# Patient Record
Sex: Female | Born: 1960 | Race: Black or African American | Hispanic: No | Marital: Single | Smoking: Former smoker
Health system: Southern US, Community
[De-identification: ages and names within clinical notes are randomized; demographics above are authoritative.]

## PROBLEM LIST (undated history)

## (undated) ENCOUNTER — Emergency Department: Discharge status: Absent without leave | Payer: Medicare Other

## (undated) DIAGNOSIS — F32A Depression, unspecified: Secondary | ICD-10-CM

## (undated) DIAGNOSIS — J40 Bronchitis, not specified as acute or chronic: Secondary | ICD-10-CM

## (undated) DIAGNOSIS — J449 Chronic obstructive pulmonary disease, unspecified: Secondary | ICD-10-CM

## (undated) DIAGNOSIS — F329 Major depressive disorder, single episode, unspecified: Secondary | ICD-10-CM

## (undated) DIAGNOSIS — H01009 Unspecified blepharitis unspecified eye, unspecified eyelid: Secondary | ICD-10-CM

## (undated) DIAGNOSIS — F17210 Nicotine dependence, cigarettes, uncomplicated: Secondary | ICD-10-CM

## (undated) DIAGNOSIS — IMO0001 Reserved for inherently not codable concepts without codable children: Secondary | ICD-10-CM

## (undated) DIAGNOSIS — M1990 Unspecified osteoarthritis, unspecified site: Secondary | ICD-10-CM

## (undated) DIAGNOSIS — J45909 Unspecified asthma, uncomplicated: Secondary | ICD-10-CM

## (undated) DIAGNOSIS — K219 Gastro-esophageal reflux disease without esophagitis: Secondary | ICD-10-CM

## (undated) DIAGNOSIS — B20 Human immunodeficiency virus [HIV] disease: Secondary | ICD-10-CM

## (undated) DIAGNOSIS — F40240 Claustrophobia: Secondary | ICD-10-CM

## (undated) DIAGNOSIS — O009 Unspecified ectopic pregnancy without intrauterine pregnancy: Secondary | ICD-10-CM

## (undated) DIAGNOSIS — R059 Cough, unspecified: Secondary | ICD-10-CM

## (undated) DIAGNOSIS — A6000 Herpesviral infection of urogenital system, unspecified: Secondary | ICD-10-CM

## (undated) DIAGNOSIS — J309 Allergic rhinitis, unspecified: Secondary | ICD-10-CM

## (undated) DIAGNOSIS — K922 Gastrointestinal hemorrhage, unspecified: Secondary | ICD-10-CM

## (undated) DIAGNOSIS — R202 Paresthesia of skin: Secondary | ICD-10-CM

## (undated) DIAGNOSIS — R519 Headache, unspecified: Secondary | ICD-10-CM

## (undated) DIAGNOSIS — Z21 Asymptomatic human immunodeficiency virus [HIV] infection status: Secondary | ICD-10-CM

## (undated) DIAGNOSIS — H5789 Other specified disorders of eye and adnexa: Secondary | ICD-10-CM

## (undated) DIAGNOSIS — M5136 Other intervertebral disc degeneration, lumbar region: Secondary | ICD-10-CM

## (undated) DIAGNOSIS — K5790 Diverticulosis of intestine, part unspecified, without perforation or abscess without bleeding: Secondary | ICD-10-CM

## (undated) DIAGNOSIS — I10 Essential (primary) hypertension: Secondary | ICD-10-CM

## (undated) DIAGNOSIS — R51 Headache: Secondary | ICD-10-CM

## (undated) DIAGNOSIS — R05 Cough: Secondary | ICD-10-CM

## (undated) HISTORY — DX: Unspecified asthma, uncomplicated: J45.909

## (undated) HISTORY — PX: JOINT REPLACEMENT: SHX530

## (undated) HISTORY — PX: LAPAROSCOPY FOR ECTOPIC PREGNANCY: SUR765

## (undated) HISTORY — DX: Other specified disorders of eye and adnexa: H57.89

## (undated) HISTORY — PX: HAND SURGERY: SHX662

## (undated) HISTORY — DX: Gastro-esophageal reflux disease without esophagitis: K21.9

## (undated) HISTORY — PX: BUNIONECTOMY: SHX129

## (undated) HISTORY — DX: Allergic rhinitis, unspecified: J30.9

## (undated) HISTORY — PX: DISTAL FEMUR OSTEOTOMY: SHX1467

## (undated) HISTORY — PX: OTHER SURGICAL HISTORY: SHX169

## (undated) HISTORY — DX: Human immunodeficiency virus (HIV) disease: B20

## (undated) HISTORY — DX: Asymptomatic human immunodeficiency virus (hiv) infection status: Z21

## (undated) HISTORY — DX: Essential (primary) hypertension: I10

## (undated) HISTORY — DX: Nicotine dependence, cigarettes, uncomplicated: F17.210

## (undated) HISTORY — PX: KNEE SURGERY: SHX244

## (undated) HISTORY — DX: Unspecified osteoarthritis, unspecified site: M19.90

## (undated) HISTORY — DX: Bronchitis, not specified as acute or chronic: J40

## (undated) HISTORY — PX: TUBAL LIGATION: SHX77

## (undated) HISTORY — DX: Cough, unspecified: R05.9

## (undated) HISTORY — DX: Herpesviral infection of urogenital system, unspecified: A60.00

## (undated) HISTORY — PX: DIAGNOSTIC LAPAROSCOPY: SUR761

## (undated) HISTORY — DX: Unspecified blepharitis unspecified eye, unspecified eyelid: H01.009

## (undated) HISTORY — DX: Cough: R05

## (undated) HISTORY — DX: Paresthesia of skin: R20.2

## (undated) NOTE — *Deleted (*Deleted)
Transition of Care [facility]) - CM/SW Discharge Note   Patient Details  Name: Melanie Bowen MRN: [id number] Date of Birth: 06-05-1961  Transition of Care [facility]) CM/SW Contact:  Victorino Dike, RN Phone Number: 06/12/2020, 10:44 AM   Clinical Narrative:      Patient to DC today to [facility].  Please call report to [phone].  Room #: [id number].  Transport by [facility] No further TOC needs.     Final next level of care: Skilled Nursing Facility Barriers to Discharge: Barriers Resolved   Patient Goals and CMS Choice Patient states their goals for this hospitalization and ongoing recovery are:: I need to go to SNF for rehab. CMS Medicare.gov Compare Post Acute Care list provided to:: Patient Choice offered to / list presented to : Patient  Discharge Placement              Patient chooses bed at: Other - please specify in the comment section below: ([facility]) Patient to be transferred to facility by: [facility] Name of family member notified: Patient and sister Patient and family notified of of transfer: 06/12/20  Discharge Plan and Services In-house Referral: Clinical Social Work Discharge Planning Services: CM Consult                                 Social Determinants of Health (SDOH) Interventions     Readmission Risk Interventions Readmission Risk Prevention Plan 05/23/2020 11/08/2019  Transportation Screening Complete Complete  Medication Review Press photographer) Complete Complete  PCP or Specialist appointment within 3-5 days of discharge (No Data) Not Complete  PCP/Specialist Appt Not Complete comments - Her PCP is out for 2-3 weeks so other two providers are booked out. The receptionist will call her if there is a cancellation.  [place] or Home Care Consult (No Data) Complete  SW Recovery Care/Counseling Consult Complete Complete  Palliative Care Screening Complete Not Applicable  Skilled Nursing Facility Complete Not Applicable  Some recent  data might be hidden

## (undated) NOTE — *Deleted (*Deleted)
Patient received in bed resting. Arousalible to verbal stimuli. MEWS is now

---

## 2002-12-31 HISTORY — PX: REVISION TOTAL KNEE ARTHROPLASTY: SUR1280

## 2003-10-29 ENCOUNTER — Other Ambulatory Visit: Payer: Self-pay

## 2004-12-31 ENCOUNTER — Emergency Department: Payer: Self-pay | Admitting: General Practice

## 2005-05-27 ENCOUNTER — Other Ambulatory Visit: Payer: Self-pay

## 2005-06-07 ENCOUNTER — Inpatient Hospital Stay: Payer: Self-pay | Admitting: General Practice

## 2005-08-26 ENCOUNTER — Encounter: Payer: Self-pay | Admitting: General Practice

## 2005-11-26 ENCOUNTER — Ambulatory Visit: Payer: Self-pay | Admitting: Infectious Diseases

## 2006-04-07 ENCOUNTER — Other Ambulatory Visit: Payer: Self-pay

## 2006-04-07 ENCOUNTER — Ambulatory Visit: Payer: Self-pay | Admitting: Podiatry

## 2006-04-13 ENCOUNTER — Ambulatory Visit: Payer: Self-pay | Admitting: Podiatry

## 2006-08-10 ENCOUNTER — Ambulatory Visit: Payer: Self-pay | Admitting: Infectious Diseases

## 2006-11-01 ENCOUNTER — Inpatient Hospital Stay: Payer: Self-pay | Admitting: Infectious Diseases

## 2006-11-01 ENCOUNTER — Other Ambulatory Visit: Payer: Self-pay

## 2007-01-10 ENCOUNTER — Ambulatory Visit: Payer: Self-pay | Admitting: Infectious Diseases

## 2007-03-04 ENCOUNTER — Emergency Department: Payer: Self-pay

## 2007-03-12 ENCOUNTER — Emergency Department: Payer: Self-pay | Admitting: Emergency Medicine

## 2007-05-24 ENCOUNTER — Ambulatory Visit: Payer: Self-pay | Admitting: Family Medicine

## 2007-06-12 ENCOUNTER — Ambulatory Visit: Payer: Self-pay | Admitting: Family Medicine

## 2007-11-14 ENCOUNTER — Ambulatory Visit: Payer: Self-pay | Admitting: Specialist

## 2008-02-07 ENCOUNTER — Ambulatory Visit: Payer: Self-pay | Admitting: Family Medicine

## 2008-02-08 ENCOUNTER — Ambulatory Visit: Payer: Self-pay | Admitting: Podiatry

## 2008-02-16 ENCOUNTER — Ambulatory Visit: Payer: Self-pay | Admitting: Podiatry

## 2008-06-29 ENCOUNTER — Emergency Department: Payer: Self-pay | Admitting: Emergency Medicine

## 2009-02-10 ENCOUNTER — Ambulatory Visit: Payer: Self-pay | Admitting: Specialist

## 2009-03-05 ENCOUNTER — Ambulatory Visit: Payer: Self-pay | Admitting: General Practice

## 2009-03-19 ENCOUNTER — Ambulatory Visit: Payer: Self-pay | Admitting: Specialist

## 2009-10-21 ENCOUNTER — Emergency Department: Payer: Self-pay | Admitting: Emergency Medicine

## 2010-01-03 ENCOUNTER — Emergency Department: Payer: Self-pay | Admitting: Internal Medicine

## 2010-03-03 ENCOUNTER — Emergency Department: Payer: Self-pay | Admitting: Emergency Medicine

## 2010-03-04 ENCOUNTER — Ambulatory Visit: Payer: Self-pay | Admitting: Family Medicine

## 2010-05-13 ENCOUNTER — Ambulatory Visit: Payer: Self-pay | Admitting: Internal Medicine

## 2010-06-14 ENCOUNTER — Emergency Department: Payer: Self-pay | Admitting: Emergency Medicine

## 2010-12-31 ENCOUNTER — Emergency Department: Payer: Self-pay | Admitting: Emergency Medicine

## 2011-04-08 ENCOUNTER — Ambulatory Visit: Payer: Self-pay | Admitting: Specialist

## 2011-07-07 ENCOUNTER — Ambulatory Visit: Payer: Self-pay | Admitting: Anesthesiology

## 2011-07-09 ENCOUNTER — Ambulatory Visit: Payer: Self-pay | Admitting: Unknown Physician Specialty

## 2011-09-24 ENCOUNTER — Ambulatory Visit: Payer: Self-pay | Admitting: Unknown Physician Specialty

## 2011-09-24 LAB — CREATININE, SERUM
Creatinine: 0.9 mg/dL (ref 0.60–1.30)
EGFR (African American): >60
EGFR (Non-African Amer.): >60

## 2011-09-30 ENCOUNTER — Ambulatory Visit: Payer: Self-pay | Admitting: Unknown Physician Specialty

## 2011-12-22 ENCOUNTER — Ambulatory Visit: Payer: Self-pay | Admitting: Unknown Physician Specialty

## 2011-12-22 LAB — POTASSIUM: Potassium: 3.9 mmol/L (ref 3.5–5.1)

## 2011-12-23 ENCOUNTER — Ambulatory Visit: Payer: Self-pay | Admitting: Unknown Physician Specialty

## 2011-12-23 LAB — PREGNANCY, URINE: Pregnancy Test, Urine: NEGATIVE m[IU]/mL

## 2012-04-25 ENCOUNTER — Ambulatory Visit: Payer: Self-pay | Admitting: Specialist

## 2012-06-02 ENCOUNTER — Ambulatory Visit: Payer: Self-pay | Admitting: General Practice

## 2012-08-08 ENCOUNTER — Ambulatory Visit: Payer: Self-pay | Admitting: Gastroenterology

## 2012-12-25 HISTORY — PX: BACK SURGERY: SHX140

## 2013-04-15 ENCOUNTER — Emergency Department: Payer: Self-pay | Admitting: Emergency Medicine

## 2013-04-15 LAB — COMPREHENSIVE METABOLIC PANEL
Albumin: 3.4 g/dL (ref 3.4–5.0)
Alkaline Phosphatase: 126 U/L (ref 50–136)
Anion Gap: 8 (ref 7–16)
BUN: 19 mg/dL — ABNORMAL HIGH (ref 7–18)
Bilirubin,Total: 0.2 mg/dL (ref 0.2–1.0)
Calcium, Total: 8.7 mg/dL (ref 8.5–10.1)
Chloride: 110 mmol/L — ABNORMAL HIGH (ref 98–107)
Co2: 22 mmol/L (ref 21–32)
Creatinine: 1.12 mg/dL (ref 0.60–1.30)
EGFR (African American): >60
EGFR (Non-African Amer.): 57 — ABNORMAL LOW
Glucose: 103 mg/dL — ABNORMAL HIGH (ref 65–99)
Osmolality: 282 (ref 275–301)
Potassium: 3.9 mmol/L (ref 3.5–5.1)
SGOT(AST): 23 U/L (ref 15–37)
SGPT (ALT): 23 U/L (ref 12–78)
Sodium: 140 mmol/L (ref 136–145)
Total Protein: 7.1 g/dL (ref 6.4–8.2)

## 2013-04-15 LAB — CK TOTAL AND CKMB (NOT AT ARMC)
CK, Total: 241 U/L — ABNORMAL HIGH (ref 21–215)
CK-MB: 1.3 ng/mL (ref 0.5–3.6)

## 2013-04-15 LAB — CBC
HCT: 40 % (ref 35.0–47.0)
HGB: 13.6 g/dL (ref 12.0–16.0)
MCH: 32.5 pg (ref 26.0–34.0)
MCHC: 34 g/dL (ref 32.0–36.0)
MCV: 96 fL (ref 80–100)
Platelet: 296 10*3/uL (ref 150–440)
RBC: 4.19 10*6/uL (ref 3.80–5.20)
RDW: 13.6 % (ref 11.5–14.5)
WBC: 7.8 10*3/uL (ref 3.6–11.0)

## 2013-04-15 LAB — TROPONIN I: Troponin-I: <0.02 ng/mL

## 2013-04-16 LAB — TROPONIN I: Troponin-I: <0.02 ng/mL

## 2013-04-26 ENCOUNTER — Ambulatory Visit: Payer: Self-pay | Admitting: Family Medicine

## 2013-08-25 ENCOUNTER — Emergency Department: Payer: Self-pay | Admitting: Emergency Medicine

## 2014-01-25 ENCOUNTER — Ambulatory Visit: Payer: Self-pay | Admitting: General Practice

## 2014-01-25 HISTORY — PX: FRACTURE SURGERY: SHX138

## 2014-03-01 ENCOUNTER — Emergency Department: Payer: Self-pay | Admitting: Emergency Medicine

## 2014-03-14 ENCOUNTER — Encounter: Payer: Self-pay | Admitting: Nurse Practitioner

## 2014-03-30 ENCOUNTER — Encounter: Payer: Self-pay | Admitting: Nurse Practitioner

## 2014-04-10 DIAGNOSIS — M51369 Other intervertebral disc degeneration, lumbar region without mention of lumbar back pain or lower extremity pain: Secondary | ICD-10-CM

## 2014-04-10 DIAGNOSIS — M5136 Other intervertebral disc degeneration, lumbar region: Secondary | ICD-10-CM

## 2014-04-10 HISTORY — DX: Other intervertebral disc degeneration, lumbar region: M51.36

## 2014-04-10 HISTORY — DX: Other intervertebral disc degeneration, lumbar region without mention of lumbar back pain or lower extremity pain: M51.369

## 2014-04-29 ENCOUNTER — Ambulatory Visit: Payer: Self-pay | Admitting: Family Medicine

## 2014-04-30 ENCOUNTER — Encounter: Payer: Self-pay | Admitting: Nurse Practitioner

## 2014-05-13 ENCOUNTER — Emergency Department: Payer: Self-pay | Admitting: Emergency Medicine

## 2014-05-19 ENCOUNTER — Emergency Department: Payer: Self-pay | Admitting: Internal Medicine

## 2014-05-19 LAB — URINALYSIS, COMPLETE
Bacteria: NONE SEEN
Bilirubin,UR: NEGATIVE
Glucose,UR: NEGATIVE mg/dL (ref 0–75)
Ketone: NEGATIVE
Leukocyte Esterase: NEGATIVE
Nitrite: NEGATIVE
Ph: 5 (ref 4.5–8.0)
Protein: NEGATIVE
RBC,UR: <1 /HPF (ref 0–5)
Specific Gravity: 1.02 (ref 1.003–1.030)
Squamous Epithelial: <1
WBC UR: <1 /HPF (ref 0–5)

## 2014-05-30 ENCOUNTER — Encounter: Payer: Self-pay | Admitting: Nurse Practitioner

## 2014-06-16 ENCOUNTER — Emergency Department: Payer: Self-pay | Admitting: Emergency Medicine

## 2014-07-14 ENCOUNTER — Emergency Department: Payer: Self-pay | Admitting: Emergency Medicine

## 2014-08-20 ENCOUNTER — Ambulatory Visit: Payer: Self-pay | Admitting: General Practice

## 2014-10-20 ENCOUNTER — Emergency Department: Payer: Self-pay | Admitting: Emergency Medicine

## 2014-10-30 ENCOUNTER — Institutional Professional Consult (permissible substitution): Payer: Self-pay | Admitting: Internal Medicine

## 2014-11-01 ENCOUNTER — Ambulatory Visit: Payer: Self-pay | Admitting: Gastroenterology

## 2014-11-12 ENCOUNTER — Encounter: Payer: Self-pay | Admitting: Internal Medicine

## 2014-11-12 ENCOUNTER — Ambulatory Visit (INDEPENDENT_AMBULATORY_CARE_PROVIDER_SITE_OTHER): Payer: Medicare Other | Admitting: Internal Medicine

## 2014-11-12 VITALS — BP 128/84 | HR 94 | Temp 97.9°F | Ht 66.0 in | Wt <= 1120 oz

## 2014-11-12 DIAGNOSIS — J4540 Moderate persistent asthma, uncomplicated: Secondary | ICD-10-CM

## 2014-11-12 DIAGNOSIS — J45909 Unspecified asthma, uncomplicated: Secondary | ICD-10-CM | POA: Insufficient documentation

## 2014-11-12 NOTE — Progress Notes (Signed)
Date: 11/12/2014  MRN# [id number] Melanie Bowen June 19, 1961  Referring Physician: FNP Dow Adolph is a 54 y.o. old female seen in consultation for cough\sob.  CC:  Chief Complaint  Patient presents with  . Advice Only    Asthma: wheezing; SOB at times, CP, chest tightness     HPI:  She is a pleasant 54 year old female past medical history of asthma, HIV, in consultation today for recurrent bronchitis/cough/orders of breath. Patient states that she's had recurrent upper respiratory tract infections over the past 2 months, this accompanied with cough (brown to clear productive sputum). Recently she went to the ED for visit due to cough and shortness of breath, she also had a chest x-ray at that time which was very 21st 2016, patient is getting treatment at that time due to being in the waiting room to long and left before treatment was given. Over the past months she's been using albuterol 2 times a day and nebulizer 2-3 times a day. Patient states that she is currently on Advair but she only uses it 3-4 days per week, she denies any intubation for respiratory issues in the past. States that she has about 4 episodes a year of "bronchitis" requiring steroids. She currently smokes half pack to a quarter pack per day for the past 40 years, she quits intermittently for months at a time. In the past she has used cocaine but currently denies any current use. No pets at home, previously worked in a [employer].   Review of records (personally reviewed by Dr. Edwin Dada time spent reviewing 30 minutes) Office visit 10/21/2014-recently completed antibiotics and prednisone 5 day course still feeling chest pressure, shortness of breath, wheezing, using inhalers daily, using nebulizers twice a day, no fever, still smoking. Plan prednisone taper, nicotine patch, referral to pulmonary. Office visit 10/15/2014-presented with complaints of cough, rhinorrhea, sinus pressure, right ear pain,  wheezing, body aches and malaise, purulent sputum, cough, shortness of breath, still smoking. Plan Levaquin, steroid burst, DuoNeb's U4 to 6 hours while awake for the next few days, continue Advair. Office visit 09/11/2014-complaints of cough, recently completed antibiotics and prednisone, still with some shortness of breath and wheezing, however this is improved, sputum is decrease. Plan quit smoking, humidify prescription, DuoNeb solution, start Advair.  PMHX:   Past Medical History  Diagnosis Date  . Asthma   . Cough   . Genital herpes   . HIV (human immunodeficiency virus infection)   . Bronchitis   . Arthritis   . GERD (gastroesophageal reflux disease)   . Eye irritation   . Paresthesia of hand, bilateral   . Blepharitis   . Allergic rhinitis   . Hypertension   . Cigarette smoker    Surgical Hx:  Past Surgical History  Procedure Laterality Date  . Knee surgery    . Hand surgery    . Tubal ligation    . Laparoscopy for ectopic pregnancy     Family Hx:  History reviewed. No pertinent family history. Social Hx:   History  Substance Use Topics  . Smoking status: Current Every Day Smoker    Types: Cigarettes  . Smokeless tobacco: Never Used  . Alcohol Use: 0.0 oz/week    0 Standard drinks or equivalent per week   Medication:   Current Outpatient Rx  Name  Route  Sig  Dispense  Refill  . albuterol (PROVENTIL HFA;VENTOLIN HFA) 108 (90 BASE) MCG/ACT inhaler   Inhalation   Inhale 90 mcg into the lungs  every 6 (six) hours.         Marland Kitchen amLODipine (NORVASC) 10 MG tablet   Oral   Take 10 mg by mouth daily.         Marland Kitchen efavirenz-emtricitabine-tenofovir (ATRIPLA) 600-200-300 MG per tablet   Oral   Take 1 tablet by mouth at bedtime.         Marland Kitchen etodolac (LODINE) 500 MG tablet   Oral   Take 500 mg by mouth 2 (two) times daily.         . fluticasone (FLONASE) 50 MCG/ACT nasal spray   Nasal   Place 50 g into the nose daily.         . Fluticasone-Salmeterol (ADVAIR)  500-50 MCG/DOSE AEPB   Inhalation   Inhale 500 mcg into the lungs every 4 (four) hours.         . hydrochlorothiazide (HYDRODIURIL) 25 MG tablet   Oral   Take 25 mg by mouth daily.         Marland Kitchen HYDROcodone-acetaminophen (NORCO) 10-325 MG per tablet   Oral   Take 1 tablet by mouth every 12 (twelve) hours as needed.      0   . ibuprofen (ADVIL,MOTRIN) 600 MG tablet   Oral   Take 600 mg by mouth as needed.         Marland Kitchen ipratropium-albuterol (DUONEB) 0.5-2.5 (3) MG/3ML SOLN   Inhalation   Inhale 3 mLs into the lungs 3 (three) times daily as needed.         Marland Kitchen losartan (COZAAR) 50 MG tablet   Oral   Take 50 mg by mouth daily.         Marland Kitchen omeprazole (PRILOSEC) 40 MG capsule   Oral   Take 40 mg by mouth daily.         . promethazine (PHENERGAN) 25 MG tablet   Oral   Take 25 mg by mouth as needed.         . valACYclovir (VALTREX) 1000 MG tablet   Oral   Take 1,000 mg by mouth daily as needed.             Allergies:  Gabapentin and Zanaflex   Review of Systems: Gen:  Denies  fever, sweats, chills HEENT: Denies blurred vision, double vision, ear pain, eye pain, hearing loss, nose bleeds, sore throat Cvc:  No dizziness, chest pain or heaviness Resp:   Denies cough or sputum porduction, shortness of breath Gi: Denies swallowing difficulty, stomach pain, nausea or vomiting, diarrhea, constipation, bowel incontinence Gu:  Denies bladder incontinence, burning urine Ext:   No Joint pain, stiffness or swelling Skin: No skin rash, easy bruising or bleeding or hives Endoc:  No polyuria, polydipsia , polyphagia or weight change Psych: No depression, insomnia or hallucinations  Other:  All other systems negative  Physical Examination:   VS: BP 128/84 mmHg  Pulse 94  Temp(Src) 97.9 F (36.6 C) (Oral)  Ht 5\' 6"  (1.676 m)  Wt 27 lb (12.247 kg)  BMI 4.36 kg/m2  SpO2 96%  General Appearance: No distress  Neuro:without focal findings, mental status, speech normal,  alert and oriented, cranial nerves 2-12 intact, reflexes normal and symmetric, sensation grossly normal  HEENT: PERRLA, EOM intact, no ptosis, no other lesions noticed; Mallampati 2 Pulmonary: Coarse upper airway sounds, decreased breath sounds at the bilateral bases, diaphragmatic excursion normal.No wheezing, No rales;   Sputum Production:  None  CardiovascularNormal S1,S2.  No m/r/g.  Abdominal aorta pulsation normal.  Abdomen: Benign, Soft, non-tender, No masses, hepatosplenomegaly, No lymphadenopathy Renal:  No costovertebral tenderness  GU:  No performed at this time. Endoc: No evident thyromegaly, no signs of acromegaly or Cushing features Skin:   warm, no rashes, no ecchymosis  Extremities: normal, no cyanosis, clubbing, no edema, warm with normal capillary refill. Other findings: None   Rad results: (The following images and results were reviewed by Dr. Stevenson Clinch). CXR 10/20/14 FINDINGS: The cardiac silhouette, mediastinal and hilar contours are normal and stable. Mild bronchitic changes with slight peribronchial thickening and slight increased interstitial markings may suggest bronchitis or reactive airways disease. No infiltrates or effusions. The bony thorax is intact.  IMPRESSION: Mild bronchitic changes could suggest bronchitis or interstitial pneumonitis. No focal infiltrates.  CXR 06/16/14 COMPARISON: 05/13/2014  FINDINGS: The heart size and mediastinal contours are within normal limits. Both lungs are clear. The visualized skeletal structures are unremarkable.  IMPRESSION: No active cardiopulmonary disease.  Assessment and Plan:  54 year old female past medical history of asthma, HIV, tobacco abuse, seen in consultation for recurrent upper respiratory tract infections and cough. Asthma, chronic Currently her asthma is not controlled well, she is requiring multiple rounds of antibiotics and steroids.  I believe that her continued smoking is triggering bronchospasms  along with recurrent breast 3 tract infections. Today I counseled the patient heavily on quit tobacco and current effects it can have on her current respiratory status.  Also today, consultation heavily on need and use of a rescue nebulizer and rescue inhaler along with his current indications and administrations. She does have a history of HIV, currently following with infectious disease, has not seen an infectious disease in over 1 year.  Plan: - pulmonary function testing and 6 minute walk test prior to follow up - albuterol inhaler - 2puff every 3-4 hours as needed for shortness of breath\wheezing\recurrent cough - continue Advair Diskus - 1 puff twice a day (rinse and gargle after each use). - If patient continues to have a upper respiratory tract infections, bronchitis, will need to consider bronchoscopy with BAL, this can be discussed at her follow-up visit     Updated Medication List Outpatient Encounter Prescriptions as of 11/12/2014  Medication Sig  . albuterol (PROVENTIL HFA;VENTOLIN HFA) 108 (90 BASE) MCG/ACT inhaler Inhale 90 mcg into the lungs every 6 (six) hours.  Marland Kitchen amLODipine (NORVASC) 10 MG tablet Take 10 mg by mouth daily.  Marland Kitchen efavirenz-emtricitabine-tenofovir (ATRIPLA) 600-200-300 MG per tablet Take 1 tablet by mouth at bedtime.  Marland Kitchen etodolac (LODINE) 500 MG tablet Take 500 mg by mouth 2 (two) times daily.  . fluticasone (FLONASE) 50 MCG/ACT nasal spray Place 50 g into the nose daily.  . Fluticasone-Salmeterol (ADVAIR) 500-50 MCG/DOSE AEPB Inhale 500 mcg into the lungs every 4 (four) hours.  . hydrochlorothiazide (HYDRODIURIL) 25 MG tablet Take 25 mg by mouth daily.  Marland Kitchen HYDROcodone-acetaminophen (NORCO) 10-325 MG per tablet Take 1 tablet by mouth every 12 (twelve) hours as needed.  Marland Kitchen ibuprofen (ADVIL,MOTRIN) 600 MG tablet Take 600 mg by mouth as needed.  Marland Kitchen ipratropium-albuterol (DUONEB) 0.5-2.5 (3) MG/3ML SOLN Inhale 3 mLs into the lungs 3 (three) times daily as needed.  Marland Kitchen  losartan (COZAAR) 50 MG tablet Take 50 mg by mouth daily.  Marland Kitchen omeprazole (PRILOSEC) 40 MG capsule Take 40 mg by mouth daily.  . promethazine (PHENERGAN) 25 MG tablet Take 25 mg by mouth as needed.  . valACYclovir (VALTREX) 1000 MG tablet Take 1,000 mg by mouth daily as needed.    Orders for  this visit: No orders of the defined types were placed in this encounter.     Thank  you for the consultation and for allowing [facility] to assist in the care of your patient. Our recommendations are noted above.  Please contact us if we can be of further service.   Vilinda Boehringer, MD [facility] Office Number: [phone]

## 2014-11-12 NOTE — Patient Instructions (Signed)
Follow up with Dr. Stevenson Clinch in 1 month - pulmonary function testing and 6 minute walk test prior to follow up - albuterol inhaler - 2puff every 3-4 hours as needed for shortness of breath\wheezing\recurrent cough - continue Advair Diskus - 1 puff twice a day (rinse and gargle after each use).

## 2014-11-19 ENCOUNTER — Other Ambulatory Visit: Payer: Self-pay | Admitting: Neurosurgery

## 2014-11-26 NOTE — Assessment & Plan Note (Signed)
Currently her asthma is not controlled well, she is requiring multiple rounds of antibiotics and steroids.  I believe that her continued smoking is triggering bronchospasms along with recurrent breast 3 tract infections. Today I counseled the patient heavily on quit tobacco and current effects it can have on her current respiratory status.  Also today, consultation heavily on need and use of a rescue nebulizer and rescue inhaler along with his current indications and administrations. She does have a history of HIV, currently following with infectious disease, has not seen an infectious disease in over 1 year.  Plan: - pulmonary function testing and 6 minute walk test prior to follow up - albuterol inhaler - 2puff every 3-4 hours as needed for shortness of breath\wheezing\recurrent cough - continue Advair Diskus - 1 puff twice a day (rinse and gargle after each use). - If patient continues to have a upper respiratory tract infections, bronchitis, will need to consider bronchoscopy with BAL, this can be discussed at her follow-up visit

## 2014-12-06 ENCOUNTER — Ambulatory Visit
Admit: 2014-12-06 | Disposition: A | Discharge status: Home / Self Care | Payer: Self-pay | Attending: Gastroenterology | Admitting: Gastroenterology

## 2014-12-06 HISTORY — PX: COLONOSCOPY: SHX174

## 2014-12-14 ENCOUNTER — Emergency Department
Admit: 2014-12-14 | Disposition: A | Discharge status: Home / Self Care | Payer: Self-pay | Admitting: Emergency Medicine

## 2014-12-14 LAB — CBC WITH DIFFERENTIAL/PLATELET
Basophil #: 0.1 10*3/uL (ref 0.0–0.1)
Basophil %: 1.1 %
Eosinophil #: 0.7 10*3/uL (ref 0.0–0.7)
Eosinophil %: 9.5 %
HCT: 41.7 % (ref 35.0–47.0)
HGB: 13.9 g/dL (ref 12.0–16.0)
Lymphocyte #: 2.3 10*3/uL (ref 1.0–3.6)
Lymphocyte %: 29.9 %
MCH: 33.3 pg (ref 26.0–34.0)
MCHC: 33.4 g/dL (ref 32.0–36.0)
MCV: 100 fL (ref 80–100)
Monocyte #: 0.8 x10 3/mm (ref 0.2–0.9)
Monocyte %: 9.6 %
Neutrophil #: 3.9 10*3/uL (ref 1.4–6.5)
Neutrophil %: 49.9 %
Platelet: 350 10*3/uL (ref 150–440)
RBC: 4.18 10*6/uL (ref 3.80–5.20)
RDW: 13.6 % (ref 11.5–14.5)
WBC: 7.8 10*3/uL (ref 3.6–11.0)

## 2014-12-14 LAB — COMPREHENSIVE METABOLIC PANEL
Albumin: 3.7 g/dL
Alkaline Phosphatase: 116 U/L
Anion Gap: 8 (ref 7–16)
BUN: 19 mg/dL
Bilirubin,Total: 0.3 mg/dL
Calcium, Total: 8.8 mg/dL — ABNORMAL LOW
Chloride: 109 mmol/L
Co2: 24 mmol/L
Creatinine: 1.35 mg/dL — ABNORMAL HIGH
EGFR (African American): 52 — ABNORMAL LOW
EGFR (Non-African Amer.): 45 — ABNORMAL LOW
Glucose: 112 mg/dL — ABNORMAL HIGH
Potassium: 4 mmol/L
SGOT(AST): 31 U/L
SGPT (ALT): 22 U/L
Sodium: 141 mmol/L
Total Protein: 7 g/dL

## 2014-12-14 LAB — TROPONIN I: Troponin-I: <0.03 ng/mL

## 2014-12-20 ENCOUNTER — Encounter (HOSPITAL_COMMUNITY)
Admission: RE | Admit: 2014-12-20 | Discharge: 2014-12-20 | Disposition: A | Discharge status: Home / Self Care | Payer: Medicare Other | Source: Ambulatory Visit | Attending: Neurosurgery | Admitting: Neurosurgery

## 2014-12-20 ENCOUNTER — Encounter (HOSPITAL_COMMUNITY): Payer: Self-pay

## 2014-12-20 DIAGNOSIS — Z01818 Encounter for other preprocedural examination: Secondary | ICD-10-CM | POA: Diagnosis present

## 2014-12-20 HISTORY — DX: Depression, unspecified: F32.A

## 2014-12-20 HISTORY — DX: Reserved for inherently not codable concepts without codable children: IMO0001

## 2014-12-20 HISTORY — DX: Headache, unspecified: R51.9

## 2014-12-20 HISTORY — DX: Headache: R51

## 2014-12-20 HISTORY — DX: Major depressive disorder, single episode, unspecified: F32.9

## 2014-12-20 LAB — CBC WITH DIFFERENTIAL/PLATELET
Basophils Absolute: 0.1 10*3/uL (ref 0.0–0.1)
Basophils Relative: 0 % (ref 0–1)
Eosinophils Absolute: 0.4 10*3/uL (ref 0.0–0.7)
Eosinophils Relative: 3 % (ref 0–5)
HCT: 42.9 % (ref 36.0–46.0)
Hemoglobin: 14.3 g/dL (ref 12.0–15.0)
Lymphocytes Relative: 30 % (ref 12–46)
Lymphs Abs: 4 10*3/uL (ref 0.7–4.0)
MCH: 32.9 pg (ref 26.0–34.0)
MCHC: 33.3 g/dL (ref 30.0–36.0)
MCV: 98.8 fL (ref 78.0–100.0)
Monocytes Absolute: 1.1 10*3/uL — ABNORMAL HIGH (ref 0.1–1.0)
Monocytes Relative: 8 % (ref 3–12)
Neutro Abs: 7.9 10*3/uL — ABNORMAL HIGH (ref 1.7–7.7)
Neutrophils Relative %: 59 % (ref 43–77)
Platelets: 350 10*3/uL (ref 150–400)
RBC: 4.34 MIL/uL (ref 3.87–5.11)
RDW: 13.8 % (ref 11.5–15.5)
WBC: 13.5 10*3/uL — ABNORMAL HIGH (ref 4.0–10.5)

## 2014-12-20 LAB — BASIC METABOLIC PANEL
Anion gap: 10 (ref 5–15)
BUN: 15 mg/dL (ref 6–23)
CO2: 24 mmol/L (ref 19–32)
Calcium: 8.9 mg/dL (ref 8.4–10.5)
Chloride: 102 mmol/L (ref 96–112)
Creatinine, Ser: 1.19 mg/dL — ABNORMAL HIGH (ref 0.50–1.10)
GFR calc Af Amer: 60 mL/min — ABNORMAL LOW (ref >90)
GFR calc non Af Amer: 52 mL/min — ABNORMAL LOW (ref >90)
Glucose, Bld: 96 mg/dL (ref 70–99)
Potassium: 3.7 mmol/L (ref 3.5–5.1)
Sodium: 136 mmol/L (ref 135–145)

## 2014-12-20 LAB — SURGICAL PCR SCREEN
MRSA, PCR: NEGATIVE
Staphylococcus aureus: NEGATIVE

## 2014-12-20 LAB — ABO/RH: ABO/RH(D): O POS

## 2014-12-20 LAB — TYPE AND SCREEN
ABO/RH(D): O POS
Antibody Screen: NEGATIVE

## 2014-12-20 NOTE — Pre-Procedure Instructions (Signed)
TALISSA LAHTI  12/20/2014   Your procedure is scheduled on:  Tuesday, May 3.  Report to [facility] Admitting at 6:00 AM.   Call this number if you have problems the morning of surgery: [phone]              For any other questions, please call [phone], Monday - Friday 8 AM - 4 PM.   Remember:   Do not eat food or drink liquids after midnight Monday, May 2.   Take these medicines the morning of surgery with A SIP OF WATER: amLODipine (NORVASC), omeprazole (PRILOSEC).  Use Fluticasone-Salmeterol (ADVAIR).                Take if needed: valACYclovir (VALTREX), promethazine (PHENERGAN),  HYDROcodone-acetaminophen (NORCO). Use nasal and rescue inhalers as needed and please bring rescue inhalers to the hospital with you.   Do not wear jewelry, make-up or nail polish.  Do not wear lotions, powders, or perfumes.   Do not shave 48 hours prior to surgery.   Do not bring valuables to the hospital.             [facility] is not responsible for any belongings or valuables.               Contacts, dentures or bridgework may not be worn into surgery.  Leave suitcase in the car. After surgery it may be brought to your room.  For patients admitted to the hospital, discharge time is determined by your treatment team.                Special Instructions: Review  [place] - Preparing For Surgery.   Please read over the following fact sheets that you were given: Pain Booklet, Coughing and Deep Breathing, Blood Transfusion Information and Surgical Site Infection Prevention

## 2014-12-20 NOTE — Progress Notes (Signed)
Melanie Bowen was seen at [facility] on Sat 12/14/14, with complaint of right sided chest pain and cough.  Pain is 8 radiates from mid right back to right breast.  States it is a sharp pain.  Patient states that she was diagnosed with Acute Bronchitis.  Patient was treated with Prednisone dose pack and 3 days of Antibiotics.   Patient is scheduled to have a "lung study"  at [facility] 12/26/14.Patient did report that coughing has decreased, but pain is the same.  I have requested information from [facility], ED visit and [facility] for EKGs, any cardiac studies, chest xrays and last office visit.

## 2014-12-21 ENCOUNTER — Emergency Department
Admit: 2014-12-21 | Disposition: A | Discharge status: Home / Self Care | Payer: Self-pay | Admitting: Emergency Medicine

## 2014-12-21 LAB — BASIC METABOLIC PANEL
Anion Gap: 7 (ref 7–16)
BUN: 16 mg/dL
Calcium, Total: 8.4 mg/dL — ABNORMAL LOW
Chloride: 108 mmol/L
Co2: 24 mmol/L
Creatinine: 0.96 mg/dL
EGFR (African American): >60
EGFR (Non-African Amer.): >60
Glucose: 85 mg/dL
Potassium: 3.6 mmol/L
Sodium: 139 mmol/L

## 2014-12-21 LAB — CBC
HCT: 42.7 % (ref 35.0–47.0)
HGB: 14.2 g/dL (ref 12.0–16.0)
MCH: 33.1 pg (ref 26.0–34.0)
MCHC: 33.2 g/dL (ref 32.0–36.0)
MCV: 100 fL (ref 80–100)
Platelet: 330 10*3/uL (ref 150–440)
RBC: 4.29 10*6/uL (ref 3.80–5.20)
RDW: 14 % (ref 11.5–14.5)
WBC: 7.7 10*3/uL (ref 3.6–11.0)

## 2014-12-21 LAB — TROPONIN I: Troponin-I: <0.03 ng/mL

## 2014-12-21 NOTE — Op Note (Signed)
PATIENT NAME:  Melanie Bowen, Melanie Bowen MR#:  [id number] DATE OF BIRTH:  27-Sep-1960  DATE OF PROCEDURE:  01/25/2014  PREOPERATIVE DIAGNOSIS:  Fracture of the base of the right little metacarpal.   POSTOPERATIVE DIAGNOSIS:  Fracture of the base of the right little metacarpal.   PROCEDURE PERFORMED:  Closed reduction and percutaneous pinning of the fracture of the base of the right little metacarpal.   SURGEON:  Skip Estimable, M.D.   ANESTHESIA:  General.   ESTIMATED BLOOD LOSS:  Minimal.   TOURNIQUET TIME:  Not used.   DRAINS:  None.   IMPLANTS UTILIZED:  1.2 mm K wires x 2.   INDICATIONS FOR SURGERY:  The patient is a 54 year old female who has been seen for complaints of right hand pain.  X-ray demonstrated fracture of the base of the right fifth metacarpal with displacement.  After discussion of the risks and benefits of surgical intervention, the patient expressed understanding of the risks, benefits, and agreed with plans for surgical intervention.   PROCEDURE IN DETAIL:  The patient was brought into the Operating Room and, after adequate general anesthesia was achieved, a tourniquet was placed on the patient's upper right arm.  The patient's right hand and arm were cleaned and prepped with alcohol and DuraPrep draped in the usual sterile fashion.  A "timeout" was performed as per usual protocol.  The fracture was attempted to be reduced manually, but there was still slight offset noted.  A Synthes bone reduction forceps with points was positioned through percutaneous incisions so as to reduce the fracture and provisionally stabilize the fracture site.  Good position was noted in AP and lateral planes.  A K wire was inserted through the shaft of the little metacarpal transversely and then engaged into the ring metacarpal for provisional distal stability.  A second K wire was inserted from the base of the little metacarpal and advanced distally across the fracture site so as to engage the proximal  and distal portions of the fracture site.  The bone reduction forceps was removed and good maintenance of the reduction was appreciated in multiple views using the FluoroScan.  The K wires were cut using wire cutters and Jurgens balls were placed so as to protect the tips of the K wires.  A well-padded dressing was applied followed by application of an ulnar gutter splint.   The patient tolerated the procedure well.  She was transported to the recovery room in stable condition.     ____________________________ Laurice Record. Holley Bouche., MD jph:ea D: 01/26/2014 14:31:06 ET T: 01/27/2014 00:40:38 ET JOB#: [id number]  cc: Laurice Record. Holley Bouche., MD, <Dictator> JAMES P Holley Bouche MD ELECTRONICALLY SIGNED 01/27/2014 8:52

## 2014-12-23 LAB — SURGICAL PATHOLOGY

## 2014-12-23 NOTE — Progress Notes (Addendum)
Anesthesia Chart Review:  Pt is 54 year old female scheduled for L3-4, L4-5 PLIF on 12/31/2014 with Dr. Annette Stable.   PMH includes: HTN, HIV, asthma. Current smoker. BMI 44.5.   Seen in ER at [facility] on 12/14/2014 for R sided chest pain and cough. Dx with bronchitis, tx with prednisone and z-pak.   Pt seen again in ER at [facility] on 12/21/14 for R sided chest pain. Diagnosed with chest wall pain. Given ibuprofen.   Chest x-ray reviewed. No acute cardiopulmonary disease.   EKG 12/14/2014 from ER visit: Sinus tachycardia (101 bpm). Septal infarct, age undetermined. Appears unchanged when compared to tracing dated 01/04/2014.   Had another EKG 12/21/14 at 2nd ER visit: NSR, septal infarct, age undetermined. Copy quality is poor, but other than decrease in rate, appears unchanged from 12/14/14.   If no changes, I anticipate pt can proceed with surgery as scheduled.   Mykenzi Strebe, FNP-BC [facility] Short Stay Surgical Center/Anesthesiology Phone: [phone] 12/24/2014 1:33 PM

## 2014-12-26 ENCOUNTER — Telehealth: Payer: Self-pay | Admitting: *Deleted

## 2014-12-26 ENCOUNTER — Ambulatory Visit: Payer: Medicare Other

## 2014-12-26 ENCOUNTER — Ambulatory Visit: Payer: Medicare Other | Admitting: Internal Medicine

## 2014-12-26 ENCOUNTER — Encounter: Payer: Self-pay | Admitting: *Deleted

## 2014-12-26 DIAGNOSIS — J4540 Moderate persistent asthma, uncomplicated: Secondary | ICD-10-CM

## 2014-12-26 NOTE — Telephone Encounter (Signed)
PFT order placed

## 2014-12-29 HISTORY — PX: BACK SURGERY: SHX140

## 2014-12-30 MED ORDER — DEXAMETHASONE SODIUM PHOSPHATE 10 MG/ML IJ SOLN
10.0000 mg | INTRAMUSCULAR | Status: AC
  Administered 2014-12-31: 10 mg via INTRAVENOUS
  Filled 2014-12-30: qty 1

## 2014-12-30 MED ORDER — CEFAZOLIN SODIUM-DEXTROSE 2-3 GM-% IV SOLR
2.0000 g | INTRAVENOUS | Status: AC
  Administered 2014-12-31: 2 g via INTRAVENOUS
  Filled 2014-12-30: qty 50

## 2014-12-31 ENCOUNTER — Inpatient Hospital Stay (HOSPITAL_COMMUNITY): Payer: Medicare Other

## 2014-12-31 ENCOUNTER — Encounter (HOSPITAL_COMMUNITY): Payer: Self-pay | Admitting: Anesthesiology

## 2014-12-31 ENCOUNTER — Inpatient Hospital Stay (HOSPITAL_COMMUNITY)
Admission: RE | Admit: 2014-12-31 | Discharge: 2015-01-02 | DRG: 460 | Disposition: A | Discharge status: Home Health | Payer: Medicare Other | Source: Ambulatory Visit | Attending: Neurosurgery | Admitting: Neurosurgery

## 2014-12-31 ENCOUNTER — Inpatient Hospital Stay (HOSPITAL_COMMUNITY): Payer: Medicare Other | Admitting: Vascular Surgery

## 2014-12-31 ENCOUNTER — Inpatient Hospital Stay (HOSPITAL_COMMUNITY): Payer: Medicare Other | Admitting: Anesthesiology

## 2014-12-31 ENCOUNTER — Encounter (HOSPITAL_COMMUNITY)
Admission: RE | Disposition: A | Discharge status: Home Health | Payer: Self-pay | Source: Ambulatory Visit | Attending: Neurosurgery

## 2014-12-31 DIAGNOSIS — F17210 Nicotine dependence, cigarettes, uncomplicated: Secondary | ICD-10-CM | POA: Diagnosis present

## 2014-12-31 DIAGNOSIS — K219 Gastro-esophageal reflux disease without esophagitis: Secondary | ICD-10-CM | POA: Diagnosis present

## 2014-12-31 DIAGNOSIS — Z6841 Body Mass Index (BMI) 40.0 and over, adult: Secondary | ICD-10-CM | POA: Diagnosis not present

## 2014-12-31 DIAGNOSIS — Z7951 Long term (current) use of inhaled steroids: Secondary | ICD-10-CM | POA: Diagnosis not present

## 2014-12-31 DIAGNOSIS — Z96653 Presence of artificial knee joint, bilateral: Secondary | ICD-10-CM | POA: Diagnosis present

## 2014-12-31 DIAGNOSIS — I10 Essential (primary) hypertension: Secondary | ICD-10-CM | POA: Diagnosis present

## 2014-12-31 DIAGNOSIS — Z21 Asymptomatic human immunodeficiency virus [HIV] infection status: Secondary | ICD-10-CM | POA: Diagnosis present

## 2014-12-31 DIAGNOSIS — M4310 Spondylolisthesis, site unspecified: Secondary | ICD-10-CM | POA: Diagnosis present

## 2014-12-31 DIAGNOSIS — A6000 Herpesviral infection of urogenital system, unspecified: Secondary | ICD-10-CM | POA: Diagnosis present

## 2014-12-31 DIAGNOSIS — M51369 Other intervertebral disc degeneration, lumbar region without mention of lumbar back pain or lower extremity pain: Secondary | ICD-10-CM | POA: Diagnosis present

## 2014-12-31 DIAGNOSIS — M5116 Intervertebral disc disorders with radiculopathy, lumbar region: Principal | ICD-10-CM | POA: Diagnosis present

## 2014-12-31 DIAGNOSIS — J45909 Unspecified asthma, uncomplicated: Secondary | ICD-10-CM | POA: Diagnosis present

## 2014-12-31 DIAGNOSIS — J309 Allergic rhinitis, unspecified: Secondary | ICD-10-CM | POA: Diagnosis present

## 2014-12-31 DIAGNOSIS — Z888 Allergy status to other drugs, medicaments and biological substances status: Secondary | ICD-10-CM

## 2014-12-31 DIAGNOSIS — M5136 Other intervertebral disc degeneration, lumbar region: Secondary | ICD-10-CM | POA: Diagnosis present

## 2014-12-31 DIAGNOSIS — M4806 Spinal stenosis, lumbar region: Secondary | ICD-10-CM | POA: Diagnosis present

## 2014-12-31 SURGERY — POSTERIOR LUMBAR FUSION 2 LEVEL
Anesthesia: General | Site: Spine Lumbar

## 2014-12-31 MED ORDER — ARTIFICIAL TEARS OP OINT
TOPICAL_OINTMENT | OPHTHALMIC | Status: AC
  Filled 2014-12-31: qty 3.5

## 2014-12-31 MED ORDER — 0.9 % SODIUM CHLORIDE (POUR BTL) OPTIME
TOPICAL | Status: DC | PRN
  Administered 2014-12-31: 1000 mL

## 2014-12-31 MED ORDER — LIDOCAINE HCL (CARDIAC) 20 MG/ML IV SOLN
INTRAVENOUS | Status: AC
  Filled 2014-12-31: qty 5

## 2014-12-31 MED ORDER — DOCUSATE SODIUM 100 MG PO CAPS
100.0000 mg | ORAL_CAPSULE | Freq: Two times a day (BID) | ORAL | Status: DC
  Administered 2014-12-31 – 2015-01-01 (×4): 100 mg via ORAL
  Filled 2014-12-31 (×4): qty 1

## 2014-12-31 MED ORDER — SODIUM CHLORIDE 0.9 % IJ SOLN
INTRAMUSCULAR | Status: AC
  Filled 2014-12-31: qty 10

## 2014-12-31 MED ORDER — HYDROMORPHONE HCL 1 MG/ML IJ SOLN
INTRAMUSCULAR | Status: AC
  Administered 2014-12-31: 0.5 mg via INTRAVENOUS
  Filled 2014-12-31: qty 1

## 2014-12-31 MED ORDER — PROMETHAZINE HCL 25 MG/ML IJ SOLN: 6.2500 mg | INTRAMUSCULAR | Status: DC | PRN

## 2014-12-31 MED ORDER — DIPHENHYDRAMINE HCL 50 MG/ML IJ SOLN
INTRAMUSCULAR | Status: DC | PRN
  Administered 2014-12-31: 12.5 mg via INTRAVENOUS

## 2014-12-31 MED ORDER — ALBUMIN HUMAN 5 % IV SOLN
INTRAVENOUS | Status: DC | PRN
  Administered 2014-12-31 (×2): via INTRAVENOUS

## 2014-12-31 MED ORDER — MIDAZOLAM HCL 5 MG/5ML IJ SOLN
INTRAMUSCULAR | Status: DC | PRN
  Administered 2014-12-31: 2 mg via INTRAVENOUS

## 2014-12-31 MED ORDER — EPHEDRINE SULFATE 50 MG/ML IJ SOLN
INTRAMUSCULAR | Status: AC
  Filled 2014-12-31: qty 1

## 2014-12-31 MED ORDER — EFAVIRENZ-EMTRICITAB-TENOFOVIR 600-200-300 MG PO TABS
1.0000 | ORAL_TABLET | Freq: Every day | ORAL | Status: DC
  Administered 2014-12-31: 1 via ORAL
  Filled 2014-12-31 (×3): qty 1

## 2014-12-31 MED ORDER — THROMBIN 20000 UNITS EX SOLR
CUTANEOUS | Status: DC | PRN
  Administered 2014-12-31 (×2): via TOPICAL

## 2014-12-31 MED ORDER — IPRATROPIUM-ALBUTEROL 0.5-2.5 (3) MG/3ML IN SOLN: 3.0000 mL | Freq: Three times a day (TID) | RESPIRATORY_TRACT | Status: DC | PRN

## 2014-12-31 MED ORDER — LACTATED RINGERS IV SOLN
INTRAVENOUS | Status: DC | PRN
  Administered 2014-12-31 (×3): via INTRAVENOUS

## 2014-12-31 MED ORDER — PHENYLEPHRINE HCL 10 MG/ML IJ SOLN
INTRAMUSCULAR | Status: DC | PRN
  Administered 2014-12-31: 80 ug via INTRAVENOUS
  Administered 2014-12-31 (×3): 40 ug via INTRAVENOUS

## 2014-12-31 MED ORDER — ROCURONIUM BROMIDE 50 MG/5ML IV SOLN
INTRAVENOUS | Status: AC
  Filled 2014-12-31: qty 1

## 2014-12-31 MED ORDER — AMLODIPINE BESYLATE 10 MG PO TABS
10.0000 mg | ORAL_TABLET | Freq: Every day | ORAL | Status: DC
Start: 2015-01-01 — End: 2015-01-02
  Filled 2014-12-31 (×2): qty 1

## 2014-12-31 MED ORDER — VANCOMYCIN HCL 1000 MG IV SOLR
INTRAVENOUS | Status: DC | PRN
  Administered 2014-12-31: 1000 mg

## 2014-12-31 MED ORDER — OXYCODONE-ACETAMINOPHEN 5-325 MG PO TABS
1.0000 | ORAL_TABLET | ORAL | Status: DC | PRN
  Administered 2014-12-31 – 2015-01-02 (×9): 2 via ORAL
  Filled 2014-12-31 (×9): qty 2

## 2014-12-31 MED ORDER — ONDANSETRON HCL 4 MG/2ML IJ SOLN: 4.0000 mg | INTRAMUSCULAR | Status: DC | PRN

## 2014-12-31 MED ORDER — DIAZEPAM 5 MG PO TABS
5.0000 mg | ORAL_TABLET | Freq: Four times a day (QID) | ORAL | Status: DC | PRN
  Administered 2014-12-31: 5 mg via ORAL
  Administered 2014-12-31 – 2015-01-01 (×3): 10 mg via ORAL
  Administered 2015-01-01: 5 mg via ORAL
  Administered 2015-01-01 – 2015-01-02 (×2): 10 mg via ORAL
  Filled 2014-12-31: qty 1
  Filled 2014-12-31 (×4): qty 2
  Filled 2014-12-31: qty 1
  Filled 2014-12-31: qty 2

## 2014-12-31 MED ORDER — ACETAMINOPHEN 325 MG PO TABS: 650.0000 mg | ORAL_TABLET | ORAL | Status: DC | PRN

## 2014-12-31 MED ORDER — SODIUM CHLORIDE 0.9 % IV SOLN: 250.0000 mL | INTRAVENOUS | Status: DC

## 2014-12-31 MED ORDER — PROMETHAZINE HCL 25 MG PO TABS
25.0000 mg | ORAL_TABLET | Freq: Four times a day (QID) | ORAL | Status: DC | PRN
  Administered 2015-01-01: 25 mg via ORAL
  Filled 2014-12-31: qty 1

## 2014-12-31 MED ORDER — VANCOMYCIN HCL 1000 MG IV SOLR
INTRAVENOUS | Status: AC
Start: 2014-12-31 — End: 2014-12-31
  Filled 2014-12-31: qty 1000

## 2014-12-31 MED ORDER — SUCCINYLCHOLINE CHLORIDE 20 MG/ML IJ SOLN
INTRAMUSCULAR | Status: AC
  Filled 2014-12-31: qty 1

## 2014-12-31 MED ORDER — POLYETHYLENE GLYCOL 3350 17 G PO PACK
17.0000 g | PACK | Freq: Every day | ORAL | Status: DC | PRN
  Filled 2014-12-31: qty 1

## 2014-12-31 MED ORDER — FLUTICASONE PROPIONATE 50 MCG/ACT NA SUSP: 1000000.0000 | Freq: Every day | NASAL | Status: DC | PRN

## 2014-12-31 MED ORDER — MENTHOL 3 MG MT LOZG: 1.0000 | LOZENGE | OROMUCOSAL | Status: DC | PRN

## 2014-12-31 MED ORDER — LIDOCAINE HCL (CARDIAC) 20 MG/ML IV SOLN
INTRAVENOUS | Status: DC | PRN
  Administered 2014-12-31: 100 mg via INTRAVENOUS

## 2014-12-31 MED ORDER — BISACODYL 10 MG RE SUPP: 10.0000 mg | Freq: Every day | RECTAL | Status: DC | PRN

## 2014-12-31 MED ORDER — VALACYCLOVIR HCL 500 MG PO TABS: 1000.0000 mg | ORAL_TABLET | Freq: Every day | ORAL | Status: DC | PRN

## 2014-12-31 MED ORDER — PROPOFOL 10 MG/ML IV BOLUS
INTRAVENOUS | Status: AC
  Filled 2014-12-31: qty 20

## 2014-12-31 MED ORDER — HYDROCHLOROTHIAZIDE 25 MG PO TABS
25.0000 mg | ORAL_TABLET | Freq: Every day | ORAL | Status: DC
  Filled 2014-12-31 (×3): qty 1

## 2014-12-31 MED ORDER — HYDROMORPHONE HCL 1 MG/ML IJ SOLN
0.2500 mg | INTRAMUSCULAR | Status: DC | PRN
  Administered 2014-12-31 (×3): 0.5 mg via INTRAVENOUS

## 2014-12-31 MED ORDER — PROPOFOL 10 MG/ML IV BOLUS
INTRAVENOUS | Status: DC | PRN
  Administered 2014-12-31: 50 mg via INTRAVENOUS
  Administered 2014-12-31: 40 mg via INTRAVENOUS
  Administered 2014-12-31: 200 mg via INTRAVENOUS

## 2014-12-31 MED ORDER — MOMETASONE FURO-FORMOTEROL FUM 200-5 MCG/ACT IN AERO
2.0000 | INHALATION_SPRAY | Freq: Two times a day (BID) | RESPIRATORY_TRACT | Status: DC
  Administered 2014-12-31 – 2015-01-01 (×3): 2 via RESPIRATORY_TRACT
  Filled 2014-12-31: qty 8.8

## 2014-12-31 MED ORDER — FENTANYL CITRATE (PF) 250 MCG/5ML IJ SOLN
INTRAMUSCULAR | Status: AC
  Filled 2014-12-31: qty 5

## 2014-12-31 MED ORDER — SODIUM CHLORIDE 0.9 % IJ SOLN: 3.0000 mL | INTRAMUSCULAR | Status: DC | PRN

## 2014-12-31 MED ORDER — ALBUTEROL SULFATE (2.5 MG/3ML) 0.083% IN NEBU: 2.5000 mg | INHALATION_SOLUTION | Freq: Four times a day (QID) | RESPIRATORY_TRACT | Status: DC | PRN

## 2014-12-31 MED ORDER — BUPIVACAINE HCL (PF) 0.25 % IJ SOLN
INTRAMUSCULAR | Status: DC | PRN
  Administered 2014-12-31: 20 mL

## 2014-12-31 MED ORDER — DIPHENHYDRAMINE HCL 50 MG/ML IJ SOLN
INTRAMUSCULAR | Status: AC
  Filled 2014-12-31: qty 1

## 2014-12-31 MED ORDER — FLEET ENEMA 7-19 GM/118ML RE ENEM
1.0000 | ENEMA | Freq: Once | RECTAL | Status: AC | PRN
  Filled 2014-12-31: qty 1

## 2014-12-31 MED ORDER — LOSARTAN POTASSIUM 50 MG PO TABS
50.0000 mg | ORAL_TABLET | Freq: Every day | ORAL | Status: DC
  Administered 2014-12-31: 50 mg via ORAL
  Filled 2014-12-31 (×3): qty 1

## 2014-12-31 MED ORDER — SODIUM CHLORIDE 0.9 % IJ SOLN
3.0000 mL | Freq: Two times a day (BID) | INTRAMUSCULAR | Status: DC
  Administered 2014-12-31 – 2015-01-01 (×2): 3 mL via INTRAVENOUS

## 2014-12-31 MED ORDER — ALBUTEROL SULFATE HFA 108 (90 BASE) MCG/ACT IN AERS: 1.0000 | INHALATION_SPRAY | Freq: Four times a day (QID) | RESPIRATORY_TRACT | Status: DC | PRN

## 2014-12-31 MED ORDER — ALBUTEROL SULFATE HFA 108 (90 BASE) MCG/ACT IN AERS
INHALATION_SPRAY | RESPIRATORY_TRACT | Status: DC | PRN
  Administered 2014-12-31: 3 via RESPIRATORY_TRACT

## 2014-12-31 MED ORDER — MIDAZOLAM HCL 2 MG/2ML IJ SOLN
INTRAMUSCULAR | Status: AC
  Filled 2014-12-31: qty 2

## 2014-12-31 MED ORDER — PHENOL 1.4 % MT LIQD: 1.0000 | OROMUCOSAL | Status: DC | PRN

## 2014-12-31 MED ORDER — PANTOPRAZOLE SODIUM 40 MG PO TBEC
40.0000 mg | DELAYED_RELEASE_TABLET | Freq: Every day | ORAL | Status: DC
  Administered 2014-12-31: 40 mg via ORAL
  Filled 2014-12-31: qty 1

## 2014-12-31 MED ORDER — PHENYLEPHRINE HCL 10 MG/ML IJ SOLN
INTRAMUSCULAR | Status: AC
  Filled 2014-12-31: qty 1

## 2014-12-31 MED ORDER — HYDROMORPHONE HCL 1 MG/ML IJ SOLN
0.5000 mg | INTRAMUSCULAR | Status: DC | PRN
  Administered 2014-12-31 – 2015-01-01 (×6): 1 mg via INTRAVENOUS
  Filled 2014-12-31 (×6): qty 1

## 2014-12-31 MED ORDER — FENTANYL CITRATE (PF) 250 MCG/5ML IJ SOLN
INTRAMUSCULAR | Status: DC | PRN
Start: 2014-12-31 — End: 2014-12-31
  Administered 2014-12-31 (×2): 50 ug via INTRAVENOUS
  Administered 2014-12-31 (×2): 100 ug via INTRAVENOUS
  Administered 2014-12-31: 50 ug via INTRAVENOUS
  Administered 2014-12-31 (×2): 100 ug via INTRAVENOUS
  Administered 2014-12-31 (×4): 50 ug via INTRAVENOUS

## 2014-12-31 MED ORDER — SODIUM CHLORIDE 0.9 % IR SOLN
Status: DC | PRN
  Administered 2014-12-31: 09:00:00

## 2014-12-31 MED ORDER — PHENYLEPHRINE 40 MCG/ML (10ML) SYRINGE FOR IV PUSH (FOR BLOOD PRESSURE SUPPORT)
PREFILLED_SYRINGE | INTRAVENOUS | Status: AC
  Filled 2014-12-31: qty 20

## 2014-12-31 MED ORDER — SUCCINYLCHOLINE CHLORIDE 20 MG/ML IJ SOLN
INTRAMUSCULAR | Status: DC | PRN
  Administered 2014-12-31: 120 mg via INTRAVENOUS

## 2014-12-31 MED ORDER — ACETAMINOPHEN 650 MG RE SUPP: 650.0000 mg | RECTAL | Status: DC | PRN

## 2014-12-31 MED ORDER — CEFAZOLIN SODIUM 1-5 GM-% IV SOLN
1.0000 g | Freq: Three times a day (TID) | INTRAVENOUS | Status: AC
  Administered 2014-12-31 (×2): 1 g via INTRAVENOUS
  Filled 2014-12-31 (×2): qty 50

## 2014-12-31 MED ORDER — HYDROCODONE-ACETAMINOPHEN 5-325 MG PO TABS
1.0000 | ORAL_TABLET | ORAL | Status: DC | PRN
  Administered 2015-01-01 – 2015-01-02 (×2): 2 via ORAL
  Filled 2014-12-31 (×2): qty 2

## 2014-12-31 MED ORDER — STERILE WATER FOR INJECTION IJ SOLN
INTRAMUSCULAR | Status: AC
  Filled 2014-12-31: qty 10

## 2014-12-31 SURGICAL SUPPLY — 68 items
APL SKNCLS STERI-STRIP NONHPOA (GAUZE/BANDAGES/DRESSINGS) ×1
BAG DECANTER FOR FLEXI CONT (MISCELLANEOUS) ×3 IMPLANT
BENZOIN TINCTURE PRP APPL 2/3 (GAUZE/BANDAGES/DRESSINGS) ×3 IMPLANT
BLADE CLIPPER SURG (BLADE) IMPLANT
BRUSH SCRUB EZ PLAIN DRY (MISCELLANEOUS) ×3 IMPLANT
BUR CUTTER 7.0 ROUND (BURR) ×2 IMPLANT
BUR MATCHSTICK NEURO 3.0 LAGG (BURR) ×3 IMPLANT
CAGE COROENT PLIF 10X28-8 LUMB (Cage) ×8 IMPLANT
CANISTER SUCT 3000ML PPV (MISCELLANEOUS) ×3 IMPLANT
CLIP NEUROVISION LG (CLIP) ×2 IMPLANT
CLOSURE WOUND 1/2 X4 (GAUZE/BANDAGES/DRESSINGS) ×2
CONT SPEC 4OZ CLIKSEAL STRL BL (MISCELLANEOUS) ×6 IMPLANT
COVER BACK TABLE 60X90IN (DRAPES) ×3 IMPLANT
DECANTER SPIKE VIAL GLASS SM (MISCELLANEOUS) ×1 IMPLANT
DRAPE C-ARM 42X72 X-RAY (DRAPES) ×6 IMPLANT
DRAPE C-ARMOR (DRAPES) ×2 IMPLANT
DRAPE LAPAROTOMY 100X72X124 (DRAPES) ×3 IMPLANT
DRAPE POUCH INSTRU U-SHP 10X18 (DRAPES) ×3 IMPLANT
DRAPE PROXIMA HALF (DRAPES) IMPLANT
DRAPE SURG 17X23 STRL (DRAPES) ×12 IMPLANT
DRSG OPSITE 4X5.5 SM (GAUZE/BANDAGES/DRESSINGS) ×2 IMPLANT
DRSG OPSITE POSTOP 4X6 (GAUZE/BANDAGES/DRESSINGS) ×2 IMPLANT
DURAPREP 26ML APPLICATOR (WOUND CARE) ×3 IMPLANT
ELECT REM PT RETURN 9FT ADLT (ELECTROSURGICAL) ×3
ELECTRODE REM PT RTRN 9FT ADLT (ELECTROSURGICAL) ×1 IMPLANT
EVACUATOR 1/8 PVC DRAIN (DRAIN) ×3 IMPLANT
GAUZE SPONGE 4X4 12PLY STRL (GAUZE/BANDAGES/DRESSINGS) ×1 IMPLANT
GAUZE SPONGE 4X4 16PLY XRAY LF (GAUZE/BANDAGES/DRESSINGS) ×4 IMPLANT
GLOVE BIO SURGEON STRL SZ7 (GLOVE) ×4 IMPLANT
GLOVE BIOGEL PI IND STRL 7.0 (GLOVE) IMPLANT
GLOVE BIOGEL PI INDICATOR 7.0 (GLOVE) ×6
GLOVE ECLIPSE 7.5 STRL STRAW (GLOVE) ×6 IMPLANT
GLOVE ECLIPSE 9.0 STRL (GLOVE) ×6 IMPLANT
GOWN STRL REUS W/ TWL LRG LVL3 (GOWN DISPOSABLE) IMPLANT
GOWN STRL REUS W/ TWL XL LVL3 (GOWN DISPOSABLE) ×2 IMPLANT
GOWN STRL REUS W/TWL 2XL LVL3 (GOWN DISPOSABLE) ×2 IMPLANT
GOWN STRL REUS W/TWL LRG LVL3 (GOWN DISPOSABLE) ×3
GOWN STRL REUS W/TWL XL LVL3 (GOWN DISPOSABLE) ×6
KIT BASIN OR (CUSTOM PROCEDURE TRAY) ×3 IMPLANT
KIT NDL NVM5 EMG ELECT (KITS) IMPLANT
KIT NEEDLE NVM5 EMG ELECT (KITS) ×1 IMPLANT
KIT NEEDLE NVM5 EMG ELECTRODE (KITS) ×2
KIT ROOM TURNOVER OR (KITS) ×3 IMPLANT
LIQUID BAND (GAUZE/BANDAGES/DRESSINGS) ×3 IMPLANT
NEEDLE HYPO 22GX1.5 SAFETY (NEEDLE) ×3 IMPLANT
NS IRRIG 1000ML POUR BTL (IV SOLUTION) ×3 IMPLANT
PACK LAMINECTOMY NEURO (CUSTOM PROCEDURE TRAY) ×3 IMPLANT
PATTIES SURGICAL 1X1 (DISPOSABLE) ×2 IMPLANT
ROD PREBENT 80MM LUMBAR (Rod) ×4 IMPLANT
SCREW LOCK (Screw) ×18 IMPLANT
SCREW LOCK FXNS SPNE MAS PL (Screw) IMPLANT
SCREW MAS PLIF 5.5X30 (Screw) ×4 IMPLANT
SCREW PAS PLIF 5X30 (Screw) IMPLANT
SCREW PLIF MAS 5.0X35 (Screw) ×4 IMPLANT
SCREW SHANK 5.0X35 (Screw) ×4 IMPLANT
SCREW TULIP 5.5 (Screw) ×4 IMPLANT
SPONGE SURGIFOAM ABS GEL 100 (HEMOSTASIS) ×5 IMPLANT
STRIP CLOSURE SKIN 1/2X4 (GAUZE/BANDAGES/DRESSINGS) ×4 IMPLANT
SUT VIC AB 0 CT1 18XCR BRD8 (SUTURE) ×2 IMPLANT
SUT VIC AB 0 CT1 8-18 (SUTURE) ×6
SUT VIC AB 2-0 CT1 18 (SUTURE) ×5 IMPLANT
SUT VIC AB 3-0 SH 8-18 (SUTURE) ×6 IMPLANT
SYR 20ML ECCENTRIC (SYRINGE) ×3 IMPLANT
TOWEL OR 17X24 6PK STRL BLUE (TOWEL DISPOSABLE) ×3 IMPLANT
TOWEL OR 17X26 10 PK STRL BLUE (TOWEL DISPOSABLE) ×3 IMPLANT
TRAP SPECIMEN MUCOUS 40CC (MISCELLANEOUS) ×2 IMPLANT
TRAY FOLEY CATH 14FRSI W/METER (CATHETERS) ×3 IMPLANT
WATER STERILE IRR 1000ML POUR (IV SOLUTION) ×3 IMPLANT

## 2014-12-31 NOTE — Transfer of Care (Signed)
Immediate Anesthesia Transfer of Care Note  Patient: Melanie Bowen  Procedure(s) Performed: Procedure(s): LUMBAR THREE-FOUR, LUMBAR FOUR-FIVE MAXIMUM ACCESS POSTERIOR LUMBAR FUSION 2 LEVEL (N/A)  Patient Location: PACU  Anesthesia Type:General  Level of Consciousness: awake  Airway & Oxygen Therapy: Patient Spontanous Breathing and Patient connected to face mask oxygen  Post-op Assessment: Report given to RN and Post -op Vital signs reviewed and stable  Post vital signs: Reviewed and stable  Last Vitals:  Filed Vitals:   12/31/14 0624  BP: 124/79  Pulse: 80  Temp: 36.3 C  Resp: 18    Complications: No apparent anesthesia complications

## 2014-12-31 NOTE — H&P (Signed)
Melanie Bowen is an 54 y.o. female.   Chief Complaint: Back pain HPI: 54 year old female with intractable back pain with radiation of both lower extremities. Workup demonstrates evidence of marked disc degeneration with disc space collapse and mild anterolisthesis at L3-4 and L4-5 with resultant neural foraminal stenosis. Patient presents now for two-level lumbar decompression and fusion.  Past Medical History  Diagnosis Date  . Asthma   . Cough   . Genital herpes   . HIV (human immunodeficiency virus infection)   . Bronchitis   . Arthritis   . GERD (gastroesophageal reflux disease)   . Eye irritation   . Paresthesia of hand, bilateral   . Blepharitis   . Allergic rhinitis   . Hypertension   . Cigarette smoker   . Headache   . Shortness of breath dyspnea     at times due to asthma  . Depression     Past Surgical History  Procedure Laterality Date  . Knee surgery Bilateral 2003,2007    total Joint  . Hand surgery Bilateral     carpal tunnel  . Tubal ligation    . Laparoscopy for ectopic pregnancy    . Bunionectomy Bilateral     feet  . Finger fracture Right     5th finger    History reviewed. No pertinent family history. Social History:  reports that she has been smoking Cigarettes.  She has a 7.6 pack-year smoking history. She has never used smokeless tobacco. She reports that she drinks about 3.0 oz of alcohol per week. She reports that she uses illicit drugs.  Allergies:  Allergies  Allergen Reactions  . Gabapentin Rash  . Zanaflex  [Tizanidine] Rash    Medications Prior to Admission  Medication Sig Dispense Refill  . albuterol (PROVENTIL HFA;VENTOLIN HFA) 108 (90 BASE) MCG/ACT inhaler Inhale 90 mcg into the lungs every 6 (six) hours as needed for wheezing or shortness of breath.     Marland Kitchen amLODipine (NORVASC) 10 MG tablet Take 10 mg by mouth daily.    Marland Kitchen efavirenz-emtricitabine-tenofovir (ATRIPLA) 600-200-300 MG per tablet Take 1 tablet by mouth at bedtime.    .  fluticasone (FLONASE) 50 MCG/ACT nasal spray Place 50 g into the nose daily as needed for allergies or rhinitis.     . Fluticasone-Salmeterol (ADVAIR) 500-50 MCG/DOSE AEPB Inhale 500 mcg into the lungs 2 (two) times daily.     . hydrochlorothiazide (HYDRODIURIL) 25 MG tablet Take 25 mg by mouth daily.    Marland Kitchen HYDROcodone-acetaminophen (NORCO) 10-325 MG per tablet Take 1 tablet by mouth every 12 (twelve) hours as needed.  0  . ipratropium-albuterol (DUONEB) 0.5-2.5 (3) MG/3ML SOLN Inhale 3 mLs into the lungs 3 (three) times daily as needed (shortness of breath, wheezing).     Marland Kitchen losartan (COZAAR) 50 MG tablet Take 50 mg by mouth daily.    Marland Kitchen omeprazole (PRILOSEC) 40 MG capsule Take 40 mg by mouth daily.    . promethazine (PHENERGAN) 25 MG tablet Take 25 mg by mouth as needed for nausea or vomiting.     . valACYclovir (VALTREX) 1000 MG tablet Take 1,000 mg by mouth daily as needed (outbreak).       No results found for this or any previous visit (from the past 48 hour(s)). No results found.  Review of Systems  Constitutional: Negative.   HENT: Negative.   Eyes: Negative.   Respiratory: Negative.   Cardiovascular: Negative.   Gastrointestinal: Negative.   Genitourinary: Negative.   Musculoskeletal: Negative.  Skin: Negative.   Neurological: Negative.   Endo/Heme/Allergies: Negative.   Psychiatric/Behavioral: Negative.     Blood pressure 124/79, pulse 80, temperature 97.3 F (36.3 C), temperature source Oral, resp. rate 18, height 5\' 6"  (1.676 m), weight 125.136 kg (275 lb 14 oz), SpO2 99 %. Physical Exam  Constitutional: She is oriented to person, place, and time. She appears well-developed and well-nourished. No distress.  HENT:  Head: Normocephalic and atraumatic.  Right Ear: External ear normal.  Left Ear: External ear normal.  Nose: Nose normal.  Mouth/Throat: Oropharynx is clear and moist.  Eyes: Conjunctivae and EOM are normal. Pupils are equal, round, and reactive to light.  Right eye exhibits no discharge. Left eye exhibits no discharge.  Neck: Normal range of motion. Neck supple. No tracheal deviation present. No thyromegaly present.  Cardiovascular: Normal rate, regular rhythm, normal heart sounds and intact distal pulses.  Exam reveals no friction rub.   No murmur heard. Respiratory: Effort normal and breath sounds normal. No respiratory distress. She has no wheezes.  GI: Soft. Bowel sounds are normal. She exhibits no distension. There is no tenderness.  Musculoskeletal: Normal range of motion. She exhibits no edema or tenderness.  Neurological: She is alert and oriented to person, place, and time. She has normal reflexes. She displays normal reflexes. No cranial nerve deficit. She exhibits normal muscle tone. Coordination normal.  Skin: Skin is warm and dry. No rash noted. She is not diaphoretic. No erythema. No pallor.  Psychiatric: She has a normal mood and affect. Her behavior is normal. Judgment and thought content normal.     Assessment/Plan L3-4, L4-5 degenerative disc disease with stenosis. Plan L3-4 and L4-5 decompressive laminectomies with foraminotomies followed by posterior lumbar interbody fusion utilizing interbody peek cages, locally harvested autograft, and sup medically posterior lateral arthrodesis utilizing cortical pedicle screw instrumentation. Risks and benefits of been explained. Patient wishes to proceed.  Kit Mollett A 12/31/2014, 7:33 AM

## 2014-12-31 NOTE — Progress Notes (Signed)
Utilization review completed.  

## 2014-12-31 NOTE — Anesthesia Preprocedure Evaluation (Addendum)
Anesthesia Evaluation  Patient identified by MRN, date of birth, ID band Patient awake    Reviewed: Allergy & Precautions, NPO status , Patient's Chart, lab work & pertinent test results  Airway Mallampati: II  TM Distance: >3 FB Neck ROM: Full    Dental no notable dental hx. (+) Teeth Intact, Dental Advisory Given   Pulmonary asthma , Current Smoker,  breath sounds clear to auscultation  + wheezing      Cardiovascular hypertension, Pt. on medications Rhythm:Regular Rate:Normal     Neuro/Psych negative neurological ROS  negative psych ROS   GI/Hepatic Neg liver ROS, GERD-  Medicated,  Endo/Other  Morbid obesity  Renal/GU negative Renal ROS  negative genitourinary   Musculoskeletal negative musculoskeletal ROS (+)   Abdominal   Peds negative pediatric ROS (+)  Hematology  (+) HIV,   Anesthesia Other Findings   Reproductive/Obstetrics negative OB ROS                           Anesthesia Physical Anesthesia Plan  ASA: III  Anesthesia Plan: General   Post-op Pain Management:    Induction: Intravenous  Airway Management Planned: Oral ETT  Additional Equipment:   Intra-op Plan:   Post-operative Plan: Extubation in OR  Informed Consent: I have reviewed the patients History and Physical, chart, labs and discussed the procedure including the risks, benefits and alternatives for the proposed anesthesia with the patient or authorized representative who has indicated his/her understanding and acceptance.   Dental advisory given  Plan Discussed with: CRNA, Surgeon and Anesthesiologist  Anesthesia Plan Comments:        Anesthesia Quick Evaluation

## 2014-12-31 NOTE — Anesthesia Postprocedure Evaluation (Signed)
  Anesthesia Post-op Note  Patient: Melanie Bowen  Procedure(s) Performed: Procedure(s) (LRB): LUMBAR THREE-FOUR, LUMBAR FOUR-FIVE MAXIMUM ACCESS POSTERIOR LUMBAR FUSION 2 LEVEL (N/A)  Patient Location: PACU  Anesthesia Type: General  Level of Consciousness: awake and alert   Airway and Oxygen Therapy: Patient Spontanous Breathing  Post-op Pain: mild  Post-op Assessment: Post-op Vital signs reviewed, Patient's Cardiovascular Status Stable, Respiratory Function Stable, Patent Airway and No signs of Nausea or vomiting  Last Vitals:  Filed Vitals:   12/31/14 1300  BP: 137/88  Pulse: 89  Temp:   Resp: 18    Post-op Vital Signs: stable   Complications: No apparent anesthesia complications

## 2014-12-31 NOTE — Anesthesia Procedure Notes (Signed)
Procedure Name: Intubation Date/Time: 12/31/2014 8:00 AM Performed by: Maude Leriche D Pre-anesthesia Checklist: Patient identified, Emergency Drugs available, Suction available, Patient being monitored and Timeout performed Patient Re-evaluated:Patient Re-evaluated prior to inductionOxygen Delivery Method: Circle system utilized Preoxygenation: Pre-oxygenation with 100% oxygen Intubation Type: IV induction Ventilation: Mask ventilation without difficulty and Oral airway inserted - appropriate to patient size Laryngoscope Size: Miller and 2 Grade View: Grade I Tube type: Oral Tube size: 7.5 mm Number of attempts: 1 Airway Equipment and Method: Stylet Placement Confirmation: ETT inserted through vocal cords under direct vision,  positive ETCO2 and breath sounds checked- equal and bilateral Secured at: 22 cm Tube secured with: Tape Dental Injury: Teeth and Oropharynx as per pre-operative assessment

## 2014-12-31 NOTE — Brief Op Note (Signed)
12/31/2014  11:57 AM  PATIENT:  Melanie Bowen  54 y.o. female  PRE-OPERATIVE DIAGNOSIS:  DDD  POST-OPERATIVE DIAGNOSIS:  DDD  PROCEDURE:  Procedure(s): LUMBAR THREE-FOUR, LUMBAR FOUR-FIVE MAXIMUM ACCESS POSTERIOR LUMBAR FUSION 2 LEVEL (N/A)  SURGEON:  Surgeon(s) and Role:    * Earnie Larsson, MD - Primary    * Consuella Lose, MD - Assisting  PHYSICIAN ASSISTANT:   ASSISTANTS:    ANESTHESIA:   general  EBL:  Total I/O In: [id number] [I.V.:2500; Blood:390; IV Piggyback:500] Out: [id number] [Urine:85; Blood:700]  BLOOD ADMINISTERED:none  DRAINS: (Medium) Hemovact drain(s) in the Epidural space with  Suction Open   LOCAL MEDICATIONS USED:  MARCAINE     SPECIMEN:  No Specimen  DISPOSITION OF SPECIMEN:  N/A  COUNTS:  YES  TOURNIQUET:  * No tourniquets in log *  DICTATION: .Dragon Dictation  PLAN OF CARE: Admit to inpatient   PATIENT DISPOSITION:  PACU - hemodynamically stable.   Delay start of Pharmacological VTE agent (>24hrs) due to surgical blood loss or risk of bleeding: yes

## 2014-12-31 NOTE — Op Note (Signed)
Date of procedure: 12/31/2014  Date of dictation: Same  Service: Neurosurgery  Preoperative diagnosis: L3-4, L4-5 degenerative disc disease with stenosis  Postoperative diagnosis: Same  Procedure Name: L3-4, L4-5 decompressive laminectomy with bilateral L3 and L4 and L5 decompressive foraminotomies, more than would be required for simple interbody fusion alone.  L3-4, L4-5 posterior lumbar interbody fusion utilizing interbody peek cages, locally harvested autograft  L3 L4 L5 posterior lateral arthrodesis utilizing segmental pedicle screw fixation and local autografting.  Surgeon:Niah Heinle A.Dayanne Yiu, M.D.  Asst. Surgeon: Kathyrn Sheriff  Anesthesia: General  Indication: 54 year old female with severe back and bilateral lower extremity pain failing conservative management. Workup demonstrates evidence of progressive disc degeneration with disc space collapse and stenosis at L3-4 and L4-5. Patient presents now for two-level lumbar decompression and fusion in hopes of improving her symptoms.  Operative note: After induction of anesthesia, patient was positioned prone onto Wilson frame and appropriately padded. Lumbar region prepped and draped sterilely. Incision made overlying L3-4 and 5. Dissection performed bilaterally. Retractor placed. Fluoroscopy used. Levels confirmed. Using intraoperative fluoroscopy injury sites for cortical pedicle screws into L3 were made first by drilling a small pilot hole with a bur into the pars interarticularis bilaterally. A pilot drill was then used to drill screw tracts into the pedicle in a cephalad and lateral orientation. This was performed under direct fluoroscopy and intraoperative neural monitoring. Pilot holes were probed and found to be solidly within the bone. Each pilot hole was then tapped again using intraoperative neural monitoring. Each pilot hole was probed and found to be solidly within the bone. 5.0 x 35 mm new nuvasive cortical pedicle screws were placed  bilaterally at L3. Decompressive laminotomies then performed bilaterally using high-speed drill and Kerrison rongeurs to remove the inferior two thirds of the lamina of L3 and L4 bilaterally. Complete inferior facetectomies of L3 and L4 were performed bilaterally. Superior facetectomies of L4 and L5 were performed bilaterally. Superior aspect of the lamina of L4 and L5 were also partially resected. Wide decompressive foraminotomies were performed on course exiting L3, L4, L5 nerve roots. All bone is cleaned in use and later autografting. Epidural venous plexus was coagulated and cut. Bilateral discectomies were then performed at L3-4 and L4-5. Disc space at L3-4 was distracted 10 mm and a 10 mm distractor was left in place. Thecal sac and nerve roots were on the right side were protected. Disc spaces and reamed and then cut with 10 mm instruments. Soft tissue was removed the disc space. A 10 mm x 22 mm x 8 cage was packed with morselized locally harvested autograft impacted into place and rotated into final position. Distractor was removed patient's left side. Thecal sac and nerve respect and left side. Disc space once again reamed and then scraped and soft tissues removed and interspace. Morselize autograft was packed in the interspace for later fusion. A second cage was impacted into place and rotated into final position. Fluoroscopy revealed good position of the cages in both AP and lateral planes. Procedures then repeated at L4-5 again using 10 mm x 22 mm x 8 implants and local autograft. Cortical pedicle screws were placed at L4 and L5 bilaterally in a similar fashion to what was used at L3. Lateral residual facets and transverse processes were decorticated. Morselize autograft was packed posterior laterally. Short segment titanium rods and placed over the screw heads from L3-L5. Locking caps and placed over the screws. Locking caps and engaged with the construct under mild compression. Final images revealed  good  position the bone graft and hardware at proper upper level with normal lamina spine. Wounds and irrigated one final time. Vancomycin powder was placed in the deep wound space. Wounds and close in layers of Vicryl sutures. A medium Hemovac drain was also left in the deep wound space. Steri-Strips and sterile dressing were applied. No apparent Complications. Patient tolerated the procedure well and she returns to the recovery room postop.

## 2014-12-31 NOTE — Progress Notes (Signed)
Report given Dispensing optician

## 2014-12-31 NOTE — Progress Notes (Signed)
Orthopedic Tech Progress Note Patient Details:  [name] December 27, 1960 [id number] Contacted [employer] to confirm brace has been pre-fitted. Patient ID: Melanie Bowen, female   DOB: 1960-11-10, 54 y.o.   MRN: [id number]   Darrol Poke 12/31/2014, 2:51 PM

## 2014-12-31 NOTE — Evaluation (Signed)
Occupational Therapy Evaluation Patient Details Name: Melanie Bowen MRN: [id number] DOB: [birth year] Today's Date: 12/31/2014    History of Present Illness 54 y.o. s/p LUMBAR THREE-FOUR, LUMBAR FOUR-FIVE MAXIMUM ACCESS POSTERIOR LUMBAR FUSION 2 LEVEL. Pt with HIV.   Clinical Impression   Pt s/p above. Pt receiving help with ADLs from aide, PTA. Feel pt will benefit from acute OT to increase independence with BADLs, prior to d/c. Plan to practice with AE next session.    Follow Up Recommendations  Home health OT;Supervision - Intermittent    Equipment Recommendations  3 in 1 bedside comode;Tub/shower bench;Other (comment) (AE)    Recommendations for Other Services       Precautions / Restrictions Precautions Precautions: Fall;Back Precaution Booklet Issued: No Precaution Comments: educated on back precautions Required Braces or Orthoses: Spinal Brace Spinal Brace: Lumbar corset;Applied in sitting position Restrictions Weight Bearing Restrictions: No      Mobility Bed Mobility Overal bed mobility: Needs Assistance Bed Mobility: Rolling;Sidelying to Sit;Sit to Sidelying Rolling: Min guard Sidelying to sit: Min assist     Sit to sidelying: Min guard General bed mobility comments: cues for technique.  Transfers Overall transfer level: Needs assistance   Transfers: Sit to/from Stand Sit to Stand: Mod assist         General transfer comment: increased effort to stand    Balance  Min-mod assist for ambulation.                                          ADL Overall ADL's : Needs assistance/impaired                 Upper Body Dressing : Sitting;Maximal assistance   Lower Body Dressing: Maximal assistance;Sit to/from stand   Toilet Transfer: Ambulation;Minimal assistance;Moderate assistance; (bed; Mod assist for sit to stand from bed; +2 assist for safety)           Functional mobility during ADLs: Minimal-Moderate assistance for  ambulation (person on each side of pt; +2 for safety) General ADL Comments: Educated on AE. Educated on use of cup for oral care and placement of grooming items to avoid breaking precautions. Educated on back brace. Discussed tub transfer with tub bench briefly.  Educated on positioning of pillows.      Vision     Perception     Praxis      Pertinent Vitals/Pain Pain Assessment: 0-10 Pain Score:  (9.5) Pain Location: back Pain Descriptors / Indicators: Sore Pain Intervention(s): Monitored during session;Repositioned     Hand Dominance Right   Extremity/Trunk Assessment Upper Extremity Assessment Upper Extremity Assessment: Overall WFL for tasks assessed   Lower Extremity Assessment Lower Extremity Assessment: Defer to PT evaluation       Communication Communication Communication: No difficulties   Cognition Arousal/Alertness: Awake/alert Behavior During Therapy: WFL for tasks assessed/performed Overall Cognitive Status: Within Functional Limits for tasks assessed                     General Comments       Exercises       Shoulder Instructions      Home Living Family/patient expects to be discharged to:: Private residence Living Arrangements: Spouse/significant other;Children Available Help at Discharge: Family;Available PRN/intermittently;Personal care attendant Type of Home: Apartment Home Access: Level entry           Bathroom Shower/Tub: Tub/shower unit  Bathroom Toilet: Standard                Prior Functioning/Environment Level of Independence: Needs assistance    ADL's / Homemaking Assistance Needed: assist with bathing, dressing, cooking, cleaning; aide comes 2 hours per day/7 days a week        OT Diagnosis: Acute pain;Generalized weakness   OT Problem List: Decreased strength;Decreased knowledge of use of DME or AE;Decreased knowledge of precautions;Pain;Decreased range of motion;Decreased activity tolerance;Impaired  balance (sitting and/or standing);Obesity   OT Treatment/Interventions: Self-care/ADL training;DME and/or AE instruction;Therapeutic activities;Patient/family education;Balance training    OT Goals(Current goals can be found in the care plan section) Acute Rehab OT Goals Patient Stated Goal: not stated OT Goal Formulation: With patient Time For Goal Achievement: 01/07/15 Potential to Achieve Goals: Good ADL Goals Pt Will Perform Grooming: with set-up;with supervision;standing (3 tasks) Pt Will Perform Lower Body Dressing: with set-up;with supervision;with adaptive equipment;sit to/from stand Pt Will Transfer to Toilet: with supervision;ambulating (3 in 1 over commode) Pt Will Perform Toileting - Clothing Manipulation and hygiene: sit to/from stand;with supervision Additional ADL Goal #1: Pt will independently verbalize and maintain 3/3 back precautions during ADLs/functional activities.  OT Frequency: Min 2X/week   Barriers to D/C:            Co-evaluation              End of Session Equipment Utilized During Treatment: Gait belt;Back brace Nurse Communication: Mobility status and d/c recommendations  Activity Tolerance: Other (comment);Patient limited by pain (felt/looked as she was going to pass out) Patient left: in bed;with call bell/phone within reach;with family/visitor present   Time: 1700-1721 OT Time Calculation (min): 21 min Charges:  OT General Charges $OT Visit: 1 Procedure OT Evaluation $Initial OT Evaluation Tier I: 1 Procedure G-CodesBenito Mccreedy OTR/L [id number] 12/31/2014, 5:41 PM

## 2015-01-01 NOTE — Care Management Note (Signed)
Case Management Note  Patient Details  Name: Melanie Bowen MRN: [id number] Date of Birth: 10/23/1960  Subjective/Objective:                    Action/Plan:   Expected Discharge Date:  01/02/15               Expected Discharge Plan:  [place]  In-House Referral:  NA  Discharge planning Services  CM Consult  Post Acute Care Choice:  Durable Medical Equipment, Home Health Choice offered to:  Patient  DME Arranged:  3-N-1, Walker rolling DME Agency:  [place]:  PT, OT [facility] Agency:  [place]  Status of Service:  Completed, signed off  Medicare Important Message Given:  N/A - LOS <3 / Initial given by admissions Date Medicare IM Given:    Medicare IM give by:    Date Additional Medicare IM Given:    Additional Medicare Important Message give by:     If discussed at [place] of Stay Meetings, dates discussed:    Additional Comments:  Ninfa Meeker, RN 01/01/2015, 2:54 PM

## 2015-01-01 NOTE — Evaluation (Signed)
Physical Therapy Evaluation Patient Details Name: Melanie Bowen MRN: [id number] DOB: 11-10-1960 Today's Date: 01/01/2015   History of Present Illness  54 y.o. s/p LUMBAR THREE-FOUR, LUMBAR FOUR-FIVE MAXIMUM ACCESS POSTERIOR LUMBAR FUSION 2 LEVEL. Pt with HIV, carpal tunnel with bil hand paresthesias, TKR  Clinical Impression  Patient is s/p above surgery resulting in the deficits listed below (see PT Problem List).  Patient will benefit from skilled PT to increase their independence and safety with mobility (while adhering to their precautions) to allow discharge to the venue listed below.     Follow Up Recommendations Home health PT;Supervision for mobility/OOB    Equipment Recommendations  Rolling walker with 5" wheels    Recommendations for Other Services       Precautions / Restrictions Precautions Precautions: Fall;Back Precaution Booklet Issued: Yes (comment) Precaution Comments: pt unable to recall any back precautions at abeginning of session; by end of session, could state 2/3 Required Braces or Orthoses: Spinal Brace Spinal Brace: Lumbar corset;Applied in standing position (abd very distended and could not don in sitting) Restrictions Weight Bearing Restrictions: No      Mobility  Bed Mobility Overal bed mobility: Needs Assistance Bed Mobility: Rolling;Sit to Sidelying Rolling: Min guard       Sit to sidelying: Min assist General bed mobility comments: cues for technique to maintain back precautions--especially to avoid twisting  Transfers Overall transfer level: Needs assistance Equipment used: Rolling walker (2 wheeled) Transfers: Sit to/from Stand Sit to Stand: Mod assist         General transfer comment: pt in folding chair without armrests on arrival; RW stabilized by PT and pt pushed down with bil UEs to assist with come to stand; educated to only sit in chairs with armrests or MUST have someone hold RW as she comes to  stand  Ambulation/Gait Ambulation/Gait assistance: Min guard Ambulation Distance (Feet): 60 Feet Assistive device: Rolling walker (2 wheeled) Gait Pattern/deviations: Step-through pattern;Decreased stride length Gait velocity: slow   General Gait Details: vc for safe use of RW (keep closer to body)  Stairs            Wheelchair Mobility    Modified Rankin (Stroke Patients Only)       Balance Overall balance assessment: Needs assistance         Standing balance support: Bilateral upper extremity supported Standing balance-Leahy Scale: Poor                               Pertinent Vitals/Pain Pain Assessment: 0-10 Pain Score: 10-Worst pain ever Pain Location: back Pain Intervention(s): Limited activity within patient's tolerance;Monitored during session;Repositioned;Patient requesting pain meds-RN notified;Relaxation    Home Living Family/patient expects to be discharged to:: Private residence Living Arrangements: Spouse/significant other;Children Available Help at Discharge: Family;Available PRN/intermittently;Personal care attendant Type of Home: Apartment Home Access: Level entry       Home Equipment: None      Prior Function Level of Independence: Needs assistance      ADL's / Homemaking Assistance Needed: assist with bathing, dressing, cooking, cleaning; aide comes 2 hours per day/7 days a week        Hand Dominance   Dominant Hand: Right    Extremity/Trunk Assessment   Upper Extremity Assessment: Overall WFL for tasks assessed;Defer to OT evaluation           Lower Extremity Assessment: Overall [facility] for tasks assessed      Cervical /  Trunk Assessment: Normal  Communication   Communication: No difficulties  Cognition Arousal/Alertness: Awake/alert Behavior During Therapy: WFL for tasks assessed/performed Overall Cognitive Status: Within Functional Limits for tasks assessed                      General  Comments      Exercises        Assessment/Plan    PT Assessment Patient needs continued PT services  PT Diagnosis Difficulty walking;Acute pain   PT Problem List Decreased activity tolerance;Decreased balance;Decreased mobility;Decreased knowledge of use of DME;Decreased knowledge of precautions;Pain  PT Treatment Interventions DME instruction;Gait training;Functional mobility training;Therapeutic activities;Patient/family education   PT Goals (Current goals can be found in the Care Plan section) Acute Rehab PT Goals Patient Stated Goal: decr pain PT Goal Formulation: With patient Time For Goal Achievement: 01/04/15 Potential to Achieve Goals: Good    Frequency Min 5X/week   Barriers to discharge        Co-evaluation               End of Session Equipment Utilized During Treatment: Back brace Activity Tolerance: Patient limited by pain Patient left: in bed;with call bell/phone within reach Nurse Communication: Mobility status;Patient requests pain meds         Time: [id number] PT Time Calculation (min) (ACUTE ONLY): 18 min   Charges:   PT Evaluation $Initial PT Evaluation Tier I: 1 Procedure     PT G Codes:        [id number] 01-08-2015, 9:23 AM Pager [phone]

## 2015-01-01 NOTE — Progress Notes (Signed)
Postop day 1. Patient complaining of back pain and spasms. No radicular pain. No complaints of numbness or weakness. Standing and walking better.  Afebrile. Vitals are stable. Awake and alert. Oriented and appropriate. Motor and sensory function intact. Dressing clean and dry. Chest and abdomen benign.  Progressing slowly following 2 level lumbar decompression and fusion. Mobilize further today. Probable discharge in morning.

## 2015-01-01 NOTE — Progress Notes (Addendum)
Occupational Therapy Treatment Patient Details Name: Melanie Bowen MRN: [id number] DOB: 1960/10/18 Today's Date: 01/01/2015    History of present illness 54 y.o. s/p LUMBAR THREE-FOUR, LUMBAR FOUR-FIVE MAXIMUM ACCESS POSTERIOR LUMBAR FUSION 2 LEVEL. Pt with HIV, carpal tunnel with bil hand paresthesias, TKR   OT comments  Pt progressing and able to ambulate in hallway today with RW. Education provided in session and pt practiced with AE.   Follow Up Recommendations  Home health OT;Supervision - Intermittent    Equipment Recommendations  3 in 1 bedside comode;Tub/shower bench;Other (comment) (AE)    Recommendations for Other Services      Precautions / Restrictions Precautions Precautions: Fall;Back Precaution Booklet Issued: No Precaution Comments: reviewed back precautions more than once in session Required Braces or Orthoses: Spinal Brace Spinal Brace: Lumbar corset;Applied in sitting position Restrictions Weight Bearing Restrictions: No       Mobility Bed Mobility Overal bed mobility: Needs Assistance Bed Mobility: Rolling;Sidelying to Sit;Sit to Sidelying Rolling: Supervision; Moderate assist Sidelying to sit: Supervision     Sit to sidelying: Min assist General bed mobility comments: Cues for technique. When OT arrived, pt with trunk sitting up, so returned to supine and log rolled to get up.  Transfers Overall transfer level: Needs assistance Equipment used: Rolling walker (2 wheeled) Transfers: Sit to/from [facility] Sit to Stand: Min assist Stand pivot transfers: Min guard       General transfer comment: assist to boost.    Balance  Min guard for ambulation with RW.                                 ADL Overall ADL's : Needs assistance/impaired     Grooming: Wash/dry face;Oral care;Set up;Supervision/safety;Standing           Upper Body Dressing : Minimal assistance;Sitting;Standing   Lower Body Dressing: Set  up;Supervision/safety;Sitting/lateral leans;With adaptive equipment (practiced donning/doffing sock)   Toilet Transfer: Min guard;Ambulation;RW;Minimal assistance (chair/bed; Min assist for sit to stand;min guard for walking)           Functional mobility during ADLs: Min guard;Rolling walker General ADL Comments: Discussed incorporating precautions into functional activities. Educated on back brace. Educated on safety such as use of bag on walker. Educated on what pt could use for toilet aide and she states she has hot dog tongs (pt also stated that she could do this in bed and have people help her with this). Educated on AE and pt practiced with sockaid/reacher. Instructed pt not to sit longer than one hour, and got her positioned in chair, and she could not tolerate sitting there, so returned to bed.  Discussed positioning of pillows.      Vision                     Perception     Praxis      Cognition  Awake/Alert Behavior During Therapy: WFL for tasks assessed/performed Overall Cognitive Status: Within Functional Limits for tasks assessed                       Extremity/Trunk Assessment               Exercises     Shoulder Instructions       General Comments      Pertinent Vitals/ Pain       Pain Assessment: 0-10 Pain Score: 9  Pain  Location: back  Pain Descriptors / Indicators: Aching Pain Intervention(s): Limited activity within patient's tolerance;Monitored during session;Repositioned  Home Living                                          Prior Functioning/Environment              Frequency Min 2X/week     Progress Toward Goals  OT Goals(current goals can now be found in the care plan section)  Progress towards OT goals: Progressing toward goals  Acute Rehab OT Goals Patient Stated Goal: not stated OT Goal Formulation: With patient Time For Goal Achievement: 01/07/15 Potential to Achieve Goals: Good ADL  Goals Pt Will Perform Grooming: with set-up;with supervision;standing (3 tasks) Pt Will Perform Lower Body Dressing: with set-up;with supervision;with adaptive equipment;sit to/from stand Pt Will Transfer to Toilet: with supervision;ambulating (3 in 1 over commode) Pt Will Perform Toileting - Clothing Manipulation and hygiene: sit to/from stand;with supervision Additional ADL Goal #1: Pt will independently verbalize and maintain 3/3 back precautions during ADLs/functional activities.  Plan Discharge plan remains appropriate    Co-evaluation                 End of Session Equipment Utilized During Treatment: Gait belt;Rolling walker;Back brace   Activity Tolerance Patient limited by pain   Patient Left in bed;with call bell/phone within reach   Nurse Communication Other (comment) (pt back in bed)        Time: [id number] OT Time Calculation (min): 31 min  Charges: OT General Charges $OT Visit: 1 Procedure OT Treatments $Self Care/Home Management : 8-22 mins $Therapeutic Activity: 8-22 mins   Benito Mccreedy OTR/L [id number] 01/01/2015, 3:43 PM

## 2015-01-02 MED ORDER — OXYCODONE-ACETAMINOPHEN 5-325 MG PO TABS: 1.0000 | ORAL_TABLET | ORAL | Status: DC | PRN

## 2015-01-02 MED ORDER — DIAZEPAM 5 MG PO TABS: 5.0000 mg | ORAL_TABLET | Freq: Four times a day (QID) | ORAL | Status: DC | PRN

## 2015-01-02 NOTE — Discharge Instructions (Signed)

## 2015-01-02 NOTE — Progress Notes (Signed)
Patient alert and oriented, mae's well, voiding adequate amount of urine, swallowing without difficulty, c/o moderate pain and meds given prior to discharged. Patient discharged home with family. Script and discharged instructions given to patient. Patient and family stated understanding of instructions given.  

## 2015-01-02 NOTE — Discharge Summary (Signed)
Physician Discharge Summary  Patient ID: Melanie Bowen MRN: [id number] DOB/AGE: 53-May-1962 53 y.o.  Admit date: 12/31/2014 Discharge date: 01/02/2015  Admission Diagnoses:  Discharge Diagnoses:  Principal Problem:   DDD (degenerative disc disease), lumbar Active Problems:   Degenerative disc disease, lumbar   Discharged Condition: good  Hospital Course: The patient was admitted the hospital where she underwent uncomplicated two-level lumbar decompression and fusion. Postoperative she is doing well. She had the expected amount of incisional pain but her radicular pain has resolved. She is mobilizing with therapy. She standing and ambulating without difficulty. She is ready for discharge home.  Consults:   Significant Diagnostic Studies:   Treatments:   Discharge Exam: Blood pressure 122/71, pulse 107, temperature 99.9 F (37.7 C), temperature source Oral, resp. rate 20, height 5\' 6"  (1.676 m), weight 125.136 kg (275 lb 14 oz), SpO2 97 %. Awake and alert. Oriented and appropriate. Motor and sensory function intact. Wound clean and dry. Chest and abdomen benign.  Disposition:      Medication List    TAKE these medications        albuterol 108 (90 BASE) MCG/ACT inhaler  Commonly known as:  PROVENTIL HFA;VENTOLIN HFA  Inhale 90 mcg into the lungs every 6 (six) hours as needed for wheezing or shortness of breath.     amLODipine 10 MG tablet  Commonly known as:  NORVASC  Take 10 mg by mouth daily.     diazepam 5 MG tablet  Commonly known as:  VALIUM  Take 1-2 tablets (5-10 mg total) by mouth every 6 (six) hours as needed for muscle spasms.     efavirenz-emtricitabine-tenofovir 600-200-300 MG per tablet  Commonly known as:  ATRIPLA  Take 1 tablet by mouth at bedtime.     fluticasone 50 MCG/ACT nasal spray  Commonly known as:  FLONASE  Place 50 g into the nose daily as needed for allergies or rhinitis.     Fluticasone-Salmeterol 500-50 MCG/DOSE Aepb  Commonly known  as:  ADVAIR  Inhale 500 mcg into the lungs 2 (two) times daily.     hydrochlorothiazide 25 MG tablet  Commonly known as:  HYDRODIURIL  Take 25 mg by mouth daily.     HYDROcodone-acetaminophen 10-325 MG per tablet  Commonly known as:  NORCO  Take 1 tablet by mouth every 12 (twelve) hours as needed.     ipratropium-albuterol 0.5-2.5 (3) MG/3ML Soln  Commonly known as:  DUONEB  Inhale 3 mLs into the lungs 3 (three) times daily as needed (shortness of breath, wheezing).     losartan 50 MG tablet  Commonly known as:  COZAAR  Take 50 mg by mouth daily.     omeprazole 40 MG capsule  Commonly known as:  PRILOSEC  Take 40 mg by mouth daily.     oxyCODONE-acetaminophen 5-325 MG per tablet  Commonly known as:  PERCOCET/ROXICET  Take 1-2 tablets by mouth every 4 (four) hours as needed for moderate pain.     promethazine 25 MG tablet  Commonly known as:  PHENERGAN  Take 25 mg by mouth as needed for nausea or vomiting.     valACYclovir 1000 MG tablet  Commonly known as:  VALTREX  Take 1,000 mg by mouth daily as needed (outbreak).           Follow-up Information    Follow up with [facility].   Why:  Someone from [facility] will contact you concerning start date and time for therapy.  Contact information:   [address] [place] [address] [phone]       Follow up with Charlie Pitter, MD.   Specialty:  Neurosurgery   Contact information:   [address] [place] [address] [phone]       Signed: Charlie Pitter 01/02/2015, 7:49 AM

## 2015-01-02 NOTE — Progress Notes (Signed)
Physical Therapy Treatment Patient Details Name: Melanie Bowen MRN: [id number] DOB: [birth year] Today's Date: 01/02/2015    History of Present Illness 54 y.o. s/p LUMBAR THREE-FOUR, LUMBAR FOUR-FIVE MAXIMUM ACCESS POSTERIOR LUMBAR FUSION 2 LEVEL. Pt with HIV, carpal tunnel with bil hand paresthesias, TKR    PT Comments    Patient seen for education in preparation for discharge home. Patient able to recall 2/3 precautions at this time. Patient educated on safety with mobility and compliance with precautions. Patient performed transfer and simulated car transfer. At this time, continue to recommend prn assist upon discharge.   Follow Up Recommendations  Home health PT;Supervision for mobility/OOB     Equipment Recommendations  Rolling walker with 5" wheels    Recommendations for Other Services       Precautions / Restrictions Precautions Precautions: Fall;Back Precaution Booklet Issued: Yes (comment) Precaution Comments: pt unable to recall any back precautions at abeginning of session; by end of session, could state 2/3 Required Braces or Orthoses: Spinal Brace Spinal Brace: Lumbar corset;Applied in standing position (abd very distended and could not don in sitting) Restrictions Weight Bearing Restrictions: No    Mobility  Bed Mobility               General bed mobility comments: received sitting EOB, cues for safety with mobility  Transfers Overall transfer level: Needs assistance Equipment used: Rolling walker (2 wheeled) Transfers: Sit to/from Stand Sit to Stand: Min assist         General transfer comment: VCs for hand placement assist from low surface during simulated car transfer. Min guard from bed  Ambulation/Gait Ambulation/Gait assistance: Supervision Ambulation Distance (Feet): 20 Feet Assistive device: Rolling walker (2 wheeled) Gait Pattern/deviations: Step-through pattern;Decreased stride length Gait velocity: slow   General Gait Details: vc  for safe use of RW (keep closer to body)   Stairs            Wheelchair Mobility    Modified Rankin (Stroke Patients Only)       Balance           Standing balance support: Bilateral upper extremity supported Standing balance-Leahy Scale: Poor                      Cognition Arousal/Alertness: Awake/alert Behavior During Therapy: WFL for tasks assessed/performed Overall Cognitive Status: Within Functional Limits for tasks assessed                      Exercises      General Comments General comments (skin integrity, edema, etc.): performed simulated car transfer, with cues for education and technique      Pertinent Vitals/Pain Pain Assessment: 0-10 Pain Score: 8  Pain Location: back Pain Descriptors / Indicators: Aching Pain Intervention(s): Repositioned;Monitored during session;RN gave pain meds during session    Home Living                      Prior Function            PT Goals (current goals can now be found in the care plan section) Acute Rehab PT Goals Patient Stated Goal: to go home PT Goal Formulation: With patient Time For Goal Achievement: 01/04/15 Potential to Achieve Goals: Good Progress towards PT goals: Progressing toward goals    Frequency  Min 5X/week    PT Plan Current plan remains appropriate    Co-evaluation  End of Session Equipment Utilized During Treatment: Back brace Activity Tolerance: Patient limited by pain Patient left: in bed;with call bell/phone within reach (sitting EOB)     Time: [id number] PT Time Calculation (min) (ACUTE ONLY): 14 min  Charges:  $Self Care/Home Management: 05-10-23                    G CodesDuncan Dull 01-21-15, 8:35 AM Alben Deeds, PT DPT  [phone]

## 2015-01-17 ENCOUNTER — Other Ambulatory Visit: Payer: Self-pay | Admitting: Family Medicine

## 2015-01-17 DIAGNOSIS — Z1231 Encounter for screening mammogram for malignant neoplasm of breast: Secondary | ICD-10-CM

## 2015-02-01 ENCOUNTER — Emergency Department
Admission: EM | Admit: 2015-02-01 | Discharge: 2015-02-01 | Disposition: A | Discharge status: Home / Self Care | Payer: Medicare Other | Attending: Emergency Medicine | Admitting: Emergency Medicine

## 2015-02-01 ENCOUNTER — Encounter: Payer: Self-pay | Admitting: Emergency Medicine

## 2015-02-01 ENCOUNTER — Emergency Department: Payer: Medicare Other

## 2015-02-01 DIAGNOSIS — I10 Essential (primary) hypertension: Secondary | ICD-10-CM | POA: Insufficient documentation

## 2015-02-01 DIAGNOSIS — Z720 Tobacco use: Secondary | ICD-10-CM | POA: Insufficient documentation

## 2015-02-01 DIAGNOSIS — Y9301 Activity, walking, marching and hiking: Secondary | ICD-10-CM | POA: Diagnosis not present

## 2015-02-01 DIAGNOSIS — S299XXA Unspecified injury of thorax, initial encounter: Secondary | ICD-10-CM | POA: Insufficient documentation

## 2015-02-01 DIAGNOSIS — Y998 Other external cause status: Secondary | ICD-10-CM | POA: Insufficient documentation

## 2015-02-01 DIAGNOSIS — M5136 Other intervertebral disc degeneration, lumbar region: Secondary | ICD-10-CM | POA: Diagnosis not present

## 2015-02-01 DIAGNOSIS — S3992XA Unspecified injury of lower back, initial encounter: Secondary | ICD-10-CM | POA: Diagnosis present

## 2015-02-01 DIAGNOSIS — Z7951 Long term (current) use of inhaled steroids: Secondary | ICD-10-CM | POA: Diagnosis not present

## 2015-02-01 DIAGNOSIS — Z79899 Other long term (current) drug therapy: Secondary | ICD-10-CM | POA: Diagnosis not present

## 2015-02-01 DIAGNOSIS — M545 Low back pain, unspecified: Secondary | ICD-10-CM

## 2015-02-01 DIAGNOSIS — Y9289 Other specified places as the place of occurrence of the external cause: Secondary | ICD-10-CM | POA: Diagnosis not present

## 2015-02-01 DIAGNOSIS — W1839XA Other fall on same level, initial encounter: Secondary | ICD-10-CM | POA: Insufficient documentation

## 2015-02-01 DIAGNOSIS — S3991XA Unspecified injury of abdomen, initial encounter: Secondary | ICD-10-CM | POA: Diagnosis not present

## 2015-02-01 MED ORDER — HYDROCODONE-ACETAMINOPHEN 5-325 MG PO TABS
2.0000 | ORAL_TABLET | Freq: Once | ORAL | Status: AC
  Administered 2015-02-01: 2 via ORAL

## 2015-02-01 MED ORDER — KETOROLAC TROMETHAMINE 30 MG/ML IJ SOLN: 60.0000 mg | Freq: Once | INTRAMUSCULAR | Status: DC

## 2015-02-01 MED ORDER — IBUPROFEN 800 MG PO TABS: 800.0000 mg | ORAL_TABLET | Freq: Three times a day (TID) | ORAL | Status: DC | PRN

## 2015-02-01 MED ORDER — KETOROLAC TROMETHAMINE 60 MG/2ML IM SOLN
INTRAMUSCULAR | Status: AC
  Administered 2015-02-01: 60 mg
  Filled 2015-02-01: qty 2

## 2015-02-01 MED ORDER — OXYCODONE-ACETAMINOPHEN 5-325 MG PO TABS: 1.0000 | ORAL_TABLET | ORAL | Status: DC | PRN

## 2015-02-01 MED ORDER — DIAZEPAM 5 MG PO TABS: 5.0000 mg | ORAL_TABLET | Freq: Three times a day (TID) | ORAL | Status: DC | PRN

## 2015-02-01 MED ORDER — HYDROCODONE-ACETAMINOPHEN 5-325 MG PO TABS
ORAL_TABLET | ORAL | Status: AC
  Administered 2015-02-01: 2 via ORAL
  Filled 2015-02-01: qty 2

## 2015-02-01 NOTE — ED Notes (Signed)
Pt to ed with c/o low back pain, pt states she had surgery on back on may 3rd.  Reports she fell this am on right side and back this am while walking on rocks in driveway with walker.  Pt reports "I just want to make sure nothing is out of place"

## 2015-02-01 NOTE — Discharge Instructions (Signed)
Back Pain, Adult Low back pain is very common. About 1 in 5 people have back pain.The cause of low back pain is rarely dangerous. The pain often gets better over time.About half of people with a sudden onset of back pain feel better in just 2 weeks. About 8 in 10 people feel better by 6 weeks.  CAUSES Some common causes of back pain include:  Strain of the muscles or ligaments supporting the spine.  Wear and tear (degeneration) of the spinal discs.  Arthritis.  Direct injury to the back. DIAGNOSIS Most of the time, the direct cause of low back pain is not known.However, back pain can be treated effectively even when the exact cause of the pain is unknown.Answering your caregiver's questions about your overall health and symptoms is one of the most accurate ways to make sure the cause of your pain is not dangerous. If your caregiver needs more information, he or she may order lab work or imaging tests (X-rays or MRIs).However, even if imaging tests show changes in your back, this usually does not require surgery. HOME CARE INSTRUCTIONS For many people, back pain returns.Since low back pain is rarely dangerous, it is often a condition that people can learn to manageon their own.   Remain active. It is stressful on the back to sit or stand in one place. Do not sit, drive, or stand in one place for more than 30 minutes at a time. Take short walks on level surfaces as soon as pain allows.Try to increase the length of time you walk each day.  Do not stay in bed.Resting more than 1 or 2 days can delay your recovery.  Do not avoid exercise or work.Your body is made to move.It is not dangerous to be active, even though your back may hurt.Your back will likely heal faster if you return to being active before your pain is gone.  Pay attention to your body when you bend and lift. Many people have less discomfortwhen lifting if they bend their knees, keep the load close to their bodies,and  avoid twisting. Often, the most comfortable positions are those that put less stress on your recovering back.  Find a comfortable position to sleep. Use a firm mattress and lie on your side with your knees slightly bent. If you lie on your back, put a pillow under your knees.  Only take over-the-counter or prescription medicines as directed by your caregiver. Over-the-counter medicines to reduce pain and inflammation are often the most helpful.Your caregiver may prescribe muscle relaxant drugs.These medicines help dull your pain so you can more quickly return to your normal activities and healthy exercise.  Put ice on the injured area.  Put ice in a plastic bag.  Place a towel between your skin and the bag.  Leave the ice on for 15-20 minutes, 03-04 times a day for the first 2 to 3 days. After that, ice and heat may be alternated to reduce pain and spasms.  Ask your caregiver about trying back exercises and gentle massage. This may be of some benefit.  Avoid feeling anxious or stressed.Stress increases muscle tension and can worsen back pain.It is important to recognize when you are anxious or stressed and learn ways to manage it.Exercise is a great option. SEEK MEDICAL CARE IF:  You have pain that is not relieved with rest or medicine.  You have pain that does not improve in 1 week.  You have new symptoms.  You are generally not feeling well. SEEK   IMMEDIATE MEDICAL CARE IF:   You have pain that radiates from your back into your legs.  You develop new bowel or bladder control problems.  You have unusual weakness or numbness in your arms or legs.  You develop nausea or vomiting.  You develop abdominal pain.  You feel faint. Document Released: 08/16/2005 Document Revised: 02/15/2012 Document Reviewed: 12/18/2013 [employer] Patient Information 2015 [employer]. This information is not intended to replace advice given to you by your health care provider. Make sure you  discuss any questions you have with your health care provider.  

## 2015-02-01 NOTE — ED Provider Notes (Signed)
[facility] Emergency Department Provider Note  ____________________________________________  Time seen: Approximately 9:14 AM  I have reviewed the triage vital signs and the nursing notes.   HISTORY  Chief Complaint Back Pain    HPI Melanie Bowen is a 54 y.o. female patient complains of low back pain status post fall last night. Patient states she was using her walker and fell on some rocks landing on her abdomen and back. She desires to have her back x-ray to make sure everything is "intact." In addition she complains of some mild abdominal and lower chest wall pain from the fall.   Past Medical History  Diagnosis Date  . Asthma   . Cough   . Genital herpes   . HIV (human immunodeficiency virus infection)   . Bronchitis   . Arthritis   . GERD (gastroesophageal reflux disease)   . Eye irritation   . Paresthesia of hand, bilateral   . Blepharitis   . Allergic rhinitis   . Hypertension   . Cigarette smoker   . Headache   . Shortness of breath dyspnea     at times due to asthma  . Depression     Patient Active Problem List   Diagnosis Date Noted  . DDD (degenerative disc disease), lumbar 12/31/2014  . Degenerative disc disease, lumbar 12/31/2014  . Asthma, chronic 11/12/2014    Past Surgical History  Procedure Laterality Date  . Knee surgery Bilateral 2003,2007    total Joint  . Hand surgery Bilateral     carpal tunnel  . Tubal ligation    . Laparoscopy for ectopic pregnancy    . Bunionectomy Bilateral     feet  . Finger fracture Right     5th finger  . Back surgery may 2016      Current Outpatient Rx  Name  Route  Sig  Dispense  Refill  . albuterol (PROVENTIL HFA;VENTOLIN HFA) 108 (90 BASE) MCG/ACT inhaler   Inhalation   Inhale 90 mcg into the lungs every 6 (six) hours as needed for wheezing or shortness of breath.          Marland Kitchen amLODipine (NORVASC) 10 MG tablet   Oral   Take 10 mg by mouth daily.         . diazepam  (VALIUM) 5 MG tablet   Oral   Take 1 tablet (5 mg total) by mouth every 8 (eight) hours as needed for anxiety.   30 tablet   0   . efavirenz-emtricitabine-tenofovir (ATRIPLA) 600-200-300 MG per tablet   Oral   Take 1 tablet by mouth at bedtime.         . fluticasone (FLONASE) 50 MCG/ACT nasal spray   Nasal   Place 50 g into the nose daily as needed for allergies or rhinitis.          . Fluticasone-Salmeterol (ADVAIR) 500-50 MCG/DOSE AEPB   Inhalation   Inhale 500 mcg into the lungs 2 (two) times daily.          . hydrochlorothiazide (HYDRODIURIL) 25 MG tablet   Oral   Take 25 mg by mouth daily.         Marland Kitchen HYDROcodone-acetaminophen (NORCO) 10-325 MG per tablet   Oral   Take 1 tablet by mouth every 12 (twelve) hours as needed.      0   . ibuprofen (ADVIL,MOTRIN) 800 MG tablet   Oral   Take 1 tablet (800 mg total) by mouth every 8 (eight) hours  as needed.   30 tablet   0   . ipratropium-albuterol (DUONEB) 0.5-2.5 (3) MG/3ML SOLN   Inhalation   Inhale 3 mLs into the lungs 3 (three) times daily as needed (shortness of breath, wheezing).          Marland Kitchen losartan (COZAAR) 50 MG tablet   Oral   Take 50 mg by mouth daily.         Marland Kitchen omeprazole (PRILOSEC) 40 MG capsule   Oral   Take 40 mg by mouth daily.         Marland Kitchen oxyCODONE-acetaminophen (ROXICET) 5-325 MG per tablet   Oral   Take 1-2 tablets by mouth every 4 (four) hours as needed for severe pain.   15 tablet   0   . promethazine (PHENERGAN) 25 MG tablet   Oral   Take 25 mg by mouth as needed for nausea or vomiting.          . valACYclovir (VALTREX) 1000 MG tablet   Oral   Take 1,000 mg by mouth daily as needed (outbreak).            Allergies Gabapentin and Zanaflex   History reviewed. No pertinent family history.  Social History History  Substance Use Topics  . Smoking status: Current Some Day Smoker -- 0.20 packs/day for 38 years    Types: Cigarettes  . Smokeless tobacco: Never Used  .  Alcohol Use: No    Review of Systems Constitutional: No fever/chills Eyes: No visual changes. ENT: No sore throat. Cardiovascular: As a for chest wall pain. Respiratory: Denies shortness of breath. Gastrointestinal: Positive right upper quadrant and right rib pain..  No nausea, no vomiting.  No diarrhea.  No constipation. Genitourinary: Negative for dysuria. Musculoskeletal: Positive for back pain Skin: Negative for rash. Neurological: Negative for headaches, focal weakness or numbness.  10-point ROS otherwise negative.  ____________________________________________   PHYSICAL EXAM:  VITAL SIGNS: ED Triage Vitals  Enc Vitals Group     BP 02/01/15 0858 124/76 mmHg     Pulse Rate 02/01/15 0858 96     Resp 02/01/15 0858 18     Temp 02/01/15 0858 98.3 F (36.8 C)     Temp Source 02/01/15 0858 Oral     SpO2 02/01/15 0858 95 %     Weight 02/01/15 0858 272 lb (123.378 kg)     Height 02/01/15 0858 5\' 6"  (1.676 m)     Head Cir --      Peak Flow --      Pain Score 02/01/15 0859 10     Pain Loc --      Pain Edu? --      Excl. in [place]? --     Constitutional: Alert and oriented. Well appearing and in no acute distress. Eyes: Conjunctivae are normal. PERRL. EOMI. Head: Atraumatic. Nose: No congestion/rhinnorhea. Mouth/Throat: Mucous membranes are moist.  Oropharynx non-erythematous. Neck: No stridor.   Cardiovascular: Normal rate, regular rhythm. Grossly normal heart sounds.  Good peripheral circulation. Respiratory: Normal respiratory effort.  No retractions. Lungs CTAB. Gastrointestinal: Soft and nontender. No distention. No abdominal bruits. No CVA tenderness. Musculoskeletal: No lower extremity tenderness nor edema.  No joint effusions. Neurologic:  Normal speech and language. No gross focal neurologic deficits are appreciated. Speech is normal. No gait instability. Skin:  Skin is warm, dry and intact. No rash noted. Psychiatric: Mood and affect are normal. Speech and  behavior are normal.  ____________________________________________   LABS (all labs ordered are listed, but  only abnormal results are displayed)  Labs Reviewed - No data to display ____________________________________________  EKG  Deferred ____________________________________________  RADIOLOGY  Interpreted by radiologist, reviewed by myself. Negative for fractures. ____________________________________________   PROCEDURES  Procedure(s) performed: None  Critical Care performed: No  ____________________________________________   INITIAL IMPRESSION / ASSESSMENT AND PLAN / ED COURSE  Pertinent labs & imaging results that were available during my care of the patient were reviewed by me and considered in my medical decision making (see chart for details).  All findings were discussed with patient and patient understands to follow up with her PCP as directed. Patient is under the care of Dr. Annette Stable, neurosurgery. Patient voices no other emergency medical complaints at this time. Will follow-up to the ER if worsening symptomology occurs. ____________________________________________   FINAL CLINICAL IMPRESSION(S) / ED DIAGNOSES  Final diagnoses:  Back pain at L4-L5 level  DDD (degenerative disc disease), lumbar      Arlyss Repress, PA-C 02/01/15 1042  Lisa Roca, MD 02/01/15 1540

## 2015-02-04 ENCOUNTER — Ambulatory Visit: Payer: Medicare Other | Admitting: Internal Medicine

## 2015-02-04 ENCOUNTER — Ambulatory Visit: Payer: Medicare Other

## 2015-02-20 ENCOUNTER — Other Ambulatory Visit: Payer: Self-pay | Admitting: Neurosurgery

## 2015-02-20 DIAGNOSIS — M5137 Other intervertebral disc degeneration, lumbosacral region: Secondary | ICD-10-CM

## 2015-02-21 ENCOUNTER — Ambulatory Visit
Admission: RE | Admit: 2015-02-21 | Discharge: 2015-02-21 | Disposition: A | Discharge status: Home / Self Care | Payer: Medicare Other | Source: Ambulatory Visit | Attending: Neurosurgery | Admitting: Neurosurgery

## 2015-02-21 DIAGNOSIS — Z981 Arthrodesis status: Secondary | ICD-10-CM | POA: Insufficient documentation

## 2015-02-21 DIAGNOSIS — M5137 Other intervertebral disc degeneration, lumbosacral region: Secondary | ICD-10-CM | POA: Diagnosis present

## 2015-03-06 ENCOUNTER — Other Ambulatory Visit: Payer: Self-pay | Admitting: Neurosurgery

## 2015-03-06 DIAGNOSIS — M5137 Other intervertebral disc degeneration, lumbosacral region: Secondary | ICD-10-CM

## 2015-03-17 ENCOUNTER — Emergency Department: Payer: Medicare Other

## 2015-03-17 ENCOUNTER — Emergency Department
Admission: EM | Admit: 2015-03-17 | Discharge: 2015-03-17 | Disposition: A | Discharge status: Home / Self Care | Payer: Medicare Other | Attending: Emergency Medicine | Admitting: Emergency Medicine

## 2015-03-17 ENCOUNTER — Encounter: Payer: Self-pay | Admitting: Emergency Medicine

## 2015-03-17 DIAGNOSIS — W1839XA Other fall on same level, initial encounter: Secondary | ICD-10-CM | POA: Diagnosis not present

## 2015-03-17 DIAGNOSIS — F419 Anxiety disorder, unspecified: Secondary | ICD-10-CM | POA: Insufficient documentation

## 2015-03-17 DIAGNOSIS — Z7951 Long term (current) use of inhaled steroids: Secondary | ICD-10-CM | POA: Insufficient documentation

## 2015-03-17 DIAGNOSIS — Z720 Tobacco use: Secondary | ICD-10-CM | POA: Diagnosis not present

## 2015-03-17 DIAGNOSIS — Y998 Other external cause status: Secondary | ICD-10-CM | POA: Insufficient documentation

## 2015-03-17 DIAGNOSIS — Y9339 Activity, other involving climbing, rappelling and jumping off: Secondary | ICD-10-CM | POA: Diagnosis not present

## 2015-03-17 DIAGNOSIS — Z79899 Other long term (current) drug therapy: Secondary | ICD-10-CM | POA: Diagnosis not present

## 2015-03-17 DIAGNOSIS — I10 Essential (primary) hypertension: Secondary | ICD-10-CM | POA: Diagnosis not present

## 2015-03-17 DIAGNOSIS — S3992XA Unspecified injury of lower back, initial encounter: Secondary | ICD-10-CM | POA: Insufficient documentation

## 2015-03-17 DIAGNOSIS — Z9889 Other specified postprocedural states: Secondary | ICD-10-CM | POA: Diagnosis not present

## 2015-03-17 DIAGNOSIS — Y9289 Other specified places as the place of occurrence of the external cause: Secondary | ICD-10-CM | POA: Insufficient documentation

## 2015-03-17 MED ORDER — CYCLOBENZAPRINE HCL 10 MG PO TABS: 10.0000 mg | ORAL_TABLET | Freq: Three times a day (TID) | ORAL | Status: DC | PRN

## 2015-03-17 MED ORDER — KETOROLAC TROMETHAMINE 30 MG/ML IJ SOLN
30.0000 mg | Freq: Once | INTRAMUSCULAR | Status: AC
  Administered 2015-03-17: 30 mg via INTRAMUSCULAR
  Filled 2015-03-17: qty 1

## 2015-03-17 NOTE — ED Provider Notes (Signed)
[facility] Emergency Department Provider Note  ____________________________________________  Time seen: On arrival  I have reviewed the triage vital signs and the nursing notes.   HISTORY  Chief Complaint Back Pain    HPI Melanie Bowen is a 54 y.o. female who presents with complaints of back pain. This started last night after a fall. She reports her kids were jumping around near her and she lost her balance and fell backwards. She is concerned because she had back surgery in May at [facility]. She reports she needs stronger pain medication. She is ambulate without difficulty. No focal deficits. No sensory deficits. No nausea no vomiting no other complaints.     Past Medical History  Diagnosis Date  . Asthma   . Cough   . Genital herpes   . HIV (human immunodeficiency virus infection)   . Bronchitis   . Arthritis   . GERD (gastroesophageal reflux disease)   . Eye irritation   . Paresthesia of hand, bilateral   . Blepharitis   . Allergic rhinitis   . Hypertension   . Cigarette smoker   . Headache   . Shortness of breath dyspnea     at times due to asthma  . Depression     Patient Active Problem List   Diagnosis Date Noted  . DDD (degenerative disc disease), lumbar 12/31/2014  . Degenerative disc disease, lumbar 12/31/2014  . Asthma, chronic 11/12/2014    Past Surgical History  Procedure Laterality Date  . Knee surgery Bilateral 2003,2007    total Joint  . Hand surgery Bilateral     carpal tunnel  . Tubal ligation    . Laparoscopy for ectopic pregnancy    . Bunionectomy Bilateral     feet  . Finger fracture Right     5th finger  . Back surgery may 2016      Current Outpatient Rx  Name  Route  Sig  Dispense  Refill  . albuterol (PROVENTIL HFA;VENTOLIN HFA) 108 (90 BASE) MCG/ACT inhaler   Inhalation   Inhale 90 mcg into the lungs every 6 (six) hours as needed for wheezing or shortness of breath.          Marland Kitchen amLODipine  (NORVASC) 10 MG tablet   Oral   Take 10 mg by mouth daily.         . diazepam (VALIUM) 5 MG tablet   Oral   Take 1 tablet (5 mg total) by mouth every 8 (eight) hours as needed for anxiety.   30 tablet   0   . efavirenz-emtricitabine-tenofovir (ATRIPLA) 600-200-300 MG per tablet   Oral   Take 1 tablet by mouth at bedtime.         . fluticasone (FLONASE) 50 MCG/ACT nasal spray   Nasal   Place 50 g into the nose daily as needed for allergies or rhinitis.          . Fluticasone-Salmeterol (ADVAIR) 500-50 MCG/DOSE AEPB   Inhalation   Inhale 500 mcg into the lungs 2 (two) times daily.          . hydrochlorothiazide (HYDRODIURIL) 25 MG tablet   Oral   Take 25 mg by mouth daily.         Marland Kitchen HYDROcodone-acetaminophen (NORCO) 10-325 MG per tablet   Oral   Take 1 tablet by mouth every 12 (twelve) hours as needed.      0   . ibuprofen (ADVIL,MOTRIN) 800 MG tablet   Oral  Take 1 tablet (800 mg total) by mouth every 8 (eight) hours as needed.   30 tablet   0   . ipratropium-albuterol (DUONEB) 0.5-2.5 (3) MG/3ML SOLN   Inhalation   Inhale 3 mLs into the lungs 3 (three) times daily as needed (shortness of breath, wheezing).          Marland Kitchen losartan (COZAAR) 50 MG tablet   Oral   Take 50 mg by mouth daily.         Marland Kitchen omeprazole (PRILOSEC) 40 MG capsule   Oral   Take 40 mg by mouth daily.         Marland Kitchen oxyCODONE-acetaminophen (ROXICET) 5-325 MG per tablet   Oral   Take 1-2 tablets by mouth every 4 (four) hours as needed for severe pain.   15 tablet   0   . promethazine (PHENERGAN) 25 MG tablet   Oral   Take 25 mg by mouth as needed for nausea or vomiting.          . valACYclovir (VALTREX) 1000 MG tablet   Oral   Take 1,000 mg by mouth daily as needed (outbreak).            Allergies Gabapentin and Zanaflex   No family history on file.  Social History History  Substance Use Topics  . Smoking status: Current Some Day Smoker -- 0.20 packs/day for 38 years     Types: Cigarettes  . Smokeless tobacco: Never Used  . Alcohol Use: No    Review of Systems  Constitutional: Negative for fever. Eyes: Negative for visual changes. ENT: Negative for sore throat Cardiovascular: Negative for chest pain. Respiratory: Negative for shortness of breath. Gastrointestinal: Negative for abdominal pain, vomiting and diarrhea. Genitourinary: Negative for dysuria. Musculoskeletal: Positive for back pain. Skin: Negative for abrasion Neurological: Negative for headaches or focal weakness Psychiatric: Positive for anxiety  10-point ROS otherwise negative.  ____________________________________________   PHYSICAL EXAM:  VITAL SIGNS: ED Triage Vitals  Enc Vitals Group     BP 03/17/15 1227 140/97 mmHg     Pulse Rate 03/17/15 1227 110     Resp 03/17/15 1227 20     Temp 03/17/15 1227 98.2 F (36.8 C)     Temp Source 03/17/15 1227 Oral     SpO2 03/17/15 1227 97 %     Weight 03/17/15 1227 270 lb (122.471 kg)     Height 03/17/15 1227 5\' 6"  (1.676 m)     Head Cir --      Peak Flow --      Pain Score 03/17/15 1228 9     Pain Loc --      Pain Edu? --      Excl. in [place]? --      Constitutional: Alert and oriented. Well appearing and in no distress. Eyes: Conjunctivae are normal.  ENT   Head: Normocephalic and atraumatic.   Mouth/Throat: Mucous membranes are moist. .  Gastrointestinal: Soft and non-tender in all quadrants. No distention. There is no CVA tenderness. Genitourinary: deferred Musculoskeletal: Nontender with normal range of motion in all extremities. No lower extremity tenderness nor edema. No vertebral tenderness to palpation. Normal flexion and extension. No saddle anesthesia Neurologic:  Normal speech and language. No gross focal neurologic deficits are appreciated. Skin:  Skin is warm, dry and intact. No rash noted. Psychiatric: Mood and affect are normal. Patient exhibits appropriate insight and  judgment.  ____________________________________________    LABS (pertinent positives/negatives)  Labs Reviewed - No data to  display  ____________________________________________   EKG  None  ____________________________________________    RADIOLOGY I have personally reviewed any xrays that were ordered on this patient: Lumbar x-ray is unremarkable  ____________________________________________   PROCEDURES  Procedure(s) performed: none  Critical Care performed: Nonenone  ____________________________________________   INITIAL IMPRESSION / ASSESSMENT AND PLAN / ED COURSE  Pertinent labs & imaging results that were available during my care of the patient were reviewed by me and considered in my medical decision making (see chart for details).    ----------------------------------------- 1:54 PM on 03/17/2015 ----------------------------------------- X-ray unremarkable, recommended muscle relaxers and NSAIDs for back pain. Patient states she needs oxycodone which I feel is inappropriate and told her I was not comfortable prescribing for that, at which point she became very angry and left the emergency department and said she was going to [facility]. ____________________________________________   FINAL CLINICAL IMPRESSION(S) / ED DIAGNOSES  Final diagnoses:  Back injury, initial encounter     Lavonia Drafts, MD 03/17/15 1503

## 2015-03-17 NOTE — ED Notes (Signed)
Pt with recent back surgery and fell last night. Pt presents with back pain this am and wants to be checked out and also needs a refill on her oxycontin 15mg .

## 2015-03-17 NOTE — Discharge Instructions (Signed)
Contusion °A contusion is a deep bruise. Contusions are the result of an injury that caused bleeding under the skin. The contusion may turn blue, purple, or yellow. Minor injuries will give you a painless contusion, but more severe contusions may stay painful and swollen for a few weeks.  °CAUSES  °A contusion is usually caused by a blow, trauma, or direct force to an area of the body. °SYMPTOMS  °· Swelling and redness of the injured area. °· Bruising of the injured area. °· Tenderness and soreness of the injured area. °· Pain. °DIAGNOSIS  °The diagnosis can be made by taking a history and physical exam. An X-ray, CT scan, or MRI may be needed to determine if there were any associated injuries, such as fractures. °TREATMENT  °Specific treatment will depend on what area of the body was injured. In general, the best treatment for a contusion is resting, icing, elevating, and applying cold compresses to the injured area. Over-the-counter medicines may also be recommended for pain control. Ask your caregiver what the best treatment is for your contusion. °HOME CARE INSTRUCTIONS  °· Put ice on the injured area. °¨ Put ice in a plastic bag. °¨ Place a towel between your skin and the bag. °¨ Leave the ice on for 15-20 minutes, 3-4 times a day, or as directed by your health care provider. °· Only take over-the-counter or prescription medicines for pain, discomfort, or fever as directed by your caregiver. Your caregiver may recommend avoiding anti-inflammatory medicines (aspirin, ibuprofen, and naproxen) for 48 hours because these medicines may increase bruising. °· Rest the injured area. °· If possible, elevate the injured area to reduce swelling. °SEEK IMMEDIATE MEDICAL CARE IF:  °· You have increased bruising or swelling. °· You have pain that is getting worse. °· Your swelling or pain is not relieved with medicines. °MAKE SURE YOU:  °· Understand these instructions. °· Will watch your condition. °· Will get help right  away if you are not doing well or get worse. °Document Released: 05/26/2005 Document Revised: 08/21/2013 Document Reviewed: 06/21/2011 °[employer]® Patient Information ©2015 [employer]. This information is not intended to replace advice given to you by your health care provider. Make sure you discuss any questions you have with your health care provider. ° °
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
## 2015-03-17 NOTE — ED Notes (Signed)
Patient transported to X-ray 

## 2015-03-27 ENCOUNTER — Ambulatory Visit
Admission: RE | Admit: 2015-03-27 | Discharge: 2015-03-27 | Disposition: A | Discharge status: Home / Self Care | Payer: Medicare Other | Source: Ambulatory Visit | Attending: Neurosurgery | Admitting: Neurosurgery

## 2015-03-27 DIAGNOSIS — M5137 Other intervertebral disc degeneration, lumbosacral region: Secondary | ICD-10-CM

## 2015-04-15 ENCOUNTER — Ambulatory Visit: Payer: Medicare Other | Admitting: Internal Medicine

## 2015-04-15 ENCOUNTER — Ambulatory Visit: Payer: Medicare Other

## 2015-05-01 ENCOUNTER — Other Ambulatory Visit: Payer: Self-pay | Admitting: Family Medicine

## 2015-05-01 ENCOUNTER — Ambulatory Visit
Admission: RE | Admit: 2015-05-01 | Discharge: 2015-05-01 | Disposition: A | Discharge status: Home / Self Care | Payer: Medicare Other | Source: Ambulatory Visit | Attending: Family Medicine | Admitting: Family Medicine

## 2015-05-01 DIAGNOSIS — Z1231 Encounter for screening mammogram for malignant neoplasm of breast: Secondary | ICD-10-CM

## 2015-05-19 ENCOUNTER — Ambulatory Visit: Payer: Medicare Other

## 2015-05-19 ENCOUNTER — Ambulatory Visit: Payer: Medicare Other | Admitting: Internal Medicine

## 2015-06-09 ENCOUNTER — Ambulatory Visit (INDEPENDENT_AMBULATORY_CARE_PROVIDER_SITE_OTHER): Payer: Medicare Other | Admitting: Internal Medicine

## 2015-06-09 ENCOUNTER — Other Ambulatory Visit: Payer: Self-pay | Admitting: Internal Medicine

## 2015-06-09 DIAGNOSIS — R0600 Dyspnea, unspecified: Secondary | ICD-10-CM

## 2015-06-09 DIAGNOSIS — J4540 Moderate persistent asthma, uncomplicated: Secondary | ICD-10-CM

## 2015-06-18 ENCOUNTER — Telehealth: Payer: Self-pay | Admitting: *Deleted

## 2015-06-18 NOTE — Telephone Encounter (Signed)
Patient is questioning her appt. For tomorrow? She is having a hard time walking and is using her walker. Please call her. Thanks

## 2015-06-18 NOTE — Telephone Encounter (Signed)
Pt asking what she would be doing at her appt tomorrow. States she is having to use her walker again. Nothing further needed.

## 2015-06-19 ENCOUNTER — Encounter: Payer: Self-pay | Admitting: Internal Medicine

## 2015-06-19 ENCOUNTER — Ambulatory Visit (INDEPENDENT_AMBULATORY_CARE_PROVIDER_SITE_OTHER): Payer: Medicare Other | Admitting: Internal Medicine

## 2015-06-19 VITALS — BP 124/80 | HR 94 | Ht 66.0 in | Wt 280.0 lb

## 2015-06-19 DIAGNOSIS — J4540 Moderate persistent asthma, uncomplicated: Secondary | ICD-10-CM

## 2015-06-19 DIAGNOSIS — R059 Cough, unspecified: Secondary | ICD-10-CM | POA: Insufficient documentation

## 2015-06-19 DIAGNOSIS — R05 Cough: Secondary | ICD-10-CM | POA: Insufficient documentation

## 2015-06-19 DIAGNOSIS — Z23 Encounter for immunization: Secondary | ICD-10-CM | POA: Diagnosis not present

## 2015-06-19 MED ORDER — NICOTINE 10 MG IN INHA: 1.0000 | RESPIRATORY_TRACT | Status: DC | PRN

## 2015-06-19 NOTE — Progress Notes (Signed)
[facility] Consultation      MRN# [id number] Melanie Bowen [birth year]   CC: Chief Complaint  Patient presents with  . Follow-up    cough prod at times w/clear to dark mucus; wheezing      Brief History: 10/2013 -  She is a pleasant 54 year old female past medical history of asthma, HIV, in consultation today for recurrent bronchitis/cough/orders of breath. Patient states that she's had recurrent upper respiratory tract infections over the past 2 months, this accompanied with cough (brown to clear productive sputum). Recently she went to the ED for visit due to cough and shortness of breath, she also had a chest x-ray at that time which was very 21st 2016, patient is getting treatment at that time due to being in the waiting room to long and left before treatment was given. Over the past months she's been using albuterol 2 times a day and nebulizer 2-3 times a day. Patient states that she is currently on Advair but she only uses it 3-4 days per week, she denies any intubation for respiratory issues in the past. States that she has about 4 episodes a year of "bronchitis" requiring steroids. She currently smokes half pack to a quarter pack per day for the past 40 years, she quits intermittently for months at a time. In the past she has used cocaine but currently denies any current use. No pets at home, previously worked in a [employer].   Review of records (personally reviewed by Dr. Edwin Dada time spent reviewing 30 minutes) Office visit 10/21/2014-recently completed antibiotics and prednisone 5 day course still feeling chest pressure, shortness of breath, wheezing, using inhalers daily, using nebulizers twice a day, no fever, still smoking. Plan prednisone taper, nicotine patch, referral to pulmonary. Office visit 10/15/2014-presented with complaints of cough, rhinorrhea, sinus pressure, right ear pain, wheezing, body aches and malaise, purulent sputum, cough,  shortness of breath, still smoking. Plan Levaquin, steroid burst, DuoNeb's U4 to 6 hours while awake for the next few days, continue Advair. Office visit 09/11/2014-complaints of cough, recently completed antibiotics and prednisone, still with some shortness of breath and wheezing, however this is improved, sputum is decrease. Plan quit smoking, humidify prescription, DuoNeb solution, start Advair.  Plan: - pulmonary function testing and 6 minute walk test prior to follow up - albuterol inhaler - 2puff every 3-4 hours as needed for shortness of breath\wheezing\recurrent cough - continue Advair Diskus - 1 puff twice a day (rinse and gargle after each use). - If patient continues to have a upper respiratory tract infections, bronchitis, will need to consider bronchoscopy with BAL, this can be discussed at her follow-up visit  Events since last clinic visit: Patient presents today for a follow up visit of cough and asthma. Accompanied by nurse aid.  Still with cough (with mild sputum production - clear to thick white).  Smoking about 2 cigs per day. Using albuterol rescue inhaler 2 times per day, in addition to using the albuterol neb 2 times per day. States that other members in her house smoke also. Would like to quit smoking and asked for assistance.      Medication:   Current Outpatient Rx  Name  Route  Sig  Dispense  Refill  . albuterol (PROVENTIL HFA;VENTOLIN HFA) 108 (90 BASE) MCG/ACT inhaler   Inhalation   Inhale 90 mcg into the lungs every 6 (six) hours as needed for wheezing or shortness of breath.          Marland Kitchen amLODipine (  NORVASC) 10 MG tablet   Oral   Take 10 mg by mouth daily.         . cyclobenzaprine (FLEXERIL) 10 MG tablet   Oral   Take 1 tablet (10 mg total) by mouth every 8 (eight) hours as needed for muscle spasms.   20 tablet   1   . diazepam (VALIUM) 5 MG tablet   Oral   Take 1 tablet (5 mg total) by mouth every 8 (eight) hours as needed for anxiety.   30  tablet   0   . efavirenz-emtricitabine-tenofovir (ATRIPLA) 600-200-300 MG per tablet   Oral   Take 1 tablet by mouth at bedtime.         . fluticasone (FLONASE) 50 MCG/ACT nasal spray   Nasal   Place 50 g into the nose daily as needed for allergies or rhinitis.          . Fluticasone-Salmeterol (ADVAIR) 500-50 MCG/DOSE AEPB   Inhalation   Inhale 500 mcg into the lungs 2 (two) times daily.          . hydrochlorothiazide (HYDRODIURIL) 25 MG tablet   Oral   Take 25 mg by mouth daily.         Marland Kitchen HYDROcodone-acetaminophen (NORCO) 10-325 MG per tablet   Oral   Take 1 tablet by mouth every 12 (twelve) hours as needed.      0   . ibuprofen (ADVIL,MOTRIN) 800 MG tablet   Oral   Take 1 tablet (800 mg total) by mouth every 8 (eight) hours as needed.   30 tablet   0   . ipratropium-albuterol (DUONEB) 0.5-2.5 (3) MG/3ML SOLN   Inhalation   Inhale 3 mLs into the lungs 3 (three) times daily as needed (shortness of breath, wheezing).          Marland Kitchen losartan (COZAAR) 50 MG tablet   Oral   Take 50 mg by mouth daily.         Marland Kitchen omeprazole (PRILOSEC) 40 MG capsule   Oral   Take 40 mg by mouth daily.         Marland Kitchen oxyCODONE-acetaminophen (ROXICET) 5-325 MG per tablet   Oral   Take 1-2 tablets by mouth every 4 (four) hours as needed for severe pain.   15 tablet   0   . promethazine (PHENERGAN) 25 MG tablet   Oral   Take 25 mg by mouth as needed for nausea or vomiting.          . valACYclovir (VALTREX) 1000 MG tablet   Oral   Take 1,000 mg by mouth daily as needed (outbreak).             Review of Systems  Constitutional: Negative for fever and chills.  Eyes: Negative for blurred vision and double vision.  Respiratory: Positive for cough, sputum production, shortness of breath and wheezing.   Cardiovascular: Negative for chest pain and palpitations.  Gastrointestinal: Negative for heartburn.  Genitourinary: Negative for dysuria.  Skin: Negative for rash.    Neurological: Negative for headaches.      Allergies:  Gabapentin and Zanaflex   Physical Examination:  VS: BP 124/80 mmHg  Pulse 94  Ht 5\' 6"  (1.676 m)  Wt 280 lb (127.007 kg)  BMI 45.21 kg/m2  SpO2 96%  General Appearance: No distress  HEENT: PERRLA, no ptosis, no other lesions noticed Pulmonary:normal breath sounds., diaphragmatic excursion normal.No wheezing, No rales   Cardiovascular:  Normal S1,S2.  No m/r/g.  Abdomen:Exam: Benign, Soft, non-tender, No masses  Skin:   warm, no rashes, no ecchymosis  Extremities: normal, no cyanosis, clubbing, warm with normal capillary refill.        Assessment and Plan: Cough The standardized cough guidelines published in Chest by Lissa Morales in 2006 are still the best available and consist of a multiple step process (up to 12!) , not a single office visit,  and are intended  to address this problem logically,  with an alogrithm dependent on response to empiric treatment at  each progressive step  to determine a specific diagnosis with  minimal addtional testing needed. Therefore if adherence is an issue or can't be accurately verified,  it's very unlikely the standard evaluation and treatment will be successful here.    Furthermore, response to therapy (other than acute cough suppression, which should only be used short term with avoidance of narcotic containing cough syrups if possible), can be a gradual process for which the patient may perceive immediate benefit.  At this time I believe that her recurrent cough is due to continued tobacco use. However, given her hx of HIV, other infectious etiologies will need to rule out.   Plan - CT chest with contrast - Bronch with BAL - cont with Advair. - tobacco cessation - Nicotrol inhaler rx given today.    Asthma, chronic Currently her asthma is not controlled well, she has required multiple rounds of antibiotics and steroids.  I believe that her continued smoking is triggering  bronchospasms along with recurrent respiratory infections. Today I counseled the patient heavily on quit tobacco and current effects it can have on her current respiratory status.  Also today, educated patient heavily on need and use of a rescue nebulizer and rescue inhaler along with his current indications and administrations. She does have a history of HIV, currently following with infectious disease, follows up with Dr. [name]  Given her recurrent cough, which is mostly due to tobacco use, I believe that other infectious etiologies should be ruled out with a bronch with BAL, especially with a hx of HIV (+).  Plan: - CT chest with contrast and Bronch with BAL - albuterol inhaler - 2puff every 3-4 hours as needed for shortness of breath\wheezing\recurrent cough - continue Advair Diskus - 1 puff twice a day (rinse and gargle after each use). - If patient continues to have a upper respiratory tract infections, bronchitis, will need to consider bronchoscopy with BAL, this can be discussed at her follow-up visit      Updated Medication List Outpatient Encounter Prescriptions as of 06/19/2015  Medication Sig  . albuterol (PROVENTIL HFA;VENTOLIN HFA) 108 (90 BASE) MCG/ACT inhaler Inhale 90 mcg into the lungs every 6 (six) hours as needed for wheezing or shortness of breath.   Marland Kitchen amLODipine (NORVASC) 10 MG tablet Take 10 mg by mouth daily.  . cyclobenzaprine (FLEXERIL) 10 MG tablet Take 1 tablet (10 mg total) by mouth every 8 (eight) hours as needed for muscle spasms.  . diazepam (VALIUM) 5 MG tablet Take 1 tablet (5 mg total) by mouth every 8 (eight) hours as needed for anxiety.  Marland Kitchen efavirenz-emtricitabine-tenofovir (ATRIPLA) 600-200-300 MG per tablet Take 1 tablet by mouth at bedtime.  . fluticasone (FLONASE) 50 MCG/ACT nasal spray Place 50 g into the nose daily as needed for allergies or rhinitis.   . Fluticasone-Salmeterol (ADVAIR) 500-50 MCG/DOSE AEPB Inhale 500 mcg into the lungs 2  (two) times daily.   . hydrochlorothiazide (HYDRODIURIL) 25 MG tablet Take 25  mg by mouth daily.  Marland Kitchen HYDROcodone-acetaminophen (NORCO) 10-325 MG per tablet Take 1 tablet by mouth every 12 (twelve) hours as needed.  Marland Kitchen ibuprofen (ADVIL,MOTRIN) 800 MG tablet Take 1 tablet (800 mg total) by mouth every 8 (eight) hours as needed.  Marland Kitchen ipratropium-albuterol (DUONEB) 0.5-2.5 (3) MG/3ML SOLN Inhale 3 mLs into the lungs 3 (three) times daily as needed (shortness of breath, wheezing).   Marland Kitchen losartan (COZAAR) 50 MG tablet Take 50 mg by mouth daily.  Marland Kitchen omeprazole (PRILOSEC) 40 MG capsule Take 40 mg by mouth daily.  Marland Kitchen oxyCODONE-acetaminophen (ROXICET) 5-325 MG per tablet Take 1-2 tablets by mouth every 4 (four) hours as needed for severe pain.  . promethazine (PHENERGAN) 25 MG tablet Take 25 mg by mouth as needed for nausea or vomiting.   . valACYclovir (VALTREX) 1000 MG tablet Take 1,000 mg by mouth daily as needed (outbreak).    No facility-administered encounter medications on file as of 06/19/2015.    Orders for this visit: Orders Placed This Encounter  Procedures  . CT Chest W Contrast    Standing Status: Future     Number of Occurrences:      Standing Expiration Date: 08/18/2016    Order Specific Question:  Reason for Exam (SYMPTOM  OR DIAGNOSIS REQUIRED)    Answer:  recurrent cough    Order Specific Question:  Is the patient pregnant?    Answer:  No    Order Specific Question:  Preferred imaging location?    Answer:  [facility]  . Flu Vaccine QUAD 36+ mos IM (Fluarix & Fluzone Quad PF    Thank  you for the visitation and for allowing  [facility] to assist in the care of your patient. Our recommendations are noted above.  Please contact us if we can be of further service.  Vilinda Boehringer, MD [facility] Office Number: [phone]

## 2015-06-19 NOTE — Patient Instructions (Signed)
Follow up with Dr. Stevenson Clinch in: 8-12 weeks - We will plan to setup a bronchoscopy with BAL (lavage). - CT chest with contrast for recurrent cough.  - please quit smoking - we will give you prescription for Nicotrol inhaler - please avoid all forms of smoking - tobacco, vapors, pipes, second hand smoking, etc

## 2015-06-19 NOTE — Assessment & Plan Note (Signed)
The standardized cough guidelines published in Chest by Lissa Morales in 2006 are still the best available and consist of a multiple step process (up to 12!) , not a single office visit,  and are intended  to address this problem logically,  with an alogrithm dependent on response to empiric treatment at  each progressive step  to determine a specific diagnosis with  minimal addtional testing needed. Therefore if adherence is an issue or can't be accurately verified,  it's very unlikely the standard evaluation and treatment will be successful here.    Furthermore, response to therapy (other than acute cough suppression, which should only be used short term with avoidance of narcotic containing cough syrups if possible), can be a gradual process for which the patient may perceive immediate benefit.  At this time I believe that her recurrent cough is due to continued tobacco use. However, given her hx of HIV, other infectious etiologies will need to rule out.   Plan - CT chest with contrast - Bronch with BAL - cont with Advair. - tobacco cessation - Nicotrol inhaler rx given today.

## 2015-06-19 NOTE — Assessment & Plan Note (Signed)
Currently her asthma is not controlled well, she has required multiple rounds of antibiotics and steroids.  I believe that her continued smoking is triggering bronchospasms along with recurrent respiratory infections. Today I counseled the patient heavily on quit tobacco and current effects it can have on her current respiratory status.  Also today, educated patient heavily on need and use of a rescue nebulizer and rescue inhaler along with his current indications and administrations. She does have a history of HIV, currently following with infectious disease, follows up with Dr. [name]  Given her recurrent cough, which is mostly due to tobacco use, I believe that other infectious etiologies should be ruled out with a bronch with BAL, especially with a hx of HIV (+).  Plan: - CT chest with contrast and Bronch with BAL - albuterol inhaler - 2puff every 3-4 hours as needed for shortness of breath\wheezing\recurrent cough - continue Advair Diskus - 1 puff twice a day (rinse and gargle after each use). - If patient continues to have a upper respiratory tract infections, bronchitis, will need to consider bronchoscopy with BAL, this can be discussed at her follow-up visit

## 2015-06-19 NOTE — Progress Notes (Signed)
PFT could not be performed due to the patient coughing during the spirometry portion. She did give good effort while trying to perform the test.

## 2015-06-23 ENCOUNTER — Other Ambulatory Visit: Payer: Medicare Other

## 2015-06-25 ENCOUNTER — Encounter
Admission: RE | Admit: 2015-06-25 | Discharge: 2015-06-25 | Disposition: A | Discharge status: Home / Self Care | Payer: Medicare Other | Source: Ambulatory Visit | Attending: Internal Medicine | Admitting: Internal Medicine

## 2015-06-25 DIAGNOSIS — I10 Essential (primary) hypertension: Secondary | ICD-10-CM | POA: Diagnosis not present

## 2015-06-25 DIAGNOSIS — F17210 Nicotine dependence, cigarettes, uncomplicated: Secondary | ICD-10-CM | POA: Diagnosis not present

## 2015-06-25 DIAGNOSIS — R05 Cough: Secondary | ICD-10-CM | POA: Diagnosis present

## 2015-06-25 DIAGNOSIS — A6000 Herpesviral infection of urogenital system, unspecified: Secondary | ICD-10-CM | POA: Diagnosis not present

## 2015-06-25 DIAGNOSIS — Z803 Family history of malignant neoplasm of breast: Secondary | ICD-10-CM | POA: Diagnosis not present

## 2015-06-25 DIAGNOSIS — Z01812 Encounter for preprocedural laboratory examination: Secondary | ICD-10-CM | POA: Diagnosis present

## 2015-06-25 DIAGNOSIS — J40 Bronchitis, not specified as acute or chronic: Secondary | ICD-10-CM | POA: Diagnosis not present

## 2015-06-25 DIAGNOSIS — B20 Human immunodeficiency virus [HIV] disease: Secondary | ICD-10-CM | POA: Diagnosis not present

## 2015-06-25 DIAGNOSIS — J984 Other disorders of lung: Secondary | ICD-10-CM | POA: Diagnosis not present

## 2015-06-25 LAB — BASIC METABOLIC PANEL
Anion gap: 7 (ref 5–15)
BUN: 17 mg/dL (ref 6–20)
CO2: 21 mmol/L — ABNORMAL LOW (ref 22–32)
Calcium: 8.6 mg/dL — ABNORMAL LOW (ref 8.9–10.3)
Chloride: 112 mmol/L — ABNORMAL HIGH (ref 101–111)
Creatinine, Ser: 1.07 mg/dL — ABNORMAL HIGH (ref 0.44–1.00)
GFR calc Af Amer: >60 mL/min (ref >60)
GFR calc non Af Amer: 58 mL/min — ABNORMAL LOW (ref >60)
Glucose, Bld: 110 mg/dL — ABNORMAL HIGH (ref 65–99)
Potassium: 3.7 mmol/L (ref 3.5–5.1)
Sodium: 140 mmol/L (ref 135–145)

## 2015-06-25 LAB — CBC
HCT: 39.7 % (ref 35.0–47.0)
Hemoglobin: 13.2 g/dL (ref 12.0–16.0)
MCH: 29.6 pg (ref 26.0–34.0)
MCHC: 33.3 g/dL (ref 32.0–36.0)
MCV: 88.7 fL (ref 80.0–100.0)
Platelets: 375 10*3/uL (ref 150–440)
RBC: 4.47 MIL/uL (ref 3.80–5.20)
RDW: 18 % — ABNORMAL HIGH (ref 11.5–14.5)
WBC: 6.2 10*3/uL (ref 3.6–11.0)

## 2015-06-25 NOTE — Patient Instructions (Addendum)
  Your procedure is scheduled on: 06/30/15 Mon  Report to Day Surgery.2nd floor [facility] To find out your arrival time please call [phone] between 1PM - 3PM on 06/27/15 Fri  Remember: Instructions that are not followed completely may result in serious medical risk, up to and including death, or upon the discretion of your surgeon and anesthesiologist your surgery may need to be rescheduled.    _x__ 1. Do not eat food or drink liquids after midnight. No gum chewing or hard candies.     _x___ 2. No Alcohol for 24 hours before or after surgery.   ____ 3. Bring all medications with you on the day of surgery if instructed.    _x___ 4. Notify your doctor if there is any change in your medical condition     (cold, fever, infections).     Do not wear jewelry, make-up, hairpins, clips or nail polish.  Do not wear lotions, powders, or perfumes. You may wear deodorant.  Do not shave 48 hours prior to surgery. Men may shave face and neck.  Do not bring valuables to the hospital.    [facility] is not responsible for any belongings or valuables.               Contacts, dentures or bridgework may not be worn into surgery.  Leave your suitcase in the car. After surgery it may be brought to your room.  For patients admitted to the hospital, discharge time is determined by your                treatment team.   Patients discharged the day of surgery will not be allowed to drive home.   Please read over the following fact sheets that you were given:      _x___ Take these medicines the morning of surgery with A SIP OF WATER:    1. albuterol (PROVENTIL HFA;VENTOLIN HFA) 108 (90 BASE) MCG/ACT inhaler  2. amLODipine (NORVASC) 10 MG tablet  3. ipratropium-albuterol (DUONEB) 0.5-2.5 (3) MG/3ML SOLN  4.omeprazole (PRILOSEC) 40 MG capsule  5.  6.  ____ Fleet Enema (as directed)   _x___ Use CHG Soap as directed  _x___ Use inhalers on the day of surgery  ____ Stop metformin 2 days prior to  surgery    ____ Take 1/2 of usual insulin dose the night before surgery and none on the morning of surgery.   ____ Stop Coumadin/Plavix/aspirin on   _x___ Stop Anti-inflammatories on Stop ibuprofen take Tylenol as needed   ____ Stop supplements until after surgery.    ____ Bring C-Pap to the hospital.

## 2015-06-26 MED ORDER — LIDOCAINE HCL (PF) 1 % IJ SOLN
INTRAMUSCULAR | Status: AC
  Filled 2015-06-26: qty 30

## 2015-06-26 MED ORDER — BUPIVACAINE HCL (PF) 0.5 % IJ SOLN
INTRAMUSCULAR | Status: AC
  Filled 2015-06-26: qty 30

## 2015-06-27 ENCOUNTER — Ambulatory Visit
Admission: RE | Admit: 2015-06-27 | Discharge: 2015-06-27 | Disposition: A | Discharge status: Home / Self Care | Payer: Medicare Other | Source: Ambulatory Visit | Attending: Internal Medicine | Admitting: Internal Medicine

## 2015-06-27 DIAGNOSIS — Z803 Family history of malignant neoplasm of breast: Secondary | ICD-10-CM | POA: Insufficient documentation

## 2015-06-27 DIAGNOSIS — A6000 Herpesviral infection of urogenital system, unspecified: Secondary | ICD-10-CM | POA: Insufficient documentation

## 2015-06-27 DIAGNOSIS — R05 Cough: Secondary | ICD-10-CM

## 2015-06-27 DIAGNOSIS — J4540 Moderate persistent asthma, uncomplicated: Secondary | ICD-10-CM

## 2015-06-27 DIAGNOSIS — Z01812 Encounter for preprocedural laboratory examination: Secondary | ICD-10-CM | POA: Insufficient documentation

## 2015-06-27 DIAGNOSIS — F17210 Nicotine dependence, cigarettes, uncomplicated: Secondary | ICD-10-CM | POA: Insufficient documentation

## 2015-06-27 DIAGNOSIS — R059 Cough, unspecified: Secondary | ICD-10-CM

## 2015-06-27 DIAGNOSIS — B20 Human immunodeficiency virus [HIV] disease: Secondary | ICD-10-CM | POA: Diagnosis not present

## 2015-06-27 DIAGNOSIS — J40 Bronchitis, not specified as acute or chronic: Secondary | ICD-10-CM | POA: Insufficient documentation

## 2015-06-27 DIAGNOSIS — I10 Essential (primary) hypertension: Secondary | ICD-10-CM | POA: Insufficient documentation

## 2015-06-27 DIAGNOSIS — J984 Other disorders of lung: Secondary | ICD-10-CM | POA: Insufficient documentation

## 2015-06-27 MED ORDER — IOHEXOL 300 MG/ML  SOLN
75.0000 mL | Freq: Once | INTRAMUSCULAR | Status: AC | PRN
  Administered 2015-06-27: 75 mL via INTRAVENOUS

## 2015-06-30 ENCOUNTER — Encounter
Admission: RE | Disposition: A | Discharge status: Home / Self Care | Payer: Self-pay | Source: Ambulatory Visit | Attending: Internal Medicine

## 2015-06-30 ENCOUNTER — Ambulatory Visit
Admission: RE | Admit: 2015-06-30 | Discharge: 2015-06-30 | Disposition: A | Discharge status: Home / Self Care | Payer: Medicare Other | Source: Ambulatory Visit | Attending: Internal Medicine | Admitting: Internal Medicine

## 2015-06-30 ENCOUNTER — Encounter: Payer: Self-pay | Admitting: *Deleted

## 2015-06-30 ENCOUNTER — Ambulatory Visit: Payer: Medicare Other | Admitting: Anesthesiology

## 2015-06-30 DIAGNOSIS — Z7951 Long term (current) use of inhaled steroids: Secondary | ICD-10-CM | POA: Diagnosis not present

## 2015-06-30 DIAGNOSIS — Z21 Asymptomatic human immunodeficiency virus [HIV] infection status: Secondary | ICD-10-CM | POA: Diagnosis not present

## 2015-06-30 DIAGNOSIS — J45909 Unspecified asthma, uncomplicated: Secondary | ICD-10-CM | POA: Insufficient documentation

## 2015-06-30 DIAGNOSIS — R51 Headache: Secondary | ICD-10-CM | POA: Insufficient documentation

## 2015-06-30 DIAGNOSIS — K219 Gastro-esophageal reflux disease without esophagitis: Secondary | ICD-10-CM | POA: Insufficient documentation

## 2015-06-30 DIAGNOSIS — Z888 Allergy status to other drugs, medicaments and biological substances status: Secondary | ICD-10-CM | POA: Diagnosis not present

## 2015-06-30 DIAGNOSIS — F17200 Nicotine dependence, unspecified, uncomplicated: Secondary | ICD-10-CM | POA: Insufficient documentation

## 2015-06-30 DIAGNOSIS — F329 Major depressive disorder, single episode, unspecified: Secondary | ICD-10-CM | POA: Insufficient documentation

## 2015-06-30 DIAGNOSIS — R0602 Shortness of breath: Secondary | ICD-10-CM | POA: Insufficient documentation

## 2015-06-30 DIAGNOSIS — Z79899 Other long term (current) drug therapy: Secondary | ICD-10-CM | POA: Insufficient documentation

## 2015-06-30 DIAGNOSIS — R05 Cough: Secondary | ICD-10-CM | POA: Insufficient documentation

## 2015-06-30 DIAGNOSIS — Z1619 Resistance to other specified beta lactam antibiotics: Secondary | ICD-10-CM | POA: Diagnosis not present

## 2015-06-30 DIAGNOSIS — B9689 Other specified bacterial agents as the cause of diseases classified elsewhere: Secondary | ICD-10-CM | POA: Insufficient documentation

## 2015-06-30 DIAGNOSIS — M1990 Unspecified osteoarthritis, unspecified site: Secondary | ICD-10-CM | POA: Insufficient documentation

## 2015-06-30 DIAGNOSIS — R053 Chronic cough: Secondary | ICD-10-CM | POA: Insufficient documentation

## 2015-06-30 HISTORY — PX: VIDEO BRONCHOSCOPY: SHX5072

## 2015-06-30 SURGERY — VIDEO BRONCHOSCOPY WITHOUT FLUORO
Anesthesia: General

## 2015-06-30 MED ORDER — OXYCODONE HCL 5 MG/5ML PO SOLN: 5.0000 mg | Freq: Once | ORAL | Status: DC | PRN

## 2015-06-30 MED ORDER — SUCCINYLCHOLINE CHLORIDE 20 MG/ML IJ SOLN
INTRAMUSCULAR | Status: DC | PRN
  Administered 2015-06-30: 120 mg via INTRAVENOUS

## 2015-06-30 MED ORDER — LACTATED RINGERS IV SOLN
INTRAVENOUS | Status: DC
  Administered 2015-06-30 (×3): via INTRAVENOUS

## 2015-06-30 MED ORDER — FENTANYL CITRATE (PF) 100 MCG/2ML IJ SOLN: 25.0000 ug | INTRAMUSCULAR | Status: DC | PRN

## 2015-06-30 MED ORDER — PROPOFOL 500 MG/50ML IV EMUL
INTRAVENOUS | Status: DC | PRN
  Administered 2015-06-30: 150 ug/kg/min via INTRAVENOUS

## 2015-06-30 MED ORDER — SODIUM CHLORIDE 0.9 % IV SOLN: INTRAVENOUS | Status: DC | PRN

## 2015-06-30 MED ORDER — IPRATROPIUM-ALBUTEROL 0.5-2.5 (3) MG/3ML IN SOLN
3.0000 mL | Freq: Once | RESPIRATORY_TRACT | Status: AC
  Administered 2015-06-30: 3 mL via RESPIRATORY_TRACT

## 2015-06-30 MED ORDER — PROPOFOL 10 MG/ML IV BOLUS
INTRAVENOUS | Status: DC | PRN
  Administered 2015-06-30: 160 mg via INTRAVENOUS

## 2015-06-30 MED ORDER — IPRATROPIUM-ALBUTEROL 0.5-2.5 (3) MG/3ML IN SOLN: 3.0000 mL | Freq: Once | RESPIRATORY_TRACT | Status: DC | PRN

## 2015-06-30 MED ORDER — OXYCODONE HCL 5 MG PO TABS: 5.0000 mg | ORAL_TABLET | Freq: Once | ORAL | Status: DC | PRN

## 2015-06-30 MED ORDER — LIDOCAINE HCL (CARDIAC) 20 MG/ML IV SOLN
INTRAVENOUS | Status: DC | PRN
  Administered 2015-06-30: 100 mg via INTRAVENOUS

## 2015-06-30 MED ORDER — MIDAZOLAM HCL 2 MG/2ML IJ SOLN
INTRAMUSCULAR | Status: DC | PRN
  Administered 2015-06-30: 2 mg via INTRAVENOUS

## 2015-06-30 MED ORDER — IPRATROPIUM-ALBUTEROL 0.5-2.5 (3) MG/3ML IN SOLN
RESPIRATORY_TRACT | Status: AC
  Filled 2015-06-30: qty 3

## 2015-06-30 NOTE — H&P (Signed)
See clinic note from 06/19/15.  Patient seen and evaluated, no change in H&P since 06/19/15 Recurrent cough in an immunocomprised patient, bronch with BAL.   Vilinda Boehringer, MD [facility] Pager [phone] (please enter 7-digits) On Call Pager - [phone] (please enter 7-digits).

## 2015-06-30 NOTE — Anesthesia Procedure Notes (Signed)
Procedure Name: Intubation Date/Time: 06/30/2015 1:23 PM Performed by: Allean Found Pre-anesthesia Checklist: Patient identified, Emergency Drugs available, Suction available, Patient being monitored and Timeout performed Patient Re-evaluated:Patient Re-evaluated prior to inductionOxygen Delivery Method: Circle system utilized Preoxygenation: Pre-oxygenation with 100% oxygen Intubation Type: IV induction LMA: LMA inserted Number of attempts: 1 Airway Equipment and Method: Stylet and LTA kit utilized Placement Confirmation: ETT inserted through vocal cords under direct vision,  positive ETCO2 and breath sounds checked- equal and bilateral Secured at: 22 cm Tube secured with: Tape Dental Injury: Teeth and Oropharynx as per pre-operative assessment

## 2015-06-30 NOTE — Anesthesia Preprocedure Evaluation (Addendum)
Anesthesia Evaluation  Patient identified by MRN, date of birth, ID band Patient awake    Reviewed: Allergy & Precautions, H&P , NPO status , Patient's Chart, lab work & pertinent test results  History of Anesthesia Complications Negative for: history of anesthetic complications  Airway Mallampati: II  TM Distance: >3 FB Neck ROM: full    Dental  (+) Teeth Intact, Poor Dentition, Chipped, Caps   Pulmonary shortness of breath, asthma , Current Smoker,    Pulmonary exam normal breath sounds clear to auscultation       Cardiovascular Exercise Tolerance: Poor hypertension, (-) angina+ DOE  (-) Past MI Normal cardiovascular exam Rhythm:regular Rate:Normal     Neuro/Psych  Headaches, PSYCHIATRIC DISORDERS Depression    GI/Hepatic negative GI ROS, Neg liver ROS, GERD  Controlled,  Endo/Other  Morbid obesity  Renal/GU negative Renal ROS  negative genitourinary   Musculoskeletal  (+) Arthritis ,   Abdominal   Peds  Hematology negative hematology ROS (+)   Anesthesia Other Findings Past Medical History:   Asthma                                                       Cough                                                        Genital herpes                                               HIV (human immunodeficiency virus infection) (*              Bronchitis                                                   Arthritis                                                    GERD (gastroesophageal reflux disease)                       Eye irritation                                               Paresthesia of hand, bilateral                               Blepharitis  Allergic rhinitis                                            Hypertension                                                 Cigarette smoker                                             Headache                                                      Shortness of breath dyspnea                                    Comment:at times due to asthma   Depression                                                  Past Surgical History:   KNEE SURGERY                                    Bilateral 2003,2007      Comment:total Joint   HAND SURGERY                                    Bilateral                Comment:carpal tunnel   TUBAL LIGATION                                                LAPAROSCOPY FOR ECTOPIC PREGNANCY                             BUNIONECTOMY                                    Bilateral                Comment:feet   Finger fracture                                 Right                Comment:5th finger   back surgery may 2016  Comment:lumbar   BMI    Body Mass Index   43.92 kg/m 2      Reproductive/Obstetrics negative OB ROS                            Anesthesia Physical Anesthesia Plan  ASA: III  Anesthesia Plan: General ETT   Post-op Pain Management:    Induction:   Airway Management Planned:   Additional Equipment:   Intra-op Plan:   Post-operative Plan:   Informed Consent: I have reviewed the patients History and Physical, chart, labs and discussed the procedure including the risks, benefits and alternatives for the proposed anesthesia with the patient or authorized representative who has indicated his/her understanding and acceptance.   Dental Advisory Given  Plan Discussed with: Anesthesiologist, CRNA and Surgeon  Anesthesia Plan Comments:         Anesthesia Quick Evaluation

## 2015-06-30 NOTE — Anesthesia Postprocedure Evaluation (Signed)
  Anesthesia Post-op Note  Patient: Melanie Bowen  Procedure(s) Performed: Procedure(s): VIDEO BRONCHOSCOPY WITHOUT FLUORO (N/A)  Anesthesia type:General ETT  Patient location: PACU  Post pain: Pain level controlled  Post assessment: Post-op Vital signs reviewed, Patient's Cardiovascular Status Stable, Respiratory Function Stable, Patent Airway and No signs of Nausea or vomiting  Post vital signs: Reviewed and stable  Last Vitals:  Filed Vitals:   06/30/15 1509  BP: 131/86  Pulse: 89  Temp:   Resp: 20    Level of consciousness: awake, alert  and patient cooperative  Complications: No apparent anesthesia complications

## 2015-06-30 NOTE — Transfer of Care (Signed)
Immediate Anesthesia Transfer of Care Note  Patient: Melanie Bowen  Procedure(s) Performed: Procedure(s): VIDEO BRONCHOSCOPY WITHOUT FLUORO (N/A)  Patient Location: PACU  Anesthesia Type:General  Level of Consciousness: awake and oriented  Airway & Oxygen Therapy: Patient Spontanous Breathing and Patient connected to face mask oxygen  Post-op Assessment: Report given to RN and Post -op Vital signs reviewed and stable  Post vital signs: Reviewed and stable  Last Vitals:  Filed Vitals:   06/30/15 1350  BP: 108/94  Pulse: 99  Temp: 36.4 C  Resp: 16    Complications: No apparent anesthesia complications

## 2015-06-30 NOTE — Op Note (Signed)
[facility] Patient Name: Melanie Bowen Procedure Date: 06/30/2015 1:17 PM MRN: [id number] Account #: [id number] Date of Birth: 06-Dec-1960 Admit Type: Outpatient Age: 54 Room: [facility] PROCEDURE RM 01 Gender: Female Note Status: Finalized Attending MD: Vilinda Boehringer,  Procedure:         Bronchoscopy Indications:       Chronic cough with normal chest X-ray Providers:         Vilinda Boehringer, Andrey Cota (Technician), Annia Belt,                     Admin. Environmental education officer) Referring MD:       Medicines:         General Anesthesia Complications:     No immediate complications Procedure:         Pre-Anesthesia Assessment:                    - A History and Physical has been performed. Patient meds                     and allergies have been reviewed. [Patient's Condition].                     The risks and benefits of the procedure and the sedation                     options and risks were discussed with [Consent Obtained                     From]. All questions were answered and informed consent                     was obtained. Patient identification and proposed                     procedure were verified prior to the procedure [Verifying                     Personnel] [Verification]. [Mental Status Exam]. [Airway                     Exam]. [Respiratory Exam]. [CV Exam]. [ASA Grade]. After                     reviewing the risks and benefits, the patient was deemed                     in satisfactory condition to undergo the procedure. The                     anesthesia plan was to use [Anesthesia Plan]. Immediately                     prior to administration of medications, the patient was                     re-assessed for adequacy to receive sedatives. The heart                     rate, respiratory rate, oxygen saturations, blood                     pressure, adequacy of pulmonary ventilation, and response  to care were monitored throughout the  procedure. The                     physical status of the patient was re-assessed after the                     procedure.                    After obtaining informed consent, the bronchoscope was                     passed under direct vision. Throughout the procedure, the                     patient's blood pressure, pulse, and oxygen saturations                     were monitored continuously. the Bronchoscope Olympus                     BF-Q180 S# [id number] was introduced through the and advanced                     to the trachea. The procedure was accomplished without                     difficulty. The patient tolerated the procedure well. The                     total duration of the procedure was 7 minutes. Findings:      The endotracheal tube is in good position. The visualized portion of the       trachea is of normal caliber. The carina is sharp. The tracheobronchial       tree was examined to at least the first subsegmental level. Bronchial       mucosa and anatomy are normal; there are no endobronchial lesions. Thin       frothy secretions noted throughtout the exam.      Bronchoalveolar lavage was performed in the right middle lobe of the       lung and sent for cell count, bacterial culture, viral smears & culture,       and fungal & AFB analysis and cytology. 40 mL of fluid were instilled.       21 mL were returned. The return was clear. There were no mucoid plugs in       the return fluid Impression:        - Chronic cough with normal chest X-ray                    - The examination was normal.                    - thin clear secretions noted throughout the examination                     of the airways, no endobronchial lesion.                    - Bronchoalveolar lavage was performed.                    - The examination was normal.                    -  Chronic cough with normal chest X-ray Recommendation:    - Await BAL results. Melanie Bowen,  06/30/2015 1:37:58  PM Number of Addenda: 0 Note Initiated On: 06/30/2015 1:17 PM      [facility]

## 2015-07-01 LAB — PNEUMOCYSTIS JIROVECI SMEAR BY DFA

## 2015-07-03 LAB — CULTURE, BAL-QUANTITATIVE

## 2015-07-03 LAB — CULTURE, BAL-QUANTITATIVE W GRAM STAIN

## 2015-07-03 LAB — MISC LABCORP TEST (SEND OUT): Labcorp test code: 180232

## 2015-07-08 ENCOUNTER — Ambulatory Visit: Payer: Medicare Other | Attending: Neurosurgery

## 2015-07-08 DIAGNOSIS — R531 Weakness: Secondary | ICD-10-CM | POA: Diagnosis present

## 2015-07-08 DIAGNOSIS — M5137 Other intervertebral disc degeneration, lumbosacral region: Secondary | ICD-10-CM | POA: Insufficient documentation

## 2015-07-08 DIAGNOSIS — M545 Low back pain: Secondary | ICD-10-CM | POA: Diagnosis present

## 2015-07-08 NOTE — Therapy (Signed)
[place] [facility] [address] [place], [address] Phone: [phone]   Fax:  [phone]  Physical Therapy Evaluation  Patient Details  Name: Melanie Bowen MRN: [id number] Date of Birth: 1961-06-06 Referring Provider: Earnie Larsson, MD  Encounter Date: 07/08/2015      PT End of Session - 07/08/15 0814    Visit Number 1   Number of Visits 9   Date for PT Re-Evaluation 08/07/15   Authorization Type 1   Authorization Time Period of 10   PT Start Time 0814   PT Stop Time 0915   PT Time Calculation (min) 61 min   Activity Tolerance Patient tolerated treatment well   Behavior During Therapy [facility] for tasks assessed/performed      Past Medical History  Diagnosis Date  . Asthma   . Cough   . Genital herpes   . HIV (human immunodeficiency virus infection) ([place])   . Bronchitis   . Arthritis   . GERD (gastroesophageal reflux disease)   . Eye irritation   . Paresthesia of hand, bilateral   . Blepharitis   . Allergic rhinitis   . Hypertension   . Cigarette smoker   . Headache   . Shortness of breath dyspnea     at times due to asthma  . Depression     Past Surgical History  Procedure Laterality Date  . Knee surgery Bilateral 2003,2007    total Joint  . Hand surgery Bilateral     carpal tunnel  . Tubal ligation    . Laparoscopy for ectopic pregnancy    . Bunionectomy Bilateral     feet  . Finger fracture Right     5th finger  . Back surgery may 2016      lumbar   . Video bronchoscopy N/A 06/30/2015    Procedure: VIDEO BRONCHOSCOPY WITHOUT FLUORO;  Surgeon: Vilinda Boehringer, MD;  Location: [facility] ORS;  Service: Cardiopulmonary;  Laterality: N/A;    There were no vitals filed for this visit.  Visit Diagnosis:  Other intervertebral disc degeneration, lumbosacral region - Plan: PT plan of care cert/re-cert  Bilateral low back pain, with sciatica presence unspecified - Plan: PT plan of care  cert/re-cert  Weakness - Plan: PT plan of care cert/re-cert      Subjective Assessment - 07/08/15 0821    Subjective Back pain 9-10/10 (when walking and trying to get into the car, getting up in the morning, stepping over the tub to get into it), 9.5/10 back pain currently (patient sitting currently), 8/10 at best (when patient takes her oxycontin, helps her move around a little bit more, using her rw)   Pertinent History Patient states that the morphine makes her sleepy but still waking her up with pain. Feels like oxycontin (but with lower dose) helps better with her pain. Pt states having back pain for while and had a difficult time sweeping the floor prior to her back surgey May 2016.  Pt states still having difficulty sweeping the floor after surgery. Has to sit at the side of her bed, relax herself, then takes her hand to push herself up to stand in the morning.  Pt feels like her surgeon did a good job with the  surgery. Feels like her muscles relax a lot when she sleeps at night and has to wake her muscles up in the morning in order for her to move.  Had a lumbar fusion in her lower back May 2016.  Pt states feeling low back pain which goes down her L posterior hip and proximal half of her posterior lateral thigh   Patient Stated Goals Trying to get off of her rw, walk long distances, get up from a chair without hurting as much. Learn exercises to help loosen up muscles in her back.    Currently in Pain? Yes   Pain Score 9   9.5/10   Pain Location Back   Pain Orientation Lower   Pain Type Chronic pain   Pain Onset More than a month ago   Aggravating Factors  walking 5 minutes, has to use a scooter when grocery shopping, standing greater than 15 minutes, sitting greater than 2-3 minutes (after sitting 2-3 minutes, pt has difficulty standing up)   Pain Relieving Factors oxycontin   Multiple Pain Sites Yes  low back, L thigh     Objectives:  Manual therapy  Prone P to A to mid  thoracic spine grade 3. Improved thoracic mobility afterwards.         [facility] PT Assessment - 07/08/15 0811    Assessment   Medical Diagnosis Other intervertebral disc degeneration, lumbosacral    Referring Provider Earnie Larsson, MD   Onset Date/Surgical Date 06/24/15   Next MD Visit September 12, 2015   Prior Therapy Had home health physical therapy following her surgery for 2 weeks   Precautions   Precaution Comments Back precautions   Restrictions   Other Position/Activity Restrictions No known restrictions   Balance Screen   Has the patient fallen in the past 6 months No   Has the patient had a decrease in activity level because of a fear of falling?  No   Is the patient reluctant to leave their home because of a fear of falling?  No   Prior Function   Vocation On disability   Vocation Requirements PLOF: difficulty sweeping, standing up from a chair, getting out of bed, walking   Observation/Other Assessments   Modified Oswertry 92%   Posture/Postural Control   Posture Comments bilaterally protracted shoulders and neck, increased lordosis at L2/3 joint (morvement preference to that area), decreased bilateral hip extension, bilateral foot pronation   AROM   Lumbar Flexion WFL but needed assist getting back to neutral   reproduced symtoms low back and L LE   Lumbar Extension limited with central low back pain   Lumbar - Right Side Bend limited with low back pain   Lumbar - Left Side Bend limited with low back pain   Lumbar - Right Rotation WFL   Lumbar - Left Rotation limited with low back pain   Strength   Right Hip Flexion 4/5   Right Hip Extension 3/5   Right Hip ABduction 4-/5  with lateral thigh pain   Left Hip Flexion 4-/5   Left Hip Extension 3/5   Left Hip ABduction 3+/5   Palpation   Palpation comment TTP low back at L2/L3 joint                           PT Education - 07/08/15 2008    Education provided Yes   Education Details plan of care    Person(s) Educated Patient;Spouse  fiancee present   Methods Explanation   Comprehension Verbalized understanding;Returned demonstration             PT Long Term Goals - 07/08/15 2011    PT LONG TERM GOAL #1   Title Patient  will have a decrease in back pain to 7/10 or less at worst to promote ability to get out of bed, get into a car, step into a tub   Time 4   Period Weeks   Status New   PT LONG TERM GOAL #2   Title Patient will improve her Modified Oswestry Low Back Pain Disability Questionnaire by at least 6 points as a demonstration of improved function.    Baseline 92%   Time 4   Period Weeks   Status New   PT LONG TERM GOAL #3   Title Patient will improve bilateral glute med and max strength by 1/2 MMT to promote ability to walk, perform transfers with less back pain.    Time 4   Period Weeks   Status New               Plan - 12-Jul-2015 1258    Clinical Impression Statement Patient is a 54 year old female who came to physical therapy secondary to low back pain with radiating symptoms. She aslo presents with reproduction of symptoms with lumbar flexion, bilateral hip weakness, movement preference to L2/L3 area with tenderness to palpation, decreased posterior to anterior mobility to thoracic spine, decreased bilateral hip extension, and difficulty with movements such as standing up from a chair, and getting out of bed. Patient will benefit from skilled physical therap services to address the aforementioned deficits.    Pt will benefit from skilled therapeutic intervention in order to improve on the following deficits Decreased strength;Hypomobility;Pain;Difficulty walking   Rehab Potential Fair   Clinical Impairments Affecting Rehab Potential pain, weakness   PT Frequency 2x / week   PT Duration 4 weeks   PT Treatment/Interventions Therapeutic exercise;Manual techniques;Therapeutic activities;Electrical Stimulation;Neuromuscular re-education;Ultrasound;Patient/family  education   PT Next Visit Plan posture, thoracic extension, hip strengthening, hip extension, gentle lumbar extension   Consulted and Agree with Plan of Care Patient          G-Codes - 12-Jul-2015 24-Nov-2016    Functional Assessment Tool Used Modified Oswestry Low Back Pain Disability Questionnaire, patient interview, clinical presentation   Functional Limitation Mobility: Walking and moving around   Mobility: Walking and Moving Around Current Status [phone]) At least 60 percent but less than 80 percent impaired, limited or restricted   Mobility: Walking and Moving Around Goal Status [phone]) At least 40 percent but less than 60 percent impaired, limited or restricted       Problem List Patient Active Problem List   Diagnosis Date Noted  . Chronic cough   . Cough 06/19/2015  . DDD (degenerative disc disease), lumbar 12/31/2014  . Degenerative disc disease, lumbar 12/31/2014  . Asthma, chronic 11/12/2014   Thank you for your referral.   Joneen Boers PT, DPT   2015-07-12, 8:27 PM  [place] [facility] [address], [place], [address] Phone: [phone]   Fax:  [phone]  Name: Melanie Bowen MRN: [id number] Date of Birth: 06/14/1961

## 2015-07-10 ENCOUNTER — Ambulatory Visit: Payer: Medicare Other

## 2015-07-10 DIAGNOSIS — M5137 Other intervertebral disc degeneration, lumbosacral region: Secondary | ICD-10-CM

## 2015-07-10 DIAGNOSIS — M545 Low back pain: Secondary | ICD-10-CM

## 2015-07-10 DIAGNOSIS — R531 Weakness: Secondary | ICD-10-CM

## 2015-07-10 NOTE — Patient Instructions (Addendum)
ROM: Flexion - Wand (Supine)    You do not have to use wand or bar. Raise arms over head. Hold for 5 seconds. Repeat ___10_ times per set. Do _3___ sets per session. Do __1__ sessions per day.  http://orth.exer.us/929   Copyright  VHI. All rights reserved.    On your back with left hip and knee straight, and right knee bent, squeeze your rear-end muscles together and press the back of your left knee down towards your bed (to feel a stretch in the front of your left hip). Hold for 5 seconds. Repeat 10 times. Do 3 sets. You can also do this exercise for your right side.

## 2015-07-10 NOTE — Therapy (Signed)
[place] [facility] [address] [place], [address] Phone: [phone]   Fax:  [phone]  Physical Therapy Treatment  Patient Details  Name: Melanie Bowen MRN: [id number] Date of Birth: 1960-09-23 Referring Provider: Earnie Larsson, MD  Encounter Date: 07/10/2015      PT End of Session - 07/10/15 0921    Visit Number 2   Number of Visits 9   Date for PT Re-Evaluation 08/07/15   Authorization Type 2   Authorization Time Period of 10   PT Start Time 0921   PT Stop Time 1006   PT Time Calculation (min) 45 min   Activity Tolerance Patient tolerated treatment well   Behavior During Therapy [facility] for tasks assessed/performed      Past Medical History  Diagnosis Date  . Asthma   . Cough   . Genital herpes   . HIV (human immunodeficiency virus infection) ([place])   . Bronchitis   . Arthritis   . GERD (gastroesophageal reflux disease)   . Eye irritation   . Paresthesia of hand, bilateral   . Blepharitis   . Allergic rhinitis   . Hypertension   . Cigarette smoker   . Headache   . Shortness of breath dyspnea     at times due to asthma  . Depression     Past Surgical History  Procedure Laterality Date  . Knee surgery Bilateral 2003,2007    total Joint  . Hand surgery Bilateral     carpal tunnel  . Tubal ligation    . Laparoscopy for ectopic pregnancy    . Bunionectomy Bilateral     feet  . Finger fracture Right     5th finger  . Back surgery may 2016      lumbar   . Video bronchoscopy N/A 06/30/2015    Procedure: VIDEO BRONCHOSCOPY WITHOUT FLUORO;  Surgeon: Vilinda Boehringer, MD;  Location: [facility] ORS;  Service: Cardiopulmonary;  Laterality: N/A;    There were no vitals filed for this visit.  Visit Diagnosis:  Other intervertebral disc degeneration, lumbosacral region  Bilateral low back pain, with sciatica presence unspecified  Weakness      Subjective Assessment - 07/10/15 0925    Subjective  Pt states that the pain level has not changed yet since we only just started.    Pertinent History Patient states that the morphine makes her sleepy but still waking her up with pain. Feels like oxycontin (but with lower dose) helps better with her pain. Pt states having back pain for while and had a difficult time sweeping the floor prior to her back surgey May 2016.  Pt states still having difficulty sweeping the floor after surgery. Has to sit at the side of her bed, relax herself, then takes her hand to push herself up to stand in the morning.  Pt feels like her surgeon did a good job with the  surgery. Feels like her muscles relax a lot when she sleeps at night and has to wake her muscles up in the morning in order for her to move.  Had a lumbar fusion in her lower back May 2016.  Pt states feeling low back pain which goes down her L posterior hip and proximal half of her posterior lateral thigh   Patient Stated Goals Trying to get off of her rw, walk long distances, get up from a chair without hurting as much. Learn exercises to help loosen up muscles in her back.  Currently in Pain? Yes   Pain Score --  No pain level provided   Pain Onset More than a month ago   Multiple Pain Sites Yes  low back, L thigh       Objectives:  There-ex  Directed patient with seated bilateral scapular retraction 10x3 with 5 second holds Supine bilateral shoulder flexion 10x3 with 5 second holds Supine hip extension isometrics with leg straight (to promote glute max strengthening and hip flexor stretch) 10x3 with 5 seconds each LE, Supine L hip extension isometrics in hooklying position 10x3 with 5 second holds,  Seated L hip extension isometrics 10x3 with 5 second holds Standing bilateral shoulder extension with scapular retraction resisting red band 10x 5 second holds, then with yellow band 10x5 second holds Sitting on dyna disc: anterior/posterior pelvic tilts 10x3 each direction Standing on rocker  board: ankle DF/PF 2 min (to promote LE tissue mobility)   Improved exercise technique, movement at target joints, use of target muscles after mod verbal, visual, tactile cues.     Tolerated session well without aggravation of symptoms. Improved thoracic extension posture. Demonstrates limited hip extension ROM in standing.        [name] PT Assessment - 07/10/15 0929    Observation/Other Assessments   Observations (+) Long sit test suggesing anterior nutation of L innominate.                              PT Education - 07/10/15 0933    Education provided Yes   Education Details ther-ex   Person(s) Educated Patient   Methods Explanation;Demonstration;Tactile cues;Verbal cues   Comprehension Verbalized understanding;Returned demonstration             PT Long Term Goals - 07/08/15 2011    PT LONG TERM GOAL #1   Title Patient will have a decrease in back pain to 7/10 or less at worst to promote ability to get out of bed, get into a car, step into a tub   Time 4   Period Weeks   Status New   PT LONG TERM GOAL #2   Title Patient will improve her Modified Oswestry Low Back Pain Disability Questionnaire by at least 6 points as a demonstration of improved function.    Baseline 92%   Time 4   Period Weeks   Status New   PT LONG TERM GOAL #3   Title Patient will improve bilateral glute med and max strength by 1/2 MMT to promote ability to walk, perform transfers with less back pain.    Time 4   Period Weeks   Status New               Plan - 07/10/15 0933    Clinical Impression Statement Tolerated session well without aggravation of symptoms. Improved thoracic extension posture. Demonstrates limited hip extension ROM in standing.    Pt will benefit from skilled therapeutic intervention in order to improve on the following deficits Decreased strength;Hypomobility;Pain;Difficulty walking   Rehab Potential Fair   Clinical Impairments Affecting Rehab  Potential pain, weakness   PT Frequency 2x / week   PT Duration 4 weeks   PT Treatment/Interventions Therapeutic exercise;Manual techniques;Therapeutic activities;Electrical Stimulation;Neuromuscular re-education;Ultrasound;Patient/family education   PT Next Visit Plan posture, thoracic extension, hip strengthening, hip extension, gentle lumbar extension   Consulted and Agree with Plan of Care Patient        Problem List Patient Active Problem List  Diagnosis Date Noted  . Chronic cough   . Cough 06/19/2015  . DDD (degenerative disc disease), lumbar 12/31/2014  . Degenerative disc disease, lumbar 12/31/2014  . Asthma, chronic 11/12/2014    Joneen Boers PT, DPT   07/10/2015, 10:27 AM  [place] [facility] [address], [place], [address] Phone: [phone]   Fax:  [phone]  Name: Melanie Bowen MRN: [id number] Date of Birth: 09/07/1960

## 2015-07-16 ENCOUNTER — Ambulatory Visit: Payer: Medicare Other

## 2015-07-16 DIAGNOSIS — M5137 Other intervertebral disc degeneration, lumbosacral region: Secondary | ICD-10-CM

## 2015-07-16 DIAGNOSIS — R531 Weakness: Secondary | ICD-10-CM

## 2015-07-16 DIAGNOSIS — M545 Low back pain: Secondary | ICD-10-CM

## 2015-07-16 NOTE — Therapy (Signed)
[facility] [address] [place], [address] Phone: [phone]   Fax:  [phone]  Physical Therapy Treatment  Patient Details  Name: MALIEA HORGER MRN: [id number] Date of Birth: [birth year] Referring Provider: Earnie Larsson, MD  Encounter Date: 07/16/2015      PT End of Session - 07/16/15 1019    Visit Number 3   Number of Visits 9   Date for PT Re-Evaluation 08/07/15   Authorization Type 3   Authorization Time Period of 10   PT Start Time 1019   PT Stop Time 1049   PT Time Calculation (min) 30 min   Activity Tolerance Patient tolerated treatment well   Behavior During Therapy [facility] for tasks assessed/performed      Past Medical History  Diagnosis Date  . Asthma   . Cough   . Genital herpes   . HIV (human immunodeficiency virus infection) ([place])   . Bronchitis   . Arthritis   . GERD (gastroesophageal reflux disease)   . Eye irritation   . Paresthesia of hand, bilateral   . Blepharitis   . Allergic rhinitis   . Hypertension   . Cigarette smoker   . Headache   . Shortness of breath dyspnea     at times due to asthma  . Depression     Past Surgical History  Procedure Laterality Date  . Knee surgery Bilateral 2003,2007    total Joint  . Hand surgery Bilateral     carpal tunnel  . Tubal ligation    . Laparoscopy for ectopic pregnancy    . Bunionectomy Bilateral     feet  . Finger fracture Right     5th finger  . Back surgery may 2016      lumbar   . Video bronchoscopy N/A 06/30/2015    Procedure: VIDEO BRONCHOSCOPY WITHOUT FLUORO;  Surgeon: Vilinda Boehringer, MD;  Location: [facility] ORS;  Service: Cardiopulmonary;  Laterality: N/A;    There were no vitals filed for this visit.  Visit Diagnosis:  Other intervertebral disc degeneration, lumbosacral region  Bilateral low back pain, with sciatica presence unspecified  Weakness      Subjective Assessment - 07/16/15 1022    Subjective  Pt states back feels the same. Body is sore for 2 days after therapy. Pt states that she has an 11 am appointment and needs to be out by 10:45 am    Pertinent History Patient states that the morphine makes her sleepy but still waking her up with pain. Feels like oxycontin (but with lower dose) helps better with her pain. Pt states having back pain for while and had a difficult time sweeping the floor prior to her back surgey May 2016.  Pt states still having difficulty sweeping the floor after surgery. Has to sit at the side of her bed, relax herself, then takes her hand to push herself up to stand in the morning.  Pt feels like her surgeon did a good job with the  surgery. Feels like her muscles relax a lot when she sleeps at night and has to wake her muscles up in the morning in order for her to move.  Had a lumbar fusion in her lower back May 2016.  Pt states feeling low back pain which goes down her L posterior hip and proximal half of her posterior lateral thigh   Patient Stated Goals Trying to get off of her rw, walk long distances, get up from  a chair without hurting as much. Learn exercises to help loosen up muscles in her back.    Currently in Pain? Yes   Pain Score --  No pain level provided   Pain Onset More than a month ago   Multiple Pain Sites Yes     Objectives:  There-ex  Directed patient with seated bilateral scapular retraction 10x3 with 5 second holds Supine hip flexor stretch with leg straight 15 seconds x 5 each LE  Supine with abdominal contraction and bilateral shoulder extension against table: alternate heel slides working on lumbopelvic control 3x5 each LE Log rolling from supine to sit Reviewed HEP with pt (please see patient instructions). Pt demonstrated and verbalized understanding.  Improved exercise technique, movement at target joints, use of target muscles after mod verbal, visual, tactile cues.                            PT Education -  07/16/15 1057    Education provided Yes   Education Details ther-ex, HEP   Person(s) Educated Patient   Methods Explanation;Demonstration;Tactile cues;Verbal cues   Comprehension Verbalized understanding;Returned demonstration             PT Long Term Goals - 07/08/15 2011    PT LONG TERM GOAL #1   Title Patient will have a decrease in back pain to 7/10 or less at worst to promote ability to get out of bed, get into a car, step into a tub   Time 4   Period Weeks   Status New   PT LONG TERM GOAL #2   Title Patient will improve her Modified Oswestry Low Back Pain Disability Questionnaire by at least 6 points as a demonstration of improved function.    Baseline 92%   Time 4   Period Weeks   Status New   PT LONG TERM GOAL #3   Title Patient will improve bilateral glute med and max strength by 1/2 MMT to promote ability to walk, perform transfers with less back pain.    Time 4   Period Weeks   Status New               Plan - 07/16/15 1057    Clinical Impression Statement Good hip flexor stretch felt with supine stretch. No low back discomfort when pt slides her heel back towards her in supine with activation of abdominal muscles. Good thoracic posture observed. Continue working on hip extension ROM, glute max utilization, and abdominal strengthening to decrease lumbar extension compensation.    Pt will benefit from skilled therapeutic intervention in order to improve on the following deficits Decreased strength;Hypomobility;Pain;Difficulty walking   Rehab Potential Fair   Clinical Impairments Affecting Rehab Potential pain, weakness   PT Frequency 2x / week   PT Duration 4 weeks   PT Treatment/Interventions Therapeutic exercise;Manual techniques;Therapeutic activities;Electrical Stimulation;Neuromuscular re-education;Ultrasound;Patient/family education   PT Next Visit Plan posture, thoracic extension, hip strengthening, hip extension, gentle lumbar extension   Consulted  and Agree with Plan of Care Patient        Problem List Patient Active Problem List   Diagnosis Date Noted  . Chronic cough   . Cough 06/19/2015  . DDD (degenerative disc disease), lumbar 12/31/2014  . Degenerative disc disease, lumbar 12/31/2014  . Asthma, chronic 11/12/2014   Joneen Boers PT, DPT    07/16/2015, 11:26 AM  [facility] [address]  [place], [place], [address] Phone: [phone]   Fax:  [phone]  Name: ALEEYAH NIEHAUS MRN: [id number] Date of Birth: [birth year]

## 2015-07-16 NOTE — Patient Instructions (Signed)
Gave supine hip flexor stretch with leg straight on bed and other hip and knee in hooklying position 15 seconds x 5 each LE daily (handout provided) and supine heel slides with abdominal contraction and bilateral shoulder extension isometrics on bed 5x3 each LE daily to work on lumbopelvic control as part of her HEP. Pt demonstrated and verbalized understanding.

## 2015-07-22 ENCOUNTER — Telehealth: Payer: Self-pay | Admitting: Internal Medicine

## 2015-07-22 NOTE — Telephone Encounter (Signed)
Pt informed message will be sent to VM. Will await response.

## 2015-07-23 LAB — FUNGUS CULTURE W SMEAR

## 2015-07-23 NOTE — Telephone Encounter (Signed)
Currently all her cultures from the bronchoscopy are negative to date.   -VM

## 2015-07-25 NOTE — Telephone Encounter (Signed)
I spoke with patient about results and she verbalized understanding and had no questions 

## 2015-07-28 ENCOUNTER — Encounter: Payer: Self-pay | Admitting: Medical Oncology

## 2015-07-28 ENCOUNTER — Emergency Department: Payer: Medicare Other

## 2015-07-28 ENCOUNTER — Emergency Department
Admission: EM | Admit: 2015-07-28 | Discharge: 2015-07-28 | Disposition: A | Discharge status: Home / Self Care | Payer: Medicare Other | Attending: Emergency Medicine | Admitting: Emergency Medicine

## 2015-07-28 DIAGNOSIS — G8929 Other chronic pain: Secondary | ICD-10-CM | POA: Diagnosis not present

## 2015-07-28 DIAGNOSIS — F17210 Nicotine dependence, cigarettes, uncomplicated: Secondary | ICD-10-CM | POA: Diagnosis not present

## 2015-07-28 DIAGNOSIS — M549 Dorsalgia, unspecified: Secondary | ICD-10-CM | POA: Insufficient documentation

## 2015-07-28 DIAGNOSIS — J45909 Unspecified asthma, uncomplicated: Secondary | ICD-10-CM | POA: Diagnosis not present

## 2015-07-28 DIAGNOSIS — I10 Essential (primary) hypertension: Secondary | ICD-10-CM | POA: Diagnosis not present

## 2015-07-28 DIAGNOSIS — J3489 Other specified disorders of nose and nasal sinuses: Secondary | ICD-10-CM

## 2015-07-28 DIAGNOSIS — Z7951 Long term (current) use of inhaled steroids: Secondary | ICD-10-CM | POA: Insufficient documentation

## 2015-07-28 DIAGNOSIS — Z79899 Other long term (current) drug therapy: Secondary | ICD-10-CM | POA: Insufficient documentation

## 2015-07-28 DIAGNOSIS — R0981 Nasal congestion: Secondary | ICD-10-CM | POA: Diagnosis present

## 2015-07-28 DIAGNOSIS — J069 Acute upper respiratory infection, unspecified: Secondary | ICD-10-CM | POA: Diagnosis not present

## 2015-07-28 MED ORDER — AZITHROMYCIN 250 MG PO TABS: ORAL_TABLET | ORAL | Status: DC

## 2015-07-28 MED ORDER — OXYCODONE-ACETAMINOPHEN 5-325 MG PO TABS
ORAL_TABLET | ORAL | Status: AC
  Administered 2015-07-28: 1 via ORAL
  Filled 2015-07-28: qty 1

## 2015-07-28 MED ORDER — OXYCODONE-ACETAMINOPHEN 5-325 MG PO TABS: 1.0000 | ORAL_TABLET | Freq: Once | ORAL | Status: DC

## 2015-07-28 MED ORDER — BENZONATATE 100 MG PO CAPS: ORAL_CAPSULE | ORAL | Status: DC

## 2015-07-28 MED ORDER — IPRATROPIUM-ALBUTEROL 0.5-2.5 (3) MG/3ML IN SOLN
3.0000 mL | Freq: Once | RESPIRATORY_TRACT | Status: AC
  Administered 2015-07-28: 3 mL via RESPIRATORY_TRACT
  Filled 2015-07-28: qty 3

## 2015-07-28 MED ORDER — OXYCODONE-ACETAMINOPHEN 5-325 MG PO TABS: 1.0000 | ORAL_TABLET | ORAL | Status: DC | PRN

## 2015-07-28 MED ORDER — OXYCODONE-ACETAMINOPHEN 5-325 MG PO TABS
1.0000 | ORAL_TABLET | Freq: Once | ORAL | Status: AC
  Administered 2015-07-28: 1 via ORAL

## 2015-07-28 NOTE — ED Provider Notes (Signed)
[facility] Emergency Department Provider Note  ____________________________________________  Time seen: Approximately 7:28 AM  I have reviewed the triage vital signs and the nursing notes.   HISTORY  Chief Complaint Nasal Congestion and Cough  HPI Melanie Bowen is a 54 y.o. female chief complaint of sinus pressure and congestion 3 days. Patient states that she has an occasional cough that is nonproductive. She denies any fever or chills. She denies any sore throat or ear pain. She has not been taking any over-the-counter medication for this.  She is also here today with complaint of back pain. Patient states she had surgery in May and is out of her Percocet. She states that her doctor at [facility] is out of town and she is unable to get her prescription. She states that she was given a prescription for morphine which caused itching and nausea. She states that the doctor told her take Benadryl which his not helped and she has discontinued the use of morphine completely. There is no changes in her back pain. There is no bowel or bladder incontinence.Really she states her pain is 9 out of 10.   Past Medical History  Diagnosis Date  . Asthma   . Cough   . Genital herpes   . HIV (human immunodeficiency virus infection) ([place])   . Bronchitis   . Arthritis   . GERD (gastroesophageal reflux disease)   . Eye irritation   . Paresthesia of hand, bilateral   . Blepharitis   . Allergic rhinitis   . Hypertension   . Cigarette smoker   . Headache   . Shortness of breath dyspnea     at times due to asthma  . Depression     Patient Active Problem List   Diagnosis Date Noted  . Chronic cough   . Cough 06/19/2015  . DDD (degenerative disc disease), lumbar 12/31/2014  . Degenerative disc disease, lumbar 12/31/2014  . Asthma, chronic 11/12/2014    Past Surgical History  Procedure Laterality Date  . Knee surgery Bilateral 2003,2007    total Joint  . Hand  surgery Bilateral     carpal tunnel  . Tubal ligation    . Laparoscopy for ectopic pregnancy    . Bunionectomy Bilateral     feet  . Finger fracture Right     5th finger  . Back surgery may 2016      lumbar   . Video bronchoscopy N/A 06/30/2015    Procedure: VIDEO BRONCHOSCOPY WITHOUT FLUORO;  Surgeon: Vilinda Boehringer, MD;  Location: [facility] ORS;  Service: Cardiopulmonary;  Laterality: N/A;    Current Outpatient Rx  Name  Route  Sig  Dispense  Refill  . albuterol (PROVENTIL HFA;VENTOLIN HFA) 108 (90 BASE) MCG/ACT inhaler   Inhalation   Inhale 90 mcg into the lungs every 6 (six) hours as needed for wheezing or shortness of breath.          Marland Kitchen amLODipine (NORVASC) 10 MG tablet   Oral   Take 10 mg by mouth daily.         Marland Kitchen azithromycin (ZITHROMAX Z-PAK) 250 MG tablet      Take 2 tablets (500 mg) on  Day 1,  followed by 1 tablet (250 mg) once daily on Days 2 through 5.   6 each   0   . benzonatate (TESSALON PERLES) 100 MG capsule      Take 1-2 capsules every 8 hours for cough   30 capsule   0   .  cyclobenzaprine (FLEXERIL) 10 MG tablet   Oral   Take 1 tablet (10 mg total) by mouth every 8 (eight) hours as needed for muscle spasms.   20 tablet   1   . diazepam (VALIUM) 5 MG tablet   Oral   Take 1 tablet (5 mg total) by mouth every 8 (eight) hours as needed for anxiety.   30 tablet   0   . efavirenz-emtricitabine-tenofovir (ATRIPLA) 600-200-300 MG per tablet   Oral   Take 1 tablet by mouth at bedtime.         . fluticasone (FLONASE) 50 MCG/ACT nasal spray   Nasal   Place 50 g into the nose daily as needed for allergies or rhinitis.          . Fluticasone-Salmeterol (ADVAIR) 500-50 MCG/DOSE AEPB   Inhalation   Inhale 500 mcg into the lungs 2 (two) times daily.          . hydrochlorothiazide (HYDRODIURIL) 25 MG tablet   Oral   Take 25 mg by mouth daily.         Marland Kitchen HYDROcodone-acetaminophen (NORCO) 10-325 MG per tablet   Oral   Take 1 tablet by mouth  every 12 (twelve) hours as needed.      0   . ibuprofen (ADVIL,MOTRIN) 800 MG tablet   Oral   Take 1 tablet (800 mg total) by mouth every 8 (eight) hours as needed.   30 tablet   0   . ipratropium-albuterol (DUONEB) 0.5-2.5 (3) MG/3ML SOLN   Inhalation   Inhale 3 mLs into the lungs 3 (three) times daily as needed (shortness of breath, wheezing).          Marland Kitchen losartan (COZAAR) 50 MG tablet   Oral   Take 50 mg by mouth daily.         . nicotine (NICOTROL) 10 MG inhaler   Inhalation   Inhale 1 cartridge (1 continuous puffing total) into the lungs as needed for smoking cessation.   42 each   0   . omeprazole (PRILOSEC) 40 MG capsule   Oral   Take 40 mg by mouth daily.         Marland Kitchen oxyCODONE-acetaminophen (PERCOCET) 5-325 MG tablet   Oral   Take 1 tablet by mouth every 4 (four) hours as needed for severe pain.   12 tablet   0   . promethazine (PHENERGAN) 25 MG tablet   Oral   Take 25 mg by mouth as needed for nausea or vomiting.          . valACYclovir (VALTREX) 1000 MG tablet   Oral   Take 1,000 mg by mouth daily as needed (outbreak).            Allergies Morphine and related; Gabapentin; and Zanaflex   Family History  Problem Relation Age of Onset  . Breast cancer Sister     Social History Social History  Substance Use Topics  . Smoking status: Light Tobacco Smoker -- 0.20 packs/day for 38 years    Types: Cigarettes  . Smokeless tobacco: Never Used  . Alcohol Use: 3.0 oz/week    5 Glasses of wine, 0 Standard drinks or equivalent per week    Review of Systems Constitutional: No fever/positive chills Eyes: No visual changes. ENT: Positive sore throat. Cardiovascular: Denies chest pain. Respiratory: Denies shortness of breath. Positive coughing Gastrointestinal: No abdominal pain.  No nausea, no vomiting.  No diarrhea. Genitourinary: Negative for dysuria. Musculoskeletal: Positive for chronic back  pain Skin: Negative for rash. Neurological:  Negative for headaches, focal weakness or numbness.  10-point ROS otherwise negative.  ____________________________________________   PHYSICAL EXAM:  VITAL SIGNS: ED Triage Vitals  Enc Vitals Group     BP 07/28/15 0723 122/68 mmHg     Pulse Rate 07/28/15 0721 98     Resp 07/28/15 0721 19     Temp 07/28/15 0721 97.3 F (36.3 C)     Temp Source 07/28/15 0721 Oral     SpO2 07/28/15 0721 98 %     Weight 07/28/15 0721 278 lb (126.1 kg)     Height 07/28/15 0721 5\' 6"  (1.676 m)     Head Cir --      Peak Flow --      Pain Score 07/28/15 0722 9     Pain Loc --      Pain Edu? --      Excl. in [place]? --     Constitutional: Alert and oriented. Well appearing and in no acute distress. Eyes: Conjunctivae are normal. PERRL. EOMI. Head: Atraumatic. Nose: Moderate congestion/rhinnorhea. There is some minimal facial tenderness on palpation of the frontal and maxillary sinuses. TMs are dull bilaterally Mouth/Throat: Mucous membranes are moist.  Oropharynx non-erythematous. Posterior drainage. Neck: No stridor.  Supple Hematological/Lymphatic/Immunilogical: No cervical lymphadenopathy. Cardiovascular: Normal rate, regular rhythm. Grossly normal heart sounds.  Good peripheral circulation. Respiratory: Normal respiratory effort.  No retractions. Lungs CTAB. Gastrointestinal: Soft and nontender. No distention. Bowel sounds are present 4 quadrants. Musculoskeletal: Moves all extremities without any difficulty. There was tenderness on palpation of the lower back. Range of motion with minimal difficulty. Patient is ambulatory in the emergency room. Generalized tenderness of the spine and soft tissue. Neurologic:  Normal speech and language. No gross focal neurologic deficits are appreciated. No gait instability. Skin:  Skin is warm, dry and intact. No rash noted. Psychiatric: Mood and affect are normal. Speech and behavior are normal.  ____________________________________________   LABS (all labs  ordered are listed, but only abnormal results are displayed)  Labs Reviewed - No data to display  PROCEDURES  Procedure(s) performed: None  Critical Care performed: No  ____________________________________________   INITIAL IMPRESSION / ASSESSMENT AND PLAN / ED COURSE  Pertinent labs & imaging results that were available during my care of the patient were reviewed by me and considered in my medical decision making (see chart for details).  Patient is to call her doctor in [place] for any continued pain medication. Multiple attempts were made however office does not open up until 9 AM and answering service was unable to answer the question of her doctor being out of town. Patient was given a prescription for [facility] as needed for cough. Zithromax Z-Pak as directed and a prescription for Percocet 5/325 #12 tablets no refill. ____________________________________________   FINAL CLINICAL IMPRESSION(S) / ED DIAGNOSES  Final diagnoses:  Acute upper respiratory infection  Sinus pain  Chronic back pain      Johnn Hai, PA-C 07/28/15 1442  Earleen Newport, MD 07/28/15 [phone]

## 2015-07-28 NOTE — ED Notes (Signed)
Cough, congestion, chills, back aches that began Saturday.

## 2015-07-28 NOTE — ED Notes (Signed)
Also is having back pain  States she had back surgery in May and is out of percocet

## 2015-07-28 NOTE — ED Notes (Signed)
States she developed sinus pressure and congestion on sat. States she is having some diff breathing and occasional cough

## 2015-07-29 ENCOUNTER — Ambulatory Visit: Payer: Medicare Other

## 2015-07-31 ENCOUNTER — Ambulatory Visit: Payer: Medicare Other

## 2015-08-05 ENCOUNTER — Telehealth: Payer: Self-pay

## 2015-08-05 ENCOUNTER — Ambulatory Visit: Payer: Medicare Other | Attending: Neurosurgery

## 2015-08-05 NOTE — Telephone Encounter (Signed)
No show. Called patient who said that she's recovering from being sick but thinks she will be able to go to her next appointment Thursday 08/07/15.

## 2015-08-06 ENCOUNTER — Other Ambulatory Visit: Payer: Self-pay | Admitting: Neurosurgery

## 2015-08-06 DIAGNOSIS — M5137 Other intervertebral disc degeneration, lumbosacral region: Secondary | ICD-10-CM

## 2015-08-07 ENCOUNTER — Ambulatory Visit: Payer: Medicare Other

## 2015-08-12 ENCOUNTER — Ambulatory Visit: Payer: Medicare Other

## 2015-08-13 LAB — ACID FAST SMEAR+CULTURE W/RFLX (ARMC ONLY)
Acid Fast Culture: NEGATIVE
Acid Fast Smear: NEGATIVE

## 2015-08-14 ENCOUNTER — Ambulatory Visit: Payer: Medicare Other

## 2015-08-18 ENCOUNTER — Ambulatory Visit
Admission: RE | Admit: 2015-08-18 | Discharge: 2015-08-18 | Disposition: A | Discharge status: Home / Self Care | Payer: Medicare Other | Source: Ambulatory Visit | Attending: Neurosurgery | Admitting: Neurosurgery

## 2015-08-18 DIAGNOSIS — M545 Low back pain: Secondary | ICD-10-CM | POA: Diagnosis not present

## 2015-08-18 DIAGNOSIS — M5137 Other intervertebral disc degeneration, lumbosacral region: Secondary | ICD-10-CM | POA: Diagnosis present

## 2015-08-18 DIAGNOSIS — Z9889 Other specified postprocedural states: Secondary | ICD-10-CM | POA: Insufficient documentation

## 2015-08-19 ENCOUNTER — Ambulatory Visit: Payer: Medicare Other

## 2015-08-19 ENCOUNTER — Telehealth: Payer: Self-pay

## 2015-08-19 NOTE — Telephone Encounter (Signed)
No show. Called and left a message pertaining to today's session. Also informed pt of her previous no show and cancellations and that due to the cancellation and no show policy, and that some patients might need the time slots not being used, a return phone call is requested to update [place] on her current status. If we do not hear from her, we might have to cancel her future sessions.

## 2015-08-21 ENCOUNTER — Ambulatory Visit: Payer: Medicare Other

## 2015-08-26 ENCOUNTER — Ambulatory Visit: Payer: Medicare Other

## 2015-09-15 ENCOUNTER — Other Ambulatory Visit (HOSPITAL_COMMUNITY): Payer: Self-pay | Admitting: Neurosurgery

## 2015-09-18 ENCOUNTER — Encounter: Payer: Self-pay | Admitting: Internal Medicine

## 2015-09-18 ENCOUNTER — Ambulatory Visit (INDEPENDENT_AMBULATORY_CARE_PROVIDER_SITE_OTHER): Payer: Medicare Other | Admitting: Internal Medicine

## 2015-09-18 VITALS — BP 128/70 | HR 90 | Ht 66.0 in | Wt 274.0 lb

## 2015-09-18 DIAGNOSIS — R05 Cough: Secondary | ICD-10-CM

## 2015-09-18 DIAGNOSIS — J4540 Moderate persistent asthma, uncomplicated: Secondary | ICD-10-CM

## 2015-09-18 DIAGNOSIS — R059 Cough, unspecified: Secondary | ICD-10-CM

## 2015-09-18 NOTE — Patient Instructions (Signed)
Follow up with Dr. Stevenson Clinch in: 3 months - continue with current antibiotics - quit smoking - We will setup a follow up appointment for you with Dr. [name] at [facility].

## 2015-09-18 NOTE — Progress Notes (Signed)
[facility]      MRN# [id number] Melanie Bowen [birth year]   CC: Chief Complaint  Patient presents with  . Follow-up    pt. states breathing is up & down. occ. SOB. dry cough, denies wheezing or chest pain/tightness.      Brief History: 10/2013 -  She is a pleasant 55 year old female past medical history of asthma, HIV, in consultation today for recurrent bronchitis/cough/orders of breath. Patient states that she's had recurrent upper respiratory tract infections over the past 2 months, this accompanied with cough (brown to clear productive sputum). Recently she went to the ED for visit due to cough and shortness of breath, she also had a chest x-ray at that time which was very 21st 2016, patient is getting treatment at that time due to being in the waiting room to long and left before treatment was given. Over the past months she's been using albuterol 2 times a day and nebulizer 2-3 times a day. Patient states that she is currently on Advair but she only uses it 3-4 days per week, she denies any intubation for respiratory issues in the past. States that she has about 4 episodes a year of "bronchitis" requiring steroids. She currently smokes half pack to a quarter pack per day for the past 40 years, she quits intermittently for months at a time. In the past she has used cocaine but currently denies any current use. No pets at home, previously worked in a [employer].   Review of records (personally reviewed by Dr. Edwin Dada time spent reviewing 30 minutes) Office visit 10/21/2014-recently completed antibiotics and prednisone 5 day course still feeling chest pressure, shortness of breath, wheezing, using inhalers daily, using nebulizers twice a day, no fever, still smoking. Plan prednisone taper, nicotine patch, referral to pulmonary. Office visit 10/15/2014-presented with complaints of cough, rhinorrhea, sinus pressure, right ear pain, wheezing, body  aches and malaise, purulent sputum, cough, shortness of breath, still smoking. Plan Levaquin, steroid burst, DuoNeb's U4 to 6 hours while awake for the next few days, continue Advair. Office visit 09/11/2014-complaints of cough, recently completed antibiotics and prednisone, still with some shortness of breath and wheezing, however this is improved, sputum is decrease. Plan quit smoking, humidify prescription, DuoNeb solution, start Advair.  Plan: - pulmonary function testing and 6 minute walk test prior to follow up - albuterol inhaler - 2puff every 3-4 hours as needed for shortness of breath\wheezing\recurrent cough - continue Advair Diskus - 1 puff twice a day (rinse and gargle after each use). - If patient continues to have a upper respiratory tract infections, bronchitis, will need to consider bronchoscopy with BAL, this can be discussed at her follow-up visit   [place] 06/25/15: Patient presents today for a follow up visit of cough and asthma. Accompanied by nurse aid.  Still with cough (with mild sputum production - clear to thick white).  Smoking about 2 cigs per day. Using albuterol rescue inhaler 2 times per day, in addition to using the albuterol neb 2 times per day. States that other members in her house smoke also. Would like to quit smoking and asked for assistance.   Plan Plan: - CT chest with contrast and Bronch with BAL - albuterol inhaler - 2puff every 3-4 hours as needed for shortness of breath\wheezing\recurrent cough - continue Advair Diskus - 1 puff twice a day (rinse and gargle after each use). Events since last clinic visit: She presents today for follow-up visit of cough and asthma. Since last  visit she's had a bronchoscopy with BAL. Review of records showed that she was in the ED in November 2016 for cough and sinus congestion and back pain. In November, when she is evaluated the ED for sinus congestion and upper story tract infection, she was given a Z-Pak. She states  that she is still using albuterol every other day, and smoking about one cigarette daily. This current tobacco use is a reduction for her. She still endorses a nonproductive cough today but no worsening shortness of breath or dyspnea on exertion.   Medication:   Current Outpatient Rx  Name  Route  Sig  Dispense  Refill  . albuterol (PROVENTIL HFA;VENTOLIN HFA) 108 (90 BASE) MCG/ACT inhaler   Inhalation   Inhale 90 mcg into the lungs every 6 (six) hours as needed for wheezing or shortness of breath.          Marland Kitchen amLODipine (NORVASC) 10 MG tablet   Oral   Take 10 mg by mouth daily.         . cyclobenzaprine (FLEXERIL) 10 MG tablet   Oral   Take 1 tablet (10 mg total) by mouth every 8 (eight) hours as needed for muscle spasms.   20 tablet   1   . efavirenz-emtricitabine-tenofovir (ATRIPLA) 600-200-300 MG per tablet   Oral   Take 1 tablet by mouth at bedtime.         . fluticasone (FLONASE) 50 MCG/ACT nasal spray   Nasal   Place 50 g into the nose daily as needed for allergies or rhinitis.          . Fluticasone-Salmeterol (ADVAIR) 500-50 MCG/DOSE AEPB   Inhalation   Inhale 500 mcg into the lungs 2 (two) times daily.          . hydrochlorothiazide (HYDRODIURIL) 25 MG tablet   Oral   Take 25 mg by mouth daily.         Marland Kitchen HYDROcodone-acetaminophen (NORCO) 10-325 MG per tablet   Oral   Take 1 tablet by mouth every 12 (twelve) hours as needed.      0   . ibuprofen (ADVIL,MOTRIN) 800 MG tablet   Oral   Take 1 tablet (800 mg total) by mouth every 8 (eight) hours as needed.   30 tablet   0   . ipratropium-albuterol (DUONEB) 0.5-2.5 (3) MG/3ML SOLN   Inhalation   Inhale 3 mLs into the lungs 3 (three) times daily as needed (shortness of breath, wheezing).          Marland Kitchen losartan (COZAAR) 50 MG tablet   Oral   Take 50 mg by mouth daily.         . nicotine (NICOTROL) 10 MG inhaler   Inhalation   Inhale 1 cartridge (1 continuous puffing total) into the lungs as  needed for smoking cessation.   42 each   0   . omeprazole (PRILOSEC) 40 MG capsule   Oral   Take 40 mg by mouth daily.         Marland Kitchen oxyCODONE-acetaminophen (PERCOCET) 5-325 MG tablet   Oral   Take 1 tablet by mouth every 4 (four) hours as needed for severe pain.   12 tablet   0   . promethazine (PHENERGAN) 25 MG tablet   Oral   Take 25 mg by mouth as needed for nausea or vomiting.          . valACYclovir (VALTREX) 1000 MG tablet   Oral   Take 1,000 mg by  mouth daily as needed (outbreak).             Review of Systems  Constitutional: Negative for fever and chills.  Eyes: Negative for blurred vision and double vision.  Respiratory: Positive for cough.   Cardiovascular: Negative for chest pain and palpitations.  Gastrointestinal: Negative for heartburn.  Genitourinary: Negative for dysuria.  Skin: Negative for rash.  Neurological: Negative for headaches.      Allergies:  Morphine and related; Gabapentin; and Zanaflex   Physical Examination:  VS: BP 128/70 mmHg  Pulse 90  Ht 5\' 6"  (1.676 m)  Wt 274 lb (124.286 kg)  BMI 44.25 kg/m2  SpO2 100%  General Appearance: No distress  HEENT: PERRLA, no ptosis, no other lesions noticed Pulmonary:normal breath sounds., diaphragmatic excursion normal.No wheezing, No rales   Cardiovascular:  Normal S1,S2.  No m/r/g.     Abdomen:Exam: Benign, Soft, non-tender, No masses  Skin:   warm, no rashes, no ecchymosis  Extremities: normal, no cyanosis, clubbing, warm with normal capillary refill.    Radiology: (The following images and results were reviewed by Dr. Stevenson Clinch on 09/18/2015). CXR 07/28/15 CLINICAL DATA: Productive cough for 2 days. Smoking history  EXAM: CHEST 2 VIEW  COMPARISON: 02/01/2015  FINDINGS: Normal heart size and mediastinal contours. No acute infiltrate or edema. Ground-glass opacity seen on chest CT 06/27/2015 would not be visible radiographically. No effusion or pneumothorax.  No acute  osseous findings.  IMPRESSION: No evidence acute cardiopulmonary disease.   CT Chest 05/2015 CLINICAL DATA: Acute recurrent cough. HIV.  EXAM: CT CHEST WITH CONTRAST  TECHNIQUE: Multidetector CT imaging of the chest was performed during intravenous contrast administration.  CONTRAST: 63mL OMNIPAQUE IOHEXOL 300 MG/ML SOLN  COMPARISON: None.  FINDINGS: THORACIC INLET/BODY WALL:  No acute abnormality.  MEDIASTINUM:  Normal heart size. No pericardial effusion. No acute vascular abnormality. No adenopathy.  LUNG WINDOWS:  Patchy and fairly mild apical predominant ground-glass opacities. There is also subpleural reticulation in the apical upper lobes, chronic from 09/30/2011 spine CT. Lung volumes are normal. No cystic changes  UPPER ABDOMEN:  Descending diverticulosis of the colon. Notable aortic atherosclerosis for age.  OSSEOUS:  No acute fracture. No suspicious lytic or blastic lesions. 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  IMPRESSION: Mild biapical ground-glass opacity, favor atypical pneumonia, hypersensitivity pneumonitis, or respiratory bronchiolitis. PCP pneumonia can have this pattern, correlate with CD 4 status.  Labs: Bronch BAL Specimen Description BRONCHIAL WASHINGS   Special Requests Immunocompromised   Gram Stain FEW WBC SEEN  NO ORGANISMS SEEN       Culture RARE GROWTH SERRATIA LIQUEFACIENS   Report Status 07/03/2015 FINAL   Organism ID, Bacteria SERRATIA LIQUEFACIENS          Assessment and Plan: Asthma, chronic Currently her asthma is still not controlled, she is using albuterol every other day.  She has been educated on the proper use of albuterol.I believe that her continued smoking is triggering bronchospasms along with recurrent respiratory infections. Today I counseled the patient heavily on quit tobacco and current effects it can have on  her current respiratory status.  Also today, educated patient heavily on need and use of a rescue nebulizer and rescue inhaler along with his current indications and administrations. She does have a history of HIV, currently following with infectious disease, follows up with Dr. [name].  Her Bronch with BAL showed Serratia Liquieformas, rare bacteria, she will be referred back to Dr. [name] for  treatment and evaluation of this organism. I did order PJP studies with her BAL, but some for unknown reason the lab cancelled it.   Plan: - albuterol inhaler - 2puff every 3-4 hours as needed for shortness of breath\wheezing\recurrent cough - continue Advair Diskus - 1 puff twice a day (rinse and gargle after each use). - follow up with ID for rare organism on BAL given HIV (+) history.      Cough The standardized cough guidelines published in Chest by Lissa Morales in 2006 are still the best available and consist of a multiple step process (up to 12!) , not a single office visit,  and are intended  to address this problem logically,  with an alogrithm dependent on response to empiric treatment at  each progressive step  to determine a specific diagnosis with  minimal addtional testing needed. Therefore if adherence is an issue or can't be accurately verified,  it's very unlikely the standard evaluation and treatment will be successful here.    Furthermore, response to therapy (other than acute cough suppression, which should only be used short term with avoidance of narcotic containing cough syrups if possible), can be a gradual process for which the patient may perceive immediate benefit.  At this time I believe that her recurrent cough is due to continued tobacco use.   Plan - cont with Advair. - tobacco cessation        Updated Medication List Outpatient Encounter Prescriptions as of 09/18/2015  Medication Sig  . albuterol (PROVENTIL HFA;VENTOLIN HFA) 108 (90 BASE) MCG/ACT inhaler  Inhale 90 mcg into the lungs every 6 (six) hours as needed for wheezing or shortness of breath.   Marland Kitchen amLODipine (NORVASC) 10 MG tablet Take 10 mg by mouth daily.  . cyclobenzaprine (FLEXERIL) 10 MG tablet Take 1 tablet (10 mg total) by mouth every 8 (eight) hours as needed for muscle spasms.  Marland Kitchen efavirenz-emtricitabine-tenofovir (ATRIPLA) 600-200-300 MG per tablet Take 1 tablet by mouth at bedtime.  . fluticasone (FLONASE) 50 MCG/ACT nasal spray Place 50 g into the nose daily as needed for allergies or rhinitis.   . Fluticasone-Salmeterol (ADVAIR) 500-50 MCG/DOSE AEPB Inhale 500 mcg into the lungs 2 (two) times daily.   . hydrochlorothiazide (HYDRODIURIL) 25 MG tablet Take 25 mg by mouth daily.  Marland Kitchen HYDROcodone-acetaminophen (NORCO) 10-325 MG per tablet Take 1 tablet by mouth every 12 (twelve) hours as needed.  Marland Kitchen ibuprofen (ADVIL,MOTRIN) 800 MG tablet Take 1 tablet (800 mg total) by mouth every 8 (eight) hours as needed.  Marland Kitchen ipratropium-albuterol (DUONEB) 0.5-2.5 (3) MG/3ML SOLN Inhale 3 mLs into the lungs 3 (three) times daily as needed (shortness of breath, wheezing).   Marland Kitchen losartan (COZAAR) 50 MG tablet Take 50 mg by mouth daily.  . nicotine (NICOTROL) 10 MG inhaler Inhale 1 cartridge (1 continuous puffing total) into the lungs as needed for smoking cessation.  Marland Kitchen omeprazole (PRILOSEC) 40 MG capsule Take 40 mg by mouth daily.  Marland Kitchen oxyCODONE-acetaminophen (PERCOCET) 5-325 MG tablet Take 1 tablet by mouth every 4 (four) hours as needed for severe pain.  . promethazine (PHENERGAN) 25 MG tablet Take 25 mg by mouth as needed for nausea or vomiting.   . valACYclovir (VALTREX) 1000 MG tablet Take 1,000 mg by mouth daily as needed (outbreak).   . [DISCONTINUED] azithromycin (ZITHROMAX Z-PAK) 250 MG tablet Take 2 tablets (500 mg) on  Day 1,  followed by 1 tablet (250 mg) once daily on Days 2 through 5. (Patient not taking: Reported on  09/18/2015)  . [DISCONTINUED] benzonatate (TESSALON PERLES) 100 MG capsule Take  1-2 capsules every 8 hours for cough (Patient not taking: Reported on 09/18/2015)  . [DISCONTINUED] diazepam (VALIUM) 5 MG tablet Take 1 tablet (5 mg total) by mouth every 8 (eight) hours as needed for anxiety. (Patient not taking: Reported on 09/18/2015)   No facility-administered encounter medications on file as of 09/18/2015.    Orders for this visit: No orders of the defined types were placed in this encounter.    Thank  you for the visitation and for allowing  [facility] to assist in the care of your patient. Our recommendations are noted above.  Please contact us if we can be of further service.  Vilinda Boehringer, MD [facility] Office Number: [phone]

## 2015-09-18 NOTE — Assessment & Plan Note (Signed)
The standardized cough guidelines published in Chest by Lissa Morales in 2006 are still the best available and consist of a multiple step process (up to 12!) , not a single office visit,  and are intended  to address this problem logically,  with an alogrithm dependent on response to empiric treatment at  each progressive step  to determine a specific diagnosis with  minimal addtional testing needed. Therefore if adherence is an issue or can't be accurately verified,  it's very unlikely the standard evaluation and treatment will be successful here.    Furthermore, response to therapy (other than acute cough suppression, which should only be used short term with avoidance of narcotic containing cough syrups if possible), can be a gradual process for which the patient may perceive immediate benefit.  At this time I believe that her recurrent cough is due to continued tobacco use.   Plan - cont with Advair. - tobacco cessation

## 2015-09-18 NOTE — Assessment & Plan Note (Signed)
Currently her asthma is still not controlled, she is using albuterol every other day.  She has been educated on the proper use of albuterol.I believe that her continued smoking is triggering bronchospasms along with recurrent respiratory infections. Today I counseled the patient heavily on quit tobacco and current effects it can have on her current respiratory status.  Also today, educated patient heavily on need and use of a rescue nebulizer and rescue inhaler along with his current indications and administrations. She does have a history of HIV, currently following with infectious disease, follows up with Dr. [name].  Her Bronch with BAL showed Serratia Liquieformas, rare bacteria, she will be referred back to Dr. [name] for treatment and evaluation of this organism. I did order PJP studies with her BAL, but some for unknown reason the lab cancelled it.   Plan: - albuterol inhaler - 2puff every 3-4 hours as needed for shortness of breath\wheezing\recurrent cough - continue Advair Diskus - 1 puff twice a day (rinse and gargle after each use). - follow up with ID for rare organism on BAL given HIV (+) history.

## 2015-10-23 NOTE — Pre-Procedure Instructions (Signed)
Melanie Bowen  10/23/2015      [facility] [place], [place] - [place] [address] Phone: [phone] Fax: [phone]    Your procedure is scheduled on Friday, October 31, 2015  Report to [facility] Admitting at 6:00 A.M.  Call this number if you have problems the morning of surgery:  [phone]   Remember:  Do not eat food or drink liquids after midnight Thursday, October 30, 2015  Take these medicines the morning of surgery with A SIP OF WATER :amLODipine (NORVASC), omeprazole (PRILOSEC), Fluticasone-Salmeterol (ADVAIR), if needed:valACYclovir (VALTREX) for outbreak,  promethazine (PHENERGAN) for nausea or vomiting, oxyCODONE (ROXICODONE) for pain,  fluticasone (FLONASE)  nasal spray for allergies,  albuterol (PROVENTIL HFA;VENTOLIN HFA)  Inhaler for wheezing or shortness of breath ( Bring inhaler in with you on day of surgery)  Stop taking Aspirin, fish oil, vitamins and herbal medications. Do not take any NSAIDs ie: Ibuprofen, Advil, Naproxen, BC and Goody Powder or any medication containing Aspirin; stop Saturday, October 25, 2015  Do not wear jewelry, make-up or nail polish.  Do not wear lotions, powders, or perfumes.  You may not wear deodorant.  Do not shave 48 hours prior to surgery.   Do not bring valuables to the hospital.  [facility] is not responsible for any belongings or valuables.  Contacts, dentures or bridgework may not be worn into surgery.  Leave your suitcase in the car.  After surgery it may be brought to your room.  For patients admitted to the hospital, discharge time will be determined by your treatment team.  Patients discharged the day of surgery will not be allowed to drive home.   Name and phone number of your driver: Fiance  Special instructions:  [name] - Preparing for Surgery  Before surgery, you can play an important role.  Because skin is not sterile, your skin needs to be as  free of germs as possible.  You can reduce the number of germs on you skin by washing with CHG (chlorahexidine gluconate) soap before surgery.  CHG is an antiseptic cleaner which kills germs and bonds with the skin to continue killing germs even after washing.  Please DO NOT use if you have an allergy to CHG or antibacterial soaps.  If your skin becomes reddened/irritated stop using the CHG and inform your nurse when you arrive at Short Stay.  Do not shave (including legs and underarms) for at least 48 hours prior to the first CHG shower.  You may shave your face.  Please follow these instructions carefully:   1.  Shower with CHG Soap the night before surgery and the morning of Surgery.  2.  If you choose to wash your hair, wash your hair first as usual with your normal shampoo.  3.  After you shampoo, rinse your hair and body thoroughly to remove the Shampoo.  4.  Use CHG as you would any other liquid soap.  You can apply chg directly  to the skin and wash gently with scrungie or a clean washcloth.  5.  Apply the CHG Soap to your body ONLY FROM THE NECK DOWN.  Do not use on open wounds or open sores.  Avoid contact with your eyes, ears, mouth and genitals (private parts).  Wash genitals (private parts) with your normal soap.  6.  Wash thoroughly, paying special attention to the area where your surgery will be performed.  7.  Thoroughly  rinse your body with warm water from the neck down.  8.  DO NOT shower/wash with your normal soap after using and rinsing off the CHG Soap.  9.  Pat yourself dry with a clean towel.            10.  Wear clean pajamas.            11.  Place clean sheets on your bed the night of your first shower and do not sleep with pets.  Day of Surgery  Do not apply any lotions/deodorants the morning of surgery.  Please wear clean clothes to the hospital/surgery center.  Please read over the following fact sheets that you were given. Pain Booklet, Coughing and Deep  Breathing, Blood Transfusion Information, MRSA Information and Surgical Site Infection Prevention

## 2015-10-24 ENCOUNTER — Encounter (HOSPITAL_COMMUNITY)
Admission: RE | Admit: 2015-10-24 | Discharge: 2015-10-24 | Disposition: A | Discharge status: Home / Self Care | Payer: Medicare Other | Source: Ambulatory Visit | Attending: Neurosurgery | Admitting: Neurosurgery

## 2015-10-24 ENCOUNTER — Encounter (HOSPITAL_COMMUNITY): Payer: Self-pay

## 2015-10-24 DIAGNOSIS — Z0183 Encounter for blood typing: Secondary | ICD-10-CM | POA: Insufficient documentation

## 2015-10-24 DIAGNOSIS — Z01812 Encounter for preprocedural laboratory examination: Secondary | ICD-10-CM | POA: Insufficient documentation

## 2015-10-24 DIAGNOSIS — R9431 Abnormal electrocardiogram [ECG] [EKG]: Secondary | ICD-10-CM | POA: Diagnosis not present

## 2015-10-24 DIAGNOSIS — Z01818 Encounter for other preprocedural examination: Secondary | ICD-10-CM | POA: Diagnosis not present

## 2015-10-24 HISTORY — DX: Claustrophobia: F40.240

## 2015-10-24 LAB — CBC WITH DIFFERENTIAL/PLATELET
Basophils Absolute: 0.1 10*3/uL (ref 0.0–0.1)
Basophils Relative: 1 %
Eosinophils Absolute: 0.5 10*3/uL (ref 0.0–0.7)
Eosinophils Relative: 8 %
HCT: 40.4 % (ref 36.0–46.0)
Hemoglobin: 13.3 g/dL (ref 12.0–15.0)
Lymphocytes Relative: 36 %
Lymphs Abs: 2.2 10*3/uL (ref 0.7–4.0)
MCH: 30.6 pg (ref 26.0–34.0)
MCHC: 32.9 g/dL (ref 30.0–36.0)
MCV: 92.9 fL (ref 78.0–100.0)
Monocytes Absolute: 0.4 10*3/uL (ref 0.1–1.0)
Monocytes Relative: 6 %
Neutro Abs: 3 10*3/uL (ref 1.7–7.7)
Neutrophils Relative %: 49 %
Platelets: 402 10*3/uL — ABNORMAL HIGH (ref 150–400)
RBC: 4.35 MIL/uL (ref 3.87–5.11)
RDW: 15 % (ref 11.5–15.5)
WBC: 6.2 10*3/uL (ref 4.0–10.5)

## 2015-10-24 LAB — BASIC METABOLIC PANEL
Anion gap: 11 (ref 5–15)
BUN: 11 mg/dL (ref 6–20)
CO2: 24 mmol/L (ref 22–32)
Calcium: 8.9 mg/dL (ref 8.9–10.3)
Chloride: 105 mmol/L (ref 101–111)
Creatinine, Ser: 0.82 mg/dL (ref 0.44–1.00)
GFR calc Af Amer: >60 mL/min (ref >60)
GFR calc non Af Amer: >60 mL/min (ref >60)
Glucose, Bld: 108 mg/dL — ABNORMAL HIGH (ref 65–99)
Potassium: 4.2 mmol/L (ref 3.5–5.1)
Sodium: 140 mmol/L (ref 135–145)

## 2015-10-24 LAB — SURGICAL PCR SCREEN
MRSA, PCR: POSITIVE — AB
Staphylococcus aureus: POSITIVE — AB

## 2015-10-24 LAB — TYPE AND SCREEN
ABO/RH(D): O POS
Antibody Screen: NEGATIVE

## 2015-10-24 NOTE — Progress Notes (Signed)
Pt. Reports that she hasn't ever been referred to cardiology for anything, PCP- in [place] at [facility]. Pt. Coughing today, states its allergies & that its normal for her.  She still smoking one cig. Per day. She states the Nicotine inhaler  (Rx) was too strong, she is not using it . Pt. Denies any changes in her chest, she denies all chest concerns.

## 2015-10-30 MED ORDER — DEXAMETHASONE SODIUM PHOSPHATE 10 MG/ML IJ SOLN
10.0000 mg | INTRAMUSCULAR | Status: AC
  Administered 2015-10-31: 10 mg via INTRAVENOUS
  Filled 2015-10-30: qty 1

## 2015-10-30 MED ORDER — DEXTROSE 5 % IV SOLN
3.0000 g | INTRAVENOUS | Status: AC
  Administered 2015-10-31: 3 g via INTRAVENOUS
  Filled 2015-10-30 (×3): qty 3000

## 2015-10-31 ENCOUNTER — Encounter (HOSPITAL_COMMUNITY): Payer: Self-pay | Admitting: *Deleted

## 2015-10-31 ENCOUNTER — Inpatient Hospital Stay (HOSPITAL_COMMUNITY): Payer: Medicare Other

## 2015-10-31 ENCOUNTER — Inpatient Hospital Stay (HOSPITAL_COMMUNITY): Payer: Medicare Other | Admitting: Anesthesiology

## 2015-10-31 ENCOUNTER — Encounter (HOSPITAL_COMMUNITY)
Admission: AD | Disposition: A | Discharge status: Home Health | Payer: Self-pay | Source: Ambulatory Visit | Attending: Neurosurgery

## 2015-10-31 ENCOUNTER — Inpatient Hospital Stay (HOSPITAL_COMMUNITY)
Admission: AD | Admit: 2015-10-31 | Discharge: 2015-11-02 | DRG: 460 | Disposition: A | Discharge status: Home Health | Payer: Medicare Other | Source: Ambulatory Visit | Attending: Neurosurgery | Admitting: Neurosurgery

## 2015-10-31 DIAGNOSIS — M1990 Unspecified osteoarthritis, unspecified site: Secondary | ICD-10-CM | POA: Diagnosis present

## 2015-10-31 DIAGNOSIS — Z7951 Long term (current) use of inhaled steroids: Secondary | ICD-10-CM | POA: Diagnosis not present

## 2015-10-31 DIAGNOSIS — Z888 Allergy status to other drugs, medicaments and biological substances status: Secondary | ICD-10-CM | POA: Diagnosis not present

## 2015-10-31 DIAGNOSIS — Z419 Encounter for procedure for purposes other than remedying health state, unspecified: Secondary | ICD-10-CM

## 2015-10-31 DIAGNOSIS — M549 Dorsalgia, unspecified: Secondary | ICD-10-CM | POA: Diagnosis present

## 2015-10-31 DIAGNOSIS — Z96653 Presence of artificial knee joint, bilateral: Secondary | ICD-10-CM | POA: Diagnosis present

## 2015-10-31 DIAGNOSIS — M960 Pseudarthrosis after fusion or arthrodesis: Secondary | ICD-10-CM | POA: Diagnosis present

## 2015-10-31 DIAGNOSIS — Z79899 Other long term (current) drug therapy: Secondary | ICD-10-CM

## 2015-10-31 DIAGNOSIS — J45909 Unspecified asthma, uncomplicated: Secondary | ICD-10-CM | POA: Diagnosis present

## 2015-10-31 DIAGNOSIS — Z6841 Body Mass Index (BMI) 40.0 and over, adult: Secondary | ICD-10-CM

## 2015-10-31 DIAGNOSIS — I10 Essential (primary) hypertension: Secondary | ICD-10-CM | POA: Diagnosis present

## 2015-10-31 DIAGNOSIS — K219 Gastro-esophageal reflux disease without esophagitis: Secondary | ICD-10-CM | POA: Diagnosis present

## 2015-10-31 DIAGNOSIS — Z885 Allergy status to narcotic agent status: Secondary | ICD-10-CM

## 2015-10-31 DIAGNOSIS — Y838 Other surgical procedures as the cause of abnormal reaction of the patient, or of later complication, without mention of misadventure at the time of the procedure: Secondary | ICD-10-CM | POA: Diagnosis present

## 2015-10-31 DIAGNOSIS — Z79891 Long term (current) use of opiate analgesic: Secondary | ICD-10-CM | POA: Diagnosis not present

## 2015-10-31 DIAGNOSIS — F17210 Nicotine dependence, cigarettes, uncomplicated: Secondary | ICD-10-CM | POA: Diagnosis present

## 2015-10-31 DIAGNOSIS — F329 Major depressive disorder, single episode, unspecified: Secondary | ICD-10-CM | POA: Diagnosis present

## 2015-10-31 DIAGNOSIS — Z21 Asymptomatic human immunodeficiency virus [HIV] infection status: Secondary | ICD-10-CM | POA: Diagnosis present

## 2015-10-31 DIAGNOSIS — S32009K Unspecified fracture of unspecified lumbar vertebra, subsequent encounter for fracture with nonunion: Secondary | ICD-10-CM | POA: Diagnosis present

## 2015-10-31 DIAGNOSIS — T84038A Mechanical loosening of other internal prosthetic joint, initial encounter: Principal | ICD-10-CM | POA: Diagnosis present

## 2015-10-31 SURGERY — POSTERIOR LUMBAR FUSION 2 WITH HARDWARE REMOVAL
Anesthesia: General | Site: Spine Lumbar

## 2015-10-31 MED ORDER — WHITE PETROLATUM GEL
Status: AC
  Filled 2015-10-31: qty 1

## 2015-10-31 MED ORDER — DIAZEPAM 5 MG PO TABS: 5.0000 mg | ORAL_TABLET | Freq: Four times a day (QID) | ORAL | Status: DC | PRN

## 2015-10-31 MED ORDER — SUGAMMADEX SODIUM 500 MG/5ML IV SOLN
INTRAVENOUS | Status: AC
  Filled 2015-10-31: qty 5

## 2015-10-31 MED ORDER — ACETAMINOPHEN 325 MG PO TABS: 650.0000 mg | ORAL_TABLET | ORAL | Status: DC | PRN

## 2015-10-31 MED ORDER — OXYCODONE-ACETAMINOPHEN 5-325 MG PO TABS
ORAL_TABLET | ORAL | Status: AC
  Filled 2015-10-31: qty 2

## 2015-10-31 MED ORDER — SODIUM CHLORIDE 0.9 % IV SOLN
250.0000 mL | INTRAVENOUS | Status: DC
  Administered 2015-10-31: 250 mL via INTRAVENOUS

## 2015-10-31 MED ORDER — NEOSTIGMINE METHYLSULFATE 10 MG/10ML IV SOLN
INTRAVENOUS | Status: DC | PRN
  Administered 2015-10-31: 5 mg via INTRAVENOUS

## 2015-10-31 MED ORDER — AMLODIPINE BESYLATE 10 MG PO TABS
10.0000 mg | ORAL_TABLET | Freq: Every day | ORAL | Status: DC
  Administered 2015-11-01 – 2015-11-02 (×2): 10 mg via ORAL
  Filled 2015-10-31 (×2): qty 1

## 2015-10-31 MED ORDER — PROMETHAZINE HCL 25 MG/ML IJ SOLN: 6.2500 mg | INTRAMUSCULAR | Status: DC | PRN

## 2015-10-31 MED ORDER — NICOTINE 10 MG IN INHA: 1.0000 | RESPIRATORY_TRACT | Status: DC | PRN

## 2015-10-31 MED ORDER — LACTATED RINGERS IV SOLN
INTRAVENOUS | Status: DC | PRN
  Administered 2015-10-31 (×2): via INTRAVENOUS

## 2015-10-31 MED ORDER — THROMBIN 20000 UNITS EX SOLR
CUTANEOUS | Status: DC | PRN
  Administered 2015-10-31: 09:00:00 via TOPICAL

## 2015-10-31 MED ORDER — FLUTICASONE PROPIONATE 50 MCG/ACT NA SUSP
1.0000 | Freq: Every day | NASAL | Status: DC | PRN
  Filled 2015-10-31: qty 16

## 2015-10-31 MED ORDER — DIAZEPAM 5 MG PO TABS
ORAL_TABLET | ORAL | Status: AC
  Administered 2015-10-31: 5 mg via ORAL
  Filled 2015-10-31: qty 1

## 2015-10-31 MED ORDER — PROMETHAZINE HCL 25 MG PO TABS: 25.0000 mg | ORAL_TABLET | Freq: Four times a day (QID) | ORAL | Status: DC | PRN

## 2015-10-31 MED ORDER — HYDROMORPHONE HCL 1 MG/ML IJ SOLN
0.5000 mg | INTRAMUSCULAR | Status: DC | PRN
  Administered 2015-11-01 – 2015-11-02 (×2): 1 mg via INTRAVENOUS
  Filled 2015-10-31 (×2): qty 1

## 2015-10-31 MED ORDER — SODIUM CHLORIDE 0.9 % IR SOLN
Status: DC | PRN
  Administered 2015-10-31: 09:00:00

## 2015-10-31 MED ORDER — HYDROMORPHONE HCL 1 MG/ML IJ SOLN
INTRAMUSCULAR | Status: AC
  Administered 2015-10-31: 0.5 mg via INTRAVENOUS
  Filled 2015-10-31: qty 1

## 2015-10-31 MED ORDER — OXYCODONE HCL 15 MG PO TABS: 15.0000 mg | ORAL_TABLET | Freq: Four times a day (QID) | ORAL | Status: DC | PRN

## 2015-10-31 MED ORDER — ALBUTEROL SULFATE (2.5 MG/3ML) 0.083% IN NEBU: 2.5000 mg | INHALATION_SOLUTION | Freq: Four times a day (QID) | RESPIRATORY_TRACT | Status: DC | PRN

## 2015-10-31 MED ORDER — BUPIVACAINE HCL (PF) 0.25 % IJ SOLN
INTRAMUSCULAR | Status: DC | PRN
  Administered 2015-10-31: 20 mL

## 2015-10-31 MED ORDER — OXYCODONE HCL 5 MG PO TABS
15.0000 mg | ORAL_TABLET | Freq: Four times a day (QID) | ORAL | Status: DC | PRN
  Administered 2015-10-31 – 2015-11-02 (×6): 15 mg via ORAL
  Filled 2015-10-31 (×7): qty 3

## 2015-10-31 MED ORDER — LOSARTAN POTASSIUM 50 MG PO TABS
50.0000 mg | ORAL_TABLET | Freq: Every day | ORAL | Status: DC
  Administered 2015-11-01 – 2015-11-02 (×2): 50 mg via ORAL
  Filled 2015-10-31 (×2): qty 1

## 2015-10-31 MED ORDER — OXYCODONE-ACETAMINOPHEN 5-325 MG PO TABS
1.0000 | ORAL_TABLET | ORAL | Status: DC | PRN
  Administered 2015-10-31 – 2015-11-02 (×6): 2 via ORAL
  Filled 2015-10-31 (×4): qty 2

## 2015-10-31 MED ORDER — DIAZEPAM 5 MG PO TABS
ORAL_TABLET | ORAL | Status: AC
  Filled 2015-10-31: qty 1

## 2015-10-31 MED ORDER — VALACYCLOVIR HCL 500 MG PO TABS
1000.0000 mg | ORAL_TABLET | Freq: Every day | ORAL | Status: DC
  Administered 2015-10-31 – 2015-11-01 (×2): 1000 mg via ORAL
  Filled 2015-10-31 (×2): qty 2

## 2015-10-31 MED ORDER — MENTHOL 3 MG MT LOZG: 1.0000 | LOZENGE | OROMUCOSAL | Status: DC | PRN

## 2015-10-31 MED ORDER — SODIUM CHLORIDE 0.9% FLUSH: 3.0000 mL | Freq: Two times a day (BID) | INTRAVENOUS | Status: DC

## 2015-10-31 MED ORDER — HYDROMORPHONE HCL 1 MG/ML IJ SOLN
0.2500 mg | INTRAMUSCULAR | Status: DC | PRN
  Administered 2015-10-31 (×4): 0.5 mg via INTRAVENOUS

## 2015-10-31 MED ORDER — GLYCOPYRROLATE 0.2 MG/ML IJ SOLN
INTRAMUSCULAR | Status: DC | PRN
  Administered 2015-10-31: 0.6 mg via INTRAVENOUS

## 2015-10-31 MED ORDER — ALBUTEROL SULFATE HFA 108 (90 BASE) MCG/ACT IN AERS
INHALATION_SPRAY | RESPIRATORY_TRACT | Status: DC | PRN
  Administered 2015-10-31: 3 via RESPIRATORY_TRACT

## 2015-10-31 MED ORDER — ROCURONIUM BROMIDE 100 MG/10ML IV SOLN
INTRAVENOUS | Status: DC | PRN
  Administered 2015-10-31: 10 mg via INTRAVENOUS
  Administered 2015-10-31: 50 mg via INTRAVENOUS
  Administered 2015-10-31: 10 mg via INTRAVENOUS

## 2015-10-31 MED ORDER — ONDANSETRON HCL 4 MG/2ML IJ SOLN
INTRAMUSCULAR | Status: DC | PRN
  Administered 2015-10-31: 4 mg via INTRAVENOUS

## 2015-10-31 MED ORDER — HYDROCODONE-ACETAMINOPHEN 5-325 MG PO TABS: 1.0000 | ORAL_TABLET | ORAL | Status: DC | PRN

## 2015-10-31 MED ORDER — FENTANYL CITRATE (PF) 250 MCG/5ML IJ SOLN
INTRAMUSCULAR | Status: DC | PRN
  Administered 2015-10-31: 50 ug via INTRAVENOUS
  Administered 2015-10-31 (×4): 100 ug via INTRAVENOUS

## 2015-10-31 MED ORDER — PROPOFOL 10 MG/ML IV BOLUS
INTRAVENOUS | Status: AC
  Filled 2015-10-31: qty 40

## 2015-10-31 MED ORDER — DOCUSATE SODIUM 100 MG PO CAPS
100.0000 mg | ORAL_CAPSULE | Freq: Two times a day (BID) | ORAL | Status: DC
  Administered 2015-10-31 – 2015-11-02 (×4): 100 mg via ORAL
  Filled 2015-10-31 (×4): qty 1

## 2015-10-31 MED ORDER — SODIUM CHLORIDE 0.9% FLUSH: 3.0000 mL | INTRAVENOUS | Status: DC | PRN

## 2015-10-31 MED ORDER — MEPERIDINE HCL 25 MG/ML IJ SOLN: 6.2500 mg | INTRAMUSCULAR | Status: DC | PRN

## 2015-10-31 MED ORDER — PANTOPRAZOLE SODIUM 40 MG PO TBEC
40.0000 mg | DELAYED_RELEASE_TABLET | Freq: Every day | ORAL | Status: DC
  Administered 2015-11-01 – 2015-11-02 (×2): 40 mg via ORAL
  Filled 2015-10-31 (×2): qty 1

## 2015-10-31 MED ORDER — HYDROCHLOROTHIAZIDE 25 MG PO TABS
25.0000 mg | ORAL_TABLET | Freq: Every day | ORAL | Status: DC
  Administered 2015-11-01 – 2015-11-02 (×2): 25 mg via ORAL
  Filled 2015-10-31 (×2): qty 1

## 2015-10-31 MED ORDER — MIDAZOLAM HCL 5 MG/5ML IJ SOLN
INTRAMUSCULAR | Status: DC | PRN
  Administered 2015-10-31: 2 mg via INTRAVENOUS

## 2015-10-31 MED ORDER — 0.9 % SODIUM CHLORIDE (POUR BTL) OPTIME
TOPICAL | Status: DC | PRN
  Administered 2015-10-31: 1000 mL

## 2015-10-31 MED ORDER — FENTANYL CITRATE (PF) 250 MCG/5ML IJ SOLN
INTRAMUSCULAR | Status: AC
  Filled 2015-10-31: qty 5

## 2015-10-31 MED ORDER — IPRATROPIUM-ALBUTEROL 0.5-2.5 (3) MG/3ML IN SOLN: 3.0000 mL | Freq: Three times a day (TID) | RESPIRATORY_TRACT | Status: DC | PRN

## 2015-10-31 MED ORDER — VANCOMYCIN HCL 1000 MG IV SOLR
INTRAVENOUS | Status: DC | PRN
  Administered 2015-10-31: 1000 mg via TOPICAL

## 2015-10-31 MED ORDER — PHENOL 1.4 % MT LIQD: 1.0000 | OROMUCOSAL | Status: DC | PRN

## 2015-10-31 MED ORDER — LIDOCAINE HCL (CARDIAC) 20 MG/ML IV SOLN
INTRAVENOUS | Status: DC | PRN
  Administered 2015-10-31: 50 mg via INTRATRACHEAL

## 2015-10-31 MED ORDER — PROPOFOL 10 MG/ML IV BOLUS
INTRAVENOUS | Status: DC | PRN
  Administered 2015-10-31: 160 mg via INTRAVENOUS
  Administered 2015-10-31: 20 mg via INTRAVENOUS

## 2015-10-31 MED ORDER — ACETAMINOPHEN 650 MG RE SUPP
650.0000 mg | RECTAL | Status: DC | PRN
Start: 2015-10-31 — End: 2015-11-02

## 2015-10-31 MED ORDER — MIDAZOLAM HCL 2 MG/2ML IJ SOLN
INTRAMUSCULAR | Status: AC
  Filled 2015-10-31: qty 2

## 2015-10-31 MED ORDER — LACTATED RINGERS IV SOLN: INTRAVENOUS | Status: DC

## 2015-10-31 MED ORDER — MOMETASONE FURO-FORMOTEROL FUM 200-5 MCG/ACT IN AERO
2.0000 | INHALATION_SPRAY | Freq: Two times a day (BID) | RESPIRATORY_TRACT | Status: DC
  Administered 2015-10-31 – 2015-11-02 (×4): 2 via RESPIRATORY_TRACT
  Filled 2015-10-31: qty 8.8

## 2015-10-31 MED ORDER — HYDROMORPHONE HCL 1 MG/ML IJ SOLN
INTRAMUSCULAR | Status: DC
Start: 2015-10-31 — End: 2015-10-31
  Filled 2015-10-31: qty 1

## 2015-10-31 MED ORDER — DIAZEPAM 5 MG PO TABS
5.0000 mg | ORAL_TABLET | Freq: Four times a day (QID) | ORAL | Status: DC | PRN
  Administered 2015-10-31 (×3): 5 mg via ORAL
  Administered 2015-11-01 – 2015-11-02 (×3): 10 mg via ORAL
  Filled 2015-10-31: qty 2
  Filled 2015-10-31: qty 1
  Filled 2015-10-31 (×2): qty 2

## 2015-10-31 MED ORDER — ONDANSETRON HCL 4 MG/2ML IJ SOLN: 4.0000 mg | INTRAMUSCULAR | Status: DC | PRN

## 2015-10-31 MED ORDER — VANCOMYCIN HCL 1000 MG IV SOLR
INTRAVENOUS | Status: AC
  Filled 2015-10-31: qty 1000

## 2015-10-31 MED ORDER — OXYCODONE-ACETAMINOPHEN 5-325 MG PO TABS
ORAL_TABLET | ORAL | Status: AC
  Administered 2015-10-31: 2 via ORAL
  Filled 2015-10-31: qty 2

## 2015-10-31 MED ORDER — EFAVIRENZ-EMTRICITAB-TENOFOVIR 600-200-300 MG PO TABS
1.0000 | ORAL_TABLET | Freq: Every day | ORAL | Status: DC
  Administered 2015-10-31 – 2015-11-01 (×2): 1 via ORAL
  Filled 2015-10-31 (×2): qty 1

## 2015-10-31 MED ORDER — CEFAZOLIN SODIUM 1-5 GM-% IV SOLN
1.0000 g | Freq: Three times a day (TID) | INTRAVENOUS | Status: AC
  Administered 2015-10-31 – 2015-11-01 (×2): 1 g via INTRAVENOUS
  Filled 2015-10-31 (×2): qty 50

## 2015-10-31 SURGICAL SUPPLY — 59 items
APL SKNCLS STERI-STRIP NONHPOA (GAUZE/BANDAGES/DRESSINGS) ×1
BAG DECANTER FOR FLEXI CONT (MISCELLANEOUS) ×3 IMPLANT
BENZOIN TINCTURE PRP APPL 2/3 (GAUZE/BANDAGES/DRESSINGS) ×3 IMPLANT
BLADE CLIPPER SURG (BLADE) IMPLANT
BRUSH SCRUB EZ PLAIN DRY (MISCELLANEOUS) ×3 IMPLANT
BUR CUTTER 7.0 ROUND (BURR) IMPLANT
BUR MATCHSTICK NEURO 3.0 LAGG (BURR) ×3 IMPLANT
CANISTER SUCT 3000ML PPV (MISCELLANEOUS) ×3 IMPLANT
CLOSURE WOUND 1/2 X4 (GAUZE/BANDAGES/DRESSINGS) ×1
CONT SPEC 4OZ CLIKSEAL STRL BL (MISCELLANEOUS) ×3 IMPLANT
COVER BACK TABLE 60X90IN (DRAPES) ×3 IMPLANT
DECANTER SPIKE VIAL GLASS SM (MISCELLANEOUS) ×1 IMPLANT
DRAPE C-ARM 42X72 X-RAY (DRAPES) ×6 IMPLANT
DRAPE LAPAROTOMY 100X72X124 (DRAPES) ×3 IMPLANT
DRAPE POUCH INSTRU U-SHP 10X18 (DRAPES) ×3 IMPLANT
DRAPE PROXIMA HALF (DRAPES) IMPLANT
DRAPE SURG 17X23 STRL (DRAPES) ×12 IMPLANT
DRSG OPSITE POSTOP 4X6 (GAUZE/BANDAGES/DRESSINGS) ×2 IMPLANT
DURAPREP 26ML APPLICATOR (WOUND CARE) ×3 IMPLANT
ELECT REM PT RETURN 9FT ADLT (ELECTROSURGICAL) ×3
ELECTRODE REM PT RTRN 9FT ADLT (ELECTROSURGICAL) ×1 IMPLANT
EVACUATOR 1/8 PVC DRAIN (DRAIN) ×1 IMPLANT
GAUZE SPONGE 4X4 12PLY STRL (GAUZE/BANDAGES/DRESSINGS) ×3 IMPLANT
GAUZE SPONGE 4X4 16PLY XRAY LF (GAUZE/BANDAGES/DRESSINGS) IMPLANT
GLOVE BIOGEL PI IND STRL 7.0 (GLOVE) IMPLANT
GLOVE BIOGEL PI IND STRL 7.5 (GLOVE) IMPLANT
GLOVE BIOGEL PI INDICATOR 7.0 (GLOVE) ×6
GLOVE BIOGEL PI INDICATOR 7.5 (GLOVE) ×4
GLOVE ECLIPSE 6.5 STRL STRAW (GLOVE) ×2 IMPLANT
GLOVE ECLIPSE 9.0 STRL (GLOVE) ×6 IMPLANT
GLOVE SURG SS PI 7.0 STRL IVOR (GLOVE) ×6 IMPLANT
GOWN STRL REUS W/ TWL LRG LVL3 (GOWN DISPOSABLE) IMPLANT
GOWN STRL REUS W/ TWL XL LVL3 (GOWN DISPOSABLE) ×2 IMPLANT
GOWN STRL REUS W/TWL 2XL LVL3 (GOWN DISPOSABLE) IMPLANT
GOWN STRL REUS W/TWL LRG LVL3 (GOWN DISPOSABLE) ×3
GOWN STRL REUS W/TWL XL LVL3 (GOWN DISPOSABLE) ×12
GRAFT BN 10X1XDBM MAGNIFUSE (Bone Implant) IMPLANT
GRAFT BONE MAGNIFUSE 1X10CM (Bone Implant) ×3 IMPLANT
KIT BASIN OR (CUSTOM PROCEDURE TRAY) ×3 IMPLANT
KIT INFUSE X SMALL 1.4CC (Orthopedic Implant) ×2 IMPLANT
KIT ROOM TURNOVER OR (KITS) ×3 IMPLANT
LIQUID BAND (GAUZE/BANDAGES/DRESSINGS) ×3 IMPLANT
NEEDLE HYPO 22GX1.5 SAFETY (NEEDLE) ×3 IMPLANT
NS IRRIG 1000ML POUR BTL (IV SOLUTION) ×3 IMPLANT
PACK LAMINECTOMY NEURO (CUSTOM PROCEDURE TRAY) ×3 IMPLANT
SCREW LOCK (Screw) ×18 IMPLANT
SCREW LOCK FXNS SPNE MAS PL (Screw) IMPLANT
SCREW SHANK 6.5X65 (Screw) ×4 IMPLANT
SCREW TULIP 5.5 (Screw) ×4 IMPLANT
SPONGE SURGIFOAM ABS GEL 100 (HEMOSTASIS) ×3 IMPLANT
STRIP CLOSURE SKIN 1/2X4 (GAUZE/BANDAGES/DRESSINGS) ×3 IMPLANT
SUT VIC AB 0 CT1 18XCR BRD8 (SUTURE) ×2 IMPLANT
SUT VIC AB 0 CT1 8-18 (SUTURE) ×6
SUT VIC AB 2-0 CT1 18 (SUTURE) ×3 IMPLANT
SUT VIC AB 3-0 SH 8-18 (SUTURE) ×4 IMPLANT
TOWEL OR 17X24 6PK STRL BLUE (TOWEL DISPOSABLE) ×3 IMPLANT
TOWEL OR 17X26 10 PK STRL BLUE (TOWEL DISPOSABLE) ×3 IMPLANT
TRAY FOLEY W/METER SILVER 14FR (SET/KITS/TRAYS/PACK) ×3 IMPLANT
WATER STERILE IRR 1000ML POUR (IV SOLUTION) ×3 IMPLANT

## 2015-10-31 NOTE — Progress Notes (Signed)
Pt arrived to 5M20 via stretcher.  Pt ambulated from stretcher to bed with 2 assist.  [place] to the back clean dry and intact.  Pt alert and conversant, family at bedside.  Will continue to monitor. Cori Razor, RN

## 2015-10-31 NOTE — Transfer of Care (Signed)
Immediate Anesthesia Transfer of Care Note  Patient: Melanie Bowen  Procedure(s) Performed: Procedure(s): EXPLORATION OF FUSION LUMBAR THREE-FOUR, LUMBAR FOUR-FIVE WITH REMOVAL OF HARDWARE  (N/A)  Patient Location: PACU  Anesthesia Type:General  Level of Consciousness: awake  Airway & Oxygen Therapy: Patient Spontanous Breathing  Post-op Assessment: Report given to RN and Post -op Vital signs reviewed and stable  Post vital signs: Reviewed and stable  Last Vitals:  Filed Vitals:   10/31/15 0635  BP: 112/71  Pulse: 89  Temp: 0000000 C    Complications: No apparent anesthesia complications

## 2015-10-31 NOTE — Progress Notes (Signed)
Utilization review completed.  

## 2015-10-31 NOTE — Anesthesia Preprocedure Evaluation (Addendum)
Anesthesia Evaluation  Patient identified by MRN, date of birth, ID band Patient awake    Reviewed: Allergy & Precautions, NPO status , Patient's Chart, lab work & pertinent test results  Airway Mallampati: II  TM Distance: >3 FB Neck ROM: Full    Dental  (+) Teeth Intact, Dental Advisory Given   Pulmonary asthma , Current Smoker,     + wheezing      Cardiovascular hypertension, Pt. on medications  Rhythm:Regular Rate:Normal     Neuro/Psych  Headaches, PSYCHIATRIC DISORDERS Anxiety Depression    GI/Hepatic Neg liver ROS, GERD  Medicated,  Endo/Other  negative endocrine ROS  Renal/GU negative Renal ROS  negative genitourinary   Musculoskeletal  (+) Arthritis ,   Abdominal   Peds negative pediatric ROS (+)  Hematology negative hematology ROS (+)   Anesthesia Other Findings   Reproductive/Obstetrics negative OB ROS                            Lab Results  Component Value Date   WBC 6.2 10/24/2015   HGB 13.3 10/24/2015   HCT 40.4 10/24/2015   MCV 92.9 10/24/2015   PLT 402* 10/24/2015   Lab Results  Component Value Date   CREATININE 0.82 10/24/2015   BUN 11 10/24/2015   NA 140 10/24/2015   K 4.2 10/24/2015   CL 105 10/24/2015   CO2 24 10/24/2015   No results found for: INR, PROTIME  10/2015 EKG: normal sinus rhythm.   Anesthesia Physical Anesthesia Plan  ASA: III  Anesthesia Plan: General   Post-op Pain Management:    Induction: Intravenous  Airway Management Planned: Oral ETT  Additional Equipment:   Intra-op Plan:   Post-operative Plan: Extubation in OR  Informed Consent: I have reviewed the patients History and Physical, chart, labs and discussed the procedure including the risks, benefits and alternatives for the proposed anesthesia with the patient or authorized representative who has indicated his/her understanding and acceptance.   Dental advisory  given  Plan Discussed with: CRNA, Surgeon and Anesthesiologist  Anesthesia Plan Comments: (Will perform preop breathing treatment. )       Anesthesia Quick Evaluation

## 2015-10-31 NOTE — H&P (Signed)
Melanie Bowen is an 55 y.o. female.   Chief Complaint: Back pain HPI: 55 year old woman status post previous L3-4 and L4-5 decompression and fusion with instrumentation who has persistent back pain. Workup demonstrates evidence of incomplete union at L3-4 with loosening of her L3 pedicle screw hardware. Patient presents now for reexploration of her lumbar fusion with probable revision and augmentation of this fusion.  Past Medical History  Diagnosis Date  . Asthma   . Cough   . Genital herpes   . HIV (human immunodeficiency virus infection) ([place])   . Bronchitis   . GERD (gastroesophageal reflux disease)   . Eye irritation   . Paresthesia of hand, bilateral   . Blepharitis   . Allergic rhinitis   . Hypertension   . Cigarette smoker   . Headache   . Shortness of breath dyspnea     at times due to asthma  . Depression   . Arthritis     DDD  . Claustrophobia     Past Surgical History  Procedure Laterality Date  . Knee surgery Bilateral 2003,2007    total Joint  . Hand surgery Bilateral     carpal tunnel  . Tubal ligation    . Laparoscopy for ectopic pregnancy    . Bunionectomy Bilateral     feet  . Finger fracture Right     5th finger  . Back surgery may 2016      lumbar   . Video bronchoscopy N/A 06/30/2015    Procedure: VIDEO BRONCHOSCOPY WITHOUT FLUORO;  Surgeon: Vilinda Boehringer, MD;  Location: [facility] ORS;  Service: Cardiopulmonary;  Laterality: N/A;  . Diagnostic laparoscopy    . Back surgery    . Joint replacement Bilateral     knees    Family History  Problem Relation Age of Onset  . Breast cancer Sister    Social History:  reports that she has been smoking Cigarettes.  She has a 7.6 pack-year smoking history. She has never used smokeless tobacco. She reports that she drinks about 3.0 oz of alcohol per week. She reports that she does not use illicit drugs.  Allergies:  Allergies  Allergen Reactions  . Morphine And Related   . Gabapentin Rash  . Zanaflex   [Tizanidine] Rash    Medications Prior to Admission  Medication Sig Dispense Refill  . albuterol (PROVENTIL HFA;VENTOLIN HFA) 108 (90 BASE) MCG/ACT inhaler Inhale 90 mcg into the lungs every 6 (six) hours as needed for wheezing or shortness of breath.     Marland Kitchen amLODipine (NORVASC) 10 MG tablet Take 10 mg by mouth daily before breakfast.     . efavirenz-emtricitabine-tenofovir (ATRIPLA) 600-200-300 MG per tablet Take 1 tablet by mouth at bedtime.    . fluticasone (FLONASE) 50 MCG/ACT nasal spray Place 50 g into the nose daily as needed for allergies or rhinitis.     . Fluticasone-Salmeterol (ADVAIR) 500-50 MCG/DOSE AEPB Inhale 500 mcg into the lungs 2 (two) times daily.     . hydrochlorothiazide (HYDRODIURIL) 25 MG tablet Take 25 mg by mouth daily.    Marland Kitchen ipratropium-albuterol (DUONEB) 0.5-2.5 (3) MG/3ML SOLN Inhale 3 mLs into the lungs 3 (three) times daily as needed (shortness of breath, wheezing).     Marland Kitchen losartan (COZAAR) 50 MG tablet Take 50 mg by mouth daily.    Marland Kitchen omeprazole (PRILOSEC) 40 MG capsule Take 40 mg by mouth daily before breakfast.     . oxyCODONE (ROXICODONE) 15 MG immediate release tablet Take 15 mg by  mouth every 6 (six) hours as needed for pain.    . promethazine (PHENERGAN) 25 MG tablet Take 25 mg by mouth as needed for nausea or vomiting.     . valACYclovir (VALTREX) 1000 MG tablet Take 1,000 mg by mouth at bedtime.     . nicotine (NICOTROL) 10 MG inhaler Inhale 1 cartridge (1 continuous puffing total) into the lungs as needed for smoking cessation. 42 each 0    No results found for this or any previous visit (from the past 48 hour(s)). No results found.  Pertinent items noted in HPI and remainder of comprehensive ROS otherwise negative.  Blood pressure 112/71, pulse 89, temperature 97.1 F (36.2 C), temperature source Oral, height 5\' 6"  (1.676 m), weight 122.471 kg (270 lb), SpO2 98 %.  Awake and alert, oriented and appropriate. Cranial nerve function intact. Speech  fluent. Judgment and insight intact. Motor 5/5 bilateral upper and lower extremities. Reflexes normal. Back wound well-healed. Examination head ears eyes and thirds unremarkable. Chest and abdomen are benign. Extremities are free from injury deformity.   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 Plan reexploration of lumbar fusion with revision lumbar hardware and augmentation of fusion.    Melanie Bowen A 10/31/2015, 7:44 AM

## 2015-10-31 NOTE — Op Note (Signed)
Date of procedure: 10/31/2015  Date of dictation: 10/31/2015  Service: Neurosurgery  Preoperative diagnosis: L3-L4 pseudoarthrosis with failed hardware  Postoperative diagnosis: Same  Procedure Name: Reexploration of L3 L4 L5 fusion with removal of instrumentation.  Revision L3-L5 posterior lateral fusion utilizing bone morphogenic protein, local autograft, and allograft.  Posterior segmental cortical pedicle screw instrumentation L3-L5   Surgeon:Tatum Massman A.Staley Budzinski, M.D.  Asst. Surgeon: Christella Noa  Anesthesia: General  Indication: 55 year old female status post previous L3-L5 decompression infusion presents with worsening back pain. Workup demonstrates evidence of mild loosening of her L3 screws. L4 and L5 screws appear to be solid. Fusion appears to be progressing reasonably well at L4-5 but solid union not appreciated at L3-4. Patient presents now for reexploration and revision of her lumbar fusion.  Operative note: After induction of anesthesia, patient position prone onto Wilson frame and a properly padded. Lumbar region prepped and draped sterilely. Incision made overlying L3-L5. Dissection performed bilaterally. Retractor placed. Previously placed cortical pedicle screw instrumentation was disassembled. Screws at L3 were loose and removed. Transverse processes of L3-L4 and L5 and identified and dissected free. These were decorticated using high-speed drill. Bone neurogenic protein-soaked sponge, locally harvested autograft and master graft bone was packed posterior laterally for hopeful later fusion. 6.5 mm nuvasive cortical pedicle screws were placed bilaterally at L3 with good purchase into the pedicles. These were confirmed in good position by both AP and lateral fluoroscopic images. Rod was placed over the screw heads. Locking caps and placed over the screw heads. Locking caps were then engaged with the construct under mild compression. Final images revealed good position of the bone graft and  hardware at the proper upper level with normal alignment is fine. Wound is irrigated one final time. Vancomycin powder placed the deep wound space. Wounds and close in layers with Vicryl sutures. Steri-Strips sterile dressing were applied. No apparent complications. Patient tolerated the procedure well and she returns to the recovery room postop.

## 2015-10-31 NOTE — Anesthesia Postprocedure Evaluation (Signed)
Anesthesia Post Note  Patient: Melanie Bowen  Procedure(s) Performed: Procedure(s) (LRB): EXPLORATION OF FUSION LUMBAR THREE-FOUR, LUMBAR FOUR-FIVE WITH REMOVAL OF HARDWARE  (N/A)  Patient location during evaluation: PACU Anesthesia Type: General Level of consciousness: awake and alert Pain management: pain level controlled Vital Signs Assessment: post-procedure vital signs reviewed and stable Respiratory status: spontaneous breathing, nonlabored ventilation, respiratory function stable and patient connected to nasal cannula oxygen Cardiovascular status: blood pressure returned to baseline and stable Postop Assessment: no signs of nausea or vomiting Anesthetic complications: no    Last Vitals:  Filed Vitals:   10/31/15 1045 10/31/15 1115  BP: 138/80 141/76  Pulse: 63 73  Temp:  36.7 C  Resp: 15 19    Last Pain:  Filed Vitals:   10/31/15 1118  PainSc: Asleep                 Effie Berkshire

## 2015-10-31 NOTE — Anesthesia Procedure Notes (Signed)
Procedure Name: Intubation Date/Time: 10/31/2015 8:00 AM Performed by: Maude Leriche D Pre-anesthesia Checklist: Patient identified, Emergency Drugs available, Suction available, Patient being monitored and Timeout performed Patient Re-evaluated:Patient Re-evaluated prior to inductionOxygen Delivery Method: Circle system utilized Preoxygenation: Pre-oxygenation with 100% oxygen Intubation Type: IV induction Ventilation: Mask ventilation without difficulty Laryngoscope Size: Miller and 2 Grade View: Grade I Tube type: Oral Tube size: 7.5 mm Number of attempts: 1 Airway Equipment and Method: Stylet Placement Confirmation: ETT inserted through vocal cords under direct vision,  positive ETCO2 and breath sounds checked- equal and bilateral Secured at: 21 cm Tube secured with: Tape Dental Injury: Teeth and Oropharynx as per pre-operative assessment

## 2015-10-31 NOTE — Brief Op Note (Signed)
10/31/2015  9:22 AM  PATIENT:  Melanie Bowen  55 y.o. female  PRE-OPERATIVE DIAGNOSIS:  DDD  POST-OPERATIVE DIAGNOSIS:  DDD  PROCEDURE:  Procedure(s): EXPLORATION OF FUSION LUMBAR THREE-FOUR, LUMBAR FOUR-FIVE WITH REMOVAL OF HARDWARE  (N/A)  SURGEON:  Surgeon(s) and Role:    * Earnie Larsson, MD - Primary    * Ashok Pall, MD - Assisting  PHYSICIAN ASSISTANT:   ASSISTANTS:    ANESTHESIA:   general  EBL:  Total I/O In: 1200 [I.V.:1200] Out: 200 [Urine:100; Blood:100]  BLOOD ADMINISTERED:none  DRAINS: none   LOCAL MEDICATIONS USED:  MARCAINE     SPECIMEN:  No Specimen  DISPOSITION OF SPECIMEN:  N/A  COUNTS:  YES  TOURNIQUET:  * No tourniquets in log *  DICTATION: .Dragon Dictation  PLAN OF CARE: Admit to inpatient   PATIENT DISPOSITION:  PACU - hemodynamically stable.   Delay start of Pharmacological VTE agent (>24hrs) due to surgical blood loss or risk of bleeding: yes

## 2015-11-01 NOTE — Evaluation (Signed)
Physical Therapy Evaluation Patient Details Name: HSER AUTIO MRN: [id number] DOB: June 25, 1961 Today's Date: 11/01/2015   History of Present Illness  55 y.o. s/p EXPLORATION OF FUSION LUMBAR THREE-FOUR, LUMBAR FOUR-FIVE WITH REMOVAL OF HARDWARE. PMH includes asthma, HIV, bronchitis, GERD, HTN, parasthesia of hands, blepharitis, allergic rhinitis, SOB-dyspnea, depression, arthritis, bilateral knee surgery, bilateral carpal tunnel surgery, back surgery, claustrophobia.  Clinical Impression  Patient presents with problems listed below.  Will benefit from acute PT to maximize functional independence prior to discharge home.    Follow Up Recommendations Home health PT;Supervision for mobility/OOB    Equipment Recommendations  None recommended by PT    Recommendations for Other Services       Precautions / Restrictions Precautions Precautions: Back Precaution Booklet Issued: Yes (comment) Precaution Comments: Reviewed back precautions.  Able to recall 1/3 from am session. Required Braces or Orthoses: Spinal Brace Spinal Brace: Lumbar corset;Applied in sitting position Restrictions Weight Bearing Restrictions: No      Mobility  Bed Mobility Overal bed mobility: Needs Assistance Bed Mobility: Rolling;Sidelying to Sit Rolling: Supervision Sidelying to sit: Supervision       General bed mobility comments: Continued to require verbal cues for correct technique to maintain back precautions.  Patient able to don brace independently in sitting.  Transfers Overall transfer level: Needs assistance Equipment used: Rolling walker (2 wheeled) Transfers: Sit to/from Stand Sit to Stand: Min guard         General transfer comment: Patient uses correct hand placement and technique.  Assist for safety.  Ambulation/Gait Ambulation/Gait assistance: Min guard Ambulation Distance (Feet): 300 Feet Assistive device: Rolling walker (2 wheeled) Gait Pattern/deviations: Step-through  pattern;Decreased stride length Gait velocity: decreased Gait velocity interpretation: Below normal speed for age/gender General Gait Details: Verbal cues for upright posture during gait.  Stairs            Wheelchair Mobility    Modified Rankin (Stroke Patients Only)       Balance                                             Pertinent Vitals/Pain Pain Assessment: 0-10 Pain Score: 10-Worst pain ever Pain Location: Back and hips Pain Descriptors / Indicators: Aching;Sore Pain Intervention(s): Monitored during session;Repositioned;Patient requesting pain meds-RN notified    Home Living Family/patient expects to be discharged to:: Private residence Living Arrangements: Spouse/significant other Available Help at Discharge: Family;Personal care attendant;Available PRN/intermittently (PCA available daily) Type of Home: Apartment Home Access: Level entry     Home Layout: One level Home Equipment: Bedside commode;Walker - 2 wheels      Prior Function Level of Independence: Needs assistance   Gait / Transfers Assistance Needed: used RW  ADL's / Homemaking Assistance Needed: assist with bathing, dressing, cooking, and cleaning        Hand Dominance   Dominant Hand: Right    Extremity/Trunk Assessment   Upper Extremity Assessment: Defer to OT evaluation           Lower Extremity Assessment: Generalized weakness         Communication   Communication: No difficulties  Cognition Arousal/Alertness: Awake/alert Behavior During Therapy: WFL for tasks assessed/performed;Impulsive Overall Cognitive Status: Within Functional Limits for tasks assessed (Decreased safety awareness - ? baseline)       Memory: Decreased recall of precautions  General Comments      Exercises        Assessment/Plan    PT Assessment Patient needs continued PT services  PT Diagnosis Difficulty walking;Generalized weakness;Acute pain    PT Problem List Decreased strength;Decreased balance;Decreased mobility;Decreased knowledge of use of DME;Decreased safety awareness;Decreased knowledge of precautions;Pain  PT Treatment Interventions DME instruction;Gait training;Functional mobility training;Therapeutic activities;Patient/family education   PT Goals (Current goals can be found in the Care Plan section) Acute Rehab PT Goals Patient Stated Goal: Decrease pain PT Goal Formulation: With patient Time For Goal Achievement: 11/08/15 Potential to Achieve Goals: Good    Frequency Min 5X/week   Barriers to discharge        Co-evaluation               End of Session Equipment Utilized During Treatment: Gait belt;Back brace Activity Tolerance: Patient tolerated treatment well Patient left: in chair;with call bell/phone within reach Nurse Communication: Mobility status;Patient requests pain meds         Time: [id number] PT Time Calculation (min) (ACUTE ONLY): 19 min   Charges:   PT Evaluation $PT Eval Moderate Complexity: 1 Procedure     PT G Codes:        Despina Pole 2015/11/24, 6:41 PM Carita Pian. Sanjuana Kava, [place] Pager [phone]

## 2015-11-01 NOTE — Progress Notes (Signed)
Spoke with Dr. Christella Noa, unaware of any previous pages.Melanie KitchenMarland KitchenThe current medical regimen is effective;  continue to reinforce dressing until morning rounds

## 2015-11-01 NOTE — Progress Notes (Signed)
No issues overnight. Pt reports back pain, but has been walking well last night. Tolerating diet. Voiding without difficulty  EXAM:  BP 125/65 mmHg  Pulse 85  Temp(Src) 99.2 F (37.3 C) (Oral)  Resp 18  Ht 5\' 6"  (1.676 m)  Wt 122.471 kg (270 lb)  BMI 43.60 kg/m2  SpO2 97%  Awake, alert, oriented  Speech fluent, appropriate  CN grossly intact  5/5 BUE/BLE  Wound c/d/i  IMPRESSION:  55 y.o. female POD#1 s/p lumbar fusion, doing well.   PLAN: - She is requesting eval by PT for any DME needs.  - Likely home tomorrow.

## 2015-11-01 NOTE — Progress Notes (Signed)
Left message for on call MD for Dr. Annette Stable to return call regarding pt incision drainage. No return call. Will report status off to night RN. Wendee Copp

## 2015-11-01 NOTE — Evaluation (Addendum)
Occupational Therapy Evaluation Patient Details Name: Melanie Bowen MRN: [id number] DOB: [birth year] Today's Date: 11/01/2015    History of Present Illness 55 y.o. s/p EXPLORATION OF FUSION LUMBAR THREE-FOUR, LUMBAR FOUR-FIVE WITH REMOVAL OF HARDWARE. PMH includes asthma, HIV, bronchitis, GERD, HTN, parasthesia of hands, blepharitis, allergic rhinitis, SOB-dyspnea, depression, arthritis, bilateral knee surgery, bilateral carpal tunnel surgery, back surgery, claustrophobia.   Clinical Impression   Pt s/p above. Pt getting assist with ADLs and IADLs, PTA. Feel pt will benefit from acute OT to increase independence prior to d/c.     Follow Up Recommendations  No OT follow up;Supervision - Intermittent    Equipment Recommendations  Other (comment) (reacher, long sponge, and pt unsure if she has sock aid)    Recommendations for Other Services       Precautions / Restrictions Precautions Precautions: Back;Fall Precaution Booklet Issued: No Precaution Comments: educated on back precautions Required Braces or Orthoses: Spinal Brace Spinal Brace: Lumbar corset;Applied in sitting position Restrictions Weight Bearing Restrictions: No      Mobility Bed Mobility Overal bed mobility: Needs Assistance Bed Mobility: Rolling;Sidelying to Sit Rolling: Supervision Sidelying to sit: Supervision       General bed mobility comments: cues for technique  Transfers Overall transfer level: Needs assistance Equipment used: Rolling walker (2 wheeled) Transfers: Sit to/from Stand Sit to Stand: Min guard              Balance      LOB when simulating functional task while standing in single leg stance-assist given.                                      ADL Overall ADL's : Needs assistance/impaired             Lower Body Bathing: Sit to/from stand;Maximal assistance (Maximal assistance using sitting and sit to stand techniques)  Upper Body Dressing : Set  up;Supervision/safety;Sitting   Lower Body Dressing: Maximal assistance;Sit to/from stand   Toilet Transfer: Min guard;Ambulation;RW (sit to stand from bed)           Functional mobility during ADLs: Min guard;Rolling walker (also ambulated without RW-Min guard) General ADL Comments: Discussed incorporating precautions into functional activities. Instructed to have clothing under brace.  Explained to pt that 3 in 1 can be used as shower chair.     Vision     Perception     Praxis      Pertinent Vitals/Pain Pain Assessment: 0-10 Pain Score: 10-Worst pain ever Pain Location: back and Rt hip area Pain Intervention(s): Monitored during session;Repositioned     Hand Dominance     Extremity/Trunk Assessment Upper Extremity Assessment Upper Extremity Assessment: Overall WFL for tasks assessed   Lower Extremity Assessment Lower Extremity Assessment: Defer to PT evaluation       Communication Communication Communication: No difficulties   Cognition Arousal/Alertness: Awake/alert Behavior During Therapy: WFL for tasks assessed/performed Overall Cognitive Status: Within Functional Limits for tasks assessed                     General Comments       Exercises       Shoulder Instructions      Home Living Family/patient expects to be discharged to:: Private residence Living Arrangements: Spouse/significant other Available Help at Discharge: Personal care attendant;Available PRN/intermittently;Family Type of Home: Apartment Home Access: Level entry     Home Layout:  One level     Bathroom Shower/Tub: Tub/shower unit         Home Equipment: Bedside commode;Walker - 2 wheels (thinks she may have sock aid?)          Prior Functioning/Environment Level of Independence: Needs assistance  Gait / Transfers Assistance Needed: used RW ADL's / Homemaking Assistance Needed: assist with bathing, dressing, cooking, and cleaning        OT Diagnosis: Acute  pain   OT Problem List: Pain;Decreased knowledge of precautions;Decreased knowledge of use of DME or AE;Impaired balance (sitting and/or standing);Decreased activity tolerance;Decreased range of motion   OT Treatment/Interventions: Self-care/ADL training;DME and/or AE instruction;Balance training;Patient/family education;Therapeutic activities    OT Goals(Current goals can be found in the care plan section) Acute Rehab OT Goals Patient Stated Goal: not stated OT Goal Formulation: With patient Time For Goal Achievement: 11/08/15 Potential to Achieve Goals: Good ADL Goals Pt Will Perform Grooming: with set-up;standing Pt Will Perform Lower Body Dressing: with set-up;with supervision;sit to/from stand;with adaptive equipment;with caregiver independent in assisting Pt Will Transfer to Toilet: ambulating;with set-up (3 in 1 over commode) Pt Will Perform Toileting - Clothing Manipulation and hygiene: with set-up;with supervision;sit to/from stand Pt Will Perform Tub/Shower Transfer: Tub transfer;ambulating;rolling walker;with supervision;with set-up;3 in 1 Additional ADL Goal #1: Pt will independently verbalize 3/3 back precautions and maintain in session.  OT Frequency: Min 2X/week   Barriers to D/C:            Co-evaluation              End of Session Equipment Utilized During Treatment: Gait belt;Rolling walker;Back brace  Activity Tolerance: Patient tolerated treatment well Patient left: in chair;with call bell/phone within reach;with family/visitor present   Time: 1153-1208 OT Time Calculation (min): 15 min Charges:  OT General Charges $OT Visit: 1 Procedure OT Evaluation $OT Eval Moderate Complexity: 1 Procedure G-CodesBenito Mccreedy OTR/L [id number] 11/01/2015, 12:54 PM

## 2015-11-02 MED ORDER — MIDAZOLAM HCL 2 MG/2ML IJ SOLN
4.0000 mg | Freq: Once | INTRAMUSCULAR | Status: AC
Start: 2015-11-02 — End: 2015-11-02
  Administered 2015-11-02: 4 mg via INTRAVENOUS
  Filled 2015-11-02: qty 4

## 2015-11-02 NOTE — Discharge Summary (Signed)
Physician Discharge Summary  Patient ID: Melanie Bowen MRN: [id number] DOB/AGE: Jan 14, 1961 55 y.o.  Admit date: 10/31/2015 Discharge date: 11/02/2015  Admission Diagnoses: L3-L4 pseudoarthrosis with failed hardware   Discharge Diagnoses: L3-L4 pseudoarthrosis with failed hardware   Active Problems:   Lumbar pseudoarthrosis   Discharged Condition: good  Hospital Course: Melanie Bowen was admitted and taken to the operating room for an uncomplicated redo fusion at L3/4. Post op her pain has been managed with oral medications. Her wound had been draining, but sutures were placed prior to discharge. She is voiding, ambulating, and tolerating a regular diet without difficulty. The wound is clean, and without signs of infection.   Consults: None  Significant Diagnostic Studies: none  Treatments: surgery: Reexploration of L3 L4 L5 fusion with removal of instrumentation.  Revision L3-L5 posterior lateral fusion utilizing bone morphogenic protein, local autograft, and allograft.  Posterior segmental cortical pedicle screw instrumentation L3-L5    Discharge Exam: Blood pressure 131/69, pulse 92, temperature 98.8 F (37.1 C), temperature source Oral, resp. rate 18, height 5\' 6"  (1.676 m), weight 270 lb (122.471 kg), SpO2 96 %. General appearance: alert, cooperative, appears stated age and no distress Neurologic: Mental status: Alert, oriented, thought content appropriate Cranial nerves: normal Motor: moving lower extremities well, normal strength, muscle tone, and bulk.   Disposition: 01-Home or Self Care     Medication List    TAKE these medications        albuterol 108 (90 Base) MCG/ACT inhaler  Commonly known as:  PROVENTIL HFA;VENTOLIN HFA  Inhale 90 mcg into the lungs every 6 (six) hours as needed for wheezing or shortness of breath.     amLODipine 10 MG tablet  Commonly known as:  NORVASC  Take 10 mg by mouth daily before breakfast.     diazepam 5 MG tablet  Commonly  known as:  VALIUM  Take 1-2 tablets (5-10 mg total) by mouth every 6 (six) hours as needed for muscle spasms.     efavirenz-emtricitabine-tenofovir 600-200-300 MG tablet  Commonly known as:  ATRIPLA  Take 1 tablet by mouth at bedtime.     fluticasone 50 MCG/ACT nasal spray  Commonly known as:  FLONASE  Place 50 g into the nose daily as needed for allergies or rhinitis.     Fluticasone-Salmeterol 500-50 MCG/DOSE Aepb  Commonly known as:  ADVAIR  Inhale 500 mcg into the lungs 2 (two) times daily.     hydrochlorothiazide 25 MG tablet  Commonly known as:  HYDRODIURIL  Take 25 mg by mouth daily.     ipratropium-albuterol 0.5-2.5 (3) MG/3ML Soln  Commonly known as:  DUONEB  Inhale 3 mLs into the lungs 3 (three) times daily as needed (shortness of breath, wheezing).     losartan 50 MG tablet  Commonly known as:  COZAAR  Take 50 mg by mouth daily.     nicotine 10 MG inhaler  Commonly known as:  NICOTROL  Inhale 1 cartridge (1 continuous puffing total) into the lungs as needed for smoking cessation.     omeprazole 40 MG capsule  Commonly known as:  PRILOSEC  Take 40 mg by mouth daily before breakfast.     oxyCODONE 15 MG immediate release tablet  Commonly known as:  ROXICODONE  Take 1 tablet (15 mg total) by mouth every 6 (six) hours as needed for pain.     promethazine 25 MG tablet  Commonly known as:  PHENERGAN  Take 25 mg by mouth as needed for nausea or  vomiting.     valACYclovir 1000 MG tablet  Commonly known as:  VALTREX  Take 1,000 mg by mouth at bedtime.         Signed: Gal Feldhaus L 11/02/2015, 12:00 PM

## 2015-11-02 NOTE — Progress Notes (Signed)
Pt discharged home with fiance. IV discontinued. Discharge instructions and paper prescription given. Pt has no questions at this time. Walker supplied by DME. Pt leaving unit via wheelchair with NT at 1340. Wendee Copp

## 2015-11-02 NOTE — Care Management Note (Signed)
Case Management Note  Patient Details  Name: BROOKLEN THOMANN MRN: [id number] Date of Birth: July 19, 1961  Subjective/Objective:                  L3-L4 pseudoarthrosis with failed hardware Action/Plan: Discharge planning Expected Discharge Date:  11/02/15               Expected Discharge Plan:  [place]  In-House Referral:     Discharge planning Services  CM Consult  Post Acute Care Choice:  Home Health Choice offered to:  Patient  DME Arranged:  3-N-1, Walker rolling DME Agency:  [place]:  PT [facility] Agency:  [place]  Status of Service:  Completed, signed off  Medicare Important Message Given:    Date Medicare IM Given:    Medicare IM give by:    Date Additional Medicare IM Given:    Additional Medicare Important Message give by:     If discussed at [place] of Stay Meetings, dates discussed:    Additional Comments: Cm spoke with pt for choice of home health agency.  Pt chooses [facility] to render HHPT.  Pt had received a rolling walker and 3n1 in 2016.  [facility] DME rep, Merry Proud is checking to see if pt needs to pay out of pocket for a new rolling walker and 3n1. Referral called to [facility] rep, Tiffany.   Dellie Catholic, RN 11/02/2015, 12:55 PM

## 2015-11-02 NOTE — Progress Notes (Addendum)
Pt at desk saying she is ready to leave, that she needs her walker. rn explained that those are orders that are delivered.  rep is here at desk, supplying pt will equipment. Tech will take pt out in wheelchair.

## 2015-12-24 ENCOUNTER — Ambulatory Visit: Payer: Medicare Other | Admitting: Internal Medicine

## 2015-12-29 DIAGNOSIS — K922 Gastrointestinal hemorrhage, unspecified: Secondary | ICD-10-CM

## 2015-12-29 HISTORY — DX: Gastrointestinal hemorrhage, unspecified: K92.2

## 2016-01-07 ENCOUNTER — Emergency Department
Admission: EM | Admit: 2016-01-07 | Discharge: 2016-01-07 | Disposition: A | Discharge status: Home / Self Care | Payer: Medicare Other | Source: Home / Self Care | Attending: Emergency Medicine | Admitting: Emergency Medicine

## 2016-01-07 ENCOUNTER — Encounter: Payer: Self-pay | Admitting: Emergency Medicine

## 2016-01-07 DIAGNOSIS — I10 Essential (primary) hypertension: Secondary | ICD-10-CM | POA: Insufficient documentation

## 2016-01-07 DIAGNOSIS — K649 Unspecified hemorrhoids: Secondary | ICD-10-CM

## 2016-01-07 DIAGNOSIS — Z7951 Long term (current) use of inhaled steroids: Secondary | ICD-10-CM

## 2016-01-07 DIAGNOSIS — K922 Gastrointestinal hemorrhage, unspecified: Secondary | ICD-10-CM | POA: Diagnosis not present

## 2016-01-07 DIAGNOSIS — F329 Major depressive disorder, single episode, unspecified: Secondary | ICD-10-CM

## 2016-01-07 DIAGNOSIS — F17210 Nicotine dependence, cigarettes, uncomplicated: Secondary | ICD-10-CM

## 2016-01-07 DIAGNOSIS — M5136 Other intervertebral disc degeneration, lumbar region: Secondary | ICD-10-CM

## 2016-01-07 DIAGNOSIS — Z79899 Other long term (current) drug therapy: Secondary | ICD-10-CM

## 2016-01-07 DIAGNOSIS — J45909 Unspecified asthma, uncomplicated: Secondary | ICD-10-CM

## 2016-01-07 DIAGNOSIS — K921 Melena: Secondary | ICD-10-CM | POA: Diagnosis not present

## 2016-01-07 LAB — CBC
HCT: 36.3 % (ref 35.0–47.0)
Hemoglobin: 12.3 g/dL (ref 12.0–16.0)
MCH: 32.2 pg (ref 26.0–34.0)
MCHC: 33.9 g/dL (ref 32.0–36.0)
MCV: 94.7 fL (ref 80.0–100.0)
Platelets: 357 10*3/uL (ref 150–440)
RBC: 3.84 MIL/uL (ref 3.80–5.20)
RDW: 14.8 % — ABNORMAL HIGH (ref 11.5–14.5)
WBC: 7.8 10*3/uL (ref 3.6–11.0)

## 2016-01-07 LAB — COMPREHENSIVE METABOLIC PANEL
ALT: 16 U/L (ref 14–54)
AST: 19 U/L (ref 15–41)
Albumin: 3.4 g/dL — ABNORMAL LOW (ref 3.5–5.0)
Alkaline Phosphatase: 124 U/L (ref 38–126)
Anion gap: 8 (ref 5–15)
BUN: 25 mg/dL — ABNORMAL HIGH (ref 6–20)
CO2: 21 mmol/L — ABNORMAL LOW (ref 22–32)
Calcium: 8.6 mg/dL — ABNORMAL LOW (ref 8.9–10.3)
Chloride: 112 mmol/L — ABNORMAL HIGH (ref 101–111)
Creatinine, Ser: 1.48 mg/dL — ABNORMAL HIGH (ref 0.44–1.00)
GFR calc Af Amer: 46 mL/min — ABNORMAL LOW (ref >60)
GFR calc non Af Amer: 39 mL/min — ABNORMAL LOW (ref >60)
Glucose, Bld: 114 mg/dL — ABNORMAL HIGH (ref 65–99)
Potassium: 4 mmol/L (ref 3.5–5.1)
Sodium: 141 mmol/L (ref 135–145)
Total Bilirubin: 0.4 mg/dL (ref 0.3–1.2)
Total Protein: 6.7 g/dL (ref 6.5–8.1)

## 2016-01-07 LAB — TYPE AND SCREEN
ABO/RH(D): O POS
Antibody Screen: NEGATIVE

## 2016-01-07 LAB — LIPASE, BLOOD: Lipase: 35 U/L (ref 11–51)

## 2016-01-07 LAB — ABO/RH: ABO/RH(D): O POS

## 2016-01-07 MED ORDER — DOCUSATE SODIUM 100 MG PO CAPS: 200.0000 mg | ORAL_CAPSULE | Freq: Two times a day (BID) | ORAL | Status: DC

## 2016-01-07 MED ORDER — PRAMOXINE HCL 1 % RE FOAM: 1.0000 "application " | Freq: Three times a day (TID) | RECTAL | Status: DC | PRN

## 2016-01-07 MED ORDER — SODIUM CHLORIDE 0.9 % IV BOLUS (SEPSIS)
1000.0000 mL | Freq: Once | INTRAVENOUS | Status: AC
  Administered 2016-01-07: 1000 mL via INTRAVENOUS

## 2016-01-07 MED ORDER — TUCKS 50 % EX PADS: 1.0000 "application " | MEDICATED_PAD | CUTANEOUS | Status: DC | PRN

## 2016-01-07 NOTE — ED Notes (Signed)
Patient states she started taking Allegra yesterday, since then has noticed bright red in stools.

## 2016-01-07 NOTE — ED Provider Notes (Signed)
[facility] Emergency Department Provider Note  ____________________________________________  Time seen: 4:00 PM  I have reviewed the triage vital signs and the nursing notes.   HISTORY  Chief Complaint Rectal Bleeding and Dizziness    HPI Melanie Bowen is a 55 y.o. female who complains of rectal bleeding this morning. No abdominal pain nausea vomiting. No dizziness or syncope chest pain shortness of breath. No history of GI bleeding. Does not take any anticoagulants. Has been compliant with her usual medical therapy. Just noticed it this morning while using the bathroom this morning. She blames it on starting Allegra yesterday.     Past Medical History  Diagnosis Date  . Asthma   . Cough   . Genital herpes   . HIV (human immunodeficiency virus infection) ([place])   . Bronchitis   . GERD (gastroesophageal reflux disease)   . Eye irritation   . Paresthesia of hand, bilateral   . Blepharitis   . Allergic rhinitis   . Hypertension   . Cigarette smoker   . Headache   . Shortness of breath dyspnea     at times due to asthma  . Depression   . Arthritis     DDD  . Claustrophobia      Patient Active Problem List   Diagnosis Date Noted  . Lumbar pseudoarthrosis 10/31/2015  . Chronic cough   . Cough 06/19/2015  . DDD (degenerative disc disease), lumbar 12/31/2014  . Degenerative disc disease, lumbar 12/31/2014  . Asthma, chronic 11/12/2014     Past Surgical History  Procedure Laterality Date  . Knee surgery Bilateral 2003,2007    total Joint  . Hand surgery Bilateral     carpal tunnel  . Tubal ligation    . Laparoscopy for ectopic pregnancy    . Bunionectomy Bilateral     feet  . Finger fracture Right     5th finger  . Back surgery may 2016      lumbar   . Video bronchoscopy N/A 06/30/2015    Procedure: VIDEO BRONCHOSCOPY WITHOUT FLUORO;  Surgeon: Vilinda Boehringer, MD;  Location: [facility] ORS;  Service: Cardiopulmonary;  Laterality: N/A;   . Diagnostic laparoscopy    . Back surgery    . Joint replacement Bilateral     knees     Current Outpatient Rx  Name  Route  Sig  Dispense  Refill  . albuterol (PROVENTIL HFA;VENTOLIN HFA) 108 (90 BASE) MCG/ACT inhaler   Inhalation   Inhale 90 mcg into the lungs every 6 (six) hours as needed for wheezing or shortness of breath.          Marland Kitchen amLODipine (NORVASC) 10 MG tablet   Oral   Take 10 mg by mouth daily before breakfast.          . diazepam (VALIUM) 5 MG tablet   Oral   Take 1-2 tablets (5-10 mg total) by mouth every 6 (six) hours as needed for muscle spasms.   30 tablet   0   . docusate sodium (COLACE) 100 MG capsule   Oral   Take 2 capsules (200 mg total) by mouth 2 (two) times daily.   120 capsule   0   . efavirenz-emtricitabine-tenofovir (ATRIPLA) 600-200-300 MG per tablet   Oral   Take 1 tablet by mouth at bedtime.         . fluticasone (FLONASE) 50 MCG/ACT nasal spray   Nasal   Place 50 g into the nose daily as needed for  allergies or rhinitis.          . Fluticasone-Salmeterol (ADVAIR) 500-50 MCG/DOSE AEPB   Inhalation   Inhale 500 mcg into the lungs 2 (two) times daily.          . hydrochlorothiazide (HYDRODIURIL) 25 MG tablet   Oral   Take 25 mg by mouth daily.         Marland Kitchen ipratropium-albuterol (DUONEB) 0.5-2.5 (3) MG/3ML SOLN   Inhalation   Inhale 3 mLs into the lungs 3 (three) times daily as needed (shortness of breath, wheezing).          Marland Kitchen losartan (COZAAR) 50 MG tablet   Oral   Take 50 mg by mouth daily.         . nicotine (NICOTROL) 10 MG inhaler   Inhalation   Inhale 1 cartridge (1 continuous puffing total) into the lungs as needed for smoking cessation.   42 each   0   . omeprazole (PRILOSEC) 40 MG capsule   Oral   Take 40 mg by mouth daily before breakfast.          . oxyCODONE (ROXICODONE) 15 MG immediate release tablet   Oral   Take 1 tablet (15 mg total) by mouth every 6 (six) hours as needed for pain.   90  tablet   0   . pramoxine (PROCTOFOAM) 1 % foam   Rectal   Place 1 application rectally 3 (three) times daily as needed for itching.   15 g   0   . promethazine (PHENERGAN) 25 MG tablet   Oral   Take 25 mg by mouth as needed for nausea or vomiting.          . valACYclovir (VALTREX) 1000 MG tablet   Oral   Take 1,000 mg by mouth at bedtime.          [name] 50 % PADS   Apply externally   Apply 1 application topically every 2 (two) hours as needed.   40 each   0      Allergies Morphine and related; Gabapentin; and Zanaflex    Family History  Problem Relation Age of Onset  . Breast cancer Sister     Social History Social History  Substance Use Topics  . Smoking status: Light Tobacco Smoker -- 0.20 packs/day for 38 years    Types: Cigarettes  . Smokeless tobacco: Never Used  . Alcohol Use: 3.0 oz/week    5 Glasses of wine, 0 Standard drinks or equivalent per week     Comment: wine - couple days of week    Review of Systems  Constitutional:   No fever or chills.  Eyes:   No vision changes.  ENT:   No sore throat. No rhinorrhea. Cardiovascular:   No chest pain. Respiratory:   No dyspnea or cough. Gastrointestinal:   Negative for abdominal pain, vomiting and diarrhea. Positive rectal bleeding Genitourinary:   Negative for dysuria or difficulty urinating. Musculoskeletal:   Negative for focal pain or swelling Neurological:   Negative for headaches 10-point ROS otherwise negative.  ____________________________________________   PHYSICAL EXAM:  VITAL SIGNS: ED Triage Vitals  Enc Vitals Group     BP 01/07/16 1408 106/59 mmHg     Pulse Rate 01/07/16 1408 92     Resp 01/07/16 1408 20     Temp 01/07/16 1408 98.1 F (36.7 C)     Temp Source 01/07/16 1408 Oral     SpO2 01/07/16 1408 96 %  Weight 01/07/16 1408 267 lb (121.11 kg)     Height 01/07/16 1408 5\' 6"  (1.676 m)     Head Cir --      Peak Flow --      Pain Score 01/07/16 1708 6      Pain Loc --      Pain Edu? --      Excl. in [place]? --     Vital signs reviewed, nursing assessments reviewed.   Constitutional:   Alert and oriented. Well appearing and in no distress. Eyes:   No scleral icterus. No conjunctival pallor. PERRL. EOMI.  No nystagmus. ENT   Head:   Normocephalic and atraumatic.   Nose:   No congestion/rhinnorhea. No septal hematoma   Mouth/Throat:   MMM, no pharyngeal erythema. No peritonsillar mass.    Neck:   No stridor. No SubQ emphysema. No meningismus. Hematological/Lymphatic/Immunilogical:   No cervical lymphadenopathy. Cardiovascular:   RRR. Symmetric bilateral radial and DP pulses.  No murmurs.  Respiratory:   Normal respiratory effort without tachypnea nor retractions. Breath sounds are clear and equal bilaterally. No wheezes/rales/rhonchi. Gastrointestinal:   Soft and nontender. Non distended. There is no CVA tenderness.  No rebound, rigidity, or guarding. Rectal exam performed with nurse, there is no visible hemorrhoid outside the anus, but there is a palpable and very tender external hemorrhoid on digital exam. There is thick clotted blood overlying the hemorrhoid as well. Genitourinary:   deferred Musculoskeletal:   Nontender with normal range of motion in all extremities. No joint effusions.  No lower extremity tenderness.  No edema. Neurologic:   Normal speech and language.  CN 2-10 normal. Motor grossly intact. No gross focal neurologic deficits are appreciated.  Skin:    Skin is warm, dry and intact. No rash noted.  No petechiae, purpura, or bullae.  ____________________________________________    LABS (pertinent positives/negatives) (all labs ordered are listed, but only abnormal results are displayed) Labs Reviewed  COMPREHENSIVE METABOLIC PANEL - Abnormal; Notable for the following:    Chloride 112 (*)    CO2 21 (*)    Glucose, Bld 114 (*)    BUN 25 (*)    Creatinine, Ser 1.48 (*)    Calcium 8.6 (*)    Albumin 3.4  (*)    GFR calc non Af Amer 39 (*)    GFR calc Af Amer 46 (*)    All other components within normal limits  CBC - Abnormal; Notable for the following:    RDW 14.8 (*)    All other components within normal limits  LIPASE, BLOOD  URINALYSIS COMPLETEWITH MICROSCOPIC ([facility] ONLY)  TYPE AND SCREEN  ABO/RH   ____________________________________________   EKG  Interpreted by me Normal sinus rhythm rate of 97, normal axis and intervals. Poor R-wave progression in anterior precordial leads. Normal ST segments and T waves.  ____________________________________________    RADIOLOGY    ____________________________________________   PROCEDURES   ____________________________________________   INITIAL IMPRESSION / ASSESSMENT AND PLAN / ED COURSE  Pertinent labs & imaging results that were available during my care of the patient were reviewed by me and considered in my medical decision making (see chart for details).  Patient presents with rectal bleeding which appears to be due to a external hemorrhoid. Labs are unremarkable, vital signs are normal, exam is reassuring and the patient is not in distress, and comfortable energetic and happy. Results were discussed with her, she will treat symptomatically and follow up with primary care and/or surgery as needed.  Labs to suggest a degree of dehydration with a creatinine 1.4, patient is given IV fluids for this. She stated she did not want to wait for the full liter of fluids to infuse and is happy to be discharged right away.     ____________________________________________   FINAL CLINICAL IMPRESSION(S) / ED DIAGNOSES  Final diagnoses:  Bleeding hemorrhoid  Rectal bleeding     Portions of this note were generated with dragon dictation software. Dictation errors may occur despite best attempts at proofreading.   Carrie Mew, MD 01/07/16 [phone]

## 2016-01-07 NOTE — ED Notes (Signed)
Stool specimen collected and sent to lab

## 2016-01-07 NOTE — Discharge Instructions (Signed)
Hemorrhoids °Hemorrhoids are swollen veins around the rectum or anus. There are two types of hemorrhoids:  °· Internal hemorrhoids. These occur in the veins just inside the rectum. They may poke through to the outside and become irritated and painful. °· External hemorrhoids. These occur in the veins outside the anus and can be felt as a painful swelling or hard lump near the anus. °CAUSES °· Pregnancy.   °· Obesity.   °· Constipation or diarrhea.   °· Straining to have a bowel movement.   °· Sitting for long periods on the toilet. °· Heavy lifting or other activity that caused you to strain. °· Anal intercourse. °SYMPTOMS  °· Pain.   °· Anal itching or irritation.   °· Rectal bleeding.   °· Fecal leakage.   °· Anal swelling.   °· One or more lumps around the anus.   °DIAGNOSIS  °Your caregiver may be able to diagnose hemorrhoids by visual examination. Other examinations or tests that may be performed include:  °· Examination of the rectal area with a gloved hand (digital rectal exam).   °· Examination of anal canal using a small tube (scope).   °· A blood test if you have lost a significant amount of blood. °· A test to look inside the colon (sigmoidoscopy or colonoscopy). °TREATMENT °Most hemorrhoids can be treated at home. However, if symptoms do not seem to be getting better or if you have a lot of rectal bleeding, your caregiver may perform a procedure to help make the hemorrhoids get smaller or remove them completely. Possible treatments include:  °· Placing a rubber band at the base of the hemorrhoid to cut off the circulation (rubber band ligation).   °· Injecting a chemical to shrink the hemorrhoid (sclerotherapy).   °· Using a tool to burn the hemorrhoid (infrared light therapy).   °· Surgically removing the hemorrhoid (hemorrhoidectomy).   °· Stapling the hemorrhoid to block blood flow to the tissue (hemorrhoid stapling).   °HOME CARE INSTRUCTIONS  °· Eat foods with fiber, such as whole grains, beans,  nuts, fruits, and vegetables. Ask your doctor about taking products with added fiber in them (fiber supplements). °· Increase fluid intake. Drink enough water and fluids to keep your urine clear or pale yellow.   °· Exercise regularly.   °· Go to the bathroom when you have the urge to have a bowel movement. Do not wait.   °· Avoid straining to have bowel movements.   °· Keep the anal area dry and clean. Use wet toilet paper or moist towelettes after a bowel movement.   °· Medicated creams and suppositories may be used or applied as directed.   °· Only take over-the-counter or prescription medicines as directed by your caregiver.   °· Take warm sitz baths for 15-20 minutes, 3-4 times a day to ease pain and discomfort.   °· Place ice packs on the hemorrhoids if they are tender and swollen. Using ice packs between sitz baths may be helpful.   °· Put ice in a plastic bag.   °· Place a towel between your skin and the bag.   °· Leave the ice on for 15-20 minutes, 3-4 times a day.   °· Do not use a donut-shaped pillow or sit on the toilet for long periods. This increases blood pooling and pain.   °SEEK MEDICAL CARE IF: °· You have increasing pain and swelling that is not controlled by treatment or medicine. °· You have uncontrolled bleeding. °· You have difficulty or you are unable to have a bowel movement. °· You have pain or inflammation outside the area of the hemorrhoids. °MAKE SURE YOU: °· Understand these instructions. °·   Will watch your condition.  Will get help right away if you are not doing well or get worse.   This information is not intended to replace advice given to you by your health care provider. Make sure you discuss any questions you have with your health care provider.   Document Released: 08/13/2000 Document Revised: 08/02/2012 Document Reviewed: 06/20/2012 Elsevier Interactive Patient Education 2016 [employer] Inc[employer]  Nonsurgical Procedures for Hemorrhoids Nonsurgical procedures can be used to  treat hemorrhoids. Hemorrhoids are swollen veins that are inside the rectum (internal hemorrhoids) or around the anus (external hemorrhoids). They are caused by increased pressure in the anal area. This pressure may result from straining to have a bowel movement (constipation), diarrhea, pregnancy, obesity, anal sex, or sitting for long periods of time. Hemorrhoids can cause symptoms such as pain and bleeding. Various procedures may be performed if diet changes, lifestyle changes, and other treatments do not help your symptoms. Some of these procedures do not involve surgery. Three common nonsurgical procedures are:  Rubber band ligation. Rubber bands are used to cut off the blood supply to the hemorrhoids.  Sclerotherapy. Medicine is injected into the hemorrhoids to shrink them.  Infrared coagulation. A type of light energy is used to get rid of the hemorrhoids. LET [facility] CARE PROVIDER KNOW ABOUT:  Any allergies you have.  All medicines you are taking, including vitamins, herbs, eye drops, creams, and over-the-counter medicines.  Previous problems you or members of your family have had with the use of anesthetics.  Any blood disorders you have.  Previous surgeries you have had.  Any medical conditions you have.  Whether you are pregnant or may be pregnant. RISKS AND COMPLICATIONS Generally, this is a safe procedure. However, problems may occur, including:  Infection.  Bleeding.  Pain. BEFORE THE PROCEDURE  Ask your health care provider about:  Changing or stopping your regular medicines. This is especially important if you are taking diabetes medicines or blood thinners.  Taking medicines such as aspirin and ibuprofen. These medicines can thin your blood. Do not take these medicines before your procedure if your health care provider instructs you not to.  You may need to have a procedure to examine the inside of your colon with a scope (colonoscopy). Your health care  provider may do this to make sure that there are no other causes for your bleeding or pain. PROCEDURE  Your health care provider will clean your rectal area with a rinsing solution.  A lubricating jelly may be placed into your rectum. The jelly may contain a medicine to numb the area (local anesthetic).  Your health care provider will insert a short scope (anoscope) into your rectum to examine the hemorrhoids.  One of the following techniques will be used. Rubber Band Ligation Your health care provider will place medical instruments through the scope to put rubber bands around the base of your hemorrhoids. The bands will cut off the blood supply to the hemorrhoids. The hemorrhoids will fall off after several days. Sclerotherapy Your health care provider will inject medicine through the scope into your hemorrhoids. This will cause them to shrink and dry up. Infrared Coagulation Your health care provider will shine a type of light through the scope onto your hemorrhoids. This light will generate energy (infrared radiation). It will cause the hemorrhoids to scar and then fall off. Each of these procedures may vary among health care providers and hospitals. AFTER THE PROCEDURE  You will be monitored to make sure that you  have no bleeding.  Return to your normal activities as told by your health care provider.   This information is not intended to replace advice given to you by your health care provider. Make sure you discuss any questions you have with your health care provider.   Document Released: 06/13/2009 Document Revised: 05/07/2015 Document Reviewed: 11/11/2014 Elsevier Interactive Patient Education 2016 [place] A disposable sitz bath is a plastic basin that fits over the toilet. A bag is hung above the toilet and is connected to a tube that opens into the disposable sitz bath. The bag is filled with warm water that can flow into the basin through the  tube.  HOW TO USE A DISPOSABLE SITZ BATH  Close the clamp on the tubing before filling the bag with water. This is to prevent leakage.  Fill the sitz bath basin and the plastic bag with warm water.  Place the filled basin on the toilet with the seat raised. Make sure the overflow opening is facing toward the back of the toilet.  Hang the filled plastic bag overhead on a hook or towel rack close to the toilet. When the bag is unclamped, a steady stream of water will flow from the bag, through the tubing, and into the basin.  Attach the tubing to the opening on the basin.  Sit on the basin positioned on the toilet seat and release the clamp. This will allow warm water to flush the area around your genitals and anus (perineum).  Remain sitting on the basin for approximately 15 to 20 minutes.  Stand up and pat the perineum area dry. If needed, apply clean bandages (dressings) to the affected area.  Tip the basin into the toilet to remove any remaining water and flush the toilet.  Wash the basin with warm water and soap. Let it dry in the sink.  Store the basin and tubing in a clean, dry area.  Wash your hands with soap and water. SEEK MEDICAL CARE IF: You get worse instead of better. Stop the sitz baths if you get worse. MAKE SURE YOU:  Understand these instructions.  Will watch your condition.  Will get help right away if you are not doing well or get worse.   This information is not intended to replace advice given to you by your health care provider. Make sure you discuss any questions you have with your health care provider.   Document Released: 02/15/2012 Document Revised: 05/10/2012 Document Reviewed: 02/15/2012 Elsevier Interactive Patient Education 2016 Elsevier Inc.  High-Fiber Diet Fiber, also called dietary fiber, is a type of carbohydrate found in fruits, vegetables, whole grains, and beans. A high-fiber diet can have many health benefits. Your health care provider  may recommend a high-fiber diet to help:  Prevent constipation. Fiber can make your bowel movements more regular.  Lower your cholesterol.  Relieve hemorrhoids, uncomplicated diverticulosis, or irritable bowel syndrome.  Prevent overeating as part of a weight-loss plan.  Prevent heart disease, type 2 diabetes, and certain cancers. WHAT IS MY PLAN? The recommended daily intake of fiber includes:  38 grams for men under age 18.  95 grams for men over age 36.  72 grams for women under age 34.  76 grams for women over age 19. You can get the recommended daily intake of dietary fiber by eating a variety of fruits, vegetables, grains, and beans. Your health care provider may also recommend a fiber supplement if it is not possible to get  enough fiber through your diet. WHAT DO I NEED TO KNOW ABOUT A HIGH-FIBER DIET?  Fiber supplements have not been widely studied for their effectiveness, so it is better to get fiber through food sources.  Always check the fiber content on thenutrition facts label of any prepackaged food. Look for foods that contain at least 5 grams of fiber per serving.  Ask your dietitian if you have questions about specific foods that are related to your condition, especially if those foods are not listed in the following section.  Increase your daily fiber consumption gradually. Increasing your intake of dietary fiber too quickly may cause bloating, cramping, or gas.  Drink plenty of water. Water helps you to digest fiber. WHAT FOODS CAN I EAT? Grains Whole-grain breads. Multigrain cereal. Oats and oatmeal. Brown rice. Barley. Bulgur wheat. [place]. Bran muffins. Popcorn. Rye wafer crackers. Vegetables Sweet potatoes. Spinach. Kale. Artichokes. Cabbage. Broccoli. Green peas. Carrots. Squash. Fruits Berries. Pears. Apples. Oranges. Avocados. Prunes and raisins. Dried figs. Meats and Other Protein Sources [place], kidney, pinto, and soy beans. Split peas. Lentils.  Nuts and seeds. Dairy Fiber-fortified yogurt. Beverages Fiber-fortified soy milk. Fiber-fortified orange juice. Other Fiber bars. The items listed above may not be a complete list of recommended foods or beverages. Contact your dietitian for more options. WHAT FOODS ARE NOT RECOMMENDED? Grains White bread. Pasta made with refined flour. White rice. Vegetables Fried potatoes. Canned vegetables. Well-cooked vegetables.  Fruits Fruit juice. Cooked, strained fruit. Meats and Other Protein Sources Fatty cuts of meat. Fried Sales executive or fried fish. Dairy Milk. Yogurt. Cream cheese. Sour cream. Beverages Soft drinks. Other Cakes and pastries. Butter and oils. The items listed above may not be a complete list of foods and beverages to avoid. Contact your dietitian for more information. WHAT ARE SOME TIPS FOR INCLUDING HIGH-FIBER FOODS IN MY DIET?  Eat a wide variety of high-fiber foods.  Make sure that half of all grains consumed each day are whole grains.  Replace breads and cereals made from refined flour or white flour with whole-grain breads and cereals.  Replace white rice with brown rice, bulgur wheat, or millet.  Start the day with a breakfast that is high in fiber, such as a cereal that contains at least 5 grams of fiber per serving.  Use beans in place of meat in soups, salads, or pasta.  Eat high-fiber snacks, such as berries, raw vegetables, nuts, or popcorn.   This information is not intended to replace advice given to you by your health care provider. Make sure you discuss any questions you have with your health care provider.   Document Released: 08/16/2005 Document Revised: 09/06/2014 Document Reviewed: 01/29/2014 Elsevier Interactive Patient Education [employer].

## 2016-01-08 ENCOUNTER — Other Ambulatory Visit: Payer: Self-pay | Admitting: Family Medicine

## 2016-01-08 DIAGNOSIS — Z1239 Encounter for other screening for malignant neoplasm of breast: Secondary | ICD-10-CM

## 2016-01-09 ENCOUNTER — Encounter: Payer: Self-pay | Admitting: Emergency Medicine

## 2016-01-09 ENCOUNTER — Emergency Department: Payer: Medicare Other

## 2016-01-09 DIAGNOSIS — B20 Human immunodeficiency virus [HIV] disease: Secondary | ICD-10-CM | POA: Diagnosis present

## 2016-01-09 DIAGNOSIS — J45909 Unspecified asthma, uncomplicated: Secondary | ICD-10-CM | POA: Diagnosis present

## 2016-01-09 DIAGNOSIS — D62 Acute posthemorrhagic anemia: Secondary | ICD-10-CM | POA: Diagnosis present

## 2016-01-09 DIAGNOSIS — J449 Chronic obstructive pulmonary disease, unspecified: Secondary | ICD-10-CM | POA: Diagnosis present

## 2016-01-09 DIAGNOSIS — Z96653 Presence of artificial knee joint, bilateral: Secondary | ICD-10-CM | POA: Diagnosis present

## 2016-01-09 DIAGNOSIS — K921 Melena: Principal | ICD-10-CM | POA: Diagnosis present

## 2016-01-09 DIAGNOSIS — K219 Gastro-esophageal reflux disease without esophagitis: Secondary | ICD-10-CM | POA: Diagnosis present

## 2016-01-09 DIAGNOSIS — Z6841 Body Mass Index (BMI) 40.0 and over, adult: Secondary | ICD-10-CM

## 2016-01-09 DIAGNOSIS — Z803 Family history of malignant neoplasm of breast: Secondary | ICD-10-CM

## 2016-01-09 DIAGNOSIS — F329 Major depressive disorder, single episode, unspecified: Secondary | ICD-10-CM | POA: Diagnosis present

## 2016-01-09 DIAGNOSIS — F17210 Nicotine dependence, cigarettes, uncomplicated: Secondary | ICD-10-CM | POA: Diagnosis present

## 2016-01-09 DIAGNOSIS — Z79899 Other long term (current) drug therapy: Secondary | ICD-10-CM

## 2016-01-09 DIAGNOSIS — I10 Essential (primary) hypertension: Secondary | ICD-10-CM | POA: Diagnosis present

## 2016-01-09 LAB — CBC
HCT: 18.7 % — ABNORMAL LOW (ref 35.0–47.0)
Hemoglobin: 6.3 g/dL — ABNORMAL LOW (ref 12.0–16.0)
MCH: 31.7 pg (ref 26.0–34.0)
MCHC: 33.4 g/dL (ref 32.0–36.0)
MCV: 94.9 fL (ref 80.0–100.0)
Platelets: 274 10*3/uL (ref 150–440)
RBC: 1.98 MIL/uL — ABNORMAL LOW (ref 3.80–5.20)
RDW: 14.7 % — ABNORMAL HIGH (ref 11.5–14.5)
WBC: 8.7 10*3/uL (ref 3.6–11.0)

## 2016-01-09 LAB — BASIC METABOLIC PANEL
Anion gap: 4 — ABNORMAL LOW (ref 5–15)
BUN: 13 mg/dL (ref 6–20)
CO2: 21 mmol/L — ABNORMAL LOW (ref 22–32)
Calcium: 8 mg/dL — ABNORMAL LOW (ref 8.9–10.3)
Chloride: 113 mmol/L — ABNORMAL HIGH (ref 101–111)
Creatinine, Ser: 1.01 mg/dL — ABNORMAL HIGH (ref 0.44–1.00)
GFR calc Af Amer: >60 mL/min (ref >60)
GFR calc non Af Amer: >60 mL/min (ref >60)
Glucose, Bld: 136 mg/dL — ABNORMAL HIGH (ref 65–99)
Potassium: 3.9 mmol/L (ref 3.5–5.1)
Sodium: 138 mmol/L (ref 135–145)

## 2016-01-09 LAB — TROPONIN I: Troponin I: <0.03 ng/mL (ref <0.031)

## 2016-01-09 NOTE — ED Notes (Signed)
Pt presents to ED with mid chest pain that started tonight around 2000 with shortness of breath. Pt denies similar symptoms. Also c/o frequent rectal bleeding since Sunday. Seen in this ED for the bleeding and followed up with her pcp. Was going to be sent to [facility] by her pcp for further evaluation but she states she never went.  Pt appears anxious with increased resp rate noted.

## 2016-01-10 ENCOUNTER — Encounter: Payer: Self-pay | Admitting: Orthopedic Surgery

## 2016-01-10 ENCOUNTER — Inpatient Hospital Stay
Admission: EM | Admit: 2016-01-10 | Discharge: 2016-01-14 | DRG: 356 | Disposition: A | Discharge status: Home / Self Care | Payer: Medicare Other | Attending: Internal Medicine | Admitting: Internal Medicine

## 2016-01-10 ENCOUNTER — Inpatient Hospital Stay: Payer: Medicare Other

## 2016-01-10 DIAGNOSIS — K219 Gastro-esophageal reflux disease without esophagitis: Secondary | ICD-10-CM | POA: Diagnosis present

## 2016-01-10 DIAGNOSIS — K922 Gastrointestinal hemorrhage, unspecified: Secondary | ICD-10-CM | POA: Diagnosis present

## 2016-01-10 DIAGNOSIS — B20 Human immunodeficiency virus [HIV] disease: Secondary | ICD-10-CM | POA: Diagnosis present

## 2016-01-10 DIAGNOSIS — D649 Anemia, unspecified: Secondary | ICD-10-CM

## 2016-01-10 DIAGNOSIS — Z452 Encounter for adjustment and management of vascular access device: Secondary | ICD-10-CM

## 2016-01-10 DIAGNOSIS — F329 Major depressive disorder, single episode, unspecified: Secondary | ICD-10-CM | POA: Diagnosis present

## 2016-01-10 DIAGNOSIS — K921 Melena: Secondary | ICD-10-CM | POA: Diagnosis present

## 2016-01-10 DIAGNOSIS — J45909 Unspecified asthma, uncomplicated: Secondary | ICD-10-CM | POA: Diagnosis present

## 2016-01-10 DIAGNOSIS — F17210 Nicotine dependence, cigarettes, uncomplicated: Secondary | ICD-10-CM | POA: Diagnosis present

## 2016-01-10 DIAGNOSIS — I10 Essential (primary) hypertension: Secondary | ICD-10-CM | POA: Diagnosis present

## 2016-01-10 DIAGNOSIS — Z96653 Presence of artificial knee joint, bilateral: Secondary | ICD-10-CM | POA: Diagnosis present

## 2016-01-10 DIAGNOSIS — F32A Depression, unspecified: Secondary | ICD-10-CM | POA: Diagnosis present

## 2016-01-10 DIAGNOSIS — D62 Acute posthemorrhagic anemia: Secondary | ICD-10-CM | POA: Diagnosis present

## 2016-01-10 DIAGNOSIS — Z803 Family history of malignant neoplasm of breast: Secondary | ICD-10-CM | POA: Diagnosis not present

## 2016-01-10 DIAGNOSIS — Z79899 Other long term (current) drug therapy: Secondary | ICD-10-CM | POA: Diagnosis not present

## 2016-01-10 DIAGNOSIS — R109 Unspecified abdominal pain: Secondary | ICD-10-CM

## 2016-01-10 DIAGNOSIS — K625 Hemorrhage of anus and rectum: Secondary | ICD-10-CM

## 2016-01-10 DIAGNOSIS — Z6841 Body Mass Index (BMI) 40.0 and over, adult: Secondary | ICD-10-CM | POA: Diagnosis not present

## 2016-01-10 DIAGNOSIS — J449 Chronic obstructive pulmonary disease, unspecified: Secondary | ICD-10-CM | POA: Diagnosis present

## 2016-01-10 LAB — PREPARE RBC (CROSSMATCH)

## 2016-01-10 LAB — BASIC METABOLIC PANEL
Anion gap: 3 — ABNORMAL LOW (ref 5–15)
BUN: 12 mg/dL (ref 6–20)
CO2: 21 mmol/L — ABNORMAL LOW (ref 22–32)
Calcium: 7.5 mg/dL — ABNORMAL LOW (ref 8.9–10.3)
Chloride: 115 mmol/L — ABNORMAL HIGH (ref 101–111)
Creatinine, Ser: 0.82 mg/dL (ref 0.44–1.00)
GFR calc Af Amer: >60 mL/min (ref >60)
GFR calc non Af Amer: >60 mL/min (ref >60)
Glucose, Bld: 121 mg/dL — ABNORMAL HIGH (ref 65–99)
Potassium: 4.2 mmol/L (ref 3.5–5.1)
Sodium: 139 mmol/L (ref 135–145)

## 2016-01-10 LAB — HEMOGLOBIN AND HEMATOCRIT, BLOOD
HCT: 18.3 % — ABNORMAL LOW (ref 35.0–47.0)
HCT: 19.5 % — ABNORMAL LOW (ref 35.0–47.0)
HCT: 19.9 % — ABNORMAL LOW (ref 35.0–47.0)
Hemoglobin: 6.3 g/dL — ABNORMAL LOW (ref 12.0–16.0)
Hemoglobin: 6.2 g/dL — ABNORMAL LOW (ref 12.0–16.0)
Hemoglobin: 6.6 g/dL — ABNORMAL LOW (ref 12.0–16.0)

## 2016-01-10 LAB — MRSA PCR SCREENING: MRSA by PCR: NEGATIVE

## 2016-01-10 MED ORDER — VALACYCLOVIR HCL 500 MG PO TABS
1000.0000 mg | ORAL_TABLET | Freq: Every day | ORAL | Status: DC
  Administered 2016-01-10 – 2016-01-13 (×4): 1000 mg via ORAL
  Filled 2016-01-10 (×5): qty 2

## 2016-01-10 MED ORDER — AMLODIPINE BESYLATE 10 MG PO TABS
10.0000 mg | ORAL_TABLET | Freq: Every day | ORAL | Status: DC
  Administered 2016-01-10 – 2016-01-14 (×5): 10 mg via ORAL
  Filled 2016-01-10 (×5): qty 1

## 2016-01-10 MED ORDER — SODIUM CHLORIDE 0.9 % IV SOLN
INTRAVENOUS | Status: DC
Start: 2016-01-10 — End: 2016-01-11
  Administered 2016-01-10: 19:00:00 via INTRAVENOUS

## 2016-01-10 MED ORDER — VALACYCLOVIR HCL 500 MG PO TABS: 1000.0000 mg | ORAL_TABLET | Freq: Every day | ORAL | Status: DC

## 2016-01-10 MED ORDER — ACETAMINOPHEN 325 MG PO TABS
650.0000 mg | ORAL_TABLET | Freq: Four times a day (QID) | ORAL | Status: DC | PRN
  Administered 2016-01-13: 650 mg via ORAL
  Filled 2016-01-10: qty 2

## 2016-01-10 MED ORDER — EFAVIRENZ-EMTRICITAB-TENOFOVIR 600-200-300 MG PO TABS
1.0000 | ORAL_TABLET | Freq: Every day | ORAL | Status: DC
  Administered 2016-01-10 – 2016-01-13 (×4): 1 via ORAL
  Filled 2016-01-10 (×5): qty 1

## 2016-01-10 MED ORDER — SODIUM CHLORIDE 0.9 % IV SOLN
Freq: Once | INTRAVENOUS | Status: AC
  Administered 2016-01-11: 01:00:00 via INTRAVENOUS

## 2016-01-10 MED ORDER — ONDANSETRON HCL 4 MG/2ML IJ SOLN: 4.0000 mg | Freq: Four times a day (QID) | INTRAMUSCULAR | Status: DC | PRN

## 2016-01-10 MED ORDER — HYDROMORPHONE HCL 1 MG/ML IJ SOLN
0.5000 mg | INTRAMUSCULAR | Status: DC | PRN
  Administered 2016-01-10 – 2016-01-14 (×12): 0.5 mg via INTRAVENOUS
  Filled 2016-01-10 (×11): qty 1

## 2016-01-10 MED ORDER — EFAVIRENZ-EMTRICITAB-TENOFOVIR 600-200-300 MG PO TABS: 1.0000 | ORAL_TABLET | Freq: Every day | ORAL | Status: DC

## 2016-01-10 MED ORDER — ONDANSETRON HCL 4 MG PO TABS: 4.0000 mg | ORAL_TABLET | Freq: Four times a day (QID) | ORAL | Status: DC | PRN

## 2016-01-10 MED ORDER — MOMETASONE FURO-FORMOTEROL FUM 200-5 MCG/ACT IN AERO
2.0000 | INHALATION_SPRAY | Freq: Two times a day (BID) | RESPIRATORY_TRACT | Status: DC
  Administered 2016-01-10 – 2016-01-14 (×8): 2 via RESPIRATORY_TRACT
  Filled 2016-01-10: qty 8.8

## 2016-01-10 MED ORDER — SODIUM CHLORIDE 0.9 % IV SOLN
10.0000 mL/h | Freq: Once | INTRAVENOUS | Status: AC
  Administered 2016-01-10: 10 mL/h via INTRAVENOUS

## 2016-01-10 MED ORDER — SODIUM CHLORIDE 0.9% FLUSH
3.0000 mL | Freq: Two times a day (BID) | INTRAVENOUS | Status: DC
  Administered 2016-01-10 – 2016-01-13 (×6): 3 mL via INTRAVENOUS

## 2016-01-10 MED ORDER — PANTOPRAZOLE SODIUM 40 MG IV SOLR: 40.0000 mg | Freq: Two times a day (BID) | INTRAVENOUS | Status: DC

## 2016-01-10 MED ORDER — PANTOPRAZOLE SODIUM 40 MG IV SOLR
40.0000 mg | Freq: Two times a day (BID) | INTRAVENOUS | Status: DC
Start: 2016-01-11 — End: 2016-01-11

## 2016-01-10 MED ORDER — PANTOPRAZOLE SODIUM 40 MG IV SOLR
80.0000 mg | Freq: Once | INTRAVENOUS | Status: AC
  Administered 2016-01-10: 80 mg via INTRAVENOUS
  Filled 2016-01-10: qty 80

## 2016-01-10 MED ORDER — SODIUM CHLORIDE 0.9 % IV SOLN
8.0000 mg/h | INTRAVENOUS | Status: DC
  Administered 2016-01-10 – 2016-01-11 (×4): 8 mg/h via INTRAVENOUS
  Filled 2016-01-10 (×6): qty 80

## 2016-01-10 MED ORDER — ACETAMINOPHEN 650 MG RE SUPP: 650.0000 mg | Freq: Four times a day (QID) | RECTAL | Status: DC | PRN

## 2016-01-10 MED ORDER — SODIUM CHLORIDE 0.9 % IV SOLN
INTRAVENOUS | Status: AC
  Administered 2016-01-10: 04:00:00 via INTRAVENOUS

## 2016-01-10 MED ORDER — SODIUM CHLORIDE 0.9 % IV BOLUS (SEPSIS)
1000.0000 mL | Freq: Once | INTRAVENOUS | Status: AC
  Administered 2016-01-10: 1000 mL via INTRAVENOUS

## 2016-01-10 NOTE — ED Notes (Signed)
Pt reports chest pain tonight since 2000.  Pt also reports sob.  Pt has rectal bleeding for 6 days.  Pt was seen here this week with similar sx.  Pt states she has abd pain too. Pt alert. Skin warm and dry.  Speech clear.  Family with pt.

## 2016-01-10 NOTE — ED Notes (Signed)
Pt up to bathroom with assistance.  Pt had bright red blood BM with loose stools.  Pt waiting on bed assignment.  Pt alert. Skin warm and dry.

## 2016-01-10 NOTE — Consult Note (Signed)
GI Inpatient Consult Note  Reason for Consult: GI bleeding.   Attending Requesting Consult:  History of Present Illness: Melanie Bowen is a 55 y.o. female with hx of HIV and chronic GERD, who started passing gross blood per rectum yesterday. Does take ibuprofen often. According to pt, she had a normal colonoscopy at [facility] last year. Will need to find records of this.  HIV virus undetectable. Past Medical History:  Past Medical History  Diagnosis Date  . Asthma   . Cough   . Genital herpes   . HIV (human immunodeficiency virus infection) ([place])   . Bronchitis   . GERD (gastroesophageal reflux disease)   . Eye irritation   . Paresthesia of hand, bilateral   . Blepharitis   . Allergic rhinitis   . Hypertension   . Cigarette smoker   . Headache   . Shortness of breath dyspnea     at times due to asthma  . Depression   . Arthritis     DDD  . Claustrophobia     Problem List: Patient Active Problem List   Diagnosis Date Noted  . GI bleed 01/10/2016  . GERD (gastroesophageal reflux disease) 01/10/2016  . HTN (hypertension) 01/10/2016  . Depression 01/10/2016  . HIV (human immunodeficiency virus infection) ([place]) 01/10/2016  . Lumbar pseudoarthrosis 10/31/2015  . Chronic cough   . Cough 06/19/2015  . DDD (degenerative disc disease), lumbar 12/31/2014  . Degenerative disc disease, lumbar 12/31/2014  . Asthma, chronic 11/12/2014    Past Surgical History: Past Surgical History  Procedure Laterality Date  . Knee surgery Bilateral 2003,2007    total Joint  . Hand surgery Bilateral     carpal tunnel  . Tubal ligation    . Laparoscopy for ectopic pregnancy    . Bunionectomy Bilateral     feet  . Finger fracture Right     5th finger  . Back surgery may 2016      lumbar   . Video bronchoscopy N/A 06/30/2015    Procedure: VIDEO BRONCHOSCOPY WITHOUT FLUORO;  Surgeon: Vilinda Boehringer, MD;  Location: [facility] ORS;  Service: Cardiopulmonary;  Laterality: N/A;  . Diagnostic  laparoscopy    . Back surgery    . Joint replacement Bilateral     knees    Allergies: Allergies  Allergen Reactions  . Morphine And Related Other (See Comments)    itching  . Gabapentin Rash  . Zanaflex  [Tizanidine] Rash    Home Medications: Prescriptions prior to admission  Medication Sig Dispense Refill Last Dose  . albuterol (PROVENTIL HFA;VENTOLIN HFA) 108 (90 BASE) MCG/ACT inhaler Inhale 90 mcg into the lungs every 6 (six) hours as needed for wheezing or shortness of breath. Reported on 01/10/2016   PRN  . amLODipine (NORVASC) 10 MG tablet Take 10 mg by mouth daily before breakfast.    01/09/2016 at Unknown time  . docusate sodium (COLACE) 100 MG capsule Take 2 capsules (200 mg total) by mouth 2 (two) times daily. 120 capsule 0 01/09/2016 at Unknown time  . efavirenz-emtricitabine-tenofovir (ATRIPLA) 600-200-300 MG per tablet Take 1 tablet by mouth at bedtime.   01/09/2016 at Unknown time  . fluticasone (FLONASE) 50 MCG/ACT nasal spray Place 50 g into the nose daily as needed for allergies or rhinitis.    01/09/2016 at Unknown time  . Fluticasone-Salmeterol (ADVAIR) 500-50 MCG/DOSE AEPB Inhale 500 mcg into the lungs 2 (two) times daily.    01/09/2016 at Unknown time  . hydrochlorothiazide (HYDRODIURIL) 25 MG tablet  Take 25 mg by mouth daily.   01/09/2016 at Unknown time  . ipratropium-albuterol (DUONEB) 0.5-2.5 (3) MG/3ML SOLN Inhale 3 mLs into the lungs 3 (three) times daily as needed (shortness of breath, wheezing).    PRN  . losartan (COZAAR) 50 MG tablet Take 50 mg by mouth daily.   01/09/2016 at Unknown time  . omeprazole (PRILOSEC) 40 MG capsule Take 40 mg by mouth daily before breakfast.    01/09/2016 at Unknown time  . pramoxine (PROCTOFOAM) 1 % foam Place 1 application rectally 3 (three) times daily as needed for itching. 15 g 0 01/09/2016 at Unknown time  . promethazine (PHENERGAN) 25 MG tablet Take 25 mg by mouth as needed for nausea or vomiting.    01/09/2016 at Unknown time  .  valACYclovir (VALTREX) 1000 MG tablet Take 1,000 mg by mouth at bedtime.    01/09/2016 at Unknown time  . Witch Hazel (TUCKS) 50 % PADS Apply 1 application topically every 2 (two) hours as needed. 40 each 0 01/09/2016 at Unknown time   Home medication reconciliation was completed with the patient.   Scheduled Inpatient Medications:   . amLODipine  10 mg Oral QAC breakfast  . [START ON 01/11/2016] efavirenz-emtricitabine-tenofovir  1 tablet Oral QHS  . mometasone-formoterol  2 puff Inhalation BID  . [START ON 01/11/2016] pantoprazole (PROTONIX) IV  40 mg Intravenous Q12H  . sodium chloride flush  3 mL Intravenous Q12H  . [START ON 01/11/2016] valACYclovir  1,000 mg Oral QHS    Continuous Inpatient Infusions:   . sodium chloride 75 mL/hr at 01/10/16 0425  . pantoprozole (PROTONIX) infusion 8 mg/hr (01/10/16 0526)    PRN Inpatient Medications:  acetaminophen **OR** acetaminophen, HYDROmorphone (DILAUDID) injection, ondansetron **OR** ondansetron (ZOFRAN) IV  Family History: family history includes Breast cancer in her sister.  The patient's family history is negative for inflammatory bowel disorders, GI malignancy, or solid organ transplantation.  Social History:   reports that she has been smoking Cigarettes.  She has a 7.6 pack-year smoking history. She has never used smokeless tobacco. She reports that she drinks about 3.0 oz of alcohol per week. She reports that she does not use illicit drugs. The patient denies ETOH, tobacco, or drug use.   Review of Systems: Constitutional: Weight is stable.  Eyes: No changes in vision. ENT: No oral lesions, sore throat.  GI: see HPI.  Heme/Lymph: No easy bruising.  CV: No chest pain.  GU: No hematuria.  Integumentary: No rashes.  Neuro: No headaches.  Psych: No depression/anxiety.  Endocrine: No heat/cold intolerance.  Allergic/Immunologic: No urticaria.  Resp: No cough, SOB.  Musculoskeletal: No joint swelling.    Physical  Examination: BP 116/61 mmHg  Pulse 98  Temp(Src) 98.9 F (37.2 C) (Oral)  Resp 18  Ht 5\' 6"  (1.676 m)  Wt 268 lb 3.2 oz (121.655 kg)  BMI 43.31 kg/m2  SpO2 100% Gen: NAD, alert and oriented x 4 HEENT: PEERLA, EOMI, Neck: supple, no JVD or thyromegaly Chest: CTA bilaterally, no wheezes, crackles, or other adventitious sounds CV: RRR, no m/g/c/r Abd: soft, epigastric tenderness ND, +BS in all four quadrants; no HSM, guarding, ridigity, or rebound tenderness Ext: no edema, well perfused with 2+ pulses, Skin: no rash or lesions noted Lymph: no LAD  Data: Lab Results  Component Value Date   WBC 8.7 01/09/2016   HGB 6.3* 01/09/2016   HCT 18.7* 01/09/2016   MCV 94.9 01/09/2016   PLT 274 01/09/2016    Recent Labs Lab 01/07/16  1425 01/09/16 2325  HGB 12.3 6.3*   Lab Results  Component Value Date   NA 139 01/10/2016   K 4.2 01/10/2016   CL 115* 01/10/2016   CO2 21* 01/10/2016   BUN 12 01/10/2016   CREATININE 0.82 01/10/2016   Lab Results  Component Value Date   ALT 16 01/07/2016   AST 19 01/07/2016   ALKPHOS 124 01/07/2016   BILITOT 0.4 01/07/2016   No results for input(s): APTT, INR, PTT in the last 168 hours. Assessment/Plan: Ms. Charon is a 55 y.o. female with likely UGI bleeding. Recommendations: Daily PPI. Transfuse to bring Hgb up. Plan EGD tomorrow AM since anesthesia not available most of the day.  Thank you for the consult. Please call with questions or concerns.  Kiya Eno, Lupita Dawn, MD

## 2016-01-10 NOTE — Progress Notes (Signed)
Blood not yet ready for transfusion. Blood bank to call when ready.

## 2016-01-10 NOTE — Progress Notes (Signed)
Patient ID: MANESSA WITBECK, female   DOB: Jan 28, 1961, 55 y.o.   MRN: [id number] [facility] NAME: Melanie Bowen    MR#:  [id number]  DATE OF BIRTH:  Dec 13, 1960  SUBJECTIVE:  Frustrated with multiple sticks for IV access. Came in with weakness and found to have hgb of 6.3  REVIEW OF SYSTEMS:   Review of Systems  Constitutional: Negative for fever, chills and weight loss.  HENT: Negative for ear discharge, ear pain and nosebleeds.   Eyes: Negative for blurred vision, pain and discharge.  Respiratory: Negative for sputum production, shortness of breath, wheezing and stridor.   Cardiovascular: Negative for chest pain, palpitations, orthopnea and PND.  Gastrointestinal: Positive for abdominal pain, blood in stool and melena. Negative for nausea, vomiting and diarrhea.  Genitourinary: Negative for urgency and frequency.  Musculoskeletal: Negative for back pain and joint pain.  Neurological: Positive for weakness. Negative for sensory change, speech change and focal weakness.  Psychiatric/Behavioral: Negative for depression and hallucinations. The patient is not nervous/anxious.   All other systems reviewed and are negative.  Tolerating Diet:clear Tolerating PT: not needed  DRUG ALLERGIES:   Allergies  Allergen Reactions  . Morphine And Related Other (See Comments)    itching  . Gabapentin Rash  . Zanaflex  [Tizanidine] Rash    VITALS:  Blood pressure 115/69, pulse 98, temperature 97.8 F (36.6 C), temperature source Oral, resp. rate 18, height 5\' 6"  (1.676 m), weight 121.655 kg (268 lb 3.2 oz), SpO2 100 %.  PHYSICAL EXAMINATION:   Physical Exam  GENERAL:  55 y.o.-year-old patient lying in the bed with no acute distress. obese EYES: Pupils equal, round, reactive to light and accommodation. No scleral icterus. Extraocular muscles intact. Pallor+ HEENT: Head atraumatic, normocephalic. Oropharynx and nasopharynx clear.   NECK:  Supple, no jugular venous distention. No thyroid enlargement, no tenderness.  LUNGS: Normal breath sounds bilaterally, no wheezing, rales, rhonchi. No use of accessory muscles of respiration.  CARDIOVASCULAR: S1, S2 normal. No murmurs, rubs, or gallops.  ABDOMEN: Soft, nontender, nondistended. Bowel sounds present. No organomegaly or mass.  EXTREMITIES: No cyanosis, clubbing or edema b/l.    NEUROLOGIC: Cranial nerves II through XII are intact. No focal Motor or sensory deficits b/l.   PSYCHIATRIC:  patient is alert and oriented x 3.  SKIN: No obvious rash, lesion, or ulcer.   LABORATORY PANEL:  CBC  Recent Labs Lab 01/09/16 2325  WBC 8.7  HGB 6.3*  HCT 18.7*  PLT 274    Chemistries   Recent Labs Lab 01/07/16 1425  01/10/16 0629  NA 141  < > 139  K 4.0  < > 4.2  CL 112*  < > 115*  CO2 21*  < > 21*  GLUCOSE 114*  < > 121*  BUN 25*  < > 12  CREATININE 1.48*  < > 0.82  CALCIUM 8.6*  < > 7.5*  AST 19  --   --   ALT 16  --   --   ALKPHOS 124  --   --   BILITOT 0.4  --   --   < > = values in this interval not displayed. Cardiac Enzymes  Recent Labs Lab 01/09/16 2325  TROPONINI <0.03   RADIOLOGY:  Dg Chest 2 View  01/09/2016  CLINICAL DATA:  Chest pain and dyspnea, onset this evening. EXAM: CHEST  2 VIEW COMPARISON:  07/28/2015 FINDINGS: The lungs are clear. The pulmonary vasculature is  normal. Heart size is normal. Hilar and mediastinal contours are unremarkable. There is no pleural effusion. IMPRESSION: No active cardiopulmonary disease. Electronically Signed   By: Andreas Newport M.D.   On: 01/09/2016 23:59   Dg Chest Port 1 View  01/10/2016  CLINICAL DATA:  New right-sided PICC line. EXAM: PORTABLE CHEST 1 VIEW COMPARISON:  01/09/2016. FINDINGS: Right-sided PICC line is present with tip overlying the SVC lungs well inflated without consolidation or effusion. Cardiomediastinal silhouette is within normal. Catheter partially visualized over overlying the  epigastric region. IMPRESSION: No acute cardiopulmonary disease. Right-sided PICC line with tip overlying the SVC. Electronically Signed   By: Marin Olp M.D.   On: 01/10/2016 13:56   ASSESSMENT AND PLAN:   Melanie Bowen is a 55 y.o. female who presents with Abdominal pain and melena. Patient was seen here in the ED a couple of days ago with a complaint of GI bleed. Her hemoglobin was stable at that time. It was felt that the spleen was potentially due to hemorrhoids, and she was sent home. She returns today with a feeling of shortness of breath, fatigue, persistent abdominal pain, and recurrent bloody stool. Hemoglobin here today is 6, it was 12 two days ago.  1.GI bleed - unclear of her upper or lower, though her history of recent surgery, emotional stress, and some ibuprofen use might seem to indicate upper GI process.  -on protonix drip, fluids to support her blood pressure, keep her nothing by mouth - GI consult with Dr Candace Cruise appreciated. for endoscopy in am\  2. HTN (hypertension) - currently stable, though normotensive so hold home antihypertensives for now.  3. HIV (human immunodeficiency virus infection) ([place]) - continue home dose HAART therapy  4.GERD (gastroesophageal reflux disease) - PPI drip as above  5. Depression - continue home meds  6. Difficult IV access will get pICC or IJ. Pt agreeable  Case discussed with Care Management/Social Worker. Management plans discussed with the patient, family and they are in agreement.  CODE STATUS: full  DVT Prophylaxis: scd  TOTAL TIME TAKING CARE OF THIS PATIENT: 30 minutes.  >50% time spent on counselling and coordination of care  POSSIBLE D/C IN  2-3 DAYS, DEPENDING ON CLINICAL CONDITION.  Note: This dictation was prepared with Dragon dictation along with smaller phrase technology. Any transcriptional errors that result from this process are unintentional.  Khyri Hinzman M.D on 01/10/2016 at 3:26 PM  Between 7am to 6pm - Pager -  [phone]  After 6pm go to [email or web address] - password [email or web address] [facility]  Office  [phone]  CC: Primary care physician; [facility]

## 2016-01-10 NOTE — Progress Notes (Signed)
Blood bank called to see if blood has is ready. Blood not yet ready. Blood bank working on machinery to crossmatch unit.

## 2016-01-10 NOTE — Progress Notes (Signed)
Spoke with Dr. Estanislado Pandy, pt not able to receive blood at this time because of mechanical issues with machinery. Pt has cbc ordered this morning. May cancel cbc and reorder when blood has been transfused.

## 2016-01-10 NOTE — ED Provider Notes (Signed)
[facility] Emergency Department Provider Note  ____________________________________________  Time seen: 12:40 AM  I have reviewed the triage vital signs and the nursing notes.   HISTORY  Chief Complaint Chest Pain and Shortness of Breath    HPI Melanie Bowen is a 56 y.o. female who complains of rectal bleeding for the past 3 days. This is been continuous despite being seen here 2 days ago and being treated symptomatically for a inflamed tender hemorrhoids. Now today she is noticing that she gets lightheaded and short of breath whenever she standing up or walking around or exerting herself in anyway. No specific exertional chest pain. No syncope or falls or other injuries.     Past Medical History  Diagnosis Date  . Asthma   . Cough   . Genital herpes   . HIV (human immunodeficiency virus infection) ([place])   . Bronchitis   . GERD (gastroesophageal reflux disease)   . Eye irritation   . Paresthesia of hand, bilateral   . Blepharitis   . Allergic rhinitis   . Hypertension   . Cigarette smoker   . Headache   . Shortness of breath dyspnea     at times due to asthma  . Depression   . Arthritis     DDD  . Claustrophobia      Patient Active Problem List   Diagnosis Date Noted  . GI bleed 01/10/2016  . GERD (gastroesophageal reflux disease) 01/10/2016  . HTN (hypertension) 01/10/2016  . Depression 01/10/2016  . HIV (human immunodeficiency virus infection) ([place]) 01/10/2016  . Lumbar pseudoarthrosis 10/31/2015  . Chronic cough   . Cough 06/19/2015  . DDD (degenerative disc disease), lumbar 12/31/2014  . Degenerative disc disease, lumbar 12/31/2014  . Asthma, chronic 11/12/2014     Past Surgical History  Procedure Laterality Date  . Knee surgery Bilateral 2003,2007    total Joint  . Hand surgery Bilateral     carpal tunnel  . Tubal ligation    . Laparoscopy for ectopic pregnancy    . Bunionectomy Bilateral     feet  . Finger  fracture Right     5th finger  . Back surgery may 2016      lumbar   . Video bronchoscopy N/A 06/30/2015    Procedure: VIDEO BRONCHOSCOPY WITHOUT FLUORO;  Surgeon: Vilinda Boehringer, MD;  Location: [facility] ORS;  Service: Cardiopulmonary;  Laterality: N/A;  . Diagnostic laparoscopy    . Back surgery    . Joint replacement Bilateral     knees     Current Outpatient Rx  Name  Route  Sig  Dispense  Refill  . albuterol (PROVENTIL HFA;VENTOLIN HFA) 108 (90 BASE) MCG/ACT inhaler   Inhalation   Inhale 90 mcg into the lungs every 6 (six) hours as needed for wheezing or shortness of breath. Reported on 01/10/2016         . amLODipine (NORVASC) 10 MG tablet   Oral   Take 10 mg by mouth daily before breakfast.          . docusate sodium (COLACE) 100 MG capsule   Oral   Take 2 capsules (200 mg total) by mouth 2 (two) times daily.   120 capsule   0   . efavirenz-emtricitabine-tenofovir (ATRIPLA) 600-200-300 MG per tablet   Oral   Take 1 tablet by mouth at bedtime.         . fluticasone (FLONASE) 50 MCG/ACT nasal spray   Nasal   Place 50 g  into the nose daily as needed for allergies or rhinitis.          . Fluticasone-Salmeterol (ADVAIR) 500-50 MCG/DOSE AEPB   Inhalation   Inhale 500 mcg into the lungs 2 (two) times daily.          . hydrochlorothiazide (HYDRODIURIL) 25 MG tablet   Oral   Take 25 mg by mouth daily.         Marland Kitchen ipratropium-albuterol (DUONEB) 0.5-2.5 (3) MG/3ML SOLN   Inhalation   Inhale 3 mLs into the lungs 3 (three) times daily as needed (shortness of breath, wheezing).          Marland Kitchen losartan (COZAAR) 50 MG tablet   Oral   Take 50 mg by mouth daily.         Marland Kitchen omeprazole (PRILOSEC) 40 MG capsule   Oral   Take 40 mg by mouth daily before breakfast.          . pramoxine (PROCTOFOAM) 1 % foam   Rectal   Place 1 application rectally 3 (three) times daily as needed for itching.   15 g   0   . promethazine (PHENERGAN) 25 MG tablet   Oral   Take 25 mg  by mouth as needed for nausea or vomiting.          . valACYclovir (VALTREX) 1000 MG tablet   Oral   Take 1,000 mg by mouth at bedtime.          [name] 50 % PADS   Apply externally   Apply 1 application topically every 2 (two) hours as needed.   40 each   0      Allergies Morphine and related; Gabapentin; and Zanaflex    Family History  Problem Relation Age of Onset  . Breast cancer Sister     Social History Social History  Substance Use Topics  . Smoking status: Light Tobacco Smoker -- 0.20 packs/day for 38 years    Types: Cigarettes  . Smokeless tobacco: Never Used  . Alcohol Use: 3.0 oz/week    5 Glasses of wine, 0 Standard drinks or equivalent per week     Comment: wine - couple days of week    Review of Systems  Constitutional:   No fever or chills.  Eyes:   No vision changes.  ENT:   No sore throat. No rhinorrhea. Cardiovascular:   No chest pain. Respiratory:   No dyspnea or cough. Gastrointestinal:   Negative for abdominal pain, vomiting and diarrhea. Positive bloody stool Genitourinary:   Negative for dysuria or difficulty urinating. Musculoskeletal:   Negative for focal pain or swelling Neurological:   Negative for headaches. Positive dizziness on standing 10-point ROS otherwise negative.  ____________________________________________   PHYSICAL EXAM:  VITAL SIGNS: ED Triage Vitals  Enc Vitals Group     BP 01/09/16 2319 125/65 mmHg     Pulse Rate 01/09/16 2319 111     Resp 01/09/16 2319 24     Temp 01/09/16 2319 97.8 F (36.6 C)     Temp Source 01/09/16 2319 Oral     SpO2 01/09/16 2319 100 %     Weight 01/09/16 2319 267 lb (121.11 kg)     Height 01/09/16 2319 5\' 6"  (1.676 m)     Head Cir --      Peak Flow --      Pain Score 01/10/16 0108 10     Pain Loc --      Pain Edu? --  Excl. in [place]? --     Vital signs reviewed, nursing assessments reviewed.   Constitutional:   Alert and oriented. Well appearing and in no  distress. Eyes:   No scleral icterus. No conjunctival pallor. PERRL. EOMI.  No nystagmus. ENT   Head:   Normocephalic and atraumatic.   Nose:   No congestion/rhinnorhea. No septal hematoma   Mouth/Throat:   MMM, no pharyngeal erythema. No peritonsillar mass.    Neck:   No stridor. No SubQ emphysema. No meningismus. Hematological/Lymphatic/Immunilogical:   No cervical lymphadenopathy. Cardiovascular:   Tachycardia heart rate 110. Symmetric bilateral radial and DP pulses.  No murmurs.  Respiratory:   Normal respiratory effort without tachypnea nor retractions. Breath sounds are clear and equal bilaterally. No wheezes/rales/rhonchi. Gastrointestinal:   Soft and nontender. Non distended. There is no CVA tenderness.  No rebound, rigidity, or guarding. Grossly bloody stool on rectal exam Genitourinary:   deferred Musculoskeletal:   Nontender with normal range of motion in all extremities. No joint effusions.  No lower extremity tenderness.  No edema. Neurologic:   Normal speech and language.  CN 2-10 normal. Motor grossly intact. No gross focal neurologic deficits are appreciated.  Skin:    Skin is warm, dry and intact. No rash noted.  No petechiae, purpura, or bullae.  ____________________________________________    LABS (pertinent positives/negatives) (all labs ordered are listed, but only abnormal results are displayed) Labs Reviewed  BASIC METABOLIC PANEL - Abnormal; Notable for the following:    Chloride 113 (*)    CO2 21 (*)    Glucose, Bld 136 (*)    Creatinine, Ser 1.01 (*)    Calcium 8.0 (*)    Anion gap 4 (*)    All other components within normal limits  CBC - Abnormal; Notable for the following:    RBC 1.98 (*)    Hemoglobin 6.3 (*)    HCT 18.7 (*)    RDW 14.7 (*)    All other components within normal limits  TROPONIN I  TYPE AND SCREEN  PREPARE RBC (CROSSMATCH)   ____________________________________________   EKG  Interpreted by me Sinus tachycardia  rate 112, normal axis intervals QRS ST segments and T waves  ____________________________________________    RADIOLOGY  Chest x-ray unremarkable  ____________________________________________   PROCEDURES CRITICAL CARE Performed by: Joni Fears, Zelphia Glover   Total critical care time: 35 minutes  Critical care time was exclusive of separately billable procedures and treating other patients.  Critical care was necessary to treat or prevent imminent or life-threatening deterioration.  Critical care was time spent personally by me on the following activities: development of treatment plan with patient and/or surrogate as well as nursing, discussions with consultants, evaluation of patient's response to treatment, examination of patient, obtaining history from patient or surrogate, ordering and performing treatments and interventions, ordering and review of laboratory studies, ordering and review of radiographic studies, pulse oximetry and re-evaluation of patient's condition.   ____________________________________________   INITIAL IMPRESSION / ASSESSMENT AND PLAN / ED COURSE  Pertinent labs & imaging results that were available during my care of the patient were reviewed by me and considered in my medical decision making (see chart for details). Patient presents with rectal bleeding for the past 3 days, tachycardic.. Labs indicate that hemoglobin has gone from 12-6 in just 2 days. Persistent GI bleeding. At this point we will need to hospitalize her for transfusion to address her symptomatic anemia as well as further GI evaluation to localize and controlled the bleeding source.  She is not on any anticoagulants. I have obtained verbal consent from the patient after discussing the risks and benefits of transfusion. I will give a liter saline bolus as well for her tachycardia. Case discussed with hospitalist for admission.   ____________________________________________   FINAL CLINICAL  IMPRESSION(S) / ED DIAGNOSES  Final diagnoses:  Lower GI bleed  Symptomatic anemia       Portions of this note were generated with dragon dictation software. Dictation errors may occur despite best attempts at proofreading.   Carrie Mew, MD 01/10/16 [phone]

## 2016-01-10 NOTE — H&P (Signed)
[place] [place] NAME: Melanie Bowen    MR#:  [id number]  DATE OF BIRTH:  July 29, 1961  DATE OF ADMISSION:  01/10/2016  PRIMARY CARE PHYSICIAN: [facility]   REQUESTING/REFERRING PHYSICIAN: Joni Fears, MD  CHIEF COMPLAINT:   Chief Complaint  Patient presents with  . Chest Pain  . Shortness of Breath    HISTORY OF PRESENT ILLNESS:  Melanie Bowen  is a 55 y.o. female who presents with Abdominal pain and melena. Patient was seen here in the ED a couple of days ago with a complaint of GI bleed. Her hemoglobin was stable at that time. It was felt that the spleen was potentially due to hemorrhoids, and she was sent home. She returns today with a feeling of shortness of breath, fatigue, persistent abdominal pain, and recurrent bloody stool. Hemoglobin here today is 6, it was 12 two days ago. PRBC transfusion ordered in the ED and hospitalists were called for admission.  PAST MEDICAL HISTORY:   Past Medical History  Diagnosis Date  . Asthma   . Cough   . Genital herpes   . HIV (human immunodeficiency virus infection) ([place])   . Bronchitis   . GERD (gastroesophageal reflux disease)   . Eye irritation   . Paresthesia of hand, bilateral   . Blepharitis   . Allergic rhinitis   . Hypertension   . Cigarette smoker   . Headache   . Shortness of breath dyspnea     at times due to asthma  . Depression   . Arthritis     DDD  . Claustrophobia     PAST SURGICAL HISTORY:   Past Surgical History  Procedure Laterality Date  . Knee surgery Bilateral 2003,2007    total Joint  . Hand surgery Bilateral     carpal tunnel  . Tubal ligation    . Laparoscopy for ectopic pregnancy    . Bunionectomy Bilateral     feet  . Finger fracture Right     5th finger  . Back surgery may 2016      lumbar   . Video bronchoscopy N/A 06/30/2015    Procedure: VIDEO BRONCHOSCOPY WITHOUT FLUORO;  Surgeon: Vilinda Boehringer, MD;   Location: [facility] ORS;  Service: Cardiopulmonary;  Laterality: N/A;  . Diagnostic laparoscopy    . Back surgery    . Joint replacement Bilateral     knees    SOCIAL HISTORY:   Social History  Substance Use Topics  . Smoking status: Light Tobacco Smoker -- 0.20 packs/day for 38 years    Types: Cigarettes  . Smokeless tobacco: Never Used  . Alcohol Use: 3.0 oz/week    5 Glasses of wine, 0 Standard drinks or equivalent per week     Comment: wine - couple days of week    FAMILY HISTORY:   Family History  Problem Relation Age of Onset  . Breast cancer Sister     DRUG ALLERGIES:   Allergies  Allergen Reactions  . Morphine And Related   . Gabapentin Rash  . Zanaflex  [Tizanidine] Rash    MEDICATIONS AT HOME:   Prior to Admission medications   Medication Sig Start Date End Date Taking? Authorizing Provider  albuterol (PROVENTIL HFA;VENTOLIN HFA) 108 (90 BASE) MCG/ACT inhaler Inhale 90 mcg into the lungs every 6 (six) hours as needed for wheezing or shortness of breath.  12/06/12   Historical Provider, MD  amLODipine (NORVASC) 10 MG tablet Take 10  mg by mouth daily before breakfast.  09/27/12   Historical Provider, MD  diazepam (VALIUM) 5 MG tablet Take 1-2 tablets (5-10 mg total) by mouth every 6 (six) hours as needed for muscle spasms. 10/31/15   Earnie Larsson, MD  docusate sodium (COLACE) 100 MG capsule Take 2 capsules (200 mg total) by mouth 2 (two) times daily. 01/07/16   Carrie Mew, MD  efavirenz-emtricitabine-tenofovir (ATRIPLA) 600-200-300 MG per tablet Take 1 tablet by mouth at bedtime. 08/09/14   Historical Provider, MD  fluticasone (FLONASE) 50 MCG/ACT nasal spray Place 50 g into the nose daily as needed for allergies or rhinitis.  10/06/12   Historical Provider, MD  Fluticasone-Salmeterol (ADVAIR) 500-50 MCG/DOSE AEPB Inhale 500 mcg into the lungs 2 (two) times daily.  06/05/14   Historical Provider, MD  hydrochlorothiazide (HYDRODIURIL) 25 MG tablet Take 25 mg by mouth daily.  02/21/12   Historical Provider, MD  ipratropium-albuterol (DUONEB) 0.5-2.5 (3) MG/3ML SOLN Inhale 3 mLs into the lungs 3 (three) times daily as needed (shortness of breath, wheezing).  06/26/14   Historical Provider, MD  losartan (COZAAR) 50 MG tablet Take 50 mg by mouth daily. 12/06/12   Historical Provider, MD  nicotine (NICOTROL) 10 MG inhaler Inhale 1 cartridge (1 continuous puffing total) into the lungs as needed for smoking cessation. 06/19/15   Vilinda Boehringer, MD  omeprazole (PRILOSEC) 40 MG capsule Take 40 mg by mouth daily before breakfast.  05/28/14   Historical Provider, MD  oxyCODONE (ROXICODONE) 15 MG immediate release tablet Take 1 tablet (15 mg total) by mouth every 6 (six) hours as needed for pain. 10/31/15   Earnie Larsson, MD  pramoxine (PROCTOFOAM) 1 % foam Place 1 application rectally 3 (three) times daily as needed for itching. 01/07/16   Carrie Mew, MD  promethazine (PHENERGAN) 25 MG tablet Take 25 mg by mouth as needed for nausea or vomiting.  02/05/13   Historical Provider, MD  valACYclovir (VALTREX) 1000 MG tablet Take 1,000 mg by mouth at bedtime.  06/05/14   Historical Provider, MD  Witch Hazel (TUCKS) 50 % PADS Apply 1 application topically every 2 (two) hours as needed. 01/07/16   Carrie Mew, MD    REVIEW OF SYSTEMS:  Review of Systems  Constitutional: Positive for malaise/fatigue. Negative for fever, chills and weight loss.  HENT: Negative for ear pain, hearing loss and tinnitus.   Eyes: Negative for blurred vision, double vision, pain and redness.  Respiratory: Positive for shortness of breath. Negative for cough and hemoptysis.   Cardiovascular: Negative for chest pain, palpitations, orthopnea and leg swelling.  Gastrointestinal: Positive for abdominal pain and melena. Negative for nausea, vomiting, diarrhea and constipation.  Genitourinary: Negative for dysuria, frequency and hematuria.  Musculoskeletal: Negative for back pain, joint pain and neck pain.  Skin:        No acne, rash, or lesions  Neurological: Negative for dizziness, tremors, focal weakness and weakness.  Endo/Heme/Allergies: Negative for polydipsia. Does not bruise/bleed easily.  Psychiatric/Behavioral: Negative for depression. The patient is not nervous/anxious and does not have insomnia.      VITAL SIGNS:   Filed Vitals:   01/09/16 2319 01/10/16 0110 01/10/16 0121  BP: 125/65 115/89   Pulse: 111  102  Temp: 97.8 F (36.6 C)    TempSrc: Oral    Resp: 24  19  Height: 5\' 6"  (1.676 m)    Weight: 121.11 kg (267 lb)    SpO2: 100%  100%   Wt Readings from Last 3 Encounters:  01/09/16 121.11 kg (267 lb)  01/07/16 121.11 kg (267 lb)  10/31/15 122.471 kg (270 lb)    PHYSICAL EXAMINATION:  Physical Exam  Vitals reviewed. Constitutional: She is oriented to person, place, and time. She appears well-developed and well-nourished. No distress.  HENT:  Head: Normocephalic and atraumatic.  Mouth/Throat: Oropharynx is clear and moist.  Eyes: Conjunctivae and EOM are normal. Pupils are equal, round, and reactive to light. No scleral icterus.  Neck: Normal range of motion. Neck supple. No JVD present. No thyromegaly present.  Cardiovascular: Regular rhythm and intact distal pulses.  Exam reveals no gallop and no friction rub.   No murmur heard. Tachycardic  Respiratory: Effort normal and breath sounds normal. No respiratory distress. She has no wheezes. She has no rales.  GI: Soft. Bowel sounds are normal. She exhibits no distension. There is no tenderness.  Musculoskeletal: Normal range of motion. She exhibits no edema.  No arthritis, no gout  Lymphadenopathy:    She has no cervical adenopathy.  Neurological: She is alert and oriented to person, place, and time. No cranial nerve deficit.  No dysarthria, no aphasia  Skin: Skin is warm and dry. No rash noted. No erythema.  Psychiatric: She has a normal mood and affect. Her behavior is normal. Judgment and thought content normal.     LABORATORY PANEL:   CBC  Recent Labs Lab 01/09/16 2325  WBC 8.7  HGB 6.3*  HCT 18.7*  PLT 274   ------------------------------------------------------------------------------------------------------------------  Chemistries   Recent Labs Lab 01/07/16 1425 01/09/16 2325  NA 141 138  K 4.0 3.9  CL 112* 113*  CO2 21* 21*  GLUCOSE 114* 136*  BUN 25* 13  CREATININE 1.48* 1.01*  CALCIUM 8.6* 8.0*  AST 19  --   ALT 16  --   ALKPHOS 124  --   BILITOT 0.4  --    ------------------------------------------------------------------------------------------------------------------  Cardiac Enzymes  Recent Labs Lab 01/09/16 2325  TROPONINI <0.03   ------------------------------------------------------------------------------------------------------------------  RADIOLOGY:  Dg Chest 2 View  01/09/2016  CLINICAL DATA:  Chest pain and dyspnea, onset this evening. EXAM: CHEST  2 VIEW COMPARISON:  07/28/2015 FINDINGS: The lungs are clear. The pulmonary vasculature is normal. Heart size is normal. Hilar and mediastinal contours are unremarkable. There is no pleural effusion. IMPRESSION: No active cardiopulmonary disease. Electronically Signed   By: Andreas Newport M.D.   On: 01/09/2016 23:59    EKG:   Orders placed or performed during the hospital encounter of 01/10/16  . EKG 12-Lead  . EKG 12-Lead  . ED EKG within 10 minutes  . ED EKG within 10 minutes    IMPRESSION AND PLAN:  Principal Problem:   GI bleed - unclear of her upper or lower, though her history of recent surgery, emotional stress, and some ibuprofen use might seem to indicate upper GI process. We will put her on protonix drip, fluids to support her blood pressure, keep her nothing by mouth, and order a GI consult for likely endoscopy. Active Problems:   HTN (hypertension) - currently stable, though normotensive so hold home antihypertensives for now.   HIV (human immunodeficiency virus infection) ([place]) -  continue home dose HAART therapy   GERD (gastroesophageal reflux disease) - PPI drip as above   Depression - continue home meds  All the records are reviewed and case discussed with ED provider. Management plans discussed with the patient and/or family.  DVT PROPHYLAXIS: Mechanical only  GI PROPHYLAXIS: PPI  ADMISSION STATUS: Inpatient  CODE STATUS: Full  Code Status History    Date Active Date Inactive Code Status Order ID Comments User Context   12/31/2014  1:45 PM 01/02/2015 12:59 PM Full Code [id number]  Earnie Larsson, MD Inpatient      TOTAL TIME TAKING CARE OF THIS PATIENT: 45 minutes.    Oluwatobi Ruppe Chamberlain 01/10/2016, 1:35 AM  [facility]  Office  [phone]  CC: Primary care physician; [facility]

## 2016-01-10 NOTE — Progress Notes (Signed)
Pts HGB 6.3 after 2 units of blood. MD Posey Pronto notified. Orders received to transfuse 1 unit of blood and check H&H one hr after blood is complete. Orders placed.

## 2016-01-10 NOTE — Progress Notes (Signed)
MD notified of pt h/h after 3rd transfusion of 6.6. MD to put new orders in

## 2016-01-11 ENCOUNTER — Encounter
Admission: EM | Disposition: A | Discharge status: Home / Self Care | Payer: Self-pay | Source: Home / Self Care | Attending: Internal Medicine

## 2016-01-11 ENCOUNTER — Inpatient Hospital Stay: Payer: Medicare Other | Admitting: Anesthesiology

## 2016-01-11 HISTORY — PX: ESOPHAGOGASTRODUODENOSCOPY: SHX5428

## 2016-01-11 LAB — LACTATE DEHYDROGENASE: LDH: 98 U/L (ref 98–192)

## 2016-01-11 LAB — HEMOGLOBIN AND HEMATOCRIT, BLOOD
HCT: 21.3 % — ABNORMAL LOW (ref 35.0–47.0)
HCT: 21.1 % — ABNORMAL LOW (ref 35.0–47.0)
HCT: 21.7 % — ABNORMAL LOW (ref 35.0–47.0)
Hemoglobin: 7 g/dL — ABNORMAL LOW (ref 12.0–16.0)
Hemoglobin: 7.1 g/dL — ABNORMAL LOW (ref 12.0–16.0)
Hemoglobin: 7.3 g/dL — ABNORMAL LOW (ref 12.0–16.0)

## 2016-01-11 LAB — PREPARE RBC (CROSSMATCH)

## 2016-01-11 SURGERY — EGD (ESOPHAGOGASTRODUODENOSCOPY)
Anesthesia: General | Laterality: Left

## 2016-01-11 MED ORDER — PEG 3350-KCL-NA BICARB-NACL 420 G PO SOLR
4000.0000 mL | Freq: Once | ORAL | Status: AC
  Administered 2016-01-11: 4000 mL via ORAL
  Filled 2016-01-11: qty 4000

## 2016-01-11 MED ORDER — SODIUM CHLORIDE 0.9 % IV SOLN: Freq: Once | INTRAVENOUS | Status: AC

## 2016-01-11 MED ORDER — PANTOPRAZOLE SODIUM 40 MG IV SOLR: 40.0000 mg | Freq: Two times a day (BID) | INTRAVENOUS | Status: DC

## 2016-01-11 MED ORDER — PROPOFOL 10 MG/ML IV BOLUS
INTRAVENOUS | Status: DC | PRN
  Administered 2016-01-11: 80 mg via INTRAVENOUS

## 2016-01-11 MED ORDER — SODIUM CHLORIDE 0.9 % IV SOLN: INTRAVENOUS | Status: DC

## 2016-01-11 MED ORDER — FENTANYL CITRATE (PF) 100 MCG/2ML IJ SOLN
INTRAMUSCULAR | Status: DC | PRN
  Administered 2016-01-11: 50 ug via INTRAVENOUS

## 2016-01-11 NOTE — Op Note (Signed)
[facility] Patient Name: Melanie Bowen Procedure Date: 01/11/2016 8:20 AM MRN: [id number] Account #: [id number] Date of Birth: [birth year] Admit Type: Inpatient Age: 55 Room: [facility] ENDO ROOM 4 Gender: Female Note Status: Finalized Procedure:            Upper GI endoscopy Indications:          Acute post hemorrhagic anemia, Hematochezia Providers:            Lupita Dawn. Candace Cruise, MD Medicines:            Monitored Anesthesia Care Complications:        No immediate complications. Procedure:            Pre-Anesthesia Assessment:                       - Prior to the procedure, a History and Physical was                        performed, and patient medications, allergies and                        sensitivities were reviewed. The patient's tolerance of                        previous anesthesia was reviewed.                       - The risks and benefits of the procedure and the                        sedation options and risks were discussed with the                        patient. All questions were answered and informed                        consent was obtained.                       - After reviewing the risks and benefits, the patient                        was deemed in satisfactory condition to undergo the                        procedure.                       After obtaining informed consent, the endoscope was                        passed under direct vision. Throughout the procedure,                        the patient's blood pressure, pulse, and oxygen                        saturations were monitored continuously. The Endoscope                        was introduced through the mouth, and advanced to the  second part of duodenum. The upper GI endoscopy was                        accomplished without difficulty. The patient tolerated                        the procedure well. Findings:      The examined esophagus was  normal.      The entire examined stomach was normal.      The examined duodenum was normal. Impression:           - Normal esophagus.                       - Normal stomach.                       - Normal examined duodenum.                       - No specimens collected. Recommendation:       - Observe patient's clinical course.                       - The findings and recommendations were discussed with                        the patient.                       - Will need colonoscopy Procedure Code(s):    --- Professional ---                       [phone], Esophagogastroduodenoscopy, flexible, transoral;                        diagnostic, including collection of specimen(s) by                        brushing or washing, when performed (separate procedure) Diagnosis Code(s):    --- Professional ---                       D62, Acute posthemorrhagic anemia                       K92.1, Melena (includes Hematochezia) CPT copyright 2016 American Medical Association. All rights reserved. The codes documented in this report are preliminary and upon coder review may  be revised to meet current compliance requirements. Hulen Luster, MD 01/11/2016 8:41:09 AM This report has been signed electronically. Number of Addenda: 0 Note Initiated On: 01/11/2016 8:20 AM      [facility]

## 2016-01-11 NOTE — Progress Notes (Signed)
Pt reports she has been taking an allergy medication not listed on her PTA home meds. (Cannot remember the name, but it is a prescription medication, possibly Allegra?) Pt will find out and update staff. Pt concerned this may have attributed to her GI bleed. Plan to discuss with MD at rounds in AM. Pt requested that a note be made in her chart regarding this.

## 2016-01-11 NOTE — Progress Notes (Signed)
Patient ID: Melanie Bowen, female   DOB: 1961/02/28, 55 y.o.   MRN: [id number] [place] [place] NAME: Melanie Bowen    MR#:  [id number]  DATE OF BIRTH:  1960-11-15  SUBJECTIVE:  Continues with dark red blood in stools 2-3 y'day Came in with weakness and found to have hgb of 6.3  REVIEW OF SYSTEMS:   Review of Systems  Constitutional: Negative for fever, chills and weight loss.  HENT: Negative for ear discharge, ear pain and nosebleeds.   Eyes: Negative for blurred vision, pain and discharge.  Respiratory: Negative for sputum production, shortness of breath, wheezing and stridor.   Cardiovascular: Negative for chest pain, palpitations, orthopnea and PND.  Gastrointestinal: Positive for abdominal pain, blood in stool and melena. Negative for nausea, vomiting and diarrhea.  Genitourinary: Negative for urgency and frequency.  Musculoskeletal: Negative for back pain and joint pain.  Neurological: Positive for weakness. Negative for sensory change, speech change and focal weakness.  Psychiatric/Behavioral: Negative for depression and hallucinations. The patient is not nervous/anxious.   All other systems reviewed and are negative.  Tolerating Diet; npo Tolerating PT: not needed  DRUG ALLERGIES:   Allergies  Allergen Reactions  . Morphine And Related Other (See Comments)    itching  . Gabapentin Rash  . Zanaflex  [Tizanidine] Rash    VITALS:  Blood pressure 115/61, pulse 92, temperature 98.4 F (36.9 C), temperature source Oral, resp. rate 16, height 5\' 6"  (1.676 m), weight 121.655 kg (268 lb 3.2 oz), SpO2 100 %.  PHYSICAL EXAMINATION:   Physical Exam  GENERAL:  55 y.o.-year-old patient lying in the bed with no acute distress. obese EYES: Pupils equal, round, reactive to light and accommodation. No scleral icterus. Extraocular muscles intact. Pallor+ HEENT: Head atraumatic, normocephalic. Oropharynx and nasopharynx  clear.  NECK:  Supple, no jugular venous distention. No thyroid enlargement, no tenderness.  LUNGS: Normal breath sounds bilaterally, no wheezing, rales, rhonchi. No use of accessory muscles of respiration.  CARDIOVASCULAR: S1, S2 normal. No murmurs, rubs, or gallops.  ABDOMEN: Soft, nontender, nondistended. Bowel sounds present. No organomegaly or mass.  EXTREMITIES: No cyanosis, clubbing or edema b/l.    NEUROLOGIC: Cranial nerves II through XII are intact. No focal Motor or sensory deficits b/l.   PSYCHIATRIC:  patient is alert and oriented x 3.  SKIN: No obvious rash, lesion, or ulcer.   LABORATORY PANEL:  CBC  Recent Labs Lab 01/09/16 2325  01/11/16 0443  WBC 8.7  --   --   HGB 6.3*  < > 7.0*  HCT 18.7*  < > 21.3*  PLT 274  --   --   < > = values in this interval not displayed.  Chemistries   Recent Labs Lab 01/07/16 1425  01/10/16 0629  NA 141  < > 139  K 4.0  < > 4.2  CL 112*  < > 115*  CO2 21*  < > 21*  GLUCOSE 114*  < > 121*  BUN 25*  < > 12  CREATININE 1.48*  < > 0.82  CALCIUM 8.6*  < > 7.5*  AST 19  --   --   ALT 16  --   --   ALKPHOS 124  --   --   BILITOT 0.4  --   --   < > = values in this interval not displayed. Cardiac Enzymes  Recent Labs Lab 01/09/16 2325  TROPONINI <0.03   RADIOLOGY:  Dg Chest 2 View  01/09/2016  CLINICAL DATA:  Chest pain and dyspnea, onset this evening. EXAM: CHEST  2 VIEW COMPARISON:  07/28/2015 FINDINGS: The lungs are clear. The pulmonary vasculature is normal. Heart size is normal. Hilar and mediastinal contours are unremarkable. There is no pleural effusion. IMPRESSION: No active cardiopulmonary disease. Electronically Signed   By: Andreas Newport M.D.   On: 01/09/2016 23:59   Dg Chest Port 1 View  01/10/2016  CLINICAL DATA:  New right-sided PICC line. EXAM: PORTABLE CHEST 1 VIEW COMPARISON:  01/09/2016. FINDINGS: Right-sided PICC line is present with tip overlying the SVC lungs well inflated without consolidation or  effusion. Cardiomediastinal silhouette is within normal. Catheter partially visualized over overlying the epigastric region. IMPRESSION: No acute cardiopulmonary disease. Right-sided PICC line with tip overlying the SVC. Electronically Signed   By: Marin Olp M.D.   On: 01/10/2016 13:56   ASSESSMENT AND PLAN:   Melanie Bowen is a 55 y.o. female who presents with Abdominal pain and melena. Patient was seen here in the ED a couple of days ago with a complaint of GI bleed. Her hemoglobin was stable at that time. It was felt that the spleen was potentially due to hemorrhoids, and she was sent home. She returns today with a feeling of shortness of breath, fatigue, persistent abdominal pain, and recurrent bloody stool. Hemoglobin here today is 6, it was 12 two days ago.  1.GI bleed - upper or lower, though her history of recent surgery, emotional stress, and some ibuprofen use might seem to indicate upper GI process.  -on protonix drip, fluids to support her blood pressure, keep her nothing by mouth - GI consult with Dr Candace Cruise appreciated. for endoscopy itoday -6.3--2 units--6.6--2 units--7.0--1 unit  2. HTN (hypertension) - currently stable, though normotensive so hold home antihypertensives for now.  3. HIV (human immunodeficiency virus infection) ([place]) - continue home dose HAART therapy  4.GERD (gastroesophageal reflux disease) - PPI drip as above  5. Depression - continue home meds  6. Difficult IV access s/p pICC placement Case discussed with Care Management/Social Worker. Management plans discussed with the patient, family and they are in agreement.  CODE STATUS: full  DVT Prophylaxis: scd  TOTAL TIME TAKING CARE OF THIS PATIENT: 30 minutes.  >50% time spent on counselling and coordination of care  POSSIBLE D/C IN 1-2 DAYS, DEPENDING ON CLINICAL CONDITION.  Note: This dictation was prepared with Dragon dictation along with smaller phrase technology. Any transcriptional errors that  result from this process are unintentional.  Ricky Doan M.D on 01/11/2016 at 7:33 AM  Between 7am to 6pm - Pager - [phone]  After 6pm go to [email or web address] - password [email or web address] [facility]  Office  [phone]  CC: Primary care physician; [facility]

## 2016-01-11 NOTE — Progress Notes (Addendum)
Patient had another BM with some blood, but states "it looks like it is clearing up". This is the first BM that she has saved to show me and there was bright red blood visible. Patient denies SOB, dizziness, CP, lightheadedness. Unit of RBC is still infusing. Per PACU nurse, Dr. Candace Cruise was notified. Will place order for Gol[place] in preparation for colonoscopy tomorrow per Dr. Candace Cruise.

## 2016-01-11 NOTE — Transfer of Care (Signed)
Immediate Anesthesia Transfer of Care Note  Patient: Melanie Bowen  Procedure(s) Performed: Procedure(s): ESOPHAGOGASTRODUODENOSCOPY (EGD) (Left)  Patient Location: PACU and Endoscopy Unit  Anesthesia Type:General  Level of Consciousness: patient cooperative and lethargic  Airway & Oxygen Therapy: Patient Spontanous Breathing and Patient connected to nasal cannula oxygen  Post-op Assessment: Report given to RN and Post -op Vital signs reviewed and stable  Post vital signs: Reviewed and stable  Last Vitals:  Filed Vitals:   01/11/16 0441 01/11/16 0841  BP: 115/61 108/65  Pulse: 92 95  Temp: 36.9 C   Resp: 16 20    Last Pain:  Filed Vitals:   01/11/16 0842  PainSc: 0-No pain         Complications: No apparent anesthesia complications

## 2016-01-11 NOTE — Anesthesia Postprocedure Evaluation (Signed)
Anesthesia Post Note  Patient: Melanie Bowen  Procedure(s) Performed: Procedure(s) (LRB): ESOPHAGOGASTRODUODENOSCOPY (EGD) (Left)  Patient location during evaluation: PACU Anesthesia Type: General Level of consciousness: awake Pain management: satisfactory to patient Vital Signs Assessment: post-procedure vital signs reviewed and stable Respiratory status: spontaneous breathing Cardiovascular status: stable Anesthetic complications: no    Last Vitals:  Filed Vitals:   01/11/16 0841 01/11/16 0850  BP: 108/65 124/75  Pulse: 95 88  Temp: 35.7 C   Resp: 20 18    Last Pain:  Filed Vitals:   01/11/16 0857  PainSc: Asleep                 VAN STAVEREN,Tilak Oakley

## 2016-01-11 NOTE — Care Management Note (Addendum)
Case Management Note  Patient Details  Name: OPRAH HANDKE MRN: [id number] Date of Birth: Nov 29, 1960  Subjective/Objective:         55yo Ms Lorrane Surber was admitted 01/10/16 with abdominal pain and melena. (See list of chronic illnesses on H&P.)  PCP=[facility]. Pharmacy=[facility] in [place]. Lives alone but reports that she has a Medicaid home health aid from [facility] in [place] who comes to her home 7 days per week. No home oxygen, no home assistive equipment. She has used [facility] in the past and would like to use [facility] if she needs home health services after discharge. Ms Mitrovic reports that either her daughter or her Medicaid aid provide transportation for her. Mrs Meckel requests a rollator for home use. Please ask Corene Cornea from [facility] to evaluate her for a bariatric rollator due to her width not her weight. Mrs Kahana has received 4 units PRBCs and continues to bleed despite a normal EGD. She is scheduled for a colonoscopy on Monday 01/12/16. Case management will follow for discharge planning.       Action/Plan:   Expected Discharge Date:  01/12/16               Expected Discharge Plan:     In-House Referral:     Discharge planning Services     Post Acute Care Choice:    Choice offered to:     DME Arranged:    DME Agency:     HH Arranged:    HH Agency:     Status of Service:     Medicare Important Message Given:    Date Medicare IM Given:    Medicare IM give by:    Date Additional Medicare IM Given:    Additional Medicare Important Message give by:     If discussed at [place] of Stay Meetings, dates discussed:    Additional Comments:  Delaynie Stetzer A, RN 01/11/2016, 4:37 PM

## 2016-01-11 NOTE — Progress Notes (Signed)
Pt has had 3 brown and bloody liquid stools since 3pm. Started GoLytely at 5pm. Will continue to monitor and report any further bloody stools.

## 2016-01-11 NOTE — Op Note (Signed)
Still passing blood. Had 4 units of blood yesterday. Yet, Hgb still low. EGD done today was completely normal. Therefore, will need to repeat colonscopy tomorrow after bowel prep. Need to find out if pt had diverticulosis on last year's colonoscopy. If pt continues to pass blood today, would get bleeding scan today. Thanks.

## 2016-01-11 NOTE — Progress Notes (Signed)
Per night nurse patient is to receive an additional unit of blood this AM to treat hgb of 7.0. No order to transfuse blood seen in epic. Spoke to Dr. Posey Pronto, will place order for 1 unit now.

## 2016-01-11 NOTE — Anesthesia Preprocedure Evaluation (Signed)
Anesthesia Evaluation  Patient identified by MRN, date of birth, ID band Patient awake    Reviewed: Allergy & Precautions, NPO status , Patient's Chart, lab work & pertinent test results  Airway Mallampati: II       Dental  (+) Teeth Intact   Pulmonary COPD,  COPD inhaler, Current Smoker,    + rhonchi  + decreased breath sounds      Cardiovascular Exercise Tolerance: Good hypertension, Pt. on medications  Rhythm:Regular     Neuro/Psych    GI/Hepatic Neg liver ROS, GERD  Medicated,  Endo/Other  Morbid obesity  Renal/GU negative Renal ROS     Musculoskeletal   Abdominal (+) + obese,   Peds negative pediatric ROS (+)  Hematology   Anesthesia Other Findings   Reproductive/Obstetrics                             Anesthesia Physical Anesthesia Plan  ASA: III  Anesthesia Plan: General   Post-op Pain Management:    Induction: Intravenous  Airway Management Planned: Natural Airway and Nasal Cannula  Additional Equipment:   Intra-op Plan:   Post-operative Plan:   Informed Consent: I have reviewed the patients History and Physical, chart, labs and discussed the procedure including the risks, benefits and alternatives for the proposed anesthesia with the patient or authorized representative who has indicated his/her understanding and acceptance.     Plan Discussed with: CRNA  Anesthesia Plan Comments:         Anesthesia Quick Evaluation

## 2016-01-11 NOTE — Progress Notes (Signed)
MD notified of pt hgb of 7.0 after fourth unit transfused. MD ordered transfuse one unit RBC

## 2016-01-12 ENCOUNTER — Encounter
Admission: EM | Disposition: A | Discharge status: Home / Self Care | Payer: Self-pay | Source: Home / Self Care | Attending: Internal Medicine

## 2016-01-12 ENCOUNTER — Encounter: Payer: Self-pay | Admitting: Radiology

## 2016-01-12 ENCOUNTER — Inpatient Hospital Stay: Payer: Medicare Other

## 2016-01-12 HISTORY — PX: PERIPHERAL VASCULAR CATHETERIZATION: SHX172C

## 2016-01-12 LAB — HEMOGLOBIN AND HEMATOCRIT, BLOOD
HCT: 19.1 % — ABNORMAL LOW (ref 35.0–47.0)
HCT: 22.3 % — ABNORMAL LOW (ref 35.0–47.0)
HCT: 21.6 % — ABNORMAL LOW (ref 35.0–47.0)
Hemoglobin: 6.5 g/dL — ABNORMAL LOW (ref 12.0–16.0)
Hemoglobin: 7.5 g/dL — ABNORMAL LOW (ref 12.0–16.0)
Hemoglobin: 7.3 g/dL — ABNORMAL LOW (ref 12.0–16.0)

## 2016-01-12 LAB — PREPARE RBC (CROSSMATCH)

## 2016-01-12 LAB — HAPTOGLOBIN: Haptoglobin: 69 mg/dL (ref 34–200)

## 2016-01-12 SURGERY — VISCERAL ANGIOGRAPHY
Anesthesia: Moderate Sedation

## 2016-01-12 SURGERY — VISCERAL ARTERY INTERVENTION
Anesthesia: Moderate Sedation

## 2016-01-12 SURGERY — COLONOSCOPY WITH PROPOFOL
Anesthesia: General

## 2016-01-12 MED ORDER — LIDOCAINE HCL (PF) 1 % IJ SOLN
INTRAMUSCULAR | Status: AC
  Filled 2016-01-12: qty 30

## 2016-01-12 MED ORDER — OXYCODONE HCL 5 MG PO TABS
15.0000 mg | ORAL_TABLET | Freq: Four times a day (QID) | ORAL | Status: DC | PRN
  Administered 2016-01-12 – 2016-01-14 (×6): 15 mg via ORAL
  Filled 2016-01-12 (×6): qty 3

## 2016-01-12 MED ORDER — MIDAZOLAM HCL 2 MG/2ML IJ SOLN
INTRAMUSCULAR | Status: DC | PRN
  Administered 2016-01-12: 2 mg via INTRAVENOUS
  Administered 2016-01-12: 1 mg via INTRAVENOUS

## 2016-01-12 MED ORDER — LIDOCAINE-EPINEPHRINE (PF) 1 %-1:200000 IJ SOLN
INTRAMUSCULAR | Status: AC
  Filled 2016-01-12: qty 30

## 2016-01-12 MED ORDER — SODIUM CHLORIDE 0.9 % IV SOLN
Freq: Once | INTRAVENOUS | Status: AC
  Administered 2016-01-12: 12:00:00 via INTRAVENOUS

## 2016-01-12 MED ORDER — CHLORHEXIDINE GLUCONATE 0.12 % MT SOLN
15.0000 mL | Freq: Two times a day (BID) | OROMUCOSAL | Status: DC
  Administered 2016-01-12: 15 mL via OROMUCOSAL
  Filled 2016-01-12: qty 15

## 2016-01-12 MED ORDER — TECHNETIUM TC 99M-LABELED RED BLOOD CELLS IV KIT
19.4400 | PACK | Freq: Once | INTRAVENOUS | Status: AC | PRN
  Administered 2016-01-12: 19.44 via INTRAVENOUS

## 2016-01-12 MED ORDER — LIDOCAINE-EPINEPHRINE (PF) 1 %-1:200000 IJ SOLN
INTRAMUSCULAR | Status: DC | PRN
  Administered 2016-01-12: 10 mL via INTRADERMAL

## 2016-01-12 MED ORDER — CETYLPYRIDINIUM CHLORIDE 0.05 % MT LIQD: 7.0000 mL | Freq: Two times a day (BID) | OROMUCOSAL | Status: DC

## 2016-01-12 MED ORDER — FENTANYL CITRATE (PF) 100 MCG/2ML IJ SOLN
INTRAMUSCULAR | Status: AC
  Filled 2016-01-12: qty 2

## 2016-01-12 MED ORDER — HEPARIN (PORCINE) IN NACL 2-0.9 UNIT/ML-% IJ SOLN
INTRAMUSCULAR | Status: AC
  Filled 2016-01-12: qty 1000

## 2016-01-12 MED ORDER — MIDAZOLAM HCL 5 MG/5ML IJ SOLN
INTRAMUSCULAR | Status: AC
  Filled 2016-01-12: qty 5

## 2016-01-12 MED ORDER — HYDROMORPHONE HCL 1 MG/ML IJ SOLN
INTRAMUSCULAR | Status: AC
  Filled 2016-01-12: qty 1

## 2016-01-12 MED ORDER — HEPARIN SODIUM (PORCINE) 1000 UNIT/ML IJ SOLN
INTRAMUSCULAR | Status: AC
  Filled 2016-01-12: qty 1

## 2016-01-12 MED ORDER — FENTANYL CITRATE (PF) 100 MCG/2ML IJ SOLN
INTRAMUSCULAR | Status: DC | PRN
  Administered 2016-01-12 (×2): 50 ug via INTRAVENOUS

## 2016-01-12 SURGICAL SUPPLY — 14 items
BLOCK BEAD 500-700 (Vascular Products) ×2 IMPLANT
CATH 5FR REUT (CATHETERS) ×2 IMPLANT
CATH MICROCATH PRGRT 2.8F 110 (CATHETERS) IMPLANT
CATH PIG 70CM (CATHETERS) ×2 IMPLANT
DEVICE STARCLOSE SE CLOSURE (Vascular Products) ×2 IMPLANT
DEVICE TORQUE (MISCELLANEOUS) ×2 IMPLANT
GLIDEWIRE STIFF .35X180X3 HYDR (WIRE) ×2 IMPLANT
MICROCATH PROGREAT 2.8F 110 CM (CATHETERS) ×3
PACK ANGIOGRAPHY (CUSTOM PROCEDURE TRAY) ×2 IMPLANT
SHEATH BRITE TIP 5FRX11 (SHEATH) ×2 IMPLANT
SYR MEDRAD MARK V 150ML (SYRINGE) ×2 IMPLANT
TOWEL OR 17X26 4PK STRL BLUE (TOWEL DISPOSABLE) ×2 IMPLANT
TUBING CONTRAST HIGH PRESS 72 (TUBING) ×2 IMPLANT
WIRE J 3MM .035X145CM (WIRE) ×4 IMPLANT

## 2016-01-12 NOTE — Op Note (Signed)
[facility] Percutaneous Study/Intervention Procedural Note     Surgeon(s): Shantavia Jha  Assistants: none  Pre-operative Diagnosis: 1. Lower GI bleed 2. Acute blood loss anemia  Post-operative diagnosis: Same  Procedure(s) Performed: 1. Ultrasound guidance for vascular access right femoral artery 2. Catheter placement into tertiary IMA branch into the splenic flexure all the way back towards the transverse colon from right femoral approach 3. Aortogram and selective angiogram of the IMA and splenic flexure 4. Microbead embolization of tertiary IMA branch into the splenic flexure with 500-700  polyvinyl alcohol beads. 5. StarClose closure device right femoral artery  Anesthesia: Moderate conscious sedation for approximately 30 minutes using 3 mg of Versed and 100 mcg of Fentanyl  EBL: 25 cc  Fluoro Time: 6.2 minutes  Contrast: 50 cc  Indications: Patient is a  with brisk lower GI bleeding with resultant anemia. The patient has a nuclear medicine study showing active bleeding from the splenic flexure. The patient is brought in for angiography for further evaluation and potential treatment. Risks and benefits are discussed and informed consent is obtained  Procedure: The patient was identified and appropriate procedural time out was performed. The patient was then placed supine on the table and prepped and draped in the usual sterile fashion.Moderate conscious sedation was administered during a face to face encounter with the patient throughout the procedure with my supervision of the RN administering medicines and monitoring the patient's vital signs, pulse oximetry, telemetry and mental status throughout from the start of the procedure until the patient was taken to the recovery room.  Ultrasound was used to evaluate the right common femoral artery. It was patent .  A digital ultrasound image was acquired. A Seldinger needle was used to access the right common femoral artery under direct ultrasound guidance and a permanent image was performed. A 0.035 J wire was advanced without resistance and a 5Fr sheath was placed. Pigtail catheter was placed into the aorta and an RAO projection aortogram was performed. This demonstrated normal origins of the renal arteries and SMA. The IMA came off the mid to distal infrarenal aorta. We transitioned to a steeper RAO projection to cannulate the IMA. A VS 1 catheter was used to selectively cannulate the IMA.  This demonstrated brisk flow through the IMA with what appeared to be a mild stenosis proximally but may have been spasm. There was a typical plexus with a early secondary branch of the IMA going very far superior directly to the splenic flexure. This was the area of desired treatment. Based on her continued bleeding and the nuclear medicine study I elected to treat this area with embolization. I initially advanced the Pro-Great microcatheter out the IMA and then into the superior secondary branch going towards the splenic flexure. I continued to advance this beyond the next arterial bifurcation which was going downward to the proximal left colon and advanced into the area of the splenic flexure. I actually was able to advance the catheter all the way out until the catheter was going into the transverse colon for selective imaging. I then pulled the pro-great microcatheter back to the area of the splenic flexure and instilled approximately 1.75-2 cc of 500-700  polyvinyl alcohol beads in this location. Angiogram following this showed the main vessels to be open with less brisk filling. I then pulled back and got to the area of the proximal left colon and splenic flexure branching and treated this area to ensure that we did not miss any area of  possible bleeding. Another 0.75 cc of 500-700   polyvinyl alcohol beads were deployed in  the branch going to the splenic flexure and proximal left colon. Again, completion angiogram showed the main vessels to be open with less brisk filling. . I elected to terminate the procedure. The diagnostic catheter was removed. StarClose closure device was deployed in usual fashion with excellent hemostatic result. The patient was taken to the recovery room in stable condition having tolerated the procedure well.     Findings: As above  Disposition: Patient was taken to the recovery room in stable condition having tolerated the procedure well.  Complications: None  Destine Zirkle 01/12/2016 1:23 PM

## 2016-01-12 NOTE — Progress Notes (Signed)
Patient ID: Melanie Bowen, female   DOB: Mar 19, 1961, 55 y.o.   MRN: [id number] [facility] [place] NAME: Melanie Bowen    MR#:  [id number]  DATE OF BIRTH:  Aug 18, 1961  SUBJECTIVE:  Came in with weakness and found to have hgb of 6.3 Pt has had 5 units BT. Having BRBPR x2 this morning at 6 am Completed colon prep Appears frustrated  REVIEW OF SYSTEMS:   Review of Systems  Constitutional: Negative for fever, chills and weight loss.  HENT: Negative for ear discharge, ear pain and nosebleeds.   Eyes: Negative for blurred vision, pain and discharge.  Respiratory: Negative for sputum production, shortness of breath, wheezing and stridor.   Cardiovascular: Negative for chest pain, palpitations, orthopnea and PND.  Gastrointestinal: Positive for abdominal pain, blood in stool and melena. Negative for nausea, vomiting and diarrhea.  Genitourinary: Negative for urgency and frequency.  Musculoskeletal: Negative for back pain and joint pain.  Neurological: Positive for weakness. Negative for sensory change, speech change and focal weakness.  Psychiatric/Behavioral: Negative for depression and hallucinations. The patient is not nervous/anxious.   All other systems reviewed and are negative.  Tolerating Diet; npo Tolerating PT: not needed  DRUG ALLERGIES:   Allergies  Allergen Reactions  . Morphine And Related Other (See Comments)    itching  . Gabapentin Rash  . Zanaflex  [Tizanidine] Rash    VITALS:  Blood pressure 111/64, pulse 94, temperature 98.8 F (37.1 C), temperature source Oral, resp. rate 18, height 5\' 6"  (1.676 m), weight 121.655 kg (268 lb 3.2 oz), SpO2 100 %.  PHYSICAL EXAMINATION:   Physical Exam  GENERAL:  55 y.o.-year-old patient lying in the bed with no acute distress. Obese,pallor+ EYES: Pupils equal, round, reactive to light and accommodation. No scleral icterus. Extraocular muscles intact. Pallor+ HEENT:  Head atraumatic, normocephalic. Oropharynx and nasopharynx clear.  NECK:  Supple, no jugular venous distention. No thyroid enlargement, no tenderness.  LUNGS: Normal breath sounds bilaterally, no wheezing, rales, rhonchi. No use of accessory muscles of respiration.  CARDIOVASCULAR: S1, S2 normal. No murmurs, rubs, or gallops.  ABDOMEN: Soft, nontender, nondistended. Bowel sounds present. No organomegaly or mass.  EXTREMITIES: No cyanosis, clubbing or edema b/l.    NEUROLOGIC: Cranial nerves II through XII are intact. No focal Motor or sensory deficits b/l.   PSYCHIATRIC:  patient is alert and oriented x 3.  SKIN: No obvious rash, lesion, or ulcer.   LABORATORY PANEL:  CBC  Recent Labs Lab 01/09/16 2325  01/12/16 0540  WBC 8.7  --   --   HGB 6.3*  < > 6.5*  HCT 18.7*  < > 19.1*  PLT 274  --   --   < > = values in this interval not displayed.  Chemistries   Recent Labs Lab 01/07/16 1425  01/10/16 0629  NA 141  < > 139  K 4.0  < > 4.2  CL 112*  < > 115*  CO2 21*  < > 21*  GLUCOSE 114*  < > 121*  BUN 25*  < > 12  CREATININE 1.48*  < > 0.82  CALCIUM 8.6*  < > 7.5*  AST 19  --   --   ALT 16  --   --   ALKPHOS 124  --   --   BILITOT 0.4  --   --   < > = values in this interval not displayed. Cardiac Enzymes  Recent Labs  Lab 01/09/16 2325  TROPONINI <0.03   RADIOLOGY:  Dg Chest Port 1 View  01/10/2016  CLINICAL DATA:  New right-sided PICC line. EXAM: PORTABLE CHEST 1 VIEW COMPARISON:  01/09/2016. FINDINGS: Right-sided PICC line is present with tip overlying the SVC lungs well inflated without consolidation or effusion. Cardiomediastinal silhouette is within normal. Catheter partially visualized over overlying the epigastric region. IMPRESSION: No acute cardiopulmonary disease. Right-sided PICC line with tip overlying the SVC. Electronically Signed   By: Marin Olp M.D.   On: 01/10/2016 13:56   ASSESSMENT AND PLAN:   Melanie Bowen is a 55 y.o. female who presents  with Abdominal pain and melena. Patient was seen here in the ED a couple of days ago with a complaint of GI bleed. Her hemoglobin was stable at that time. It was felt that the spleen was potentially due to hemorrhoids, and she was sent home. She returns today with a feeling of shortness of breath, fatigue, persistent abdominal pain, and recurrent bloody stool. Hemoglobin here today is 6, it was 12 two days ago.  1.GI bleed - upper or lower, though her history of recent surgery, emotional stress, and some ibuprofen use might seem to indicate upper GI process.  -on protonix drip, fluids to support her blood pressure, keep her nothing by mouth - GI consult with Dr Candace Cruise appreciated. - EGD normal endoscopy -6.3--2 units--6.6--2 units--7.0--1 unit--7.1---7.3---6.3---1 unit -after colon prep had BRBPR this am -spoke with Dr Candace Cruise. Stat GI bleed scan  2. HTN (hypertension) - currently stable, though normotensive so hold home antihypertensives for now.  3. HIV (human immunodeficiency virus infection) ([place]) - continue home dose HAART therapy  4.GERD (gastroesophageal reflux disease) - PPI drip as above  5. Depression - continue home meds  6. Difficult IV access s/p pICC placement  Case discussed with Care Management/Social Worker. Management plans discussed with the patient, family and they are in agreement.  CODE STATUS: full  DVT Prophylaxis: scd  TOTAL TIME TAKING CARE OF THIS PATIENT: 30 minutes.  >50% time spent on counselling and coordination of care  POSSIBLE D/C IN 1-2 DAYS, DEPENDING ON CLINICAL CONDITION.  Note: This dictation was prepared with Dragon dictation along with smaller phrase technology. Any transcriptional errors that result from this process are unintentional.  Kristyanna Barcelo M.D on 01/12/2016 at 7:21 AM  Between 7am to 6pm - Pager - [phone]  After 6pm go to [email or web address] - password [email or web address] [facility]  Office  [phone]  CC: Primary care  physician; [facility]

## 2016-01-12 NOTE — Care Management (Signed)
Rollator requested with this patient request from [facility].

## 2016-01-12 NOTE — Progress Notes (Signed)
Pt is requesting pain med.  It is not time for dilaudid.  Pt says she usually takes oxycodone 15mg  every 6 hours when needed.  No diet ordered.  Dr Posey Pronto gave order for clear liquid diet and oxycodone

## 2016-01-12 NOTE — Consult Note (Signed)
  Pt with positive bleeding scan. To have Dr. [name] from vascular surgery for possible angiography and embolization. Therefore, will cancel colonoscopy for today. Thanks.

## 2016-01-12 NOTE — Consult Note (Signed)
[facility] Vascular Consult Note  MRN : [id number]  Melanie Bowen is a 55 y.o. ([birth year]) female who presents with chief complaint of  Chief Complaint  Patient presents with  . Chest Pain  . Shortness of Breath  .  History of Present Illness: I am asked by Dr. Posey Pronto to see the patient regarding brisk GI bleeding. She was admitted several days earlier with chest pain and shortness of breath. She had heme positive stools at the time of her admission, and initially was felt that she had an upper GI bleed. Her chest pain and shortness of breath are largely improved, but she continues to have active and ongoing bleeding. The bleeding turned more bright red and brisk over the past 24 hours. She had an upper endoscopy which was unrevealing and no source of bleeding was identified yesterday. She has no previous history of GI problems or GI bleeding to her knowledge. Her bleeding became quite brisk and she has now required 6 units of packed red blood cells. She is not really having a lot of abdominal pain. She has no nausea or vomiting. She has no fevers or chills or signs of systemic infection. With her brisk ongoing bleeding, a bleeding scan was performed which I have independently reviewed. This demonstrates brisk active bleeding which seems to originate from the splenic flexure. Retrograde flow is seen back towards the hepatic flexure. Tracer activity also seen tracking through the left colon down to the sigmoid colon. The original spot of bleeding does appear to be the splenic flexure. Given these findings, we are consult and for evaluation for embolization  Current Facility-Administered Medications  Medication Dose Route Frequency Provider Last Rate Last Dose  . [MAR Hold] acetaminophen (TYLENOL) tablet 650 mg  650 mg Oral Q6H PRN Lance Coon, MD       Or  . Doug Sou Hold] acetaminophen (TYLENOL) suppository 650 mg  650 mg Rectal Q6H PRN Lance Coon, MD      . Doug Sou Hold]  amLODipine (NORVASC) tablet 10 mg  10 mg Oral QAC breakfast Lance Coon, MD   10 mg at 01/11/16 0941  . [MAR Hold] antiseptic oral rinse (CPC / CETYLPYRIDINIUM CHLORIDE 0.05%) solution 7 mL  7 mL Mouth Rinse q12n4p Fritzi Mandes, MD      . Doug Sou Hold] chlorhexidine (PERIDEX) 0.12 % solution 15 mL  15 mL Mouth Rinse BID Fritzi Mandes, MD   15 mL at 01/12/16 0947  . [MAR Hold] efavirenz-emtricitabine-tenofovir (ATRIPLA) [id number] MG per tablet 1 tablet  1 tablet Oral QHS Fritzi Mandes, MD   1 tablet at 01/11/16 2106  . [MAR Hold] HYDROmorphone (DILAUDID) injection 0.5 mg  0.5 mg Intravenous Q4H PRN Lance Coon, MD   0.5 mg at 01/12/16 0129  . [MAR Hold] mometasone-formoterol (DULERA) 200-5 MCG/ACT inhaler 2 puff  2 puff Inhalation BID Lance Coon, MD   2 puff at 01/12/16 0947  . [MAR Hold] ondansetron (ZOFRAN) tablet 4 mg  4 mg Oral Q6H PRN Lance Coon, MD       Or  . Doug Sou Hold] ondansetron [facility]) injection 4 mg  4 mg Intravenous Q6H PRN Lance Coon, MD      . Doug Sou Hold] sodium chloride flush (NS) 0.9 % injection 3 mL  3 mL Intravenous Q12H Lance Coon, MD   3 mL at 01/11/16 2106  . [MAR Hold] valACYclovir (VALTREX) tablet 1,000 mg  1,000 mg Oral QHS Fritzi Mandes, MD   1,000 mg at 01/11/16 2106  Past Medical History  Diagnosis Date  . Asthma   . Cough   . Genital herpes   . HIV (human immunodeficiency virus infection) ([place])   . Bronchitis   . GERD (gastroesophageal reflux disease)   . Eye irritation   . Paresthesia of hand, bilateral   . Blepharitis   . Allergic rhinitis   . Hypertension   . Cigarette smoker   . Headache   . Shortness of breath dyspnea     at times due to asthma  . Depression   . Arthritis     DDD  . Claustrophobia     Past Surgical History  Procedure Laterality Date  . Knee surgery Bilateral 2003,2007    total Joint  . Hand surgery Bilateral     carpal tunnel  . Tubal ligation    . Laparoscopy for ectopic pregnancy    . Bunionectomy Bilateral     feet  .  Finger fracture Right     5th finger  . Back surgery may 2016      lumbar   . Video bronchoscopy N/A 06/30/2015    Procedure: VIDEO BRONCHOSCOPY WITHOUT FLUORO;  Surgeon: Vilinda Boehringer, MD;  Location: [facility] ORS;  Service: Cardiopulmonary;  Laterality: N/A;  . Diagnostic laparoscopy    . Back surgery    . Joint replacement Bilateral     knees    Social History Social History  Substance Use Topics  . Smoking status: Light Tobacco Smoker -- 0.20 packs/day for 38 years    Types: Cigarettes  . Smokeless tobacco: Never Used  . Alcohol Use: 3.0 oz/week    5 Glasses of wine, 0 Standard drinks or equivalent per week     Comment: wine - couple days of week    Family History Family History  Problem Relation Age of Onset  . Breast cancer Sister   no bleeding disorders, clotting disorders, or autoimmune diseases  Allergies  Allergen Reactions  . Morphine And Related Other (See Comments)    itching  . Gabapentin Rash  . Zanaflex  [Tizanidine] Rash     REVIEW OF SYSTEMS (Negative unless checked)  Constitutional: [] Weight loss  [] Fever  [] Chills Cardiac: [x] Chest pain   [] Chest pressure   [] Palpitations   [] Shortness of breath when laying flat   [] Shortness of breath at rest   [x] Shortness of breath with exertion. Vascular:  [] Pain in legs with walking   [] Pain in legs at rest   [] Pain in legs when laying flat   [] Claudication   [] Pain in feet when walking  [] Pain in feet at rest  [] Pain in feet when laying flat   [] History of DVT   [] Phlebitis   [] Swelling in legs   [] Varicose veins   [] Non-healing ulcers Pulmonary:   [] Uses home oxygen   [] Productive cough   [] Hemoptysis   [] Wheeze  [] COPD   [] Asthma Neurologic:  [] Dizziness  [] Blackouts   [] Seizures   [] History of stroke   [] History of TIA  [] Aphasia   [] Temporary blindness   [] Dysphagia   [] Weakness or numbness in arms   [] Weakness or numbness in legs Musculoskeletal:  [] Arthritis   [] Joint swelling   [] Joint pain   [] Low back  pain Hematologic:  [] Easy bruising  [] Easy bleeding   [] Hypercoagulable state   [x] Anemic  [] Hepatitis Gastrointestinal:  [x] Blood in stool   [] Vomiting blood  [x] Gastroesophageal reflux/heartburn   [] Difficulty swallowing. Genitourinary:  [] Chronic kidney disease   [] Difficult urination  [] Frequent urination  [] Burning with urination   []   Blood in urine Skin:  [] Rashes   [] Ulcers   [] Wounds Psychological:  [] History of anxiety   []  History of major depression.  Physical Examination  Filed Vitals:   01/12/16 0432 01/12/16 0925 01/12/16 1015 01/12/16 1200  BP: 111/64 131/60 130/65 131/66  Pulse: 94 99 97 85  Temp:  98.2 F (36.8 C) 98.3 F (36.8 C)   TempSrc:  Oral Oral   Resp:    16  Height:      Weight:      SpO2: 100% 99% 100% 100%   Body mass index is 43.31 kg/(m^2). Gen:  WD/WN, NAD Head: [place]/AT, No temporalis wasting. Prominent temp pulse not noted. Ear/Nose/Throat: Hearing grossly intact, nares w/o erythema or drainage, oropharynx w/o Erythema/Exudate Eyes: PERRLA, EOMI.  Neck: Supple, no nuchal rigidity.  No JVD.  Pulmonary:  Good air movement, equal bilaterally.  Cardiac: RRR, normal S1, S2 Vascular:  Vessel Right Left  Radial Palpable Palpable  Ulnar Palpable Palpable  Brachial Palpable Palpable  Carotid Palpable, without bruit Palpable, without bruit  Aorta Not palpable N/A  Femoral Palpable Palpable  Popliteal Palpable Palpable  PT Palpable Palpable  DP Palpable Palpable   Gastrointestinal: soft, non-tender/non-distended. No guarding/reflex. No masses, surgical incisions, or scars. Musculoskeletal: M/S 5/5 throughout.  Extremities without ischemic changes.  No deformity or atrophy. No edema. Neurologic: CN 2-12 intact. Pain and light touch intact in extremities.  Symmetrical.  Speech is fluent. Motor exam as listed above. Psychiatric: Judgment intact, Mood & affect appropriate for pt's clinical situation. Dermatologic: No rashes or ulcers noted.  No cellulitis  or open wounds. Lymph : No Cervical, Axillary, or Inguinal lymphadenopathy.    CBC Lab Results  Component Value Date   WBC 8.7 01/09/2016   HGB 6.5* 01/12/2016   HCT 19.1* 01/12/2016   MCV 94.9 01/09/2016   PLT 274 01/09/2016    BMET    Component Value Date/Time   NA 139 01/10/2016 0629   NA 139 12/21/2014 0710   K 4.2 01/10/2016 0629   K 3.6 12/21/2014 0710   CL 115* 01/10/2016 0629   CL 108 12/21/2014 0710   CO2 21* 01/10/2016 0629   CO2 24 12/21/2014 0710   GLUCOSE 121* 01/10/2016 0629   GLUCOSE 85 12/21/2014 0710   BUN 12 01/10/2016 0629   BUN 16 12/21/2014 0710   CREATININE 0.82 01/10/2016 0629   CREATININE 0.96 12/21/2014 0710   CALCIUM 7.5* 01/10/2016 0629   CALCIUM 8.4* 12/21/2014 0710   GFRNONAA >60 01/10/2016 0629   GFRNONAA >60 12/21/2014 0710   GFRNONAA >60 09/24/2011 0847   GFRAA >60 01/10/2016 0629   GFRAA >60 12/21/2014 0710   GFRAA >60 09/24/2011 0847   Estimated Creatinine Clearance: 105.6 mL/min (by C-G formula based on Cr of 0.82).  COAG No results found for: INR, PROTIME  Radiology Dg Chest 2 View  01/09/2016  CLINICAL DATA:  Chest pain and dyspnea, onset this evening. EXAM: CHEST  2 VIEW COMPARISON:  07/28/2015 FINDINGS: The lungs are clear. The pulmonary vasculature is normal. Heart size is normal. Hilar and mediastinal contours are unremarkable. There is no pleural effusion. IMPRESSION: No active cardiopulmonary disease. Electronically Signed   By: Andreas Newport M.D.   On: 01/09/2016 23:59   Nm Gi Blood Loss  01/12/2016  CLINICAL DATA:  Bright red blood per rectum EXAM: NUCLEAR MEDICINE GASTROINTESTINAL BLEEDING SCAN TECHNIQUE: Sequential abdominal images were obtained following intravenous administration of Tc-3m labeled red blood cells. RADIOPHARMACEUTICALS:  19.44 mCi Tc-55m in-vitro labeled red  cells. COMPARISON:  CT 12/31/2010 FINDINGS: Active extravasation in the splenic flexure/proximal descending region of the colon. On subsequent  images there is further colonic activity spreading retrograde to the hepatic flexure and distally to the sigmoid. IMPRESSION: 1. Active bleed in the splenic flexure/proximal descending region of the colon. Critical Value/emergent results were called by telephone at the time of interpretation on 01/12/2016 at 9:20 am to Dr. Fritzi Mandes , who verbally acknowledged these results. Electronically Signed   By: Lucrezia Europe M.D.   On: 01/12/2016 09:24   Dg Chest Port 1 View  01/10/2016  CLINICAL DATA:  New right-sided PICC line. EXAM: PORTABLE CHEST 1 VIEW COMPARISON:  01/09/2016. FINDINGS: Right-sided PICC line is present with tip overlying the SVC lungs well inflated without consolidation or effusion. Cardiomediastinal silhouette is within normal. Catheter partially visualized over overlying the epigastric region. IMPRESSION: No acute cardiopulmonary disease. Right-sided PICC line with tip overlying the SVC. Electronically Signed   By: Marin Olp M.D.   On: 01/10/2016 13:56      Assessment/Plan 1. Brisk lower GI bleed with positive bleeding scan. I have had a long discussion with the patient regarding treatment options. Observation at this point would seem like a less favorable option given the fact she is artery required multiple units of blood transfusion and has a positive bleeding scan consistent with continued bleeding. The 2 other major options would be surgical therapy with resection and possible ostomy versus embolization. Embolization would be less invasive, and have a lower risk of complications. I discussed about a 5-10% risk of continued bleeding or early rebleeding. I discussed about a 1-2% risk of ischemia. The patient and her family voiced their understanding and desire to proceed with surgical resection as a backup option if her bleeding continues. 2. Acute blood loss anemia. Due to #1. Transfuse as necessary.   Otila Starn, MD  01/12/2016 12:29 PM

## 2016-01-12 NOTE — Consult Note (Signed)
  Pt back in room post embolization. Sore in the groin. Otherwise, feels fine. Ok to start clears and advance diet as tolerated. Hopefully, no further bleeding post embolization.

## 2016-01-12 NOTE — Progress Notes (Signed)
Patient ID: Melanie Bowen, female   DOB: 20-Jan-1961, 55 y.o.   MRN: [id number] Informed of positive Bleeding scan from splenic flexure area. Informed Dr dew. Plans for microbead embolization today. Dr Candace Cruise informed of the GI bleed scan results Pt informed of vascular consult and further w/u

## 2016-01-12 NOTE — Progress Notes (Signed)
MD notified of patient's hmgb 6.5 this morning. Order placed to transfuse 1 unit RBC. Nursing continues to monitor.

## 2016-01-12 NOTE — Progress Notes (Signed)
During bowel prep, Melanie Bowen's stool began to clear up and had 3 clear, watery stools.  The last 2 BMs this morning after bowel prep have been bright red blood. Nursing continues to monitor and notify MD.

## 2016-01-12 NOTE — Consult Note (Signed)
  Drank bowel prep last night without bleeding. However, pt passing gross blood per rectum this AM with drop in hgb. Was able to review last colonoscopy by Dr. Gustavo Lah last year. Pt had diffuse diverticulosis throughout the colon. Pt told me she had completely normal colonoscopy. Thus, pt likely has diverticular bleeding. Agree with ordering bleeding scan this AM. If positive, recommend vascular surgery consult for possible embolization. If bleeding scan negative, then proceed with colonoscopy later this afternoon.

## 2016-01-12 NOTE — Progress Notes (Signed)
Pt transported to vascular lab for embolization.  Blood is still infusing so I escorted the pt along with the orderly

## 2016-01-13 ENCOUNTER — Inpatient Hospital Stay: Payer: Medicare Other

## 2016-01-13 LAB — HEMOGLOBIN: Hemoglobin: 7.2 g/dL — ABNORMAL LOW (ref 12.0–16.0)

## 2016-01-13 NOTE — Care Management (Signed)
Patient has declined rollator. She had a walker delivered in March of this year (less than 5 years ago) and the its been over 30-days since purchase so rollator will not be covered.

## 2016-01-13 NOTE — Progress Notes (Signed)
[facility]  Daily Progress Note   Subjective  - 1 Day Post-Op  Abdominal pain overnight.  No further BM or blood per rectum Feeling better this am.    Objective Filed Vitals:   01/12/16 1837 01/12/16 1921 01/13/16 0547 01/13/16 0807  BP: 131/66 121/59 112/85 115/54  Pulse: 78 95 100 105  Temp: 97.5 F (36.4 C) 98.5 F (36.9 C) 98.7 F (37.1 C) 99 F (37.2 C)  TempSrc: Oral Oral Oral Oral  Resp: 14 18 19 18   Height:      Weight:      SpO2: 99% 100% 99% 100%    Intake/Output Summary (Last 24 hours) at 01/13/16 0916 Last data filed at 01/12/16 1445  Gross per 24 hour  Intake    360 ml  Output      0 ml  Net    360 ml    PULM  CTAB CV  RRR VASC  Access site C/d/I. Abdomen mildly distended and tender, but no rebound or guarding  Laboratory CBC    Component Value Date/Time   WBC 8.7 01/09/2016 2325   WBC 7.7 12/21/2014 0710   HGB 7.3* 01/12/2016 1817   HGB 14.2 12/21/2014 0710   HCT 21.6* 01/12/2016 1817   HCT 42.7 12/21/2014 0710   PLT 274 01/09/2016 2325   PLT 330 12/21/2014 0710    BMET    Component Value Date/Time   NA 139 01/10/2016 0629   NA 139 12/21/2014 0710   K 4.2 01/10/2016 0629   K 3.6 12/21/2014 0710   CL 115* 01/10/2016 0629   CL 108 12/21/2014 0710   CO2 21* 01/10/2016 0629   CO2 24 12/21/2014 0710   GLUCOSE 121* 01/10/2016 0629   GLUCOSE 85 12/21/2014 0710   BUN 12 01/10/2016 0629   BUN 16 12/21/2014 0710   CREATININE 0.82 01/10/2016 0629   CREATININE 0.96 12/21/2014 0710   CALCIUM 7.5* 01/10/2016 0629   CALCIUM 8.4* 12/21/2014 0710   GFRNONAA >60 01/10/2016 0629   GFRNONAA >60 12/21/2014 0710   GFRNONAA >60 09/24/2011 0847   GFRAA >60 01/10/2016 0629   GFRAA >60 12/21/2014 0710   GFRAA >60 09/24/2011 0847    Assessment/Planning: POD #1 s/p mesenteric embolization for GI bleed   No more bleeding.  No BM overnight  Abdominal pain not surprising, and should pass today  Slowly advance diet today  and proceed to regular diet tomorrow  No other recs from vascular POV    Melanie Bowen  01/13/2016, 9:16 AM

## 2016-01-13 NOTE — Progress Notes (Signed)
Family at the bedside. Room air. Pt reported pain and received meds. A & O. No further BMs. MD was made aware of cramping and numbess, no further orders. Pt has no further concerns at this time.

## 2016-01-13 NOTE — Care Management Important Message (Signed)
Important Message  Patient Details  Name: Melanie Bowen MRN: [id number] Date of Birth: 21-Jul-1961   Medicare Important Message Given:  Yes    [name] A Saralynn Langhorst 01/13/2016, 3:50 PM

## 2016-01-13 NOTE — Progress Notes (Signed)
Pt has temp 100.9 Dr Posey Pronto was made aware of. Will give Tylenol as order on [facility]

## 2016-01-13 NOTE — Progress Notes (Signed)
Patient ID: Melanie Bowen, female   DOB: 09-09-1960, 55 y.o.   MRN: [id number] [facility] NAME: Melanie Bowen    MR#:  [id number]  DATE OF BIRTH:  Apr 01, 1961  SUBJECTIVE:  Came in with weakness and found to have hgb of 6.3 Pt has had 6 units BT. No more BM since y'day Some abdominal cramping  REVIEW OF SYSTEMS:   Review of Systems  Constitutional: Negative for fever, chills and weight loss.  HENT: Negative for ear discharge, ear pain and nosebleeds.   Eyes: Negative for blurred vision, pain and discharge.  Respiratory: Negative for sputum production, shortness of breath, wheezing and stridor.   Cardiovascular: Negative for chest pain, palpitations, orthopnea and PND.  Gastrointestinal: Positive for abdominal pain, blood in stool and melena. Negative for nausea, vomiting and diarrhea.  Genitourinary: Negative for urgency and frequency.  Musculoskeletal: Negative for back pain and joint pain.  Neurological: Positive for weakness. Negative for sensory change, speech change and focal weakness.  Psychiatric/Behavioral: Negative for depression and hallucinations. The patient is not nervous/anxious.   All other systems reviewed and are negative.  Tolerating Diet; FLD Tolerating PT: not needed  DRUG ALLERGIES:   Allergies  Allergen Reactions  . Morphine And Related Other (See Comments)    itching  . Gabapentin Rash  . Zanaflex  [Tizanidine] Rash    VITALS:  Blood pressure 112/85, pulse 100, temperature 98.7 F (37.1 C), temperature source Oral, resp. rate 19, height 5\' 6"  (1.676 m), weight 121.655 kg (268 lb 3.2 oz), SpO2 99 %.  PHYSICAL EXAMINATION:   Physical Exam  GENERAL:  55 y.o.-year-old patient lying in the bed with no acute distress. Obese,pallor+ EYES: Pupils equal, round, reactive to light and accommodation. No scleral icterus. Extraocular muscles intact. Pallor+ HEENT: Head atraumatic, normocephalic.  Oropharynx and nasopharynx clear.  NECK:  Supple, no jugular venous distention. No thyroid enlargement, no tenderness.  LUNGS: Normal breath sounds bilaterally, no wheezing, rales, rhonchi. No use of accessory muscles of respiration.  CARDIOVASCULAR: S1, S2 normal. No murmurs, rubs, or gallops.  ABDOMEN: Soft, diffuse tenderness, nondistended.few Bowel sounds present. No organomegaly or mass.  EXTREMITIES: No cyanosis, clubbing or edema b/l.    NEUROLOGIC: Cranial nerves II through XII are intact. No focal Motor or sensory deficits b/l.   PSYCHIATRIC:  patient is alert and oriented x 3.  SKIN: No obvious rash, lesion, or ulcer.   LABORATORY PANEL:  CBC  Recent Labs Lab 01/09/16 2325  01/12/16 1817  WBC 8.7  --   --   HGB 6.3*  < > 7.3*  HCT 18.7*  < > 21.6*  PLT 274  --   --   < > = values in this interval not displayed.  Chemistries   Recent Labs Lab 01/07/16 1425  01/10/16 0629  NA 141  < > 139  K 4.0  < > 4.2  CL 112*  < > 115*  CO2 21*  < > 21*  GLUCOSE 114*  < > 121*  BUN 25*  < > 12  CREATININE 1.48*  < > 0.82  CALCIUM 8.6*  < > 7.5*  AST 19  --   --   ALT 16  --   --   ALKPHOS 124  --   --   BILITOT 0.4  --   --   < > = values in this interval not displayed. Cardiac Enzymes  Recent Labs Lab 01/09/16 2325  TROPONINI <0.03   RADIOLOGY:  Nm Gi Blood Loss  01/12/2016  CLINICAL DATA:  Bright red blood per rectum EXAM: NUCLEAR MEDICINE GASTROINTESTINAL BLEEDING SCAN TECHNIQUE: Sequential abdominal images were obtained following intravenous administration of Tc-88m labeled red blood cells. RADIOPHARMACEUTICALS:  19.44 mCi Tc-84m in-vitro labeled red cells. COMPARISON:  CT 12/31/2010 FINDINGS: Active extravasation in the splenic flexure/proximal descending region of the colon. On subsequent images there is further colonic activity spreading retrograde to the hepatic flexure and distally to the sigmoid. IMPRESSION: 1. Active bleed in the splenic flexure/proximal  descending region of the colon. Critical Value/emergent results were called by telephone at the time of interpretation on 01/12/2016 at 9:20 am to Dr. Fritzi Mandes , who verbally acknowledged these results. Electronically Signed   By: Lucrezia Europe M.D.   On: 01/12/2016 09:24   ASSESSMENT AND PLAN:   Melanie Bowen is a 55 y.o. female who presents with Abdominal pain and melena. Patient was seen here in the ED a couple of days ago with a complaint of GI bleed. Her hemoglobin was stable at that time. It was felt that the spleen was potentially due to hemorrhoids, and she was sent home. She returns today with a feeling of shortness of breath, fatigue, persistent abdominal pain, and recurrent bloody stool. Hemoglobin here today is 6, it was 12 two days ago.  1.GI bleed - upper or lower, though her history of recent surgery, emotional stress, and some ibuprofen use might seem to indicate upper GI process.  -on protonix drip, fluids to support her blood pressure, keep her nothing by mouth - GI consult with Dr Candace Cruise appreciated. - EGD normal endoscopy -6.3--2 units--6.6--2 units--7.0--1 unit--7.1---7.3---6.3---1 unit--7.3 (total 6 units) -positive bleeding scan. - pt underwent microbead embolization on tertiary  IMA branch into splenic flexure  by Dr [name] on 01/12/16 -xray KUB  2. HTN (hypertension) - currently stable, though normotensive so hold home antihypertensives for now.  3. HIV (human immunodeficiency virus infection) ([place]) - continue home dose HAART therapy  4.GERD (gastroesophageal reflux disease) - PPI drip as above  5. Depression - continue home meds  6. Difficult IV access s/p pICC placement  Case discussed with Care Management/Social Worker. Management plans discussed with the patient, family and they are in agreement.  CODE STATUS: full  DVT Prophylaxis: scd  TOTAL TIME TAKING CARE OF THIS PATIENT: 30 minutes.  >50% time spent on counselling and coordination of care  POSSIBLE D/C IN  1-2 DAYS, DEPENDING ON CLINICAL CONDITION.  Note: This dictation was prepared with Dragon dictation along with smaller phrase technology. Any transcriptional errors that result from this process are unintentional.  Johnthan Axtman M.D on 01/13/2016 at 7:51 AM  Between 7am to 6pm - Pager - [phone]  After 6pm go to [email or web address] - password [email or web address] [facility]  Office  [phone]  CC: Primary care physician; [facility]

## 2016-01-13 NOTE — Progress Notes (Signed)
Pt reported cramping in hands and stomach. MD was made aware. No further orders at this time.

## 2016-01-14 ENCOUNTER — Encounter: Payer: Self-pay | Admitting: Vascular Surgery

## 2016-01-14 MED ORDER — OMEPRAZOLE 40 MG PO CPDR: 40.0000 mg | DELAYED_RELEASE_CAPSULE | Freq: Two times a day (BID) | ORAL | Status: DC

## 2016-01-14 NOTE — Discharge Summary (Signed)
Melanie Bowen at [place] NAME: Melanie Bowen    MR#:  [id number]  DATE OF BIRTH:  Jan 15, 1961  DATE OF ADMISSION:  01/10/2016 ADMITTING PHYSICIAN: Lance Coon, MD  DATE OF DISCHARGE: 01/14/2016  PRIMARY CARE PHYSICIAN: [facility]    ADMISSION DIAGNOSIS:  Lower GI bleed [K92.2] Symptomatic anemia [D64.9]  DISCHARGE DIAGNOSIS:  Principal Problem:   GI bleed Active Problems:   GERD (gastroesophageal reflux disease)   HTN (hypertension)   Depression   HIV (human immunodeficiency virus infection) ([place])  SECONDARY DIAGNOSIS:   Past Medical History  Diagnosis Date  . Asthma   . Cough   . Genital herpes   . HIV (human immunodeficiency virus infection) ([place])   . Bronchitis   . GERD (gastroesophageal reflux disease)   . Eye irritation   . Paresthesia of hand, bilateral   . Blepharitis   . Allergic rhinitis   . Hypertension   . Cigarette smoker   . Headache   . Shortness of breath dyspnea     at times due to asthma  . Depression   . Arthritis     DDD  . Claustrophobia    HOSPITAL COURSE:  Melanie Bowen is a 55 y.o. female who presents with Abdominal pain and melena.   1.GI bleed - upper or lower, though her history of recent surgery, emotional stress, and some ibuprofen use might seem to indicate upper GI process.  - EGD normal endoscopy - s/p 6 units of PRBC transfusion and Hb 7.2. No further bleeding but remains at high risk -positive bleeding scan s/p microbead embolization on tertiaryIMA branch into splenic flexureby Dr [name] on 01/12/16 - stable since then.  2. HTN (hypertension) - currently stable, though normotensive so hold home antihypertensives for now.  3. HIV (human immunodeficiency virus infection) ([place]) - continue home dose HAART therapy  4.GERD (gastroesophageal reflux disease) - on PPI   5. Depression - stable on home meds  Patient is very adamant in wanting to go home.  I  feel she may benefit from staying in-house and recheck a hemoglobin to consider transfusion if hemoglobin close to or lower than 7.  Also, considering she has appointment with orthopedic at 10:00 at [facility] and her desire to go home.  I have highly recommended that she follows up with her primary and recheck a blood count.  Sometimes this week. She was also instructed to watch for any signs of bleeding. DISCHARGE CONDITIONS:  Stable CONSULTS OBTAINED:  Treatment Team:  Hulen Luster, MD Algernon Huxley, MD  DRUG ALLERGIES:   Allergies  Allergen Reactions  . Morphine And Related Other (See Comments)    itching  . Gabapentin Rash  . Zanaflex  [Tizanidine] Rash    DISCHARGE MEDICATIONS:   Current Discharge Medication List    CONTINUE these medications which have CHANGED   Details  omeprazole (PRILOSEC) 40 MG capsule Take 1 capsule (40 mg total) by mouth 2 (two) times daily. Qty: 60 capsule, Refills: 0      CONTINUE these medications which have NOT CHANGED   Details  albuterol (PROVENTIL HFA;VENTOLIN HFA) 108 (90 BASE) MCG/ACT inhaler Inhale 90 mcg into the lungs every 6 (six) hours as needed for wheezing or shortness of breath. Reported on 01/10/2016    amLODipine (NORVASC) 10 MG tablet Take 10 mg by mouth daily before breakfast.     docusate sodium (COLACE) 100 MG capsule Take 2 capsules (200 mg total) by  mouth 2 (two) times daily. Qty: 120 capsule, Refills: 0    efavirenz-emtricitabine-tenofovir (ATRIPLA) [id number] MG per tablet Take 1 tablet by mouth at bedtime.    fluticasone (FLONASE) 50 MCG/ACT nasal spray Place 50 g into the nose daily as needed for allergies or rhinitis.     Fluticasone-Salmeterol (ADVAIR) 500-50 MCG/DOSE AEPB Inhale 500 mcg into the lungs 2 (two) times daily.     ipratropium-albuterol (DUONEB) 0.5-2.5 (3) MG/3ML SOLN Inhale 3 mLs into the lungs 3 (three) times daily as needed (shortness of breath, wheezing).     losartan (COZAAR) 50 MG tablet Take 50 mg  by mouth daily.    oxyCODONE (ROXICODONE) 15 MG immediate release tablet Take 15 mg by mouth every 6 (six) hours as needed for pain.    pramoxine (PROCTOFOAM) 1 % foam Place 1 application rectally 3 (three) times daily as needed for itching. Qty: 15 g, Refills: 0    promethazine (PHENERGAN) 25 MG tablet Take 25 mg by mouth as needed for nausea or vomiting.     valACYclovir (VALTREX) 1000 MG tablet Take 1,000 mg by mouth at bedtime.     Witch Hazel (TUCKS) 50 % PADS Apply 1 application topically every 2 (two) hours as needed. Qty: 40 each, Refills: 0      STOP taking these medications     hydrochlorothiazide (HYDRODIURIL) 25 MG tablet          DISCHARGE INSTRUCTIONS:    DIET:  Regular diet  DISCHARGE CONDITION:  Good  ACTIVITY:  Activity as tolerated  OXYGEN:  Home Oxygen: No.   Oxygen Delivery: room air  DISCHARGE LOCATION:  home   If you experience worsening of your admission symptoms, develop shortness of breath, life threatening emergency, suicidal or homicidal thoughts you must seek medical attention immediately by calling 911 or calling your MD immediately  if symptoms less severe.  You Must read complete instructions/literature along with all the possible adverse reactions/side effects for all the Medicines you take and that have been prescribed to you. Take any new Medicines after you have completely understood and accpet all the possible adverse reactions/side effects.   Please note  You were cared for by a hospitalist during your hospital stay. If you have any questions about your discharge medications or the care you received while you were in the hospital after you are discharged, you can call the unit and asked to speak with the hospitalist on call if the hospitalist that took care of you is not available. Once you are discharged, your primary care physician will handle any further medical issues. Please note that NO REFILLS for any discharge medications  will be authorized once you are discharged, as it is imperative that you return to your primary care physician (or establish a relationship with a primary care physician if you do not have one) for your aftercare needs so that they can reassess your need for medications and monitor your lab values.    On the day of Discharge:  VITAL SIGNS:  Blood pressure 123/66, pulse 109, temperature 98.7 F (37.1 C), temperature source Oral, resp. rate 19, height 5\' 6"  (1.676 m), weight 121.655 kg (268 lb 3.2 oz), SpO2 98 %.  PHYSICAL EXAMINATION:  GENERAL:  55 y.o.-year-old patient lying in the bed with no acute distress.  EYES: Pupils equal, round, reactive to light and accommodation. No scleral icterus. Extraocular muscles intact.  HEENT: Head atraumatic, normocephalic. Oropharynx and nasopharynx clear.  NECK:  Supple, no jugular venous distention. No  thyroid enlargement, no tenderness.  LUNGS: Normal breath sounds bilaterally, no wheezing, rales,rhonchi or crepitation. No use of accessory muscles of respiration.  CARDIOVASCULAR: S1, S2 normal. No murmurs, rubs, or gallops.  ABDOMEN: Soft, non-tender, non-distended. Bowel sounds present. No organomegaly or mass.  EXTREMITIES: No pedal edema, cyanosis, or clubbing.  NEUROLOGIC: Cranial nerves II through XII are intact. Muscle strength 5/5 in all extremities. Sensation intact. Gait not checked.  PSYCHIATRIC: The patient is alert and oriented x 3.  SKIN: No obvious rash, lesion, or ulcer.  DATA REVIEW:   CBC  Recent Labs Lab 01/09/16 2325  01/12/16 1817 01/13/16 0914  WBC 8.7  --   --   --   HGB 6.3*  < > 7.3* 7.2*  HCT 18.7*  < > 21.6*  --   PLT 274  --   --   --   < > = values in this interval not displayed.  Chemistries   Recent Labs Lab 01/07/16 1425  01/10/16 0629  NA 141  < > 139  K 4.0  < > 4.2  CL 112*  < > 115*  CO2 21*  < > 21*  GLUCOSE 114*  < > 121*  BUN 25*  < > 12  CREATININE 1.48*  < > 0.82  CALCIUM 8.6*  < > 7.5*   AST 19  --   --   ALT 16  --   --   ALKPHOS 124  --   --   BILITOT 0.4  --   --   < > = values in this interval not displayed.  Cardiac Enzymes  Recent Labs Lab 01/09/16 2325  TROPONINI <0.03    Microbiology Results  Results for orders placed or performed during the hospital encounter of 01/10/16  MRSA PCR Screening     Status: None   Collection Time: 01/10/16  2:35 PM  Result Value Ref Range Status   MRSA by PCR NEGATIVE NEGATIVE Final    Comment:        The GeneXpert MRSA Assay (FDA approved for NASAL specimens only), is one component of a comprehensive MRSA colonization surveillance program. It is not intended to diagnose MRSA infection nor to guide or monitor treatment for MRSA infections.     Management plans discussed with the patient, family and they are in agreement.  CODE STATUS: Full code   Follow-up Information    Follow up with [facility]. Schedule an appointment as soon as possible for a visit in 3 days.   Why:  [facility] Discharge F/UP   Contact information:   [address] [place] [address] [phone]       Follow up with DEW,JASON, MD. Schedule an appointment as soon as possible for a visit in 2 weeks.   Specialties:  Vascular Surgery, Radiology, Interventional Cardiology   Why:  [facility] Discharge F/UP   Contact information:   [place] [address] [phone]       Follow up with OH, Lupita Dawn, MD. Schedule an appointment as soon as possible for a visit in 1 week.   Specialty:  Internal Medicine   Why:  [facility] Discharge F/UP   Contact information:   [place] [address] [phone]       TOTAL TIME TAKING CARE OF THIS PATIENT: 45 minutes.    [facility], Lundynn Cohoon M.D on 01/14/2016 at 9:10 AM  Between 7am to 6pm - Pager - [phone]  After 6pm go to [email or web address] - password [email or web address] [place] [facility]  Office   [phone]  CC: Primary care physician; [facility]   Note: This dictation was prepared with Dragon dictation along with smaller phrase technology. Any transcriptional errors that result from this process are unintentional.

## 2016-01-14 NOTE — Discharge Instructions (Signed)
Gastrointestinal Bleeding °Gastrointestinal bleeding is bleeding somewhere along the path that food travels through the body (digestive tract). This path is anywhere between the mouth and the opening of the butt (anus). You may have blood in your throw up (vomit) or in your poop (stools). If there is a lot of bleeding, you may need to stay in the hospital. °HOME CARE °· Only take medicine as told by your doctor. °· Eat foods with fiber such as whole grains, fruits, and vegetables. You can also try eating 1 to 3 prunes a day. °· Drink enough fluids to keep your pee (urine) clear or pale yellow. °GET HELP RIGHT AWAY IF:  °· Your bleeding gets worse. °· You feel dizzy, weak, or you pass out (faint). °· You have bad cramps in your back or belly (abdomen). °· You have large blood clumps (clots) in your poop. °· Your problems are getting worse. °MAKE SURE YOU:  °· Understand these instructions. °· Will watch your condition. °· Will get help right away if you are not doing well or get worse. °  °This information is not intended to replace advice given to you by your health care provider. Make sure you discuss any questions you have with your health care provider. °  °Document Released: 05/25/2008 Document Revised: 08/02/2012 Document Reviewed: 02/03/2015 °Elsevier Interactive Patient Education ©2016 Elsevier Inc. ° °

## 2016-01-14 NOTE — Progress Notes (Signed)
Jennings at [place] was admitted to the Hospital on 01/10/2016 and Discharged  01/14/2016 and her fianc was in the hospital through the course and should be excused from work for 4 days starting 01/10/2016 , may return to work without any restrictions.  Max Sane M.D on 01/14/2016,at 9:17 AM  [place] at [facility]  [phone]

## 2016-01-14 NOTE — Progress Notes (Signed)
D/C home with female friend.D/C teaching done, questions answered and copies of D/C instructions given to take home. Right upper arm PICC line d/ced per protocol. Marland Kitchen

## 2016-01-15 ENCOUNTER — Ambulatory Visit: Payer: Medicare Other | Admitting: Internal Medicine

## 2016-01-15 LAB — TYPE AND SCREEN
ABO/RH(D): O POS
Antibody Screen: NEGATIVE
Unit division: 0
Unit division: 0
Unit division: 0
Unit division: 0
Unit division: 0
Unit division: 0

## 2016-01-16 ENCOUNTER — Observation Stay
Admission: EM | Admit: 2016-01-16 | Discharge: 2016-01-18 | Disposition: A | Discharge status: Home / Self Care | Payer: Medicare Other | Attending: Internal Medicine | Admitting: Internal Medicine

## 2016-01-16 DIAGNOSIS — Z79899 Other long term (current) drug therapy: Secondary | ICD-10-CM | POA: Diagnosis not present

## 2016-01-16 DIAGNOSIS — F329 Major depressive disorder, single episode, unspecified: Secondary | ICD-10-CM | POA: Diagnosis not present

## 2016-01-16 DIAGNOSIS — Z803 Family history of malignant neoplasm of breast: Secondary | ICD-10-CM | POA: Insufficient documentation

## 2016-01-16 DIAGNOSIS — J309 Allergic rhinitis, unspecified: Secondary | ICD-10-CM | POA: Insufficient documentation

## 2016-01-16 DIAGNOSIS — Z96653 Presence of artificial knee joint, bilateral: Secondary | ICD-10-CM | POA: Diagnosis not present

## 2016-01-16 DIAGNOSIS — I10 Essential (primary) hypertension: Secondary | ICD-10-CM | POA: Diagnosis not present

## 2016-01-16 DIAGNOSIS — M1990 Unspecified osteoarthritis, unspecified site: Secondary | ICD-10-CM | POA: Insufficient documentation

## 2016-01-16 DIAGNOSIS — F17210 Nicotine dependence, cigarettes, uncomplicated: Secondary | ICD-10-CM | POA: Diagnosis not present

## 2016-01-16 DIAGNOSIS — J45909 Unspecified asthma, uncomplicated: Secondary | ICD-10-CM | POA: Diagnosis not present

## 2016-01-16 DIAGNOSIS — K922 Gastrointestinal hemorrhage, unspecified: Secondary | ICD-10-CM | POA: Diagnosis not present

## 2016-01-16 DIAGNOSIS — Z888 Allergy status to other drugs, medicaments and biological substances status: Secondary | ICD-10-CM | POA: Diagnosis not present

## 2016-01-16 DIAGNOSIS — M5136 Other intervertebral disc degeneration, lumbar region: Secondary | ICD-10-CM | POA: Diagnosis not present

## 2016-01-16 DIAGNOSIS — Z7952 Long term (current) use of systemic steroids: Secondary | ICD-10-CM | POA: Diagnosis not present

## 2016-01-16 DIAGNOSIS — K219 Gastro-esophageal reflux disease without esophagitis: Secondary | ICD-10-CM | POA: Diagnosis not present

## 2016-01-16 DIAGNOSIS — R05 Cough: Secondary | ICD-10-CM | POA: Diagnosis not present

## 2016-01-16 DIAGNOSIS — Z21 Asymptomatic human immunodeficiency virus [HIV] infection status: Secondary | ICD-10-CM | POA: Insufficient documentation

## 2016-01-16 DIAGNOSIS — R51 Headache: Secondary | ICD-10-CM | POA: Insufficient documentation

## 2016-01-16 DIAGNOSIS — F40240 Claustrophobia: Secondary | ICD-10-CM | POA: Diagnosis not present

## 2016-01-16 DIAGNOSIS — E876 Hypokalemia: Secondary | ICD-10-CM | POA: Diagnosis present

## 2016-01-16 DIAGNOSIS — R109 Unspecified abdominal pain: Secondary | ICD-10-CM | POA: Diagnosis not present

## 2016-01-16 DIAGNOSIS — D649 Anemia, unspecified: Secondary | ICD-10-CM | POA: Insufficient documentation

## 2016-01-16 DIAGNOSIS — R531 Weakness: Secondary | ICD-10-CM | POA: Insufficient documentation

## 2016-01-16 LAB — URINALYSIS COMPLETE WITH MICROSCOPIC (ARMC ONLY)
Bacteria, UA: NONE SEEN
Bilirubin Urine: NEGATIVE
Glucose, UA: NEGATIVE mg/dL
Hgb urine dipstick: NEGATIVE
Ketones, ur: NEGATIVE mg/dL
Leukocytes, UA: NEGATIVE
Nitrite: NEGATIVE
Protein, ur: NEGATIVE mg/dL
Specific Gravity, Urine: 1.015 (ref 1.005–1.030)
pH: 5 (ref 5.0–8.0)

## 2016-01-16 LAB — CBC
HCT: 20.5 % — ABNORMAL LOW (ref 35.0–47.0)
Hemoglobin: 6.7 g/dL — ABNORMAL LOW (ref 12.0–16.0)
MCH: 28.8 pg (ref 26.0–34.0)
MCHC: 32.4 g/dL (ref 32.0–36.0)
MCV: 88.9 fL (ref 80.0–100.0)
Platelets: 406 10*3/uL (ref 150–440)
RBC: 2.31 MIL/uL — ABNORMAL LOW (ref 3.80–5.20)
RDW: 17.1 % — ABNORMAL HIGH (ref 11.5–14.5)
WBC: 15.1 10*3/uL — ABNORMAL HIGH (ref 3.6–11.0)

## 2016-01-16 LAB — BASIC METABOLIC PANEL
Anion gap: 7 (ref 5–15)
BUN: 6 mg/dL (ref 6–20)
CO2: 24 mmol/L (ref 22–32)
Calcium: 7.3 mg/dL — ABNORMAL LOW (ref 8.9–10.3)
Chloride: 101 mmol/L (ref 101–111)
Creatinine, Ser: 0.82 mg/dL (ref 0.44–1.00)
GFR calc Af Amer: >60 mL/min (ref >60)
GFR calc non Af Amer: >60 mL/min (ref >60)
Glucose, Bld: 116 mg/dL — ABNORMAL HIGH (ref 65–99)
Potassium: 2.4 mmol/L — CL (ref 3.5–5.1)
Sodium: 132 mmol/L — ABNORMAL LOW (ref 135–145)

## 2016-01-16 LAB — PREPARE RBC (CROSSMATCH)

## 2016-01-16 MED ORDER — POTASSIUM CHLORIDE 10 MEQ/100ML IV SOLN
10.0000 meq | INTRAVENOUS | Status: AC
  Administered 2016-01-16 (×3): 10 meq via INTRAVENOUS
  Filled 2016-01-16 (×3): qty 100

## 2016-01-16 MED ORDER — SODIUM CHLORIDE 0.9 % IV SOLN
10.0000 mL/h | Freq: Once | INTRAVENOUS | Status: AC
  Administered 2016-01-16: 10 mL/h via INTRAVENOUS

## 2016-01-16 MED ORDER — PANTOPRAZOLE SODIUM 40 MG PO TBEC
40.0000 mg | DELAYED_RELEASE_TABLET | Freq: Every day | ORAL | Status: DC
  Administered 2016-01-17 – 2016-01-18 (×2): 40 mg via ORAL
  Filled 2016-01-16 (×2): qty 1

## 2016-01-16 MED ORDER — EFAVIRENZ-EMTRICITAB-TENOFOVIR 600-200-300 MG PO TABS
1.0000 | ORAL_TABLET | Freq: Every day | ORAL | Status: DC
  Administered 2016-01-16 – 2016-01-17 (×2): 1 via ORAL
  Filled 2016-01-16 (×3): qty 1

## 2016-01-16 MED ORDER — HYDROMORPHONE HCL 1 MG/ML IJ SOLN
0.2500 mg | Freq: Once | INTRAMUSCULAR | Status: AC
  Administered 2016-01-16: 0.25 mg via INTRAVENOUS
  Filled 2016-01-16: qty 1

## 2016-01-16 MED ORDER — POLYETHYLENE GLYCOL 3350 17 G PO PACK
17.0000 g | PACK | Freq: Every day | ORAL | Status: DC | PRN
  Administered 2016-01-17: 17 g via ORAL
  Filled 2016-01-16: qty 1

## 2016-01-16 MED ORDER — DICYCLOMINE HCL 10 MG PO CAPS
10.0000 mg | ORAL_CAPSULE | Freq: Three times a day (TID) | ORAL | Status: DC | PRN
  Administered 2016-01-17 – 2016-01-18 (×3): 10 mg via ORAL
  Filled 2016-01-16 (×2): qty 1

## 2016-01-16 MED ORDER — SODIUM CHLORIDE 0.9% FLUSH: 3.0000 mL | INTRAVENOUS | Status: DC | PRN

## 2016-01-16 MED ORDER — SODIUM CHLORIDE 0.9 % IV SOLN: 250.0000 mL | INTRAVENOUS | Status: DC | PRN

## 2016-01-16 MED ORDER — LOSARTAN POTASSIUM 25 MG PO TABS
50.0000 mg | ORAL_TABLET | Freq: Every day | ORAL | Status: DC
  Administered 2016-01-18: 11:00:00 50 mg via ORAL
  Filled 2016-01-16 (×2): qty 2

## 2016-01-16 MED ORDER — AMLODIPINE BESYLATE 5 MG PO TABS
10.0000 mg | ORAL_TABLET | Freq: Every day | ORAL | Status: DC
  Administered 2016-01-18: 10 mg via ORAL
  Filled 2016-01-16 (×2): qty 2

## 2016-01-16 MED ORDER — POTASSIUM CHLORIDE CRYS ER 20 MEQ PO TBCR
40.0000 meq | EXTENDED_RELEASE_TABLET | ORAL | Status: AC
  Administered 2016-01-16 (×2): 40 meq via ORAL
  Filled 2016-01-16 (×2): qty 2

## 2016-01-16 MED ORDER — HYDROCODONE-ACETAMINOPHEN 5-325 MG PO TABS
1.0000 | ORAL_TABLET | ORAL | Status: DC | PRN
  Administered 2016-01-17 (×2): 2 via ORAL
  Filled 2016-01-16 (×4): qty 2

## 2016-01-16 MED ORDER — ONDANSETRON HCL 4 MG/2ML IJ SOLN
4.0000 mg | Freq: Once | INTRAMUSCULAR | Status: AC
  Administered 2016-01-16: 4 mg via INTRAVENOUS
  Filled 2016-01-16: qty 2

## 2016-01-16 MED ORDER — ONDANSETRON HCL 4 MG/2ML IJ SOLN: 4.0000 mg | Freq: Four times a day (QID) | INTRAMUSCULAR | Status: DC | PRN

## 2016-01-16 MED ORDER — SODIUM CHLORIDE 0.9% FLUSH
3.0000 mL | Freq: Two times a day (BID) | INTRAVENOUS | Status: DC
  Administered 2016-01-16 – 2016-01-18 (×4): 3 mL via INTRAVENOUS

## 2016-01-16 MED ORDER — MOMETASONE FURO-FORMOTEROL FUM 200-5 MCG/ACT IN AERO
2.0000 | INHALATION_SPRAY | Freq: Two times a day (BID) | RESPIRATORY_TRACT | Status: DC
  Administered 2016-01-16 – 2016-01-18 (×4): 2 via RESPIRATORY_TRACT
  Filled 2016-01-16: qty 8.8

## 2016-01-16 MED ORDER — IPRATROPIUM-ALBUTEROL 0.5-2.5 (3) MG/3ML IN SOLN: 3.0000 mL | Freq: Four times a day (QID) | RESPIRATORY_TRACT | Status: DC | PRN

## 2016-01-16 MED ORDER — HYDROMORPHONE HCL 1 MG/ML IJ SOLN
1.0000 mg | Freq: Once | INTRAMUSCULAR | Status: AC
Start: 2016-01-16 — End: 2016-01-16
  Administered 2016-01-16: 1 mg via INTRAVENOUS
  Filled 2016-01-16: qty 1

## 2016-01-16 MED ORDER — VALACYCLOVIR HCL 500 MG PO TABS
500.0000 mg | ORAL_TABLET | Freq: Two times a day (BID) | ORAL | Status: DC
  Administered 2016-01-16 – 2016-01-18 (×4): 500 mg via ORAL
  Filled 2016-01-16 (×4): qty 1

## 2016-01-16 MED ORDER — PROMETHAZINE HCL 25 MG PO TABS: 25.0000 mg | ORAL_TABLET | Freq: Four times a day (QID) | ORAL | Status: DC | PRN

## 2016-01-16 MED ORDER — ALBUTEROL SULFATE (2.5 MG/3ML) 0.083% IN NEBU: 2.5000 mg | INHALATION_SOLUTION | RESPIRATORY_TRACT | Status: DC | PRN

## 2016-01-16 MED ORDER — ACETAMINOPHEN 325 MG PO TABS: 650.0000 mg | ORAL_TABLET | Freq: Four times a day (QID) | ORAL | Status: DC | PRN

## 2016-01-16 MED ORDER — MUPIROCIN 2 % EX OINT
1.0000 "application " | TOPICAL_OINTMENT | Freq: Two times a day (BID) | CUTANEOUS | Status: DC | PRN
  Filled 2016-01-16: qty 22

## 2016-01-16 MED ORDER — ONDANSETRON HCL 4 MG PO TABS: 4.0000 mg | ORAL_TABLET | Freq: Four times a day (QID) | ORAL | Status: DC | PRN

## 2016-01-16 MED ORDER — POTASSIUM CHLORIDE CRYS ER 20 MEQ PO TBCR
40.0000 meq | EXTENDED_RELEASE_TABLET | Freq: Once | ORAL | Status: AC
  Administered 2016-01-16: 40 meq via ORAL
  Filled 2016-01-16: qty 2

## 2016-01-16 MED ORDER — ACETAMINOPHEN 650 MG RE SUPP: 650.0000 mg | Freq: Four times a day (QID) | RECTAL | Status: DC | PRN

## 2016-01-16 MED ORDER — OXYCODONE HCL 5 MG PO TABS
15.0000 mg | ORAL_TABLET | Freq: Four times a day (QID) | ORAL | Status: DC | PRN
  Administered 2016-01-16 – 2016-01-18 (×7): 15 mg via ORAL
  Filled 2016-01-16 (×7): qty 3

## 2016-01-16 NOTE — ED Provider Notes (Signed)
[facility] Emergency Department Provider Note   ____________________________________________  Time seen: Approximately 3:30 PM I have reviewed the triage vital signs and the triage nursing note.  HISTORY  Chief Complaint Weakness   Historian Patient  HPI Melanie Bowen is a 55 y.o. female with a history of recent GI bleed which was embolized, for which she received numerous red blood cell transfusions, is here for evaluation of a complaint of weakness for a few days.  [facility] 2 days ago. Denies new bowel changes. She does have lower abdominal cramping as well as epigastric burning which seems like prior GERD. States she was not on her acid reflux medication in the hospital nor her hydrochlorothiazide.  Symptoms are moderate. Nothing seems to make worse or better.    Past Medical History  Diagnosis Date  . Asthma   . Cough   . Genital herpes   . HIV (human immunodeficiency virus infection) ([place])   . Bronchitis   . GERD (gastroesophageal reflux disease)   . Eye irritation   . Paresthesia of hand, bilateral   . Blepharitis   . Allergic rhinitis   . Hypertension   . Cigarette smoker   . Headache   . Shortness of breath dyspnea     at times due to asthma  . Depression   . Arthritis     DDD  . Claustrophobia     Patient Active Problem List   Diagnosis Date Noted  . Anemia 01/16/2016  . GI bleed 01/10/2016  . GERD (gastroesophageal reflux disease) 01/10/2016  . HTN (hypertension) 01/10/2016  . Depression 01/10/2016  . HIV (human immunodeficiency virus infection) ([place]) 01/10/2016  . Lumbar pseudoarthrosis 10/31/2015  . Chronic cough   . Cough 06/19/2015  . DDD (degenerative disc disease), lumbar 12/31/2014  . Degenerative disc disease, lumbar 12/31/2014  . Asthma, chronic 11/12/2014    Past Surgical History  Procedure Laterality Date  . Knee surgery Bilateral 2003,2007    total Joint  . Hand surgery Bilateral     carpal  tunnel  . Tubal ligation    . Laparoscopy for ectopic pregnancy    . Bunionectomy Bilateral     feet  . Finger fracture Right     5th finger  . Back surgery may 2016      lumbar   . Video bronchoscopy N/A 06/30/2015    Procedure: VIDEO BRONCHOSCOPY WITHOUT FLUORO;  Surgeon: Vilinda Boehringer, MD;  Location: [facility] ORS;  Service: Cardiopulmonary;  Laterality: N/A;  . Diagnostic laparoscopy    . Back surgery    . Joint replacement Bilateral     knees  . Peripheral vascular catheterization N/A 01/12/2016    Procedure: Visceral Angiography;  Surgeon: Algernon Huxley, MD;  Location: [place] CV LAB;  Service: Cardiovascular;  Laterality: N/A;  . Peripheral vascular catheterization N/A 01/12/2016    Procedure: Visceral Artery Intervention;  Surgeon: Algernon Huxley, MD;  Location: [place] CV LAB;  Service: Cardiovascular;  Laterality: N/A;  . Esophagogastroduodenoscopy Left 01/11/2016    Procedure: ESOPHAGOGASTRODUODENOSCOPY (EGD);  Surgeon: Hulen Luster, MD;  Location: [facility] ENDOSCOPY;  Service: Endoscopy;  Laterality: Left;    Current Outpatient Rx  Name  Route  Sig  Dispense  Refill  . albuterol (PROVENTIL HFA;VENTOLIN HFA) 108 (90 BASE) MCG/ACT inhaler   Inhalation   Inhale 2 puffs into the lungs every 6 (six) hours as needed for wheezing or shortness of breath.          Marland Kitchen  amLODipine (NORVASC) 10 MG tablet   Oral   Take 10 mg by mouth daily.          Marland Kitchen efavirenz-emtricitabine-tenofovir (ATRIPLA) 600-200-300 MG per tablet   Oral   Take 1 tablet by mouth at bedtime.         . Fluticasone-Salmeterol (ADVAIR) 250-50 MCG/DOSE AEPB   Inhalation   Inhale 1 puff into the lungs 2 (two) times daily.         . hydrochlorothiazide (HYDRODIURIL) 25 MG tablet   Oral   Take 25 mg by mouth daily.         Marland Kitchen ipratropium-albuterol (DUONEB) 0.5-2.5 (3) MG/3ML SOLN   Inhalation   Inhale 3 mLs into the lungs every 6 (six) hours as needed (for wheezing/shortness of breath).          .  losartan (COZAAR) 50 MG tablet   Oral   Take 50 mg by mouth daily.         . mupirocin ointment (BACTROBAN) 2 %   Topical   Apply 1 application topically 2 (two) times daily as needed (for rash).          Marland Kitchen omeprazole (PRILOSEC) 40 MG capsule   Oral   Take 40 mg by mouth 2 (two) times daily as needed (for acid reflux).         Marland Kitchen oxyCODONE (ROXICODONE) 15 MG immediate release tablet   Oral   Take 15 mg by mouth every 6 (six) hours as needed for pain.         . promethazine (PHENERGAN) 25 MG tablet   Oral   Take 25 mg by mouth every 6 (six) hours as needed for nausea or vomiting.          . valACYclovir (VALTREX) 500 MG tablet   Oral   Take 500 mg by mouth 2 (two) times daily.           Allergies Morphine and related; Gabapentin; and Zanaflex   Family History  Problem Relation Age of Onset  . Breast cancer Sister     Social History Social History  Substance Use Topics  . Smoking status: Light Tobacco Smoker -- 0.20 packs/day for 38 years    Types: Cigarettes  . Smokeless tobacco: Never Used  . Alcohol Use: 3.0 oz/week    5 Glasses of wine, 0 Standard drinks or equivalent per week     Comment: wine - couple days of week    Review of Systems  Constitutional: Negative for fever. Eyes: Negative for visual changes. ENT: Negative for sore throat. Cardiovascular: Negative for chest pain. Respiratory: Negative for shortness of breath. Gastrointestinal:Epigastric burning, lower abdominal cramping. Genitourinary: Negative for dysuria. Musculoskeletal: Negative for back pain. Skin: Negative for rash. Neurological: Negative for headache. 10 point Review of Systems otherwise negative ____________________________________________   PHYSICAL EXAM:  VITAL SIGNS: ED Triage Vitals  Enc Vitals Group     BP 01/16/16 1407 99/62 mmHg     Pulse Rate 01/16/16 1407 94     Resp 01/16/16 1407 18     Temp 01/16/16 1407 98.4 F (36.9 C)     Temp Source 01/16/16 1407  Oral     SpO2 01/16/16 1407 99 %     Weight 01/16/16 1407 267 lb (121.11 kg)     Height 01/16/16 1407 5\' 6"  (1.676 m)     Head Cir --      Peak Flow --      Pain Score 01/16/16 1408  10     Pain Loc --      Pain Edu? --      Excl. in [place]? --      Constitutional: Alert and oriented. Well appearing and in no distress. HEENT   Head: Normocephalic and atraumatic.      Eyes: Conjunctivae are normal. PERRL. Normal extraocular movements.      Ears:         Nose: No congestion/rhinnorhea.   Mouth/Throat: Mucous membranes are moist.   Neck: No stridor. Cardiovascular/Chest: Normal rate, regular rhythm.  No murmurs, rubs, or gallops. Respiratory: Normal respiratory effort without tachypnea nor retractions. Breath sounds are clear and equal bilaterally. No wheezes/rales/rhonchi. Gastrointestinal: Soft. No distention, no guarding, no rebound. Mild tenderness to palpation epigastrium and suprapubically.  Genitourinary/rectal:Deferred Musculoskeletal: Nontender with normal range of motion in all extremities. No joint effusions.  No lower extremity tenderness.  No edema. Neurologic:  Normal speech and language. No gross or focal neurologic deficits are appreciated. Skin:  Skin is warm, dry and intact. No rash noted. Psychiatric: Mood and affect are normal. Speech and behavior are normal. Patient exhibits appropriate insight and judgment.  ____________________________________________   EKG I, Lisa Roca, MD, the attending physician have personally viewed and interpreted all ECGs.  95 bpm. normal sinus rhythm. Narrow QRS. Normal axis. Normal ST and T-wave ____________________________________________  LABS (pertinent positives/negatives)  Labs Reviewed  BASIC METABOLIC PANEL - Abnormal; Notable for the following:    Sodium 132 (*)    Potassium 2.4 (*)    Glucose, Bld 116 (*)    Calcium 7.3 (*)    All other components within normal limits  CBC - Abnormal; Notable for the  following:    WBC 15.1 (*)    RBC 2.31 (*)    Hemoglobin 6.7 (*)    HCT 20.5 (*)    RDW 17.1 (*)    All other components within normal limits  URINALYSIS COMPLETEWITH MICROSCOPIC ([facility] ONLY)  BASIC METABOLIC PANEL  CBC  PREPARE RBC (CROSSMATCH)  TYPE AND SCREEN     ____________________________________________  RADIOLOGY All Xrays were viewed by me. Imaging interpreted by Radiologist.   None __________________________________________  PROCEDURES  Procedure(s) performed: None  Critical Care performed: None  ____________________________________________   ED COURSE / ASSESSMENT AND PLAN  Pertinent labs & imaging results that were available during my care of the patient were reviewed by me and considered in my medical decision making (see chart for details).    Patient brought back to room after labs indicated anemic to 6.7 and hypokalemic to 2.4.  Hypokalemia likely due to hydrochlorothiazide use in the setting of decreased by mouth intake.  Her discharge hemoglobin was just above 7, and so although the hemoglobin is not more than one point lower, it is below 7, and in the setting of her recent GI bleed, I think it is warranted for an additional unit of packed red blood cell transfusion.  She is not reporting new or changing bowel symptoms or black or bloody stools to make me concerned about a new active GI bleed.  Patient was started on by mouth and IV potassium replacement. Patient will be admitted under observation for blood transfusion as well as treatment of her electrolyte abnormality.    CONSULTATIONS:   Hospitalist for admission   Patient / Family / Caregiver informed of clinical course, medical decision-making process, and agree with plan.   ___________________________________________   FINAL CLINICAL IMPRESSION(S) / ED DIAGNOSES   Final diagnoses:  Hypokalemia  Symptomatic anemia              Note: This dictation was prepared with  Dragon dictation. Any transcriptional errors that result from this process are unintentional   Lisa Roca, MD 01/16/16 1733

## 2016-01-16 NOTE — ED Notes (Signed)
Pt states that she was seen by her dr today and was told that her blood hemoglobin was low again at a 4 per their finger prick. Pt was just recently discharged from having to receive 7 units of blood as well as a procedure performed to stop the bleeding in her large intestine, pt reports that she has been weak, sob with movement, no energy, and lack of appetite

## 2016-01-16 NOTE — H&P (Signed)
[place] [facility] [place] NAME: Melanie Bowen    MR#:  [id number]  DATE OF BIRTH:  March 01, 1961  DATE OF ADMISSION:  01/16/2016  PRIMARY CARE PHYSICIAN: [facility]   REQUESTING/REFERRING PHYSICIAN: Dr. Reita Cliche  CHIEF COMPLAINT:   Chief Complaint  Patient presents with  . Weakness    HISTORY OF PRESENT ILLNESS:  Melanie Bowen  is a 55 y.o. female with a known history of HTN, HIV, Recent GI bleed from splenic flexure of colon status post embolization returns to the emergency room sent in by PCP due to anemia. Patient had 6 units packed RBC transfused during recent admission due to lower GI bleed. Patient was discharged on 01/14/2016 at patient's request although she was advised staying 1 more night for repeat labs. Patient had weakness in her PCP checked her packed RBC found her hemoglobin to be low and sent to the emergency room. Patient at discharge had hemoglobin at 7.4. Today in the emergency room it is 6.7. She has not had any blood in stool or melena. Some nausea but no vomiting. Has lower abdominal cramping.   PAST MEDICAL HISTORY:   Past Medical History  Diagnosis Date  . Asthma   . Cough   . Genital herpes   . HIV (human immunodeficiency virus infection) ([place])   . Bronchitis   . GERD (gastroesophageal reflux disease)   . Eye irritation   . Paresthesia of hand, bilateral   . Blepharitis   . Allergic rhinitis   . Hypertension   . Cigarette smoker   . Headache   . Shortness of breath dyspnea     at times due to asthma  . Depression   . Arthritis     DDD  . Claustrophobia     PAST SURGICAL HISTORY:   Past Surgical History  Procedure Laterality Date  . Knee surgery Bilateral 2003,2007    total Joint  . Hand surgery Bilateral     carpal tunnel  . Tubal ligation    . Laparoscopy for ectopic pregnancy    . Bunionectomy Bilateral     feet  . Finger fracture Right     5th finger  . Back  surgery may 2016      lumbar   . Video bronchoscopy N/A 06/30/2015    Procedure: VIDEO BRONCHOSCOPY WITHOUT FLUORO;  Surgeon: Vilinda Boehringer, MD;  Location: [facility] ORS;  Service: Cardiopulmonary;  Laterality: N/A;  . Diagnostic laparoscopy    . Back surgery    . Joint replacement Bilateral     knees  . Peripheral vascular catheterization N/A 01/12/2016    Procedure: Visceral Angiography;  Surgeon: Algernon Huxley, MD;  Location: [place] [facility];  Service: Cardiovascular;  Laterality: N/A;  . Peripheral vascular catheterization N/A 01/12/2016    Procedure: Visceral Artery Intervention;  Surgeon: Algernon Huxley, MD;  Location: [place] [facility];  Service: Cardiovascular;  Laterality: N/A;  . Esophagogastroduodenoscopy Left 01/11/2016    Procedure: ESOPHAGOGASTRODUODENOSCOPY (EGD);  Surgeon: Hulen Luster, MD;  Location: [facility] ENDOSCOPY;  Service: Endoscopy;  Laterality: Left;    SOCIAL HISTORY:   Social History  Substance Use Topics  . Smoking status: Light Tobacco Smoker -- 0.20 packs/day for 38 years    Types: Cigarettes  . Smokeless tobacco: Never Used  . Alcohol Use: 3.0 oz/week    5 Glasses of wine, 0 Standard drinks or equivalent per week     Comment: wine - couple days  of week    FAMILY HISTORY:   Family History  Problem Relation Age of Onset  . Breast cancer Sister     DRUG ALLERGIES:   Allergies  Allergen Reactions  . Morphine And Related Itching  . Gabapentin Rash  . Zanaflex  [Tizanidine] Rash    REVIEW OF SYSTEMS:   Review of Systems  Constitutional: Positive for malaise/fatigue. Negative for fever, chills and weight loss.  HENT: Negative for hearing loss and nosebleeds.   Eyes: Negative for blurred vision, double vision and pain.  Respiratory: Negative for cough, hemoptysis, sputum production, shortness of breath and wheezing.   Cardiovascular: Negative for chest pain, palpitations, orthopnea and leg swelling.  Gastrointestinal: Positive for abdominal pain.  Negative for nausea, vomiting, diarrhea and constipation.  Genitourinary: Negative for dysuria and hematuria.  Musculoskeletal: Negative for myalgias, back pain and falls.  Skin: Negative for rash.  Neurological: Positive for dizziness and weakness. Negative for tremors, sensory change, speech change, focal weakness, seizures and headaches.  Endo/Heme/Allergies: Does not bruise/bleed easily.  Psychiatric/Behavioral: Negative for depression and memory loss. The patient is not nervous/anxious.     MEDICATIONS AT HOME:   Prior to Admission medications   Medication Sig Start Date End Date Taking? Authorizing Provider  albuterol (PROVENTIL HFA;VENTOLIN HFA) 108 (90 BASE) MCG/ACT inhaler Inhale 2 puffs into the lungs every 6 (six) hours as needed for wheezing or shortness of breath.    Yes Historical Provider, MD  amLODipine (NORVASC) 10 MG tablet Take 10 mg by mouth daily.    Yes Historical Provider, MD  efavirenz-emtricitabine-tenofovir (ATRIPLA) 600-200-300 MG per tablet Take 1 tablet by mouth at bedtime.   Yes Historical Provider, MD  Fluticasone-Salmeterol (ADVAIR) 250-50 MCG/DOSE AEPB Inhale 1 puff into the lungs 2 (two) times daily.   Yes Historical Provider, MD  hydrochlorothiazide (HYDRODIURIL) 25 MG tablet Take 25 mg by mouth daily.   Yes Historical Provider, MD  ipratropium-albuterol (DUONEB) 0.5-2.5 (3) MG/3ML SOLN Inhale 3 mLs into the lungs every 6 (six) hours as needed (for wheezing/shortness of breath).    Yes Historical Provider, MD  losartan (COZAAR) 50 MG tablet Take 50 mg by mouth daily.   Yes Historical Provider, MD  mupirocin ointment (BACTROBAN) 2 % Apply 1 application topically 2 (two) times daily as needed (for rash).    Yes Historical Provider, MD  omeprazole (PRILOSEC) 40 MG capsule Take 40 mg by mouth 2 (two) times daily as needed (for acid reflux).   Yes Historical Provider, MD  oxyCODONE (ROXICODONE) 15 MG immediate release tablet Take 15 mg by mouth every 6 (six) hours  as needed for pain.   Yes Historical Provider, MD  promethazine (PHENERGAN) 25 MG tablet Take 25 mg by mouth every 6 (six) hours as needed for nausea or vomiting.    Yes Historical Provider, MD  valACYclovir (VALTREX) 500 MG tablet Take 500 mg by mouth 2 (two) times daily.   Yes Historical Provider, MD     VITAL SIGNS:  Blood pressure 99/62, pulse 94, temperature 98.4 F (36.9 C), temperature source Oral, resp. rate 18, height 5\' 6"  (1.676 m), weight 121.11 kg (267 lb), SpO2 99 %.  PHYSICAL EXAMINATION:  Physical Exam  GENERAL:  55 y.o.-year-old patient lying in the bed with no acute distress.  EYES: Pupils equal, round, reactive to light and accommodation. No scleral icterus. Extraocular muscles intact.  HEENT: Head atraumatic, normocephalic. Oropharynx and nasopharynx clear. No oropharyngeal erythema, moist oral mucosa. Pallor positive NECK:  Supple, no jugular venous distention.  No thyroid enlargement, no tenderness.  LUNGS: Normal breath sounds bilaterally, no wheezing, rales, rhonchi. No use of accessory muscles of respiration.  CARDIOVASCULAR: S1, S2 normal. No murmurs, rubs, or gallops.  ABDOMEN: Soft, nondistended. Bowel sounds present. No organomegaly or mass. Mild tenderness in the lower abdomen. No rigidity or guarding. EXTREMITIES: No pedal edema, cyanosis, or clubbing. + 2 pedal & radial pulses b/l.   NEUROLOGIC: Cranial nerves II through XII are intact. No focal Motor or sensory deficits appreciated b/l PSYCHIATRIC: The patient is alert and oriented x 3. Good affect.  SKIN: No obvious rash, lesion, or ulcer.   LABORATORY PANEL:   CBC  Recent Labs Lab 01/16/16 1410  WBC 15.1*  HGB 6.7*  HCT 20.5*  PLT 406   ------------------------------------------------------------------------------------------------------------------  Chemistries   Recent Labs Lab 01/16/16 1410  NA 132*  K 2.4*  CL 101  CO2 24  GLUCOSE 116*  BUN 6  CREATININE 0.82  CALCIUM 7.3*    ------------------------------------------------------------------------------------------------------------------  Cardiac Enzymes  Recent Labs Lab 01/09/16 2325  TROPONINI <0.03   ------------------------------------------------------------------------------------------------------------------  RADIOLOGY:  No results found.   IMPRESSION AND PLAN:   * Anemia due to recent blood loss No further blood in stool or melena. This likely from recent and not new GI bleed. Will admit Under observation. Transfuse 1 unit packed RBC. Repeat labs in the morning. There is any bleeding or further drop in hemoglobin patient will need GI evaluation. We'll place a full liquids for tonight.  * Hypokalemia Replace oral and IV Repeat labs in the morning Patient hydrochlorothiazide can be held or oral supplements added at discharge.  * HIV Continue home medications  * DVT prophylaxis with SCDs  All the records are reviewed and case discussed with ED provider. Management plans discussed with the patient, family and they are in agreement.  CODE STATUS: Full code  TOTAL TIME TAKING CARE OF THIS PATIENT: 40 minutes.   Hillary Bow R M.D on 01/16/2016 at 4:46 PM  Between 7am to 6pm - Pager - [phone]  After 6pm go to [email or web address] - password [email or web address] [place] [facility]  Office  [phone]  CC: Primary care physician; [facility]  Note: This dictation was prepared with Dragon dictation along with smaller phrase technology. Any transcriptional errors that result from this process are unintentional.

## 2016-01-17 DIAGNOSIS — D649 Anemia, unspecified: Secondary | ICD-10-CM | POA: Insufficient documentation

## 2016-01-17 DIAGNOSIS — K922 Gastrointestinal hemorrhage, unspecified: Secondary | ICD-10-CM | POA: Diagnosis not present

## 2016-01-17 LAB — BASIC METABOLIC PANEL
Anion gap: 5 (ref 5–15)
BUN: 5 mg/dL — ABNORMAL LOW (ref 6–20)
CO2: 25 mmol/L (ref 22–32)
Calcium: 7.4 mg/dL — ABNORMAL LOW (ref 8.9–10.3)
Chloride: 107 mmol/L (ref 101–111)
Creatinine, Ser: 0.83 mg/dL (ref 0.44–1.00)
GFR calc Af Amer: >60 mL/min (ref >60)
GFR calc non Af Amer: >60 mL/min (ref >60)
Glucose, Bld: 101 mg/dL — ABNORMAL HIGH (ref 65–99)
Potassium: 3.1 mmol/L — ABNORMAL LOW (ref 3.5–5.1)
Sodium: 137 mmol/L (ref 135–145)

## 2016-01-17 LAB — CBC
HCT: 22.2 % — ABNORMAL LOW (ref 35.0–47.0)
Hemoglobin: 7.3 g/dL — ABNORMAL LOW (ref 12.0–16.0)
MCH: 28.5 pg (ref 26.0–34.0)
MCHC: 32.9 g/dL (ref 32.0–36.0)
MCV: 86.8 fL (ref 80.0–100.0)
Platelets: 398 10*3/uL (ref 150–440)
RBC: 2.56 MIL/uL — ABNORMAL LOW (ref 3.80–5.20)
RDW: 16.8 % — ABNORMAL HIGH (ref 11.5–14.5)
WBC: 12.2 10*3/uL — ABNORMAL HIGH (ref 3.6–11.0)

## 2016-01-17 LAB — PREPARE RBC (CROSSMATCH)

## 2016-01-17 MED ORDER — SODIUM CHLORIDE 0.9 % IV SOLN
Freq: Once | INTRAVENOUS | Status: AC
  Administered 2016-01-17: 10:00:00 via INTRAVENOUS

## 2016-01-17 MED ORDER — DICYCLOMINE HCL 10 MG PO CAPS
10.0000 mg | ORAL_CAPSULE | Freq: Three times a day (TID) | ORAL | Status: DC
  Filled 2016-01-17: qty 1

## 2016-01-17 NOTE — Care Management Note (Signed)
Case Management Note  Patient Details  Name: [name] MRN: [id number] Date of Birth: Dec 05, 1960  Subjective/Objective:     55yo Ms Melanie Bowen was last discharged on 01/14/16 after inpatient treatment for melena and a GI bleed. Readmitted 01/16/16 with Hgb=6.7. Seen by Dr Allen Norris, GI. Currently monitoring Hgb and for possible colonoscopy on Monday. [name] lives alone. PCP= [facility]. Pharmacy=[facility] in [place]. Has a home health aid x 7 days per week. Either home health aid or her daughter provide transportation. Home health aid is a Medicaid provided service from [facility] in [place]. Case management will follow for discharge planning.               Action/Plan:   Expected Discharge Date:                  Expected Discharge Plan:     In-House Referral:     Discharge planning Services     Post Acute Care Choice:    Choice offered to:     DME Arranged:    DME Agency:     HH Arranged:    [place] Agency:     Status of Service:     Medicare Important Message Given:    Date Medicare IM Given:    Medicare IM give by:    Date Additional Medicare IM Given:    Additional Medicare Important Message give by:     If discussed at [place] of Stay Meetings, dates discussed:    Additional Comments:  Shalese Strahan A, RN 01/17/2016, 4:09 PM

## 2016-01-17 NOTE — Progress Notes (Signed)
Patient ID: MIKYRA NIVENS, female   DOB: 1960-12-18, 55 y.o.   MRN: [id number] [place] [facility] [place] NAME: Jazmere Stailey    MR#:  [id number]  DATE OF BIRTH:  [birth year]  SUBJECTIVE:  Sent over by her PCP as on labs check her Hb found to be at 6.7 (please note she was requested to stay over before her last D/C to get transfusion and then go but she refused and was adamant to leave), had had 6 units BT. Received 1 PRBC y'day. Hb 7.3, having some abd cramps REVIEW OF SYSTEMS:   Review of Systems  Constitutional: Negative for fever, chills and weight loss.  HENT: Negative for ear discharge, ear pain and nosebleeds.   Eyes: Negative for blurred vision, pain and discharge.  Respiratory: Negative for sputum production, shortness of breath, wheezing and stridor.   Cardiovascular: Negative for chest pain, palpitations, orthopnea and PND.  Gastrointestinal: Positive for abdominal pain. Negative for nausea, vomiting, diarrhea, blood in stool and melena.  Genitourinary: Negative for urgency and frequency.  Musculoskeletal: Negative for back pain and joint pain.  Neurological: Positive for weakness. Negative for sensory change, speech change and focal weakness.  Psychiatric/Behavioral: Negative for depression and hallucinations. The patient is not nervous/anxious.   All other systems reviewed and are negative.  Tolerating Diet; FLD Tolerating PT: not needed  DRUG ALLERGIES:   Allergies  Allergen Reactions  . Morphine And Related Itching  . Gabapentin Rash  . Zanaflex  [Tizanidine] Rash    VITALS:  Blood pressure 106/62, pulse 99, temperature 99.2 F (37.3 C), temperature source Oral, resp. rate 18, height 5\' 6"  (1.676 m), weight 121.201 kg (267 lb 3.2 oz), SpO2 99 %.  PHYSICAL EXAMINATION:   Physical Exam  GENERAL:  55 y.o.-year-old patient lying in the bed with no acute distress. Obese,pallor+ EYES: Pupils equal, round, reactive to  light and accommodation. No scleral icterus. Extraocular muscles intact. Pallor+ HEENT: Head atraumatic, normocephalic. Oropharynx and nasopharynx clear.  NECK:  Supple, no jugular venous distention. No thyroid enlargement, no tenderness.  LUNGS: Normal breath sounds bilaterally, no wheezing, rales, rhonchi. No use of accessory muscles of respiration.  CARDIOVASCULAR: S1, S2 normal. No murmurs, rubs, or gallops.  ABDOMEN: Soft, diffuse tenderness, nondistended.few Bowel sounds present. No organomegaly or mass.  EXTREMITIES: No cyanosis, clubbing or edema b/l.    NEUROLOGIC: Cranial nerves II through XII are intact. No focal Motor or sensory deficits b/l.   PSYCHIATRIC:  patient is alert and oriented x 3.  SKIN: No obvious rash, lesion, or ulcer.   LABORATORY PANEL:  CBC  Recent Labs Lab 01/17/16 0427  WBC 12.2*  HGB 7.3*  HCT 22.2*  PLT 398    Chemistries   Recent Labs Lab 01/17/16 0427  NA 137  K 3.1*  CL 107  CO2 25  GLUCOSE 101*  BUN 5*  CREATININE 0.83  CALCIUM 7.4*   Cardiac Enzymes No results for input(s): TROPONINI in the last 168 hours. RADIOLOGY:  No results found. ASSESSMENT AND PLAN:   Lubna Vialpando is a 55 y.o. female who presents with Abdominal pain and melena. Patient was recently D/C (she was adamant in leaving in spite of requests to get transfusion) after w/up of GI bleed. Readmitted for low Hb and abd cramps  1.GI bleed - upper or lower, though her history of recent surgery, emotional stress, and some ibuprofen use might seem to indicate upper GI process.  - GI consult (d/w  Dr Allen Norris - may need c-scope) - EGD normal endoscopy on last admission - Hb 7.3 s/p 1 PRBC transfusion. Will order 1 more PRBC for today - had positive bleeding scan on last admission for which she underwent microbead embolization on tertiary IMA branch into splenic flexure  by Dr [name] on 01/12/16 - Bentyl PRN for abd spasms  2. HTN (hypertension) - currently stable, though  normotensive so hold home antihypertensives for now.  3. HIV (human immunodeficiency virus infection) ([place]) - continue home dose HAART therapy  4.GERD (gastroesophageal reflux disease) - PPI drip as above  5. Depression - continue home meds   Case discussed with Care Management/Social Worker and GI Dr Allen Norris Management plans discussed with the patient, family and they are in agreement.  CODE STATUS: full  DVT Prophylaxis: scd  TOTAL TIME TAKING CARE OF THIS PATIENT: 30 minutes.  >50% time spent on counselling and coordination of care  POSSIBLE D/C IN 1-2 DAYS, DEPENDING ON CLINICAL CONDITION and GI w/up  Note: This dictation was prepared with Dragon dictation along with smaller phrase technology. Any transcriptional errors that result from this process are unintentional.  [facility], Nikolas Casher M.D on 01/17/2016 at 10:02 AM  Between 7am to 6pm - Pager - [phone]  After 6pm go to [email or web address] - password [email or web address] [place] [facility]  Office  [phone]  CC: Primary care physician; [facility]

## 2016-01-17 NOTE — Consult Note (Signed)
[facility]  [address] [place], [place] [address] Phone: [phone] Fax : [phone]  Consultation  Referring Provider:     No ref. provider found Primary Care Physician:  [facility] Primary Gastroenterologist:  Dr. Candace Cruise         Reason for Consultation:     Anemia  Date of Admission:  01/16/2016 Date of Consultation:  01/17/2016         HPI:   Melanie Bowen is a 55 y.o. female who comes in today after being discharged from the hospital a few days ago for lower GI bleeding. The patient an upper endoscopy that was negative and the patient then underwent a bleeding scan which showed her to have bleeding at the splenic flexure. The patient was then taken to the fluoroscopy suite and embolization was done of this area. The patient was sent home and states that she was having brown stools without any problems except some twinges of abdominal pain that would only lasts a few seconds and then go away. The patient went to see her primary care physician and was told that her hemoglobin was significantly lower than when she was discharged. The patient's hemoglobin in the emergency room was 6.7 with discharge at 7.4. The patient denies any other sources of possible bleeding and she does report some nausea without any vomiting.  Past Medical History  Diagnosis Date  . Asthma   . Cough   . Genital herpes   . HIV (human immunodeficiency virus infection) ([place])   . Bronchitis   . GERD (gastroesophageal reflux disease)   . Eye irritation   . Paresthesia of hand, bilateral   . Blepharitis   . Allergic rhinitis   . Hypertension   . Cigarette smoker   . Headache   . Shortness of breath dyspnea     at times due to asthma  . Depression   . Arthritis     DDD  . Claustrophobia     Past Surgical History  Procedure Laterality Date  . Knee surgery Bilateral 2003,2007    total Joint  . Hand surgery Bilateral     carpal tunnel  . Tubal ligation      . Laparoscopy for ectopic pregnancy    . Bunionectomy Bilateral     feet  . Finger fracture Right     5th finger  . Back surgery may 2016      lumbar   . Video bronchoscopy N/A 06/30/2015    Procedure: VIDEO BRONCHOSCOPY WITHOUT FLUORO;  Surgeon: Vilinda Boehringer, MD;  Location: [facility] ORS;  Service: Cardiopulmonary;  Laterality: N/A;  . Diagnostic laparoscopy    . Back surgery    . Joint replacement Bilateral     knees  . Peripheral vascular catheterization N/A 01/12/2016    Procedure: Visceral Angiography;  Surgeon: Algernon Huxley, MD;  Location: [place] [facility];  Service: Cardiovascular;  Laterality: N/A;  . Peripheral vascular catheterization N/A 01/12/2016    Procedure: Visceral Artery Intervention;  Surgeon: Algernon Huxley, MD;  Location: [place] [facility];  Service: Cardiovascular;  Laterality: N/A;  . Esophagogastroduodenoscopy Left 01/11/2016    Procedure: ESOPHAGOGASTRODUODENOSCOPY (EGD);  Surgeon: Hulen Luster, MD;  Location: [facility] ENDOSCOPY;  Service: Endoscopy;  Laterality: Left;    Prior to Admission medications   Medication Sig Start Date End Date Taking? Authorizing Provider  albuterol (PROVENTIL HFA;VENTOLIN HFA) 108 (90 BASE) MCG/ACT inhaler Inhale 2 puffs into the lungs every 6 (six)  hours as needed for wheezing or shortness of breath.    Yes Historical Provider, MD  amLODipine (NORVASC) 10 MG tablet Take 10 mg by mouth daily.    Yes Historical Provider, MD  efavirenz-emtricitabine-tenofovir (ATRIPLA) 600-200-300 MG per tablet Take 1 tablet by mouth at bedtime.   Yes Historical Provider, MD  Fluticasone-Salmeterol (ADVAIR) 250-50 MCG/DOSE AEPB Inhale 1 puff into the lungs 2 (two) times daily.   Yes Historical Provider, MD  hydrochlorothiazide (HYDRODIURIL) 25 MG tablet Take 25 mg by mouth daily.   Yes Historical Provider, MD  ipratropium-albuterol (DUONEB) 0.5-2.5 (3) MG/3ML SOLN Inhale 3 mLs into the lungs every 6 (six) hours as needed (for wheezing/shortness of breath).     Yes Historical Provider, MD  losartan (COZAAR) 50 MG tablet Take 50 mg by mouth daily.   Yes Historical Provider, MD  mupirocin ointment (BACTROBAN) 2 % Apply 1 application topically 2 (two) times daily as needed (for rash).    Yes Historical Provider, MD  omeprazole (PRILOSEC) 40 MG capsule Take 40 mg by mouth 2 (two) times daily as needed (for acid reflux).   Yes Historical Provider, MD  oxyCODONE (ROXICODONE) 15 MG immediate release tablet Take 15 mg by mouth every 6 (six) hours as needed for pain.   Yes Historical Provider, MD  promethazine (PHENERGAN) 25 MG tablet Take 25 mg by mouth every 6 (six) hours as needed for nausea or vomiting.    Yes Historical Provider, MD  valACYclovir (VALTREX) 500 MG tablet Take 500 mg by mouth 2 (two) times daily.   Yes Historical Provider, MD    Family History  Problem Relation Age of Onset  . Breast cancer Sister      Social History  Substance Use Topics  . Smoking status: Light Tobacco Smoker -- 0.20 packs/day for 38 years    Types: Cigarettes  . Smokeless tobacco: Never Used  . Alcohol Use: 3.0 oz/week    5 Glasses of wine, 0 Standard drinks or equivalent per week     Comment: wine - couple days of week    Allergies as of 01/16/2016 - Review Complete 01/16/2016  Allergen Reaction Noted  . Morphine and related Itching 07/28/2015  . Gabapentin Rash 11/12/2014  . Zanaflex  [tizanidine] Rash 11/12/2014    Review of Systems:    All systems reviewed and negative except where noted in HPI.   Physical Exam:  Vital signs in last 24 hours: Temp:  [98.1 F (36.7 C)-99.6 F (37.6 C)] 98.3 F (36.8 C) (05/20 1043) Pulse Rate:  [91-99] 92 (05/20 1043) Resp:  [16-18] 18 (05/20 1043) BP: (83-108)/(48-62) 108/53 mmHg (05/20 1043) SpO2:  [97 %-100 %] 100 % (05/20 1043) Weight:  [267 lb (121.11 kg)-267 lb 3.2 oz (121.201 kg)] 267 lb 3.2 oz (121.201 kg) (05/20 0500) Last BM Date: 01/17/16 (Medium, formed, brown stool per pt.) General:   Pleasant,  cooperative in NAD Head:  Normocephalic and atraumatic. Eyes:   No icterus.   Conjunctiva pink. PERRLA. Ears:  Normal auditory acuity. Neck:  Supple; no masses or thyroidomegaly Lungs: Respirations even and unlabored. Lungs clear to auscultation bilaterally.   No wheezes, crackles, or rhonchi.  Heart:  Regular rate and rhythm;  Without murmur, clicks, rubs or gallops Abdomen:  Soft, nondistended, nontender. Normal bowel sounds. No appreciable masses or hepatomegaly.  No rebound or guarding.  Rectal:  Not performed. Msk:  Symmetrical without gross deformities.    Extremities:  Without edema, cyanosis or clubbing. Neurologic:  Alert and oriented x3;  grossly normal neurologically. Skin:  Intact without significant lesions or rashes. Cervical Nodes:  No significant cervical adenopathy. Psych:  Alert and cooperative. Normal affect.  LAB RESULTS:  Recent Labs  01/16/16 1410 01/17/16 0427  WBC 15.1* 12.2*  HGB 6.7* 7.3*  HCT 20.5* 22.2*  PLT 406 398   BMET  Recent Labs  01/16/16 1410 01/17/16 0427  NA 132* 137  K 2.4* 3.1*  CL 101 107  CO2 24 25  GLUCOSE 116* 101*  BUN 6 5*  CREATININE 0.82 0.83  CALCIUM 7.3* 7.4*   LFT No results for input(s): PROT, ALBUMIN, AST, ALT, ALKPHOS, BILITOT, BILIDIR, IBILI in the last 72 hours. PT/INR No results for input(s): LABPROT, INR in the last 72 hours.  STUDIES: No results found.    Impression / Plan:   Melanie Bowen is a 55 y.o. y/o female with A recent GI bleed in the splenic flexure treated by vascular surgery with embolization. The patient had been doing well with some mild abdominal discomfort but was found to have significant anemia. The patient was then readmitted to the hospital and a GI consult was called. The patient has early in today. The patient may need a colonoscopy if her hemoglobin continues to drop. The patient has been given my card and told that if her hemoglobin is lower tomorrow then she will be prepped for a  colonoscopy for Monday. The patient has been explained the plan and agrees with it.   Thank you for involving me in the care of this patient.        Lucilla Lame, MD  01/17/2016, 1:25 PM   Note: This dictation was prepared with Dragon dictation along with smaller phrase technology. Any transcriptional errors that result from this process are unintentional.

## 2016-01-17 NOTE — Plan of Care (Signed)
Problem: Education: Goal: Knowledge of [facility] Education information/materials will improve Outcome: Progressing Pt recently dcd from this facility after low Hgb.  Returned with Hgb of 6.7.  1 unit of blood administered. Potassium at 2.4.  Pt received 3 bags of Potassium as well as PO potassium.  Problem: Pain Managment: Goal: General experience of comfort will improve Outcome: Progressing Pt complains of chronic back pain since she had back surgery in May 2016.  Oxycodone given.  One time dose of dilaudid administered.

## 2016-01-17 NOTE — Plan of Care (Signed)
Problem: Pain Managment: Goal: General experience of comfort will improve Outcome: Progressing Pt c/o intermittent abdominal cramping, relieved with bentyl. Chronic back pain managed with pain meds.  Problem: Physical Regulation: Goal: Ability to maintain clinical measurements within normal limits will improve Outcome: Progressing In am Hgb 7.3.  1xunit of RBC's transfused, no transfusion reaction occurred. CBC pending tomorrow am. Pt denies n/v. No signs of bleeding. Pt reported having 1x formed brown stool today.

## 2016-01-18 DIAGNOSIS — K922 Gastrointestinal hemorrhage, unspecified: Secondary | ICD-10-CM | POA: Diagnosis not present

## 2016-01-18 LAB — BASIC METABOLIC PANEL
Anion gap: 3 — ABNORMAL LOW (ref 5–15)
BUN: 7 mg/dL (ref 6–20)
CO2: 25 mmol/L (ref 22–32)
Calcium: 7.7 mg/dL — ABNORMAL LOW (ref 8.9–10.3)
Chloride: 109 mmol/L (ref 101–111)
Creatinine, Ser: 0.82 mg/dL (ref 0.44–1.00)
GFR calc Af Amer: >60 mL/min (ref >60)
GFR calc non Af Amer: >60 mL/min (ref >60)
Glucose, Bld: 106 mg/dL — ABNORMAL HIGH (ref 65–99)
Potassium: 3 mmol/L — ABNORMAL LOW (ref 3.5–5.1)
Sodium: 137 mmol/L (ref 135–145)

## 2016-01-18 LAB — CBC
HCT: 24.4 % — ABNORMAL LOW (ref 35.0–47.0)
Hemoglobin: 8.2 g/dL — ABNORMAL LOW (ref 12.0–16.0)
MCH: 29 pg (ref 26.0–34.0)
MCHC: 33.6 g/dL (ref 32.0–36.0)
MCV: 86.3 fL (ref 80.0–100.0)
Platelets: 389 10*3/uL (ref 150–440)
RBC: 2.83 MIL/uL — ABNORMAL LOW (ref 3.80–5.20)
RDW: 16.8 % — ABNORMAL HIGH (ref 11.5–14.5)
WBC: 10.6 10*3/uL (ref 3.6–11.0)

## 2016-01-18 LAB — TYPE AND SCREEN
ABO/RH(D): O POS
Antibody Screen: NEGATIVE
Unit division: 0
Unit division: 0

## 2016-01-18 MED ORDER — DICYCLOMINE HCL 10 MG PO CAPS: 10.0000 mg | ORAL_CAPSULE | Freq: Three times a day (TID) | ORAL | Status: DC | PRN

## 2016-01-18 MED ORDER — POTASSIUM CHLORIDE CRYS ER 20 MEQ PO TBCR
40.0000 meq | EXTENDED_RELEASE_TABLET | Freq: Once | ORAL | Status: AC
  Administered 2016-01-18: 11:00:00 40 meq via ORAL
  Filled 2016-01-18: qty 2

## 2016-01-18 NOTE — Discharge Instructions (Signed)
Anemia, Nonspecific Anemia is a condition in which the concentration of red blood cells or hemoglobin in the blood is below normal. Hemoglobin is a substance in red blood cells that carries oxygen to the tissues of the body. Anemia results in not enough oxygen reaching these tissues.  CAUSES  Common causes of anemia include:   Excessive bleeding. Bleeding may be internal or external. This includes excessive bleeding from periods (in women) or from the intestine.   Poor nutrition.   Chronic kidney, thyroid, and liver disease.  Bone marrow disorders that decrease red blood cell production.  Cancer and treatments for cancer.  HIV, AIDS, and their treatments.  Spleen problems that increase red blood cell destruction.  Blood disorders.  Excess destruction of red blood cells due to infection, medicines, and autoimmune disorders. SIGNS AND SYMPTOMS   Minor weakness.   Dizziness.   Headache.  Palpitations.   Shortness of breath, especially with exercise.   Paleness.  Cold sensitivity.  Indigestion.  Nausea.  Difficulty sleeping.  Difficulty concentrating. Symptoms may occur suddenly or they may develop slowly.  DIAGNOSIS  Additional blood tests are often needed. These help your health care provider determine the best treatment. Your health care provider will check your stool for blood and look for other causes of blood loss.  TREATMENT  Treatment varies depending on the cause of the anemia. Treatment can include:   Supplements of iron, vitamin B12, or folic acid.   Hormone medicines.   A blood transfusion. This may be needed if blood loss is severe.   Hospitalization. This may be needed if there is significant continual blood loss.   Dietary changes.  Spleen removal. HOME CARE INSTRUCTIONS Keep all follow-up appointments. It often takes many weeks to correct anemia, and having your health care provider check on your condition and your response to  treatment is very important. SEEK IMMEDIATE MEDICAL CARE IF:   You develop extreme weakness, shortness of breath, or chest pain.   You become dizzy or have trouble concentrating.  You develop heavy vaginal bleeding.   You develop a rash.   You have bloody or black, tarry stools.   You faint.   You vomit up blood.   You vomit repeatedly.   You have abdominal pain.  You have a fever or persistent symptoms for more than 2-3 days.   You have a fever and your symptoms suddenly get worse.   You are dehydrated.  MAKE SURE YOU:  Understand these instructions.  Will watch your condition.  Will get help right away if you are not doing well or get worse.   This information is not intended to replace advice given to you by your health care provider. Make sure you discuss any questions you have with your health care provider.   Document Released: 09/23/2004 Document Revised: 04/18/2013 Document Reviewed: 02/09/2013 Elsevier Interactive Patient Education 2016 Elsevier Inc.  

## 2016-01-18 NOTE — Discharge Summary (Signed)
Melanie Bowen at [place] NAME: Melanie Bowen    MR#:  [id number]  DATE OF BIRTH:  04/07/1961  DATE OF ADMISSION:  01/16/2016 ADMITTING PHYSICIAN: Melanie Bow, MD  DATE OF DISCHARGE: 01/18/2016  PRIMARY CARE PHYSICIAN: Melanie Bowen    ADMISSION DIAGNOSIS:  Hypokalemia [E87.6] Symptomatic anemia [D64.9]  DISCHARGE DIAGNOSIS:  Active Problems:   Anemia   Symptomatic anemia   Lower GI bleed  SECONDARY DIAGNOSIS:   Past Medical History  Diagnosis Date  . Asthma   . Cough   . Genital herpes   . HIV (human immunodeficiency virus infection) ([place])   . Bronchitis   . GERD (gastroesophageal reflux disease)   . Eye irritation   . Paresthesia of hand, bilateral   . Blepharitis   . Allergic rhinitis   . Hypertension   . Cigarette smoker   . Headache   . Shortness of breath dyspnea     at times due to asthma  . Depression   . Arthritis     DDD  . Claustrophobia    HOSPITAL COURSE:  Melanie Bowen is a 55 y.o. female who presents with Abdominal pain and melena. Patient was recently D/C (she was adamant in leaving in spite of requests to get transfusion) after w/up of GI bleed. Readmitted for low Hb and abd cramps  1.GI bleed -  Required 2 more units of packed red blood cell no further evidence of bleeding. GI did not recommend any luminal intervention at this time  2. HTN (hypertension) - currently stable, though normotensive so hold home antihypertensives for now.  3. HIV (human immunodeficiency virus infection) (Melanie Bowen) - continue home dose HAART therapy  4.GERD (gastroesophageal reflux disease) - PPI drip as above  5. Depression - continue home meds DISCHARGE CONDITIONS:  Stable CONSULTS OBTAINED:  Treatment Team:  Lucilla Lame, MD  DRUG ALLERGIES:   Allergies  Allergen Reactions  . Morphine And Related Itching  . Gabapentin Rash  . Zanaflex  [Tizanidine] Rash    DISCHARGE MEDICATIONS:    Discharge Medication List as of 01/18/2016 10:42 AM    START taking these medications   Details  dicyclomine (BENTYL) 10 MG capsule Take 1 capsule (10 mg total) by mouth 3 (three) times daily as needed (Abdominal cramping)., Starting 01/18/2016, Until Discontinued, Normal      CONTINUE these medications which have NOT CHANGED   Details  albuterol (PROVENTIL HFA;VENTOLIN HFA) 108 (90 BASE) MCG/ACT inhaler Inhale 2 puffs into the lungs every 6 (six) hours as needed for wheezing or shortness of breath. , Until Discontinued, Historical Med    amLODipine (NORVASC) 10 MG tablet Take 10 mg by mouth daily. , Until Discontinued, Historical Med    efavirenz-emtricitabine-tenofovir (ATRIPLA) 600-200-300 MG per tablet Take 1 tablet by mouth at bedtime., Until Discontinued, Historical Med    Fluticasone-Salmeterol (ADVAIR) 250-50 MCG/DOSE AEPB Inhale 1 puff into the lungs 2 (two) times daily., Until Discontinued, Historical Med    hydrochlorothiazide (HYDRODIURIL) 25 MG tablet Take 25 mg by mouth daily., Until Discontinued, Historical Med    ipratropium-albuterol (DUONEB) 0.5-2.5 (3) MG/3ML SOLN Inhale 3 mLs into the lungs every 6 (six) hours as needed (for wheezing/shortness of breath). , Until Discontinued, Historical Med    losartan (COZAAR) 50 MG tablet Take 50 mg by mouth daily., Until Discontinued, Historical Med    mupirocin ointment (BACTROBAN) 2 % Apply 1 application topically 2 (two) times daily as needed (for rash). , Until Discontinued,  Historical Med    omeprazole (PRILOSEC) 40 MG capsule Take 40 mg by mouth 2 (two) times daily as needed (for acid reflux)., Until Discontinued, Historical Med    oxyCODONE (ROXICODONE) 15 MG immediate release tablet Take 15 mg by mouth every 6 (six) hours as needed for pain., Until Discontinued, Historical Med    promethazine (PHENERGAN) 25 MG tablet Take 25 mg by mouth every 6 (six) hours as needed for nausea or vomiting. , Until Discontinued,  Historical Med    valACYclovir (VALTREX) 500 MG tablet Take 500 mg by mouth 2 (two) times daily., Until Discontinued, Historical Med         DISCHARGE INSTRUCTIONS:    DIET:  Regular diet  DISCHARGE CONDITION:  Good  ACTIVITY:  Activity as tolerated  OXYGEN:  Home Oxygen: No.   Oxygen Delivery: room air  DISCHARGE LOCATION:  home   If you experience worsening of your admission symptoms, develop shortness of breath, life threatening emergency, suicidal or homicidal thoughts you must seek medical attention immediately by calling 911 or calling your MD immediately  if symptoms less severe.  You Must read complete instructions/literature along with all the possible adverse reactions/side effects for all the Medicines you take and that have been prescribed to you. Take any new Medicines after you have completely understood and accpet all the possible adverse reactions/side effects.   Please note  You were cared for by a hospitalist during your hospital stay. If you have any questions about your discharge medications or the care you received while you were in the hospital after you are discharged, you can call the unit and asked to speak with the hospitalist on call if the hospitalist that took care of you is not available. Once you are discharged, your primary care physician will handle any further medical issues. Please note that NO REFILLS for any discharge medications will be authorized once you are discharged, as it is imperative that you return to your primary care physician (or establish a relationship with a primary care physician if you do not have one) for your aftercare needs so that they can reassess your need for medications and monitor your lab values.    On the day of Discharge:  VITAL SIGNS:  Blood pressure 126/69, pulse 82, temperature 98 F (36.7 C), temperature source Oral, resp. rate 18, height 5\' 6"  (1.676 m), weight 118.842 kg (262 lb), SpO2 100 %.  PHYSICAL  EXAMINATION:  GENERAL:  54 y.o.-year-old patient lying in the bed with no acute distress.  EYES: Pupils equal, round, reactive to light and accommodation. No scleral icterus. Extraocular muscles intact.  HEENT: Head atraumatic, normocephalic. Oropharynx and nasopharynx clear.  NECK:  Supple, no jugular venous distention. No thyroid enlargement, no tenderness.  LUNGS: Normal breath sounds bilaterally, no wheezing, rales,rhonchi or crepitation. No use of accessory muscles of respiration.  CARDIOVASCULAR: S1, S2 normal. No murmurs, rubs, or gallops.  ABDOMEN: Soft, non-tender, non-distended. Bowel sounds present. No organomegaly or mass.  EXTREMITIES: No pedal edema, cyanosis, or clubbing.  NEUROLOGIC: Cranial nerves II through XII are intact. Muscle strength 5/5 in all extremities. Sensation intact. Gait not checked.  PSYCHIATRIC: The patient is alert and oriented x 3.  SKIN: No obvious rash, lesion, or ulcer.  DATA REVIEW:   CBC  Recent Labs Lab 01/18/16 0453  WBC 10.6  HGB 8.2*  HCT 24.4*  PLT 389    Chemistries   Recent Labs Lab 01/18/16 0453  NA 137  K 3.0*  CL  109  CO2 25  GLUCOSE 106*  BUN 7  CREATININE 0.82  CALCIUM 7.7*     Management plans discussed with the patient, family and they are in agreement.  CODE STATUS: Full code   Follow-up Information    Follow up with [facility]. Schedule an appointment as soon as possible for a visit in 2 weeks.   Why:  [facility] Discharge F/UP   Contact information:   [address] [place] [address] [phone]       Follow up with Lucilla Lame, MD. Schedule an appointment as soon as possible for a visit in 1 week.   Specialty:  Gastroenterology   Why:  [facility] Discharge F/UP   Contact information:   [address] [place] [address] [phone]      She remains at very high risk for readmission TOTAL TIME TAKING CARE OF THIS PATIENT: 45 minutes.    [facility], Prerna Harold  M.D on 01/18/2016 at 5:05 PM  Between 7am to 6pm - Pager - [phone]  After 6pm go to [email or web address] - password [email or web address] [facility]  Office  [phone]  CC: Primary care physician; [facility]   Note: This dictation was prepared with Dragon dictation along with smaller phrase technology. Any transcriptional errors that result from this process are unintentional.

## 2016-01-18 NOTE — Progress Notes (Signed)
Melanie Bowen at [place] was admitted to the [facility] on 01/16/2016 and Discharged  01/18/2016 and her fiance should be excused from work   for 3 days starting 01/16/2016 , may return to work without any restrictions on 01/19/2016  [facility] M.D on 01/18/2016,at 10:35 AM  [place] at [facility]  [phone]

## 2016-01-18 NOTE — Progress Notes (Signed)
Pt is being discharged home. Discharge instructions given and explained to pt. Pt verbalized understanding. F/u appointment and meds reviewed with pt. Pt to pick up bentyl from pharmacy.

## 2016-01-18 NOTE — Progress Notes (Signed)
  [facility]  [address] [place], [place] [address] Phone: [phone] Fax : [phone]   Subjective: The patient came in with anemia. The patient's hemoglobin has remained stable. The patient continues to have brown stool without any signs of bloody stools. The patient is status post embolization of a splenic flexure bleed. She does not have any increase in her abdominal pain.   Objective: Vital signs in last 24 hours: Filed Vitals:   01/18/16 0150 01/18/16 0417 01/18/16 0616 01/18/16 1028  BP:   110/66 126/69  Pulse:   79 82  Temp: 98.4 F (36.9 C)  97.9 F (36.6 C) 98 F (36.7 C)  TempSrc: Oral  Oral Oral  Resp:    18  Height:      Weight:  262 lb (118.842 kg)    SpO2:   100% 100%   Weight change: -5 lb (-2.268 kg)  Intake/Output Summary (Last 24 hours) at 01/18/16 1042 Last data filed at 01/18/16 0900  Gross per 24 hour  Intake    823 ml  Output      0 ml  Net    823 ml     Exam: Heart:: Regular rate and rhythm Lungs: normal Abdomen: soft, nontender, normal bowel sounds   Lab Results: @LABTEST2 @ Micro Results: Recent Results (from the past 240 hour(s))  MRSA PCR Screening     Status: None   Collection Time: 01/10/16  2:35 PM  Result Value Ref Range Status   MRSA by PCR NEGATIVE NEGATIVE Final    Comment:        The GeneXpert MRSA Assay (FDA approved for NASAL specimens only), is one component of a comprehensive MRSA colonization surveillance program. It is not intended to diagnose MRSA infection nor to guide or monitor treatment for MRSA infections.    Studies/Results: No results found. Medications: I have reviewed the patient's current medications. Scheduled Meds: . amLODipine  10 mg Oral Daily  . efavirenz-emtricitabine-tenofovir  1 tablet Oral QHS  . losartan  50 mg Oral Daily  . mometasone-formoterol  2 puff Inhalation BID  . pantoprazole  40 mg Oral Daily  . sodium chloride flush  3 mL Intravenous Q12H  .  valACYclovir  500 mg Oral BID   Continuous Infusions:  PRN Meds:.sodium chloride, acetaminophen **OR** acetaminophen, albuterol, dicyclomine, HYDROcodone-acetaminophen, ipratropium-albuterol, mupirocin ointment, ondansetron **OR** ondansetron (ZOFRAN) IV, oxyCODONE, polyethylene glycol, promethazine, sodium chloride flush   Assessment: Active Problems:   Anemia   Symptomatic anemia   Lower GI bleed    Plan: This patient comes in with anemia and has responded appropriately to transfusions. The patient has twinges of abdominal pain and would like to be sent home on the antispasmodics. The patient is at high risk for perforation with her recent embolization of her colon. The patient has been given my card and has been told to follow-up as an outpatient with me.     Melanie Bowen 01/18/2016, 10:42 AM

## 2016-01-21 ENCOUNTER — Telehealth: Payer: Self-pay | Admitting: Gastroenterology

## 2016-01-21 NOTE — Telephone Encounter (Signed)
Saw Dr. Candace Cruise but she stated that Dr. Allen Norris gave her his card if she needed him. She wants to know if she can take tylenol. She hasn't seen any blood. She is waiting to see her pcp on Tuesday to get referral.

## 2016-01-22 NOTE — Telephone Encounter (Signed)
Left message letting pt know she is not currently a pt of Dr. Allen Norris and we do not know her medical hx. Pt is having a referral sent here by her PCP next week.

## 2016-02-11 ENCOUNTER — Ambulatory Visit (INDEPENDENT_AMBULATORY_CARE_PROVIDER_SITE_OTHER): Payer: Medicare Other | Admitting: Internal Medicine

## 2016-02-11 ENCOUNTER — Encounter: Payer: Self-pay | Admitting: Internal Medicine

## 2016-02-11 VITALS — BP 124/80 | HR 107 | Ht 66.0 in | Wt 259.0 lb

## 2016-02-11 DIAGNOSIS — Z716 Tobacco abuse counseling: Secondary | ICD-10-CM

## 2016-02-11 DIAGNOSIS — J4540 Moderate persistent asthma, uncomplicated: Secondary | ICD-10-CM

## 2016-02-11 DIAGNOSIS — R05 Cough: Secondary | ICD-10-CM | POA: Diagnosis not present

## 2016-02-11 DIAGNOSIS — D62 Acute posthemorrhagic anemia: Secondary | ICD-10-CM | POA: Diagnosis not present

## 2016-02-11 DIAGNOSIS — R059 Cough, unspecified: Secondary | ICD-10-CM

## 2016-02-11 DIAGNOSIS — D649 Anemia, unspecified: Secondary | ICD-10-CM

## 2016-02-11 NOTE — Assessment & Plan Note (Signed)
Current he Not controlled due to having symptomatic anemia from GI blood loss.   She has been educated on the proper use of albuterol.I believe that her continued smoking is triggering bronchospasms along with recurrent respiratory infections. Today I counseled the patient heavily on quit tobacco and current effects it can have on her current respiratory status.  Also today, educated patient heavily on need and use of a rescue nebulizer and rescue inhaler along with his current indications and administrations. She does have a history of HIV, currently following with infectious disease, follows up with Dr. [name].  Her Bronch with BAL showed Serratia Liquieformas, rare bacteria, she has followed with Dr. [name] for this. Plan: - albuterol inhaler - 2puff every 3-4 hours as needed for shortness of breath\wheezing\recurrent cough - continue Advair Diskus - 1 puff twice a day (rinse and gargle after each use). - Keep follow up with ID for rare organism on BAL given HIV (+) history.

## 2016-02-11 NOTE — Patient Instructions (Signed)
Follow up with Dr. Stevenson Clinch in:3 months - cont with current dose of Advair - use albuterol inhaler and duonebs as rescue meds - Quit all forms of tobacco - Avoid vaping, ecigs, etc.

## 2016-02-11 NOTE — Progress Notes (Signed)
[facility] Consultation      MRN# [id number] Melanie Bowen [birth year]   CC: Chief Complaint  Patient presents with  . Follow-up      Brief History: 10/2013 -  She is a pleasant 55 year old female past medical history of asthma, HIV, in consultation today for recurrent bronchitis/cough/orders of breath. Patient states that she's had recurrent upper respiratory tract infections over the past 2 months, this accompanied with cough (brown to clear productive sputum). Recently she went to the ED for visit due to cough and shortness of breath, she also had a chest x-ray at that time which was very 21st 2016, patient is getting treatment at that time due to being in the waiting room to long and left before treatment was given. Over the past months she's been using albuterol 2 times a day and nebulizer 2-3 times a day. Patient states that she is currently on Advair but she only uses it 3-4 days per week, she denies any intubation for respiratory issues in the past. States that she has about 4 episodes a year of "bronchitis" requiring steroids. She currently smokes half pack to a quarter pack per day for the past 40 years, she quits intermittently for months at a time. In the past she has used cocaine but currently denies any current use. No pets at home, previously worked in a [employer].   Review of records (personally reviewed by Dr. Edwin Dada time spent reviewing 30 minutes) Office visit 10/21/2014-recently completed antibiotics and prednisone 5 day course still feeling chest pressure, shortness of breath, wheezing, using inhalers daily, using nebulizers twice a day, no fever, still smoking. Plan prednisone taper, nicotine patch, referral to pulmonary. Office visit 10/15/2014-presented with complaints of cough, rhinorrhea, sinus pressure, right ear pain, wheezing, body aches and malaise, purulent sputum, cough, shortness of breath, still smoking. Plan Levaquin, steroid  burst, DuoNeb's U4 to 6 hours while awake for the next few days, continue Advair. Office visit 09/11/2014-complaints of cough, recently completed antibiotics and prednisone, still with some shortness of breath and wheezing, however this is improved, sputum is decrease. Plan quit smoking, humidify prescription, DuoNeb solution, start Advair.  Plan: - pulmonary function testing and 6 minute walk test prior to follow up - albuterol inhaler - 2puff every 3-4 hours as needed for shortness of breath\wheezing\recurrent cough - continue Advair Diskus - 1 puff twice a day (rinse and gargle after each use). - If patient continues to have a upper respiratory tract infections, bronchitis, will need to consider bronchoscopy with BAL, this can be discussed at her follow-up visit   [place] 06/25/15: Patient presents today for a follow up visit of cough and asthma. Accompanied by nurse aid.  Still with cough (with mild sputum production - clear to thick white).  Smoking about 2 cigs per day. Using albuterol rescue inhaler 2 times per day, in addition to using the albuterol neb 2 times per day. States that other members in her house smoke also. Would like to quit smoking and asked for assistance.   Plan Plan: - CT chest with contrast and Bronch with BAL - albuterol inhaler - 2puff every 3-4 hours as needed for shortness of breath\wheezing\recurrent cough - continue Advair Diskus - 1 puff twice a day (rinse and gargle after each use).  Events since last clinic visit: She presents today for follow-up visit of cough and asthma.  Patient states that she still has a cough, worsening shortness of breath, sometimes her cough is productive of clear  to white sputum. She still smoking about one cigarette per day. She is accompanied today by her nursing aide. She states that she was re-seen in the hospital for a GI bleed and had a significant drop in her blood counts. See below. Currently her GI bleed has been  stabilized and she is following up with gastroenterology in vascular surgery.  [facility] Hospitalization May 2017 (2 visits) - reviewed by Dr. Stevenson Clinch 1.GI bleed - upper or lower, though her history of recent surgery, emotional stress, and some ibuprofen use might seem to indicate upper GI process.  - EGD normal endoscopy - s/p 6 units of PRBC transfusion and Hb 7.2. No further bleeding but remains at high risk -positive bleeding scan s/p microbead embolization on tertiaryIMA branch into splenic flexureby Dr [name] on 01/12/16 - stable since then.   Medication:   Current Outpatient Rx  Name  Route  Sig  Dispense  Refill  . albuterol (PROVENTIL HFA;VENTOLIN HFA) 108 (90 BASE) MCG/ACT inhaler   Inhalation   Inhale 2 puffs into the lungs every 6 (six) hours as needed for wheezing or shortness of breath.          Marland Kitchen amLODipine (NORVASC) 10 MG tablet   Oral   Take 10 mg by mouth daily.          Marland Kitchen dicyclomine (BENTYL) 10 MG capsule   Oral   Take 1 capsule (10 mg total) by mouth 3 (three) times daily as needed (Abdominal cramping).   10 capsule   0   . efavirenz-emtricitabine-tenofovir (ATRIPLA) 600-200-300 MG per tablet   Oral   Take 1 tablet by mouth at bedtime.         . Fluticasone-Salmeterol (ADVAIR) 250-50 MCG/DOSE AEPB   Inhalation   Inhale 1 puff into the lungs 2 (two) times daily.         . hydrochlorothiazide (HYDRODIURIL) 25 MG tablet   Oral   Take 25 mg by mouth daily.         Marland Kitchen ipratropium-albuterol (DUONEB) 0.5-2.5 (3) MG/3ML SOLN   Inhalation   Inhale 3 mLs into the lungs every 6 (six) hours as needed (for wheezing/shortness of breath).          . losartan (COZAAR) 50 MG tablet   Oral   Take 50 mg by mouth daily.         . mupirocin ointment (BACTROBAN) 2 %   Topical   Apply 1 application topically 2 (two) times daily as needed (for rash).          Marland Kitchen omeprazole (PRILOSEC) 40 MG capsule   Oral   Take 40 mg by mouth 2 (two) times daily as needed (for  acid reflux).         Marland Kitchen oxyCODONE (ROXICODONE) 15 MG immediate release tablet   Oral   Take 15 mg by mouth every 6 (six) hours as needed for pain.         . promethazine (PHENERGAN) 25 MG tablet   Oral   Take 25 mg by mouth every 6 (six) hours as needed for nausea or vomiting.          . valACYclovir (VALTREX) 500 MG tablet   Oral   Take 500 mg by mouth 2 (two) times daily.            Review of Systems  Constitutional: Negative for fever and chills.  Eyes: Negative for blurred vision and double vision.  Respiratory: Positive for cough.   Cardiovascular:  Negative for chest pain and palpitations.  Gastrointestinal: Negative for heartburn.  Genitourinary: Negative for dysuria.  Skin: Negative for rash.  Neurological: Negative for headaches.      Allergies:  Morphine and related; Gabapentin; and Zanaflex   Physical Examination:  VS: There were no vitals taken for this visit.  General Appearance: No distress  HEENT: PERRLA, no ptosis, no other lesions noticed Pulmonary:normal breath sounds., diaphragmatic excursion normal.No wheezing, No rales   Cardiovascular:  Normal S1,S2.  No m/r/g.     Abdomen:Exam: Benign, Soft, non-tender, No masses  Skin:   warm, no rashes, no ecchymosis  Extremities: normal, no cyanosis, clubbing, warm with normal capillary refill.    Radiology: (The following images and results were reviewed by Dr. Stevenson Clinch on 02/11/2016). CXR 07/28/15 CLINICAL DATA: Productive cough for 2 days. Smoking history  EXAM: CHEST 2 VIEW  COMPARISON: 02/01/2015  FINDINGS: Normal heart size and mediastinal contours. No acute infiltrate or edema. Ground-glass opacity seen on chest CT 06/27/2015 would not be visible radiographically. No effusion or pneumothorax.  No acute osseous findings.  IMPRESSION: No evidence acute cardiopulmonary disease.   CT Chest 05/2015 CLINICAL DATA: Acute recurrent cough. HIV.  EXAM: CT CHEST WITH  CONTRAST  TECHNIQUE: Multidetector CT imaging of the chest was performed during intravenous contrast administration.  CONTRAST: 70mL OMNIPAQUE IOHEXOL 300 MG/ML SOLN  COMPARISON: None.  FINDINGS: THORACIC INLET/BODY WALL:  No acute abnormality.  MEDIASTINUM:  Normal heart size. No pericardial effusion. No acute vascular abnormality. No adenopathy.  LUNG WINDOWS:  Patchy and fairly mild apical predominant ground-glass opacities. There is also subpleural reticulation in the apical upper lobes, chronic from 09/30/2011 spine CT. Lung volumes are normal. No cystic changes  UPPER ABDOMEN:  Descending diverticulosis of the colon. Notable aortic atherosclerosis for age.  OSSEOUS:  No acute fracture. No suspicious lytic or blastic lesions. 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  IMPRESSION: Mild biapical ground-glass opacity, favor atypical pneumonia, hypersensitivity pneumonitis, or respiratory bronchiolitis. PCP pneumonia can have this pattern, correlate with CD 4 status.  Labs: Bronch BAL Specimen Description BRONCHIAL WASHINGS   Special Requests Immunocompromised   Gram Stain FEW WBC SEEN  NO ORGANISMS SEEN       Culture RARE GROWTH SERRATIA LIQUEFACIENS   Report Status 07/03/2015 FINAL   Organism ID, Bacteria SERRATIA LIQUEFACIENS          Assessment and Plan:55 year old female with asthma seen for follow-up visit Asthma, chronic Current he Not controlled due to having symptomatic anemia from GI blood loss.   She has been educated on the proper use of albuterol.I believe that her continued smoking is triggering bronchospasms along with recurrent respiratory infections. Today I counseled the patient heavily on quit tobacco and current effects it can have on her current respiratory status.  Also today, educated patient heavily on need and use of a rescue nebulizer and rescue  inhaler along with his current indications and administrations. She does have a history of HIV, currently following with infectious disease, follows up with Dr. [name].  Her Bronch with BAL showed Serratia Liquieformas, rare bacteria, she has followed with Dr. [name] for this. Plan: - albuterol inhaler - 2puff every 3-4 hours as needed for shortness of breath\wheezing\recurrent cough - continue Advair Diskus - 1 puff twice a day (rinse and gargle after each use). - Keep follow up with ID for rare organism on BAL given HIV (+) history.        Cough  The standardized cough guidelines published in Chest by Lissa Morales in 2006 are still the best available and consist of a multiple step process (up to 12!) , not a single office visit,  and are intended  to address this problem logically,  with an alogrithm dependent on response to empiric treatment at  each progressive step  to determine a specific diagnosis with  minimal addtional testing needed. Therefore if adherence is an issue or can't be accurately verified,  it's very unlikely the standard evaluation and treatment will be successful here.    Furthermore, response to therapy (other than acute cough suppression, which should only be used short term with avoidance of narcotic containing cough syrups if possible), can be a gradual process for which the patient may perceive immediate benefit.  At this time I believe that her recurrent cough is due to continued tobacco use.   Plan - cont with Advair. - tobacco cessation        Tobacco abuse counseling Tobacco Cessation - Counseling regarding benefits of smoking cessation strategies was provided for more than 12 min. - Educated that at this time smoking- cessation represents the single most important step that patient can take to enhance the length and quality of live. - Educated patient regarding alternatives of behavior interventions, pharmacotherapy including NRT and  non-nicotine therapy such, and combinations of both. - Patient at this time: Tried to quit on her own, currently down to one cigarette per day.  Symptomatic anemia She admitted for recent GI bleed, found to have an artery bleeding around the splenic flexure. She is now status post micro-beading with no significant bleeding since then. Her hemoglobin is currently stable at 8.2. She is follow-up with GI. Given that she has this level of anemia, I expect that her dyspnea especially with exertion will be worse, I have advised her that use of her rescue nebulizer and inhaler can be using certain instances, however given the level of anemia the effects on rescue inhalers may only be temporary until her anemia is corrected.    Updated Medication List Outpatient Encounter Prescriptions as of 02/11/2016  Medication Sig  . albuterol (PROVENTIL HFA;VENTOLIN HFA) 108 (90 BASE) MCG/ACT inhaler Inhale 2 puffs into the lungs every 6 (six) hours as needed for wheezing or shortness of breath.   Marland Kitchen amLODipine (NORVASC) 10 MG tablet Take 10 mg by mouth daily.   Marland Kitchen dicyclomine (BENTYL) 10 MG capsule Take 1 capsule (10 mg total) by mouth 3 (three) times daily as needed (Abdominal cramping).  Marland Kitchen efavirenz-emtricitabine-tenofovir (ATRIPLA) 600-200-300 MG per tablet Take 1 tablet by mouth at bedtime.  . Fluticasone-Salmeterol (ADVAIR) 250-50 MCG/DOSE AEPB Inhale 1 puff into the lungs 2 (two) times daily.  . hydrochlorothiazide (HYDRODIURIL) 25 MG tablet Take 25 mg by mouth daily.  Marland Kitchen ipratropium-albuterol (DUONEB) 0.5-2.5 (3) MG/3ML SOLN Inhale 3 mLs into the lungs every 6 (six) hours as needed (for wheezing/shortness of breath).   . losartan (COZAAR) 50 MG tablet Take 50 mg by mouth daily.  . mupirocin ointment (BACTROBAN) 2 % Apply 1 application topically 2 (two) times daily as needed (for rash).   Marland Kitchen omeprazole (PRILOSEC) 40 MG capsule Take 40 mg by mouth 2 (two) times daily as needed (for acid reflux).  Marland Kitchen oxyCODONE  (ROXICODONE) 15 MG immediate release tablet Take 15 mg by mouth every 6 (six) hours as needed for pain.  . promethazine (PHENERGAN) 25 MG tablet Take 25 mg by mouth every 6 (six) hours as needed for nausea  or vomiting.   . valACYclovir (VALTREX) 500 MG tablet Take 500 mg by mouth 2 (two) times daily.   No facility-administered encounter medications on file as of 02/11/2016.    Orders for this visit: No orders of the defined types were placed in this encounter.    Thank  you for the visitation and for allowing  [facility] to assist in the care of your patient. Our recommendations are noted above.  Please contact us if we can be of further service.  Vilinda Boehringer, MD [facility] Office Number: [phone]

## 2016-02-11 NOTE — Assessment & Plan Note (Signed)
The standardized cough guidelines published in Chest by Lissa Morales in 2006 are still the best available and consist of a multiple step process (up to 12!) , not a single office visit,  and are intended  to address this problem logically,  with an alogrithm dependent on response to empiric treatment at  each progressive step  to determine a specific diagnosis with  minimal addtional testing needed. Therefore if adherence is an issue or can't be accurately verified,  it's very unlikely the standard evaluation and treatment will be successful here.    Furthermore, response to therapy (other than acute cough suppression, which should only be used short term with avoidance of narcotic containing cough syrups if possible), can be a gradual process for which the patient may perceive immediate benefit.  At this time I believe that her recurrent cough is due to continued tobacco use.   Plan - cont with Advair. - tobacco cessation

## 2016-02-11 NOTE — Assessment & Plan Note (Signed)
She admitted for recent GI bleed, found to have an artery bleeding around the splenic flexure. She is now status post micro-beading with no significant bleeding since then. Her hemoglobin is currently stable at 8.2. She is follow-up with GI. Given that she has this level of anemia, I expect that her dyspnea especially with exertion will be worse, I have advised her that use of her rescue nebulizer and inhaler can be using certain instances, however given the level of anemia the effects on rescue inhalers may only be temporary until her anemia is corrected.

## 2016-02-11 NOTE — Assessment & Plan Note (Signed)
Tobacco Cessation - Counseling regarding benefits of smoking cessation strategies was provided for more than 12 min. - Educated that at this time smoking- cessation represents the single most important step that patient can take to enhance the length and quality of live. - Educated patient regarding alternatives of behavior interventions, pharmacotherapy including NRT and non-nicotine therapy such, and combinations of both. - Patient at this time: Tried to quit on her own, currently down to one cigarette per day.

## 2016-03-16 ENCOUNTER — Other Ambulatory Visit: Payer: Self-pay | Admitting: Neurosurgery

## 2016-03-16 DIAGNOSIS — S32008K Other fracture of unspecified lumbar vertebra, subsequent encounter for fracture with nonunion: Secondary | ICD-10-CM

## 2016-03-26 ENCOUNTER — Ambulatory Visit
Admission: RE | Admit: 2016-03-26 | Discharge: 2016-03-26 | Disposition: A | Discharge status: Home / Self Care | Payer: Medicare Other | Source: Ambulatory Visit | Attending: Neurosurgery | Admitting: Neurosurgery

## 2016-03-26 DIAGNOSIS — X58XXXD Exposure to other specified factors, subsequent encounter: Secondary | ICD-10-CM | POA: Insufficient documentation

## 2016-03-26 DIAGNOSIS — S32008K Other fracture of unspecified lumbar vertebra, subsequent encounter for fracture with nonunion: Secondary | ICD-10-CM | POA: Insufficient documentation

## 2016-03-26 DIAGNOSIS — M5137 Other intervertebral disc degeneration, lumbosacral region: Secondary | ICD-10-CM | POA: Diagnosis not present

## 2016-04-15 ENCOUNTER — Other Ambulatory Visit: Payer: Self-pay | Admitting: Gastroenterology

## 2016-04-15 DIAGNOSIS — D649 Anemia, unspecified: Secondary | ICD-10-CM

## 2016-04-15 DIAGNOSIS — R195 Other fecal abnormalities: Secondary | ICD-10-CM

## 2016-04-23 ENCOUNTER — Ambulatory Visit: Payer: Medicare Other | Attending: Gastroenterology

## 2016-05-04 ENCOUNTER — Other Ambulatory Visit: Payer: Self-pay | Admitting: Family Medicine

## 2016-05-04 ENCOUNTER — Ambulatory Visit
Admission: RE | Admit: 2016-05-04 | Discharge: 2016-05-04 | Disposition: A | Discharge status: Home / Self Care | Payer: Medicare Other | Source: Ambulatory Visit | Attending: Family Medicine | Admitting: Family Medicine

## 2016-05-04 DIAGNOSIS — Z1231 Encounter for screening mammogram for malignant neoplasm of breast: Secondary | ICD-10-CM | POA: Insufficient documentation

## 2016-05-04 DIAGNOSIS — Z1239 Encounter for other screening for malignant neoplasm of breast: Secondary | ICD-10-CM

## 2016-05-14 ENCOUNTER — Ambulatory Visit: Payer: Medicare Other | Admitting: Internal Medicine

## 2016-05-27 ENCOUNTER — Encounter: Payer: Self-pay | Admitting: Internal Medicine

## 2016-05-27 ENCOUNTER — Ambulatory Visit (INDEPENDENT_AMBULATORY_CARE_PROVIDER_SITE_OTHER): Payer: Medicare Other | Admitting: Internal Medicine

## 2016-05-27 VITALS — BP 126/72 | HR 87 | Ht 66.0 in | Wt 275.0 lb

## 2016-05-27 DIAGNOSIS — J309 Allergic rhinitis, unspecified: Secondary | ICD-10-CM | POA: Insufficient documentation

## 2016-05-27 DIAGNOSIS — J302 Other seasonal allergic rhinitis: Secondary | ICD-10-CM

## 2016-05-27 DIAGNOSIS — J4540 Moderate persistent asthma, uncomplicated: Secondary | ICD-10-CM | POA: Diagnosis not present

## 2016-05-27 DIAGNOSIS — R05 Cough: Secondary | ICD-10-CM

## 2016-05-27 DIAGNOSIS — Z720 Tobacco use: Secondary | ICD-10-CM

## 2016-05-27 DIAGNOSIS — R053 Chronic cough: Secondary | ICD-10-CM

## 2016-05-27 DIAGNOSIS — F17210 Nicotine dependence, cigarettes, uncomplicated: Secondary | ICD-10-CM

## 2016-05-27 MED ORDER — FLUTICASONE-SALMETEROL 500-50 MCG/DOSE IN AEPB: 1.0000 | INHALATION_SPRAY | Freq: Two times a day (BID) | RESPIRATORY_TRACT | 5 refills | Status: DC

## 2016-05-27 NOTE — Assessment & Plan Note (Signed)
Currently Not well controlled due to non compliance and environmental triggers.    She has been educated on the proper use of albuterol.I believe that her continued smoking is triggering bronchospasms along with recurrent respiratory infections. Today I counseled the patient heavily on quit tobacco and current effects it can have on her current respiratory status.  Also today, educated patient heavily on need and use of a rescue nebulizer and rescue inhaler along with his current indications and administrations. She does have a history of HIV, currently following with infectious disease, follows up with Dr. [name].  Her Bronch with BAL showed Serratia Liquieformas, rare bacteria, she has followed with Dr. [name] for this  In 2016.  She has not been well controlled over the past 1 month, and using albuterol daily over the past 2 weeks, we discussed adding Flonase and increasing Advair to 500/50.  She is having some rhinitis especially at night which could be causing cough and bronchospams.    Plan: - albuterol inhaler - 2puff every 3-4 hours as needed for shortness of breath\wheezing\recurrent cough - Advair Diskus 500/50 - 1 puff twice a day (rinse and gargle after each use). - Flonase 25mcg/actuation - 1 puff to ea nostril at night, do not use for more than 3 days.

## 2016-05-27 NOTE — Patient Instructions (Addendum)
Follow up with Dr. Stevenson Clinch in:3 months - complete out your current dose of Advair 250/50, then start your new Advair prescription - Advair 500/50 - 1 puff BID. -gargle and rinse after each use.  - albuterol inhaler - 2puff every 3-4 hours as needed for shortness of breath\wheezing\recurrent cough - cont with tobacco cessation - cont with diet and exercise as tolerated.  - Flonase 38mcg/actuation - 1 puff to ea nostril at night, do not use for more than 3 days.

## 2016-05-27 NOTE — Progress Notes (Signed)
Melanie Bowen Consultation      MRN# [id number] ANALISSA Bowen 22-Apr-1961   CC: Chief Complaint  Patient presents with  . Follow-up    35mo rov      Brief History: 55 year old female past medical history of asthma, HIV, seen in consultation today for recurrent bronchitis/cough/orders of breath.Bronch with BAL showed Serratia and rare microbes, seen ID and treated. Now still with recurrent cough, but due to continued smoking and medication non-compliance.   Events since last clinic visit: She presents today for follow-up visit of cough and asthma.  Patient states that she still has a cough, and still smoking about one cigarette per day which is significant a down from her last visit. She describes her cough is dry to mild intermittent production of clear sputum. Over the last 2 week she's been using albuterol rescue inhaler daily at least twice a day followed by a albuterol nebulizer treatment. Today she is accompanied by her new social aid. Overall prior to this recent flareup of her asthma she has been doing fairly well. At this time I believe that medication compliance and adherence to tobacco cessation is paramount to her overall health, and I stressed this to at today's visit. Given that she's having recurrent use of rescue inhaler and clinical symptoms we have decided to increase her Advair dose and start her on a nasal steroid today. She also complains of runny nose and worsening cough in late evening and at night. She also states since her last visit she's had lumbar fusion surgery.   Medication:    Current Outpatient Prescriptions:  .  albuterol (PROVENTIL HFA;VENTOLIN HFA) 108 (90 BASE) MCG/ACT inhaler, Inhale 2 puffs into the lungs every 6 (six) hours as needed for wheezing or shortness of breath. , Disp: , Rfl:  .  amLODipine (NORVASC) 10 MG tablet, Take 10 mg by mouth daily. , Disp: , Rfl:  .  dicyclomine (BENTYL) 10 MG capsule, Take 1 capsule (10 mg  total) by mouth 3 (three) times daily as needed (Abdominal cramping)., Disp: 10 capsule, Rfl: 0 .  efavirenz-emtricitabine-tenofovir (ATRIPLA) 600-200-300 MG per tablet, Take 1 tablet by mouth at bedtime., Disp: , Rfl:  .  Fluticasone-Salmeterol (ADVAIR) 250-50 MCG/DOSE AEPB, Inhale 1 puff into the lungs 2 (two) times daily., Disp: , Rfl:  .  hydrochlorothiazide (HYDRODIURIL) 25 MG tablet, Take 25 mg by mouth daily., Disp: , Rfl:  .  ipratropium-albuterol (DUONEB) 0.5-2.5 (3) MG/3ML SOLN, Inhale 3 mLs into the lungs every 6 (six) hours as needed (for wheezing/shortness of breath). , Disp: , Rfl:  .  losartan (COZAAR) 50 MG tablet, Take 50 mg by mouth daily., Disp: , Rfl:  .  mupirocin ointment (BACTROBAN) 2 %, Apply 1 application topically 2 (two) times daily as needed (for rash). , Disp: , Rfl:  .  omeprazole (PRILOSEC) 40 MG capsule, Take 40 mg by mouth 2 (two) times daily as needed (for acid reflux)., Disp: , Rfl:  .  oxyCODONE (ROXICODONE) 15 MG immediate release tablet, Take 15 mg by mouth every 6 (six) hours as needed for pain., Disp: , Rfl:  .  promethazine (PHENERGAN) 25 MG tablet, Take 25 mg by mouth every 6 (six) hours as needed for nausea or vomiting. , Disp: , Rfl:  .  valACYclovir (VALTREX) 500 MG tablet, Take 500 mg by mouth 2 (two) times daily., Disp: , Rfl:     Review of Systems  Constitutional: Negative for chills and fever.  HENT: Positive for congestion.  Eyes: Negative for blurred vision and double vision.  Respiratory: Positive for cough, sputum production and shortness of breath.   Cardiovascular: Negative for chest pain and palpitations.  Gastrointestinal: Negative for heartburn.  Genitourinary: Negative for dysuria.  Skin: Negative for rash.  Neurological: Negative for headaches.      Allergies:  Morphine and related; Gabapentin; and Zanaflex  [tizanidine]  Physical Examination:  VS: BP 126/72 (BP Location: Left Arm, Cuff Size: Normal)   Pulse 87   Ht 5\' 6"   (1.676 m)   Wt 275 lb (124.7 kg)   SpO2 99%   BMI 44.39 kg/m   General Appearance: No distress  HEENT: PERRLA, no ptosis, no other lesions noticed Pulmonary:normal breath sounds., diaphragmatic excursion normal.No wheezing, No rales   Cardiovascular:  Normal S1,S2.  No m/r/g.     Abdomen:Exam: Benign, Soft, non-tender, No masses  Skin:   warm, no rashes, no ecchymosis  Extremities: normal, no cyanosis, clubbing, warm with normal capillary refill.     Labs: Bronch BAL 05/2015 Specimen Description BRONCHIAL WASHINGS   Special Requests Immunocompromised   Gram Stain FEW WBC SEEN  NO ORGANISMS SEEN       Culture RARE GROWTH SERRATIA LIQUEFACIENS   Report Status 07/03/2015 FINAL   Organism ID, Bacteria SERRATIA LIQUEFACIENS          Assessment and Plan:55 year old female with asthma seen for follow-up visit Asthma, chronic Currently Not well controlled due to non compliance and environmental triggers.    She has been educated on the proper use of albuterol.I believe that her continued smoking is triggering bronchospasms along with recurrent respiratory infections. Today I counseled the patient heavily on quit tobacco and current effects it can have on her current respiratory status.  Also today, educated patient heavily on need and use of a rescue nebulizer and rescue inhaler along with his current indications and administrations. She does have a history of HIV, currently following with infectious disease, follows up with Dr. [name].  Her Bronch with BAL showed Serratia Liquieformas, rare bacteria, she has followed with Dr. [name] for this  In 2016.  She has not been well controlled over the past 1 month, and using albuterol daily over the past 2 weeks, we discussed adding Flonase and increasing Advair to 500/50.  She is having some rhinitis especially at night which could be causing cough and bronchospams.    Plan: - albuterol inhaler - 2puff every 3-4 hours as  needed for shortness of breath\wheezing\recurrent cough - Advair Diskus 500/50 - 1 puff twice a day (rinse and gargle after each use). - Flonase 2mcg/actuation - 1 puff to ea nostril at night, do not use for more than 3 days.   Allergic rhinitis Rhinitis especially at night, triggering bronchospams.  Plan; Flonase 29mcg/actuation - 1 puff to ea nostril at night, do not use for more than 3 days.    Updated Medication List Outpatient Encounter Prescriptions as of 05/27/2016  Medication Sig  . albuterol (PROVENTIL HFA;VENTOLIN HFA) 108 (90 BASE) MCG/ACT inhaler Inhale 2 puffs into the lungs every 6 (six) hours as needed for wheezing or shortness of breath.   Marland Kitchen amLODipine (NORVASC) 10 MG tablet Take 10 mg by mouth daily.   Marland Kitchen dicyclomine (BENTYL) 10 MG capsule Take 1 capsule (10 mg total) by mouth 3 (three) times daily as needed (Abdominal cramping).  Marland Kitchen efavirenz-emtricitabine-tenofovir (ATRIPLA) 600-200-300 MG per tablet Take 1 tablet by mouth at bedtime.  . Fluticasone-Salmeterol (ADVAIR) 250-50 MCG/DOSE AEPB Inhale 1 puff into the  lungs 2 (two) times daily.  . hydrochlorothiazide (HYDRODIURIL) 25 MG tablet Take 25 mg by mouth daily.  Marland Kitchen ipratropium-albuterol (DUONEB) 0.5-2.5 (3) MG/3ML SOLN Inhale 3 mLs into the lungs every 6 (six) hours as needed (for wheezing/shortness of breath).   . losartan (COZAAR) 50 MG tablet Take 50 mg by mouth daily.  . mupirocin ointment (BACTROBAN) 2 % Apply 1 application topically 2 (two) times daily as needed (for rash).   Marland Kitchen omeprazole (PRILOSEC) 40 MG capsule Take 40 mg by mouth 2 (two) times daily as needed (for acid reflux).  Marland Kitchen oxyCODONE (ROXICODONE) 15 MG immediate release tablet Take 15 mg by mouth every 6 (six) hours as needed for pain.  . promethazine (PHENERGAN) 25 MG tablet Take 25 mg by mouth every 6 (six) hours as needed for nausea or vomiting.   . valACYclovir (VALTREX) 500 MG tablet Take 500 mg by mouth 2 (two) times daily.   No  facility-administered encounter medications on file as of 05/27/2016.     Orders for this visit: No orders of the defined types were placed in this encounter.   Thank  you for the visitation and for allowing  [facility] to assist in the care of your patient. Our recommendations are noted above.  Please contact us if we can be of further service.  Vilinda Boehringer, MD  Pulmonary and Critical Care Office Number: [phone]

## 2016-05-27 NOTE — Assessment & Plan Note (Signed)
Rhinitis especially at night, triggering bronchospams.  Plan; Flonase 36mcg/actuation - 1 puff to ea nostril at night, do not use for more than 3 days.

## 2016-06-04 ENCOUNTER — Other Ambulatory Visit: Payer: Self-pay | Admitting: Neurosurgery

## 2016-06-04 DIAGNOSIS — S32009K Unspecified fracture of unspecified lumbar vertebra, subsequent encounter for fracture with nonunion: Secondary | ICD-10-CM

## 2016-07-26 ENCOUNTER — Ambulatory Visit
Admission: RE | Admit: 2016-07-26 | Discharge: 2016-07-26 | Disposition: A | Discharge status: Home / Self Care | Payer: Medicare Other | Source: Ambulatory Visit | Attending: Neurosurgery | Admitting: Neurosurgery

## 2016-07-26 DIAGNOSIS — X58XXXD Exposure to other specified factors, subsequent encounter: Secondary | ICD-10-CM | POA: Diagnosis not present

## 2016-07-26 DIAGNOSIS — M5137 Other intervertebral disc degeneration, lumbosacral region: Secondary | ICD-10-CM | POA: Diagnosis not present

## 2016-07-26 DIAGNOSIS — S32009K Unspecified fracture of unspecified lumbar vertebra, subsequent encounter for fracture with nonunion: Secondary | ICD-10-CM

## 2016-07-26 DIAGNOSIS — Z981 Arthrodesis status: Secondary | ICD-10-CM | POA: Diagnosis not present

## 2016-08-01 ENCOUNTER — Emergency Department: Payer: Medicare Other

## 2016-08-01 ENCOUNTER — Emergency Department
Admission: EM | Admit: 2016-08-01 | Discharge: 2016-08-01 | Disposition: A | Discharge status: Home / Self Care | Payer: Medicare Other | Attending: Emergency Medicine | Admitting: Emergency Medicine

## 2016-08-01 DIAGNOSIS — F17210 Nicotine dependence, cigarettes, uncomplicated: Secondary | ICD-10-CM | POA: Diagnosis not present

## 2016-08-01 DIAGNOSIS — I10 Essential (primary) hypertension: Secondary | ICD-10-CM | POA: Diagnosis not present

## 2016-08-01 DIAGNOSIS — J449 Chronic obstructive pulmonary disease, unspecified: Secondary | ICD-10-CM | POA: Diagnosis not present

## 2016-08-01 DIAGNOSIS — Z79899 Other long term (current) drug therapy: Secondary | ICD-10-CM | POA: Diagnosis not present

## 2016-08-01 DIAGNOSIS — J9801 Acute bronchospasm: Secondary | ICD-10-CM | POA: Insufficient documentation

## 2016-08-01 DIAGNOSIS — R0602 Shortness of breath: Secondary | ICD-10-CM | POA: Diagnosis present

## 2016-08-01 DIAGNOSIS — Z21 Asymptomatic human immunodeficiency virus [HIV] infection status: Secondary | ICD-10-CM | POA: Insufficient documentation

## 2016-08-01 HISTORY — DX: Chronic obstructive pulmonary disease, unspecified: J44.9

## 2016-08-01 LAB — COMPREHENSIVE METABOLIC PANEL
ALT: 16 U/L (ref 14–54)
AST: 20 U/L (ref 15–41)
Albumin: 3.2 g/dL — ABNORMAL LOW (ref 3.5–5.0)
Alkaline Phosphatase: 77 U/L (ref 38–126)
Anion gap: 6 (ref 5–15)
BUN: 19 mg/dL (ref 6–20)
CO2: 23 mmol/L (ref 22–32)
Calcium: 8.6 mg/dL — ABNORMAL LOW (ref 8.9–10.3)
Chloride: 109 mmol/L (ref 101–111)
Creatinine, Ser: 0.89 mg/dL (ref 0.44–1.00)
GFR calc Af Amer: >60 mL/min (ref >60)
GFR calc non Af Amer: >60 mL/min (ref >60)
Glucose, Bld: 99 mg/dL (ref 65–99)
Potassium: 3.8 mmol/L (ref 3.5–5.1)
Sodium: 138 mmol/L (ref 135–145)
Total Bilirubin: 0.1 mg/dL — ABNORMAL LOW (ref 0.3–1.2)
Total Protein: 6.6 g/dL (ref 6.5–8.1)

## 2016-08-01 LAB — CBC
HCT: 32.1 % — ABNORMAL LOW (ref 35.0–47.0)
Hemoglobin: 10.4 g/dL — ABNORMAL LOW (ref 12.0–16.0)
MCH: 26.4 pg (ref 26.0–34.0)
MCHC: 32.4 g/dL (ref 32.0–36.0)
MCV: 81.6 fL (ref 80.0–100.0)
Platelets: 353 10*3/uL (ref 150–440)
RBC: 3.93 MIL/uL (ref 3.80–5.20)
RDW: 17.9 % — ABNORMAL HIGH (ref 11.5–14.5)
WBC: 8.2 10*3/uL (ref 3.6–11.0)

## 2016-08-01 LAB — TROPONIN I: Troponin I: <0.03 ng/mL (ref <0.03)

## 2016-08-01 LAB — LIPASE, BLOOD: Lipase: 31 U/L (ref 11–51)

## 2016-08-01 MED ORDER — IPRATROPIUM-ALBUTEROL 0.5-2.5 (3) MG/3ML IN SOLN
3.0000 mL | Freq: Once | RESPIRATORY_TRACT | Status: AC
  Administered 2016-08-01: 3 mL via RESPIRATORY_TRACT
  Filled 2016-08-01: qty 3

## 2016-08-01 MED ORDER — ALBUTEROL SULFATE (2.5 MG/3ML) 0.083% IN NEBU
INHALATION_SOLUTION | RESPIRATORY_TRACT | Status: AC
  Filled 2016-08-01: qty 6

## 2016-08-01 MED ORDER — PREDNISONE 50 MG PO TABS: 50.0000 mg | ORAL_TABLET | Freq: Every day | ORAL | 0 refills | Status: DC

## 2016-08-01 MED ORDER — ALBUTEROL SULFATE (2.5 MG/3ML) 0.083% IN NEBU
5.0000 mg | INHALATION_SOLUTION | Freq: Once | RESPIRATORY_TRACT | Status: AC
  Administered 2016-08-01: 5 mg via RESPIRATORY_TRACT

## 2016-08-01 NOTE — ED Notes (Signed)
Pt given crackers and drink 

## 2016-08-01 NOTE — ED Notes (Signed)
Pt transported to xray 

## 2016-08-01 NOTE — ED Notes (Signed)
Pt in room coughing up sputum and making her gag. Pt states she feels sick on her stomach from it. Pt is not actively vomiting.

## 2016-08-01 NOTE — ED Triage Notes (Signed)
Pt arrives to ER via POV c/o SOB X a few days, worsening this AM and diarrhea. Pt hx of COPD and asthma. Pt appears anxious, color WNL.

## 2016-08-01 NOTE — ED Provider Notes (Signed)
[facility] Emergency Department Provider Note   ____________________________________________    I have reviewed the triage vital signs and the nursing notes.   HISTORY  Chief Complaint Shortness of Breath and Diarrhea     HPI Melanie Bowen is a 55 y.o. female who presents with complaints of shortness of breath. Patient reports over the last several days she has had a cough and sore throat and today she felt that her work of breathingwas increased. She reports compliance with her medications. She denies fevers or chills. No chest pain. No recent travel. No calf pain. No swelling.   Past Medical History:  Diagnosis Date  . Allergic rhinitis   . Arthritis    DDD  . Asthma   . Blepharitis   . Bronchitis   . Cigarette smoker   . Claustrophobia   . COPD (chronic obstructive pulmonary disease) ([place])   . Cough   . Depression   . Eye irritation   . Genital herpes   . GERD (gastroesophageal reflux disease)   . Headache   . HIV (human immunodeficiency virus infection) ([place])   . Hypertension   . Paresthesia of hand, bilateral   . Shortness of breath dyspnea    at times due to asthma    Patient Active Problem List   Diagnosis Date Noted  . Allergic rhinitis 05/27/2016  . Tobacco abuse counseling 02/11/2016  . Symptomatic anemia   . Lower GI bleed   . Anemia 01/16/2016  . GI bleed 01/10/2016  . GERD (gastroesophageal reflux disease) 01/10/2016  . HTN (hypertension) 01/10/2016  . Depression 01/10/2016  . HIV (human immunodeficiency virus infection) ([place]) 01/10/2016  . Lumbar pseudoarthrosis 10/31/2015  . Chronic cough   . Cough 06/19/2015  . DDD (degenerative disc disease), lumbar 12/31/2014  . Degenerative disc disease, lumbar 12/31/2014  . Asthma, chronic 11/12/2014    Past Surgical History:  Procedure Laterality Date  . BACK SURGERY    . back surgery may 2016     lumbar   . BUNIONECTOMY Bilateral    feet  . DIAGNOSTIC  LAPAROSCOPY    . ESOPHAGOGASTRODUODENOSCOPY Left 01/11/2016   Procedure: ESOPHAGOGASTRODUODENOSCOPY (EGD);  Surgeon: Hulen Luster, MD;  Location: [facility] ENDOSCOPY;  Service: Endoscopy;  Laterality: Left;  . Finger fracture Right    5th finger  . HAND SURGERY Bilateral    carpal tunnel  . JOINT REPLACEMENT Bilateral    knees  . KNEE SURGERY Bilateral 2003,2007   total Joint  . LAPAROSCOPY FOR ECTOPIC PREGNANCY    . PERIPHERAL VASCULAR CATHETERIZATION N/A 01/12/2016   Procedure: Visceral Angiography;  Surgeon: Algernon Huxley, MD;  Location: [place] [facility];  Service: Cardiovascular;  Laterality: N/A;  . PERIPHERAL VASCULAR CATHETERIZATION N/A 01/12/2016   Procedure: Visceral Artery Intervention;  Surgeon: Algernon Huxley, MD;  Location: [place] [facility];  Service: Cardiovascular;  Laterality: N/A;  . TUBAL LIGATION    . VIDEO BRONCHOSCOPY N/A 06/30/2015   Procedure: VIDEO BRONCHOSCOPY WITHOUT FLUORO;  Surgeon: Vilinda Boehringer, MD;  Location: [facility] ORS;  Service: Cardiopulmonary;  Laterality: N/A;    Prior to Admission medications   Medication Sig Start Date End Date Taking? Authorizing Provider  albuterol (PROVENTIL HFA;VENTOLIN HFA) 108 (90 BASE) MCG/ACT inhaler Inhale 2 puffs into the lungs every 6 (six) hours as needed for wheezing or shortness of breath.     Historical Provider, MD  amLODipine (NORVASC) 10 MG tablet Take 10 mg by mouth daily.  Historical Provider, MD  dicyclomine (BENTYL) 10 MG capsule Take 1 capsule (10 mg total) by mouth 3 (three) times daily as needed (Abdominal cramping). 01/18/16   Max Sane, MD  efavirenz-emtricitabine-tenofovir (ATRIPLA) 211-941-740 MG per tablet Take 1 tablet by mouth at bedtime.    Historical Provider, MD  Fluticasone-Salmeterol (ADVAIR DISKUS) 500-50 MCG/DOSE AEPB Inhale 1 puff into the lungs 2 (two) times daily. 05/27/16   Vishal Mungal, MD  Fluticasone-Salmeterol (ADVAIR) 250-50 MCG/DOSE AEPB Inhale 1 puff into the lungs 2 (two) times daily.     Historical Provider, MD  hydrochlorothiazide (HYDRODIURIL) 25 MG tablet Take 25 mg by mouth daily.    Historical Provider, MD  ipratropium-albuterol (DUONEB) 0.5-2.5 (3) MG/3ML SOLN Inhale 3 mLs into the lungs every 6 (six) hours as needed (for wheezing/shortness of breath).     Historical Provider, MD  losartan (COZAAR) 50 MG tablet Take 50 mg by mouth daily.    Historical Provider, MD  mupirocin ointment (BACTROBAN) 2 % Apply 1 application topically 2 (two) times daily as needed (for rash).     Historical Provider, MD  omeprazole (PRILOSEC) 40 MG capsule Take 40 mg by mouth 2 (two) times daily as needed (for acid reflux).    Historical Provider, MD  oxyCODONE (ROXICODONE) 15 MG immediate release tablet Take 15 mg by mouth every 6 (six) hours as needed for pain.    Historical Provider, MD  promethazine (PHENERGAN) 25 MG tablet Take 25 mg by mouth every 6 (six) hours as needed for nausea or vomiting.     Historical Provider, MD  valACYclovir (VALTREX) 500 MG tablet Take 500 mg by mouth 2 (two) times daily.    Historical Provider, MD     Allergies Morphine and related; Gabapentin; and Zanaflex  [tizanidine]  Family History  Problem Relation Age of Onset  . Breast cancer Sister     Social History Social History  Substance Use Topics  . Smoking status: Light Tobacco Smoker    Years: 38.00    Types: Cigarettes  . Smokeless tobacco: Never Used     Comment: 1 cig daily  . Alcohol use 3.0 oz/week    5 Glasses of wine per week     Comment: wine - couple days of week    Review of Systems  Constitutional: No fever/chills Eyes: No visual changes.   Cardiovascular: Denies chest pain. Respiratory: As above Gastrointestinal: No abdominal pain.   Genitourinary: Negative for dysuria. Musculoskeletal: Negative for back pain. Skin: Negative for rash. Neurological: Negative for headaches   10-point ROS otherwise negative.  ____________________________________________   PHYSICAL  EXAM:  VITAL SIGNS: ED Triage Vitals  Enc Vitals Group     BP 08/01/16 0704 134/72     Pulse Rate 08/01/16 0703 82     Resp 08/01/16 0703 18     Temp 08/01/16 0703 98 F (36.7 C)     Temp Source 08/01/16 0703 Oral     SpO2 08/01/16 0703 100 %     Weight 08/01/16 0703 278 lb (126.1 kg)     Height 08/01/16 0703 5\' 6"  (1.676 m)     Head Circumference --      Peak Flow --      Pain Score 08/01/16 0703 9     Pain Loc --      Pain Edu? --      Excl. in [place]? --     Constitutional: Alert and oriented. No acute distress. Pleasant and interactive Eyes: Conjunctivae are normal.  Nose: No congestion/rhinnorhea. Mouth/Throat: Mucous membranes are moist.    Cardiovascular: Normal rate, regular rhythm. Grossly normal heart sounds.  Good peripheral circulation. Respiratory: Normal respiratory effort.  No retractions. Scattered mild wheezes Gastrointestinal: Soft and nontender. No distention.  No CVA tenderness. Genitourinary: deferred Musculoskeletal: No lower extremity tenderness nor edema.  Warm and well perfused Neurologic:  Normal speech and language. No gross focal neurologic deficits are appreciated.  Skin:  Skin is warm, dry and intact. No rash noted. Psychiatric: Mood and affect are normal. Speech and behavior are normal.  ____________________________________________   LABS (all labs ordered are listed, but only abnormal results are displayed)  Labs Reviewed  COMPREHENSIVE METABOLIC PANEL - Abnormal; Notable for the following:       Result Value   Calcium 8.6 (*)    Albumin 3.2 (*)    Total Bilirubin 0.1 (*)    All other components within normal limits  CBC - Abnormal; Notable for the following:    Hemoglobin 10.4 (*)    HCT 32.1 (*)    RDW 17.9 (*)    All other components within normal limits  LIPASE, BLOOD  TROPONIN I  URINALYSIS COMPLETEWITH MICROSCOPIC ([facility] ONLY)   ____________________________________________  EKG  ED ECG REPORT I, Lavonia Drafts, the  attending physician, personally viewed and interpreted this ECG.  Date: 08/01/2016 EKG Time: 7:08 AM Rate: 73 Rhythm: normal sinus rhythm QRS Axis: normal Intervals: normal ST/T Wave abnormalities: normal Conduction Disturbances: none Narrative Interpretation: unremarkable  ____________________________________________  RADIOLOGY  Chest x-ray unremarkable ____________________________________________   PROCEDURES  Procedure(s) performed: No    Critical Care performed: No ____________________________________________   INITIAL IMPRESSION / ASSESSMENT AND PLAN / ED COURSE  Pertinent labs & imaging results that were available during my care of the patient were reviewed by me and considered in my medical decision making (see chart for details).  Patient well-appearing in no distress. No increased work breathing noted. She does have scattered mild wheezes on exam, she is a smoker. We will treat with nebulizers, check labs and x-ray reevaluate.  Clinical Course   Patient reports she is feeling much better and is anxious to leave. Her workup is unremarkable. Suspect mild bronchospasm, counseling the patient on smoking cessation. I'll prescribe prednisone. We discussed return precautions ____________________________________________   FINAL CLINICAL IMPRESSION(S) / ED DIAGNOSES  Final diagnoses:  Bronchospasm, acute      NEW MEDICATIONS STARTED DURING THIS VISIT:  New Prescriptions   No medications on file     Note:  This document was prepared using Dragon voice recognition software and may include unintentional dictation errors.    Lavonia Drafts, MD 08/01/16 1106

## 2016-08-28 ENCOUNTER — Emergency Department
Admission: EM | Admit: 2016-08-28 | Discharge: 2016-08-28 | Disposition: A | Discharge status: Home / Self Care | Payer: Medicare Other | Attending: Emergency Medicine | Admitting: Emergency Medicine

## 2016-08-28 DIAGNOSIS — I10 Essential (primary) hypertension: Secondary | ICD-10-CM | POA: Diagnosis not present

## 2016-08-28 DIAGNOSIS — J029 Acute pharyngitis, unspecified: Secondary | ICD-10-CM

## 2016-08-28 DIAGNOSIS — F17210 Nicotine dependence, cigarettes, uncomplicated: Secondary | ICD-10-CM | POA: Diagnosis not present

## 2016-08-28 DIAGNOSIS — F458 Other somatoform disorders: Secondary | ICD-10-CM | POA: Insufficient documentation

## 2016-08-28 DIAGNOSIS — Z21 Asymptomatic human immunodeficiency virus [HIV] infection status: Secondary | ICD-10-CM | POA: Insufficient documentation

## 2016-08-28 DIAGNOSIS — Z79899 Other long term (current) drug therapy: Secondary | ICD-10-CM | POA: Insufficient documentation

## 2016-08-28 DIAGNOSIS — J45909 Unspecified asthma, uncomplicated: Secondary | ICD-10-CM | POA: Insufficient documentation

## 2016-08-28 DIAGNOSIS — R09A2 Foreign body sensation, throat: Secondary | ICD-10-CM

## 2016-08-28 MED ORDER — PREDNISONE 10 MG (21) PO TBPK: ORAL_TABLET | ORAL | 0 refills | Status: DC

## 2016-08-28 MED ORDER — AMOXICILLIN 500 MG PO TABS: 500.0000 mg | ORAL_TABLET | Freq: Three times a day (TID) | ORAL | 0 refills | Status: DC

## 2016-08-28 MED ORDER — GUAIFENESIN-CODEINE 100-10 MG/5ML PO SOLN: 10.0000 mL | Freq: Three times a day (TID) | ORAL | 0 refills | Status: DC | PRN

## 2016-08-28 NOTE — ED Provider Notes (Signed)
[facility] Emergency Department Provider Note  ____________________________________________  Time seen: Approximately 4:17 PM  I have reviewed the triage vital signs and the nursing notes.   HISTORY  Chief Complaint Sore Throat    HPI Melanie Bowen is a 55 y.o. female who presents to the emergency department for evaluation of congestion in her throat that makes her feel as if her throat is closing. She denies any type of allergic reaction or rash. She states that once she clears her throat, she does feel that her throat was closing any longer. She states that she always feels a "lump" in her throat and states that her voice has been hoarse the last couple of weeks. She has taken some over-the-counter medications without any relief. She also reports having had frequent cough without fever.  Past Medical History:  Diagnosis Date  . Allergic rhinitis   . Arthritis    DDD  . Asthma   . Blepharitis   . Bronchitis   . Cigarette smoker   . Claustrophobia   . COPD (chronic obstructive pulmonary disease) ([place])   . Cough   . Depression   . Eye irritation   . Genital herpes   . GERD (gastroesophageal reflux disease)   . Headache   . HIV (human immunodeficiency virus infection) ([place])   . Hypertension   . Paresthesia of hand, bilateral   . Shortness of breath dyspnea    at times due to asthma    Patient Active Problem List   Diagnosis Date Noted  . Allergic rhinitis 05/27/2016  . Tobacco abuse counseling 02/11/2016  . Symptomatic anemia   . Lower GI bleed   . Anemia 01/16/2016  . GI bleed 01/10/2016  . GERD (gastroesophageal reflux disease) 01/10/2016  . HTN (hypertension) 01/10/2016  . Depression 01/10/2016  . HIV (human immunodeficiency virus infection) ([place]) 01/10/2016  . Lumbar pseudoarthrosis 10/31/2015  . Chronic cough   . Cough 06/19/2015  . DDD (degenerative disc disease), lumbar 12/31/2014  . Degenerative disc disease, lumbar  12/31/2014  . Asthma, chronic 11/12/2014    Past Surgical History:  Procedure Laterality Date  . BACK SURGERY    . back surgery may 2016     lumbar   . BUNIONECTOMY Bilateral    feet  . DIAGNOSTIC LAPAROSCOPY    . ESOPHAGOGASTRODUODENOSCOPY Left 01/11/2016   Procedure: ESOPHAGOGASTRODUODENOSCOPY (EGD);  Surgeon: Hulen Luster, MD;  Location: [facility] ENDOSCOPY;  Service: Endoscopy;  Laterality: Left;  . Finger fracture Right    5th finger  . HAND SURGERY Bilateral    carpal tunnel  . JOINT REPLACEMENT Bilateral    knees  . KNEE SURGERY Bilateral 2003,2007   total Joint  . LAPAROSCOPY FOR ECTOPIC PREGNANCY    . PERIPHERAL VASCULAR CATHETERIZATION N/A 01/12/2016   Procedure: Visceral Angiography;  Surgeon: Algernon Huxley, MD;  Location: [place] CV [facility];  Service: Cardiovascular;  Laterality: N/A;  . PERIPHERAL VASCULAR CATHETERIZATION N/A 01/12/2016   Procedure: Visceral Artery Intervention;  Surgeon: Algernon Huxley, MD;  Location: [place] [facility];  Service: Cardiovascular;  Laterality: N/A;  . TUBAL LIGATION    . VIDEO BRONCHOSCOPY N/A 06/30/2015   Procedure: VIDEO BRONCHOSCOPY WITHOUT FLUORO;  Surgeon: Vilinda Boehringer, MD;  Location: [facility] ORS;  Service: Cardiopulmonary;  Laterality: N/A;    Prior to Admission medications   Medication Sig Start Date End Date Taking? Authorizing Provider  albuterol (PROVENTIL HFA;VENTOLIN HFA) 108 (90 BASE) MCG/ACT inhaler Inhale 2 puffs into the lungs every  6 (six) hours as needed for wheezing or shortness of breath.     Historical Provider, MD  amLODipine (NORVASC) 10 MG tablet Take 10 mg by mouth daily.     Historical Provider, MD  amoxicillin (AMOXIL) 500 MG tablet Take 1 tablet (500 mg total) by mouth 3 (three) times daily. 08/28/16   Victorino Dike, FNP  dicyclomine (BENTYL) 10 MG capsule Take 1 capsule (10 mg total) by mouth 3 (three) times daily as needed (Abdominal cramping). 01/18/16   Max Sane, MD  efavirenz-emtricitabine-tenofovir (ATRIPLA)  [phone] MG per tablet Take 1 tablet by mouth at bedtime.    Historical Provider, MD  Fluticasone-Salmeterol (ADVAIR DISKUS) 500-50 MCG/DOSE AEPB Inhale 1 puff into the lungs 2 (two) times daily. 05/27/16   Vishal Mungal, MD  Fluticasone-Salmeterol (ADVAIR) 250-50 MCG/DOSE AEPB Inhale 1 puff into the lungs 2 (two) times daily.    Historical Provider, MD  guaiFENesin-codeine 100-10 MG/5ML syrup Take 10 mLs by mouth 3 (three) times daily as needed. 08/28/16   Victorino Dike, FNP  hydrochlorothiazide (HYDRODIURIL) 25 MG tablet Take 25 mg by mouth daily.    Historical Provider, MD  ipratropium-albuterol (DUONEB) 0.5-2.5 (3) MG/3ML SOLN Inhale 3 mLs into the lungs every 6 (six) hours as needed (for wheezing/shortness of breath).     Historical Provider, MD  losartan (COZAAR) 50 MG tablet Take 50 mg by mouth daily.    Historical Provider, MD  mupirocin ointment (BACTROBAN) 2 % Apply 1 application topically 2 (two) times daily as needed (for rash).     Historical Provider, MD  omeprazole (PRILOSEC) 40 MG capsule Take 40 mg by mouth 2 (two) times daily as needed (for acid reflux).    Historical Provider, MD  oxyCODONE (ROXICODONE) 15 MG immediate release tablet Take 15 mg by mouth every 6 (six) hours as needed for pain.    Historical Provider, MD  predniSONE (STERAPRED UNI-PAK 21 TAB) 10 MG (21) TBPK tablet Take 6 tablets on day 1 Take 5 tablets on day 2 Take 4 tablets on day 3 Take 3 tablets on day 4 Take 2 tablets on day 5 Take 1 tablet on day 6 08/28/16   Tarae Wooden B Adarius Tigges, FNP  promethazine (PHENERGAN) 25 MG tablet Take 25 mg by mouth every 6 (six) hours as needed for nausea or vomiting.     Historical Provider, MD  valACYclovir (VALTREX) 500 MG tablet Take 500 mg by mouth 2 (two) times daily.    Historical Provider, MD    Allergies Morphine and related; Gabapentin; and Zanaflex  [tizanidine]  Family History  Problem Relation Age of Onset  . Breast cancer Sister     Social History Social  History  Substance Use Topics  . Smoking status: Light Tobacco Smoker    Years: 38.00    Types: Cigarettes  . Smokeless tobacco: Never Used     Comment: 1 cig daily  . Alcohol use 3.0 oz/week    5 Glasses of wine per week     Comment: wine - couple days of week    Review of Systems Constitutional: Negative for fever. Eyes: No visual changes. ENT: Negative for sore throat; negative for difficulty swallowing. Positive for foreign body sensation Respiratory: Denies shortness of breath. Gastrointestinal: No abdominal pain.  No nausea, no vomiting.  No diarrhea.  Genitourinary: Negative for dysuria. Musculoskeletal: Negative for generalized body aches. Skin: Negative for rash. Neurological: Negative for headaches, focal weakness or numbness.  ____________________________________________   PHYSICAL EXAM:  VITAL SIGNS: ED  Triage Vitals [08/28/16 1425]  Enc Vitals Group     BP 129/75     Pulse Rate 83     Resp 20     Temp 98.2 F (36.8 C)     Temp Source Oral     SpO2 99 %     Weight 278 lb (126.1 kg)     Height 5\' 6"  (1.676 m)     Head Circumference      Peak Flow      Pain Score 9     Pain Loc      Pain Edu?      Excl. in [place]?    Constitutional: Alert and oriented. Well appearing and in no acute distress. Eyes: Conjunctivae are normal. PERRL. EOMI. Head: Atraumatic. Nose: No congestion/rhinnorhea. Mouth/Throat: Mucous membranes are moist.  Oropharynx normal, without exudate. Neck: No stridor.  Lymphatic: Lymphadenopathy: No anterior cervical lymphadenopathy noted Cardiovascular: Normal rate, regular rhythm. Good peripheral circulation. Respiratory: Normal respiratory effort. Lungs CTAB. Gastrointestinal: Soft and nontender. Musculoskeletal: No lower extremity tenderness nor edema.   Neurologic:  Normal speech and language. No gross focal neurologic deficits are appreciated. Speech is normal. No gait instability. Skin:  Skin is warm, dry and intact. No rash  noted Psychiatric: Mood and affect are normal. Speech and behavior are normal.  ____________________________________________   LABS (all labs ordered are listed, but only abnormal results are displayed)  Labs Reviewed - No data to display ____________________________________________  EKG  Not indicated ____________________________________________  RADIOLOGY  Not indicated ____________________________________________   PROCEDURES  Procedure(s) performed: None  Critical Care performed: No  ____________________________________________   INITIAL IMPRESSION / ASSESSMENT AND PLAN / ED COURSE  Clinical Course     Pertinent labs & imaging results that were available during my care of the patient were reviewed by me and considered in my medical decision making (see chart for details).  55 year old female presenting to the emergency department for evaluation of foreign body sensation in throat that makes her feel as though she cannot breathe, especially at night. After clearing her throat forcefully, she reports that the sensation resolves. Tonight she'll be treated with amoxicillin, guaifenesin with codeine, and prednisone taper pack. She was encouraged to follow up with her primary care provider or the ear nose and throat specialist for symptoms that are not improving over the next few days. She was instructed to return to the emergency department for symptoms that change or worsen if she is unable to schedule an appointment. ____________________________________________   FINAL CLINICAL IMPRESSION(S) / ED DIAGNOSES  Final diagnoses:  Globus hystericus  Acute pharyngitis, unspecified etiology    Note:  This document was prepared using Dragon voice recognition software and may include unintentional dictation errors.    Victorino Dike, FNP 08/28/16 Manning Quigley, MD 08/28/16 [phone]

## 2016-08-28 NOTE — ED Triage Notes (Signed)
Pt states that for the past 3 weeks she has been having the sensation that her throat is closing, pt states she is having to jump up at times to catch her breath because she feels like something is in her throat, pt states that she feels like that is congestion in her throat that she is having to cough up. Pt's voice is hoarse, pt is able to speak in complete sentences without difficulty

## 2016-08-28 NOTE — Discharge Instructions (Signed)
Follow-up with her primary care provider in the ear nose and throat specialist for symptoms that are not improving over the next few days. The medications as prescribed until finished. Return to the emergency room for symptoms that change or worsen if you are unable to schedule an appointment.

## 2016-08-28 NOTE — ED Notes (Signed)
FIRST NURSE NOTE: Cough and chest congestion for the past 2-3 weeks.  Pt also c/o dry throat and productive cough.

## 2016-08-28 NOTE — ED Notes (Signed)
Pt able to drink fluids.

## 2016-08-28 NOTE — ED Notes (Signed)
Pt alert and oriented X4, active, cooperative, pt in NAD. RR even and unlabored, color WNL.  Pt informed to return if any life threatening symptoms occur.   

## 2016-10-19 ENCOUNTER — Emergency Department
Admission: EM | Admit: 2016-10-19 | Discharge: 2016-10-19 | Disposition: A | Discharge status: Home / Self Care | Payer: Medicare Other | Attending: Emergency Medicine | Admitting: Emergency Medicine

## 2016-10-19 ENCOUNTER — Encounter: Payer: Self-pay | Admitting: Medical Oncology

## 2016-10-19 DIAGNOSIS — M546 Pain in thoracic spine: Secondary | ICD-10-CM | POA: Insufficient documentation

## 2016-10-19 DIAGNOSIS — G8929 Other chronic pain: Secondary | ICD-10-CM

## 2016-10-19 DIAGNOSIS — F17210 Nicotine dependence, cigarettes, uncomplicated: Secondary | ICD-10-CM | POA: Insufficient documentation

## 2016-10-19 DIAGNOSIS — Z21 Asymptomatic human immunodeficiency virus [HIV] infection status: Secondary | ICD-10-CM | POA: Diagnosis not present

## 2016-10-19 DIAGNOSIS — I10 Essential (primary) hypertension: Secondary | ICD-10-CM | POA: Diagnosis not present

## 2016-10-19 DIAGNOSIS — J45909 Unspecified asthma, uncomplicated: Secondary | ICD-10-CM | POA: Insufficient documentation

## 2016-10-19 MED ORDER — KETOROLAC TROMETHAMINE 60 MG/2ML IM SOLN
60.0000 mg | Freq: Once | INTRAMUSCULAR | Status: AC
  Administered 2016-10-19: 60 mg via INTRAMUSCULAR
  Filled 2016-10-19: qty 2

## 2016-10-19 MED ORDER — ORPHENADRINE CITRATE 30 MG/ML IJ SOLN
60.0000 mg | Freq: Two times a day (BID) | INTRAMUSCULAR | Status: DC
  Administered 2016-10-19: 60 mg via INTRAMUSCULAR
  Filled 2016-10-19: qty 2

## 2016-10-19 NOTE — ED Notes (Signed)
Pt standing at doorway, asking to leave. Pt given discharge paperwork. Pt alert and oriented X4, active, cooperative, pt in NAD. RR even and unlabored, color WNL.

## 2016-10-19 NOTE — ED Triage Notes (Signed)
Pt reports left lower back pain into buttock for several weeks.

## 2016-10-19 NOTE — ED Provider Notes (Signed)
[facility] Emergency Department Provider Note   ____________________________________________   None    (approximate)  I have reviewed the triage vital signs and the nursing notes.   HISTORY  Chief Complaint Back Pain   HPI Melanie Bowen is a 56 y.o. female who presents today with a few weeks or left sided lower back pain. She noted that the pain has progressively worsened over the last few weeks, and she decided to present today because the pain was getting unbearable. She denied any trauma or injury prior to the onset of the pain. Walking exacerbates the pain. She has tried acetaminophen, ice, and heating pads without any relief of her pain. She denied any bladder incontinence, bowel incontinence, or saddle anesthesia. She does have a history of 2 previous back surgeries.    Past Medical History:  Diagnosis Date  . Allergic rhinitis   . Arthritis    DDD  . Asthma   . Blepharitis   . Bronchitis   . Cigarette smoker   . Claustrophobia   . COPD (chronic obstructive pulmonary disease) ([place])   . Cough   . Depression   . Eye irritation   . Genital herpes   . GERD (gastroesophageal reflux disease)   . Headache   . HIV (human immunodeficiency virus infection) ([place])   . Hypertension   . Paresthesia of hand, bilateral   . Shortness of breath dyspnea    at times due to asthma    Patient Active Problem List   Diagnosis Date Noted  . Allergic rhinitis 05/27/2016  . Tobacco abuse counseling 02/11/2016  . Symptomatic anemia   . Lower GI bleed   . Anemia 01/16/2016  . GI bleed 01/10/2016  . GERD (gastroesophageal reflux disease) 01/10/2016  . HTN (hypertension) 01/10/2016  . Depression 01/10/2016  . HIV (human immunodeficiency virus infection) ([place]) 01/10/2016  . Lumbar pseudoarthrosis 10/31/2015  . Chronic cough   . Cough 06/19/2015  . DDD (degenerative disc disease), lumbar 12/31/2014  . Degenerative disc disease, lumbar 12/31/2014  .  Asthma, chronic 11/12/2014    Past Surgical History:  Procedure Laterality Date  . BACK SURGERY    . back surgery may 2016     lumbar   . BUNIONECTOMY Bilateral    feet  . DIAGNOSTIC LAPAROSCOPY    . ESOPHAGOGASTRODUODENOSCOPY Left 01/11/2016   Procedure: ESOPHAGOGASTRODUODENOSCOPY (EGD);  Surgeon: Hulen Luster, MD;  Location: [facility] ENDOSCOPY;  Service: Endoscopy;  Laterality: Left;  . Finger fracture Right    5th finger  . HAND SURGERY Bilateral    carpal tunnel  . JOINT REPLACEMENT Bilateral    knees  . KNEE SURGERY Bilateral 2003,2007   total Joint  . LAPAROSCOPY FOR ECTOPIC PREGNANCY    . PERIPHERAL VASCULAR CATHETERIZATION N/A 01/12/2016   Procedure: Visceral Angiography;  Surgeon: Algernon Huxley, MD;  Location: [place] [facility];  Service: Cardiovascular;  Laterality: N/A;  . PERIPHERAL VASCULAR CATHETERIZATION N/A 01/12/2016   Procedure: Visceral Artery Intervention;  Surgeon: Algernon Huxley, MD;  Location: [place] [facility] LAB;  Service: Cardiovascular;  Laterality: N/A;  . TUBAL LIGATION    . VIDEO BRONCHOSCOPY N/A 06/30/2015   Procedure: VIDEO BRONCHOSCOPY WITHOUT FLUORO;  Surgeon: Vilinda Boehringer, MD;  Location: [facility] ORS;  Service: Cardiopulmonary;  Laterality: N/A;    Prior to Admission medications   Medication Sig Start Date End Date Taking? Authorizing Provider  albuterol (PROVENTIL HFA;VENTOLIN HFA) 108 (90 BASE) MCG/ACT inhaler Inhale 2 puffs into the lungs  every 6 (six) hours as needed for wheezing or shortness of breath.     Historical Provider, MD  amLODipine (NORVASC) 10 MG tablet Take 10 mg by mouth daily.     Historical Provider, MD  amoxicillin (AMOXIL) 500 MG tablet Take 1 tablet (500 mg total) by mouth 3 (three) times daily. 08/28/16   Victorino Dike, FNP  dicyclomine (BENTYL) 10 MG capsule Take 1 capsule (10 mg total) by mouth 3 (three) times daily as needed (Abdominal cramping). 01/18/16   Max Sane, MD  efavirenz-emtricitabine-tenofovir (ATRIPLA) [phone] MG  per tablet Take 1 tablet by mouth at bedtime.    Historical Provider, MD  Fluticasone-Salmeterol (ADVAIR DISKUS) 500-50 MCG/DOSE AEPB Inhale 1 puff into the lungs 2 (two) times daily. 05/27/16   Vishal Mungal, MD  Fluticasone-Salmeterol (ADVAIR) 250-50 MCG/DOSE AEPB Inhale 1 puff into the lungs 2 (two) times daily.    Historical Provider, MD  guaiFENesin-codeine 100-10 MG/5ML syrup Take 10 mLs by mouth 3 (three) times daily as needed. 08/28/16   Victorino Dike, FNP  hydrochlorothiazide (HYDRODIURIL) 25 MG tablet Take 25 mg by mouth daily.    Historical Provider, MD  ipratropium-albuterol (DUONEB) 0.5-2.5 (3) MG/3ML SOLN Inhale 3 mLs into the lungs every 6 (six) hours as needed (for wheezing/shortness of breath).     Historical Provider, MD  losartan (COZAAR) 50 MG tablet Take 50 mg by mouth daily.    Historical Provider, MD  mupirocin ointment (BACTROBAN) 2 % Apply 1 application topically 2 (two) times daily as needed (for rash).     Historical Provider, MD  omeprazole (PRILOSEC) 40 MG capsule Take 40 mg by mouth 2 (two) times daily as needed (for acid reflux).    Historical Provider, MD  oxyCODONE (ROXICODONE) 15 MG immediate release tablet Take 15 mg by mouth every 6 (six) hours as needed for pain.    Historical Provider, MD  predniSONE (STERAPRED UNI-PAK 21 TAB) 10 MG (21) TBPK tablet Take 6 tablets on day 1 Take 5 tablets on day 2 Take 4 tablets on day 3 Take 3 tablets on day 4 Take 2 tablets on day 5 Take 1 tablet on day 6 08/28/16   Cari B Triplett, FNP  promethazine (PHENERGAN) 25 MG tablet Take 25 mg by mouth every 6 (six) hours as needed for nausea or vomiting.     Historical Provider, MD  valACYclovir (VALTREX) 500 MG tablet Take 500 mg by mouth 2 (two) times daily.    Historical Provider, MD    Allergies Morphine and related; Gabapentin; and Zanaflex  [tizanidine]  Family History  Problem Relation Age of Onset  . Breast cancer Sister     Social History Social History    Substance Use Topics  . Smoking status: Light Tobacco Smoker    Years: 38.00    Types: Cigarettes  . Smokeless tobacco: Never Used     Comment: 1 cig daily  . Alcohol use 3.0 oz/week    5 Glasses of wine per week     Comment: wine - couple days of week    Review of Systems Constitutional: No fever/chills Eyes: No visual changes. ENT: No sore throat. Cardiovascular: Denies chest pain. Respiratory: Denies shortness of breath. Gastrointestinal: No abdominal pain.  No nausea, no vomiting.  No diarrhea.  No constipation. Negative for bowel incontinence Genitourinary: Negative for dysuria, negative for bladder Incontinence  Musculoskeletal: Positive for back pain. Skin: Negative for rash. Neurological: Negative for headaches, focal weakness or numbness. Negative for saddle anesthesia  ____________________________________________   PHYSICAL EXAM:  VITAL SIGNS: ED Triage Vitals  Enc Vitals Group     BP 10/19/16 1341 (!) 151/61     Pulse Rate 10/19/16 1341 88     Resp 10/19/16 1341 16     Temp --      Temp src --      SpO2 10/19/16 1341 98 %     Weight 10/19/16 1342 280 lb (127 kg)     Height 10/19/16 1342 5\' 6"  (1.676 m)     Head Circumference --      Peak Flow --      Pain Score 10/19/16 1342 9     Pain Loc --      Pain Edu? --      Excl. in [place]? --     Constitutional: Alert and oriented. Well appearing and in no acute distress. Eyes: Conjunctivae are normal. PERRL. EOMI. Head: Atraumatic. Nose: No congestion/rhinnorhea. Mouth/Throat: Mucous membranes are moist.  Oropharynx non-erythematous. Neck: No stridor Cardiovascular: Normal rate, regular rhythm. Grossly normal heart sounds.  Good peripheral circulation. Respiratory: Normal respiratory effort.  No retractions. Lungs CTAB. Gastrointestinal: Soft and nontender. No distention/ Obese Musculoskeletal: No midline L-Spine tenderness, step-offs, or deformities. Left sided paraspinal tenderness. Tenderness over the  left PSIS joint. FROM in her lumbar spine. Muscle strength 5/5 in bilateral lower extremities.  Neurologic:  Normal speech and language. No gross focal neurologic deficits are appreciated. No gait instability. Skin:  Skin is warm, dry and intact. No rash noted. Midline vertical scar over her lumbar spine, well healed Psychiatric: Mood and affect are normal. Speech and behavior are normal.  ____________________________________________   LABS (all labs ordered are listed, but only abnormal results are displayed)  Labs Reviewed - No data to display ____________________________________________  EKG   ____________________________________________  RADIOLOGY   ____________________________________________   PROCEDURES  Procedure(s) performed: None  Procedures  Critical Care performed: No  ____________________________________________   INITIAL IMPRESSION / ASSESSMENT AND PLAN / ED COURSE  Pertinent labs & imaging results that were available during my care of the patient were reviewed by me and considered in my medical decision making (see chart for details).  Chronic back pain. Patient given discharge Instructions explained the patient that for chronic prescription narcotic medication will be prescribed for her. Patient given Norflex and Toradol and advised follow-up with her neurosurgeon since the pain medication is not providing relief.      ____________________________________________   FINAL CLINICAL IMPRESSION(S) / ED DIAGNOSES  Final diagnoses:  Chronic left-sided thoracic back pain      NEW MEDICATIONS STARTED DURING THIS VISIT:  New Prescriptions   No medications on file     Note:  This document was prepared using Dragon voice recognition software and may include unintentional dictation errors.    Sable Feil, PA-C 10/19/16 [place], MD 10/19/16 [phone]

## 2016-10-19 NOTE — ED Notes (Signed)
Left sided lower back/flank pain X "weeks". Gradually worsening. Pt ambulatory back to room. Pt alert and oriented X4, active, cooperative, pt in NAD. RR even and unlabored, color WNL.

## 2016-10-27 ENCOUNTER — Ambulatory Visit: Payer: Medicare Other | Attending: Otolaryngology

## 2016-11-17 ENCOUNTER — Ambulatory Visit: Payer: Medicare Other | Attending: Otolaryngology

## 2016-11-17 DIAGNOSIS — Z6841 Body Mass Index (BMI) 40.0 and over, adult: Secondary | ICD-10-CM | POA: Diagnosis not present

## 2016-11-17 DIAGNOSIS — G4733 Obstructive sleep apnea (adult) (pediatric): Secondary | ICD-10-CM | POA: Insufficient documentation

## 2016-11-17 DIAGNOSIS — F5101 Primary insomnia: Secondary | ICD-10-CM | POA: Insufficient documentation

## 2016-11-17 DIAGNOSIS — E669 Obesity, unspecified: Secondary | ICD-10-CM | POA: Diagnosis not present

## 2016-11-28 ENCOUNTER — Inpatient Hospital Stay
Admission: EM | Admit: 2016-11-28 | Discharge: 2016-12-01 | DRG: 177 | Disposition: A | Discharge status: Home / Self Care | Payer: Medicare Other | Attending: Internal Medicine | Admitting: Internal Medicine

## 2016-11-28 ENCOUNTER — Emergency Department: Payer: Medicare Other

## 2016-11-28 ENCOUNTER — Encounter: Payer: Self-pay | Admitting: Emergency Medicine

## 2016-11-28 DIAGNOSIS — E871 Hypo-osmolality and hyponatremia: Secondary | ICD-10-CM | POA: Diagnosis present

## 2016-11-28 DIAGNOSIS — F17200 Nicotine dependence, unspecified, uncomplicated: Secondary | ICD-10-CM | POA: Diagnosis not present

## 2016-11-28 DIAGNOSIS — J309 Allergic rhinitis, unspecified: Secondary | ICD-10-CM | POA: Diagnosis present

## 2016-11-28 DIAGNOSIS — J181 Lobar pneumonia, unspecified organism: Secondary | ICD-10-CM

## 2016-11-28 DIAGNOSIS — J4520 Mild intermittent asthma, uncomplicated: Secondary | ICD-10-CM | POA: Diagnosis present

## 2016-11-28 DIAGNOSIS — F17210 Nicotine dependence, cigarettes, uncomplicated: Secondary | ICD-10-CM | POA: Diagnosis present

## 2016-11-28 DIAGNOSIS — N179 Acute kidney failure, unspecified: Secondary | ICD-10-CM | POA: Diagnosis present

## 2016-11-28 DIAGNOSIS — R05 Cough: Secondary | ICD-10-CM | POA: Diagnosis not present

## 2016-11-28 DIAGNOSIS — E669 Obesity, unspecified: Secondary | ICD-10-CM | POA: Diagnosis present

## 2016-11-28 DIAGNOSIS — F329 Major depressive disorder, single episode, unspecified: Secondary | ICD-10-CM | POA: Diagnosis present

## 2016-11-28 DIAGNOSIS — Z79899 Other long term (current) drug therapy: Secondary | ICD-10-CM | POA: Diagnosis not present

## 2016-11-28 DIAGNOSIS — I10 Essential (primary) hypertension: Secondary | ICD-10-CM | POA: Diagnosis present

## 2016-11-28 DIAGNOSIS — Z7951 Long term (current) use of inhaled steroids: Secondary | ICD-10-CM | POA: Diagnosis not present

## 2016-11-28 DIAGNOSIS — Z96653 Presence of artificial knee joint, bilateral: Secondary | ICD-10-CM | POA: Diagnosis present

## 2016-11-28 DIAGNOSIS — J440 Chronic obstructive pulmonary disease with acute lower respiratory infection: Secondary | ICD-10-CM | POA: Diagnosis present

## 2016-11-28 DIAGNOSIS — Z6841 Body Mass Index (BMI) 40.0 and over, adult: Secondary | ICD-10-CM

## 2016-11-28 DIAGNOSIS — K219 Gastro-esophageal reflux disease without esophagitis: Secondary | ICD-10-CM | POA: Diagnosis present

## 2016-11-28 DIAGNOSIS — Z888 Allergy status to other drugs, medicaments and biological substances status: Secondary | ICD-10-CM | POA: Diagnosis not present

## 2016-11-28 DIAGNOSIS — J9601 Acute respiratory failure with hypoxia: Secondary | ICD-10-CM | POA: Diagnosis present

## 2016-11-28 DIAGNOSIS — Z21 Asymptomatic human immunodeficiency virus [HIV] infection status: Secondary | ICD-10-CM | POA: Diagnosis present

## 2016-11-28 DIAGNOSIS — D638 Anemia in other chronic diseases classified elsewhere: Secondary | ICD-10-CM | POA: Diagnosis present

## 2016-11-28 DIAGNOSIS — J15212 Pneumonia due to Methicillin resistant Staphylococcus aureus: Secondary | ICD-10-CM | POA: Diagnosis present

## 2016-11-28 DIAGNOSIS — J189 Pneumonia, unspecified organism: Secondary | ICD-10-CM

## 2016-11-28 DIAGNOSIS — J984 Other disorders of lung: Secondary | ICD-10-CM | POA: Diagnosis not present

## 2016-11-28 DIAGNOSIS — Z885 Allergy status to narcotic agent status: Secondary | ICD-10-CM | POA: Diagnosis not present

## 2016-11-28 DIAGNOSIS — B20 Human immunodeficiency virus [HIV] disease: Secondary | ICD-10-CM

## 2016-11-28 DIAGNOSIS — E876 Hypokalemia: Secondary | ICD-10-CM | POA: Diagnosis present

## 2016-11-28 LAB — COMPREHENSIVE METABOLIC PANEL
ALT: 13 U/L — ABNORMAL LOW (ref 14–54)
AST: 24 U/L (ref 15–41)
Albumin: 2.6 g/dL — ABNORMAL LOW (ref 3.5–5.0)
Alkaline Phosphatase: 59 U/L (ref 38–126)
Anion gap: 9 (ref 5–15)
BUN: 22 mg/dL — ABNORMAL HIGH (ref 6–20)
CO2: 21 mmol/L — ABNORMAL LOW (ref 22–32)
Calcium: 7.6 mg/dL — ABNORMAL LOW (ref 8.9–10.3)
Chloride: 103 mmol/L (ref 101–111)
Creatinine, Ser: 1.36 mg/dL — ABNORMAL HIGH (ref 0.44–1.00)
GFR calc Af Amer: 50 mL/min — ABNORMAL LOW (ref >60)
GFR calc non Af Amer: 43 mL/min — ABNORMAL LOW (ref >60)
Glucose, Bld: 108 mg/dL — ABNORMAL HIGH (ref 65–99)
Potassium: 3.2 mmol/L — ABNORMAL LOW (ref 3.5–5.1)
Sodium: 133 mmol/L — ABNORMAL LOW (ref 135–145)
Total Bilirubin: 0.9 mg/dL (ref 0.3–1.2)
Total Protein: 6 g/dL — ABNORMAL LOW (ref 6.5–8.1)

## 2016-11-28 LAB — CBC
HCT: 29.6 % — ABNORMAL LOW (ref 35.0–47.0)
Hemoglobin: 9.6 g/dL — ABNORMAL LOW (ref 12.0–16.0)
MCH: 25.8 pg — ABNORMAL LOW (ref 26.0–34.0)
MCHC: 32.4 g/dL (ref 32.0–36.0)
MCV: 79.6 fL — ABNORMAL LOW (ref 80.0–100.0)
Platelets: 365 10*3/uL (ref 150–440)
RBC: 3.72 MIL/uL — ABNORMAL LOW (ref 3.80–5.20)
RDW: 17.5 % — ABNORMAL HIGH (ref 11.5–14.5)
WBC: 21.6 10*3/uL — ABNORMAL HIGH (ref 3.6–11.0)

## 2016-11-28 LAB — INFLUENZA PANEL BY PCR (TYPE A & B)
Influenza A By PCR: NEGATIVE
Influenza B By PCR: NEGATIVE

## 2016-11-28 MED ORDER — LEVOFLOXACIN IN D5W 750 MG/150ML IV SOLN
750.0000 mg | INTRAVENOUS | Status: DC
  Administered 2016-11-29: 750 mg via INTRAVENOUS
  Filled 2016-11-28: qty 150

## 2016-11-28 MED ORDER — LEVOFLOXACIN IN D5W 500 MG/100ML IV SOLN
500.0000 mg | Freq: Once | INTRAVENOUS | Status: AC
  Administered 2016-11-28: 500 mg via INTRAVENOUS
  Filled 2016-11-28: qty 100

## 2016-11-28 MED ORDER — HYDROCHLOROTHIAZIDE 25 MG PO TABS
25.0000 mg | ORAL_TABLET | Freq: Every day | ORAL | Status: DC
Start: 2016-11-28 — End: 2016-11-28

## 2016-11-28 MED ORDER — IBUPROFEN 600 MG PO TABS
600.0000 mg | ORAL_TABLET | Freq: Once | ORAL | Status: AC
  Administered 2016-11-28: 600 mg via ORAL
  Filled 2016-11-28: qty 1

## 2016-11-28 MED ORDER — PHENOL 1.4 % MT LIQD
1.0000 | OROMUCOSAL | Status: DC | PRN
  Administered 2016-11-28: 1 via OROMUCOSAL
  Filled 2016-11-28: qty 177

## 2016-11-28 MED ORDER — ELVITEG-COBIC-EMTRICIT-TENOFAF 150-150-200-10 MG PO TABS
1.0000 | ORAL_TABLET | Freq: Every day | ORAL | Status: DC
  Administered 2016-11-28 – 2016-11-30 (×3): 1 via ORAL
  Filled 2016-11-28 (×3): qty 1

## 2016-11-28 MED ORDER — NICOTINE 21 MG/24HR TD PT24
21.0000 mg | MEDICATED_PATCH | Freq: Every day | TRANSDERMAL | Status: DC
  Administered 2016-11-28 – 2016-12-01 (×4): 21 mg via TRANSDERMAL
  Filled 2016-11-28 (×4): qty 1

## 2016-11-28 MED ORDER — IPRATROPIUM-ALBUTEROL 0.5-2.5 (3) MG/3ML IN SOLN
3.0000 mL | Freq: Once | RESPIRATORY_TRACT | Status: AC
  Administered 2016-11-28: 3 mL via RESPIRATORY_TRACT
  Filled 2016-11-28: qty 3

## 2016-11-28 MED ORDER — SODIUM CHLORIDE 0.9 % IV BOLUS (SEPSIS)
1000.0000 mL | Freq: Once | INTRAVENOUS | Status: AC
  Administered 2016-11-28: 1000 mL via INTRAVENOUS

## 2016-11-28 MED ORDER — EFAVIRENZ-EMTRICITAB-TENOFOVIR 600-200-300 MG PO TABS: 1.0000 | ORAL_TABLET | Freq: Every day | ORAL | Status: DC

## 2016-11-28 MED ORDER — HEPARIN SODIUM (PORCINE) 5000 UNIT/ML IJ SOLN
5000.0000 [IU] | Freq: Three times a day (TID) | INTRAMUSCULAR | Status: DC
  Administered 2016-11-28 – 2016-12-01 (×9): 5000 [IU] via SUBCUTANEOUS
  Filled 2016-11-28 (×9): qty 1

## 2016-11-28 MED ORDER — PROMETHAZINE HCL 25 MG PO TABS: 25.0000 mg | ORAL_TABLET | Freq: Four times a day (QID) | ORAL | Status: DC | PRN

## 2016-11-28 MED ORDER — AMLODIPINE BESYLATE 5 MG PO TABS: 10.0000 mg | ORAL_TABLET | Freq: Every day | ORAL | Status: DC

## 2016-11-28 MED ORDER — OXYCODONE HCL 5 MG PO TABS
15.0000 mg | ORAL_TABLET | Freq: Four times a day (QID) | ORAL | Status: DC | PRN
  Administered 2016-11-28 – 2016-12-01 (×10): 15 mg via ORAL
  Filled 2016-11-28 (×10): qty 3

## 2016-11-28 MED ORDER — IPRATROPIUM-ALBUTEROL 0.5-2.5 (3) MG/3ML IN SOLN
3.0000 mL | RESPIRATORY_TRACT | Status: DC | PRN
  Administered 2016-11-28 – 2016-12-01 (×4): 3 mL via RESPIRATORY_TRACT
  Filled 2016-11-28 (×4): qty 3

## 2016-11-28 MED ORDER — PANTOPRAZOLE SODIUM 40 MG PO TBEC
40.0000 mg | DELAYED_RELEASE_TABLET | Freq: Every day | ORAL | Status: DC
  Administered 2016-11-29 – 2016-11-30 (×2): 40 mg via ORAL
  Filled 2016-11-28 (×3): qty 1

## 2016-11-28 MED ORDER — DOCUSATE SODIUM 100 MG PO CAPS: 100.0000 mg | ORAL_CAPSULE | Freq: Two times a day (BID) | ORAL | Status: DC | PRN

## 2016-11-28 MED ORDER — GUAIFENESIN-CODEINE 100-10 MG/5ML PO SOLN
10.0000 mL | Freq: Four times a day (QID) | ORAL | Status: DC | PRN
  Administered 2016-11-28 – 2016-12-01 (×5): 10 mL via ORAL
  Filled 2016-11-28 (×5): qty 10

## 2016-11-28 MED ORDER — IPRATROPIUM-ALBUTEROL 0.5-2.5 (3) MG/3ML IN SOLN: 3.0000 mL | Freq: Four times a day (QID) | RESPIRATORY_TRACT | Status: DC | PRN

## 2016-11-28 MED ORDER — LOSARTAN POTASSIUM 50 MG PO TABS: 50.0000 mg | ORAL_TABLET | Freq: Every day | ORAL | Status: DC

## 2016-11-28 MED ORDER — DICYCLOMINE HCL 10 MG PO CAPS: 10.0000 mg | ORAL_CAPSULE | Freq: Three times a day (TID) | ORAL | Status: DC | PRN

## 2016-11-28 MED ORDER — VALACYCLOVIR HCL 500 MG PO TABS
500.0000 mg | ORAL_TABLET | Freq: Two times a day (BID) | ORAL | Status: DC
  Administered 2016-11-28 – 2016-12-01 (×6): 500 mg via ORAL
  Filled 2016-11-28 (×6): qty 1

## 2016-11-28 NOTE — ED Provider Notes (Signed)
[facility] Emergency Department Provider Note  ____________________________________________  Time seen: Approximately 10:36 AM  I have reviewed the triage vital signs and the nursing notes.   HISTORY  Chief Complaint Cough and Chills    HPI Melanie Bowen is a 56 y.o. female that presents to the emergency department with one day of chills, muscle aches, congestion, sore throat, and productive cough.She is coughing up yellow sputum. She states it hurts throughout her chest. Every time she coughs, she has a "squirt of diarrhea." She is drinking a lot of water because she has a dry mouth. She states that she did 2 breathing treatments this morning that did not help. She is not sure what was in her breathing treatments. She states that she "only smokes when I drink wine and I am trying to quit and I don't think you need to now how much I smoke because it should not affect my treatment." She states that "I have been trying to get rid of this sickness but just can't. Please just hook me up to an IV, that makes everything better." She states she does not have any medical conditions that she knows of. When asked about hypertension, asthma, and HIV, she then agrees that she has those. She takes medication for her HIV and states that she has undetectable levels. She denies fever, nausea, vomiting, abdominal pain.   Past Medical History:  Diagnosis Date  . Allergic rhinitis   . Arthritis    DDD  . Asthma   . Blepharitis   . Bronchitis   . Cigarette smoker   . Claustrophobia   . COPD (chronic obstructive pulmonary disease) ([place])   . Cough   . Depression   . Eye irritation   . Genital herpes   . GERD (gastroesophageal reflux disease)   . Headache   . HIV (human immunodeficiency virus infection) ([place])   . Hypertension   . Paresthesia of hand, bilateral   . Shortness of breath dyspnea    at times due to asthma    Patient Active Problem List   Diagnosis Date  Noted  . Community acquired pneumonia 11/28/2016  . Pneumonia 11/28/2016  . Allergic rhinitis 05/27/2016  . Tobacco abuse counseling 02/11/2016  . Symptomatic anemia   . Lower GI bleed   . Anemia 01/16/2016  . GI bleed 01/10/2016  . GERD (gastroesophageal reflux disease) 01/10/2016  . HTN (hypertension) 01/10/2016  . Depression 01/10/2016  . HIV (human immunodeficiency virus infection) ([place]) 01/10/2016  . Lumbar pseudoarthrosis 10/31/2015  . Chronic cough   . Cough 06/19/2015  . DDD (degenerative disc disease), lumbar 12/31/2014  . Degenerative disc disease, lumbar 12/31/2014  . Asthma, chronic 11/12/2014    Past Surgical History:  Procedure Laterality Date  . BACK SURGERY    . back surgery may 2016     lumbar   . BUNIONECTOMY Bilateral    feet  . DIAGNOSTIC LAPAROSCOPY    . ESOPHAGOGASTRODUODENOSCOPY Left 01/11/2016   Procedure: ESOPHAGOGASTRODUODENOSCOPY (EGD);  Surgeon: Hulen Luster, MD;  Location[facility] ENDOSCOPY;  Service: Endoscopy;  Laterality: Left;  . Finger fracture Right    5th finger  . HAND SURGERY Bilateral    carpal tunnel  . JOINT REPLACEMENT Bilateral    knees  . KNEE SURGERY Bilateral 2003,2007   total Joint  . LAPAROSCOPY FOR ECTOPIC PREGNANCY    . PERIPHERAL VASCULAR CATHETERIZATION N/A 01/12/2016   Procedure: Visceral Angiography;  Surgeon: Algernon Huxley, MD;  Location: [facility]  CV LAB;  Service: Cardiovascular;  Laterality: N/A;  . PERIPHERAL VASCULAR CATHETERIZATION N/A 01/12/2016   Procedure: Visceral Artery Intervention;  Surgeon: Algernon Huxley, MD;  Location: [facility] CV LAB;  Service: Cardiovascular;  Laterality: N/A;  . TUBAL LIGATION    . VIDEO BRONCHOSCOPY N/A 06/30/2015   Procedure: VIDEO BRONCHOSCOPY WITHOUT FLUORO;  Surgeon: Vilinda Boehringer, MD;  Location: [facility] ORS;  Service: Cardiopulmonary;  Laterality: N/A;    Prior to Admission medications   Medication Sig Start Date End Date Taking? Authorizing Provider  albuterol (PROVENTIL  HFA;VENTOLIN HFA) 108 (90 BASE) MCG/ACT inhaler Inhale 2 puffs into the lungs every 6 (six) hours as needed for wheezing or shortness of breath.    Yes Historical Provider, MD  amLODipine (NORVASC) 10 MG tablet Take 10 mg by mouth daily.    Yes Historical Provider, MD  Fluticasone-Salmeterol (ADVAIR DISKUS) 500-50 MCG/DOSE AEPB Inhale 1 puff into the lungs 2 (two) times daily. 05/27/16  Yes Vishal Mungal, MD  GENVOYA 150-150-200-10 MG TABS tablet Take 1 tablet by mouth daily. 11/09/16  Yes Historical Provider, MD  guaiFENesin-codeine 100-10 MG/5ML syrup Take 10 mLs by mouth 3 (three) times daily as needed. 08/28/16  Yes Cari B Triplett, FNP  hydrochlorothiazide (HYDRODIURIL) 25 MG tablet Take 25 mg by mouth daily.   Yes Historical Provider, MD  ipratropium-albuterol (DUONEB) 0.5-2.5 (3) MG/3ML SOLN Inhale 3 mLs into the lungs every 6 (six) hours as needed (for wheezing/shortness of breath).    Yes Historical Provider, MD  losartan (COZAAR) 50 MG tablet Take 50 mg by mouth daily.   Yes Historical Provider, MD  mupirocin ointment (BACTROBAN) 2 % Apply 1 application topically 2 (two) times daily as needed (for rash).    Yes Historical Provider, MD  omeprazole (PRILOSEC) 40 MG capsule Take 40 mg by mouth daily.    Yes Historical Provider, MD  oxyCODONE (ROXICODONE) 15 MG immediate release tablet Take 15 mg by mouth every 6 (six) hours as needed for pain.   Yes Historical Provider, MD  promethazine (PHENERGAN) 25 MG tablet Take 25 mg by mouth every 6 (six) hours as needed for nausea or vomiting.    Yes Historical Provider, MD  valACYclovir (VALTREX) 500 MG tablet Take 500 mg by mouth 2 (two) times daily.   Yes Historical Provider, MD  amoxicillin (AMOXIL) 500 MG tablet Take 1 tablet (500 mg total) by mouth 3 (three) times daily. Patient not taking: Reported on 11/28/2016 08/28/16   Victorino Dike, FNP  dicyclomine (BENTYL) 10 MG capsule Take 1 capsule (10 mg total) by mouth 3 (three) times daily as needed  (Abdominal cramping). Patient not taking: Reported on 11/28/2016 01/18/16   Max Sane, MD  efavirenz-emtricitabine-tenofovir (ATRIPLA) [phone] MG per tablet Take 1 tablet by mouth at bedtime.    Historical Provider, MD  Fluticasone-Salmeterol (ADVAIR) 250-50 MCG/DOSE AEPB Inhale 1 puff into the lungs 2 (two) times daily.    Historical Provider, MD  predniSONE (STERAPRED UNI-PAK 21 TAB) 10 MG (21) TBPK tablet Take 6 tablets on day 1 Take 5 tablets on day 2 Take 4 tablets on day 3 Take 3 tablets on day 4 Take 2 tablets on day 5 Take 1 tablet on day 6 Patient not taking: Reported on 11/28/2016 08/28/16   Victorino Dike, FNP  valACYclovir (VALTREX) 500 MG tablet Take 500 mg by mouth 2 (two) times daily.    Historical Provider, MD    Allergies Morphine and related; Gabapentin; and Zanaflex  [tizanidine]  Family History  Problem Relation Age of Onset  . Breast cancer Sister     Social History Social History  Substance Use Topics  . Smoking status: Light Tobacco Smoker    Years: 38.00    Types: Cigarettes  . Smokeless tobacco: Never Used     Comment: 1 cig daily  . Alcohol use 3.0 oz/week    5 Glasses of wine per week     Comment: wine - couple days of week     Review of Systems  Constitutional: Positive for fever/chills Eyes: No visual changes. No discharge. ENT: Positive for congestion and rhinorrhea. Respiratory: Positive for cough. No SOB. Gastrointestinal: No abdominal pain.  No nausea, no vomiting. Positive for diarrhea. No constipation. Musculoskeletal: Negative for musculoskeletal pain. Skin: Negative for rash, abrasions, lacerations, ecchymosis. Neurological: Negative for headaches.   ____________________________________________   PHYSICAL EXAM:  VITAL SIGNS: ED Triage Vitals  Enc Vitals Group     BP 11/28/16 1003 (!) 99/48     Pulse Rate 11/28/16 1003 (!) 113     Resp 11/28/16 1003 (!) 22     Temp 11/28/16 1003 99.5 F (37.5 C)     Temp Source 11/28/16  1003 Oral     SpO2 11/28/16 1003 95 %     Weight 11/28/16 1002 280 lb (127 kg)     Height 11/28/16 1002 5\' 6"  (1.676 m)     Head Circumference --      Peak Flow --      Pain Score 11/28/16 1002 10     Pain Loc --      Pain Edu? --      Excl. in [place]? --      Constitutional: Alert and oriented. Eyes: Conjunctivae are normal. PERRL. EOMI. No discharge. Head: Atraumatic. ENT: No frontal and maxillary sinus tenderness.      Ears: Tympanic membranes pearly gray with good landmarks. No discharge.      Nose: Mild congestion/rhinnorhea.      Mouth/Throat: Mucous membranes are moist. Oropharynx non-erythematous. Tonsils not enlarged. No exudates. Uvula midline. Neck: No stridor.   Hematological/Lymphatic/Immunilogical: No cervical lymphadenopathy. Cardiovascular: Tachycardic rate, regular rhythm.  Good peripheral circulation. Respiratory: Tachypnea without retractions. Crackles in lower right lower. Good air entry to the bases with no decreased or absent breath sounds. Gastrointestinal: Bowel sounds 4 quadrants. Soft and nontender to palpation. No guarding or rigidity. No palpable masses. No distention. Musculoskeletal: Full range of motion to all extremities. No gross deformities appreciated. Neurologic:  Normal speech and language. No gross focal neurologic deficits are appreciated.  Skin:  Skin is warm, dry and intact. No rash noted.    ____________________________________________   LABS (all labs ordered are listed, but only abnormal results are displayed)  Labs Reviewed  CBC - Abnormal; Notable for the following:       Result Value   WBC 21.6 (*)    RBC 3.72 (*)    Hemoglobin 9.6 (*)    HCT 29.6 (*)    MCV 79.6 (*)    MCH 25.8 (*)    RDW 17.5 (*)    All other components within normal limits  COMPREHENSIVE METABOLIC PANEL - Abnormal; Notable for the following:    Sodium 133 (*)    Potassium 3.2 (*)    CO2 21 (*)    Glucose, Bld 108 (*)    BUN 22 (*)    Creatinine, Ser  1.36 (*)    Calcium 7.6 (*)    Total Protein 6.0 (*)  Albumin 2.6 (*)    ALT 13 (*)    GFR calc non Af Amer 43 (*)    GFR calc Af Amer 50 (*)    All other components within normal limits  CULTURE, EXPECTORATED SPUTUM-ASSESSMENT  INFLUENZA PANEL BY PCR (TYPE A & B)   ____________________________________________  EKG   ____________________________________________  RADIOLOGY Robinette Haines, personally viewed and evaluated these images (plain radiographs) as part of my medical decision making, as well as reviewing the written report by the radiologist.  Dg Chest 2 View  Result Date: 11/28/2016 CLINICAL DATA:  Cough, shortness of breath, chest pain, myalgias and diarrhea. Smoker. EXAM: CHEST  2 VIEW COMPARISON:  08/01/2016. FINDINGS: Normal sized heart. Dense airspace opacity in the right lower lobe. Stable mildly prominent interstitial markings and mild diffuse peribronchial thickening. Unremarkable bones. IMPRESSION: 1. Dense right lower lobe pneumonia. 2. Stable mild chronic bronchitic changes. Electronically Signed   By: Claudie Revering M.D.   On: 11/28/2016 10:58    ____________________________________________    PROCEDURES  Procedure(s) performed:    Procedures    Medications  sodium chloride 0.9 % bolus 1,000 mL (0 mLs Intravenous Stopped 11/28/16 1309)  ibuprofen (ADVIL,MOTRIN) tablet 600 mg (600 mg Oral Given 11/28/16 1051)  ipratropium-albuterol (DUONEB) 0.5-2.5 (3) MG/3ML nebulizer solution 3 mL (3 mLs Nebulization Given 11/28/16 1052)  ipratropium-albuterol (DUONEB) 0.5-2.5 (3) MG/3ML nebulizer solution 3 mL (3 mLs Nebulization Given 11/28/16 1308)  levofloxacin (LEVAQUIN) IVPB 500 mg (0 mg Intravenous Stopped 11/28/16 1408)     ____________________________________________   INITIAL IMPRESSION / ASSESSMENT AND PLAN / ED COURSE  Pertinent labs & imaging results that were available during my care of the patient were reviewed by me and considered in my medical decision  making (see chart for details).  Review of the [place] CSRS was performed in accordance of the [place] prior to dispensing any controlled drugs.     Patient's diagnosis is consistent with pneumonia and HIV. Chest x-ray consistent with lower right lobe pneumonia. Patient gets extremely short of breath with walking. O2 has ranged from 93-98.  She is given 2 DuoNeb in the ED. She is tachycardic and hypotensive. WBC elevated at 21.6. Creatinine bumped to 1.36 from .89 in December. Patient was given 500 mg IV Levaquin and 1L normal saline in ED. Patient will be admitted to the hospital and report was given to Dr. Anselm Jungling.     ____________________________________________  FINAL CLINICAL IMPRESSION(S) / ED DIAGNOSES  Final diagnoses:  Community acquired pneumonia of right lower lobe of lung ([place])  HIV (human immunodeficiency virus infection) ([place])      NEW MEDICATIONS STARTED DURING THIS VISIT:  New Prescriptions   No medications on file        This chart was dictated using voice recognition software/Dragon. Despite best efforts to proofread, errors can occur which can change the meaning. Any change was purely unintentional.    Laban Emperor, PA-C 11/28/16 [place], MD 12/13/16 2117

## 2016-11-28 NOTE — Progress Notes (Signed)
Received patient from ED.

## 2016-11-28 NOTE — ED Triage Notes (Signed)
Cough, fever, chills, shortness of breath x 1 days.  Alleve taken this morning at 0600.

## 2016-11-28 NOTE — ED Notes (Signed)
See triage note states she developed body aches cough and congestion yesterday   Low grade fever on arrival

## 2016-11-28 NOTE — Progress Notes (Signed)
Went to give prn treatment because patient very short of breath.  After taking vitals and scanning patient, dinner tray arrives.  Patient now states she will not take breathing treatment until after she eats.  Verdene Rio, RN who is present in room says she will return to do breathing treatment with patient when she is ready for treatment.

## 2016-11-28 NOTE — Progress Notes (Signed)
Pharmacy Antibiotic Note  Melanie Bowen is a 56 y.o. female admitted on 11/28/2016 with CAP.  Pharmacy has been consulted for levofloxacin dosing.  Plan: Levofloxacin 750 mg IV Q24H  Height: 5\' 6"  (167.6 cm) Weight: 280 lb (127 kg) IBW/kg (Calculated) : 59.3  Temp (24hrs), Avg:98.9 F (37.2 C), Min:98.2 F (36.8 C), Max:99.5 F (37.5 C)   Recent Labs Lab 11/28/16 1202  WBC 21.6*  CREATININE 1.36*    Estimated Creatinine Clearance: 64.5 mL/min (A) (by C-G formula based on SCr of 1.36 mg/dL (H)).    Allergies  Allergen Reactions  . Morphine And Related Itching  . Gabapentin Rash  . Zanaflex  [Tizanidine] Rash    Thank you for allowing pharmacy to be a part of this patient's care.  Laural Benes, Pharm.D., BCPS Clinical Pharmacist 11/28/2016 4:14 PM

## 2016-11-28 NOTE — ED Notes (Signed)
Pt given sterile cup, notified of need for sputum culture, and instructed on how to produce sample.

## 2016-11-28 NOTE — ED Notes (Signed)
Pt visualized in NAD at this time. Resting in bed. Will continue to monitor for further patient needs.

## 2016-11-28 NOTE — Progress Notes (Signed)
This nurse administered breathing treatment after patient ate her dinner.

## 2016-11-28 NOTE — ED Notes (Signed)
Pt family member to bedside, asking when she is going to a room. This RN explained would have to wait for admitting MD to see patient, was unable to give specific time as to when patient would be going to a room. Pt appears agitated at this time, states "that's ridiculous, I've been here since 10am". This RN apologized for delay at this time.

## 2016-11-28 NOTE — H&P (Addendum)
[place] [facility] [place] NAME: Melanie Bowen    MR#:  [id number]  DATE OF BIRTH:  [birth year]  DATE OF ADMISSION:  11/28/2016  PRIMARY CARE PHYSICIAN: [facility]   REQUESTING/REFERRING PHYSICIAN: malinda  CHIEF COMPLAINT:   Chief Complaint  Patient presents with  . Cough  . Chills    HISTORY OF PRESENT ILLNESS: Melanie Bowen  is a 56 y.o. female with a known history of Arthritis, asthma, bronchitis, smoking, COPD, depression, HIV, headache, hypertension- is feeling right-sided chest pain and shortness of breath since yesterday and also have brown colored sputum production. She denies any fever but she had chills and sweating. She came to emergency room concerned with this and noted to be having pneumonia as per chest x-ray and she could not walk far enough before getting significantly short of breath so suggested to be admitted to hospitalist service.  PAST MEDICAL HISTORY:   Past Medical History:  Diagnosis Date  . Allergic rhinitis   . Arthritis    DDD  . Asthma   . Blepharitis   . Bronchitis   . Cigarette smoker   . Claustrophobia   . COPD (chronic obstructive pulmonary disease) ([place])   . Cough   . Depression   . Eye irritation   . Genital herpes   . GERD (gastroesophageal reflux disease)   . Headache   . HIV (human immunodeficiency virus infection) ([place])   . Hypertension   . Paresthesia of hand, bilateral   . Shortness of breath dyspnea    at times due to asthma    PAST SURGICAL HISTORY: Past Surgical History:  Procedure Laterality Date  . BACK SURGERY    . back surgery may 2016     lumbar   . BUNIONECTOMY Bilateral    feet  . DIAGNOSTIC LAPAROSCOPY    . ESOPHAGOGASTRODUODENOSCOPY Left 01/11/2016   Procedure: ESOPHAGOGASTRODUODENOSCOPY (EGD);  Surgeon: Hulen Luster, MD;  Location: [facility] ENDOSCOPY;  Service: Endoscopy;  Laterality: Left;  . Finger fracture Right    5th finger  . HAND SURGERY  Bilateral    carpal tunnel  . JOINT REPLACEMENT Bilateral    knees  . KNEE SURGERY Bilateral 2003,2007   total Joint  . LAPAROSCOPY FOR ECTOPIC PREGNANCY    . PERIPHERAL VASCULAR CATHETERIZATION N/A 01/12/2016   Procedure: Visceral Angiography;  Surgeon: Algernon Huxley, MD;  Location: [place] CV LAB;  Service: Cardiovascular;  Laterality: N/A;  . PERIPHERAL VASCULAR CATHETERIZATION N/A 01/12/2016   Procedure: Visceral Artery Intervention;  Surgeon: Algernon Huxley, MD;  Location: [place] CV LAB;  Service: Cardiovascular;  Laterality: N/A;  . TUBAL LIGATION    . VIDEO BRONCHOSCOPY N/A 06/30/2015   Procedure: VIDEO BRONCHOSCOPY WITHOUT FLUORO;  Surgeon: Vilinda Boehringer, MD;  Location: [facility] ORS;  Service: Cardiopulmonary;  Laterality: N/A;    SOCIAL HISTORY:  Social History  Substance Use Topics  . Smoking status: Light Tobacco Smoker    Years: 38.00    Types: Cigarettes  . Smokeless tobacco: Never Used     Comment: 1 cig daily  . Alcohol use 3.0 oz/week    5 Glasses of wine per week     Comment: wine - couple days of week    FAMILY HISTORY:  Family History  Problem Relation Age of Onset  . Breast cancer Sister     DRUG ALLERGIES:  Allergies  Allergen Reactions  . Morphine And Related Itching  . Gabapentin Rash  .  Zanaflex  [Tizanidine] Rash    REVIEW OF SYSTEMS:   CONSTITUTIONAL: No fever, fatigue or weakness.  EYES: No blurred or double vision.  EARS, NOSE, AND THROAT: No tinnitus or ear pain.  RESPIRATORY: positive for cough, shortness of breath, wheezing or hemoptysis.  CARDIOVASCULAR: No chest pain, orthopnea, edema.  GASTROINTESTINAL: No nausea, vomiting, diarrhea or abdominal pain.  GENITOURINARY: No dysuria, hematuria.  ENDOCRINE: No polyuria, nocturia,  HEMATOLOGY: No anemia, easy bruising or bleeding SKIN: No rash or lesion. MUSCULOSKELETAL: No joint pain or arthritis.   NEUROLOGIC: No tingling, numbness, weakness.  PSYCHIATRY: No anxiety or  depression.   MEDICATIONS AT HOME:  Prior to Admission medications   Medication Sig Start Date End Date Taking? Authorizing Provider  albuterol (PROVENTIL HFA;VENTOLIN HFA) 108 (90 BASE) MCG/ACT inhaler Inhale 2 puffs into the lungs every 6 (six) hours as needed for wheezing or shortness of breath.    Yes Historical Provider, MD  amLODipine (NORVASC) 10 MG tablet Take 10 mg by mouth daily.    Yes Historical Provider, MD  Fluticasone-Salmeterol (ADVAIR DISKUS) 500-50 MCG/DOSE AEPB Inhale 1 puff into the lungs 2 (two) times daily. 05/27/16  Yes Vishal Mungal, MD  GENVOYA 150-150-200-10 MG TABS tablet Take 1 tablet by mouth daily. 11/09/16  Yes Historical Provider, MD  guaiFENesin-codeine 100-10 MG/5ML syrup Take 10 mLs by mouth 3 (three) times daily as needed. 08/28/16  Yes Cari B Triplett, FNP  hydrochlorothiazide (HYDRODIURIL) 25 MG tablet Take 25 mg by mouth daily.   Yes Historical Provider, MD  ipratropium-albuterol (DUONEB) 0.5-2.5 (3) MG/3ML SOLN Inhale 3 mLs into the lungs every 6 (six) hours as needed (for wheezing/shortness of breath).    Yes Historical Provider, MD  losartan (COZAAR) 50 MG tablet Take 50 mg by mouth daily.   Yes Historical Provider, MD  mupirocin ointment (BACTROBAN) 2 % Apply 1 application topically 2 (two) times daily as needed (for rash).    Yes Historical Provider, MD  omeprazole (PRILOSEC) 40 MG capsule Take 40 mg by mouth daily.    Yes Historical Provider, MD  oxyCODONE (ROXICODONE) 15 MG immediate release tablet Take 15 mg by mouth every 6 (six) hours as needed for pain.   Yes Historical Provider, MD  promethazine (PHENERGAN) 25 MG tablet Take 25 mg by mouth every 6 (six) hours as needed for nausea or vomiting.    Yes Historical Provider, MD  valACYclovir (VALTREX) 500 MG tablet Take 500 mg by mouth 2 (two) times daily.   Yes Historical Provider, MD  amoxicillin (AMOXIL) 500 MG tablet Take 1 tablet (500 mg total) by mouth 3 (three) times daily. Patient not taking:  Reported on 11/28/2016 08/28/16   Victorino Dike, FNP  dicyclomine (BENTYL) 10 MG capsule Take 1 capsule (10 mg total) by mouth 3 (three) times daily as needed (Abdominal cramping). Patient not taking: Reported on 11/28/2016 01/18/16   Max Sane, MD  efavirenz-emtricitabine-tenofovir (ATRIPLA) [phone] MG per tablet Take 1 tablet by mouth at bedtime.    Historical Provider, MD  Fluticasone-Salmeterol (ADVAIR) 250-50 MCG/DOSE AEPB Inhale 1 puff into the lungs 2 (two) times daily.    Historical Provider, MD  predniSONE (STERAPRED UNI-PAK 21 TAB) 10 MG (21) TBPK tablet Take 6 tablets on day 1 Take 5 tablets on day 2 Take 4 tablets on day 3 Take 3 tablets on day 4 Take 2 tablets on day 5 Take 1 tablet on day 6 Patient not taking: Reported on 11/28/2016 08/28/16   Victorino Dike, FNP  valACYclovir (  VALTREX) 500 MG tablet Take 500 mg by mouth 2 (two) times daily.    Historical Provider, MD      PHYSICAL EXAMINATION:   VITAL SIGNS: Blood pressure (!) 94/51, pulse (!) 109, temperature 98.2 F (36.8 C), temperature source Oral, resp. rate (!) 24, height 5\' 6"  (1.676 m), weight 127 kg (280 lb), SpO2 93 %.  GENERAL:  56 y.o.-year-old obese patient lying in the bed with no acute distress.  EYES: Pupils equal, round, reactive to light and accommodation. No scleral icterus. Extraocular muscles intact.  HEENT: Head atraumatic, normocephalic. Oropharynx and nasopharynx clear.  NECK:  Supple, no jugular venous distention. No thyroid enlargement, no tenderness.  LUNGS: Normal breath sounds bilaterally, no wheezing, some crepitation. No use of accessory muscles of respiration.  CARDIOVASCULAR: S1, S2 normal. No murmurs, rubs, or gallops.  ABDOMEN: Soft, nontender, nondistended. Bowel sounds present. No organomegaly or mass.  EXTREMITIES: No pedal edema, cyanosis, or clubbing.  NEUROLOGIC: Cranial nerves II through XII are intact. Muscle strength 5/5 in all extremities. Sensation intact. Gait not checked.   PSYCHIATRIC: The patient is alert and oriented x 3.  SKIN: No obvious rash, lesion, or ulcer.   LABORATORY PANEL:   CBC  Recent Labs Lab 11/28/16 1202  WBC 21.6*  HGB 9.6*  HCT 29.6*  PLT 365  MCV 79.6*  MCH 25.8*  MCHC 32.4  RDW 17.5*   ------------------------------------------------------------------------------------------------------------------  Chemistries   Recent Labs Lab 11/28/16 1202  NA 133*  K 3.2*  CL 103  CO2 21*  GLUCOSE 108*  BUN 22*  CREATININE 1.36*  CALCIUM 7.6*  AST 24  ALT 13*  ALKPHOS 59  BILITOT 0.9   ------------------------------------------------------------------------------------------------------------------ estimated creatinine clearance is 64.5 mL/min (A) (by C-G formula based on SCr of 1.36 mg/dL (H)). ------------------------------------------------------------------------------------------------------------------ No results for input(s): TSH, T4TOTAL, T3FREE, THYROIDAB in the last 72 hours.  Invalid input(s): FREET3   Coagulation profile No results for input(s): INR, PROTIME in the last 168 hours. ------------------------------------------------------------------------------------------------------------------- No results for input(s): DDIMER in the last 72 hours. -------------------------------------------------------------------------------------------------------------------  Cardiac Enzymes No results for input(s): CKMB, TROPONINI, MYOGLOBIN in the last 168 hours.  Invalid input(s): CK ------------------------------------------------------------------------------------------------------------------ Invalid input(s): POCBNP  ---------------------------------------------------------------------------------------------------------------  Urinalysis    Component Value Date/Time   COLORURINE YELLOW (A) 01/16/2016 1715   APPEARANCEUR CLEAR (A) 01/16/2016 1715   APPEARANCEUR Hazy 05/19/2014 1633   LABSPEC 1.015  01/16/2016 1715   LABSPEC 1.020 05/19/2014 1633   PHURINE 5.0 01/16/2016 1715   GLUCOSEU NEGATIVE 01/16/2016 1715   GLUCOSEU Negative 05/19/2014 1633   HGBUR NEGATIVE 01/16/2016 [place] 01/16/2016 1715   BILIRUBINUR Negative 05/19/2014 1633   KETONESUR NEGATIVE 01/16/2016 1715   PROTEINUR NEGATIVE 01/16/2016 1715   NITRITE NEGATIVE 01/16/2016 1715   LEUKOCYTESUR NEGATIVE 01/16/2016 1715   LEUKOCYTESUR Negative 05/19/2014 1633     RADIOLOGY: Dg Chest 2 View  Result Date: 11/28/2016 CLINICAL DATA:  Cough, shortness of breath, chest pain, myalgias and diarrhea. Smoker. EXAM: CHEST  2 VIEW COMPARISON:  08/01/2016. FINDINGS: Normal sized heart. Dense airspace opacity in the right lower lobe. Stable mildly prominent interstitial markings and mild diffuse peribronchial thickening. Unremarkable bones. IMPRESSION: 1. Dense right lower lobe pneumonia. 2. Stable mild chronic bronchitic changes. Electronically Signed   By: Claudie Revering M.D.   On: 11/28/2016 10:58    EKG: Orders placed or performed during the hospital encounter of 08/01/16  . ED EKG  . ED EKG  . EKG 12-Lead  . EKG 12-Lead  .  EKG    IMPRESSION AND PLAN:  * Pneumonia   We'll give Levaquin and get sputum culture.   Patient has HIV so if she does not improve we may need to consult infectious disease.   As per the patient her last CD4 count was 900 and her viral load was undetectable, and she takes her medications regularly.  * HIV infection   As mentioned ago, currently stable and takes medication regularly.  * COPD   No signs of exacerbation, continue inhalers and nebulizer therapy.  * Hypertension   Because patient presented with borderline low blood pressure I will hold antihypertensive medications at this time.  * Active smoking   Counseled to quit smoking for 4 minutes, offered nicotine patch.  All the records are reviewed and case discussed with ED provider. Management plans discussed with  the patient, family and they are in agreement.  CODE STATUS: Full code Code Status History    Date Active Date Inactive Code Status Order ID Comments User Context   01/16/2016  4:43 PM 01/18/2016  1:58 PM Full Code [id number]  Hillary Bow, MD ED   01/10/2016  2:51 AM 01/14/2016 12:54 PM Full Code [id number]  Lance Coon, MD Inpatient   12/31/2014  1:45 PM 01/02/2015 12:59 PM Full Code [id number]  Earnie Larsson, MD Inpatient       TOTAL TIME TAKING CARE OF THIS PATIENT: 45 minutes.   Patient's 2 daughters are present in the room during my visit.  Vaughan Basta M.D on 11/28/2016   Between 7am to 6pm - Pager - [phone]  After 6pm go to [email or web address] - password [email or web address] [facility]  Office  [phone]  CC: Primary care physician; [facility]   Note: This dictation was prepared with Dragon dictation along with smaller phrase technology. Any transcriptional errors that result from this process are unintentional.

## 2016-11-28 NOTE — ED Notes (Signed)
Pt walked in hallway, O2 sat remained 93-96%. Reported to PA.

## 2016-11-28 NOTE — ED Provider Notes (Signed)
I asked by PA to come to see the patient. She is to Make an tachycardic and dyspneic. She has a high white count BUN and creatinine of gone up. She has a fairly large right sided pneumonia. She looks ill I recommended admission.   Nena Polio, MD 11/28/16 [phone]

## 2016-11-29 ENCOUNTER — Inpatient Hospital Stay: Payer: Medicare Other

## 2016-11-29 LAB — URINALYSIS, COMPLETE (UACMP) WITH MICROSCOPIC
Bacteria, UA: NONE SEEN
Bilirubin Urine: NEGATIVE
Glucose, UA: NEGATIVE mg/dL
Hgb urine dipstick: NEGATIVE
Ketones, ur: NEGATIVE mg/dL
Leukocytes, UA: NEGATIVE
Nitrite: NEGATIVE
Protein, ur: NEGATIVE mg/dL
Specific Gravity, Urine: 1.015 (ref 1.005–1.030)
pH: 5 (ref 5.0–8.0)

## 2016-11-29 LAB — BASIC METABOLIC PANEL
Anion gap: 11 (ref 5–15)
BUN: 35 mg/dL — ABNORMAL HIGH (ref 6–20)
CO2: 19 mmol/L — ABNORMAL LOW (ref 22–32)
Calcium: 8.1 mg/dL — ABNORMAL LOW (ref 8.9–10.3)
Chloride: 101 mmol/L (ref 101–111)
Creatinine, Ser: 2.02 mg/dL — ABNORMAL HIGH (ref 0.44–1.00)
GFR calc Af Amer: 31 mL/min — ABNORMAL LOW (ref >60)
GFR calc non Af Amer: 27 mL/min — ABNORMAL LOW (ref >60)
Glucose, Bld: 145 mg/dL — ABNORMAL HIGH (ref 65–99)
Potassium: 3.3 mmol/L — ABNORMAL LOW (ref 3.5–5.1)
Sodium: 131 mmol/L — ABNORMAL LOW (ref 135–145)

## 2016-11-29 LAB — PROTEIN / CREATININE RATIO, URINE
Creatinine, Urine: 238 mg/dL
Protein Creatinine Ratio: 0.17 mg/mg{Cre} — ABNORMAL HIGH (ref 0.00–0.15)
Total Protein, Urine: 40 mg/dL

## 2016-11-29 LAB — CBC
HCT: 31.2 % — ABNORMAL LOW (ref 35.0–47.0)
Hemoglobin: 9.9 g/dL — ABNORMAL LOW (ref 12.0–16.0)
MCH: 25.5 pg — ABNORMAL LOW (ref 26.0–34.0)
MCHC: 31.6 g/dL — ABNORMAL LOW (ref 32.0–36.0)
MCV: 80.6 fL (ref 80.0–100.0)
Platelets: 381 10*3/uL (ref 150–440)
RBC: 3.88 MIL/uL (ref 3.80–5.20)
RDW: 18 % — ABNORMAL HIGH (ref 11.5–14.5)
WBC: 28.2 10*3/uL — ABNORMAL HIGH (ref 3.6–11.0)

## 2016-11-29 LAB — GLUCOSE, CAPILLARY
Glucose-Capillary: 126 mg/dL — ABNORMAL HIGH (ref 65–99)
Glucose-Capillary: 133 mg/dL — ABNORMAL HIGH (ref 65–99)
Glucose-Capillary: 91 mg/dL (ref 65–99)

## 2016-11-29 LAB — MRSA PCR SCREENING: MRSA by PCR: POSITIVE — AB

## 2016-11-29 MED ORDER — HYDROCORTISONE 0.5 % EX CREA
TOPICAL_CREAM | Freq: Two times a day (BID) | CUTANEOUS | Status: DC | PRN
  Administered 2016-11-29: 19:00:00 via TOPICAL
  Filled 2016-11-29 (×2): qty 28.35

## 2016-11-29 MED ORDER — PIPERACILLIN-TAZOBACTAM 3.375 G IVPB: 3.3750 g | Freq: Three times a day (TID) | INTRAVENOUS | Status: DC

## 2016-11-29 MED ORDER — POTASSIUM CHLORIDE IN NACL 20-0.9 MEQ/L-% IV SOLN
INTRAVENOUS | Status: DC
  Administered 2016-11-29 – 2016-11-30 (×4): via INTRAVENOUS
  Filled 2016-11-29 (×8): qty 1000

## 2016-11-29 MED ORDER — HYDRALAZINE HCL 20 MG/ML IJ SOLN: 10.0000 mg | Freq: Four times a day (QID) | INTRAMUSCULAR | Status: DC | PRN

## 2016-11-29 MED ORDER — PIPERACILLIN SOD-TAZOBACTAM SO 3.375 (3-0.375) G IV SOLR
3.3750 g | Freq: Three times a day (TID) | INTRAVENOUS | Status: DC
  Administered 2016-11-29 – 2016-11-30 (×3): 3.375 g via INTRAVENOUS
  Filled 2016-11-29 (×7): qty 3.38

## 2016-11-29 MED ORDER — ALPRAZOLAM 0.25 MG PO TABS
0.2500 mg | ORAL_TABLET | Freq: Once | ORAL | Status: AC
  Administered 2016-11-29: 0.25 mg via ORAL
  Filled 2016-11-29: qty 1

## 2016-11-29 MED ORDER — LEVOFLOXACIN IN D5W 750 MG/150ML IV SOLN: 750.0000 mg | INTRAVENOUS | Status: DC

## 2016-11-29 NOTE — Progress Notes (Signed)
Pharmacy Antibiotic Note  Melanie Bowen is a 56 y.o. female admitted on 11/28/2016 with PNA.  Pharmacy has been consulted for Zosyn dosing.  Plan: Zosyn 3.375 gm IV Q8H EI  Height: 5\' 6"  (167.6 cm) Weight: 280 lb (127 kg) IBW/kg (Calculated) : 59.3  Temp (24hrs), Avg:98.1 F (36.7 C), Min:98 F (36.7 C), Max:98.2 F (36.8 C)   Recent Labs Lab 11/28/16 1202 11/29/16 0333  WBC 21.6* 28.2*  CREATININE 1.36* 2.02*    Estimated Creatinine Clearance: 43.4 mL/min (A) (by C-G formula based on SCr of 2.02 mg/dL (H)).    Allergies  Allergen Reactions  . Morphine And Related Itching  . Gabapentin Rash  . Zanaflex  [Tizanidine] Rash    Thank you for allowing pharmacy to be a part of this patient's care.  Laural Benes, Pharm.D., BCPS Clinical Pharmacist 11/29/2016 4:47 PM

## 2016-11-29 NOTE — Progress Notes (Addendum)
[place] at [place] NAME: Melanie Bowen    MR#:  [id number]  DATE OF BIRTH:  [birth year]  SUBJECTIVE:   Patient presents with cough and chills with increasing shortness of breath. Her symptoms have not much improved since admission    REVIEW OF SYSTEMS:    Review of Systems  Constitutional: Negative.  Negative for chills, fever and malaise/fatigue.  HENT: Negative.  Negative for ear discharge, ear pain, hearing loss, nosebleeds and sore throat.   Eyes: Negative.  Negative for blurred vision and pain.  Respiratory: Positive for cough and shortness of breath. Negative for hemoptysis and wheezing.   Cardiovascular: Negative.  Negative for chest pain, palpitations and leg swelling.  Gastrointestinal: Negative.  Negative for abdominal pain, blood in stool, diarrhea, nausea and vomiting.  Genitourinary: Negative.  Negative for dysuria.  Musculoskeletal: Negative.  Negative for back pain.  Skin: Negative.   Neurological: Negative for dizziness, tremors, speech change, focal weakness, seizures and headaches.  Endo/Heme/Allergies: Negative.  Does not bruise/bleed easily.  Psychiatric/Behavioral: Negative for depression, hallucinations and suicidal ideas. The patient is nervous/anxious.     Tolerating Diet:yes      DRUG ALLERGIES:   Allergies  Allergen Reactions  . Morphine And Related Itching  . Gabapentin Rash  . Zanaflex  [Tizanidine] Rash    VITALS:  Blood pressure (!) 90/54, pulse (!) 102, temperature 98 F (36.7 C), temperature source Oral, resp. rate 20, height 5\' 6"  (1.676 m), weight 127 kg (280 lb), SpO2 95 %.  PHYSICAL EXAMINATION:   Physical Exam  Constitutional: She is oriented to person, place, and time. No distress.  Overweight  HENT:  Head: Normocephalic.  Eyes: No scleral icterus.  Neck: Normal range of motion. Neck supple. No JVD present. No tracheal deviation present.  Cardiovascular: Normal rate, regular  rhythm and normal heart sounds.  Exam reveals no gallop and no friction rub.   No murmur heard. Pulmonary/Chest: Effort normal and breath sounds normal. No respiratory distress. She has no wheezes. She has no rales. She exhibits no tenderness.  Decreased breath sounds right base  Abdominal: Soft. Bowel sounds are normal. She exhibits no distension and no mass. There is no tenderness. There is no rebound and no guarding.  Musculoskeletal: Normal range of motion. She exhibits no edema.  Neurological: She is alert and oriented to person, place, and time.  Skin: Skin is warm. No rash noted. No erythema.  Psychiatric: Affect and judgment normal.      LABORATORY PANEL:   CBC  Recent Labs Lab 11/29/16 0333  WBC 28.2*  HGB 9.9*  HCT 31.2*  PLT 381   ------------------------------------------------------------------------------------------------------------------  Chemistries   Recent Labs Lab 11/28/16 1202 11/29/16 0333  NA 133* 131*  K 3.2* 3.3*  CL 103 101  CO2 21* 19*  GLUCOSE 108* 145*  BUN 22* 35*  CREATININE 1.36* 2.02*  CALCIUM 7.6* 8.1*  AST 24  --   ALT 13*  --   ALKPHOS 59  --   BILITOT 0.9  --    ------------------------------------------------------------------------------------------------------------------  Cardiac Enzymes No results for input(s): TROPONINI in the last 168 hours. ------------------------------------------------------------------------------------------------------------------  RADIOLOGY:  Dg Chest 2 View  Result Date: 11/28/2016 CLINICAL DATA:  Cough, shortness of breath, chest pain, myalgias and diarrhea. Smoker. EXAM: CHEST  2 VIEW COMPARISON:  08/01/2016. FINDINGS: Normal sized heart. Dense airspace opacity in the right lower lobe. Stable mildly prominent interstitial markings and mild diffuse peribronchial thickening. Unremarkable bones. IMPRESSION: 1. Dense  right lower lobe pneumonia. 2. Stable mild chronic bronchitic changes.  Electronically Signed   By: Claudie Revering M.D.   On: 11/28/2016 10:58     ASSESSMENT AND PLAN:   56 year old female with well-controlled HIV with undetectable viral load 3 months ago who presented with shortness of breath and cough and found to have dense right lower lobe consolidation.   1. Acute hypoxic respiratory failure in the setting of dense right lower lobe consolidation Wean oxygen as tolerated Noncontrast CT to further assess pneumonia Pulmonary consultation requested   2. Right lower lobe pneumonia: She is currently on Levaquin. White blood cell count is slightly increasing. Her last viral load was undetectable as per her report, less likely PCP pneumonia I will request ID and pulmonary consultation   3.   Well controlled HIV: Repeat CD4 count  ID consult Continue HIV medications  4. Acute kidney injury in the setting of hypoxia and pneumonia Start IV fluids Nephrology consultation Hold nephrotoxic agents Repeat BMP in a.m.  5. Hyponatremia in the setting of poor by mouth Start IV fluids and repeat in a.m.  6. Tobacco dependence: Patient is encouraged to quit smoking. Counseling was provided for 4 minutes. 7. MILD intermittant Asthma without exacerbation Continue  DUONEBS  8. hypokalmia: Replete and recheck in a.m.  9. Anemia chronic disease : hemoglobin stable WILL monitor for signs of acute bleeding   Management plans discussed with the patient and she is in agreement.  CODE STATUS: FULL  TOTAL TIME TAKING CARE OF THIS PATIENT: 33 minutes.     POSSIBLE D/C 2-3 days, DEPENDING ON CLINICAL CONDITION.   Ollin Hochmuth M.D on 11/29/2016 at 7:57 AM  Between 7am to 6pm - Pager - [phone] After 6pm go to [email or web address] - password [email or web address] [facility]  Office  [phone]  CC: Primary care physician; [facility]  Note: This dictation was prepared with Dragon dictation along with smaller phrase technology.  Any transcriptional errors that result from this process are unintentional.

## 2016-11-29 NOTE — Consult Note (Signed)
[facility] CONSULT NOTE    Date: 11/29/2016                  Patient Name:  Melanie Bowen  MRN: [id number]  DOB: May 06, 1961  Age / Sex: 56 y.o., female         PCP: [facility]                 Service Requesting Consult: hospitalist                 Reason for Consult: Acute renal failure            History of Present Illness: Patient is a 56 y.o. female with a PMHx of allergic rhinitis, asthma, tobacco abuse, COPD, depression, GERD, well-controlled HIV,hypertension, who was admitted to [facility] on 11/28/2016 for evaluation of cough and shortness of breath.  She was diagnosed with multifocal pneumonia some of which had a cavitary appearance.  As above she has well-controlled HIV with an undetectable viral load and high CD4 count.  She is actively followed by Dr. [name].  We are asked to see her for evaluation management of acute renal failure.  Several days preceding admission her feel intake was reduced.  Baseline creatinine on 08/01/16 was 0.89.  Upon admission creatinine was 1.36. Creatinine rose to 2.02 today.   o new urinalysis conducted.   Medications: Outpatient medications: Prescriptions Prior to Admission  Medication Sig Dispense Refill Last Dose  . albuterol (PROVENTIL HFA;VENTOLIN HFA) 108 (90 BASE) MCG/ACT inhaler Inhale 2 puffs into the lungs every 6 (six) hours as needed for wheezing or shortness of breath.    prn at prn  . amLODipine (NORVASC) 10 MG tablet Take 10 mg by mouth daily.    11/28/2016 at 0600  . Fluticasone-Salmeterol (ADVAIR DISKUS) 500-50 MCG/DOSE AEPB Inhale 1 puff into the lungs 2 (two) times daily. 60 each 5 11/28/2016 at am  . GENVOYA 150-150-200-10 MG TABS tablet Take 1 tablet by mouth daily.   11/28/2016 at 0600  . guaiFENesin-codeine 100-10 MG/5ML syrup Take 10 mLs by mouth 3 (three) times daily as needed. 120 mL 0 prn at prn  . hydrochlorothiazide (HYDRODIURIL) 25 MG tablet Take 25 mg by mouth daily.   11/28/2016  at 0600  . ipratropium-albuterol (DUONEB) 0.5-2.5 (3) MG/3ML SOLN Inhale 3 mLs into the lungs every 6 (six) hours as needed (for wheezing/shortness of breath).    prn at prn  . losartan (COZAAR) 50 MG tablet Take 50 mg by mouth daily.   11/28/2016 at 0600  . mupirocin ointment (BACTROBAN) 2 % Apply 1 application topically 2 (two) times daily as needed (for rash).    prn at prn  . omeprazole (PRILOSEC) 40 MG capsule Take 40 mg by mouth daily.    11/28/2016 at 0600  . oxyCODONE (ROXICODONE) 15 MG immediate release tablet Take 15 mg by mouth every 6 (six) hours as needed for pain.   prn at prn  . promethazine (PHENERGAN) 25 MG tablet Take 25 mg by mouth every 6 (six) hours as needed for nausea or vomiting.    prn at prn  . valACYclovir (VALTREX) 500 MG tablet Take 500 mg by mouth 2 (two) times daily.   11/28/2016 at 0600  . amoxicillin (AMOXIL) 500 MG tablet Take 1 tablet (500 mg total) by mouth 3 (three) times daily. (Patient not taking: Reported on 11/28/2016) 30 tablet 0 Completed Course at Unknown time  . dicyclomine (BENTYL) 10 MG capsule  Take 1 capsule (10 mg total) by mouth 3 (three) times daily as needed (Abdominal cramping). (Patient not taking: Reported on 11/28/2016) 10 capsule 0 Not Taking at Unknown time  . efavirenz-emtricitabine-tenofovir (ATRIPLA) 600-200-300 MG per tablet Take 1 tablet by mouth at bedtime.   Not Taking at Unknown time  . Fluticasone-Salmeterol (ADVAIR) 250-50 MCG/DOSE AEPB Inhale 1 puff into the lungs 2 (two) times daily.   Not Taking at Unknown time  . predniSONE (STERAPRED UNI-PAK 21 TAB) 10 MG (21) TBPK tablet Take 6 tablets on day 1 Take 5 tablets on day 2 Take 4 tablets on day 3 Take 3 tablets on day 4 Take 2 tablets on day 5 Take 1 tablet on day 6 (Patient not taking: Reported on 11/28/2016) 21 tablet 0 Not Taking at Unknown time  . valACYclovir (VALTREX) 500 MG tablet Take 500 mg by mouth 2 (two) times daily.   Not Taking at Unknown time    Current medications: Current  Facility-Administered Medications  Medication Dose Route Frequency Provider Last Rate Last Dose  . 0.9 % NaCl with KCl 20 mEq/ L  infusion   Intravenous Continuous Bettey Costa, MD 100 mL/hr at 11/29/16 0949    . dicyclomine (BENTYL) capsule 10 mg  10 mg Oral TID PRN Vaughan Basta, MD      . docusate sodium (COLACE) capsule 100 mg  100 mg Oral BID PRN Vaughan Basta, MD      . elvitegravir-cobicistat-emtricitabine-tenofovir (GENVOYA) 150-150-200-10 MG tablet 1 tablet  1 tablet Oral QHS Vaughan Basta, MD   1 tablet at 11/28/16 2221  . guaiFENesin-codeine 100-10 MG/5ML solution 10 mL  10 mL Oral Q6H PRN Vaughan Basta, MD   10 mL at 11/28/16 2126  . heparin injection 5,000 Units  5,000 Units Subcutaneous Q8H Vaughan Basta, MD   5,000 Units at 11/29/16 0948  . hydrALAZINE (APRESOLINE) injection 10 mg  10 mg Intravenous Q6H PRN Sital Mody, MD      . ipratropium-albuterol (DUONEB) 0.5-2.5 (3) MG/3ML nebulizer solution 3 mL  3 mL Inhalation Q4H PRN Vaughan Basta, MD   3 mL at 11/29/16 0727  . [START ON 12/01/2016] levofloxacin (LEVAQUIN) IVPB 750 mg  750 mg Intravenous Q48H Vaughan Basta, MD      . nicotine (NICODERM CQ - dosed in mg/24 hours) patch 21 mg  21 mg Transdermal Daily Vaughan Basta, MD   21 mg at 11/29/16 0949  . oxyCODONE (Oxy IR/ROXICODONE) immediate release tablet 15 mg  15 mg Oral Q6H PRN Vaughan Basta, MD   15 mg at 11/29/16 1603  . pantoprazole (PROTONIX) EC tablet 40 mg  40 mg Oral QAC breakfast Vaughan Basta, MD   40 mg at 11/29/16 0949  . phenol (CHLORASEPTIC) mouth spray 1 spray  1 spray Mouth/Throat PRN Vaughan Basta, MD   1 spray at 11/28/16 2236  . promethazine (PHENERGAN) tablet 25 mg  25 mg Oral Q6H PRN Vaughan Basta, MD      . valACYclovir (VALTREX) tablet 500 mg  500 mg Oral BID Vaughan Basta, MD   500 mg at 11/29/16 0949      Allergies: Allergies  Allergen Reactions  .  Morphine And Related Itching  . Gabapentin Rash  . Zanaflex  [Tizanidine] Rash      Past Medical History: Past Medical History:  Diagnosis Date  . Allergic rhinitis   . Arthritis    DDD  . Asthma   . Blepharitis   . Bronchitis   . Cigarette smoker   .  Claustrophobia   . COPD (chronic obstructive pulmonary disease) ([place])   . Cough   . Depression   . Eye irritation   . Genital herpes   . GERD (gastroesophageal reflux disease)   . Headache   . HIV (human immunodeficiency virus infection) ([place])   . Hypertension   . Paresthesia of hand, bilateral   . Shortness of breath dyspnea    at times due to asthma     Past Surgical History: Past Surgical History:  Procedure Laterality Date  . BACK SURGERY    . back surgery may 2016     lumbar   . BUNIONECTOMY Bilateral    feet  . DIAGNOSTIC LAPAROSCOPY    . ESOPHAGOGASTRODUODENOSCOPY Left 01/11/2016   Procedure: ESOPHAGOGASTRODUODENOSCOPY (EGD);  Surgeon: Hulen Luster, MD;  Location: [facility] ENDOSCOPY;  Service: Endoscopy;  Laterality: Left;  . Finger fracture Right    5th finger  . HAND SURGERY Bilateral    carpal tunnel  . JOINT REPLACEMENT Bilateral    knees  . KNEE SURGERY Bilateral 2003,2007   total Joint  . LAPAROSCOPY FOR ECTOPIC PREGNANCY    . PERIPHERAL VASCULAR CATHETERIZATION N/A 01/12/2016   Procedure: Visceral Angiography;  Surgeon: Algernon Huxley, MD;  Location: [facility];  Service: Cardiovascular;  Laterality: N/A;  . PERIPHERAL VASCULAR CATHETERIZATION N/A 01/12/2016   Procedure: Visceral Artery Intervention;  Surgeon: Algernon Huxley, MD;  Location: [facility];  Service: Cardiovascular;  Laterality: N/A;  . TUBAL LIGATION    . VIDEO BRONCHOSCOPY N/A 06/30/2015   Procedure: VIDEO BRONCHOSCOPY WITHOUT FLUORO;  Surgeon: Vilinda Boehringer, MD;  Location: [facility] ORS;  Service: Cardiopulmonary;  Laterality: N/A;     Family History: Family History  Problem Relation Age of Onset  . Breast cancer Sister       Social History: Social History   Social History  . Marital status: Single    Spouse name: N/A  . Number of children: N/A  . Years of education: N/A   Occupational History  . Not on file.   Social History Main Topics  . Smoking status: Light Tobacco Smoker    Years: 38.00    Types: Cigarettes  . Smokeless tobacco: Never Used     Comment: 1 cig daily  . Alcohol use 3.0 oz/week    5 Glasses of wine per week     Comment: wine - couple days of week  . Drug use: No     Comment: former  . Sexual activity: Not on file   Other Topics Concern  . Not on file   Social History Narrative  . No narrative on file     Review of Systems: Review of Systems  Constitutional: Positive for chills and malaise/fatigue. Negative for weight loss.  HENT: Positive for congestion. Negative for hearing loss and nosebleeds.   Eyes: Negative for blurred vision and double vision.  Respiratory: Positive for cough, sputum production and shortness of breath.   Cardiovascular: Negative for chest pain and palpitations.  Gastrointestinal: Negative for heartburn, nausea and vomiting.  Genitourinary: Negative for dysuria, frequency and urgency.  Musculoskeletal: Positive for back pain. Negative for myalgias.  Skin: Negative for rash.  Neurological: Negative for dizziness and focal weakness.  Endo/Heme/Allergies: Negative for polydipsia. Does not bruise/bleed easily.  Psychiatric/Behavioral: Positive for depression. The patient is not nervous/anxious.      Vital Signs: Blood pressure (!) 98/44, pulse (!) 107, temperature 98.2 F (36.8 C), temperature source Oral, resp. rate 20, height  5\' 6"  (1.676 m), weight 127 kg (280 lb), SpO2 97 %.  Weight trends: Filed Weights   11/28/16 1002  Weight: 127 kg (280 lb)    Physical Exam: General: NAD, sitting up in bed  Head: Normocephalic, atraumatic.  Eyes: Anicteric, EOMI  Nose: Mucous membranes moist, not inflammed, nonerythematous.  Throat:  Oropharynx nonerythematous, no exudate appreciated.   Neck: Supple, trachea midline.  Lungs:  Normal respiratory effort. Bilateral rhonchi.  Heart: RRR. S1 and S2 normal without gallop, murmur, or rubs.  Abdomen:  BS normoactive. Soft, Nondistended, non-tender.  No masses or organomegaly.  Extremities: No pretibial edema.  Neurologic: A&O X3, Motor strength is 5/5 in the all 4 extremities  Skin: No visible rashes, scars.    Lab results: Basic Metabolic Panel:  Recent Labs Lab 11/28/16 1202 11/29/16 0333  NA 133* 131*  K 3.2* 3.3*  CL 103 101  CO2 21* 19*  GLUCOSE 108* 145*  BUN 22* 35*  CREATININE 1.36* 2.02*  CALCIUM 7.6* 8.1*    Liver Function Tests:  Recent Labs Lab 11/28/16 1202  AST 24  ALT 13*  ALKPHOS 59  BILITOT 0.9  PROT 6.0*  ALBUMIN 2.6*   No results for input(s): LIPASE, AMYLASE in the last 168 hours. No results for input(s): AMMONIA in the last 168 hours.  CBC:  Recent Labs Lab 11/28/16 1202 11/29/16 0333  WBC 21.6* 28.2*  HGB 9.6* 9.9*  HCT 29.6* 31.2*  MCV 79.6* 80.6  PLT 365 381    Cardiac Enzymes: No results for input(s): CKTOTAL, CKMB, CKMBINDEX, TROPONINI in the last 168 hours.  BNP: Invalid input(s): POCBNP  CBG:  Recent Labs Lab 11/29/16 0716 11/29/16 1152 11/29/16 1625  GLUCAP 126* 133* 91    Microbiology: Results for orders placed or performed during the hospital encounter of 01/10/16  MRSA PCR Screening     Status: None   Collection Time: 01/10/16  2:35 PM  Result Value Ref Range Status   MRSA by PCR NEGATIVE NEGATIVE Final    Comment:        The GeneXpert MRSA Assay (FDA approved for NASAL specimens only), is one component of a comprehensive MRSA colonization surveillance program. It is not intended to diagnose MRSA infection nor to guide or monitor treatment for MRSA infections.     Coagulation Studies: No results for input(s): LABPROT, INR in the last 72 hours.  Urinalysis: No results for  input(s): COLORURINE, LABSPEC, PHURINE, GLUCOSEU, HGBUR, BILIRUBINUR, KETONESUR, PROTEINUR, UROBILINOGEN, NITRITE, LEUKOCYTESUR in the last 72 hours.  Invalid input(s): APPERANCEUR    Imaging: Dg Chest 2 View  Result Date: 11/28/2016 CLINICAL DATA:  Cough, shortness of breath, chest pain, myalgias and diarrhea. Smoker. EXAM: CHEST  2 VIEW COMPARISON:  08/01/2016. FINDINGS: Normal sized heart. Dense airspace opacity in the right lower lobe. Stable mildly prominent interstitial markings and mild diffuse peribronchial thickening. Unremarkable bones. IMPRESSION: 1. Dense right lower lobe pneumonia. 2. Stable mild chronic bronchitic changes. Electronically Signed   By: Claudie Revering M.D.   On: 11/28/2016 10:58   Ct Chest Wo Contrast  Result Date: 11/29/2016 CLINICAL DATA:  Pneumonia. Progressive chest pain, cough, and shortness of breath. EXAM: CT CHEST WITHOUT CONTRAST TECHNIQUE: Multidetector CT imaging of the chest was performed following the standard protocol without IV contrast. COMPARISON:  Chest x-ray dated 11/28/2016 and CT scan dated 06/27/2015 FINDINGS: Cardiovascular: Aortic and coronary atherosclerosis. Overall heart size is normal. No pericardial effusion. Mediastinum/Nodes: No enlarged mediastinal or axillary lymph nodes. Thyroid gland, trachea, and  esophagus demonstrate no significant findings. Lungs/Pleura: There is a dense consolidative infiltrate of the majority of left lower lobe. There is a small patchy cavitary infiltrate in the lateral aspect of the left upper lobe. There small patchy areas of infiltrate in the left lower lobe posterior medially. No effusions. Upper Abdomen: Normal. Musculoskeletal: No chest wall mass or suspicious bone lesions identified. IMPRESSION: Multifocal pneumonia. Dense extensive consolidation in the right lower lobe. Cavitary patchy infiltrate in the left upper lobe. Patchy infiltrate in the left lower lobe. Electronically Signed   By: Lorriane Shire M.D.   On:  11/29/2016 09:47      Assessment & Plan: Pt is a 56 y.o. female with a PMHx of allergic rhinitis, asthma, tobacco abuse, COPD, depression, GERD, well-controlled HIV,hypertension, who was admitted to [facility] on 11/28/2016 for evaluation of cough and shortness of breath.   1.  Acute renal failure. 2.  Bacterial pneumonia, with some cavitary lesions noted.  3.  Anemia unspecified.   4.  Hypokalemia.    Plan:  Patient presents with an interesting case.  She has a bacterial pneumonia with cavitary lesion noted.  She previously had normal renal function.  Given the cavitary pneumonia we will check ANCA antibodies as well as GBM antibodies.  Infectious disease has already been consulted and we appreciate Dr. Blane Ohara input.  We will obtain a renal ultrasound to make sure there is no underlying hydronephrosis as well.  Continue 0.9 normal saline at 100 cc per hour and continue to monitor renal function daily.  We will also check urinalysis and urine protein to creatinine ratio.

## 2016-11-29 NOTE — Progress Notes (Signed)
Pt was noted to be very restless, short of breath, c/o throat irritation and unable to sleep. Chloraseptic spray encouraged, 2Lpm O2 inhalation administered, called Respiratory therapist Mikki Santee to give PRN breathing treatment and spoke to Dr. Almyra Free and ordered for one time dose Xanax 0.25mg  PO. Will administer and continue to monitor.
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
## 2016-11-29 NOTE — Plan of Care (Signed)
Secretary s/w Pulmonary Consult referred to Dr. Simonne Maffucci who referred to Dr. Raul Del; which MAY consider Bronchoscopy And Dr. Rosita Fire. 11/29/16.

## 2016-11-30 ENCOUNTER — Inpatient Hospital Stay: Payer: Medicare Other

## 2016-11-30 ENCOUNTER — Telehealth: Payer: Self-pay | Admitting: Pulmonary Disease

## 2016-11-30 DIAGNOSIS — J984 Other disorders of lung: Secondary | ICD-10-CM

## 2016-11-30 DIAGNOSIS — B20 Human immunodeficiency virus [HIV] disease: Secondary | ICD-10-CM

## 2016-11-30 DIAGNOSIS — J181 Lobar pneumonia, unspecified organism: Secondary | ICD-10-CM

## 2016-11-30 DIAGNOSIS — F17200 Nicotine dependence, unspecified, uncomplicated: Secondary | ICD-10-CM

## 2016-11-30 LAB — CBC
HCT: 27.3 % — ABNORMAL LOW (ref 35.0–47.0)
Hemoglobin: 8.9 g/dL — ABNORMAL LOW (ref 12.0–16.0)
MCH: 25.7 pg — ABNORMAL LOW (ref 26.0–34.0)
MCHC: 32.4 g/dL (ref 32.0–36.0)
MCV: 79.4 fL — ABNORMAL LOW (ref 80.0–100.0)
Platelets: 362 10*3/uL (ref 150–440)
RBC: 3.44 MIL/uL — ABNORMAL LOW (ref 3.80–5.20)
RDW: 17.7 % — ABNORMAL HIGH (ref 11.5–14.5)
WBC: 25.7 10*3/uL — ABNORMAL HIGH (ref 3.6–11.0)

## 2016-11-30 LAB — BASIC METABOLIC PANEL
Anion gap: 8 (ref 5–15)
BUN: 46 mg/dL — ABNORMAL HIGH (ref 6–20)
CO2: 19 mmol/L — ABNORMAL LOW (ref 22–32)
Calcium: 7.9 mg/dL — ABNORMAL LOW (ref 8.9–10.3)
Chloride: 103 mmol/L (ref 101–111)
Creatinine, Ser: 2.17 mg/dL — ABNORMAL HIGH (ref 0.44–1.00)
GFR calc Af Amer: 28 mL/min — ABNORMAL LOW (ref >60)
GFR calc non Af Amer: 25 mL/min — ABNORMAL LOW (ref >60)
Glucose, Bld: 102 mg/dL — ABNORMAL HIGH (ref 65–99)
Potassium: 3.9 mmol/L (ref 3.5–5.1)
Sodium: 130 mmol/L — ABNORMAL LOW (ref 135–145)

## 2016-11-30 LAB — IMMATURE CELLS: Metamyelocytes: 1 % — ABNORMAL HIGH (ref 0–0)

## 2016-11-30 LAB — HELPER T-LYMPH-CD4 (ARMC ONLY)
% CD 4 Pos. Lymph.: 42.3 % (ref 30.8–58.5)
Absolute CD 4 Helper: 761 /uL (ref 359–1519)
Basophils Absolute: 0.6 10*3/uL — ABNORMAL HIGH (ref 0.0–0.2)
Basos: 2 %
EOS (ABSOLUTE): 0 10*3/uL (ref 0.0–0.4)
Eos: 0 %
Hematocrit: 29.8 % — ABNORMAL LOW (ref 34.0–46.6)
Hemoglobin: 9.4 g/dL — ABNORMAL LOW (ref 11.1–15.9)
Lymphocytes Absolute: 1.8 10*3/uL (ref 0.7–3.1)
Lymphs: 6 %
MCH: 26.3 pg — ABNORMAL LOW (ref 26.6–33.0)
MCHC: 31.5 g/dL (ref 31.5–35.7)
MCV: 84 fL (ref 79–97)
Monocytes Absolute: 1.5 10*3/uL — ABNORMAL HIGH (ref 0.1–0.9)
Monocytes: 5 %
Neutrophils Absolute: 26.1 10*3/uL — ABNORMAL HIGH (ref 1.4–7.0)
Neutrophils: 86 %
Platelets: 385 10*3/uL — ABNORMAL HIGH (ref 150–379)
RBC: 3.57 x10E6/uL — ABNORMAL LOW (ref 3.77–5.28)
RDW: 17.2 % — ABNORMAL HIGH (ref 12.3–15.4)
WBC: 30.3 10*3/uL — ABNORMAL HIGH (ref 3.4–10.8)

## 2016-11-30 LAB — HIV ANTIBODY (ROUTINE TESTING W REFLEX): HIV Screen 4th Generation wRfx: REACTIVE — AB

## 2016-11-30 LAB — EXPECTORATED SPUTUM ASSESSMENT W GRAM STAIN, RFLX TO RESP C

## 2016-11-30 LAB — MPO/PR-3 (ANCA) ANTIBODIES
ANCA Proteinase 3: <3.5 U/mL (ref 0.0–3.5)
Myeloperoxidase Abs: <9 U/mL (ref 0.0–9.0)

## 2016-11-30 LAB — EXPECTORATED SPUTUM ASSESSMENT W REFEX TO RESP CULTURE: Special Requests: NORMAL

## 2016-11-30 LAB — GLOMERULAR BASEMENT MEMBRANE ANTIBODIES: GBM Ab: 3 units (ref 0–20)

## 2016-11-30 LAB — ANA W/REFLEX IF POSITIVE: Anti Nuclear Antibody(ANA): NEGATIVE

## 2016-11-30 LAB — HIV 1/2 AB DIFFERENTIATION
HIV 1 Ab: POSITIVE — AB
HIV 2 Ab: NEGATIVE

## 2016-11-30 LAB — C3 COMPLEMENT: C3 Complement: 188 mg/dL — ABNORMAL HIGH (ref 82–167)

## 2016-11-30 LAB — C4 COMPLEMENT: Complement C4, Body Fluid: 27 mg/dL (ref 14–44)

## 2016-11-30 MED ORDER — MAGIC MOUTHWASH W/LIDOCAINE
5.0000 mL | Freq: Four times a day (QID) | ORAL | Status: DC | PRN
  Filled 2016-11-30 (×2): qty 5

## 2016-11-30 MED ORDER — METHYLPREDNISOLONE SODIUM SUCC 125 MG IJ SOLR
60.0000 mg | Freq: Two times a day (BID) | INTRAMUSCULAR | Status: DC
  Administered 2016-11-30 – 2016-12-01 (×2): 60 mg via INTRAVENOUS
  Filled 2016-11-30 (×2): qty 2

## 2016-11-30 MED ORDER — PIPERACILLIN-TAZOBACTAM 3.375 G IVPB
3.3750 g | Freq: Two times a day (BID) | INTRAVENOUS | Status: DC
  Administered 2016-11-30: 3.375 g via INTRAVENOUS
  Filled 2016-11-30 (×3): qty 50

## 2016-11-30 MED ORDER — MAGIC MOUTHWASH
5.0000 mL | Freq: Four times a day (QID) | ORAL | Status: DC | PRN
  Administered 2016-11-30: 5 mL via ORAL
  Filled 2016-11-30 (×2): qty 5

## 2016-11-30 MED ORDER — SODIUM CHLORIDE 0.9 % IV SOLN
2000.0000 mg | Freq: Once | INTRAVENOUS | Status: AC
  Administered 2016-11-30: 2000 mg via INTRAVENOUS
  Filled 2016-11-30: qty 2000

## 2016-11-30 MED ORDER — POLYETHYLENE GLYCOL 3350 17 G PO PACK
17.0000 g | PACK | Freq: Two times a day (BID) | ORAL | Status: DC | PRN
  Administered 2016-11-30: 17 g via ORAL
  Filled 2016-11-30: qty 1

## 2016-11-30 MED ORDER — LIDOCAINE VISCOUS 2 % MT SOLN
15.0000 mL | OROMUCOSAL | Status: DC | PRN
  Administered 2016-11-30: 15 mL via OROMUCOSAL
  Filled 2016-11-30 (×2): qty 15
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
## 2016-11-30 NOTE — Consult Note (Signed)
PULMONARY CONSULT NOTE  Requesting MD/Service: Hospitalist service Date of initial consultation: 11/30/2016 Reason for consultation: HIV, pneumonia  HPI:  56 y.o. female smoker with a long history of HIV, well controlled on anti-retroviral therapy with CD4 count greater than 700 during this hospitalization. She was admitted 04/01 with cough, subjective fevers and dyspnea. Her initial chest x-ray revealed right lower lobe opacity. This was followed by a CT scan of the chest - discussed below. Based on the CT scan findings, pulmonary consultation was requested. She is presently in no distress. She feels much better than on admission. She continues to have some cough and a chief complaint of sore throat. She denies CP, current fever, hemoptysis, LE edema and calf tenderness.  Past Medical History:  Diagnosis Date  . Allergic rhinitis   . Arthritis    DDD  . Asthma   . Blepharitis   . Bronchitis   . Cigarette smoker   . Claustrophobia   . COPD (chronic obstructive pulmonary disease) ([place])   . Cough   . Depression   . Eye irritation   . Genital herpes   . GERD (gastroesophageal reflux disease)   . Headache   . HIV (human immunodeficiency virus infection) ([place])   . Hypertension   . Paresthesia of hand, bilateral   . Shortness of breath dyspnea    at times due to asthma    Past Surgical History:  Procedure Laterality Date  . BACK SURGERY    . back surgery may 2016     lumbar   . BUNIONECTOMY Bilateral    feet  . DIAGNOSTIC LAPAROSCOPY    . ESOPHAGOGASTRODUODENOSCOPY Left 01/11/2016   Procedure: ESOPHAGOGASTRODUODENOSCOPY (EGD);  Surgeon: Hulen Luster, MD;  Location: [facility] ENDOSCOPY;  Service: Endoscopy;  Laterality: Left;  . Finger fracture Right    5th finger  . HAND SURGERY Bilateral    carpal tunnel  . JOINT REPLACEMENT Bilateral    knees  . KNEE SURGERY Bilateral 2003,2007   total Joint  . LAPAROSCOPY FOR ECTOPIC PREGNANCY    . PERIPHERAL VASCULAR CATHETERIZATION N/A  01/12/2016   Procedure: Visceral Angiography;  Surgeon: Algernon Huxley, MD;  Location: [place] [facility];  Service: Cardiovascular;  Laterality: N/A;  . PERIPHERAL VASCULAR CATHETERIZATION N/A 01/12/2016   Procedure: Visceral Artery Intervention;  Surgeon: Algernon Huxley, MD;  Location: [place] [facility];  Service: Cardiovascular;  Laterality: N/A;  . TUBAL LIGATION    . VIDEO BRONCHOSCOPY N/A 06/30/2015   Procedure: VIDEO BRONCHOSCOPY WITHOUT FLUORO;  Surgeon: Vilinda Boehringer, MD;  Location: [facility] ORS;  Service: Cardiopulmonary;  Laterality: N/A;    MEDICATIONS: I have reviewed all medications and confirmed regimen as documented  Social History   Social History  . Marital status: Single    Spouse name: N/A  . Number of children: N/A  . Years of education: N/A   Occupational History  . Not on file.   Social History Main Topics  . Smoking status: Light Tobacco Smoker    Years: 38.00    Types: Cigarettes  . Smokeless tobacco: Never Used     Comment: 1 cig daily  . Alcohol use 3.0 oz/week    5 Glasses of wine per week     Comment: wine - couple days of week  . Drug use: No     Comment: former  . Sexual activity: Not on file   Other Topics Concern  . Not on file   Social History Narrative  . No narrative  on file    Family History  Problem Relation Age of Onset  . Breast cancer Sister     ROS: No myalgias/arthralgias, unexplained weight loss or weight gain No new focal weakness or sensory deficits No otalgia, hearing loss, visual changes, nasal and sinus symptoms, mouth and throat problems No neck pain or adenopathy No abdominal pain, N/V/D, diarrhea, change in bowel pattern No dysuria, change in urinary pattern   Vitals:   11/29/16 1454 11/29/16 2315 11/30/16 0800 11/30/16 1534  BP: (!) 98/44 100/77 102/61 139/65  Pulse: (!) 107 (!) 112 99 (!) 105  Resp:   17   Temp: 98.2 F (36.8 C) 98.5 F (36.9 C) 98.6 F (37 C) 98.4 F (36.9 C)  TempSrc: Oral Oral Oral  Oral  SpO2: 97% 96% 97% 99%  Weight:      Height:         EXAM:  Gen: Obese, No overt respiratory distress HEENT: NCAT, sclera white, oropharynx normal Neck: Supple without LAN, thyromegaly, JVP cannot be assessed Lungs: Breath sounds are full with a bronchial quality in the right lower lobe. No wheezes noted Cardiovascular: RRR, no murmurs noted Abdomen: Soft, nontender, normal BS Ext: without clubbing, cyanosis, edema Neuro: CNs grossly intact, motor and sensory intact Skin: Limited exam, no lesions noted  DATA:   BMP Latest Ref Rng & Units 11/30/2016 11/29/2016 11/28/2016  Glucose 65 - 99 mg/dL 102(H) 145(H) 108(H)  BUN 6 - 20 mg/dL 46(H) 35(H) 22(H)  Creatinine 0.44 - 1.00 mg/dL 2.17(H) 2.02(H) 1.36(H)  Sodium 135 - 145 mmol/L 130(L) 131(L) 133(L)  Potassium 3.5 - 5.1 mmol/L 3.9 3.3(L) 3.2(L)  Chloride 101 - 111 mmol/L 103 101 103  CO2 22 - 32 mmol/L 19(L) 19(L) 21(L)  Calcium 8.9 - 10.3 mg/dL 7.9(L) 8.1(L) 7.6(L)    CBC Latest Ref Rng & Units 11/30/2016 11/29/2016 11/29/2016  WBC 3.6 - 11.0 K/uL 25.7(H) 30.3(H) 28.2(H)  Hemoglobin 12.0 - 16.0 g/dL 8.9(L) 9.9(L) -  Hematocrit 35.0 - 47.0 % 27.3(L) 29.8(L) 31.2(L)  Platelets 150 - 440 K/uL 362 385(H) 381    CXR (11/28/16):  Right lower lobe opacification CT chest (11/29/16): Dense consolidation of the right lower lobe. There is also a small area of cavitary infiltrate in the left upper lobe.  IMPRESSION:   1) HIV with CD4 > 700. Therefore, she should not be at risk for opportunistic infections 2) right lower lobe community-acquired pneumonia 3) small cavitary lesion in left upper lobe 4) smoker   PLAN:  -Continue antibiotics as directed by infectious disease service -I counseled regarding smoking cessation -She should have a follow-up chest x-ray performed in 2-3 weeks after discharge -Because of the left upper lobe cavitary lesion, she probably needs repeat CT scan of the chest in 6-8 weeks -I will arrange to see her in  follow-up in the office at which time we will reevaluate the need for repeat CT scan of the chest  PCCM will sign off. Please call if we can be of further assistance   Merton Border, MD PCCM service Mobile [phone] Pager [phone] 11/30/2016

## 2016-11-30 NOTE — Progress Notes (Signed)
Pharmacy Antibiotic Note  Melanie Bowen is a 56 y.o. female admitted on 11/28/2016 with PNA.  Pharmacy has been consulted for Zosyn dosing and now has been consulted to dose Vancomycin as well.   Plan: Patient is in AKI therefore will dose as if Crcl <10 ml/min. Will change to Zosyn 3.375 g IV q12 hours.  Vancomycin: Will dose per levels for now. Will give Vancomycin 2 g IV x 1 and will check random level 24 hours post dose.    Height: 5\' 6"  (167.6 cm) Weight: 280 lb (127 kg) IBW/kg (Calculated) : 59.3  Temp (24hrs), Avg:98.4 F (36.9 C), Min:98.2 F (36.8 C), Max:98.6 F (37 C)   Recent Labs Lab 11/28/16 1202 11/29/16 0333 11/30/16 0335  WBC 21.6* 28.2* 25.7*  CREATININE 1.36* 2.02* 2.17*    Estimated Creatinine Clearance: 40.4 mL/min (A) (by C-G formula based on SCr of 2.17 mg/dL (H)).    Allergies  Allergen Reactions  . Morphine And Related Itching  . Gabapentin Rash  . Zanaflex  [Tizanidine] Rash    Thank you for allowing pharmacy to be a part of this patient's care.  Analise Glotfelty D, Pharm.D., BCPS Clinical Pharmacist 11/30/2016 12:00 PM

## 2016-11-30 NOTE — Progress Notes (Signed)
[facility]  ROUNDING NOTE   Subjective:  Renal ultrasound performed this a.m. and was unremarkable. However renal function bit worse today with BUN up to 46 with a creatinine of 2.1. Patient remains on IV fluid hydration.   Objective:  Vital signs in last 24 hours:  Temp:  [98.2 F (36.8 C)-98.6 F (37 C)] 98.6 F (37 C) (04/03 0800) Pulse Rate:  [99-112] 99 (04/03 0800) Resp:  [17] 17 (04/03 0800) BP: (98-102)/(44-77) 102/61 (04/03 0800) SpO2:  [96 %-97 %] 97 % (04/03 0800)  Weight change:  Filed Weights   11/28/16 1002  Weight: 127 kg (280 lb)    Intake/Output: I/O last 3 completed shifts: In: 0932 [P.O.:960; I.V.:2950; IV Piggyback:100] Out: -    Intake/Output this shift:  No intake/output data recorded.  Physical Exam: General: No acute distress  Head: Normocephalic, atraumatic. Moist oral mucosal membranes  Eyes: Anicteric  Neck: Supple, trachea midline  Lungs:  Bilateral rhonchi and wheezing, normal effort  Heart: S1S2 no rubs  Abdomen:  Soft, nontender  Extremities:  peripheral edema.  Neurologic: Nonfocal, moving all four extremities  Skin: No lesions       Basic Metabolic Panel:  Recent Labs Lab 11/28/16 1202 11/29/16 0333 11/30/16 0335  NA 133* 131* 130*  K 3.2* 3.3* 3.9  CL 103 101 103  CO2 21* 19* 19*  GLUCOSE 108* 145* 102*  BUN 22* 35* 46*  CREATININE 1.36* 2.02* 2.17*  CALCIUM 7.6* 8.1* 7.9*    Liver Function Tests:  Recent Labs Lab 11/28/16 1202  AST 24  ALT 13*  ALKPHOS 59  BILITOT 0.9  PROT 6.0*  ALBUMIN 2.6*   No results for input(s): LIPASE, AMYLASE in the last 168 hours. No results for input(s): AMMONIA in the last 168 hours.  CBC:  Recent Labs Lab 11/28/16 1202 11/29/16 0333 11/30/16 0335  WBC 21.6* 28.2* 25.7*  HGB 9.6* 9.9* 8.9*  HCT 29.6* 31.2* 27.3*  MCV 79.6* 80.6 79.4*  PLT 365 381 362    Cardiac Enzymes: No results for input(s): CKTOTAL, CKMB, CKMBINDEX, TROPONINI in the last 168  hours.  BNP: Invalid input(s): POCBNP  CBG:  Recent Labs Lab 11/29/16 0716 11/29/16 1152 11/29/16 1625  GLUCAP 126* 133* 57    Microbiology: Results for orders placed or performed during the hospital encounter of 11/28/16  MRSA PCR Screening     Status: Abnormal   Collection Time: 11/29/16  4:20 PM  Result Value Ref Range Status   MRSA by PCR POSITIVE (A) NEGATIVE Final    Comment:        The GeneXpert MRSA Assay (FDA approved for NASAL specimens only), is one component of a comprehensive MRSA colonization surveillance program. It is not intended to diagnose MRSA infection nor to guide or monitor treatment for MRSA infections. RESULT CALLED TO, READ BACK BY AND VERIFIED WITH: MATTHEW WRIGHT 11/29/16 @ 1800  [place]   Culture, expectorated sputum-assessment     Status: None   Collection Time: 11/30/16  3:49 AM  Result Value Ref Range Status   Specimen Description EXPECTORATED SPUTUM  Final   Special Requests Normal  Final   Sputum evaluation THIS SPECIMEN IS ACCEPTABLE FOR SPUTUM CULTURE  Final   Report Status 11/30/2016 FINAL  Final    Coagulation Studies: No results for input(s): LABPROT, INR in the last 72 hours.  Urinalysis:  Recent Labs  11/29/16 [place] 1.015  PHURINE 5.0  [place] NEGATIVE  BILIRUBINUR  NEGATIVE  KETONESUR NEGATIVE  PROTEINUR NEGATIVE  NITRITE NEGATIVE  LEUKOCYTESUR NEGATIVE      Imaging: Ct Chest Wo Contrast  Result Date: 11/29/2016 CLINICAL DATA:  Pneumonia. Progressive chest pain, cough, and shortness of breath. EXAM: CT CHEST WITHOUT CONTRAST TECHNIQUE: Multidetector CT imaging of the chest was performed following the standard protocol without IV contrast. COMPARISON:  Chest x-ray dated 11/28/2016 and CT scan dated 06/27/2015 FINDINGS: Cardiovascular: Aortic and coronary atherosclerosis. Overall heart size is normal. No pericardial effusion. Mediastinum/Nodes: No enlarged mediastinal or  axillary lymph nodes. Thyroid gland, trachea, and esophagus demonstrate no significant findings. Lungs/Pleura: There is a dense consolidative infiltrate of the majority of left lower lobe. There is a small patchy cavitary infiltrate in the lateral aspect of the left upper lobe. There small patchy areas of infiltrate in the left lower lobe posterior medially. No effusions. Upper Abdomen: Normal. Musculoskeletal: No chest wall mass or suspicious bone lesions identified. IMPRESSION: Multifocal pneumonia. Dense extensive consolidation in the right lower lobe. Cavitary patchy infiltrate in the left upper lobe. Patchy infiltrate in the left lower lobe. Electronically Signed   By: Lorriane Shire M.D.   On: 11/29/2016 09:47   US Renal  Result Date: 11/30/2016 CLINICAL DATA:  Acute renal failure EXAM: RENAL / URINARY TRACT ULTRASOUND COMPLETE COMPARISON:  None in PACs FINDINGS: Right Kidney: Length: 11.8 cm. The renal cortical echotexture remains lower than that of the adjacent liver. There is no hydronephrosis. Left Kidney: Length: 11.2 cm. The renal cortical echotexture is similar to that on the right. There is no hydronephrosis. Bladder: Appears normal for degree of bladder distention. IMPRESSION: Normal renal ultrasound examination. Electronically Signed   By: David  Martinique M.D.   On: 11/30/2016 10:39     Medications:   . 0.9 % NaCl with KCl 20 mEq / L 100 mL/hr at 11/30/16 0349   . elvitegravir-cobicistat-emtricitabine-tenofovir  1 tablet Oral QHS  . heparin  5,000 Units Subcutaneous Q8H  . nicotine  21 mg Transdermal Daily  . pantoprazole  40 mg Oral QAC breakfast  . piperacillin-tazobactam (ZOSYN)  IV  3.375 g Intravenous Q12H  . valACYclovir  500 mg Oral BID  . vancomycin  2,000 mg Intravenous Once   dicyclomine, docusate sodium, guaiFENesin-codeine, hydrALAZINE, hydrocortisone cream, ipratropium-albuterol, oxyCODONE, phenol, promethazine  Assessment/ Plan:  56 y.o. female with a PMHx of  allergic rhinitis, asthma, tobacco abuse, COPD, depression, GERD, well-controlled HIV,hypertension, who was admitted to [facility] on 11/28/2016 for evaluation of cough and shortness of breath.   1.  Acute renal failure. 2.  Bacterial pneumonia, with some cavitary lesions noted.  3.  Anemia unspecified.   4.  Hypokalemia.    Plan:  Renal function is slightly worse today as BUN is resistant to 46 with a creatinine of 2.1. At this time continue IV fluid hydration with 0.9 normal saline. Serum potassium improved to 3.9. Continue treatment of suspected pneumonia as per infectious disease. No urgent indication for dialysis at the moment. Avoid nephrotoxins as possible.   LOS: 2 Radin Raptis 4/3/201812:10 PM

## 2016-11-30 NOTE — Telephone Encounter (Signed)
[facility] called, states Dr. Raul Del would like [facility] to do bronchoscopy. Please call.

## 2016-11-30 NOTE — Evaluation (Addendum)
Clinical/Bedside Swallow Evaluation Patient Details  Name: Melanie Bowen MRN: [id number] Date of Birth: February 07, 1961  Today's Date: 11/30/2016 Time: SLP Start Time (ACUTE ONLY): 1210 SLP Stop Time (ACUTE ONLY): 1300 SLP Time Calculation (min) (ACUTE ONLY): 50 min  Past Medical History:  Past Medical History:  Diagnosis Date  . Allergic rhinitis   . Arthritis    DDD  . Asthma   . Blepharitis   . Bronchitis   . Cigarette smoker   . Claustrophobia   . COPD (chronic obstructive pulmonary disease) ([place])   . Cough   . Depression   . Eye irritation   . Genital herpes   . GERD (gastroesophageal reflux disease)   . Headache   . HIV (human immunodeficiency virus infection) ([place])   . Hypertension   . Paresthesia of hand, bilateral   . Shortness of breath dyspnea    at times due to asthma   Past Surgical History:  Past Surgical History:  Procedure Laterality Date  . BACK SURGERY    . back surgery may 2016     lumbar   . BUNIONECTOMY Bilateral    feet  . DIAGNOSTIC LAPAROSCOPY    . ESOPHAGOGASTRODUODENOSCOPY Left 01/11/2016   Procedure: ESOPHAGOGASTRODUODENOSCOPY (EGD);  Surgeon: Hulen Luster, MD;  Location: [facility] ENDOSCOPY;  Service: Endoscopy;  Laterality: Left;  . Finger fracture Right    5th finger  . HAND SURGERY Bilateral    carpal tunnel  . JOINT REPLACEMENT Bilateral    knees  . KNEE SURGERY Bilateral 2003,2007   total Joint  . LAPAROSCOPY FOR ECTOPIC PREGNANCY    . PERIPHERAL VASCULAR CATHETERIZATION N/A 01/12/2016   Procedure: Visceral Angiography;  Surgeon: Algernon Huxley, MD;  Location: [place] [facility] LAB;  Service: Cardiovascular;  Laterality: N/A;  . PERIPHERAL VASCULAR CATHETERIZATION N/A 01/12/2016   Procedure: Visceral Artery Intervention;  Surgeon: Algernon Huxley, MD;  Location: [place] [facility] LAB;  Service: Cardiovascular;  Laterality: N/A;  . TUBAL LIGATION    . VIDEO BRONCHOSCOPY N/A 06/30/2015   Procedure: VIDEO BRONCHOSCOPY WITHOUT FLUORO;  Surgeon: Vilinda Boehringer, MD;  Location: [facility] ORS;  Service: Cardiopulmonary;  Laterality: N/A;   HPI:  Pt is a 56 y.o. female with a known history of GERD per pt report(on PPI?), Arthritis, parathesia of hands bilateral, Asthma, bronchitis, Smoking ongoing, COPD, depression, HIV, headache, hypertension- is feeling right-sided chest pain and shortness of breath since yesterday and also have brown colored sputum production. She denies any fever but she had chills and sweating. Noted results of multifocal pneumonia per chest CT. Pt stated she has "acid reflux" and that her ENT(Dr. Richardson Landry) just recently changed her medications so that she takes one at night - she thought this was her Reflux medication. She stated she has had "sore throat like this before". Pt stated she eats a regular diet at home w/ no s/s of aspiration. Currently, she c/o odynophagia. NSG denied any overt difficulty swallowing being noted during admission.    Assessment / Plan / Recommendation Clinical Impression  Pt appears at reduced risk for aspiration w/ oral intake when following general aspiration precautions; pt consumed po trials of thin liquids and solids w/ no overt s/s of aspiration noted during quick intake. Pt fed self adequately; reminders given to encourage pt to sit more upright during oral intake. No oral phase deficits apparent w/ bolus management. Pt does c/o Odynophagia during oral intake - she stated her "sore throat" began "a few days ago" and has happened  before. She endorsed s/s of REFLUX and did state that her throat felt like it was "burning" - effects of Reflux? She stated her ENT recently "changed my medications so I take it at night" - the PPI(?).  Recommendation to continue w/ current diet; use of popcicles. Education given on the impact of the Esophageal phase on swallowing overall; odynophagia and not to use throat numbing sprays BEFORE drinking/eating. Education also given on general aspiration and Reflux precautions. Discussed  recommendations of possible consult to ENT for direct viewing of the throat(c/o issues) as well as MD to r/o Thrush. NSG updated on evaluation and recommendations; pt agreed. No further skilled ST Services indicated at this time; NSG to reconsult if any oropharyngeal phase swallowing deficits occur. Recommend GI f/u d/t risk for aspiration of REFLUX which could impact her Pulmonary status. SLP Visit Diagnosis: Dysphagia, pharyngoesophageal phase (R13.14)    Aspiration Risk   (reduced following precautions)    Diet Recommendation  continue current diet - Regular, thin liquids; general aspiration precautions; Reflux precautions  Medication Administration: Whole meds with liquid    Other  Recommendations Recommended Consults: Consider GI evaluation (to assess Reflux/GERD; management of) Oral Care Recommendations: Oral care BID;Patient independent with oral care   Follow up Recommendations None      Frequency and Duration  n/a         Prognosis Prognosis for Safe Diet Advancement: Good      Swallow Study   General Date of Onset: 11/30/16 HPI: Pt is a 56 y.o. female with a known history of GERD per pt report(on PPI?), Arthritis, parathesia of hands bilateral, Asthma, bronchitis, Smoking ongoing, COPD, depression, HIV, headache, hypertension- is feeling right-sided chest pain and shortness of breath since yesterday and also have brown colored sputum production. She denies any fever but she had chills and sweating. Noted results of multifocal pneumonia per chest CT. Pt stated she has "acid reflux" and that her ENT(Dr. Richardson Landry) just recently changed her medications so that she takes one at night - she thought this was her Reflux medication. She stated she has had "sore throat like this before". Pt stated she eats a regular diet at home w/ no s/s of aspiration. Currently, she c/o odynophagia. NSG denied any overt difficulty swallowing being noted during admission.  Type of Study: Bedside Swallow  Evaluation Previous Swallow Assessment: none reported Diet Prior to this Study: Regular;Thin liquids Temperature Spikes Noted: No (wbc elevated) Respiratory Status: Nasal cannula (4 liters) History of Recent Intubation: No Behavior/Cognition: Alert;Cooperative;Pleasant mood Oral Cavity Assessment: Within Functional Limits Oral Care Completed by SLP: Recent completion by staff Oral Cavity - Dentition: Adequate natural dentition Vision: Functional for self-feeding Self-Feeding Abilities: Able to feed self Patient Positioning: Upright in bed (encouraged pt to sit up more) Baseline Vocal Quality: Normal (min gravely) Volitional Cough: Strong Volitional Swallow: Able to elicit    Oral/Motor/Sensory Function Overall Oral Motor/Sensory Function: Within functional limits   Ice Chips Ice chips: Not tested   Thin Liquid Thin Liquid: Within functional limits Presentation: Self Fed;Straw (several sips via straw)    Nectar Thick Nectar Thick Liquid: Not tested   Honey Thick Honey Thick Liquid: Not tested   Puree Puree: Not tested   Solid   GO   Solid: Within functional limits Presentation: Self Fed;Spoon (several pieces of seafood from box)         Orinda Kenner, MS, CCC-SLP Davan Hark 11/30/2016,2:27 PM

## 2016-11-30 NOTE — Progress Notes (Signed)
[facility] NAME: Sema Stangler    MR#:  [id number]  DATE OF BIRTH:  23-Feb-1961  SUBJECTIVE:   Patient Still with shortness of breath. She thinks she is spitting up   REVIEW OF SYSTEMS:    Review of Systems  Constitutional: Negative.  Negative for chills, fever and malaise/fatigue.  HENT: Negative.  Negative for ear discharge, ear pain, hearing loss, nosebleeds and sore throat.   Eyes: Negative.  Negative for blurred vision and pain.  Respiratory: Positive for cough and shortness of breath. Negative for hemoptysis and wheezing.   Cardiovascular: Negative.  Negative for chest pain, palpitations and leg swelling.  Gastrointestinal: Negative.  Negative for abdominal pain, blood in stool, diarrhea, nausea and vomiting.  Genitourinary: Negative.  Negative for dysuria.  Musculoskeletal: Negative.  Negative for back pain.  Skin: Negative.   Neurological: Negative for dizziness, tremors, speech change, focal weakness, seizures and headaches.  Endo/Heme/Allergies: Negative.  Does not bruise/bleed easily.  Psychiatric/Behavioral: Negative for depression, hallucinations and suicidal ideas. The patient is nervous/anxious.     Tolerating Diet:yes      DRUG ALLERGIES:   Allergies  Allergen Reactions  . Morphine And Related Itching  . Gabapentin Rash  . Zanaflex  [Tizanidine] Rash    VITALS:  Blood pressure 102/61, pulse 99, temperature 98.6 F (37 C), temperature source Oral, resp. rate 17, height 5\' 6"  (1.676 m), weight 127 kg (280 lb), SpO2 97 %.  PHYSICAL EXAMINATION:   Physical Exam  Constitutional: She is oriented to person, place, and time. No distress.  Overweight  HENT:  Head: Normocephalic.  Eyes: No scleral icterus.  Neck: Normal range of motion. Neck supple. No JVD present. No tracheal deviation present.  Cardiovascular: Normal rate, regular rhythm and normal heart sounds.  Exam reveals no gallop and no friction  rub.   No murmur heard. Pulmonary/Chest: Effort normal and breath sounds normal. No respiratory distress. She has no wheezes. She has no rales. She exhibits no tenderness.  Decreased breath sounds right base  Abdominal: Soft. Bowel sounds are normal. She exhibits no distension and no mass. There is no tenderness. There is no rebound and no guarding.  Musculoskeletal: Normal range of motion. She exhibits no edema.  Neurological: She is alert and oriented to person, place, and time.  Skin: Skin is warm. No rash noted. No erythema.  Psychiatric: Affect and judgment normal.      LABORATORY PANEL:   CBC  Recent Labs Lab 11/30/16 0335  WBC 25.7*  HGB 8.9*  HCT 27.3*  PLT 362   ------------------------------------------------------------------------------------------------------------------  Chemistries   Recent Labs Lab 11/28/16 1202  11/30/16 0335  NA 133*  < > 130*  K 3.2*  < > 3.9  CL 103  < > 103  CO2 21*  < > 19*  GLUCOSE 108*  < > 102*  BUN 22*  < > 46*  CREATININE 1.36*  < > 2.17*  CALCIUM 7.6*  < > 7.9*  AST 24  --   --   ALT 13*  --   --   ALKPHOS 59  --   --   BILITOT 0.9  --   --   < > = values in this interval not displayed. ------------------------------------------------------------------------------------------------------------------  Cardiac Enzymes No results for input(s): TROPONINI in the last 168 hours. ------------------------------------------------------------------------------------------------------------------  RADIOLOGY:  Ct Chest Wo Contrast  Result Date: 11/29/2016 CLINICAL DATA:  Pneumonia. Progressive chest pain, cough, and shortness of breath. EXAM:  CT CHEST WITHOUT CONTRAST TECHNIQUE: Multidetector CT imaging of the chest was performed following the standard protocol without IV contrast. COMPARISON:  Chest x-ray dated 11/28/2016 and CT scan dated 06/27/2015 FINDINGS: Cardiovascular: Aortic and coronary atherosclerosis. Overall heart  size is normal. No pericardial effusion. Mediastinum/Nodes: No enlarged mediastinal or axillary lymph nodes. Thyroid gland, trachea, and esophagus demonstrate no significant findings. Lungs/Pleura: There is a dense consolidative infiltrate of the majority of left lower lobe. There is a small patchy cavitary infiltrate in the lateral aspect of the left upper lobe. There small patchy areas of infiltrate in the left lower lobe posterior medially. No effusions. Upper Abdomen: Normal. Musculoskeletal: No chest wall mass or suspicious bone lesions identified. IMPRESSION: Multifocal pneumonia. Dense extensive consolidation in the right lower lobe. Cavitary patchy infiltrate in the left upper lobe. Patchy infiltrate in the left lower lobe. Electronically Signed   By: Lorriane Shire M.D.   On: 11/29/2016 09:47   US Renal  Result Date: 11/30/2016 CLINICAL DATA:  Acute renal failure EXAM: RENAL / URINARY TRACT ULTRASOUND COMPLETE COMPARISON:  None in PACs FINDINGS: Right Kidney: Length: 11.8 cm. The renal cortical echotexture remains lower than that of the adjacent liver. There is no hydronephrosis. Left Kidney: Length: 11.2 cm. The renal cortical echotexture is similar to that on the right. There is no hydronephrosis. Bladder: Appears normal for degree of bladder distention. IMPRESSION: Normal renal ultrasound examination. Electronically Signed   By: David  Martinique M.D.   On: 11/30/2016 10:39     ASSESSMENT AND PLAN:   56 year old female with well-controlled HIV with undetectable viral load 3 months ago who presented with shortness of breath and cough and found to have dense right lower lobe consolidation.   1. Acute hypoxic respiratory failure in the setting of dense right lower lobe consolidation Wean oxygen as tolerated Noncontrast CT shows Multifocal pneumonia. Dense extensive consolidation in the right lower lobe. Cavitary patchy infiltrate in the left upper lobe. Patchy infiltrate in the left lower  lobe  Pulmonary consultation requested..will call and speak with them today   2. Multifocal pneumonia: Appreciate ID consult. Continue Zosyn and add Vanc for MRSA in nares.. Request speech consultation as patient reporting perhaps spitting up chunks of meat Will discuss case with pulmonary patient main require bronchoscopy. Follow up on sputum culture  3.   Well controlled HIV:  Appreciate ID consult Continue HIV medications  4. Acute kidney injury in the setting of hypoxia and pneumonia Appreciate nephrology consultation Follow up on ankle antibodies and GBM antibodies in the setting of cavitary pneumonia Continue IV fluids  5. Hyponatremia in the setting of poor by mouth and multifocal pneumonia Continue IV fluids  6. Tobacco dependence: Patient is encouraged to quit smoking. Counseling was provided for 4 minutes. 7. MILD intermittant Asthma without exacerbation Continue  DUONEBS  8. hypokalmia: Repleted  9. Anemia chronic disease : hemoglobin stable WILL monitor for signs of acute bleeding   Management plans discussed with the patient and she is in agreement.  CODE STATUS: FULL  TOTAL TIME TAKING CARE OF THIS PATIENT: 27 minutes.     POSSIBLE D/C 2-3 days, DEPENDING ON CLINICAL CONDITION.   Kaliegh Willadsen M.D on 11/30/2016 at 11:35 AM  Between 7am to 6pm - Pager - [phone] After 6pm go to [email or web address] - password [email or web address] [facility]  Office  [phone]  CC: Primary care physician; [facility]  Note: This dictation was prepared with Dragon dictation along with smaller phrase technology.  Any transcriptional errors that result from this process are unintentional.

## 2016-11-30 NOTE — Telephone Encounter (Signed)
Received message that Dr. Raul Del is wanting [place] to do a bronch on this pt. She is currently admitted in to room 147. She has been a [facility] pt and she seen him last 05-27-16. Do you want to see this pt in the hospital or have her to come in for an OV?

## 2016-12-01 ENCOUNTER — Other Ambulatory Visit: Payer: Self-pay | Admitting: *Deleted

## 2016-12-01 ENCOUNTER — Telehealth: Payer: Self-pay

## 2016-12-01 DIAGNOSIS — J189 Pneumonia, unspecified organism: Secondary | ICD-10-CM

## 2016-12-01 LAB — BASIC METABOLIC PANEL
Anion gap: 6 (ref 5–15)
BUN: 22 mg/dL — ABNORMAL HIGH (ref 6–20)
CO2: 19 mmol/L — ABNORMAL LOW (ref 22–32)
Calcium: 8.7 mg/dL — ABNORMAL LOW (ref 8.9–10.3)
Chloride: 111 mmol/L (ref 101–111)
Creatinine, Ser: 0.71 mg/dL (ref 0.44–1.00)
GFR calc Af Amer: >60 mL/min (ref >60)
GFR calc non Af Amer: >60 mL/min (ref >60)
Glucose, Bld: 181 mg/dL — ABNORMAL HIGH (ref 65–99)
Potassium: 4.5 mmol/L (ref 3.5–5.1)
Sodium: 136 mmol/L (ref 135–145)

## 2016-12-01 LAB — CBC
HCT: 29.3 % — ABNORMAL LOW (ref 35.0–47.0)
Hemoglobin: 9.5 g/dL — ABNORMAL LOW (ref 12.0–16.0)
MCH: 25.7 pg — ABNORMAL LOW (ref 26.0–34.0)
MCHC: 32.3 g/dL (ref 32.0–36.0)
MCV: 79.5 fL — ABNORMAL LOW (ref 80.0–100.0)
Platelets: 384 10*3/uL (ref 150–440)
RBC: 3.69 MIL/uL — ABNORMAL LOW (ref 3.80–5.20)
RDW: 17.4 % — ABNORMAL HIGH (ref 11.5–14.5)
WBC: 16.8 10*3/uL — ABNORMAL HIGH (ref 3.6–11.0)

## 2016-12-01 LAB — STREP PNEUMONIAE URINARY ANTIGEN: Strep Pneumo Urinary Antigen: NEGATIVE

## 2016-12-01 MED ORDER — AMOXICILLIN-POT CLAVULANATE 875-125 MG PO TABS: 1.0000 | ORAL_TABLET | Freq: Two times a day (BID) | ORAL | 0 refills | Status: DC

## 2016-12-01 MED ORDER — PIPERACILLIN-TAZOBACTAM 3.375 G IVPB
3.3750 g | Freq: Three times a day (TID) | INTRAVENOUS | Status: DC
  Filled 2016-12-01 (×3): qty 50

## 2016-12-01 MED ORDER — POLYETHYLENE GLYCOL 3350 17 G PO PACK: 17.0000 g | PACK | Freq: Every day | ORAL | 0 refills | Status: DC

## 2016-12-01 MED ORDER — MAGIC MOUTHWASH
5.0000 mL | Freq: Four times a day (QID) | ORAL | 0 refills | Status: DC | PRN
Start: 2016-12-01 — End: 2017-11-03

## 2016-12-01 MED ORDER — VANCOMYCIN HCL IN DEXTROSE 1-5 GM/200ML-% IV SOLN
1000.0000 mg | Freq: Three times a day (TID) | INTRAVENOUS | Status: DC
  Administered 2016-12-01: 1000 mg via INTRAVENOUS
  Filled 2016-12-01 (×3): qty 200

## 2016-12-01 MED ORDER — PREDNISONE 10 MG (21) PO TBPK: ORAL_TABLET | ORAL | 0 refills | Status: DC

## 2016-12-01 MED ORDER — POLYETHYLENE GLYCOL 3350 17 G PO PACK: 17.0000 g | PACK | Freq: Every day | ORAL | Status: DC

## 2016-12-01 NOTE — Telephone Encounter (Signed)
L MOM to call back and schedule f/u appt

## 2016-12-01 NOTE — Telephone Encounter (Signed)
-----   Message from [facility] [place] sent at 03/08/1503  7:59 AM EDT ----- Please schedule as directed as below in a 30 minute slot and inform pt to get CXR 1 hour prior to [facility]. Order for CXR placed.  Thanks,  Misty ----- Message ----- From: Wilhelmina Mcardle, MD Sent: 11/30/2016   6:39 PM To: Renelda Mom, LPN  Please schedule follow-up with me in 4-6 weeks with chest x-ray prior  Thanks  Waunita Schooner

## 2016-12-01 NOTE — Care Management (Signed)
Order for home O2. Patient did not qualify. Patient updated

## 2016-12-01 NOTE — Progress Notes (Signed)
SATURATION QUALIFICATIONS: (This note is used to comply with regulatory documentation for home oxygen)  Patient Saturations on Room Air at Rest = %  Patient Saturations on Room Air while Ambulating = 94%  Patient Saturations on  Liters of oxygen while Ambulating = %  Please briefly explain why patient needs home oxygen: O2 sat was completed by Jenny Reichmann, Therapist, sports.

## 2016-12-01 NOTE — Discharge Instructions (Signed)
Community-Acquired Pneumonia, Adult °Pneumonia is an infection of the lungs. One type of pneumonia can happen while a person is in a hospital. A different type can happen when a person is not in a hospital (community-acquired pneumonia). It is easy for this kind to spread from person to person. It can spread to you if you breathe near an infected person who coughs or sneezes. Some symptoms include: °· A dry cough. °· A wet (productive) cough. °· Fever. °· Sweating. °· Chest pain. °Follow these instructions at home: °· Take over-the-counter and prescription medicines only as told by your doctor. °¨ Only take cough medicine if you are losing sleep. °¨ If you were prescribed an antibiotic medicine, take it as told by your doctor. Do not stop taking the antibiotic even if you start to feel better. °· Sleep with your head and neck raised (elevated). You can do this by putting a few pillows under your head, or you can sleep in a recliner. °· Do not use tobacco products. These include cigarettes, chewing tobacco, and e-cigarettes. If you need help quitting, ask your doctor. °· Drink enough water to keep your pee (urine) clear or pale yellow. °A shot (vaccine) can help prevent pneumonia. Shots are often suggested for: °· People older than 55 years of age. °· People older than 55 years of age: °¨ Who are having cancer treatment. °¨ Who have long-term (chronic) lung disease. °¨ Who have problems with their body's defense system (immune system). °You may also prevent pneumonia if you take these actions: °· Get the flu (influenza) shot every year. °· Go to the dentist as often as told. °· Wash your hands often. If soap and water are not available, use hand sanitizer. °Contact a doctor if: °· You have a fever. °· You lose sleep because your cough medicine does not help. °Get help right away if: °· You are short of breath and it gets worse. °· You have more chest pain. °· Your sickness gets worse. This is very serious if: °¨ You  are an older adult. °¨ Your body's defense system is weak. °· You cough up blood. °This information is not intended to replace advice given to you by your health care provider. Make sure you discuss any questions you have with your health care provider. °Document Released: 02/02/2008 Document Revised: 01/22/2016 Document Reviewed: 12/11/2014 °Elsevier Interactive Patient Education © 2017 Elsevier Inc. ° °

## 2016-12-01 NOTE — Progress Notes (Signed)
Pharmacy Antibiotic Note  Melanie Bowen is a 56 y.o. female admitted on 11/28/2016 with PNA.  Pharmacy has been consulted for Zosyn dosing and now has been consulted to dose Vancomycin as well.   Plan: Serum creatinine has now improved significantly.   Will start Vancomycin 1 g IV q8 hours. Will check trough level prior to the 1630 dose on 4/5.   Zosyn: Will change to Zosyn 3.375g IV q8 hours.    Height: 5\' 6"  (167.6 cm) Weight: 280 lb (127 kg) IBW/kg (Calculated) : 59.3  Temp (24hrs), Avg:98.1 F (36.7 C), Min:97.8 F (36.6 C), Max:98.4 F (36.9 C)   Recent Labs Lab 11/28/16 1202 11/29/16 0333 11/29/16 0713 11/30/16 0335 12/01/16 0413  WBC 21.6* 28.2* 30.3* 25.7* 16.8*  CREATININE 1.36* 2.02*  --  2.17* 0.71    Estimated Creatinine Clearance: 109.7 mL/min (by C-G formula based on SCr of 0.71 mg/dL).    Allergies  Allergen Reactions  . Morphine And Related Itching  . Gabapentin Rash  . Zanaflex  [Tizanidine] Rash    Thank you for allowing pharmacy to be a part of this patient's care.  Woodie Degraffenreid D, Pharm.D., BCPS Clinical Pharmacist 12/01/2016 8:10 AM

## 2016-12-01 NOTE — Telephone Encounter (Signed)
I saw pt in consultation 04/03  Melanie Bowen

## 2016-12-01 NOTE — Progress Notes (Signed)
Renal function now normal, therefore will sign off.

## 2016-12-01 NOTE — Progress Notes (Signed)
Patient was discharged home via wheelchair. Jenny Reichmann, RN reviewed discharge instructions and removed IV. Written script sent with patient.

## 2016-12-02 ENCOUNTER — Telehealth: Payer: Self-pay | Admitting: Infectious Diseases

## 2016-12-02 LAB — CULTURE, RESPIRATORY W GRAM STAIN

## 2016-12-02 LAB — CULTURE, RESPIRATORY: Special Requests: NORMAL

## 2016-12-02 LAB — LEGIONELLA PNEUMOPHILA SEROGP 1 UR AG: L. pneumophila Serogp 1 Ur Ag: NEGATIVE

## 2016-12-02 NOTE — Telephone Encounter (Signed)
I spoke to pt regarding the sputum cx which came back MRSA- was dced on Augmentin  I have sent in bactrim DS bid for 21 days  [facility]

## 2016-12-02 NOTE — Discharge Summary (Signed)
[facility] [place] NAME: Melanie Bowen    MR#:  [id number]  DATE OF BIRTH:  1961/03/28  DATE OF ADMISSION:  11/28/2016   ADMITTING PHYSICIAN: Vaughan Basta, MD  DATE OF DISCHARGE: 12/01/2016  9:50 AM  PRIMARY CARE PHYSICIAN: [facility]   ADMISSION DIAGNOSIS:  HIV (human immunodeficiency virus infection) ([place]) [B20] Community acquired pneumonia of right lower lobe of lung ([place]) [J18.1] DISCHARGE DIAGNOSIS:  Principal Problem:   Community acquired pneumonia Active Problems:   Pneumonia  SECONDARY DIAGNOSIS:   Past Medical History:  Diagnosis Date  . Allergic rhinitis   . Arthritis    DDD  . Asthma   . Blepharitis   . Bronchitis   . Cigarette smoker   . Claustrophobia   . COPD (chronic obstructive pulmonary disease) ([place])   . Cough   . Depression   . Eye irritation   . Genital herpes   . GERD (gastroesophageal reflux disease)   . Headache   . HIV (human immunodeficiency virus infection) ([place])   . Hypertension   . Paresthesia of hand, bilateral   . Shortness of breath dyspnea    at times due to asthma   HOSPITAL COURSE:  56 year old female with well-controlled HIV with undetectable viral load 3 months ago who presented with shortness of breath and cough and found to have dense right lower lobe consolidation.  1. Acute hypoxic respiratory failure in the setting of dense right lower lobe and multifocal pneumonia 2. Multifocal pneumonia: improved with Abx, Sputum c/s + for MRSA, initially DCed on Augmentin but due to MRSA growing in sputum - Dr [name] has called in Bactrim for 21 days 3. Well controlled HIV: Continue HIV medications 4. Acute kidney injury in the setting of hypoxia and pneumonia: resolved with hydration, Prerenal etio 5. Hyponatremia in the setting of poor by mouth and multifocal pneumonia: improved with IVFs 6. Tobacco dependence: Patient is encouraged to quit  smoking. Counseling was provided for 4 minutes. 7. MILD intermittant Asthma without exacerbation 8. hypokalmia: Repleted 9. Anemia chronic disease : hemoglobin stable DISCHARGE CONDITIONS:  Stable - patient didn't qualify for O2 on discharge. CONSULTS OBTAINED:  Treatment Team:  Anthonette Legato, MD Leonel Ramsay, MD DRUG ALLERGIES:   Allergies  Allergen Reactions  . Morphine And Related Itching  . Gabapentin Rash  . Zanaflex  [Tizanidine] Rash   DISCHARGE MEDICATIONS:   Allergies as of 12/01/2016      Reactions   Morphine And Related Itching   Gabapentin Rash   Zanaflex  [tizanidine] Rash      Medication List    STOP taking these medications   amoxicillin 500 MG tablet Commonly known as:  AMOXIL     TAKE these medications   albuterol 108 (90 Base) MCG/ACT inhaler Commonly known as:  PROVENTIL HFA;VENTOLIN HFA Inhale 2 puffs into the lungs every 6 (six) hours as needed for wheezing or shortness of breath.   amLODipine 10 MG tablet Commonly known as:  NORVASC Take 10 mg by mouth daily.   amoxicillin-clavulanate 875-125 MG tablet Commonly known as:  AUGMENTIN Take 1 tablet by mouth 2 (two) times daily.   dicyclomine 10 MG capsule Commonly known as:  BENTYL Take 1 capsule (10 mg total) by mouth 3 (three) times daily as needed (Abdominal cramping).   efavirenz-emtricitabine-tenofovir 600-200-300 MG tablet Commonly known as:  ATRIPLA Take 1 tablet by mouth at bedtime.   Fluticasone-Salmeterol 250-50 MCG/DOSE Aepb Commonly known as:  ADVAIR Inhale 1 puff into the lungs 2 (two) times daily.   Fluticasone-Salmeterol 500-50 MCG/DOSE Aepb Commonly known as:  ADVAIR DISKUS Inhale 1 puff into the lungs 2 (two) times daily.   GENVOYA 150-150-200-10 MG Tabs tablet Generic drug:  elvitegravir-cobicistat-emtricitabine-tenofovir Take 1 tablet by mouth daily.   guaiFENesin-codeine 100-10 MG/5ML syrup Take 10 mLs by mouth 3 (three) times daily as needed.     hydrochlorothiazide 25 MG tablet Commonly known as:  HYDRODIURIL Take 25 mg by mouth daily.   ipratropium-albuterol 0.5-2.5 (3) MG/3ML Soln Commonly known as:  DUONEB Inhale 3 mLs into the lungs every 6 (six) hours as needed (for wheezing/shortness of breath).   losartan 50 MG tablet Commonly known as:  COZAAR Take 50 mg by mouth daily.   magic mouthwash Soln Take 5 mLs by mouth 4 (four) times daily as needed for mouth pain.   mupirocin ointment 2 % Commonly known as:  BACTROBAN Apply 1 application topically 2 (two) times daily as needed (for rash).   omeprazole 40 MG capsule Commonly known as:  PRILOSEC Take 40 mg by mouth daily.   oxyCODONE 15 MG immediate release tablet Commonly known as:  ROXICODONE Take 15 mg by mouth every 6 (six) hours as needed for pain.   polyethylene glycol packet Commonly known as:  MIRALAX / GLYCOLAX Take 17 g by mouth daily.   predniSONE 10 MG (21) Tbpk tablet Commonly known as:  STERAPRED UNI-PAK 21 TAB Start 60 mg po daily, taper 10 mg daily until done What changed:  additional instructions   promethazine 25 MG tablet Commonly known as:  PHENERGAN Take 25 mg by mouth every 6 (six) hours as needed for nausea or vomiting.   valACYclovir 500 MG tablet Commonly known as:  VALTREX Take 500 mg by mouth 2 (two) times daily.   valACYclovir 500 MG tablet Commonly known as:  VALTREX Take 500 mg by mouth 2 (two) times daily.        DISCHARGE INSTRUCTIONS:   DIET:  Regular diet DISCHARGE CONDITION:  Good ACTIVITY:  Activity as tolerated OXYGEN:  Home Oxygen: No.  Oxygen Delivery: room air DISCHARGE LOCATION:  home   If you experience worsening of your admission symptoms, develop shortness of breath, life threatening emergency, suicidal or homicidal thoughts you must seek medical attention immediately by calling 911 or calling your MD immediately  if symptoms less severe.  You Must read complete instructions/literature along  with all the possible adverse reactions/side effects for all the Medicines you take and that have been prescribed to you. Take any new Medicines after you have completely understood and accpet all the possible adverse reactions/side effects.   Please note  You were cared for by a hospitalist during your hospital stay. If you have any questions about your discharge medications or the care you received while you were in the hospital after you are discharged, you can call the unit and asked to speak with the hospitalist on call if the hospitalist that took care of you is not available. Once you are discharged, your primary care physician will handle any further medical issues. Please note that NO REFILLS for any discharge medications will be authorized once you are discharged, as it is imperative that you return to your primary care physician (or establish a relationship with a primary care physician if you do not have one) for your aftercare needs so that they can reassess your need for medications and monitor your lab values.    On the  day of Discharge:  VITAL SIGNS:  Blood pressure 128/60, pulse 82, temperature 97.7 F (36.5 C), temperature source Oral, resp. rate 18, height 5\' 6"  (1.676 m), weight 127 kg (280 lb), SpO2 96 %. PHYSICAL EXAMINATION:  GENERAL:  56 y.o.-year-old patient lying in the bed with no acute distress.  EYES: Pupils equal, round, reactive to light and accommodation. No scleral icterus. Extraocular muscles intact.  HEENT: Head atraumatic, normocephalic. Oropharynx and nasopharynx clear.  NECK:  Supple, no jugular venous distention. No thyroid enlargement, no tenderness.  LUNGS: Normal breath sounds bilaterally, no wheezing, rales,rhonchi or crepitation. No use of accessory muscles of respiration.  CARDIOVASCULAR: S1, S2 normal. No murmurs, rubs, or gallops.  ABDOMEN: Soft, non-tender, non-distended. Bowel sounds present. No organomegaly or mass.  EXTREMITIES: No pedal edema,  cyanosis, or clubbing.  NEUROLOGIC: Cranial nerves II through XII are intact. Muscle strength 5/5 in all extremities. Sensation intact. Gait not checked.  PSYCHIATRIC: The patient is alert and oriented x 3.  SKIN: No obvious rash, lesion, or ulcer.  DATA REVIEW:   CBC  Recent Labs Lab 12/01/16 0413  WBC 16.8*  HGB 9.5*  HCT 29.3*  PLT 384    Chemistries   Recent Labs Lab 11/28/16 1202  12/01/16 0413  NA 133*  < > 136  K 3.2*  < > 4.5  CL 103  < > 111  CO2 21*  < > 19*  GLUCOSE 108*  < > 181*  BUN 22*  < > 22*  CREATININE 1.36*  < > 0.71  CALCIUM 7.6*  < > 8.7*  AST 24  --   --   ALT 13*  --   --   ALKPHOS 59  --   --   BILITOT 0.9  --   --   < > = values in this interval not displayed.   Microbiology Results  Sputum c/s returned positive for MRSA - patient was DCed on Augmentin which has been switched to bactrim by Dr [name] on 4/5 for 21 days supply.  Management plans discussed with the patient, family and they are in agreement.  CODE STATUS: Prior   TOTAL TIME TAKING CARE OF THIS PATIENT: 45 minutes.    Max Sane M.D on 12/02/2016 at 8:22 PM  Between 7am to 6pm - Pager - [phone]  After 6pm go to [email or web address] - Proofreader  [facility]  Office  [phone]  CC: Primary care physician; [facility]   Note: This dictation was prepared with Dragon dictation along with smaller phrase technology. Any transcriptional errors that result from this process are unintentional.

## 2016-12-03 ENCOUNTER — Encounter: Payer: Self-pay | Admitting: Emergency Medicine

## 2016-12-03 ENCOUNTER — Emergency Department: Payer: Medicare Other

## 2016-12-03 ENCOUNTER — Emergency Department
Admission: EM | Admit: 2016-12-03 | Discharge: 2016-12-03 | Disposition: A | Discharge status: Home / Self Care | Payer: Medicare Other | Attending: Student in an Organized Health Care Education/Training Program | Admitting: Student in an Organized Health Care Education/Training Program

## 2016-12-03 DIAGNOSIS — Z79899 Other long term (current) drug therapy: Secondary | ICD-10-CM | POA: Insufficient documentation

## 2016-12-03 DIAGNOSIS — I10 Essential (primary) hypertension: Secondary | ICD-10-CM | POA: Diagnosis not present

## 2016-12-03 DIAGNOSIS — F17210 Nicotine dependence, cigarettes, uncomplicated: Secondary | ICD-10-CM | POA: Insufficient documentation

## 2016-12-03 DIAGNOSIS — J45909 Unspecified asthma, uncomplicated: Secondary | ICD-10-CM | POA: Insufficient documentation

## 2016-12-03 DIAGNOSIS — J449 Chronic obstructive pulmonary disease, unspecified: Secondary | ICD-10-CM | POA: Insufficient documentation

## 2016-12-03 DIAGNOSIS — J181 Lobar pneumonia, unspecified organism: Secondary | ICD-10-CM | POA: Insufficient documentation

## 2016-12-03 DIAGNOSIS — Z21 Asymptomatic human immunodeficiency virus [HIV] infection status: Secondary | ICD-10-CM | POA: Insufficient documentation

## 2016-12-03 DIAGNOSIS — R0602 Shortness of breath: Secondary | ICD-10-CM | POA: Diagnosis present

## 2016-12-03 DIAGNOSIS — J189 Pneumonia, unspecified organism: Secondary | ICD-10-CM

## 2016-12-03 LAB — CBC WITH DIFFERENTIAL/PLATELET
Band Neutrophils: 2 %
Basophils Absolute: 0 K/uL (ref 0–0.1)
Basophils Relative: 0 %
Blasts: 0 %
Eosinophils Absolute: 0 K/uL (ref 0–0.7)
Eosinophils Relative: 0 %
HCT: 29.8 % — ABNORMAL LOW (ref 35.0–47.0)
Hemoglobin: 9.9 g/dL — ABNORMAL LOW (ref 12.0–16.0)
Lymphocytes Relative: 12 %
Lymphs Abs: 2.7 K/uL (ref 1.0–3.6)
MCH: 25.9 pg — ABNORMAL LOW (ref 26.0–34.0)
MCHC: 33.1 g/dL (ref 32.0–36.0)
MCV: 78.4 fL — ABNORMAL LOW (ref 80.0–100.0)
Metamyelocytes Relative: 0 %
Monocytes Absolute: 1.6 K/uL — ABNORMAL HIGH (ref 0.2–0.9)
Monocytes Relative: 7 %
Myelocytes: 0 %
Neutro Abs: 18.3 K/uL — ABNORMAL HIGH (ref 1.4–6.5)
Neutrophils Relative %: 79 %
Other: 0 %
Platelets: 452 K/uL — ABNORMAL HIGH (ref 150–440)
Promyelocytes Absolute: 0 %
RBC: 3.8 MIL/uL (ref 3.80–5.20)
RDW: 17.5 % — ABNORMAL HIGH (ref 11.5–14.5)
WBC: 22.6 K/uL — ABNORMAL HIGH (ref 3.6–11.0)
nRBC: 2 /100{WBCs} — ABNORMAL HIGH

## 2016-12-03 LAB — COMPREHENSIVE METABOLIC PANEL WITH GFR
ALT: 31 U/L (ref 14–54)
AST: 38 U/L (ref 15–41)
Albumin: 2.7 g/dL — ABNORMAL LOW (ref 3.5–5.0)
Alkaline Phosphatase: 67 U/L (ref 38–126)
Anion gap: 10 (ref 5–15)
BUN: 20 mg/dL (ref 6–20)
CO2: 21 mmol/L — ABNORMAL LOW (ref 22–32)
Calcium: 8.6 mg/dL — ABNORMAL LOW (ref 8.9–10.3)
Chloride: 105 mmol/L (ref 101–111)
Creatinine, Ser: 1.15 mg/dL — ABNORMAL HIGH (ref 0.44–1.00)
GFR calc Af Amer: >60 mL/min
GFR calc non Af Amer: 53 mL/min — ABNORMAL LOW
Glucose, Bld: 123 mg/dL — ABNORMAL HIGH (ref 65–99)
Potassium: 3.5 mmol/L (ref 3.5–5.1)
Sodium: 136 mmol/L (ref 135–145)
Total Bilirubin: 0.4 mg/dL (ref 0.3–1.2)
Total Protein: 6.7 g/dL (ref 6.5–8.1)

## 2016-12-03 LAB — BRAIN NATRIURETIC PEPTIDE: B Natriuretic Peptide: 193 pg/mL — ABNORMAL HIGH (ref 0.0–100.0)

## 2016-12-03 LAB — TROPONIN I: Troponin I: <0.03 ng/mL (ref <0.03)

## 2016-12-03 NOTE — ED Notes (Signed)
Called respiratory for flutter valve.

## 2016-12-03 NOTE — ED Provider Notes (Signed)
[facility] Emergency Department Provider Note    First MD Initiated Contact with Patient 12/03/16 1842     (approximate)  I have reviewed the triage vital signs and the nursing notes.   HISTORY  Chief Complaint Shortness of Breath    HPI Melanie Bowen is a 56 y.o. female history of HIV and recent admission to hospital for MRSA pneumonia presents from urgent care due to concern for congestive heart failure. Patient states that she was discharged and has been having a very slow improvement from her recent admission. Has been having chills but no measured fevers. She went to urgent care today to establish care as her insurance R she needed a primary care physician. They did basic workup and a chest x-ray that initially was read as normal but she was called back and told to go to the ER because the chest x-ray was over read as showing signs of congestive heart failure.   Past Medical History:  Diagnosis Date  . Allergic rhinitis   . Arthritis    DDD  . Asthma   . Blepharitis   . Bronchitis   . Cigarette smoker   . Claustrophobia   . COPD (chronic obstructive pulmonary disease) ([place])   . Cough   . Depression   . Eye irritation   . Genital herpes   . GERD (gastroesophageal reflux disease)   . Headache   . HIV (human immunodeficiency virus infection) ([place])   . Hypertension   . Paresthesia of hand, bilateral   . Shortness of breath dyspnea    at times due to asthma   Family History  Problem Relation Age of Onset  . Breast cancer Sister    Past Surgical History:  Procedure Laterality Date  . BACK SURGERY    . back surgery may 2016     lumbar   . BUNIONECTOMY Bilateral    feet  . DIAGNOSTIC LAPAROSCOPY    . ESOPHAGOGASTRODUODENOSCOPY Left 01/11/2016   Procedure: ESOPHAGOGASTRODUODENOSCOPY (EGD);  Surgeon: Hulen Luster, MD;  Location: [facility] ENDOSCOPY;  Service: Endoscopy;  Laterality: Left;  . Finger fracture Right    5th finger  . HAND  SURGERY Bilateral    carpal tunnel  . JOINT REPLACEMENT Bilateral    knees  . KNEE SURGERY Bilateral 2003,2007   total Joint  . LAPAROSCOPY FOR ECTOPIC PREGNANCY    . PERIPHERAL VASCULAR CATHETERIZATION N/A 01/12/2016   Procedure: Visceral Angiography;  Surgeon: Algernon Huxley, MD;  Location: [facility];  Service: Cardiovascular;  Laterality: N/A;  . PERIPHERAL VASCULAR CATHETERIZATION N/A 01/12/2016   Procedure: Visceral Artery Intervention;  Surgeon: Algernon Huxley, MD;  Location: [facility];  Service: Cardiovascular;  Laterality: N/A;  . TUBAL LIGATION    . VIDEO BRONCHOSCOPY N/A 06/30/2015   Procedure: VIDEO BRONCHOSCOPY WITHOUT FLUORO;  Surgeon: Vilinda Boehringer, MD;  Location: [facility] ORS;  Service: Cardiopulmonary;  Laterality: N/A;   Patient Active Problem List   Diagnosis Date Noted  . Community acquired pneumonia 11/28/2016  . Pneumonia 11/28/2016  . Allergic rhinitis 05/27/2016  . Tobacco abuse counseling 02/11/2016  . Symptomatic anemia   . Lower GI bleed   . Anemia 01/16/2016  . GI bleed 01/10/2016  . GERD (gastroesophageal reflux disease) 01/10/2016  . HTN (hypertension) 01/10/2016  . Depression 01/10/2016  . HIV (human immunodeficiency virus infection) ([place]) 01/10/2016  . Lumbar pseudoarthrosis 10/31/2015  . Chronic cough   . Cough 06/19/2015  . DDD (degenerative disc  disease), lumbar 12/31/2014  . Degenerative disc disease, lumbar 12/31/2014  . Asthma, chronic 11/12/2014      Prior to Admission medications   Medication Sig Start Date End Date Taking? Authorizing Provider  albuterol (PROVENTIL HFA;VENTOLIN HFA) 108 (90 BASE) MCG/ACT inhaler Inhale 2 puffs into the lungs every 6 (six) hours as needed for wheezing or shortness of breath.     Historical Provider, MD  amLODipine (NORVASC) 10 MG tablet Take 10 mg by mouth daily.     Historical Provider, MD  amoxicillin-clavulanate (AUGMENTIN) 875-125 MG tablet Take 1 tablet by mouth 2 (two) times daily.  12/01/16   Max Sane, MD  dicyclomine (BENTYL) 10 MG capsule Take 1 capsule (10 mg total) by mouth 3 (three) times daily as needed (Abdominal cramping). Patient not taking: Reported on 11/28/2016 01/18/16   Max Sane, MD  efavirenz-emtricitabine-tenofovir (ATRIPLA) [phone] MG per tablet Take 1 tablet by mouth at bedtime.    Historical Provider, MD  Fluticasone-Salmeterol (ADVAIR DISKUS) 500-50 MCG/DOSE AEPB Inhale 1 puff into the lungs 2 (two) times daily. 05/27/16   Vishal Mungal, MD  Fluticasone-Salmeterol (ADVAIR) 250-50 MCG/DOSE AEPB Inhale 1 puff into the lungs 2 (two) times daily.    Historical Provider, MD  GENVOYA 150-150-200-10 MG TABS tablet Take 1 tablet by mouth daily. 11/09/16   Historical Provider, MD  guaiFENesin-codeine 100-10 MG/5ML syrup Take 10 mLs by mouth 3 (three) times daily as needed. 08/28/16   Victorino Dike, FNP  hydrochlorothiazide (HYDRODIURIL) 25 MG tablet Take 25 mg by mouth daily.    Historical Provider, MD  ipratropium-albuterol (DUONEB) 0.5-2.5 (3) MG/3ML SOLN Inhale 3 mLs into the lungs every 6 (six) hours as needed (for wheezing/shortness of breath).     Historical Provider, MD  losartan (COZAAR) 50 MG tablet Take 50 mg by mouth daily.    Historical Provider, MD  magic mouthwash SOLN Take 5 mLs by mouth 4 (four) times daily as needed for mouth pain. 12/01/16   Max Sane, MD  mupirocin ointment (BACTROBAN) 2 % Apply 1 application topically 2 (two) times daily as needed (for rash).     Historical Provider, MD  omeprazole (PRILOSEC) 40 MG capsule Take 40 mg by mouth daily.     Historical Provider, MD  oxyCODONE (ROXICODONE) 15 MG immediate release tablet Take 15 mg by mouth every 6 (six) hours as needed for pain.    Historical Provider, MD  polyethylene glycol (MIRALAX / GLYCOLAX) packet Take 17 g by mouth daily. 12/01/16   Max Sane, MD  predniSONE (STERAPRED UNI-PAK 21 TAB) 10 MG (21) TBPK tablet Start 60 mg po daily, taper 10 mg daily until done 12/01/16   Max Sane,  MD  promethazine (PHENERGAN) 25 MG tablet Take 25 mg by mouth every 6 (six) hours as needed for nausea or vomiting.     Historical Provider, MD  valACYclovir (VALTREX) 500 MG tablet Take 500 mg by mouth 2 (two) times daily.    Historical Provider, MD  valACYclovir (VALTREX) 500 MG tablet Take 500 mg by mouth 2 (two) times daily.    Historical Provider, MD    Allergies Morphine and related; Gabapentin; and Zanaflex  [tizanidine]    Social History Social History  Substance Use Topics  . Smoking status: Light Tobacco Smoker    Years: 38.00    Types: Cigarettes  . Smokeless tobacco: Never Used     Comment: 1 cig daily  . Alcohol use 3.0 oz/week    5 Glasses of wine per week  Comment: wine - couple days of week    Review of Systems Patient denies headaches, rhinorrhea, blurry vision, numbness, shortness of breath, chest pain, edema, cough, abdominal pain, nausea, vomiting, diarrhea, dysuria, fevers, rashes or hallucinations unless otherwise stated above in HPI. ____________________________________________   PHYSICAL EXAM:  VITAL SIGNS: Vitals:   12/03/16 1813 12/03/16 1815  BP: (!) 117/59   Pulse:    Resp:    Temp:  98.5 F (36.9 C)    Constitutional: Alert and oriented. Well appearing and in no acute distress. Eyes: Conjunctivae are normal. PERRL. EOMI. Head: Atraumatic. Nose: No congestion/rhinnorhea. Mouth/Throat: Mucous membranes are moist.  Oropharynx non-erythematous. Neck: No stridor. Painless ROM. No cervical spine tenderness to palpation Hematological/Lymphatic/Immunilogical: No cervical lymphadenopathy. Cardiovascular: Normal rate, regular rhythm. Grossly normal heart sounds.  Good peripheral circulation. Respiratory: Normal respiratory effort.  No retractions. Lungs with rhonchorus breathsounds in RLL Gastrointestinal: Soft and nontender. No distention. No abdominal bruits. No CVA tenderness. Genitourinary:  Musculoskeletal: No lower extremity tenderness  nor edema.  No joint effusions. Neurologic:  Normal speech and language. No gross focal neurologic deficits are appreciated. No gait instability. Skin:  Skin is warm, dry and intact. No rash noted. Psychiatric: Mood and affect are normal. Speech and behavior are normal.  ____________________________________________   LABS (all labs ordered are listed, but only abnormal results are displayed)  Results for orders placed or performed during the hospital encounter of 12/03/16 (from the past 24 hour(s))  CBC with Differential/Platelet     Status: Abnormal   Collection Time: 12/03/16  7:05 PM  Result Value Ref Range   WBC 22.6 (H) 3.6 - 11.0 K/uL   RBC 3.80 3.80 - 5.20 MIL/uL   Hemoglobin 9.9 (L) 12.0 - 16.0 g/dL   HCT 29.8 (L) 35.0 - 47.0 %   MCV 78.4 (L) 80.0 - 100.0 fL   MCH 25.9 (L) 26.0 - 34.0 pg   MCHC 33.1 32.0 - 36.0 g/dL   RDW 17.5 (H) 11.5 - 14.5 %   Platelets 452 (H) 150 - 440 K/uL   Neutrophils Relative % 79 %   Lymphocytes Relative 12 %   Monocytes Relative 7 %   Eosinophils Relative 0 %   Basophils Relative 0 %   Band Neutrophils 2 %   Metamyelocytes Relative 0 %   Myelocytes 0 %   Promyelocytes Absolute 0 %   Blasts 0 %   nRBC 2 (H) 0 /100 WBC   Other 0 %   Neutro Abs 18.3 (H) 1.4 - 6.5 K/uL   Lymphs Abs 2.7 1.0 - 3.6 K/uL   Monocytes Absolute 1.6 (H) 0.2 - 0.9 K/uL   Eosinophils Absolute 0.0 0 - 0.7 K/uL   Basophils Absolute 0.0 0 - 0.1 K/uL   RBC Morphology MIXED RBC POPULATION    WBC Morphology TOXIC GRANULATION    Smear Review LARGE PLATELETS PRESENT   Comprehensive metabolic panel     Status: Abnormal   Collection Time: 12/03/16  7:05 PM  Result Value Ref Range   Sodium 136 135 - 145 mmol/L   Potassium 3.5 3.5 - 5.1 mmol/L   Chloride 105 101 - 111 mmol/L   CO2 21 (L) 22 - 32 mmol/L   Glucose, Bld 123 (H) 65 - 99 mg/dL   BUN 20 6 - 20 mg/dL   Creatinine, Ser 1.15 (H) 0.44 - 1.00 mg/dL   Calcium 8.6 (L) 8.9 - 10.3 mg/dL   Total Protein 6.7 6.5 - 8.1  g/dL   Albumin  2.7 (L) 3.5 - 5.0 g/dL   AST 38 15 - 41 U/L   ALT 31 14 - 54 U/L   Alkaline Phosphatase 67 38 - 126 U/L   Total Bilirubin 0.4 0.3 - 1.2 mg/dL   GFR calc non Af Amer 53 (L) >60 mL/min   GFR calc Af Amer >60 >60 mL/min   Anion gap 10 5 - 15  Troponin I     Status: None   Collection Time: 12/03/16  7:05 PM  Result Value Ref Range   Troponin I <0.03 <0.03 ng/mL  Brain natriuretic peptide     Status: Abnormal   Collection Time: 12/03/16  7:05 PM  Result Value Ref Range   B Natriuretic Peptide 193.0 (H) 0.0 - 100.0 pg/mL   ____________________________________________  EKG My review and personal interpretation at Time: 18:16   Indication: cough  Rate: 95  Rhythm: sinus Axis: normal Other: no stemi, normal intervals ____________________________________________  RADIOLOGY  I personally reviewed all radiographic images ordered to evaluate for the above acute complaints and reviewed radiology reports and findings.  These findings were personally discussed with the patient.  Please see medical record for radiology report. _________________________________________   PROCEDURES  Procedure(s) performed:  Procedures    Critical Care performed: no ____________________________________________   INITIAL IMPRESSION / ASSESSMENT AND PLAN / ED COURSE  Pertinent labs & imaging results that were available during my care of the patient were reviewed by me and considered in my medical decision making (see chart for details).  DDX: pna, chf, ptx, copd  Melanie Bowen is a 56 y.o. who presents to the ED with HIV and recent admission for pneumonia presents at the direction of urgent care due to concern for congestive heart failure. She has no signs or symptoms of congestive heart failure. Presentation is best sprain with persistent pneumonia. Her chest x-ray shows interval improvement in consolidation. She does have a mildly elevated white count which may be secondary to  persistent inflammatory process and the patient is also on steroids. She is otherwise afebrile. Review of sputum culture does show that it was only MRSA was sensitive to Bactrim. I spoke with Dr. [name] regarding antibiotic recommendations and he recommended the patient continue on Bactrim. She is otherwise well-appearing and requesting discharge home. She is able to ambulate without any hypoxia or worsening shortness of breath. I do feel this is an appropriate option.  Have discussed with the patient and available family all diagnostics and treatments performed thus far and all questions were answered to the best of my ability. The patient demonstrates understanding and agreement with plan.       ____________________________________________   FINAL CLINICAL IMPRESSION(S) / ED DIAGNOSES  Final diagnoses:  Community acquired pneumonia of right lower lobe of lung ([place])      NEW MEDICATIONS STARTED DURING THIS VISIT:  New Prescriptions   No medications on file     Note:  This document was prepared using Dragon voice recognition software and may include unintentional dictation errors.    Merlyn Lot, MD 12/03/16 2100

## 2016-12-03 NOTE — ED Notes (Signed)
Pt ambulate w/o issue.  O2 saturation 93%

## 2016-12-03 NOTE — Discharge Instructions (Signed)
Dr. [name] said to continue bactrim.  Return for sudden worsening of fevers, shortness of breath, chest pain or inability to take your medications.

## 2016-12-03 NOTE — ED Triage Notes (Signed)
Pt to ED from urgent care. Pt states that she was released from our facility on Wednesday. Pt went to urgent care today for follow up. Pt was told that her chest x-ray showed that she had fluid on her lungs and urgent sent her for eval for CHF.

## 2016-12-13 ENCOUNTER — Emergency Department
Admission: EM | Admit: 2016-12-13 | Discharge: 2016-12-13 | Disposition: A | Discharge status: Home / Self Care | Payer: Medicare Other | Attending: Emergency Medicine | Admitting: Emergency Medicine

## 2016-12-13 ENCOUNTER — Encounter: Payer: Self-pay | Admitting: Emergency Medicine

## 2016-12-13 ENCOUNTER — Emergency Department: Payer: Medicare Other

## 2016-12-13 DIAGNOSIS — Z79899 Other long term (current) drug therapy: Secondary | ICD-10-CM | POA: Insufficient documentation

## 2016-12-13 DIAGNOSIS — F17210 Nicotine dependence, cigarettes, uncomplicated: Secondary | ICD-10-CM | POA: Insufficient documentation

## 2016-12-13 DIAGNOSIS — I10 Essential (primary) hypertension: Secondary | ICD-10-CM | POA: Diagnosis not present

## 2016-12-13 DIAGNOSIS — Z21 Asymptomatic human immunodeficiency virus [HIV] infection status: Secondary | ICD-10-CM | POA: Insufficient documentation

## 2016-12-13 DIAGNOSIS — R1011 Right upper quadrant pain: Secondary | ICD-10-CM

## 2016-12-13 DIAGNOSIS — J45909 Unspecified asthma, uncomplicated: Secondary | ICD-10-CM | POA: Insufficient documentation

## 2016-12-13 DIAGNOSIS — J449 Chronic obstructive pulmonary disease, unspecified: Secondary | ICD-10-CM | POA: Diagnosis not present

## 2016-12-13 DIAGNOSIS — R0789 Other chest pain: Secondary | ICD-10-CM | POA: Insufficient documentation

## 2016-12-13 DIAGNOSIS — M7918 Myalgia, other site: Secondary | ICD-10-CM

## 2016-12-13 DIAGNOSIS — M791 Myalgia: Secondary | ICD-10-CM | POA: Insufficient documentation

## 2016-12-13 LAB — BASIC METABOLIC PANEL
Anion gap: 7 (ref 5–15)
BUN: 17 mg/dL (ref 6–20)
CO2: 21 mmol/L — ABNORMAL LOW (ref 22–32)
Calcium: 8.7 mg/dL — ABNORMAL LOW (ref 8.9–10.3)
Chloride: 108 mmol/L (ref 101–111)
Creatinine, Ser: 0.97 mg/dL (ref 0.44–1.00)
GFR calc Af Amer: >60 mL/min (ref >60)
GFR calc non Af Amer: >60 mL/min (ref >60)
Glucose, Bld: 99 mg/dL (ref 65–99)
Potassium: 3.8 mmol/L (ref 3.5–5.1)
Sodium: 136 mmol/L (ref 135–145)

## 2016-12-13 LAB — HEPATIC FUNCTION PANEL
ALT: 14 U/L (ref 14–54)
AST: 21 U/L (ref 15–41)
Albumin: 2.9 g/dL — ABNORMAL LOW (ref 3.5–5.0)
Alkaline Phosphatase: 66 U/L (ref 38–126)
Bilirubin, Direct: <0.1 mg/dL — ABNORMAL LOW (ref 0.1–0.5)
Total Bilirubin: 0.4 mg/dL (ref 0.3–1.2)
Total Protein: 6.5 g/dL (ref 6.5–8.1)

## 2016-12-13 LAB — TROPONIN I: Troponin I: <0.03 ng/mL (ref <0.03)

## 2016-12-13 LAB — CBC
HCT: 30.6 % — ABNORMAL LOW (ref 35.0–47.0)
Hemoglobin: 10.1 g/dL — ABNORMAL LOW (ref 12.0–16.0)
MCH: 26.1 pg (ref 26.0–34.0)
MCHC: 33.1 g/dL (ref 32.0–36.0)
MCV: 78.9 fL — ABNORMAL LOW (ref 80.0–100.0)
Platelets: 721 10*3/uL — ABNORMAL HIGH (ref 150–440)
RBC: 3.88 MIL/uL (ref 3.80–5.20)
RDW: 18.4 % — ABNORMAL HIGH (ref 11.5–14.5)
WBC: 6.2 10*3/uL (ref 3.6–11.0)

## 2016-12-13 MED ORDER — ASPIRIN 81 MG PO CHEW
324.0000 mg | CHEWABLE_TABLET | Freq: Once | ORAL | Status: AC
  Administered 2016-12-13: 324 mg via ORAL
  Filled 2016-12-13: qty 4

## 2016-12-13 MED ORDER — NITROGLYCERIN 0.4 MG SL SUBL
0.4000 mg | SUBLINGUAL_TABLET | SUBLINGUAL | Status: DC | PRN
  Administered 2016-12-13: 0.4 mg via SUBLINGUAL
  Filled 2016-12-13: qty 1

## 2016-12-13 MED ORDER — OXYCODONE-ACETAMINOPHEN 5-325 MG PO TABS
1.0000 | ORAL_TABLET | Freq: Once | ORAL | Status: AC
  Administered 2016-12-13: 1 via ORAL
  Filled 2016-12-13: qty 1

## 2016-12-13 NOTE — ED Provider Notes (Signed)
[facility] Emergency Department Provider Note ____________________________________________   I have reviewed the triage vital signs and the triage nursing note.  HISTORY  Chief Complaint Chest Pain and Shortness of Breath   Historian Patient  HPI Melanie Bowen is a 56 y.o. female with a history of recent treatment for pneumonia, presents with pain at the very low right chest wall/right upper quadrant of the abdomen. She does have GERD and has ongoing burping and belching, but this is pretty similar to prior. No prior stress test that she knows of. She should for hypertension, no history of hyperlipidemia. She is a former smoker. She believes her family does have cardiac family history.  Mild shortness of breath and wheezing which is the similar to baseline of her COPD, no worsening trouble breathing.  Her complaint is moderate to severe right-sided lower chest margin or upper abdominal pain that is worse when she moves her trunk, sits up, breathes or coughs.    Past Medical History:  Diagnosis Date  . Allergic rhinitis   . Arthritis    DDD  . Asthma   . Blepharitis   . Bronchitis   . Cigarette smoker   . Claustrophobia   . COPD (chronic obstructive pulmonary disease) ([place])   . Cough   . Depression   . Eye irritation   . Genital herpes   . GERD (gastroesophageal reflux disease)   . Headache   . HIV (human immunodeficiency virus infection) ([place])   . Hypertension   . Paresthesia of hand, bilateral   . Shortness of breath dyspnea    at times due to asthma    Patient Active Problem List   Diagnosis Date Noted  . Community acquired pneumonia 11/28/2016  . Pneumonia 11/28/2016  . Allergic rhinitis 05/27/2016  . Tobacco abuse counseling 02/11/2016  . Symptomatic anemia   . Lower GI bleed   . Anemia 01/16/2016  . GI bleed 01/10/2016  . GERD (gastroesophageal reflux disease) 01/10/2016  . HTN (hypertension) 01/10/2016  . Depression  01/10/2016  . HIV (human immunodeficiency virus infection) ([place]) 01/10/2016  . Lumbar pseudoarthrosis 10/31/2015  . Chronic cough   . Cough 06/19/2015  . DDD (degenerative disc disease), lumbar 12/31/2014  . Degenerative disc disease, lumbar 12/31/2014  . Asthma, chronic 11/12/2014    Past Surgical History:  Procedure Laterality Date  . BACK SURGERY    . back surgery may 2016     lumbar   . BUNIONECTOMY Bilateral    feet  . DIAGNOSTIC LAPAROSCOPY    . ESOPHAGOGASTRODUODENOSCOPY Left 01/11/2016   Procedure: ESOPHAGOGASTRODUODENOSCOPY (EGD);  Surgeon: Hulen Luster, MD;  Location: [facility] ENDOSCOPY;  Service: Endoscopy;  Laterality: Left;  . Finger fracture Right    5th finger  . HAND SURGERY Bilateral    carpal tunnel  . JOINT REPLACEMENT Bilateral    knees  . KNEE SURGERY Bilateral 2003,2007   total Joint  . LAPAROSCOPY FOR ECTOPIC PREGNANCY    . PERIPHERAL VASCULAR CATHETERIZATION N/A 01/12/2016   Procedure: Visceral Angiography;  Surgeon: Algernon Huxley, MD;  Location: [place] CV LAB;  Service: Cardiovascular;  Laterality: N/A;  . PERIPHERAL VASCULAR CATHETERIZATION N/A 01/12/2016   Procedure: Visceral Artery Intervention;  Surgeon: Algernon Huxley, MD;  Location: [place] CV LAB;  Service: Cardiovascular;  Laterality: N/A;  . TUBAL LIGATION    . VIDEO BRONCHOSCOPY N/A 06/30/2015   Procedure: VIDEO BRONCHOSCOPY WITHOUT FLUORO;  Surgeon: Vilinda Boehringer, MD;  Location: [facility] ORS;  Service: Cardiopulmonary;  Laterality: N/A;    Prior to Admission medications   Medication Sig Start Date End Date Taking? Authorizing Provider  albuterol (PROVENTIL HFA;VENTOLIN HFA) 108 (90 BASE) MCG/ACT inhaler Inhale 2 puffs into the lungs every 6 (six) hours as needed for wheezing or shortness of breath.    Yes Historical Provider, MD  amLODipine (NORVASC) 10 MG tablet Take 10 mg by mouth daily.    Yes Historical Provider, MD  amoxicillin-clavulanate (AUGMENTIN) 875-125 MG tablet Take 1 tablet by  mouth 2 (two) times daily. 12/01/16  Yes Vipul Manuella Ghazi, MD  Fluticasone-Salmeterol (ADVAIR DISKUS) 500-50 MCG/DOSE AEPB Inhale 1 puff into the lungs 2 (two) times daily. 05/27/16  Yes Vishal Mungal, MD  GENVOYA 150-150-200-10 MG TABS tablet Take 1 tablet by mouth daily. 11/09/16  Yes Historical Provider, MD  hydrochlorothiazide (HYDRODIURIL) 25 MG tablet Take 25 mg by mouth daily.   Yes Historical Provider, MD  ipratropium-albuterol (DUONEB) 0.5-2.5 (3) MG/3ML SOLN Inhale 3 mLs into the lungs every 6 (six) hours as needed (for wheezing/shortness of breath).    Yes Historical Provider, MD  losartan (COZAAR) 50 MG tablet Take 50 mg by mouth daily.   Yes Historical Provider, MD  magic mouthwash SOLN Take 5 mLs by mouth 4 (four) times daily as needed for mouth pain. 12/01/16  Yes Vipul Manuella Ghazi, MD  omeprazole (PRILOSEC) 40 MG capsule Take 40 mg by mouth daily.    Yes Historical Provider, MD  oxyCODONE (ROXICODONE) 15 MG immediate release tablet Take 15 mg by mouth every 6 (six) hours as needed for pain.   Yes Historical Provider, MD  polyethylene glycol (MIRALAX / GLYCOLAX) packet Take 17 g by mouth daily. 12/01/16  Yes Vipul Manuella Ghazi, MD  promethazine (PHENERGAN) 25 MG tablet Take 25 mg by mouth every 6 (six) hours as needed for nausea or vomiting.    Yes Historical Provider, MD  valACYclovir (VALTREX) 500 MG tablet Take 500 mg by mouth 2 (two) times daily.   Yes Historical Provider, MD  dicyclomine (BENTYL) 10 MG capsule Take 1 capsule (10 mg total) by mouth 3 (three) times daily as needed (Abdominal cramping). Patient not taking: Reported on 11/28/2016 01/18/16   Max Sane, MD  efavirenz-emtricitabine-tenofovir (ATRIPLA) [phone] MG per tablet Take 1 tablet by mouth at bedtime.    Historical Provider, MD  guaiFENesin-codeine 100-10 MG/5ML syrup Take 10 mLs by mouth 3 (three) times daily as needed. Patient not taking: Reported on 12/13/2016 08/28/16   Victorino Dike, FNP    Allergies  Allergen Reactions  .  Morphine And Related Itching  . Gabapentin Rash  . Zanaflex  [Tizanidine] Rash    Family History  Problem Relation Age of Onset  . Breast cancer Sister     Social History Social History  Substance Use Topics  . Smoking status: Light Tobacco Smoker    Years: 38.00    Types: Cigarettes  . Smokeless tobacco: Never Used     Comment: 1 cig daily  . Alcohol use 3.0 oz/week    5 Glasses of wine per week     Comment: wine - couple days of week    Review of Systems  Constitutional: Negative for fever. Eyes: Negative for visual changes. ENT: Negative for sore throat. Cardiovascular: Positive for chest pain. Respiratory: Positive for chronic shortness of breath. Gastrointestinal: Negative for vomiting and diarrhea. Genitourinary: Negative for dysuria. Musculoskeletal: Negative for back pain. Skin: Negative for rash. Neurological: Negative for headache. 10 point Review of Systems otherwise negative ____________________________________________   PHYSICAL  EXAM:  VITAL SIGNS: ED Triage Vitals  Enc Vitals Group     BP 12/13/16 0512 (!) 126/59     Pulse Rate 12/13/16 0512 89     Resp 12/13/16 0512 (!) 22     Temp 12/13/16 0512 98.5 F (36.9 C)     Temp Source 12/13/16 0512 Oral     SpO2 12/13/16 0512 100 %     Weight 12/13/16 0513 280 lb (127 kg)     Height 12/13/16 0513 5\' 6"  (1.676 m)     Head Circumference --      Peak Flow --      Pain Score --      Pain Loc --      Pain Edu? --      Excl. in [place]? --      Constitutional: Alert and oriented. Well appearing and in no distress. HEENT   Head: Normocephalic and atraumatic.      Eyes: Conjunctivae are normal. PERRL. Normal extraocular movements.      Ears:         Nose: No congestion/rhinnorhea.   Mouth/Throat: Mucous membranes are moist.   Neck: No stridor. Cardiovascular/Chest: Normal rate, regular rhythm.  No murmurs, rubs, or gallops.  Tenderness of the right chest wall at the rib margin. Respiratory:  Normal respiratory effort without tachypnea nor retractions. Mild occasional end expiratory wheezing all fields. Gastrointestinal: Soft. No distention, no guarding, no rebound. Mild tenderness in the right upper quadrant along the rib margin and just under.  Obese  Genitourinary/rectal:Deferred Musculoskeletal: Nontender with normal range of motion in all extremities. No joint effusions.  No lower extremity tenderness.  No edema. Neurologic:  Normal speech and language. No gross or focal neurologic deficits are appreciated. Skin:  Skin is warm, dry and intact. No rash noted. Psychiatric: Mood and affect are normal. Speech and behavior are normal. Patient exhibits appropriate insight and judgment.   ____________________________________________  LABS (pertinent positives/negatives)  Labs Reviewed  BASIC METABOLIC PANEL - Abnormal; Notable for the following:       Result Value   CO2 21 (*)    Calcium 8.7 (*)    All other components within normal limits  CBC - Abnormal; Notable for the following:    Hemoglobin 10.1 (*)    HCT 30.6 (*)    MCV 78.9 (*)    RDW 18.4 (*)    Platelets 721 (*)    All other components within normal limits  HEPATIC FUNCTION PANEL - Abnormal; Notable for the following:    Albumin 2.9 (*)    Bilirubin, Direct <0.1 (*)    All other components within normal limits  TROPONIN I    ____________________________________________    EKG I, Lisa Roca, MD, the attending physician have personally viewed and interpreted all ECGs.  91 bpm. normal sinus rhythm. Narrow QRS. Normal axis. Normal ST and T-wave ____________________________________________  RADIOLOGY All Xrays were viewed by me. Imaging interpreted by Radiologist.  Chest x-ray two-view: No active cardiopulmonary disease.  Ultrasound right upper quadrant:  IMPRESSION: Probable minimal gallbladder sludge is noted. No other significant abnormality seen in the right upper quadrant of the  abdomen. __________________________________________  PROCEDURES  Procedure(s) performed: None  Critical Care performed: None  ____________________________________________   ED COURSE / ASSESSMENT AND PLAN  Pertinent labs & imaging results that were available during my care of the patient were reviewed by me and considered in my medical decision making (see chart for details).   Ms. Revak is here  for evaluation of right lower chest/right upper abdominal pain. It seems most likely musculoskeletal especially given recent coughing that she may have pulled a muscle there. She's not complaining of any worsening shortness of breath and she's not hypoxic. She does have some mild wheezing which is consistent with her on her lying COPD, does not seem to be worse.  I'm not quite sure if the pains going from the lower chest wall or the right upper quadrant. She does have a history of indigestion but this pain is located much more laterally.  Doubt cardiac cause in location, description and duration of discomfort with reassuring ecg and troponin.  Ultrasound shows minimal sludge. I discussed this with her. No evidence of cholecystitis.    CONSULTATIONS:  None Patient / Family / Caregiver informed of clinical course, medical decision-making process, and agree with plan.   I discussed return precautions, follow-up instructions, and discharge instructions with patient and/or family.  Discharge instructions:You were evaluated for chest discomfort, and as we discussed, I'm most suspicious of a muscle pull or musculoskeletal source of pain. You may take over-the-counter Tylenol and/or ibuprofen, use as directed on label.  Because of several risk factors, I am recommending that you follow-up with a cardiologist, and Dr. Alveria Apley office number is provided. Call to make an appointment this week.  Return to the emergency department immediately for any new or worsening symptoms including chest pain,  nausea, sweats, dizziness, passing out, trouble breathing, or any other symptoms concerning to you. ___________________________________________   FINAL CLINICAL IMPRESSION(S) / ED DIAGNOSES   Final diagnoses:  RUQ pain  Musculoskeletal pain  Atypical chest pain              Note: This dictation was prepared with Dragon dictation. Any transcriptional errors that result from this process are unintentional    Lisa Roca, MD 12/13/16 1031

## 2016-12-13 NOTE — ED Notes (Signed)
Pt. States she was woke up by rt. Chest pain.  Pt. States it has been an ongoing issue under rt. Breast.  Pt. States it come is waves of pain to rt. Under breast.

## 2016-12-13 NOTE — ED Triage Notes (Signed)
Patient with complaint of chest pain and shortness of breath that woke her out of her sleep about 30 minutes PTA. Patient with pain to right side of her chest below her breast. Patient reports that the pain increases with breathing and coughing.  Patient states that she was discharged from here about a week ago with pneumonia.

## 2016-12-13 NOTE — Discharge Instructions (Signed)
You were evaluated for chest discomfort, and as we discussed, I'm most suspicious of a muscle pull or musculoskeletal source of pain. You may take over-the-counter Tylenol and/or ibuprofen, use as directed on label.  Because of several risk factors, I am recommending that you follow-up with a cardiologist, and Dr. Alveria Apley office number is provided. Call to make an appointment this week.  Return to the emergency department immediately for any new or worsening symptoms including chest pain, nausea, sweats, dizziness, passing out, trouble breathing, or any other symptoms concerning to you.

## 2016-12-13 NOTE — ED Notes (Signed)
Patient transported to Ultrasound 

## 2016-12-23 ENCOUNTER — Emergency Department
Admission: EM | Admit: 2016-12-23 | Discharge: 2016-12-23 | Disposition: A | Discharge status: Home / Self Care | Payer: Medicare Other | Attending: Emergency Medicine | Admitting: Emergency Medicine

## 2016-12-23 ENCOUNTER — Encounter: Payer: Self-pay | Admitting: Emergency Medicine

## 2016-12-23 ENCOUNTER — Emergency Department: Payer: Medicare Other

## 2016-12-23 DIAGNOSIS — F17210 Nicotine dependence, cigarettes, uncomplicated: Secondary | ICD-10-CM | POA: Diagnosis not present

## 2016-12-23 DIAGNOSIS — Z79899 Other long term (current) drug therapy: Secondary | ICD-10-CM | POA: Insufficient documentation

## 2016-12-23 DIAGNOSIS — J209 Acute bronchitis, unspecified: Secondary | ICD-10-CM | POA: Insufficient documentation

## 2016-12-23 DIAGNOSIS — J45909 Unspecified asthma, uncomplicated: Secondary | ICD-10-CM | POA: Insufficient documentation

## 2016-12-23 DIAGNOSIS — I10 Essential (primary) hypertension: Secondary | ICD-10-CM | POA: Diagnosis not present

## 2016-12-23 DIAGNOSIS — Z21 Asymptomatic human immunodeficiency virus [HIV] infection status: Secondary | ICD-10-CM | POA: Diagnosis not present

## 2016-12-23 DIAGNOSIS — R0602 Shortness of breath: Secondary | ICD-10-CM | POA: Diagnosis present

## 2016-12-23 DIAGNOSIS — J449 Chronic obstructive pulmonary disease, unspecified: Secondary | ICD-10-CM | POA: Diagnosis not present

## 2016-12-23 LAB — BASIC METABOLIC PANEL
Anion gap: 9 (ref 5–15)
BUN: 11 mg/dL (ref 6–20)
CO2: 19 mmol/L — ABNORMAL LOW (ref 22–32)
Calcium: 8.3 mg/dL — ABNORMAL LOW (ref 8.9–10.3)
Chloride: 108 mmol/L (ref 101–111)
Creatinine, Ser: 0.87 mg/dL (ref 0.44–1.00)
GFR calc Af Amer: >60 mL/min (ref >60)
GFR calc non Af Amer: >60 mL/min (ref >60)
Glucose, Bld: 124 mg/dL — ABNORMAL HIGH (ref 65–99)
Potassium: 3.3 mmol/L — ABNORMAL LOW (ref 3.5–5.1)
Sodium: 136 mmol/L (ref 135–145)

## 2016-12-23 LAB — CBC
HCT: 31.5 % — ABNORMAL LOW (ref 35.0–47.0)
Hemoglobin: 10.3 g/dL — ABNORMAL LOW (ref 12.0–16.0)
MCH: 26.5 pg (ref 26.0–34.0)
MCHC: 32.6 g/dL (ref 32.0–36.0)
MCV: 81.3 fL (ref 80.0–100.0)
Platelets: 353 10*3/uL (ref 150–440)
RBC: 3.88 MIL/uL (ref 3.80–5.20)
RDW: 19.3 % — ABNORMAL HIGH (ref 11.5–14.5)
WBC: 4.3 10*3/uL (ref 3.6–11.0)

## 2016-12-23 LAB — TROPONIN I: Troponin I: <0.03 ng/mL (ref <0.03)

## 2016-12-23 MED ORDER — GUAIFENESIN-CODEINE 100-10 MG/5ML PO SOLN: 5.0000 mL | Freq: Four times a day (QID) | ORAL | 0 refills | Status: DC | PRN

## 2016-12-23 MED ORDER — AZITHROMYCIN 250 MG PO TABS: ORAL_TABLET | ORAL | 0 refills | Status: AC

## 2016-12-23 NOTE — ED Triage Notes (Signed)
Patient presents to the ED with cough, congestion, shortness of breath and chest tightness that began yesterday.  Patient reports coughing up green mucous.  Patient states, "I feel like my pneumonia came back."  Patient was diagnosed with and treated for pneumonia near the beginning of April.  Patient is in no obvious distress at this time.

## 2016-12-23 NOTE — ED Provider Notes (Signed)
[facility] Emergency Department Provider Note  Time seen: 1:40 PM  I have reviewed the triage vital signs and the nursing notes.   HISTORY  Chief Complaint Shortness of Breath and Chest Pain    HPI Melanie Bowen is a 56 y.o. female with a past medical history of bronchitis, COPD, asthma, HIV, hypertension, presents the emergency department with cough and sputum production. According to the patient she was diagnosed with pneumonia at the beginning of April. Patient has been taking Bactrim. She states she got better, cough and away however over the past few days the cough has come back again with sputum production. Denies any fever. States shortness of breath but only with exertion. Mild chest discomfort but only when coughing. Patient states a very frequent cough which has been keeping her up at night.  Past Medical History:  Diagnosis Date  . Allergic rhinitis   . Arthritis    DDD  . Asthma   . Blepharitis   . Bronchitis   . Cigarette smoker   . Claustrophobia   . COPD (chronic obstructive pulmonary disease) ([place])   . Cough   . Depression   . Eye irritation   . Genital herpes   . GERD (gastroesophageal reflux disease)   . Headache   . HIV (human immunodeficiency virus infection) ([place])   . Hypertension   . Paresthesia of hand, bilateral   . Shortness of breath dyspnea    at times due to asthma    Patient Active Problem List   Diagnosis Date Noted  . Community acquired pneumonia 11/28/2016  . Pneumonia 11/28/2016  . Allergic rhinitis 05/27/2016  . Tobacco abuse counseling 02/11/2016  . Symptomatic anemia   . Lower GI bleed   . Anemia 01/16/2016  . GI bleed 01/10/2016  . GERD (gastroesophageal reflux disease) 01/10/2016  . HTN (hypertension) 01/10/2016  . Depression 01/10/2016  . HIV (human immunodeficiency virus infection) ([place]) 01/10/2016  . Lumbar pseudoarthrosis 10/31/2015  . Chronic cough   . Cough 06/19/2015  . DDD (degenerative  disc disease), lumbar 12/31/2014  . Degenerative disc disease, lumbar 12/31/2014  . Asthma, chronic 11/12/2014    Past Surgical History:  Procedure Laterality Date  . BACK SURGERY    . back surgery may 2016     lumbar   . BUNIONECTOMY Bilateral    feet  . DIAGNOSTIC LAPAROSCOPY    . ESOPHAGOGASTRODUODENOSCOPY Left 01/11/2016   Procedure: ESOPHAGOGASTRODUODENOSCOPY (EGD);  Surgeon: Hulen Luster, MD;  Location[facility] ENDOSCOPY;  Service: Endoscopy;  Laterality: Left;  . Finger fracture Right    5th finger  . HAND SURGERY Bilateral    carpal tunnel  . JOINT REPLACEMENT Bilateral    knees  . KNEE SURGERY Bilateral 2003,2007   total Joint  . LAPAROSCOPY FOR ECTOPIC PREGNANCY    . PERIPHERAL VASCULAR CATHETERIZATION N/A 01/12/2016   Procedure: Visceral Angiography;  Surgeon: Algernon Huxley, MD;  Location: [place] CV LAB;  Service: Cardiovascular;  Laterality: N/A;  . PERIPHERAL VASCULAR CATHETERIZATION N/A 01/12/2016   Procedure: Visceral Artery Intervention;  Surgeon: Algernon Huxley, MD;  Location: [place] CV LAB;  Service: Cardiovascular;  Laterality: N/A;  . TUBAL LIGATION    . VIDEO BRONCHOSCOPY N/A 06/30/2015   Procedure: VIDEO BRONCHOSCOPY WITHOUT FLUORO;  Surgeon: Vilinda Boehringer, MD;  Location: [facility] ORS;  Service: Cardiopulmonary;  Laterality: N/A;    Prior to Admission medications   Medication Sig Start Date End Date Taking? Authorizing Provider  albuterol (PROVENTIL HFA;VENTOLIN  HFA) 108 (90 BASE) MCG/ACT inhaler Inhale 2 puffs into the lungs every 6 (six) hours as needed for wheezing or shortness of breath.     Historical Provider, MD  amLODipine (NORVASC) 10 MG tablet Take 10 mg by mouth daily.     Historical Provider, MD  amoxicillin-clavulanate (AUGMENTIN) 875-125 MG tablet Take 1 tablet by mouth 2 (two) times daily. 12/01/16   Max Sane, MD  dicyclomine (BENTYL) 10 MG capsule Take 1 capsule (10 mg total) by mouth 3 (three) times daily as needed (Abdominal  cramping). Patient not taking: Reported on 11/28/2016 01/18/16   Max Sane, MD  efavirenz-emtricitabine-tenofovir (ATRIPLA) [phone] MG per tablet Take 1 tablet by mouth at bedtime.    Historical Provider, MD  Fluticasone-Salmeterol (ADVAIR DISKUS) 500-50 MCG/DOSE AEPB Inhale 1 puff into the lungs 2 (two) times daily. 05/27/16   Vishal Mungal, MD  GENVOYA 150-150-200-10 MG TABS tablet Take 1 tablet by mouth daily. 11/09/16   Historical Provider, MD  guaiFENesin-codeine 100-10 MG/5ML syrup Take 10 mLs by mouth 3 (three) times daily as needed. Patient not taking: Reported on 12/13/2016 08/28/16   Victorino Dike, FNP  hydrochlorothiazide (HYDRODIURIL) 25 MG tablet Take 25 mg by mouth daily.    Historical Provider, MD  ipratropium-albuterol (DUONEB) 0.5-2.5 (3) MG/3ML SOLN Inhale 3 mLs into the lungs every 6 (six) hours as needed (for wheezing/shortness of breath).     Historical Provider, MD  losartan (COZAAR) 50 MG tablet Take 50 mg by mouth daily.    Historical Provider, MD  magic mouthwash SOLN Take 5 mLs by mouth 4 (four) times daily as needed for mouth pain. 12/01/16   Max Sane, MD  omeprazole (PRILOSEC) 40 MG capsule Take 40 mg by mouth daily.     Historical Provider, MD  oxyCODONE (ROXICODONE) 15 MG immediate release tablet Take 15 mg by mouth every 6 (six) hours as needed for pain.    Historical Provider, MD  polyethylene glycol (MIRALAX / GLYCOLAX) packet Take 17 g by mouth daily. 12/01/16   Max Sane, MD  promethazine (PHENERGAN) 25 MG tablet Take 25 mg by mouth every 6 (six) hours as needed for nausea or vomiting.     Historical Provider, MD  valACYclovir (VALTREX) 500 MG tablet Take 500 mg by mouth 2 (two) times daily.    Historical Provider, MD    Allergies  Allergen Reactions  . Morphine And Related Itching  . Gabapentin Rash  . Zanaflex  [Tizanidine] Rash    Family History  Problem Relation Age of Onset  . Breast cancer Sister     Social History Social History  Substance Use  Topics  . Smoking status: Light Tobacco Smoker    Years: 38.00    Types: Cigarettes  . Smokeless tobacco: Never Used     Comment: 1 cig daily  . Alcohol use 3.0 oz/week    5 Glasses of wine per week     Comment: wine - couple days of week    Review of Systems Constitutional: Negative for fever. Eyes: Negative for red or itching eyes. ENT: Negative for congestion Cardiovascular: Mild chest discomfort Respiratory: Shortness of breath with exertion. Positive for cough. Gastrointestinal: Negative for abdominal pain, vomiting and diarrhea. Genitourinary: Negative for dysuria. Musculoskeletal: Negative for back pain. Negative for leg pain. Skin: Negative for rash. Neurological: Negative for headache All other ROS negative  ____________________________________________   PHYSICAL EXAM:  VITAL SIGNS: ED Triage Vitals  Enc Vitals Group     BP 12/23/16 1107 124/64  Pulse Rate 12/23/16 1107 (!) 106     Resp 12/23/16 1107 (!) 22     Temp 12/23/16 1107 98.2 F (36.8 C)     Temp Source 12/23/16 1107 Oral     SpO2 12/23/16 1107 99 %     Weight 12/23/16 1107 300 lb (136.1 kg)     Height 12/23/16 1107 5\' 6"  (1.676 m)     Head Circumference --      Peak Flow --      Pain Score 12/23/16 1106 9     Pain Loc --      Pain Edu? --      Excl. in [place]? --     Constitutional: Alert and oriented. Well appearing and in no distress. Eyes: Normal exam ENT   Head: Normocephalic and atraumatic   Mouth/Throat: Mucous membranes are moist. Cardiovascular: Normal rate, regular rhythm. No murmur Respiratory: Normal respiratory effort without tachypnea nor retractions. Breath sounds are clear Gastrointestinal: Soft and nontender. No distention. Musculoskeletal: Nontender with normal range of motion in all extremities.  Neurologic:  Normal speech and language. No gross focal neurologic deficits  Skin:  Skin is warm, dry and intact.  Psychiatric: Mood and affect are normal.    ____________________________________________    EKG  EKG reviewed and interpreted by myself shows normal sinus rhythm at 80 bpm, narrow QRS, normal axis, normal intervals, no concerning ST changes.  ____________________________________________    RADIOLOGY  X-ray most consistent with bronchitis  ____________________________________________   INITIAL IMPRESSION / ASSESSMENT AND PLAN / ED COURSE  Pertinent labs & imaging results that were available during my care of the patient were reviewed by me and considered in my medical decision making (see chart for details).  Patient presents to the emergency department with cough and shortness breath. Patient has a frequent cough during examination. Vitals are largely normal with mild tachypnea. 99% room air saturation. Labs are normal to normal white blood cell count. Troponin is negative. X-ray consistent with bronchitic changes  Given the patient's history of recent pneumonia diagnosis with now worsening cough or sputum production we will cover with Zithromax. We will also discharge with a cough medication. The patient will follow up with a primary care doctor. I discussed return precautions with the patient.  ____________________________________________   FINAL CLINICAL IMPRESSION(S) / ED DIAGNOSES  Acute bronchitis    Harvest Dark, MD 12/23/16 1343

## 2017-02-07 ENCOUNTER — Inpatient Hospital Stay: Payer: Medicare Other | Admitting: Internal Medicine

## 2017-02-07 ENCOUNTER — Emergency Department: Payer: Medicare Other

## 2017-02-07 ENCOUNTER — Encounter: Payer: Self-pay | Admitting: Emergency Medicine

## 2017-02-07 ENCOUNTER — Emergency Department
Admission: EM | Admit: 2017-02-07 | Discharge: 2017-02-07 | Disposition: A | Discharge status: Home / Self Care | Payer: Medicare Other | Attending: Emergency Medicine | Admitting: Emergency Medicine

## 2017-02-07 DIAGNOSIS — R0602 Shortness of breath: Secondary | ICD-10-CM | POA: Diagnosis not present

## 2017-02-07 DIAGNOSIS — Z79899 Other long term (current) drug therapy: Secondary | ICD-10-CM | POA: Insufficient documentation

## 2017-02-07 DIAGNOSIS — J449 Chronic obstructive pulmonary disease, unspecified: Secondary | ICD-10-CM | POA: Diagnosis not present

## 2017-02-07 DIAGNOSIS — I10 Essential (primary) hypertension: Secondary | ICD-10-CM | POA: Insufficient documentation

## 2017-02-07 DIAGNOSIS — Z87891 Personal history of nicotine dependence: Secondary | ICD-10-CM | POA: Diagnosis not present

## 2017-02-07 DIAGNOSIS — R05 Cough: Secondary | ICD-10-CM | POA: Diagnosis not present

## 2017-02-07 DIAGNOSIS — R059 Cough, unspecified: Secondary | ICD-10-CM

## 2017-02-07 DIAGNOSIS — Z96653 Presence of artificial knee joint, bilateral: Secondary | ICD-10-CM | POA: Diagnosis not present

## 2017-02-07 LAB — CBC
HCT: 31.8 % — ABNORMAL LOW (ref 35.0–47.0)
Hemoglobin: 10.5 g/dL — ABNORMAL LOW (ref 12.0–16.0)
MCH: 25.5 pg — ABNORMAL LOW (ref 26.0–34.0)
MCHC: 33.1 g/dL (ref 32.0–36.0)
MCV: 77.1 fL — ABNORMAL LOW (ref 80.0–100.0)
Platelets: 464 10*3/uL — ABNORMAL HIGH (ref 150–440)
RBC: 4.12 MIL/uL (ref 3.80–5.20)
RDW: 18.3 % — ABNORMAL HIGH (ref 11.5–14.5)
WBC: 8.9 10*3/uL (ref 3.6–11.0)

## 2017-02-07 LAB — COMPREHENSIVE METABOLIC PANEL
ALT: 16 U/L (ref 14–54)
AST: 22 U/L (ref 15–41)
Albumin: 3.5 g/dL (ref 3.5–5.0)
Alkaline Phosphatase: 91 U/L (ref 38–126)
Anion gap: 8 (ref 5–15)
BUN: 17 mg/dL (ref 6–20)
CO2: 23 mmol/L (ref 22–32)
Calcium: 8.9 mg/dL (ref 8.9–10.3)
Chloride: 108 mmol/L (ref 101–111)
Creatinine, Ser: 1.14 mg/dL — ABNORMAL HIGH (ref 0.44–1.00)
GFR calc Af Amer: >60 mL/min (ref >60)
GFR calc non Af Amer: 54 mL/min — ABNORMAL LOW (ref >60)
Glucose, Bld: 105 mg/dL — ABNORMAL HIGH (ref 65–99)
Potassium: 3.4 mmol/L — ABNORMAL LOW (ref 3.5–5.1)
Sodium: 139 mmol/L (ref 135–145)
Total Bilirubin: 0.5 mg/dL (ref 0.3–1.2)
Total Protein: 7.3 g/dL (ref 6.5–8.1)

## 2017-02-07 LAB — TROPONIN I: Troponin I: <0.03 ng/mL (ref <0.03)

## 2017-02-07 MED ORDER — BENZONATATE 100 MG PO CAPS: 100.0000 mg | ORAL_CAPSULE | Freq: Four times a day (QID) | ORAL | 0 refills | Status: DC | PRN

## 2017-02-07 MED ORDER — PREDNISONE 20 MG PO TABS
60.0000 mg | ORAL_TABLET | Freq: Once | ORAL | Status: AC
Start: 2017-02-07 — End: 2017-02-07
  Administered 2017-02-07: 60 mg via ORAL

## 2017-02-07 MED ORDER — PREDNISONE 20 MG PO TABS: 40.0000 mg | ORAL_TABLET | Freq: Every day | ORAL | 0 refills | Status: DC

## 2017-02-07 MED ORDER — ACETYLCYSTEINE 20 % IN SOLN
3.0000 mL | Freq: Once | RESPIRATORY_TRACT | Status: DC
  Filled 2017-02-07: qty 4

## 2017-02-07 MED ORDER — PREDNISONE 20 MG PO TABS
ORAL_TABLET | ORAL | Status: AC
  Filled 2017-02-07: qty 3

## 2017-02-07 NOTE — ED Notes (Signed)
MD at bedside. 

## 2017-02-07 NOTE — Discharge Instructions (Signed)
Please seek medical attention for any high fevers, chest pain, shortness of breath, change in behavior, persistent vomiting, bloody stool or any other new or concerning symptoms.  

## 2017-02-07 NOTE — ED Notes (Signed)
Pt TV is loud and pt talking on phone loudly, Pt refuses for door to be shut. MD at bedside and asked to turn TV off and shut door due to distraction at desk and other pts. Pt stating on phone " [facility]'m at [facility] [place]"

## 2017-02-07 NOTE — ED Triage Notes (Signed)
R sided chest pain began about 2 pm today. States has SOB and cough but states this has not increased since April.

## 2017-02-07 NOTE — ED Notes (Signed)
Pt states she is leaving by 8pm if nothing is done. RN informed pt we are waiting on med from pharmacy and will give it to her as soon as we receive it.

## 2017-02-07 NOTE — ED Provider Notes (Signed)
[facility] Emergency Department Provider Note    ____________________________________________   I have reviewed the triage vital signs and the nursing notes.   HISTORY  Chief Complaint Cough, Shortness of Breath  History limited by: Not Limited   HPI Melanie Bowen is a 56 y.o. female who presents to the emergency department today because of concerns for cough and shortness of breath. Patient states that the symptoms have been present since she was diagnosed with a pneumonia in April. She states she came in today because the symptoms have been getting worse. She states she has been coughing up some light yellowish phlegm. She states there is a bad taste to the phlegm. Some associated right sided chest pain with this cough. The patient has been trying her nebulizer machine and inhalers at home without any significant improvement.    Past Medical History:  Diagnosis Date  . Allergic rhinitis   . Arthritis    DDD  . Asthma   . Blepharitis   . Bronchitis   . Cigarette smoker   . Claustrophobia   . COPD (chronic obstructive pulmonary disease) ([place])   . Cough   . Depression   . Eye irritation   . Genital herpes   . GERD (gastroesophageal reflux disease)   . Headache   . HIV (human immunodeficiency virus infection) ([place])   . Hypertension   . Paresthesia of hand, bilateral   . Shortness of breath dyspnea    at times due to asthma    Patient Active Problem List   Diagnosis Date Noted  . Community acquired pneumonia 11/28/2016  . Pneumonia 11/28/2016  . Allergic rhinitis 05/27/2016  . Tobacco abuse counseling 02/11/2016  . Symptomatic anemia   . Lower GI bleed   . Anemia 01/16/2016  . GI bleed 01/10/2016  . GERD (gastroesophageal reflux disease) 01/10/2016  . HTN (hypertension) 01/10/2016  . Depression 01/10/2016  . HIV (human immunodeficiency virus infection) ([place]) 01/10/2016  . Lumbar pseudoarthrosis 10/31/2015  . Chronic cough   . Cough  06/19/2015  . DDD (degenerative disc disease), lumbar 12/31/2014  . Degenerative disc disease, lumbar 12/31/2014  . Asthma, chronic 11/12/2014    Past Surgical History:  Procedure Laterality Date  . BACK SURGERY    . back surgery may 2016     lumbar   . BUNIONECTOMY Bilateral    feet  . DIAGNOSTIC LAPAROSCOPY    . ESOPHAGOGASTRODUODENOSCOPY Left 01/11/2016   Procedure: ESOPHAGOGASTRODUODENOSCOPY (EGD);  Surgeon: Hulen Luster, MD;  Location: [facility] ENDOSCOPY;  Service: Endoscopy;  Laterality: Left;  . Finger fracture Right    5th finger  . HAND SURGERY Bilateral    carpal tunnel  . JOINT REPLACEMENT Bilateral    knees  . KNEE SURGERY Bilateral 2003,2007   total Joint  . LAPAROSCOPY FOR ECTOPIC PREGNANCY    . PERIPHERAL VASCULAR CATHETERIZATION N/A 01/12/2016   Procedure: Visceral Angiography;  Surgeon: Algernon Huxley, MD;  Location: [place] [facility];  Service: Cardiovascular;  Laterality: N/A;  . PERIPHERAL VASCULAR CATHETERIZATION N/A 01/12/2016   Procedure: Visceral Artery Intervention;  Surgeon: Algernon Huxley, MD;  Location: [place] [facility];  Service: Cardiovascular;  Laterality: N/A;  . TUBAL LIGATION    . VIDEO BRONCHOSCOPY N/A 06/30/2015   Procedure: VIDEO BRONCHOSCOPY WITHOUT FLUORO;  Surgeon: Vilinda Boehringer, MD;  Location: [facility] ORS;  Service: Cardiopulmonary;  Laterality: N/A;    Prior to Admission medications   Medication Sig Start Date End Date Taking? Authorizing Provider  albuterol (PROVENTIL HFA;VENTOLIN HFA) 108 (90 BASE) MCG/ACT inhaler Inhale 2 puffs into the lungs every 6 (six) hours as needed for wheezing or shortness of breath.     [provider]  amLODipine (NORVASC) 10 MG tablet Take 10 mg by mouth daily.     [provider]  amoxicillin-clavulanate (AUGMENTIN) 875-125 MG tablet Take 1 tablet by mouth 2 (two) times daily. 12/01/16   Max Sane, MD  dicyclomine (BENTYL) 10 MG capsule Take 1 capsule (10 mg total) by mouth 3 (three) times daily as  needed (Abdominal cramping). Patient not taking: Reported on 11/28/2016 01/18/16   Max Sane, MD  efavirenz-emtricitabine-tenofovir (ATRIPLA) 600-200-300 MG per tablet Take 1 tablet by mouth at bedtime.    [provider]  Fluticasone-Salmeterol (ADVAIR DISKUS) 500-50 MCG/DOSE AEPB Inhale 1 puff into the lungs 2 (two) times daily. 05/27/16   Mungal, Vishal, MD  GENVOYA 150-150-200-10 MG TABS tablet Take 1 tablet by mouth daily. 11/09/16   [provider]  guaiFENesin-codeine 100-10 MG/5ML syrup Take 5 mLs by mouth every 6 (six) hours as needed for cough. 12/23/16   Harvest Dark, MD  hydrochlorothiazide (HYDRODIURIL) 25 MG tablet Take 25 mg by mouth daily.    [provider]  ipratropium-albuterol (DUONEB) 0.5-2.5 (3) MG/3ML SOLN Inhale 3 mLs into the lungs every 6 (six) hours as needed (for wheezing/shortness of breath).     [provider]  losartan (COZAAR) 50 MG tablet Take 50 mg by mouth daily.    [provider]  magic mouthwash SOLN Take 5 mLs by mouth 4 (four) times daily as needed for mouth pain. 12/01/16   Max Sane, MD  omeprazole (PRILOSEC) 40 MG capsule Take 40 mg by mouth daily.     [provider]  oxyCODONE (ROXICODONE) 15 MG immediate release tablet Take 15 mg by mouth every 6 (six) hours as needed for pain.    [provider]  polyethylene glycol (MIRALAX / GLYCOLAX) packet Take 17 g by mouth daily. 12/01/16   Max Sane, MD  promethazine (PHENERGAN) 25 MG tablet Take 25 mg by mouth every 6 (six) hours as needed for nausea or vomiting.     [provider]  valACYclovir (VALTREX) 500 MG tablet Take 500 mg by mouth 2 (two) times daily.    [provider]    Allergies Morphine and related; Gabapentin; and Zanaflex  [tizanidine]  Family History  Problem Relation Age of Onset  . Breast cancer Sister     Social History Social History  Substance Use Topics  . Smoking status: Former Smoker     Years: 38.00    Types: Cigarettes  . Smokeless tobacco: Never Used     Comment: 1 cig daily  . Alcohol use 3.0 oz/week    5 Glasses of wine per week     Comment: wine - couple days of week    Review of Systems Constitutional: No fever/chills Cardiovascular: Positive for right sided chest pain. Respiratory: Positive for shortness of breath. Gastrointestinal: No abdominal pain.  No nausea, no vomiting.  No diarrhea.   Genitourinary: Negative for dysuria. Musculoskeletal: Right arm pain. Skin: Negative for rash. Neurological: Negative for headaches, focal weakness or numbness.  ____________________________________________   PHYSICAL EXAM:  VITAL SIGNS: ED Triage Vitals  Enc Vitals Group     BP 02/07/17 1606 (!) 124/55     Pulse Rate 02/07/17 1606 77     Resp 02/07/17 1606 20     Temp 02/07/17 1606 98.4 F (  36.9 C)     Temp Source 02/07/17 1606 Oral     SpO2 02/07/17 1606 98 %     Weight 02/07/17 1608 300 lb (136.1 kg)     Height 02/07/17 1608 5\' 6"  (1.676 m)     Head Circumference --      Peak Flow --      Pain Score 02/07/17 1606 8   Constitutional: Alert and oriented. Well appearing and in no distress. Eyes: Conjunctivae are normal.  ENT   Head: Normocephalic and atraumatic.   Nose: No congestion/rhinnorhea.   Mouth/Throat: Mucous membranes are moist.   Neck: No stridor. Hematological/Lymphatic/Immunilogical: No cervical lymphadenopathy. Cardiovascular: Normal rate, regular rhythm.  No murmurs, rubs, or gallops.  Respiratory: Occasional cough. Diffuse expiratory wheezing.  Gastrointestinal: Soft and non tender. No rebound. No guarding.  Genitourinary: Deferred Musculoskeletal: Normal range of motion in all extremities. No lower extremity edema. Neurologic:  Normal speech and language. No gross focal neurologic deficits are appreciated.  Skin:  Skin is warm, dry and intact. No rash noted. Psychiatric: Mood and affect are normal. Speech and behavior are  normal. Patient exhibits appropriate insight and judgment.  ____________________________________________    LABS (pertinent positives/negatives)  Labs Reviewed  CBC - Abnormal; Notable for the following:       Result Value   Hemoglobin 10.5 (*)    HCT 31.8 (*)    MCV 77.1 (*)    MCH 25.5 (*)    RDW 18.3 (*)    Platelets 464 (*)    All other components within normal limits  COMPREHENSIVE METABOLIC PANEL - Abnormal; Notable for the following:    Potassium 3.4 (*)    Glucose, Bld 105 (*)    Creatinine, Ser 1.14 (*)    GFR calc non Af Amer 54 (*)    All other components within normal limits  TROPONIN I     ____________________________________________   EKG  I, Nance Pear, attending physician, personally viewed and interpreted this EKG  EKG Time: 1602 Rate: 77 Rhythm: normal sinus rhythm Axis: normal Intervals: qtc 470 QRS: narrow ST changes: no st elevation Impression: normal ekg   ____________________________________________    RADIOLOGY  CXR IMPRESSION: No active cardiopulmonary disease.  ____________________________________________   PROCEDURES  Procedures  ____________________________________________   INITIAL IMPRESSION / ASSESSMENT AND PLAN / ED COURSE  Pertinent labs & imaging results that were available during my care of the patient were reviewed by me and considered in my medical decision making (see chart for details).  Primary complaint of cough and shortness of breath. Chest x-ray without any pneumonia. No leukocytosis or fever to suggest pneumonia. Patient did have diffuse wheezing. Did write for mucomyst however patient did not tolerate the smell. At this point patient did not want to try any further nebulizers. Did give patient first dose of steroids here in the department. Will discharge with prescription for further steroids.   ____________________________________________   FINAL CLINICAL IMPRESSION(S) / ED DIAGNOSES  Final  diagnoses:  Cough  Shortness of breath     Note: This dictation was prepared with Dragon dictation. Any transcriptional errors that result from this process are unintentional     Nance Pear, MD 02/07/17 [phone]

## 2017-02-07 NOTE — ED Notes (Signed)
Patient presents to the ED complaining of back pain, right arm pain and right sided chest pain.  Patient states her back pain is chronic post 2 back surgeries, her right arm pain is worse when she moves her arm and began 2 weeks ago and the chest pain began today and is only on the right side.  Patient is very talkative and is in no obvious distress at this time.

## 2017-02-07 NOTE — ED Notes (Signed)
Pt refuses to finish duoneb due to mucomyst smell and taste. Pt states " just release me and I will do my nebs at home" .

## 2017-02-26 ENCOUNTER — Emergency Department
Admission: EM | Admit: 2017-02-26 | Discharge: 2017-02-26 | Disposition: A | Discharge status: Home / Self Care | Payer: Medicare Other | Attending: Emergency Medicine | Admitting: Emergency Medicine

## 2017-02-26 DIAGNOSIS — J45909 Unspecified asthma, uncomplicated: Secondary | ICD-10-CM | POA: Insufficient documentation

## 2017-02-26 DIAGNOSIS — M5412 Radiculopathy, cervical region: Secondary | ICD-10-CM | POA: Diagnosis not present

## 2017-02-26 DIAGNOSIS — B20 Human immunodeficiency virus [HIV] disease: Secondary | ICD-10-CM | POA: Insufficient documentation

## 2017-02-26 DIAGNOSIS — Z87891 Personal history of nicotine dependence: Secondary | ICD-10-CM | POA: Insufficient documentation

## 2017-02-26 DIAGNOSIS — I10 Essential (primary) hypertension: Secondary | ICD-10-CM | POA: Insufficient documentation

## 2017-02-26 DIAGNOSIS — J449 Chronic obstructive pulmonary disease, unspecified: Secondary | ICD-10-CM | POA: Insufficient documentation

## 2017-02-26 DIAGNOSIS — M79601 Pain in right arm: Secondary | ICD-10-CM | POA: Diagnosis present

## 2017-02-26 MED ORDER — MELOXICAM 15 MG PO TABS: 15.0000 mg | ORAL_TABLET | Freq: Every day | ORAL | 0 refills | Status: DC

## 2017-02-26 MED ORDER — KETOROLAC TROMETHAMINE 60 MG/2ML IM SOLN
60.0000 mg | Freq: Once | INTRAMUSCULAR | Status: AC
  Administered 2017-02-26: 60 mg via INTRAMUSCULAR
  Filled 2017-02-26: qty 2

## 2017-02-26 NOTE — ED Triage Notes (Signed)
Right arm pain x 1.5 months, states she cannot take the pain any longer. States she has an appt with Dr. Marry Guan in July but cannot wait. States the pain is throbbing.

## 2017-02-26 NOTE — ED Notes (Signed)
Pt left without receiving papers. Pt came out to hallway, asking when she was going to get her paperwork. This RN explained to pt that Jinny Blossom, RN had given her medicine, and was on a med hold. I stated, " I will check with Jinny Blossom and get your papers." Pt stated she is not waiting and stormed off with discharge paperwork or prescription.

## 2017-02-26 NOTE — ED Provider Notes (Signed)
[facility] Emergency Department Provider Note  ____________________________________________  Time seen: Approximately 5:52 PM  I have reviewed the triage vital signs and the nursing notes.   HISTORY  Chief Complaint Arm Pain    HPI Melanie Bowen is a 56 y.o. female who presents emergency department for continued right upper arm pain. Patient reports that she has a burning/throbbing sensation to the posterior aspect of her humerus. Patient denies any trauma precipitating this pain. Patient reports that it is constant, worse with extreme range of motion. Patient reports that over time, she is unable to extend her right arm behind her back due to the pain. She denies any numbness and tingling in her right hand. Patient denies any headaches, visual changes, chest pain, shortness of breath. No medications prior to arrival. Patient is on chronic narcotic pain medication for degenerative disc disease. Patient reports that she has an appointment to see orthopedics in 2-3 weeks but "couldn't wait that long."   Past Medical History:  Diagnosis Date  . Allergic rhinitis   . Arthritis    DDD  . Asthma   . Blepharitis   . Bronchitis   . Cigarette smoker   . Claustrophobia   . COPD (chronic obstructive pulmonary disease) ([place])   . Cough   . Depression   . Eye irritation   . Genital herpes   . GERD (gastroesophageal reflux disease)   . Headache   . HIV (human immunodeficiency virus infection) ([place])   . Hypertension   . Paresthesia of hand, bilateral   . Shortness of breath dyspnea    at times due to asthma    Patient Active Problem List   Diagnosis Date Noted  . Community acquired pneumonia 11/28/2016  . Pneumonia 11/28/2016  . Allergic rhinitis 05/27/2016  . Tobacco abuse counseling 02/11/2016  . Symptomatic anemia   . Lower GI bleed   . Anemia 01/16/2016  . GI bleed 01/10/2016  . GERD (gastroesophageal reflux disease) 01/10/2016  . HTN  (hypertension) 01/10/2016  . Depression 01/10/2016  . HIV (human immunodeficiency virus infection) ([place]) 01/10/2016  . Lumbar pseudoarthrosis 10/31/2015  . Chronic cough   . Cough 06/19/2015  . DDD (degenerative disc disease), lumbar 12/31/2014  . Degenerative disc disease, lumbar 12/31/2014  . Asthma, chronic 11/12/2014    Past Surgical History:  Procedure Laterality Date  . BACK SURGERY    . back surgery may 2016     lumbar   . BUNIONECTOMY Bilateral    feet  . DIAGNOSTIC LAPAROSCOPY    . ESOPHAGOGASTRODUODENOSCOPY Left 01/11/2016   Procedure: ESOPHAGOGASTRODUODENOSCOPY (EGD);  Surgeon: Hulen Luster, MD;  Location: [facility] ENDOSCOPY;  Service: Endoscopy;  Laterality: Left;  . Finger fracture Right    5th finger  . HAND SURGERY Bilateral    carpal tunnel  . JOINT REPLACEMENT Bilateral    knees  . KNEE SURGERY Bilateral 2003,2007   total Joint  . LAPAROSCOPY FOR ECTOPIC PREGNANCY    . PERIPHERAL VASCULAR CATHETERIZATION N/A 01/12/2016   Procedure: Visceral Angiography;  Surgeon: Algernon Huxley, MD;  Location: [place] [facility];  Service: Cardiovascular;  Laterality: N/A;  . PERIPHERAL VASCULAR CATHETERIZATION N/A 01/12/2016   Procedure: Visceral Artery Intervention;  Surgeon: Algernon Huxley, MD;  Location: [place] [facility];  Service: Cardiovascular;  Laterality: N/A;  . TUBAL LIGATION    . VIDEO BRONCHOSCOPY N/A 06/30/2015   Procedure: VIDEO BRONCHOSCOPY WITHOUT FLUORO;  Surgeon: Vilinda Boehringer, MD;  Location: [facility] ORS;  Service: Cardiopulmonary;  Laterality: N/A;    Prior to Admission medications   Medication Sig Start Date End Date Taking? Authorizing Provider  albuterol (PROVENTIL HFA;VENTOLIN HFA) 108 (90 BASE) MCG/ACT inhaler Inhale 2 puffs into the lungs every 6 (six) hours as needed for wheezing or shortness of breath.     [provider]  amLODipine (NORVASC) 10 MG tablet Take 10 mg by mouth daily.     [provider]  amoxicillin-clavulanate (AUGMENTIN)  875-125 MG tablet Take 1 tablet by mouth 2 (two) times daily. 12/01/16   Max Sane, MD  benzonatate (TESSALON PERLES) 100 MG capsule Take 1 capsule (100 mg total) by mouth every 6 (six) hours as needed for cough. 02/07/17 02/07/18  Nance Pear, MD  dicyclomine (BENTYL) 10 MG capsule Take 1 capsule (10 mg total) by mouth 3 (three) times daily as needed (Abdominal cramping). Patient not taking: Reported on 11/28/2016 01/18/16   Max Sane, MD  efavirenz-emtricitabine-tenofovir (ATRIPLA) 600-200-300 MG per tablet Take 1 tablet by mouth at bedtime.    [provider]  Fluticasone-Salmeterol (ADVAIR DISKUS) 500-50 MCG/DOSE AEPB Inhale 1 puff into the lungs 2 (two) times daily. 05/27/16   Mungal, Vishal, MD  GENVOYA 150-150-200-10 MG TABS tablet Take 1 tablet by mouth daily. 11/09/16   [provider]  guaiFENesin-codeine 100-10 MG/5ML syrup Take 5 mLs by mouth every 6 (six) hours as needed for cough. 12/23/16   Harvest Dark, MD  hydrochlorothiazide (HYDRODIURIL) 25 MG tablet Take 25 mg by mouth daily.    [provider]  ipratropium-albuterol (DUONEB) 0.5-2.5 (3) MG/3ML SOLN Inhale 3 mLs into the lungs every 6 (six) hours as needed (for wheezing/shortness of breath).     [provider]  losartan (COZAAR) 50 MG tablet Take 50 mg by mouth daily.    [provider]  magic mouthwash SOLN Take 5 mLs by mouth 4 (four) times daily as needed for mouth pain. 12/01/16   Max Sane, MD  meloxicam (MOBIC) 15 MG tablet Take 1 tablet (15 mg total) by mouth daily. 02/26/17   Lorris Carducci, Charline Bills, PA-C  omeprazole (PRILOSEC) 40 MG capsule Take 40 mg by mouth daily.     [provider]  oxyCODONE (ROXICODONE) 15 MG immediate release tablet Take 15 mg by mouth every 6 (six) hours as needed for pain.    [provider]  polyethylene glycol (MIRALAX / GLYCOLAX) packet Take 17 g by mouth daily. 12/01/16   Max Sane, MD  predniSONE (DELTASONE) 20 MG tablet Take  2 tablets (40 mg total) by mouth daily. 02/07/17   Nance Pear, MD  promethazine (PHENERGAN) 25 MG tablet Take 25 mg by mouth every 6 (six) hours as needed for nausea or vomiting.     [provider]  valACYclovir (VALTREX) 500 MG tablet Take 500 mg by mouth 2 (two) times daily.    [provider]    Allergies Morphine and related; Gabapentin; and Zanaflex  [tizanidine]  Family History  Problem Relation Age of Onset  . Breast cancer Sister     Social History Social History  Substance Use Topics  . Smoking status: Former Smoker    Years: 38.00    Types: Cigarettes  . Smokeless tobacco: Never Used     Comment: 1 cig daily  . Alcohol use 3.0 oz/week    5 Glasses of wine per week     Comment: wine - couple days of week     Review of Systems  Constitutional: No fever/chills Eyes: No visual  changes. Cardiovascular: no chest pain. Respiratory: no cough. No SOB. Gastrointestinal: No abdominal pain.  No nausea, no vomiting.   Musculoskeletal: Positive for right upper arm pain Skin: Negative for rash, abrasions, lacerations, ecchymosis. Neurological: Negative for headaches, focal weakness or numbness. 10-point ROS otherwise negative.  ____________________________________________   PHYSICAL EXAM:  VITAL SIGNS: ED Triage Vitals  Enc Vitals Group     BP 02/26/17 1728 (!) 147/69     Pulse Rate 02/26/17 1728 99     Resp 02/26/17 1728 18     Temp 02/26/17 1728 99.3 F (37.4 C)     Temp Source 02/26/17 1728 Oral     SpO2 02/26/17 1728 98 %     Weight 02/26/17 1728 300 lb (136.1 kg)     Height 02/26/17 1728 5\' 6"  (1.676 m)     Head Circumference --      Peak Flow --      Pain Score 02/26/17 1726 10     Pain Loc --      Pain Edu? --      Excl. in [place]? --      Constitutional: Alert and oriented. Well appearing and in no acute distress. Eyes: Conjunctivae are normal. PERRL. EOMI. Head: Atraumatic. Neck: No stridor.  No cervical spine tenderness to  palpation.  Cardiovascular: Normal rate, regular rhythm. Normal S1 and S2.  Good peripheral circulation. Respiratory: Normal respiratory effort without tachypnea or retractions. Lungs CTAB. Good air entry to the bases with no decreased or absent breath sounds. Musculoskeletal:Limited range of motion to the right upper extremity due to pain. Patient reports tenderness to palpation over the posterior triceps region. No palpable abnormality. Examination of the elbow and shoulder is unremarkable. Patient does have increase in sensation of pain to her right upper extremity with palpation over the triangle auscultation just inferior to right scapula. Radial pulse intact. Sensation intact all 5 digits right hand. Neurologic:  Normal speech and language. No gross focal neurologic deficits are appreciated.  Skin:  Skin is warm, dry and intact. No rash noted. Psychiatric: Mood and affect are normal. Speech and behavior are normal. Patient exhibits appropriate insight and judgement.   ____________________________________________   LABS (all labs ordered are listed, but only abnormal results are displayed)  Labs Reviewed - No data to display ____________________________________________  EKG   ____________________________________________  RADIOLOGY   No results found.  ____________________________________________    PROCEDURES  Procedure(s) performed:    Procedures    Medications  ketorolac (TORADOL) injection 60 mg (60 mg Intramuscular Given 02/26/17 1820)     ____________________________________________   INITIAL IMPRESSION / ASSESSMENT AND PLAN / ED COURSE  Pertinent labs & imaging results that were available during my care of the patient were reviewed by me and considered in my medical decision making (see chart for details).  Review of the [place] CSRS was performed in accordance of the [place] prior to dispensing any controlled drugs.     Patient's diagnosis is consistent  with right-sided cervical radiculopathy. Patient has pain that is increased with palpation over cervical nerve distribution. No trauma or injury. No palpable abnormality. At this time, imaging is not warranted as there is no concerning physical exam findings warranting further investigation. Patient is on chronic narcotic pain medication for degenerative disc disease.. Patient will be discharged home with prescriptions for anti-inflammatories for symptom control. Patient is to follow up with orthopedics as needed or otherwise directed. Patient is given ED precautions to return to the ED for any worsening  or new symptoms.     ____________________________________________  FINAL CLINICAL IMPRESSION(S) / ED DIAGNOSES  Final diagnoses:  Cervical radiculopathy      NEW MEDICATIONS STARTED DURING THIS VISIT:  New Prescriptions   MELOXICAM (MOBIC) 15 MG TABLET    Take 1 tablet (15 mg total) by mouth daily.        This chart was dictated using voice recognition software/Dragon. Despite best efforts to proofread, errors can occur which can change the meaning. Any change was purely unintentional.    Darletta Moll, PA-C 02/26/17 1823    Nance Pear, MD 02/26/17 [phone]

## 2017-03-09 ENCOUNTER — Other Ambulatory Visit: Payer: Self-pay | Admitting: Orthopedic Surgery

## 2017-03-09 DIAGNOSIS — M542 Cervicalgia: Principal | ICD-10-CM

## 2017-03-09 DIAGNOSIS — G8929 Other chronic pain: Secondary | ICD-10-CM

## 2017-03-14 ENCOUNTER — Emergency Department: Payer: Medicare Other

## 2017-03-14 ENCOUNTER — Emergency Department
Admission: EM | Admit: 2017-03-14 | Discharge: 2017-03-14 | Disposition: A | Discharge status: Home / Self Care | Payer: Medicare Other | Attending: Emergency Medicine | Admitting: Emergency Medicine

## 2017-03-14 ENCOUNTER — Encounter: Payer: Self-pay | Admitting: Emergency Medicine

## 2017-03-14 DIAGNOSIS — R2240 Localized swelling, mass and lump, unspecified lower limb: Secondary | ICD-10-CM | POA: Diagnosis not present

## 2017-03-14 DIAGNOSIS — Z96659 Presence of unspecified artificial knee joint: Secondary | ICD-10-CM | POA: Diagnosis not present

## 2017-03-14 DIAGNOSIS — I10 Essential (primary) hypertension: Secondary | ICD-10-CM | POA: Insufficient documentation

## 2017-03-14 DIAGNOSIS — J4521 Mild intermittent asthma with (acute) exacerbation: Secondary | ICD-10-CM | POA: Diagnosis not present

## 2017-03-14 DIAGNOSIS — J40 Bronchitis, not specified as acute or chronic: Secondary | ICD-10-CM

## 2017-03-14 DIAGNOSIS — J449 Chronic obstructive pulmonary disease, unspecified: Secondary | ICD-10-CM | POA: Insufficient documentation

## 2017-03-14 DIAGNOSIS — Z8701 Personal history of pneumonia (recurrent): Secondary | ICD-10-CM | POA: Diagnosis not present

## 2017-03-14 DIAGNOSIS — R05 Cough: Secondary | ICD-10-CM | POA: Diagnosis present

## 2017-03-14 DIAGNOSIS — B20 Human immunodeficiency virus [HIV] disease: Secondary | ICD-10-CM | POA: Insufficient documentation

## 2017-03-14 DIAGNOSIS — F17210 Nicotine dependence, cigarettes, uncomplicated: Secondary | ICD-10-CM | POA: Insufficient documentation

## 2017-03-14 LAB — CBC
HCT: 33.1 % — ABNORMAL LOW (ref 35.0–47.0)
Hemoglobin: 10.7 g/dL — ABNORMAL LOW (ref 12.0–16.0)
MCH: 24.8 pg — ABNORMAL LOW (ref 26.0–34.0)
MCHC: 32.4 g/dL (ref 32.0–36.0)
MCV: 76.6 fL — ABNORMAL LOW (ref 80.0–100.0)
Platelets: 426 10*3/uL (ref 150–440)
RBC: 4.32 MIL/uL (ref 3.80–5.20)
RDW: 18.5 % — ABNORMAL HIGH (ref 11.5–14.5)
WBC: 6.6 10*3/uL (ref 3.6–11.0)

## 2017-03-14 LAB — TROPONIN I: Troponin I: <0.03 ng/mL (ref <0.03)

## 2017-03-14 LAB — COMPREHENSIVE METABOLIC PANEL
ALT: 15 U/L (ref 14–54)
AST: 25 U/L (ref 15–41)
Albumin: 3.4 g/dL — ABNORMAL LOW (ref 3.5–5.0)
Alkaline Phosphatase: 97 U/L (ref 38–126)
Anion gap: 10 (ref 5–15)
BUN: 17 mg/dL (ref 6–20)
CO2: 21 mmol/L — ABNORMAL LOW (ref 22–32)
Calcium: 8.9 mg/dL (ref 8.9–10.3)
Chloride: 106 mmol/L (ref 101–111)
Creatinine, Ser: 1.06 mg/dL — ABNORMAL HIGH (ref 0.44–1.00)
GFR calc Af Amer: >60 mL/min (ref >60)
GFR calc non Af Amer: 58 mL/min — ABNORMAL LOW (ref >60)
Glucose, Bld: 121 mg/dL — ABNORMAL HIGH (ref 65–99)
Potassium: 3.2 mmol/L — ABNORMAL LOW (ref 3.5–5.1)
Sodium: 137 mmol/L (ref 135–145)
Total Bilirubin: 0.3 mg/dL (ref 0.3–1.2)
Total Protein: 7.1 g/dL (ref 6.5–8.1)

## 2017-03-14 MED ORDER — IPRATROPIUM-ALBUTEROL 0.5-2.5 (3) MG/3ML IN SOLN
3.0000 mL | Freq: Once | RESPIRATORY_TRACT | Status: AC
  Administered 2017-03-14: 3 mL via RESPIRATORY_TRACT
  Filled 2017-03-14 (×2): qty 3

## 2017-03-14 MED ORDER — PREDNISONE 10 MG (21) PO TBPK: ORAL_TABLET | Freq: Every day | ORAL | 0 refills | Status: DC

## 2017-03-14 MED ORDER — PREDNISONE 20 MG PO TABS
60.0000 mg | ORAL_TABLET | Freq: Once | ORAL | Status: AC
  Administered 2017-03-14: 60 mg via ORAL
  Filled 2017-03-14: qty 3

## 2017-03-14 MED ORDER — HYDROCOD POLST-CPM POLST ER 10-8 MG/5ML PO SUER: 5.0000 mL | Freq: Two times a day (BID) | ORAL | 0 refills | Status: DC

## 2017-03-14 NOTE — ED Provider Notes (Signed)
[facility] Emergency Department Provider Note       Time seen: ----------------------------------------- 6:13 PM on 03/14/2017 -----------------------------------------     I have reviewed the triage vital signs and the nursing notes.   HISTORY   Chief Complaint Cough; Leg Swelling; and Chest Pain    HPI Melanie Bowen is a 56 y.o. female who presents to the ED for cough and chest pain for the last week. Patient presents with fevers, chills, cough, chest pain, shortness of breath and diarrhea. Patient reports symptoms on and off over the last week. The makes her symptoms better, she has used her inhaler several times a day without significant improvement. She reports recent history of pneumonia.   Past Medical History:  Diagnosis Date  . Allergic rhinitis   . Arthritis    DDD  . Asthma   . Blepharitis   . Bronchitis   . Cigarette smoker   . Claustrophobia   . COPD (chronic obstructive pulmonary disease) ([place])   . Cough   . Depression   . Eye irritation   . Genital herpes   . GERD (gastroesophageal reflux disease)   . Headache   . HIV (human immunodeficiency virus infection) ([place])   . Hypertension   . Paresthesia of hand, bilateral   . Shortness of breath dyspnea    at times due to asthma    Patient Active Problem List   Diagnosis Date Noted  . Community acquired pneumonia 11/28/2016  . Pneumonia 11/28/2016  . Allergic rhinitis 05/27/2016  . Tobacco abuse counseling 02/11/2016  . Symptomatic anemia   . Lower GI bleed   . Anemia 01/16/2016  . GI bleed 01/10/2016  . GERD (gastroesophageal reflux disease) 01/10/2016  . HTN (hypertension) 01/10/2016  . Depression 01/10/2016  . HIV (human immunodeficiency virus infection) ([place]) 01/10/2016  . Lumbar pseudoarthrosis 10/31/2015  . Chronic cough   . Cough 06/19/2015  . DDD (degenerative disc disease), lumbar 12/31/2014  . Degenerative disc disease, lumbar 12/31/2014  . Asthma,  chronic 11/12/2014    Past Surgical History:  Procedure Laterality Date  . BACK SURGERY    . back surgery may 2016     lumbar   . BUNIONECTOMY Bilateral    feet  . DIAGNOSTIC LAPAROSCOPY    . ESOPHAGOGASTRODUODENOSCOPY Left 01/11/2016   Procedure: ESOPHAGOGASTRODUODENOSCOPY (EGD);  Surgeon: Hulen Luster, MD;  Location: [facility] ENDOSCOPY;  Service: Endoscopy;  Laterality: Left;  . Finger fracture Right    5th finger  . HAND SURGERY Bilateral    carpal tunnel  . JOINT REPLACEMENT Bilateral    knees  . KNEE SURGERY Bilateral 2003,2007   total Joint  . LAPAROSCOPY FOR ECTOPIC PREGNANCY    . PERIPHERAL VASCULAR CATHETERIZATION N/A 01/12/2016   Procedure: Visceral Angiography;  Surgeon: Algernon Huxley, MD;  Location: [place] [facility];  Service: Cardiovascular;  Laterality: N/A;  . PERIPHERAL VASCULAR CATHETERIZATION N/A 01/12/2016   Procedure: Visceral Artery Intervention;  Surgeon: Algernon Huxley, MD;  Location: [place] [facility];  Service: Cardiovascular;  Laterality: N/A;  . TUBAL LIGATION    . VIDEO BRONCHOSCOPY N/A 06/30/2015   Procedure: VIDEO BRONCHOSCOPY WITHOUT FLUORO;  Surgeon: Vilinda Boehringer, MD;  Location: [facility] ORS;  Service: Cardiopulmonary;  Laterality: N/A;    Allergies Morphine and related; Gabapentin; and Zanaflex  [tizanidine]  Social History Social History  Substance Use Topics  . Smoking status: Current Some Day Smoker    Years: 38.00    Types: Cigarettes  . Smokeless  tobacco: Never Used     Comment: 1 cig daily  . Alcohol use 3.0 oz/week    5 Glasses of wine per week     Comment: wine - couple days of week    Review of Systems Constitutional: Positive for fevers and chills Eyes: Negative for vision changes ENT:  Negative for congestion, positive for sore throat Cardiovascular: Positive for chest pain Respiratory: Positive for shortness of breath and cough Gastrointestinal: Negative for abdominal pain, vomiting and diarrhea. Genitourinary: Negative for  dysuria. Musculoskeletal: Negative for back pain. Positive for leg swelling Skin: Negative for rash. Neurological: Negative for headaches, focal weakness or numbness.  All systems negative/normal/unremarkable except as stated in the HPI  ____________________________________________   PHYSICAL EXAM:  VITAL SIGNS: ED Triage Vitals  Enc Vitals Group     BP 03/14/17 1729 (!) 150/74     Pulse Rate 03/14/17 1729 91     Resp 03/14/17 1729 20     Temp 03/14/17 1729 98.4 F (36.9 C)     Temp Source 03/14/17 1729 Oral     SpO2 03/14/17 1729 97 %     Weight 03/14/17 1730 300 lb (136.1 kg)     Height 03/14/17 1730 5\' 6"  (1.676 m)     Head Circumference --      Peak Flow --      Pain Score 03/14/17 1728 9     Pain Loc --      Pain Edu? --      Excl. in [place]? --     Constitutional: Alert and oriented. Well appearing and in no distress. Eyes: Conjunctivae are normal. Normal extraocular movements. ENT   Head: Normocephalic and atraumatic.   Nose: No congestion/rhinnorhea.   Mouth/Throat: Mucous membranes are moist.   Neck: No stridor. Cardiovascular: Normal rate, regular rhythm. No murmurs, rubs, or gallops. Respiratory: Normal respiratory effort without tachypnea nor retractions. Bilateral wheezing and rhonchi Gastrointestinal: Soft and nontender. Normal bowel sounds Musculoskeletal: Nontender with normal range of motion in extremities. Minimal edema is appreciated around the ankles Neurologic:  Normal speech and language. No gross focal neurologic deficits are appreciated.  Skin:  Skin is warm, dry and intact. No rash noted. Psychiatric: Mood and affect are normal. Speech and behavior are normal.  ____________________________________________  EKG: Interpreted by me.Sinus rhythm with a rate of 93 bpm, normal PR and normal, normal QRS, normal QT.  ____________________________________________  ED COURSE:  Pertinent labs & imaging results that were available during my  care of the patient were reviewed by me and considered in my medical decision making (see chart for details). Patient presents for cough, wheezing and chest pain, we will assess with labs and imaging as indicated.   Procedures ____________________________________________   LABS (pertinent positives/negatives)  Labs Reviewed  CBC - Abnormal; Notable for the following:       Result Value   Hemoglobin 10.7 (*)    HCT 33.1 (*)    MCV 76.6 (*)    MCH 24.8 (*)    RDW 18.5 (*)    All other components within normal limits  COMPREHENSIVE METABOLIC PANEL - Abnormal; Notable for the following:    Potassium 3.2 (*)    CO2 21 (*)    Glucose, Bld 121 (*)    Creatinine, Ser 1.06 (*)    Albumin 3.4 (*)    GFR calc non Af Amer 58 (*)    All other components within normal limits  TROPONIN I    RADIOLOGY  Chest x-ray  IMPRESSION: Mild bronchitic changes. No focal consolidation.  ____________________________________________  FINAL ASSESSMENT AND PLAN  Asthma exacerbation, bronchitis  Plan: Patient's labs and imaging were dictated above. Patient had presented for cough which appears to be asthmatic bronchitis. She was given DuoNeb since steroids. Overall her lung sounds are improved and tests are unremarkable. She is stable for outpatient follow-up.   Earleen Newport, MD   Note: This note was generated in part or whole with voice recognition software. Voice recognition is usually quite accurate but there are transcription errors that can and very often do occur. I apologize for any typographical errors that were not detected and corrected.     Earleen Newport, MD 03/14/17 [phone]

## 2017-03-14 NOTE — ED Notes (Signed)
Pt c/o cough with congestion and wheezing for the past 2 weeks, states she has been taking albuterol neb tx at home with no relief, when asked if she has taken any decongestants pt states "no those dont work, Im immune".. Pt also reports Bl LE swelling. Pt is in NAD.Marland Kitchen

## 2017-03-14 NOTE — ED Triage Notes (Signed)
States cough and chest pain x 1 week, leg edema x 4 days.

## 2017-03-25 ENCOUNTER — Other Ambulatory Visit: Payer: Self-pay | Admitting: Family Medicine

## 2017-03-25 ENCOUNTER — Ambulatory Visit
Admission: RE | Admit: 2017-03-25 | Discharge: 2017-03-25 | Disposition: A | Discharge status: Home / Self Care | Payer: Medicare Other | Source: Ambulatory Visit | Attending: Orthopedic Surgery | Admitting: Orthopedic Surgery

## 2017-03-25 DIAGNOSIS — M542 Cervicalgia: Principal | ICD-10-CM

## 2017-03-25 DIAGNOSIS — G8929 Other chronic pain: Secondary | ICD-10-CM

## 2017-03-25 DIAGNOSIS — Z1231 Encounter for screening mammogram for malignant neoplasm of breast: Secondary | ICD-10-CM

## 2017-04-07 ENCOUNTER — Ambulatory Visit
Payer: Medicare Other | Attending: Student in an Organized Health Care Education/Training Program | Admitting: Student in an Organized Health Care Education/Training Program

## 2017-04-07 ENCOUNTER — Encounter: Payer: Self-pay | Admitting: Student in an Organized Health Care Education/Training Program

## 2017-04-07 VITALS — BP 142/6 | HR 96 | Temp 98.2°F | Resp 20 | Ht 66.0 in | Wt 302.0 lb

## 2017-04-07 DIAGNOSIS — K219 Gastro-esophageal reflux disease without esophagitis: Secondary | ICD-10-CM | POA: Insufficient documentation

## 2017-04-07 DIAGNOSIS — F17210 Nicotine dependence, cigarettes, uncomplicated: Secondary | ICD-10-CM | POA: Insufficient documentation

## 2017-04-07 DIAGNOSIS — Z6841 Body Mass Index (BMI) 40.0 and over, adult: Secondary | ICD-10-CM | POA: Insufficient documentation

## 2017-04-07 DIAGNOSIS — M5136 Other intervertebral disc degeneration, lumbar region: Secondary | ICD-10-CM | POA: Insufficient documentation

## 2017-04-07 DIAGNOSIS — M542 Cervicalgia: Secondary | ICD-10-CM

## 2017-04-07 DIAGNOSIS — G629 Polyneuropathy, unspecified: Secondary | ICD-10-CM | POA: Diagnosis not present

## 2017-04-07 DIAGNOSIS — B20 Human immunodeficiency virus [HIV] disease: Secondary | ICD-10-CM | POA: Insufficient documentation

## 2017-04-07 DIAGNOSIS — M4722 Other spondylosis with radiculopathy, cervical region: Secondary | ICD-10-CM | POA: Insufficient documentation

## 2017-04-07 DIAGNOSIS — F40240 Claustrophobia: Secondary | ICD-10-CM | POA: Insufficient documentation

## 2017-04-07 DIAGNOSIS — I10 Essential (primary) hypertension: Secondary | ICD-10-CM | POA: Diagnosis not present

## 2017-04-07 DIAGNOSIS — M4712 Other spondylosis with myelopathy, cervical region: Secondary | ICD-10-CM | POA: Insufficient documentation

## 2017-04-07 DIAGNOSIS — Z79899 Other long term (current) drug therapy: Secondary | ICD-10-CM | POA: Insufficient documentation

## 2017-04-07 DIAGNOSIS — F329 Major depressive disorder, single episode, unspecified: Secondary | ICD-10-CM | POA: Diagnosis not present

## 2017-04-07 DIAGNOSIS — J449 Chronic obstructive pulmonary disease, unspecified: Secondary | ICD-10-CM | POA: Insufficient documentation

## 2017-04-07 NOTE — Progress Notes (Signed)
Patient's Name: Melanie Bowen  MRN: [id number]  Referring Provider: Dereck Leep, MD  DOB: 16-Sep-1960  PCP: Theotis Burrow, MD  DOS: 04/07/2017  Note by: Gillis Santa, MD  Service setting: Ambulatory outpatient  Specialty: Interventional Pain Management  Location: [facility] (AMB) Pain Management Facility    Patient type: New patient ("FAST-TRACK" Evaluation)   Warning: This referral option does not include the extensive pharmacological evaluation required for [place] to take over the patient's medication management. The "Fast-Track" system is designed to bypass the new patient referral waiting list, as well as the normal patient evaluation process, in order to provide a patient in distress with a timely pain management intervention. Because the system was not designed to unfairly get a patient into our pain practice ahead of those already waiting, certain restrictions apply. By requesting a "Fast-Track" consult, the referring physician has opted to continue managing the patient's medications in order to get interventional urgent care.  Primary Reason for Visit: Interventional Pain Management Treatment. CC: Neck Pain   HPI  Melanie Bowen is a 56 y.o. year old, female patient, who comes today for a  "Fast-Track" new patient evaluation, as requested by Dereck Leep, MD. The patient has been made aware that this type of referral option is reserved for the Interventional Pain Management portion of our practice and completely excludes the option of medication management. Her primarily concern today is the Neck Pain  Pain Assessment: Location: Right Neck Radiating: right arm, down to the hand, with tingling Onset: More than a month ago Duration: Chronic pain Quality: Sharp, Aching Severity: 9 /10 (self-reported pain score)  Note: Reported level is compatible with observation.                   Effect on ADL: unable to do daily activities Timing: Intermittent Modifying factors: nothing  Onset  and Duration: Sudden Cause of pain: Unknown Severity: Getting worse, NAS-11 at its worse: 9/10, NAS-11 at its best: 5/10, NAS-11 now: 9/10 and NAS-11 on the average: 8/10 Timing: Not influenced by the time of the day Aggravating Factors: Bending, Motion, Prolonged sitting, Prolonged standing, Twisting, Walking, Walking uphill and Walking downhill Alleviating Factors: Cold packs, Hot packs and Lying down Associated Problems: Day-time cramps, Night-time cramps, Fatigue, Numbness, Tingling and Pain that wakes patient up Quality of Pain: Aching, Cramping, Stabbing, Throbbing and Tingling Previous Examinations or Tests: CT scan and MRI scan Previous Treatments: Narcotic medications  The patient comes into the clinic today, referred to [place] for a right cervical facet injections.   Briefly, patient is a 56 year old female with a past medical history of morbid obesity, lumbar degenerative disc disease, lumbar pseudoarthrosis, GERD, hypertension, HIV status, depression with neck pain that started in approximately July with radiation down to the patient's right arm. Patient endorses occasional numbness and tingling in her right fingers and occasional weakness at times. Of note patient did sustain a fall at the grocery store last night, 8-8, resulting in left-sided shoulder pain.  Meds   Current Meds  Medication Sig  . albuterol (PROVENTIL HFA;VENTOLIN HFA) 108 (90 BASE) MCG/ACT inhaler Inhale 2 puffs into the lungs every 6 (six) hours as needed for wheezing or shortness of breath.   Marland Kitchen amLODipine (NORVASC) 10 MG tablet Take 10 mg by mouth daily.   . benzonatate (TESSALON PERLES) 100 MG capsule Take 1 capsule (100 mg total) by mouth every 6 (six) hours as needed for cough.  . Fluticasone-Salmeterol (ADVAIR DISKUS) 500-50 MCG/DOSE AEPB Inhale 1  puff into the lungs 2 (two) times daily.  . GENVOYA 150-150-200-10 MG TABS tablet Take 1 tablet by mouth daily.  . hydrochlorothiazide (HYDRODIURIL) 25 MG tablet  Take 25 mg by mouth daily.  Marland Kitchen ipratropium-albuterol (DUONEB) 0.5-2.5 (3) MG/3ML SOLN Inhale 3 mLs into the lungs every 6 (six) hours as needed (for wheezing/shortness of breath).   . losartan (COZAAR) 50 MG tablet Take 50 mg by mouth daily.  Marland Kitchen omeprazole (PRILOSEC) 40 MG capsule Take 40 mg by mouth daily.   Marland Kitchen oxyCODONE (ROXICODONE) 15 MG immediate release tablet Take 15 mg by mouth every 6 (six) hours as needed for pain.  . promethazine (PHENERGAN) 25 MG tablet Take 25 mg by mouth every 6 (six) hours as needed for nausea or vomiting.   . valACYclovir (VALTREX) 500 MG tablet Take 500 mg by mouth 2 (two) times daily.    Imaging Review  Cervical Imaging: Cervical MR wo contrast:  Results for orders placed during the hospital encounter of 03/25/17  MR CERVICAL SPINE WO CONTRAST   Narrative CLINICAL DATA:  Neck pain radiating to the right arm for 2 months. Right arm weakness.  EXAM: MRI CERVICAL SPINE WITHOUT CONTRAST  TECHNIQUE: Multiplanar, multisequence MR imaging of the cervical spine was performed. No intravenous contrast was administered.  COMPARISON:  None.  FINDINGS: Alignment: Physiologic.  Vertebrae: No fracture, evidence of discitis, or bone lesion.  Cord: Normal signal and morphology.  Posterior Fossa, vertebral arteries, paraspinal tissues: Posterior fossa demonstrates no focal abnormality. Vertebral artery flow voids are maintained. 1.5 x 3.3 cm lipoma in the right levator scapulae muscle. Paraspinal soft tissues are otherwise unremarkable.  Disc levels:  Discs: Degenerative disc disease with disc height loss at C4-5, C5-6 and C6-7.  C2-3: No significant disc bulge. No neural foraminal stenosis. No central canal stenosis.  C3-4: Mild broad-based disc bulge. Severe left facet arthropathy. Mild left foraminal stenosis. No right foraminal stenosis. No central canal stenosis.  C4-5: Small central disc protrusion. Mild right foraminal stenosis. No left foraminal  stenosis. No central canal stenosis.  C5-6: Mild broad-based disc bulge. Bilateral uncovertebral degenerative changes. Severe right and mild left foraminal stenosis. No central canal stenosis.  C6-7: Broad-based disc bulge. No neural foraminal stenosis. No central canal stenosis.  C7-T1: No significant disc bulge. No neural foraminal stenosis. No central canal stenosis.  IMPRESSION: 1. At C3-4 there is a mild broad-based disc bulge. Severe left facet arthropathy. Mild left foraminal stenosis. 2. At C4-5 there is a small central disc protrusion. Mild right foraminal stenosis. 3. At C5-6 there is a mild broad-based disc bulge. Bilateral uncovertebral degenerative changes. Severe right and mild left foraminal stenosis.   Electronically Signed   By: Kathreen Devoid   On: 03/25/2017 13:36    Cervical MR wo contrast:  Results for orders placed in visit on 03/05/09  [place] W/O Cm   Narrative  PRIOR REPORT IMPORTED FROM AN EXTERNAL SYSTEM   PRIOR REPORT IMPORTED FROM THE [facility] [place] EXAM:    neck and right arm pain  COMMENTS:  home [id number]   PROCEDURE:     MMR - MMR CERVICAL SPINE WO CONT  - Mar 05 2009  8:56AM   RESULT:     Sagittal and axial imaging was performed through the cervical  spine. The cervical vertebral bodies are preserved in height. There is  some  loss of the normal cervical lordosis. The craniocervical junction and  cervical spinal cord are  normal in signal intensity and position. Mild  ventral impressions are made upon the CSF filled thecal sac on the  sagittal  images at the C4 556 and 67 disc levels.   Axial imaging was performed from the inferior aspect of C2 to the upper  aspect of T1.    At the C2-C3 and the C3-C4 disc levels the AP dimension of the CSF filled  thecal sac measures between 11 and 13 mm. There are small central endplate  bars noted at both levels which do not efface CSF signal anterior to the  cord. The  neural foramina are patent at these levels.   At C4-C5 there is an annular disc bulge within an asymmetric central  endplate spur and disc bulge which decreases the AP dimension of the  thecal  sac to approximately 8 mm just to the left of midline. There is also mild  encroachment upon the right neural foramen due to to lateral disc bulging  and endplate bar formation.   At C5-C6 there is broadly based annular disc bulging asymmetric toward the  right. In the midline and to the right the AP dimension the thecal sac  measures between 8 and 9 mm. The neural foramina are patent.    At C6-C7 there is mild annular disc bulging. The AP dimension the thecal  sac is 10 mm and the neural foramina appear patent.    At C7-T1 the AP dimension of the thecal sac measures 12 mm and the neural  foramina are patent.   IMPRESSION:      There is degenerative disc change at the C4-C5 and C5-C6  and C6-C7 levels as described above. The minimal AP dimension of the CSF  filled thecal sac is noted at C4-C5 and C5-C6 where it measures as little  as  8 mm. There is neural foraminal encroachment on the right at C5-C6 and on  the left at C4-C5.       Lumbar MR wo contrast:  Results for orders placed in visit on 08/20/14  MR [facility] W/O Cm   Narrative  PRIOR REPORT IMPORTED FROM AN EXTERNAL SYSTEM   CLINICAL DATA:  Chronic low back pain. Bilateral leg pain, worse on  the right.   EXAM:  MRI LUMBAR SPINE WITHOUT CONTRAST   TECHNIQUE:  Multiplanar, multisequence MR imaging of the lumbar spine was  performed. No intravenous contrast was administered.   COMPARISON:  Report of MRI lumbar spine 06/02/2012 reviewed. Images  are not available.   FINDINGS:  Vertebral body height and alignment are maintained. There is some  degenerative endplate signal change at L3-4 and L4-5 but no  worrisome marrow lesion. The conus medullaris is normal in signal  and position. Imaged intra-abdominal contents are  unremarkable.   The T11-12 level is imaged in the sagittal plane only and negative.   T12-L1: Negative.   L1-2: Negative.   L2-3: Minimal disc bulge without central canal or foraminal  stenosis.   L3-4: Shallow broad-based disc bulge and endplate spurring cause  mild central canal and foraminal narrowing.   L4-5: Right worse than left facet degenerative changes and a shallow  broad-based disc bulge are identified. The central canal and  foramina are mildly narrowed.   L5-S1: Mild facet arthropathy and a shallow disc bulge without  central canal or foraminal stenosis.   IMPRESSION:  Mild degenerative disease most notable at L3-4 and L4-5 where  shallow disc bulges and facet arthropathy are identified. The  central  canal and foramina are mildly narrowed each level without  nerve root compression. No interval change based on comparison with  prior report.    Electronically Signed    By: Inge Rise M.D.    On: 08/20/2014 12:45        Lumbar CT wo contrast:  Results for orders placed during the hospital encounter of 07/26/16  CT LUMBAR SPINE WO CONTRAST   Narrative CLINICAL DATA:  Constant severe low back pain. History of lumbar fusion March 2017  EXAM: CT LUMBAR SPINE WITHOUT CONTRAST  TECHNIQUE: Multidetector CT imaging of the lumbar spine was performed without intravenous contrast administration. Multiplanar CT image reconstructions were also generated.  COMPARISON:  03/26/2016  FINDINGS: Segmentation: 5 lumbar type vertebral bodies. The last full intervertebral disc space is labeled L5-S1.  Alignment: Normal  Vertebrae: Stable surgical changes from posterior and interbody fusions at L3-4 and L4-5. The L3 pedicle screws appear to breach the superior endplate of L3 but this is a stable finding. No acute fractures identified. No solid interbody fusion changes are demonstrated.  Paraspinal and other soft tissues: No significant findings. Age advanced  atherosclerotic calcifications involving the aorta and iliac arteries are again demonstrated.  Disc levels: L1-2:  No significant findings.  L2-3: Mild diffuse annular bulge but no significant spinal or foraminal stenosis.  L3-4: Posterior and interbody fusion changes. No obvious spinal or foraminal stenosis. No definite solid interbody fusion changes.  L4-5: Surgical changes from posterior and interbody fusion. No complicating features. No solid interbody fusion changes.  L5-S1: Stable degenerative disc disease. No disc protrusions, spinal or foraminal stenosis. Stable right-sided facet disease.  IMPRESSION: Stable postoperative changes at L3-4 and L4-5 as discussed above. No solid interbody fusion changes are demonstrated.  Stable degenerative disc disease and facet disease at L5-S1   Electronically Signed   By: Marijo Sanes M.D.   On: 07/26/2016 10:11    Lumbar DG 2-3 views:  Results for orders placed during the hospital encounter of 10/31/15  DG Lumbar Spine 2-3 Views   Narrative CLINICAL DATA:  Lumbar spine surgery.  EXAM: DG C-ARM 61-120 MIN; LUMBAR SPINE - 2-3 VIEW  COMPARISON:  12/31/2014.  FINDINGS: Lumbar vertebra numbered with the lowest lumbar appearing vertebra on AP view is L5. L3 through L5 posterior interbody fusion. Hardware intact. No acute abnormality.  IMPRESSION: L3 through L5 posterior interbody fusion.  Hardware intact.   Electronically Signed   By: Marcello Moores  Register   On: 10/31/2015 09:50     Knee-L DG 1-2 views:  Results for orders placed in visit on 06/07/05  DG Knee 1-2 Views Left   Narrative  PRIOR REPORT IMPORTED FROM AN EXTERNAL SYSTEM  PRIOR REPORT IMPORTED FROM THE [facility] WORKFLOW SYSTEM   REASON FOR EXAM:  postop  COMMENTS:  Bedside (portable):Y   PROCEDURE:     DXR - DXR KNEE LEFT AP AND LATERAL  - Jun 07 2005  3:01PM   RESULT:       AP and lateral views of the LEFT knee reveal the patient to  have undergone total  knee joint replacement.  Radiographic positioning of  the prosthetic component is good. Orthopedic drain lines and skin staples  are present.   IMPRESSION:   The patient has undergone LEFT total knee joint replacement.  Further  interpretation is deferred to Dr. Marry Guan.   Thank you for this opportunity to contribute to the care of your patient.        Complexity Note: Imaging  results reviewed. Results discussed using Layman's terms.               ROS  Cardiovascular History: High blood pressure Pulmonary or Respiratory History: Shortness of breath Neurological History: No reported neurological signs or symptoms such as seizures, abnormal skin sensations, urinary and/or fecal incontinence, being born with an abnormal open spine and/or a tethered spinal cord Review of Past Neurological Studies: No results found for this or any previous visit. Psychological-Psychiatric History: No reported psychological or psychiatric signs or symptoms such as difficulty sleeping, anxiety, depression, delusions or hallucinations (schizophrenial), mood swings (bipolar disorders) or suicidal ideations or attempts Gastrointestinal History: Reflux or heatburn Genitourinary History: No reported renal or genitourinary signs or symptoms such as difficulty voiding or producing urine, peeing blood, non-functioning kidney, kidney stones, difficulty emptying the bladder, difficulty controlling the flow of urine, or chronic kidney disease Hematological History: No reported hematological signs or symptoms such as prolonged bleeding, low or poor functioning platelets, bruising or bleeding easily, hereditary bleeding problems, low energy levels due to low hemoglobin or being anemic Endocrine History: No reported endocrine signs or symptoms such as high or low blood sugar, rapid heart rate due to high thyroid levels, obesity or weight gain due to slow thyroid or thyroid disease Rheumatologic History: No reported  rheumatological signs and symptoms such as fatigue, joint pain, tenderness, swelling, redness, heat, stiffness, decreased range of motion, with or without associated rash Musculoskeletal History: Negative for myasthenia gravis, muscular dystrophy, multiple sclerosis or malignant hyperthermia Work History: Disabled  Allergies  Ms. Welby is allergic to morphine and related; gabapentin; and zanaflex  [tizanidine].  Laboratory Chemistry  Inflammation Markers (CRP: Acute Phase) (ESR: Chronic Phase) No results found for: CRP, ESRSEDRATE               Renal Function Markers Lab Results  Component Value Date   BUN 17 03/14/2017   CREATININE 1.06 (H) 03/14/2017   GFRAA >60 03/14/2017   GFRNONAA 58 (L) 03/14/2017                 Hepatic Function Markers Lab Results  Component Value Date   AST 25 03/14/2017   ALT 15 03/14/2017   ALBUMIN 3.4 (L) 03/14/2017   ALKPHOS 97 03/14/2017                 Electrolytes Lab Results  Component Value Date   NA 137 03/14/2017   K 3.2 (L) 03/14/2017   CL 106 03/14/2017   CALCIUM 8.9 03/14/2017                 Neuropathy Markers No results found for: [id number]               Bone Pathology Markers Lab Results  Component Value Date   ALKPHOS 97 03/14/2017   CALCIUM 8.9 03/14/2017                 Coagulation Parameters Lab Results  Component Value Date   PLT 426 03/14/2017                 Cardiovascular Markers Lab Results  Component Value Date   BNP 193.0 (H) 12/03/2016   HGB 10.7 (L) 03/14/2017   HCT 33.1 (L) 03/14/2017                 Note: Lab results reviewed.  PFSH  Drug: Ms. Butkiewicz  reports that she does not use drugs. Alcohol:  reports that she drinks about 3.0  oz of alcohol per week . Tobacco:  reports that she has been smoking Cigarettes.  She has smoked for the past 38.00 years. She has never used smokeless tobacco. Medical:  has a past medical history of Allergic rhinitis; Arthritis; Asthma; Blepharitis;  Bronchitis; Cigarette smoker; Claustrophobia; COPD (chronic obstructive pulmonary disease) ([place]); Cough; Depression; Eye irritation; Genital herpes; GERD (gastroesophageal reflux disease); Headache; HIV (human immunodeficiency virus infection) ([place]); Hypertension; Paresthesia of hand, bilateral; and Shortness of breath dyspnea. Family: family history includes Breast cancer in her sister.  Past Surgical History:  Procedure Laterality Date  . BACK SURGERY    . back surgery may 2016     lumbar   . BUNIONECTOMY Bilateral    feet  . DIAGNOSTIC LAPAROSCOPY    . ESOPHAGOGASTRODUODENOSCOPY Left 01/11/2016   Procedure: ESOPHAGOGASTRODUODENOSCOPY (EGD);  Surgeon: Hulen Luster, MD;  Location: [facility] ENDOSCOPY;  Service: Endoscopy;  Laterality: Left;  . Finger fracture Right    5th finger  . HAND SURGERY Bilateral    carpal tunnel  . JOINT REPLACEMENT Bilateral    knees  . KNEE SURGERY Bilateral 2003,2007   total Joint  . LAPAROSCOPY FOR ECTOPIC PREGNANCY    . PERIPHERAL VASCULAR CATHETERIZATION N/A 01/12/2016   Procedure: Visceral Angiography;  Surgeon: Algernon Huxley, MD;  Location: [place] CV LAB;  Service: Cardiovascular;  Laterality: N/A;  . PERIPHERAL VASCULAR CATHETERIZATION N/A 01/12/2016   Procedure: Visceral Artery Intervention;  Surgeon: Algernon Huxley, MD;  Location: [place] CV LAB;  Service: Cardiovascular;  Laterality: N/A;  . TUBAL LIGATION    . VIDEO BRONCHOSCOPY N/A 06/30/2015   Procedure: VIDEO BRONCHOSCOPY WITHOUT FLUORO;  Surgeon: Vilinda Boehringer, MD;  Location: [facility] ORS;  Service: Cardiopulmonary;  Laterality: N/A;   Active Ambulatory Problems    Diagnosis Date Noted  . Asthma, chronic 11/12/2014  . DDD (degenerative disc disease), lumbar 12/31/2014  . Degenerative disc disease, lumbar 12/31/2014  . Cough 06/19/2015  . Chronic cough   . Lumbar pseudoarthrosis 10/31/2015  . GI bleed 01/10/2016  . GERD (gastroesophageal reflux disease) 01/10/2016  . HTN (hypertension)  01/10/2016  . Depression 01/10/2016  . HIV (human immunodeficiency virus infection) ([place]) 01/10/2016  . Anemia 01/16/2016  . Symptomatic anemia   . Lower GI bleed   . Tobacco abuse counseling 02/11/2016  . Allergic rhinitis 05/27/2016  . Community acquired pneumonia 11/28/2016  . Pneumonia 11/28/2016   Resolved Ambulatory Problems    Diagnosis Date Noted  . No Resolved Ambulatory Problems   Past Medical History:  Diagnosis Date  . Allergic rhinitis   . Arthritis   . Asthma   . Blepharitis   . Bronchitis   . Cigarette smoker   . Claustrophobia   . COPD (chronic obstructive pulmonary disease) ([place])   . Cough   . Depression   . Eye irritation   . Genital herpes   . GERD (gastroesophageal reflux disease)   . Headache   . HIV (human immunodeficiency virus infection) ([place])   . Hypertension   . Paresthesia of hand, bilateral   . Shortness of breath dyspnea    Constitutional Exam  General appearance: alert, cooperative, oriented and morbidly obese Vitals:   04/07/17 1011  BP: (!) 142/6  Pulse: 96  Resp: 20  Temp: 98.2 F (36.8 C)  TempSrc: Oral  SpO2: 99%  Weight: (!) 302 lb (137 kg)  Height: _0  (1.676 m)   BMI Assessment: Estimated body mass index is 48.74 kg/m as calculated from the following:  Height as of this encounter: _0  (1.676 m).   Weight as of this encounter: 302 lb (137 kg).  BMI interpretation table: BMI level Category Range association with higher incidence of chronic pain  <18 kg/m2 Underweight   18.5-24.9 kg/m2 Ideal body weight   25-29.9 kg/m2 Overweight Increased incidence by 20%  30-34.9 kg/m2 Obese (Class I) Increased incidence by 68%  35-39.9 kg/m2 Severe obesity (Class II) Increased incidence by 136%  >40 kg/m2 Extreme obesity (Class III) Increased incidence by 254%   BMI Readings from Last 4 Encounters:  04/07/17 48.74 kg/m  03/14/17 48.42 kg/m  02/26/17 48.42 kg/m  02/07/17 48.42 kg/m   Wt Readings from Last 4 Encounters:   04/07/17 (!) 302 lb (137 kg)  03/14/17 300 lb (136.1 kg)  02/26/17 300 lb (136.1 kg)  02/07/17 300 lb (136.1 kg)  Psych/Mental status: Alert, oriented x 3 (person, place, & time)       Eyes: PERLA Respiratory: No evidence of acute respiratory distress  Cervical spine: Pain with palpation overlying right cervical facets, C4, C5, C6, C7 roughly. Limited cervical extension secondary to pain. Full cervical flexion. Radiation of pain to right forearm with cervical extension. Strength 4 out of 5 right upper extremity: Biceps, triceps, wrist; strength 5 out of 5 left upper extremity biceps triceps wrist. Sensation equal and intact bilaterally although does endorse dysesthesias along the medial aspect of her right forearm with cervical extension.  Biceps and brachioradialis reflex 1`+ bilaterally  Lumbar Spine Area Exam  Skin & Axial Inspection: No masses, redness, or swelling Alignment: Symmetrical Functional ROM: Diminished ROM      Stability: No instability detected Muscle Tone/Strength: Functionally intact. No obvious neuro-muscular anomalies detected. Sensory (Neurological): Movement-associated discomfort Palpation: No palpable anomalies       Provocative Tests: Lumbar Hyperextension and rotation test: Positive       Lumbar Lateral bending test: Positive       Patrick's Maneuver: evaluation deferred today                    Gait & Posture Assessment  Ambulation: Unassisted Gait: Relatively normal for age and body habitus Posture: WNL   Lower Extremity Exam    Side: Right lower extremity  Side: Left lower extremity  Skin & Extremity Inspection: Skin color, temperature, and hair growth are WNL. No peripheral edema or cyanosis. No masses, redness, swelling, asymmetry, or associated skin lesions. No contractures.  Skin & Extremity Inspection: Skin color, temperature, and hair growth are WNL. No peripheral edema or cyanosis. No masses, redness, swelling, asymmetry, or associated skin  lesions. No contractures.  Functional ROM: Unrestricted ROM          Functional ROM: Unrestricted ROM          Muscle Tone/Strength: Functionally intact. No obvious neuro-muscular anomalies detected.  Muscle Tone/Strength: Functionally intact. No obvious neuro-muscular anomalies detected.  Sensory (Neurological): Unimpaired  Sensory (Neurological): Unimpaired  Palpation: No palpable anomalies  Palpation: No palpable anomalies  Patellar reflex 1+ bilaterally   A+P 56 year old female with a past medical history of morbid obesity, lumbar degenerative disc disease, lumbar pseudoarthrosis, GERD, hypertension, HIV status, depression with neck pain that started in approximately July with radiation down to the patient's right arm. Patient endorses occasional numbness and tingling in her right fingers and occasional weakness at times.   Patient did have a cervical MRI performed on 03/25/2017 which showed C3-C4 mild broad-based disc bulge, severe left facet arthropathy, mild right foraminal stenosis at C4-C5,  broad-based disc bulge at C5-C6 with severe right and mild left foraminal stenosis. Patient's neck pain with radiation to the arm could be secondary to cervical radiculopathy from neuroforaminal stenosis, cervical spondylosis with radiculopathy, severe cervical facet disease and also from cervical myofascial pain as the patient did have significant pain with radiation to distal regions upon tactile pressure on her right trapezius.  i've encouraged the patient to continue her gabapentin therapy, 300 mg 3 times a day for neuropathic pain. I will also provide the patient a referral for physical therapy for her neck and right arm. I will also schedule the patient for a right cervical medial branch nerve blocks at C4, C5, C6, C7. I have discussed that these are diagnostic blocks and that we are looking for the extent of pain relief even if it is for a short duration.If the blocks are effective for the patient we  can consider radiofrequency ablation of these nerves.discuss diagnostic cervical medial branch blocks were not effective, I will consider cervical trigger point injections for myofascial pain and also possible cervical epidural steroid injections. Patient states that she is not too interested in the epidural injections as she has had them for her low back in the past were not very effective but we'll consider it if the other 2 interventions we discussed are not effective.  Plan: #1. Right C4, C5, C6, C7 medial branch nerve blocks (diagnostic ) #2. Continue gabapentin 300 mg 3 times a day, patient HAS prescription. #3. Referral to physical therapy for neck exercises and range of motion improvement.  Future: Consideration of Lyrica, Topamax, voltaren gel to neck region. Procedural interventions could include cervical epidural steroid injection, cervical trigger point injections.  1. Cervicalgia   2. Osteoarthritis of spine with radiculopathy, cervical region   3. Neck pain   4. Morbid obesity (HCC)   5. Spondylosis, cervical, with myelopathy   6. HIV disease ([place])     Orders Placed This Encounter  Procedures  . CERVICAL FACET (MEDIAL BRANCH NERVE BLOCK)     Standing Status:   Future    Standing Expiration Date:   05/07/2017    Scheduling Instructions:     Side: Right-sided     Level: C4, C5, C6, & C7 Medial Branch Nerve     Sedation: With Sedation.     Timeframe: ASAP    Order Specific Question:   Where will this procedure be performed?    Answer:   [facility]  . Ambulatory referral to Physical Therapy    Referral Priority:   Routine    Referral Type:   Physical Medicine    Referral Reason:   Specialty Services Required    Requested Specialty:   Physical Therapy    Number of Visits Requested:   1

## 2017-04-07 NOTE — Patient Instructions (Addendum)
Today we did the following  -For your right-sided neck pain that goes down to her right arm, we discussed doing diagnostic cervical medial branch lock injections at right C4, C5, C6, C7. As we discussed, if these injections are effective for your pain, even for a short duration of time, we can consider doing a radiofrequency ablation in which we burned the nerves that we blocked for longer lasting pain relief.  -We will schedule you for a right-sided C4, C5, C6, C7 diagnostic medial branch blocks. Please be sure to wash your neck and the back of her head with a cleaning solution that we have provided to the night before and in the morning of your procedure.  -Continue gabapentin 300 mg 3 times a day. This medication is effective for nerve pain that you are experiencing.  -We will also provide your referral for physical therapy focusing on your neck region.  The majority of your pain is on the right side of her neck and right arm. However after your fall last night at [employer], you are now complaining of pain that is radiating to your left neck and left arm as well. This is likely more myofascial pain related to tissue damage from the fall which should get better with time. We will focus on doing the injections for your right-sided neck and arm pain which has been going on for a longer period of time. Please be sure to use heat and/or ice to your left neck region to help decrease any inflammation from the fall that she had yesterday for line.  Thank you for visiting the [place]:  -Remember to bring all your pain pills to every clinic visit, in their original containers. -Because of the high volume of calls we receive and the high demand for our clinical services, we are occupied all day providing care for patients in the clinic. This leaves little time to respond to phone calls, and we are generally unable to discuss patient care advice over the telephone. If you are experiencing a  medication side effect or complication, you can call and let us know, but we will typically not make a medication substitution or change over the telephone. We are generally unable to respond acutely to a flare up of pain, as this is quite common in our patients and needs to be dealt with as part of the long term management plan. Please make an appointment with [place] if you wish to discuss a matter in any detail. Should you still need to call, please do so at . Please do not call for early medication refills.  Thank you for choosing [facility]. It was a pleasure to see you in clinic today. Please contact us with any questions or concerns at  Gillis Santa, MD  ____________________________________________________________________________________________  Preparing for Procedure with Sedation Instructions: . Oral Intake: Do not eat or drink anything for at least 8 hours prior to your procedure. . Transportation: Public transportation is not allowed. Bring an adult driver. The driver must be physically present in our waiting room before any procedure can be started. [facility] Physical Assistance: Bring an adult physically capable of assisting you, in the event you need help. This adult should keep you company at home for at least 6 hours after the procedure. . Blood Pressure Medicine: Take your blood pressure medicine with a sip of water the morning of the procedure. . Blood thinners:  . Diabetics on insulin: Notify the staff so that  you can be scheduled 1st case in the morning. If your diabetes requires high dose insulin, take only  of your normal insulin dose the morning of the procedure and notify the staff that you have done so. . Preventing infections: Shower with an antibacterial soap the morning of your procedure. . Build-up your immune system: Take 1000 mg of Vitamin C with every meal (3 times a day) the day prior to your procedure. [place] Kitchen Antibiotics: Inform the staff if you have a condition or  reason that requires you to take antibiotics before dental procedures. . Pregnancy: If you are pregnant, call and cancel the procedure. . Sickness: If you have a cold, fever, or any active infections, call and cancel the procedure. . Arrival: You must be in the facility at least 30 minutes prior to your scheduled procedure. . Children: Do not bring children with you. . Dress appropriately: Bring dark clothing that you would not mind if they get stained. . Valuables: Do not bring any jewelry or valuables. Procedure appointments are reserved for interventional treatments only. [place]land Kitchen No Prescription Refills. . No medication changes will be discussed during procedure appointments. . No disability issues will be discussed. Wash your neck area with the solution the night before your procedure and the morning of your procedure. ____________________________________________________________________________________________

## 2017-04-07 NOTE — Progress Notes (Signed)
Safety precautions to be maintained throughout the outpatient stay will include: orient to surroundings, keep bed in low position, maintain call bell within reach at all times, provide assistance with transfer out of bed and ambulation.  

## 2017-04-11 ENCOUNTER — Encounter: Payer: Self-pay | Admitting: Student in an Organized Health Care Education/Training Program

## 2017-04-11 ENCOUNTER — Ambulatory Visit
Admission: RE | Admit: 2017-04-11 | Discharge: 2017-04-11 | Disposition: A | Discharge status: Home / Self Care | Payer: Medicare Other | Source: Ambulatory Visit | Attending: Student in an Organized Health Care Education/Training Program | Admitting: Student in an Organized Health Care Education/Training Program

## 2017-04-11 ENCOUNTER — Ambulatory Visit (HOSPITAL_BASED_OUTPATIENT_CLINIC_OR_DEPARTMENT_OTHER): Payer: Medicare Other | Admitting: Student in an Organized Health Care Education/Training Program

## 2017-04-11 VITALS — BP 146/80 | HR 74 | Temp 98.1°F | Resp 13 | Ht 66.0 in | Wt 302.0 lb

## 2017-04-11 DIAGNOSIS — Z6841 Body Mass Index (BMI) 40.0 and over, adult: Secondary | ICD-10-CM | POA: Diagnosis not present

## 2017-04-11 DIAGNOSIS — Z9889 Other specified postprocedural states: Secondary | ICD-10-CM | POA: Diagnosis not present

## 2017-04-11 DIAGNOSIS — M79601 Pain in right arm: Secondary | ICD-10-CM | POA: Insufficient documentation

## 2017-04-11 DIAGNOSIS — M549 Dorsalgia, unspecified: Secondary | ICD-10-CM | POA: Diagnosis not present

## 2017-04-11 DIAGNOSIS — Z885 Allergy status to narcotic agent status: Secondary | ICD-10-CM | POA: Diagnosis not present

## 2017-04-11 DIAGNOSIS — Z888 Allergy status to other drugs, medicaments and biological substances status: Secondary | ICD-10-CM | POA: Insufficient documentation

## 2017-04-11 DIAGNOSIS — M4712 Other spondylosis with myelopathy, cervical region: Secondary | ICD-10-CM

## 2017-04-11 DIAGNOSIS — M542 Cervicalgia: Secondary | ICD-10-CM | POA: Diagnosis not present

## 2017-04-11 DIAGNOSIS — M4722 Other spondylosis with radiculopathy, cervical region: Secondary | ICD-10-CM

## 2017-04-11 DIAGNOSIS — M47812 Spondylosis without myelopathy or radiculopathy, cervical region: Secondary | ICD-10-CM | POA: Insufficient documentation

## 2017-04-11 MED ORDER — TRIAMCINOLONE ACETONIDE 40 MG/ML IJ SUSP
INTRAMUSCULAR | Status: AC
  Filled 2017-04-11: qty 1

## 2017-04-11 MED ORDER — ROPIVACAINE HCL 2 MG/ML IJ SOLN
9.0000 mL | Freq: Once | INTRAMUSCULAR | Status: AC
  Administered 2017-04-11: 10 mL via PERINEURAL

## 2017-04-11 MED ORDER — LIDOCAINE HCL (PF) 1.5 % IJ SOLN
20.0000 mL | Freq: Once | INTRAMUSCULAR | Status: DC
Start: 2017-04-11 — End: 2017-04-11
  Filled 2017-04-11: qty 20

## 2017-04-11 MED ORDER — MIDAZOLAM HCL 5 MG/5ML IJ SOLN
1.0000 mg | INTRAMUSCULAR | Status: DC | PRN
  Administered 2017-04-11: 1 mg via INTRAVENOUS

## 2017-04-11 MED ORDER — ROPIVACAINE HCL 2 MG/ML IJ SOLN
INTRAMUSCULAR | Status: AC
  Filled 2017-04-11: qty 10

## 2017-04-11 MED ORDER — MIDAZOLAM HCL 5 MG/5ML IJ SOLN
INTRAMUSCULAR | Status: AC
  Filled 2017-04-11: qty 5

## 2017-04-11 MED ORDER — FENTANYL CITRATE (PF) 100 MCG/2ML IJ SOLN
INTRAMUSCULAR | Status: AC
  Filled 2017-04-11: qty 2

## 2017-04-11 MED ORDER — FENTANYL CITRATE (PF) 100 MCG/2ML IJ SOLN
25.0000 ug | INTRAMUSCULAR | Status: DC | PRN
  Administered 2017-04-11: 75 ug via INTRAVENOUS

## 2017-04-11 NOTE — Patient Instructions (Signed)
We will call you tomorrow to check on you. If you have any concerns, please fell free to call the clinic.   Pain Management Discharge Instructions  General Discharge Instructions :  If you need to reach your doctor call: Monday-Friday 8:00 am - 4:00 pm at [phone] or toll free [phone].  After clinic hours [phone] to have operator reach doctor.  Bring all of your medication bottles to all your appointments in the pain clinic.  To cancel or reschedule your appointment with Pain Management please remember to call 24 hours in advance to avoid a fee.  Refer to the educational materials which you have been given on: General Risks, I had my Procedure. Discharge Instructions, Post Sedation.  Post Procedure Instructions:  The drugs you were given will stay in your system until tomorrow, so for the next 24 hours you should not drive, make any legal decisions or drink any alcoholic beverages.  You may eat anything you prefer, but it is better to start with liquids then soups and crackers, and gradually work up to solid foods.  Please notify your doctor immediately if you have any unusual bleeding, trouble breathing or pain that is not related to your normal pain.  Depending on the type of procedure that was done, some parts of your body may feel week and/or numb.  This usually clears up by tonight or the next day.  Walk with the use of an assistive device or accompanied by an adult for the 24 hours.  You may use ice on the affected area for the first 24 hours.  Put ice in a Ziploc bag and cover with a towel and place against area 15 minutes on 15 minutes off.  You may switch to heat after 24 hours.

## 2017-04-11 NOTE — Progress Notes (Addendum)
Patient's Name: Melanie Bowen  MRN: [id number]  Referring Provider: Theotis Burrow*  DOB: [birth year]  PCP: Theotis Burrow, MD  DOS: 04/11/2017  Note by: Gillis Santa, MD  Service setting: Ambulatory outpatient  Specialty: Interventional Pain Management  Patient type: Established  Location: [facility] (AMB) Pain Management Facility  Visit type: Interventional Procedure   Primary Reason for Visit: Interventional Pain Management Treatment. CC: Neck Pain (right); Arm Pain (right); and Back Pain (right)  Procedure:  Anesthesia, Analgesia, Anxiolysis:  Type: Diagnostic Cervical Facet Medial Branch Block(s) Region: Posterolateral cervical spine region Level: C4, C5, C6, & C7 Medial Branch Level(s) Laterality: Right   Type: Local Anesthesia with Moderate (Conscious) Sedation Local Anesthetic: Lidocaine 1% Route: Intravenous (IV) IV Access: Secured Sedation: Meaningful verbal contact was maintained at all times during the procedure  Indication(s): Analgesia and Anxiety  Indications: 1. Spondylosis, cervical, with myelopathy   2. Cervicalgia   3. Osteoarthritis of spine with radiculopathy, cervical region   4. Morbid obesity (HCC)    Pain Score: Pre-procedure: 9 /10 Post-procedure: 0-No pain (pt with eyes closed- easily arousable. Audible wheezing note)/10  Pre-op Assessment:  Ms. Biancardi is a 56 y.o. (year old), female patient, seen today for interventional treatment. She  has a past surgical history that includes Knee surgery (Bilateral, [id number]); Hand surgery (Bilateral); Tubal ligation; Laparoscopy for ectopic pregnancy; Bunionectomy (Bilateral); Finger fracture (Right); back surgery may 2016; Video bronchoscopy (N/A, 06/30/2015); Diagnostic laparoscopy; Back surgery; Joint replacement (Bilateral); Cardiac catheterization (N/A, 01/12/2016); Cardiac catheterization (N/A, 01/12/2016); and Esophagogastroduodenoscopy (Left, 01/11/2016). Ms. Seckinger has a current medication list which  includes the following prescription(s): albuterol, amlodipine, fluticasone-salmeterol, genvoya, hydrochlorothiazide, ipratropium-albuterol, losartan, omeprazole, oxycodone, promethazine, valacyclovir, amoxicillin-clavulanate, benzonatate, chlorpheniramine-hydrocodone, dicyclomine, efavirenz-emtricitabine-tenofovir, guaifenesin-codeine, magic mouthwash, meloxicam, polyethylene glycol, prednisone, and prednisone, and the following Facility-Administered Medications: fentanyl, lidocaine, and midazolam. Her primarily concern today is the Neck Pain (right); Arm Pain (right); and Back Pain (right)  Initial Vital Signs: Blood pressure (!) 154/79, pulse 85, temperature 98.4 F (36.9 C), temperature source Oral, resp. rate 16, height 5\' 6"  (1.676 m), weight (!) 302 lb (137 kg), SpO2 100 %. BMI: Estimated body mass index is 48.74 kg/m as calculated from the following:   Height as of this encounter: 5\' 6"  (1.676 m).   Weight as of this encounter: 302 lb (137 kg).  Risk Assessment: Allergies: Reviewed. She is allergic to morphine and related; gabapentin; and zanaflex  [tizanidine].  Allergy Precautions: None required Coagulopathies: Reviewed. None identified.  Blood-thinner therapy: None at this time Active Infection(s): Reviewed. None identified. Ms. Santacroce is afebrile  Site Confirmation: Ms. Jani was asked to confirm the procedure and laterality before marking the site Procedure checklist: Completed Consent: Before the procedure and under the influence of no sedative(s), amnesic(s), or anxiolytics, the patient was informed of the treatment options, risks and possible complications. To fulfill our ethical and legal obligations, as recommended by the American Medical Association's Code of Ethics, I have informed the patient of my clinical impression; the nature and purpose of the treatment or procedure; the risks, benefits, and possible complications of the intervention; the alternatives, including doing  nothing; the risk(s) and benefit(s) of the alternative treatment(s) or procedure(s); and the risk(s) and benefit(s) of doing nothing. The patient was provided information about the general risks and possible complications associated with the procedure. These may include, but are not limited to: failure to achieve desired goals, infection, bleeding, organ or nerve damage, allergic reactions, paralysis, and death. In addition, the patient was informed  of those risks and complications associated to Spine-related procedures, such as failure to decrease pain; infection (i.e.: Meningitis, epidural or intraspinal abscess); bleeding (i.e.: epidural hematoma, subarachnoid hemorrhage, or any other type of intraspinal or peri-dural bleeding); organ or nerve damage (i.e.: Any type of peripheral nerve, nerve root, or spinal cord injury) with subsequent damage to sensory, motor, and/or autonomic systems, resulting in permanent pain, numbness, and/or weakness of one or several areas of the body; allergic reactions; (i.e.: anaphylactic reaction); and/or death. Furthermore, the patient was informed of those risks and complications associated with the medications. These include, but are not limited to: allergic reactions (i.e.: anaphylactic or anaphylactoid reaction(s)); adrenal axis suppression; blood sugar elevation that in diabetics may result in ketoacidosis or comma; water retention that in patients with history of congestive heart failure may result in shortness of breath, pulmonary edema, and decompensation with resultant heart failure; weight gain; swelling or edema; medication-induced neural toxicity; particulate matter embolism and blood vessel occlusion with resultant organ, and/or nervous system infarction; and/or aseptic necrosis of one or more joints. Finally, the patient was informed that Medicine is not an exact science; therefore, there is also the possibility of unforeseen or unpredictable risks and/or possible  complications that may result in a catastrophic outcome. The patient indicated having understood very clearly. We have given the patient no guarantees and we have made no promises. Enough time was given to the patient to ask questions, all of which were answered to the patient's satisfaction. Ms. Pontarelli has indicated that she wanted to continue with the procedure. Attestation: I, the ordering provider, attest that I have discussed with the patient the benefits, risks, side-effects, alternatives, likelihood of achieving goals, and potential problems during recovery for the procedure that I have provided informed consent. Date: 04/11/2017; Time: 9:31 AM  Pre-Procedure Preparation:  Monitoring: As per clinic protocol. Respiration, ETCO2, SpO2, BP, heart rate and rhythm monitor placed and checked for adequate function Safety Precautions: Patient was assessed for positional comfort and pressure points before starting the procedure. Time-out: I initiated and conducted the "Time-out" before starting the procedure, as per protocol. The patient was asked to participate by confirming the accuracy of the "Time Out" information. Verification of the correct person, site, and procedure were performed and confirmed by me, the nursing staff, and the patient. "Time-out" conducted as per Joint Commission's Universal Protocol (UP.01.01.01). "Time-out" Date & Time: 04/11/2017; 1011 hrs.  Description of Procedure Process:   Position: Prone with head of the table was raised to facilitate breathing. Target Area: For Cervical Facet blocks, the target is the postero-lateral waist of the articular pillars at the C4, C5, C6, & C7 levels. Approach: Posterior approach. Area Prepped: Entire Posterior Cervico-thoracic Region Prepping solution: ChloraPrep (2% chlorhexidine gluconate and 70% isopropyl alcohol) Safety Precautions: Aspiration looking for blood return was conducted prior to all injections. At no point did we inject any  substances, as a needle was being advanced. No attempts were made at seeking any paresthesias. Safe injection practices and needle disposal techniques used. Medications properly checked for expiration dates. SDV (single dose vial) medications used. Description of the Procedure: Protocol guidelines were followed. The patient was placed in position over the fluoroscopy table. The target area was identified and the area prepped in the usual manner. Skin desensitized using vapocoolant spray. Skin & deeper tissues infiltrated with local anesthetic. Appropriate amount of time allowed to pass for local anesthetics to take effect. The procedure needle was introduced through the skin, ipsilateral to the reported pain,  and advanced to the target area. Bone was contacted on the posterior aspect of the articular pillars and the needle walked lateral, until the border was cleared. Lateral views taken to make sure the needle tip did not advance past the posterior third of the lateral mass of the posterior columns. The procedure was repeated in identical fashion for each level. Negative aspiration confirmed. Solution injected in intermittent fashion, asking for systemic symptoms every 0.5cc of injectate. The needles were then removed and the area cleansed, making sure to leave some of the prepping solution back to take advantage of its long term bactericidal properties. Vitals:   04/11/17 1029 04/11/17 1033 04/11/17 1042 04/11/17 1052  BP: (!) 159/102 (!) 152/95 135/73 (!) 146/80  Pulse: 82 76 85 74  Resp: 18 17 15 13   Temp:    98.1 F (36.7 C)  TempSrc:      SpO2: 100% 100% 97% 98%  Weight:      Height:        Start Time: 1012 hrs. End Time: 1031 hrs. Materials:  Needle(s) Type: Regular needle Gauge: 22G Length: 3.5-in Medication(s): We administered ropivacaine (PF) 2 mg/mL (0.2%), midazolam, and fentaNYL. Please see chart orders for dosing details. Ropivacaine 0.2%, 1 cc at each cervical level.  Imaging  Guidance (Spinal):  Type of Imaging Technique: Fluoroscopy Guidance (Spinal) Indication(s): Assistance in needle guidance and placement for procedures requiring needle placement in or near specific anatomical locations not easily accessible without such assistance. Exposure Time: Please see nurses notes. Contrast: None used. Fluoroscopic Guidance: I was personally present during the use of fluoroscopy. "Tunnel Vision Technique" used to obtain the best possible view of the target area. Parallax error corrected before commencing the procedure. "Direction-depth-direction" technique used to introduce the needle under continuous pulsed fluoroscopy. Once target was reached, antero-posterior, oblique, and lateral fluoroscopic projection used confirm needle placement in all planes. Images permanently stored in EMR. Interpretation: No contrast injected. I personally interpreted the imaging intraoperatively. Adequate needle placement confirmed in multiple planes. Permanent images saved into the patient's record.  Antibiotic Prophylaxis:  Indication(s): None identified Antibiotic given: None  Post-operative Assessment:  EBL: None Complications: No immediate post-treatment complications observed by team, or reported by patient. Note: The patient tolerated the entire procedure well. A repeat set of vitals were taken after the procedure and the patient was kept under observation following institutional policy, for this type of procedure. Post-procedural neurological assessment was performed, showing return to baseline, prior to discharge. The patient was provided with post-procedure discharge instructions, including a section on how to identify potential problems. Should any problems arise concerning this procedure, the patient was given instructions to immediately contact us, at any time, without hesitation. In any case, we plan to contact the patient by telephone for a follow-up status report regarding this  interventional procedure. Comments:  No additional relevant information.  Plan of Care  Disposition: Discharge home  Discharge Date & Time: 04/11/2017; 1105 hrs.  5 out of 5 strength bilateral upper extremity: Biceps triceps and wrist upon discharge.  Physician-requested Follow-up:  Return in about 2 weeks (around 04/25/2017) for Post Procedure Evaluation.  New Prescriptions   No medications on file   Future Appointments Date Time Provider [place]  04/26/2017 8:45 AM Gillis Santa, MD [facility] None  05/09/2017 9:20 AM [facility]    Imaging Orders     DG C-Arm 1-60 Min-No Report Procedure Orders    No procedure(s) ordered today    Medications ordered for  procedure: Meds ordered this encounter  Medications  . lidocaine 1.5 % injection 20 mL    From block tray  . ropivacaine (PF) 2 mg/mL (0.2%) (NAROPIN) injection 9 mL  . midazolam (VERSED) 5 MG/5ML injection 1-2 mg    Make sure Flumazenil is available in the pyxis when using this medication. If oversedation occurs, administer 0.2 mg IV over 15 sec. If after 45 sec no response, administer 0.2 mg again over 1 min; may repeat at 1 min intervals; not to exceed 4 doses (1 mg)  . fentaNYL (SUBLIMAZE) injection 25-50 mcg    Make sure Narcan is available in the pyxis when using this medication. In the event of respiratory depression (RR< 8/min): Titrate NARCAN (naloxone) in increments of 0.1 to 0.2 mg IV at 2-3 minute intervals, until desired degree of reversal.   Medications administered: We administered ropivacaine (PF) 2 mg/mL (0.2%), midazolam, and fentaNYL.  See the medical record for exact dosing, route, and time of administration.  Primary Care Physician: Theotis Burrow, MD Location: [facility] Note by: Gillis Santa, MD Date: 04/11/2017; Time: 11:50 AM  Disclaimer:  Medicine is not an exact science. The only guarantee in medicine is that nothing is guaranteed. It  is important to note that the decision to proceed with this intervention was based on the information collected from the patient. The Data and conclusions were drawn from the patient's questionnaire, the interview, and the physical examination. Because the information was provided in large part by the patient, it cannot be guaranteed that it has not been purposely or unconsciously manipulated. Every effort has been made to obtain as much relevant data as possible for this evaluation. It is important to note that the conclusions that lead to this procedure are derived in large part from the available data. Always take into account that the treatment will also be dependent on availability of resources and existing treatment guidelines, considered by other Pain Management Practitioners as being common knowledge and practice, at the time of the intervention. For Medico-Legal purposes, it is also important to point out that variation in procedural techniques and pharmacological choices are the acceptable norm. The indications, contraindications, technique, and results of the above procedure should only be interpreted and judged by a Board-Certified Interventional Pain Specialist with extensive familiarity and expertise in the same exact procedure and technique.

## 2017-04-12 ENCOUNTER — Telehealth: Payer: Self-pay | Admitting: *Deleted

## 2017-04-12 NOTE — Telephone Encounter (Signed)
Left voice mail

## 2017-04-13 ENCOUNTER — Telehealth: Payer: Self-pay | Admitting: *Deleted

## 2017-04-13 NOTE — Telephone Encounter (Signed)
Patient informed that it may take 4-10 days to feel effects of steroid.

## 2017-04-15 ENCOUNTER — Telehealth: Payer: Self-pay | Admitting: Student in an Organized Health Care Education/Training Program

## 2017-04-15 ENCOUNTER — Telehealth: Payer: Self-pay | Admitting: Pain Medicine

## 2017-04-15 NOTE — Telephone Encounter (Signed)
Error

## 2017-04-15 NOTE — Telephone Encounter (Signed)
Still having a lot of arm pain, gabapentin not helping, going to ED

## 2017-04-16 ENCOUNTER — Emergency Department
Admission: EM | Admit: 2017-04-16 | Discharge: 2017-04-16 | Disposition: A | Discharge status: Home / Self Care | Payer: Medicare Other | Attending: Emergency Medicine | Admitting: Emergency Medicine

## 2017-04-16 ENCOUNTER — Emergency Department: Payer: Medicare Other

## 2017-04-16 DIAGNOSIS — Z79899 Other long term (current) drug therapy: Secondary | ICD-10-CM | POA: Diagnosis not present

## 2017-04-16 DIAGNOSIS — Z21 Asymptomatic human immunodeficiency virus [HIV] infection status: Secondary | ICD-10-CM | POA: Insufficient documentation

## 2017-04-16 DIAGNOSIS — Y999 Unspecified external cause status: Secondary | ICD-10-CM | POA: Diagnosis not present

## 2017-04-16 DIAGNOSIS — Y92512 Supermarket, store or market as the place of occurrence of the external cause: Secondary | ICD-10-CM | POA: Diagnosis not present

## 2017-04-16 DIAGNOSIS — S40211A Abrasion of right shoulder, initial encounter: Secondary | ICD-10-CM | POA: Insufficient documentation

## 2017-04-16 DIAGNOSIS — Z96653 Presence of artificial knee joint, bilateral: Secondary | ICD-10-CM | POA: Insufficient documentation

## 2017-04-16 DIAGNOSIS — W010XXA Fall on same level from slipping, tripping and stumbling without subsequent striking against object, initial encounter: Secondary | ICD-10-CM | POA: Insufficient documentation

## 2017-04-16 DIAGNOSIS — I10 Essential (primary) hypertension: Secondary | ICD-10-CM | POA: Insufficient documentation

## 2017-04-16 DIAGNOSIS — Y939 Activity, unspecified: Secondary | ICD-10-CM | POA: Insufficient documentation

## 2017-04-16 DIAGNOSIS — S4991XA Unspecified injury of right shoulder and upper arm, initial encounter: Secondary | ICD-10-CM | POA: Diagnosis present

## 2017-04-16 DIAGNOSIS — S40011A Contusion of right shoulder, initial encounter: Secondary | ICD-10-CM

## 2017-04-16 DIAGNOSIS — F17210 Nicotine dependence, cigarettes, uncomplicated: Secondary | ICD-10-CM | POA: Insufficient documentation

## 2017-04-16 DIAGNOSIS — J449 Chronic obstructive pulmonary disease, unspecified: Secondary | ICD-10-CM | POA: Diagnosis not present

## 2017-04-16 MED ORDER — METHOCARBAMOL 750 MG PO TABS: 750.0000 mg | ORAL_TABLET | Freq: Four times a day (QID) | ORAL | 0 refills | Status: DC

## 2017-04-16 MED ORDER — NAPROXEN 500 MG PO TABS: 500.0000 mg | ORAL_TABLET | Freq: Two times a day (BID) | ORAL | Status: DC

## 2017-04-16 NOTE — ED Provider Notes (Signed)
[facility] Emergency Department Provider Note   ____________________________________________   First MD Initiated Contact with Patient 04/16/17 [phone]     (approximate)  I have reviewed the triage vital signs and the nursing notes.   HISTORY  Chief Complaint Fall and Shoulder Pain    HPI Melanie Bowen is a 56 y.o. female patient complaining a right shoulder pain secondary to a fall one week ago. Patient states she slipped and fell at a grocery store. This the first time the patient seek medical care for this complaint. Patient stated pain increases with abduction and overhead reaching. Patient rates the pain as 7/10. Patient is right-hand dominant. No palliative measures for complaint. Past Medical History:  Diagnosis Date  . Allergic rhinitis   . Arthritis    DDD  . Asthma   . Blepharitis   . Bronchitis   . Cigarette smoker   . Claustrophobia   . COPD (chronic obstructive pulmonary disease) ([place])   . Cough   . Depression   . Eye irritation   . Genital herpes   . GERD (gastroesophageal reflux disease)   . Headache   . HIV (human immunodeficiency virus infection) ([place])   . Hypertension   . Paresthesia of hand, bilateral   . Shortness of breath dyspnea    at times due to asthma    Patient Active Problem List   Diagnosis Date Noted  . Community acquired pneumonia 11/28/2016  . Pneumonia 11/28/2016  . Allergic rhinitis 05/27/2016  . Tobacco abuse counseling 02/11/2016  . Symptomatic anemia   . Lower GI bleed   . Anemia 01/16/2016  . GI bleed 01/10/2016  . GERD (gastroesophageal reflux disease) 01/10/2016  . HTN (hypertension) 01/10/2016  . Depression 01/10/2016  . HIV (human immunodeficiency virus infection) ([place]) 01/10/2016  . Lumbar pseudoarthrosis 10/31/2015  . Chronic cough   . Cough 06/19/2015  . DDD (degenerative disc disease), lumbar 12/31/2014  . Degenerative disc disease, lumbar 12/31/2014  . Asthma, chronic 11/12/2014      Past Surgical History:  Procedure Laterality Date  . BACK SURGERY    . back surgery may 2016     lumbar   . BUNIONECTOMY Bilateral    feet  . DIAGNOSTIC LAPAROSCOPY    . ESOPHAGOGASTRODUODENOSCOPY Left 01/11/2016   Procedure: ESOPHAGOGASTRODUODENOSCOPY (EGD);  Surgeon: Hulen Luster, MD;  Location: [facility] ENDOSCOPY;  Service: Endoscopy;  Laterality: Left;  . Finger fracture Right    5th finger  . HAND SURGERY Bilateral    carpal tunnel  . JOINT REPLACEMENT Bilateral    knees  . KNEE SURGERY Bilateral 2003,2007   total Joint  . LAPAROSCOPY FOR ECTOPIC PREGNANCY    . PERIPHERAL VASCULAR CATHETERIZATION N/A 01/12/2016   Procedure: Visceral Angiography;  Surgeon: Algernon Huxley, MD;  Location: [place] [facility];  Service: Cardiovascular;  Laterality: N/A;  . PERIPHERAL VASCULAR CATHETERIZATION N/A 01/12/2016   Procedure: Visceral Artery Intervention;  Surgeon: Algernon Huxley, MD;  Location: [place] [facility];  Service: Cardiovascular;  Laterality: N/A;  . TUBAL LIGATION    . VIDEO BRONCHOSCOPY N/A 06/30/2015   Procedure: VIDEO BRONCHOSCOPY WITHOUT FLUORO;  Surgeon: Vilinda Boehringer, MD;  Location: [facility] ORS;  Service: Cardiopulmonary;  Laterality: N/A;    Prior to Admission medications   Medication Sig Start Date End Date Taking? Authorizing Provider  albuterol (PROVENTIL HFA;VENTOLIN HFA) 108 (90 BASE) MCG/ACT inhaler Inhale 2 puffs into the lungs every 6 (six) hours as needed for wheezing or shortness of  breath.     [provider]  amLODipine (NORVASC) 10 MG tablet Take 10 mg by mouth daily.     [provider]  amoxicillin-clavulanate (AUGMENTIN) 875-125 MG tablet Take 1 tablet by mouth 2 (two) times daily. Patient not taking: Reported on 04/07/2017 12/01/16   Max Sane, MD  benzonatate (TESSALON PERLES) 100 MG capsule Take 1 capsule (100 mg total) by mouth every 6 (six) hours as needed for cough. Patient not taking: Reported on 04/11/2017 02/07/17 02/07/18  Nance Pear,  MD  chlorpheniramine-HYDROcodone [facility]) 10-8 MG/5ML SUER Take 5 mLs by mouth 2 (two) times daily. Patient not taking: Reported on 04/07/2017 03/14/17   Earleen Newport, MD  dicyclomine (BENTYL) 10 MG capsule Take 1 capsule (10 mg total) by mouth 3 (three) times daily as needed (Abdominal cramping). Patient not taking: Reported on 11/28/2016 01/18/16   Max Sane, MD  efavirenz-emtricitabine-tenofovir (ATRIPLA) 600-200-300 MG per tablet Take 1 tablet by mouth at bedtime.    [provider]  Fluticasone-Salmeterol (ADVAIR DISKUS) 500-50 MCG/DOSE AEPB Inhale 1 puff into the lungs 2 (two) times daily. 05/27/16   Mungal, Vishal, MD  GENVOYA 150-150-200-10 MG TABS tablet Take 1 tablet by mouth daily. 11/09/16   [provider]  guaiFENesin-codeine 100-10 MG/5ML syrup Take 5 mLs by mouth every 6 (six) hours as needed for cough. Patient not taking: Reported on 04/07/2017 12/23/16   Harvest Dark, MD  hydrochlorothiazide (HYDRODIURIL) 25 MG tablet Take 25 mg by mouth daily.    [provider]  ipratropium-albuterol (DUONEB) 0.5-2.5 (3) MG/3ML SOLN Inhale 3 mLs into the lungs every 6 (six) hours as needed (for wheezing/shortness of breath).     [provider]  losartan (COZAAR) 50 MG tablet Take 50 mg by mouth daily.    [provider]  magic mouthwash SOLN Take 5 mLs by mouth 4 (four) times daily as needed for mouth pain. Patient not taking: Reported on 04/07/2017 12/01/16   Max Sane, MD  meloxicam (MOBIC) 15 MG tablet Take 1 tablet (15 mg total) by mouth daily. Patient not taking: Reported on 04/07/2017 02/26/17   Cuthriell, Charline Bills, PA-C  methocarbamol (ROBAXIN-750) 750 MG tablet Take 1 tablet (750 mg total) by mouth 4 (four) times daily. 04/16/17   Sable Feil, PA-C  naproxen (NAPROSYN) 500 MG tablet Take 1 tablet (500 mg total) by mouth 2 (two) times daily with a meal. 04/16/17   Sable Feil, PA-C  omeprazole (PRILOSEC) 40 MG  capsule Take 40 mg by mouth daily.     [provider]  oxyCODONE (ROXICODONE) 15 MG immediate release tablet Take 15 mg by mouth every 6 (six) hours as needed for pain.    [provider]  polyethylene glycol (MIRALAX / GLYCOLAX) packet Take 17 g by mouth daily. Patient not taking: Reported on 04/07/2017 12/01/16   Max Sane, MD  predniSONE (DELTASONE) 20 MG tablet Take 2 tablets (40 mg total) by mouth daily. Patient not taking: Reported on 04/07/2017 02/07/17   Nance Pear, MD  predniSONE (STERAPRED UNI-PAK 21 TAB) 10 MG (21) TBPK tablet Take by mouth daily. Dispense steroid taper pack as instructed Patient not taking: Reported on 04/07/2017 03/14/17   Earleen Newport, MD  promethazine (PHENERGAN) 25 MG tablet Take 25 mg by mouth every 6 (six) hours as needed for nausea or vomiting.     [provider]  valACYclovir (VALTREX) 500 MG tablet Take 500 mg by mouth 2 (two) times daily.  [provider]    Allergies Morphine and related; Gabapentin; and Zanaflex  [tizanidine]  Family History  Problem Relation Age of Onset  . Breast cancer Sister     Social History Social History  Substance Use Topics  . Smoking status: Current Some Day Smoker    Years: 38.00    Types: Cigarettes  . Smokeless tobacco: Never Used     Comment: 1 cig daily  . Alcohol use 3.0 oz/week    5 Glasses of wine per week     Comment: wine - couple days of week    Review of Systems  Constitutional: No fever/chills Eyes: No visual changes. ENT: No sore throat. Cardiovascular: Denies chest pain. Respiratory: Denies shortness of breath. Gastrointestinal: No abdominal pain.  No nausea, no vomiting.  No diarrhea.  No constipation. Genitourinary: Negative for dysuria. Musculoskeletal: Right shoulder pain Skin: Negative for rash. Neurological: Negative for headaches, focal weakness or numbness. Psychiatric:Depression Endocrine:Hypertension Allergic/Immunilogical:  HIV/see medication list ____________________________________________   PHYSICAL EXAM:  VITAL SIGNS: ED Triage Vitals [04/16/17 0825]  Enc Vitals Group     BP 132/72     Pulse Rate 88     Resp 18     Temp 98.6 F (37 C)     Temp Source Oral     SpO2 98 %     Weight      Height      Head Circumference      Peak Flow      Pain Score      Pain Loc      Pain Edu?      Excl. in [place]?     Constitutional: Alert and oriented. Well appearing and in no acute distress.Morbid obesity Neck: No stridor.  No cervical spine tenderness to palpation. Hematological/Lymphatic/Immunilogical: No cervical lymphadenopathy. Cardiovascular: Normal rate, regular rhythm. Grossly normal heart sounds.  Good peripheral circulation. Respiratory: Normal respiratory effort.  No retractions. Lungs CTAB. Musculoskeletal: No obvious deformity to the right shoulder. Decreased range of motion with adduction and overhead reaching and limited by complaining of pain. Moderate guarding palpation the [place] joint. Neurologic:  Normal speech and language. No gross focal neurologic deficits are appreciated. No gait instability. Skin:  Skin is warm, dry and intact. No rash noted. No abrasion or ecchymosis. Psychiatric: Mood and affect are normal. Speech and behavior are normal.  ____________________________________________   LABS (all labs ordered are listed, but only abnormal results are displayed)  Labs Reviewed - No data to display ____________________________________________  EKG   ____________________________________________  RADIOLOGY  Dg Shoulder Right  Result Date: 04/16/2017 CLINICAL DATA:  Pain after fall 1 week ago. EXAM: RIGHT SHOULDER - 2+ VIEW COMPARISON:  None. FINDINGS: There is no evidence of fracture or dislocation. There is no evidence of arthropathy or other focal bone abnormality. Soft tissues are unremarkable. IMPRESSION: Negative. Electronically Signed   By: Dorise Bullion III M.D   On:  04/16/2017 09:29    _No acute findings x-ray of the right shoulder. ___________________________________________   PROCEDURES  Procedure(s) performed: None  Procedures  Critical Care performed: No  ____________________________________________   INITIAL IMPRESSION / ASSESSMENT AND PLAN / ED COURSE  Pertinent labs & imaging results that were available during my care of the patient were reviewed by me and considered in my medical decision making (see chart for details).  Right shoulder pain secondary to fall/contusion. Discussed negative findings on x-ray with patient. Patient given discharge care instructions. Patient is on chronic narcotic pain medication for  her back. Patient given a prescription for Robaxin and naproxen. Patient denies follow PCP for continual care.      ____________________________________________   FINAL CLINICAL IMPRESSION(S) / ED DIAGNOSES  Final diagnoses:  Contusion of right shoulder, initial encounter      NEW MEDICATIONS STARTED DURING THIS VISIT:  New Prescriptions   METHOCARBAMOL (ROBAXIN-750) 750 MG TABLET    Take 1 tablet (750 mg total) by mouth 4 (four) times daily.   NAPROXEN (NAPROSYN) 500 MG TABLET    Take 1 tablet (500 mg total) by mouth 2 (two) times daily with a meal.     Note:  This document was prepared using Dragon voice recognition software and may include unintentional dictation errors.    Sable Feil, PA-C 04/16/17 [id number]    Schuyler Amor, MD 04/18/17 [phone]

## 2017-04-16 NOTE — ED Triage Notes (Signed)
Right side pain after falling on floor at [employer], injury occurred greater than one week ago. Pt alert and oriented X4, active, cooperative, pt in NAD. RR even and unlabored, color WNL.

## 2017-04-18 ENCOUNTER — Telehealth: Payer: Self-pay | Admitting: *Deleted

## 2017-04-18 NOTE — Telephone Encounter (Signed)
Went to ED for arm pain. Was given Naprosyn and Flexeril. Feels some better, but has days which still has pain.

## 2017-04-26 ENCOUNTER — Encounter: Payer: Self-pay | Admitting: Student in an Organized Health Care Education/Training Program

## 2017-04-26 ENCOUNTER — Ambulatory Visit
Payer: Medicare Other | Attending: Student in an Organized Health Care Education/Training Program | Admitting: Student in an Organized Health Care Education/Training Program

## 2017-04-26 VITALS — BP 137/65 | HR 85 | Temp 98.2°F | Resp 18 | Ht 66.0 in | Wt 303.0 lb

## 2017-04-26 DIAGNOSIS — Z79891 Long term (current) use of opiate analgesic: Secondary | ICD-10-CM | POA: Insufficient documentation

## 2017-04-26 DIAGNOSIS — Z9851 Tubal ligation status: Secondary | ICD-10-CM | POA: Insufficient documentation

## 2017-04-26 DIAGNOSIS — B20 Human immunodeficiency virus [HIV] disease: Secondary | ICD-10-CM | POA: Diagnosis not present

## 2017-04-26 DIAGNOSIS — F17210 Nicotine dependence, cigarettes, uncomplicated: Secondary | ICD-10-CM | POA: Insufficient documentation

## 2017-04-26 DIAGNOSIS — K219 Gastro-esophageal reflux disease without esophagitis: Secondary | ICD-10-CM | POA: Insufficient documentation

## 2017-04-26 DIAGNOSIS — M5106 Intervertebral disc disorders with myelopathy, lumbar region: Secondary | ICD-10-CM | POA: Diagnosis not present

## 2017-04-26 DIAGNOSIS — I10 Essential (primary) hypertension: Secondary | ICD-10-CM | POA: Insufficient documentation

## 2017-04-26 DIAGNOSIS — Z6841 Body Mass Index (BMI) 40.0 and over, adult: Secondary | ICD-10-CM | POA: Diagnosis not present

## 2017-04-26 DIAGNOSIS — M47812 Spondylosis without myelopathy or radiculopathy, cervical region: Secondary | ICD-10-CM | POA: Diagnosis not present

## 2017-04-26 DIAGNOSIS — Z79899 Other long term (current) drug therapy: Secondary | ICD-10-CM | POA: Insufficient documentation

## 2017-04-26 DIAGNOSIS — J440 Chronic obstructive pulmonary disease with acute lower respiratory infection: Secondary | ICD-10-CM | POA: Diagnosis not present

## 2017-04-26 DIAGNOSIS — Z885 Allergy status to narcotic agent status: Secondary | ICD-10-CM | POA: Insufficient documentation

## 2017-04-26 DIAGNOSIS — F329 Major depressive disorder, single episode, unspecified: Secondary | ICD-10-CM | POA: Insufficient documentation

## 2017-04-26 DIAGNOSIS — M4722 Other spondylosis with radiculopathy, cervical region: Secondary | ICD-10-CM

## 2017-04-26 DIAGNOSIS — D649 Anemia, unspecified: Secondary | ICD-10-CM | POA: Diagnosis not present

## 2017-04-26 DIAGNOSIS — M4712 Other spondylosis with myelopathy, cervical region: Secondary | ICD-10-CM

## 2017-04-26 DIAGNOSIS — M79601 Pain in right arm: Secondary | ICD-10-CM | POA: Insufficient documentation

## 2017-04-26 DIAGNOSIS — Z9889 Other specified postprocedural states: Secondary | ICD-10-CM | POA: Insufficient documentation

## 2017-04-26 DIAGNOSIS — M542 Cervicalgia: Secondary | ICD-10-CM | POA: Diagnosis not present

## 2017-04-26 MED ORDER — TOPIRAMATE 25 MG PO CPSP: 25.0000 mg | ORAL_CAPSULE | Freq: Two times a day (BID) | ORAL | 1 refills | Status: DC

## 2017-04-26 MED ORDER — DICLOFENAC SODIUM 1 % TD GEL: 4.0000 g | Freq: Four times a day (QID) | TRANSDERMAL | 2 refills | Status: AC

## 2017-04-26 NOTE — Progress Notes (Signed)
Safety precautions to be maintained throughout the outpatient stay will include: orient to surroundings, keep bed in low position, maintain call bell within reach at all times, provide assistance with transfer out of bed and ambulation.  

## 2017-04-26 NOTE — Patient Instructions (Signed)
Today we discussed the following:  #1. I'm glad that your neck pain is doing better from our nerve block. We will see how you do over the next month, if your neck pain returns, we can repeat nerve block followed by radiofrequency ablation will we burn the nerves to provide you with longer lasting pain relief.  #2. Stop gabapentin since this medication is not effective and has caused itching. Please start Topamax 25 mg twice daily.  #3. Referral to physical therapy regarding right shoulder and right upper extremity range of motion and strength exercises.  #4. Follow-up with me in 4-6 weeks. At that time we can discuss repeating cervical medial branch nerve blocks followed by radio frequency ablation.

## 2017-04-26 NOTE — Progress Notes (Signed)
Patient's Name: Melanie Bowen  MRN: [id number]  Referring Provider: Theotis Burrow*  DOB: 1961/03/24  PCP: Theotis Burrow, MD  DOS: 04/26/2017  Note by: Gillis Santa, MD  Service setting: Ambulatory outpatient  Specialty: Interventional Pain Management  Location: [facility] (AMB) Pain Management Facility    Patient type: Established   Primary Reason(s) for Visit: Encounter for post-procedure evaluation of chronic illness with mild to moderate exacerbation CC: Arm Pain (right)  HPI  Melanie Bowen is a 56 y.o. year old, female patient, who comes today for a post-procedure evaluation. She has Asthma, chronic; DDD (degenerative disc disease), lumbar; Degenerative disc disease, lumbar; Cough; Chronic cough; Lumbar pseudoarthrosis; GI bleed; GERD (gastroesophageal reflux disease); HTN (hypertension); Depression; HIV (human immunodeficiency virus infection) ([place]); Anemia; Symptomatic anemia; Lower GI bleed; Tobacco abuse counseling; Allergic rhinitis; Community acquired pneumonia; and Pneumonia on her problem list. Her primarily concern today is the Arm Pain (right)  Pain Assessment: Location: Right Arm Radiating: radiates from shoulder to fingers on the outside  Onset: More than a month ago Duration: Chronic pain Quality: Aching, Sharp, Stabbing, Tingling, Numbness Severity: 9 /10 (self-reported pain score)  Note: Reported level is compatible with observation.                   Effect on ADL:   Timing: Constant Modifying factors: massage  Melanie Bowen comes in today for post-procedure evaluation after the treatment done on 04/15/2017.  Further details on both, my assessment(s), as well as the proposed treatment plan, please see below.  Post-Procedure Assessment  04/15/2017 Procedure: Right C4, C5, C6, C7 medial branch nerve block Pre-procedure: 9 /10 Post-procedure: 0-No pain (pt with eyes closed- easily arousable. Audible wheezing note)/10 Influential Factors: BMI: 48.91  kg/m Intra-procedural challenges: None observed.         Assessment challenges: None detected.              Reported side-effects: None.        Post-procedural adverse reactions or complications: None reported         Sedation: Please see nurses note. When no sedatives are used, the analgesic levels obtained are directly associated to the effectiveness of the local anesthetics. However, when sedation is provided, the level of analgesia obtained during the initial 1 hour following the intervention, is believed to be the result of a combination of factors. These factors may include, but are not limited to: 1. The effectiveness of the local anesthetics used. 2. The effects of the analgesic(s) and/or anxiolytic(s) used. 3. The degree of discomfort experienced by the patient at the time of the procedure. 4. The patients ability and reliability in recalling and recording the events. 5. The presence and influence of possible secondary gains and/or psychosocial factors. Reported result: Relief experienced during the 1st hour after the procedure:  ("I cant remember") (Ultra-Short Term Relief)            Interpretative annotation: Clinically appropriate result. Analgesia during this period is likely to be Local Anesthetic and/or IV Sedative (Analgesic/Anxiolytic) related.          Effects of local anesthetic: The analgesic effects attained during this period are directly associated to the localized infiltration of local anesthetics and therefore cary significant diagnostic value as to the etiological location, or anatomical origin, of the pain. Expected duration of relief is directly dependent on the pharmacodynamics of the local anesthetic used. Long-acting (4-6 hours) anesthetics used.  Reported result: Relief during the next 4 to 6 hour after  the procedure: 50 % (Short-Term Relief)            Interpretative annotation: Clinically appropriate result. Analgesia during this period is likely to be Local  Anesthetic-related.          Long-term benefit: Defined as the period of time past the expected duration of local anesthetics (1 hour for short-acting and 4-6 hours for long-acting). With the possible exception of prolonged sympathetic blockade from the local anesthetics, benefits during this period are typically attributed to, or associated with, other factors such as analgesic sensory neuropraxia, antiinflammatory effects, or beneficial biochemical changes provided by agents other than the local anesthetics.  Reported result: Extended relief following procedure: 50 % (neck is 90%, right arm is 0%) (Long-Term Relief)            Interpretative annotation: Clinically appropriate result. Good relief. Therapeutic success. Inflammation plays a part in the etiology to the pain.          Current benefits: Defined as persistent relief that continues at this point in time.   Reported results: Treated area: 50 %       Interpretative annotation: Recurrence of symptoms. No permanent benefit expected. Effective diagnostic intervention.          Interpretation: Results would suggest a successful diagnostic intervention.        will consider radiofrequency ablation if PT not effective.          Plan:  Please see "Plan of Care" for details.  Laboratory Chemistry  Inflammation Markers (CRP: Acute Phase) (ESR: Chronic Phase) No results found for: CRP, ESRSEDRATE               Renal Function Markers Lab Results  Component Value Date   BUN 17 03/14/2017   CREATININE 1.06 (H) 03/14/2017   GFRAA >60 03/14/2017   GFRNONAA 58 (L) 03/14/2017                 Hepatic Function Markers Lab Results  Component Value Date   AST 25 03/14/2017   ALT 15 03/14/2017   ALBUMIN 3.4 (L) 03/14/2017   ALKPHOS 97 03/14/2017                 Electrolytes Lab Results  Component Value Date   NA 137 03/14/2017   K 3.2 (L) 03/14/2017   CL 106 03/14/2017   CALCIUM 8.9 03/14/2017                 Neuropathy Markers No  results found for: [id number]               Bone Pathology Markers Lab Results  Component Value Date   ALKPHOS 97 03/14/2017   CALCIUM 8.9 03/14/2017                 Coagulation Parameters Lab Results  Component Value Date   PLT 426 03/14/2017                 Cardiovascular Markers Lab Results  Component Value Date   BNP 193.0 (H) 12/03/2016   HGB 10.7 (L) 03/14/2017   HCT 33.1 (L) 03/14/2017                 Note: Lab results reviewed.  Recent Diagnostic Imaging Review  Dg Shoulder Right  Result Date: 04/16/2017 CLINICAL DATA:  Pain after fall 1 week ago. EXAM: RIGHT SHOULDER - 2+ VIEW COMPARISON:  None. FINDINGS: There is no evidence of fracture or dislocation. There is no  evidence of arthropathy or other focal bone abnormality. Soft tissues are unremarkable. IMPRESSION: Negative. Electronically Signed   By: Dorise Bullion III M.D   On: 04/16/2017 09:29   Note: Imaging results reviewed.          Meds   Current Meds  Medication Sig  . albuterol (PROVENTIL HFA;VENTOLIN HFA) 108 (90 BASE) MCG/ACT inhaler Inhale 2 puffs into the lungs every 6 (six) hours as needed for wheezing or shortness of breath.   Marland Kitchen amLODipine (NORVASC) 10 MG tablet Take 10 mg by mouth daily.   . Fluticasone-Salmeterol (ADVAIR DISKUS) 500-50 MCG/DOSE AEPB Inhale 1 puff into the lungs 2 (two) times daily.  . GENVOYA 150-150-200-10 MG TABS tablet Take 1 tablet by mouth daily.  . hydrochlorothiazide (HYDRODIURIL) 25 MG tablet Take 25 mg by mouth daily.  Marland Kitchen ipratropium-albuterol (DUONEB) 0.5-2.5 (3) MG/3ML SOLN Inhale 3 mLs into the lungs every 6 (six) hours as needed (for wheezing/shortness of breath).   . losartan (COZAAR) 50 MG tablet Take 50 mg by mouth daily.  . methocarbamol (ROBAXIN-750) 750 MG tablet Take 1 tablet (750 mg total) by mouth 4 (four) times daily.  . naproxen (NAPROSYN) 500 MG tablet Take 1 tablet (500 mg total) by mouth 2 (two) times daily with a meal.  . omeprazole (PRILOSEC) 40 MG  capsule Take 40 mg by mouth daily.   Marland Kitchen oxyCODONE (ROXICODONE) 15 MG immediate release tablet Take 15 mg by mouth every 6 (six) hours as needed for pain.  . promethazine (PHENERGAN) 25 MG tablet Take 25 mg by mouth every 6 (six) hours as needed for nausea or vomiting.   . valACYclovir (VALTREX) 500 MG tablet Take 500 mg by mouth 2 (two) times daily.    ROS  Constitutional: Denies any fever or chills Gastrointestinal: No reported hemesis, hematochezia, vomiting, or acute GI distress Musculoskeletal: Denies any acute onset joint swelling, redness, loss of ROM, or weakness Neurological: No reported episodes of acute onset apraxia, aphasia, dysarthria, agnosia, amnesia, paralysis, loss of coordination, or loss of consciousness  Allergies  Ms. Wojdyla is allergic to morphine and related; gabapentin; and zanaflex  [tizanidine].  PFSH  Drug: Ms. Whidden  reports that she does not use drugs. Alcohol:  reports that she drinks about 3.0 oz of alcohol per week . Tobacco:  reports that she has been smoking Cigarettes.  She has smoked for the past 38.00 years. She has never used smokeless tobacco. Medical:  has a past medical history of Allergic rhinitis; Arthritis; Asthma; Blepharitis; Bronchitis; Cigarette smoker; Claustrophobia; COPD (chronic obstructive pulmonary disease) ([place]); Cough; Depression; Eye irritation; Genital herpes; GERD (gastroesophageal reflux disease); Headache; HIV (human immunodeficiency virus infection) ([place]); Hypertension; Paresthesia of hand, bilateral; and Shortness of breath dyspnea. Surgical: Ms. Ballester  has a past surgical history that includes Knee surgery (Bilateral, [phone]); Hand surgery (Bilateral); Tubal ligation; Laparoscopy for ectopic pregnancy; Bunionectomy (Bilateral); Finger fracture (Right); back surgery may 2016; Video bronchoscopy (N/A, 06/30/2015); Diagnostic laparoscopy; Back surgery; Joint replacement (Bilateral); Cardiac catheterization (N/A, 01/12/2016);  Cardiac catheterization (N/A, 01/12/2016); and Esophagogastroduodenoscopy (Left, 01/11/2016). Family: family history includes Breast cancer in her sister.  Constitutional Exam  General appearance: Well nourished, well developed, and well hydrated. In no apparent acute distress Vitals:   04/26/17 0840  BP: 137/65  Pulse: 85  Resp: 18  Temp: 98.2 F (36.8 C)  SpO2: 99%  Weight: (!) 303 lb (137.4 kg)  Height: _0  (1.676 m)   BMI Assessment: Estimated body mass index is 48.91 kg/m as calculated  from the following:   Height as of this encounter: _0  (1.676 m).   Weight as of this encounter: 303 lb (137.4 kg).  BMI interpretation table: BMI level Category Range association with higher incidence of chronic pain  <18 kg/m2 Underweight   18.5-24.9 kg/m2 Ideal body weight   25-29.9 kg/m2 Overweight Increased incidence by 20%  30-34.9 kg/m2 Obese (Class I) Increased incidence by 68%  35-39.9 kg/m2 Severe obesity (Class II) Increased incidence by 136%  >40 kg/m2 Extreme obesity (Class III) Increased incidence by 254%   BMI Readings from Last 4 Encounters:  04/26/17 48.91 kg/m  04/11/17 48.74 kg/m  04/07/17 48.74 kg/m  03/14/17 48.42 kg/m   Wt Readings from Last 4 Encounters:  04/26/17 (!) 303 lb (137.4 kg)  04/11/17 (!) 302 lb (137 kg)  04/07/17 (!) 302 lb (137 kg)  03/14/17 300 lb (136.1 kg)  Psych/Mental status: Alert, oriented x 3 (person, place, & time)       Eyes: PERLA Respiratory: No evidence of acute respiratory distress  Cervical Spine Area Exam  Skin & Axial Inspection: No masses, redness, edema, swelling, or associated skin lesions Alignment: Symmetrical Functional ROM: Improved after treatment      Stability: No instability detected Muscle Tone/Strength: Functionally intact. No obvious neuro-muscular anomalies detected. Sensory (Neurological): Movement-associated pain improved Palpation: Complains of area being tender to palpation overlying right cervical  facets             Positive Spurling's on right Upper Extremity (UE) Exam    Side: Right upper extremity  Side: Left upper extremity  Skin & Extremity Inspection: Skin color, temperature, and hair growth are WNL. No peripheral edema or cyanosis. No masses, redness, swelling, asymmetry, or associated skin lesions. No contractures.  Skin & Extremity Inspection: Skin color, temperature, and hair growth are WNL. No peripheral edema or cyanosis. No masses, redness, swelling, asymmetry, or associated skin lesions. No contractures.  Functional ROM: Pain restricted ROM          Functional ROM: Unrestricted ROM          Muscle Tone/Strength: Functionally intact. No obvious neuro-muscular anomalies detected.  Muscle Tone/Strength: Functionally intact. No obvious neuro-muscular anomalies detected.  Sensory (Neurological): Movement-associated pain          Sensory (Neurological): Unimpaired          Palpation: Complains of area being tender to palpation              Palpation: No palpable anomalies              Specialized Test(s): Deferred         Specialized Test(s): Deferred         4 out of 5 strength right upper extremity: C5 shoulder abduction, C6 elbow flexion, C7 elbow extension, C8 thumb extension primarily limited by pain. Thoracic Spine Area Exam  Skin & Axial Inspection: No masses, redness, or swelling Alignment: Symmetrical Functional ROM: Unrestricted ROM Stability: No instability detected Muscle Tone/Strength: Functionally intact. No obvious neuro-muscular anomalies detected. Sensory (Neurological): Unimpaired Muscle strength & Tone: No palpable anomalies  Lumbar Spine Area Exam  Skin & Axial Inspection: No masses, redness, or swelling Alignment: Symmetrical Functional ROM: Unrestricted ROM      Stability: No instability detected Muscle Tone/Strength: Functionally intact. No obvious neuro-muscular anomalies detected. Sensory (Neurological): Unimpaired Palpation: No palpable anomalies        Provocative Tests: Lumbar Hyperextension and rotation test: Positive       Lumbar Lateral bending test:  Positive       Patrick's Maneuver: evaluation deferred today                    Gait & Posture Assessment  Ambulation: Unassisted Gait: Relatively normal for age and body habitus Posture: WNL   Lower Extremity Exam    Side: Right lower extremity  Side: Left lower extremity  Skin & Extremity Inspection: Skin color, temperature, and hair growth are WNL. No peripheral edema or cyanosis. No masses, redness, swelling, asymmetry, or associated skin lesions. No contractures.  Skin & Extremity Inspection: Skin color, temperature, and hair growth are WNL. No peripheral edema or cyanosis. No masses, redness, swelling, asymmetry, or associated skin lesions. No contractures.  Functional ROM: Unrestricted ROM          Functional ROM: Unrestricted ROM          Muscle Tone/Strength: Functionally intact. No obvious neuro-muscular anomalies detected.  Muscle Tone/Strength: Functionally intact. No obvious neuro-muscular anomalies detected.  Sensory (Neurological): Unimpaired  Sensory (Neurological): Unimpaired  Palpation: No palpable anomalies  Palpation: No palpable anomalies   Assessment  Primary Diagnosis & Pertinent Problem List: The primary encounter diagnosis was Spondylosis, cervical, with myelopathy. Diagnoses of Osteoarthritis of spine with radiculopathy, cervical region, Morbid obesity ([place]), Cervicalgia, and Neck pain were also pertinent to this visit.  Status Diagnosis  Improving Controlled Controlled 1. Spondylosis, cervical, with myelopathy   2. Osteoarthritis of spine with radiculopathy, cervical region   3. Morbid obesity ([facility])   4. Cervicalgia   5. Neck pain      56 year old female with a past medical history of morbid obesity, lumbar degenerative disc disease, lumbar pseudoarthrosis, GERD, hypertension, HIV status, depression with neck pain that started in approximately July  with radiation down to the patient's right arm. Patient endorses occasional numbness and tingling in her right fingers and occasional weakness at times.   Patient did have a cervical MRI performed on 03/25/2017 which showed C3-C4 mild broad-based disc bulge, severe left facet arthropathy, mild right foraminal stenosis at C4-C5, broad-based disc bulge at C5-C6 with severe right and mild left foraminal stenosis. Patient's neck pain with radiation to the arm could be secondary to cervical radiculopathy from neuroforaminal stenosis, cervical spondylosis with radiculopathy, severe cervical facet disease and also from cervical myofascial pain as the patient did have significant pain with radiation to distal regions upon tactile pressure on her right trapezius.  Patient is status post right C4-C7 medial branch nerve blocks on August 13 and presents today for follow-up. Patient notes greater than 50% improvement in neck pain and range of motion but continues to endorse right arm pain which she attributes secondary to her fall. On exam today the patient has less pain with cervical extension. She notes posterior arm pain primarily in the C6-C7 dermatome.  In regards to medications, the patient was prescribed gabapentin 300 mg 3 times a day. She finds that this medication was not effective and was causing her to break out. We will trial Topamax today. No history of glaucoma or kidney stones.  Patient will continue medication management with her primary care physician. In going forward, we will consider radiofrequency ablation of the right cervical medial branch nerves in the future. Prior to proceeding with this, I would like the patient to try physical therapy and will provide a referral.   Plan: -Status post right C4-C7 medial branch nerve blocks with greater than 50% improvement of neck pain. Can consider diagnostic block #2 in the future followed  by radiofrequency ablation if patient is interested. -Stop  gabapentin, start Topamax 25 mg twice a day. Patient can continue medication management with primary care physician. I have told her that we would focus on interventional therapies. -Referral to physical therapy. -Prescription for Voltaren gel that patient can apply to neck region. -Follow up in 4-6 weeks. If Topamax and physical therapy are not effective, can consider repeating diagnostic block followed by radiofrequency ablation.   Plan of Care  Pharmacotherapy (Medications Ordered): Meds ordered this encounter  Medications  . diclofenac sodium (VOLTAREN) 1 % GEL    Sig: Apply 4 g topically 4 (four) times daily.    Dispense:  100 g    Refill:  2    Do not add this medication to the electronic "Automatic Refill" notification system. Patient may have prescription filled one day early if pharmacy is closed on scheduled refill date.  . topiramate (TOPAMAX) 25 MG capsule    Sig: Take 1 capsule (25 mg total) by mouth 2 (two) times daily.    Dispense:  60 capsule    Refill:  1   Lab-work, procedure(s), and/or referral(s): Orders Placed This Encounter  Procedures  . Ambulatory referral to Physical Therapy    Pharmacological management options:  Opioid Analgesics: I will not be prescribing any opioids at this time patient will continue medication management with primary care physician. She has not signed an opioid contract with our clinic.  Membrane stabilizer: Failed gabapentin, we will try Topamax. Can consider Lyrica. Muscle relaxant: Currently trialing Robaxin. Can consider tizanidine and baclofen in future. NSAID: Currently on naproxen Other analgesic(s): Consideration of TCA, Cymbalta, TENS therapy   Interventional management options:  Considering:   -Repeat right C4-C7 medial branch nerve block #2 followed by radiofrequency ablation  -Trigger point injections.    PRN Procedures:   To be determined at a later time   Provider-requested follow-up: Return in about 6 weeks  (around 06/07/2017).  Future Appointments Date Time Provider [place]  05/09/2017 9:20 AM [facility]  05/24/2017 8:45 AM Gillis Santa, MD [facility] None    Primary Care Physician: Theotis Burrow, MD Location: [facility] Note by: Gillis Santa, M.D Date: 04/26/2017; Time: 9:25 AM  Patient Instructions  Today we discussed the following:  #1. I'm glad that your neck pain is doing better from our nerve block. We will see how you do over the next month, if your neck pain returns, we can repeat nerve block followed by radiofrequency ablation will we burn the nerves to provide you with longer lasting pain relief.  #2. Stop gabapentin since this medication is not effective and has caused itching. Please start Topamax 25 mg twice daily.  #3. Referral to physical therapy regarding right shoulder and right upper extremity range of motion and strength exercises.  #4. Follow-up with me in 4-6 weeks. At that time we can discuss repeating cervical medial branch nerve blocks followed by radio frequency ablation.

## 2017-05-09 ENCOUNTER — Ambulatory Visit
Admission: RE | Admit: 2017-05-09 | Discharge: 2017-05-09 | Disposition: A | Discharge status: Home / Self Care | Payer: Medicare Other | Source: Ambulatory Visit | Attending: Family Medicine | Admitting: Family Medicine

## 2017-05-09 DIAGNOSIS — Z1231 Encounter for screening mammogram for malignant neoplasm of breast: Secondary | ICD-10-CM | POA: Insufficient documentation

## 2017-05-24 ENCOUNTER — Ambulatory Visit
Payer: Medicare Other | Attending: Student in an Organized Health Care Education/Training Program | Admitting: Student in an Organized Health Care Education/Training Program

## 2017-05-24 DIAGNOSIS — I10 Essential (primary) hypertension: Secondary | ICD-10-CM | POA: Diagnosis not present

## 2017-05-24 DIAGNOSIS — Z885 Allergy status to narcotic agent status: Secondary | ICD-10-CM | POA: Diagnosis not present

## 2017-05-24 DIAGNOSIS — K219 Gastro-esophageal reflux disease without esophagitis: Secondary | ICD-10-CM | POA: Diagnosis not present

## 2017-05-24 DIAGNOSIS — R05 Cough: Secondary | ICD-10-CM | POA: Diagnosis not present

## 2017-05-24 DIAGNOSIS — M79601 Pain in right arm: Secondary | ICD-10-CM | POA: Diagnosis present

## 2017-05-24 DIAGNOSIS — G8929 Other chronic pain: Secondary | ICD-10-CM | POA: Diagnosis not present

## 2017-05-24 DIAGNOSIS — M542 Cervicalgia: Secondary | ICD-10-CM

## 2017-05-24 DIAGNOSIS — J189 Pneumonia, unspecified organism: Secondary | ICD-10-CM | POA: Diagnosis not present

## 2017-05-24 DIAGNOSIS — M5106 Intervertebral disc disorders with myelopathy, lumbar region: Secondary | ICD-10-CM | POA: Insufficient documentation

## 2017-05-24 DIAGNOSIS — F329 Major depressive disorder, single episode, unspecified: Secondary | ICD-10-CM | POA: Diagnosis not present

## 2017-05-24 DIAGNOSIS — M25511 Pain in right shoulder: Secondary | ICD-10-CM | POA: Insufficient documentation

## 2017-05-24 DIAGNOSIS — B20 Human immunodeficiency virus [HIV] disease: Secondary | ICD-10-CM | POA: Diagnosis not present

## 2017-05-24 DIAGNOSIS — M4722 Other spondylosis with radiculopathy, cervical region: Secondary | ICD-10-CM

## 2017-05-24 DIAGNOSIS — K922 Gastrointestinal hemorrhage, unspecified: Secondary | ICD-10-CM | POA: Diagnosis not present

## 2017-05-24 DIAGNOSIS — M4712 Other spondylosis with myelopathy, cervical region: Secondary | ICD-10-CM

## 2017-05-24 DIAGNOSIS — D649 Anemia, unspecified: Secondary | ICD-10-CM | POA: Insufficient documentation

## 2017-05-24 DIAGNOSIS — M4802 Spinal stenosis, cervical region: Secondary | ICD-10-CM | POA: Diagnosis not present

## 2017-05-24 DIAGNOSIS — J440 Chronic obstructive pulmonary disease with acute lower respiratory infection: Secondary | ICD-10-CM | POA: Insufficient documentation

## 2017-05-24 NOTE — Progress Notes (Signed)
Safety precautions to be maintained throughout the outpatient stay will include: orient to surroundings, keep bed in low position, maintain call bell within reach at all times, provide assistance with transfer out of bed and ambulation.  

## 2017-05-24 NOTE — Patient Instructions (Addendum)
Scheduled for repeat right cervical facet block #2 with sedation.  Facet Blocks Patient Information  Description: The facets are joints in the spine between the vertebrae.  Like any joints in the body, facets can become irritated and painful.  Arthritis can also effect the facets.  By injecting steroids and local anesthetic in and around these joints, we can temporarily block the nerve supply to them.  Steroids act directly on irritated nerves and tissues to reduce selling and inflammation which often leads to decreased pain.  Facet blocks may be done anywhere along the spine from the neck to the low back depending upon the location of your pain.   After numbing the skin with local anesthetic (like Novocaine), a small needle is passed onto the facet joints under x-ray guidance.  You may experience a sensation of pressure while this is being done.  The entire block usually lasts about 15-25 minutes.   Conditions which may be treated by facet blocks:   Low back/buttock pain  Neck/shoulder pain  Certain types of headaches  Preparation for the injection:  1. Do not eat any solid food or dairy products within 8 hours of your appointment. 2. You may drink clear liquid up to 3 hours before appointment.  Clear liquids include water, black coffee, juice or soda.  No milk or cream please. 3. You may take your regular medication, including pain medications, with a sip of water before your appointment.  Diabetics should hold regular insulin (if taken separately) and take 1/2 normal NPH dose the morning of the procedure.  Carry some sugar containing items with you to your appointment. 4. A driver must accompany you and be prepared to drive you home after your procedure. 5. Bring all your current medications with you. 6. An IV may be inserted and sedation may be given at the discretion of the physician. 7. A blood pressure cuff, EKG and other monitors will often be applied during the procedure.  Some  patients may need to have extra oxygen administered for a short period. 8. You will be asked to provide medical information, including your allergies and medications, prior to the procedure.  We must know immediately if you are taking blood thinners (like Coumadin/Warfarin) or if you are allergic to IV iodine contrast (dye).  We must know if you could possible be pregnant.  Possible side-effects:   Bleeding from needle site  Infection (rare, may require surgery)  Nerve injury (rare)  Numbness & tingling (temporary)  Difficulty urinating (rare, temporary)  Spinal headache (a headache worse with upright posture)  Light-headedness (temporary)  Pain at injection site (serveral days)  Decreased blood pressure (rare, temporary)  Weakness in arm/leg (temporary)  Pressure sensation in back/neck (temporary)   Call if you experience:   Fever/chills associated with headache or increased back/neck pain  Headache worsened by an upright position  New onset, weakness or numbness of an extremity below the injection site  Hives or difficulty breathing (go to the emergency room)  Inflammation or drainage at the injection site(s)  Severe back/neck pain greater than usual  New symptoms which are concerning to you  Please note:  Although the local anesthetic injected can often make your back or neck feel good for several hours after the injection, the pain will likely return. It takes 3-7 days for steroids to work.  You may not notice any pain relief for at least one week.  If effective, we will often do a series of 2-3 injections spaced 3-6  weeks apart to maximally decrease your pain.  After the initial series, you may be a candidate for a more permanent nerve block of the facets.  If you have any questions, please call #[phone] [facility]

## 2017-05-24 NOTE — Progress Notes (Signed)
Patient's Name: Melanie Bowen  MRN: [id number]  Referring Provider: Theotis Burrow*  DOB: 14-May-1961  PCP: Theotis Burrow, MD  DOS: 05/24/2017  Note by: Gillis Santa, MD  Service setting: Ambulatory outpatient  Specialty: Interventional Pain Management  Location: [facility] (AMB) Pain Management Facility    Patient type: Established   Primary Reason(s) for Visit: Encounter for prescription drug management. (Level of risk: moderate)  CC: Arm Pain (right)  HPI  Melanie Bowen is a 56 y.o. year old, female patient, who comes today for a medication management evaluation. She has Asthma, chronic; DDD (degenerative disc disease), lumbar; Degenerative disc disease, lumbar; Cough; Chronic cough; Lumbar pseudoarthrosis; GI bleed; GERD (gastroesophageal reflux disease); HTN (hypertension); Depression; HIV (human immunodeficiency virus infection) ([place]); Anemia; Symptomatic anemia; Lower GI bleed; Tobacco abuse counseling; Allergic rhinitis; Community acquired pneumonia; and Pneumonia on her problem list. Her primarily concern today is the Arm Pain (right)  Pain Assessment: Location: Right Arm Radiating: n/a Onset: More than a month ago Duration: Chronic pain Quality: Throbbing, Sharp, Numbness Severity: 8 /10 (self-reported pain score)  Note: Reported level is compatible with observation.                   When using our objective Pain Scale, any level above a 5/10 is said to belong in an emergency room, as it progressively worsens from a 6/10, which is described as severely limiting. Requiring emergency care not usually available at an outpatient pain management facility. Communication becomes difficult and requires great effort. Assistance to reach the emergency department may be required. Facial flushing and profuse sweating along with potentially dangerous increases in heart rate and blood pressure will be evident. Effect on ADL:   Timing: Constant Modifying factors: rom  Melanie Bowen was  last scheduled for an appointment on 04/26/2017 for medication management. During today's appointment we reviewed Melanie Bowen's chronic pain status, as well as her outpatient medication regimen.  The patient  reports that she does not use drugs. Her body mass index is unknown because there is no height or weight on file.  56 year old female with a past medical history of morbid obesity, lumbar degenerative disc disease, lumbar pseudoarthrosis, GERD, hypertension, HIV status, depression with neck pain that started in approximately July with radiation down to the patient's right arm. Patient endorses occasional numbness and tingling in her right fingers and occasional weakness at times.   Patient did have a cervical MRI performed on 03/25/2017 which showed C3-C4 mild broad-based disc bulge, severe left facet arthropathy, mild right foraminal stenosis at C4-C5, broad-based disc bulge at C5-C6 with severe right and mild left foraminal stenosis. Patient's neck pain with radiation to the arm could be secondary to cervical radiculopathy from neuroforaminal stenosis, cervical spondylosis with radiculopathy, severe cervical facet disease and also from cervical myofascial pain as the patient did have significant pain with radiation to distal regions upon tactile pressure on her right trapezius.  Patient is status post right C4-C7 medial branch nerve blocks on August 13. Patient had approximately 80% neck pain and right shoulder pain relief 5 days after the procedure which decreased about 50% pain relief at her follow-up appointment on 04/26/2017. Since she was doing relatively well at that visit, she wanted to wait until her pain got worse to proceed with diagnostic block #2.  Patient presents today with worsening right neck and shoulder pain. She describes this as aching, throbbing, sharp, shooting. She has difficulty raising objects and reduced range of motion. Patient was very  tearful in the  interview.  Further details on both, my assessment(s), as well as the proposed treatment plan, please see below.  Controlled Substance Pharmacotherapy Assessment REMS (Risk Evaluation and Mitigation Strategy)  05/19/2017 1 04/26/2017 OXYCODONE HCL 15 MG TABLET 90 23 HE POO [id number] GAR(0172) 0 88.04 MME Medicare [place] Opioid management per PCP  Landis Martins, RN  05/24/2017 10:33 AM  Sign at close encounter Safety precautions to be maintained throughout the outpatient stay will include: orient to surroundings, keep bed in low position, maintain call bell within reach at all times, provide assistance with transfer out of bed and ambulation.    Pharmacokinetics: Liberation and absorption (onset of action): WNL Distribution (time to peak effect): WNL Metabolism and excretion (duration of action): WNL         Pharmacodynamics: Desired effects: Analgesia: Melanie Bowen reports >50% benefit. Functional ability: Patient reports that medication allows her to accomplish basic ADLs Clinically meaningful improvement in function (CMIF): Sustained CMIF goals met Perceived effectiveness: Described as relatively effective, allowing for increase in activities of daily living (ADL) Undesirable effects: Side-effects or Adverse reactions: None reported Monitoring: [place] PMP: Online review of the past 44-monthperiod conducted. Compliant with practice rules and regulations Last UDS on record: No results found for: SUMMARY UDS interpretation: Not applicable          Medication Assessment Form: N/A Treatment compliance: NA Risk Assessment Profile: Aberrant behavior: See prior evaluations. None observed or detected today Comorbid factors increasing risk of overdose: See prior notes. No additional risks detected today Risk of substance use disorder (SUD): Low     Opioid Risk Tool - 04/11/17 0912      Family History of Substance Abuse   Alcohol Positive Female   Illegal Drugs Negative   Rx Drugs Negative      Personal History of Substance Abuse   Alcohol Negative   Illegal Drugs Negative   Rx Drugs Negative     Psychological Disease   Psychological Disease Negative   Depression Negative     Total Score   Opioid Risk Tool Scoring 1   Opioid Risk Interpretation Low Risk     ORT Scoring interpretation table:  Score <3 = Low Risk for SUD  Score between 4-7 = Moderate Risk for SUD  Score >8 = High Risk for Opioid Abuse    Pharmacologic Plan: medication is specifically opioid management per PCP.  Laboratory Chemistry  Inflammation Markers (CRP: Acute Phase) (ESR: Chronic Phase) No results found for: CRP, ESRSEDRATE               Renal Function Markers Lab Results  Component Value Date   BUN 17 03/14/2017   CREATININE 1.06 (H) 03/14/2017   GFRAA >60 03/14/2017   GFRNONAA 58 (L) 03/14/2017                 Hepatic Function Markers Lab Results  Component Value Date   AST 25 03/14/2017   ALT 15 03/14/2017   ALBUMIN 3.4 (L) 03/14/2017   ALKPHOS 97 03/14/2017                 Electrolytes Lab Results  Component Value Date   NA 137 03/14/2017   K 3.2 (L) 03/14/2017   CL 106 03/14/2017   CALCIUM 8.9 03/14/2017                 Neuropathy Markers No results found for: [id number]  Bone Pathology Markers Lab Results  Component Value Date   ALKPHOS 97 03/14/2017   CALCIUM 8.9 03/14/2017                 Coagulation Parameters Lab Results  Component Value Date   PLT 426 03/14/2017                 Cardiovascular Markers Lab Results  Component Value Date   BNP 193.0 (H) 12/03/2016   HGB 10.7 (L) 03/14/2017   HCT 33.1 (L) 03/14/2017                 Note: Lab results reviewed.  Recent Diagnostic Imaging Results  MM SCREENING BREAST TOMO BILATERAL CLINICAL DATA:  Screening.  EXAM: 2D DIGITAL SCREENING BILATERAL MAMMOGRAM WITH CAD AND ADJUNCT TOMO  COMPARISON:  Previous exam(s).  ACR Breast Density Category b: There are scattered areas  of fibroglandular density.  FINDINGS: There are no findings suspicious for malignancy. Images were processed with CAD.  IMPRESSION: No mammographic evidence of malignancy. A result letter of this screening mammogram will be mailed directly to the patient.  RECOMMENDATION: Screening mammogram in one year. (Code:[id number])  BI-RADS CATEGORY  1: Negative.  Electronically Signed   By: Fidela Salisbury M.D.   On: 05/10/2017 11:04  Note: Imaging results reviewed.          Meds   Current Outpatient Prescriptions:  .  albuterol (PROVENTIL HFA;VENTOLIN HFA) 108 (90 BASE) MCG/ACT inhaler, Inhale 2 puffs into the lungs every 6 (six) hours as needed for wheezing or shortness of breath. , Disp: , Rfl:  .  amLODipine (NORVASC) 10 MG tablet, Take 10 mg by mouth daily. , Disp: , Rfl:  .  DEXILANT 60 MG capsule, , Disp: , Rfl:  .  Fluticasone-Salmeterol (ADVAIR DISKUS) 500-50 MCG/DOSE AEPB, Inhale 1 puff into the lungs 2 (two) times daily., Disp: 60 each, Rfl: 5 .  GENVOYA 150-150-200-10 MG TABS tablet, Take 1 tablet by mouth daily., Disp: , Rfl:  .  hydrochlorothiazide (HYDRODIURIL) 25 MG tablet, Take 25 mg by mouth daily., Disp: , Rfl:  .  ipratropium-albuterol (DUONEB) 0.5-2.5 (3) MG/3ML SOLN, Inhale 3 mLs into the lungs every 6 (six) hours as needed (for wheezing/shortness of breath). , Disp: , Rfl:  .  losartan (COZAAR) 50 MG tablet, Take 50 mg by mouth daily., Disp: , Rfl:  .  omeprazole (PRILOSEC) 40 MG capsule, Take 40 mg by mouth daily. , Disp: , Rfl:  .  oxyCODONE (ROXICODONE) 15 MG immediate release tablet, Take 15 mg by mouth every 6 (six) hours as needed for pain., Disp: , Rfl:  .  promethazine (PHENERGAN) 25 MG tablet, Take 25 mg by mouth every 6 (six) hours as needed for nausea or vomiting. , Disp: , Rfl:  .  topiramate (TOPAMAX) 25 MG capsule, Take 1 capsule (25 mg total) by mouth 2 (two) times daily., Disp: 60 capsule, Rfl: 1 .  valACYclovir (VALTREX) 500 MG tablet, Take 500 mg  by mouth 2 (two) times daily., Disp: , Rfl:  .  amoxicillin-clavulanate (AUGMENTIN) 875-125 MG tablet, Take 1 tablet by mouth 2 (two) times daily. (Patient not taking: Reported on 04/07/2017), Disp: 10 tablet, Rfl: 0 .  benzonatate (TESSALON PERLES) 100 MG capsule, Take 1 capsule (100 mg total) by mouth every 6 (six) hours as needed for cough. (Patient not taking: Reported on 04/11/2017), Disp: 30 capsule, Rfl: 0 .  chlorpheniramine-HYDROcodone (TUSSIONEX PENNKINETIC ER) 10-8 MG/5ML SUER, Take 5 mLs by mouth 2 (  two) times daily. (Patient not taking: Reported on 04/07/2017), Disp: 140 mL, Rfl: 0 .  diclofenac sodium (VOLTAREN) 1 % GEL, Apply 4 g topically 4 (four) times daily. (Patient not taking: Reported on 05/24/2017), Disp: 100 g, Rfl: 2 .  dicyclomine (BENTYL) 10 MG capsule, Take 1 capsule (10 mg total) by mouth 3 (three) times daily as needed (Abdominal cramping). (Patient not taking: Reported on 11/28/2016), Disp: 10 capsule, Rfl: 0 .  efavirenz-emtricitabine-tenofovir (ATRIPLA) 600-200-300 MG per tablet, Take 1 tablet by mouth at bedtime., Disp: , Rfl:  .  guaiFENesin-codeine 100-10 MG/5ML syrup, Take 5 mLs by mouth every 6 (six) hours as needed for cough. (Patient not taking: Reported on 04/07/2017), Disp: 120 mL, Rfl: 0 .  magic mouthwash SOLN, Take 5 mLs by mouth 4 (four) times daily as needed for mouth pain. (Patient not taking: Reported on 04/07/2017), Disp: 15 mL, Rfl: 0 .  meloxicam (MOBIC) 15 MG tablet, Take 1 tablet (15 mg total) by mouth daily. (Patient not taking: Reported on 04/07/2017), Disp: 30 tablet, Rfl: 0 .  methocarbamol (ROBAXIN-750) 750 MG tablet, Take 1 tablet (750 mg total) by mouth 4 (four) times daily. (Patient not taking: Reported on 05/24/2017), Disp: 20 tablet, Rfl: 0 .  naproxen (NAPROSYN) 500 MG tablet, Take 1 tablet (500 mg total) by mouth 2 (two) times daily with a meal. (Patient not taking: Reported on 05/24/2017), Disp: 10 tablet, Rfl: 00 .  polyethylene glycol (MIRALAX /  GLYCOLAX) packet, Take 17 g by mouth daily. (Patient not taking: Reported on 04/07/2017), Disp: 14 each, Rfl: 0 .  predniSONE (DELTASONE) 20 MG tablet, Take 2 tablets (40 mg total) by mouth daily. (Patient not taking: Reported on 04/07/2017), Disp: 8 tablet, Rfl: 0 .  predniSONE (STERAPRED UNI-PAK 21 TAB) 10 MG (21) TBPK tablet, Take by mouth daily. Dispense steroid taper pack as instructed (Patient not taking: Reported on 04/07/2017), Disp: 21 tablet, Rfl: 0 .  traZODone (DESYREL) 100 MG tablet, , Disp: , Rfl:   ROS  Constitutional: Denies any fever or chills Gastrointestinal: No reported hemesis, hematochezia, vomiting, or acute GI distress Musculoskeletal: Denies any acute onset joint swelling, redness, loss of ROM, or weakness Neurological: No reported episodes of acute onset apraxia, aphasia, dysarthria, agnosia, amnesia, paralysis, loss of coordination, or loss of consciousness  Allergies  Ms. Hebenstreit is allergic to morphine and related; gabapentin; and zanaflex  [tizanidine].  PFSH  Drug: Ms. Hapke  reports that she does not use drugs. Alcohol:  reports that she drinks about 3.0 oz of alcohol per week . Tobacco:  reports that she has been smoking Cigarettes.  She has smoked for the past 38.00 years. She has never used smokeless tobacco. Medical:  has a past medical history of Allergic rhinitis; Arthritis; Asthma; Blepharitis; Bronchitis; Cigarette smoker; Claustrophobia; COPD (chronic obstructive pulmonary disease) ([place]); Cough; Depression; Eye irritation; Genital herpes; GERD (gastroesophageal reflux disease); Headache; HIV (human immunodeficiency virus infection) ([place]); Hypertension; Paresthesia of hand, bilateral; and Shortness of breath dyspnea. Surgical: Ms. Jimmerson  has a past surgical history that includes Knee surgery (Bilateral, [phone]); Hand surgery (Bilateral); Tubal ligation; Laparoscopy for ectopic pregnancy; Bunionectomy (Bilateral); Finger fracture (Right); back surgery may  2016; Video bronchoscopy (N/A, 06/30/2015); Diagnostic laparoscopy; Back surgery; Joint replacement (Bilateral); Cardiac catheterization (N/A, 01/12/2016); Cardiac catheterization (N/A, 01/12/2016); and Esophagogastroduodenoscopy (Left, 01/11/2016). Family: family history includes Breast cancer in her sister.  Constitutional Exam  General appearance: Well nourished, well developed, and well hydrated. In no apparent acute distress There were no vitals  filed for this visit. BMI Assessment: Estimated body mass index is 48.91 kg/m as calculated from the following:   Height as of 04/26/17: _0  (1.676 m).   Weight as of 04/26/17: 303 lb (137.4 kg).  BMI interpretation table: BMI level Category Range association with higher incidence of chronic pain  <18 kg/m2 Underweight   18.5-24.9 kg/m2 Ideal body weight   25-29.9 kg/m2 Overweight Increased incidence by 20%  30-34.9 kg/m2 Obese (Class I) Increased incidence by 68%  35-39.9 kg/m2 Severe obesity (Class II) Increased incidence by 136%  >40 kg/m2 Extreme obesity (Class III) Increased incidence by 254%   BMI Readings from Last 4 Encounters:  04/26/17 48.91 kg/m  04/11/17 48.74 kg/m  04/07/17 48.74 kg/m  03/14/17 48.42 kg/m   Wt Readings from Last 4 Encounters:  04/26/17 (!) 303 lb (137.4 kg)  04/11/17 (!) 302 lb (137 kg)  04/07/17 (!) 302 lb (137 kg)  03/14/17 300 lb (136.1 kg)  Psych/Mental status: Alert, oriented x 3 (person, place, & time)       Eyes: PERLA Respiratory: No evidence of acute respiratory distress  Cervical Spine Area Exam  Skin & Axial Inspection: No masses, redness, edema, swelling, or associated skin lesions Alignment: Symmetrical Functional ROM: Improved after treatment      Stability: No instability detected Muscle Tone/Strength: Functionally intact. No obvious neuro-muscular anomalies detected. Sensory (Neurological): Movement-associated pain improved Palpation: Complains of area being tender to palpation  overlying right cervical facets and periscapular region             Positive Spurling's on right       Upper Extremity (UE) Exam    Side: Right upper extremity  Side: Left upper extremity   Skin & Extremity Inspection: Skin color, temperature, and hair growth are WNL. No peripheral edema or cyanosis. No masses, redness, swelling, asymmetry, or associated skin lesions. No contractures.  Skin & Extremity Inspection: Skin color, temperature, and hair growth are WNL. No peripheral edema or cyanosis. No masses, redness, swelling, asymmetry, or associated skin lesions. No contractures.   Functional ROM: Pain restricted ROM          Functional ROM: Unrestricted ROM           Muscle Tone/Strength: Functionally intact. No obvious neuro-muscular anomalies detected.  Muscle Tone/Strength: Functionally intact. No obvious neuro-muscular anomalies detected.   Sensory (Neurological): Movement-associated pain          Sensory (Neurological): Unimpaired           Palpation: Complains of area being tender to palpation              Palpation: No palpable anomalies               Specialized Test(s): Deferred         Specialized Test(s): Deferred          4 out of 5 strength right upper extremity: C5 shoulder abduction, C6 elbow flexion, C7 elbow extension, C8 thumb extension primarily limited by pain.  Thoracic Spine Area Exam  Skin & Axial Inspection: No masses, redness, or swelling Alignment: Symmetrical Functional ROM: Unrestricted ROM Stability: No instability detected Muscle Tone/Strength: Functionally intact. No obvious neuro-muscular anomalies detected. Sensory (Neurological): Unimpaired Muscle strength & Tone: No palpable anomalies  Lumbar Spine Area Exam  Skin & Axial Inspection: No masses, redness, or swelling Alignment: Symmetrical Functional ROM: Unrestricted ROM      Stability: No instability detected Muscle Tone/Strength: Functionally intact. No obvious neuro-muscular anomalies  detected. Sensory (  Neurological): Unimpaired Palpation: No palpable anomalies       Provocative Tests: Lumbar Hyperextension and rotation test: evaluation deferred today       Lumbar Lateral bending test: evaluation deferred today       Patrick's Maneuver: evaluation deferred today                    Gait & Posture Assessment  Ambulation: Unassisted Gait: Relatively normal for age and body habitus Posture: WNL   Lower Extremity Exam    Side: Right lower extremity  Side: Left lower extremity  Skin & Extremity Inspection: Skin color, temperature, and hair growth are WNL. No peripheral edema or cyanosis. No masses, redness, swelling, asymmetry, or associated skin lesions. No contractures.  Skin & Extremity Inspection: Skin color, temperature, and hair growth are WNL. No peripheral edema or cyanosis. No masses, redness, swelling, asymmetry, or associated skin lesions. No contractures.  Functional ROM: Unrestricted ROM          Functional ROM: Unrestricted ROM          Muscle Tone/Strength: Functionally intact. No obvious neuro-muscular anomalies detected.  Muscle Tone/Strength: Functionally intact. No obvious neuro-muscular anomalies detected.  Sensory (Neurological): Unimpaired  Sensory (Neurological): Unimpaired  Palpation: No palpable anomalies  Palpation: No palpable anomalies   Assessment  Primary Diagnosis & Pertinent Problem List: The primary encounter diagnosis was Spondylosis, cervical, with myelopathy. Diagnoses of Osteoarthritis of spine with radiculopathy, cervical region, Morbid obesity ([place]), Cervicalgia, Neck pain, and HIV disease ([place]) were also pertinent to this visit.  Status Diagnosis  Having a Flare-up Responding Stable 1. Spondylosis, cervical, with myelopathy   2. Osteoarthritis of spine with radiculopathy, cervical region   3. Morbid obesity ([facility])   4. Cervicalgia   5. Neck pain   6. HIV disease ([place])      56 year old female with a past medical history of morbid  obesity, lumbar degenerative disc disease, lumbar pseudoarthrosis, GERD, hypertension, HIV status, depression with neck pain that started in approximately July with radiation down to the patient's right arm. Patient endorses occasional numbness and tingling in her right fingers and occasional weakness at times.   Patient did have a cervical MRI performed on 03/25/2017 which showed C3-C4 mild broad-based disc bulge, severe left facet arthropathy, mild right foraminal stenosis at C4-C5, broad-based disc bulge at C5-C6 with severe right and mild left foraminal stenosis. Patient's neck pain with radiation to the arm could be secondary to cervical radiculopathy from neuroforaminal stenosis, cervical spondylosis with radiculopathy, severe cervical facet disease and also from cervical myofascial pain as the patient did have significant pain with radiation to distal regions upon tactile pressure on her right trapezius.  Patient is status post right C4-C7 medial branch nerve blocks on August 13. Patient had approximately 80% neck pain and right shoulder pain relief 5 days after the procedure which decreased about 50% pain relief at her follow-up appointment on 04/26/2017. Since she was doing relatively well at that visit, she wanted to wait until her pain got worse to proceed with diagnostic block #2. Patient has been doing physical therapy and states that it is aggravating her right shoulder pain.  She presents today with worsening right neck and shoulder pain.   Plan to proceed with diagnostic block #2 of right C4, C5, C6, C7 facet medial branch nerves with goal radiofrequency ablation.  Plan of Care  Pharmacotherapy (Medications Ordered): No orders of the defined types were placed in this encounter.  Lab-work, procedure(s), and/or referral(s): Orders  Placed This Encounter  Procedures  . CERVICAL FACET (MEDIAL BRANCH NERVE BLOCK)     Pharmacological management options:  Opioid Analgesics: I will not  be prescribing any opioids at this time patient will continue medication management with primary care physician. She has not signed an opioid contract with our clinic.  Membrane stabilizer: Failed gabapentin, we will try Topamax. Can consider Lyrica. Muscle relaxant: Currently trialing Robaxin. Can consider tizanidine and baclofen in future. NSAID: Currently on naproxen Other analgesic(s): Consideration of TCA, Cymbalta, TENS therapy   Interventional management options:  Considering:   -Repeat right C4-C7 medial branch nerve block #2 followed by radiofrequency ablation  -Trigger point injections.    PRN Procedures:   To be determined at a later time    Provider-requested follow-up: Return for Procedure.  Future Appointments Date Time Provider [place]  06/06/2017 10:00 AM Gillis Santa, MD [facility] None    Primary Care Physician: Theotis Burrow, MD Location: [facility] Note by: Gillis Santa, M.D Date: 05/24/2017; Time: 10:54 AM  Patient Instructions  Scheduled for repeat right cervical facet block #2 with sedation.  Facet Blocks Patient Information  Description: The facets are joints in the spine between the vertebrae.  Like any joints in the body, facets can become irritated and painful.  Arthritis can also effect the facets.  By injecting steroids and local anesthetic in and around these joints, we can temporarily block the nerve supply to them.  Steroids act directly on irritated nerves and tissues to reduce selling and inflammation which often leads to decreased pain.  Facet blocks may be done anywhere along the spine from the neck to the low back depending upon the location of your pain.   After numbing the skin with local anesthetic (like Novocaine), a small needle is passed onto the facet joints under x-ray guidance.  You may experience a sensation of pressure while this is being done.  The entire block usually lasts about  15-25 minutes.   Conditions which may be treated by facet blocks:   Low back/buttock pain  Neck/shoulder pain  Certain types of headaches  Preparation for the injection:  1. Do not eat any solid food or dairy products within 8 hours of your appointment. 2. You may drink clear liquid up to 3 hours before appointment.  Clear liquids include water, black coffee, juice or soda.  No milk or cream please. 3. You may take your regular medication, including pain medications, with a sip of water before your appointment.  Diabetics should hold regular insulin (if taken separately) and take 1/2 normal NPH dose the morning of the procedure.  Carry some sugar containing items with you to your appointment. 4. A driver must accompany you and be prepared to drive you home after your procedure. 5. Bring all your current medications with you. 6. An IV may be inserted and sedation may be given at the discretion of the physician. 7. A blood pressure cuff, EKG and other monitors will often be applied during the procedure.  Some patients may need to have extra oxygen administered for a short period. 8. You will be asked to provide medical information, including your allergies and medications, prior to the procedure.  We must know immediately if you are taking blood thinners (like Coumadin/Warfarin) or if you are allergic to IV iodine contrast (dye).  We must know if you could possible be pregnant.  Possible side-effects:   Bleeding from needle site  Infection (rare, may require surgery)  Nerve injury (rare)  Numbness & tingling (temporary)  Difficulty urinating (rare, temporary)  Spinal headache (a headache worse with upright posture)  Light-headedness (temporary)  Pain at injection site (serveral days)  Decreased blood pressure (rare, temporary)  Weakness in arm/leg (temporary)  Pressure sensation in back/neck (temporary)   Call if you experience:   Fever/chills associated with headache  or increased back/neck pain  Headache worsened by an upright position  New onset, weakness or numbness of an extremity below the injection site  Hives or difficulty breathing (go to the emergency room)  Inflammation or drainage at the injection site(s)  Severe back/neck pain greater than usual  New symptoms which are concerning to you  Please note:  Although the local anesthetic injected can often make your back or neck feel good for several hours after the injection, the pain will likely return. It takes 3-7 days for steroids to work.  You may not notice any pain relief for at least one week.  If effective, we will often do a series of 2-3 injections spaced 3-6 weeks apart to maximally decrease your pain.  After the initial series, you may be a candidate for a more permanent nerve block of the facets.  If you have any questions, please call #[phone]) [facility]

## 2017-06-06 ENCOUNTER — Encounter: Payer: Self-pay | Admitting: Student in an Organized Health Care Education/Training Program

## 2017-06-06 ENCOUNTER — Ambulatory Visit
Admission: RE | Admit: 2017-06-06 | Discharge: 2017-06-06 | Disposition: A | Discharge status: Home / Self Care | Payer: Medicare Other | Source: Ambulatory Visit | Attending: Student in an Organized Health Care Education/Training Program | Admitting: Student in an Organized Health Care Education/Training Program

## 2017-06-06 ENCOUNTER — Ambulatory Visit (HOSPITAL_BASED_OUTPATIENT_CLINIC_OR_DEPARTMENT_OTHER): Payer: Medicare Other | Admitting: Student in an Organized Health Care Education/Training Program

## 2017-06-06 VITALS — BP 133/66 | HR 71 | Temp 97.6°F | Resp 18 | Ht 66.0 in | Wt 300.0 lb

## 2017-06-06 DIAGNOSIS — Z6841 Body Mass Index (BMI) 40.0 and over, adult: Secondary | ICD-10-CM | POA: Insufficient documentation

## 2017-06-06 DIAGNOSIS — M4722 Other spondylosis with radiculopathy, cervical region: Secondary | ICD-10-CM

## 2017-06-06 DIAGNOSIS — M542 Cervicalgia: Secondary | ICD-10-CM | POA: Diagnosis present

## 2017-06-06 DIAGNOSIS — M4712 Other spondylosis with myelopathy, cervical region: Secondary | ICD-10-CM | POA: Insufficient documentation

## 2017-06-06 MED ORDER — LIDOCAINE HCL (PF) 1 % IJ SOLN
10.0000 mL | Freq: Once | INTRAMUSCULAR | Status: AC
  Administered 2017-06-06: 5 mL
  Filled 2017-06-06: qty 10

## 2017-06-06 MED ORDER — FENTANYL CITRATE (PF) 100 MCG/2ML IJ SOLN
25.0000 ug | INTRAMUSCULAR | Status: DC | PRN
Start: 2017-06-06 — End: 2017-06-06
  Administered 2017-06-06: 100 ug via INTRAVENOUS
  Filled 2017-06-06: qty 2

## 2017-06-06 MED ORDER — ROPIVACAINE HCL 2 MG/ML IJ SOLN
9.0000 mL | Freq: Once | INTRAMUSCULAR | Status: AC
  Administered 2017-06-06: 10 mL via PERINEURAL
  Filled 2017-06-06: qty 10

## 2017-06-06 NOTE — Progress Notes (Signed)
Patient's Name: Melanie Bowen  MRN: [id number]  Referring Provider: Theotis Burrow*  DOB: 1960-12-15  PCP: Theotis Burrow, MD  DOS: 06/06/2017  Note by: Gillis Santa, MD  Service setting: Ambulatory outpatient  Specialty: Interventional Pain Management  Patient type: Established  Location: [facility] (AMB) Pain Management Facility  Visit type: Interventional Procedure   Primary Reason for Visit: Interventional Pain Management Treatment. CC: Neck Pain (right)  Procedure:  Anesthesia, Analgesia, Anxiolysis:  Type: Diagnostic Cervical Facet Medial Branch Block #2 Region: Posterolateral cervical spine region Level:  C4, C5, C6, & C7 Medial Branch Level(s) Laterality: Right Paraspinal  Type: Local Anesthesia with Moderate (Conscious) Sedation Local Anesthetic: Lidocaine 1% Route: Intravenous (IV) IV Access: Secured Sedation: Meaningful verbal contact was maintained at all times during the procedure  Indication(s): Analgesia and Anxiety   Indications: 1. Spondylosis, cervical, with myelopathy   2. Osteoarthritis of spine with radiculopathy, cervical region   3. Morbid obesity (HCC)   4. Cervicalgia    Pain Score: Pre-procedure: 9 /10 Post-procedure: 0-No pain/10  Pre-op Assessment:  Melanie Bowen is a 56 y.o. (year old), female patient, seen today for interventional treatment. She  has a past surgical history that includes Knee surgery (Bilateral, [id number]); Hand surgery (Bilateral); Tubal ligation; Laparoscopy for ectopic pregnancy; Bunionectomy (Bilateral); Finger fracture (Right); back surgery may 2016; Video bronchoscopy (N/A, 06/30/2015); Diagnostic laparoscopy; Back surgery; Joint replacement (Bilateral); Cardiac catheterization (N/A, 01/12/2016); Cardiac catheterization (N/A, 01/12/2016); and Esophagogastroduodenoscopy (Left, 01/11/2016). Melanie Bowen has a current medication list which includes the following prescription(s): albuterol, amlodipine, dexilant,  efavirenz-emtricitabine-tenofovir, fluticasone-salmeterol, genvoya, hydrochlorothiazide, ipratropium-albuterol, losartan, magic mouthwash, omeprazole, oxycodone, promethazine, topiramate, trazodone, valacyclovir, amoxicillin-clavulanate, benzonatate, chlorpheniramine-hydrocodone, diclofenac sodium, dicyclomine, guaifenesin-codeine, meloxicam, methocarbamol, naproxen, polyethylene glycol, prednisone, and prednisone, and the following Facility-Administered Medications: fentanyl. Her primarily concern today is the Neck Pain (right)  Initial Vital Signs: Blood pressure 118/70, pulse 82, temperature 98 F (36.7 C), temperature source Oral, resp. rate 16, height 5\' 6"  (1.676 m), weight 300 lb (136.1 kg), SpO2 100 %. BMI: Estimated body mass index is 48.42 kg/m as calculated from the following:   Height as of this encounter: 5\' 6"  (1.676 m).   Weight as of this encounter: 300 lb (136.1 kg).  Risk Assessment: Allergies: Reviewed. She is allergic to morphine and related; gabapentin; and zanaflex  [tizanidine].  Allergy Precautions: None required Coagulopathies: Reviewed. None identified.  Blood-thinner therapy: None at this time Active Infection(s): Reviewed. None identified. Melanie Bowen is afebrile  Site Confirmation: Melanie Bowen was asked to confirm the procedure and laterality before marking the site Procedure checklist: Completed Consent: Before the procedure and under the influence of no sedative(s), amnesic(s), or anxiolytics, the patient was informed of the treatment options, risks and possible complications. To fulfill our ethical and legal obligations, as recommended by the American Medical Association's Code of Ethics, I have informed the patient of my clinical impression; the nature and purpose of the treatment or procedure; the risks, benefits, and possible complications of the intervention; the alternatives, including doing nothing; the risk(s) and benefit(s) of the alternative treatment(s) or  procedure(s); and the risk(s) and benefit(s) of doing nothing. The patient was provided information about the general risks and possible complications associated with the procedure. These may include, but are not limited to: failure to achieve desired goals, infection, bleeding, organ or nerve damage, allergic reactions, paralysis, and death. In addition, the patient was informed of those risks and complications associated to Spine-related procedures, such as failure to decrease pain; infection (i.e.: Meningitis,  epidural or intraspinal abscess); bleeding (i.e.: epidural hematoma, subarachnoid hemorrhage, or any other type of intraspinal or peri-dural bleeding); organ or nerve damage (i.e.: Any type of peripheral nerve, nerve root, or spinal cord injury) with subsequent damage to sensory, motor, and/or autonomic systems, resulting in permanent pain, numbness, and/or weakness of one or several areas of the body; allergic reactions; (i.e.: anaphylactic reaction); and/or death. Furthermore, the patient was informed of those risks and complications associated with the medications. These include, but are not limited to: allergic reactions (i.e.: anaphylactic or anaphylactoid reaction(s)); adrenal axis suppression; blood sugar elevation that in diabetics may result in ketoacidosis or comma; water retention that in patients with history of congestive heart failure may result in shortness of breath, pulmonary edema, and decompensation with resultant heart failure; weight gain; swelling or edema; medication-induced neural toxicity; particulate matter embolism and blood vessel occlusion with resultant organ, and/or nervous system infarction; and/or aseptic necrosis of one or more joints. Finally, the patient was informed that Medicine is not an exact science; therefore, there is also the possibility of unforeseen or unpredictable risks and/or possible complications that may result in a catastrophic outcome. The patient  indicated having understood very clearly. We have given the patient no guarantees and we have made no promises. Enough time was given to the patient to ask questions, all of which were answered to the patient's satisfaction. Melanie Bowen has indicated that she wanted to continue with the procedure. Attestation: I, the ordering provider, attest that I have discussed with the patient the benefits, risks, side-effects, alternatives, likelihood of achieving goals, and potential problems during recovery for the procedure that I have provided informed consent. Date: 06/06/2017; Time: 11:02 AM  Pre-Procedure Preparation:  Monitoring: As per clinic protocol. Respiration, ETCO2, SpO2, BP, heart rate and rhythm monitor placed and checked for adequate function Safety Precautions: Patient was assessed for positional comfort and pressure points before starting the procedure. Time-out: I initiated and conducted the "Time-out" before starting the procedure, as per protocol. The patient was asked to participate by confirming the accuracy of the "Time Out" information. Verification of the correct person, site, and procedure were performed and confirmed by me, the nursing staff, and the patient. "Time-out" conducted as per Joint Commission's Universal Protocol (UP.01.01.01). "Time-out" Date & Time: 06/06/2017; 1128 hrs.  Description of Procedure Process:   Position: Prone with head of the table was raised to facilitate breathing. Target Area: For Cervical Facet blocks, the target is the postero-lateral waist of the articular pillars at the  C4, C5, C6, & C7 levels. Approach: Posterior approach. Area Prepped: Entire Posterior Cervico-thoracic Region Prepping solution: ChloraPrep (2% chlorhexidine gluconate and 70% isopropyl alcohol) Safety Precautions: Aspiration looking for blood return was conducted prior to all injections. At no point did we inject any substances, as a needle was being advanced. No attempts were made  at seeking any paresthesias. Safe injection practices and needle disposal techniques used. Medications properly checked for expiration dates. SDV (single dose vial) medications used. Description of the Procedure: Protocol guidelines were followed. The patient was placed in position over the fluoroscopy table. The target area was identified and the area prepped in the usual manner. Skin desensitized using vapocoolant spray. Skin & deeper tissues infiltrated with local anesthetic. Appropriate amount of time allowed to pass for local anesthetics to take effect. The procedure needle was introduced through the skin, ipsilateral to the reported pain, and advanced to the target area. Bone was contacted on the posterior aspect of the articular pillars  and the needle walked lateral, until the border was cleared. Lateral views taken to make sure the needle tip did not advance past the posterior third of the lateral mass of the posterior columns. The procedure was repeated in identical fashion for each level. Negative aspiration confirmed. Solution injected in intermittent fashion, asking for systemic symptoms every 0.5cc of injectate. The needles were then removed and the area cleansed, making sure to leave some of the prepping solution back to take advantage of its long term bactericidal properties. Vitals:   06/06/17 1145 06/06/17 1150 06/06/17 1158 06/06/17 1209  BP: 117/64 117/64 115/77 133/66  Pulse: 66 66 74 71  Resp: 13 13 17 18   Temp:    97.6 F (36.4 C)  TempSrc:      SpO2: 99% 99% 99% 99%  Weight:      Height:        Start Time: 1130 hrs. End Time: 1140 hrs. Materials:  Needle(s) Type: Regular needle Gauge: 22G Length: 3.5-in Medication(s): We administered fentaNYL, ropivacaine (PF) 2 mg/mL (0.2%), and lidocaine (PF). Please see chart orders for dosing details. 1 cc of 0.2% ropivacaine at each level Imaging Guidance (Spinal):  Type of Imaging Technique: Fluoroscopy Guidance  (Spinal) Indication(s): Assistance in needle guidance and placement for procedures requiring needle placement in or near specific anatomical locations not easily accessible without such assistance. Exposure Time: Please see nurses notes. Contrast: None used. Fluoroscopic Guidance: I was personally present during the use of fluoroscopy. "Tunnel Vision Technique" used to obtain the best possible view of the target area. Parallax error corrected before commencing the procedure. "Direction-depth-direction" technique used to introduce the needle under continuous pulsed fluoroscopy. Once target was reached, antero-posterior, oblique, and lateral fluoroscopic projection used confirm needle placement in all planes. Images permanently stored in EMR. Interpretation: No contrast injected. I personally interpreted the imaging intraoperatively. Adequate needle placement confirmed in multiple planes. Permanent images saved into the patient's record.  Antibiotic Prophylaxis:  Indication(s): None identified Antibiotic given: None  Post-operative Assessment:  EBL: None Complications: No immediate post-treatment complications observed by team, or reported by patient. Note: The patient tolerated the entire procedure well. A repeat set of vitals were taken after the procedure and the patient was kept under observation following institutional policy, for this type of procedure. Post-procedural neurological assessment was performed, showing return to baseline, prior to discharge. The patient was provided with post-procedure discharge instructions, including a section on how to identify potential problems. Should any problems arise concerning this procedure, the patient was given instructions to immediately contact us, at any time, without hesitation. In any case, we plan to contact the patient by telephone for a follow-up status report regarding this interventional procedure. Comments:  No additional relevant  information. 5 out of 5 strength bilateral upper extremity: Shoulder abduction, elbow flexion, elbow extension, thumb extension.  Plan of Care    Imaging Orders     DG C-Arm 1-60 Min-No Report Procedure Orders    No procedure(s) ordered today    Medications ordered for procedure: Meds ordered this encounter  Medications  . fentaNYL (SUBLIMAZE) injection 25-50 mcg    Make sure Narcan is available in the pyxis when using this medication. In the event of respiratory depression (RR< 8/min): Titrate NARCAN (naloxone) in increments of 0.1 to 0.2 mg IV at 2-3 minute intervals, until desired degree of reversal.  . ropivacaine (PF) 2 mg/mL (0.2%) (NAROPIN) injection 9 mL  . lidocaine (PF) (XYLOCAINE) 1 % injection 10 mL   Medications  administered: We administered fentaNYL, ropivacaine (PF) 2 mg/mL (0.2%), and lidocaine (PF).  See the medical record for exact dosing, route, and time of administration.  New Prescriptions   No medications on file   Disposition: Discharge home  Discharge Date & Time: 06/06/2017; 1209 hrs.   Physician-requested Follow-up: Return in about 2 weeks (around 06/20/2017) for Post Procedure Evaluation. Future Appointments Date Time Provider Steen  07/07/2017 8:45 AM Gillis Santa, MD [facility] None   Primary Care Physician: Theotis Burrow, MD Location: [facility] Note by: Gillis Santa, MD Date: 06/06/2017; Time: 1:59 PM  Disclaimer:  Medicine is not an exact science. The only guarantee in medicine is that nothing is guaranteed. It is important to note that the decision to proceed with this intervention was based on the information collected from the patient. The Data and conclusions were drawn from the patient's questionnaire, the interview, and the physical examination. Because the information was provided in large part by the patient, it cannot be guaranteed that it has not been purposely or unconsciously  manipulated. Every effort has been made to obtain as much relevant data as possible for this evaluation. It is important to note that the conclusions that lead to this procedure are derived in large part from the available data. Always take into account that the treatment will also be dependent on availability of resources and existing treatment guidelines, considered by other Pain Management Practitioners as being common knowledge and practice, at the time of the intervention. For Medico-Legal purposes, it is also important to point out that variation in procedural techniques and pharmacological choices are the acceptable norm. The indications, contraindications, technique, and results of the above procedure should only be interpreted and judged by a Board-Certified Interventional Pain Specialist with extensive familiarity and expertise in the same exact procedure and technique.

## 2017-06-06 NOTE — Patient Instructions (Addendum)
Facet Joint Block The facet joints connect the bones of the spine (vertebrae). They make it possible for you to bend, twist, and make other movements with your spine. They also keep you from bending too far, twisting too far, and making other excessive movements. A facet joint block is a procedure where a numbing medicine (anesthetic) is injected into a facet joint. Often, a type of anti-inflammatory medicine called a steroid is also injected. A facet joint block may be done to diagnose neck or back pain. If the pain gets better after a facet joint block, it means the pain is probably coming from the facet joint. If the pain does not get better, it means the pain is probably not coming from the facet joint. A facet joint block may also be done to relieve neck or back pain caused by an inflamed facet joint. A facet joint block is only done to relieve pain if the pain does not improve with other methods, such as medicine, exercise programs, and physical therapy. Tell a health care provider about:  Any allergies you have.  All medicines you are taking, including vitamins, herbs, eye drops, creams, and over-the-counter medicines.  Any problems you or family members have had with anesthetic medicines.  Any blood disorders you have.  Any surgeries you have had.  Any medical conditions you have.  Whether you are pregnant or may be pregnant. What are the risks? Generally, this is a safe procedure. However, problems may occur, including:  Bleeding.  Injury to a nerve near the injection site.  Pain at the injection site.  Weakness or numbness in areas controlled by nerves near the injection site.  Infection.  Temporary fluid retention.  Allergic reactions to medicines or dyes.  Injury to other structures or organs near the injection site.  What happens before the procedure?  Follow instructions from your health care provider about eating or drinking restrictions.  Ask your health care  provider about: ? Changing or stopping your regular medicines. This is especially important if you are taking diabetes medicines or blood thinners. ? Taking medicines such as aspirin and ibuprofen. These medicines can thin your blood. Do not take these medicines before your procedure if your health care provider instructs you not to.  Do not take any new dietary supplements or medicines without asking your health care provider first.  Plan to have someone take you home after the procedure. What happens during the procedure?  You may need to remove your clothing and dress in an open-back gown.  The procedure will be done while you are lying on an X-ray table. You will most likely be asked to lie on your stomach, but you may be asked to lie in a different position if an injection will be made in your neck.  Machines will be used to monitor your oxygen levels, heart rate, and blood pressure.  If an injection will be made in your neck, an IV tube will be inserted into one of your veins. Fluids and medicine will flow directly into your body through the IV tube.  The area over the facet joint where the injection will be made will be cleaned with soap. The surrounding skin will be covered with clean drapes.  A numbing medicine (local anesthetic) will be applied to your skin. Your skin may sting or burn for a moment.  A video X-ray machine (fluoroscopy) will be used to locate the joint. In some cases, a CT scan may be used.  A  contrast dye may be injected into the facet joint area to help locate the joint.  When the joint is located, an anesthetic will be injected into the joint through the needle.  Your health care provider will ask you whether you feel pain relief. If you do feel relief, a steroid may be injected to provide pain relief for a longer period of time. If you do not feel relief or feel only partial relief, additional injections of an anesthetic may be made in other facet  joints.  The needle will be removed.  Your skin will be cleaned.  A bandage (dressing) will be applied over each injection site. The procedure may vary among health care providers and hospitals. What happens after the procedure?  You will be observed for 15-30 minutes before being allowed to go home. This information is not intended to replace advice given to you by your health care provider. Make sure you discuss any questions you have with your health care provider. Document Released: 01/05/2007 Document Revised: 09/17/2015 Document Reviewed: 05/12/2015 Elsevier Interactive Patient Education  2018 [place] Facet Joint Block, Care After Refer to this sheet in the next few weeks. These instructions provide you with information about caring for yourself after your procedure. Your health care provider may also give you more specific instructions. Your treatment has been planned according to current medical practices, but problems sometimes occur. Call your health care provider if you have any problems or questions after your procedure. What can I expect after the procedure? After the procedure, it is common to have:  Some tenderness over the injection sites for 2 days after the procedure.  A temporary increase in blood sugar if you have diabetes.  Follow these instructions at home:  Keep track of the amount of pain relief you feel and how long it lasts.  Take over-the-counter and prescription medicines only as told by your health care provider. You may need to limit pain medicine within the first 4-6 hours after the procedure.  Remove your bandages (dressings) the morning after the procedure.  For the first 24 hours after the procedure: ? Do not apply heat near or over the injection sites. ? Do not take a bath or soak in water, such as in a pool or lake. ? Do not drive or operate heavy machinery unless approved by your health care provider. ? Avoid activities that require a lot  of energy.  If the injection site is tender, try applying ice to the area. To do this: ? Put ice in a plastic bag. ? Place a towel between your skin and the bag. ? Leave the ice on for 20 minutes, 2-3 times a day.  Keep all follow-up visits as told by your health care provider. This is important. Contact a health care provider if:  Fluid is coming from an injection site.  There is significant bleeding or swelling at an injection site.  You have diabetes and your blood sugar is above 180 mg/dL. Get help right away if:  You have a fever.  You have worsening pain or swelling around an injection site.  There are red streaks around an injection site.  You develop severe pain that is not controlled by your medicines.  You develop a headache, stiff neck, nausea, or vomiting.  Your eyes become very sensitive to light.  You have weakness, paralysis, or tingling in your arms or legs that was not present before the procedure.  You have difficulty urinating or breathing. This  information is not intended to replace advice given to you by your health care provider. Make sure you discuss any questions you have with your health care provider. Document Released: 08/02/2012 Document Revised: 12/31/2015 Document Reviewed: 05/12/2015 Elsevier Interactive Patient Education  2018 [employer]. Post-procedure Information What to expect: Most procedures involve the use of a local anesthetic (numbing medicine), and a steroid (anti-inflammatory medicine).  The local anesthetics may cause temporary numbness and weakness of the legs or arms, depending on the location of the block. This numbness/weakness may last 4-6 hours, depending on the local anesthetic used. In rare instances, it can last up to 24 hours. While numb, you must be very careful not to injure the extremity.  After any procedure, you could expect the pain to get better within 15-20 minutes. This relief is temporary and may last 4-6  hours. Once the local anesthetics wears off, you could experience discomfort, possibly more than usual, for up to 10 (ten) days. In the case of radiofrequencies, it may last up to 6 weeks. Surgeries may take up to 8 weeks for the healing process. The discomfort is due to the irritation caused by needles going through skin and muscle. To minimize the discomfort, we recommend using ice the first day, and heat from then on. The ice should be applied for 15 minutes on, and 15 minutes off. Keep repeating this cycle until bedtime. Avoid applying the ice directly to the skin, to prevent frostbite. Heat should be used daily, until the pain improves (4-10 days). Be careful not to burn yourself.  Occasionally you may experience muscle spasms or cramps. These occur as a consequence of the irritation caused by the needle sticks to the muscle and the blood that will inevitably be lost into the surrounding muscle tissue. Blood tends to be very irritating to tissues, which tend to react by going into spasm. These spasms may start the same day of your procedure, but they may also take days to develop. This late onset type of spasm or cramp is usually caused by electrolyte imbalances triggered by the steroids, at the level of the kidney. Cramps and spasms tend to respond well to muscle relaxants, multivitamins (some are triggered by the procedure, but may have their origins in vitamin deficiencies), and "Gatorade", or any sports drinks that can replenish any electrolyte imbalances. (If you are a diabetic, ask your pharmacist to get you a sugar-free brand.) Warm showers or baths may also be helpful. Stretching exercises are highly recommended. General Instructions:  Be alert for signs of possible infection: redness, swelling, heat, red streaks, elevated temperature, and/or fever. These typically appear 4 to 6 days after the procedure. Immediately notify your doctor if you experience unusual bleeding, difficulty breathing, or loss  of bowel or bladder control. If you experience increased pain, do not increase your pain medicine intake, unless instructed by your pain physician. Post-Procedure Care:  Be careful in moving about. Muscle spasms in the area of the injection may occur. Applying ice or heat to the area is often helpful. The incidence of spinal headaches after epidural injections ranges between 1.4% and 6%. If you develop a headache that does not seem to respond to conservative therapy, please let your physician know. This can be treated with an epidural blood patch.   Post-procedure numbness or redness is to be expected, however it should average 4 to 6 hours. If numbness and weakness of your extremities begins to develop 4 to 6 hours after your procedure, and is  felt to be progressing and worsening, immediately contact your physician.   Diet:  If you experience nausea, do not eat until this sensation goes away. If you had a "Stellate Ganglion Block" for upper extremity "Reflex Sympathetic Dystrophy", do not eat or drink until your hoarseness goes away. In any case, always start with liquids first and if you tolerate them well, then slowly progress to more solid foods. Activity:  For the first 4 to 6 hours after the procedure, use caution in moving about as you may experience numbness and/or weakness. Use caution in cooking, using household electrical appliances, and climbing steps. If you need to reach your Doctor call our office: [phone]) [phone] Monday-Thursday 8:00 am - 4:00 PM    Fridays: Closed     In case of an emergency: In case of emergency, call 911 or go to the nearest emergency room and have the physician there call us.  Interpretation of Procedure Every nerve block has two components: a diagnostic component, and a treatment component. Unrealistic expectations are the most common causes of "perceived failure".  In a perfect world, a single nerve block should be able to completely and permanently eliminate the  pain. Sadly, the world is not perfect.  Most pain management nerve blocks are performed using local anesthetics and steroids. Steroids are responsible for any long-term benefit that you may experience. Their purpose is to decrease any chronic swelling that may exist in the area. Steroids begin to work immediately after being injected. However, most patients will not experience any benefits until 5 to 10 days after the injection, when the swelling has come down to the point where they can tell a difference. Steroids will only help if there is swelling to be treated. As such, they can assist with the diagnosis. If effective, they suggest an inflammatory component to the pain, and if ineffective, they rule out inflammation as the main cause or component of the problem. If the problem is one of mechanical compression, you will get no benefit from those steroids.   In the case of local anesthetics, they have a crucial role in the diagnosis of your condition. Most will begin to work within15 to 20 minutes after injection. The duration will depend on the type used (short- vs. Long-acting). It is of outmost importance that patients keep tract of their pain, after the procedure. To assist with this matter, a "Post-procedure Pain Diary" is provided. Make sure to complete it and to bring it back to your follow-up appointment.  As long as the patient keeps accurate, detailed records of their symptoms after every procedure, and returns to have those interpreted, every procedure will provide [place] with invaluable information. Even a block that does not provide the patient with any relief, will always provide [place] with information about the mechanism and the origin of the pain. The only time a nerve block can be considered a waste of time is when patients do not keep track of the results, or do not keep their post-procedure appointment.  Reporting the results back to your physician The Pain Score  Pain is a subjective  complaint. It cannot be seen, touched, or measured. We depend entirely on the patient's report of the pain in order to assess your condition and treatment. To evaluate the pain, we use a pain scale, where "0" means "No Pain", and a "10" is "the worst possible pain that you can even imagine" (i.e. something like been eaten alive by a shark or being torn apart by  a lion).   You will frequently be asked to rate your pain. Please be as accurate, remember that medical decisions will be based on your responses. Please do not rate your pain above a 10. Doing so is actually interpreted as "symptom magnification" (exaggeration), as well as lack of understanding with regards to the scale. To put this into perspective, when you tell us that your pain is at a 10 (ten), what you are saying is that there is nothing we can do to make this pain any worse. (Carefully think about that.)

## 2017-06-06 NOTE — Progress Notes (Signed)
Safety precautions to be maintained throughout the outpatient stay will include: orient to surroundings, keep bed in low position, maintain call bell within reach at all times, provide assistance with transfer out of bed and ambulation.  

## 2017-06-23 ENCOUNTER — Ambulatory Visit: Payer: Medicare Other | Admitting: Student in an Organized Health Care Education/Training Program

## 2017-07-07 ENCOUNTER — Ambulatory Visit: Payer: Medicare Other | Admitting: Student in an Organized Health Care Education/Training Program

## 2017-07-28 ENCOUNTER — Other Ambulatory Visit: Payer: Self-pay | Admitting: Neurosurgery

## 2017-07-28 DIAGNOSIS — S32009K Unspecified fracture of unspecified lumbar vertebra, subsequent encounter for fracture with nonunion: Secondary | ICD-10-CM

## 2017-08-09 ENCOUNTER — Telehealth: Payer: Self-pay

## 2017-08-11 NOTE — Telephone Encounter (Signed)
Error

## 2017-08-24 ENCOUNTER — Other Ambulatory Visit: Payer: Self-pay

## 2017-08-24 ENCOUNTER — Encounter: Payer: Self-pay | Admitting: Student in an Organized Health Care Education/Training Program

## 2017-08-24 ENCOUNTER — Ambulatory Visit
Payer: Medicare Other | Attending: Student in an Organized Health Care Education/Training Program | Admitting: Student in an Organized Health Care Education/Training Program

## 2017-08-24 VITALS — BP 118/59 | HR 89 | Temp 98.0°F | Resp 18 | Ht 66.0 in | Wt 270.0 lb

## 2017-08-24 DIAGNOSIS — B20 Human immunodeficiency virus [HIV] disease: Secondary | ICD-10-CM | POA: Insufficient documentation

## 2017-08-24 DIAGNOSIS — F17210 Nicotine dependence, cigarettes, uncomplicated: Secondary | ICD-10-CM | POA: Diagnosis not present

## 2017-08-24 DIAGNOSIS — Z79899 Other long term (current) drug therapy: Secondary | ICD-10-CM | POA: Insufficient documentation

## 2017-08-24 DIAGNOSIS — I10 Essential (primary) hypertension: Secondary | ICD-10-CM | POA: Diagnosis not present

## 2017-08-24 DIAGNOSIS — M542 Cervicalgia: Secondary | ICD-10-CM

## 2017-08-24 DIAGNOSIS — F329 Major depressive disorder, single episode, unspecified: Secondary | ICD-10-CM | POA: Diagnosis not present

## 2017-08-24 DIAGNOSIS — J440 Chronic obstructive pulmonary disease with acute lower respiratory infection: Secondary | ICD-10-CM | POA: Diagnosis not present

## 2017-08-24 DIAGNOSIS — K219 Gastro-esophageal reflux disease without esophagitis: Secondary | ICD-10-CM | POA: Diagnosis not present

## 2017-08-24 DIAGNOSIS — Z6841 Body Mass Index (BMI) 40.0 and over, adult: Secondary | ICD-10-CM | POA: Insufficient documentation

## 2017-08-24 DIAGNOSIS — M4802 Spinal stenosis, cervical region: Secondary | ICD-10-CM | POA: Diagnosis not present

## 2017-08-24 DIAGNOSIS — M5136 Other intervertebral disc degeneration, lumbar region: Secondary | ICD-10-CM | POA: Diagnosis not present

## 2017-08-24 DIAGNOSIS — M4722 Other spondylosis with radiculopathy, cervical region: Secondary | ICD-10-CM | POA: Insufficient documentation

## 2017-08-24 DIAGNOSIS — M4712 Other spondylosis with myelopathy, cervical region: Secondary | ICD-10-CM | POA: Insufficient documentation

## 2017-08-24 DIAGNOSIS — M25511 Pain in right shoulder: Secondary | ICD-10-CM | POA: Diagnosis present

## 2017-08-24 NOTE — Progress Notes (Signed)
Patient's Name: Melanie Bowen  MRN: [id number]  Referring Provider: Theotis Burrow*  DOB: 11-09-1960  PCP: Theotis Burrow, MD  DOS: 08/24/2017  Note by: Gillis Santa, MD  Service setting: Ambulatory outpatient  Specialty: Interventional Pain Management  Location: [facility] (AMB) Pain Management Facility    Patient type: Established   Primary Reason(s) for Visit: Encounter for post-procedure evaluation of chronic illness with mild to moderate exacerbation CC: Neck Pain (right) and Shoulder Pain (right)  HPI  [name] is a 56 y.o. year old, female patient, who comes today for a post-procedure evaluation. She has Asthma, chronic; DDD (degenerative disc disease), lumbar; Degenerative disc disease, lumbar; Cough; Chronic cough; Lumbar pseudoarthrosis; GI bleed; GERD (gastroesophageal reflux disease); HTN (hypertension); Depression; HIV (human immunodeficiency virus infection) ([place]); Anemia; Symptomatic anemia; Lower GI bleed; Tobacco abuse counseling; Allergic rhinitis; Community acquired pneumonia; and Pneumonia on their problem list. Her primarily concern today is the Neck Pain (right) and Shoulder Pain (right)  Pain Assessment: Location: Right(right is worse) Neck Radiating: radiates down right arm Onset: More than a month ago Duration: Chronic pain Quality: Aching, Throbbing, Shooting Severity: 8 /10 (self-reported pain score)  Note: Reported level is inconsistent with clinical observations. Clinically the patient looks like a 3/10 A 3/10 is viewed as "Moderate" and described as significantly interfering with activities of daily living (ADL). It becomes difficult to feed, bathe, get dressed, get on and off the toilet or to perform personal hygiene functions. Difficult to get in and out of bed or a chair without assistance. Very distracting. With effort, it can be ignored when deeply involved in activities.       When using our objective Pain Scale, levels between 6 and 10/10 are  said to belong in an emergency room, as it progressively worsens from a 6/10, described as severely limiting, requiring emergency care not usually available at an outpatient pain management facility. At a 6/10 level, communication becomes difficult and requires great effort. Assistance to reach the emergency department may be required. Facial flushing and profuse sweating along with potentially dangerous increases in heart rate and blood pressure will be evident. Effect on ADL:   Timing: Constant Modifying factors: warm compresses  [name] comes in today for post-procedure evaluation after the treatment done on 06/06/2017.  Further details on both, my assessment(s), as well as the proposed treatment plan, please see below.  Post-Procedure Assessment  06/06/2017 Procedure: Right C4, C5, C6, C7 medial branch nerve block Pre-procedure pain score:  9/10 Post-procedure pain score: 0/10         Influential Factors: BMI: 43.58 kg/m Intra-procedural challenges: None observed.         Assessment challenges: None detected.              Reported side-effects: None.        Post-procedural adverse reactions or complications: None reported         Sedation: Please see nurses note. When no sedatives are used, the analgesic levels obtained are directly associated to the effectiveness of the local anesthetics. However, when sedation is provided, the level of analgesia obtained during the initial 1 hour following the intervention, is believed to be the result of a combination of factors. These factors may include, but are not limited to: 1. The effectiveness of the local anesthetics used. 2. The effects of the analgesic(s) and/or anxiolytic(s) used. 3. The degree of discomfort experienced by the patient at the time of the procedure. 4. The patients ability and reliability  in recalling and recording the events. 5. The presence and influence of possible secondary gains and/or psychosocial factors. Reported  result: Relief experienced during the 1st hour after the procedure: 100 % (Ultra-Short Term Relief)            Interpretative annotation: Clinically appropriate result. Analgesia during this period is likely to be Local Anesthetic and/or IV Sedative (Analgesic/Anxiolytic) related.          Effects of local anesthetic: The analgesic effects attained during this period are directly associated to the localized infiltration of local anesthetics and therefore cary significant diagnostic value as to the etiological location, or anatomical origin, of the pain. Expected duration of relief is directly dependent on the pharmacodynamics of the local anesthetic used. Long-acting (4-6 hours) anesthetics used.  Reported result: Relief during the next 4 to 6 hour after the procedure: 100 % (Short-Term Relief)            Interpretative annotation: Clinically appropriate result. Analgesia during this period is likely to be Local Anesthetic-related.          Long-term benefit: Defined as the period of time past the expected duration of local anesthetics (1 hour for short-acting and 4-6 hours for long-acting). With the possible exception of prolonged sympathetic blockade from the local anesthetics, benefits during this period are typically attributed to, or associated with, other factors such as analgesic sensory neuropraxia, antiinflammatory effects, or beneficial biochemical changes provided by agents other than the local anesthetics.  Reported result: Extended relief following procedure: 100 %(couple of weeks) (Long-Term Relief)            Interpretative annotation: Clinically appropriate result. Good relief. No long-term benefit attained.             Current benefits: Defined as reported results that persistent at this point in time.   Analgesia: 25-50 %            Function: Back to baseline ROM: Back to baseline Interpretative annotation: Recurrence of symptoms. Therapeutic benefit observed. Effective diagnostic  intervention.          Interpretation: Results would suggest a successful diagnostic and therapeutic intervention.                  Plan:  Consider diagnostic procedure No.: 3  Laboratory Chemistry  Inflammation Markers (CRP: Acute Phase) (ESR: Chronic Phase) No results found for: CRP, ESRSEDRATE, LATICACIDVEN               Rheumatology Markers Lab Results  Component Value Date   ANA Negative 11/29/2016                Renal Function Markers Lab Results  Component Value Date   BUN 17 03/14/2017   CREATININE 1.06 (H) 03/14/2017   GFRAA >60 03/14/2017   GFRNONAA 58 (L) 03/14/2017                 Hepatic Function Markers Lab Results  Component Value Date   AST 25 03/14/2017   ALT 15 03/14/2017   ALBUMIN 3.4 (L) 03/14/2017   ALKPHOS 97 03/14/2017   LIPASE 31 08/01/2016                 Electrolytes Lab Results  Component Value Date   NA 137 03/14/2017   K 3.2 (L) 03/14/2017   CL 106 03/14/2017   CALCIUM 8.9 03/14/2017                 Neuropathy Markers Lab Results  Component Value Date   HIV Reactive (A) 11/29/2016                 Bone Pathology Markers No results found for: VD25OH, [id number], [id number], [id number], 25OHVITD1, 25OHVITD2, 25OHVITD3, TESTOFREE, TESTOSTERONE               Coagulation Parameters Lab Results  Component Value Date   PLT 426 03/14/2017                 Cardiovascular Markers Lab Results  Component Value Date   BNP 193.0 (H) 12/03/2016   CKTOTAL 241 (H) 04/15/2013   CKMB 1.3 04/15/2013   TROPONINI <0.03 03/14/2017   HGB 10.7 (L) 03/14/2017   HCT 33.1 (L) 03/14/2017                 CA Markers No results found for: CEA, CA125, LABCA2               Note: Lab results reviewed.  Recent Diagnostic Imaging Results  DG C-Arm 1-60 Min-No Report Fluoroscopy was utilized by the requesting physician.  No radiographic  interpretation.   Complexity Note: Imaging results reviewed. Results shared with Ms. Headen, using Layman's  terms.                         Meds   Current Outpatient Medications:  .  albuterol (PROVENTIL HFA;VENTOLIN HFA) 108 (90 BASE) MCG/ACT inhaler, Inhale 2 puffs into the lungs every 6 (six) hours as needed for wheezing or shortness of breath. , Disp: , Rfl:  .  amLODipine (NORVASC) 10 MG tablet, Take 10 mg by mouth daily. , Disp: , Rfl:  .  DEXILANT 60 MG capsule, Take 60 mg by mouth daily. , Disp: , Rfl:  .  Fluticasone-Salmeterol (ADVAIR DISKUS) 500-50 MCG/DOSE AEPB, Inhale 1 puff into the lungs 2 (two) times daily., Disp: 60 each, Rfl: 5 .  GENVOYA 150-150-200-10 MG TABS tablet, Take 1 tablet by mouth daily., Disp: , Rfl:  .  hydrochlorothiazide (HYDRODIURIL) 25 MG tablet, Take 25 mg by mouth daily., Disp: , Rfl:  .  ipratropium-albuterol (DUONEB) 0.5-2.5 (3) MG/3ML SOLN, Inhale 3 mLs into the lungs every 6 (six) hours as needed (for wheezing/shortness of breath). , Disp: , Rfl:  .  losartan (COZAAR) 50 MG tablet, Take 50 mg by mouth daily., Disp: , Rfl:  .  omeprazole (PRILOSEC) 40 MG capsule, Take 40 mg by mouth daily. , Disp: , Rfl:  .  oxyCODONE (ROXICODONE) 15 MG immediate release tablet, Take 15 mg by mouth every 6 (six) hours as needed for pain., Disp: , Rfl:  .  promethazine (PHENERGAN) 25 MG tablet, Take 25 mg by mouth every 6 (six) hours as needed for nausea or vomiting. , Disp: , Rfl:  .  valACYclovir (VALTREX) 500 MG tablet, Take 500 mg by mouth 2 (two) times daily., Disp: , Rfl:  .  amoxicillin-clavulanate (AUGMENTIN) 875-125 MG tablet, Take 1 tablet by mouth 2 (two) times daily. (Patient not taking: Reported on 04/07/2017), Disp: 10 tablet, Rfl: 0 .  benzonatate (TESSALON PERLES) 100 MG capsule, Take 1 capsule (100 mg total) by mouth every 6 (six) hours as needed for cough. (Patient not taking: Reported on 04/11/2017), Disp: 30 capsule, Rfl: 0 .  chlorpheniramine-HYDROcodone (TUSSIONEX PENNKINETIC ER) 10-8 MG/5ML SUER, Take 5 mLs by mouth 2 (two) times daily. (Patient not taking:  Reported on 04/07/2017), Disp: 140 mL, Rfl: 0 .  dicyclomine (BENTYL) 10 MG  capsule, Take 1 capsule (10 mg total) by mouth 3 (three) times daily as needed (Abdominal cramping). (Patient not taking: Reported on 11/28/2016), Disp: 10 capsule, Rfl: 0 .  efavirenz-emtricitabine-tenofovir (ATRIPLA) 600-200-300 MG per tablet, Take 1 tablet by mouth at bedtime., Disp: , Rfl:  .  guaiFENesin-codeine 100-10 MG/5ML syrup, Take 5 mLs by mouth every 6 (six) hours as needed for cough. (Patient not taking: Reported on 04/07/2017), Disp: 120 mL, Rfl: 0 .  magic mouthwash SOLN, Take 5 mLs by mouth 4 (four) times daily as needed for mouth pain. (Patient not taking: Reported on 08/24/2017), Disp: 15 mL, Rfl: 0 .  meloxicam (MOBIC) 15 MG tablet, Take 1 tablet (15 mg total) by mouth daily. (Patient not taking: Reported on 04/07/2017), Disp: 30 tablet, Rfl: 0 .  methocarbamol (ROBAXIN-750) 750 MG tablet, Take 1 tablet (750 mg total) by mouth 4 (four) times daily. (Patient not taking: Reported on 05/24/2017), Disp: 20 tablet, Rfl: 0 .  naproxen (NAPROSYN) 500 MG tablet, Take 1 tablet (500 mg total) by mouth 2 (two) times daily with a meal. (Patient not taking: Reported on 05/24/2017), Disp: 10 tablet, Rfl: 00 .  polyethylene glycol (MIRALAX / GLYCOLAX) packet, Take 17 g by mouth daily. (Patient not taking: Reported on 04/07/2017), Disp: 14 each, Rfl: 0 .  predniSONE (DELTASONE) 20 MG tablet, Take 2 tablets (40 mg total) by mouth daily. (Patient not taking: Reported on 04/07/2017), Disp: 8 tablet, Rfl: 0 .  predniSONE (STERAPRED UNI-PAK 21 TAB) 10 MG (21) TBPK tablet, Take by mouth daily. Dispense steroid taper pack as instructed (Patient not taking: Reported on 04/07/2017), Disp: 21 tablet, Rfl: 0 .  topiramate (TOPAMAX) 25 MG capsule, Take 1 capsule (25 mg total) by mouth 2 (two) times daily. (Patient not taking: Reported on 08/24/2017), Disp: 60 capsule, Rfl: 1 .  traZODone (DESYREL) 100 MG tablet, , Disp: , Rfl:   ROS  Constitutional:  Denies any fever or chills Gastrointestinal: No reported hemesis, hematochezia, vomiting, or acute GI distress Musculoskeletal: Denies any acute onset joint swelling, redness, loss of ROM, or weakness Neurological: No reported episodes of acute onset apraxia, aphasia, dysarthria, agnosia, amnesia, paralysis, loss of coordination, or loss of consciousness  Allergies  Ms. Fomby is allergic to morphine and related; gabapentin; and zanaflex  [tizanidine].  PFSH  Drug: Ms. Eid  reports that she does not use drugs. Alcohol:  reports that she drinks about 3.0 oz of alcohol per week. Tobacco:  reports that she has been smoking cigarettes.  She has smoked for the past 38.00 years. she has never used smokeless tobacco. Medical:  has a past medical history of Allergic rhinitis, Arthritis, Asthma, Blepharitis, Bronchitis, Cigarette smoker, Claustrophobia, COPD (chronic obstructive pulmonary disease) ([place]), Cough, Depression, Eye irritation, Genital herpes, GERD (gastroesophageal reflux disease), Headache, HIV (human immunodeficiency virus infection) ([place]), Hypertension, Paresthesia of hand, bilateral, and Shortness of breath dyspnea. Surgical: Ms. Behrman  has a past surgical history that includes Knee surgery (Bilateral, [phone]); Hand surgery (Bilateral); Tubal ligation; Laparoscopy for ectopic pregnancy; Bunionectomy (Bilateral); Finger fracture (Right); back surgery may 2016; Video bronchoscopy (N/A, 06/30/2015); Diagnostic laparoscopy; Back surgery; Joint replacement (Bilateral); Cardiac catheterization (N/A, 01/12/2016); Cardiac catheterization (N/A, 01/12/2016); and Esophagogastroduodenoscopy (Left, 01/11/2016). Family: family history includes Breast cancer in her sister.  Constitutional Exam  General appearance: Well nourished, well developed, and well hydrated. In no apparent acute distress Vitals:   08/24/17 1049  BP: (!) 118/59  Pulse: 89  Resp: 18  Temp: 98 F (36.7 C)  SpO2:  99%   Weight: 270 lb (122.5 kg)  Height: _0  (1.676 m)   BMI Assessment: Estimated body mass index is 43.58 kg/m as calculated from the following:   Height as of this encounter: _1  (1.676 m).   Weight as of this encounter: 270 lb (122.5 kg).  BMI interpretation table: BMI level Category Range association with higher incidence of chronic pain  <18 kg/m2 Underweight   18.5-24.9 kg/m2 Ideal body weight   25-29.9 kg/m2 Overweight Increased incidence by 20%  30-34.9 kg/m2 Obese (Class I) Increased incidence by 68%  35-39.9 kg/m2 Severe obesity (Class II) Increased incidence by 136%  >40 kg/m2 Extreme obesity (Class III) Increased incidence by 254%   BMI Readings from Last 4 Encounters:  08/24/17 43.58 kg/m  06/06/17 48.42 kg/m  04/26/17 48.91 kg/m  04/11/17 48.74 kg/m   Wt Readings from Last 4 Encounters:  08/24/17 270 lb (122.5 kg)  06/06/17 300 lb (136.1 kg)  04/26/17 (!) 303 lb (137.4 kg)  04/11/17 (!) 302 lb (137 kg)  Psych/Mental status: Alert, oriented x 3 (person, place, & time)       Eyes: PERLA Respiratory: No evidence of acute respiratory distress  Cervical Spine Area Exam  Skin & Axial Inspection:No masses, redness, edema, swelling, or associated skin lesions Alignment:Symmetrical Functional HBZ:JIRCVELF after treatment Stability:No instability detected Muscle Tone/Strength:Functionally intact. No obvious neuro-muscular anomalies detected. Sensory (Neurological):Movement-associated painimproved Palpation:Complains of area being tender to palpationoverlying right cervical facets and periscapular region  Positive Spurling's on right       Upper Extremity (UE) Exam    Side:Right upper extremity  Side:Left upper extremity   Skin & Extremity Inspection:Skin color, temperature, and hair growth are WNL. No peripheral edema or cyanosis. No masses, redness, swelling, asymmetry, or associated skin lesions. No contractures.  Skin &  Extremity Inspection:Skin color, temperature, and hair growth are WNL. No peripheral edema or cyanosis. No masses, redness, swelling, asymmetry, or associated skin lesions. No contractures.   Functional YBO:FBPZ restricted ROM  Functional WCH:ENIDPOEUMPNT ROM   Muscle Tone/Strength:Functionally intact. No obvious neuro-muscular anomalies detected.  Muscle Tone/Strength:Functionally intact. No obvious neuro-muscular anomalies detected.   Sensory (Neurological):Movement-associated pain  Sensory (Neurological):Unimpaired   Palpation:Complains of area being tender to palpation  Palpation:No palpable anomalies   Specialized Test(s):Deferred  Specialized Test(s):Deferred   4 out of 5 strength right upper extremity: C5 shoulder abduction, C6 elbow flexion, C7 elbow extension, C8 thumb extension primarily limited by pain.  Thoracic Spine Area Exam  Skin & Axial Inspection: No masses, redness, or swelling Alignment: Symmetrical Functional ROM: Unrestricted ROM Stability: No instability detected Muscle Tone/Strength: Functionally intact. No obvious neuro-muscular anomalies detected. Sensory (Neurological): Unimpaired Muscle strength & Tone: No palpable anomalies  Lumbar Spine Area Exam  Skin & Axial Inspection: No masses, redness, or swelling Alignment: Symmetrical Functional ROM: Unrestricted ROM      Stability: No instability detected Muscle Tone/Strength: Functionally intact. No obvious neuro-muscular anomalies detected. Sensory (Neurological): Unimpaired Palpation: No palpable anomalies       Provocative Tests: Lumbar Hyperextension and rotation test: evaluation deferred today       Lumbar Lateral bending test: evaluation deferred today       Patrick's Maneuver: evaluation deferred today                    Gait & Posture Assessment  Ambulation: Unassisted Gait: Relatively normal for age and body  habitus Posture: WNL   Lower Extremity Exam    Side: Right lower extremity  Side:  Left lower extremity  Skin & Extremity Inspection: Skin color, temperature, and hair growth are WNL. No peripheral edema or cyanosis. No masses, redness, swelling, asymmetry, or associated skin lesions. No contractures.  Skin & Extremity Inspection: Skin color, temperature, and hair growth are WNL. No peripheral edema or cyanosis. No masses, redness, swelling, asymmetry, or associated skin lesions. No contractures.  Functional ROM: Unrestricted ROM          Functional ROM: Unrestricted ROM          Muscle Tone/Strength: Functionally intact. No obvious neuro-muscular anomalies detected.  Muscle Tone/Strength: Functionally intact. No obvious neuro-muscular anomalies detected.  Sensory (Neurological): Unimpaired  Sensory (Neurological): Unimpaired  Palpation: No palpable anomalies  Palpation: No palpable anomalies   Assessment  Primary Diagnosis & Pertinent Problem List: The primary encounter diagnosis was Spondylosis, cervical, with myelopathy. Diagnoses of Cervicalgia and Osteoarthritis of spine with radiculopathy, cervical region were also pertinent to this visit.  Status Diagnosis  Having a Flare-up Having a Flare-up Persistent 1. Spondylosis, cervical, with myelopathy   2. Cervicalgia   3. Osteoarthritis of spine with radiculopathy, cervical region       56 year old female with a past medical history of morbid obesity, lumbar degenerative disc disease, lumbar pseudoarthrosis, GERD, hypertension, HIV status, depression with neck pain that started in approximately July with radiation down to the patient's right arm. Patient endorses occasional numbness and tingling in her right fingers and occasional weakness at times.   Patient did have a cervical MRI performed on 03/25/2017 which showed C3-C4 mild broad-based disc bulge, severe left facet arthropathy, mild right foraminal stenosis at C4-C5, broad-based disc  bulge at C5-C6 with severe right and mild left foraminal stenosis.  Patient is status post right C4, C5, C6, C7 medial branch nerve block on June 06, 2017.  She returns for follow-up.  She states that the nerve block was very effective for the first couple of weeks but now notes return of pain.  She notes 100% pain relief during those first 2-3 weeks with improvement in range of motion as well.  We discussed repeating the block with steroid.  Patient would like to proceed.  If duration of pain relief is not extended with steroid, I discussed with patient the risks and benefits of cervical radiofrequency ablation.  I also had a discussion with the patient about continuing right neck physical therapy exercises, right arm movement to help out with her cervicalgia symptoms.  Plan: -Repeat right C4, C5, C6, C7 medial branch nerve block with steroid. -Can consider radiofrequency ablation at this level in the future if steroid does not extend duration of blockade.   Time Note: Greater than 50% of the 25 minute(s) of face-to-face time spent with Ms. Pinette, was spent in counseling/coordination of care regarding: the appropriate use of the pain scale, the treatment plan, treatment alternatives, the risks and possible complications of proposed treatment, going over the informed consent, the goals of pain management (increased in functionality) and the need to bring and keep the BMI below 30.   Lab-work, procedure(s), and/or referral(s): Orders Placed This Encounter  Procedures  . CERVICAL FACET (MEDIAL BRANCH NERVE BLOCK)     Provider-requested follow-up: Return for Procedure.  Future Appointments  Date Time Provider [place]  09/05/2017 11:30 AM [facility] 1 [facility]  09/12/2017 10:00 AM Gillis Santa, MD [facility] None    Primary Care Physician: Theotis Burrow, MD Location: [facility] Note by: Gillis Santa, M.D Date: 08/24/2017; Time:  2:06 PM  Patient Instructions   Right C4-C7 MBNB with sedation with steroid in 2-3 weeks  Facet Blocks Patient Information  Description: The facets are joints in the spine between the vertebrae.  Like any joints in the body, facets can become irritated and painful.  Arthritis can also effect the facets.  By injecting steroids and local anesthetic in and around these joints, we can temporarily block the nerve supply to them.  Steroids act directly on irritated nerves and tissues to reduce selling and inflammation which often leads to decreased pain.  Facet blocks may be done anywhere along the spine from the neck to the low back depending upon the location of your pain.   After numbing the skin with local anesthetic (like Novocaine), a small needle is passed onto the facet joints under x-ray guidance.  You may experience a sensation of pressure while this is being done.  The entire block usually lasts about 15-25 minutes.   Conditions which may be treated by facet blocks:   Low back/buttock pain  Neck/shoulder pain  Certain types of headaches  Preparation for the injection:  1. Do not eat any solid food or dairy products within 8 hours of your appointment. 2. You may drink clear liquid up to 3 hours before appointment.  Clear liquids include water, black coffee, juice or soda.  No milk or cream please. 3. You may take your regular medication, including pain medications, with a sip of water before your appointment.  Diabetics should hold regular insulin (if taken separately) and take 1/2 normal NPH dose the morning of the procedure.  Carry some sugar containing items with you to your appointment. 4. A driver must accompany you and be prepared to drive you home after your procedure. 5. Bring all your current medications with you. 6. An IV may be inserted and sedation may be given at the discretion of the physician. 7. A blood pressure cuff, EKG and other monitors will often be applied  during the procedure.  Some patients may need to have extra oxygen administered for a short period. 8. You will be asked to provide medical information, including your allergies and medications, prior to the procedure.  We must know immediately if you are taking blood thinners (like Coumadin/Warfarin) or if you are allergic to IV iodine contrast (dye).  We must know if you could possible be pregnant.  Possible side-effects:   Bleeding from needle site  Infection (rare, may require surgery)  Nerve injury (rare)  Numbness & tingling (temporary)  Difficulty urinating (rare, temporary)  Spinal headache (a headache worse with upright posture)  Light-headedness (temporary)  Pain at injection site (serveral days)  Decreased blood pressure (rare, temporary)  Weakness in arm/leg (temporary)  Pressure sensation in back/neck (temporary)   Call if you experience:   Fever/chills associated with headache or increased back/neck pain  Headache worsened by an upright position  New onset, weakness or numbness of an extremity below the injection site  Hives or difficulty breathing (go to the emergency room)  Inflammation or drainage at the injection site(s)  Severe back/neck pain greater than usual  New symptoms which are concerning to you  Please note:  Although the local anesthetic injected can often make your back or neck feel good for several hours after the injection, the pain will likely return. It takes 3-7 days for steroids to work.  You may not notice any pain relief for at least one week.  If effective, we will often do  a series of 2-3 injections spaced 3-6 weeks apart to maximally decrease your pain.  After the initial series, you may be a candidate for a more permanent nerve block of the facets.  If you have any questions, please call #[phone] [place]  What are the risk, side effects and  possible complications? Generally speaking, most procedures are safe.  However, with any procedure there are risks, side effects, and the possibility of complications.  The risks and complications are dependent upon the sites that are lesioned, or the type of nerve block to be performed.  The closer the procedure is to the spine, the more serious the risks are.  Great care is taken when placing the radio frequency needles, block needles or lesioning probes, but sometimes complications can occur. 1. Infection: Any time there is an injection through the skin, there is a risk of infection.  This is why sterile conditions are used for these blocks.  There are four possible types of infection. 1. Localized skin infection. 2. Central Nervous System Infection-This can be in the form of Meningitis, which can be deadly. 3. Epidural Infections-This can be in the form of an epidural abscess, which can cause pressure inside of the spine, causing compression of the spinal cord with subsequent paralysis. This would require an emergency surgery to decompress, and there are no guarantees that the patient would recover from the paralysis. 4. Discitis-This is an infection of the intervertebral discs.  It occurs in about 1% of discography procedures.  It is difficult to treat and it may lead to surgery.        2. Pain: the needles have to go through skin and soft tissues, will cause soreness.       3. Damage to internal structures:  The nerves to be lesioned may be near blood vessels or    other nerves which can be potentially damaged.       4. Bleeding: Bleeding is more common if the patient is taking blood thinners such as  aspirin, Coumadin, Ticiid, Plavix, etc., or if he/she have some genetic predisposition  such as hemophilia. Bleeding into the spinal canal can cause compression of the spinal  cord with subsequent paralysis.  This would require an emergency surgery to  decompress and there are no guarantees that the  patient would recover from the  paralysis.       5. Pneumothorax:  Puncturing of a lung is a possibility, every time a needle is introduced in  the area of the chest or upper back.  Pneumothorax refers to free air around the  collapsed lung(s), inside of the thoracic cavity (chest cavity).  Another two possible  complications related to a similar event would include: Hemothorax and Chylothorax.   These are variations of the Pneumothorax, where instead of air around the collapsed  lung(s), you may have blood or chyle, respectively.       6. Spinal headaches: They may occur with any procedures in the area of the spine.       7. Persistent CSF (Cerebro-Spinal Fluid) leakage: This is a rare problem, but may occur  with prolonged intrathecal or epidural catheters either due to the formation of a fistulous  track or a dural tear.       8. Nerve damage: By working so close to the spinal cord, there is always a possibility of  nerve damage, which could be as serious as a permanent  spinal cord injury with  paralysis.       9. Death:  Although rare, severe deadly allergic reactions known as "Anaphylactic  reaction" can occur to any of the medications used.      10. Worsening of the symptoms:  We can always make thing worse.  What are the chances of something like this happening? Chances of any of this occuring are extremely low.  By statistics, you have more of a chance of getting killed in a motor vehicle accident: while driving to the hospital than any of the above occurring .  Nevertheless, you should be aware that they are possibilities.  In general, it is similar to taking a shower.  Everybody knows that you can slip, hit your head and get killed.  Does that mean that you should not shower again?  Nevertheless always keep in mind that statistics do not mean anything if you happen to be on the wrong side of them.  Even if a procedure has a 1 (one) in a 1,000,000 (million) chance of going wrong, it you happen to  be that one..Also, keep in mind that by statistics, you have more of a chance of having something go wrong when taking medications.  Who should not have this procedure? If you are on a blood thinning medication (e.g. Coumadin, Plavix, see list of "Blood Thinners"), or if you have an active infection going on, you should not have the procedure.  If you are taking any blood thinners, please inform your physician.  How should I prepare for this procedure?  Do not eat or drink anything at least six hours prior to the procedure.  Bring a driver with you .  It cannot be a taxi.  Come accompanied by an adult that can drive you back, and that is strong enough to help you if your legs get weak or numb from the local anesthetic.  Take all of your medicines the morning of the procedure with just enough water to swallow them.  If you have diabetes, make sure that you are scheduled to have your procedure done first thing in the morning, whenever possible.  If you have diabetes, take only half of your insulin dose and notify our nurse that you have done so as soon as you arrive at the clinic.  If you are diabetic, but only take blood sugar pills (oral hypoglycemic), then do not take them on the morning of your procedure.  You may take them after you have had the procedure.  Do not take aspirin or any aspirin-containing medications, at least eleven (11) days prior to the procedure.  They may prolong bleeding.  Wear loose fitting clothing that may be easy to take off and that you would not mind if it got stained with Betadine or blood.  Do not wear any jewelry or perfume  Remove any nail coloring.  It will interfere with some of our monitoring equipment.  NOTE: Remember that this is not meant to be interpreted as a complete list of all possible complications.  Unforeseen problems may occur.  BLOOD THINNERS The following drugs contain aspirin or other products, which can cause increased bleeding  during surgery and should not be taken for 2 weeks prior to and 1 week after surgery.  If you should need take something for relief of minor pain, you may take acetaminophen which is found in Tylenol,m Datril, Anacin-3 and Panadol. It is not blood thinner. The products listed below are.  Do not take  any of the products listed below in addition to any listed on your instruction sheet.  A.P.C or A.P.C with Codeine Codeine Phosphate Capsules #3 Ibuprofen Ridaura  ABC compound Congesprin Imuran rimadil  Advil Cope Indocin Robaxisal  Alka-Seltzer Effervescent Pain Reliever and Antacid Coricidin or Coricidin-D  Indomethacin Rufen  Alka-Seltzer plus Cold Medicine Cosprin Ketoprofen S-A-C Tablets  Anacin Analgesic Tablets or Capsules Coumadin Korlgesic Salflex  Anacin Extra Strength Analgesic tablets or capsules CP-2 Tablets Lanoril Salicylate  Anaprox Cuprimine Capsules Levenox Salocol  Anexsia-D Dalteparin Magan Salsalate  Anodynos Darvon compound Magnesium Salicylate Sine-off  Ansaid Dasin Capsules Magsal Sodium Salicylate  Anturane Depen Capsules Marnal Soma  APF Arthritis pain formula Dewitt's Pills Measurin Stanback  Argesic Dia-Gesic Meclofenamic Sulfinpyrazone  Arthritis Bayer Timed Release Aspirin Diclofenac Meclomen Sulindac  Arthritis pain formula Anacin Dicumarol Medipren Supac  Analgesic (Safety coated) Arthralgen Diffunasal Mefanamic Suprofen  Arthritis Strength Bufferin Dihydrocodeine Mepro Compound Suprol  Arthropan liquid Dopirydamole Methcarbomol with Aspirin Synalgos  ASA tablets/Enseals Disalcid Micrainin Tagament  Ascriptin Doan's Midol Talwin  Ascriptin A/D Dolene Mobidin Tanderil  Ascriptin Extra Strength Dolobid Moblgesic Ticlid  Ascriptin with Codeine Doloprin or Doloprin with Codeine Momentum Tolectin  Asperbuf Duoprin Mono-gesic Trendar  Aspergum Duradyne Motrin or Motrin IB Triminicin  Aspirin plain, buffered or enteric coated Durasal Myochrisine Trigesic  Aspirin  Suppositories Easprin Nalfon Trillsate  Aspirin with Codeine Ecotrin Regular or Extra Strength Naprosyn Uracel  Atromid-S Efficin Naproxen Ursinus  Auranofin Capsules Elmiron Neocylate Vanquish  Axotal Emagrin Norgesic Verin  Azathioprine Empirin or Empirin with Codeine Normiflo Vitamin E  Azolid Emprazil Nuprin Voltaren  Bayer Aspirin plain, buffered or children's or timed BC Tablets or powders Encaprin Orgaran Warfarin Sodium  Buff-a-Comp Enoxaparin Orudis Zorpin  Buff-a-Comp with Codeine Equegesic Os-Cal-Gesic   Buffaprin Excedrin plain, buffered or Extra Strength Oxalid   Bufferin Arthritis Strength Feldene Oxphenbutazone   Bufferin plain or Extra Strength Feldene Capsules Oxycodone with Aspirin   Bufferin with Codeine Fenoprofen Fenoprofen Pabalate or Pabalate-SF   Buffets II Flogesic Panagesic   Buffinol plain or Extra Strength Florinal or Florinal with Codeine Panwarfarin   Buf-Tabs Flurbiprofen Penicillamine   Butalbital Compound Four-way cold tablets Penicillin   Butazolidin Fragmin Pepto-Bismol   Carbenicillin Geminisyn Percodan   Carna Arthritis Reliever Geopen Persantine   Carprofen Gold's salt Persistin   Chloramphenicol Goody's Phenylbutazone   Chloromycetin Haltrain Piroxlcam   Clmetidine heparin Plaquenil   Cllnoril Hyco-pap Ponstel   Clofibrate Hydroxy chloroquine Propoxyphen         Before stopping any of these medications, be sure to consult the physician who ordered them.  Some, such as Coumadin (Warfarin) are ordered to prevent or treat serious conditions such as "deep thrombosis", "pumonary embolisms", and other heart problems.  The amount of time that you may need off of the medication may also vary with the medication and the reason for which you were taking it.  If you are taking any of these medications, please make sure you notify your pain physician before you undergo any procedures.

## 2017-08-24 NOTE — Patient Instructions (Addendum)
Right C4-C7 MBNB with sedation with steroid in 2-3 weeks  Facet Blocks Patient Information  Description: The facets are joints in the spine between the vertebrae.  Like any joints in the body, facets can become irritated and painful.  Arthritis can also effect the facets.  By injecting steroids and local anesthetic in and around these joints, we can temporarily block the nerve supply to them.  Steroids act directly on irritated nerves and tissues to reduce selling and inflammation which often leads to decreased pain.  Facet blocks may be done anywhere along the spine from the neck to the low back depending upon the location of your pain.   After numbing the skin with local anesthetic (like Novocaine), a small needle is passed onto the facet joints under x-ray guidance.  You may experience a sensation of pressure while this is being done.  The entire block usually lasts about 15-25 minutes.   Conditions which may be treated by facet blocks:   Low back/buttock pain  Neck/shoulder pain  Certain types of headaches  Preparation for the injection:  1. Do not eat any solid food or dairy products within 8 hours of your appointment. 2. You may drink clear liquid up to 3 hours before appointment.  Clear liquids include water, black coffee, juice or soda.  No milk or cream please. 3. You may take your regular medication, including pain medications, with a sip of water before your appointment.  Diabetics should hold regular insulin (if taken separately) and take 1/2 normal NPH dose the morning of the procedure.  Carry some sugar containing items with you to your appointment. 4. A driver must accompany you and be prepared to drive you home after your procedure. 5. Bring all your current medications with you. 6. An IV may be inserted and sedation may be given at the discretion of the physician. 7. A blood pressure cuff, EKG and other monitors will often be applied during the procedure.  Some patients may  need to have extra oxygen administered for a short period. 8. You will be asked to provide medical information, including your allergies and medications, prior to the procedure.  We must know immediately if you are taking blood thinners (like Coumadin/Warfarin) or if you are allergic to IV iodine contrast (dye).  We must know if you could possible be pregnant.  Possible side-effects:   Bleeding from needle site  Infection (rare, may require surgery)  Nerve injury (rare)  Numbness & tingling (temporary)  Difficulty urinating (rare, temporary)  Spinal headache (a headache worse with upright posture)  Light-headedness (temporary)  Pain at injection site (serveral days)  Decreased blood pressure (rare, temporary)  Weakness in arm/leg (temporary)  Pressure sensation in back/neck (temporary)   Call if you experience:   Fever/chills associated with headache or increased back/neck pain  Headache worsened by an upright position  New onset, weakness or numbness of an extremity below the injection site  Hives or difficulty breathing (go to the emergency room)  Inflammation or drainage at the injection site(s)  Severe back/neck pain greater than usual  New symptoms which are concerning to you  Please note:  Although the local anesthetic injected can often make your back or neck feel good for several hours after the injection, the pain will likely return. It takes 3-7 days for steroids to work.  You may not notice any pain relief for at least one week.  If effective, we will often do a series of 2-3 injections spaced 3-6  weeks apart to maximally decrease your pain.  After the initial series, you may be a candidate for a more permanent nerve block of the facets.  If you have any questions, please call #[phone] [place]  What are the risk, side effects and possible complications? Generally speaking,  most procedures are safe.  However, with any procedure there are risks, side effects, and the possibility of complications.  The risks and complications are dependent upon the sites that are lesioned, or the type of nerve block to be performed.  The closer the procedure is to the spine, the more serious the risks are.  Great care is taken when placing the radio frequency needles, block needles or lesioning probes, but sometimes complications can occur. 1. Infection: Any time there is an injection through the skin, there is a risk of infection.  This is why sterile conditions are used for these blocks.  There are four possible types of infection. 1. Localized skin infection. 2. Central Nervous System Infection-This can be in the form of Meningitis, which can be deadly. 3. Epidural Infections-This can be in the form of an epidural abscess, which can cause pressure inside of the spine, causing compression of the spinal cord with subsequent paralysis. This would require an emergency surgery to decompress, and there are no guarantees that the patient would recover from the paralysis. 4. Discitis-This is an infection of the intervertebral discs.  It occurs in about 1% of discography procedures.  It is difficult to treat and it may lead to surgery.        2. Pain: the needles have to go through skin and soft tissues, will cause soreness.       3. Damage to internal structures:  The nerves to be lesioned may be near blood vessels or    other nerves which can be potentially damaged.       4. Bleeding: Bleeding is more common if the patient is taking blood thinners such as  aspirin, Coumadin, Ticiid, Plavix, etc., or if he/she have some genetic predisposition  such as hemophilia. Bleeding into the spinal canal can cause compression of the spinal  cord with subsequent paralysis.  This would require an emergency surgery to  decompress and there are no guarantees that the patient would recover from the  paralysis.        5. Pneumothorax:  Puncturing of a lung is a possibility, every time a needle is introduced in  the area of the chest or upper back.  Pneumothorax refers to free air around the  collapsed lung(s), inside of the thoracic cavity (chest cavity).  Another two possible  complications related to a similar event would include: Hemothorax and Chylothorax.   These are variations of the Pneumothorax, where instead of air around the collapsed  lung(s), you may have blood or chyle, respectively.       6. Spinal headaches: They may occur with any procedures in the area of the spine.       7. Persistent CSF (Cerebro-Spinal Fluid) leakage: This is a rare problem, but may occur  with prolonged intrathecal or epidural catheters either due to the formation of a fistulous  track or a dural tear.       8. Nerve damage: By working so close to the spinal cord, there is always a possibility of  nerve damage, which could be as serious as a permanent spinal cord injury with  paralysis.  9. Death:  Although rare, severe deadly allergic reactions known as "Anaphylactic  reaction" can occur to any of the medications used.      10. Worsening of the symptoms:  We can always make thing worse.  What are the chances of something like this happening? Chances of any of this occuring are extremely low.  By statistics, you have more of a chance of getting killed in a motor vehicle accident: while driving to the hospital than any of the above occurring .  Nevertheless, you should be aware that they are possibilities.  In general, it is similar to taking a shower.  Everybody knows that you can slip, hit your head and get killed.  Does that mean that you should not shower again?  Nevertheless always keep in mind that statistics do not mean anything if you happen to be on the wrong side of them.  Even if a procedure has a 1 (one) in a 1,000,000 (million) chance of going wrong, it you happen to be that one..Also, keep in mind that by  statistics, you have more of a chance of having something go wrong when taking medications.  Who should not have this procedure? If you are on a blood thinning medication (e.g. Coumadin, Plavix, see list of "Blood Thinners"), or if you have an active infection going on, you should not have the procedure.  If you are taking any blood thinners, please inform your physician.  How should I prepare for this procedure?  Do not eat or drink anything at least six hours prior to the procedure.  Bring a driver with you .  It cannot be a taxi.  Come accompanied by an adult that can drive you back, and that is strong enough to help you if your legs get weak or numb from the local anesthetic.  Take all of your medicines the morning of the procedure with just enough water to swallow them.  If you have diabetes, make sure that you are scheduled to have your procedure done first thing in the morning, whenever possible.  If you have diabetes, take only half of your insulin dose and notify our nurse that you have done so as soon as you arrive at the clinic.  If you are diabetic, but only take blood sugar pills (oral hypoglycemic), then do not take them on the morning of your procedure.  You may take them after you have had the procedure.  Do not take aspirin or any aspirin-containing medications, at least eleven (11) days prior to the procedure.  They may prolong bleeding.  Wear loose fitting clothing that may be easy to take off and that you would not mind if it got stained with Betadine or blood.  Do not wear any jewelry or perfume  Remove any nail coloring.  It will interfere with some of our monitoring equipment.  NOTE: Remember that this is not meant to be interpreted as a complete list of all possible complications.  Unforeseen problems may occur.  BLOOD THINNERS The following drugs contain aspirin or other products, which can cause increased bleeding during surgery and should not be taken for 2  weeks prior to and 1 week after surgery.  If you should need take something for relief of minor pain, you may take acetaminophen which is found in Tylenol,m Datril, Anacin-3 and Panadol. It is not blood thinner. The products listed below are.  Do not take any of the products listed below in addition to any listed on  your instruction sheet.  A.P.C or A.P.C with Codeine Codeine Phosphate Capsules #3 Ibuprofen Ridaura  ABC compound Congesprin Imuran rimadil  Advil Cope Indocin Robaxisal  Alka-Seltzer Effervescent Pain Reliever and Antacid Coricidin or Coricidin-D  Indomethacin Rufen  Alka-Seltzer plus Cold Medicine Cosprin Ketoprofen S-A-C Tablets  Anacin Analgesic Tablets or Capsules Coumadin Korlgesic Salflex  Anacin Extra Strength Analgesic tablets or capsules CP-2 Tablets Lanoril Salicylate  Anaprox Cuprimine Capsules Levenox Salocol  Anexsia-D Dalteparin Magan Salsalate  Anodynos Darvon compound Magnesium Salicylate Sine-off  Ansaid Dasin Capsules Magsal Sodium Salicylate  Anturane Depen Capsules Marnal Soma  APF Arthritis pain formula Dewitt's Pills Measurin Stanback  Argesic Dia-Gesic Meclofenamic Sulfinpyrazone  Arthritis Bayer Timed Release Aspirin Diclofenac Meclomen Sulindac  Arthritis pain formula Anacin Dicumarol Medipren Supac  Analgesic (Safety coated) Arthralgen Diffunasal Mefanamic Suprofen  Arthritis Strength Bufferin Dihydrocodeine Mepro Compound Suprol  Arthropan liquid Dopirydamole Methcarbomol with Aspirin Synalgos  ASA tablets/Enseals Disalcid Micrainin Tagament  Ascriptin Doan's Midol Talwin  Ascriptin A/D Dolene Mobidin Tanderil  Ascriptin Extra Strength Dolobid Moblgesic Ticlid  Ascriptin with Codeine Doloprin or Doloprin with Codeine Momentum Tolectin  Asperbuf Duoprin Mono-gesic Trendar  Aspergum Duradyne Motrin or Motrin IB Triminicin  Aspirin plain, buffered or enteric coated Durasal Myochrisine Trigesic  Aspirin Suppositories Easprin Nalfon Trillsate   Aspirin with Codeine Ecotrin Regular or Extra Strength Naprosyn Uracel  Atromid-S Efficin Naproxen Ursinus  Auranofin Capsules Elmiron Neocylate Vanquish  Axotal Emagrin Norgesic Verin  Azathioprine Empirin or Empirin with Codeine Normiflo Vitamin E  Azolid Emprazil Nuprin Voltaren  Bayer Aspirin plain, buffered or children's or timed BC Tablets or powders Encaprin Orgaran Warfarin Sodium  Buff-a-Comp Enoxaparin Orudis Zorpin  Buff-a-Comp with Codeine Equegesic Os-Cal-Gesic   Buffaprin Excedrin plain, buffered or Extra Strength Oxalid   Bufferin Arthritis Strength Feldene Oxphenbutazone   Bufferin plain or Extra Strength Feldene Capsules Oxycodone with Aspirin   Bufferin with Codeine Fenoprofen Fenoprofen Pabalate or Pabalate-SF   Buffets II Flogesic Panagesic   Buffinol plain or Extra Strength Florinal or Florinal with Codeine Panwarfarin   Buf-Tabs Flurbiprofen Penicillamine   Butalbital Compound Four-way cold tablets Penicillin   Butazolidin Fragmin Pepto-Bismol   Carbenicillin Geminisyn Percodan   Carna Arthritis Reliever Geopen Persantine   Carprofen Gold's salt Persistin   Chloramphenicol Goody's Phenylbutazone   Chloromycetin Haltrain Piroxlcam   Clmetidine heparin Plaquenil   Cllnoril Hyco-pap Ponstel   Clofibrate Hydroxy chloroquine Propoxyphen         Before stopping any of these medications, be sure to consult the physician who ordered them.  Some, such as Coumadin (Warfarin) are ordered to prevent or treat serious conditions such as "deep thrombosis", "pumonary embolisms", and other heart problems.  The amount of time that you may need off of the medication may also vary with the medication and the reason for which you were taking it.  If you are taking any of these medications, please make sure you notify your pain physician before you undergo any procedures.

## 2017-09-05 ENCOUNTER — Other Ambulatory Visit: Payer: Medicare Other

## 2017-09-07 ENCOUNTER — Ambulatory Visit
Admission: RE | Admit: 2017-09-07 | Discharge: 2017-09-07 | Disposition: A | Discharge status: Home / Self Care | Payer: Medicare Other | Source: Ambulatory Visit | Attending: Neurosurgery | Admitting: Neurosurgery

## 2017-09-07 ENCOUNTER — Other Ambulatory Visit: Payer: Medicare Other

## 2017-09-07 DIAGNOSIS — S32009K Unspecified fracture of unspecified lumbar vertebra, subsequent encounter for fracture with nonunion: Secondary | ICD-10-CM

## 2017-09-12 ENCOUNTER — Encounter: Payer: Self-pay | Admitting: Student in an Organized Health Care Education/Training Program

## 2017-09-12 ENCOUNTER — Ambulatory Visit (HOSPITAL_BASED_OUTPATIENT_CLINIC_OR_DEPARTMENT_OTHER): Payer: Medicare Other | Admitting: Student in an Organized Health Care Education/Training Program

## 2017-09-12 ENCOUNTER — Ambulatory Visit
Admission: RE | Admit: 2017-09-12 | Discharge: 2017-09-12 | Disposition: A | Discharge status: Home / Self Care | Payer: Medicare Other | Source: Ambulatory Visit | Attending: Student in an Organized Health Care Education/Training Program | Admitting: Student in an Organized Health Care Education/Training Program

## 2017-09-12 VITALS — BP 131/66 | HR 77 | Temp 97.5°F | Resp 15 | Ht 66.0 in | Wt 270.0 lb

## 2017-09-12 DIAGNOSIS — Z9851 Tubal ligation status: Secondary | ICD-10-CM | POA: Diagnosis not present

## 2017-09-12 DIAGNOSIS — M4712 Other spondylosis with myelopathy, cervical region: Secondary | ICD-10-CM | POA: Diagnosis not present

## 2017-09-12 DIAGNOSIS — Z885 Allergy status to narcotic agent status: Secondary | ICD-10-CM | POA: Insufficient documentation

## 2017-09-12 DIAGNOSIS — Z6841 Body Mass Index (BMI) 40.0 and over, adult: Secondary | ICD-10-CM | POA: Insufficient documentation

## 2017-09-12 DIAGNOSIS — Z9889 Other specified postprocedural states: Secondary | ICD-10-CM | POA: Insufficient documentation

## 2017-09-12 DIAGNOSIS — M4722 Other spondylosis with radiculopathy, cervical region: Secondary | ICD-10-CM | POA: Insufficient documentation

## 2017-09-12 DIAGNOSIS — M542 Cervicalgia: Secondary | ICD-10-CM | POA: Diagnosis not present

## 2017-09-12 DIAGNOSIS — B20 Human immunodeficiency virus [HIV] disease: Secondary | ICD-10-CM | POA: Insufficient documentation

## 2017-09-12 MED ORDER — LACTATED RINGERS IV SOLN
1000.0000 mL | Freq: Once | INTRAVENOUS | Status: AC
  Administered 2017-09-12: 1000 mL via INTRAVENOUS

## 2017-09-12 MED ORDER — LIDOCAINE HCL 1 % IJ SOLN
10.0000 mL | Freq: Once | INTRAMUSCULAR | Status: AC
  Administered 2017-09-12: 5 mL
  Filled 2017-09-12: qty 10

## 2017-09-12 MED ORDER — DEXAMETHASONE SODIUM PHOSPHATE 10 MG/ML IJ SOLN
10.0000 mg | Freq: Once | INTRAMUSCULAR | Status: AC
  Administered 2017-09-12: 10 mg
  Filled 2017-09-12: qty 1

## 2017-09-12 MED ORDER — LIDOCAINE HCL (PF) 1 % IJ SOLN
INTRAMUSCULAR | Status: AC
  Filled 2017-09-12: qty 5

## 2017-09-12 MED ORDER — ROPIVACAINE HCL 2 MG/ML IJ SOLN
10.0000 mL | Freq: Once | INTRAMUSCULAR | Status: AC
  Administered 2017-09-12: 10 mL
  Filled 2017-09-12: qty 10

## 2017-09-12 MED ORDER — FENTANYL CITRATE (PF) 100 MCG/2ML IJ SOLN
25.0000 ug | INTRAMUSCULAR | Status: DC | PRN
  Administered 2017-09-12: 75 ug via INTRAVENOUS
  Filled 2017-09-12: qty 2

## 2017-09-12 NOTE — Progress Notes (Signed)
Patient's Name: Melanie Bowen  MRN: [id number]  Referring Provider: Theotis Burrow*  DOB: [birth year]  PCP: Theotis Burrow, MD  DOS: 09/12/2017  Note by: Gillis Santa, MD  Service setting: Ambulatory outpatient  Specialty: Interventional Pain Management  Patient type: Established  Location: [facility] (AMB) Pain Management Facility  Visit type: Interventional Procedure   Primary Reason for Visit: Interventional Pain Management Treatment. CC: Neck Pain (bilateral worse on the right )  Procedure:  Anesthesia, Analgesia, Anxiolysis:  Type: Therapeutic Cervical Facet Medial Branch Block #3 with steroid Region: Posterolateral cervical spine region Level:  C4, C5, C6, & C7 Medial Branch Level(s) Laterality: Right Paraspinal  Type: Local Anesthesia with Moderate (Conscious) Sedation Local Anesthetic: Lidocaine 1% Route: Intravenous (IV) IV Access: Secured Sedation: Meaningful verbal contact was maintained at all times during the procedure  Indication(s): Analgesia and Anxiety   Indications: 1. Spondylosis, cervical, with myelopathy   2. Cervicalgia   3. Osteoarthritis of spine with radiculopathy, cervical region   4. Morbid obesity ([place])   5. Neck pain   6. HIV disease ([place])    Pain Score: Pre-procedure: 8 /10 Post-procedure: 0-No pain/10  Pre-op Assessment:  Melanie Bowen is a 57 y.o. (year old), female patient, seen today for interventional treatment. She  has a past surgical history that includes Knee surgery (Bilateral, [id number]); Hand surgery (Bilateral); Tubal ligation; Laparoscopy for ectopic pregnancy; Bunionectomy (Bilateral); Finger fracture (Right); back surgery may 2016; Video bronchoscopy (N/A, 06/30/2015); Diagnostic laparoscopy; Back surgery; Joint replacement (Bilateral); Cardiac catheterization (N/A, 01/12/2016); Cardiac catheterization (N/A, 01/12/2016); and Esophagogastroduodenoscopy (Left, 01/11/2016). Melanie Bowen has a current medication list which includes the  following prescription(s): albuterol, amlodipine, azelastine hcl, clobetasol ointment, dexilant, fluticasone-salmeterol, genvoya, hydrochlorothiazide, ipratropium-albuterol, losartan, omeprazole, oxycodone, promethazine, valacyclovir, amoxicillin-clavulanate, benzonatate, chlorpheniramine-hydrocodone, dicyclomine, efavirenz-emtricitabine-tenofovir, guaifenesin-codeine, magic mouthwash, meloxicam, methocarbamol, naproxen, polyethylene glycol, prednisone, prednisone, topiramate, and trazodone, and the following Facility-Administered Medications: fentanyl. Her primarily concern today is the Neck Pain (bilateral worse on the right )  Initial Vital Signs: Blood pressure 118/70, pulse 82, temperature 98 F (36.7 C), temperature source Oral, resp. rate 16, height 5\' 6"  (1.676 m), weight 300 lb (136.1 kg), SpO2 100 %. BMI: Estimated body mass index is 43.58 kg/m as calculated from the following:   Height as of this encounter: 5\' 6"  (1.676 m).   Weight as of this encounter: 270 lb (122.5 kg).  Risk Assessment: Allergies: Reviewed. She is allergic to morphine and related; gabapentin; and zanaflex  [tizanidine].  Allergy Precautions: None required Coagulopathies: Reviewed. None identified.  Blood-thinner therapy: None at this time Active Infection(s): Reviewed. None identified. Melanie Bowen is afebrile  Site Confirmation: Melanie Bowen was asked to confirm the procedure and laterality before marking the site Procedure checklist: Completed Consent: Before the procedure and under the influence of no sedative(s), amnesic(s), or anxiolytics, the patient was informed of the treatment options, risks and possible complications. To fulfill our ethical and legal obligations, as recommended by the American Medical Association's Code of Ethics, I have informed the patient of my clinical impression; the nature and purpose of the treatment or procedure; the risks, benefits, and possible complications of the intervention; the  alternatives, including doing nothing; the risk(s) and benefit(s) of the alternative treatment(s) or procedure(s); and the risk(s) and benefit(s) of doing nothing. The patient was provided information about the general risks and possible complications associated with the procedure. These may include, but are not limited to: failure to achieve desired goals, infection, bleeding, organ or nerve damage, allergic reactions, paralysis, and  death. In addition, the patient was informed of those risks and complications associated to Spine-related procedures, such as failure to decrease pain; infection (i.e.: Meningitis, epidural or intraspinal abscess); bleeding (i.e.: epidural hematoma, subarachnoid hemorrhage, or any other type of intraspinal or peri-dural bleeding); organ or nerve damage (i.e.: Any type of peripheral nerve, nerve root, or spinal cord injury) with subsequent damage to sensory, motor, and/or autonomic systems, resulting in permanent pain, numbness, and/or weakness of one or several areas of the body; allergic reactions; (i.e.: anaphylactic reaction); and/or death. Furthermore, the patient was informed of those risks and complications associated with the medications. These include, but are not limited to: allergic reactions (i.e.: anaphylactic or anaphylactoid reaction(s)); adrenal axis suppression; blood sugar elevation that in diabetics may result in ketoacidosis or comma; water retention that in patients with history of congestive heart failure may result in shortness of breath, pulmonary edema, and decompensation with resultant heart failure; weight gain; swelling or edema; medication-induced neural toxicity; particulate matter embolism and blood vessel occlusion with resultant organ, and/or nervous system infarction; and/or aseptic necrosis of one or more joints. Finally, the patient was informed that Medicine is not an exact science; therefore, there is also the possibility of unforeseen or  unpredictable risks and/or possible complications that may result in a catastrophic outcome. The patient indicated having understood very clearly. We have given the patient no guarantees and we have made no promises. Enough time was given to the patient to ask questions, all of which were answered to the patient's satisfaction. Melanie Bowen has indicated that she wanted to continue with the procedure. Attestation: I, the ordering provider, attest that I have discussed with the patient the benefits, risks, side-effects, alternatives, likelihood of achieving goals, and potential problems during recovery for the procedure that I have provided informed consent. Date: 09/12/2017; Time: 11:02 AM  Pre-Procedure Preparation:  Monitoring: As per clinic protocol. Respiration, ETCO2, SpO2, BP, heart rate and rhythm monitor placed and checked for adequate function Safety Precautions: Patient was assessed for positional comfort and pressure points before starting the procedure. Time-out: I initiated and conducted the "Time-out" before starting the procedure, as per protocol. The patient was asked to participate by confirming the accuracy of the "Time Out" information. Verification of the correct person, site, and procedure were performed and confirmed by me, the nursing staff, and the patient. "Time-out" conducted as per Joint Commission's Universal Protocol (UP.01.01.01). "Time-out" Date & Time: 09/12/2017; 1108 hrs.  Description of Procedure Process:   Position: Prone with head of the table was raised to facilitate breathing. Target Area: For Cervical Facet blocks, the target is the postero-lateral waist of the articular pillars at the  C4, C5, C6, & C7 levels. Approach: Posterior approach. Area Prepped: Entire Posterior Cervico-thoracic Region Prepping solution: ChloraPrep (2% chlorhexidine gluconate and 70% isopropyl alcohol) Safety Precautions: Aspiration looking for blood return was conducted prior to all  injections. At no point did we inject any substances, as a needle was being advanced. No attempts were made at seeking any paresthesias. Safe injection practices and needle disposal techniques used. Medications properly checked for expiration dates. SDV (single dose vial) medications used. Description of the Procedure: Protocol guidelines were followed. The patient was placed in position over the fluoroscopy table. The target area was identified and the area prepped in the usual manner. Skin desensitized using vapocoolant spray. Skin & deeper tissues infiltrated with local anesthetic. Appropriate amount of time allowed to pass for local anesthetics to take effect. The procedure needle was introduced  through the skin, ipsilateral to the reported pain, and advanced to the target area. Bone was contacted on the posterior aspect of the articular pillars and the needle walked lateral, until the border was cleared. Lateral views taken to make sure the needle tip did not advance past the posterior third of the lateral mass of the posterior columns. The procedure was repeated in identical fashion for each level. Negative aspiration confirmed. Solution injected in intermittent fashion, asking for systemic symptoms every 0.5cc of injectate. The needles were then removed and the area cleansed, making sure to leave some of the prepping solution back to take advantage of its long term bactericidal properties. Vitals:   09/12/17 1125 09/12/17 1135 09/12/17 1145 09/12/17 1155  BP: (!) 150/81 138/65 131/61 131/66  Pulse:      Resp: 18 14 18 15   Temp:  98.4 F (36.9 C)  (!) 97.5 F (36.4 C)  TempSrc:      SpO2: 97% 97% 98% 98%  Weight:      Height:        Start Time: 1108 hrs. End Time: 1124 hrs. Materials:  Needle(s) Type: Regular needle Gauge: 22G Length: 3.5-in Medication(s): We administered lactated ringers, fentaNYL, lidocaine, ropivacaine (PF) 2 mg/mL (0.2%), and dexamethasone. Please see chart orders for  dosing details. 5 cc solution made of 4 cc of 0.2% ropivacaine, 1 cc of Decadron 10 mg/cc.  Approximately 1 cc injected at each level. Imaging Guidance (Spinal):  Type of Imaging Technique: Fluoroscopy Guidance (Spinal) Indication(s): Assistance in needle guidance and placement for procedures requiring needle placement in or near specific anatomical locations not easily accessible without such assistance. Exposure Time: Please see nurses notes. Contrast: None used. Fluoroscopic Guidance: I was personally present during the use of fluoroscopy. "Tunnel Vision Technique" used to obtain the best possible view of the target area. Parallax error corrected before commencing the procedure. "Direction-depth-direction" technique used to introduce the needle under continuous pulsed fluoroscopy. Once target was reached, antero-posterior, oblique, and lateral fluoroscopic projection used confirm needle placement in all planes. Images permanently stored in EMR. Interpretation: No contrast injected. I personally interpreted the imaging intraoperatively. Adequate needle placement confirmed in multiple planes. Permanent images saved into the patient's record.  Antibiotic Prophylaxis:  Indication(s): None identified Antibiotic given: None  Post-operative Assessment:  EBL: None Complications: No immediate post-treatment complications observed by team, or reported by patient. Note: The patient tolerated the entire procedure well. A repeat set of vitals were taken after the procedure and the patient was kept under observation following institutional policy, for this type of procedure. Post-procedural neurological assessment was performed, showing return to baseline, prior to discharge. The patient was provided with post-procedure discharge instructions, including a section on how to identify potential problems. Should any problems arise concerning this procedure, the patient was given instructions to immediately contact  us, at any time, without hesitation. In any case, we plan to contact the patient by telephone for a follow-up status report regarding this interventional procedure. Comments:  No additional relevant information. 5 out of 5 strength bilateral upper extremity: Shoulder abduction, elbow flexion, elbow extension, thumb extension.  Plan of Care    Imaging Orders     DG C-Arm 1-60 Min-No Report Procedure Orders    No procedure(s) ordered today    Medications ordered for procedure: Meds ordered this encounter  Medications  . lactated ringers infusion 1,000 mL  . fentaNYL (SUBLIMAZE) injection 25-100 mcg    Make sure Narcan is available in the pyxis when  using this medication. In the event of respiratory depression (RR< 8/min): Titrate NARCAN (naloxone) in increments of 0.1 to 0.2 mg IV at 2-3 minute intervals, until desired degree of reversal.  . lidocaine (XYLOCAINE) 1 % (with pres) injection 10 mL  . ropivacaine (PF) 2 mg/mL (0.2%) (NAROPIN) injection 10 mL  . dexamethasone (DECADRON) injection 10 mg   Medications administered: We administered lactated ringers, fentaNYL, lidocaine, ropivacaine (PF) 2 mg/mL (0.2%), and dexamethasone.  See the medical record for exact dosing, route, and time of administration.  New Prescriptions   No medications on file   Disposition: Discharge home  Discharge Date & Time: 09/12/2017; 1208 hrs.   Physician-requested Follow-up: Return in about 4 weeks (around 10/10/2017) for Post Procedure Evaluation. Future Appointments  Date Time Provider [place]  10/06/2017 10:00 AM Gillis Santa, MD [facility] None   Primary Care Physician: Theotis Burrow, MD Location: [facility] Note by: Gillis Santa, MD Date: 09/12/2017; Time: 1:10 PM  Disclaimer:  Medicine is not an exact science. The only guarantee in medicine is that nothing is guaranteed. It is important to note that the decision to proceed with this  intervention was based on the information collected from the patient. The Data and conclusions were drawn from the patient's questionnaire, the interview, and the physical examination. Because the information was provided in large part by the patient, it cannot be guaranteed that it has not been purposely or unconsciously manipulated. Every effort has been made to obtain as much relevant data as possible for this evaluation. It is important to note that the conclusions that lead to this procedure are derived in large part from the available data. Always take into account that the treatment will also be dependent on availability of resources and existing treatment guidelines, considered by other Pain Management Practitioners as being common knowledge and practice, at the time of the intervention. For Medico-Legal purposes, it is also important to point out that variation in procedural techniques and pharmacological choices are the acceptable norm. The indications, contraindications, technique, and results of the above procedure should only be interpreted and judged by a Board-Certified Interventional Pain Specialist with extensive familiarity and expertise in the same exact procedure and technique.

## 2017-09-12 NOTE — Patient Instructions (Signed)
Pain Management Discharge Instructions  General Discharge Instructions :  If you need to reach your doctor call: Monday-Friday 8:00 am - 4:00 pm at [phone] or toll free [phone].  After clinic hours [phone] to have operator reach doctor.  Bring all of your medication bottles to all your appointments in the pain clinic.  To cancel or reschedule your appointment with Pain Management please remember to call 24 hours in advance to avoid a fee.  Refer to the educational materials which you have been given on: General Risks, I had my Procedure. Discharge Instructions, Post Sedation.  Post Procedure Instructions:  The drugs you were given will stay in your system until tomorrow, so for the next 24 hours you should not drive, make any legal decisions or drink any alcoholic beverages.  You may eat anything you prefer, but it is better to start with liquids then soups and crackers, and gradually work up to solid foods.  Please notify your doctor immediately if you have any unusual bleeding, trouble breathing or pain that is not related to your normal pain.  Depending on the type of procedure that was done, some parts of your body may feel week and/or numb.  This usually clears up by tonight or the next day.  Walk with the use of an assistive device or accompanied by an adult for the 24 hours.  You may use ice on the affected area for the first 24 hours.  Put ice in a Ziploc bag and cover with a towel and place against area 15 minutes on 15 minutes off.  You may switch to heat after 24 hours.GENERAL RISKS AND COMPLICATIONS  What are the risk, side effects and possible complications? Generally speaking, most procedures are safe.  However, with any procedure there are risks, side effects, and the possibility of complications.  The risks and complications are dependent upon the sites that are lesioned, or the type of nerve block to be performed.  The closer the procedure is to the spine,  the more serious the risks are.  Great care is taken when placing the radio frequency needles, block needles or lesioning probes, but sometimes complications can occur. 1. Infection: Any time there is an injection through the skin, there is a risk of infection.  This is why sterile conditions are used for these blocks.  There are four possible types of infection. 1. Localized skin infection. 2. Central Nervous System Infection-This can be in the form of Meningitis, which can be deadly. 3. Epidural Infections-This can be in the form of an epidural abscess, which can cause pressure inside of the spine, causing compression of the spinal cord with subsequent paralysis. This would require an emergency surgery to decompress, and there are no guarantees that the patient would recover from the paralysis. 4. Discitis-This is an infection of the intervertebral discs.  It occurs in about 1% of discography procedures.  It is difficult to treat and it may lead to surgery.        2. Pain: the needles have to go through skin and soft tissues, will cause soreness.       3. Damage to internal structures:  The nerves to be lesioned may be near blood vessels or    other nerves which can be potentially damaged.       4. Bleeding: Bleeding is more common if the patient is taking blood thinners such as  aspirin, Coumadin, Ticiid, Plavix, etc., or if he/she have some genetic predisposition  such as   hemophilia. Bleeding into the spinal canal can cause compression of the spinal  cord with subsequent paralysis.  This would require an emergency surgery to  decompress and there are no guarantees that the patient would recover from the  paralysis.       5. Pneumothorax:  Puncturing of a lung is a possibility, every time a needle is introduced in  the area of the chest or upper back.  Pneumothorax refers to free air around the  collapsed lung(s), inside of the thoracic cavity (chest cavity).  Another two possible  complications  related to a similar event would include: Hemothorax and Chylothorax.   These are variations of the Pneumothorax, where instead of air around the collapsed  lung(s), you may have blood or chyle, respectively.       6. Spinal headaches: They may occur with any procedures in the area of the spine.       7. Persistent CSF (Cerebro-Spinal Fluid) leakage: This is a rare problem, but may occur  with prolonged intrathecal or epidural catheters either due to the formation of a fistulous  track or a dural tear.       8. Nerve damage: By working so close to the spinal cord, there is always a possibility of  nerve damage, which could be as serious as a permanent spinal cord injury with  paralysis.       9. Death:  Although rare, severe deadly allergic reactions known as "Anaphylactic  reaction" can occur to any of the medications used.      10. Worsening of the symptoms:  We can always make thing worse.  What are the chances of something like this happening? Chances of any of this occuring are extremely low.  By statistics, you have more of a chance of getting killed in a motor vehicle accident: while driving to the hospital than any of the above occurring .  Nevertheless, you should be aware that they are possibilities.  In general, it is similar to taking a shower.  Everybody knows that you can slip, hit your head and get killed.  Does that mean that you should not shower again?  Nevertheless always keep in mind that statistics do not mean anything if you happen to be on the wrong side of them.  Even if a procedure has a 1 (one) in a 1,000,000 (million) chance of going wrong, it you happen to be that one..Also, keep in mind that by statistics, you have more of a chance of having something go wrong when taking medications.  Who should not have this procedure? If you are on a blood thinning medication (e.g. Coumadin, Plavix, see list of "Blood Thinners"), or if you have an active infection going on, you should not  have the procedure.  If you are taking any blood thinners, please inform your physician.  How should I prepare for this procedure?  Do not eat or drink anything at least six hours prior to the procedure.  Bring a driver with you .  It cannot be a taxi.  Come accompanied by an adult that can drive you back, and that is strong enough to help you if your legs get weak or numb from the local anesthetic.  Take all of your medicines the morning of the procedure with just enough water to swallow them.  If you have diabetes, make sure that you are scheduled to have your procedure done first thing in the morning, whenever possible.  If you have diabetes,   take only half of your insulin dose and notify our nurse that you have done so as soon as you arrive at the clinic.  If you are diabetic, but only take blood sugar pills (oral hypoglycemic), then do not take them on the morning of your procedure.  You may take them after you have had the procedure.  Do not take aspirin or any aspirin-containing medications, at least eleven (11) days prior to the procedure.  They may prolong bleeding.  Wear loose fitting clothing that may be easy to take off and that you would not mind if it got stained with Betadine or blood.  Do not wear any jewelry or perfume  Remove any nail coloring.  It will interfere with some of our monitoring equipment.  NOTE: Remember that this is not meant to be interpreted as a complete list of all possible complications.  Unforeseen problems may occur.  BLOOD THINNERS The following drugs contain aspirin or other products, which can cause increased bleeding during surgery and should not be taken for 2 weeks prior to and 1 week after surgery.  If you should need take something for relief of minor pain, you may take acetaminophen which is found in Tylenol,m Datril, Anacin-3 and Panadol. It is not blood thinner. The products listed below are.  Do not take any of the products listed below  in addition to any listed on your instruction sheet.  A.P.C or A.P.C with Codeine Codeine Phosphate Capsules #3 Ibuprofen Ridaura  ABC compound Congesprin Imuran rimadil  Advil Cope Indocin Robaxisal  Alka-Seltzer Effervescent Pain Reliever and Antacid Coricidin or Coricidin-D  Indomethacin Rufen  Alka-Seltzer plus Cold Medicine Cosprin Ketoprofen S-A-C Tablets  Anacin Analgesic Tablets or Capsules Coumadin Korlgesic Salflex  Anacin Extra Strength Analgesic tablets or capsules CP-2 Tablets Lanoril Salicylate  Anaprox Cuprimine Capsules Levenox Salocol  Anexsia-D Dalteparin Magan Salsalate  Anodynos Darvon compound Magnesium Salicylate Sine-off  Ansaid Dasin Capsules Magsal Sodium Salicylate  Anturane Depen Capsules Marnal Soma  APF Arthritis pain formula Dewitt's Pills Measurin Stanback  Argesic Dia-Gesic Meclofenamic Sulfinpyrazone  Arthritis Bayer Timed Release Aspirin Diclofenac Meclomen Sulindac  Arthritis pain formula Anacin Dicumarol Medipren Supac  Analgesic (Safety coated) Arthralgen Diffunasal Mefanamic Suprofen  Arthritis Strength Bufferin Dihydrocodeine Mepro Compound Suprol  Arthropan liquid Dopirydamole Methcarbomol with Aspirin Synalgos  ASA tablets/Enseals Disalcid Micrainin Tagament  Ascriptin Doan's Midol Talwin  Ascriptin A/D Dolene Mobidin Tanderil  Ascriptin Extra Strength Dolobid Moblgesic Ticlid  Ascriptin with Codeine Doloprin or Doloprin with Codeine Momentum Tolectin  Asperbuf Duoprin Mono-gesic Trendar  Aspergum Duradyne Motrin or Motrin IB Triminicin  Aspirin plain, buffered or enteric coated Durasal Myochrisine Trigesic  Aspirin Suppositories Easprin Nalfon Trillsate  Aspirin with Codeine Ecotrin Regular or Extra Strength Naprosyn Uracel  Atromid-S Efficin Naproxen Ursinus  Auranofin Capsules Elmiron Neocylate Vanquish  Axotal Emagrin Norgesic Verin  Azathioprine Empirin or Empirin with Codeine Normiflo Vitamin E  Azolid Emprazil Nuprin Voltaren  Bayer  Aspirin plain, buffered or children's or timed BC Tablets or powders Encaprin Orgaran Warfarin Sodium  Buff-a-Comp Enoxaparin Orudis Zorpin  Buff-a-Comp with Codeine Equegesic Os-Cal-Gesic   Buffaprin Excedrin plain, buffered or Extra Strength Oxalid   Bufferin Arthritis Strength Feldene Oxphenbutazone   Bufferin plain or Extra Strength Feldene Capsules Oxycodone with Aspirin   Bufferin with Codeine Fenoprofen Fenoprofen Pabalate or Pabalate-SF   Buffets II Flogesic Panagesic   Buffinol plain or Extra Strength Florinal or Florinal with Codeine Panwarfarin   Buf-Tabs Flurbiprofen Penicillamine   Butalbital Compound Four-way cold tablets   Penicillin   Butazolidin Fragmin Pepto-Bismol   Carbenicillin Geminisyn Percodan   Carna Arthritis Reliever Geopen Persantine   Carprofen Gold's salt Persistin   Chloramphenicol Goody's Phenylbutazone   Chloromycetin Haltrain Piroxlcam   Clmetidine heparin Plaquenil   Cllnoril Hyco-pap Ponstel   Clofibrate Hydroxy chloroquine Propoxyphen         Before stopping any of these medications, be sure to consult the physician who ordered them.  Some, such as Coumadin (Warfarin) are ordered to prevent or treat serious conditions such as "deep thrombosis", "pumonary embolisms", and other heart problems.  The amount of time that you may need off of the medication may also vary with the medication and the reason for which you were taking it.  If you are taking any of these medications, please make sure you notify your pain physician before you undergo any procedures.         Facet Blocks Patient Information  Description: The facets are joints in the spine between the vertebrae.  Like any joints in the body, facets can become irritated and painful.  Arthritis can also effect the facets.  By injecting steroids and local anesthetic in and around these joints, we can temporarily block the nerve supply to them.  Steroids act directly on irritated nerves and tissues  to reduce selling and inflammation which often leads to decreased pain.  Facet blocks may be done anywhere along the spine from the neck to the low back depending upon the location of your pain.   After numbing the skin with local anesthetic (like Novocaine), a small needle is passed onto the facet joints under x-ray guidance.  You may experience a sensation of pressure while this is being done.  The entire block usually lasts about 15-25 minutes.   Conditions which may be treated by facet blocks:   Low back/buttock pain  Neck/shoulder pain  Certain types of headaches  Preparation for the injection:  1. Do not eat any solid food or dairy products within 8 hours of your appointment. 2. You may drink clear liquid up to 3 hours before appointment.  Clear liquids include water, black coffee, juice or soda.  No milk or cream please. 3. You may take your regular medication, including pain medications, with a sip of water before your appointment.  Diabetics should hold regular insulin (if taken separately) and take 1/2 normal NPH dose the morning of the procedure.  Carry some sugar containing items with you to your appointment. 4. A driver must accompany you and be prepared to drive you home after your procedure. 5. Bring all your current medications with you. 6. An IV may be inserted and sedation may be given at the discretion of the physician. 7. A blood pressure cuff, EKG and other monitors will often be applied during the procedure.  Some patients may need to have extra oxygen administered for a short period. 8. You will be asked to provide medical information, including your allergies and medications, prior to the procedure.  We must know immediately if you are taking blood thinners (like Coumadin/Warfarin) or if you are allergic to IV iodine contrast (dye).  We must know if you could possible be pregnant.  Possible side-effects:   Bleeding from needle site  Infection (rare, may require  surgery)  Nerve injury (rare)  Numbness & tingling (temporary)  Difficulty urinating (rare, temporary)  Spinal headache (a headache worse with upright posture)  Light-headedness (temporary)  Pain at injection site (serveral days)  Decreased blood pressure (rare,   temporary)  Weakness in arm/leg (temporary)  Pressure sensation in back/neck (temporary)   Call if you experience:   Fever/chills associated with headache or increased back/neck pain  Headache worsened by an upright position  New onset, weakness or numbness of an extremity below the injection site  Hives or difficulty breathing (go to the emergency room)  Inflammation or drainage at the injection site(s)  Severe back/neck pain greater than usual  New symptoms which are concerning to you  Please note:  Although the local anesthetic injected can often make your back or neck feel good for several hours after the injection, the pain will likely return. It takes 3-7 days for steroids to work.  You may not notice any pain relief for at least one week.  If effective, we will often do a series of 2-3 injections spaced 3-6 weeks apart to maximally decrease your pain.  After the initial series, you may be a candidate for a more permanent nerve block of the facets.  If you have any questions, please call [phone] [facility] 

## 2017-09-12 NOTE — Progress Notes (Signed)
Safety precautions to be maintained throughout the outpatient stay will include: orient to surroundings, keep bed in low position, maintain call bell within reach at all times, provide assistance with transfer out of bed and ambulation.  

## 2017-10-06 ENCOUNTER — Ambulatory Visit: Payer: Medicare Other | Admitting: Student in an Organized Health Care Education/Training Program

## 2017-10-24 ENCOUNTER — Other Ambulatory Visit: Payer: Self-pay

## 2017-10-25 ENCOUNTER — Ambulatory Visit: Payer: Medicare Other | Admitting: Student in an Organized Health Care Education/Training Program

## 2017-11-03 ENCOUNTER — Encounter: Payer: Self-pay | Admitting: Student in an Organized Health Care Education/Training Program

## 2017-11-03 ENCOUNTER — Ambulatory Visit
Payer: Medicare Other | Attending: Student in an Organized Health Care Education/Training Program | Admitting: Student in an Organized Health Care Education/Training Program

## 2017-11-03 VITALS — BP 140/88 | HR 97 | Temp 98.2°F | Resp 16 | Ht 66.0 in | Wt 304.0 lb

## 2017-11-03 DIAGNOSIS — J449 Chronic obstructive pulmonary disease, unspecified: Secondary | ICD-10-CM | POA: Diagnosis not present

## 2017-11-03 DIAGNOSIS — Z803 Family history of malignant neoplasm of breast: Secondary | ICD-10-CM | POA: Insufficient documentation

## 2017-11-03 DIAGNOSIS — F329 Major depressive disorder, single episode, unspecified: Secondary | ICD-10-CM | POA: Insufficient documentation

## 2017-11-03 DIAGNOSIS — G894 Chronic pain syndrome: Secondary | ICD-10-CM | POA: Insufficient documentation

## 2017-11-03 DIAGNOSIS — R202 Paresthesia of skin: Secondary | ICD-10-CM | POA: Insufficient documentation

## 2017-11-03 DIAGNOSIS — Z21 Asymptomatic human immunodeficiency virus [HIV] infection status: Secondary | ICD-10-CM | POA: Diagnosis not present

## 2017-11-03 DIAGNOSIS — Z7951 Long term (current) use of inhaled steroids: Secondary | ICD-10-CM | POA: Diagnosis not present

## 2017-11-03 DIAGNOSIS — M542 Cervicalgia: Secondary | ICD-10-CM | POA: Insufficient documentation

## 2017-11-03 DIAGNOSIS — I10 Essential (primary) hypertension: Secondary | ICD-10-CM | POA: Insufficient documentation

## 2017-11-03 DIAGNOSIS — B20 Human immunodeficiency virus [HIV] disease: Secondary | ICD-10-CM | POA: Diagnosis not present

## 2017-11-03 DIAGNOSIS — S32009K Unspecified fracture of unspecified lumbar vertebra, subsequent encounter for fracture with nonunion: Secondary | ICD-10-CM

## 2017-11-03 DIAGNOSIS — M5136 Other intervertebral disc degeneration, lumbar region: Secondary | ICD-10-CM

## 2017-11-03 DIAGNOSIS — Z8701 Personal history of pneumonia (recurrent): Secondary | ICD-10-CM | POA: Insufficient documentation

## 2017-11-03 DIAGNOSIS — Z79899 Other long term (current) drug therapy: Secondary | ICD-10-CM | POA: Diagnosis not present

## 2017-11-03 DIAGNOSIS — F17210 Nicotine dependence, cigarettes, uncomplicated: Secondary | ICD-10-CM | POA: Diagnosis not present

## 2017-11-03 DIAGNOSIS — M545 Low back pain: Secondary | ICD-10-CM | POA: Diagnosis present

## 2017-11-03 DIAGNOSIS — M47812 Spondylosis without myelopathy or radiculopathy, cervical region: Secondary | ICD-10-CM | POA: Diagnosis not present

## 2017-11-03 DIAGNOSIS — Z885 Allergy status to narcotic agent status: Secondary | ICD-10-CM | POA: Diagnosis not present

## 2017-11-03 DIAGNOSIS — Z888 Allergy status to other drugs, medicaments and biological substances status: Secondary | ICD-10-CM | POA: Insufficient documentation

## 2017-11-03 DIAGNOSIS — K219 Gastro-esophageal reflux disease without esophagitis: Secondary | ICD-10-CM | POA: Diagnosis not present

## 2017-11-03 MED ORDER — KETOROLAC TROMETHAMINE 60 MG/2ML IM SOLN
INTRAMUSCULAR | Status: AC
  Filled 2017-11-03: qty 2

## 2017-11-03 MED ORDER — ORPHENADRINE CITRATE 30 MG/ML IJ SOLN
60.0000 mg | Freq: Once | INTRAMUSCULAR | Status: AC
  Administered 2017-11-03: 60 mg via INTRAMUSCULAR

## 2017-11-03 MED ORDER — ORPHENADRINE CITRATE 30 MG/ML IJ SOLN
INTRAMUSCULAR | Status: AC
  Filled 2017-11-03: qty 2

## 2017-11-03 MED ORDER — KETOROLAC TROMETHAMINE 60 MG/2ML IM SOLN
60.0000 mg | Freq: Once | INTRAMUSCULAR | Status: AC
  Administered 2017-11-03: 60 mg via INTRAMUSCULAR

## 2017-11-03 NOTE — Progress Notes (Signed)
Patient's Name: Melanie Bowen  MRN: [id number]  Referring Provider: Theotis Burrow*  DOB: 1961/04/08  PCP: Theotis Burrow, MD  DOS: 11/03/2017  Note by: Gillis Santa, MD  Service setting: Ambulatory outpatient  Specialty: Interventional Pain Management  Location: [facility] (AMB) Pain Management Facility    Patient type: Established   Primary Reason(s) for Visit: Encounter for prescription drug management & post-procedure evaluation of chronic illness with mild to moderate exacerbation(Level of risk: moderate) CC: Back Pain (lower left is worse ); Leg Pain (left); Neck Pain (right); and Hip Pain (left)  HPI  Melanie Bowen is a 57 y.o. year old, female patient, who comes today for a post-procedure evaluation and medication management. She has Asthma, chronic; DDD (degenerative disc disease), lumbar; Degenerative disc disease, lumbar; Cough; Chronic cough; Lumbar pseudoarthrosis; GI bleed; GERD (gastroesophageal reflux disease); HTN (hypertension); Depression; HIV (human immunodeficiency virus infection) ([place]); Anemia; Symptomatic anemia; Lower GI bleed; Tobacco abuse counseling; Allergic rhinitis; Community acquired pneumonia; and Pneumonia on their problem list. Her primarily concern today is the Back Pain (lower left is worse ); Leg Pain (left); Neck Pain (right); and Hip Pain (left)  Pain Assessment: Location: (right) (neck) Radiating: also neck pain causing numbness and tingling in the right hand Onset: More than a month ago Duration: Chronic pain Quality: Tingling, Numbness, Discomfort, Constant, Sharp Severity: 9 /10 (self-reported pain score)  Note: Reported level is inconsistent with clinical observations. Clinically the patient looks like a 2/10             When using our objective Pain Scale, levels between 6 and 10/10 are said to belong in an emergency room, as it progressively worsens from a 6/10, described as severely limiting, requiring emergency care not usually available  at an outpatient pain management facility. At a 6/10 level, communication becomes difficult and requires great effort. Assistance to reach the emergency department may be required. Facial flushing and profuse sweating along with potentially dangerous increases in heart rate and blood pressure will be evident. Effect on ADL: activity makes it worse.  very difficult to get comfortable,  sleep pattern interrupted.  Timing: Constant Modifying factors: pain medication when she had it helped her pain.    Melanie Bowen was last seen on 09/12/2017 for a procedure. During today's appointment we reviewed Melanie Bowen's post-procedure results, as well as her outpatient medication regimen.  57 year old female with a history of right-sided neck pain that radiates down to her right shoulder secondary to cervical spondylosis and degenerative disc disease status post right C4, C5, C6, C7 medial branch nerve block performed on 09/12/2017 who presents for follow-up.  Patient notes improvement in her right neck pain and states that she has greater range of motion with her right neck.  She is continuing to endorse axial low back pain that is worse with axial loading and lumbar extension.  She is not interested in receiving injections in her lumbar spine given bad reaction in the past.  She is also notify me that her neurosurgeon would like for me to take over her chronic opioid therapy.  Patient is currently on oxycodone 15 mg 3 times daily as needed, last fill 09/22/2017, MME equals 88.  Further details on both, my assessment(s), as well as the proposed treatment plan, please see below.  Controlled Substance Pharmacotherapy Assessment REMS (Risk Evaluation and Mitigation Strategy)  Analgesic: Oxycodone 15 mg 3 times daily as needed, quantity 90, last fill 09/22/2017.  If I take over the patient's opiate regimen, will  decrease this is 10 mg 3 times daily as needed and continue to wean the patient down MME/day: 88 mg/day.   Melanie Billow, RN  11/03/2017 10:07 AM  Sign at close encounter Safety precautions to be maintained throughout the outpatient stay will include: orient to surroundings, keep bed in low position, maintain call bell within reach at all times, provide assistance with transfer out of bed and ambulation.    Pharmacokinetics: Liberation and absorption (onset of action): WNL Distribution (time to peak effect): WNL Metabolism and excretion (duration of action): WNL         Pharmacodynamics: Desired effects: Analgesia: Melanie Bowen reports >50% benefit. Functional ability: Patient reports that medication allows her to accomplish basic ADLs Clinically meaningful improvement in function (CMIF): Sustained CMIF goals met Perceived effectiveness: Described as relatively effective, allowing for increase in activities of daily living (ADL) Undesirable effects: Side-effects or Adverse reactions: None reported Monitoring: [place] PMP: Online review of the past 77-monthperiod conducted. Compliant with practice rules and regulations Last UDS on record: No results found for: SUMMARY UDS interpretation: We will obtain today          Medication Assessment Form: Not applicable. Initial evaluation. The patient has not received any medications from our practice initial evaluation for opioid therapy.  Had previously been treating patient with interventional therapy. Treatment compliance: Not applicable. Initial evaluation for opioid therapy Risk Assessment Profile: Aberrant behavior: See prior evaluations. None observed or detected today Comorbid factors increasing risk of overdose: See prior notes. No additional risks detected today Risk of substance use disorder (SUD): Low Opioid Risk Tool - 11/03/17 1016      Family History of Substance Abuse   Alcohol  Negative    Illegal Drugs  Negative    Rx Drugs  Negative      Personal History of Substance Abuse   Alcohol  Negative    Illegal Drugs  Negative    Rx  Drugs  Negative      Psychological Disease   Psychological Disease  Negative    Depression  Negative      Total Score   Opioid Risk Tool Scoring  0    Opioid Risk Interpretation  Low Risk      ORT Scoring interpretation table:  Score <3 = Low Risk for SUD  Score between 4-7 = Moderate Risk for SUD  Score >8 = High Risk for Opioid Abuse    Post-Procedure Assessment  09/12/2017 Procedure: Right C4, C5, C6, C7 medial branch nerve block Pre-procedure pain score:  9/10 Post-procedure pain score: 0/10         Influential Factors: BMI: 49.07 kg/m Intra-procedural challenges: None observed.         Assessment challenges: None detected.              Reported side-effects: None.        Post-procedural adverse reactions or complications: None reported         Sedation: Please see nurses note. When no sedatives are used, the analgesic levels obtained are directly associated to the effectiveness of the local anesthetics. However, when sedation is provided, the level of analgesia obtained during the initial 1 hour following the intervention, is believed to be the result of a combination of factors. These factors may include, but are not limited to: 1. The effectiveness of the local anesthetics used. 2. The effects of the analgesic(s) and/or anxiolytic(s) used. 3. The degree of discomfort experienced by the patient at the time of  the procedure. 4. The patients ability and reliability in recalling and recording the events. 5. The presence and influence of possible secondary gains and/or psychosocial factors. Reported result: Relief experienced during the 1st hour after the procedure: 100 % (Ultra-Short Term Relief)            Interpretative annotation: Clinically appropriate result. Analgesia during this period is likely to be Local Anesthetic and/or IV Sedative (Analgesic/Anxiolytic) related.          Effects of local anesthetic: The analgesic effects attained during this period are directly  associated to the localized infiltration of local anesthetics and therefore cary significant diagnostic value as to the etiological location, or anatomical origin, of the pain. Expected duration of relief is directly dependent on the pharmacodynamics of the local anesthetic used. Long-acting (4-6 hours) anesthetics used.  Reported result: Relief during the next 4 to 6 hour after the procedure: 100 % (Short-Term Relief)            Interpretative annotation: Clinically appropriate result. Analgesia during this period is likely to be Local Anesthetic-related.          Long-term benefit: Defined as the period of time past the expected duration of local anesthetics (1 hour for short-acting and 4-6 hours for long-acting). With the possible exception of prolonged sympathetic blockade from the local anesthetics, benefits during this period are typically attributed to, or associated with, other factors such as analgesic sensory neuropraxia, antiinflammatory effects, or beneficial biochemical changes provided by agents other than the local anesthetics.  Reported result: Extended relief following procedure: 50 %(patient states the relief she received was good and lasted approx 39month. ) (Long-Term Relief)            Interpretative annotation: Clinically appropriate result. Good relief. No permanent benefit expected. Inflammation plays a part in the etiology to the pain.          Current benefits: Defined as reported results that persistent at this point in time.   Analgesia: 50 % Ms. RHortonreports that both, extremity and the axial pain improved with the treatment. Function: Somewhat improved ROM: Somewhat improved Interpretative annotation: Ongoing benefit. Therapeutic benefit observed. Effective therapeutic approach.          Interpretation: Results would suggest a successful therapeutic intervention.                  Plan:  Please see "Plan of Care" for details.                Laboratory Chemistry   Inflammation Markers (CRP: Acute Phase) (ESR: Chronic Phase) No results found for: CRP, ESRSEDRATE, LATICACIDVEN                       Rheumatology Markers Lab Results  Component Value Date   ANA Negative 11/29/2016                Renal Function Markers Lab Results  Component Value Date   BUN 17 03/14/2017   CREATININE 1.06 (H) 03/14/2017   GFRAA >60 03/14/2017   GFRNONAA 58 (L) 03/14/2017                 Hepatic Function Markers Lab Results  Component Value Date   AST 25 03/14/2017   ALT 15 03/14/2017   ALBUMIN 3.4 (L) 03/14/2017   ALKPHOS 97 03/14/2017   LIPASE 31 08/01/2016                 Electrolytes  Lab Results  Component Value Date   NA 137 03/14/2017   K 3.2 (L) 03/14/2017   CL 106 03/14/2017   CALCIUM 8.9 03/14/2017                        Neuropathy Markers Lab Results  Component Value Date   HIV Reactive (A) 11/29/2016                 Bone Pathology Markers No results found for: [place], [id number], [id number], [id number], 25OHVITD1, 25OHVITD2, 25OHVITD3, TESTOFREE, TESTOSTERONE                       Coagulation Parameters Lab Results  Component Value Date   PLT 426 03/14/2017                 Cardiovascular Markers Lab Results  Component Value Date   BNP 193.0 (H) 12/03/2016   CKTOTAL 241 (H) 04/15/2013   CKMB 1.3 04/15/2013   TROPONINI <0.03 03/14/2017   HGB 10.7 (L) 03/14/2017   HCT 33.1 (L) 03/14/2017                 CA Markers No results found for: CEA, CA125, LABCA2               Note: Lab results reviewed.  Recent Diagnostic Imaging Results  DG C-Arm 1-60 Min-No Report Fluoroscopy was utilized by the requesting physician.  No radiographic  interpretation.   Complexity Note: Imaging results reviewed. Results shared with Ms. Kossman, using Layman's terms.                         Meds   Current Outpatient Medications:  .  albuterol (PROVENTIL HFA;VENTOLIN HFA) 108 (90 BASE) MCG/ACT inhaler, Inhale 2 puffs into the lungs  every 6 (six) hours as needed for wheezing or shortness of breath. , Disp: , Rfl:  .  amLODipine (NORVASC) 10 MG tablet, Take 10 mg by mouth daily. , Disp: , Rfl:  .  Azelastine HCl 0.15 % SOLN, Place 1 spray into both nostrils as needed., Disp: , Rfl:  .  clobetasol ointment (TEMOVATE) 2.75 %, Apply 1 application topically as needed., Disp: , Rfl:  .  DEXILANT 60 MG capsule, Take 60 mg by mouth daily. , Disp: , Rfl:  .  Fluticasone-Salmeterol (ADVAIR DISKUS) 500-50 MCG/DOSE AEPB, Inhale 1 puff into the lungs 2 (two) times daily., Disp: 60 each, Rfl: 5 .  GENVOYA 150-150-200-10 MG TABS tablet, Take 1 tablet by mouth daily., Disp: , Rfl:  .  hydrochlorothiazide (HYDRODIURIL) 25 MG tablet, Take 25 mg by mouth daily., Disp: , Rfl:  .  ipratropium-albuterol (DUONEB) 0.5-2.5 (3) MG/3ML SOLN, Inhale 3 mLs into the lungs every 6 (six) hours as needed (for wheezing/shortness of breath). , Disp: , Rfl:  .  losartan (COZAAR) 50 MG tablet, Take 50 mg by mouth daily., Disp: , Rfl:  .  omeprazole (PRILOSEC) 40 MG capsule, Take 40 mg by mouth daily. , Disp: , Rfl:  .  promethazine (PHENERGAN) 25 MG tablet, Take 25 mg by mouth every 6 (six) hours as needed for nausea or vomiting. , Disp: , Rfl:  .  ranitidine (ZANTAC) 150 MG tablet, Take 150 mg by mouth daily., Disp: , Rfl:  .  triamcinolone cream (KENALOG) 0.1 %, Apply 1 application topically 3 times/day as needed-between meals & bedtime., Disp: , Rfl:  .  valACYclovir (VALTREX) 500 MG  tablet, Take 500 mg by mouth 2 (two) times daily., Disp: , Rfl:  .  efavirenz-emtricitabine-tenofovir (ATRIPLA) 600-200-300 MG per tablet, Take 1 tablet by mouth at bedtime., Disp: , Rfl:  .  oxyCODONE (ROXICODONE) 15 MG immediate release tablet, Take 15 mg by mouth every 6 (six) hours as needed for pain., Disp: , Rfl:  .  traZODone (DESYREL) 100 MG tablet, , Disp: , Rfl:   ROS  Constitutional: Denies any fever or chills Gastrointestinal: No reported hemesis, hematochezia,  vomiting, or acute GI distress Musculoskeletal: Denies any acute onset joint swelling, redness, loss of ROM, or weakness Neurological: No reported episodes of acute onset apraxia, aphasia, dysarthria, agnosia, amnesia, paralysis, loss of coordination, or loss of consciousness  Allergies  Ms. Matto is allergic to morphine and related; gabapentin; and zanaflex  [tizanidine].  PFSH  Drug: Ms. Dunlevy  reports that she does not use drugs. Alcohol:  reports that she drinks about 3.0 oz of alcohol per week. Tobacco:  reports that she has been smoking cigarettes.  She has smoked for the past 38.00 years. she has never used smokeless tobacco. Medical:  has a past medical history of Allergic rhinitis, Arthritis, Asthma, Blepharitis, Bronchitis, Cigarette smoker, Claustrophobia, COPD (chronic obstructive pulmonary disease) ([place]), Cough, Depression, Eye irritation, Genital herpes, GERD (gastroesophageal reflux disease), Headache, HIV (human immunodeficiency virus infection) ([place]), Hypertension, Paresthesia of hand, bilateral, and Shortness of breath dyspnea. Surgical: Ms. Notte  has a past surgical history that includes Knee surgery (Bilateral, [phone]); Hand surgery (Bilateral); Tubal ligation; Laparoscopy for ectopic pregnancy; Bunionectomy (Bilateral); Finger fracture (Right); back surgery may 2016; Video bronchoscopy (N/A, 06/30/2015); Diagnostic laparoscopy; Back surgery; Joint replacement (Bilateral); Cardiac catheterization (N/A, 01/12/2016); Cardiac catheterization (N/A, 01/12/2016); and Esophagogastroduodenoscopy (Left, 01/11/2016). Family: family history includes Breast cancer in her sister.  Constitutional Exam  General appearance: Well nourished, well developed, and well hydrated. In no apparent acute distress Vitals:   11/03/17 1006  BP: 140/88  Pulse: 97  Resp: 16  Temp: 98.2 F (36.8 C)  TempSrc: Oral  SpO2: 100%  Weight: (!) 304 lb (137.9 kg)  Height: '5\' 6"'$  (1.676 m)   BMI  Assessment: Estimated body mass index is 49.07 kg/m as calculated from the following:   Height as of this encounter: '5\' 6"'$  (1.676 m).   Weight as of this encounter: 304 lb (137.9 kg).  BMI interpretation table: BMI level Category Range association with higher incidence of chronic pain  <18 kg/m2 Underweight   18.5-24.9 kg/m2 Ideal body weight   25-29.9 kg/m2 Overweight Increased incidence by 20%  30-34.9 kg/m2 Obese (Class I) Increased incidence by 68%  35-39.9 kg/m2 Severe obesity (Class II) Increased incidence by 136%  >40 kg/m2 Extreme obesity (Class III) Increased incidence by 254%   BMI Readings from Last 4 Encounters:  11/03/17 49.07 kg/m  09/12/17 43.58 kg/m  08/24/17 43.58 kg/m  06/06/17 48.42 kg/m   Wt Readings from Last 4 Encounters:  11/03/17 (!) 304 lb (137.9 kg)  09/12/17 270 lb (122.5 kg)  08/24/17 270 lb (122.5 kg)  06/06/17 300 lb (136.1 kg)  Psych/Mental status: Alert, oriented x 3 (person, place, & time)       Eyes: PERLA Respiratory: No evidence of acute respiratory distress  Cervical Spine Area Exam  Skin & Axial Inspection: No masses, redness, edema, swelling, or associated skin lesions Alignment: Symmetrical Functional ROM: Decreased ROM, to the right Stability: No instability detected Muscle Tone/Strength: Functionally intact. No obvious neuro-muscular anomalies detected. Sensory (Neurological): Arthropathic arthralgia Palpation: Complains of  area being tender to palpation Positive provocative maneuver for for bilateral occipital neuralgia and cervical facet disease  Upper Extremity (UE) Exam    Side: Right upper extremity  Side: Left upper extremity  Skin & Extremity Inspection: Skin color, temperature, and hair growth are WNL. No peripheral edema or cyanosis. No masses, redness, swelling, asymmetry, or associated skin lesions. No contractures.  Skin & Extremity Inspection: Skin color, temperature, and hair growth are WNL. No peripheral edema or  cyanosis. No masses, redness, swelling, asymmetry, or associated skin lesions. No contractures.  Functional ROM: Unrestricted ROM          Functional ROM: Unrestricted ROM          Muscle Tone/Strength: Functionally intact. No obvious neuro-muscular anomalies detected.  Muscle Tone/Strength: Functionally intact. No obvious neuro-muscular anomalies detected.  Sensory (Neurological): Unimpaired          Sensory (Neurological): Unimpaired          Palpation: No palpable anomalies              Palpation: No palpable anomalies              Specialized Test(s): Deferred         Specialized Test(s): Deferred          Thoracic Spine Area Exam  Skin & Axial Inspection: No masses, redness, or swelling Alignment: Symmetrical Functional ROM: Unrestricted ROM Stability: No instability detected Muscle Tone/Strength: Functionally intact. No obvious neuro-muscular anomalies detected. Sensory (Neurological): Unimpaired Muscle strength & Tone: No palpable anomalies  Lumbar Spine Area Exam  Skin & Axial Inspection: No masses, redness, or swelling Alignment: Symmetrical Functional ROM: Unrestricted ROM      Stability: No instability detected Muscle Tone/Strength: Functionally intact. No obvious neuro-muscular anomalies detected. Sensory (Neurological): Unimpaired Palpation: No palpable anomalies       Provocative Tests: Lumbar Hyperextension and rotation test: evaluation deferred today       Lumbar Lateral bending test: evaluation deferred today       Patrick's Maneuver: evaluation deferred today                    Gait & Posture Assessment  Ambulation: Unassisted Gait: Relatively normal for age and body habitus Posture: WNL   Lower Extremity Exam    Side: Right lower extremity  Side: Left lower extremity  Skin & Extremity Inspection: Skin color, temperature, and hair growth are WNL. No peripheral edema or cyanosis. No masses, redness, swelling, asymmetry, or associated skin lesions. No contractures.   Skin & Extremity Inspection: Skin color, temperature, and hair growth are WNL. No peripheral edema or cyanosis. No masses, redness, swelling, asymmetry, or associated skin lesions. No contractures.  Functional ROM: Unrestricted ROM          Functional ROM: Unrestricted ROM          Muscle Tone/Strength: Functionally intact. No obvious neuro-muscular anomalies detected.  Muscle Tone/Strength: Functionally intact. No obvious neuro-muscular anomalies detected.  Sensory (Neurological): Unimpaired  Sensory (Neurological): Unimpaired  Palpation: No palpable anomalies  Palpation: No palpable anomalies   Assessment  Primary Diagnosis & Pertinent Problem List: The primary encounter diagnosis was DDD (degenerative disc disease), lumbar. Diagnoses of Degenerative disc disease, lumbar, Lumbar pseudoarthrosis, Cervicalgia, HIV disease ([place]), Neck pain, and Chronic pain syndrome were also pertinent to this visit.  Status Diagnosis  Persistent Persistent Persistent 1. DDD (degenerative disc disease), lumbar   2. Degenerative disc disease, lumbar   3. Lumbar pseudoarthrosis   4. Cervicalgia  5. HIV disease ([place])   6. Neck pain   7. Chronic pain syndrome     General Recommendations: The pain condition that the patient suffers from is best treated with a multidisciplinary approach that involves an increase in physical activity to prevent de-conditioning and worsening of the pain cycle, as well as psychological counseling (formal and/or informal) to address the co-morbid psychological affects of pain. Treatment will often involve judicious use of pain medications and interventional procedures to decrease the pain, allowing the patient to participate in the physical activity that will ultimately produce long-lasting pain reductions. The goal of the multidisciplinary approach is to return the patient to a higher level of overall function and to restore their ability to perform activities of daily  living.  57 year old female with a history of right neck pain that radiates down into her right shoulder secondary to cervical spondylosis, cervical degenerative disc disease, facet arthropathy who presents for follow-up status post right C4, C5, C6, C7 medial branch nerve block.  Patient endorses approximately 50% pain relief of her right neck and shoulder pain symptoms until the last couple of days where she has had worsening pain in her lumbar spine.  I discussed lumbar facet medial branch nerve blocks but the patient is not interested in lumbar injections due to a prior unfavorable experience.  For her acute on chronic pain, we discussed IM Toradol and Norflex and patient is okay with receiving these injections today.  Patient is interested in having me take over her opioid management.  This was previously managed by her orthopedic surgeon.  She was on oxycodone 15 mg 3 times daily as needed, quantity 35-month  Notify the patient that she would have to decrease her total MME to less than 60 and that if she is a candidate for opioid therapy here, her maximum dose would not exceed oxycodone 10 mg 3 times daily as needed.  Patient in agreement with plan.  I will also obtain a basic metabolic panel to evaluate the patient's kidney function and her low potassium level as seen on her last BMP.  Patient will also be sent to pain psychology regarding risk of substance abuse disorder potential.  Plan: -IM Toradol and Norflex today 60 mg respectively  -UDS today, should be positive for oxycodone and its metabolites -Referral to pain psychology which is standard for new patients being considered for chronic opioid therapy -Basic metabolic panel to evaluate kidney function as well as potassium  Future: Repeat right C4, C5, C6, C7 cervical medial branch nerve block.  Plan of Care  Pharmacotherapy (Medications Ordered): Meds ordered this encounter  Medications  . orphenadrine (NORFLEX) injection 60 mg  .  ketorolac (TORADOL) injection 60 mg   Lab-work, procedure(s), and/or referral(s): Orders Placed This Encounter  Procedures  . Compliance Drug Analysis, Ur  . Basic metabolic panel  . Ambulatory referral to Psychology    Pharmacological management options:  Opioid Analgesics: I will not be prescribing any opioids at this time Membrane stabilizer: We have discussed the possibility of optimizing this mode of therapy, if tolerated Muscle relaxant: We have discussed the possibility of a trial NSAID: We have discussed the possibility of a trial Other analgesic(s): To be determined at a later time    Provider-requested follow-up: Return in about 2 weeks (around 11/17/2017) for Medication Management, After Psychological evaluation. Time Note: Greater than 50% of the 25 minute(s) of face-to-face time spent with Ms. RTarr was spent in counseling/coordination of care regarding: the appropriate use of  the pain scale, opioid tolerance, Ms. Kitko primary cause of pain, the treatment plan, treatment alternatives, the risks and possible complications of proposed treatment, the opioid analgesic risks and possible complications, realistic expectations, the goals of pain management (increased in functionality) and the need to bring and keep the BMI below 30.  Future Appointments  Date Time Provider [place]  11/17/2017  8:45 AM Gillis Santa, MD [facility] Ms None    Primary Care Physician: Theotis Burrow, MD Location: [facility] Note by: Gillis Santa, M.D Date: 11/03/2017; Time: 1:09 PM  There are no Patient Instructions on file for this visit.

## 2017-11-03 NOTE — Progress Notes (Signed)
Safety precautions to be maintained throughout the outpatient stay will include: orient to surroundings, keep bed in low position, maintain call bell within reach at all times, provide assistance with transfer out of bed and ambulation.  

## 2017-11-04 LAB — BASIC METABOLIC PANEL
BUN/Creatinine Ratio: 17 (ref 9–23)
BUN: 13 mg/dL (ref 6–24)
CO2: 23 mmol/L (ref 20–29)
Calcium: 9.3 mg/dL (ref 8.7–10.2)
Chloride: 102 mmol/L (ref 96–106)
Creatinine, Ser: 0.75 mg/dL (ref 0.57–1.00)
GFR calc Af Amer: 104 mL/min/{1.73_m2} (ref >59)
GFR calc non Af Amer: 90 mL/min/{1.73_m2} (ref >59)
Glucose: 81 mg/dL (ref 65–99)
Potassium: 3.9 mmol/L (ref 3.5–5.2)
Sodium: 138 mmol/L (ref 134–144)

## 2017-11-07 ENCOUNTER — Other Ambulatory Visit: Payer: Self-pay

## 2017-11-07 ENCOUNTER — Encounter: Payer: Self-pay | Admitting: Student in an Organized Health Care Education/Training Program

## 2017-11-07 ENCOUNTER — Ambulatory Visit
Payer: Medicare Other | Attending: Student in an Organized Health Care Education/Training Program | Admitting: Student in an Organized Health Care Education/Training Program

## 2017-11-07 ENCOUNTER — Telehealth: Payer: Self-pay | Admitting: Student in an Organized Health Care Education/Training Program

## 2017-11-07 VITALS — BP 136/77 | HR 93 | Resp 18 | Ht 66.0 in | Wt 304.0 lb

## 2017-11-07 DIAGNOSIS — G894 Chronic pain syndrome: Secondary | ICD-10-CM | POA: Insufficient documentation

## 2017-11-07 DIAGNOSIS — B20 Human immunodeficiency virus [HIV] disease: Secondary | ICD-10-CM | POA: Diagnosis not present

## 2017-11-07 DIAGNOSIS — S32009K Unspecified fracture of unspecified lumbar vertebra, subsequent encounter for fracture with nonunion: Secondary | ICD-10-CM

## 2017-11-07 DIAGNOSIS — Z79891 Long term (current) use of opiate analgesic: Secondary | ICD-10-CM | POA: Diagnosis not present

## 2017-11-07 DIAGNOSIS — M542 Cervicalgia: Secondary | ICD-10-CM | POA: Diagnosis not present

## 2017-11-07 DIAGNOSIS — M5136 Other intervertebral disc degeneration, lumbar region: Secondary | ICD-10-CM | POA: Diagnosis not present

## 2017-11-07 DIAGNOSIS — F17200 Nicotine dependence, unspecified, uncomplicated: Secondary | ICD-10-CM | POA: Insufficient documentation

## 2017-11-07 DIAGNOSIS — Z888 Allergy status to other drugs, medicaments and biological substances status: Secondary | ICD-10-CM | POA: Diagnosis not present

## 2017-11-07 DIAGNOSIS — M960 Pseudarthrosis after fusion or arthrodesis: Secondary | ICD-10-CM | POA: Insufficient documentation

## 2017-11-07 DIAGNOSIS — Z885 Allergy status to narcotic agent status: Secondary | ICD-10-CM | POA: Diagnosis not present

## 2017-11-07 DIAGNOSIS — Z79899 Other long term (current) drug therapy: Secondary | ICD-10-CM | POA: Diagnosis not present

## 2017-11-07 DIAGNOSIS — Z9889 Other specified postprocedural states: Secondary | ICD-10-CM | POA: Insufficient documentation

## 2017-11-07 MED ORDER — OXYCODONE HCL 10 MG PO TABS: 10.0000 mg | ORAL_TABLET | Freq: Three times a day (TID) | ORAL | 0 refills | Status: DC | PRN

## 2017-11-07 NOTE — Telephone Encounter (Signed)
[facility] and we have received report. States the shot has worn off and she is out of meds. Wants to come in this week instead of scheduled appt of 11-17-17. Is this ok Dr. Holley Raring

## 2017-11-07 NOTE — Progress Notes (Signed)
Safety precautions to be maintained throughout the outpatient stay will include: orient to surroundings, keep bed in low position, maintain call bell within reach at all times, provide assistance with transfer out of bed and ambulation.  

## 2017-11-07 NOTE — Progress Notes (Signed)
Dictation #1 [id number]  [id number] Patient's Name: Melanie Bowen  MRN: [id number]  Referring Provider: Theotis Burrow*  DOB: [birth year]  PCP: Theotis Burrow, MD  DOS: 11/07/2017  Note by: Gillis Santa, MD  Service setting: Ambulatory outpatient  Specialty: Interventional Pain Management  Location: [facility] (AMB) Pain Management Facility    Patient type: Established   Primary Reason(s) for Visit: Encounter for prescription drug management. (Level of risk: moderate)  CC: Back Pain (lower)  HPI  [name] is a 57 y.o. year old, female patient, who comes today for a medication management evaluation. She has Asthma, chronic; DDD (degenerative disc disease), lumbar; Degenerative disc disease, lumbar; Cough; Chronic cough; Lumbar pseudoarthrosis; GI bleed; GERD (gastroesophageal reflux disease); HTN (hypertension); Depression; HIV (human immunodeficiency virus infection) ([place]); Anemia; Symptomatic anemia; Lower GI bleed; Tobacco abuse counseling; Allergic rhinitis; Community acquired pneumonia; and Pneumonia on their problem list. Her primarily concern today is the Back Pain (lower)  Pain Assessment: Location: Lower Back Radiating: buttocks bilateral and down back of to bottom of num Onset: More than a month ago Duration: Chronic pain Quality: Aching, Numbness, Discomfort, Sharp Severity: 9 /10 (self-reported pain score)  Note: Reported level is inconsistent with clinical observations. Clinically the patient looks like a 3/10             When using our objective Pain Scale, levels between 6 and 10/10 are said to belong in an emergency room, as it progressively worsens from a 6/10, described as severely limiting, requiring emergency care not usually available at an outpatient pain management facility. At a 6/10 level, communication becomes difficult and requires great effort. Assistance to reach the emergency department may be required. Facial flushing and profuse sweating along  with potentially dangerous increases in heart rate and blood pressure will be evident. Effect on ADL: hard to get comfortable, , activity increases pain Timing: Constant Modifying factors: meds  [name] was last scheduled for an appointment on 11/07/2017 for medication management. During today's appointment we reviewed [name]'s chronic pain status, as well as her outpatient medication regimen.  The patient  reports that she does not use drugs. Her body mass index is 49.07 kg/m.  Further details on both, my assessment(s), as well as the proposed treatment plan, please see below.  Controlled Substance Pharmacotherapy Assessment REMS (Risk Evaluation and Mitigation Strategy)  Analgesic: Patient previously on oxycodone 15 mg 3 times a day.  Patient has seen pain psychology, low risk for SUD, will reduce dose to oxycodone 10 mg 3 times daily. MME/day: 45 mg/day.  Ignatius Specking, RN  11/07/2017 11:20 AM  Sign at close encounter Safety precautions to be maintained throughout the outpatient stay will include: orient to surroundings, keep bed in low position, maintain call bell within reach at all times, provide assistance with transfer out of bed and ambulation.    Pharmacokinetics: Liberation and absorption (onset of action): WNL Distribution (time to peak effect): WNL Metabolism and excretion (duration of action): WNL         Pharmacodynamics: Desired effects: Analgesia: [name] reports >50% benefit. Functional ability: Patient reports that medication allows her to accomplish basic ADLs Clinically meaningful improvement in function (CMIF): Sustained CMIF goals met Perceived effectiveness: Described as relatively effective, allowing for increase in activities of daily living (ADL) Undesirable effects: Side-effects or Adverse reactions: None reported Monitoring: [place] PMP: Online review of the past 35-monthperiod conducted. Compliant with practice rules and regulations Last UDS  on record: No results found for:  SUMMARY UDS interpretation: awaiting results from UDS collected last week          Medication Assessment Form: Reviewed. Patient indicates being compliant with therapy Treatment compliance: Compliant Risk Assessment Profile: Aberrant behavior: See prior evaluations. None observed or detected today Comorbid factors increasing risk of overdose: See prior notes. No additional risks detected today Risk of substance use disorder (SUD): Low Opioid Risk Tool - 11/07/17 1126      Family History of Substance Abuse   Alcohol  Negative    Illegal Drugs  Negative    Rx Drugs  Negative      Personal History of Substance Abuse   Alcohol  Negative    Illegal Drugs  Negative    Rx Drugs  Negative      History of Preadolescent Sexual Abuse   History of Preadolescent Sexual Abuse  Negative or Female      Psychological Disease   Psychological Disease  Negative    Depression  Negative      Total Score   Opioid Risk Tool Scoring  0    Opioid Risk Interpretation  Low Risk      ORT Scoring interpretation table:  Score <3 = Low Risk for SUD  Score between 4-7 = Moderate Risk for SUD  Score >8 = High Risk for Opioid Abuse   Risk Mitigation Strategies:  Patient Counseling: Completed today. Counseling provided to patient as per "Patient Counseling Document". Document signed by patient, attesting to counseling and understanding Patient-Prescriber Agreement (PPA): Obtained today  Notification to other healthcare providers: Written and sent today  Pharmacologic Plan: Oxycodone 10 mg TID prn, #90/month             Laboratory Chemistry  Inflammation Markers (CRP: Acute Phase) (ESR: Chronic Phase) No results found for: CRP, ESRSEDRATE, LATICACIDVEN                       Rheumatology Markers Lab Results  Component Value Date   ANA Negative 11/29/2016                Renal Function Markers Lab Results  Component Value Date   BUN 13 11/03/2017   CREATININE 0.75  11/03/2017   GFRAA 104 11/03/2017   GFRNONAA 90 11/03/2017                 Hepatic Function Markers Lab Results  Component Value Date   AST 25 03/14/2017   ALT 15 03/14/2017   ALBUMIN 3.4 (L) 03/14/2017   ALKPHOS 97 03/14/2017   LIPASE 31 08/01/2016                 Electrolytes Lab Results  Component Value Date   NA 138 11/03/2017   K 3.9 11/03/2017   CL 102 11/03/2017   CALCIUM 9.3 11/03/2017                        Neuropathy Markers Lab Results  Component Value Date   HIV Reactive (A) 11/29/2016                 Bone Pathology Markers No results found for: [place], [id number], [id number], [id number], 25OHVITD1, 25OHVITD2, 25OHVITD3, TESTOFREE, TESTOSTERONE                       Coagulation Parameters Lab Results  Component Value Date   PLT 426 03/14/2017  Cardiovascular Markers Lab Results  Component Value Date   BNP 193.0 (H) 12/03/2016   CKTOTAL 241 (H) 04/15/2013   CKMB 1.3 04/15/2013   TROPONINI <0.03 03/14/2017   HGB 10.7 (L) 03/14/2017   HCT 33.1 (L) 03/14/2017                 CA Markers No results found for: CEA, CA125, LABCA2               Note: Lab results reviewed.  Recent Diagnostic Imaging Results  DG C-Arm 1-60 Min-No Report Fluoroscopy was utilized by the requesting physician.  No radiographic  interpretation.   Complexity Note: Imaging results reviewed. Results shared with Ms. Jeffrey, using Layman's terms.                         Meds   Current Outpatient Medications:  .  albuterol (PROVENTIL HFA;VENTOLIN HFA) 108 (90 BASE) MCG/ACT inhaler, Inhale 2 puffs into the lungs every 6 (six) hours as needed for wheezing or shortness of breath. , Disp: , Rfl:  .  amLODipine (NORVASC) 10 MG tablet, Take 10 mg by mouth daily. , Disp: , Rfl:  .  Azelastine HCl 0.15 % SOLN, Place 1 spray into both nostrils as needed., Disp: , Rfl:  .  clobetasol ointment (TEMOVATE) 6.26 %, Apply 1 application topically as needed., Disp: , Rfl:  .   DEXILANT 60 MG capsule, Take 60 mg by mouth daily. , Disp: , Rfl:  .  efavirenz-emtricitabine-tenofovir (ATRIPLA) 600-200-300 MG per tablet, Take 1 tablet by mouth at bedtime., Disp: , Rfl:  .  Fluticasone-Salmeterol (ADVAIR DISKUS) 500-50 MCG/DOSE AEPB, Inhale 1 puff into the lungs 2 (two) times daily., Disp: 60 each, Rfl: 5 .  GENVOYA 150-150-200-10 MG TABS tablet, Take 1 tablet by mouth daily., Disp: , Rfl:  .  hydrochlorothiazide (HYDRODIURIL) 25 MG tablet, Take 25 mg by mouth daily., Disp: , Rfl:  .  ipratropium-albuterol (DUONEB) 0.5-2.5 (3) MG/3ML SOLN, Inhale 3 mLs into the lungs every 6 (six) hours as needed (for wheezing/shortness of breath). , Disp: , Rfl:  .  losartan (COZAAR) 50 MG tablet, Take 50 mg by mouth daily., Disp: , Rfl:  .  omeprazole (PRILOSEC) 40 MG capsule, Take 40 mg by mouth daily. , Disp: , Rfl:  .  promethazine (PHENERGAN) 25 MG tablet, Take 25 mg by mouth every 6 (six) hours as needed for nausea or vomiting. , Disp: , Rfl:  .  ranitidine (ZANTAC) 150 MG tablet, Take 150 mg by mouth daily., Disp: , Rfl:  .  traZODone (DESYREL) 100 MG tablet, , Disp: , Rfl:  .  triamcinolone cream (KENALOG) 0.1 %, Apply 1 application topically 3 times/day as needed-between meals & bedtime., Disp: , Rfl:  .  valACYclovir (VALTREX) 500 MG tablet, Take 500 mg by mouth 2 (two) times daily., Disp: , Rfl:  .  Oxycodone HCl 10 MG TABS, Take 1 tablet (10 mg total) by mouth 3 (three) times daily as needed. For chronic pain To last for 30 days from fill date, Disp: 90 tablet, Rfl: 0  ROS  Constitutional: Denies any fever or chills Gastrointestinal: No reported hemesis, hematochezia, vomiting, or acute GI distress Musculoskeletal: Denies any acute onset joint swelling, redness, loss of ROM, or weakness Neurological: No reported episodes of acute onset apraxia, aphasia, dysarthria, agnosia, amnesia, paralysis, loss of coordination, or loss of consciousness  Allergies  Ms. Robotham is allergic to  morphine and related; gabapentin;  and zanaflex  [tizanidine].  PFSH  Drug: Ms. Wenk  reports that she does not use drugs. Alcohol:  reports that she drinks about 3.0 oz of alcohol per week. Tobacco:  reports that she has been smoking cigarettes.  She has smoked for the past 38.00 years. she has never used smokeless tobacco. Medical:  has a past medical history of Allergic rhinitis, Arthritis, Asthma, Blepharitis, Bronchitis, Cigarette smoker, Claustrophobia, COPD (chronic obstructive pulmonary disease) ([place]), Cough, Depression, Eye irritation, Genital herpes, GERD (gastroesophageal reflux disease), Headache, HIV (human immunodeficiency virus infection) ([place]), Hypertension, Paresthesia of hand, bilateral, and Shortness of breath dyspnea. Surgical: Ms. Trautner  has a past surgical history that includes Knee surgery (Bilateral, [phone]); Hand surgery (Bilateral); Tubal ligation; Laparoscopy for ectopic pregnancy; Bunionectomy (Bilateral); Finger fracture (Right); back surgery may 2016; Video bronchoscopy (N/A, 06/30/2015); Diagnostic laparoscopy; Back surgery; Joint replacement (Bilateral); Cardiac catheterization (N/A, 01/12/2016); Cardiac catheterization (N/A, 01/12/2016); and Esophagogastroduodenoscopy (Left, 01/11/2016). Family: family history includes Breast cancer in her sister.  Constitutional Exam  General appearance: Well nourished, well developed, and well hydrated. In no apparent acute distress Vitals:   11/07/17 1118  BP: 136/77  Pulse: 93  Resp: 18  SpO2: 98%  Weight: (!) 304 lb (137.9 kg)  Height: _0  (1.676 m)   BMI Assessment: Estimated body mass index is 49.07 kg/m as calculated from the following:   Height as of this encounter: _1  (1.676 m).   Weight as of this encounter: 304 lb (137.9 kg).  BMI interpretation table: BMI level Category Range association with higher incidence of chronic pain  <18 kg/m2 Underweight   18.5-24.9 kg/m2 Ideal body weight   25-29.9 kg/m2  Overweight Increased incidence by 20%  30-34.9 kg/m2 Obese (Class I) Increased incidence by 68%  35-39.9 kg/m2 Severe obesity (Class II) Increased incidence by 136%  >40 kg/m2 Extreme obesity (Class III) Increased incidence by 254%   BMI Readings from Last 4 Encounters:  11/07/17 49.07 kg/m  11/03/17 49.07 kg/m  09/12/17 43.58 kg/m  08/24/17 43.58 kg/m   Wt Readings from Last 4 Encounters:  11/07/17 (!) 304 lb (137.9 kg)  11/03/17 (!) 304 lb (137.9 kg)  09/12/17 270 lb (122.5 kg)  08/24/17 270 lb (122.5 kg)  Psych/Mental status: Alert, oriented x 3 (person, place, & time)       Eyes: PERLA Respiratory: No evidence of acute respiratory distress  Cervical Spine Area Exam  Skin & Axial Inspection: No masses, redness, edema, swelling, or associated skin lesions Alignment: Symmetrical Functional ROM: Decreased ROM, bilaterally Stability: No instability detected Muscle Tone/Strength: Functionally intact. No obvious neuro-muscular anomalies detected. Sensory (Neurological): Arthropathic arthralgia Palpation: Complains of area being tender to palpation Positive provocative maneuver for for cervical facet disease  Upper Extremity (UE) Exam    Side: Right upper extremity  Side: Left upper extremity  Skin & Extremity Inspection: Skin color, temperature, and hair growth are WNL. No peripheral edema or cyanosis. No masses, redness, swelling, asymmetry, or associated skin lesions. No contractures.  Skin & Extremity Inspection: Skin color, temperature, and hair growth are WNL. No peripheral edema or cyanosis. No masses, redness, swelling, asymmetry, or associated skin lesions. No contractures.  Functional ROM: Unrestricted ROM          Functional ROM: Unrestricted ROM          Muscle Tone/Strength: Functionally intact. No obvious neuro-muscular anomalies detected.  Muscle Tone/Strength: Functionally intact. No obvious neuro-muscular anomalies detected.  Sensory (Neurological): Unimpaired  Sensory (Neurological): Unimpaired          Palpation: No palpable anomalies              Palpation: No palpable anomalies              Specialized Test(s): Deferred         Specialized Test(s): Deferred          Thoracic Spine Area Exam  Skin & Axial Inspection: No masses, redness, or swelling Alignment: Symmetrical Functional ROM: Unrestricted ROM Stability: No instability detected Muscle Tone/Strength: Functionally intact. No obvious neuro-muscular anomalies detected. Sensory (Neurological): Unimpaired Muscle strength & Tone: No palpable anomalies  Lumbar Spine Area Exam  Skin & Axial Inspection: No masses, redness, or swelling Alignment: Symmetrical Functional ROM: Diminished ROM, bilaterally Stability: No instability detected Muscle Tone/Strength: Functionally intact. No obvious neuro-muscular anomalies detected. Sensory (Neurological): Articular pain pattern Palpation: No palpable anomalies       Provocative Tests: Lumbar Hyperextension and rotation test: Positive bilaterally for facet joint pain. Lumbar Lateral bending test: Positive ipsilateral radicular pain, bilaterally. Positive for bilateral foraminal stenosis. Patrick's Maneuver: evaluation deferred today                    Gait & Posture Assessment  Ambulation: Unassisted Gait: Relatively normal for age and body habitus Posture: WNL   Lower Extremity Exam    Side: Right lower extremity  Side: Left lower extremity  Skin & Extremity Inspection: Skin color, temperature, and hair growth are WNL. No peripheral edema or cyanosis. No masses, redness, swelling, asymmetry, or associated skin lesions. No contractures.  Skin & Extremity Inspection: Skin color, temperature, and hair growth are WNL. No peripheral edema or cyanosis. No masses, redness, swelling, asymmetry, or associated skin lesions. No contractures.  Functional ROM: Unrestricted ROM          Functional ROM: Unrestricted ROM          Muscle Tone/Strength:  Functionally intact. No obvious neuro-muscular anomalies detected.  Muscle Tone/Strength: Functionally intact. No obvious neuro-muscular anomalies detected.  Sensory (Neurological): Unimpaired  Sensory (Neurological): Unimpaired  Palpation: No palpable anomalies  Palpation: No palpable anomalies   Assessment  Primary Diagnosis & Pertinent Problem List: The primary encounter diagnosis was DDD (degenerative disc disease), lumbar. Diagnoses of Degenerative disc disease, lumbar, Lumbar pseudoarthrosis, Cervicalgia, HIV disease ([place]), Neck pain, and Chronic pain syndrome were also pertinent to this visit.  Status Diagnosis  Persistent Persistent Persistent 1. DDD (degenerative disc disease), lumbar   2. Degenerative disc disease, lumbar   3. Lumbar pseudoarthrosis   4. Cervicalgia   5. HIV disease ([place])   6. Neck pain   7. Chronic pain syndrome     General Recommendations: The pain condition that the patient suffers from is best treated with a multidisciplinary approach that involves an increase in physical activity to prevent de-conditioning and worsening of the pain cycle, as well as psychological counseling (formal and/or informal) to address the co-morbid psychological affects of pain. Treatment will often involve judicious use of pain medications and interventional procedures to decrease the pain, allowing the patient to participate in the physical activity that will ultimately produce long-lasting pain reductions. The goal of the multidisciplinary approach is to return the patient to a higher level of overall function and to restore their ability to perform activities of daily living.   57 year old female with a history of right cervicalgia secondary to cervical spondylosis and cervical degenerative disc disease as well as axial low back pain  with history of lumbar spine surgery who presents after seeing pain psychology.  Patient is deemed low risk for substance abuse disorder.  We are  continuing to wait on her urine drug screen that was completed last week.  She has been receiving oxycodone 15 mg 3 times daily as needed by her neurosurgeon in the past, we will decrease her dose to 10 mg 3 times daily as needed which equals MME 45.  Patient will sign opioid contract.  Plan: -Prescription for oxycodone 10 mg 3 times daily as needed, quantity 90 for 1 month. -Follow-up on UDS that was obtained last week. -Follow-up in 1 month for medication refill.  Can provide patient with 107-monthrefills at that time (so long as UDS appropriate) and follow up with [employer]thereafter.  Plan of Care  Pharmacotherapy (Medications Ordered): Meds ordered this encounter  Medications  . DISCONTD: Oxycodone HCl 10 MG TABS    Sig: Take 1 tablet (10 mg total) by mouth 3 (three) times daily as needed. For chronic pain To last for 30 days from fill date    Dispense:  120 tablet    Refill:  0    Do not place this medication, or any other prescription from our practice, on "Automatic Refill". Patient may have prescription filled one day early if pharmacy is closed on scheduled refill date.  . Oxycodone HCl 10 MG TABS    Sig: Take 1 tablet (10 mg total) by mouth 3 (three) times daily as needed. For chronic pain To last for 30 days from fill date    Dispense:  90 tablet    Refill:  0    Do not place this medication, or any other prescription from our practice, on "Automatic Refill". Patient may have prescription filled one day early if pharmacy is closed on scheduled refill date.   Time Note: Greater than 50% of the 25 minute(s) of face-to-face time spent with Ms. RRoehm was spent in counseling/coordination of care regarding: the appropriate use of the pain scale, opioid tolerance, Ms. RBrazielprimary cause of pain, the appropriate use of her medications, the goals of pain management (increased in functionality), the medication agreement and the patient's responsibilities when it comes to  controlled substances.  Provider-requested follow-up: Return in about 4 weeks (around 12/05/2017) for Medication Management.  Future Appointments  Date Time Provider [place] 12/05/2017  1:45 PM [facility]ill[facility]    Primary Care Physician: [facility]Theotis Burrow MD [facility] Note by: BGillis Santa M.D Date: 11/07/2017; Time: 12:05 PM  There are no Patient Instructions on file for this visit.

## 2017-11-08 LAB — COMPLIANCE DRUG ANALYSIS, UR

## 2017-11-09 ENCOUNTER — Encounter: Payer: Self-pay | Admitting: Student in an Organized Health Care Education/Training Program

## 2017-11-16 ENCOUNTER — Telehealth: Payer: Self-pay | Admitting: *Deleted

## 2017-11-16 NOTE — Telephone Encounter (Signed)
Attempted to call patient, line busy. °

## 2017-11-17 ENCOUNTER — Encounter: Payer: Medicare Other | Admitting: Student in an Organized Health Care Education/Training Program

## 2017-11-17 NOTE — Telephone Encounter (Signed)
Spoke with patient, she prefers to discuss this at her appointment.

## 2017-11-17 NOTE — Telephone Encounter (Signed)
Attempted to call patient, no answer, no voice mail

## 2017-12-05 ENCOUNTER — Encounter: Payer: Self-pay | Admitting: Student in an Organized Health Care Education/Training Program

## 2017-12-05 ENCOUNTER — Ambulatory Visit
Payer: Medicare Other | Attending: Student in an Organized Health Care Education/Training Program | Admitting: Student in an Organized Health Care Education/Training Program

## 2017-12-05 VITALS — BP 129/76 | HR 82 | Temp 98.4°F | Resp 16 | Ht 66.0 in | Wt 304.0 lb

## 2017-12-05 DIAGNOSIS — I10 Essential (primary) hypertension: Secondary | ICD-10-CM | POA: Diagnosis not present

## 2017-12-05 DIAGNOSIS — Z5181 Encounter for therapeutic drug level monitoring: Secondary | ICD-10-CM | POA: Diagnosis present

## 2017-12-05 DIAGNOSIS — M542 Cervicalgia: Secondary | ICD-10-CM

## 2017-12-05 DIAGNOSIS — S32009K Unspecified fracture of unspecified lumbar vertebra, subsequent encounter for fracture with nonunion: Secondary | ICD-10-CM

## 2017-12-05 DIAGNOSIS — F329 Major depressive disorder, single episode, unspecified: Secondary | ICD-10-CM | POA: Insufficient documentation

## 2017-12-05 DIAGNOSIS — B20 Human immunodeficiency virus [HIV] disease: Secondary | ICD-10-CM | POA: Diagnosis not present

## 2017-12-05 DIAGNOSIS — M79601 Pain in right arm: Secondary | ICD-10-CM | POA: Insufficient documentation

## 2017-12-05 DIAGNOSIS — Z888 Allergy status to other drugs, medicaments and biological substances status: Secondary | ICD-10-CM | POA: Insufficient documentation

## 2017-12-05 DIAGNOSIS — D649 Anemia, unspecified: Secondary | ICD-10-CM | POA: Diagnosis not present

## 2017-12-05 DIAGNOSIS — J440 Chronic obstructive pulmonary disease with acute lower respiratory infection: Secondary | ICD-10-CM | POA: Diagnosis not present

## 2017-12-05 DIAGNOSIS — M5136 Other intervertebral disc degeneration, lumbar region: Secondary | ICD-10-CM | POA: Diagnosis not present

## 2017-12-05 DIAGNOSIS — F17210 Nicotine dependence, cigarettes, uncomplicated: Secondary | ICD-10-CM | POA: Insufficient documentation

## 2017-12-05 DIAGNOSIS — Z765 Malingerer [conscious simulation]: Secondary | ICD-10-CM | POA: Diagnosis not present

## 2017-12-05 DIAGNOSIS — Z91148 Patient's other noncompliance with medication regimen for other reason: Secondary | ICD-10-CM | POA: Insufficient documentation

## 2017-12-05 DIAGNOSIS — G8929 Other chronic pain: Secondary | ICD-10-CM | POA: Insufficient documentation

## 2017-12-05 DIAGNOSIS — Z885 Allergy status to narcotic agent status: Secondary | ICD-10-CM | POA: Insufficient documentation

## 2017-12-05 DIAGNOSIS — Z9114 Patient's other noncompliance with medication regimen: Secondary | ICD-10-CM

## 2017-12-05 DIAGNOSIS — R825 Elevated urine levels of drugs, medicaments and biological substances: Secondary | ICD-10-CM

## 2017-12-05 DIAGNOSIS — Z79899 Other long term (current) drug therapy: Secondary | ICD-10-CM | POA: Insufficient documentation

## 2017-12-05 DIAGNOSIS — K219 Gastro-esophageal reflux disease without esophagitis: Secondary | ICD-10-CM | POA: Diagnosis not present

## 2017-12-05 NOTE — Progress Notes (Signed)
Patient's Name: Melanie Bowen  MRN: [id number]  Referring Provider: Theotis Burrow*  DOB: December 21, 1960  PCP: Theotis Burrow, MD  DOS: 12/05/2017  Note by: Gillis Santa, MD  Service setting: Ambulatory outpatient  Specialty: Interventional Pain Management  Location: [facility] (AMB) Pain Management Facility    Patient type: Established   Primary Reason(s) for Visit: Encounter for prescription drug management. (Level of risk: moderate)  CC: Back Pain (lower bilateral); Neck Pain (right); and Arm Pain (right)  HPI  Melanie Bowen is a 57 y.o. year old, female patient, who comes today for a medication management evaluation. She has Asthma, chronic; DDD (degenerative disc disease), lumbar; Degenerative disc disease, lumbar; Cough; Chronic cough; Lumbar pseudoarthrosis; GI bleed; GERD (gastroesophageal reflux disease); HTN (hypertension); Depression; HIV (human immunodeficiency virus infection) ([place]); Anemia; Symptomatic anemia; Lower GI bleed; Tobacco abuse counseling; Allergic rhinitis; Community acquired pneumonia; Pneumonia; Violation of controlled substance agreement; Positive urine drug screen (+cocaine); Cervicalgia; and Drug-seeking behavior on their problem list. Her primarily concern today is the Back Pain (lower bilateral); Neck Pain (right); and Arm Pain (right)  Pain Assessment: Location: Lower, Left, Right Back(neck) Radiating: back pain goes into both legs and neck pain radiates down the right arm Onset: More than a month ago Duration: Chronic pain Quality: Aching, Numbness, Discomfort, Sharp Severity: 8 /10 (self-reported pain score)  Note: Clear symptom exaggeration. Reported level of pain is not compatible with clinical observations. Clinically the patient looks like a 2/10 A 2/10 is viewed as "Mild to Moderate" and described as noticeable and distracting. Impossible to hide from other people. More frequent flare-ups. Still possible to adapt and function close to normal. It can  be very annoying and may have occasional stronger flare-ups. With discipline, patients may get used to it and adapt.       When using our objective Pain Scale, levels between 6 and 10/10 are said to belong in an emergency room, as it progressively worsens from a 6/10, described as severely limiting, requiring emergency care not usually available at an outpatient pain management facility. At a 6/10 level, communication becomes difficult and requires great effort. Assistance to reach the emergency department may be required. Facial flushing and profuse sweating along with potentially dangerous increases in heart rate and blood pressure will be evident. Effect on ADL: medications allow her to get around better  Timing: Constant Modifying factors: medications  Melanie Bowen was last scheduled for an appointment on 11/07/2017 for medication management. During today's appointment we reviewed Melanie Bowen's chronic pain status, as well as her outpatient medication regimen.  The patient  reports that she does not use drugs. However UDS + cocaine.  Letter sent on 3/13 with UDS results and notification that she has violated her pain contract. No longer candidate for opioid therapy.  Her body mass index is 49.07 kg/m.  Further details on both, my assessment(s), as well as the proposed treatment plan, please see below.  Controlled Substance Pharmacotherapy Assessment REMS (Risk Evaluation and Mitigation Strategy)  Analgesic: Oxycodone 10 mg TID prn MME/day: 45 mg/day.  [name], RN  12/05/2017  1:57 PM  Sign at close encounter Nursing Pain Medication Assessment:  Safety precautions to be maintained throughout the outpatient stay will include: orient to surroundings, keep bed in low position, maintain call bell within reach at all times, provide assistance with transfer out of bed and ambulation.  Medication Inspection Compliance: Pill count conducted under aseptic conditions, in front of the patient.  Neither the pills nor  the bottle was removed from the patient's sight at any time. Once count was completed pills were immediately returned to the patient in their original bottle.  Medication: Oxycodone IR Pill/Patch Count: 3 of 90 pills remain Pill/Patch Appearance: Markings consistent with prescribed medication Bottle Appearance: Standard pharmacy container. Clearly labeled. Filled Date: 03 / 11 / 2019 Last Medication intake:  Today   Pharmacokinetics: Liberation and absorption (onset of action): WNL Distribution (time to peak effect): WNL Metabolism and excretion (duration of action): WNL         Pharmacodynamics: Desired effects: Analgesia: Melanie Bowen reports >50% benefit. Functional ability: Patient reports that medication allows her to accomplish basic ADLs Clinically meaningful improvement in function (CMIF): Sustained CMIF goals met Perceived effectiveness: Described as relatively effective, allowing for increase in activities of daily living (ADL) Undesirable effects: Side-effects or Adverse reactions: None reported Monitoring: [place] PMP: Online review of the past 71-monthperiod conducted. Compliant with practice rules and regulations Last UDS on record: Summary  Date Value Ref Range Status  11/03/2017 FINAL  Final    Comment:    ==================================================================== TOXASSURE COMP DRUG ANALYSIS,UR ==================================================================== Test                             Result       Flag       Units Drug Present and Declared for Prescription Verification   Promethazine                   PRESENT      EXPECTED Drug Present not Declared for Prescription Verification   Cocaine                        60           UNEXPECTED ng/mg creat   Benzoylecgonine                >5208        UNEXPECTED ng/mg creat    Source of cocaine is most commonly illicit, but cocaine is    present in some topical anesthetic solutions.  Benzoylecgonine is    an expected metabolite of cocaine.   Acetaminophen                  PRESENT      UNEXPECTED   Hydroxyzine                    PRESENT      UNEXPECTED   Dextromethorphan               PRESENT      UNEXPECTED   Dextrorphan/Levorphanol        PRESENT      UNEXPECTED    Dextrorphan is an expected metabolite of dextromethorphan, an    over-the-counter or prescription cough suppressant. Dextrorphan    cannot be distinguished from the scheduled prescription    medication levorphanol by the method used for analysis. Drug Absent but Declared for Prescription Verification   Oxycodone                      Not Detected UNEXPECTED ng/mg creat   Trazodone                      Not Detected UNEXPECTED ==================================================================== Test  Result    Flag   Units      Ref Range   Creatinine              96               mg/dL      >=20 ==================================================================== Declared Medications:  The flagging and interpretation on this report are based on the  following declared medications.  Unexpected results may arise from  inaccuracies in the declared medications.  **Note: The testing scope of this panel includes these medications:  Oxycodone  Promethazine  Trazodone  **Note: The testing scope of this panel does not include following  reported medications:  Albuterol  Albuterol (Ipratropium-Albuterol)  Amlodipine  Azelastine  Clobetasol  Cobicistat (Genvoya)  Dexlansoprazole  Efavirenz (Atripla)  Elvitegravir (Genvoya)  Emtricitabine (Atripla)  Emtricitabine (Genvoya)  Fluticasone (Advair)  Hydrochlorothiazide (HCTZ)  Ipratropium (Ipratropium-Albuterol)  Losartan (Losartan Potassium)  Omeprazole  Ranitidine  Salmeterol (Advair)  Tenofovir (Atripla)  Tenofovir (Genvoya)  Triamcinolone acetonide   Valacyclovir ==================================================================== For clinical consultation, please call [phone]. ====================================================================    UDS interpretation: Unexpected findings: Undeclared illicit substance detected + cocaine Medication Assessment Form: Reviewed. Abnormalities discussed Treatment compliance: Non-compliant Risk Assessment Profile: Aberrant behavior: claims that "nothing else works", diminished ability to recognize a problem with one's behavior or use of the medication, drug seeking behavior, dysfunctional emotional process, use of illicit substances and See prior evaluations. None observed or detected today Comorbid factors increasing risk of overdose: history of non-compliance with medical advice, history of substance abuse and history of substance use disorder Risk of substance use disorder (SUD): Very High Opioid Risk Tool - 11/07/17 1126      Family History of Substance Abuse   Alcohol  Negative    Illegal Drugs  Negative    Rx Drugs  Negative      Personal History of Substance Abuse   Alcohol  Negative    Illegal Drugs  Negative    Rx Drugs  Negative      History of Preadolescent Sexual Abuse   History of Preadolescent Sexual Abuse  Negative or Female      Psychological Disease   Psychological Disease  Negative    Depression  Negative      Total Score   Opioid Risk Tool Scoring  0    Opioid Risk Interpretation  Low Risk      ORT Scoring interpretation table:  Score <3 = Low Risk for SUD  Score between 4-7 = Moderate Risk for SUD  Score >8 = High Risk for Opioid Abuse   Risk Mitigation Strategies:  Patient Counseling: patient no longer candidate for opioid therapy given +UDS for cocaine Patient-Prescriber Agreement (PPA): Medication agreement broken by patient  Notification to other healthcare providers: N/A. Opioid therapy discontinued  Pharmacologic Plan: Ms. Waszak is not a  candidate for opioid therapy at this time. UDS + cocaine, patient not truthful about medication intake, argumentative and very defensive about her UDS             Laboratory Chemistry  Inflammation Markers (CRP: Acute Phase) (ESR: Chronic Phase) No results found for: CRP, ESRSEDRATE, LATICACIDVEN                       Rheumatology Markers Lab Results  Component Value Date   ANA Negative 11/29/2016  Renal Function Markers Lab Results  Component Value Date   BUN 13 11/03/2017   CREATININE 0.75 11/03/2017   GFRAA 104 11/03/2017   GFRNONAA 90 11/03/2017                              Hepatic Function Markers Lab Results  Component Value Date   AST 25 03/14/2017   ALT 15 03/14/2017   ALBUMIN 3.4 (L) 03/14/2017   ALKPHOS 97 03/14/2017   LIPASE 31 08/01/2016                        Electrolytes Lab Results  Component Value Date   NA 138 11/03/2017   K 3.9 11/03/2017   CL 102 11/03/2017   CALCIUM 9.3 11/03/2017                        Neuropathy Markers Lab Results  Component Value Date   HIV Reactive (A) 11/29/2016                        Bone Pathology Markers No results found for: [place], [id number], EX5284XL2, GM0102VO5, 25OHVITD1, 25OHVITD2, 25OHVITD3, TESTOFREE, TESTOSTERONE                       Coagulation Parameters Lab Results  Component Value Date   PLT 426 03/14/2017                        Cardiovascular Markers Lab Results  Component Value Date   BNP 193.0 (H) 12/03/2016   CKTOTAL 241 (H) 04/15/2013   CKMB 1.3 04/15/2013   TROPONINI <0.03 03/14/2017   HGB 10.7 (L) 03/14/2017   HCT 33.1 (L) 03/14/2017                         CA Markers No results found for: CEA, CA125, LABCA2                      Note: Lab results reviewed.  Recent Diagnostic Imaging Results  DG C-Arm 1-60 Min-No Report Fluoroscopy was utilized by the requesting physician.  No radiographic  interpretation.   Complexity Note: Imaging results reviewed.  Results shared with Ms. Mccloud, using Layman's terms.                         Meds   Current Outpatient Medications:  .  albuterol (PROVENTIL HFA;VENTOLIN HFA) 108 (90 BASE) MCG/ACT inhaler, Inhale 2 puffs into the lungs every 6 (six) hours as needed for wheezing or shortness of breath. , Disp: , Rfl:  .  amLODipine (NORVASC) 10 MG tablet, Take 10 mg by mouth daily. , Disp: , Rfl:  .  Azelastine HCl 0.15 % SOLN, Place 1 spray into both nostrils as needed., Disp: , Rfl:  .  clobetasol ointment (TEMOVATE) 3.66 %, Apply 1 application topically as needed., Disp: , Rfl:  .  DEXILANT 60 MG capsule, Take 60 mg by mouth daily. , Disp: , Rfl:  .  Fluticasone-Salmeterol (ADVAIR DISKUS) 500-50 MCG/DOSE AEPB, Inhale 1 puff into the lungs 2 (two) times daily., Disp: 60 each, Rfl: 5 .  GENVOYA 150-150-200-10 MG TABS tablet, Take 1 tablet by mouth daily., Disp: , Rfl:  .  hydrochlorothiazide (HYDRODIURIL) 25 MG tablet, Take  25 mg by mouth daily., Disp: , Rfl:  .  ipratropium-albuterol (DUONEB) 0.5-2.5 (3) MG/3ML SOLN, Inhale 3 mLs into the lungs every 6 (six) hours as needed (for wheezing/shortness of breath). , Disp: , Rfl:  .  losartan (COZAAR) 50 MG tablet, Take 50 mg by mouth daily., Disp: , Rfl:  .  omeprazole (PRILOSEC) 40 MG capsule, Take 40 mg by mouth daily. , Disp: , Rfl:  .  promethazine (PHENERGAN) 25 MG tablet, Take 25 mg by mouth every 6 (six) hours as needed for nausea or vomiting. , Disp: , Rfl:  .  ranitidine (ZANTAC) 150 MG tablet, Take 150 mg by mouth daily., Disp: , Rfl:  .  traZODone (DESYREL) 100 MG tablet, , Disp: , Rfl:  .  triamcinolone cream (KENALOG) 0.1 %, Apply 1 application topically 3 times/day as needed-between meals & bedtime., Disp: , Rfl:  .  valACYclovir (VALTREX) 500 MG tablet, Take 500 mg by mouth 2 (two) times daily., Disp: , Rfl:  .  efavirenz-emtricitabine-tenofovir (ATRIPLA) 600-200-300 MG per tablet, Take 1 tablet by mouth at bedtime., Disp: , Rfl:   ROS   Constitutional: Denies any fever or chills Gastrointestinal: No reported hemesis, hematochezia, vomiting, or acute GI distress Musculoskeletal: Denies any acute onset joint swelling, redness, loss of ROM, or weakness Neurological: No reported episodes of acute onset apraxia, aphasia, dysarthria, agnosia, amnesia, paralysis, loss of coordination, or loss of consciousness  Allergies  Ms. Bohlken is allergic to morphine and related; gabapentin; and zanaflex  [tizanidine].  PFSH  Drug: Ms. Silverthorn  reports that she does not use drugs. Alcohol:  reports that she drinks about 3.0 oz of alcohol per week. Tobacco:  reports that she has been smoking cigarettes.  She has smoked for the past 38.00 years. She has never used smokeless tobacco. Medical:  has a past medical history of Allergic rhinitis, Arthritis, Asthma, Blepharitis, Bronchitis, Cigarette smoker, Claustrophobia, COPD (chronic obstructive pulmonary disease) ([place]), Cough, Depression, Eye irritation, Genital herpes, GERD (gastroesophageal reflux disease), Headache, HIV (human immunodeficiency virus infection) ([place]), Hypertension, Paresthesia of hand, bilateral, and Shortness of breath dyspnea. Surgical: Ms. Aispuro  has a past surgical history that includes Knee surgery (Bilateral, [phone]); Hand surgery (Bilateral); Tubal ligation; Laparoscopy for ectopic pregnancy; Bunionectomy (Bilateral); Finger fracture (Right); back surgery may 2016; Video bronchoscopy (N/A, 06/30/2015); Diagnostic laparoscopy; Back surgery; Joint replacement (Bilateral); Cardiac catheterization (N/A, 01/12/2016); Cardiac catheterization (N/A, 01/12/2016); and Esophagogastroduodenoscopy (Left, 01/11/2016). Family: family history includes Breast cancer in her sister.  Constitutional Exam  General appearance: Well nourished, well developed, and well hydrated. In no apparent acute distress Vitals:   12/05/17 1350  BP: 129/76  Pulse: 82  Resp: 16  Temp: 98.4 F (36.9 C)   TempSrc: Oral  SpO2: 98%  Weight: (!) 304 lb (137.9 kg)  Height: '5\' 6"'$  (1.676 m)   BMI Assessment: Estimated body mass index is 49.07 kg/m as calculated from the following:   Height as of this encounter: '5\' 6"'$  (1.676 m).   Weight as of this encounter: 304 lb (137.9 kg).  BMI interpretation table: BMI level Category Range association with higher incidence of chronic pain  <18 kg/m2 Underweight   18.5-24.9 kg/m2 Ideal body weight   25-29.9 kg/m2 Overweight Increased incidence by 20%  30-34.9 kg/m2 Obese (Class I) Increased incidence by 68%  35-39.9 kg/m2 Severe obesity (Class II) Increased incidence by 136%  >40 kg/m2 Extreme obesity (Class III) Increased incidence by 254%   BMI Readings from Last 4 Encounters:  12/05/17 49.07 kg/m  11/07/17 49.07 kg/m  11/03/17 49.07 kg/m  09/12/17 43.58 kg/m   Wt Readings from Last 4 Encounters:  12/05/17 (!) 304 lb (137.9 kg)  11/07/17 (!) 304 lb (137.9 kg)  11/03/17 (!) 304 lb (137.9 kg)  09/12/17 270 lb (122.5 kg)  Psych/Mental status: Alert, oriented x 3 (person, place, & time)       Eyes: PERLA Respiratory: No evidence of acute respiratory distress  Cervical Spine Area Exam  Skin & Axial Inspection: No masses, redness, edema, swelling, or associated skin lesions Alignment: Symmetrical Functional ROM: Decreased ROM      Stability: No instability detected Muscle Tone/Strength: Functionally intact. No obvious neuro-muscular anomalies detected. Sensory (Neurological): Articular pain pattern Palpation: No palpable anomalies              Upper Extremity (UE) Exam    Side: Right upper extremity  Side: Left upper extremity  Skin & Extremity Inspection: Skin color, temperature, and hair growth are WNL. No peripheral edema or cyanosis. No masses, redness, swelling, asymmetry, or associated skin lesions. No contractures.  Skin & Extremity Inspection: Skin color, temperature, and hair growth are WNL. No peripheral edema or cyanosis. No  masses, redness, swelling, asymmetry, or associated skin lesions. No contractures.  Functional ROM: Unrestricted ROM          Functional ROM: Unrestricted ROM          Muscle Tone/Strength: Functionally intact. No obvious neuro-muscular anomalies detected.  Muscle Tone/Strength: Functionally intact. No obvious neuro-muscular anomalies detected.  Sensory (Neurological): Unimpaired          Sensory (Neurological): Unimpaired          Palpation: No palpable anomalies              Palpation: No palpable anomalies              Specialized Test(s): Deferred         Specialized Test(s): Deferred          Thoracic Spine Area Exam  Skin & Axial Inspection: No masses, redness, or swelling Alignment: Symmetrical Functional ROM: Unrestricted ROM Stability: No instability detected Muscle Tone/Strength: Functionally intact. No obvious neuro-muscular anomalies detected. Sensory (Neurological): Unimpaired Muscle strength & Tone: No palpable anomalies  Lumbar Spine Area Exam  Skin & Axial Inspection: No masses, redness, or swelling Alignment: Symmetrical Functional ROM: Unrestricted ROM      Stability: No instability detected Muscle Tone/Strength: Functionally intact. No obvious neuro-muscular anomalies detected. Sensory (Neurological): Unimpaired Palpation: No palpable anomalies       Provocative Tests: Lumbar Hyperextension and rotation test: evaluation deferred today       Lumbar Lateral bending test: evaluation deferred today       Patrick's Maneuver: evaluation deferred today                    Gait & Posture Assessment  Ambulation: Unassisted Gait: Relatively normal for age and body habitus Posture: WNL   Lower Extremity Exam    Side: Right lower extremity  Side: Left lower extremity  Skin & Extremity Inspection: Skin color, temperature, and hair growth are WNL. No peripheral edema or cyanosis. No masses, redness, swelling, asymmetry, or associated skin lesions. No contractures.  Skin &  Extremity Inspection: Skin color, temperature, and hair growth are WNL. No peripheral edema or cyanosis. No masses, redness, swelling, asymmetry, or associated skin lesions. No contractures.  Functional ROM: Unrestricted ROM          Functional ROM: Unrestricted ROM  Muscle Tone/Strength: Functionally intact. No obvious neuro-muscular anomalies detected.  Muscle Tone/Strength: Functionally intact. No obvious neuro-muscular anomalies detected.  Sensory (Neurological): Unimpaired  Sensory (Neurological): Unimpaired  Palpation: No palpable anomalies  Palpation: No palpable anomalies   Assessment  Primary Diagnosis & Pertinent Problem List: The primary encounter diagnosis was DDD (degenerative disc disease), lumbar. Diagnoses of Degenerative disc disease, lumbar, Lumbar pseudoarthrosis, Cervicalgia, HIV disease ([place]), Neck pain, Violation of controlled substance agreement, Positive urine drug screen (+cocaine), and Drug-seeking behavior were also pertinent to this visit.  Status Diagnosis  Controlled Controlled Controlled 1. DDD (degenerative disc disease), lumbar   2. Degenerative disc disease, lumbar   3. Lumbar pseudoarthrosis   4. Cervicalgia   5. HIV disease ([place])   6. Neck pain   7. Violation of controlled substance agreement   8. Positive urine drug screen (+cocaine)   9. Drug-seeking behavior     Problems updated and reviewed during this visit: Problem  Violation of Controlled Substance Agreement  Positive urine drug screen (+cocaine)  Cervicalgia  Drug-Seeking Behavior   Patient is high risk for opioid prescribing. The patient will not be a candidate for opioid therapy through this clinic (UDS + cocaine). Discuss  non-opioid adjunctive analgesics but patient not interested.  57 year old female with a history of chronic pain secondary to cervicalgia, cervical degenerative disc disease, cervical facet arthropathy, lumbar degenerative disc disease, lumbar pseudoarthrosis who  presents today for medication refill.  Patient's urine drug screen came back positive for cocaine.  She was sent a letter approximately 3 weeks ago explaining these results and also indicating that [facility] would not be able to continue opioid therapy for her given use of illicit substances.  Patient was dishonest at her last clinic visit and was very defensive and argumentative today stating that she did not use cocaine and that oxycodone is the only medication that works.  At this point, due to safety reasons and the fact that the patient has lied to me in the past, we will no longer be able to continue oxycodone therapy.  Patient will need to focus on non-opioid analgesics and I have given her the opportunity to see our nurse practitioner for non-opioid-based chronic pain management but the patient was very angry and not interested.  I also recommended the patient see a psychiatrist or an addictionologist for substance abuse disorder however the patient was very frustrated and left.  Plan: Absolutely no opioid therapy from [facility] due to violation of pain contract; UDS positive for cocaine and its metabolites.  Patient provided resources for addiction management as well as opportunity to see alternative provider at our clinic for non-opioid-based chronic pain management but the patient was not interested.  Time Note: Greater than 50% of the 25 minute(s) of face-to-face time spent with Ms. Klett, was spent in counseling/coordination of care regarding: the appropriate use of the pain scale, Ms. Kasa primary cause of pain, the appropriate use of her medications, the medication agreement and the patient's responsibilities when it comes to controlled substances.  Primary Care Physician: Theotis Burrow, MD Location: [facility] Note by: Gillis Santa, M.D Date: 12/05/2017; Time: 2:35 PM  There are no Patient Instructions on file for this visit.

## 2017-12-05 NOTE — Progress Notes (Signed)
Nursing Pain Medication Assessment:  Safety precautions to be maintained throughout the outpatient stay will include: orient to surroundings, keep bed in low position, maintain call bell within reach at all times, provide assistance with transfer out of bed and ambulation.  Medication Inspection Compliance: Pill count conducted under aseptic conditions, in front of the patient. Neither the pills nor the bottle was removed from the patient's sight at any time. Once count was completed pills were immediately returned to the patient in their original bottle.  Medication: Oxycodone IR Pill/Patch Count: 3 of 90 pills remain Pill/Patch Appearance: Markings consistent with prescribed medication Bottle Appearance: Standard pharmacy container. Clearly labeled. Filled Date: 03 / 11 / 2019 Last Medication intake:  Today

## 2017-12-27 NOTE — Progress Notes (Signed)
* [facility]     Assessment and Plan:  Asthma, persistent. -Uncertain how much of her dyspnea secondary to asthma versus other conditions, such as obesity and smoking. - Currently she is not compliant with her inhalers, I asked her to start using her albuterol metered-dose inhaler 2 puffs twice daily to see if this helps with her symptoms.  If it does then I would consider starting her on a long-acting inhaler and/or a inhaled steroid. -Reiterated the importance of complete smoking cessation even occasional cigarette can make her asthma worse.  Pneumonia. - Patient had a severe cavitary pneumonia in April 2018, she was previously asked to repeat chest imaging after that, which does not appear to have been done. - We will check a chest x-ray at this time.  Nicotine abuse. - Discussed importance of smoke cessation, spent 3 minutes in discussion.  Obesity. - Discussed that obesity is likely contributing to her dyspnea, we discussed possible weight loss, asked her to continue to follow-up with nutrition.  Dyspnea on exertion. - Left the multifactorial secondary to above conditions.   Date: 12/28/2017  MRN# [id number] Melanie Bowen [birth year]   Melanie Bowen is a 57 y.o. old female seen in follow up for chief complaint of  Chief Complaint  Patient presents with  . Follow-up    SOB: cough due to allergies:      HPI:  Patient is a 57 year old female, HIV positive.  She was last seen by our service inpatient in April 2018, at that time it was noted that she had a severe left lower lobe pneumonia, small cavitary component in the left upper lobe.  Sputum was positive for MRSA, therefore patient was treated with Bactrim upon discharge.  She was also noted to have a history of asthma.  She was subsequently seen at [facility] pulmonary on 03/28/2017, it was recommended she have a repeat CT chest to ensure resolution.  She was also noted to have a history of asthma, she was  recommended to be on Advair regularly, and recommended smoking cessation.  She returns now with dyspnea on exertion which she attributes to her weight. She has proventil but does not use it. She has a nebulizer which she does not use. She stopped smoking for the most part but occasional takes a puff to get the taste. She does not have any recollection of getting another CT scan after her visit with a [facility] pulmonologist.  She gets winded with walking around her home, bathing. She denies nasal drainage. She has reflux controlled with medication. She has no pets but is considering to get a dog.   Medication:    Current Outpatient Medications:  .  albuterol (PROVENTIL HFA;VENTOLIN HFA) 108 (90 BASE) MCG/ACT inhaler, Inhale 2 puffs into the lungs every 6 (six) hours as needed for wheezing or shortness of breath. , Disp: , Rfl:  .  amLODipine (NORVASC) 10 MG tablet, Take 10 mg by mouth daily. , Disp: , Rfl:  .  Azelastine HCl 0.15 % SOLN, Place 1 spray into both nostrils as needed., Disp: , Rfl:  .  clobetasol ointment (TEMOVATE) 4.01 %, Apply 1 application topically as needed., Disp: , Rfl:  .  DEXILANT 60 MG capsule, Take 60 mg by mouth daily. , Disp: , Rfl:  .  Fluticasone-Salmeterol (ADVAIR DISKUS) 500-50 MCG/DOSE AEPB, Inhale 1 puff into the lungs 2 (two) times daily., Disp: 60 each, Rfl: 5 .  GENVOYA 150-150-200-10 MG TABS tablet, Take 1 tablet by  mouth daily., Disp: , Rfl:  .  hydrochlorothiazide (HYDRODIURIL) 25 MG tablet, Take 25 mg by mouth daily., Disp: , Rfl:  .  ipratropium-albuterol (DUONEB) 0.5-2.5 (3) MG/3ML SOLN, Inhale 3 mLs into the lungs every 6 (six) hours as needed (for wheezing/shortness of breath). , Disp: , Rfl:  .  losartan (COZAAR) 50 MG tablet, Take 50 mg by mouth daily., Disp: , Rfl:  .  omeprazole (PRILOSEC) 40 MG capsule, Take 40 mg by mouth daily. , Disp: , Rfl:  .  promethazine (PHENERGAN) 25 MG tablet, Take 25 mg by mouth every 6 (six) hours as needed for nausea or  vomiting. , Disp: , Rfl:  .  ranitidine (ZANTAC) 150 MG tablet, Take 150 mg by mouth daily., Disp: , Rfl:  .  traZODone (DESYREL) 100 MG tablet, , Disp: , Rfl:  .  triamcinolone cream (KENALOG) 0.1 %, Apply 1 application topically 3 times/day as needed-between meals & bedtime., Disp: , Rfl:  .  valACYclovir (VALTREX) 500 MG tablet, Take 500 mg by mouth 2 (two) times daily., Disp: , Rfl:    Allergies:  Morphine and related; Gabapentin; and Zanaflex  [tizanidine]  Review of Systems: Gen:  Denies  fever, sweats. HEENT: Denies blurred vision. Cvc:  No dizziness, chest pain or heaviness Resp:   Denies cough or sputum porduction. Gi: Denies swallowing difficulty, stomach pain. constipation, bowel incontinence Gu:  Denies bladder incontinence, burning urine Ext:   No Joint pain, stiffness. Skin: No skin rash, easy bruising. Endoc:  No polyuria, polydipsia. Psych: No depression, insomnia. Other:  All other systems were reviewed and found to be negative other than what is mentioned in the HPI.   Physical Examination:   VS: Ht 5\' 6"  (1.676 m)   Wt (!) 310 lb (140.6 kg)   BMI 50.04 kg/m    General Appearance: No distress  Neuro:without focal findings,  speech normal,  HEENT: PERRLA, EOM intact. Pulmonary: normal breath sounds, No wheezing.   CardiovascularNormal S1,S2.  No m/r/g.   Abdomen: Benign, Soft, non-tender. Renal:  No costovertebral tenderness  GU:  Not performed at this time. Endoc: No evident thyromegaly, no signs of acromegaly. Skin:   warm, no rash. Extremities: normal, no cyanosis, clubbing.   LABORATORY PANEL:   CBC No results for input(s): WBC, HGB, HCT, PLT in the last 168 hours. ------------------------------------------------------------------------------------------------------------------  Chemistries  No results for input(s): NA, K, CL, CO2, GLUCOSE, BUN, CREATININE, CALCIUM, MG, AST, ALT, ALKPHOS, BILITOT in the last 168 hours.  Invalid input(s):  GFRCGP ------------------------------------------------------------------------------------------------------------------  Cardiac Enzymes No results for input(s): TROPONINI in the last 168 hours. ------------------------------------------------------------  RADIOLOGY:   No results found for this or any previous visit. Results for orders placed during the hospital encounter of 03/14/17  DG Chest 2 View   Narrative CLINICAL DATA:  57 y/o  F; 1 week of chest pain and cough.  EXAM: CHEST  2 VIEW  COMPARISON:  02/07/2017 chest radiograph  FINDINGS: Stable normal cardiac silhouette. Mild bronchitic changes. No consolidation, effusion, or pneumothorax. Bones are unremarkable.  IMPRESSION: Mild bronchitic changes.  No focal consolidation.   Electronically Signed   By: Kristine Garbe M.D.   On: 03/14/2017 18:09    ------------------------------------------------------------------------------------------------------------------  Thank  you for allowing [facility] to assist in the care of your patient. Our recommendations are noted above.  Please contact us if we can be of further service.   Marda Stalker, MD.   Pulmonary and Critical Care Office Number: [phone]  Patricia Pesa, M.D.  Merton Border, M.D  12/28/2017

## 2017-12-28 ENCOUNTER — Ambulatory Visit
Admission: RE | Admit: 2017-12-28 | Discharge: 2017-12-28 | Disposition: A | Discharge status: Home / Self Care | Payer: Medicare Other | Source: Ambulatory Visit | Attending: Internal Medicine | Admitting: Internal Medicine

## 2017-12-28 ENCOUNTER — Ambulatory Visit (INDEPENDENT_AMBULATORY_CARE_PROVIDER_SITE_OTHER): Payer: Medicare Other | Admitting: Internal Medicine

## 2017-12-28 ENCOUNTER — Encounter: Payer: Self-pay | Admitting: Internal Medicine

## 2017-12-28 VITALS — BP 120/70 | HR 94 | Ht 66.0 in | Wt 310.0 lb

## 2017-12-28 DIAGNOSIS — R0609 Other forms of dyspnea: Secondary | ICD-10-CM | POA: Diagnosis not present

## 2017-12-28 DIAGNOSIS — J189 Pneumonia, unspecified organism: Secondary | ICD-10-CM

## 2017-12-28 DIAGNOSIS — F17210 Nicotine dependence, cigarettes, uncomplicated: Secondary | ICD-10-CM | POA: Diagnosis not present

## 2017-12-28 DIAGNOSIS — J4540 Moderate persistent asthma, uncomplicated: Secondary | ICD-10-CM | POA: Diagnosis not present

## 2017-12-28 DIAGNOSIS — R0600 Dyspnea, unspecified: Secondary | ICD-10-CM

## 2017-12-28 NOTE — Patient Instructions (Signed)
Start using your proventil inhaler 2 puffs twice per day to see if this helps your breathing.  Continue to work on nutrition and weight loss which should help with your breathing.   Will send you for a chest x ray to make sure the pneumonia you had last year is better.

## 2018-02-02 ENCOUNTER — Other Ambulatory Visit: Payer: Self-pay | Admitting: Orthopedic Surgery

## 2018-02-02 DIAGNOSIS — M4326 Fusion of spine, lumbar region: Secondary | ICD-10-CM

## 2018-02-17 ENCOUNTER — Ambulatory Visit
Admission: RE | Admit: 2018-02-17 | Discharge: 2018-02-17 | Disposition: A | Discharge status: Home / Self Care | Payer: Medicare Other | Source: Ambulatory Visit | Attending: Orthopedic Surgery | Admitting: Orthopedic Surgery

## 2018-02-17 DIAGNOSIS — M5136 Other intervertebral disc degeneration, lumbar region: Secondary | ICD-10-CM | POA: Insufficient documentation

## 2018-02-17 DIAGNOSIS — M4326 Fusion of spine, lumbar region: Secondary | ICD-10-CM | POA: Insufficient documentation

## 2018-02-17 DIAGNOSIS — M4807 Spinal stenosis, lumbosacral region: Secondary | ICD-10-CM | POA: Diagnosis not present

## 2018-02-17 DIAGNOSIS — M48061 Spinal stenosis, lumbar region without neurogenic claudication: Secondary | ICD-10-CM | POA: Insufficient documentation

## 2018-02-17 LAB — POCT I-STAT CREATININE: Creatinine, Ser: 0.8 mg/dL (ref 0.44–1.00)

## 2018-02-17 MED ORDER — GADOBENATE DIMEGLUMINE 529 MG/ML IV SOLN
20.0000 mL | Freq: Once | INTRAVENOUS | Status: AC | PRN
  Administered 2018-02-17: 20 mL via INTRAVENOUS

## 2018-02-24 ENCOUNTER — Emergency Department
Admission: EM | Admit: 2018-02-24 | Discharge: 2018-02-24 | Disposition: A | Discharge status: Home / Self Care | Payer: Medicare Other | Attending: Emergency Medicine | Admitting: Emergency Medicine

## 2018-02-24 ENCOUNTER — Emergency Department: Payer: Medicare Other

## 2018-02-24 ENCOUNTER — Encounter: Payer: Self-pay | Admitting: Emergency Medicine

## 2018-02-24 ENCOUNTER — Other Ambulatory Visit: Payer: Self-pay

## 2018-02-24 DIAGNOSIS — R05 Cough: Secondary | ICD-10-CM | POA: Insufficient documentation

## 2018-02-24 DIAGNOSIS — M546 Pain in thoracic spine: Secondary | ICD-10-CM | POA: Diagnosis not present

## 2018-02-24 DIAGNOSIS — R1084 Generalized abdominal pain: Secondary | ICD-10-CM | POA: Diagnosis present

## 2018-02-24 DIAGNOSIS — Z21 Asymptomatic human immunodeficiency virus [HIV] infection status: Secondary | ICD-10-CM | POA: Insufficient documentation

## 2018-02-24 DIAGNOSIS — I10 Essential (primary) hypertension: Secondary | ICD-10-CM | POA: Diagnosis not present

## 2018-02-24 DIAGNOSIS — R1011 Right upper quadrant pain: Secondary | ICD-10-CM | POA: Diagnosis not present

## 2018-02-24 DIAGNOSIS — Z79899 Other long term (current) drug therapy: Secondary | ICD-10-CM | POA: Insufficient documentation

## 2018-02-24 DIAGNOSIS — J449 Chronic obstructive pulmonary disease, unspecified: Secondary | ICD-10-CM | POA: Insufficient documentation

## 2018-02-24 DIAGNOSIS — F17210 Nicotine dependence, cigarettes, uncomplicated: Secondary | ICD-10-CM | POA: Insufficient documentation

## 2018-02-24 LAB — CBC
HCT: 36 % (ref 35.0–47.0)
Hemoglobin: 11.7 g/dL — ABNORMAL LOW (ref 12.0–16.0)
MCH: 27.5 pg (ref 26.0–34.0)
MCHC: 32.6 g/dL (ref 32.0–36.0)
MCV: 84.5 fL (ref 80.0–100.0)
Platelets: 418 10*3/uL (ref 150–440)
RBC: 4.25 MIL/uL (ref 3.80–5.20)
RDW: 18.5 % — ABNORMAL HIGH (ref 11.5–14.5)
WBC: 6.7 10*3/uL (ref 3.6–11.0)

## 2018-02-24 LAB — URINALYSIS, COMPLETE (UACMP) WITH MICROSCOPIC
Bilirubin Urine: NEGATIVE
Glucose, UA: NEGATIVE mg/dL
Hgb urine dipstick: NEGATIVE
Ketones, ur: NEGATIVE mg/dL
Leukocytes, UA: NEGATIVE
Nitrite: NEGATIVE
Protein, ur: NEGATIVE mg/dL
Specific Gravity, Urine: 1.02 (ref 1.005–1.030)
pH: 5 (ref 5.0–8.0)

## 2018-02-24 LAB — BASIC METABOLIC PANEL
Anion gap: 7 (ref 5–15)
BUN: 16 mg/dL (ref 6–20)
CO2: 22 mmol/L (ref 22–32)
Calcium: 8.6 mg/dL — ABNORMAL LOW (ref 8.9–10.3)
Chloride: 107 mmol/L (ref 98–111)
Creatinine, Ser: 0.96 mg/dL (ref 0.44–1.00)
GFR calc Af Amer: >60 mL/min (ref >60)
GFR calc non Af Amer: >60 mL/min (ref >60)
Glucose, Bld: 106 mg/dL — ABNORMAL HIGH (ref 70–99)
Potassium: 3.5 mmol/L (ref 3.5–5.1)
Sodium: 136 mmol/L (ref 135–145)

## 2018-02-24 LAB — TROPONIN I: Troponin I: <0.03 ng/mL (ref <0.03)

## 2018-02-24 LAB — FIBRIN DERIVATIVES D-DIMER (ARMC ONLY): Fibrin derivatives D-dimer (ARMC): 531.77 ng/mL (FEU) — ABNORMAL HIGH (ref 0.00–499.00)

## 2018-02-24 MED ORDER — HYDROCODONE-ACETAMINOPHEN 5-325 MG PO TABS
1.0000 | ORAL_TABLET | Freq: Once | ORAL | Status: AC
  Administered 2018-02-24: 1 via ORAL
  Filled 2018-02-24: qty 1

## 2018-02-24 MED ORDER — IOPAMIDOL (ISOVUE-370) INJECTION 76%
100.0000 mL | Freq: Once | INTRAVENOUS | Status: AC | PRN
  Administered 2018-02-24: 100 mL via INTRAVENOUS

## 2018-02-24 MED ORDER — IBUPROFEN 600 MG PO TABS
600.0000 mg | ORAL_TABLET | Freq: Once | ORAL | Status: DC
  Filled 2018-02-24: qty 1

## 2018-02-24 MED ORDER — NAPROXEN 500 MG PO TABS: 500.0000 mg | ORAL_TABLET | Freq: Two times a day (BID) | ORAL | 0 refills | Status: DC

## 2018-02-24 NOTE — Discharge Instructions (Addendum)
Procedures - None  Please seek medical attention for any high fevers, chest pain, shortness of breath, change in behavior, persistent vomiting, bloody stool or any other new or concerning symptoms.

## 2018-02-24 NOTE — ED Provider Notes (Signed)
Both right upper quadrant ultrasound and urinalysis without concerning findings.  The patient d-dimer did come back elevated.  I discussed this with the patient.  A CT angios for PE was obtained.  This did not show any pulmonary embolism or other obvious etiology of the patient's discomfort.  At this point unclear etiology.  Do wonder if zosters is possible this could just be pain prior to the rash eruption.  Discussed this with the patient.  Patient did feel comfortable with discharge home.   Nance Pear, MD 02/24/18 [phone]

## 2018-02-24 NOTE — ED Triage Notes (Signed)
Pt arrives via POV with right-sided pain in back by bra strap that wraps around to right side of chest. Began when she woke up today. Denies any injury. States she feels like someone is stabbing her. Hurts more to move and to take deep breaths. Reports some shortness of breath.   Pt is a smoker. Took birth control pills when younger but not since she was "21-something."

## 2018-02-24 NOTE — ED Notes (Signed)
Patient transported to CT at this time.  Will continue to monitor. 

## 2018-02-24 NOTE — ED Provider Notes (Addendum)
[facility] Emergency Department Provider Note ____________________________________________   First MD Initiated Contact with Patient 02/24/18 1354     (approximate)  I have reviewed the triage vital signs and the nursing notes.   HISTORY  Chief Complaint Back Pain    HPI Melanie Bowen is a 57 y.o. female with PMH as noted below who presents with right sided flank and mid back pain, radiating around to the right side of her abdomen, acute onset this morning, and worse when she takes a deep breath.  Patient also reports nonproductive cough over the last several days.  She states that with the pain she is gotten some shortness of breath, but has no chest pain.  No nausea or vomiting, and no fever.  No prior history of this pain.  She states that the pain is worse when she touches it or gets pressed on by her bra strap.  No trauma or injury.  Patient denies leg swelling.  No urinary symptoms.  Past Medical History:  Diagnosis Date  . Allergic rhinitis   . Arthritis    DDD  . Asthma   . Blepharitis   . Bronchitis   . Cigarette smoker   . Claustrophobia   . COPD (chronic obstructive pulmonary disease) ([place])   . Cough   . Depression   . Eye irritation   . Genital herpes   . GERD (gastroesophageal reflux disease)   . Headache   . HIV (human immunodeficiency virus infection) ([place])   . Hypertension   . Paresthesia of hand, bilateral   . Shortness of breath dyspnea    at times due to asthma    Patient Active Problem List   Diagnosis Date Noted  . Violation of controlled substance agreement 12/05/2017  . Positive urine drug screen (+cocaine) 12/05/2017  . Cervicalgia 12/05/2017  . Drug-seeking behavior 12/05/2017  . Community acquired pneumonia 11/28/2016  . Pneumonia 11/28/2016  . Allergic rhinitis 05/27/2016  . Tobacco abuse counseling 02/11/2016  . Symptomatic anemia   . Lower GI bleed   . Anemia 01/16/2016  . GI bleed 01/10/2016  . GERD  (gastroesophageal reflux disease) 01/10/2016  . HTN (hypertension) 01/10/2016  . Depression 01/10/2016  . HIV (human immunodeficiency virus infection) ([place]) 01/10/2016  . Lumbar pseudoarthrosis 10/31/2015  . Chronic cough   . Cough 06/19/2015  . DDD (degenerative disc disease), lumbar 12/31/2014  . Degenerative disc disease, lumbar 12/31/2014  . Asthma, chronic 11/12/2014    Past Surgical History:  Procedure Laterality Date  . BACK SURGERY    . back surgery may 2016     lumbar   . BUNIONECTOMY Bilateral    feet  . DIAGNOSTIC LAPAROSCOPY    . ESOPHAGOGASTRODUODENOSCOPY Left 01/11/2016   Procedure: ESOPHAGOGASTRODUODENOSCOPY (EGD);  Surgeon: Hulen Luster, MD;  Location: [facility] ENDOSCOPY;  Service: Endoscopy;  Laterality: Left;  . Finger fracture Right    5th finger  . HAND SURGERY Bilateral    carpal tunnel  . JOINT REPLACEMENT Bilateral    knees  . KNEE SURGERY Bilateral 2003,2007   total Joint  . LAPAROSCOPY FOR ECTOPIC PREGNANCY    . PERIPHERAL VASCULAR CATHETERIZATION N/A 01/12/2016   Procedure: Visceral Angiography;  Surgeon: Algernon Huxley, MD;  Location: [place] CV LAB;  Service: Cardiovascular;  Laterality: N/A;  . PERIPHERAL VASCULAR CATHETERIZATION N/A 01/12/2016   Procedure: Visceral Artery Intervention;  Surgeon: Algernon Huxley, MD;  Location: [place] [facility];  Service: Cardiovascular;  Laterality: N/A;  .  TUBAL LIGATION    . VIDEO BRONCHOSCOPY N/A 06/30/2015   Procedure: VIDEO BRONCHOSCOPY WITHOUT FLUORO;  Surgeon: Vilinda Boehringer, MD;  Location: [facility] ORS;  Service: Cardiopulmonary;  Laterality: N/A;    Prior to Admission medications   Medication Sig Start Date End Date Taking? Authorizing Provider  albuterol (PROVENTIL HFA;VENTOLIN HFA) 108 (90 BASE) MCG/ACT inhaler Inhale 2 puffs into the lungs every 6 (six) hours as needed for wheezing or shortness of breath.     [provider]  amLODipine (NORVASC) 10 MG tablet Take 10 mg by mouth daily.     [provider]  Azelastine HCl 0.15 % SOLN Place 1 spray into both nostrils as needed. 02/23/16   [provider]  clobetasol ointment (TEMOVATE) 2.68 % Apply 1 application topically as needed. 11/15/16   [provider]  DEXILANT 60 MG capsule Take 60 mg by mouth daily.  04/14/17   [provider]  Fluticasone-Salmeterol (ADVAIR DISKUS) 500-50 MCG/DOSE AEPB Inhale 1 puff into the lungs 2 (two) times daily. 05/27/16   Mungal, Vishal, MD  GENVOYA 150-150-200-10 MG TABS tablet Take 1 tablet by mouth daily. 11/09/16   [provider]  hydrochlorothiazide (HYDRODIURIL) 25 MG tablet Take 25 mg by mouth daily.    [provider]  ipratropium-albuterol (DUONEB) 0.5-2.5 (3) MG/3ML SOLN Inhale 3 mLs into the lungs every 6 (six) hours as needed (for wheezing/shortness of breath).     [provider]  losartan (COZAAR) 50 MG tablet Take 50 mg by mouth daily.    [provider]  omeprazole (PRILOSEC) 40 MG capsule Take 40 mg by mouth daily.     [provider]  promethazine (PHENERGAN) 25 MG tablet Take 25 mg by mouth every 6 (six) hours as needed for nausea or vomiting.     [provider]  ranitidine (ZANTAC) 150 MG tablet Take 150 mg by mouth daily. 10/25/17   [provider]  traZODone (DESYREL) 100 MG tablet  02/14/17   [provider]  triamcinolone cream (KENALOG) 0.1 % Apply 1 application topically 3 times/day as needed-between meals & bedtime. 09/30/17 09/30/18  [provider]  valACYclovir (VALTREX) 500 MG tablet Take 500 mg by mouth 2 (two) times daily.    [provider]    Allergies Morphine and related; Gabapentin; and Zanaflex  [tizanidine]  Family History  Problem Relation Age of Onset  . Breast cancer Sister     Social History Social History   Tobacco Use  . Smoking status: Current Some Day Smoker    Packs/day: 0.25    Years: 38.00    Pack years: 9.50    Types: Cigarettes    . Smokeless tobacco: Never Used  . Tobacco comment: 1 cig daily  Substance Use Topics  . Alcohol use: Yes    Alcohol/week: 3.0 oz    Types: 5 Glasses of wine per week    Comment: wine - couple days of week  . Drug use: No    Comment: former    Review of Systems  Constitutional: No fever. Eyes: No redness. ENT: No neck pain. Cardiovascular: Denies chest pain. Respiratory: Positive for shortness of breath. Gastrointestinal: No vomiting.  Genitourinary: Negative for dysuria or hematuria.  Musculoskeletal: Positive for back pain. Skin: Negative for rash. Neurological: Negative for headaches, focal weakness or numbness.   ____________________________________________   PHYSICAL EXAM:  VITAL SIGNS: ED Triage Vitals [02/24/18 1205]  Enc Vitals Group     BP 128/73  Pulse Rate 91     Resp 20     Temp 99.2 F (37.3 C)     Temp Source Oral     SpO2 96 %     Weight (!) 306 lb (138.8 kg)     Height 5\' 6"  (1.676 m)     Head Circumference      Peak Flow      Pain Score 9     Pain Loc      Pain Edu?      Excl. in [place]?     Constitutional: Alert and oriented. Well appearing and in no acute distress. Eyes: Conjunctivae are normal.  Head: Atraumatic. Nose: No congestion/rhinnorhea. Mouth/Throat: Mucous membranes are moist.   Neck: Normal range of motion.  Cardiovascular: Normal rate, regular rhythm. Grossly normal heart sounds.  Good peripheral circulation. Respiratory: Normal respiratory effort.  No retractions. Lungs CTAB. Gastrointestinal: Soft an with mild right upper quadrant tenderness.  No distention.  Genitourinary: No CVA tenderness. Musculoskeletal: No lower extremity edema.  No calf or popliteal swelling or tenderness.  Extremities warm and well perfused.  Reproducible right lateral thoracic back mild tenderness. Neurologic:  Normal speech and language. No gross focal neurologic deficits are appreciated.  Skin:  Skin is warm and dry. No rash  noted. Psychiatric: Mood and affect are normal. Speech and behavior are normal.  ____________________________________________   LABS (all labs ordered are listed, but only abnormal results are displayed)  Labs Reviewed  BASIC METABOLIC PANEL - Abnormal; Notable for the following components:      Result Value   Glucose, Bld 106 (*)    Calcium 8.6 (*)    All other components within normal limits  CBC - Abnormal; Notable for the following components:   Hemoglobin 11.7 (*)    RDW 18.5 (*)    All other components within normal limits  TROPONIN I  URINALYSIS, COMPLETE (UACMP) WITH MICROSCOPIC  FIBRIN DERIVATIVES D-DIMER ([facility] ONLY)  POC URINE PREG, ED   ____________________________________________  EKG  ED ECG REPORT I, Arta Silence, the attending physician, personally viewed and interpreted this ECG.  Date: 02/24/2018 EKG Time: 1159 Rate: 91 Rhythm: normal sinus rhythm QRS Axis: normal Intervals: normal ST/T Wave abnormalities: Nonspecific lateral T wave flattening Narrative Interpretation: no evidence of acute ischemia  ____________________________________________  RADIOLOGY  [place] RUQ: No gallstones, cholecystitis, or other acute findings  ____________________________________________   PROCEDURES  Procedure(s) performed: No  Procedures  Critical Care performed: No ____________________________________________   INITIAL IMPRESSION / ASSESSMENT AND PLAN / ED COURSE  Pertinent labs & imaging results that were available during my care of the patient were reviewed by me and considered in my medical decision making (see chart for details).  57 year old female with PMH as noted above presents with acute onset of right sided thoracic back and flank pain this morning which is reproducible to palpation, but also somewhat pleuritic and associated with some shortness of breath.  On exam, the patient is relatively well-appearing.  Vital signs are normal except for  low-grade temperature, and the remainder of the exam is as described above.  The pain is somewhat reproducible to palpation.  There is also some right upper quadrant abdominal discomfort.  No neurologic deficits.  Differential includes most likely benign cause such asmuscular pain or radiculopathy, however I am also considering ureteral stone or UTI, gallstones, or less likely PE.  Plan: Basic labs, d-dimer, UA, chest x-ray, right upper quadrant ultrasound, and reassess.  ----------------------------------------- 3:37 PM on 02/24/2018 -----------------------------------------  Ultrasound is negative.  Patient is still pending d-dimer and UA.  I am signing the patient out to the oncoming physician Dr. Archie Balboa.  Anticipate discharge home if these tests are negative.  ____________________________________________   FINAL CLINICAL IMPRESSION(S) / ED DIAGNOSES  Final diagnoses:  Right upper quadrant abdominal pain  Acute right-sided thoracic back pain      NEW MEDICATIONS STARTED DURING THIS VISIT:  New Prescriptions   No medications on file     Note:  This document was prepared using Dragon voice recognition software and may include unintentional dictation errors.     Arta Silence, MD 02/24/18 1538    Arta Silence, MD 03/09/18 [phone]

## 2018-02-24 NOTE — ED Notes (Signed)
Patient transported to [place] at this time.  Will continue to monitor.

## 2018-02-24 NOTE — ED Notes (Signed)
Patient reports back/chest pain on the right side that began this morning.  Patient describes pain as throbbing.  Patient states pain is worse when she lies down, when she takes a deep breath and when she coughs.  Patient is in no obvious distress at this time.

## 2018-03-19 ENCOUNTER — Other Ambulatory Visit: Payer: Self-pay

## 2018-03-19 ENCOUNTER — Encounter: Payer: Self-pay | Admitting: Emergency Medicine

## 2018-03-19 ENCOUNTER — Emergency Department: Payer: Medicare Other

## 2018-03-19 ENCOUNTER — Emergency Department
Admission: EM | Admit: 2018-03-19 | Discharge: 2018-03-19 | Disposition: A | Discharge status: Home / Self Care | Payer: Medicare Other | Attending: Student in an Organized Health Care Education/Training Program | Admitting: Student in an Organized Health Care Education/Training Program

## 2018-03-19 DIAGNOSIS — I10 Essential (primary) hypertension: Secondary | ICD-10-CM | POA: Diagnosis not present

## 2018-03-19 DIAGNOSIS — Y929 Unspecified place or not applicable: Secondary | ICD-10-CM | POA: Diagnosis not present

## 2018-03-19 DIAGNOSIS — Y9389 Activity, other specified: Secondary | ICD-10-CM | POA: Diagnosis not present

## 2018-03-19 DIAGNOSIS — Y998 Other external cause status: Secondary | ICD-10-CM | POA: Diagnosis not present

## 2018-03-19 DIAGNOSIS — B20 Human immunodeficiency virus [HIV] disease: Secondary | ICD-10-CM | POA: Insufficient documentation

## 2018-03-19 DIAGNOSIS — J45909 Unspecified asthma, uncomplicated: Secondary | ICD-10-CM | POA: Insufficient documentation

## 2018-03-19 DIAGNOSIS — F17210 Nicotine dependence, cigarettes, uncomplicated: Secondary | ICD-10-CM | POA: Diagnosis not present

## 2018-03-19 DIAGNOSIS — Z79899 Other long term (current) drug therapy: Secondary | ICD-10-CM | POA: Insufficient documentation

## 2018-03-19 DIAGNOSIS — R0789 Other chest pain: Secondary | ICD-10-CM | POA: Insufficient documentation

## 2018-03-19 LAB — CBC
HCT: 35 % (ref 35.0–47.0)
Hemoglobin: 11.7 g/dL — ABNORMAL LOW (ref 12.0–16.0)
MCH: 27.7 pg (ref 26.0–34.0)
MCHC: 33.4 g/dL (ref 32.0–36.0)
MCV: 83 fL (ref 80.0–100.0)
Platelets: 397 10*3/uL (ref 150–440)
RBC: 4.21 MIL/uL (ref 3.80–5.20)
RDW: 17.5 % — ABNORMAL HIGH (ref 11.5–14.5)
WBC: 7.7 10*3/uL (ref 3.6–11.0)

## 2018-03-19 LAB — BASIC METABOLIC PANEL
Anion gap: 9 (ref 5–15)
BUN: 12 mg/dL (ref 6–20)
CO2: 23 mmol/L (ref 22–32)
Calcium: 9 mg/dL (ref 8.9–10.3)
Chloride: 105 mmol/L (ref 98–111)
Creatinine, Ser: 1.12 mg/dL — ABNORMAL HIGH (ref 0.44–1.00)
GFR calc Af Amer: >60 mL/min (ref >60)
GFR calc non Af Amer: 54 mL/min — ABNORMAL LOW (ref >60)
Glucose, Bld: 105 mg/dL — ABNORMAL HIGH (ref 70–99)
Potassium: 3.9 mmol/L (ref 3.5–5.1)
Sodium: 137 mmol/L (ref 135–145)

## 2018-03-19 LAB — TROPONIN I: Troponin I: <0.03 ng/mL (ref <0.03)

## 2018-03-19 LAB — FIBRIN DERIVATIVES D-DIMER (ARMC ONLY): Fibrin derivatives D-dimer (ARMC): 990.22 ng/mL (FEU) — ABNORMAL HIGH (ref 0.00–499.00)

## 2018-03-19 MED ORDER — HYDROCODONE-ACETAMINOPHEN 5-325 MG PO TABS
1.0000 | ORAL_TABLET | Freq: Once | ORAL | Status: AC
  Administered 2018-03-19: 1 via ORAL
  Filled 2018-03-19: qty 1

## 2018-03-19 MED ORDER — LIDOCAINE HCL (PF) 1 % IJ SOLN
INTRAMUSCULAR | Status: AC
  Filled 2018-03-19: qty 5

## 2018-03-19 MED ORDER — IOPAMIDOL (ISOVUE-370) INJECTION 76%
75.0000 mL | Freq: Once | INTRAVENOUS | Status: AC | PRN
  Administered 2018-03-19: 75 mL via INTRAVENOUS

## 2018-03-19 MED ORDER — LIDOCAINE 5 % EX PTCH: 1.0000 | MEDICATED_PATCH | Freq: Two times a day (BID) | CUTANEOUS | 0 refills | Status: DC

## 2018-03-19 MED ORDER — LIDOCAINE HCL (PF) 1 % IJ SOLN
5.0000 mL | Freq: Once | INTRAMUSCULAR | Status: AC
  Administered 2018-03-19: 5 mL via INTRADERMAL
  Filled 2018-03-19: qty 5

## 2018-03-19 MED ORDER — LIDOCAINE 5 % EX PTCH
1.0000 | MEDICATED_PATCH | CUTANEOUS | Status: DC
  Administered 2018-03-19: 1 via TRANSDERMAL
  Filled 2018-03-19: qty 1

## 2018-03-19 MED ORDER — HYDROCODONE-ACETAMINOPHEN 5-325 MG PO TABS: 1.0000 | ORAL_TABLET | ORAL | 0 refills | Status: DC | PRN
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
## 2018-03-19 NOTE — ED Notes (Signed)
Pt medicated with vicodin, demanding her friend be given ice and cola. Advised I could get her some more ice water but the colas are for pt's. Registration in with pt and iv team still in with pt.
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
## 2018-03-19 NOTE — ED Notes (Signed)
This RN in room with primary RN, as pt requested to speak with charge nurse. This RN introduced herself as the Agricultural consultant. Pt stated that she had no complaints against primary RN and that she was just in pain and was not felling well.  Pt was assisted in getting up to the restroom and was given a food tray and ginger ale. Pt given warm blanket.

## 2018-03-19 NOTE — ED Notes (Signed)
Patient transported to CT 

## 2018-03-19 NOTE — ED Notes (Signed)
Iv team in with pt - pt talking loudly on phone complaining about not being seen or given any pain medications yet. Family out to the desk requesting something to drink and given ice water.

## 2018-03-19 NOTE — ED Notes (Signed)
Pt need ct r/o pe. Dr in with pt to obtain access

## 2018-03-23 ENCOUNTER — Emergency Department: Payer: Medicare Other

## 2018-03-23 ENCOUNTER — Emergency Department
Admission: EM | Admit: 2018-03-23 | Discharge: 2018-03-23 | Disposition: A | Discharge status: Home / Self Care | Payer: Medicare Other | Attending: Emergency Medicine | Admitting: Emergency Medicine

## 2018-03-23 ENCOUNTER — Other Ambulatory Visit: Payer: Self-pay

## 2018-03-23 DIAGNOSIS — T7491XD Unspecified adult maltreatment, confirmed, subsequent encounter: Secondary | ICD-10-CM

## 2018-03-23 DIAGNOSIS — I10 Essential (primary) hypertension: Secondary | ICD-10-CM | POA: Diagnosis not present

## 2018-03-23 DIAGNOSIS — F17210 Nicotine dependence, cigarettes, uncomplicated: Secondary | ICD-10-CM | POA: Insufficient documentation

## 2018-03-23 DIAGNOSIS — Z79899 Other long term (current) drug therapy: Secondary | ICD-10-CM | POA: Insufficient documentation

## 2018-03-23 DIAGNOSIS — S20211D Contusion of right front wall of thorax, subsequent encounter: Secondary | ICD-10-CM

## 2018-03-23 DIAGNOSIS — B20 Human immunodeficiency virus [HIV] disease: Secondary | ICD-10-CM | POA: Diagnosis not present

## 2018-03-23 DIAGNOSIS — T7411XD Adult physical abuse, confirmed, subsequent encounter: Secondary | ICD-10-CM | POA: Insufficient documentation

## 2018-03-23 DIAGNOSIS — J449 Chronic obstructive pulmonary disease, unspecified: Secondary | ICD-10-CM | POA: Insufficient documentation

## 2018-03-23 DIAGNOSIS — S20301D Unspecified superficial injuries of right front wall of thorax, subsequent encounter: Secondary | ICD-10-CM | POA: Diagnosis present

## 2018-03-23 LAB — CBC
HCT: 37 % (ref 35.0–47.0)
Hemoglobin: 12 g/dL (ref 12.0–16.0)
MCH: 27.5 pg (ref 26.0–34.0)
MCHC: 32.5 g/dL (ref 32.0–36.0)
MCV: 84.7 fL (ref 80.0–100.0)
Platelets: 424 10*3/uL (ref 150–440)
RBC: 4.37 MIL/uL (ref 3.80–5.20)
RDW: 17.8 % — ABNORMAL HIGH (ref 11.5–14.5)
WBC: 8.8 10*3/uL (ref 3.6–11.0)

## 2018-03-23 LAB — BASIC METABOLIC PANEL
Anion gap: 10 (ref 5–15)
BUN: 17 mg/dL (ref 6–20)
CO2: 22 mmol/L (ref 22–32)
Calcium: 8.6 mg/dL — ABNORMAL LOW (ref 8.9–10.3)
Chloride: 106 mmol/L (ref 98–111)
Creatinine, Ser: 0.95 mg/dL (ref 0.44–1.00)
GFR calc Af Amer: >60 mL/min (ref >60)
GFR calc non Af Amer: >60 mL/min (ref >60)
Glucose, Bld: 113 mg/dL — ABNORMAL HIGH (ref 70–99)
Potassium: 3.3 mmol/L — ABNORMAL LOW (ref 3.5–5.1)
Sodium: 138 mmol/L (ref 135–145)

## 2018-03-23 LAB — TROPONIN I: Troponin I: <0.03 ng/mL (ref <0.03)

## 2018-03-23 MED ORDER — IBUPROFEN 600 MG PO TABS: 600.0000 mg | ORAL_TABLET | Freq: Three times a day (TID) | ORAL | 0 refills | Status: DC | PRN

## 2018-03-23 MED ORDER — HYDROCODONE-ACETAMINOPHEN 5-325 MG PO TABS: 1.0000 | ORAL_TABLET | Freq: Four times a day (QID) | ORAL | 0 refills | Status: DC | PRN

## 2018-03-23 MED ORDER — LIDOCAINE 5 % EX PTCH
1.0000 | MEDICATED_PATCH | Freq: Once | CUTANEOUS | Status: DC
  Administered 2018-03-23: 1 via TRANSDERMAL
  Filled 2018-03-23: qty 1

## 2018-03-23 MED ORDER — HYDROCODONE-ACETAMINOPHEN 5-325 MG PO TABS
2.0000 | ORAL_TABLET | Freq: Once | ORAL | Status: AC
  Administered 2018-03-23: 2 via ORAL
  Filled 2018-03-23: qty 2

## 2018-03-23 MED ORDER — KETOROLAC TROMETHAMINE 30 MG/ML IJ SOLN
30.0000 mg | Freq: Once | INTRAMUSCULAR | Status: AC
  Administered 2018-03-23: 30 mg via INTRAMUSCULAR
  Filled 2018-03-23: qty 1

## 2018-03-23 NOTE — ED Triage Notes (Signed)
Pt arrives to ED c/o of same symptoms from Sunday when she was seen. R sided CP, hurts worse when she moves. Coughing in triage. States pain began Sunday morning. Alert, oriented, taking in complete sentences. No distress noted.

## 2018-03-23 NOTE — ED Notes (Signed)
Pt c/o right sided chest pain that is worse with movement - she reports she was here Sunday for the same - pt reports shortness of breath - pt noted to have even and unlabored respirations and appears in NAD - she was found behind the nurses station walking around when nurse arrived to room to triage
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
## 2018-04-04 ENCOUNTER — Other Ambulatory Visit: Payer: Self-pay | Admitting: Family Medicine

## 2018-04-04 DIAGNOSIS — Z1231 Encounter for screening mammogram for malignant neoplasm of breast: Secondary | ICD-10-CM

## 2018-04-11 ENCOUNTER — Ambulatory Visit: Payer: Medicare Other | Admitting: Internal Medicine

## 2018-04-11 NOTE — Progress Notes (Deleted)
* [facility]     Assessment and Plan:  Asthma, persistent. -Uncertain how much of her dyspnea secondary to asthma versus other conditions, such as obesity and smoking. - Currently she is not compliant with her inhalers, I asked her to start using her albuterol metered-dose inhaler 2 puffs twice daily to see if this helps with her symptoms.  If it does then I would consider starting her on a long-acting inhaler and/or a inhaled steroid. -Reiterated the importance of complete smoking cessation even occasional cigarette can make her asthma worse.  Pneumonia. - Patient had a severe cavitary pneumonia in April 2018, she was previously asked to repeat chest imaging after that, which does not appear to have been done. - We will check a chest x-ray at this time.  Nicotine abuse. - Discussed importance of smoke cessation, spent 3 minutes in discussion.  Obesity. - Discussed that obesity is likely contributing to her dyspnea, we discussed possible weight loss, asked her to continue to follow-up with nutrition.  Dyspnea on exertion. - Left the multifactorial secondary to above conditions.   Date: 04/11/2018  MRN# [id number] Melanie Bowen [birth year]   Melanie Bowen is a 57 y.o. old female seen in follow up for chief complaint of  No chief complaint on file.    HPI:  Patient is a 57 year old female, HIV positive.  She was last seen about 3 months ago in the office and noted to have asthma.  She was asked to use her albuterol inhaler.  She was also asked to repeat a chest x-ray due to previous history of a severe cavitary pneumonia.  Smoking cessation was also discussed.  **Chest x-ray 03/23/2018, CT chest 03/19/2018>> reduced lung volumes due to severe obesity, lungs are unremarkable.  No significant change from previous CT chest on 02/24/18. Images personally reviewed.   Medication:    Current Outpatient Medications:  .  albuterol (PROVENTIL HFA;VENTOLIN HFA) 108  (90 BASE) MCG/ACT inhaler, Inhale 2 puffs into the lungs every 6 (six) hours as needed for wheezing or shortness of breath. , Disp: , Rfl:  .  amLODipine (NORVASC) 10 MG tablet, Take 10 mg by mouth daily. , Disp: , Rfl:  .  Azelastine HCl 0.15 % SOLN, Place 1 spray into both nostrils as needed., Disp: , Rfl:  .  clobetasol ointment (TEMOVATE) 1.74 %, Apply 1 application topically as needed., Disp: , Rfl:  .  DEXILANT 60 MG capsule, Take 60 mg by mouth daily. , Disp: , Rfl:  .  Fluticasone-Salmeterol (ADVAIR DISKUS) 500-50 MCG/DOSE AEPB, Inhale 1 puff into the lungs 2 (two) times daily., Disp: 60 each, Rfl: 5 .  GENVOYA 150-150-200-10 MG TABS tablet, Take 1 tablet by mouth daily., Disp: , Rfl:  .  hydrochlorothiazide (HYDRODIURIL) 25 MG tablet, Take 25 mg by mouth daily., Disp: , Rfl:  .  HYDROcodone-acetaminophen (NORCO) 5-325 MG tablet, Take 1 tablet by mouth every 6 (six) hours as needed for moderate pain or severe pain., Disp: 15 tablet, Rfl: 0 .  ibuprofen (ADVIL,MOTRIN) 600 MG tablet, Take 1 tablet (600 mg total) by mouth every 8 (eight) hours as needed., Disp: 30 tablet, Rfl: 0 .  ipratropium-albuterol (DUONEB) 0.5-2.5 (3) MG/3ML SOLN, Inhale 3 mLs into the lungs every 6 (six) hours as needed (for wheezing/shortness of breath). , Disp: , Rfl:  .  lidocaine (LIDODERM) 5 %, Place 1 patch onto the skin every 12 (twelve) hours. Remove & Discard patch within 12 hours or as directed by  MD, Disp: 10 patch, Rfl: 0 .  losartan (COZAAR) 50 MG tablet, Take 50 mg by mouth daily., Disp: , Rfl:  .  naproxen (NAPROSYN) 500 MG tablet, Take 1 tablet (500 mg total) by mouth 2 (two) times daily with a meal., Disp: 20 tablet, Rfl: 0 .  omeprazole (PRILOSEC) 40 MG capsule, Take 40 mg by mouth daily. , Disp: , Rfl:  .  promethazine (PHENERGAN) 25 MG tablet, Take 25 mg by mouth every 6 (six) hours as needed for nausea or vomiting. , Disp: , Rfl:  .  ranitidine (ZANTAC) 150 MG tablet, Take 150 mg by mouth daily.,  Disp: , Rfl:  .  traZODone (DESYREL) 100 MG tablet, , Disp: , Rfl:  .  triamcinolone cream (KENALOG) 0.1 %, Apply 1 application topically 3 times/day as needed-between meals & bedtime., Disp: , Rfl:  .  valACYclovir (VALTREX) 500 MG tablet, Take 500 mg by mouth 2 (two) times daily., Disp: , Rfl:    Allergies:  Morphine and related; Gabapentin; and Zanaflex  [tizanidine]     LABORATORY PANEL:   CBC No results for input(s): WBC, HGB, HCT, PLT in the last 168 hours. ------------------------------------------------------------------------------------------------------------------  Chemistries  No results for input(s): NA, K, CL, CO2, GLUCOSE, BUN, CREATININE, CALCIUM, MG, AST, ALT, ALKPHOS, BILITOT in the last 168 hours.  Invalid input(s): GFRCGP ------------------------------------------------------------------------------------------------------------------  Cardiac Enzymes No results for input(s): TROPONINI in the last 168 hours. ------------------------------------------------------------  RADIOLOGY:   No results found for this or any previous visit. Results for orders placed during the hospital encounter of 03/14/17  DG Chest 2 View   Narrative CLINICAL DATA:  57 y/o  F; 1 week of chest pain and cough.  EXAM: CHEST  2 VIEW  COMPARISON:  02/07/2017 chest radiograph  FINDINGS: Stable normal cardiac silhouette. Mild bronchitic changes. No consolidation, effusion, or pneumothorax. Bones are unremarkable.  IMPRESSION: Mild bronchitic changes.  No focal consolidation.   Electronically Signed   By: Kristine Garbe M.D.   On: 03/14/2017 18:09    ------------------------------------------------------------------------------------------------------------------  Thank  you for allowing [facility] to assist in the care of your patient. Our recommendations are noted above.  Please contact us if we can be of further service.   Marda Stalker, M.D., F.C.C.P.  Board Certified in Internal Medicine, Pulmonary Medicine, [place], and Sleep Medicine.   Pulmonary and Critical Care Office Number: [phone]   04/11/2018

## 2018-04-24 NOTE — Progress Notes (Signed)
* [facility]     Assessment and Plan:  Asthma, persistent. -Uncertain how much of her dyspnea secondary to asthma versus other conditions, such as obesity and smoking. - Currently she is not compliant with her inhalers, I asked her to start using her albuterol metered-dose inhaler 2 puffs twice daily to see if this helps with her symptoms.  If it does then I would consider starting her on a long-acting inhaler and/or a inhaled steroid. -Reiterated the importance of  smoking cessation.  Pneumonia. - Previous cavitary pneumonia which appears to have resolved.  Nicotine abuse. - Discussed importance of smoke cessation, spent 3 minutes in discussion.  Obesity. - Discussed that obesity is likely contributing to her dyspnea, we discussed possible weight loss, asked her to continue to follow-up with nutrition.  Dyspnea on exertion. - Left the multifactorial secondary to above conditions.  Return in about 6 months (around 10/26/2018).    Date: 04/24/2018  MRN# [id number] Melanie Bowen 1961-01-25   Melanie Bowen is a 57 y.o. old female seen in follow up for chief complaint of  Chief Complaint  Patient presents with  . Asthma    sob with exertion  . Cough    dark brown mucus     HPI:  Patient is a 57 year old female, HIV positive.  She was last seen about 3 months ago in the office and noted to have asthma.  She was asked to use her albuterol inhaler. Smoking cessation was also discussed.  Since her last visit her breathing continues to be rough. She is smoking less than a ppd. She is using wixella about twice weekly, once on those days. She uses albuterol the same way.  She continues to have dyspnea on exertion.   **Chest x-ray 03/23/2018, CT chest 03/19/2018>> reduced lung volumes due to severe obesity, lungs are unremarkable.  No significant change from previous CT chest on 02/24/18. Images personally reviewed.   Medication:    Current Outpatient  Medications:  .  albuterol (PROVENTIL HFA;VENTOLIN HFA) 108 (90 BASE) MCG/ACT inhaler, Inhale 2 puffs into the lungs every 6 (six) hours as needed for wheezing or shortness of breath. , Disp: , Rfl:  .  amLODipine (NORVASC) 10 MG tablet, Take 10 mg by mouth daily. , Disp: , Rfl:  .  Azelastine HCl 0.15 % SOLN, Place 1 spray into both nostrils as needed., Disp: , Rfl:  .  clobetasol ointment (TEMOVATE) 6.29 %, Apply 1 application topically as needed., Disp: , Rfl:  .  DEXILANT 60 MG capsule, Take 60 mg by mouth daily. , Disp: , Rfl:  .  Fluticasone-Salmeterol (ADVAIR DISKUS) 500-50 MCG/DOSE AEPB, Inhale 1 puff into the lungs 2 (two) times daily., Disp: 60 each, Rfl: 5 .  GENVOYA 150-150-200-10 MG TABS tablet, Take 1 tablet by mouth daily., Disp: , Rfl:  .  hydrochlorothiazide (HYDRODIURIL) 25 MG tablet, Take 25 mg by mouth daily., Disp: , Rfl:  .  HYDROcodone-acetaminophen (NORCO) 5-325 MG tablet, Take 1 tablet by mouth every 6 (six) hours as needed for moderate pain or severe pain., Disp: 15 tablet, Rfl: 0 .  ibuprofen (ADVIL,MOTRIN) 600 MG tablet, Take 1 tablet (600 mg total) by mouth every 8 (eight) hours as needed., Disp: 30 tablet, Rfl: 0 .  ipratropium-albuterol (DUONEB) 0.5-2.5 (3) MG/3ML SOLN, Inhale 3 mLs into the lungs every 6 (six) hours as needed (for wheezing/shortness of breath). , Disp: , Rfl:  .  lidocaine (LIDODERM) 5 %, Place 1 patch onto the  skin every 12 (twelve) hours. Remove & Discard patch within 12 hours or as directed by MD, Disp: 10 patch, Rfl: 0 .  losartan (COZAAR) 50 MG tablet, Take 50 mg by mouth daily., Disp: , Rfl:  .  naproxen (NAPROSYN) 500 MG tablet, Take 1 tablet (500 mg total) by mouth 2 (two) times daily with a meal., Disp: 20 tablet, Rfl: 0 .  omeprazole (PRILOSEC) 40 MG capsule, Take 40 mg by mouth daily. , Disp: , Rfl:  .  promethazine (PHENERGAN) 25 MG tablet, Take 25 mg by mouth every 6 (six) hours as needed for nausea or vomiting. , Disp: , Rfl:  .   ranitidine (ZANTAC) 150 MG tablet, Take 150 mg by mouth daily., Disp: , Rfl:  .  traZODone (DESYREL) 100 MG tablet, , Disp: , Rfl:  .  triamcinolone cream (KENALOG) 0.1 %, Apply 1 application topically 3 times/day as needed-between meals & bedtime., Disp: , Rfl:  .  valACYclovir (VALTREX) 500 MG tablet, Take 500 mg by mouth 2 (two) times daily., Disp: , Rfl:    Allergies:  Morphine and related; Gabapentin; and Zanaflex  [tizanidine]  Review of Systems:  Constitutional: Feels well. Cardiovascular: No chest pain.  Pulmonary: Denies hemoptysis The remainder of systems were reviewed and were found to be negative other than what is documented in the HPI.    Physical Examination:   VS: BP 124/84 (BP Location: Left Arm, Cuff Size: Large)   Pulse 82   Resp 16   Ht 5\' 6"  (1.676 m)   Wt (!) 302 lb (137 kg)   SpO2 98%   BMI 48.74 kg/m   General Appearance: No distress  Neuro:without focal findings, mental status, speech normal, alert and oriented HEENT: PERRLA, EOM intact Pulmonary: No wheezing, No rales  CardiovascularNormal S1,S2.  No m/r/g.  Abdomen: Benign, Soft, non-tender, No masses Renal:  No costovertebral tenderness  GU:  No performed at this time. Endoc: No evident thyromegaly, no signs of acromegaly or Cushing features Skin:   warm, no rashes, no ecchymosis  Extremities: normal, no cyanosis, clubbing.     LABORATORY PANEL:   CBC No results for input(s): WBC, HGB, HCT, PLT in the last 168 hours. ------------------------------------------------------------------------------------------------------------------  Chemistries  No results for input(s): NA, K, CL, CO2, GLUCOSE, BUN, CREATININE, CALCIUM, MG, AST, ALT, ALKPHOS, BILITOT in the last 168 hours.  Invalid input(s): GFRCGP ------------------------------------------------------------------------------------------------------------------  Cardiac Enzymes No results for input(s): TROPONINI in the last 168  hours. ------------------------------------------------------------  RADIOLOGY:   No results found for this or any previous visit. Results for orders placed during the hospital encounter of 03/14/17  DG Chest 2 View   Narrative CLINICAL DATA:  57 y/o  F; 1 week of chest pain and cough.  EXAM: CHEST  2 VIEW  COMPARISON:  02/07/2017 chest radiograph  FINDINGS: Stable normal cardiac silhouette. Mild bronchitic changes. No consolidation, effusion, or pneumothorax. Bones are unremarkable.  IMPRESSION: Mild bronchitic changes.  No focal consolidation.   Electronically Signed   By: Kristine Garbe M.D.   On: 03/14/2017 18:09    ------------------------------------------------------------------------------------------------------------------  Thank  you for allowing [facility] to assist in the care of your patient. Our recommendations are noted above.  Please contact us if we can be of further service.   Marda Stalker, M.D., F.C.C.P.  Board Certified in Internal Medicine, Pulmonary Medicine, [place], and Sleep Medicine.  [facility] Office Number: [phone]   04/24/2018

## 2018-04-25 ENCOUNTER — Ambulatory Visit (INDEPENDENT_AMBULATORY_CARE_PROVIDER_SITE_OTHER): Payer: Medicare Other | Admitting: Internal Medicine

## 2018-04-25 ENCOUNTER — Encounter: Payer: Self-pay | Admitting: Internal Medicine

## 2018-04-25 VITALS — BP 124/84 | HR 82 | Resp 16 | Ht 66.0 in | Wt 302.0 lb

## 2018-04-25 DIAGNOSIS — R0609 Other forms of dyspnea: Secondary | ICD-10-CM

## 2018-04-25 DIAGNOSIS — F17210 Nicotine dependence, cigarettes, uncomplicated: Secondary | ICD-10-CM | POA: Diagnosis not present

## 2018-04-25 DIAGNOSIS — J4540 Moderate persistent asthma, uncomplicated: Secondary | ICD-10-CM | POA: Diagnosis not present

## 2018-04-25 DIAGNOSIS — R0600 Dyspnea, unspecified: Secondary | ICD-10-CM

## 2018-04-25 NOTE — Patient Instructions (Signed)
--  Quitting smoking is the most important thing that you can do for your health.  --Quitting smoking will have greater affect on your health than any medicine that we can give you.   Use your wixela inhaler  twice daily and rinse mouth after use.  Use albuterol (proair) use as needed. You can also take 2 puffs before strenuous activity.  Work on weight loss, try to increase your exercise activity.

## 2018-05-19 ENCOUNTER — Ambulatory Visit
Admission: RE | Admit: 2018-05-19 | Discharge: 2018-05-19 | Disposition: A | Discharge status: Home / Self Care | Payer: Medicare Other | Source: Ambulatory Visit | Attending: Family Medicine | Admitting: Family Medicine

## 2018-05-19 DIAGNOSIS — Z1231 Encounter for screening mammogram for malignant neoplasm of breast: Secondary | ICD-10-CM | POA: Diagnosis not present

## 2018-05-23 ENCOUNTER — Other Ambulatory Visit: Payer: Self-pay | Admitting: Registered Nurse

## 2018-05-23 DIAGNOSIS — J45909 Unspecified asthma, uncomplicated: Secondary | ICD-10-CM

## 2018-05-23 DIAGNOSIS — R059 Cough, unspecified: Secondary | ICD-10-CM

## 2018-05-23 DIAGNOSIS — R05 Cough: Secondary | ICD-10-CM

## 2018-05-25 ENCOUNTER — Telehealth: Payer: Self-pay | Admitting: Student in an Organized Health Care Education/Training Program

## 2018-05-25 NOTE — Telephone Encounter (Signed)
Dr. Holley Raring responded with No and please discharge in Epic if not already done.

## 2018-05-25 NOTE — Telephone Encounter (Signed)
Patient states she has gotten help since she was released from clinic and would like to come back for tx here. Wants to apologize to Dr. Holley Raring. Would like to be seen as a patient again. Is this possible?

## 2018-08-11 ENCOUNTER — Encounter
Admission: RE | Disposition: A | Discharge status: Home / Self Care | Payer: Self-pay | Source: Home / Self Care | Attending: Gastroenterology

## 2018-08-11 ENCOUNTER — Ambulatory Visit
Admission: RE | Admit: 2018-08-11 | Discharge: 2018-08-11 | Disposition: A | Discharge status: Home / Self Care | Payer: Medicare Other | Attending: Gastroenterology | Admitting: Gastroenterology

## 2018-08-11 ENCOUNTER — Other Ambulatory Visit: Payer: Self-pay

## 2018-08-11 ENCOUNTER — Encounter: Payer: Self-pay | Admitting: *Deleted

## 2018-08-11 DIAGNOSIS — Z538 Procedure and treatment not carried out for other reasons: Secondary | ICD-10-CM | POA: Diagnosis not present

## 2018-08-11 DIAGNOSIS — R112 Nausea with vomiting, unspecified: Secondary | ICD-10-CM | POA: Insufficient documentation

## 2018-08-11 HISTORY — DX: Gastrointestinal hemorrhage, unspecified: K92.2

## 2018-08-11 HISTORY — DX: Other intervertebral disc degeneration, lumbar region: M51.36

## 2018-08-11 HISTORY — DX: Diverticulosis of intestine, part unspecified, without perforation or abscess without bleeding: K57.90

## 2018-08-11 HISTORY — DX: Unspecified ectopic pregnancy without intrauterine pregnancy: O00.90

## 2018-08-11 SURGERY — COLONOSCOPY WITH PROPOFOL
Anesthesia: General

## 2018-08-11 MED ORDER — SODIUM CHLORIDE 0.9 % IV SOLN
INTRAVENOUS | Status: DC
  Administered 2018-08-11: 13:00:00 via INTRAVENOUS

## 2018-08-11 NOTE — Progress Notes (Addendum)
Patient was brought in Melanie Bowen  due to not feeling well to giver her fluids. Dr. Rosanna Randy orthostatic blood pressures. The blood pressures were done and IV  fluids started. Patient stated she did not want to wait and said she wanted to leave. I said that was her choice and I would tell the doctor.  Talk to the doctor and he said it was her choice. I asked the patient if she was feeling better  And she said yel. Asked her again before taking out the IV if she was sure,she said yes. I took the IV out and let her get dressed. She left with her fiance.

## 2018-08-11 NOTE — H&P (Signed)
Patient is a 57 year old female that was to have a colonoscopy today.  She called late morning later that she was having difficulty with nausea and vomiting.  She complained that she was dizzy.  I brought her in early so that we could check for orthostatics and treat for possible dehydration as well as her nausea and vomiting.  Once she had an IV in and had a little bit of fluid she stated she felt better and did not want to stay for the colonoscopy.  Note she did eat solid food yesterday.  Office will touch with her in regards to rearranging.

## 2018-10-29 ENCOUNTER — Emergency Department
Admission: EM | Admit: 2018-10-29 | Discharge: 2018-10-29 | Disposition: A | Discharge status: Home / Self Care | Payer: Medicare Other | Attending: Emergency Medicine | Admitting: Emergency Medicine

## 2018-10-29 ENCOUNTER — Encounter: Payer: Self-pay | Admitting: Emergency Medicine

## 2018-10-29 ENCOUNTER — Other Ambulatory Visit: Payer: Self-pay

## 2018-10-29 DIAGNOSIS — M545 Low back pain, unspecified: Secondary | ICD-10-CM

## 2018-10-29 DIAGNOSIS — G8929 Other chronic pain: Secondary | ICD-10-CM | POA: Diagnosis not present

## 2018-10-29 LAB — URINALYSIS, COMPLETE (UACMP) WITH MICROSCOPIC
Bacteria, UA: NONE SEEN
Bilirubin Urine: NEGATIVE
Glucose, UA: NEGATIVE mg/dL
Hgb urine dipstick: NEGATIVE
Ketones, ur: NEGATIVE mg/dL
Leukocytes,Ua: NEGATIVE
Nitrite: NEGATIVE
Protein, ur: NEGATIVE mg/dL
Specific Gravity, Urine: 1.018 (ref 1.005–1.030)
pH: 6 (ref 5.0–8.0)

## 2018-10-29 MED ORDER — DEXAMETHASONE SODIUM PHOSPHATE 10 MG/ML IJ SOLN
10.0000 mg | Freq: Once | INTRAMUSCULAR | Status: AC
  Administered 2018-10-29: 10 mg via INTRAMUSCULAR
  Filled 2018-10-29: qty 1

## 2018-10-29 MED ORDER — HYDROCODONE-ACETAMINOPHEN 5-325 MG PO TABS: 1.0000 | ORAL_TABLET | Freq: Four times a day (QID) | ORAL | 0 refills | Status: DC | PRN

## 2018-10-29 NOTE — Discharge Instructions (Addendum)
You will need to follow-up with your primary care provider if any continued problems or need for pain medication.  Your medication was sent to [facility] as you requested.  You may also use ice or heat to your lower back as needed for discomfort.  Continue your regular medication as prescribed by your doctor.

## 2018-10-29 NOTE — ED Provider Notes (Signed)
[facility] Emergency Department Provider Note  ____________________________________________   First MD Initiated Contact with Patient 10/29/18 [phone]     (approximate)  I have reviewed the triage vital signs and the nursing notes.   HISTORY  Chief Complaint Back Pain   HPI Melanie Bowen is a 58 y.o. female presents to the ED with complaint of low back pain.  Patient has history of chronic back issues with intermittent episodes of intense pain per patient.  Patient denies any recent injury.  Patient states that last evening her low back pain was exacerbated and her current medication is not helping.  She denies any paresthesias, saddle anesthesias or incontinence of bowel or bladder.  There has been no history of urinary tract infections.  She states in the past she has had facet injections and currently is taking chronic pain medication.  Patient had back surgery 1 year ago and has not seen her surgeon since she was discharged from the practice.  She rates her pain as an 8 out of 10.     Past Medical History:  Diagnosis Date  . Acute GI hemorrhage 12/2015   required transfusion  . Allergic rhinitis   . Arthritis    DDD  . Asthma   . Blepharitis   . Bronchitis   . Cigarette smoker   . Claustrophobia   . COPD (chronic obstructive pulmonary disease) ([place])   . Cough   . DDD (degenerative disc disease), lumbar 04/10/2014  . Depression   . Diverticulosis   . Ectopic pregnancy   . Eye irritation   . Genital herpes   . GERD (gastroesophageal reflux disease)   . Headache   . HIV (human immunodeficiency virus infection) ([place])   . Hypertension   . Paresthesia of hand, bilateral   . Shortness of breath dyspnea    at times due to asthma    Patient Active Problem List   Diagnosis Date Noted  . Violation of controlled substance agreement 12/05/2017  . Positive urine drug screen (+cocaine) 12/05/2017  . Cervicalgia 12/05/2017  . Drug-seeking behavior  12/05/2017  . Community acquired pneumonia 11/28/2016  . Pneumonia 11/28/2016  . Allergic rhinitis 05/27/2016  . Tobacco abuse counseling 02/11/2016  . Symptomatic anemia   . Lower GI bleed   . Anemia 01/16/2016  . GI bleed 01/10/2016  . GERD (gastroesophageal reflux disease) 01/10/2016  . HTN (hypertension) 01/10/2016  . Depression 01/10/2016  . HIV (human immunodeficiency virus infection) ([place]) 01/10/2016  . Lumbar pseudoarthrosis 10/31/2015  . Chronic cough   . Cough 06/19/2015  . DDD (degenerative disc disease), lumbar 12/31/2014  . Degenerative disc disease, lumbar 12/31/2014  . Asthma, chronic 11/12/2014    Past Surgical History:  Procedure Laterality Date  . BACK SURGERY  12/2014   lumbar lam. Dr. Deri Fuelling  . BACK SURGERY  12/25/2012   Removal lumbosubarachnoid shunt system, L5-S1 TF ESI, Dr. Virl Axe Minchew  . back surgery may 2016     lumbar   . BUNIONECTOMY Bilateral    feet  . COLONOSCOPY  12/06/2014   rescheduled from 11/01/2014 due to poor prep  . DIAGNOSTIC LAPAROSCOPY    . DISTAL FEMUR OSTEOTOMY Right    Dr. Earnestine Leys  . ESOPHAGOGASTRODUODENOSCOPY Left 01/11/2016   Procedure: ESOPHAGOGASTRODUODENOSCOPY (EGD);  Surgeon: Hulen Luster, MD;  Location: [facility] ENDOSCOPY;  Service: Endoscopy;  Laterality: Left;  . Finger fracture Right    5th finger  . FRACTURE SURGERY Right 01/25/2014   closed  reduction & percutaneous pinning of 5th digit  . HAND SURGERY Bilateral    carpal tunnel  . JOINT REPLACEMENT Bilateral    knees  . KNEE SURGERY Bilateral 2003,2007   total Joint  . LAPAROSCOPY FOR ECTOPIC PREGNANCY    . PERIPHERAL VASCULAR CATHETERIZATION N/A 01/12/2016   Procedure: Visceral Angiography;  Surgeon: Algernon Huxley, MD;  Location: [facility];  Service: Cardiovascular;  Laterality: N/A;  . PERIPHERAL VASCULAR CATHETERIZATION N/A 01/12/2016   Procedure: Visceral Artery Intervention;  Surgeon: Algernon Huxley, MD;  Location: [facility];   Service: Cardiovascular;  Laterality: N/A;  . REVISION TOTAL KNEE ARTHROPLASTY Right 12/31/2002   Dr. Marry Guan  . TUBAL LIGATION    . VIDEO BRONCHOSCOPY N/A 06/30/2015   Procedure: VIDEO BRONCHOSCOPY WITHOUT FLUORO;  Surgeon: Vilinda Boehringer, MD;  Location: [facility] ORS;  Service: Cardiopulmonary;  Laterality: N/A;    Prior to Admission medications   Medication Sig Start Date End Date Taking? Authorizing Provider  albuterol (PROVENTIL HFA;VENTOLIN HFA) 108 (90 BASE) MCG/ACT inhaler Inhale 2 puffs into the lungs every 6 (six) hours as needed for wheezing or shortness of breath.     [provider]  amLODipine (NORVASC) 10 MG tablet Take 10 mg by mouth daily.     [provider]  Azelastine HCl 0.15 % SOLN Place 1 spray into both nostrils as needed. 02/23/16   [provider]  clobetasol ointment (TEMOVATE) 2.44 % Apply 1 application topically as needed. 11/15/16   [provider]  DEXILANT 60 MG capsule Take 60 mg by mouth daily.  04/14/17   [provider]  Fluticasone-Salmeterol (ADVAIR DISKUS) 500-50 MCG/DOSE AEPB Inhale 1 puff into the lungs 2 (two) times daily. 05/27/16   Mungal, Vishal, MD  GENVOYA 150-150-200-10 MG TABS tablet Take 1 tablet by mouth daily. 11/09/16   [provider]  hydrochlorothiazide (HYDRODIURIL) 25 MG tablet Take 25 mg by mouth daily.    [provider]  HYDROcodone-acetaminophen (NORCO) 5-325 MG tablet Take 1 tablet by mouth every 6 (six) hours as needed for moderate pain or severe pain. 10/29/18   Johnn Hai, PA-C  hydrOXYzine (ATARAX/VISTARIL) 25 MG tablet Take 25 mg by mouth 3 (three) times daily as needed.    [provider]  ibuprofen (ADVIL,MOTRIN) 600 MG tablet Take 1 tablet (600 mg total) by mouth every 8 (eight) hours as needed. 03/23/18   Darel Hong, MD  ipratropium-albuterol (DUONEB) 0.5-2.5 (3) MG/3ML SOLN Inhale 3 mLs into the lungs every 6 (six) hours as needed (for wheezing/shortness  of breath).     [provider]  lidocaine (LIDODERM) 5 % Place 1 patch onto the skin every 12 (twelve) hours. Remove & Discard patch within 12 hours or as directed by MD 03/19/18 03/19/19  Merlyn Lot, MD  losartan (COZAAR) 50 MG tablet Take 50 mg by mouth daily.    [provider]  meloxicam (MOBIC) 15 MG tablet Take 15 mg by mouth daily.    [provider]  naproxen (NAPROSYN) 500 MG tablet Take 1 tablet (500 mg total) by mouth 2 (two) times daily with a meal. 02/24/18 02/24/19  Nance Pear, MD  omeprazole (PRILOSEC) 40 MG capsule Take 40 mg by mouth daily.     [provider]  promethazine (PHENERGAN) 25 MG tablet Take 25 mg by mouth every 6 (six) hours as needed for nausea or vomiting.     [provider]  ranitidine (ZANTAC) 150 MG tablet Take 150  mg by mouth daily. 10/25/17   [provider]  traZODone (DESYREL) 100 MG tablet  02/14/17   [provider]  valACYclovir (VALTREX) 500 MG tablet Take 500 mg by mouth 2 (two) times daily.    [provider]    Allergies Morphine and related; Gabapentin; and Zanaflex  [tizanidine]  Family History  Problem Relation Age of Onset  . Breast cancer Sister     Social History Social History   Tobacco Use  . Smoking status: Current Some Day Smoker    Packs/day: 0.25    Years: 38.00    Pack years: 9.50    Types: Cigarettes  . Smokeless tobacco: Never Used  . Tobacco comment: 1 cig daily  Substance Use Topics  . Alcohol use: Yes    Alcohol/week: 5.0 standard drinks    Types: 5 Glasses of wine per week    Comment: wine - couple days of week  . Drug use: No    Comment: former    Review of Systems Constitutional: No fever/chills Cardiovascular: Denies chest pain. Respiratory: Denies shortness of breath. Gastrointestinal: No abdominal pain.  No nausea, no vomiting.  No diarrhea.   Genitourinary: Negative for dysuria. Musculoskeletal: Positive for acute and  chronic low back pain. Skin: Negative for rash. Neurological: Negative for headaches, focal weakness or numbness. ___________________________________________   PHYSICAL EXAM:  VITAL SIGNS: ED Triage Vitals  Enc Vitals Group     BP 10/29/18 0819 117/88     Pulse Rate 10/29/18 0819 85     Resp 10/29/18 0819 18     Temp 10/29/18 0819 97.6 F (36.4 C)     Temp Source 10/29/18 0819 Oral     SpO2 10/29/18 0819 98 %     Weight 10/29/18 0820 300 lb (136.1 kg)     Height 10/29/18 0820 5\' 6"  (1.676 m)     Head Circumference --      Peak Flow --      Pain Score 10/29/18 0819 8     Pain Loc --      Pain Edu? --      Excl. in [place]? --    Constitutional: Alert and oriented. Well appearing and in no acute distress. Eyes: Conjunctivae are normal.  Head: Atraumatic. Neck: No stridor.   Cardiovascular: Normal rate, regular rhythm. Grossly normal heart sounds.  Good peripheral circulation. Respiratory: Normal respiratory effort.  No retractions. Lungs CTAB. Gastrointestinal: Soft and nontender.  Moderately obese.  No distention.  No CVA tenderness. Musculoskeletal: On examination of the back there is a well-healed surgical scar in the sacral area.  There is point tenderness on palpation L5-S1 area and paravertebral muscles bilaterally.  Range of motion is limited.  Patient groans frequently with any movement.  Good muscle strength bilaterally.  Straight leg raises were difficult to assess secondary to patient's discomfort. Neurologic:  Normal speech and language. No gross focal neurologic deficits are appreciated.  Skin:  Skin is warm, dry and intact. No rash noted. Psychiatric: Mood and affect are normal. Speech and behavior are normal.  ____________________________________________   LABS (all labs ordered are listed, but only abnormal results are displayed)  Labs Reviewed  URINALYSIS, COMPLETE (UACMP) WITH MICROSCOPIC - Abnormal; Notable for the following components:      Result Value    Color, Urine YELLOW (*)    APPearance CLEAR (*)    All other components within normal limits     PROCEDURES  Procedure(s) performed (including Critical Care):  Procedures  ____________________________________________   INITIAL IMPRESSION / ASSESSMENT AND PLAN / ED COURSE  As part of my medical decision making, I reviewed the following data within the electronic MEDICAL RECORD NUMBER Notes from prior ED visits and [place] Controlled Substance Database     Patient presents to the ED with complaint of acute exacerbation of her chronic low back pain.  She denies any recent injury, incontinence of bowel or bladder or saddle anesthesias.  Patient has had facet injections in the past.  Patient was given Decadron 10 mg IM while in the department.  Patient was given a prescription for Norco 1 every 6 hours #8.  She is to contact her PCP or surgeon if any continued pain medication is indicated.  ____________________________________________   FINAL CLINICAL IMPRESSION(S) / ED DIAGNOSES  Final diagnoses:  Chronic midline low back pain without sciatica     ED Discharge Orders         Ordered    HYDROcodone-acetaminophen (NORCO) 5-325 MG tablet  Every 6 hours PRN     10/29/18 0937           Note:  This document was prepared using Dragon voice recognition software and may include unintentional dictation errors.    Johnn Hai, PA-C 10/29/18 1606    Nena Polio, MD 10/29/18 [phone]

## 2018-10-29 NOTE — ED Triage Notes (Signed)
PT arrives with complaints of lower back pain. Pt states she has chronic back pain with intermittent episodes of intense pain. Pt states this episode started last night. No loss of continence; no urinary complaints.

## 2018-11-16 ENCOUNTER — Other Ambulatory Visit: Payer: Self-pay

## 2018-11-16 ENCOUNTER — Emergency Department
Admission: EM | Admit: 2018-11-16 | Discharge: 2018-11-16 | Disposition: A | Discharge status: Home / Self Care | Payer: Medicare Other | Attending: Emergency Medicine | Admitting: Emergency Medicine

## 2018-11-16 ENCOUNTER — Encounter: Payer: Self-pay | Admitting: Emergency Medicine

## 2018-11-16 DIAGNOSIS — G8929 Other chronic pain: Secondary | ICD-10-CM | POA: Diagnosis not present

## 2018-11-16 DIAGNOSIS — J45909 Unspecified asthma, uncomplicated: Secondary | ICD-10-CM | POA: Insufficient documentation

## 2018-11-16 DIAGNOSIS — M545 Low back pain: Secondary | ICD-10-CM | POA: Diagnosis present

## 2018-11-16 DIAGNOSIS — I10 Essential (primary) hypertension: Secondary | ICD-10-CM | POA: Insufficient documentation

## 2018-11-16 DIAGNOSIS — F17210 Nicotine dependence, cigarettes, uncomplicated: Secondary | ICD-10-CM | POA: Diagnosis not present

## 2018-11-16 DIAGNOSIS — Z79899 Other long term (current) drug therapy: Secondary | ICD-10-CM | POA: Insufficient documentation

## 2018-11-16 DIAGNOSIS — J449 Chronic obstructive pulmonary disease, unspecified: Secondary | ICD-10-CM | POA: Diagnosis not present

## 2018-11-16 MED ORDER — HYDROCODONE-ACETAMINOPHEN 5-325 MG PO TABS: 1.0000 | ORAL_TABLET | Freq: Three times a day (TID) | ORAL | 0 refills | Status: AC | PRN

## 2018-11-16 MED ORDER — PREDNISONE 20 MG PO TABS: 20.0000 mg | ORAL_TABLET | Freq: Two times a day (BID) | ORAL | 0 refills | Status: AC

## 2018-11-16 MED ORDER — ORPHENADRINE CITRATE ER 100 MG PO TB12: 100.0000 mg | ORAL_TABLET | Freq: Two times a day (BID) | ORAL | 0 refills | Status: AC | PRN

## 2018-11-16 MED ORDER — KETOROLAC TROMETHAMINE 30 MG/ML IJ SOLN
30.0000 mg | Freq: Once | INTRAMUSCULAR | Status: AC
  Administered 2018-11-16: 30 mg via INTRAMUSCULAR
  Filled 2018-11-16: qty 1

## 2018-11-16 MED ORDER — ORPHENADRINE CITRATE 30 MG/ML IJ SOLN
60.0000 mg | INTRAMUSCULAR | Status: AC
  Administered 2018-11-16: 60 mg via INTRAMUSCULAR
  Filled 2018-11-16: qty 2

## 2018-11-16 NOTE — ED Notes (Signed)
See triage note  Presents with lower back pain   States she was scheduled for injection but appt was cancelled

## 2018-11-16 NOTE — ED Triage Notes (Signed)
Says back pain rad down both legs, but worse on left.  Was supposed to go to [facility] for injection, but they are not doing appt due to covid.

## 2018-11-16 NOTE — Discharge Instructions (Signed)
Your exam is essentially normal at this time. Take the prescription meds as directed. Follow-up with your provider for ongoing symptoms. Return to the ED as needed.

## 2018-11-16 NOTE — ED Provider Notes (Signed)
[facility] Emergency Department Provider Note ____________________________________________  Time seen: 1244  I have reviewed the triage vital signs and the nursing notes.  HISTORY  Chief Complaint  Back Pain  HPI Melanie Bowen is a 58 y.o. female presents to the ED for evaluation of her acute on chronic low back pain.  Patient reports she is a patient of pain management at [facility] system.  She was scheduled for a routine epidural steroid injection, but it was postponed until 12/28/18 due to elective procedures restrictions. She denies any interim falls, bladder or bowel incontinence, or distal paresthesias.   Past Medical History:  Diagnosis Date  . Acute GI hemorrhage 12/2015   required transfusion  . Allergic rhinitis   . Arthritis    DDD  . Asthma   . Blepharitis   . Bronchitis   . Cigarette smoker   . Claustrophobia   . COPD (chronic obstructive pulmonary disease) ([place])   . Cough   . DDD (degenerative disc disease), lumbar 04/10/2014  . Depression   . Diverticulosis   . Ectopic pregnancy   . Eye irritation   . Genital herpes   . GERD (gastroesophageal reflux disease)   . Headache   . HIV (human immunodeficiency virus infection) ([place])   . Hypertension   . Paresthesia of hand, bilateral   . Shortness of breath dyspnea    at times due to asthma    Patient Active Problem List   Diagnosis Date Noted  . Violation of controlled substance agreement 12/05/2017  . Positive urine drug screen (+cocaine) 12/05/2017  . Cervicalgia 12/05/2017  . Drug-seeking behavior 12/05/2017  . Community acquired pneumonia 11/28/2016  . Pneumonia 11/28/2016  . Allergic rhinitis 05/27/2016  . Tobacco abuse counseling 02/11/2016  . Symptomatic anemia   . Lower GI bleed   . Anemia 01/16/2016  . GI bleed 01/10/2016  . GERD (gastroesophageal reflux disease) 01/10/2016  . HTN (hypertension) 01/10/2016  . Depression 01/10/2016  . HIV (human immunodeficiency  virus infection) ([place]) 01/10/2016  . Lumbar pseudoarthrosis 10/31/2015  . Chronic cough   . Cough 06/19/2015  . DDD (degenerative disc disease), lumbar 12/31/2014  . Degenerative disc disease, lumbar 12/31/2014  . Asthma, chronic 11/12/2014    Past Surgical History:  Procedure Laterality Date  . BACK SURGERY  12/2014   lumbar lam. Dr. Deri Fuelling  . BACK SURGERY  12/25/2012   Removal lumbosubarachnoid shunt system, L5-S1 TF ESI, Dr. Virl Axe Minchew  . back surgery may 2016     lumbar   . BUNIONECTOMY Bilateral    feet  . COLONOSCOPY  12/06/2014   rescheduled from 11/01/2014 due to poor prep  . DIAGNOSTIC LAPAROSCOPY    . DISTAL FEMUR OSTEOTOMY Right    Dr. Earnestine Leys  . ESOPHAGOGASTRODUODENOSCOPY Left 01/11/2016   Procedure: ESOPHAGOGASTRODUODENOSCOPY (EGD);  Surgeon: Hulen Luster, MD;  Location: [facility] ENDOSCOPY;  Service: Endoscopy;  Laterality: Left;  . Finger fracture Right    5th finger  . FRACTURE SURGERY Right 01/25/2014   closed reduction & percutaneous pinning of 5th digit  . HAND SURGERY Bilateral    carpal tunnel  . JOINT REPLACEMENT Bilateral    knees  . KNEE SURGERY Bilateral 2003,2007   total Joint  . LAPAROSCOPY FOR ECTOPIC PREGNANCY    . PERIPHERAL VASCULAR CATHETERIZATION N/A 01/12/2016   Procedure: Visceral Angiography;  Surgeon: Algernon Huxley, MD;  Location: [place] CV LAB;  Service: Cardiovascular;  Laterality: N/A;  . PERIPHERAL VASCULAR CATHETERIZATION  N/A 01/12/2016   Procedure: Visceral Artery Intervention;  Surgeon: Algernon Huxley, MD;  Location: [place] CV LAB;  Service: Cardiovascular;  Laterality: N/A;  . REVISION TOTAL KNEE ARTHROPLASTY Right 12/31/2002   Dr. Marry Guan  . TUBAL LIGATION    . VIDEO BRONCHOSCOPY N/A 06/30/2015   Procedure: VIDEO BRONCHOSCOPY WITHOUT FLUORO;  Surgeon: Vilinda Boehringer, MD;  Location: [facility] ORS;  Service: Cardiopulmonary;  Laterality: N/A;    Prior to Admission medications   Medication Sig Start Date End Date  Taking? Authorizing Provider  albuterol (PROVENTIL HFA;VENTOLIN HFA) 108 (90 BASE) MCG/ACT inhaler Inhale 2 puffs into the lungs every 6 (six) hours as needed for wheezing or shortness of breath.     [provider]  amLODipine (NORVASC) 10 MG tablet Take 10 mg by mouth daily.     [provider]  Azelastine HCl 0.15 % SOLN Place 1 spray into both nostrils as needed. 02/23/16   [provider]  clobetasol ointment (TEMOVATE) 1.02 % Apply 1 application topically as needed. 11/15/16   [provider]  DEXILANT 60 MG capsule Take 60 mg by mouth daily.  04/14/17   [provider]  Fluticasone-Salmeterol (ADVAIR DISKUS) 500-50 MCG/DOSE AEPB Inhale 1 puff into the lungs 2 (two) times daily. 05/27/16   Mungal, Vishal, MD  GENVOYA 150-150-200-10 MG TABS tablet Take 1 tablet by mouth daily. 11/09/16   [provider]  hydrochlorothiazide (HYDRODIURIL) 25 MG tablet Take 25 mg by mouth daily.    [provider]  HYDROcodone-acetaminophen (NORCO) 5-325 MG tablet Take 1 tablet by mouth 3 (three) times daily as needed for up to 5 days. 11/16/18 11/21/18  Alfie Rideaux, Dannielle Karvonen, PA-C  hydrOXYzine (ATARAX/VISTARIL) 25 MG tablet Take 25 mg by mouth 3 (three) times daily as needed.    [provider]  ibuprofen (ADVIL,MOTRIN) 600 MG tablet Take 1 tablet (600 mg total) by mouth every 8 (eight) hours as needed. 03/23/18   Darel Hong, MD  ipratropium-albuterol (DUONEB) 0.5-2.5 (3) MG/3ML SOLN Inhale 3 mLs into the lungs every 6 (six) hours as needed (for wheezing/shortness of breath).     [provider]  lidocaine (LIDODERM) 5 % Place 1 patch onto the skin every 12 (twelve) hours. Remove & Discard patch within 12 hours or as directed by MD 03/19/18 03/19/19  Merlyn Lot, MD  losartan (COZAAR) 50 MG tablet Take 50 mg by mouth daily.    [provider]  meloxicam (MOBIC) 15 MG tablet Take 15 mg by mouth daily.    [provider]  naproxen (NAPROSYN) 500 MG tablet Take 1 tablet (500 mg total) by mouth 2 (two) times daily with a meal. 02/24/18 02/24/19  Nance Pear, MD  omeprazole (PRILOSEC) 40 MG capsule Take 40 mg by mouth daily.     [provider]  orphenadrine (NORFLEX) 100 MG tablet Take 1 tablet (100 mg total) by mouth 2 (two) times daily as needed for up to 15 days for muscle spasms. 11/16/18 12/01/18  Elin Seats, Dannielle Karvonen, PA-C  predniSONE (DELTASONE) 20 MG tablet Take 1 tablet (20 mg total) by mouth 2 (two) times daily with a meal for 10 days. 11/16/18 11/26/18  Audryna Wendt, Dannielle Karvonen, PA-C  promethazine (PHENERGAN) 25 MG tablet Take 25 mg by mouth every 6 (six) hours as needed for nausea or vomiting.     [provider]  traZODone (DESYREL) 100 MG tablet  02/14/17   [provider]  valACYclovir (VALTREX) 500 MG tablet  Take 500 mg by mouth 2 (two) times daily.    [provider]    Allergies Morphine and related; Gabapentin; and Zanaflex  [tizanidine]  Family History  Problem Relation Age of Onset  . Breast cancer Sister     Social History Social History   Tobacco Use  . Smoking status: Current Some Day Smoker    Packs/day: 0.25    Years: 38.00    Pack years: 9.50    Types: Cigarettes  . Smokeless tobacco: Never Used  . Tobacco comment: 1 cig daily  Substance Use Topics  . Alcohol use: Yes    Alcohol/week: 5.0 standard drinks    Types: 5 Glasses of wine per week    Comment: wine - couple days of week  . Drug use: No    Comment: former    Review of Systems  Constitutional: Negative for fever. Cardiovascular: Negative for chest pain. Respiratory: Negative for shortness of breath. Gastrointestinal: Negative for abdominal pain, vomiting and diarrhea. Genitourinary: Negative for dysuria. Musculoskeletal: Positive for back pain. Skin: Negative for rash. Neurological: Negative for headaches, focal weakness or  numbness. ____________________________________________  PHYSICAL EXAM:  VITAL SIGNS: ED Triage Vitals  Enc Vitals Group     BP 11/16/18 1222 124/62     Pulse Rate 11/16/18 1222 74     Resp 11/16/18 1222 18     Temp 11/16/18 1222 98.4 F (36.9 C)     Temp Source 11/16/18 1222 Oral     SpO2 11/16/18 1222 98 %     Weight 11/16/18 1215 286 lb 9.6 oz (130 kg)     Height 11/16/18 1215 5\' 6"  (1.676 m)     Head Circumference --      Peak Flow --      Pain Score 11/16/18 1214 9     Pain Loc --      Pain Edu? --      Excl. in [place]? --     Constitutional: Alert and oriented. Well appearing and in no distress. Head: Normocephalic and atraumatic. Eyes: Conjunctivae are normal. Normal extraocular movements Cardiovascular: Normal rate, regular rhythm. Normal distal pulses. Respiratory: Normal respiratory effort. No wheezes/rales/rhonchi. Gastrointestinal: Soft and nontender. No distention. Musculoskeletal: Nontender with normal range of motion in all extremities.  Neurologic: CN II-XII grossly intact. LE DTRs 2+ bilaterally. Normal gait without ataxia. Normal speech and language. No gross focal neurologic deficits are appreciated. Skin:  Skin is warm, dry and intact. No rash noted. ____________________________________________  PROCEDURES  Procedures Toradol 30 mg IM Norflex 60 mg IM ____________________________________________  INITIAL IMPRESSION / ASSESSMENT AND PLAN / ED COURSE  Patient with ED evaluation of acute on chronic low back pain. Her exam is reassuring. She reports improvement with ED medicine administration. She will be discharged with prescriptions for Norflex, Prednisone, and Norco #15. She will follow-up with her primary provider or pain management specialist as needed.  ____________________________________________  FINAL CLINICAL IMPRESSION(S) / ED DIAGNOSES  Final diagnoses:  Acute exacerbation of chronic low back pain      Carmie End, Dannielle Karvonen,  PA-C 11/16/18 1938    Lavonia Drafts, MD 11/20/18 [phone]

## 2018-12-02 ENCOUNTER — Emergency Department
Admission: EM | Admit: 2018-12-02 | Discharge: 2018-12-02 | Disposition: A | Discharge status: Home / Self Care | Payer: Medicare Other | Attending: Emergency Medicine | Admitting: Emergency Medicine

## 2018-12-02 ENCOUNTER — Other Ambulatory Visit: Payer: Self-pay

## 2018-12-02 DIAGNOSIS — J449 Chronic obstructive pulmonary disease, unspecified: Secondary | ICD-10-CM | POA: Insufficient documentation

## 2018-12-02 DIAGNOSIS — Z79899 Other long term (current) drug therapy: Secondary | ICD-10-CM | POA: Diagnosis not present

## 2018-12-02 DIAGNOSIS — Z96652 Presence of left artificial knee joint: Secondary | ICD-10-CM | POA: Insufficient documentation

## 2018-12-02 DIAGNOSIS — Z96651 Presence of right artificial knee joint: Secondary | ICD-10-CM | POA: Diagnosis not present

## 2018-12-02 DIAGNOSIS — I10 Essential (primary) hypertension: Secondary | ICD-10-CM | POA: Diagnosis not present

## 2018-12-02 DIAGNOSIS — G8929 Other chronic pain: Secondary | ICD-10-CM

## 2018-12-02 DIAGNOSIS — B20 Human immunodeficiency virus [HIV] disease: Secondary | ICD-10-CM | POA: Insufficient documentation

## 2018-12-02 DIAGNOSIS — F17210 Nicotine dependence, cigarettes, uncomplicated: Secondary | ICD-10-CM | POA: Diagnosis not present

## 2018-12-02 DIAGNOSIS — M545 Low back pain: Secondary | ICD-10-CM | POA: Insufficient documentation

## 2018-12-02 MED ORDER — KETOROLAC TROMETHAMINE 30 MG/ML IJ SOLN
30.0000 mg | Freq: Once | INTRAMUSCULAR | Status: AC
  Administered 2018-12-02: 30 mg via INTRAMUSCULAR
  Filled 2018-12-02: qty 1

## 2018-12-02 MED ORDER — HYDROCODONE-ACETAMINOPHEN 5-325 MG PO TABS: 1.0000 | ORAL_TABLET | ORAL | 0 refills | Status: DC | PRN

## 2018-12-02 MED ORDER — ORPHENADRINE CITRATE 30 MG/ML IJ SOLN
60.0000 mg | Freq: Two times a day (BID) | INTRAMUSCULAR | Status: DC
  Administered 2018-12-02: 60 mg via INTRAMUSCULAR
  Filled 2018-12-02: qty 2

## 2018-12-02 NOTE — ED Provider Notes (Signed)
[facility] Emergency Department Provider Note ____________________________________________  Time seen: Approximately 1:06 PM  I have reviewed the triage vital signs and the nursing notes.  HISTORY  Chief Complaint Back Pain   HPI Melanie Bowen is a 58 y.o. female who presents to the emergency department for pain management. She is experiencing acute on chronic lower back pain. No recent injury or change in quality or location. She is scheduled for an epidural injection on April 30. No relief with heating pad, OTC muscle cream, or OTC medications.  Past Medical History:  Diagnosis Date  . Acute GI hemorrhage 12/2015   required transfusion  . Allergic rhinitis   . Arthritis    DDD  . Asthma   . Blepharitis   . Bronchitis   . Cigarette smoker   . Claustrophobia   . COPD (chronic obstructive pulmonary disease) ([place])   . Cough   . DDD (degenerative disc disease), lumbar 04/10/2014  . Depression   . Diverticulosis   . Ectopic pregnancy   . Eye irritation   . Genital herpes   . GERD (gastroesophageal reflux disease)   . Headache   . HIV (human immunodeficiency virus infection) ([place])   . Hypertension   . Paresthesia of hand, bilateral   . Shortness of breath dyspnea    at times due to asthma    Patient Active Problem List   Diagnosis Date Noted  . Violation of controlled substance agreement 12/05/2017  . Positive urine drug screen (+cocaine) 12/05/2017  . Cervicalgia 12/05/2017  . Drug-seeking behavior 12/05/2017  . Community acquired pneumonia 11/28/2016  . Pneumonia 11/28/2016  . Allergic rhinitis 05/27/2016  . Tobacco abuse counseling 02/11/2016  . Symptomatic anemia   . Lower GI bleed   . Anemia 01/16/2016  . GI bleed 01/10/2016  . GERD (gastroesophageal reflux disease) 01/10/2016  . HTN (hypertension) 01/10/2016  . Depression 01/10/2016  . HIV (human immunodeficiency virus infection) ([place]) 01/10/2016  . Lumbar pseudoarthrosis  10/31/2015  . Chronic cough   . Cough 06/19/2015  . DDD (degenerative disc disease), lumbar 12/31/2014  . Degenerative disc disease, lumbar 12/31/2014  . Asthma, chronic 11/12/2014    Past Surgical History:  Procedure Laterality Date  . BACK SURGERY  12/2014   lumbar lam. Dr. Deri Fuelling  . BACK SURGERY  12/25/2012   Removal lumbosubarachnoid shunt system, L5-S1 TF ESI, Dr. Virl Axe Minchew  . back surgery may 2016     lumbar   . BUNIONECTOMY Bilateral    feet  . COLONOSCOPY  12/06/2014   rescheduled from 11/01/2014 due to poor prep  . DIAGNOSTIC LAPAROSCOPY    . DISTAL FEMUR OSTEOTOMY Right    Dr. Earnestine Leys  . ESOPHAGOGASTRODUODENOSCOPY Left 01/11/2016   Procedure: ESOPHAGOGASTRODUODENOSCOPY (EGD);  Surgeon: Hulen Luster, MD;  Location: [facility] ENDOSCOPY;  Service: Endoscopy;  Laterality: Left;  . Finger fracture Right    5th finger  . FRACTURE SURGERY Right 01/25/2014   closed reduction & percutaneous pinning of 5th digit  . HAND SURGERY Bilateral    carpal tunnel  . JOINT REPLACEMENT Bilateral    knees  . KNEE SURGERY Bilateral 2003,2007   total Joint  . LAPAROSCOPY FOR ECTOPIC PREGNANCY    . PERIPHERAL VASCULAR CATHETERIZATION N/A 01/12/2016   Procedure: Visceral Angiography;  Surgeon: Algernon Huxley, MD;  Location: [place] CV LAB;  Service: Cardiovascular;  Laterality: N/A;  . PERIPHERAL VASCULAR CATHETERIZATION N/A 01/12/2016   Procedure: Visceral Artery Intervention;  Surgeon: Erskine Squibb  [name], MD;  Location: [place] CV LAB;  Service: Cardiovascular;  Laterality: N/A;  . REVISION TOTAL KNEE ARTHROPLASTY Right 12/31/2002   Dr. Marry Guan  . TUBAL LIGATION    . VIDEO BRONCHOSCOPY N/A 06/30/2015   Procedure: VIDEO BRONCHOSCOPY WITHOUT FLUORO;  Surgeon: Vilinda Boehringer, MD;  Location: [facility] ORS;  Service: Cardiopulmonary;  Laterality: N/A;    Prior to Admission medications   Medication Sig Start Date End Date Taking? Authorizing Provider  albuterol (PROVENTIL HFA;VENTOLIN  HFA) 108 (90 BASE) MCG/ACT inhaler Inhale 2 puffs into the lungs every 6 (six) hours as needed for wheezing or shortness of breath.     [provider]  amLODipine (NORVASC) 10 MG tablet Take 10 mg by mouth daily.     [provider]  Azelastine HCl 0.15 % SOLN Place 1 spray into both nostrils as needed. 02/23/16   [provider]  clobetasol ointment (TEMOVATE) 0.62 % Apply 1 application topically as needed. 11/15/16   [provider]  DEXILANT 60 MG capsule Take 60 mg by mouth daily.  04/14/17   [provider]  Fluticasone-Salmeterol (ADVAIR DISKUS) 500-50 MCG/DOSE AEPB Inhale 1 puff into the lungs 2 (two) times daily. 05/27/16   Mungal, Vishal, MD  GENVOYA 150-150-200-10 MG TABS tablet Take 1 tablet by mouth daily. 11/09/16   [provider]  hydrochlorothiazide (HYDRODIURIL) 25 MG tablet Take 25 mg by mouth daily.    [provider]  HYDROcodone-acetaminophen (NORCO/VICODIN) 5-325 MG tablet Take 1 tablet by mouth every 4 (four) hours as needed for moderate pain. 12/02/18 12/02/19  Baptiste Littler, Johnette Abraham B, FNP  hydrOXYzine (ATARAX/VISTARIL) 25 MG tablet Take 25 mg by mouth 3 (three) times daily as needed.    [provider]  ibuprofen (ADVIL,MOTRIN) 600 MG tablet Take 1 tablet (600 mg total) by mouth every 8 (eight) hours as needed. 03/23/18   Darel Hong, MD  ipratropium-albuterol (DUONEB) 0.5-2.5 (3) MG/3ML SOLN Inhale 3 mLs into the lungs every 6 (six) hours as needed (for wheezing/shortness of breath).     [provider]  lidocaine (LIDODERM) 5 % Place 1 patch onto the skin every 12 (twelve) hours. Remove & Discard patch within 12 hours or as directed by MD 03/19/18 03/19/19  Merlyn Lot, MD  losartan (COZAAR) 50 MG tablet Take 50 mg by mouth daily.    [provider]  meloxicam (MOBIC) 15 MG tablet Take 15 mg by mouth daily.    [provider]  naproxen (NAPROSYN) 500 MG tablet Take 1 tablet (500 mg  total) by mouth 2 (two) times daily with a meal. 02/24/18 02/24/19  Nance Pear, MD  omeprazole (PRILOSEC) 40 MG capsule Take 40 mg by mouth daily.     [provider]  promethazine (PHENERGAN) 25 MG tablet Take 25 mg by mouth every 6 (six) hours as needed for nausea or vomiting.     [provider]  traZODone (DESYREL) 100 MG tablet  02/14/17   [provider]  valACYclovir (VALTREX) 500 MG tablet Take 500 mg by mouth 2 (two) times daily.    [provider]    Allergies Morphine and related; Gabapentin; and Zanaflex  [tizanidine]  Family History  Problem Relation Age of Onset  . Breast cancer Sister     Social History Social History   Tobacco Use  . Smoking status: Current Some Day Smoker    Packs/day: 0.25    Years: 38.00    Pack years: 9.50    Types: Cigarettes  .  Smokeless tobacco: Never Used  . Tobacco comment: 1 cig daily  Substance Use Topics  . Alcohol use: Yes    Alcohol/week: 5.0 standard drinks    Types: 5 Glasses of wine per week    Comment: wine - couple days of week  . Drug use: No    Comment: former    Review of Systems Constitutional: Well appearing. Respiratory: Negative for dyspnea. Cardiovascular: Negative for change in skin temperature or color. Musculoskeletal:   Negative for chronic steroid use   Negative for trauma in the presence of osteoporosis  Negative for age over 24 and trauma.  Negative for constitutional symptoms, or history of cancer  Negative for pain worse at night. Skin: Negative for rash, lesion, or wound.  Genitourinary: Negative for urinary retention. Rectal: Negative for fecal incontinence or new onset constipation/bowel habit changes. Hematological/Immunilogical: Negative for immunosuppression, IV drug use, or fever Neurological: Negataive for burning, tingling, numb, electric, radiating pain in the extremities.                        Negative for saddle anesthesia.                         Negative for focal neurologic deficit, progressive or disabling symptoms             Negative for saddle anesthesia. ____________________________________________   PHYSICAL EXAM:  VITAL SIGNS: ED Triage Vitals  Enc Vitals Group     BP 12/02/18 1106 132/64     Pulse Rate 12/02/18 1105 88     Resp 12/02/18 1105 16     Temp 12/02/18 1105 98.2 F (36.8 C)     Temp Source 12/02/18 1105 Oral     SpO2 12/02/18 1105 98 %     Weight 12/02/18 1105 299 lb (135.6 kg)     Height 12/02/18 1105 5\' 6"  (1.676 m)     Head Circumference --      Peak Flow --      Pain Score 12/02/18 1105 10     Pain Loc --      Pain Edu? --      Excl. in [place]? --     Constitutional: Alert and oriented. Well appearing and in no acute distress. Eyes: Conjunctivae are clear without discharge or drainage.  Head: Atraumatic. Neck: Full, active range of motion. Respiratory: Respirations even and unlabored. Musculoskeletal: Limited ROM of the back secondary to pain. FROM of extremities, Strength 5/5 of the lower extremities as tested. Neurologic: Reflexes of the lower extremities are 2+. Negative straight leg raise on the right or left side. Skin: Atraumatic.  Psychiatric: Behavior and affect are normal.  ____________________________________________   LABS (all labs ordered are listed, but only abnormal results are displayed)  Labs Reviewed - No data to display ____________________________________________  RADIOLOGY  Not indicated. ____________________________________________   PROCEDURES  Procedure(s) performed:  Procedures ____________________________________________   INITIAL IMPRESSION / ASSESSMENT AND PLAN / ED COURSE  Melanie Bowen is a 58 y.o. female who presents to the ER for treatment of acute on chronic back pain. She was given Norflex and Toradol while here with some relief. She is to keep her scheduled appointment. She was advised to return to the ER for symptoms that change or worsen if  unable to schedule an earlier appointment.  Medications  orphenadrine (NORFLEX) injection 60 mg (60 mg Intramuscular Given 12/02/18 1125)  ketorolac (TORADOL) 30 MG/ML injection 30  mg (30 mg Intramuscular Given 12/02/18 1125)    ED Discharge Orders         Ordered    HYDROcodone-acetaminophen (NORCO/VICODIN) 5-325 MG tablet  Every 4 hours PRN     12/02/18 1140           Pertinent labs & imaging results that were available during my care of the patient were reviewed by me and considered in my medical decision making (see chart for details).  _________________________________________   FINAL CLINICAL IMPRESSION(S) / ED DIAGNOSES  Final diagnoses:  Acute exacerbation of chronic low back pain     If controlled substance prescribed during this visit, 12 month history viewed on the [place] prior to issuing an initial prescription for Schedule II or III opiod.   Victorino Dike, FNP 12/02/18 1310    Harvest Dark, MD 12/02/18 1353

## 2018-12-02 NOTE — ED Triage Notes (Signed)
Pt states chronic back pain. A&O, in wheelchair. Denies recent fall. "I was supposed to get my shots in [place] but they put it off until the 30th." states has been using bengay, patches, and taking tylenol at home.

## 2018-12-05 ENCOUNTER — Encounter: Payer: Self-pay | Admitting: Emergency Medicine

## 2018-12-05 ENCOUNTER — Emergency Department
Admission: EM | Admit: 2018-12-05 | Discharge: 2018-12-05 | Disposition: A | Discharge status: Home / Self Care | Payer: Medicare Other | Attending: Emergency Medicine | Admitting: Emergency Medicine

## 2018-12-05 ENCOUNTER — Other Ambulatory Visit: Payer: Self-pay

## 2018-12-05 DIAGNOSIS — Z96653 Presence of artificial knee joint, bilateral: Secondary | ICD-10-CM | POA: Insufficient documentation

## 2018-12-05 DIAGNOSIS — I10 Essential (primary) hypertension: Secondary | ICD-10-CM | POA: Diagnosis not present

## 2018-12-05 DIAGNOSIS — B20 Human immunodeficiency virus [HIV] disease: Secondary | ICD-10-CM | POA: Insufficient documentation

## 2018-12-05 DIAGNOSIS — R52 Pain, unspecified: Secondary | ICD-10-CM

## 2018-12-05 DIAGNOSIS — M549 Dorsalgia, unspecified: Secondary | ICD-10-CM | POA: Diagnosis not present

## 2018-12-05 DIAGNOSIS — F17210 Nicotine dependence, cigarettes, uncomplicated: Secondary | ICD-10-CM | POA: Diagnosis not present

## 2018-12-05 DIAGNOSIS — J449 Chronic obstructive pulmonary disease, unspecified: Secondary | ICD-10-CM | POA: Insufficient documentation

## 2018-12-05 DIAGNOSIS — Z79899 Other long term (current) drug therapy: Secondary | ICD-10-CM | POA: Diagnosis not present

## 2018-12-05 DIAGNOSIS — G8929 Other chronic pain: Secondary | ICD-10-CM | POA: Insufficient documentation

## 2018-12-05 MED ORDER — ORPHENADRINE CITRATE 30 MG/ML IJ SOLN
60.0000 mg | INTRAMUSCULAR | Status: AC
  Administered 2018-12-05: 60 mg via INTRAMUSCULAR
  Filled 2018-12-05: qty 2

## 2018-12-05 MED ORDER — KETOROLAC TROMETHAMINE 30 MG/ML IJ SOLN
60.0000 mg | Freq: Once | INTRAMUSCULAR | Status: AC
  Administered 2018-12-05: 60 mg via INTRAMUSCULAR
  Filled 2018-12-05: qty 2

## 2018-12-05 NOTE — ED Triage Notes (Signed)
Pt here for her chronic back. Supposed to have had injection at [facility] for pain but cancelled because of COVID and needs something for pain until she can see them.

## 2018-12-05 NOTE — Discharge Instructions (Addendum)
Emergency care providers appreciate that many patients coming to us are in severe pain and we wish to address their pain in the safest, most responsible manner.  It is important to recognize, however, that the proper treatment of chronic pain differs from that of the pain of injuries and acute illnesses.  Our goal is to provider quality, safe, personalized care and we thank you for giving us the opportunity to serve you.  The use of narcotics and related agents for chronic pain syndromes may lead to additional physical and psychological problems.  Nearly as many people die from prescription narcotics each year as die from car crashes.  Additionally, this risk is increased if such prescriptions are obtained from a variety of sources.  Therefore, only your primary care physician or a pain management specialist is able to safely treat such syndromes with narcotic medications long-term.  Documentation revealing such prescriptions have been sought from multiple sources may prohibit us from providing a refill or different narcotic medication.  Your name may be checked first through the [place] Controlled Substances Reporting System.  This database is a record of controlled substance medication prescriptions that the patient has received.  This has been established by [place] in an effort to eliminate the dangerous, and often life-threatening, practice of obtaining multiple prescriptions from different medical providers.  If you have a chronic pain syndrome (i.e. chronic headaches, recurrent back or neck pain, dental pain, abdominal or pelvic pain without a specific diagnosis, or neuropathic pain such as fibromyalgia) or recurrent visits for the same condition without an acute diagnosis, you may be treated with non-narcotics and other non-addictive medicines.  Allergic reactions or negative side effects that may be reported by a patient to such medications will not typically lead to the use of a  narcotic analgesic or other controlled substance as an alternative.  Patients managing chronic pain with a personal physician should have provisions in place for breakthrough pain.  If you are in crisis, you should call your physician.  If your physician directs you to the emergency department, please have the doctor call and speak to our attending physician concerning your care.  When patients come to the Emergency Department (ED) with acute medical conditions in which the ED physician feels it is appropriate to prescribe narcotic or sedating pain medication, the physician will prescribe these in very limited quantities.  The amount of these medications will last only until you can see your primary care physician in his/her office.  Any patient who returns to the ED seeking refills should expect only non-narcotic pain medications.  In the event an acute medical condition exists and the emergency physician feels it is necessary that the patient be given a narcotic or sedating medication, a responsible adult driver should be present in the room prior to the medication being given by the nurse.  Prescriptions for narcotic or sedating medications that have been lost, stolen, or expired will NOT be refilled in the ED.  Patients who have chronic pain may receive non-narcotic prescriptions until seen by their primary care physician.  It is every patient's personal responsibility to maintain active prescriptions with his or her primary care physician or specialist.  

## 2018-12-05 NOTE — ED Provider Notes (Signed)
[facility] Emergency Department Provider Note ____________________________________________  Time seen: 1557  I have reviewed the triage vital signs and the nursing notes.  HISTORY  Chief Complaint  Back Pain  HPI Melanie Bowen is a 58 y.o. female with a history of chronic low back pain, DDD, tension, HIV, returns to the ED for ongoing chronic low back pain.  Patient was seen in the ED twice in the last 4 weeks.  She is also had an interim visit with the primary provider.  Each time she was provided with narcotic pain medicines.  She admits that this time that she did not feel the previously prescribed steroid given to her on 11/16/18.  She denies any interim injury, fall, or trauma.  She also denies any distal paresthesias, saddle anesthesias, foot drop, or incontinence.  Past Medical History:  Diagnosis Date  . Acute GI hemorrhage 12/2015   required transfusion  . Allergic rhinitis   . Arthritis    DDD  . Asthma   . Blepharitis   . Bronchitis   . Cigarette smoker   . Claustrophobia   . COPD (chronic obstructive pulmonary disease) ([place])   . Cough   . DDD (degenerative disc disease), lumbar 04/10/2014  . Depression   . Diverticulosis   . Ectopic pregnancy   . Eye irritation   . Genital herpes   . GERD (gastroesophageal reflux disease)   . Headache   . HIV (human immunodeficiency virus infection) ([place])   . Hypertension   . Paresthesia of hand, bilateral   . Shortness of breath dyspnea    at times due to asthma    Patient Active Problem List   Diagnosis Date Noted  . Violation of controlled substance agreement 12/05/2017  . Positive urine drug screen (+cocaine) 12/05/2017  . Cervicalgia 12/05/2017  . Drug-seeking behavior 12/05/2017  . Community acquired pneumonia 11/28/2016  . Pneumonia 11/28/2016  . Allergic rhinitis 05/27/2016  . Tobacco abuse counseling 02/11/2016  . Symptomatic anemia   . Lower GI bleed   . Anemia 01/16/2016  . GI  bleed 01/10/2016  . GERD (gastroesophageal reflux disease) 01/10/2016  . HTN (hypertension) 01/10/2016  . Depression 01/10/2016  . HIV (human immunodeficiency virus infection) ([place]) 01/10/2016  . Lumbar pseudoarthrosis 10/31/2015  . Chronic cough   . Cough 06/19/2015  . DDD (degenerative disc disease), lumbar 12/31/2014  . Degenerative disc disease, lumbar 12/31/2014  . Asthma, chronic 11/12/2014    Past Surgical History:  Procedure Laterality Date  . BACK SURGERY  12/2014   lumbar lam. Dr. Deri Fuelling  . BACK SURGERY  12/25/2012   Removal lumbosubarachnoid shunt system, L5-S1 TF ESI, Dr. Virl Axe Minchew  . back surgery may 2016     lumbar   . BUNIONECTOMY Bilateral    feet  . COLONOSCOPY  12/06/2014   rescheduled from 11/01/2014 due to poor prep  . DIAGNOSTIC LAPAROSCOPY    . DISTAL FEMUR OSTEOTOMY Right    Dr. Earnestine Leys  . ESOPHAGOGASTRODUODENOSCOPY Left 01/11/2016   Procedure: ESOPHAGOGASTRODUODENOSCOPY (EGD);  Surgeon: Hulen Luster, MD;  Location: [facility] ENDOSCOPY;  Service: Endoscopy;  Laterality: Left;  . Finger fracture Right    5th finger  . FRACTURE SURGERY Right 01/25/2014   closed reduction & percutaneous pinning of 5th digit  . HAND SURGERY Bilateral    carpal tunnel  . JOINT REPLACEMENT Bilateral    knees  . KNEE SURGERY Bilateral 2003,2007   total Joint  . LAPAROSCOPY FOR ECTOPIC PREGNANCY    .  PERIPHERAL VASCULAR CATHETERIZATION N/A 01/12/2016   Procedure: Visceral Angiography;  Surgeon: Algernon Huxley, MD;  Location: [place] CV LAB;  Service: Cardiovascular;  Laterality: N/A;  . PERIPHERAL VASCULAR CATHETERIZATION N/A 01/12/2016   Procedure: Visceral Artery Intervention;  Surgeon: Algernon Huxley, MD;  Location: [place] CV LAB;  Service: Cardiovascular;  Laterality: N/A;  . REVISION TOTAL KNEE ARTHROPLASTY Right 12/31/2002   Dr. Marry Guan  . TUBAL LIGATION    . VIDEO BRONCHOSCOPY N/A 06/30/2015   Procedure: VIDEO BRONCHOSCOPY WITHOUT FLUORO;  Surgeon:  Vilinda Boehringer, MD;  Location: [facility] ORS;  Service: Cardiopulmonary;  Laterality: N/A;    Prior to Admission medications   Medication Sig Start Date End Date Taking? Authorizing Provider  albuterol (PROVENTIL HFA;VENTOLIN HFA) 108 (90 BASE) MCG/ACT inhaler Inhale 2 puffs into the lungs every 6 (six) hours as needed for wheezing or shortness of breath.     [provider]  amLODipine (NORVASC) 10 MG tablet Take 10 mg by mouth daily.     [provider]  Azelastine HCl 0.15 % SOLN Place 1 spray into both nostrils as needed. 02/23/16   [provider]  clobetasol ointment (TEMOVATE) 9.24 % Apply 1 application topically as needed. 11/15/16   [provider]  DEXILANT 60 MG capsule Take 60 mg by mouth daily.  04/14/17   [provider]  Fluticasone-Salmeterol (ADVAIR DISKUS) 500-50 MCG/DOSE AEPB Inhale 1 puff into the lungs 2 (two) times daily. 05/27/16   Mungal, Vishal, MD  GENVOYA 150-150-200-10 MG TABS tablet Take 1 tablet by mouth daily. 11/09/16   [provider]  hydrochlorothiazide (HYDRODIURIL) 25 MG tablet Take 25 mg by mouth daily.    [provider]  HYDROcodone-acetaminophen (NORCO/VICODIN) 5-325 MG tablet Take 1 tablet by mouth every 4 (four) hours as needed for moderate pain. 12/02/18 12/02/19  Triplett, Johnette Abraham B, FNP  hydrOXYzine (ATARAX/VISTARIL) 25 MG tablet Take 25 mg by mouth 3 (three) times daily as needed.    [provider]  ipratropium-albuterol (DUONEB) 0.5-2.5 (3) MG/3ML SOLN Inhale 3 mLs into the lungs every 6 (six) hours as needed (for wheezing/shortness of breath).     [provider]  losartan (COZAAR) 50 MG tablet Take 50 mg by mouth daily.    [provider]  omeprazole (PRILOSEC) 40 MG capsule Take 40 mg by mouth daily.     [provider]  promethazine (PHENERGAN) 25 MG tablet Take 25 mg by mouth every 6 (six) hours as needed for nausea or vomiting.     [provider]   traZODone (DESYREL) 100 MG tablet  02/14/17   [provider]  valACYclovir (VALTREX) 500 MG tablet Take 500 mg by mouth 2 (two) times daily.    [provider]    Allergies Morphine and related; Gabapentin; and Zanaflex  [tizanidine]  Family History  Problem Relation Age of Onset  . Breast cancer Sister     Social History Social History   Tobacco Use  . Smoking status: Current Some Day Smoker    Packs/day: 0.25    Years: 38.00    Pack years: 9.50    Types: Cigarettes  . Smokeless tobacco: Never Used  . Tobacco comment: 1 cig daily  Substance Use Topics  . Alcohol use: Yes    Alcohol/week: 5.0 standard drinks    Types: 5 Glasses of wine per week    Comment: wine - couple days of week  . Drug use: No    Comment: former  Review of Systems  Constitutional: Negative for fever. Cardiovascular: Negative for chest pain. Respiratory: Negative for shortness of breath. Gastrointestinal: Negative for abdominal pain, vomiting and diarrhea. Genitourinary: Negative for dysuria. Musculoskeletal: Positive for back pain. Skin: Negative for rash. Neurological: Negative for headaches, focal weakness or numbness. ____________________________________________  PHYSICAL EXAM:  VITAL SIGNS: ED Triage Vitals  Enc Vitals Group     BP 12/05/18 1532 127/71     Pulse Rate 12/05/18 1532 88     Resp 12/05/18 1532 (!) 22     Temp 12/05/18 1532 98.4 F (36.9 C)     Temp Source 12/05/18 1532 Oral     SpO2 12/05/18 1532 96 %     Weight 12/05/18 1529 299 lb (135.6 kg)     Height 12/05/18 1529 5\' 6"  (1.676 m)     Head Circumference --      Peak Flow --      Pain Score 12/05/18 1529 10     Pain Loc --      Pain Edu? --      Excl. in [place]? --     Constitutional: Alert and oriented. Well appearing and in no distress. Head: Normocephalic and atraumatic. Eyes: Conjunctivae are normal. Normal extraocular movements Cardiovascular: Normal rate, regular rhythm. Normal distal  pulses. Respiratory: Normal respiratory effort.  Musculoskeletal: Normal spinal alignment without midline tenderness, spasm, deformity, or step-off.  Patient with normal lumbar flexion range on exam.   Nontender with normal range of motion in all extremities.  Neurologic:  Normal gait without ataxia. Negative seated straight leg raise bilaterally. Normal speech and language. No gross focal neurologic deficits are appreciated. Skin:  Skin is warm, dry and intact. No rash noted. ____________________________________________  PROCEDURES  Procedures Toradol 60 mg IM Norflex 60 mg IM ____________________________________________  INITIAL IMPRESSION / ASSESSMENT AND PLAN / ED COURSE  Presents to the ED for evaluation of acute on chronic low back pain.  Patient denies any interim injury.  She is again in a position where she is not able to get her routine epidural steroid injection until the end of the month.  Patient was seen by her primary care provider and another provider in the ED in the last 2 weeks.  She presents now for request of pain medicine in the form of an injection to allow to return to work.  She will fill the previously prescribed steroid as prescribed, and continue with what of her remaining narcotic pain medicine she has at this time.  She will follow with primary provider or her specialist in pain management, as scheduled.  Return precautions have been reviewed.  I reviewed the patient's prescription history over the last 12 months in the multi-state controlled substances database(s) that includes [place], [place], [place], [place], [place], [place], [place], [place], [place], [place], [place], [place], [place], and [place].  Results were notable for recent prescriptions.  ____________________________________________  FINAL CLINICAL IMPRESSION(S) / ED DIAGNOSES  Final diagnoses:  Encounter for pain management  Exacerbation of chronic back  pain      Carmie End, Dannielle Karvonen, PA-C 12/05/18 1941    Harvest Dark, MD 12/05/18 2240

## 2018-12-30 ENCOUNTER — Other Ambulatory Visit: Payer: Self-pay

## 2018-12-30 ENCOUNTER — Emergency Department
Admission: EM | Admit: 2018-12-30 | Discharge: 2018-12-30 | Disposition: A | Discharge status: Home / Self Care | Payer: Medicare Other | Attending: Emergency Medicine | Admitting: Emergency Medicine

## 2018-12-30 ENCOUNTER — Encounter: Payer: Self-pay | Admitting: Emergency Medicine

## 2018-12-30 DIAGNOSIS — J40 Bronchitis, not specified as acute or chronic: Secondary | ICD-10-CM | POA: Insufficient documentation

## 2018-12-30 DIAGNOSIS — Z21 Asymptomatic human immunodeficiency virus [HIV] infection status: Secondary | ICD-10-CM | POA: Diagnosis not present

## 2018-12-30 DIAGNOSIS — Z96659 Presence of unspecified artificial knee joint: Secondary | ICD-10-CM | POA: Diagnosis not present

## 2018-12-30 DIAGNOSIS — Z79899 Other long term (current) drug therapy: Secondary | ICD-10-CM | POA: Diagnosis not present

## 2018-12-30 DIAGNOSIS — I10 Essential (primary) hypertension: Secondary | ICD-10-CM | POA: Insufficient documentation

## 2018-12-30 DIAGNOSIS — F17210 Nicotine dependence, cigarettes, uncomplicated: Secondary | ICD-10-CM | POA: Diagnosis not present

## 2018-12-30 DIAGNOSIS — J029 Acute pharyngitis, unspecified: Secondary | ICD-10-CM | POA: Diagnosis present

## 2018-12-30 DIAGNOSIS — J449 Chronic obstructive pulmonary disease, unspecified: Secondary | ICD-10-CM | POA: Diagnosis not present

## 2018-12-30 MED ORDER — AZITHROMYCIN 250 MG PO TABS: ORAL_TABLET | ORAL | 0 refills | Status: AC

## 2018-12-30 NOTE — Discharge Instructions (Addendum)
Please return for fever or if you get short of breath.  Also return if this rash gets worse.  Follow-up with your regular doctor in the next week or so.  Do your best to quit smoking again.  Take the Z-Pak as directed. If you get a fever, please be sure to quarantine yourself for at least 3 days of being fever free without antipyretics and improvement in the cough or respiratory symptoms and at least 7 days after your fever starts.  I would wear a mask the whole time have the cough.

## 2018-12-30 NOTE — ED Provider Notes (Addendum)
[facility] Emergency Department Provider Note   ____________________________________________   First MD Initiated Contact with Patient 12/30/18 [phone]     (approximate)  I have reviewed the triage vital signs and the nursing notes.   HISTORY  Chief Complaint Sore Throat and Cough    HPI Melanie Bowen is a 58 y.o. female who reports she started back smoking recently.  She has had 2 or 3 days of coughing and sore throat.  She is coughing up small amounts of green phlegm.  She is not running a fever.  She is also had some bruising on her arms and legs small bruises about dime size for no apparent reason.  I offered to get some blood work on her to check on this but she does not want any, she does not want to wait.        Past Medical History:  Diagnosis Date  . Acute GI hemorrhage 12/2015   required transfusion  . Allergic rhinitis   . Arthritis    DDD  . Asthma   . Blepharitis   . Bronchitis   . Cigarette smoker   . Claustrophobia   . COPD (chronic obstructive pulmonary disease) ([place])   . Cough   . DDD (degenerative disc disease), lumbar 04/10/2014  . Depression   . Diverticulosis   . Ectopic pregnancy   . Eye irritation   . Genital herpes   . GERD (gastroesophageal reflux disease)   . Headache   . HIV (human immunodeficiency virus infection) ([place])   . Hypertension   . Paresthesia of hand, bilateral   . Shortness of breath dyspnea    at times due to asthma    Patient Active Problem List   Diagnosis Date Noted  . Violation of controlled substance agreement 12/05/2017  . Positive urine drug screen (+cocaine) 12/05/2017  . Cervicalgia 12/05/2017  . Drug-seeking behavior 12/05/2017  . Community acquired pneumonia 11/28/2016  . Pneumonia 11/28/2016  . Allergic rhinitis 05/27/2016  . Tobacco abuse counseling 02/11/2016  . Symptomatic anemia   . Lower GI bleed   . Anemia 01/16/2016  . GI bleed 01/10/2016  . GERD (gastroesophageal  reflux disease) 01/10/2016  . HTN (hypertension) 01/10/2016  . Depression 01/10/2016  . HIV (human immunodeficiency virus infection) ([place]) 01/10/2016  . Lumbar pseudoarthrosis 10/31/2015  . Chronic cough   . Cough 06/19/2015  . DDD (degenerative disc disease), lumbar 12/31/2014  . Degenerative disc disease, lumbar 12/31/2014  . Asthma, chronic 11/12/2014    Past Surgical History:  Procedure Laterality Date  . BACK SURGERY  12/2014   lumbar lam. Dr. Deri Fuelling  . BACK SURGERY  12/25/2012   Removal lumbosubarachnoid shunt system, L5-S1 TF ESI, Dr. Virl Axe Minchew  . back surgery may 2016     lumbar   . BUNIONECTOMY Bilateral    feet  . COLONOSCOPY  12/06/2014   rescheduled from 11/01/2014 due to poor prep  . DIAGNOSTIC LAPAROSCOPY    . DISTAL FEMUR OSTEOTOMY Right    Dr. Earnestine Leys  . ESOPHAGOGASTRODUODENOSCOPY Left 01/11/2016   Procedure: ESOPHAGOGASTRODUODENOSCOPY (EGD);  Surgeon: Hulen Luster, MD;  Location: [facility] ENDOSCOPY;  Service: Endoscopy;  Laterality: Left;  . Finger fracture Right    5th finger  . FRACTURE SURGERY Right 01/25/2014   closed reduction & percutaneous pinning of 5th digit  . HAND SURGERY Bilateral    carpal tunnel  . JOINT REPLACEMENT Bilateral    knees  . KNEE SURGERY Bilateral [phone]  total Joint  . LAPAROSCOPY FOR ECTOPIC PREGNANCY    . PERIPHERAL VASCULAR CATHETERIZATION N/A 01/12/2016   Procedure: Visceral Angiography;  Surgeon: Algernon Huxley, MD;  Location: [place] CV LAB;  Service: Cardiovascular;  Laterality: N/A;  . PERIPHERAL VASCULAR CATHETERIZATION N/A 01/12/2016   Procedure: Visceral Artery Intervention;  Surgeon: Algernon Huxley, MD;  Location: [place] CV LAB;  Service: Cardiovascular;  Laterality: N/A;  . REVISION TOTAL KNEE ARTHROPLASTY Right 12/31/2002   Dr. Marry Guan  . TUBAL LIGATION    . VIDEO BRONCHOSCOPY N/A 06/30/2015   Procedure: VIDEO BRONCHOSCOPY WITHOUT FLUORO;  Surgeon: Vilinda Boehringer, MD;  Location: [facility] ORS;   Service: Cardiopulmonary;  Laterality: N/A;    Prior to Admission medications   Medication Sig Start Date End Date Taking? Authorizing Provider  albuterol (PROVENTIL HFA;VENTOLIN HFA) 108 (90 BASE) MCG/ACT inhaler Inhale 2 puffs into the lungs every 6 (six) hours as needed for wheezing or shortness of breath.     [provider]  amLODipine (NORVASC) 10 MG tablet Take 10 mg by mouth daily.     [provider]  Azelastine HCl 0.15 % SOLN Place 1 spray into both nostrils as needed. 02/23/16   [provider]  clobetasol ointment (TEMOVATE) 3.47 % Apply 1 application topically as needed. 11/15/16   [provider]  DEXILANT 60 MG capsule Take 60 mg by mouth daily.  04/14/17   [provider]  Fluticasone-Salmeterol (ADVAIR DISKUS) 500-50 MCG/DOSE AEPB Inhale 1 puff into the lungs 2 (two) times daily. 05/27/16   Mungal, Vishal, MD  GENVOYA 150-150-200-10 MG TABS tablet Take 1 tablet by mouth daily. 11/09/16   [provider]  hydrochlorothiazide (HYDRODIURIL) 25 MG tablet Take 25 mg by mouth daily.    [provider]  HYDROcodone-acetaminophen (NORCO/VICODIN) 5-325 MG tablet Take 1 tablet by mouth every 4 (four) hours as needed for moderate pain. 12/02/18 12/02/19  Triplett, Johnette Abraham B, FNP  hydrOXYzine (ATARAX/VISTARIL) 25 MG tablet Take 25 mg by mouth 3 (three) times daily as needed.    [provider]  ipratropium-albuterol (DUONEB) 0.5-2.5 (3) MG/3ML SOLN Inhale 3 mLs into the lungs every 6 (six) hours as needed (for wheezing/shortness of breath).     [provider]  losartan (COZAAR) 50 MG tablet Take 50 mg by mouth daily.    [provider]  omeprazole (PRILOSEC) 40 MG capsule Take 40 mg by mouth daily.     [provider]  promethazine (PHENERGAN) 25 MG tablet Take 25 mg by mouth every 6 (six) hours as needed for nausea or vomiting.     [provider]  traZODone (DESYREL) 100 MG tablet  02/14/17    [provider]  valACYclovir (VALTREX) 500 MG tablet Take 500 mg by mouth 2 (two) times daily.    [provider]    Allergies Morphine and related; Gabapentin; and Zanaflex  [tizanidine]  Family History  Problem Relation Age of Onset  . Breast cancer Sister     Social History Social History   Tobacco Use  . Smoking status: Current Some Day Smoker    Packs/day: 0.25    Years: 38.00    Pack years: 9.50    Types: Cigarettes  . Smokeless tobacco: Never Used  . Tobacco comment: 1 cig daily  Substance Use Topics  . Alcohol use: Yes    Alcohol/week: 5.0 standard drinks    Types: 5 Glasses of wine per week    Comment: wine - couple days of  week  . Drug use: No    Comment: former    Review of Systems  Constitutional: No fever/chills Eyes: No visual changes. ENT: No sore throat. Cardiovascular: Denies chest pain. Respiratory: Denies shortness of breath. Gastrointestinal: No abdominal pain.  No nausea, no vomiting.  No diarrhea.  No constipation. Genitourinary: Negative for dysuria. Musculoskeletal: Negative for back pain. Skin: Negative for rash. Neurological: Negative for headaches, focal weakness    ____________________________________________   PHYSICAL EXAM:  VITAL SIGNS: ED Triage Vitals  Enc Vitals Group     BP 12/30/18 0743 (!) 145/63     Pulse Rate 12/30/18 0743 82     Resp 12/30/18 0743 20     Temp 12/30/18 0743 98.7 F (37.1 C)     Temp Source 12/30/18 0743 Oral     SpO2 12/30/18 0743 97 %     Weight 12/30/18 0747 298 lb (135.2 kg)     Height 12/30/18 0747 5\' 6"  (1.676 m)     Head Circumference --      Peak Flow --      Pain Score 12/30/18 0747 10     Pain Loc --      Pain Edu? --      Excl. in [place]? --     Constitutional: Alert and oriented. Well appearing and in no acute distress. Eyes: Conjunctivae are normal. PERRL. EOMI. Head: Atraumatic. Nose: No congestion/rhinnorhea. Mouth/Throat: Mucous membranes are moist.   Oropharynx non-erythematous. Neck: No stridor.  Cardiovascular: Normal rate, regular rhythm. Grossly normal heart sounds.  Good peripheral circulation. Respiratory: Normal respiratory effort.  No retractions. Lungs CTAB. Gastrointestinal: Soft and nontender. No distention. No abdominal bruits. No CVA tenderness. Neurologic:  Normal speech and language. No gross focal neurologic deficits are appreciated Skin:  Skin is warm, dry and intact. No rash noted.   ____________________________________________   LABS (all labs ordered are listed, but only abnormal results are displayed)  Labs Reviewed - No data to display ____________________________________________  EKG   ____________________________________________  RADIOLOGY  ED MD interpretation:   Official radiology report(s): No results found.  ____________________________________________   PROCEDURES  Procedure(s) performed (including Critical Care):  Procedures   ____________________________________________   INITIAL IMPRESSION / ASSESSMENT AND PLAN / ED COURSE  Patient with slightly productive cough no fever I will get her a Z-Pak.  Her lungs are clear she has no edema either.  She does not want any blood work to check on the rash.  I offered her again.  They are about 15 possibly 20 spots on her arms and legs.  Patient will come back if she runs a fever or gets more short of breath or has any other worsening of her symptoms or new problems.  Patient does not meet our limitations for COVID testing even with the new expanded testing limitations.      Melanie Bowen was evaluated in Emergency Department on 12/30/2018 for the symptoms described in the history of present illness. She was evaluated in the context of the global COVID-19 pandemic, which necessitated consideration that the patient might be at risk for infection with the SARS-CoV-2 virus that causes COVID-19. Institutional protocols and algorithms that pertain to  the evaluation of patients at risk for COVID-19 are in a state of rapid change based on information released by regulatory bodies including the CDC and federal and state organizations. These policies and algorithms were followed during the patient's care in the ED.       ____________________________________________   FINAL  CLINICAL IMPRESSION(S) / ED DIAGNOSES  Final diagnoses:  Bronchitis     ED Discharge Orders    None       Note:  This document was prepared using Dragon voice recognition software and may include unintentional dictation errors.    Nena Polio, MD 12/30/18 0800    Nena Polio, MD 12/30/18 [phone]

## 2018-12-30 NOTE — ED Triage Notes (Signed)
Sore throat and cough x 2 days

## 2019-01-17 ENCOUNTER — Emergency Department: Payer: Medicare Other

## 2019-01-17 ENCOUNTER — Other Ambulatory Visit: Payer: Self-pay

## 2019-01-17 ENCOUNTER — Encounter: Payer: Self-pay | Admitting: Emergency Medicine

## 2019-01-17 ENCOUNTER — Inpatient Hospital Stay
Admission: EM | Admit: 2019-01-17 | Discharge: 2019-01-18 | DRG: 392 | Disposition: A | Discharge status: Home / Self Care | Payer: Medicare Other | Attending: Surgery | Admitting: Surgery

## 2019-01-17 DIAGNOSIS — K5720 Diverticulitis of large intestine with perforation and abscess without bleeding: Secondary | ICD-10-CM | POA: Diagnosis present

## 2019-01-17 DIAGNOSIS — F40240 Claustrophobia: Secondary | ICD-10-CM | POA: Diagnosis present

## 2019-01-17 DIAGNOSIS — K219 Gastro-esophageal reflux disease without esophagitis: Secondary | ICD-10-CM | POA: Diagnosis present

## 2019-01-17 DIAGNOSIS — K5732 Diverticulitis of large intestine without perforation or abscess without bleeding: Principal | ICD-10-CM

## 2019-01-17 DIAGNOSIS — J449 Chronic obstructive pulmonary disease, unspecified: Secondary | ICD-10-CM | POA: Diagnosis present

## 2019-01-17 DIAGNOSIS — Z6841 Body Mass Index (BMI) 40.0 and over, adult: Secondary | ICD-10-CM | POA: Diagnosis not present

## 2019-01-17 DIAGNOSIS — I10 Essential (primary) hypertension: Secondary | ICD-10-CM | POA: Diagnosis present

## 2019-01-17 DIAGNOSIS — Z21 Asymptomatic human immunodeficiency virus [HIV] infection status: Secondary | ICD-10-CM | POA: Diagnosis present

## 2019-01-17 DIAGNOSIS — Z1159 Encounter for screening for other viral diseases: Secondary | ICD-10-CM | POA: Diagnosis not present

## 2019-01-17 DIAGNOSIS — F17210 Nicotine dependence, cigarettes, uncomplicated: Secondary | ICD-10-CM | POA: Diagnosis present

## 2019-01-17 DIAGNOSIS — R0602 Shortness of breath: Secondary | ICD-10-CM

## 2019-01-17 DIAGNOSIS — Z96651 Presence of right artificial knee joint: Secondary | ICD-10-CM | POA: Diagnosis present

## 2019-01-17 LAB — URINALYSIS, COMPLETE (UACMP) WITH MICROSCOPIC
Bacteria, UA: NONE SEEN
Bilirubin Urine: NEGATIVE
Glucose, UA: NEGATIVE mg/dL
Ketones, ur: NEGATIVE mg/dL
Nitrite: NEGATIVE
Protein, ur: NEGATIVE mg/dL
Specific Gravity, Urine: 1.017 (ref 1.005–1.030)
pH: 5 (ref 5.0–8.0)

## 2019-01-17 LAB — COMPREHENSIVE METABOLIC PANEL
ALT: 20 U/L (ref 0–44)
AST: 17 U/L (ref 15–41)
Albumin: 3 g/dL — ABNORMAL LOW (ref 3.5–5.0)
Alkaline Phosphatase: 104 U/L (ref 38–126)
Anion gap: 9 (ref 5–15)
BUN: 16 mg/dL (ref 6–20)
CO2: 25 mmol/L (ref 22–32)
Calcium: 8.5 mg/dL — ABNORMAL LOW (ref 8.9–10.3)
Chloride: 104 mmol/L (ref 98–111)
Creatinine, Ser: 0.98 mg/dL (ref 0.44–1.00)
GFR calc Af Amer: >60 mL/min (ref >60)
GFR calc non Af Amer: >60 mL/min (ref >60)
Glucose, Bld: 112 mg/dL — ABNORMAL HIGH (ref 70–99)
Potassium: 3.3 mmol/L — ABNORMAL LOW (ref 3.5–5.1)
Sodium: 138 mmol/L (ref 135–145)
Total Bilirubin: 0.6 mg/dL (ref 0.3–1.2)
Total Protein: 6.7 g/dL (ref 6.5–8.1)

## 2019-01-17 LAB — CBC
HCT: 36.4 % (ref 36.0–46.0)
Hemoglobin: 11.8 g/dL — ABNORMAL LOW (ref 12.0–15.0)
MCH: 28.8 pg (ref 26.0–34.0)
MCHC: 32.4 g/dL (ref 30.0–36.0)
MCV: 88.8 fL (ref 80.0–100.0)
Platelets: 386 10*3/uL (ref 150–400)
RBC: 4.1 MIL/uL (ref 3.87–5.11)
RDW: 19.9 % — ABNORMAL HIGH (ref 11.5–15.5)
WBC: 8.1 10*3/uL (ref 4.0–10.5)
nRBC: 0 % (ref 0.0–0.2)

## 2019-01-17 LAB — SARS CORONAVIRUS 2 BY RT PCR (HOSPITAL ORDER, PERFORMED IN ~~LOC~~ HOSPITAL LAB): SARS Coronavirus 2: NEGATIVE

## 2019-01-17 LAB — LIPASE, BLOOD: Lipase: 33 U/L (ref 11–51)

## 2019-01-17 MED ORDER — HYDROMORPHONE HCL 1 MG/ML IJ SOLN
0.5000 mg | INTRAMUSCULAR | Status: DC | PRN
  Administered 2019-01-17 – 2019-01-18 (×2): 0.5 mg via INTRAVENOUS
  Filled 2019-01-17 (×2): qty 0.5

## 2019-01-17 MED ORDER — ONDANSETRON 4 MG PO TBDP
4.0000 mg | ORAL_TABLET | Freq: Four times a day (QID) | ORAL | Status: DC | PRN
  Administered 2019-01-18: 4 mg via ORAL
  Filled 2019-01-17: qty 1

## 2019-01-17 MED ORDER — AZELASTINE HCL 0.1 % NA SOLN
1.0000 | NASAL | Status: DC | PRN
  Filled 2019-01-17: qty 30

## 2019-01-17 MED ORDER — METRONIDAZOLE IN NACL 5-0.79 MG/ML-% IV SOLN
500.0000 mg | Freq: Three times a day (TID) | INTRAVENOUS | Status: DC
  Administered 2019-01-17 – 2019-01-18 (×3): 500 mg via INTRAVENOUS
  Filled 2019-01-17 (×6): qty 100

## 2019-01-17 MED ORDER — IPRATROPIUM-ALBUTEROL 0.5-2.5 (3) MG/3ML IN SOLN: 3.0000 mL | Freq: Four times a day (QID) | RESPIRATORY_TRACT | Status: DC | PRN

## 2019-01-17 MED ORDER — HYDROXYZINE HCL 25 MG PO TABS: 25.0000 mg | ORAL_TABLET | Freq: Three times a day (TID) | ORAL | Status: DC | PRN

## 2019-01-17 MED ORDER — TRAMADOL HCL 50 MG PO TABS
50.0000 mg | ORAL_TABLET | Freq: Four times a day (QID) | ORAL | Status: DC | PRN
  Administered 2019-01-18: 50 mg via ORAL
  Filled 2019-01-17: qty 1

## 2019-01-17 MED ORDER — HYDROMORPHONE HCL 1 MG/ML IJ SOLN
1.0000 mg | Freq: Once | INTRAMUSCULAR | Status: AC
  Administered 2019-01-17: 1 mg via INTRAVENOUS
  Filled 2019-01-17: qty 1

## 2019-01-17 MED ORDER — DOCUSATE SODIUM 100 MG PO CAPS
100.0000 mg | ORAL_CAPSULE | Freq: Two times a day (BID) | ORAL | Status: DC | PRN
  Administered 2019-01-18: 100 mg via ORAL
  Filled 2019-01-17: qty 1

## 2019-01-17 MED ORDER — SODIUM CHLORIDE 0.9 % IV SOLN
2.0000 g | INTRAVENOUS | Status: DC
  Administered 2019-01-17: 2 g via INTRAVENOUS
  Filled 2019-01-17: qty 2
  Filled 2019-01-17: qty 20

## 2019-01-17 MED ORDER — TRAZODONE HCL 100 MG PO TABS: 100.0000 mg | ORAL_TABLET | Freq: Every evening | ORAL | Status: DC | PRN

## 2019-01-17 MED ORDER — ENOXAPARIN SODIUM 40 MG/0.4ML ~~LOC~~ SOLN
40.0000 mg | Freq: Two times a day (BID) | SUBCUTANEOUS | Status: DC
  Administered 2019-01-18: 40 mg via SUBCUTANEOUS
  Filled 2019-01-17: qty 0.4

## 2019-01-17 MED ORDER — VALACYCLOVIR HCL 500 MG PO TABS
500.0000 mg | ORAL_TABLET | Freq: Two times a day (BID) | ORAL | Status: DC
  Administered 2019-01-17: 500 mg via ORAL
  Filled 2019-01-17 (×2): qty 1

## 2019-01-17 MED ORDER — ACETAMINOPHEN 500 MG PO TABS
1000.0000 mg | ORAL_TABLET | Freq: Four times a day (QID) | ORAL | Status: DC
  Administered 2019-01-17 – 2019-01-18 (×3): 1000 mg via ORAL
  Filled 2019-01-17 (×3): qty 2

## 2019-01-17 MED ORDER — ELVITEG-COBIC-EMTRICIT-TENOFAF 150-150-200-10 MG PO TABS
1.0000 | ORAL_TABLET | Freq: Every day | ORAL | Status: DC
  Administered 2019-01-17: 1 via ORAL
  Filled 2019-01-17 (×2): qty 1

## 2019-01-17 MED ORDER — LOSARTAN POTASSIUM 50 MG PO TABS
50.0000 mg | ORAL_TABLET | Freq: Every day | ORAL | Status: DC
  Administered 2019-01-18: 50 mg via ORAL
  Filled 2019-01-17 (×2): qty 1

## 2019-01-17 MED ORDER — FENTANYL CITRATE (PF) 100 MCG/2ML IJ SOLN
50.0000 ug | Freq: Once | INTRAMUSCULAR | Status: AC
  Administered 2019-01-17: 50 ug via INTRAVENOUS
  Filled 2019-01-17: qty 2

## 2019-01-17 MED ORDER — IOHEXOL 300 MG/ML  SOLN
125.0000 mL | Freq: Once | INTRAMUSCULAR | Status: AC | PRN
  Administered 2019-01-17: 125 mL via INTRAVENOUS
  Filled 2019-01-17: qty 125

## 2019-01-17 MED ORDER — CLOBETASOL PROPIONATE 0.05 % EX OINT
1.0000 "application " | TOPICAL_OINTMENT | CUTANEOUS | Status: DC | PRN
  Filled 2019-01-17: qty 15

## 2019-01-17 MED ORDER — HYDROCHLOROTHIAZIDE 25 MG PO TABS
25.0000 mg | ORAL_TABLET | Freq: Every day | ORAL | Status: DC
  Administered 2019-01-18: 25 mg via ORAL
  Filled 2019-01-17 (×2): qty 1

## 2019-01-17 MED ORDER — LACTATED RINGERS IV SOLN
INTRAVENOUS | Status: DC
  Administered 2019-01-17 – 2019-01-18 (×2): via INTRAVENOUS

## 2019-01-17 MED ORDER — FLUTICASONE FUROATE-VILANTEROL 200-25 MCG/INH IN AEPB
1.0000 | INHALATION_SPRAY | Freq: Every day | RESPIRATORY_TRACT | Status: DC
  Administered 2019-01-17 – 2019-01-18 (×2): 1 via RESPIRATORY_TRACT
  Filled 2019-01-17 (×2): qty 28

## 2019-01-17 MED ORDER — PIPERACILLIN-TAZOBACTAM 3.375 G IVPB 30 MIN
3.3750 g | Freq: Once | INTRAVENOUS | Status: AC
  Administered 2019-01-17: 3.375 g via INTRAVENOUS
  Filled 2019-01-17: qty 50

## 2019-01-17 MED ORDER — AMLODIPINE BESYLATE 10 MG PO TABS
10.0000 mg | ORAL_TABLET | Freq: Every day | ORAL | Status: DC
  Administered 2019-01-18: 10 mg via ORAL
  Filled 2019-01-17 (×2): qty 1

## 2019-01-17 MED ORDER — ONDANSETRON HCL 4 MG/2ML IJ SOLN: 4.0000 mg | Freq: Four times a day (QID) | INTRAMUSCULAR | Status: DC | PRN

## 2019-01-17 MED ORDER — ALBUTEROL SULFATE (2.5 MG/3ML) 0.083% IN NEBU: 2.5000 mg | INHALATION_SOLUTION | Freq: Four times a day (QID) | RESPIRATORY_TRACT | Status: DC | PRN

## 2019-01-17 MED ORDER — PANTOPRAZOLE SODIUM 40 MG PO TBEC
80.0000 mg | DELAYED_RELEASE_TABLET | Freq: Every day | ORAL | Status: DC
  Administered 2019-01-18: 80 mg via ORAL
  Filled 2019-01-17 (×2): qty 2

## 2019-01-17 NOTE — Progress Notes (Signed)
Anticoagulation monitoring(Lovenox):  58 yo female ordered Lovenox 40 mg Q24h  Filed Weights   01/17/19 1225  Weight: 290 lb (131.5 kg)   BMI 46.6    Lab Results  Component Value Date   CREATININE 0.98 01/17/2019   CREATININE 0.95 03/23/2018   CREATININE 1.12 (H) 03/19/2018   Estimated Creatinine Clearance: 89.3 mL/min (by C-G formula based on SCr of 0.98 mg/dL). Hemoglobin & Hematocrit     Component Value Date/Time   HGB 11.8 (L) 01/17/2019 1308   HGB 9.4 (L) 11/29/2016 0713   HCT 36.4 01/17/2019 1308   HCT 29.8 (L) 11/29/2016 8592     Per Protocol for Patient with estCrcl > 30 ml/min and BMI > 40, will transition to Lovenox 40 mg Q12h.

## 2019-01-17 NOTE — Progress Notes (Signed)
The patient refused to have a bed alarm placed.

## 2019-01-17 NOTE — ED Notes (Signed)
ED TO INPATIENT HANDOFF REPORT  ED Nurse Name and Phone #: [name] [phone]  S Name/Age/Gender Glennis Brink 58 y.o. female Room/Bed: ED34A/ED34A  Code Status   Code Status: Prior  Home/SNF/Other Home Patient oriented to: self, place, time and situation Is this baseline? Yes   Triage Complete: Triage complete  Chief Complaint abd pain cough  Triage Note Sore throat, cough and abdominal pain for 4 days now. Has not been around anyone sick, states negative Covid test end of April. NAD.   Allergies Allergies  Allergen Reactions  . Morphine And Related Itching  . Gabapentin Rash  . Zanaflex  [Tizanidine] Rash    Level of Care/Admitting Diagnosis ED Disposition    ED Disposition Condition [facility]: [place] [[id number]]  Level of Care: Med-Surg [16]  Covid Evaluation: N/A  Diagnosis: Diverticulitis large intestine [283151]  Admitting Physician: Benjamine Sprague [[id number]]  Attending Physician: Benjamine Sprague [[id number]]  Estimated length of stay: 3 - 4 days  Certification:: I certify this patient will need inpatient services for at least 2 midnights  PT Class (Do Not Modify): Inpatient [101]  PT Acc Code (Do Not Modify): Private [1]       B Medical/Surgery History Past Medical History:  Diagnosis Date  . Acute GI hemorrhage 12/2015   required transfusion  . Allergic rhinitis   . Arthritis    DDD  . Asthma   . Blepharitis   . Bronchitis   . Cigarette smoker   . Claustrophobia   . COPD (chronic obstructive pulmonary disease) ([place])   . Cough   . DDD (degenerative disc disease), lumbar 04/10/2014  . Depression   . Diverticulosis   . Ectopic pregnancy   . Eye irritation   . Genital herpes   . GERD (gastroesophageal reflux disease)   . Headache   . HIV (human immunodeficiency virus infection) ([place])   . Hypertension   . Paresthesia of hand, bilateral   . Shortness of breath dyspnea    at times due to asthma   Past  Surgical History:  Procedure Laterality Date  . BACK SURGERY  12/2014   lumbar lam. Dr. Deri Fuelling  . BACK SURGERY  12/25/2012   Removal lumbosubarachnoid shunt system, L5-S1 TF ESI, Dr. Virl Axe Minchew  . back surgery may 2016     lumbar   . BUNIONECTOMY Bilateral    feet  . COLONOSCOPY  12/06/2014   rescheduled from 11/01/2014 due to poor prep  . DIAGNOSTIC LAPAROSCOPY    . DISTAL FEMUR OSTEOTOMY Right    Dr. Earnestine Leys  . ESOPHAGOGASTRODUODENOSCOPY Left 01/11/2016   Procedure: ESOPHAGOGASTRODUODENOSCOPY (EGD);  Surgeon: Hulen Luster, MD;  Location: [facility] ENDOSCOPY;  Service: Endoscopy;  Laterality: Left;  . Finger fracture Right    5th finger  . FRACTURE SURGERY Right 01/25/2014   closed reduction & percutaneous pinning of 5th digit  . HAND SURGERY Bilateral    carpal tunnel  . JOINT REPLACEMENT Bilateral    knees  . KNEE SURGERY Bilateral 2003,2007   total Joint  . LAPAROSCOPY FOR ECTOPIC PREGNANCY    . PERIPHERAL VASCULAR CATHETERIZATION N/A 01/12/2016   Procedure: Visceral Angiography;  Surgeon: Algernon Huxley, MD;  Location: [place] CV LAB;  Service: Cardiovascular;  Laterality: N/A;  . PERIPHERAL VASCULAR CATHETERIZATION N/A 01/12/2016   Procedure: Visceral Artery Intervention;  Surgeon: Algernon Huxley, MD;  Location: [place] CV LAB;  Service: Cardiovascular;  Laterality: N/A;  . REVISION  TOTAL KNEE ARTHROPLASTY Right 12/31/2002   Dr. Marry Guan  . TUBAL LIGATION    . VIDEO BRONCHOSCOPY N/A 06/30/2015   Procedure: VIDEO BRONCHOSCOPY WITHOUT FLUORO;  Surgeon: Vilinda Boehringer, MD;  Location: [facility] ORS;  Service: Cardiopulmonary;  Laterality: N/A;     A IV Location/Drains/Wounds Patient Lines/Drains/Airways Status   Active Line/Drains/Airways    Name:   Placement date:   Placement time:   Site:   Days:   Peripheral IV 01/17/19 Left Forearm   01/17/19    1435    Forearm   less than 1   Incision (Closed) 10/31/15 Back Other (Comment)   10/31/15    0928     1174   Wound /  Incision (Open or Dehisced) 01/12/16 Puncture Groin Right   01/12/16    1900    Groin   1101          Intake/Output Last 24 hours No intake or output data in the 24 hours ending 01/17/19 1726  Labs/Imaging Results for orders placed or performed during the hospital encounter of 01/17/19 (from the past 48 hour(s))  Comprehensive metabolic panel     Status: Abnormal   Collection Time: 01/17/19  1:08 PM  Result Value Ref Range   Sodium 138 135 - 145 mmol/L   Potassium 3.3 (L) 3.5 - 5.1 mmol/L   Chloride 104 98 - 111 mmol/L   CO2 25 22 - 32 mmol/L   Glucose, Bld 112 (H) 70 - 99 mg/dL   BUN 16 6 - 20 mg/dL   Creatinine, Ser 0.98 0.44 - 1.00 mg/dL   Calcium 8.5 (L) 8.9 - 10.3 mg/dL   Total Protein 6.7 6.5 - 8.1 g/dL   Albumin 3.0 (L) 3.5 - 5.0 g/dL   AST 17 15 - 41 U/L   ALT 20 0 - 44 U/L   Alkaline Phosphatase 104 38 - 126 U/L   Total Bilirubin 0.6 0.3 - 1.2 mg/dL   GFR calc non Af Amer >60 >60 mL/min   GFR calc Af Amer >60 >60 mL/min   Anion gap 9 5 - 15    Comment: Performed at [facility], [address] [place], [place] [address]  CBC     Status: Abnormal   Collection Time: 01/17/19  1:08 PM  Result Value Ref Range   WBC 8.1 4.0 - 10.5 K/uL   RBC 4.10 3.87 - 5.11 MIL/uL   Hemoglobin 11.8 (L) 12.0 - 15.0 g/dL   HCT 36.4 36.0 - 46.0 %   MCV 88.8 80.0 - 100.0 fL   MCH 28.8 26.0 - 34.0 pg   MCHC 32.4 30.0 - 36.0 g/dL   RDW 19.9 (H) 11.5 - 15.5 %   Platelets 386 150 - 400 K/uL   nRBC 0.0 0.0 - 0.2 %    Comment: Performed at [facility], [address] [place], [place] [address]  Urinalysis, Complete w Microscopic     Status: Abnormal   Collection Time: 01/17/19  1:08 PM  Result Value Ref Range   Color, Urine YELLOW (A) YELLOW   APPearance CLOUDY (A) CLEAR   Specific Gravity, Urine 1.017 1.005 - 1.030   pH 5.0 5.0 - 8.0   Glucose, UA NEGATIVE NEGATIVE mg/dL   Hgb urine dipstick SMALL (A) NEGATIVE   Bilirubin Urine NEGATIVE NEGATIVE   Ketones, ur  NEGATIVE NEGATIVE mg/dL   Protein, ur NEGATIVE NEGATIVE mg/dL   Nitrite NEGATIVE NEGATIVE   Leukocytes,Ua TRACE (A) NEGATIVE   RBC / HPF  0-5 0 - 5 RBC/hpf   WBC, UA 0-5 0 - 5 WBC/hpf   Bacteria, UA NONE SEEN NONE SEEN   Squamous Epithelial / LPF 11-20 0 - 5   Mucus PRESENT    Hyaline Casts, UA PRESENT     Comment: Performed at [facility], [address] [place], [place] [address]  Lipase, blood     Status: None   Collection Time: 01/17/19  1:08 PM  Result Value Ref Range   Lipase 33 11 - 51 U/L    Comment: RESULT CONFIRMED BY MANUAL DILUTION KLM Performed at [facility], [address], [place], [place] [address]   SARS Coronavirus 2 (CEPHEID - Performed in [facility] hospital lab), Hosp Order     Status: None   Collection Time: 01/17/19  3:57 PM  Result Value Ref Range   SARS Coronavirus 2 NEGATIVE NEGATIVE    Comment: (NOTE) If result is NEGATIVE SARS-CoV-2 target nucleic acids are NOT DETECTED. The SARS-CoV-2 RNA is generally detectable in upper and lower  respiratory specimens during the acute phase of infection. The lowest  concentration of SARS-CoV-2 viral copies this assay can detect is 250  copies / mL. A negative result does not preclude SARS-CoV-2 infection  and should not be used as the sole basis for treatment or other  patient management decisions.  A negative result may occur with  improper specimen collection / handling, submission of specimen other  than nasopharyngeal swab, presence of viral mutation(s) within the  areas targeted by this assay, and inadequate number of viral copies  (<250 copies / mL). A negative result must be combined with clinical  observations, patient history, and epidemiological information. If result is POSITIVE SARS-CoV-2 target nucleic acids are DETECTED. The SARS-CoV-2 RNA is generally detectable in upper and lower  respiratory specimens dur ing the acute phase of infection.  Positive  results are indicative  of active infection with SARS-CoV-2.  Clinical  correlation with patient history and other diagnostic information is  necessary to determine patient infection status.  Positive results do  not rule out bacterial infection or co-infection with other viruses. If result is PRESUMPTIVE POSTIVE SARS-CoV-2 nucleic acids MAY BE PRESENT.   A presumptive positive result was obtained on the submitted specimen  and confirmed on repeat testing.  While 2019 novel coronavirus  (SARS-CoV-2) nucleic acids may be present in the submitted sample  additional confirmatory testing may be necessary for epidemiological  and / or clinical management purposes  to differentiate between  SARS-CoV-2 and other Sarbecovirus currently known to infect humans.  If clinically indicated additional testing with an alternate test  methodology [phone]) is advised. The SARS-CoV-2 RNA is generally  detectable in upper and lower respiratory sp ecimens during the acute  phase of infection. The expected result is Negative. Fact Sheet for Patients:  StrictlyIdeas.no Fact Sheet for Healthcare Providers: BankingDealers.co.za This test is not yet approved or cleared by the [place] FDA and has been authorized for detection and/or diagnosis of SARS-CoV-2 by FDA under an Emergency Use Authorization (EUA).  This EUA will remain in effect (meaning this test can be used) for the duration of the COVID-19 declaration under Section 564(b)(1) of the Act, 21 U.S.C. section 360bbb-3(b)(1), unless the authorization is terminated or revoked sooner. Performed at [facility], [address] [place], [place] [address]    Ct Abdomen Pelvis W Contrast  Result Date: 01/17/2019 CLINICAL DATA:  Abdominal pain. EXAM: CT ABDOMEN AND PELVIS WITH CONTRAST  TECHNIQUE: Multidetector CT imaging of the abdomen and pelvis was performed using the standard protocol following bolus administration of  intravenous contrast. CONTRAST:  111mL OMNIPAQUE IOHEXOL 300 MG/ML  SOLN COMPARISON:  None. FINDINGS: Lower chest: No acute abnormality. Hepatobiliary: There is a patent steatosis. The gallbladder is unremarkable. There is no biliary ductal dilatation. Pancreas: Unremarkable. No pancreatic ductal dilatation or surrounding inflammatory changes. Spleen: Normal in size without focal abnormality. Adrenals/Urinary Tract: Adrenal glands are unremarkable. Kidneys are normal, without renal calculi, focal lesion, or hydronephrosis. Bladder is unremarkable. Stomach/Bowel: There is sigmoid diverticulosis with CT evidence of diverticulitis. There are several small phlegmonous collections along the course of the sigmoid colon and distal descending colon. The largest measures 4.2 by 1.8 cm. The additional collections measure approximately 2.4 and 1.7 cm each. The appendix is located in the right lower quadrant and is unremarkable. The transverse colon and ascending colon are unremarkable bladder decompressed which limits evaluation. There is no evidence of a small-bowel obstruction. Vascular/Lymphatic: Aortic atherosclerosis. No enlarged abdominal or pelvic lymph nodes. Reproductive: Uterus and bilateral adnexa are unremarkable. Other: No abdominal wall hernia or abnormality. No abdominopelvic ascites. Musculoskeletal: The patient is status post L3 through L5 posterior fusion with L3-L4 and L4-L5 interbody spacers. The hardware is intact. There is no displaced fracture. IMPRESSION: 1. Acute diverticulitis involving the sigmoid colon and distal descending colon. There are multiple phlegmonous extraluminal collections consistent with developing abscesses. The largest is adjacent to the sigmoid colon and measures 4.2 x 1.8 cm. 2. Hepatomegaly with hepatic steatosis. 3.  Aortic Atherosclerosis (ICD10-I70.0). Electronically Signed   By: Constance Holster M.D.   On: 01/17/2019 14:54   Dg Chest Port 1 View  Result Date:  01/17/2019 CLINICAL DATA:  Sore throat cough and abdomen pain for 4 days. EXAM: PORTABLE CHEST 1 VIEW COMPARISON:  March 23, 2018 FINDINGS: The heart size and mediastinal contours are within normal limits. Both lungs are clear. The visualized skeletal structures are stable. IMPRESSION: No active cardiopulmonary disease. Electronically Signed   By: Abelardo Diesel M.D.   On: 01/17/2019 12:58    Pending Labs [employer] (From admission, onward)    Start     Ordered   Signed and Corporate treasurer  Daily,   R     Signed and Held   Signed and Held  Magnesium  Daily,   R     Signed and Held   Signed and Held  Phosphorus  Daily,   R     Signed and Held   Signed and Held  CBC  Daily,   R     Signed and Held          Vitals/Pain Today's Vitals   01/17/19 1321 01/17/19 1531 01/17/19 1544 01/17/19 1703  BP:   (!) 149/85   Pulse:   89   Resp:   16   Temp:   98.4 F (36.9 C)   TempSrc:   Oral   SpO2:   98%   Weight:      Height:      PainSc: 10-Worst pain ever 10-Worst pain ever  5     Isolation Precautions No active isolations  Medications Medications  fentaNYL (SUBLIMAZE) injection 50 mcg (50 mcg Intravenous Given 01/17/19 1315)  iohexol (OMNIPAQUE) 300 MG/ML solution 125 mL (125 mLs Intravenous Contrast Given 01/17/19 1438)  piperacillin-tazobactam (ZOSYN) IVPB 3.375 g (0 g Intravenous Stopped 01/17/19 1544)  HYDROmorphone (DILAUDID) injection 1 mg (1 mg Intravenous Given 01/17/19 1542)  Mobility walks Low fall risk   Focused Assessments GI assessment- pt has abdominal pain, tender to touch   R Recommendations: See Admitting Provider Note  Report given to:   Additional Notes:

## 2019-01-17 NOTE — H&P (Signed)
Subjective:   CC: Diverticulitis large intestine no abscess  HPI:  Melanie Bowen is a 58 y.o. female who was consulted by Jimmye Norman for issue above.  Symptoms were first noted a few days ago. Pain is mostly in left lower quadrant nonradiating, sharp, intermittent. Associated with small, loose stools, exacerbated by nothing specific.   Past Medical History:  has a past medical history of Acute GI hemorrhage (12/2015), Allergic rhinitis, Arthritis, Asthma, Blepharitis, Bronchitis, Cigarette smoker, Claustrophobia, COPD (chronic obstructive pulmonary disease) ([place]), Cough, DDD (degenerative disc disease), lumbar (04/10/2014), Depression, Diverticulosis, Ectopic pregnancy, Eye irritation, Genital herpes, GERD (gastroesophageal reflux disease), Headache, HIV (human immunodeficiency virus infection) ([place]), Hypertension, Paresthesia of hand, bilateral, and Shortness of breath dyspnea.  Past Surgical History:  Past Surgical History:  Procedure Laterality Date  . BACK SURGERY  12/2014   lumbar lam. Dr. Deri Fuelling  . BACK SURGERY  12/25/2012   Removal lumbosubarachnoid shunt system, L5-S1 TF ESI, Dr. Virl Axe Minchew  . back surgery may 2016     lumbar   . BUNIONECTOMY Bilateral    feet  . COLONOSCOPY  12/06/2014   rescheduled from 11/01/2014 due to poor prep  . DIAGNOSTIC LAPAROSCOPY    . DISTAL FEMUR OSTEOTOMY Right    Dr. Earnestine Leys  . ESOPHAGOGASTRODUODENOSCOPY Left 01/11/2016   Procedure: ESOPHAGOGASTRODUODENOSCOPY (EGD);  Surgeon: Hulen Luster, MD;  Location: [facility] ENDOSCOPY;  Service: Endoscopy;  Laterality: Left;  . Finger fracture Right    5th finger  . FRACTURE SURGERY Right 01/25/2014   closed reduction & percutaneous pinning of 5th digit  . HAND SURGERY Bilateral    carpal tunnel  . JOINT REPLACEMENT Bilateral    knees  . KNEE SURGERY Bilateral 2003,2007   total Joint  . LAPAROSCOPY FOR ECTOPIC PREGNANCY    . PERIPHERAL VASCULAR CATHETERIZATION N/A 01/12/2016   Procedure:  Visceral Angiography;  Surgeon: Algernon Huxley, MD;  Location: [place] CV LAB;  Service: Cardiovascular;  Laterality: N/A;  . PERIPHERAL VASCULAR CATHETERIZATION N/A 01/12/2016   Procedure: Visceral Artery Intervention;  Surgeon: Algernon Huxley, MD;  Location: [place] CV LAB;  Service: Cardiovascular;  Laterality: N/A;  . REVISION TOTAL KNEE ARTHROPLASTY Right 12/31/2002   Dr. Marry Guan  . TUBAL LIGATION    . VIDEO BRONCHOSCOPY N/A 06/30/2015   Procedure: VIDEO BRONCHOSCOPY WITHOUT FLUORO;  Surgeon: Vilinda Boehringer, MD;  Location: [facility] ORS;  Service: Cardiopulmonary;  Laterality: N/A;    Family History: family history includes Breast cancer in her sister.  Social History:  reports that she has been smoking cigarettes. She has a 9.50 pack-year smoking history. She has never used smokeless tobacco. She reports current alcohol use of about 5.0 standard drinks of alcohol per week. She reports that she does not use drugs.  Current Medications: (Not in a hospital admission)   Allergies:  Allergies as of 01/17/2019 - Review Complete 01/17/2019  Allergen Reaction Noted  . Morphine and related Itching 07/28/2015  . Gabapentin Rash 11/12/2014  . Zanaflex  [tizanidine] Rash 11/12/2014    ROS:  General: Denies weight loss, weight gain, fatigue, fevers, chills, and night sweats. Eyes: Denies blurry vision, double vision, eye pain, itchy eyes, and tearing. Ears: Denies hearing loss, earache, and ringing in ears. Nose: Denies sinus pain, congestion, infections, runny nose, and nosebleeds. Mouth/throat: Denies hoarseness, sore throat, bleeding gums, and difficulty swallowing. Heart: Denies chest pain, palpitations, racing heart, irregular heartbeat, leg pain or swelling, and decreased activity tolerance. Respiratory: Denies breathing difficulty, shortness of breath,  wheezing, cough, and sputum. GI: Denies change in appetite, heartburn, nausea, vomiting, constipation. GU: Denies difficulty  urinating, pain with urinating, urgency, frequency, blood in urine. Musculoskeletal: Denies joint stiffness, pain, swelling, muscle weakness. Skin: Denies rash, itching, mass, tumors, sores, and boils Neurologic: Denies headache, fainting, dizziness, seizures, numbness, and tingling. Psychiatric: Denies depression, anxiety, difficulty sleeping, and memory loss. Endocrine: Denies heat or cold intolerance, and increased thirst or urination. Blood/lymph: Denies easy bruising, easy bruising, and swollen glands     Objective:     BP (!) 149/85   Pulse 89   Temp 98.4 F (36.9 C) (Oral)   Resp 16   Ht 5\' 6"  (1.676 m)   Wt 131.5 kg   SpO2 98%   BMI 46.81 kg/m   Constitutional :  alert, cooperative, appears stated age and mild distress  Lymphatics/Throat:  no asymmetry, masses, or scars  Respiratory:  clear to auscultation bilaterally  Cardiovascular:  regular rate and rhythm  Gastrointestinal: Soft, no guarding, but focal tenderness noted in the left lower quadrant.   Musculoskeletal: Steady gait and movement  Skin: Cool and moist   Psychiatric: Normal affect, non-agitated, not confused       LABS:  CMP Latest Ref Rng & Units 01/17/2019 03/23/2018 03/19/2018  Glucose 70 - 99 mg/dL 112(H) 113(H) 105(H)  BUN 6 - 20 mg/dL 16 17 12   Creatinine 0.44 - 1.00 mg/dL 0.98 0.95 1.12(H)  Sodium 135 - 145 mmol/L 138 138 137  Potassium 3.5 - 5.1 mmol/L 3.3(L) 3.3(L) 3.9  Chloride 98 - 111 mmol/L 104 106 105  CO2 22 - 32 mmol/L 25 22 23   Calcium 8.9 - 10.3 mg/dL 8.5(L) 8.6(L) 9.0  Total Protein 6.5 - 8.1 g/dL 6.7 - -  Total Bilirubin 0.3 - 1.2 mg/dL 0.6 - -  Alkaline Phos 38 - 126 U/L 104 - -  AST 15 - 41 U/L 17 - -  ALT 0 - 44 U/L 20 - -   CBC Latest Ref Rng & Units 01/17/2019 03/23/2018 03/19/2018  WBC 4.0 - 10.5 K/uL 8.1 8.8 7.7  Hemoglobin 12.0 - 15.0 g/dL 11.8(L) 12.0 11.7(L)  Hematocrit 36.0 - 46.0 % 36.4 37.0 35.0  Platelets 150 - 400 K/uL 386 424 397    RADS: CLINICAL DATA:   Abdominal pain.  EXAM: CT ABDOMEN AND PELVIS WITH CONTRAST  TECHNIQUE: Multidetector CT imaging of the abdomen and pelvis was performed using the standard protocol following bolus administration of intravenous contrast.  CONTRAST:  174mL OMNIPAQUE IOHEXOL 300 MG/ML  SOLN  COMPARISON:  None.  FINDINGS: Lower chest: No acute abnormality.  Hepatobiliary: There is a patent steatosis. The gallbladder is unremarkable. There is no biliary ductal dilatation.  Pancreas: Unremarkable. No pancreatic ductal dilatation or surrounding inflammatory changes.  Spleen: Normal in size without focal abnormality.  Adrenals/Urinary Tract: Adrenal glands are unremarkable. Kidneys are normal, without renal calculi, focal lesion, or hydronephrosis. Bladder is unremarkable.  Stomach/Bowel: There is sigmoid diverticulosis with CT evidence of diverticulitis. There are several small phlegmonous collections along the course of the sigmoid colon and distal descending colon. The largest measures 4.2 by 1.8 cm. The additional collections measure approximately 2.4 and 1.7 cm each. The appendix is located in the right lower quadrant and is unremarkable. The transverse colon and ascending colon are unremarkable bladder decompressed which limits evaluation. There is no evidence of a small-bowel obstruction.  Vascular/Lymphatic: Aortic atherosclerosis. No enlarged abdominal or pelvic lymph nodes.  Reproductive: Uterus and bilateral adnexa are unremarkable.  Other: No  abdominal wall hernia or abnormality. No abdominopelvic ascites.  Musculoskeletal: The patient is status post L3 through L5 posterior fusion with L3-L4 and L4-L5 interbody spacers. The hardware is intact. There is no displaced fracture.  IMPRESSION: 1. Acute diverticulitis involving the sigmoid colon and distal descending colon. There are multiple phlegmonous extraluminal collections consistent with developing abscesses.  The largest is adjacent to the sigmoid colon and measures 4.2 x 1.8 cm. 2. Hepatomegaly with hepatic steatosis. 3.  Aortic Atherosclerosis (ICD10-I70.0).   Electronically Signed   By: Constance Holster M.D.   On: 01/17/2019 14:54 Assessment:   Diverticulitis large intestine no abscess History of HIV currently on meds Hypertension  Plan:    White count normal, but pain still present, and CT concerning for developing abscess.  Despite patient's initial hesitation for admission, I did strongly recommend IV antibiotics, serial abdominal exams, serial labs, IV fluids, and n.p.o. for now until pain improves.  Alternative is to attempt oral antibiotic and close monitoring as an outpatient basis, but based on the degree of inflammation noted on the CT scan, I strongly advised against this.  Patient eventually was agreeable to current plan.  We will continue current home medications for her comorbidities in the meantime.

## 2019-01-17 NOTE — ED Triage Notes (Signed)
Sore throat, cough and abdominal pain for 4 days now. Has not been around anyone sick, states negative Covid test end of April. NAD.

## 2019-01-17 NOTE — ED Provider Notes (Signed)
[facility] Emergency Department Provider Note       Time seen: ----------------------------------------- 12:58 PM on 01/17/2019 -----------------------------------------   I have reviewed the triage vital signs and the nursing notes.  HISTORY   Chief Complaint Cough; Sore Throat; and Abdominal Pain    HPI Melanie Bowen is a 58 y.o. female with a history of acute GI hemorrhage, COPD, depression, HIV, hypertension who presents to the ED for sore throat, cough and abdominal pain for the past 4 days.  She reports she has not been around anyone has been ill, had a negative coronavirus test at the end of April.  Currently she is supposed to be in pain management but her appointments have been delayed.  Past Medical History:  Diagnosis Date  . Acute GI hemorrhage 12/2015   required transfusion  . Allergic rhinitis   . Arthritis    DDD  . Asthma   . Blepharitis   . Bronchitis   . Cigarette smoker   . Claustrophobia   . COPD (chronic obstructive pulmonary disease) ([place])   . Cough   . DDD (degenerative disc disease), lumbar 04/10/2014  . Depression   . Diverticulosis   . Ectopic pregnancy   . Eye irritation   . Genital herpes   . GERD (gastroesophageal reflux disease)   . Headache   . HIV (human immunodeficiency virus infection) ([place])   . Hypertension   . Paresthesia of hand, bilateral   . Shortness of breath dyspnea    at times due to asthma    Patient Active Problem List   Diagnosis Date Noted  . Violation of controlled substance agreement 12/05/2017  . Positive urine drug screen (+cocaine) 12/05/2017  . Cervicalgia 12/05/2017  . Drug-seeking behavior 12/05/2017  . Community acquired pneumonia 11/28/2016  . Pneumonia 11/28/2016  . Allergic rhinitis 05/27/2016  . Tobacco abuse counseling 02/11/2016  . Symptomatic anemia   . Lower GI bleed   . Anemia 01/16/2016  . GI bleed 01/10/2016  . GERD (gastroesophageal reflux disease) 01/10/2016   . HTN (hypertension) 01/10/2016  . Depression 01/10/2016  . HIV (human immunodeficiency virus infection) ([place]) 01/10/2016  . Lumbar pseudoarthrosis 10/31/2015  . Chronic cough   . Cough 06/19/2015  . DDD (degenerative disc disease), lumbar 12/31/2014  . Degenerative disc disease, lumbar 12/31/2014  . Asthma, chronic 11/12/2014    Past Surgical History:  Procedure Laterality Date  . BACK SURGERY  12/2014   lumbar lam. Dr. Deri Fuelling  . BACK SURGERY  12/25/2012   Removal lumbosubarachnoid shunt system, L5-S1 TF ESI, Dr. Virl Axe Minchew  . back surgery may 2016     lumbar   . BUNIONECTOMY Bilateral    feet  . COLONOSCOPY  12/06/2014   rescheduled from 11/01/2014 due to poor prep  . DIAGNOSTIC LAPAROSCOPY    . DISTAL FEMUR OSTEOTOMY Right    Dr. Earnestine Leys  . ESOPHAGOGASTRODUODENOSCOPY Left 01/11/2016   Procedure: ESOPHAGOGASTRODUODENOSCOPY (EGD);  Surgeon: Hulen Luster, MD;  Location: [facility] ENDOSCOPY;  Service: Endoscopy;  Laterality: Left;  . Finger fracture Right    5th finger  . FRACTURE SURGERY Right 01/25/2014   closed reduction & percutaneous pinning of 5th digit  . HAND SURGERY Bilateral    carpal tunnel  . JOINT REPLACEMENT Bilateral    knees  . KNEE SURGERY Bilateral 2003,2007   total Joint  . LAPAROSCOPY FOR ECTOPIC PREGNANCY    . PERIPHERAL VASCULAR CATHETERIZATION N/A 01/12/2016   Procedure: Visceral Angiography;  Surgeon: Corene Cornea  Bunnie Domino, MD;  Location: [place] [facility];  Service: Cardiovascular;  Laterality: N/A;  . PERIPHERAL VASCULAR CATHETERIZATION N/A 01/12/2016   Procedure: Visceral Artery Intervention;  Surgeon: Algernon Huxley, MD;  Location: [place] [facility];  Service: Cardiovascular;  Laterality: N/A;  . REVISION TOTAL KNEE ARTHROPLASTY Right 12/31/2002   Dr. Marry Guan  . TUBAL LIGATION    . VIDEO BRONCHOSCOPY N/A 06/30/2015   Procedure: VIDEO BRONCHOSCOPY WITHOUT FLUORO;  Surgeon: Vilinda Boehringer, MD;  Location: [facility] ORS;  Service: Cardiopulmonary;   Laterality: N/A;    Allergies Morphine and related; Gabapentin; and Zanaflex  [tizanidine]  Social History Social History   Tobacco Use  . Smoking status: Current Some Day Smoker    Packs/day: 0.25    Years: 38.00    Pack years: 9.50    Types: Cigarettes  . Smokeless tobacco: Never Used  . Tobacco comment: 1 cig daily  Substance Use Topics  . Alcohol use: Yes    Alcohol/week: 5.0 standard drinks    Types: 5 Glasses of wine per week    Comment: wine - couple days of week  . Drug use: No    Comment: former   Review of Systems Constitutional: Negative for fever. HEENT: Positive for sore throat Cardiovascular: Negative for chest pain. Respiratory: Negative for shortness of breath.  Positive for cough Gastrointestinal: Positive for abdominal pain Musculoskeletal: Negative for back pain. Skin: Negative for rash. Neurological: Negative for headaches, focal weakness or numbness.  All systems negative/normal/unremarkable except as stated in the HPI  ____________________________________________   PHYSICAL EXAM:  VITAL SIGNS: ED Triage Vitals  Enc Vitals Group     BP 01/17/19 1225 (!) 141/75     Pulse Rate 01/17/19 1225 99     Resp 01/17/19 1225 18     Temp 01/17/19 1225 98.4 F (36.9 C)     Temp Source 01/17/19 1225 Oral     SpO2 01/17/19 1225 94 %     Weight 01/17/19 1225 290 lb (131.5 kg)     Height 01/17/19 1225 5\' 6"  (1.676 m)     Head Circumference --      Peak Flow --      Pain Score 01/17/19 1233 10     Pain Loc --      Pain Edu? --      Excl. in [place]? --    Constitutional: Alert and oriented. Well appearing and in no distress. Eyes: Conjunctivae are normal. Normal extraocular movements. ENT      Head: Normocephalic and atraumatic.      Nose: No congestion/rhinnorhea.      Mouth/Throat: Mucous membranes are moist.      Neck: No stridor. Cardiovascular: Normal rate, regular rhythm. No murmurs, rubs, or gallops. Respiratory: Normal respiratory effort  without tachypnea nor retractions. Breath sounds are clear and equal bilaterally. No wheezes/rales/rhonchi. Gastrointestinal: Diffuse, nonfocal abdominal tenderness, no rebound or guarding.  Normal bowel sounds. Musculoskeletal: Nontender with normal range of motion in extremities. No lower extremity tenderness nor edema. Neurologic:  Normal speech and language. No gross focal neurologic deficits are appreciated.  Skin:  Skin is warm, dry and intact. No rash noted. Psychiatric: Mood and affect are normal. Speech and behavior are normal.  ____________________________________________  ED COURSE:  As part of my medical decision making, I reviewed the following data within the [place] History obtained from family if available, nursing notes, old chart and ekg, as well as notes from prior ED visits. Patient presented for  generalized abdominal pain, we will assess with labs and imaging as indicated at this time.   Procedures  ARRIN ISHLER was evaluated in Emergency Department on 01/17/2019 for the symptoms described in the history of present illness. She was evaluated in the context of the global COVID-19 pandemic, which necessitated consideration that the patient might be at risk for infection with the SARS-CoV-2 virus that causes COVID-19. Institutional protocols and algorithms that pertain to the evaluation of patients at risk for COVID-19 are in a state of rapid change based on information released by regulatory bodies including the CDC and federal and state organizations. These policies and algorithms were followed during the patient's care in the ED.  ____________________________________________   LABS (pertinent positives/negatives)  Labs Reviewed  COMPREHENSIVE METABOLIC PANEL - Abnormal; Notable for the following components:      Result Value   Potassium 3.3 (*)    Glucose, Bld 112 (*)    Calcium 8.5 (*)    Albumin 3.0 (*)    All other components within normal  limits  CBC - Abnormal; Notable for the following components:   Hemoglobin 11.8 (*)    RDW 19.9 (*)    All other components within normal limits  URINALYSIS, COMPLETE (UACMP) WITH MICROSCOPIC - Abnormal; Notable for the following components:   Color, Urine YELLOW (*)    APPearance CLOUDY (*)    Hgb urine dipstick SMALL (*)    Leukocytes,Ua TRACE (*)    All other components within normal limits  LIPASE, BLOOD   CRITICAL CARE Performed by: Laurence Aly   Total critical care time: 30 minutes  Critical care time was exclusive of separately billable procedures and treating other patients.  Critical care was necessary to treat or prevent imminent or life-threatening deterioration.  Critical care was time spent personally by me on the following activities: development of treatment plan with patient and/or surrogate as well as nursing, discussions with consultants, evaluation of patient's response to treatment, examination of patient, obtaining history from patient or surrogate, ordering and performing treatments and interventions, ordering and review of laboratory studies, ordering and review of radiographic studies, pulse oximetry and re-evaluation of patient's condition.  RADIOLOGY Images were viewed by me  CT the abdomen pelvis with contrast IMPRESSION: 1. Acute diverticulitis involving the sigmoid colon and distal descending colon. There are multiple phlegmonous extraluminal collections consistent with developing abscesses. The largest is adjacent to the sigmoid colon and measures 4.2 x 1.8 cm. 2. Hepatomegaly with hepatic steatosis. 3.  Aortic Atherosclerosis (ICD10-I70.0).  ____________________________________________   DIFFERENTIAL DIAGNOSIS   Chronic pain, constipation, UTI, gastroenteritis  FINAL ASSESSMENT AND PLAN  Abdominal pain diverticulitis with perforation and abscess   Plan: The patient had presented for diffuse abdominal pain. Patient's labs are  normal. Patient's imaging did reveal acute diverticulitis of the sigmoid and distal descending colon with extraluminal collections consistent with abscesses.  I have ordered IV Zosyn for her.  I discussed with general surgery for admission.   Laurence Aly, MD    Note: This note was generated in part or whole with voice recognition software. Voice recognition is usually quite accurate but there are transcription errors that can and very often do occur. I apologize for any typographical errors that were not detected and corrected.     Earleen Newport, MD 01/17/19 [phone]

## 2019-01-17 NOTE — Progress Notes (Signed)
Patient pleasant, complains of 8/10 pain rating, but refused po medication.  "I'll wait for the IV stuff"; acknowledges that it can't be given until 2230.  "I'll wait".  Extra blanket given upon request. IV positional, arm propped up on pillow. Barbaraann Faster, RN 10:54 PM 01/17/2019

## 2019-01-18 LAB — CBC
HCT: 36.3 % (ref 36.0–46.0)
Hemoglobin: 11.7 g/dL — ABNORMAL LOW (ref 12.0–15.0)
MCH: 29.3 pg (ref 26.0–34.0)
MCHC: 32.2 g/dL (ref 30.0–36.0)
MCV: 91 fL (ref 80.0–100.0)
Platelets: 363 10*3/uL (ref 150–400)
RBC: 3.99 MIL/uL (ref 3.87–5.11)
RDW: 19.8 % — ABNORMAL HIGH (ref 11.5–15.5)
WBC: 8.2 10*3/uL (ref 4.0–10.5)
nRBC: 0 % (ref 0.0–0.2)

## 2019-01-18 LAB — PHOSPHORUS: Phosphorus: 3.4 mg/dL (ref 2.5–4.6)

## 2019-01-18 LAB — BASIC METABOLIC PANEL
Anion gap: 8 (ref 5–15)
BUN: 11 mg/dL (ref 6–20)
CO2: 27 mmol/L (ref 22–32)
Calcium: 8.6 mg/dL — ABNORMAL LOW (ref 8.9–10.3)
Chloride: 107 mmol/L (ref 98–111)
Creatinine, Ser: 0.74 mg/dL (ref 0.44–1.00)
GFR calc Af Amer: >60 mL/min (ref >60)
GFR calc non Af Amer: >60 mL/min (ref >60)
Glucose, Bld: 93 mg/dL (ref 70–99)
Potassium: 3.9 mmol/L (ref 3.5–5.1)
Sodium: 142 mmol/L (ref 135–145)

## 2019-01-18 LAB — MRSA PCR SCREENING: MRSA by PCR: NEGATIVE

## 2019-01-18 LAB — MAGNESIUM: Magnesium: 1.9 mg/dL (ref 1.7–2.4)

## 2019-01-18 MED ORDER — DOCUSATE SODIUM 100 MG PO CAPS: 100.0000 mg | ORAL_CAPSULE | Freq: Two times a day (BID) | ORAL | 0 refills | Status: AC | PRN

## 2019-01-18 MED ORDER — KCL IN DEXTROSE-NACL 40-5-0.45 MEQ/L-%-% IV SOLN
INTRAVENOUS | Status: DC
  Administered 2019-01-18 (×3): via INTRAVENOUS
  Filled 2019-01-18 (×3): qty 1000

## 2019-01-18 MED ORDER — ACETAMINOPHEN 325 MG PO TABS: 650.0000 mg | ORAL_TABLET | Freq: Three times a day (TID) | ORAL | 0 refills | Status: AC | PRN

## 2019-01-18 MED ORDER — METRONIDAZOLE 500 MG PO TABS: 500.0000 mg | ORAL_TABLET | Freq: Three times a day (TID) | ORAL | 0 refills | Status: AC

## 2019-01-18 MED ORDER — CIPROFLOXACIN HCL 500 MG PO TABS: 500.0000 mg | ORAL_TABLET | Freq: Two times a day (BID) | ORAL | 0 refills | Status: AC

## 2019-01-18 MED ORDER — HYDROCODONE-ACETAMINOPHEN 5-325 MG PO TABS
1.0000 | ORAL_TABLET | Freq: Four times a day (QID) | ORAL | Status: DC | PRN
  Administered 2019-01-18: 1 via ORAL
  Filled 2019-01-18: qty 1

## 2019-01-18 MED ORDER — VALACYCLOVIR HCL 500 MG PO TABS
500.0000 mg | ORAL_TABLET | Freq: Two times a day (BID) | ORAL | Status: DC
  Filled 2019-01-18: qty 1

## 2019-01-18 MED ORDER — HYDROCODONE-ACETAMINOPHEN 5-325 MG PO TABS: 1.0000 | ORAL_TABLET | Freq: Four times a day (QID) | ORAL | 0 refills | Status: AC | PRN

## 2019-01-18 MED ORDER — ACETAMINOPHEN 325 MG PO TABS
650.0000 mg | ORAL_TABLET | Freq: Once | ORAL | Status: AC
  Administered 2019-01-18: 650 mg via ORAL

## 2019-01-18 NOTE — Progress Notes (Signed)
MD notified: This patient has received norco and it has been working for now. She wants to know if she can have tylenol as well to help with pain instead of using IV pain meds. She also is hoping that you will be able to discharge as she indicates she tolerared clears and soft diet. She just has intermitten pain. Colace will be given for straining. Will you like to order anything else.

## 2019-01-18 NOTE — Discharge Summary (Signed)
Physician Discharge Summary  Patient ID: Melanie Bowen MRN: [id number] DOB/AGE: [birth year] 58 y.o.  Admit date: 01/17/2019 Discharge date: 01/18/2019  Admission Diagnoses: acute diverticulitis, large intestine  Discharge Diagnoses:  Same as above  Discharged Condition: good  Hospital Course: admitted for above.  CT concerning for developing abscess but patient pain well controlled with minimal meds, tolerating regular food, continues to have no leukocytosis.  Pt insistent she feels comfortable enough to be d/c'd and follow as outpt basis.  Clinical exam consistent with what is reported by patient.  Therefore, will switch to oral abx and continue to monitor as an outpt.  Return precautions given in discharge instructions  Consults: None  Discharge Exam: Blood pressure 128/71, pulse 82, temperature 98.2 F (36.8 C), temperature source Oral, resp. rate 18, height 5\' 6"  (1.676 m), weight 131.5 kg, SpO2 95 %. General appearance: alert, cooperative, no distress and morbidly obese GI: soft, non-tender; bowel sounds normal; no masses,  no organomegaly  Disposition:  Discharge disposition: 01-Home or Self Care       Discharge Instructions    Discharge patient   Complete by:  As directed    Discharge disposition:  01-Home or Self Care   Discharge patient date:  01/18/2019     Allergies as of 01/18/2019      Reactions   Morphine And Related Itching   Gabapentin Rash   Zanaflex  [tizanidine] Rash      Medication List    STOP taking these medications   dexlansoprazole 60 MG capsule Commonly known as:  DEXILANT     TAKE these medications   acetaminophen 325 MG tablet Commonly known as:  Tylenol Take 2 tablets (650 mg total) by mouth every 8 (eight) hours as needed for up to 30 days for mild pain.   albuterol 108 (90 Base) MCG/ACT inhaler Commonly known as:  VENTOLIN HFA Inhale 2 puffs into the lungs every 6 (six) hours as needed for wheezing or shortness of breath.    amLODipine 10 MG tablet Commonly known as:  NORVASC Take 10 mg by mouth daily.   ciprofloxacin 500 MG tablet Commonly known as:  CIPRO Take 1 tablet (500 mg total) by mouth 2 (two) times daily for 7 days.   docusate sodium 100 MG capsule Commonly known as:  Colace Take 1 capsule (100 mg total) by mouth 2 (two) times daily as needed for up to 10 days for mild constipation.   elvitegravir-cobicistat-emtricitabine-tenofovir 150-150-200-10 MG Tabs tablet Commonly known as:  GENVOYA Take 1 tablet by mouth at bedtime.   Fluticasone-Salmeterol 500-50 MCG/DOSE Aepb Commonly known as:  Advair Diskus Inhale 1 puff into the lungs 2 (two) times daily.   hydrochlorothiazide 25 MG tablet Commonly known as:  HYDRODIURIL Take 25 mg by mouth daily.   HYDROcodone-acetaminophen 5-325 MG tablet Commonly known as:  NORCO/VICODIN Take 1 tablet by mouth every 6 (six) hours as needed for up to 3 days for severe pain. What changed:    when to take this  reasons to take this   ipratropium-albuterol 0.5-2.5 (3) MG/3ML Soln Commonly known as:  DUONEB Inhale 3 mLs into the lungs 3 (three) times daily.   losartan 50 MG tablet Commonly known as:  COZAAR Take 50 mg by mouth daily.   metroNIDAZOLE 500 MG tablet Commonly known as:  Flagyl Take 1 tablet (500 mg total) by mouth 3 (three) times daily for 7 days.   montelukast 10 MG tablet Commonly known as:  SINGULAIR Take 10 mg by  mouth daily.   PreviDent 5000 Sensitive 1.1-5 % Pste Generic drug:  Sod Fluoride-Potassium Nitrate Apply 1 application topically 3 (three) times daily after meals.   promethazine 25 MG tablet Commonly known as:  PHENERGAN Take 25 mg by mouth every 6 (six) hours as needed for nausea or vomiting.      Follow-up Information    Lysle Pearl, Aurielle Slingerland, DO Follow up in 2 week(s).   Specialty:  Surgery Contact information: [place] [address] [phone]            Total time spent arranging  discharge was >37min. Signed: Benjamine Sprague 01/18/2019, 2:35 PM

## 2019-01-18 NOTE — Progress Notes (Signed)
Patient complaining of 8/10 pain in lower abdomen.  Patient got OOB and went around the bed, jerking the covers off her bed, looking for "... my mask.Marland KitchenMarland KitchenI can't find my mask..."  Showed her a mask on the bedside table, she stated, "... not that one...it's broken..."  When asked, to allow this RN to get to IV line to administer her pain medication, she angrily said, "... Can't you wait until I get back around to that side of the bed?!"  Waited patiently for patient to stop jerking the covers and come back around the bed.  Patient was muttering about, "... I got to get out of here...treated like this..."  When asked who was treating her bad, she stated, "... the nurses...";  Waited for patient to calm down and administered the pain medication; new mask given to patient.  IV continues to be positional with her bending her arm up, even with wrapping. Barbaraann Faster, RN 4:17 AM 01/18/2019

## 2019-01-18 NOTE — Progress Notes (Signed)
Subjective:  CC: Melanie Bowen is a 58 y.o. female  Hospital stay day 1,   acute diverticulitis  HPI: No issues overnight, but still requiring IV pain meds.  Hungry and will like to eat.  ROS:  General: Denies weight loss, weight gain, fatigue, fevers, chills, and night sweats. Heart: Denies chest pain, palpitations, racing heart, irregular heartbeat, leg pain or swelling, and decreased activity tolerance. Respiratory: Denies breathing difficulty, shortness of breath, wheezing, cough, and sputum. GI: Denies change in appetite, heartburn, nausea, vomiting, constipation, diarrhea, and blood in stool. GU: Denies difficulty urinating, pain with urinating, urgency, frequency, blood in urine.   Objective:   Temp:  [98 F (36.7 C)-98.7 F (37.1 C)] 98.1 F (36.7 C) (05/21 0435) Pulse Rate:  [67-99] 75 (05/21 0435) Resp:  [16-18] 18 (05/21 0435) BP: (136-152)/(63-85) 136/63 (05/21 0435) SpO2:  [94 %-100 %] 98 % (05/21 0435) Weight:  [131.5 kg] 131.5 kg (05/20 1225)     Height: 5\' 6"  (167.6 cm) Weight: 131.5 kg BMI (Calculated): 46.83   Intake/Output this shift:   Intake/Output Summary (Last 24 hours) at 01/18/2019 0950 Last data filed at 01/18/2019 8185 Gross per 24 hour  Intake 1111.93 ml  Output 1100 ml  Net 11.93 ml    Constitutional :  alert, cooperative, appears stated age and no distress  Respiratory:  clear to auscultation bilaterally  Cardiovascular:  regular rate and rhythm  Gastrointestinal: soft, non-tender; bowel sounds normal; no masses,  no organomegaly.   Skin: Cool and moist.   Psychiatric: Normal affect, non-agitated, not confused       LABS:  CMP Latest Ref Rng & Units 01/18/2019 01/17/2019 03/23/2018  Glucose 70 - 99 mg/dL 93 112(H) 113(H)  BUN 6 - 20 mg/dL 11 16 17   Creatinine 0.44 - 1.00 mg/dL 0.74 0.98 0.95  Sodium 135 - 145 mmol/L 142 138 138  Potassium 3.5 - 5.1 mmol/L 3.9 3.3(L) 3.3(L)  Chloride 98 - 111 mmol/L 107 104 106  CO2 22 - 32 mmol/L 27 25  22   Calcium 8.9 - 10.3 mg/dL 8.6(L) 8.5(L) 8.6(L)  Total Protein 6.5 - 8.1 g/dL - 6.7 -  Total Bilirubin 0.3 - 1.2 mg/dL - 0.6 -  Alkaline Phos 38 - 126 U/L - 104 -  AST 15 - 41 U/L - 17 -  ALT 0 - 44 U/L - 20 -   CBC Latest Ref Rng & Units 01/18/2019 01/17/2019 03/23/2018  WBC 4.0 - 10.5 K/uL 8.2 8.1 8.8  Hemoglobin 12.0 - 15.0 g/dL 11.7(L) 11.8(L) 12.0  Hematocrit 36.0 - 46.0 % 36.3 36.4 37.0  Platelets 150 - 400 K/uL 363 386 424    RADS: n/a Assessment:   Acute diverticulitis.  Pain improved, labs still wnl, will advance diet as tolerated and continue to monitor.  Encouraged patient to try oral narcotics for pain control.  Ideally, will like to be pain free prior to d/c but pt persistently insisting she needs to get out of hospital, becoming agitated at times, per report and documentation.  Will continue to monitor for now.  If pain controlled and no exacerbation with regular diet, maybe able to f/u as outpt.Marland KitchenMarland Kitchen

## 2019-01-18 NOTE — Discharge Instructions (Signed)
Diverticulitis  Diverticulitis is infection or inflammation of small pouches (diverticula) in the colon that form due to a condition called diverticulosis. Diverticula can trap stool (feces) and bacteria, causing infection and inflammation. Diverticulitis may cause severe stomach pain and diarrhea. It may lead to tissue damage in the colon that causes bleeding. The diverticula may also burst (rupture) and cause infected stool to enter other areas of the abdomen. Complications of diverticulitis can include:  Bleeding.  Severe infection.  Severe pain.  Rupture (perforation) of the colon.  Blockage (obstruction) of the colon. What are the causes? This condition is caused by stool becoming trapped in the diverticula, which allows bacteria to grow in the diverticula. This leads to inflammation and infection. What increases the risk? You are more likely to develop this condition if:  You have diverticulosis. The risk for diverticulosis increases if: ? You are overweight or obese. ? You use tobacco products. ? You do not get enough exercise.  You eat a diet that does not include enough fiber. High-fiber foods include fruits, vegetables, beans, nuts, and whole grains. What are the signs or symptoms? Symptoms of this condition may include:  Pain and tenderness in the abdomen. The pain is normally located on the left side of the abdomen, but it may occur in other areas.  Fever and chills.  Bloating.  Cramping.  Nausea.  Vomiting.  Changes in bowel routines.  Blood in your stool. How is this diagnosed? This condition is diagnosed based on:  Your medical history.  A physical exam.  Tests to make sure there is nothing else causing your condition. These tests may include: ? Blood tests. ? Urine tests. ? Imaging tests of the abdomen, including X-rays, ultrasounds, MRIs, or CT scans. How is this treated? Most cases of this condition are mild and can be treated at home.  Treatment may include:  Taking over-the-counter pain medicines.  Following a clear liquid diet.  Taking antibiotic medicines by mouth.  Rest. More severe cases may need to be treated at a hospital. Treatment may include:  Not eating or drinking.  Taking prescription pain medicine.  Receiving antibiotic medicines through an IV tube.  Receiving fluids and nutrition through an IV tube.  Surgery. When your condition is under control, your health care provider may recommend that you have a colonoscopy. This is an exam to look at the entire large intestine. During the exam, a lubricated, bendable tube is inserted into the anus and then passed into the rectum, colon, and other parts of the large intestine. A colonoscopy can show how severe your diverticula are and whether something else may be causing your symptoms. Follow these instructions at home: Medicines  tylenol and advil as needed for discomfort.  Please alternate between the two every four hours as needed for pain.    Use narcotics, if prescribed, only when tylenol and advil is not enough to control pain.  325-650mg  every 8hrs to max of 3000mg /24hrs (including the 325mg  in every norco dose) for the tylenol.    Advil up to 800mg  per dose every 8hrs as needed for pain.    If you were prescribed an antibiotic medicine, take it as told by your health care provider. Do not stop taking the antibiotic even if you start to feel better.  Do not drive or use heavy machinery while taking prescription pain medicine. General instructions  After your symptoms improve, your health care provider may tell you to change your diet. Eat a diet that contains  at least 25 g (25 grams) of fiber daily. Fiber makes it easier to pass stool. Healthy sources of fiber include: ? Berries. One cup contains 4-8 grams of fiber. ? Beans or lentils. One half cup contains 5-8 grams of fiber. ? Green vegetables. One cup contains 4 grams of fiber.  Exercise for at  least 30 minutes, 3 times each week. You should exercise hard enough to raise your heart rate and break a sweat.  Keep all follow-up visits as told by your health care provider. This is important. You may need a colonoscopy. Contact a health care provider if:  Your pain does not improve.  You have a hard time drinking or eating food.  Your bowel movements do not return to normal. Get help right away if:  Your pain gets worse.  Your symptoms do not get better with treatment.  Your symptoms suddenly get worse.  You have a fever.  You vomit more than one time.  You have stools that are bloody, black, or tarry. Summary  Diverticulitis is infection or inflammation of small pouches (diverticula) in the colon that form due to a condition called diverticulosis. Diverticula can trap stool (feces) and bacteria, causing infection and inflammation.  You are at higher risk for this condition if you have diverticulosis and you eat a diet that does not include enough fiber.  Most cases of this condition are mild and can be treated at home. More severe cases may need to be treated at a hospital.  When your condition is under control, your health care provider may recommend that you have an exam called a colonoscopy. This exam can show how severe your diverticula are and whether something else may be causing your symptoms. This information is not intended to replace advice given to you by your health care provider. Make sure you discuss any questions you have with your health care provider. Document Released: 05/26/2005 Document Revised: 09/18/2016 Document Reviewed: 09/18/2016 Elsevier Interactive Patient Education  2019 [employer].

## 2019-01-23 ENCOUNTER — Other Ambulatory Visit: Payer: Self-pay | Admitting: Surgery

## 2019-01-23 DIAGNOSIS — K5720 Diverticulitis of large intestine with perforation and abscess without bleeding: Secondary | ICD-10-CM

## 2019-01-26 ENCOUNTER — Telehealth: Payer: Self-pay | Admitting: General Surgery

## 2019-01-26 ENCOUNTER — Ambulatory Visit
Admission: RE | Admit: 2019-01-26 | Discharge: 2019-01-26 | Disposition: A | Discharge status: Home / Self Care | Payer: Medicare Other | Source: Ambulatory Visit | Attending: Surgery | Admitting: Surgery

## 2019-01-26 ENCOUNTER — Other Ambulatory Visit: Payer: Self-pay

## 2019-01-26 DIAGNOSIS — K5720 Diverticulitis of large intestine with perforation and abscess without bleeding: Secondary | ICD-10-CM

## 2019-01-26 MED ORDER — IOHEXOL 300 MG/ML  SOLN
100.0000 mL | Freq: Once | INTRAMUSCULAR | Status: AC | PRN
  Administered 2019-01-26: 100 mL via INTRAVENOUS

## 2019-01-27 NOTE — Telephone Encounter (Signed)
error 

## 2019-01-29 ENCOUNTER — Other Ambulatory Visit: Payer: Self-pay | Admitting: Surgery

## 2019-01-29 DIAGNOSIS — K5720 Diverticulitis of large intestine with perforation and abscess without bleeding: Secondary | ICD-10-CM

## 2019-01-31 ENCOUNTER — Ambulatory Visit
Admission: RE | Admit: 2019-01-31 | Discharge: 2019-01-31 | Disposition: A | Discharge status: Home / Self Care | Payer: Medicare Other | Source: Ambulatory Visit | Attending: Surgery | Admitting: Surgery

## 2019-01-31 ENCOUNTER — Other Ambulatory Visit: Payer: Self-pay

## 2019-01-31 DIAGNOSIS — K5720 Diverticulitis of large intestine with perforation and abscess without bleeding: Secondary | ICD-10-CM | POA: Diagnosis present

## 2019-01-31 MED ORDER — IOHEXOL 300 MG/ML  SOLN
150.0000 mL | Freq: Once | INTRAMUSCULAR | Status: AC | PRN
  Administered 2019-01-31: 125 mL via INTRAVENOUS

## 2019-02-18 ENCOUNTER — Emergency Department
Admission: EM | Admit: 2019-02-18 | Discharge: 2019-02-18 | Disposition: A | Discharge status: Home / Self Care | Payer: Medicare Other | Attending: Emergency Medicine | Admitting: Emergency Medicine

## 2019-02-18 ENCOUNTER — Other Ambulatory Visit: Payer: Self-pay

## 2019-02-18 DIAGNOSIS — M5489 Other dorsalgia: Secondary | ICD-10-CM | POA: Diagnosis present

## 2019-02-18 DIAGNOSIS — Z5321 Procedure and treatment not carried out due to patient leaving prior to being seen by health care provider: Secondary | ICD-10-CM | POA: Insufficient documentation

## 2019-02-18 NOTE — ED Triage Notes (Signed)
Pt complains of lumbar spine pain. Pt states "i'm here to get a shot for the pain so I can make it through to my appointment". Pt denies other symptoms and appears in no acute distress.

## 2019-04-12 ENCOUNTER — Other Ambulatory Visit: Payer: Self-pay | Admitting: Family Medicine

## 2019-04-12 DIAGNOSIS — Z1231 Encounter for screening mammogram for malignant neoplasm of breast: Secondary | ICD-10-CM

## 2019-05-21 ENCOUNTER — Emergency Department: Payer: Medicare Other

## 2019-05-21 ENCOUNTER — Other Ambulatory Visit: Payer: Self-pay

## 2019-05-21 ENCOUNTER — Encounter: Payer: Self-pay | Admitting: Emergency Medicine

## 2019-05-21 ENCOUNTER — Emergency Department
Admission: EM | Admit: 2019-05-21 | Discharge: 2019-05-21 | Disposition: A | Discharge status: Home / Self Care | Payer: Medicare Other | Attending: Student in an Organized Health Care Education/Training Program | Admitting: Student in an Organized Health Care Education/Training Program

## 2019-05-21 DIAGNOSIS — I700 Atherosclerosis of aorta: Secondary | ICD-10-CM | POA: Diagnosis not present

## 2019-05-21 DIAGNOSIS — I10 Essential (primary) hypertension: Secondary | ICD-10-CM | POA: Diagnosis not present

## 2019-05-21 DIAGNOSIS — Z20828 Contact with and (suspected) exposure to other viral communicable diseases: Secondary | ICD-10-CM | POA: Insufficient documentation

## 2019-05-21 DIAGNOSIS — Z21 Asymptomatic human immunodeficiency virus [HIV] infection status: Secondary | ICD-10-CM | POA: Diagnosis not present

## 2019-05-21 DIAGNOSIS — J189 Pneumonia, unspecified organism: Secondary | ICD-10-CM

## 2019-05-21 DIAGNOSIS — J181 Lobar pneumonia, unspecified organism: Secondary | ICD-10-CM | POA: Diagnosis not present

## 2019-05-21 DIAGNOSIS — J449 Chronic obstructive pulmonary disease, unspecified: Secondary | ICD-10-CM | POA: Diagnosis not present

## 2019-05-21 DIAGNOSIS — Z79899 Other long term (current) drug therapy: Secondary | ICD-10-CM | POA: Diagnosis not present

## 2019-05-21 DIAGNOSIS — J45909 Unspecified asthma, uncomplicated: Secondary | ICD-10-CM | POA: Insufficient documentation

## 2019-05-21 DIAGNOSIS — R05 Cough: Secondary | ICD-10-CM | POA: Diagnosis present

## 2019-05-21 DIAGNOSIS — F17210 Nicotine dependence, cigarettes, uncomplicated: Secondary | ICD-10-CM | POA: Diagnosis not present

## 2019-05-21 DIAGNOSIS — Z96653 Presence of artificial knee joint, bilateral: Secondary | ICD-10-CM | POA: Diagnosis not present

## 2019-05-21 LAB — BASIC METABOLIC PANEL
Anion gap: 8 (ref 5–15)
BUN: 23 mg/dL — ABNORMAL HIGH (ref 6–20)
CO2: 23 mmol/L (ref 22–32)
Calcium: 8.7 mg/dL — ABNORMAL LOW (ref 8.9–10.3)
Chloride: 105 mmol/L (ref 98–111)
Creatinine, Ser: 0.97 mg/dL (ref 0.44–1.00)
GFR calc Af Amer: >60 mL/min (ref >60)
GFR calc non Af Amer: >60 mL/min (ref >60)
Glucose, Bld: 114 mg/dL — ABNORMAL HIGH (ref 70–99)
Potassium: 4.6 mmol/L (ref 3.5–5.1)
Sodium: 136 mmol/L (ref 135–145)

## 2019-05-21 LAB — CBC
HCT: 39.1 % (ref 36.0–46.0)
Hemoglobin: 13.1 g/dL (ref 12.0–15.0)
MCH: 28.8 pg (ref 26.0–34.0)
MCHC: 33.5 g/dL (ref 30.0–36.0)
MCV: 85.9 fL (ref 80.0–100.0)
Platelets: 366 10*3/uL (ref 150–400)
RBC: 4.55 MIL/uL (ref 3.87–5.11)
RDW: 17.5 % — ABNORMAL HIGH (ref 11.5–15.5)
WBC: 16.3 10*3/uL — ABNORMAL HIGH (ref 4.0–10.5)
nRBC: 0 % (ref 0.0–0.2)

## 2019-05-21 MED ORDER — ACETAMINOPHEN 325 MG PO TABS
650.0000 mg | ORAL_TABLET | Freq: Once | ORAL | Status: AC
  Administered 2019-05-21: 650 mg via ORAL
  Filled 2019-05-21: qty 2

## 2019-05-21 MED ORDER — SULFAMETHOXAZOLE-TRIMETHOPRIM 800-160 MG PO TABS
1.0000 | ORAL_TABLET | Freq: Once | ORAL | Status: AC
  Administered 2019-05-21: 1 via ORAL
  Filled 2019-05-21: qty 1

## 2019-05-21 MED ORDER — SULFAMETHOXAZOLE-TRIMETHOPRIM 800-160 MG PO TABS: 1.0000 | ORAL_TABLET | Freq: Three times a day (TID) | ORAL | 0 refills | Status: DC

## 2019-05-21 MED ORDER — BENZONATATE 100 MG PO CAPS: 100.0000 mg | ORAL_CAPSULE | Freq: Three times a day (TID) | ORAL | 0 refills | Status: DC | PRN

## 2019-05-21 MED ORDER — LIDOCAINE HCL (PF) 1 % IJ SOLN
5.0000 mL | Freq: Once | INTRAMUSCULAR | Status: DC
  Filled 2019-05-21: qty 5

## 2019-05-21 MED ORDER — AZITHROMYCIN 250 MG PO TABS: ORAL_TABLET | ORAL | 0 refills | Status: DC

## 2019-05-21 MED ORDER — CEFTRIAXONE SODIUM 1 G IJ SOLR: 1.0000 g | Freq: Once | INTRAMUSCULAR | Status: DC

## 2019-05-21 MED ORDER — SULFAMETHOXAZOLE-TRIMETHOPRIM 800-160 MG PO TABS: 1.0000 | ORAL_TABLET | Freq: Two times a day (BID) | ORAL | 0 refills | Status: DC

## 2019-05-21 MED ORDER — AZITHROMYCIN 500 MG PO TABS
500.0000 mg | ORAL_TABLET | Freq: Once | ORAL | Status: AC
  Administered 2019-05-21: 500 mg via ORAL
  Filled 2019-05-21: qty 1

## 2019-05-21 MED ORDER — PREDNISONE 10 MG PO TABS: ORAL_TABLET | ORAL | 0 refills | Status: DC

## 2019-05-21 MED ORDER — PREDNISONE 20 MG PO TABS
60.0000 mg | ORAL_TABLET | Freq: Once | ORAL | Status: AC
  Administered 2019-05-21: 60 mg via ORAL
  Filled 2019-05-21: qty 3

## 2019-05-21 MED ORDER — ALBUTEROL SULFATE HFA 108 (90 BASE) MCG/ACT IN AERS: 2.0000 | INHALATION_SPRAY | Freq: Four times a day (QID) | RESPIRATORY_TRACT | 0 refills | Status: DC | PRN

## 2019-05-21 MED ORDER — SODIUM CHLORIDE 0.9 % IV SOLN
1.0000 g | Freq: Once | INTRAVENOUS | Status: AC
  Administered 2019-05-21: 1 g via INTRAVENOUS
  Filled 2019-05-21: qty 10

## 2019-05-21 MED ORDER — GUAIFENESIN-DM 100-10 MG/5ML PO SYRP: 5.0000 mL | ORAL_SOLUTION | ORAL | 0 refills | Status: DC | PRN

## 2019-05-21 MED ORDER — GUAIFENESIN-CODEINE 100-10 MG/5ML PO SYRP: 5.0000 mL | ORAL_SOLUTION | Freq: Three times a day (TID) | ORAL | 0 refills | Status: AC | PRN

## 2019-05-21 MED ORDER — ALBUTEROL SULFATE HFA 108 (90 BASE) MCG/ACT IN AERS: 2.0000 | INHALATION_SPRAY | Freq: Four times a day (QID) | RESPIRATORY_TRACT | 0 refills | Status: AC | PRN

## 2019-05-21 NOTE — Discharge Instructions (Addendum)
Your x-ray shows that you have pneumonia.  Please take two more doses of Bactrim today.  Begin azithromycin in the morning.  Use inhaler as needed every 4-6 hours.  Take Tessalon Perles for cough.  Return to the emergency department for worsening symptoms.  Please follow-up with primary care at your appointment today.

## 2019-05-21 NOTE — ED Notes (Signed)
See triage note  Presents with cough for the past few days  Denies nay fever or other problems  States she has hx of same  Has been using her inhalers and SVN at home with min

## 2019-05-21 NOTE — ED Triage Notes (Signed)
Pt c/o cough and chills xfew days. Denies fever or SOB. NAD noted

## 2019-05-21 NOTE — ED Provider Notes (Signed)
[facility] Emergency Department Provider Note  ____________________________________________  Time seen: Approximately 10:04 AM  I have reviewed the triage vital signs and the nursing notes.   HISTORY  Chief Complaint Cough    HPI GAYLYNN CANNONE is a 58 y.o. female with past medical history HIV, bronchitis, asthma, hypertension that presents emergency department for evaluation of nasal congestion, productive cough with clear sputum for 4 days.  Patient has had chills.  Patient states that she typically smokes 7 cigarettes over the course of the weekend and less during the week.  No sick contacts with COVID-19.  Patient has a history of bronchitis and allergies and states that this feels the exact same. She gets this yearly. She states that over-the-counter medications do not work for her and she usually has to come to the doctor for medications.  Patient currently takes medicine for HIV and states that her last levels were undetectable.  Patient had an appointment today with primary care at 2:30 PM but came to the emergency department instead for "fast results." No sore throat, shortness of breath, vomiting, abdominal pain, diarrhea.  Past Medical History:  Diagnosis Date  . Acute GI hemorrhage 12/2015   required transfusion  . Allergic rhinitis   . Arthritis    DDD  . Asthma   . Blepharitis   . Bronchitis   . Cigarette smoker   . Claustrophobia   . COPD (chronic obstructive pulmonary disease) ([place])   . Cough   . DDD (degenerative disc disease), lumbar 04/10/2014  . Depression   . Diverticulosis   . Ectopic pregnancy   . Eye irritation   . Genital herpes   . GERD (gastroesophageal reflux disease)   . Headache   . HIV (human immunodeficiency virus infection) ([place])   . Hypertension   . Paresthesia of hand, bilateral   . Shortness of breath dyspnea    at times due to asthma    Patient Active Problem List   Diagnosis Date Noted  . Diverticulitis  large intestine 01/17/2019  . Violation of controlled substance agreement 12/05/2017  . Positive urine drug screen (+cocaine) 12/05/2017  . Cervicalgia 12/05/2017  . Drug-seeking behavior 12/05/2017  . Community acquired pneumonia 11/28/2016  . Pneumonia 11/28/2016  . Allergic rhinitis 05/27/2016  . Tobacco abuse counseling 02/11/2016  . Symptomatic anemia   . Lower GI bleed   . Anemia 01/16/2016  . GI bleed 01/10/2016  . GERD (gastroesophageal reflux disease) 01/10/2016  . HTN (hypertension) 01/10/2016  . Depression 01/10/2016  . HIV (human immunodeficiency virus infection) ([place]) 01/10/2016  . Lumbar pseudoarthrosis 10/31/2015  . Chronic cough   . Cough 06/19/2015  . DDD (degenerative disc disease), lumbar 12/31/2014  . Degenerative disc disease, lumbar 12/31/2014  . Asthma, chronic 11/12/2014    Past Surgical History:  Procedure Laterality Date  . BACK SURGERY  12/2014   lumbar lam. Dr. Deri Fuelling  . BACK SURGERY  12/25/2012   Removal lumbosubarachnoid shunt system, L5-S1 TF ESI, Dr. Virl Axe Minchew  . back surgery may 2016     lumbar   . BUNIONECTOMY Bilateral    feet  . COLONOSCOPY  12/06/2014   rescheduled from 11/01/2014 due to poor prep  . DIAGNOSTIC LAPAROSCOPY    . DISTAL FEMUR OSTEOTOMY Right    Dr. Earnestine Leys  . ESOPHAGOGASTRODUODENOSCOPY Left 01/11/2016   Procedure: ESOPHAGOGASTRODUODENOSCOPY (EGD);  Surgeon: Hulen Luster, MD;  Location: [facility] ENDOSCOPY;  Service: Endoscopy;  Laterality: Left;  . Finger fracture Right  5th finger  . FRACTURE SURGERY Right 01/25/2014   closed reduction & percutaneous pinning of 5th digit  . HAND SURGERY Bilateral    carpal tunnel  . JOINT REPLACEMENT Bilateral    knees  . KNEE SURGERY Bilateral 2003,2007   total Joint  . LAPAROSCOPY FOR ECTOPIC PREGNANCY    . PERIPHERAL VASCULAR CATHETERIZATION N/A 01/12/2016   Procedure: Visceral Angiography;  Surgeon: Algernon Huxley, MD;  Location: [place] CV LAB;  Service:  Cardiovascular;  Laterality: N/A;  . PERIPHERAL VASCULAR CATHETERIZATION N/A 01/12/2016   Procedure: Visceral Artery Intervention;  Surgeon: Algernon Huxley, MD;  Location: [place] CV LAB;  Service: Cardiovascular;  Laterality: N/A;  . REVISION TOTAL KNEE ARTHROPLASTY Right 12/31/2002   Dr. Marry Guan  . TUBAL LIGATION    . VIDEO BRONCHOSCOPY N/A 06/30/2015   Procedure: VIDEO BRONCHOSCOPY WITHOUT FLUORO;  Surgeon: Vilinda Boehringer, MD;  Location: [facility] ORS;  Service: Cardiopulmonary;  Laterality: N/A;    Prior to Admission medications   Medication Sig Start Date End Date Taking? Authorizing Provider  albuterol (VENTOLIN HFA) 108 (90 Base) MCG/ACT inhaler Inhale 2 puffs into the lungs every 6 (six) hours as needed for wheezing or shortness of breath. 05/21/19   Laban Emperor, PA-C  amLODipine (NORVASC) 10 MG tablet Take 10 mg by mouth daily.     [provider]  azithromycin (ZITHROMAX Z-PAK) 250 MG tablet Take 2 tablets (500 mg) on  Day 1,  followed by 1 tablet (250 mg) once daily on Days 2 through 5. 05/21/19   Laban Emperor, PA-C  benzonatate (TESSALON PERLES) 100 MG capsule Take 1 capsule (100 mg total) by mouth 3 (three) times daily as needed. 05/21/19 05/20/20  Laban Emperor, PA-C  elvitegravir-cobicistat-emtricitabine-tenofovir (GENVOYA) 150-150-200-10 MG TABS tablet Take 1 tablet by mouth at bedtime.    [provider]  Fluticasone-Salmeterol (ADVAIR DISKUS) 500-50 MCG/DOSE AEPB Inhale 1 puff into the lungs 2 (two) times daily. 05/27/16   Mungal, Roxanne Mins, MD  guaiFENesin-codeine (ROBITUSSIN AC) 100-10 MG/5ML syrup Take 5 mLs by mouth 3 (three) times daily as needed for up to 2 days for cough. 05/21/19 05/23/19  Laban Emperor, PA-C  hydrochlorothiazide (HYDRODIURIL) 25 MG tablet Take 25 mg by mouth daily.    [provider]  ipratropium-albuterol (DUONEB) 0.5-2.5 (3) MG/3ML SOLN Inhale 3 mLs into the lungs 3 (three) times daily.     [provider]  losartan  (COZAAR) 50 MG tablet Take 50 mg by mouth daily.    [provider]  montelukast (SINGULAIR) 10 MG tablet Take 10 mg by mouth daily.    [provider]  predniSONE (DELTASONE) 10 MG tablet Take 6 tablets on day 1, take 5 tablets on day 2, take 4 tablets on day 3, take 3 tablets on day 4, take 2 tablets on day 5, take 1 tablet on day 6 05/21/19   Laban Emperor, PA-C  PREVIDENT 5000 SENSITIVE 1.1-5 % PSTE Apply 1 application topically 3 (three) times daily after meals. 01/15/19   [provider]  promethazine (PHENERGAN) 25 MG tablet Take 25 mg by mouth every 6 (six) hours as needed for nausea or vomiting.     [provider]  sulfamethoxazole-trimethoprim (BACTRIM DS) 800-160 MG tablet Take 1 tablet by mouth 3 (three) times daily for 10 days. 05/21/19 05/31/19  Laban Emperor, PA-C    Allergies Morphine and related, Gabapentin, and Zanaflex  [tizanidine]  Family History  Problem Relation Age of Onset  . Breast cancer Sister  Social History Social History   Tobacco Use  . Smoking status: Current Some Day Smoker    Packs/day: 0.25    Years: 38.00    Pack years: 9.50    Types: Cigarettes  . Smokeless tobacco: Never Used  . Tobacco comment: 1 cig daily  Substance Use Topics  . Alcohol use: Yes    Alcohol/week: 5.0 standard drinks    Types: 5 Glasses of wine per week    Comment: wine - couple days of week  . Drug use: No    Comment: former     Review of Systems  Constitutional: Positive for chills. Eyes: No visual changes. No discharge. ENT: Positive for congestion and rhinorrhea. Cardiovascular: No chest pain. Respiratory: Positive for cough. No SOB. Gastrointestinal: No abdominal pain.  No nausea, no vomiting.   Musculoskeletal: Negative for musculoskeletal pain. Skin: Negative for rash, abrasions, lacerations, ecchymosis. Neurological: Negative for headaches.   ____________________________________________   PHYSICAL EXAM:  VITAL  SIGNS: ED Triage Vitals  Enc Vitals Group     BP 05/21/19 0931 133/68     Pulse Rate 05/21/19 0930 81     Resp 05/21/19 0930 16     Temp 05/21/19 0930 98.7 F (37.1 C)     Temp Source 05/21/19 0930 Oral     SpO2 05/21/19 0930 99 %     Weight 05/21/19 0931 270 lb (122.5 kg)     Height 05/21/19 0931 5\' 6"  (1.676 m)     Head Circumference --      Peak Flow --      Pain Score 05/21/19 0931 0     Pain Loc --      Pain Edu? --      Excl. in [place]? --      Constitutional: Alert and oriented. Well appearing and in no acute distress. Eyes: Conjunctivae are normal. PERRL. EOMI. No discharge. Head: Atraumatic. ENT: No frontal and maxillary sinus tenderness.      Ears: Tympanic membranes pearly gray with good landmarks. No discharge.      Nose: Mild congestion/rhinnorhea.      Mouth/Throat: Mucous membranes are moist. Oropharynx non-erythematous. Tonsils not enlarged. No exudates. Uvula midline. Neck: No stridor.   Hematological/Lymphatic/Immunilogical: No cervical lymphadenopathy. Cardiovascular: Normal rate, regular rhythm.  Good peripheral circulation. Respiratory: Normal respiratory effort without tachypnea or retractions.  Minimal scattered wheezes. Good air entry to the bases with no decreased or absent breath sounds. Gastrointestinal: Bowel sounds 4 quadrants. Soft and nontender to palpation. No guarding or rigidity. No palpable masses. No distention. Musculoskeletal: Full range of motion to all extremities. No gross deformities appreciated. Neurologic:  Normal speech and language. No gross focal neurologic deficits are appreciated.  Skin:  Skin is warm, dry and intact. No rash noted. Psychiatric: Mood and affect are normal. Speech and behavior are normal. Patient exhibits appropriate insight and judgement.   ____________________________________________   LABS (all labs ordered are listed, but only abnormal results are displayed)  Labs Reviewed  CBC - Abnormal; Notable for the  following components:      Result Value   WBC 16.3 (*)    RDW 17.5 (*)    All other components within normal limits  BASIC METABOLIC PANEL - Abnormal; Notable for the following components:   Glucose, Bld 114 (*)    BUN 23 (*)    Calcium 8.7 (*)    All other components within normal limits  NOVEL CORONAVIRUS, NAA (HOSP ORDER, SEND-OUT TO REF LAB; TAT 18-24 HRS)  ____________________________________________  EKG   ____________________________________________  RADIOLOGY Robinette Haines, personally viewed and evaluated these images (plain radiographs) as part of my medical decision making, as well as reviewing the written report by the radiologist.  Dg Chest Portable 1 View  Result Date: 05/21/2019 CLINICAL DATA:  Cough and chills. EXAM: PORTABLE CHEST 1 VIEW COMPARISON:  One-view chest x-ray 01/17/2019 FINDINGS: The heart size is normal. Atherosclerotic calcifications are present at the aortic arch. Chronic interstitial coarsening is again noted. Asymmetric airspace opacities are present at the left base. No definite effusions are present. IMPRESSION: 1. New left basilar airspace opacities are concerning for infection. 2. Aortic atherosclerosis. Electronically Signed   By: San Morelle M.D.   On: 05/21/2019 10:27    ____________________________________________    PROCEDURES  Procedure(s) performed:    Procedures    Medications  lidocaine (PF) (XYLOCAINE) 1 % injection 5 mL (5 mLs Other Not Given 05/21/19 1224)  acetaminophen (TYLENOL) tablet 650 mg (650 mg Oral Given 05/21/19 1118)  azithromycin (ZITHROMAX) tablet 500 mg (500 mg Oral Given 05/21/19 1118)  cefTRIAXone (ROCEPHIN) 1 g in sodium chloride 0.9 % 100 mL IVPB (0 g Intravenous Stopped 05/21/19 1206)  sulfamethoxazole-trimethoprim (BACTRIM DS) 800-160 MG per tablet 1 tablet (1 tablet Oral Given 05/21/19 1224)  predniSONE (DELTASONE) tablet 60 mg (60 mg Oral Given 05/21/19 1236)      ____________________________________________   INITIAL IMPRESSION / ASSESSMENT AND PLAN / ED COURSE  Pertinent labs & imaging results that were available during my care of the patient were reviewed by me and considered in my medical decision making (see chart for details).  Review of the [place] CSRS was performed in accordance of the [place] prior to dispensing any controlled drugs.     Patient's diagnosis is consistent with pneumonia. Vital signs and exam are reassuring.  Chest x-ray concerning for pneumonia.  COVID test is pending.  Patient appears well and is staying well hydrated.  She is extremely talkative.  She is eating multiple graham crackers, several glasses of water and soda.  She has been talking on the phone while in the emergency department.  O2 satting between 98 and 99%. WBC elevated at 16.3.  Patient was given IV Rocephin, oral Bactrim and oral azithromycin for pneumonia in the emergency department.  She was given a dose of prednisone for minimal wheezes.  Patient declines staying in the hospital.  Patient states that she has somewhere to be at 1230.  She will return to the hospital for any worsening symptoms.  She is also going to follow-up with her primary care appointment this afternoon.  She requests multiple times to leave.  She  feels comfortable going home. Patient will be discharged home with prescriptions for Bactrim, Azithromycin, prednisone, albuterol inhaler, Tussionex. Patient is to follow up with PCP as needed or otherwise directed. Patient is given ED precautions to return to the ED for any worsening or new symptoms.  KELICIA PULS was evaluated in Emergency Department on 05/21/2019 for the symptoms described in the history of present illness. She was evaluated in the context of the global COVID-19 pandemic, which necessitated consideration that the patient might be at risk for infection with the SARS-CoV-2 virus that causes COVID-19. Institutional protocols and  algorithms that pertain to the evaluation of patients at risk for COVID-19 are in a state of rapid change based on information released by regulatory bodies including the CDC and federal and state organizations. These policies and algorithms were followed during the patient's  care in the ED.   ____________________________________________  FINAL CLINICAL IMPRESSION(S) / ED DIAGNOSES  Final diagnoses:  Community acquired pneumonia of left lower lobe of lung ([place])  Aortic atherosclerosis ([place])          This chart was dictated using voice recognition software/Dragon. Despite best efforts to proofread, errors can occur which can change the meaning. Any change was purely unintentional.    Laban Emperor, PA-C 05/21/19 1654    Merlyn Lot, MD 05/22/19 [id number]

## 2019-05-22 ENCOUNTER — Other Ambulatory Visit: Payer: Self-pay

## 2019-05-22 ENCOUNTER — Emergency Department: Payer: Medicare Other

## 2019-05-22 ENCOUNTER — Emergency Department
Admission: EM | Admit: 2019-05-22 | Discharge: 2019-05-22 | Disposition: A | Discharge status: Home / Self Care | Payer: Medicare Other | Attending: Emergency Medicine | Admitting: Emergency Medicine

## 2019-05-22 DIAGNOSIS — J449 Chronic obstructive pulmonary disease, unspecified: Secondary | ICD-10-CM | POA: Insufficient documentation

## 2019-05-22 DIAGNOSIS — J45909 Unspecified asthma, uncomplicated: Secondary | ICD-10-CM | POA: Diagnosis not present

## 2019-05-22 DIAGNOSIS — F17210 Nicotine dependence, cigarettes, uncomplicated: Secondary | ICD-10-CM | POA: Insufficient documentation

## 2019-05-22 DIAGNOSIS — Z96653 Presence of artificial knee joint, bilateral: Secondary | ICD-10-CM | POA: Insufficient documentation

## 2019-05-22 DIAGNOSIS — I10 Essential (primary) hypertension: Secondary | ICD-10-CM | POA: Insufficient documentation

## 2019-05-22 DIAGNOSIS — J209 Acute bronchitis, unspecified: Secondary | ICD-10-CM | POA: Diagnosis not present

## 2019-05-22 DIAGNOSIS — Z79899 Other long term (current) drug therapy: Secondary | ICD-10-CM | POA: Insufficient documentation

## 2019-05-22 DIAGNOSIS — R0602 Shortness of breath: Secondary | ICD-10-CM | POA: Diagnosis present

## 2019-05-22 DIAGNOSIS — R079 Chest pain, unspecified: Secondary | ICD-10-CM

## 2019-05-22 LAB — BLOOD GAS, VENOUS
Acid-base deficit: 3.2 mmol/L — ABNORMAL HIGH (ref 0.0–2.0)
Bicarbonate: 19.7 mmol/L — ABNORMAL LOW (ref 20.0–28.0)
O2 Saturation: 88.4 %
Patient temperature: 37
pCO2, Ven: 29 mmHg — ABNORMAL LOW (ref 44.0–60.0)
pH, Ven: 7.44 — ABNORMAL HIGH (ref 7.250–7.430)
pO2, Ven: 53 mmHg — ABNORMAL HIGH (ref 32.0–45.0)

## 2019-05-22 LAB — NOVEL CORONAVIRUS, NAA (HOSP ORDER, SEND-OUT TO REF LAB; TAT 18-24 HRS): SARS-CoV-2, NAA: NOT DETECTED

## 2019-05-22 LAB — COMPREHENSIVE METABOLIC PANEL
ALT: 21 U/L (ref 0–44)
AST: 17 U/L (ref 15–41)
Albumin: 3.1 g/dL — ABNORMAL LOW (ref 3.5–5.0)
Alkaline Phosphatase: 63 U/L (ref 38–126)
Anion gap: 10 (ref 5–15)
BUN: 25 mg/dL — ABNORMAL HIGH (ref 6–20)
CO2: 21 mmol/L — ABNORMAL LOW (ref 22–32)
Calcium: 8.7 mg/dL — ABNORMAL LOW (ref 8.9–10.3)
Chloride: 107 mmol/L (ref 98–111)
Creatinine, Ser: 0.78 mg/dL (ref 0.44–1.00)
GFR calc Af Amer: >60 mL/min (ref >60)
GFR calc non Af Amer: >60 mL/min (ref >60)
Glucose, Bld: 123 mg/dL — ABNORMAL HIGH (ref 70–99)
Potassium: 4.3 mmol/L (ref 3.5–5.1)
Sodium: 138 mmol/L (ref 135–145)
Total Bilirubin: 0.5 mg/dL (ref 0.3–1.2)
Total Protein: 6.3 g/dL — ABNORMAL LOW (ref 6.5–8.1)

## 2019-05-22 LAB — CBC
HCT: 37.7 % (ref 36.0–46.0)
Hemoglobin: 12.5 g/dL (ref 12.0–15.0)
MCH: 28.6 pg (ref 26.0–34.0)
MCHC: 33.2 g/dL (ref 30.0–36.0)
MCV: 86.3 fL (ref 80.0–100.0)
Platelets: 383 10*3/uL (ref 150–400)
RBC: 4.37 MIL/uL (ref 3.87–5.11)
RDW: 17.5 % — ABNORMAL HIGH (ref 11.5–15.5)
WBC: 19.8 10*3/uL — ABNORMAL HIGH (ref 4.0–10.5)
nRBC: 0 % (ref 0.0–0.2)

## 2019-05-22 LAB — TROPONIN I (HIGH SENSITIVITY)
Troponin I (High Sensitivity): 5 ng/L (ref <18)
Troponin I (High Sensitivity): 5 ng/L (ref <18)

## 2019-05-22 LAB — LACTIC ACID, PLASMA: Lactic Acid, Venous: 1.8 mmol/L (ref 0.5–1.9)

## 2019-05-22 MED ORDER — IPRATROPIUM-ALBUTEROL 0.5-2.5 (3) MG/3ML IN SOLN
3.0000 mL | Freq: Once | RESPIRATORY_TRACT | Status: DC
  Filled 2019-05-22: qty 3

## 2019-05-22 MED ORDER — HYDROCOD POLST-CPM POLST ER 10-8 MG/5ML PO SUER
5.0000 mL | Freq: Once | ORAL | Status: AC
  Administered 2019-05-22: 5 mL via ORAL
  Filled 2019-05-22: qty 5

## 2019-05-22 MED ORDER — IOHEXOL 350 MG/ML SOLN
75.0000 mL | Freq: Once | INTRAVENOUS | Status: AC | PRN
  Administered 2019-05-22: 75 mL via INTRAVENOUS

## 2019-05-22 MED ORDER — SODIUM CHLORIDE 0.9 % IV SOLN
500.0000 mg | INTRAVENOUS | Status: DC
  Administered 2019-05-22: 500 mg via INTRAVENOUS
  Filled 2019-05-22: qty 500

## 2019-05-22 MED ORDER — HYDROCOD POLST-CPM POLST ER 10-8 MG/5ML PO SUER: 5.0000 mL | Freq: Two times a day (BID) | ORAL | 0 refills | Status: DC | PRN

## 2019-05-22 MED ORDER — SODIUM CHLORIDE 0.9 % IV SOLN
2.0000 g | INTRAVENOUS | Status: DC
  Administered 2019-05-22: 2 g via INTRAVENOUS
  Filled 2019-05-22: qty 20

## 2019-05-22 MED ORDER — POLYETHYLENE GLYCOL 3350 17 G PO PACK: 17.0000 g | PACK | Freq: Every day | ORAL | 0 refills | Status: DC | PRN

## 2019-05-22 NOTE — ED Notes (Signed)
Patient transported to CT 

## 2019-05-22 NOTE — ED Notes (Signed)
ED Provider at bedside. 

## 2019-05-22 NOTE — ED Provider Notes (Signed)
[facility] Emergency Department Provider Note   First MD Initiated Contact with Patient 05/22/19 [phone]     (approximate)  I have reviewed the triage vital signs and the nursing notes.   HISTORY  Chief Complaint Shortness of Breath   HPI Melanie Bowen is a 58 y.o. female with below list of previous medical conditions including HIV which patient states her last viral load was undetectable and recently diagnosed pneumonia yesterday here in this emergency department returns to the emergency department secondary to worsening symptoms.  Patient admits to worsening dyspnea cough and chest discomfort.  Patient afebrile on presentation temperature 98.2.  Patient denies any lower extremity pain or swelling.  Patient admits to chest pain with coughing.        Past Medical History:  Diagnosis Date  . Acute GI hemorrhage 12/2015   required transfusion  . Allergic rhinitis   . Arthritis    DDD  . Asthma   . Blepharitis   . Bronchitis   . Cigarette smoker   . Claustrophobia   . COPD (chronic obstructive pulmonary disease) ([place])   . Cough   . DDD (degenerative disc disease), lumbar 04/10/2014  . Depression   . Diverticulosis   . Ectopic pregnancy   . Eye irritation   . Genital herpes   . GERD (gastroesophageal reflux disease)   . Headache   . HIV (human immunodeficiency virus infection) ([place])   . Hypertension   . Paresthesia of hand, bilateral   . Shortness of breath dyspnea    at times due to asthma    Patient Active Problem List   Diagnosis Date Noted  . Diverticulitis large intestine 01/17/2019  . Violation of controlled substance agreement 12/05/2017  . Positive urine drug screen (+cocaine) 12/05/2017  . Cervicalgia 12/05/2017  . Drug-seeking behavior 12/05/2017  . Community acquired pneumonia 11/28/2016  . Pneumonia 11/28/2016  . Allergic rhinitis 05/27/2016  . Tobacco abuse counseling 02/11/2016  . Symptomatic anemia   . Lower GI bleed    . Anemia 01/16/2016  . GI bleed 01/10/2016  . GERD (gastroesophageal reflux disease) 01/10/2016  . HTN (hypertension) 01/10/2016  . Depression 01/10/2016  . HIV (human immunodeficiency virus infection) ([place]) 01/10/2016  . Lumbar pseudoarthrosis 10/31/2015  . Chronic cough   . Cough 06/19/2015  . DDD (degenerative disc disease), lumbar 12/31/2014  . Degenerative disc disease, lumbar 12/31/2014  . Asthma, chronic 11/12/2014    Past Surgical History:  Procedure Laterality Date  . BACK SURGERY  12/2014   lumbar lam. Dr. Deri Fuelling  . BACK SURGERY  12/25/2012   Removal lumbosubarachnoid shunt system, L5-S1 TF ESI, Dr. Virl Axe Minchew  . back surgery may 2016     lumbar   . BUNIONECTOMY Bilateral    feet  . COLONOSCOPY  12/06/2014   rescheduled from 11/01/2014 due to poor prep  . DIAGNOSTIC LAPAROSCOPY    . DISTAL FEMUR OSTEOTOMY Right    Dr. Earnestine Leys  . ESOPHAGOGASTRODUODENOSCOPY Left 01/11/2016   Procedure: ESOPHAGOGASTRODUODENOSCOPY (EGD);  Surgeon: Hulen Luster, MD;  Location: [facility] ENDOSCOPY;  Service: Endoscopy;  Laterality: Left;  . Finger fracture Right    5th finger  . FRACTURE SURGERY Right 01/25/2014   closed reduction & percutaneous pinning of 5th digit  . HAND SURGERY Bilateral    carpal tunnel  . JOINT REPLACEMENT Bilateral    knees  . KNEE SURGERY Bilateral 2003,2007   total Joint  . LAPAROSCOPY FOR ECTOPIC PREGNANCY    .  PERIPHERAL VASCULAR CATHETERIZATION N/A 01/12/2016   Procedure: Visceral Angiography;  Surgeon: Algernon Huxley, MD;  Location: [facility];  Service: Cardiovascular;  Laterality: N/A;  . PERIPHERAL VASCULAR CATHETERIZATION N/A 01/12/2016   Procedure: Visceral Artery Intervention;  Surgeon: Algernon Huxley, MD;  Location: [facility];  Service: Cardiovascular;  Laterality: N/A;  . REVISION TOTAL KNEE ARTHROPLASTY Right 12/31/2002   Dr. Marry Guan  . TUBAL LIGATION    . VIDEO BRONCHOSCOPY N/A 06/30/2015   Procedure: VIDEO BRONCHOSCOPY  WITHOUT FLUORO;  Surgeon: Vilinda Boehringer, MD;  Location: [facility] ORS;  Service: Cardiopulmonary;  Laterality: N/A;    Prior to Admission medications   Medication Sig Start Date End Date Taking? Authorizing Provider  albuterol (VENTOLIN HFA) 108 (90 Base) MCG/ACT inhaler Inhale 2 puffs into the lungs every 6 (six) hours as needed for wheezing or shortness of breath. 05/21/19   Laban Emperor, PA-C  amLODipine (NORVASC) 10 MG tablet Take 10 mg by mouth daily.     [provider]  azithromycin (ZITHROMAX Z-PAK) 250 MG tablet Take 2 tablets (500 mg) on  Day 1,  followed by 1 tablet (250 mg) once daily on Days 2 through 5. 05/21/19   Laban Emperor, PA-C  benzonatate (TESSALON PERLES) 100 MG capsule Take 1 capsule (100 mg total) by mouth 3 (three) times daily as needed. 05/21/19 05/20/20  Laban Emperor, PA-C  chlorpheniramine-HYDROcodone (TUSSIONEX PENNKINETIC ER) 10-8 MG/5ML SUER Take 5 mLs by mouth every 12 (twelve) hours as needed. 05/22/19   Gregor Hams, MD  DEXILANT 60 MG capsule Take 60 mg by mouth daily. 04/25/19   [provider]  elvitegravir-cobicistat-emtricitabine-tenofovir (GENVOYA) 150-150-200-10 MG TABS tablet Take 1 tablet by mouth at bedtime.    [provider]  famotidine (PEPCID) 20 MG tablet Take 20 mg by mouth 2 (two) times daily. 05/08/19   [provider]  Fluticasone-Salmeterol (ADVAIR DISKUS) 500-50 MCG/DOSE AEPB Inhale 1 puff into the lungs 2 (two) times daily. 05/27/16   Mungal, Roxanne Mins, MD  gabapentin (NEURONTIN) 300 MG capsule Take 300 mg by mouth 3 (three) times daily. 05/08/19   [provider]  guaiFENesin-codeine (ROBITUSSIN AC) 100-10 MG/5ML syrup Take 5 mLs by mouth 3 (three) times daily as needed for up to 2 days for cough. 05/21/19 05/23/19  Laban Emperor, PA-C  hydrochlorothiazide (HYDRODIURIL) 25 MG tablet Take 25 mg by mouth daily.    [provider]  ipratropium-albuterol (DUONEB) 0.5-2.5 (3) MG/3ML SOLN Inhale 3 mLs  into the lungs 3 (three) times daily.     [provider]  losartan (COZAAR) 50 MG tablet Take 50 mg by mouth daily.    [provider]  montelukast (SINGULAIR) 10 MG tablet Take 10 mg by mouth daily.    [provider]  polyethylene glycol (MIRALAX) 17 g packet Take 17 g by mouth daily as needed for moderate constipation. 05/22/19   Gregor Hams, MD  predniSONE (DELTASONE) 10 MG tablet Take 6 tablets on day 1, take 5 tablets on day 2, take 4 tablets on day 3, take 3 tablets on day 4, take 2 tablets on day 5, take 1 tablet on day 6 05/21/19   Laban Emperor, PA-C  PREVIDENT 5000 SENSITIVE 1.1-5 % PSTE Apply 1 application topically 3 (three) times daily after meals. 01/15/19   [provider]  promethazine (PHENERGAN) 25 MG tablet Take 25 mg by mouth every 6 (six) hours as needed for nausea or vomiting.     [provider]  sulfamethoxazole-trimethoprim (BACTRIM DS) 800-160 MG tablet Take 1 tablet by mouth 3 (three) times daily for 10 days. 05/21/19 05/31/19  Laban Emperor, PA-C  valACYclovir (VALTREX) 1000 MG tablet Take 1,000 mg by mouth daily. 04/25/19   [provider]    Allergies Morphine and related, Gabapentin, and Zanaflex  [tizanidine]  Family History  Problem Relation Age of Onset  . Breast cancer Sister     Social History Social History   Tobacco Use  . Smoking status: Current Some Day Smoker    Packs/day: 0.25    Years: 38.00    Pack years: 9.50    Types: Cigarettes  . Smokeless tobacco: Never Used  . Tobacco comment: 1 cig daily  Substance Use Topics  . Alcohol use: Yes    Alcohol/week: 5.0 standard drinks    Types: 5 Glasses of wine per week    Comment: wine - couple days of week  . Drug use: No    Comment: former    Review of Systems Constitutional: No fever/chills Eyes: No visual changes. ENT: No sore throat. Cardiovascular: Positive for chest pain. Respiratory: Positive for cough and shortness of  breath. Gastrointestinal: No abdominal pain.  No nausea, no vomiting.  No diarrhea.  No constipation. Genitourinary: Negative for dysuria. Musculoskeletal: Negative for neck pain.  Negative for back pain. Integumentary: Negative for rash. Neurological: Negative for headaches, focal weakness or numbness.   ____________________________________________   PHYSICAL EXAM:  VITAL SIGNS: ED Triage Vitals  Enc Vitals Group     BP 05/22/19 0109 137/72     Pulse Rate 05/22/19 0109 81     Resp 05/22/19 0109 (!) 24     Temp 05/22/19 0109 98.2 F (36.8 C)     Temp Source 05/22/19 0109 Oral     SpO2 05/22/19 0109 96 %     Weight 05/22/19 0110 122.5 kg (270 lb)     Height 05/22/19 0110 1.676 m (5\' 6" )     Head Circumference --      Peak Flow --      Pain Score 05/22/19 0113 9     Pain Loc --      Pain Edu? --      Excl. in [place]? --     Constitutional: Alert and oriented.  Coughing Eyes: Conjunctivae are normal.  Mouth/Throat: Mucous membranes are moist. Neck: No stridor.  No meningeal signs.   Cardiovascular: Normal rate, regular rhythm. Good peripheral circulation. Grossly normal heart sounds. Respiratory: Normal respiratory effort.  No retractions. Gastrointestinal: Soft and nontender. No distention.  Musculoskeletal: No lower extremity tenderness nor edema. No gross deformities of extremities. Neurologic:  Normal speech and language. No gross focal neurologic deficits are appreciated.  Skin:  Skin is warm, dry and intact. Psychiatric: Mood and affect are normal. Speech and behavior are normal.  ____________________________________________   LABS (all labs ordered are listed, but only abnormal results are displayed)  Labs Reviewed  CBC - Abnormal; Notable for the following components:      Result Value   WBC 19.8 (*)    RDW 17.5 (*)    All other components within normal limits  COMPREHENSIVE METABOLIC PANEL - Abnormal; Notable for the following components:   CO2 21 (*)     Glucose, Bld 123 (*)    BUN 25 (*)    Calcium 8.7 (*)    Total Protein 6.3 (*)    Albumin 3.1 (*)    All other components within normal limits  CULTURE,  BLOOD (ROUTINE X 2)  CULTURE, BLOOD (ROUTINE X 2)  LACTIC ACID, PLASMA  LACTIC ACID, PLASMA  BLOOD GAS, VENOUS  TROPONIN I (HIGH SENSITIVITY)  TROPONIN I (HIGH SENSITIVITY)   ____________________________________________  EKG  ED ECG REPORT I, [place] N BROWN, the attending physician, personally viewed and interpreted this ECG.   Date: 05/22/2019  EKG Time: 9 AM  Rate: 87  Rhythm: Normal sinus rhythm  Axis: Normal  Intervals: Normal  ST&T Change: None  ____________________________________________  RADIOLOGY I, [place] N BROWN, personally viewed and evaluated these images (plain radiographs) as part of my medical decision making, as well as reviewing the written report by the radiologist.  ED MD interpretation: Suboptimal CTA without any evidence of pulmonary emboli generalized bronchitic changes noted on CT of the chest.  Chest x-ray revealed no evidence of pneumonia today per radiologist.  Official radiology report(s): Ct Angio Chest Pe W And/or Wo Contrast  Result Date: 05/22/2019 CLINICAL DATA:  Chest pain EXAM: CT ANGIOGRAPHY CHEST WITH CONTRAST TECHNIQUE: Multidetector CT imaging of the chest was performed using the standard protocol during bolus administration of intravenous contrast. Multiplanar CT image reconstructions and MIPs were obtained to evaluate the vascular anatomy. CONTRAST:  70mL OMNIPAQUE IOHEXOL 350 MG/ML SOLN COMPARISON:  03/19/2018 FINDINGS: Cardiovascular: Suboptimal pulmonary artery opacification due to bolus dispersion and soft tissue attenuation. There is reasonable visualization to the segmental level with no evidence of pulmonary embolism. No cardiomegaly or pericardial effusion. Coronary calcification. Mediastinum/Nodes: Negative for adenopathy or mass. Lungs/Pleura: Airway thickening and  narrowing diffusely. Mild interstitial thickening in the apical lungs that is chronic and has a scar-like appearance. There is no edema, consolidation, effusion, or pneumothorax. Upper Abdomen: Negative Musculoskeletal: Small simple, intramuscular lipoma along the medial right scapular border. Review of the MIP images confirms the above findings. IMPRESSION: 1. Suboptimal CTA with no evidence of pulmonary embolism. 2. Generalized bronchitic airway thickening and luminal narrowing. 3. Coronary atherosclerosis. Electronically Signed   By: Monte Fantasia M.D.   On: 05/22/2019 06:10   Dg Chest Port 1 View  Result Date: 05/22/2019 CLINICAL DATA:  72 you are female with chest pain. EXAM: PORTABLE CHEST 1 VIEW COMPARISON:  Chest radiograph dated 05/21/2019 FINDINGS: No definite consolidative changes. There is no pleural effusion or pneumothorax. The cardiac silhouette is within normal limits. No acute osseous pathology. IMPRESSION: No active disease. Electronically Signed   By: Anner Crete M.D.   On: 05/22/2019 03:41   Dg Chest Portable 1 View  Result Date: 05/21/2019 CLINICAL DATA:  Cough and chills. EXAM: PORTABLE CHEST 1 VIEW COMPARISON:  One-view chest x-ray 01/17/2019 FINDINGS: The heart size is normal. Atherosclerotic calcifications are present at the aortic arch. Chronic interstitial coarsening is again noted. Asymmetric airspace opacities are present at the left base. No definite effusions are present. IMPRESSION: 1. New left basilar airspace opacities are concerning for infection. 2. Aortic atherosclerosis. Electronically Signed   By: San Morelle M.D.   On: 05/21/2019 10:27     Procedures   ____________________________________________   INITIAL IMPRESSION / MDM / [place] / ED COURSE  As part of my medical decision making, I reviewed the following data within the electronic MEDICAL RECORD NUMBER   58 year old female presented with above-stated history and  physical exam concerning for pneumonia versus bronchitis versus COVID-19 infection.  Patient was seen yesterday and diagnosed with pneumonia based on left lower lobe findings on chest x-ray.  Chest x-ray today revealed no evidence of pneumonia.  CT of the chest  performed consistent with bronchitic changes.  COVID-19 still pending.  Patient given Tussionex in the emergency department as well as ceftriaxone and azithromycin. ____________________________________________  FINAL CLINICAL IMPRESSION(S) / ED DIAGNOSES  Final diagnoses:  Acute bronchitis, unspecified organism     MEDICATIONS GIVEN DURING THIS VISIT:  Medications  cefTRIAXone (ROCEPHIN) 2 g in sodium chloride 0.9 % 100 mL IVPB (0 g Intravenous Stopped 05/22/19 0457)  azithromycin (ZITHROMAX) 500 mg in sodium chloride 0.9 % 250 mL IVPB (0 mg Intravenous Stopped 05/22/19 0555)  ipratropium-albuterol (DUONEB) 0.5-2.5 (3) MG/3ML nebulizer solution 3 mL (3 mLs Nebulization Not Given 05/22/19 0606)  ipratropium-albuterol (DUONEB) 0.5-2.5 (3) MG/3ML nebulizer solution 3 mL (3 mLs Nebulization Not Given 05/22/19 0605)  chlorpheniramine-HYDROcodone (TUSSIONEX) 10-8 MG/5ML suspension 5 mL (5 mLs Oral Given 05/22/19 0414)  iohexol (OMNIPAQUE) 350 MG/ML injection 75 mL (75 mLs Intravenous Contrast Given 05/22/19 0531)     ED Discharge Orders         Ordered    chlorpheniramine-HYDROcodone (TUSSIONEX PENNKINETIC ER) 10-8 MG/5ML SUER  Every 12 hours PRN     05/22/19 0619    polyethylene glycol (MIRALAX) 17 g packet  Daily PRN,   Status:  Discontinued     05/22/19 0619    polyethylene glycol (MIRALAX) 17 g packet  Daily PRN     05/22/19 [id number]          *Please note:  CHANTEE MUMMEY was evaluated in Emergency Department on 05/22/2019 for the symptoms described in the history of present illness. She was evaluated in the context of the global COVID-19 pandemic, which necessitated consideration that the patient might be at risk for infection with  the SARS-CoV-2 virus that causes COVID-19. Institutional protocols and algorithms that pertain to the evaluation of patients at risk for COVID-19 are in a state of rapid change based on information released by regulatory bodies including the CDC and federal and state organizations. These policies and algorithms were followed during the patient's care in the ED.  Some ED evaluations and interventions may be delayed as a result of limited staffing during the pandemic.*  Note:  This document was prepared using Dragon voice recognition software and may include unintentional dictation errors.   Gregor Hams, MD 05/22/19 0630

## 2019-05-22 NOTE — ED Notes (Signed)
Patient states was here yesterday and was told she has pneumonia, since then is having SOB and chest pain.

## 2019-05-22 NOTE — Progress Notes (Signed)
CODE SEPSIS - PHARMACY COMMUNICATION  **Broad Spectrum Antibiotics should be administered within 1 hour of Sepsis diagnosis**  Time Code Sepsis Called/Page Received: 0350  Antibiotics Ordered: azithro/ceftriaxone  Time of 1st antibiotic administration: 0421  Additional action taken by pharmacy:   If necessary, Name of Provider/Nurse Contacted:     Tobie Lords ,PharmD Clinical Pharmacist  05/22/2019  5:07 AM

## 2019-05-22 NOTE — ED Notes (Signed)
Patient refused to shut door to receive a breathing treatment due to Airborne precautions. EDP made aware. Patient had personal inhaler she received yesterday and was given permission to use by EDP.

## 2019-05-22 NOTE — ED Triage Notes (Signed)
Pt here with chest pain and shob, was dx with pneumonia here yesterday. Pt states was told to return with any worsening symptoms, co worsening shob and pain. Pt is also pending Covid results.

## 2019-05-24 ENCOUNTER — Encounter: Payer: Self-pay | Admitting: Emergency Medicine

## 2019-05-24 ENCOUNTER — Emergency Department
Admission: EM | Admit: 2019-05-24 | Discharge: 2019-05-24 | Disposition: A | Discharge status: Home / Self Care | Payer: Medicare Other | Attending: Emergency Medicine | Admitting: Emergency Medicine

## 2019-05-24 ENCOUNTER — Other Ambulatory Visit: Payer: Self-pay

## 2019-05-24 ENCOUNTER — Emergency Department: Payer: Medicare Other

## 2019-05-24 DIAGNOSIS — I10 Essential (primary) hypertension: Secondary | ICD-10-CM | POA: Diagnosis not present

## 2019-05-24 DIAGNOSIS — F17210 Nicotine dependence, cigarettes, uncomplicated: Secondary | ICD-10-CM | POA: Insufficient documentation

## 2019-05-24 DIAGNOSIS — J449 Chronic obstructive pulmonary disease, unspecified: Secondary | ICD-10-CM | POA: Diagnosis not present

## 2019-05-24 DIAGNOSIS — J40 Bronchitis, not specified as acute or chronic: Secondary | ICD-10-CM | POA: Diagnosis not present

## 2019-05-24 DIAGNOSIS — Z21 Asymptomatic human immunodeficiency virus [HIV] infection status: Secondary | ICD-10-CM | POA: Diagnosis not present

## 2019-05-24 DIAGNOSIS — Z79899 Other long term (current) drug therapy: Secondary | ICD-10-CM | POA: Diagnosis not present

## 2019-05-24 DIAGNOSIS — R0602 Shortness of breath: Secondary | ICD-10-CM | POA: Insufficient documentation

## 2019-05-24 DIAGNOSIS — R05 Cough: Secondary | ICD-10-CM | POA: Diagnosis present

## 2019-05-24 DIAGNOSIS — Z96653 Presence of artificial knee joint, bilateral: Secondary | ICD-10-CM | POA: Diagnosis not present

## 2019-05-24 LAB — BASIC METABOLIC PANEL
Anion gap: 8 (ref 5–15)
BUN: 24 mg/dL — ABNORMAL HIGH (ref 6–20)
CO2: 21 mmol/L — ABNORMAL LOW (ref 22–32)
Calcium: 8.6 mg/dL — ABNORMAL LOW (ref 8.9–10.3)
Chloride: 108 mmol/L (ref 98–111)
Creatinine, Ser: 1.06 mg/dL — ABNORMAL HIGH (ref 0.44–1.00)
GFR calc Af Amer: >60 mL/min (ref >60)
GFR calc non Af Amer: 58 mL/min — ABNORMAL LOW (ref >60)
Glucose, Bld: 109 mg/dL — ABNORMAL HIGH (ref 70–99)
Potassium: 4.8 mmol/L (ref 3.5–5.1)
Sodium: 137 mmol/L (ref 135–145)

## 2019-05-24 LAB — CBC
HCT: 37.8 % (ref 36.0–46.0)
Hemoglobin: 12.3 g/dL (ref 12.0–15.0)
MCH: 28.7 pg (ref 26.0–34.0)
MCHC: 32.5 g/dL (ref 30.0–36.0)
MCV: 88.1 fL (ref 80.0–100.0)
Platelets: 213 10*3/uL (ref 150–400)
RBC: 4.29 MIL/uL (ref 3.87–5.11)
RDW: 18.4 % — ABNORMAL HIGH (ref 11.5–15.5)
WBC: 19.4 10*3/uL — ABNORMAL HIGH (ref 4.0–10.5)
nRBC: 0 % (ref 0.0–0.2)

## 2019-05-24 LAB — BRAIN NATRIURETIC PEPTIDE: B Natriuretic Peptide: 55 pg/mL (ref 0.0–100.0)

## 2019-05-24 LAB — TROPONIN I (HIGH SENSITIVITY): Troponin I (High Sensitivity): 5 ng/L (ref <18)

## 2019-05-24 MED ORDER — HYDROCODONE-ACETAMINOPHEN 5-325 MG PO TABS: 1.0000 | ORAL_TABLET | Freq: Four times a day (QID) | ORAL | 0 refills | Status: DC | PRN

## 2019-05-24 NOTE — ED Triage Notes (Signed)
Pt in via POV, reports ongoing chest pain, shortness of breath, diaphoresis.  States this is the third time she has been here this week; started on antibiotics Monday without any relief.  NAD noted at this time.

## 2019-05-24 NOTE — Discharge Instructions (Addendum)
STOP use of your prescription cough syrup as we discussed (Tussionex)

## 2019-05-24 NOTE — ED Provider Notes (Signed)
[facility] Emergency Department Provider Note  ____________________________________________   None    (approximate)  I have reviewed the triage vital signs and the nursing notes.   HISTORY  Chief Complaint Chest Pain   HPI Melanie Bowen is a 58 y.o. female here for evaluation of ongoing cough  Patient reports for several days now she has been experiencing ongoing cough shortness of breath with walking and gets little sweaty when she walks.  She reports she is continue to work part-time.  She has been on antibiotic and has 1 day remaining of azithromycin.  She was initially also taking Bactrim, but was asking if she might feel discontinue that now they know she does not have pneumonia from a previous visit  She had a CT scan on the 22nd.  She is overall feeling about the same, but reports she just feels short of breath especially when he is up walking.  Has her inhaler and nebulizer at home she is on occasionally  Reports an achiness across the chest.  Says she cannot take ibuprofen or NSAIDs because of previous problems with stomach ulcers and bleeding  She is also on Tussionex but reports she is not really using it   Past Medical History:  Diagnosis Date  . Acute GI hemorrhage 12/2015   required transfusion  . Allergic rhinitis   . Arthritis    DDD  . Asthma   . Blepharitis   . Bronchitis   . Cigarette smoker   . Claustrophobia   . COPD (chronic obstructive pulmonary disease) ([place])   . Cough   . DDD (degenerative disc disease), lumbar 04/10/2014  . Depression   . Diverticulosis   . Ectopic pregnancy   . Eye irritation   . Genital herpes   . GERD (gastroesophageal reflux disease)   . Headache   . HIV (human immunodeficiency virus infection) ([place])   . Hypertension   . Paresthesia of hand, bilateral   . Shortness of breath dyspnea    at times due to asthma    Patient Active Problem List   Diagnosis Date Noted  . Diverticulitis  large intestine 01/17/2019  . Violation of controlled substance agreement 12/05/2017  . Positive urine drug screen (+cocaine) 12/05/2017  . Cervicalgia 12/05/2017  . Drug-seeking behavior 12/05/2017  . Community acquired pneumonia 11/28/2016  . Pneumonia 11/28/2016  . Allergic rhinitis 05/27/2016  . Tobacco abuse counseling 02/11/2016  . Symptomatic anemia   . Lower GI bleed   . Anemia 01/16/2016  . GI bleed 01/10/2016  . GERD (gastroesophageal reflux disease) 01/10/2016  . HTN (hypertension) 01/10/2016  . Depression 01/10/2016  . HIV (human immunodeficiency virus infection) ([place]) 01/10/2016  . Lumbar pseudoarthrosis 10/31/2015  . Chronic cough   . Cough 06/19/2015  . DDD (degenerative disc disease), lumbar 12/31/2014  . Degenerative disc disease, lumbar 12/31/2014  . Asthma, chronic 11/12/2014    Past Surgical History:  Procedure Laterality Date  . BACK SURGERY  12/2014   lumbar lam. Dr. Deri Fuelling  . BACK SURGERY  12/25/2012   Removal lumbosubarachnoid shunt system, L5-S1 TF ESI, Dr. Virl Axe Minchew  . back surgery may 2016     lumbar   . BUNIONECTOMY Bilateral    feet  . COLONOSCOPY  12/06/2014   rescheduled from 11/01/2014 due to poor prep  . DIAGNOSTIC LAPAROSCOPY    . DISTAL FEMUR OSTEOTOMY Right    Dr. Earnestine Leys  . ESOPHAGOGASTRODUODENOSCOPY Left 01/11/2016   Procedure: ESOPHAGOGASTRODUODENOSCOPY (EGD);  Surgeon: Hulen Luster, MD;  Location: [facility] ENDOSCOPY;  Service: Endoscopy;  Laterality: Left;  . Finger fracture Right    5th finger  . FRACTURE SURGERY Right 01/25/2014   closed reduction & percutaneous pinning of 5th digit  . HAND SURGERY Bilateral    carpal tunnel  . JOINT REPLACEMENT Bilateral    knees  . KNEE SURGERY Bilateral 2003,2007   total Joint  . LAPAROSCOPY FOR ECTOPIC PREGNANCY    . PERIPHERAL VASCULAR CATHETERIZATION N/A 01/12/2016   Procedure: Visceral Angiography;  Surgeon: Algernon Huxley, MD;  Location: [place] [facility];  Service:  Cardiovascular;  Laterality: N/A;  . PERIPHERAL VASCULAR CATHETERIZATION N/A 01/12/2016   Procedure: Visceral Artery Intervention;  Surgeon: Algernon Huxley, MD;  Location: [place] [facility];  Service: Cardiovascular;  Laterality: N/A;  . REVISION TOTAL KNEE ARTHROPLASTY Right 12/31/2002   Dr. Marry Guan  . TUBAL LIGATION    . VIDEO BRONCHOSCOPY N/A 06/30/2015   Procedure: VIDEO BRONCHOSCOPY WITHOUT FLUORO;  Surgeon: Vilinda Boehringer, MD;  Location: [facility] ORS;  Service: Cardiopulmonary;  Laterality: N/A;    Prior to Admission medications   Medication Sig Start Date End Date Taking? Authorizing Provider  albuterol (VENTOLIN HFA) 108 (90 Base) MCG/ACT inhaler Inhale 2 puffs into the lungs every 6 (six) hours as needed for wheezing or shortness of breath. 05/21/19   Laban Emperor, PA-C  amLODipine (NORVASC) 10 MG tablet Take 10 mg by mouth daily.     [provider]  azithromycin (ZITHROMAX Z-PAK) 250 MG tablet Take 2 tablets (500 mg) on  Day 1,  followed by 1 tablet (250 mg) once daily on Days 2 through 5. 05/21/19   Laban Emperor, PA-C  benzonatate (TESSALON PERLES) 100 MG capsule Take 1 capsule (100 mg total) by mouth 3 (three) times daily as needed. 05/21/19 05/20/20  Laban Emperor, PA-C  DEXILANT 60 MG capsule Take 60 mg by mouth daily. 04/25/19   [provider]  elvitegravir-cobicistat-emtricitabine-tenofovir (GENVOYA) 150-150-200-10 MG TABS tablet Take 1 tablet by mouth at bedtime.    [provider]  famotidine (PEPCID) 20 MG tablet Take 20 mg by mouth 2 (two) times daily. 05/08/19   [provider]  Fluticasone-Salmeterol (ADVAIR DISKUS) 500-50 MCG/DOSE AEPB Inhale 1 puff into the lungs 2 (two) times daily. 05/27/16   Mungal, Roxanne Mins, MD  gabapentin (NEURONTIN) 300 MG capsule Take 300 mg by mouth 3 (three) times daily. 05/08/19   [provider]  hydrochlorothiazide (HYDRODIURIL) 25 MG tablet Take 25 mg by mouth daily.    [provider]   HYDROcodone-acetaminophen (NORCO/VICODIN) 5-325 MG tablet Take 1-2 tablets by mouth every 6 (six) hours as needed for moderate pain. 05/24/19   Delman Kitten, MD  ipratropium-albuterol (DUONEB) 0.5-2.5 (3) MG/3ML SOLN Inhale 3 mLs into the lungs 3 (three) times daily.     [provider]  losartan (COZAAR) 50 MG tablet Take 50 mg by mouth daily.    [provider]  montelukast (SINGULAIR) 10 MG tablet Take 10 mg by mouth daily.    [provider]  polyethylene glycol (MIRALAX) 17 g packet Take 17 g by mouth daily as needed for moderate constipation. 05/22/19   Gregor Hams, MD  predniSONE (DELTASONE) 10 MG tablet Take 6 tablets on day 1, take 5 tablets on day 2, take 4 tablets on day 3, take 3 tablets on day 4, take 2 tablets on day 5, take 1 tablet on day 6 05/21/19   Laban Emperor, PA-C  PREVIDENT 5000 SENSITIVE 1.1-5 % PSTE Apply 1 application topically 3 (three) times daily after meals. 01/15/19   [provider]  promethazine (PHENERGAN) 25 MG tablet Take 25 mg by mouth every 6 (six) hours as needed for nausea or vomiting.     [provider]  valACYclovir (VALTREX) 1000 MG tablet Take 1,000 mg by mouth daily. 04/25/19   [provider]    Allergies Gabapentin, Morphine and related, and Zanaflex  [tizanidine]  Family History  Problem Relation Age of Onset  . Breast cancer Sister     Social History Social History   Tobacco Use  . Smoking status: Current Some Day Smoker    Packs/day: 0.25    Years: 38.00    Pack years: 9.50    Types: Cigarettes  . Smokeless tobacco: Never Used  . Tobacco comment: 1 cig daily  Substance Use Topics  . Alcohol use: Yes    Alcohol/week: 5.0 standard drinks    Types: 5 Glasses of wine per week  . 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  She has been tested for covert several times and  reports she tested negative each time Eyes: No visual changes. ENT: No sore throat. Cardiovascular: See HPI Respiratory: See HPI.  Shortness of breath with walking. Gastrointestinal: No abdominal pain.   Genitourinary: Negative for dysuria. Musculoskeletal: Negative for back pain. Skin: Negative for rash. Neurological: Negative for headaches, areas of focal weakness or numbness.    ____________________________________________   PHYSICAL EXAM:  VITAL SIGNS: ED Triage Vitals  Enc Vitals Group     BP 05/24/19 1559 (!) 172/51     Pulse Rate 05/24/19 1559 78     Resp 05/24/19 1559 20     Temp 05/24/19 1559 98.4 F (36.9 C)     Temp Source 05/24/19 1559 Oral     SpO2 05/24/19 1559 94 %     Weight 05/24/19 1555 270 lb (122.5 kg)     Height 05/24/19 1555 5\' 6"  (1.676 m)     Head Circumference --      Peak Flow --      Pain Score 05/24/19 1555 9     Pain Loc --      Pain Edu? --      Excl. in [place]? --     Constitutional: Alert and oriented. Well appearing and in no acute distress. Eyes: Conjunctivae are normal. Head: Atraumatic. Nose: No congestion/rhinnorhea. Mouth/Throat: Mucous membranes are moist. Neck: No stridor.  Cardiovascular: Normal rate, regular rhythm. Grossly normal heart sounds.  Good peripheral circulation.  Respiratory: Normal respiratory effort.  No retractions. Lungs CTAB except for some slight central rhonchi. Gastrointestinal: Soft and nontender. No distention. Musculoskeletal: No lower extremity tenderness nor edema. Neurologic:  Normal speech and language. No gross focal neurologic deficits are appreciated.  Skin:  Skin is warm, dry and intact. No rash noted. Psychiatric: Mood and affect are normal. Speech and behavior are normal.  ____________________________________________   LABS (all labs ordered are listed, but only abnormal results are displayed)  Labs Reviewed  BASIC METABOLIC PANEL - Abnormal; Notable for the following components:       Result Value   CO2 21 (*)    Glucose, Bld 109 (*)    BUN 24 (*)    Creatinine, Ser 1.06 (*)    Calcium 8.6 (*)    GFR calc non Af  Amer 58 (*)    All other components within normal limits  CBC - Abnormal; Notable for the following components:   WBC 19.4 (*)    RDW 18.4 (*)    All other components within normal limits  BRAIN NATRIURETIC PEPTIDE  TROPONIN I (HIGH SENSITIVITY)   ____________________________________________  EKG  Reviewed interpreted at 1600 Heart rate 100 QRS 60 QTc 460 Sinus tachycardia, no evidence of ischemia. ____________________________________________  RADIOLOGY  Dg Chest 2 View  Result Date: 05/24/2019 CLINICAL DATA:  Pt in via POV, reports ongoing chest pain, shortness of breath, diaphoresis. States this is the third time she has been here this week; started on antibiotics Monday without any relief. Given two different diagnoses. Pneumonia and bronchitis. EXAM: CHEST - 2 VIEW COMPARISON:  05/22/2019 FINDINGS: Cardiac silhouette normal in size. No mediastinal or hilar masses. No evidence of adenopathy. Lungs are clear.  No pleural effusion or pneumothorax. Skeletal structures are intact. IMPRESSION: No active cardiopulmonary disease. Electronically Signed   By: Lajean Manes M.D.   On: 05/24/2019 16:23     Personally viewed x-ray, no acute findings noted ____________________________________________   PROCEDURES  Procedure(s) performed: None  Procedures  Critical Care performed: No  ____________________________________________   INITIAL IMPRESSION / ASSESSMENT AND PLAN / ED COURSE  Pertinent labs & imaging results that were available during my care of the patient were reviewed by me and considered in my medical decision making (see chart for details).   Patient presents with ongoing symptoms of cough.  Some chest tightness.  Reassuring EKG and troponin, clinical history she reports chest tightness for several days with coughing.  Is experiencing  some shortness of breath with exertion.  However when he got up and ambulated her saturations improved and she started to feel better.  Very reassuring examination.  Leukocytosis, but blood cultures negative to date and she also has been on steroid which I suspect is driving some of the leukocytosis.  She is afebrile.  Reassuring exam, no noted increased work of breathing.  She appears to be an appropriate treatment, discussed with her, and I do not see indication for to continue on Bactrim which she will stop.  She would like something for the chest discomfort for which I will switch her to hydrocodone, discussed risks and she will stop use of her Tussionex and discard that medication  Clinical Course as of May 23 1713  Thu May 24, 2019  1622 COVID negative.  Bronchitic changes on CT from the 22nd.  Blood cultures no growth to date.   [MQ]    Clinical Course User Index [MQ] Delman Kitten, MD   IJAHNAE GUGEL was evaluated in Emergency Department on 05/24/2019 for the symptoms described in the history of present illness. She was evaluated in the context of the global COVID-19 pandemic, which necessitated consideration that the patient might be at risk for infection with the SARS-CoV-2 virus that causes COVID-19. Institutional protocols and algorithms that pertain to the evaluation of patients at risk for COVID-19 are in a state of rapid change based on information released by regulatory bodies including the CDC and federal and state organizations. These policies and algorithms were followed during the patient's care in the ED.  Has tested negative for COVID  She did not wish to be tested the fifth time  ____________________________________________   FINAL CLINICAL IMPRESSION(S) / ED DIAGNOSES  Final diagnoses:  Bronchitis        Note:  This document was prepared using Dragon voice  recognition software and may include unintentional dictation errors       Delman Kitten, MD 05/24/19  1714

## 2019-05-24 NOTE — ED Notes (Signed)
SATURATION QUALIFICATIONS  Patient Saturations on Room Air at Rest = 97-98%  Patient Saturations on Room Air while Ambulating = 100%

## 2019-05-27 LAB — CULTURE, BLOOD (ROUTINE X 2)
Culture: NO GROWTH
Culture: NO GROWTH

## 2019-05-30 ENCOUNTER — Ambulatory Visit
Admission: RE | Admit: 2019-05-30 | Discharge: 2019-05-30 | Disposition: A | Discharge status: Home / Self Care | Payer: Medicare Other | Source: Ambulatory Visit | Attending: Family Medicine | Admitting: Family Medicine

## 2019-05-30 DIAGNOSIS — Z1231 Encounter for screening mammogram for malignant neoplasm of breast: Secondary | ICD-10-CM | POA: Diagnosis not present

## 2019-06-02 ENCOUNTER — Encounter: Payer: Self-pay | Admitting: Emergency Medicine

## 2019-06-02 ENCOUNTER — Emergency Department: Payer: Medicare Other

## 2019-06-02 ENCOUNTER — Emergency Department
Admission: EM | Admit: 2019-06-02 | Discharge: 2019-06-02 | Disposition: A | Discharge status: Home / Self Care | Payer: Medicare Other | Attending: Emergency Medicine | Admitting: Emergency Medicine

## 2019-06-02 ENCOUNTER — Other Ambulatory Visit: Payer: Self-pay

## 2019-06-02 DIAGNOSIS — Y999 Unspecified external cause status: Secondary | ICD-10-CM | POA: Insufficient documentation

## 2019-06-02 DIAGNOSIS — Y939 Activity, unspecified: Secondary | ICD-10-CM | POA: Diagnosis not present

## 2019-06-02 DIAGNOSIS — S0083XA Contusion of other part of head, initial encounter: Secondary | ICD-10-CM | POA: Diagnosis not present

## 2019-06-02 DIAGNOSIS — I10 Essential (primary) hypertension: Secondary | ICD-10-CM | POA: Diagnosis not present

## 2019-06-02 DIAGNOSIS — Y929 Unspecified place or not applicable: Secondary | ICD-10-CM | POA: Diagnosis not present

## 2019-06-02 DIAGNOSIS — F17210 Nicotine dependence, cigarettes, uncomplicated: Secondary | ICD-10-CM | POA: Insufficient documentation

## 2019-06-02 DIAGNOSIS — J449 Chronic obstructive pulmonary disease, unspecified: Secondary | ICD-10-CM | POA: Diagnosis not present

## 2019-06-02 DIAGNOSIS — Z79899 Other long term (current) drug therapy: Secondary | ICD-10-CM | POA: Diagnosis not present

## 2019-06-02 DIAGNOSIS — Z21 Asymptomatic human immunodeficiency virus [HIV] infection status: Secondary | ICD-10-CM | POA: Diagnosis not present

## 2019-06-02 DIAGNOSIS — S0990XA Unspecified injury of head, initial encounter: Secondary | ICD-10-CM | POA: Diagnosis present

## 2019-06-02 MED ORDER — IBUPROFEN 600 MG PO TABS
600.0000 mg | ORAL_TABLET | Freq: Once | ORAL | Status: DC
  Filled 2019-06-02: qty 1

## 2019-06-02 MED ORDER — OXYCODONE HCL 5 MG PO TABS
5.0000 mg | ORAL_TABLET | Freq: Once | ORAL | Status: AC
  Administered 2019-06-02: 5 mg via ORAL
  Filled 2019-06-02: qty 1

## 2019-06-02 MED ORDER — OXYCODONE HCL 5 MG PO TABS: 5.0000 mg | ORAL_TABLET | Freq: Four times a day (QID) | ORAL | 0 refills | Status: DC | PRN

## 2019-06-02 MED ORDER — OXYCODONE HCL 5 MG PO CAPS: 10.0000 mg | ORAL_CAPSULE | ORAL | 0 refills | Status: DC | PRN

## 2019-06-02 MED ORDER — OXYCODONE HCL 5 MG PO CAPS: 10.0000 mg | ORAL_CAPSULE | Freq: Four times a day (QID) | ORAL | 0 refills | Status: DC | PRN
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
## 2019-06-02 NOTE — ED Notes (Signed)
[place] PD here speaking with pt currently

## 2019-06-05 ENCOUNTER — Emergency Department
Admission: EM | Admit: 2019-06-05 | Discharge: 2019-06-05 | Disposition: A | Discharge status: Home / Self Care | Payer: Medicare Other | Attending: Emergency Medicine | Admitting: Emergency Medicine

## 2019-06-05 ENCOUNTER — Other Ambulatory Visit: Payer: Self-pay

## 2019-06-05 ENCOUNTER — Encounter: Payer: Self-pay | Admitting: Intensive Care

## 2019-06-05 DIAGNOSIS — I10 Essential (primary) hypertension: Secondary | ICD-10-CM | POA: Insufficient documentation

## 2019-06-05 DIAGNOSIS — Z79899 Other long term (current) drug therapy: Secondary | ICD-10-CM | POA: Insufficient documentation

## 2019-06-05 DIAGNOSIS — G44309 Post-traumatic headache, unspecified, not intractable: Secondary | ICD-10-CM | POA: Insufficient documentation

## 2019-06-05 DIAGNOSIS — F17210 Nicotine dependence, cigarettes, uncomplicated: Secondary | ICD-10-CM | POA: Insufficient documentation

## 2019-06-05 DIAGNOSIS — R519 Headache, unspecified: Secondary | ICD-10-CM | POA: Diagnosis present

## 2019-06-05 DIAGNOSIS — J449 Chronic obstructive pulmonary disease, unspecified: Secondary | ICD-10-CM | POA: Insufficient documentation

## 2019-06-05 DIAGNOSIS — B20 Human immunodeficiency virus [HIV] disease: Secondary | ICD-10-CM | POA: Insufficient documentation

## 2019-06-05 DIAGNOSIS — H538 Other visual disturbances: Secondary | ICD-10-CM | POA: Diagnosis not present

## 2019-06-05 MED ORDER — BUTALBITAL-APAP-CAFFEINE 50-325-40 MG PO TABS: 1.0000 | ORAL_TABLET | Freq: Four times a day (QID) | ORAL | 0 refills | Status: DC | PRN

## 2019-06-05 MED ORDER — TRAMADOL HCL 50 MG PO TABS: 50.0000 mg | ORAL_TABLET | Freq: Four times a day (QID) | ORAL | 0 refills | Status: DC | PRN
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
## 2019-06-05 NOTE — ED Notes (Signed)
Pt given ice pack. Pt upset and didn't sign discharge paperwork. Pt ambulatory with steady gait at this time.
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
## 2019-07-04 ENCOUNTER — Other Ambulatory Visit: Payer: Self-pay | Admitting: Family Medicine

## 2019-07-04 DIAGNOSIS — Z1231 Encounter for screening mammogram for malignant neoplasm of breast: Secondary | ICD-10-CM

## 2019-07-29 ENCOUNTER — Emergency Department
Admission: EM | Admit: 2019-07-29 | Discharge: 2019-07-29 | Disposition: A | Discharge status: Home / Self Care | Payer: Medicare Other | Attending: Emergency Medicine | Admitting: Emergency Medicine

## 2019-07-29 ENCOUNTER — Other Ambulatory Visit: Payer: Self-pay

## 2019-07-29 ENCOUNTER — Encounter: Payer: Self-pay | Admitting: Intensive Care

## 2019-07-29 DIAGNOSIS — Z21 Asymptomatic human immunodeficiency virus [HIV] infection status: Secondary | ICD-10-CM | POA: Diagnosis not present

## 2019-07-29 DIAGNOSIS — Z20828 Contact with and (suspected) exposure to other viral communicable diseases: Secondary | ICD-10-CM | POA: Diagnosis not present

## 2019-07-29 DIAGNOSIS — G8929 Other chronic pain: Secondary | ICD-10-CM | POA: Diagnosis not present

## 2019-07-29 DIAGNOSIS — M5441 Lumbago with sciatica, right side: Secondary | ICD-10-CM | POA: Insufficient documentation

## 2019-07-29 DIAGNOSIS — J449 Chronic obstructive pulmonary disease, unspecified: Secondary | ICD-10-CM | POA: Insufficient documentation

## 2019-07-29 DIAGNOSIS — F17210 Nicotine dependence, cigarettes, uncomplicated: Secondary | ICD-10-CM | POA: Insufficient documentation

## 2019-07-29 DIAGNOSIS — Z20822 Contact with and (suspected) exposure to covid-19: Secondary | ICD-10-CM

## 2019-07-29 DIAGNOSIS — M545 Low back pain: Secondary | ICD-10-CM | POA: Diagnosis present

## 2019-07-29 DIAGNOSIS — Z79899 Other long term (current) drug therapy: Secondary | ICD-10-CM | POA: Insufficient documentation

## 2019-07-29 DIAGNOSIS — I10 Essential (primary) hypertension: Secondary | ICD-10-CM | POA: Diagnosis not present

## 2019-07-29 MED ORDER — HYDROCODONE-ACETAMINOPHEN 5-325 MG PO TABS: 1.0000 | ORAL_TABLET | ORAL | 0 refills | Status: DC | PRN

## 2019-07-29 MED ORDER — ORPHENADRINE CITRATE 30 MG/ML IJ SOLN
60.0000 mg | Freq: Two times a day (BID) | INTRAMUSCULAR | Status: DC
  Administered 2019-07-29: 60 mg via INTRAMUSCULAR
  Filled 2019-07-29: qty 2

## 2019-07-29 MED ORDER — KETOROLAC TROMETHAMINE 30 MG/ML IJ SOLN
30.0000 mg | Freq: Once | INTRAMUSCULAR | Status: AC
  Administered 2019-07-29: 30 mg via INTRAMUSCULAR
  Filled 2019-07-29: qty 1

## 2019-07-29 NOTE — ED Provider Notes (Signed)
[place] EMERGENCY DEPARTMENT Provider Note   CSN: [id number] Arrival date & time: 07/29/19  1404     History   Chief Complaint Chief Complaint  Patient presents with  . Back Pain    HPI Melanie Bowen is a 58 y.o. female with a history of lumbar spondylosis and degenerative disc disease presents to the emergency department for evaluation of acute on chronic low back pain.  She has a history of chronic low back pain, states she is had a flareup over the last few days with pain in the left and right side of her lower back rating to the right leg.  Numbness tingling radicular symptoms right leg is intermittent worse with standing and walking.  She is able to ambulate but with increased pain.  No assist devices.  She is not any medication for pain.  No weakness loss of bowel or bladder symptoms.  No abdominal pain, fevers, chest pain or shortness of breath.  She does have a mild cough which she states at baseline, states this is from normal smoker's cough.  Cough is nonproductive.  She states she was recently around someone who she found out was positive for Covid.  Patient states she was with a friend of hers 3 days ago, found out today that he was positive for Covid.  Patient denies any fevers, chills, body aches, loss of taste or smell.  She is requesting Covid test.     HPI  Past Medical History:  Diagnosis Date  . Acute GI hemorrhage 12/2015   required transfusion  . Allergic rhinitis   . Arthritis    DDD  . Asthma   . Blepharitis   . Bronchitis   . Cigarette smoker   . Claustrophobia   . COPD (chronic obstructive pulmonary disease) ([place])   . Cough   . DDD (degenerative disc disease), lumbar 04/10/2014  . Depression   . Diverticulosis   . Ectopic pregnancy   . Eye irritation   . Genital herpes   . GERD (gastroesophageal reflux disease)   . Headache   . HIV (human immunodeficiency virus infection) ([place])   . Hypertension   . Paresthesia of  hand, bilateral   . Shortness of breath dyspnea    at times due to asthma    Patient Active Problem List   Diagnosis Date Noted  . Diverticulitis large intestine 01/17/2019  . Violation of controlled substance agreement 12/05/2017  . Positive urine drug screen (+cocaine) 12/05/2017  . Cervicalgia 12/05/2017  . Drug-seeking behavior 12/05/2017  . Community acquired pneumonia 11/28/2016  . Pneumonia 11/28/2016  . Allergic rhinitis 05/27/2016  . Tobacco abuse counseling 02/11/2016  . Symptomatic anemia   . Lower GI bleed   . Anemia 01/16/2016  . GI bleed 01/10/2016  . GERD (gastroesophageal reflux disease) 01/10/2016  . HTN (hypertension) 01/10/2016  . Depression 01/10/2016  . HIV (human immunodeficiency virus infection) ([place]) 01/10/2016  . Lumbar pseudoarthrosis 10/31/2015  . Chronic cough   . Cough 06/19/2015  . DDD (degenerative disc disease), lumbar 12/31/2014  . Degenerative disc disease, lumbar 12/31/2014  . Asthma, chronic 11/12/2014    Past Surgical History:  Procedure Laterality Date  . BACK SURGERY  12/2014   lumbar lam. Dr. Deri Fuelling  . BACK SURGERY  12/25/2012   Removal lumbosubarachnoid shunt system, L5-S1 TF ESI, Dr. Virl Axe Minchew  . back surgery may 2016     lumbar   . BUNIONECTOMY Bilateral    feet  .  COLONOSCOPY  12/06/2014   rescheduled from 11/01/2014 due to poor prep  . DIAGNOSTIC LAPAROSCOPY    . DISTAL FEMUR OSTEOTOMY Right    Dr. Earnestine Leys  . ESOPHAGOGASTRODUODENOSCOPY Left 01/11/2016   Procedure: ESOPHAGOGASTRODUODENOSCOPY (EGD);  Surgeon: Hulen Luster, MD;  Location: [facility] ENDOSCOPY;  Service: Endoscopy;  Laterality: Left;  . Finger fracture Right    5th finger  . FRACTURE SURGERY Right 01/25/2014   closed reduction & percutaneous pinning of 5th digit  . HAND SURGERY Bilateral    carpal tunnel  . JOINT REPLACEMENT Bilateral    knees  . KNEE SURGERY Bilateral 2003,2007   total Joint  . LAPAROSCOPY FOR ECTOPIC PREGNANCY    .  PERIPHERAL VASCULAR CATHETERIZATION N/A 01/12/2016   Procedure: Visceral Angiography;  Surgeon: Algernon Huxley, MD;  Location: [place] CV LAB;  Service: Cardiovascular;  Laterality: N/A;  . PERIPHERAL VASCULAR CATHETERIZATION N/A 01/12/2016   Procedure: Visceral Artery Intervention;  Surgeon: Algernon Huxley, MD;  Location: [place] CV LAB;  Service: Cardiovascular;  Laterality: N/A;  . REVISION TOTAL KNEE ARTHROPLASTY Right 12/31/2002   Dr. Marry Guan  . TUBAL LIGATION    . VIDEO BRONCHOSCOPY N/A 06/30/2015   Procedure: VIDEO BRONCHOSCOPY WITHOUT FLUORO;  Surgeon: Vilinda Boehringer, MD;  Location: [facility] ORS;  Service: Cardiopulmonary;  Laterality: N/A;     OB History    Gravida  4   Para      Term      Preterm      AB  1   Living  2     SAB  1   TAB      Ectopic      Multiple      Live Births               Home Medications    Prior to Admission medications   Medication Sig Start Date End Date Taking? Authorizing Provider  albuterol (VENTOLIN HFA) 108 (90 Base) MCG/ACT inhaler Inhale 2 puffs into the lungs every 6 (six) hours as needed for wheezing or shortness of breath. 05/21/19   Laban Emperor, PA-C  amLODipine (NORVASC) 10 MG tablet Take 10 mg by mouth daily.     [provider]  azithromycin (ZITHROMAX Z-PAK) 250 MG tablet Take 2 tablets (500 mg) on  Day 1,  followed by 1 tablet (250 mg) once daily on Days 2 through 5. 05/21/19   Laban Emperor, PA-C  benzonatate (TESSALON PERLES) 100 MG capsule Take 1 capsule (100 mg total) by mouth 3 (three) times daily as needed. 05/21/19 05/20/20  Laban Emperor, PA-C  butalbital-acetaminophen-caffeine (FIORICET) [phone] MG tablet Take 1-2 tablets by mouth every 6 (six) hours as needed for headache. 06/05/19 06/04/20  Lavonia Drafts, MD  DEXILANT 60 MG capsule Take 60 mg by mouth daily. 04/25/19   [provider]  elvitegravir-cobicistat-emtricitabine-tenofovir (GENVOYA) 150-150-200-10 MG TABS tablet Take 1 tablet by  mouth at bedtime.    [provider]  famotidine (PEPCID) 20 MG tablet Take 20 mg by mouth 2 (two) times daily. 05/08/19   [provider]  Fluticasone-Salmeterol (ADVAIR DISKUS) 500-50 MCG/DOSE AEPB Inhale 1 puff into the lungs 2 (two) times daily. 05/27/16   Mungal, Roxanne Mins, MD  gabapentin (NEURONTIN) 300 MG capsule Take 300 mg by mouth 3 (three) times daily. 05/08/19   [provider]  hydrochlorothiazide (HYDRODIURIL) 25 MG tablet Take 25 mg by mouth daily.    [provider]  HYDROcodone-acetaminophen (NORCO) 5-325 MG tablet Take 1 tablet  by mouth every 4 (four) hours as needed for moderate pain. 07/29/19   Duanne Guess, PA-C  ipratropium-albuterol (DUONEB) 0.5-2.5 (3) MG/3ML SOLN Inhale 3 mLs into the lungs 3 (three) times daily.     [provider]  losartan (COZAAR) 50 MG tablet Take 50 mg by mouth daily.    [provider]  montelukast (SINGULAIR) 10 MG tablet Take 10 mg by mouth daily.    [provider]  polyethylene glycol (MIRALAX) 17 g packet Take 17 g by mouth daily as needed for moderate constipation. 05/22/19   Gregor Hams, MD  predniSONE (DELTASONE) 10 MG tablet Take 6 tablets on day 1, take 5 tablets on day 2, take 4 tablets on day 3, take 3 tablets on day 4, take 2 tablets on day 5, take 1 tablet on day 6 05/21/19   Laban Emperor, PA-C  PREVIDENT 5000 SENSITIVE 1.1-5 % PSTE Apply 1 application topically 3 (three) times daily after meals. 01/15/19   [provider]  promethazine (PHENERGAN) 25 MG tablet Take 25 mg by mouth every 6 (six) hours as needed for nausea or vomiting.     [provider]  traMADol (ULTRAM) 50 MG tablet Take 1 tablet (50 mg total) by mouth every 6 (six) hours as needed. 06/05/19 06/04/20  Lavonia Drafts, MD  valACYclovir (VALTREX) 1000 MG tablet Take 1,000 mg by mouth daily. 04/25/19   [provider]    Family History Family History  Problem Relation Age of Onset  .  Breast cancer Sister     Social History Social History   Tobacco Use  . Smoking status: Current Some Day Smoker    Packs/day: 0.25    Years: 38.00    Pack years: 9.50    Types: Cigarettes  . Smokeless tobacco: Never Used  . Tobacco comment: 1 cig daily  Substance Use Topics  . Alcohol use: Yes    Alcohol/week: 5.0 standard drinks    Types: 5 Glasses of wine per week    Comment: occ  . Drug use: Not Currently     Allergies   Ibuprofen, Tylenol [acetaminophen], Gabapentin, Morphine and related, and Zanaflex  [tizanidine]   Review of Systems Review of Systems  Constitutional: Negative for chills, fatigue and fever.  HENT: Negative for sore throat, tinnitus and trouble swallowing.   Respiratory: Negative for chest tightness and shortness of breath.   Cardiovascular: Negative for chest pain.  Gastrointestinal: Negative for diarrhea, nausea and vomiting.  Genitourinary: Negative for flank pain and pelvic pain.  Musculoskeletal: Positive for back pain. Negative for myalgias.  Skin: Negative for rash and wound.  Neurological: Positive for numbness.     Physical Exam Updated Vital Signs BP (!) 143/94 (BP Location: Left Arm)   Pulse 98   Temp 98.8 F (37.1 C) (Oral)   Resp 16   Ht 5\' 6"  (1.676 m)   Wt 122.5 kg   SpO2 98%   BMI 43.58 kg/m   Physical Exam Constitutional:      Appearance: She is well-developed.  HENT:     Head: Normocephalic and atraumatic.     Mouth/Throat:     Pharynx: No oropharyngeal exudate or posterior oropharyngeal erythema.  Eyes:     Conjunctiva/sclera: Conjunctivae normal.  Neck:     Musculoskeletal: Normal range of motion.  Cardiovascular:     Rate and Rhythm: Normal rate.  Pulmonary:     Effort: Pulmonary effort is normal. No respiratory distress.  Musculoskeletal: Normal range of  motion.     Comments: Lumbar Spine: Examination of the lumbar spine reveals no bony abnormality, no edema, and no ecchymosis.  There is no step off.   The patient has decreased range of motion of the lumbar spine with flexion and extension.  The patient has normal lateral bend and rotation.  The patient has no pain with range of motion activities.  The patient has a negative axial load test, and a negative rotational Waddell test.  The patient is non tender along the spinous process.  The patient is tender along the right lower paravertebral muscles, with no muscle spasms.  The patient is non tender along the iliac crest.  The patient is non tender in the sciatic notch.  The patient is non tender along the Sacroiliac joint.  There is no Coccyx joint tenderness.    Bilateral Lower Extremities: Examination of the lower extremities reveals no bony abnormality, no edema, and no ecchymosis.  The patient has full active and passive range of motion of the hips, knees, and ankles.  There is no discomfort with range of motion exercises.  The patient is non tender along the greater trochanter region.  The patient has a negative Bevelyn Buckles' test bilaterally.  There is normal skin warmth.  There is normal capillary refill bilaterally.    Neurologic: The patient has a negative straight leg raise.  The patient has normal muscle strength testing for the quadriceps, calves, ankle dorsiflexion, ankle plantarflexion, and extensor hallicus longus.  The patient has sensation that is intact to light touch.   Skin:    General: Skin is warm.     Findings: No rash.  Neurological:     Mental Status: She is alert and oriented to person, place, and time.  Psychiatric:        Behavior: Behavior normal.        Thought Content: Thought content normal.      ED Treatments / Results  Labs (all labs ordered are listed, but only abnormal results are displayed) Labs Reviewed  NOVEL CORONAVIRUS, NAA (HOSP ORDER, SEND-OUT TO REF LAB; TAT 18-24 HRS)    EKG None  Radiology No results found.  Procedures Procedures (including critical care time)  Medications Ordered in  ED Medications  orphenadrine (NORFLEX) injection 60 mg (has no administration in time range)  ketorolac (TORADOL) 30 MG/ML injection 30 mg (has no administration in time range)     Initial Impression / Assessment and Plan / ED Course  I have reviewed the triage vital signs and the nursing notes.  Pertinent labs & imaging results that were available during my care of the patient were reviewed by me and considered in my medical decision making (see chart for details).        58 year old female with acute on chronic lower back pain.  No neurological deficits or weakness.  She has had several flareups before responding well to Norflex and Toradol.  She is given the same medications today intramuscular route.  She is given Norco to help with severe pain and she will follow-up with orthopedist for ESI injections.  She understands signs symptoms return to ED for.  Patient also with positive Covid exposure with mild cough, nonproductive with no shortness of breath or fevers.  She is requesting Covid test today.  Vital signs are stable.  She understands signs and symptoms return to ED for.  She also understands proper quarantine.  Final Clinical Impressions(s) / ED Diagnoses   Final diagnoses:  Chronic bilateral low  back pain with right-sided sciatica  Close exposure to COVID-19 virus    ED Discharge Orders         Ordered    HYDROcodone-acetaminophen (NORCO) 5-325 MG tablet  Every 4 hours PRN     07/29/19 1449           Melanie Bowen 07/29/19 1510    Carrie Mew, MD 08/03/19 1505

## 2019-07-29 NOTE — Discharge Instructions (Signed)
Please take medication as prescribed.  Follow-up with your orthopedist/physiatrist for epidural steroid injection.  Return to ER for any worsening symptoms or urgent changes in your health.

## 2019-07-29 NOTE — ED Triage Notes (Signed)
FIRST NURSE NOTE:  Pt walked into lobby grabbed wheelchair and rolled it to the registration sign in to the lobby and sat in it.  Registration asked "may I help you" and the patient stated "yeah, if I can ever get up there"  Pt was wheeled to the registration desk by staff member.  Pt c/o chronic back pain. Pt ambulatory without difficulty at this time.

## 2019-07-29 NOTE — ED Notes (Signed)
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
## 2019-07-31 LAB — NOVEL CORONAVIRUS, NAA (HOSP ORDER, SEND-OUT TO REF LAB; TAT 18-24 HRS): SARS-CoV-2, NAA: NOT DETECTED

## 2019-08-11 ENCOUNTER — Encounter: Payer: Self-pay | Admitting: Emergency Medicine

## 2019-08-11 ENCOUNTER — Emergency Department
Admission: EM | Admit: 2019-08-11 | Discharge: 2019-08-11 | Disposition: A | Discharge status: Home / Self Care | Payer: Medicare Other | Attending: Emergency Medicine | Admitting: Emergency Medicine

## 2019-08-11 ENCOUNTER — Other Ambulatory Visit: Payer: Self-pay

## 2019-08-11 DIAGNOSIS — Z96659 Presence of unspecified artificial knee joint: Secondary | ICD-10-CM | POA: Diagnosis not present

## 2019-08-11 DIAGNOSIS — J449 Chronic obstructive pulmonary disease, unspecified: Secondary | ICD-10-CM | POA: Insufficient documentation

## 2019-08-11 DIAGNOSIS — M25561 Pain in right knee: Secondary | ICD-10-CM | POA: Diagnosis present

## 2019-08-11 DIAGNOSIS — F17210 Nicotine dependence, cigarettes, uncomplicated: Secondary | ICD-10-CM | POA: Diagnosis not present

## 2019-08-11 DIAGNOSIS — M545 Low back pain, unspecified: Secondary | ICD-10-CM

## 2019-08-11 DIAGNOSIS — Z79899 Other long term (current) drug therapy: Secondary | ICD-10-CM | POA: Diagnosis not present

## 2019-08-11 DIAGNOSIS — I10 Essential (primary) hypertension: Secondary | ICD-10-CM | POA: Insufficient documentation

## 2019-08-11 DIAGNOSIS — Z21 Asymptomatic human immunodeficiency virus [HIV] infection status: Secondary | ICD-10-CM | POA: Insufficient documentation

## 2019-08-11 MED ORDER — HYDROCODONE-ACETAMINOPHEN 5-325 MG PO TABS: 1.0000 | ORAL_TABLET | Freq: Once | ORAL | Status: DC

## 2019-08-11 MED ORDER — BACLOFEN 5 MG PO TABS: 5.0000 mg | ORAL_TABLET | Freq: Three times a day (TID) | ORAL | 0 refills | Status: DC | PRN

## 2019-08-11 MED ORDER — LIDOCAINE 5 % EX PTCH: 1.0000 | MEDICATED_PATCH | CUTANEOUS | 0 refills | Status: DC

## 2019-08-11 MED ORDER — ORPHENADRINE CITRATE 30 MG/ML IJ SOLN: 60.0000 mg | Freq: Two times a day (BID) | INTRAMUSCULAR | Status: DC

## 2019-08-11 NOTE — ED Notes (Signed)
Pt walking in the room when this RN walked into room.

## 2019-08-11 NOTE — ED Notes (Signed)
Pt not found in room at this time. Pt not in lobby provided made aware.

## 2019-08-11 NOTE — ED Notes (Signed)
Pt ambulatory to front desk to complain about wait time. Pt informed she should be next to be triaged.

## 2019-08-11 NOTE — ED Triage Notes (Signed)
Bilateral knee pain x 2 days. Denies new injury. History of same,

## 2019-08-11 NOTE — ED Provider Notes (Signed)
[facility] Emergency Department Provider Note  ____________________________________________  Time seen: Approximately 6:16 PM  I have reviewed the triage vital signs and the nursing notes.   HISTORY  Chief Complaint Knee Pain    HPI Melanie Bowen is a 58 y.o. female that presents to the emergency department for evaluation of chronic low back pain and chronic bilateral anterior knee pain.  Pain has not changed in character today.  Patient used to receive injections in her back but states that she is not able to receive them until February because of "something to do with Covid." Patient meant to call her primary care this week for a follow-up appointment but never did.  Patient states that she has been seen in this emergency department before and "always gets a shot and a hydrocodone."  She presents today for a shot and hydrocodone. She does not recall trying muscle relaxers in the past for pain.  Patient states that her ride is here, she is in a hurry, and she is unable to wait much longer for any medications.  Patient states that she has been up all day and just wants to go home and go to bed.  No trauma.  No bowel or bladder dysfunction or saddle anesthesias. No hormonal birth control, recent surgeries, or travel. No fever, No abdominal pain, urinary symptoms, numbness, tingling, weakness.   Past Medical History:  Diagnosis Date  . Acute GI hemorrhage 12/2015   required transfusion  . Allergic rhinitis   . Arthritis    DDD  . Asthma   . Blepharitis   . Bronchitis   . Cigarette smoker   . Claustrophobia   . COPD (chronic obstructive pulmonary disease) ([place])   . Cough   . DDD (degenerative disc disease), lumbar 04/10/2014  . Depression   . Diverticulosis   . Ectopic pregnancy   . Eye irritation   . Genital herpes   . GERD (gastroesophageal reflux disease)   . Headache   . HIV (human immunodeficiency virus infection) ([place])   . Hypertension   .  Paresthesia of hand, bilateral   . Shortness of breath dyspnea    at times due to asthma    Patient Active Problem List   Diagnosis Date Noted  . Diverticulitis large intestine 01/17/2019  . Violation of controlled substance agreement 12/05/2017  . Positive urine drug screen (+cocaine) 12/05/2017  . Cervicalgia 12/05/2017  . Drug-seeking behavior 12/05/2017  . Community acquired pneumonia 11/28/2016  . Pneumonia 11/28/2016  . Allergic rhinitis 05/27/2016  . Tobacco abuse counseling 02/11/2016  . Symptomatic anemia   . Lower GI bleed   . Anemia 01/16/2016  . GI bleed 01/10/2016  . GERD (gastroesophageal reflux disease) 01/10/2016  . HTN (hypertension) 01/10/2016  . Depression 01/10/2016  . HIV (human immunodeficiency virus infection) ([place]) 01/10/2016  . Lumbar pseudoarthrosis 10/31/2015  . Chronic cough   . Cough 06/19/2015  . DDD (degenerative disc disease), lumbar 12/31/2014  . Degenerative disc disease, lumbar 12/31/2014  . Asthma, chronic 11/12/2014    Past Surgical History:  Procedure Laterality Date  . BACK SURGERY  12/2014   lumbar lam. Dr. Deri Fuelling  . BACK SURGERY  12/25/2012   Removal lumbosubarachnoid shunt system, L5-S1 TF ESI, Dr. Virl Axe Minchew  . back surgery may 2016     lumbar   . BUNIONECTOMY Bilateral    feet  . COLONOSCOPY  12/06/2014   rescheduled from 11/01/2014 due to poor prep  . DIAGNOSTIC LAPAROSCOPY    .  DISTAL FEMUR OSTEOTOMY Right    Dr. Earnestine Leys  . ESOPHAGOGASTRODUODENOSCOPY Left 01/11/2016   Procedure: ESOPHAGOGASTRODUODENOSCOPY (EGD);  Surgeon: Hulen Luster, MD;  Location: [facility] ENDOSCOPY;  Service: Endoscopy;  Laterality: Left;  . Finger fracture Right    5th finger  . FRACTURE SURGERY Right 01/25/2014   closed reduction & percutaneous pinning of 5th digit  . HAND SURGERY Bilateral    carpal tunnel  . JOINT REPLACEMENT Bilateral    knees  . KNEE SURGERY Bilateral 2003,2007   total Joint  . LAPAROSCOPY FOR ECTOPIC PREGNANCY     . PERIPHERAL VASCULAR CATHETERIZATION N/A 01/12/2016   Procedure: Visceral Angiography;  Surgeon: Algernon Huxley, MD;  Location: [place] [facility];  Service: Cardiovascular;  Laterality: N/A;  . PERIPHERAL VASCULAR CATHETERIZATION N/A 01/12/2016   Procedure: Visceral Artery Intervention;  Surgeon: Algernon Huxley, MD;  Location: [facility];  Service: Cardiovascular;  Laterality: N/A;  . REVISION TOTAL KNEE ARTHROPLASTY Right 12/31/2002   Dr. Marry Guan  . TUBAL LIGATION    . VIDEO BRONCHOSCOPY N/A 06/30/2015   Procedure: VIDEO BRONCHOSCOPY WITHOUT FLUORO;  Surgeon: Vilinda Boehringer, MD;  Location: [facility] ORS;  Service: Cardiopulmonary;  Laterality: N/A;    Prior to Admission medications   Medication Sig Start Date End Date Taking? Authorizing Provider  albuterol (VENTOLIN HFA) 108 (90 Base) MCG/ACT inhaler Inhale 2 puffs into the lungs every 6 (six) hours as needed for wheezing or shortness of breath. 05/21/19   Laban Emperor, PA-C  amLODipine (NORVASC) 10 MG tablet Take 10 mg by mouth daily.     [provider]  azithromycin (ZITHROMAX Z-PAK) 250 MG tablet Take 2 tablets (500 mg) on  Day 1,  followed by 1 tablet (250 mg) once daily on Days 2 through 5. 05/21/19   Laban Emperor, PA-C  Baclofen 5 MG TABS Take 5 mg by mouth 3 (three) times daily as needed. 08/11/19   Laban Emperor, PA-C  benzonatate (TESSALON PERLES) 100 MG capsule Take 1 capsule (100 mg total) by mouth 3 (three) times daily as needed. 05/21/19 05/20/20  Laban Emperor, PA-C  butalbital-acetaminophen-caffeine (FIORICET) [phone] MG tablet Take 1-2 tablets by mouth every 6 (six) hours as needed for headache. 06/05/19 06/04/20  Lavonia Drafts, MD  DEXILANT 60 MG capsule Take 60 mg by mouth daily. 04/25/19   [provider]  elvitegravir-cobicistat-emtricitabine-tenofovir (GENVOYA) 150-150-200-10 MG TABS tablet Take 1 tablet by mouth at bedtime.    [provider]  famotidine (PEPCID) 20 MG tablet Take 20 mg by  mouth 2 (two) times daily. 05/08/19   [provider]  Fluticasone-Salmeterol (ADVAIR DISKUS) 500-50 MCG/DOSE AEPB Inhale 1 puff into the lungs 2 (two) times daily. 05/27/16   Mungal, Roxanne Mins, MD  gabapentin (NEURONTIN) 300 MG capsule Take 300 mg by mouth 3 (three) times daily. 05/08/19   [provider]  hydrochlorothiazide (HYDRODIURIL) 25 MG tablet Take 25 mg by mouth daily.    [provider]  HYDROcodone-acetaminophen (NORCO) 5-325 MG tablet Take 1 tablet by mouth every 4 (four) hours as needed for moderate pain. 07/29/19   Duanne Guess, PA-C  ipratropium-albuterol (DUONEB) 0.5-2.5 (3) MG/3ML SOLN Inhale 3 mLs into the lungs 3 (three) times daily.     [provider]  lidocaine (LIDODERM) 5 % Place 1 patch onto the skin daily. Remove & Discard patch within 12 hours or as directed by MD 08/11/19   Laban Emperor, PA-C  losartan (COZAAR) 50 MG tablet Take 50 mg by mouth  daily.    [provider]  montelukast (SINGULAIR) 10 MG tablet Take 10 mg by mouth daily.    [provider]  polyethylene glycol (MIRALAX) 17 g packet Take 17 g by mouth daily as needed for moderate constipation. 05/22/19   Gregor Hams, MD  predniSONE (DELTASONE) 10 MG tablet Take 6 tablets on day 1, take 5 tablets on day 2, take 4 tablets on day 3, take 3 tablets on day 4, take 2 tablets on day 5, take 1 tablet on day 6 05/21/19   Laban Emperor, PA-C  PREVIDENT 5000 SENSITIVE 1.1-5 % PSTE Apply 1 application topically 3 (three) times daily after meals. 01/15/19   [provider]  promethazine (PHENERGAN) 25 MG tablet Take 25 mg by mouth every 6 (six) hours as needed for nausea or vomiting.     [provider]  traMADol (ULTRAM) 50 MG tablet Take 1 tablet (50 mg total) by mouth every 6 (six) hours as needed. 06/05/19 06/04/20  Lavonia Drafts, MD  valACYclovir (VALTREX) 1000 MG tablet Take 1,000 mg by mouth daily. 04/25/19   [provider]     Allergies Ibuprofen, Tylenol [acetaminophen], Gabapentin, Morphine and related, and Zanaflex  [tizanidine]  Family History  Problem Relation Age of Onset  . Breast cancer Sister     Social History Social History   Tobacco Use  . Smoking status: Current Some Day Smoker    Packs/day: 0.25    Years: 38.00    Pack years: 9.50    Types: Cigarettes  . Smokeless tobacco: Never Used  . Tobacco comment: 1 cig daily  Substance Use Topics  . Alcohol use: Yes    Alcohol/week: 5.0 standard drinks    Types: 5 Glasses of wine per week    Comment: occ  . Drug use: Not Currently     Review of Systems  Constitutional: No fever/chills Respiratory: No SOB. Gastrointestinal: No abdominal pain.  No nausea, no vomiting.  Musculoskeletal: Positive for back and knee pain. Skin: Negative for rash, abrasions, lacerations, ecchymosis. Neurological: Negative for numbness or tingling   ____________________________________________   PHYSICAL EXAM:  VITAL SIGNS: ED Triage Vitals [08/11/19 1705]  Enc Vitals Group     BP (!) 148/70     Pulse Rate 98     Resp 18     Temp 98.8 F (37.1 C)     Temp Source Oral     SpO2 100 %     Weight 270 lb (122.5 kg)     Height 5\' 6"  (1.676 m)     Head Circumference      Peak Flow      Pain Score 9     Pain Loc      Pain Edu?      Excl. in [employer]?      Constitutional: Alert and oriented. Well appearing and in no acute distress. Eyes: Conjunctivae are normal. PERRL. EOMI. Head: Atraumatic. ENT:      Ears:      Nose: No congestion/rhinnorhea.      Mouth/Throat: Mucous membranes are moist.  Neck: No stridor. Cardiovascular: Normal rate, regular rhythm.  Good peripheral circulation. Respiratory: Normal respiratory effort without tachypnea or retractions. Lungs CTAB. Good air entry to the bases with no decreased or absent breath sounds. Gastrointestinal: Bowel sounds 4 quadrants. Soft and nontender to palpation. No guarding or rigidity. No  palpable masses. No distention.  Musculoskeletal: Full range of motion to all extremities. No gross deformities appreciated.  Full  range of motion of bilateral knees.  Strength equal in lower extremities bilaterally.  Normal gait without difficulty with cane. Neurologic:  Normal speech and language. No gross focal neurologic deficits are appreciated.  Skin:  Skin is warm, dry and intact. No rash noted. Psychiatric: Mood and affect are normal. Speech and behavior are normal. Patient exhibits appropriate insight and judgement.   ____________________________________________   LABS (all labs ordered are listed, but only abnormal results are displayed)  Labs Reviewed - No data to display ____________________________________________  EKG   ____________________________________________  RADIOLOGY   No results found.  ____________________________________________    PROCEDURES  Procedure(s) performed:    Procedures    Medications  orphenadrine (NORFLEX) injection 60 mg (has no administration in time range)  HYDROcodone-acetaminophen (NORCO/VICODIN) 5-325 MG per tablet 1 tablet (has no administration in time range)     ____________________________________________   INITIAL IMPRESSION / ASSESSMENT AND PLAN / ED COURSE  Pertinent labs & imaging results that were available during my care of the patient were reviewed by me and considered in my medical decision making (see chart for details).  Review of the [place] CSRS was performed in accordance of the [place] prior to dispensing any controlled drugs.   Patient presented to emergency department for evaluation of chronic back and bilateral knee pain.  Vital signs and exam are reassuring.  Patient denies any change in character of pain today.  Pain is consistent with her chronic pain.  Patient presents only requesting "a shot and a hydrocodone." IM Norflex and oral hydrocodone was ordered for pain.  Patient was not given IM Toradol due  to history of GI bleed. Patient has a history of drug seeking behavior, and upon review of the [place] CSRS has received multiple narcotic prescriptions from multiple different providers  Recently and will therefore not be given any narcotic prescriptions today.  Patient eloped prior to receiving any pain medications or prescriptions.  Patient will be discharged home with prescriptions for baclofen and Lidoderm. Patient is to follow up with primary care and orthopedics as directed. Patient is given ED precautions to return to the ED for any worsening or new symptoms.   Melanie Bowen was evaluated in Emergency Department on 08/11/2019 for the symptoms described in the history of present illness. She was evaluated in the context of the global COVID-19 pandemic, which necessitated consideration that the patient might be at risk for infection with the SARS-CoV-2 virus that causes COVID-19. Institutional protocols and algorithms that pertain to the evaluation of patients at risk for COVID-19 are in a state of rapid change based on information released by regulatory bodies including the CDC and federal and state organizations. These policies and algorithms were followed during the patient's care in the ED.  ____________________________________________  FINAL CLINICAL IMPRESSION(S) / ED DIAGNOSES  Final diagnoses:  Acute pain of right knee  Acute bilateral low back pain without sciatica      NEW MEDICATIONS STARTED DURING THIS VISIT:  ED Discharge Orders         Ordered    Baclofen 5 MG TABS  3 times daily PRN     08/11/19 1811    lidocaine (LIDODERM) 5 %  Every 24 hours     08/11/19 1811              This chart was dictated using voice recognition software/Dragon. Despite best efforts to proofread, errors can occur which can change the meaning. Any change was purely unintentional.    Earleen Newport,  Genevie Cheshire 08/11/19 2112    Nance Pear, MD 08/11/19 2139

## 2019-08-11 NOTE — ED Notes (Signed)
Pt walked to nursing station at this time with a steady gait using a cane at this time.

## 2019-08-11 NOTE — ED Notes (Signed)
Pt walking through hall with cane, no apparent distress. Pt complaining that she is waiting too long and is ready to go home. Pt's knees elevated once in bed and ice pack applied. Pt informed that EDP will be in as soon as possible.

## 2019-08-26 ENCOUNTER — Other Ambulatory Visit: Payer: Self-pay

## 2019-08-26 ENCOUNTER — Emergency Department
Admission: EM | Admit: 2019-08-26 | Discharge: 2019-08-26 | Disposition: A | Discharge status: Home / Self Care | Payer: Medicare Other | Attending: Emergency Medicine | Admitting: Emergency Medicine

## 2019-08-26 ENCOUNTER — Encounter: Payer: Self-pay | Admitting: Emergency Medicine

## 2019-08-26 DIAGNOSIS — R42 Dizziness and giddiness: Secondary | ICD-10-CM | POA: Diagnosis present

## 2019-08-26 DIAGNOSIS — Z5321 Procedure and treatment not carried out due to patient leaving prior to being seen by health care provider: Secondary | ICD-10-CM | POA: Diagnosis not present

## 2019-08-26 LAB — CBC
HCT: 37.5 % (ref 36.0–46.0)
Hemoglobin: 12.6 g/dL (ref 12.0–15.0)
MCH: 29.8 pg (ref 26.0–34.0)
MCHC: 33.6 g/dL (ref 30.0–36.0)
MCV: 88.7 fL (ref 80.0–100.0)
Platelets: 462 10*3/uL — ABNORMAL HIGH (ref 150–400)
RBC: 4.23 MIL/uL (ref 3.87–5.11)
RDW: 14.6 % (ref 11.5–15.5)
WBC: 7 10*3/uL (ref 4.0–10.5)
nRBC: 0 % (ref 0.0–0.2)

## 2019-08-26 LAB — BASIC METABOLIC PANEL
Anion gap: 8 (ref 5–15)
BUN: 14 mg/dL (ref 6–20)
CO2: 24 mmol/L (ref 22–32)
Calcium: 8.6 mg/dL — ABNORMAL LOW (ref 8.9–10.3)
Chloride: 107 mmol/L (ref 98–111)
Creatinine, Ser: 0.84 mg/dL (ref 0.44–1.00)
GFR calc Af Amer: >60 mL/min (ref >60)
GFR calc non Af Amer: >60 mL/min (ref >60)
Glucose, Bld: 129 mg/dL — ABNORMAL HIGH (ref 70–99)
Potassium: 3.9 mmol/L (ref 3.5–5.1)
Sodium: 139 mmol/L (ref 135–145)

## 2019-08-26 MED ORDER — SODIUM CHLORIDE 0.9% FLUSH: 3.0000 mL | Freq: Once | INTRAVENOUS | Status: DC

## 2019-08-26 NOTE — ED Triage Notes (Addendum)
Pt via POV from home. Pt states that she has been having intermittent dizzy for the past 5 days. Pt states that she began to be nauseous today too. "I feel like the room is spinning." Pt also c/o back pain but has a hx of back surgery.   Pt A&Ox4 and NAD at this time. Pt speaking in complete sentences.

## 2019-08-26 NOTE — ED Notes (Signed)
Patient to stat desk in no acute distress asking about wait time. Patient given update on wait time. Patient verbalizes understanding.  

## 2019-08-26 NOTE — ED Notes (Signed)
Patient to stat desk asking about wait time. Patient given update on wait time. Patient verbalizes understanding.  

## 2019-08-26 NOTE — ED Triage Notes (Signed)
First Nurse Note:  C/O intermittent dizziness x 5 days.  Also c/o back pain.   Patient is AAOx3.  Skin warm and dry. NAD.  MAE equally  And strong.

## 2019-08-26 NOTE — ED Notes (Signed)
Patient to stat desk in no acute distress asking wait time. Patient given update on wait time. Patient verbalizes understanding.

## 2019-09-15 ENCOUNTER — Emergency Department
Admission: EM | Admit: 2019-09-15 | Discharge: 2019-09-15 | Disposition: A | Discharge status: Home / Self Care | Payer: Medicare Other | Attending: Emergency Medicine | Admitting: Emergency Medicine

## 2019-09-15 ENCOUNTER — Other Ambulatory Visit: Payer: Self-pay

## 2019-09-15 DIAGNOSIS — Z96652 Presence of left artificial knee joint: Secondary | ICD-10-CM | POA: Insufficient documentation

## 2019-09-15 DIAGNOSIS — M5442 Lumbago with sciatica, left side: Secondary | ICD-10-CM | POA: Insufficient documentation

## 2019-09-15 DIAGNOSIS — Z96651 Presence of right artificial knee joint: Secondary | ICD-10-CM | POA: Diagnosis not present

## 2019-09-15 DIAGNOSIS — M5441 Lumbago with sciatica, right side: Secondary | ICD-10-CM | POA: Diagnosis not present

## 2019-09-15 DIAGNOSIS — B20 Human immunodeficiency virus [HIV] disease: Secondary | ICD-10-CM | POA: Insufficient documentation

## 2019-09-15 DIAGNOSIS — F17210 Nicotine dependence, cigarettes, uncomplicated: Secondary | ICD-10-CM | POA: Diagnosis not present

## 2019-09-15 DIAGNOSIS — J449 Chronic obstructive pulmonary disease, unspecified: Secondary | ICD-10-CM | POA: Diagnosis not present

## 2019-09-15 DIAGNOSIS — Z79899 Other long term (current) drug therapy: Secondary | ICD-10-CM | POA: Insufficient documentation

## 2019-09-15 DIAGNOSIS — M545 Low back pain: Secondary | ICD-10-CM | POA: Diagnosis present

## 2019-09-15 DIAGNOSIS — G8929 Other chronic pain: Secondary | ICD-10-CM | POA: Insufficient documentation

## 2019-09-15 MED ORDER — PREDNISONE 10 MG PO TABS: 10.0000 mg | ORAL_TABLET | Freq: Every day | ORAL | 0 refills | Status: DC

## 2019-09-15 MED ORDER — ORPHENADRINE CITRATE 30 MG/ML IJ SOLN
60.0000 mg | Freq: Once | INTRAMUSCULAR | Status: AC
  Administered 2019-09-15: 60 mg via INTRAMUSCULAR
  Filled 2019-09-15: qty 2

## 2019-09-15 MED ORDER — HYDROCODONE-ACETAMINOPHEN 5-325 MG PO TABS
1.0000 | ORAL_TABLET | Freq: Once | ORAL | Status: AC
  Administered 2019-09-15: 1 via ORAL
  Filled 2019-09-15: qty 1

## 2019-09-15 MED ORDER — DEXAMETHASONE SODIUM PHOSPHATE 10 MG/ML IJ SOLN
10.0000 mg | Freq: Once | INTRAMUSCULAR | Status: AC
  Administered 2019-09-15: 10 mg via INTRAMUSCULAR
  Filled 2019-09-15: qty 1

## 2019-09-15 MED ORDER — METHOCARBAMOL 500 MG PO TABS: 500.0000 mg | ORAL_TABLET | Freq: Four times a day (QID) | ORAL | 0 refills | Status: DC

## 2019-09-15 MED ORDER — LIDOCAINE 5 % EX PTCH: 1.0000 | MEDICATED_PATCH | CUTANEOUS | 0 refills | Status: AC

## 2019-09-15 NOTE — ED Triage Notes (Signed)
Pt states she has a hx of chronic lower back pain and is suppose to get an injection in her back but is having increased pain and wanting the same we had given her in the past to ease her pain,

## 2019-09-15 NOTE — ED Notes (Addendum)
This RN was in the middle of discharging patient when this RN was pulled to another room for a critical patient. Advised pt that I would be back to have her sign and show her how to exit the building. Pt states "I got to get out of here". Paperwork given to patient when I was previously in the room but patient did not provide a signature at discharge. Pt not in room when I went back to continue discharge teaching and collect signature

## 2019-09-15 NOTE — ED Provider Notes (Signed)
[facility] Emergency Department Provider Note  ____________________________________________  Time seen: Approximately 4:11 PM  I have reviewed the triage vital signs and the nursing notes.   HISTORY  Chief Complaint Back Pain    HPI Melanie Bowen is a 59 y.o. female who presents the emergency department complaining of lower back pain with radicular symptoms down bilateral lower extremities.  Patient has a history of 2 lumbar surgeries, apparently lumbar fusions.  Patient has a history of degenerative disc disease with sciatica.  She is being followed by neurosurgery, orthopedics, pain management.  Patient is called for repeat epidural injection, scheduled for same in February.  Patient is having increased symptoms presents the emergency department for pain relief.  Patient states that they typically give her "2 shots and Vicodin" and she states that this is effective.  Patient denies any fevers or chills, abdominal pain, flank pain, dysuria, polyuria, hematuria.  She does have radicular symptoms bilaterally, this is chronic in nature.  No saddle anesthesia, bowel or bladder dysfunction.  Medical history as described below.  Patient is having increased chronic pain, but denies any complaints with other medical history.         Past Medical History:  Diagnosis Date  . Acute GI hemorrhage 12/2015   required transfusion  . Allergic rhinitis   . Arthritis    DDD  . Asthma   . Blepharitis   . Bronchitis   . Cigarette smoker   . Claustrophobia   . COPD (chronic obstructive pulmonary disease) ([place])   . Cough   . DDD (degenerative disc disease), lumbar 04/10/2014  . Depression   . Diverticulosis   . Ectopic pregnancy   . Eye irritation   . Genital herpes   . GERD (gastroesophageal reflux disease)   . Headache   . HIV (human immunodeficiency virus infection) ([place])   . Hypertension   . Paresthesia of hand, bilateral   . Shortness of breath dyspnea     at times due to asthma    Patient Active Problem List   Diagnosis Date Noted  . Diverticulitis large intestine 01/17/2019  . Violation of controlled substance agreement 12/05/2017  . Positive urine drug screen (+cocaine) 12/05/2017  . Cervicalgia 12/05/2017  . Drug-seeking behavior 12/05/2017  . Community acquired pneumonia 11/28/2016  . Pneumonia 11/28/2016  . Allergic rhinitis 05/27/2016  . Tobacco abuse counseling 02/11/2016  . Symptomatic anemia   . Lower GI bleed   . Anemia 01/16/2016  . GI bleed 01/10/2016  . GERD (gastroesophageal reflux disease) 01/10/2016  . HTN (hypertension) 01/10/2016  . Depression 01/10/2016  . HIV (human immunodeficiency virus infection) ([place]) 01/10/2016  . Lumbar pseudoarthrosis 10/31/2015  . Chronic cough   . Cough 06/19/2015  . DDD (degenerative disc disease), lumbar 12/31/2014  . Degenerative disc disease, lumbar 12/31/2014  . Asthma, chronic 11/12/2014    Past Surgical History:  Procedure Laterality Date  . BACK SURGERY  12/2014   lumbar lam. Dr. Deri Fuelling  . BACK SURGERY  12/25/2012   Removal lumbosubarachnoid shunt system, L5-S1 TF ESI, Dr. Virl Axe Minchew  . back surgery may 2016     lumbar   . BUNIONECTOMY Bilateral    feet  . COLONOSCOPY  12/06/2014   rescheduled from 11/01/2014 due to poor prep  . DIAGNOSTIC LAPAROSCOPY    . DISTAL FEMUR OSTEOTOMY Right    Dr. Earnestine Leys  . ESOPHAGOGASTRODUODENOSCOPY Left 01/11/2016   Procedure: ESOPHAGOGASTRODUODENOSCOPY (EGD);  Surgeon: Hulen Luster, MD;  Location:  [facility];  Service: Endoscopy;  Laterality: Left;  . Finger fracture Right    5th finger  . FRACTURE SURGERY Right 01/25/2014   closed reduction & percutaneous pinning of 5th digit  . HAND SURGERY Bilateral    carpal tunnel  . JOINT REPLACEMENT Bilateral    knees  . KNEE SURGERY Bilateral 2003,2007   total Joint  . LAPAROSCOPY FOR ECTOPIC PREGNANCY    . PERIPHERAL VASCULAR CATHETERIZATION N/A 01/12/2016    Procedure: Visceral Angiography;  Surgeon: Algernon Huxley, MD;  Location: [facility];  Service: Cardiovascular;  Laterality: N/A;  . PERIPHERAL VASCULAR CATHETERIZATION N/A 01/12/2016   Procedure: Visceral Artery Intervention;  Surgeon: Algernon Huxley, MD;  Location: [facility];  Service: Cardiovascular;  Laterality: N/A;  . REVISION TOTAL KNEE ARTHROPLASTY Right 12/31/2002   Dr. Marry Guan  . TUBAL LIGATION    . VIDEO BRONCHOSCOPY N/A 06/30/2015   Procedure: VIDEO BRONCHOSCOPY WITHOUT FLUORO;  Surgeon: Vilinda Boehringer, MD;  Location: [facility];  Service: Cardiopulmonary;  Laterality: N/A;    Prior to Admission medications   Medication Sig Start Date End Date Taking? Authorizing Provider  albuterol (VENTOLIN HFA) 108 (90 Base) MCG/ACT inhaler Inhale 2 puffs into the lungs every 6 (six) hours as needed for wheezing or shortness of breath. 05/21/19   Laban Emperor, PA-C  amLODipine (NORVASC) 10 MG tablet Take 10 mg by mouth daily.     [provider]  azithromycin (ZITHROMAX Z-PAK) 250 MG tablet Take 2 tablets (500 mg) on  Day 1,  followed by 1 tablet (250 mg) once daily on Days 2 through 5. 05/21/19   Laban Emperor, PA-C  Baclofen 5 MG TABS Take 5 mg by mouth 3 (three) times daily as needed. 08/11/19   Laban Emperor, PA-C  benzonatate (TESSALON PERLES) 100 MG capsule Take 1 capsule (100 mg total) by mouth 3 (three) times daily as needed. 05/21/19 05/20/20  Laban Emperor, PA-C  butalbital-acetaminophen-caffeine (FIORICET) [phone] MG tablet Take 1-2 tablets by mouth every 6 (six) hours as needed for headache. 06/05/19 06/04/20  Lavonia Drafts, MD  DEXILANT 60 MG capsule Take 60 mg by mouth daily. 04/25/19   [provider]  elvitegravir-cobicistat-emtricitabine-tenofovir (GENVOYA) 150-150-200-10 MG TABS tablet Take 1 tablet by mouth at bedtime.    [provider]  famotidine (PEPCID) 20 MG tablet Take 20 mg by mouth 2 (two) times daily. 05/08/19   [provider]  Fluticasone-Salmeterol (ADVAIR DISKUS) 500-50 MCG/DOSE AEPB Inhale 1 puff into the lungs 2 (two) times daily. 05/27/16   Mungal, Roxanne Mins, MD  gabapentin (NEURONTIN) 300 MG capsule Take 300 mg by mouth 3 (three) times daily. 05/08/19   [provider]  hydrochlorothiazide (HYDRODIURIL) 25 MG tablet Take 25 mg by mouth daily.    [provider]  HYDROcodone-acetaminophen (NORCO) 5-325 MG tablet Take 1 tablet by mouth every 4 (four) hours as needed for moderate pain. 07/29/19   Duanne Guess, PA-C  ipratropium-albuterol (DUONEB) 0.5-2.5 (3) MG/3ML SOLN Inhale 3 mLs into the lungs 3 (three) times daily.     [provider]  lidocaine (LIDODERM) 5 % Place 1 patch onto the skin daily for 20 days. Remove & Discard patch within 12 hours or as directed by MD 09/15/19 10/05/19  Remmy Riffe, Charline Bills, PA-C  losartan (COZAAR) 50 MG tablet Take 50 mg by mouth daily.    [provider]  methocarbamol (ROBAXIN) 500 MG tablet Take 1 tablet (500 mg total) by mouth 4 (four) times  daily. 09/15/19   Giles Currie, Charline Bills, PA-C  montelukast (SINGULAIR) 10 MG tablet Take 10 mg by mouth daily.    [provider]  polyethylene glycol (MIRALAX) 17 g packet Take 17 g by mouth daily as needed for moderate constipation. 05/22/19   Gregor Hams, MD  predniSONE (DELTASONE) 10 MG tablet Take 1 tablet (10 mg total) by mouth daily. 09/15/19   Minahil Quinlivan, Charline Bills, PA-C  PREVIDENT 5000 SENSITIVE 1.1-5 % PSTE Apply 1 application topically 3 (three) times daily after meals. 01/15/19   [provider]  promethazine (PHENERGAN) 25 MG tablet Take 25 mg by mouth every 6 (six) hours as needed for nausea or vomiting.     [provider]  traMADol (ULTRAM) 50 MG tablet Take 1 tablet (50 mg total) by mouth every 6 (six) hours as needed. 06/05/19 06/04/20  Lavonia Drafts, MD  valACYclovir (VALTREX) 1000 MG tablet Take 1,000 mg by mouth daily. 04/25/19   [provider]     Allergies Ibuprofen, Tylenol [acetaminophen], Gabapentin, Morphine and related, and Zanaflex  [tizanidine]  Family History  Problem Relation Age of Onset  . Breast cancer Sister     Social History Social History   Tobacco Use  . Smoking status: Current Some Day Smoker    Packs/day: 0.25    Years: 38.00    Pack years: 9.50    Types: Cigarettes  . Smokeless tobacco: Never Used  . Tobacco comment: 1 cig daily  Substance Use Topics  . Alcohol use: Yes    Alcohol/week: 5.0 standard drinks    Types: 5 Glasses of wine per week    Comment: occ  . Drug use: Not Currently     Review of Systems  Constitutional: No fever/chills Eyes: No visual changes. No discharge ENT: No upper respiratory complaints. Cardiovascular: no chest pain. Respiratory: no cough. No SOB. Gastrointestinal: No abdominal pain.  No nausea, no vomiting.  No diarrhea.  No constipation. Genitourinary: Negative for dysuria. No hematuria Musculoskeletal: Chronic low back pain Skin: Negative for rash, abrasions, lacerations, ecchymosis. Neurological: Negative for headaches, focal weakness or numbness. 10-point ROS otherwise negative.  ____________________________________________   PHYSICAL EXAM:  VITAL SIGNS: ED Triage Vitals  Enc Vitals Group     BP 09/15/19 1412 (!) 154/74     Pulse Rate 09/15/19 1412 92     Resp 09/15/19 1412 16     Temp 09/15/19 1412 98.7 F (37.1 C)     Temp Source 09/15/19 1412 Oral     SpO2 09/15/19 1412 100 %     Weight 09/15/19 1413 270 lb (122.5 kg)     Height 09/15/19 1413 5\' 6"  (1.676 m)     Head Circumference --      Peak Flow --      Pain Score 09/15/19 1413 9     Pain Loc --      Pain Edu? --      Excl. in [place]? --      Constitutional: Alert and oriented. Well appearing and in no acute distress. Eyes: Conjunctivae are normal. PERRL. EOMI. Head: Atraumatic. ENT:      Ears:       Nose: No congestion/rhinnorhea.      Mouth/Throat: Mucous membranes are moist.   Neck: No stridor.   Cardiovascular: Normal rate, regular rhythm. Normal S1 and S2.  Good peripheral circulation. Respiratory: Normal respiratory effort without tachypnea or retractions. Lungs CTAB. Good air entry to the bases with no decreased or absent breath sounds.  Gastrointestinal: Bowel sounds 4 quadrants. Soft and nontender to palpation. No guarding or rigidity. No palpable masses. No distention. No CVA tenderness. Musculoskeletal: Full range of motion to all extremities. No gross deformities appreciated.  Visualization of the lumbar spine reveals no visible abnormality.  Good range of motion.  Reported tenderness throughout the lumbar spine region.  No palpable abnormalities.  Sensation and capillary refill intact bilateral lower extremities. Neurologic:  Normal speech and language. No gross focal neurologic deficits are appreciated.  Skin:  Skin is warm, dry and intact. No rash noted. Psychiatric: Mood and affect are normal. Speech and behavior are normal. Patient exhibits appropriate insight and judgement.   ____________________________________________   LABS (all labs ordered are listed, but only abnormal results are displayed)  Labs Reviewed - No data to display ____________________________________________  EKG   ____________________________________________  RADIOLOGY   No results found.  ____________________________________________    PROCEDURES  Procedure(s) performed:    Procedures    Medications  orphenadrine (NORFLEX) injection 60 mg (has no administration in time range)  dexamethasone (DECADRON) injection 10 mg (has no administration in time range)  HYDROcodone-acetaminophen (NORCO/VICODIN) 5-325 MG per tablet 1 tablet (has no administration in time range)     ____________________________________________   INITIAL IMPRESSION / ASSESSMENT AND PLAN / ED COURSE  Pertinent labs & imaging results that were available during my care of the patient  were reviewed by me and considered in my medical decision making (see chart for details).  Review of the [place] CSRS was performed in accordance of the [place] prior to dispensing any controlled drugs.           Patient's diagnosis is consistent with chronic low back pain.  Patient presented to emergency department complaining of ongoing back pain.  No recent injuries.  No urinary or GI complaints.  Patient is having bilateral radicular symptoms which is chronic.  No significant changes.  On review of patient's medical records, history it appears that patient has scheduled an appointment for pain management for injections in February.  Patient has failed pain management contract in the past with narcotics.  Since her dismissal from pain management in April 2019, patient has had 20 narcotic prescriptions from 16 different providers.  At this time I will not prescribe any narcotics for at home use.  Patient may have prednisone Dosepak, lidocaine patch, muscle relaxer.  Patient has a history of GI bleed and as such we will not prescribe NSAIDs at this time..  Patient may follow-up with neurosurgery, orthopedics, pain management as needed.  Patient is given ED precautions to return to the ED for any worsening or new symptoms.     ____________________________________________  FINAL CLINICAL IMPRESSION(S) / ED DIAGNOSES  Final diagnoses:  Chronic bilateral low back pain with bilateral sciatica      NEW MEDICATIONS STARTED DURING THIS VISIT:  ED Discharge Orders         Ordered    predniSONE (DELTASONE) 10 MG tablet  Daily    Note to Pharmacy: Take 6 pills x 2 days, 5 pills x 2 days, 4 pills x 2 days, 3 pills x 2 days, 2 pills x 2 days, and 1 pill x 2 days   09/15/19 1630    lidocaine (LIDODERM) 5 %  Every 24 hours     09/15/19 1630    methocarbamol (ROBAXIN) 500 MG tablet  4 times daily     09/15/19 1630  This chart was dictated using voice recognition software/Dragon.  Despite best efforts to proofread, errors can occur which can change the meaning. Any change was purely unintentional.    Darletta Moll, PA-C 09/15/19 1631    Duffy Bruce, MD 09/17/19 1236

## 2019-11-04 ENCOUNTER — Encounter: Payer: Self-pay | Admitting: Emergency Medicine

## 2019-11-04 ENCOUNTER — Emergency Department
Admission: EM | Admit: 2019-11-04 | Discharge: 2019-11-04 | Disposition: A | Discharge status: Home / Self Care | Payer: Medicare Other | Source: Home / Self Care | Attending: Emergency Medicine | Admitting: Emergency Medicine

## 2019-11-04 ENCOUNTER — Other Ambulatory Visit: Payer: Self-pay

## 2019-11-04 ENCOUNTER — Emergency Department: Payer: Medicare Other

## 2019-11-04 DIAGNOSIS — Z79899 Other long term (current) drug therapy: Secondary | ICD-10-CM | POA: Insufficient documentation

## 2019-11-04 DIAGNOSIS — Z21 Asymptomatic human immunodeficiency virus [HIV] infection status: Secondary | ICD-10-CM | POA: Insufficient documentation

## 2019-11-04 DIAGNOSIS — R0602 Shortness of breath: Secondary | ICD-10-CM | POA: Insufficient documentation

## 2019-11-04 DIAGNOSIS — Z20822 Contact with and (suspected) exposure to covid-19: Secondary | ICD-10-CM | POA: Insufficient documentation

## 2019-11-04 DIAGNOSIS — J449 Chronic obstructive pulmonary disease, unspecified: Secondary | ICD-10-CM | POA: Insufficient documentation

## 2019-11-04 DIAGNOSIS — F17210 Nicotine dependence, cigarettes, uncomplicated: Secondary | ICD-10-CM | POA: Insufficient documentation

## 2019-11-04 DIAGNOSIS — J45909 Unspecified asthma, uncomplicated: Secondary | ICD-10-CM | POA: Insufficient documentation

## 2019-11-04 DIAGNOSIS — J189 Pneumonia, unspecified organism: Secondary | ICD-10-CM | POA: Diagnosis not present

## 2019-11-04 DIAGNOSIS — I10 Essential (primary) hypertension: Secondary | ICD-10-CM | POA: Insufficient documentation

## 2019-11-04 LAB — COMPREHENSIVE METABOLIC PANEL WITH GFR
ALT: 13 U/L (ref 0–44)
AST: 19 U/L (ref 15–41)
Albumin: 3.5 g/dL (ref 3.5–5.0)
Alkaline Phosphatase: 99 U/L (ref 38–126)
Anion gap: 13 (ref 5–15)
BUN: 10 mg/dL (ref 6–20)
CO2: 24 mmol/L (ref 22–32)
Calcium: 9.2 mg/dL (ref 8.9–10.3)
Chloride: 101 mmol/L (ref 98–111)
Creatinine, Ser: 1.02 mg/dL — ABNORMAL HIGH (ref 0.44–1.00)
GFR calc Af Amer: >60 mL/min (ref >60)
GFR calc non Af Amer: >60 mL/min (ref >60)
Glucose, Bld: 92 mg/dL (ref 70–99)
Potassium: 3.5 mmol/L (ref 3.5–5.1)
Sodium: 138 mmol/L (ref 135–145)
Total Bilirubin: 0.4 mg/dL (ref 0.3–1.2)
Total Protein: 7.4 g/dL (ref 6.5–8.1)

## 2019-11-04 LAB — CBC WITH DIFFERENTIAL/PLATELET
Abs Immature Granulocytes: 0.01 10*3/uL (ref 0.00–0.07)
Basophils Absolute: 0.1 10*3/uL (ref 0.0–0.1)
Basophils Relative: 1 %
Eosinophils Absolute: 0.7 10*3/uL — ABNORMAL HIGH (ref 0.0–0.5)
Eosinophils Relative: 10 %
HCT: 38 % (ref 36.0–46.0)
Hemoglobin: 12.1 g/dL (ref 12.0–15.0)
Immature Granulocytes: 0 %
Lymphocytes Relative: 29 %
Lymphs Abs: 1.9 10*3/uL (ref 0.7–4.0)
MCH: 27.9 pg (ref 26.0–34.0)
MCHC: 31.8 g/dL (ref 30.0–36.0)
MCV: 87.6 fL (ref 80.0–100.0)
Monocytes Absolute: 0.9 10*3/uL (ref 0.1–1.0)
Monocytes Relative: 14 %
Neutro Abs: 2.9 10*3/uL (ref 1.7–7.7)
Neutrophils Relative %: 46 %
Platelets: 508 10*3/uL — ABNORMAL HIGH (ref 150–400)
RBC: 4.34 MIL/uL (ref 3.87–5.11)
RDW: 15.9 % — ABNORMAL HIGH (ref 11.5–15.5)
WBC: 6.5 10*3/uL (ref 4.0–10.5)
nRBC: 0 % (ref 0.0–0.2)

## 2019-11-04 LAB — PROCALCITONIN: Procalcitonin: <0.1 ng/mL

## 2019-11-04 LAB — BRAIN NATRIURETIC PEPTIDE: B Natriuretic Peptide: 28 pg/mL (ref 0.0–100.0)

## 2019-11-04 LAB — TROPONIN I (HIGH SENSITIVITY): Troponin I (High Sensitivity): 4 ng/L (ref <18)

## 2019-11-04 LAB — SARS CORONAVIRUS 2 (TAT 6-24 HRS): SARS Coronavirus 2: NEGATIVE

## 2019-11-04 MED ORDER — BENZONATATE 100 MG PO CAPS
100.0000 mg | ORAL_CAPSULE | Freq: Once | ORAL | Status: AC
  Administered 2019-11-04: 100 mg via ORAL
  Filled 2019-11-04: qty 1

## 2019-11-04 MED ORDER — GUAIFENESIN 100 MG/5ML PO SOLN
5.0000 mL | Freq: Once | ORAL | Status: AC
  Administered 2019-11-04: 100 mg via ORAL
  Filled 2019-11-04: qty 5

## 2019-11-04 MED ORDER — GUAIFENESIN-CODEINE 100-10 MG/5ML PO SOLN: 5.0000 mL | Freq: Four times a day (QID) | ORAL | 0 refills | Status: DC | PRN

## 2019-11-04 MED ORDER — BENZONATATE 100 MG PO CAPS: 100.0000 mg | ORAL_CAPSULE | Freq: Three times a day (TID) | ORAL | 0 refills | Status: DC | PRN

## 2019-11-04 MED ORDER — ONDANSETRON 4 MG PO TBDP: 4.0000 mg | ORAL_TABLET | Freq: Three times a day (TID) | ORAL | 0 refills | Status: DC | PRN

## 2019-11-04 MED ORDER — IPRATROPIUM-ALBUTEROL 0.5-2.5 (3) MG/3ML IN SOLN
3.0000 mL | Freq: Once | RESPIRATORY_TRACT | Status: AC
  Administered 2019-11-04: 3 mL via RESPIRATORY_TRACT
  Filled 2019-11-04: qty 3

## 2019-11-04 MED ORDER — AZITHROMYCIN 250 MG PO TABS: ORAL_TABLET | ORAL | 0 refills | Status: DC

## 2019-11-04 MED ORDER — PREDNISONE 20 MG PO TABS: 40.0000 mg | ORAL_TABLET | Freq: Every day | ORAL | 0 refills | Status: DC

## 2019-11-04 MED ORDER — GUAIFENESIN 100 MG/5ML PO SOLN: 5.0000 mL | ORAL | 0 refills | Status: DC | PRN

## 2019-11-04 MED ORDER — IOHEXOL 350 MG/ML SOLN
100.0000 mL | Freq: Once | INTRAVENOUS | Status: AC | PRN
  Administered 2019-11-04: 100 mL via INTRAVENOUS

## 2019-11-04 MED ORDER — ONDANSETRON HCL 4 MG/2ML IJ SOLN
4.0000 mg | Freq: Once | INTRAMUSCULAR | Status: AC
  Administered 2019-11-04: 4 mg via INTRAVENOUS
  Filled 2019-11-04: qty 2

## 2019-11-04 MED ORDER — GUAIFENESIN-CODEINE 100-10 MG/5ML PO SOLN: 5.0000 mL | Freq: Four times a day (QID) | ORAL | 0 refills | Status: AC | PRN

## 2019-11-04 MED ORDER — METHYLPREDNISOLONE SODIUM SUCC 125 MG IJ SOLR
125.0000 mg | Freq: Once | INTRAMUSCULAR | Status: AC
  Administered 2019-11-04: 125 mg via INTRAVENOUS
  Filled 2019-11-04: qty 2

## 2019-11-04 NOTE — ED Provider Notes (Signed)
[facility] Emergency Department Provider Note  ____________________________________________   First MD Initiated Contact with Patient 11/04/19 [phone]     (approximate)  I have reviewed the triage vital signs and the nursing notes.   HISTORY  Chief Complaint Chest Pain and Shortness of Breath    HPI Melanie Bowen is a 59 y.o. female with history of HIV with undetectable virus load currently on medications followed by ID, COPD/asthma, smoker who comes in for shortness of breath and chest pain.  Patient states that she has had some shortness of breath for the past month but over the past 2 days is gotten worse.  Patient states that her shortness of breath is moderate, worse with exertion, better at rest.  Slightly improved with her albuterol.  She states that she is now having some chest discomfort in the middle of her chest and a little bit of a cough as well.  States she has a history of bronchitis.          Past Medical History:  Diagnosis Date  . Acute GI hemorrhage 12/2015   required transfusion  . Allergic rhinitis   . Arthritis    DDD  . Asthma   . Blepharitis   . Bronchitis   . Cigarette smoker   . Claustrophobia   . COPD (chronic obstructive pulmonary disease) ([place])   . Cough   . DDD (degenerative disc disease), lumbar 04/10/2014  . Depression   . Diverticulosis   . Ectopic pregnancy   . Eye irritation   . Genital herpes   . GERD (gastroesophageal reflux disease)   . Headache   . HIV (human immunodeficiency virus infection) ([place])   . Hypertension   . Paresthesia of hand, bilateral   . Shortness of breath dyspnea    at times due to asthma    Patient Active Problem List   Diagnosis Date Noted  . Diverticulitis large intestine 01/17/2019  . Violation of controlled substance agreement 12/05/2017  . Positive urine drug screen (+cocaine) 12/05/2017  . Cervicalgia 12/05/2017  . Drug-seeking behavior 12/05/2017  . Community  acquired pneumonia 11/28/2016  . Pneumonia 11/28/2016  . Allergic rhinitis 05/27/2016  . Tobacco abuse counseling 02/11/2016  . Symptomatic anemia   . Lower GI bleed   . Anemia 01/16/2016  . GI bleed 01/10/2016  . GERD (gastroesophageal reflux disease) 01/10/2016  . HTN (hypertension) 01/10/2016  . Depression 01/10/2016  . HIV (human immunodeficiency virus infection) ([place]) 01/10/2016  . Lumbar pseudoarthrosis 10/31/2015  . Chronic cough   . Cough 06/19/2015  . DDD (degenerative disc disease), lumbar 12/31/2014  . Degenerative disc disease, lumbar 12/31/2014  . Asthma, chronic 11/12/2014    Past Surgical History:  Procedure Laterality Date  . BACK SURGERY  12/2014   lumbar lam. Dr. Deri Fuelling  . BACK SURGERY  12/25/2012   Removal lumbosubarachnoid shunt system, L5-S1 TF ESI, Dr. Virl Axe Minchew  . back surgery may 2016     lumbar   . BUNIONECTOMY Bilateral    feet  . COLONOSCOPY  12/06/2014   rescheduled from 11/01/2014 due to poor prep  . DIAGNOSTIC LAPAROSCOPY    . DISTAL FEMUR OSTEOTOMY Right    Dr. Earnestine Leys  . ESOPHAGOGASTRODUODENOSCOPY Left 01/11/2016   Procedure: ESOPHAGOGASTRODUODENOSCOPY (EGD);  Surgeon: Hulen Luster, MD;  Location: [facility] ENDOSCOPY;  Service: Endoscopy;  Laterality: Left;  . Finger fracture Right    5th finger  . FRACTURE SURGERY Right 01/25/2014   closed reduction &  percutaneous pinning of 5th digit  . HAND SURGERY Bilateral    carpal tunnel  . JOINT REPLACEMENT Bilateral    knees  . KNEE SURGERY Bilateral 2003,2007   total Joint  . LAPAROSCOPY FOR ECTOPIC PREGNANCY    . PERIPHERAL VASCULAR CATHETERIZATION N/A 01/12/2016   Procedure: Visceral Angiography;  Surgeon: Algernon Huxley, MD;  Location: [facility];  Service: Cardiovascular;  Laterality: N/A;  . PERIPHERAL VASCULAR CATHETERIZATION N/A 01/12/2016   Procedure: Visceral Artery Intervention;  Surgeon: Algernon Huxley, MD;  Location: [place] [facility];  Service: Cardiovascular;   Laterality: N/A;  . REVISION TOTAL KNEE ARTHROPLASTY Right 12/31/2002   Dr. Marry Guan  . TUBAL LIGATION    . VIDEO BRONCHOSCOPY N/A 06/30/2015   Procedure: VIDEO BRONCHOSCOPY WITHOUT FLUORO;  Surgeon: Vilinda Boehringer, MD;  Location: [facility] ORS;  Service: Cardiopulmonary;  Laterality: N/A;    Prior to Admission medications   Medication Sig Start Date End Date Taking? Authorizing Provider  albuterol (VENTOLIN HFA) 108 (90 Base) MCG/ACT inhaler Inhale 2 puffs into the lungs every 6 (six) hours as needed for wheezing or shortness of breath. 05/21/19   Laban Emperor, PA-C  amLODipine (NORVASC) 10 MG tablet Take 10 mg by mouth daily.     [provider]  azithromycin (ZITHROMAX Z-PAK) 250 MG tablet Take 2 tablets (500 mg) on  Day 1,  followed by 1 tablet (250 mg) once daily on Days 2 through 5. 05/21/19   Laban Emperor, PA-C  Baclofen 5 MG TABS Take 5 mg by mouth 3 (three) times daily as needed. 08/11/19   Laban Emperor, PA-C  benzonatate (TESSALON PERLES) 100 MG capsule Take 1 capsule (100 mg total) by mouth 3 (three) times daily as needed. 05/21/19 05/20/20  Laban Emperor, PA-C  butalbital-acetaminophen-caffeine (FIORICET) [phone] MG tablet Take 1-2 tablets by mouth every 6 (six) hours as needed for headache. 06/05/19 06/04/20  Lavonia Drafts, MD  DEXILANT 60 MG capsule Take 60 mg by mouth daily. 04/25/19   [provider]  elvitegravir-cobicistat-emtricitabine-tenofovir (GENVOYA) 150-150-200-10 MG TABS tablet Take 1 tablet by mouth at bedtime.    [provider]  famotidine (PEPCID) 20 MG tablet Take 20 mg by mouth 2 (two) times daily. 05/08/19   [provider]  Fluticasone-Salmeterol (ADVAIR DISKUS) 500-50 MCG/DOSE AEPB Inhale 1 puff into the lungs 2 (two) times daily. 05/27/16   Mungal, Roxanne Mins, MD  gabapentin (NEURONTIN) 300 MG capsule Take 300 mg by mouth 3 (three) times daily. 05/08/19   [provider]  hydrochlorothiazide (HYDRODIURIL) 25 MG tablet Take 25 mg  by mouth daily.    [provider]  HYDROcodone-acetaminophen (NORCO) 5-325 MG tablet Take 1 tablet by mouth every 4 (four) hours as needed for moderate pain. 07/29/19   Duanne Guess, PA-C  ipratropium-albuterol (DUONEB) 0.5-2.5 (3) MG/3ML SOLN Inhale 3 mLs into the lungs 3 (three) times daily.     [provider]  losartan (COZAAR) 50 MG tablet Take 50 mg by mouth daily.    [provider]  methocarbamol (ROBAXIN) 500 MG tablet Take 1 tablet (500 mg total) by mouth 4 (four) times daily. 09/15/19   Cuthriell, Charline Bills, PA-C  montelukast (SINGULAIR) 10 MG tablet Take 10 mg by mouth daily.    [provider]  polyethylene glycol (MIRALAX) 17 g packet Take 17 g by mouth daily as needed for moderate constipation. 05/22/19   Gregor Hams, MD  predniSONE (DELTASONE) 10 MG tablet Take 1 tablet (10 mg total)  by mouth daily. 09/15/19   Cuthriell, Charline Bills, PA-C  PREVIDENT 5000 SENSITIVE 1.1-5 % PSTE Apply 1 application topically 3 (three) times daily after meals. 01/15/19   [provider]  promethazine (PHENERGAN) 25 MG tablet Take 25 mg by mouth every 6 (six) hours as needed for nausea or vomiting.     [provider]  traMADol (ULTRAM) 50 MG tablet Take 1 tablet (50 mg total) by mouth every 6 (six) hours as needed. 06/05/19 06/04/20  Lavonia Drafts, MD  valACYclovir (VALTREX) 1000 MG tablet Take 1,000 mg by mouth daily. 04/25/19   [provider]    Allergies Ibuprofen, Tylenol [acetaminophen], Gabapentin, Morphine and related, and Zanaflex  [tizanidine]  Family History  Problem Relation Age of Onset  . Breast cancer Sister     Social History Social History   Tobacco Use  . Smoking status: Current Some Day Smoker    Packs/day: 0.25    Years: 38.00    Pack years: 9.50    Types: Cigarettes  . Smokeless tobacco: Never Used  . Tobacco comment: 1 cig daily  Substance Use Topics  . Alcohol use: Yes    Alcohol/week: 5.0  standard drinks    Types: 5 Glasses of wine per week    Comment: occ  . Drug use: Not Currently      Review of Systems Constitutional: No fever/chills Eyes: No visual changes. ENT: No sore throat. Cardiovascular: Positive chest pain Respiratory: Positive for SOB, cough Gastrointestinal: No abdominal pain.  No nausea, no vomiting.  No diarrhea.  No constipation. Genitourinary: Negative for dysuria. Musculoskeletal: Negative for back pain. Skin: Negative for rash. Neurological: Negative for headaches, focal weakness or numbness. All other ROS negative ____________________________________________   PHYSICAL EXAM:  VITAL SIGNS: Blood pressure 131/76, pulse 100, temperature 98 F (36.7 C), temperature source Oral, resp. rate 16, height (S) 5\' 6"  (1.676 m), weight (S) 123 kg, SpO2 98 %.  Constitutional: Alert and oriented. Well appearing and in no acute distress. Eyes: Conjunctivae are normal. EOMI. Head: Atraumatic. Nose: No congestion/rhinnorhea. Mouth/Throat: Mucous membranes are moist.   Neck: No stridor. Trachea Midline. FROM Cardiovascular: Tachycardia, regular rhythm. Grossly normal heart sounds.  Good peripheral circulation.   Respiratory: Clear breath sounds bilaterally.No increased work of breathing Gastrointestinal: Soft and nontender. No distention. No abdominal bruits.  Musculoskeletal: No lower extremity tenderness nor edema.  No joint effusions. Neurologic:  Normal speech and language. No gross focal neurologic deficits are appreciated.  Skin:  Skin is warm, dry and intact. No rash noted. Psychiatric: Mood and affect are normal. Speech and behavior are normal. GU: Deferred   ____________________________________________   LABS (all labs ordered are listed, but only abnormal results are displayed)  Labs Reviewed  CBC WITH DIFFERENTIAL/PLATELET - Abnormal; Notable for the following components:      Result Value   RDW 15.9 (*)    Platelets 508 (*)     Eosinophils Absolute 0.7 (*)    All other components within normal limits  COMPREHENSIVE METABOLIC PANEL - Abnormal; Notable for the following components:   Creatinine, Ser 1.02 (*)    All other components within normal limits  SARS CORONAVIRUS 2 (TAT 6-24 HRS)  PROCALCITONIN  BRAIN NATRIURETIC PEPTIDE  URINALYSIS, ROUTINE W REFLEX MICROSCOPIC  TROPONIN I (HIGH SENSITIVITY)  TROPONIN I (HIGH SENSITIVITY)   ____________________________________________   ED ECG REPORT I, Vanessa [place], the attending physician, personally viewed and interpreted this ECG.  EKG is sinus tachycardia rate of 104, no ST  elevation, no T wave inversions, normal intervals ____________________________________________  RADIOLOGY   Official radiology report(s): CT Angio Chest PE W and/or Wo Contrast  Result Date: 11/04/2019 CLINICAL DATA:  Shortness of breath, chest pain and cough. Negative COVID-19 test. EXAM: CT ANGIOGRAPHY CHEST WITH CONTRAST TECHNIQUE: Multidetector CT imaging of the chest was performed using the standard protocol during bolus administration of intravenous contrast. Multiplanar CT image reconstructions and MIPs were obtained to evaluate the vascular anatomy. CONTRAST:  123mL OMNIPAQUE IOHEXOL 350 MG/ML SOLN COMPARISON:  05/22/2019 FINDINGS: Cardiovascular: Normally opacified pulmonary arteries with no pulmonary arterial filling defects seen. Atheromatous calcifications, including the coronary arteries and aorta. Normal sized heart. Mediastinum/Nodes: No enlarged mediastinal, hilar, or axillary lymph nodes. Thyroid gland, trachea, and esophagus demonstrate no significant findings. Lungs/Pleura: Interval multiple areas of patchy opacity in both lungs in a somewhat peripheral distribution. These are involving both upper lobes, lingula and left lower lobe. This is most pronounced in the right upper lobe. No pleural fluid. Upper Abdomen: No acute abnormality. Musculoskeletal: Thoracic and lower cervical  spine degenerative changes. Bilateral glenohumeral degenerative changes. Review of the MIP images confirms the above findings. IMPRESSION: 1. Interval multiple areas of patchy opacity in both lungs, most pronounced in the right upper lobe. This is compatible with multifocal pneumonia with an appearance suspicious for COVID-19 infection despite the recent negative COVID test. 2. No pulmonary emboli. 3. Calcific coronary artery and aortic atherosclerosis. Aortic Atherosclerosis (ICD10-I70.0). Electronically Signed   By: Claudie Revering M.D.   On: 11/04/2019 09:58    ____________________________________________   PROCEDURES  Procedure(s) performed (including Critical Care):  Procedures   ____________________________________________   INITIAL IMPRESSION / ASSESSMENT AND PLAN / ED COURSE   Melanie Bowen was evaluated in Emergency Department on 11/04/2019 for the symptoms described in the history of present illness. She was evaluated in the context of the global COVID-19 pandemic, which necessitated consideration that the patient might be at risk for infection with the SARS-CoV-2 virus that causes COVID-19. Institutional protocols and algorithms that pertain to the evaluation of patients at risk for COVID-19 are in a state of rapid change based on information released by regulatory bodies including the CDC and federal and state organizations. These policies and algorithms were followed during the patient's care in the ED.     Pt presents with SOB and chest pain.  Could be secondary to COPD given her history of smoking although do not hear significant wheezing.  Given the shortness of breath has been going on for a month I think is reasonable to consider PE especially given the fact that patient is tachycardic.  D-dimer has been elevated on her prior visit so do not think D-dimer will be helpful today.-The CT scan will be helpful to make sure no evidence of PE but also evaluate for pneumonia.  Will get  labs to evaluate for anemia and ACS as well.  Will get Covid testing.   Cardiac markers negative therefore unlikely ACS.  CT scan is concerning for multiple areas of patchy opacities that is concerning for COVID-19 despite her recently negative Covid test.  I discussed this result with patient.  Risk discussed given her COPD and asthma history treatment with azithromycin, steroids.  Patient was able to ambulate with saturations above 94%.  Patient feels comfortable with discharge home.  Patient remain quarantine until her Covid results come back.  Patient stating that the [facility] and guaifenesin does not help.  She is requesting guaifenesin codeine.  Patient tolerated this  previously.  We will give a course.  Discussed with patient not driving or operating machinery and being careful that it is not cause a fall  I discussed the provisional nature of ED diagnosis, the treatment so far, the ongoing plan of care, follow up appointments and return precautions with the patient and any family or support people present. They expressed understanding and agreed with the plan, discharged home.   ______________________________________   FINAL CLINICAL IMPRESSION(S) / ED DIAGNOSES   Final diagnoses:  Suspected COVID-19 virus infection  Chronic obstructive pulmonary disease, unspecified COPD type ([place])     MEDICATIONS GIVEN DURING THIS VISIT:  Medications  methylPREDNISolone sodium succinate (SOLU-MEDROL) 125 mg/2 mL injection 125 mg (125 mg Intravenous Given 11/04/19 0823)  ipratropium-albuterol (DUONEB) 0.5-2.5 (3) MG/3ML nebulizer solution 3 mL (3 mLs Nebulization Given 11/04/19 0824)  ipratropium-albuterol (DUONEB) 0.5-2.5 (3) MG/3ML nebulizer solution 3 mL (3 mLs Nebulization Given 11/04/19 0824)  ipratropium-albuterol (DUONEB) 0.5-2.5 (3) MG/3ML nebulizer solution 3 mL (3 mLs Nebulization Given 11/04/19 0823)  iohexol (OMNIPAQUE) 350 MG/ML injection 100 mL (100 mLs Intravenous Contrast Given  11/04/19 0929)  ondansetron (ZOFRAN) injection 4 mg (4 mg Intravenous Given 11/04/19 1029)  benzonatate (TESSALON) capsule 100 mg (100 mg Oral Given 11/04/19 1029)  guaiFENesin (ROBITUSSIN) 100 MG/5ML solution 100 mg (100 mg Oral Given 11/04/19 1028)     ED Discharge Orders         Ordered    predniSONE (DELTASONE) 20 MG tablet  Daily     11/04/19 1055    azithromycin (ZITHROMAX Z-PAK) 250 MG tablet     11/04/19 1055    ondansetron (ZOFRAN ODT) 4 MG disintegrating tablet  Every 8 hours PRN     11/04/19 1055    benzonatate (TESSALON PERLES) 100 MG capsule  3 times daily PRN,   Status:  Discontinued     11/04/19 1055    guaiFENesin (ROBITUSSIN) 100 MG/5ML SOLN  Every 4 hours PRN,   Status:  Discontinued     11/04/19 1055    guaiFENesin-codeine 100-10 MG/5ML syrup  Every 6 hours PRN     11/04/19 1101           Note:  This document was prepared using Dragon voice recognition software and may include unintentional dictation errors.   Vanessa Hubbard, MD 11/04/19 1102

## 2019-11-04 NOTE — Discharge Instructions (Addendum)
We are concerned that you do have coronavirus.  You should stay quarantine at home until your result comes back.  You should take the steroids and azithromycin to help with the infection in your lungs.  You can take the guaifenesin with codeine to help with cough. DO NOT DRIVE while on this.  You take Zofran to help with any nausea.  You should return to the ER if your shortness of breath gets any worse  IMPRESSION:  1. Interval multiple areas of patchy opacity in both lungs, most  pronounced in the right upper lobe. This is compatible with  multifocal pneumonia with an appearance suspicious for COVID-19  infection despite the recent negative COVID test.  2. No pulmonary emboli.  3. Calcific coronary artery and aortic atherosclerosis.      Person Under Monitoring Name: Melanie Bowen  Location: [place] [address]   Infection Prevention Recommendations for Individuals Confirmed to have, or Being Evaluated for, 2019 Novel Coronavirus (COVID-19) Infection Who Receive Care at Home  Individuals who are confirmed to have, or are being evaluated for, COVID-19 should follow the prevention steps below until a healthcare provider or local or state health department says they can return to normal activities.  Stay home except to get medical care You should restrict activities outside your home, except for getting medical care. Do not go to work, school, or public areas, and do not use public transportation or taxis.  Call ahead before visiting your doctor Before your medical appointment, call the healthcare provider and tell them that you have, or are being evaluated for, COVID-19 infection. This will help the healthcare provider's office take steps to keep other people from getting infected. Ask your healthcare provider to call the local or state health department.  Monitor your symptoms Seek prompt medical attention if your illness is worsening (e.g., difficulty  breathing). Before going to your medical appointment, call the healthcare provider and tell them that you have, or are being evaluated for, COVID-19 infection. Ask your healthcare provider to call the local or state health department.  Wear a facemask You should wear a facemask that covers your nose and mouth when you are in the same room with other people and when you visit a healthcare provider. People who live with or visit you should also wear a facemask while they are in the same room with you.  Separate yourself from other people in your home As much as possible, you should stay in a different room from other people in your home. Also, you should use a separate bathroom, if available.  Avoid sharing household items You should not share dishes, drinking glasses, cups, eating utensils, towels, bedding, or other items with other people in your home. After using these items, you should wash them thoroughly with soap and water.  Cover your coughs and sneezes Cover your mouth and nose with a tissue when you cough or sneeze, or you can cough or sneeze into your sleeve. Throw used tissues in a lined trash can, and immediately wash your hands with soap and water for at least 20 seconds or use an alcohol-based hand rub.  Wash your [facility] your hands often and thoroughly with soap and water for at least 20 seconds. You can use an alcohol-based hand sanitizer if soap and water are not available and if your hands are not visibly dirty. Avoid touching your eyes, nose, and mouth with unwashed hands.   Prevention Steps for Caregivers and Household  Members of Individuals Confirmed to have, or Being Evaluated for, COVID-19 Infection Being Cared for in the Home  If you live with, or provide care at home for, a person confirmed to have, or being evaluated for, COVID-19 infection please follow these guidelines to prevent infection:  Follow healthcare provider's instructions Make sure that you  understand and can help the patient follow any healthcare provider instructions for all care.  Provide for the patient's basic needs You should help the patient with basic needs in the home and provide support for getting groceries, prescriptions, and other personal needs.  Monitor the patient's symptoms If they are getting sicker, call his or her medical provider and tell them that the patient has, or is being evaluated for, COVID-19 infection. This will help the healthcare provider's office take steps to keep other people from getting infected. Ask the healthcare provider to call the local or state health department.  Limit the number of people who have contact with the patient If possible, have only one caregiver for the patient. Other household members should stay in another home or place of residence. If this is not possible, they should stay in another room, or be separated from the patient as much as possible. Use a separate bathroom, if available. Restrict visitors who do not have an essential need to be in the home.  Keep older adults, very young children, and other sick people away from the patient Keep older adults, very young children, and those who have compromised immune systems or chronic health conditions away from the patient. This includes people with chronic heart, lung, or kidney conditions, diabetes, and cancer.  Ensure good ventilation Make sure that shared spaces in the home have good air flow, such as from an air conditioner or an opened window, weather permitting.  Wash your hands often Wash your hands often and thoroughly with soap and water for at least 20 seconds. You can use an alcohol based hand sanitizer if soap and water are not available and if your hands are not visibly dirty. Avoid touching your eyes, nose, and mouth with unwashed hands. Use disposable paper towels to dry your hands. If not available, use dedicated cloth towels and replace them when they  become wet.  Wear a facemask and gloves Wear a disposable facemask at all times in the room and gloves when you touch or have contact with the patient's blood, body fluids, and/or secretions or excretions, such as sweat, saliva, sputum, nasal mucus, vomit, urine, or feces.  Ensure the mask fits over your nose and mouth tightly, and do not touch it during use. Throw out disposable facemasks and gloves after using them. Do not reuse. Wash your hands immediately after removing your facemask and gloves. If your personal clothing becomes contaminated, carefully remove clothing and launder. Wash your hands after handling contaminated clothing. Place all used disposable facemasks, gloves, and other waste in a lined container before disposing them with other household waste. Remove gloves and wash your hands immediately after handling these items.  Do not share dishes, glasses, or other household items with the patient Avoid sharing household items. You should not share dishes, drinking glasses, cups, eating utensils, towels, bedding, or other items with a patient who is confirmed to have, or being evaluated for, COVID-19 infection. After the person uses these items, you should wash them thoroughly with soap and water.  Wash laundry thoroughly Immediately remove and wash clothes or bedding that have blood, body fluids, and/or secretions or excretions,  such as sweat, saliva, sputum, nasal mucus, vomit, urine, or feces, on them. Wear gloves when handling laundry from the patient. Read and follow directions on labels of laundry or clothing items and detergent. In general, wash and dry with the warmest temperatures recommended on the label.  Clean all areas the individual has used often Clean all touchable surfaces, such as counters, tabletops, doorknobs, bathroom fixtures, toilets, phones, keyboards, tablets, and bedside tables, every day. Also, clean any surfaces that may have blood, body fluids, and/or  secretions or excretions on them. Wear gloves when cleaning surfaces the patient has come in contact with. Use a diluted bleach solution (e.g., dilute bleach with 1 part bleach and 10 parts water) or a household disinfectant with a label that says EPA-registered for coronaviruses. To make a bleach solution at home, add 1 tablespoon of bleach to 1 quart (4 cups) of water. For a larger supply, add  cup of bleach to 1 gallon (16 cups) of water. Read labels of cleaning products and follow recommendations provided on product labels. Labels contain instructions for safe and effective use of the cleaning product including precautions you should take when applying the product, such as wearing gloves or eye protection and making sure you have good ventilation during use of the product. Remove gloves and wash hands immediately after cleaning.  Monitor yourself for signs and symptoms of illness Caregivers and household members are considered close contacts, should monitor their health, and will be asked to limit movement outside of the home to the extent possible. Follow the monitoring steps for close contacts listed on the symptom monitoring form.   ? If you have additional questions, contact your local health department or call the epidemiologist on call at [phone] (available 24/7). ? This guidance is subject to change. For the most up-to-date guidance from [facility], please refer to their website: [email or web address]

## 2019-11-04 NOTE — ED Notes (Signed)
Patient is upset for unknown reason, and has verbally stated to RN that patient is ready to go. Patient has also stated that she "wants to know when your going to let her know what is going on" after patient was just brought back from CT.

## 2019-11-04 NOTE — ED Notes (Signed)
Patient has returned from CT

## 2019-11-04 NOTE — ED Notes (Signed)
Patient ambulated in room for a total of 24 feet. Patient O2 saturation remained at and above 94% on room air. Patient stated she did not feel short of breath but the excessive coughing was the issue.

## 2019-11-04 NOTE — ED Triage Notes (Addendum)
Pt arrived via POV, ambulatory from parking lot, pt states for the past week she has been having shortness of breath, chest pain and coughing.  Pt states she had COVID test recently that was negative.  Pt requested wheelchair on arrival into ED.  Pt carrying bag of [employer] and coffee cup.

## 2019-11-06 ENCOUNTER — Encounter: Payer: Self-pay | Admitting: *Deleted

## 2019-11-06 ENCOUNTER — Other Ambulatory Visit: Payer: Self-pay

## 2019-11-06 ENCOUNTER — Inpatient Hospital Stay
Admission: EM | Admit: 2019-11-06 | Discharge: 2019-11-08 | DRG: 194 | Disposition: A | Discharge status: Home / Self Care | Payer: Medicare Other | Attending: Family Medicine | Admitting: Family Medicine

## 2019-11-06 ENCOUNTER — Emergency Department: Payer: Medicare Other

## 2019-11-06 DIAGNOSIS — Z888 Allergy status to other drugs, medicaments and biological substances status: Secondary | ICD-10-CM

## 2019-11-06 DIAGNOSIS — F17210 Nicotine dependence, cigarettes, uncomplicated: Secondary | ICD-10-CM | POA: Diagnosis present

## 2019-11-06 DIAGNOSIS — Z803 Family history of malignant neoplasm of breast: Secondary | ICD-10-CM

## 2019-11-06 DIAGNOSIS — Z96653 Presence of artificial knee joint, bilateral: Secondary | ICD-10-CM | POA: Diagnosis present

## 2019-11-06 DIAGNOSIS — Z20822 Contact with and (suspected) exposure to covid-19: Secondary | ICD-10-CM | POA: Diagnosis present

## 2019-11-06 DIAGNOSIS — N179 Acute kidney failure, unspecified: Secondary | ICD-10-CM

## 2019-11-06 DIAGNOSIS — J188 Other pneumonia, unspecified organism: Secondary | ICD-10-CM

## 2019-11-06 DIAGNOSIS — Z7952 Long term (current) use of systemic steroids: Secondary | ICD-10-CM

## 2019-11-06 DIAGNOSIS — J45909 Unspecified asthma, uncomplicated: Secondary | ICD-10-CM | POA: Diagnosis present

## 2019-11-06 DIAGNOSIS — B20 Human immunodeficiency virus [HIV] disease: Secondary | ICD-10-CM | POA: Diagnosis present

## 2019-11-06 DIAGNOSIS — I10 Essential (primary) hypertension: Secondary | ICD-10-CM | POA: Diagnosis present

## 2019-11-06 DIAGNOSIS — R05 Cough: Secondary | ICD-10-CM | POA: Diagnosis present

## 2019-11-06 DIAGNOSIS — Y92239 Unspecified place in hospital as the place of occurrence of the external cause: Secondary | ICD-10-CM | POA: Diagnosis present

## 2019-11-06 DIAGNOSIS — Z7951 Long term (current) use of inhaled steroids: Secondary | ICD-10-CM

## 2019-11-06 DIAGNOSIS — Z21 Asymptomatic human immunodeficiency virus [HIV] infection status: Secondary | ICD-10-CM | POA: Diagnosis present

## 2019-11-06 DIAGNOSIS — J189 Pneumonia, unspecified organism: Secondary | ICD-10-CM | POA: Diagnosis present

## 2019-11-06 DIAGNOSIS — Z886 Allergy status to analgesic agent status: Secondary | ICD-10-CM

## 2019-11-06 DIAGNOSIS — Z79891 Long term (current) use of opiate analgesic: Secondary | ICD-10-CM

## 2019-11-06 DIAGNOSIS — K219 Gastro-esophageal reflux disease without esophagitis: Secondary | ICD-10-CM | POA: Diagnosis present

## 2019-11-06 DIAGNOSIS — Z885 Allergy status to narcotic agent status: Secondary | ICD-10-CM

## 2019-11-06 DIAGNOSIS — J440 Chronic obstructive pulmonary disease with acute lower respiratory infection: Secondary | ICD-10-CM | POA: Diagnosis present

## 2019-11-06 DIAGNOSIS — Z79899 Other long term (current) drug therapy: Secondary | ICD-10-CM

## 2019-11-06 DIAGNOSIS — D72829 Elevated white blood cell count, unspecified: Secondary | ICD-10-CM | POA: Diagnosis present

## 2019-11-06 DIAGNOSIS — R059 Cough, unspecified: Secondary | ICD-10-CM | POA: Diagnosis present

## 2019-11-06 DIAGNOSIS — T380X5A Adverse effect of glucocorticoids and synthetic analogues, initial encounter: Secondary | ICD-10-CM | POA: Diagnosis present

## 2019-11-06 LAB — BASIC METABOLIC PANEL
Anion gap: 12 (ref 5–15)
BUN: 21 mg/dL — ABNORMAL HIGH (ref 6–20)
CO2: 18 mmol/L — ABNORMAL LOW (ref 22–32)
Calcium: 9.1 mg/dL (ref 8.9–10.3)
Chloride: 106 mmol/L (ref 98–111)
Creatinine, Ser: 1.15 mg/dL — ABNORMAL HIGH (ref 0.44–1.00)
GFR calc Af Amer: >60 mL/min (ref >60)
GFR calc non Af Amer: 53 mL/min — ABNORMAL LOW (ref >60)
Glucose, Bld: 148 mg/dL — ABNORMAL HIGH (ref 70–99)
Potassium: 3.7 mmol/L (ref 3.5–5.1)
Sodium: 136 mmol/L (ref 135–145)

## 2019-11-06 LAB — CBC
HCT: 35.2 % — ABNORMAL LOW (ref 36.0–46.0)
Hemoglobin: 11.3 g/dL — ABNORMAL LOW (ref 12.0–15.0)
MCH: 27.4 pg (ref 26.0–34.0)
MCHC: 32.1 g/dL (ref 30.0–36.0)
MCV: 85.2 fL (ref 80.0–100.0)
Platelets: 467 10*3/uL — ABNORMAL HIGH (ref 150–400)
RBC: 4.13 MIL/uL (ref 3.87–5.11)
RDW: 15.9 % — ABNORMAL HIGH (ref 11.5–15.5)
WBC: 20.2 10*3/uL — ABNORMAL HIGH (ref 4.0–10.5)
nRBC: 0 % (ref 0.0–0.2)

## 2019-11-06 LAB — C-REACTIVE PROTEIN: CRP: 3.1 mg/dL — ABNORMAL HIGH (ref <1.0)

## 2019-11-06 LAB — LACTIC ACID, PLASMA
Lactic Acid, Venous: 3.4 mmol/L (ref 0.5–1.9)
Lactic Acid, Venous: 2.8 mmol/L (ref 0.5–1.9)

## 2019-11-06 LAB — LACTATE DEHYDROGENASE: LDH: 188 U/L (ref 98–192)

## 2019-11-06 LAB — FIBRINOGEN: Fibrinogen: 472 mg/dL (ref 210–475)

## 2019-11-06 LAB — RESPIRATORY PANEL BY RT PCR (FLU A&B, COVID)
Influenza A by PCR: NEGATIVE
Influenza B by PCR: NEGATIVE
SARS Coronavirus 2 by RT PCR: NEGATIVE

## 2019-11-06 LAB — STREP PNEUMONIAE URINARY ANTIGEN: Strep Pneumo Urinary Antigen: NEGATIVE

## 2019-11-06 LAB — TROPONIN I (HIGH SENSITIVITY)
Troponin I (High Sensitivity): 7 ng/L (ref <18)
Troponin I (High Sensitivity): 8 ng/L (ref <18)

## 2019-11-06 LAB — APTT: aPTT: 26 seconds (ref 24–36)

## 2019-11-06 LAB — PROCALCITONIN: Procalcitonin: <0.1 ng/mL

## 2019-11-06 LAB — PROTIME-INR
INR: 1.1 (ref 0.8–1.2)
Prothrombin Time: 13.6 seconds (ref 11.4–15.2)

## 2019-11-06 MED ORDER — HYDROCOD POLST-CPM POLST ER 10-8 MG/5ML PO SUER
5.0000 mL | Freq: Once | ORAL | Status: AC
  Administered 2019-11-06: 5 mL via ORAL
  Filled 2019-11-06: qty 5

## 2019-11-06 MED ORDER — PANTOPRAZOLE SODIUM 40 MG PO TBEC
40.0000 mg | DELAYED_RELEASE_TABLET | Freq: Every day | ORAL | Status: DC
  Administered 2019-11-06 – 2019-11-08 (×3): 40 mg via ORAL
  Filled 2019-11-06 (×3): qty 1

## 2019-11-06 MED ORDER — SODIUM CHLORIDE 0.9 % IV SOLN: INTRAVENOUS | Status: DC

## 2019-11-06 MED ORDER — ENOXAPARIN SODIUM 40 MG/0.4ML ~~LOC~~ SOLN
40.0000 mg | SUBCUTANEOUS | Status: DC
  Administered 2019-11-06: 40 mg via SUBCUTANEOUS
  Filled 2019-11-06: qty 0.4

## 2019-11-06 MED ORDER — DEXAMETHASONE SODIUM PHOSPHATE 10 MG/ML IJ SOLN
10.0000 mg | Freq: Once | INTRAMUSCULAR | Status: AC
  Administered 2019-11-06: 10 mg via INTRAVENOUS
  Filled 2019-11-06: qty 1

## 2019-11-06 MED ORDER — ELVITEG-COBIC-EMTRICIT-TENOFAF 150-150-200-10 MG PO TABS
1.0000 | ORAL_TABLET | Freq: Every day | ORAL | Status: DC
  Administered 2019-11-06: 1 via ORAL
  Filled 2019-11-06: qty 1

## 2019-11-06 MED ORDER — ONDANSETRON 4 MG PO TBDP
4.0000 mg | ORAL_TABLET | Freq: Three times a day (TID) | ORAL | Status: DC | PRN
  Administered 2019-11-06: 4 mg via ORAL
  Filled 2019-11-06: qty 1

## 2019-11-06 MED ORDER — TRAMADOL HCL 50 MG PO TABS
50.0000 mg | ORAL_TABLET | Freq: Four times a day (QID) | ORAL | Status: DC | PRN
  Administered 2019-11-06 – 2019-11-08 (×5): 50 mg via ORAL
  Filled 2019-11-06 (×5): qty 1

## 2019-11-06 MED ORDER — POLYETHYLENE GLYCOL 3350 17 G PO PACK
17.0000 g | PACK | Freq: Every day | ORAL | Status: DC | PRN
  Administered 2019-11-06 – 2019-11-07 (×2): 17 g via ORAL
  Filled 2019-11-06 (×2): qty 1

## 2019-11-06 MED ORDER — GUAIFENESIN-CODEINE 100-10 MG/5ML PO SOLN
5.0000 mL | Freq: Four times a day (QID) | ORAL | Status: DC | PRN
  Administered 2019-11-07: 5 mL via ORAL
  Filled 2019-11-06 (×2): qty 5

## 2019-11-06 MED ORDER — GUAIFENESIN-CODEINE 100-10 MG/5ML PO SOLN
10.0000 mL | ORAL | Status: DC | PRN
  Administered 2019-11-06 – 2019-11-08 (×6): 10 mL via ORAL
  Filled 2019-11-06 (×6): qty 10

## 2019-11-06 MED ORDER — HYDROCHLOROTHIAZIDE 25 MG PO TABS
25.0000 mg | ORAL_TABLET | Freq: Every day | ORAL | Status: DC
  Administered 2019-11-06 – 2019-11-08 (×3): 25 mg via ORAL
  Filled 2019-11-06 (×3): qty 1

## 2019-11-06 MED ORDER — VALACYCLOVIR HCL 500 MG PO TABS
1000.0000 mg | ORAL_TABLET | Freq: Every day | ORAL | Status: DC
  Administered 2019-11-06 – 2019-11-07 (×2): 1000 mg via ORAL
  Filled 2019-11-06 (×3): qty 2

## 2019-11-06 MED ORDER — PREDNISONE 20 MG PO TABS
60.0000 mg | ORAL_TABLET | Freq: Every day | ORAL | Status: DC
  Administered 2019-11-07 – 2019-11-08 (×2): 60 mg via ORAL
  Filled 2019-11-06 (×2): qty 3

## 2019-11-06 MED ORDER — ALBUTEROL SULFATE (2.5 MG/3ML) 0.083% IN NEBU: 2.5000 mg | INHALATION_SOLUTION | Freq: Four times a day (QID) | RESPIRATORY_TRACT | Status: DC | PRN

## 2019-11-06 MED ORDER — SODIUM CHLORIDE 0.9 % IV SOLN
2.0000 g | INTRAVENOUS | Status: DC
  Administered 2019-11-06: 2 g via INTRAVENOUS
  Filled 2019-11-06: qty 20

## 2019-11-06 MED ORDER — SODIUM CHLORIDE 0.9 % IV SOLN
500.0000 mg | INTRAVENOUS | Status: DC
  Filled 2019-11-06: qty 500

## 2019-11-06 MED ORDER — SODIUM CHLORIDE 0.9 % IV SOLN
500.0000 mg | INTRAVENOUS | Status: DC
  Administered 2019-11-06 – 2019-11-08 (×3): 500 mg via INTRAVENOUS
  Filled 2019-11-06 (×2): qty 500

## 2019-11-06 MED ORDER — ENOXAPARIN SODIUM 40 MG/0.4ML ~~LOC~~ SOLN
40.0000 mg | Freq: Two times a day (BID) | SUBCUTANEOUS | Status: DC
  Administered 2019-11-06 – 2019-11-08 (×4): 40 mg via SUBCUTANEOUS
  Filled 2019-11-06 (×4): qty 0.4

## 2019-11-06 MED ORDER — LOSARTAN POTASSIUM 50 MG PO TABS
50.0000 mg | ORAL_TABLET | Freq: Every day | ORAL | Status: DC
  Administered 2019-11-06 – 2019-11-08 (×3): 50 mg via ORAL
  Filled 2019-11-06 (×3): qty 1

## 2019-11-06 MED ORDER — SODIUM CHLORIDE 0.9 % IV SOLN
2.0000 g | INTRAVENOUS | Status: DC
  Administered 2019-11-07 – 2019-11-08 (×2): 2 g via INTRAVENOUS
  Filled 2019-11-06: qty 2
  Filled 2019-11-06: qty 20

## 2019-11-06 MED ORDER — MOMETASONE FURO-FORMOTEROL FUM 200-5 MCG/ACT IN AERO
2.0000 | INHALATION_SPRAY | Freq: Two times a day (BID) | RESPIRATORY_TRACT | Status: DC
  Administered 2019-11-06 – 2019-11-08 (×4): 2 via RESPIRATORY_TRACT
  Filled 2019-11-06: qty 8.8

## 2019-11-06 MED ORDER — AMLODIPINE BESYLATE 10 MG PO TABS
10.0000 mg | ORAL_TABLET | Freq: Every day | ORAL | Status: DC
  Administered 2019-11-06 – 2019-11-08 (×3): 10 mg via ORAL
  Filled 2019-11-06 (×3): qty 1

## 2019-11-06 NOTE — ED Notes (Signed)
Pt given lunch tray and ginger ale to drink.

## 2019-11-06 NOTE — Consult Note (Signed)
Infectious Disease     Reason for Consult:PNA, HIV   Referring Physician: Dr Benny Lennert Date of Admission:  11/06/2019   Principal Problem:   Multifocal pneumonia Active Problems:   Asthma, chronic   Cough   HTN (hypertension)   HIV (human immunodeficiency virus infection) ([place])   AKI (acute kidney injury) ([place])   Leukocytosis   HPI: Melanie Bowen is a 58 y.o. female wiith a history of well-controlled HIV (last CD4 > 1000, VL < 20), chronic bronchitis, COPD who has had a cough and shortness of breath for 2-3 days.  She was seen March 7 at the ED.  At that time CT scan showed multifocal infiltrates but her cc Covid testing was negative.  BNP at that time was normal pro calcitonin less than 0.19 white blood count was 6.5.  She was treated with azithromycin and steroids.  Today she presents with worsening symptoms.  White count is up to 20.  Lactic acid was elevated at 3.4.  Repeat Covid testing and influenza testing are negative. Done as otpt 2/2 and 2/25 and at [facility] 3/7 and today.   Procalcitonin iz < 0.1. The x-ray done today shows some progression of the patchy bilateral pneumonia. She is seen on the medical floor today and already reports feeling better since admit. She has been smoking heavily the last few weeks. Has lots of covid exposure at [employer] where she works. Had both of her covid shots and finished last week.  States she gets similar illness yearly, often precipitated after smoking heavily.   Past Medical History:  Diagnosis Date  . Acute GI hemorrhage 12/2015   required transfusion  . Allergic rhinitis   . Arthritis    DDD  . Asthma   . Blepharitis   . Bronchitis   . Cigarette smoker   . Claustrophobia   . COPD (chronic obstructive pulmonary disease) ([place])   . Cough   . DDD (degenerative disc disease), lumbar 04/10/2014  . Depression   . Diverticulosis   . Ectopic pregnancy   . Eye irritation   . Genital herpes   . GERD (gastroesophageal reflux disease)    . Headache   . HIV (human immunodeficiency virus infection) ([place])   . Hypertension   . Paresthesia of hand, bilateral   . Shortness of breath dyspnea    at times due to asthma   Past Surgical History:  Procedure Laterality Date  . BACK SURGERY  12/2014   lumbar lam. Dr. Deri Fuelling  . BACK SURGERY  12/25/2012   Removal lumbosubarachnoid shunt system, L5-S1 TF ESI, Dr. Virl Axe Minchew  . back surgery may 2016     lumbar   . BUNIONECTOMY Bilateral    feet  . COLONOSCOPY  12/06/2014   rescheduled from 11/01/2014 due to poor prep  . DIAGNOSTIC LAPAROSCOPY    . DISTAL FEMUR OSTEOTOMY Right    Dr. Earnestine Leys  . ESOPHAGOGASTRODUODENOSCOPY Left 01/11/2016   Procedure: ESOPHAGOGASTRODUODENOSCOPY (EGD);  Surgeon: Hulen Luster, MD;  [facility] ENDOSCOPY;  Service: Endoscopy;  Laterality: Left;  . Finger fracture Right    5th finger  . FRACTURE SURGERY Right 01/25/2014   closed reduction & percutaneous pinning of 5th digit  . HAND SURGERY Bilateral    carpal tunnel  . JOINT REPLACEMENT Bilateral    knees  . KNEE SURGERY Bilateral 2003,2007   total Joint  . LAPAROSCOPY FOR ECTOPIC PREGNANCY    . PERIPHERAL VASCULAR CATHETERIZATION N/A 01/12/2016   Procedure:  Visceral Angiography;  Surgeon: Algernon Huxley, MD;  Location: [facility];  Service: Cardiovascular;  Laterality: N/A;  . PERIPHERAL VASCULAR CATHETERIZATION N/A 01/12/2016   Procedure: Visceral Artery Intervention;  Surgeon: Algernon Huxley, MD;  Location: [facility];  Service: Cardiovascular;  Laterality: N/A;  . REVISION TOTAL KNEE ARTHROPLASTY Right 12/31/2002   Dr. Marry Guan  . TUBAL LIGATION    . VIDEO BRONCHOSCOPY N/A 06/30/2015   Procedure: VIDEO BRONCHOSCOPY WITHOUT FLUORO;  Surgeon: Vilinda Boehringer, MD;  Location: [facility] ORS;  Service: Cardiopulmonary;  Laterality: N/A;   Social History   Tobacco Use  . Smoking status: Current Some Day Smoker    Packs/day: 0.25    Years: 38.00    Pack years: 9.50     Types: Cigarettes  . Smokeless tobacco: Never Used  . Tobacco comment: 1 cig daily  Substance Use Topics  . Alcohol use: Yes    Alcohol/week: 5.0 standard drinks    Types: 5 Glasses of wine per week    Comment: occ  . Drug use: Not Currently   Family History  Problem Relation Age of Onset  . Breast cancer Sister     Allergies:  Allergies  Allergen Reactions  . Acetaminophen Nausea And Vomiting  . Ibuprofen Other (See Comments)    Reports causes bleeding  . Gabapentin Rash  . Morphine Itching and Rash  . Morphine And Related Itching  . Zanaflex  [Tizanidine] Rash    Current antibiotics: Antibiotics Given (last 72 hours)    Date/Time Action Medication Dose Rate   11/06/19 0446 New Bag/Given   cefTRIAXone (ROCEPHIN) 2 g in sodium chloride 0.9 % 100 mL IVPB 2 g 200 mL/hr   11/06/19 0603 New Bag/Given   azithromycin (ZITHROMAX) 500 mg in sodium chloride 0.9 % 250 mL IVPB 500 mg 250 mL/hr      MEDICATIONS: . enoxaparin (LOVENOX) injection  40 mg Subcutaneous Q12H    Review of Systems - 11 systems reviewed and negative per HPI   OBJECTIVE: Temp:  [98.4 F (36.9 C)] 98.4 F (36.9 C) (03/09 0150) Pulse Rate:  [97-110] 97 (03/09 0400) Resp:  [20-31] 31 (03/09 0400) BP: (133-136)/(55-69) 133/55 (03/09 0400) SpO2:  [90 %-95 %] 90 % (03/09 0400) Weight:  [122.9 kg] 122.9 kg (03/09 0151) Physical Exam  Constitutional:  oriented to person, place, and time. appears well-developed and well-nourished. No distress.  HENT: [place]/AT, PERRLA, no scleral icterus Mouth/Throat: Oropharynx is clear and moist. No oropharyngeal exudate.  Cardiovascular: Normal rate, regular rhythm and normal heart sounds. Exam reveals no gallop and no friction rub.  No murmur heard.  Pulmonary/Chest: Effort normal and breath sounds normal. No respiratory distress.  has no wheezes.  Neck = supple, no nuchal rigidity Abdominal: Soft. Bowel sounds are normal.  exhibits no distension. There is no  tenderness.  Lymphadenopathy: no cervical adenopathy. No axillary adenopathy Neurological: alert and oriented to person, place, and time.  Skin: Skin is warm and dry. No rash noted. No erythema.  Psychiatric: a normal mood and affect.  behavior is normal.    LABS: Work but has been teseted fr No components found for: ESR, C REACTIVE PROTEIN MICRO: Recent Results (from the past 720 hour(s))  SARS CORONAVIRUS 2 (TAT 6-24 HRS) Nasopharyngeal Nasopharyngeal Swab     Status: None   Collection Time: 11/04/19 11:17 AM   Specimen: Nasopharyngeal Swab  Result Value Ref Range Status   SARS Coronavirus 2 NEGATIVE NEGATIVE Final    Comment: (NOTE)  SARS-CoV-2 target nucleic acids are NOT DETECTED. The SARS-CoV-2 RNA is generally detectable in upper and lower respiratory specimens during the acute phase of infection. Negative results do not preclude SARS-CoV-2 infection, do not rule out co-infections with other pathogens, and should not be used as the sole basis for treatment or other patient management decisions. Negative results must be combined with clinical observations, patient history, and epidemiological information. The expected result is Negative. Fact Sheet for Patients: SugarRoll.be Fact Sheet for Healthcare Providers: https://www.woods-mathews.com/ This test is not yet approved or cleared by the [place] FDA and  has been authorized for detection and/or diagnosis of SARS-CoV-2 by FDA under an Emergency Use Authorization (EUA). This EUA will remain  in effect (meaning this test can be used) for the duration of the COVID-19 declaration under Section 56 4(b)(1) of the Act, 21 U.S.C. section 360bbb-3(b)(1), unless the authorization is terminated or revoked sooner. Performed at [facility], [place] [address] [place], [place] [address]   Blood Culture (routine x 2)     Status: None (Preliminary result)   Collection Time: 11/06/19   4:31 AM   Specimen: BLOOD  Result Value Ref Range Status   Specimen Description BLOOD LEFT ANTECUBITAL  Final   Special Requests   Final    BOTTLES DRAWN AEROBIC AND ANAEROBIC Blood Culture adequate volume   Culture   Final    NO GROWTH <12 HOURS Performed at [facility], [address], [place], [place] [address]    Report Status PENDING  Incomplete  Blood Culture (routine x 2)     Status: None (Preliminary result)   Collection Time: 11/06/19  4:31 AM   Specimen: BLOOD  Result Value Ref Range Status   Specimen Description BLOOD RIGHT ANTECUBITAL  Final   Special Requests   Final    BOTTLES DRAWN AEROBIC AND ANAEROBIC Blood Culture adequate volume   Culture   Final    NO GROWTH <12 HOURS Performed at [facility], [address], [place], [place] [address]    Report Status PENDING  Incomplete  Respiratory Panel by RT PCR (Flu A&B, Covid) - Nasopharyngeal Swab     Status: None   Collection Time: 11/06/19  4:31 AM   Specimen: Nasopharyngeal Swab  Result Value Ref Range Status   SARS Coronavirus 2 by RT PCR NEGATIVE NEGATIVE Final    Comment: (NOTE) SARS-CoV-2 target nucleic acids are NOT DETECTED. The SARS-CoV-2 RNA is generally detectable in upper respiratoy specimens during the acute phase of infection. The lowest concentration of SARS-CoV-2 viral copies this assay can detect is 131 copies/mL. A negative result does not preclude SARS-Cov-2 infection and should not be used as the sole basis for treatment or other patient management decisions. A negative result may occur with  improper specimen collection/handling, submission of specimen other than nasopharyngeal swab, presence of viral mutation(s) within the areas targeted by this assay, and inadequate number of viral copies (<131 copies/mL). A negative result must be combined with clinical observations, patient history, and epidemiological information. The expected result is Negative. Fact Sheet for  Patients:  PinkCheek.be Fact Sheet for Healthcare Providers:  GravelBags.it This test is not yet ap proved or cleared by the [place] FDA and  has been authorized for detection and/or diagnosis of SARS-CoV-2 by FDA under an Emergency Use Authorization (EUA). This EUA will remain  in effect (meaning this test can be used) for the duration of the COVID-19 declaration under Section 564(b)(1) of the Act, 21  U.S.C. section 360bbb-3(b)(1), unless the authorization is terminated or revoked sooner.    Influenza A by PCR NEGATIVE NEGATIVE Final   Influenza B by PCR NEGATIVE NEGATIVE Final    Comment: (NOTE) The Xpert Xpress SARS-CoV-2/FLU/RSV assay is intended as an aid in  the diagnosis of influenza from Nasopharyngeal swab specimens and  should not be used as a sole basis for treatment. Nasal washings and  aspirates are unacceptable for Xpert Xpress SARS-CoV-2/FLU/RSV  testing. Fact Sheet for Patients: PinkCheek.be Fact Sheet for Healthcare Providers: GravelBags.it This test is not yet approved or cleared by the [place] FDA and  has been authorized for detection and/or diagnosis of SARS-CoV-2 by  FDA under an Emergency Use Authorization (EUA). This EUA will remain  in effect (meaning this test can be used) for the duration of the  Covid-19 declaration under Section 564(b)(1) of the Act, 21  U.S.C. section 360bbb-3(b)(1), unless the authorization is  terminated or revoked. Performed at [facility], [address] [place], [place] [address]     IMAGING: [place] Chest 2 View  Result Date: 11/06/2019 CLINICAL DATA:  59 year old female with shortness of breath and chest pain. Negative for COVID-19 on 11/04/2019. EXAM: CHEST - 2 VIEW COMPARISON:  CTA chest 11/04/2019 and earlier. FINDINGS: Stable lung volumes. Mediastinal contours remain normal. Visualized  tracheal air column is within normal limits. Patchy and indistinct bilateral pulmonary opacity with basilar predominance superimposed on more chronic increased interstitial markings. No pneumothorax or pleural effusion. Basilar opacity probably is increased from the recent CTA. Negative visible bowel gas pattern. No acute osseous abnormality identified. IMPRESSION: Patchy and indistinct bilateral pneumonia with some radiographic progression suspected since the CTA on 11/04/2019. No pleural effusion. Electronically Signed   By: Genevie Ann M.D.   On: 11/06/2019 02:23   CT Angio Chest PE W and/or Wo Contrast  Result Date: 11/04/2019 CLINICAL DATA:  Shortness of breath, chest pain and cough. Negative COVID-19 test. EXAM: CT ANGIOGRAPHY CHEST WITH CONTRAST TECHNIQUE: Multidetector CT imaging of the chest was performed using the standard protocol during bolus administration of intravenous contrast. Multiplanar CT image reconstructions and MIPs were obtained to evaluate the vascular anatomy. CONTRAST:  133m OMNIPAQUE IOHEXOL 350 MG/ML SOLN COMPARISON:  05/22/2019 FINDINGS: Cardiovascular: Normally opacified pulmonary arteries with no pulmonary arterial filling defects seen. Atheromatous calcifications, including the coronary arteries and aorta. Normal sized heart. Mediastinum/Nodes: No enlarged mediastinal, hilar, or axillary lymph nodes. Thyroid gland, trachea, and esophagus demonstrate no significant findings. Lungs/Pleura: Interval multiple areas of patchy opacity in both lungs in a somewhat peripheral distribution. These are involving both upper lobes, lingula and left lower lobe. This is most pronounced in the right upper lobe. No pleural fluid. Upper Abdomen: No acute abnormality. Musculoskeletal: Thoracic and lower cervical spine degenerative changes. Bilateral glenohumeral degenerative changes. Review of the MIP images confirms the above findings. IMPRESSION: 1. Interval multiple areas of patchy opacity in both  lungs, most pronounced in the right upper lobe. This is compatible with multifocal pneumonia with an appearance suspicious for COVID-19 infection despite the recent negative COVID test. 2. No pulmonary emboli. 3. Calcific coronary artery and aortic atherosclerosis. Aortic Atherosclerosis (ICD10-I70.0). Electronically Signed   By: SClaudie ReveringM.D.   On: 11/04/2019 09:58    Assessment:   Melanie HOUSEis a 59y.o. female with very well controlled, long standing HIV (CD4 > 100, VL < 20), intermittent tobacco abuse, COPD, admit with one week - ten days of cough, sob after smoking heavily  for last few weeks. Reports frequent episodes like this yearly. Has lots of covid exposure at work at [employer] but has been tested many time and negative. Has had her second covid shot last week. Since CD4 > 1000 no risk opportunistic infections like PCP. Would treat for atypical PNA and with steroids and follow.   Recommendations Ceftriaxone and azitrho Pred 60 mg po QD  Discussed smoking cessation.  Will follow.  Thank you very much for allowing me to participate in the care of this patient. Please call with questions.   Cheral Marker. [name], MD

## 2019-11-06 NOTE — Progress Notes (Signed)
Patient arrived from the ED

## 2019-11-06 NOTE — ED Notes (Signed)
Pt placed on 2L of O2 per MD Owens Shark verbal order.

## 2019-11-06 NOTE — ED Triage Notes (Addendum)
Pt to triage via wheelchair.  Pt reports sob and chest pain .  Pt was seen in ER with similar sx 2 days ago.  Pt has a cough.  cig smoker  Pt anxious.   Pt alert

## 2019-11-06 NOTE — Progress Notes (Signed)
Anticoagulation monitoring(Lovenox):  59yo  female ordered Lovenox 40 mg Q24h  Filed Weights   11/06/19 0151  Weight: 271 lb (122.9 kg)   Body mass index is 43.74 kg/m.    Lab Results  Component Value Date   CREATININE 1.15 (H) 11/06/2019   CREATININE 1.02 (H) 11/04/2019   CREATININE 0.84 08/26/2019   Estimated Creatinine Clearance: 72.2 mL/min (A) (by C-G formula based on SCr of 1.15 mg/dL (H)). Hemoglobin & Hematocrit     Component Value Date/Time   HGB 11.3 (L) 11/06/2019 0156   HGB 9.4 (L) 11/29/2016 0713   HCT 35.2 (L) 11/06/2019 0156   HCT 29.8 (L) 11/29/2016 4114     Per Protocol for Patient with estCrcl > 30 ml/min and BMI > 40, will transition to Lovenox 40 mg Q12h.

## 2019-11-06 NOTE — Progress Notes (Signed)
CODE SEPSIS - PHARMACY COMMUNICATION  **Broad Spectrum Antibiotics should be administered within 1 hour of Sepsis diagnosis**  Time Code Sepsis Called/Page Received: 0409  Antibiotics Ordered: azithro/ceftriaxone  Time of 1st antibiotic administration: 0446  Additional action taken by pharmacy:   If necessary, Name of Provider/Nurse Contacted:     Tobie Lords ,PharmD Clinical Pharmacist  11/06/2019  7:08 AM

## 2019-11-06 NOTE — Progress Notes (Addendum)
This patient is a 59 yr old woman who has a past medical history significant for HTN, asthma, HIV positive with undetectable viral load per patient. She presented to the ED with complaints of cough and shortness of breath. She was seen in the ED for the same issue 2 days ago and was sent home on steroid taper. She states that she has been tested for COVID 10 x and that she is always negative. Most recently she was tested this morning and was negative.  In the ED she was afebrile at 98.4, HR 110, O2 sat 94% on room air with ambulation, tachypnea 20.  WBC 20,000, Hb 11.3, creatinine 1.15 up from 0.78 a few months prior.  3 showed patchy and indistinct bilateral pneumonia with some radiographic progression suspected since the CTA on 11/04/2019.  Patient was treated with Rocephin and azithromycin and given a dose of dexamethasone for high suspicion of Covid.  Hospitalist consulted for admission. She was admitted by my colleague early this morning. Rapid Covid, inflammatory markers pending at the time of admission.  Procalcitonin is negative. WBC is 20.2, but she was started on steroids 2 days ago. CRP is 3.1, and lactic acid is 2.8. Creatinine is 1.15.   The patient has been admitted to a medical bed. She is being treated with Azithromycin, rocephin, Tussionex, nebulizer treatments, and prednisone. She is being continued on her HIV meds as well as her antihypertensives.  The patient is resting comfortably. No new complaints. She is awake, alert, and oriented x 3. She states that she is coughing up sputum, and gets very short of breath with activity. Heart and lung exams are within normal limits. Abdomen is soft, non-tender, and non-distended. Extremities are negative for cyanosis, clubbing, or edema.  I have consulted infectious disease. I do not believe that this patient has a bacterial pneumonia given the negative procalcitonin. I think that the elevated WBC is due to steroids. VRP is negative, as is  COVID-19.

## 2019-11-06 NOTE — H&P (Signed)
History and Physical    Melanie Bowen AST:[id number] DOB: [birth year] DOA: 11/06/2019  PCP: Melanie Burrow, MD   Patient coming from: home  I have personally briefly reviewed patient's old medical records in [place]  Chief Complaint: incessant coughing, SOB  HPI: Melanie Bowen is a 59 y.o. 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  Her cough is nonproductive, shortness of breath is with exercise and not improved significantly with her home bronchodilators.  She also has midsternal, nonradiating chest discomfort, worse with her coughing, with no associated nausea vomiting diaphoresis.  When she was worked up in the ER 2 days prior, Covid PCR was negative.  Her procalcitonin was less than 0.1.  He had a CT of the chest that showed interval multiple areas of patchy opacity in both lungs most pronounced in the right upper lobe compatible with multifocal pneumonia and suspicious for COVID-19 infection despite recent negative Covid test.  She was treated with several rounds of DuoNeb's as well as IV Solu-Medrol, Tessalon Perles and guaifenesin.  She returns to the ER on 11/06/2019 with similar complaints and no improvement in her symptoms.  ED Course: In the ER she was afebrile at 98.4, HR 110, O2 sat 94% on room air with ambulation, tachypnea 20.  WBC 20,000, Hb 11.3, creatinine 1.15 up from 0.78 a few months prior.  3 showed patchy and indistinct bilateral pneumonia with some radiographic progression suspected since the CTA on 11/04/2019.  Patient was treated with Rocephin and azithromycin and given a dose of dexamethasone for high suspicion of Covid.  Hospitalist consulted for admission.  Rapid Covid,  inflammatory markers pending at the time of admission  Review of Systems: As per HPI otherwise 10 point review of systems negative.    Past Medical History:  Diagnosis Date  . Acute GI hemorrhage 12/2015   required transfusion  . Allergic rhinitis   . Arthritis    DDD  . Asthma   . Blepharitis   . Bronchitis   . Cigarette smoker   . Claustrophobia   . COPD (chronic obstructive pulmonary disease) ([place])   . Cough   . DDD (degenerative disc disease), lumbar 04/10/2014  . Depression   . Diverticulosis   . Ectopic pregnancy   . Eye irritation   . Genital herpes   . GERD (gastroesophageal reflux disease)   . Headache   . HIV (human immunodeficiency virus infection) ([place])   . Hypertension   . Paresthesia of hand, bilateral   . Shortness of breath dyspnea    at times due to asthma    Past Surgical History:  Procedure Laterality Date  . BACK SURGERY  12/2014   lumbar lam. Dr. Deri Fuelling  . BACK SURGERY  12/25/2012   Removal lumbosubarachnoid shunt system, L5-S1 TF ESI, Dr. Virl Axe Minchew  . back surgery may 2016     lumbar   . BUNIONECTOMY Bilateral    feet  . COLONOSCOPY  12/06/2014   rescheduled from 11/01/2014 due to poor prep  . DIAGNOSTIC LAPAROSCOPY    . DISTAL FEMUR OSTEOTOMY Right    Dr. Earnestine Leys  . ESOPHAGOGASTRODUODENOSCOPY Left 01/11/2016   Procedure: ESOPHAGOGASTRODUODENOSCOPY (EGD);  Surgeon: Hulen Luster,  MD;  Location: [facility];  Service: Endoscopy;  Laterality: Left;  . Finger fracture Right    5th finger  . FRACTURE SURGERY Right 01/25/2014   closed reduction & percutaneous pinning of 5th digit  . HAND SURGERY Bilateral    carpal tunnel  . JOINT REPLACEMENT Bilateral    knees  . KNEE SURGERY Bilateral 2003,2007   total Joint  . LAPAROSCOPY FOR ECTOPIC PREGNANCY    . PERIPHERAL VASCULAR CATHETERIZATION N/A 01/12/2016   Procedure: Visceral Angiography;  Surgeon: Algernon Huxley, MD;  Location: [facility];  Service: Cardiovascular;   Laterality: N/A;  . PERIPHERAL VASCULAR CATHETERIZATION N/A 01/12/2016   Procedure: Visceral Artery Intervention;  Surgeon: Algernon Huxley, MD;  Location: [facility];  Service: Cardiovascular;  Laterality: N/A;  . REVISION TOTAL KNEE ARTHROPLASTY Right 12/31/2002   Dr. Marry Guan  . TUBAL LIGATION    . VIDEO BRONCHOSCOPY N/A 06/30/2015   Procedure: VIDEO BRONCHOSCOPY WITHOUT FLUORO;  Surgeon: Vilinda Boehringer, MD;  Location: [facility];  Service: Cardiopulmonary;  Laterality: N/A;     reports that she has been smoking cigarettes. She has a 9.50 pack-year smoking history. She has never used smokeless tobacco. She reports current alcohol use of about 5.0 standard drinks of alcohol per week. She reports previous drug use.  Allergies  Allergen Reactions  . Acetaminophen     emesis Other reaction(s): Other (See Comments) emesis  . Ibuprofen     Reports causes bleeding Other reaction(s): Other (See Comments) Reports causes bleeding  . Gabapentin Rash  . Morphine Itching and Rash  . Morphine And Related Itching  . Zanaflex  [Tizanidine] Rash    Family History  Problem Relation Age of Onset  . Breast cancer Sister      Prior to Admission medications   Medication Sig Start Date End Date Taking? Authorizing Provider  albuterol (VENTOLIN HFA) 108 (90 Base) MCG/ACT inhaler Inhale 2 puffs into the lungs every 6 (six) hours as needed for wheezing or shortness of breath. 05/21/19   Laban Emperor, PA-C  amLODipine (NORVASC) 10 MG tablet Take 10 mg by mouth daily.     [provider]  azithromycin (ZITHROMAX Z-PAK) 250 MG tablet Take 2 tablets (500 mg) on  Day 1,  followed by 1 tablet (250 mg) once daily on Days 2 through 5. 11/04/19 11/09/19  Vanessa [place], MD  Baclofen 5 MG TABS Take 5 mg by mouth 3 (three) times daily as needed. 08/11/19   Laban Emperor, PA-C  butalbital-acetaminophen-caffeine (FIORICET) [phone] MG tablet Take 1-2 tablets by mouth every 6 (six) hours as needed for  headache. 06/05/19 06/04/20  Lavonia Drafts, MD  DEXILANT 60 MG capsule Take 60 mg by mouth daily. 04/25/19   [provider]  elvitegravir-cobicistat-emtricitabine-tenofovir (GENVOYA) 150-150-200-10 MG TABS tablet Take 1 tablet by mouth at bedtime.    [provider]  famotidine (PEPCID) 20 MG tablet Take 20 mg by mouth 2 (two) times daily. 05/08/19   [provider]  Fluticasone-Salmeterol (ADVAIR DISKUS) 500-50 MCG/DOSE AEPB Inhale 1 puff into the lungs 2 (two) times daily. 05/27/16   Mungal, Roxanne Mins, MD  gabapentin (NEURONTIN) 300 MG capsule Take 300 mg by mouth 3 (three) times daily. 05/08/19   [provider]  guaiFENesin-codeine 100-10 MG/5ML syrup Take 5 mLs by mouth every 6 (six) hours as needed for up to 5 days for cough. 11/04/19 11/09/19  Vanessa [place], MD  hydrochlorothiazide (HYDRODIURIL) 25 MG tablet Take 25 mg by mouth  daily.    [provider]  HYDROcodone-acetaminophen (NORCO) 5-325 MG tablet Take 1 tablet by mouth every 4 (four) hours as needed for moderate pain. 07/29/19   Duanne Guess, PA-C  ipratropium-albuterol (DUONEB) 0.5-2.5 (3) MG/3ML SOLN Inhale 3 mLs into the lungs 3 (three) times daily.     [provider]  losartan (COZAAR) 50 MG tablet Take 50 mg by mouth daily.    [provider]  methocarbamol (ROBAXIN) 500 MG tablet Take 1 tablet (500 mg total) by mouth 4 (four) times daily. 09/15/19   Cuthriell, Charline Bills, PA-C  montelukast (SINGULAIR) 10 MG tablet Take 10 mg by mouth daily.    [provider]  ondansetron (ZOFRAN ODT) 4 MG disintegrating tablet Take 1 tablet (4 mg total) by mouth every 8 (eight) hours as needed for nausea or vomiting. 11/04/19   Vanessa [place], MD  polyethylene glycol (MIRALAX) 17 g packet Take 17 g by mouth daily as needed for moderate constipation. 05/22/19   Gregor Hams, MD  predniSONE (DELTASONE) 20 MG tablet Take 2 tablets (40 mg total) by mouth daily for 5 days. 11/04/19 11/09/19   Vanessa [place], MD  PREVIDENT 5000 SENSITIVE 1.1-5 % PSTE Apply 1 application topically 3 (three) times daily after meals. 01/15/19   [provider]  promethazine (PHENERGAN) 25 MG tablet Take 25 mg by mouth every 6 (six) hours as needed for nausea or vomiting.     [provider]  traMADol (ULTRAM) 50 MG tablet Take 1 tablet (50 mg total) by mouth every 6 (six) hours as needed. 06/05/19 06/04/20  Lavonia Drafts, MD  valACYclovir (VALTREX) 1000 MG tablet Take 1,000 mg by mouth daily. 04/25/19   [provider]    Physical Exam: Vitals:   11/06/19 0150 11/06/19 0151 11/06/19 0400  BP: 136/69  (!) 133/55  Pulse: (!) 110  97  Resp: 20  (!) 31  Temp: 98.4 F (36.9 C)    TempSrc: Oral    SpO2: 95%  90%  Weight:  122.9 kg   Height:  5\' 6"  (1.676 m)      Vitals:   11/06/19 0150 11/06/19 0151 11/06/19 0400  BP: 136/69  (!) 133/55  Pulse: (!) 110  97  Resp: 20  (!) 31  Temp: 98.4 F (36.9 C)    TempSrc: Oral    SpO2: 95%  90%  Weight:  122.9 kg   Height:  5\' 6"  (1.676 m)     Constitutional: Alert and awake, oriented x3, not in any acute distress. Eyes: PERLA, EOMI, irises appear normal, anicteric sclera,  ENMT: external ears and nose appear normal, normal hearing             Lips appears normal, oropharynx mucosa, tongue, posterior pharynx appear normal  Neck: neck appears normal, no masses, normal ROM, no thyromegaly, no JVD  CVS: S1-S2 clear, no murmur rubs or gallops,  , no carotid bruits, pedal pulses palpable, No LE edema Respiratory:  Bilateral scattered crackles but no rhonchi. Respiratory effort normal. No accessory muscle use.  Abdomen: soft nontender, nondistended, normal bowel sounds, no hepatosplenomegaly, no hernias Musculoskeletal: : no cyanosis, clubbing , no contractures or atrophy Neuro: Cranial nerves II-XII intact, sensation, reflexes normal, strength Psych: judgement and insight appear normal, stable mood and affect,  Skin: no rashes or  lesions or ulcers, no induration or nodules   Labs on Admission: I have personally reviewed following labs and imaging studies  CBC: Recent Labs  Lab 11/04/19 0821 11/06/19 0156  WBC 6.5 20.2*  NEUTROABS 2.9  --   HGB 12.1 11.3*  HCT 38.0 35.2*  MCV 87.6 85.2  PLT 508* 258*   Basic Metabolic Panel: Recent Labs  Lab 11/04/19 0821 11/06/19 0156  NA 138 136  K 3.5 3.7  CL 101 106  CO2 24 18*  GLUCOSE 92 148*  BUN 10 21*  CREATININE 1.02* 1.15*  CALCIUM 9.2 9.1   GFR: Estimated Creatinine Clearance: 72.2 mL/min (A) (by C-G formula based on SCr of 1.15 mg/dL (H)). Liver Function Tests: Recent Labs  Lab 11/04/19 0821  AST 19  ALT 13  ALKPHOS 99  BILITOT 0.4  PROT 7.4  ALBUMIN 3.5   No results for input(s): LIPASE, AMYLASE in the last 168 hours. No results for input(s): AMMONIA in the last 168 hours. Coagulation Profile: Recent Labs  Lab 11/06/19 0432  INR 1.1   Cardiac Enzymes: No results for input(s): CKTOTAL, CKMB, CKMBINDEX, TROPONINI in the last 168 hours. BNP (last 3 results) No results for input(s): PROBNP in the last 8760 hours. HbA1C: No results for input(s): HGBA1C in the last 72 hours. CBG: No results for input(s): GLUCAP in the last 168 hours. Lipid Profile: No results for input(s): CHOL, HDL, LDLCALC, TRIG, CHOLHDL, LDLDIRECT in the last 72 hours. Thyroid Function Tests: No results for input(s): TSH, T4TOTAL, FREET4, T3FREE, THYROIDAB in the last 72 hours. Anemia Panel: No results for input(s): VITAMINB12, FOLATE, FERRITIN, TIBC, IRON, RETICCTPCT in the last 72 hours. Urine analysis:    Component Value Date/Time   COLORURINE YELLOW (A) 01/17/2019 1308   APPEARANCEUR CLOUDY (A) 01/17/2019 1308   APPEARANCEUR Hazy 05/19/2014 1633   LABSPEC 1.017 01/17/2019 1308   LABSPEC 1.020 05/19/2014 1633   PHURINE 5.0 01/17/2019 1308   GLUCOSEU NEGATIVE 01/17/2019 1308   GLUCOSEU Negative 05/19/2014 1633   HGBUR SMALL (A) 01/17/2019 1308    BILIRUBINUR NEGATIVE 01/17/2019 1308   BILIRUBINUR Negative 05/19/2014 1633   KETONESUR NEGATIVE 01/17/2019 1308   PROTEINUR NEGATIVE 01/17/2019 1308   NITRITE NEGATIVE 01/17/2019 1308   LEUKOCYTESUR TRACE (A) 01/17/2019 1308   LEUKOCYTESUR Negative 05/19/2014 1633    Radiological Exams on Admission: DG Chest 2 View  Result Date: 11/06/2019 CLINICAL DATA:  59 year old female with shortness of breath and chest pain. Negative for COVID-19 on 11/04/2019. EXAM: CHEST - 2 VIEW COMPARISON:  CTA chest 11/04/2019 and earlier. FINDINGS: Stable lung volumes. Mediastinal contours remain normal. Visualized tracheal air column is within normal limits. Patchy and indistinct bilateral pulmonary opacity with basilar predominance superimposed on more chronic increased interstitial markings. No pneumothorax or pleural effusion. Basilar opacity probably is increased from the recent CTA. Negative visible bowel gas pattern. No acute osseous abnormality identified. IMPRESSION: Patchy and indistinct bilateral pneumonia with some radiographic progression suspected since the CTA on 11/04/2019. No pleural effusion. Electronically Signed   By: Genevie Ann M.D.   On: 11/06/2019 02:23   CT Angio Chest PE W and/or Wo Contrast  Result Date: 11/04/2019 CLINICAL DATA:  Shortness of breath, chest pain and cough. Negative COVID-19 test. EXAM: CT ANGIOGRAPHY CHEST WITH CONTRAST TECHNIQUE: Multidetector CT imaging of the chest was performed using the standard protocol during bolus administration of intravenous contrast. Multiplanar CT image reconstructions and MIPs were obtained to evaluate the vascular anatomy. CONTRAST:  159mL OMNIPAQUE IOHEXOL 350 MG/ML SOLN COMPARISON:  05/22/2019 FINDINGS: Cardiovascular: Normally opacified pulmonary arteries with no pulmonary arterial filling defects seen. Atheromatous calcifications, including the coronary arteries and aorta. Normal  sized heart. Mediastinum/Nodes: No enlarged mediastinal, hilar, or  axillary lymph nodes. Thyroid gland, trachea, and esophagus demonstrate no significant findings. Lungs/Pleura: Interval multiple areas of patchy opacity in both lungs in a somewhat peripheral distribution. These are involving both upper lobes, lingula and left lower lobe. This is most pronounced in the right upper lobe. No pleural fluid. Upper Abdomen: No acute abnormality. Musculoskeletal: Thoracic and lower cervical spine degenerative changes. Bilateral glenohumeral degenerative changes. Review of the MIP images confirms the above findings. IMPRESSION: 1. Interval multiple areas of patchy opacity in both lungs, most pronounced in the right upper lobe. This is compatible with multifocal pneumonia with an appearance suspicious for COVID-19 infection despite the recent negative COVID test. 2. No pulmonary emboli. 3. Calcific coronary artery and aortic atherosclerosis. Aortic Atherosclerosis (ICD10-I70.0). Electronically Signed   By: Claudie Revering M.D.   On: 11/04/2019 09:58    EKG: Independently reviewed.   Assessment/Plan Principal Problem:   Multifocal pneumonia     Cough -Highly suspicious for Covid.  Lower suspicion for pneumocystis jirovecii given patient's reported normal viral load -Procalcitonin was less than 0.10 2 days prior -Leukocytosis of 20,000 compared to 6500 days prior could be related to steroid administration -Follow respiratory panel, CRP, fibrin derivatives and ferritin as well as rapid Covid -Rocephin and azithromycin -Agree with dexamethasone in the ER pending further work-up.   -Antitussives, guaifenesin with codeine    Leukocytosis -Suspect steroid-induced.  Continue to monitor    AKI (acute kidney injury) ([place]) -Possibly related to insensible water losses from incessant coughing -IV hydration and monitor renal function -   Asthma, chronic -Does not appear acutely exacerbated unless this is a cough variant asthma -Albuterol as needed -Consider steroids  Acute on  chronic cough -Suspect infectious related to pneumonia above.   --Given chronic nature can possibly be related to asthma, tobacco use, ARB losartan    HTN (hypertension) -Home hydrochlorothiazide  HIV positive -Continue Genvoya -Obtain viral load record if possible.  Not in Care Everywhere    DVT prophylaxis: Lovenox  Code Status: full code  Family Communication:  none  Disposition Plan: Back to previous home environment Consults called: none  Status:obs    Athena Masse MD [facility]     11/06/2019, 5:04 AM

## 2019-11-06 NOTE — ED Notes (Signed)
Pt given meal tray and water to drink.

## 2019-11-06 NOTE — ED Provider Notes (Signed)
[facility] Emergency Department Provider Note  ____________________________________________   First MD Initiated Contact with Patient 11/06/19 0402     (approximate)  I have reviewed the triage vital signs and the nursing notes.   HISTORY  Chief Complaint Shortness of Breath   HPI Melanie Bowen is a 59 y.o. female with below list of previous medical conditions including COPD and HIV with recent ED visit secondary on 11/04/2019 due to progressive dyspnea x2 days return to the emergency department with worsening dyspnea cough chest discomfort.  Patient denies any fever afebrile on presentation.  Chart review revealed that the patient CT scan on 11/04/2019 revealed multifocal pneumonia with concern for possible COVID-19.  Patient's ZOXWR-60 testing performed at that time was negative.        Past Medical History:  Diagnosis Date  . Acute GI hemorrhage 12/2015   required transfusion  . Allergic rhinitis   . Arthritis    DDD  . Asthma   . Blepharitis   . Bronchitis   . Cigarette smoker   . Claustrophobia   . COPD (chronic obstructive pulmonary disease) ([place])   . Cough   . DDD (degenerative disc disease), lumbar 04/10/2014  . Depression   . Diverticulosis   . Ectopic pregnancy   . Eye irritation   . Genital herpes   . GERD (gastroesophageal reflux disease)   . Headache   . HIV (human immunodeficiency virus infection) ([place])   . Hypertension   . Paresthesia of hand, bilateral   . Shortness of breath dyspnea    at times due to asthma    Patient Active Problem List   Diagnosis Date Noted  . Diverticulitis large intestine 01/17/2019  . Violation of controlled substance agreement 12/05/2017  . Positive urine drug screen (+cocaine) 12/05/2017  . Cervicalgia 12/05/2017  . Drug-seeking behavior 12/05/2017  . Community acquired pneumonia 11/28/2016  . Pneumonia 11/28/2016  . Allergic rhinitis 05/27/2016  . Tobacco abuse counseling 02/11/2016  .  Symptomatic anemia   . Lower GI bleed   . Anemia 01/16/2016  . GI bleed 01/10/2016  . GERD (gastroesophageal reflux disease) 01/10/2016  . HTN (hypertension) 01/10/2016  . Depression 01/10/2016  . HIV (human immunodeficiency virus infection) ([place]) 01/10/2016  . Lumbar pseudoarthrosis 10/31/2015  . Chronic cough   . Cough 06/19/2015  . DDD (degenerative disc disease), lumbar 12/31/2014  . Degenerative disc disease, lumbar 12/31/2014  . Asthma, chronic 11/12/2014    Past Surgical History:  Procedure Laterality Date  . BACK SURGERY  12/2014   lumbar lam. Dr. Deri Fuelling  . BACK SURGERY  12/25/2012   Removal lumbosubarachnoid shunt system, L5-S1 TF ESI, Dr. Virl Axe Minchew  . back surgery may 2016     lumbar   . BUNIONECTOMY Bilateral    feet  . COLONOSCOPY  12/06/2014   rescheduled from 11/01/2014 due to poor prep  . DIAGNOSTIC LAPAROSCOPY    . DISTAL FEMUR OSTEOTOMY Right    Dr. Earnestine Leys  . ESOPHAGOGASTRODUODENOSCOPY Left 01/11/2016   Procedure: ESOPHAGOGASTRODUODENOSCOPY (EGD);  Surgeon: Hulen Luster, MD;  Location: [facility] ENDOSCOPY;  Service: Endoscopy;  Laterality: Left;  . Finger fracture Right    5th finger  . FRACTURE SURGERY Right 01/25/2014   closed reduction & percutaneous pinning of 5th digit  . HAND SURGERY Bilateral    carpal tunnel  . JOINT REPLACEMENT Bilateral    knees  . KNEE SURGERY Bilateral 2003,2007   total Joint  . LAPAROSCOPY FOR ECTOPIC PREGNANCY    .  PERIPHERAL VASCULAR CATHETERIZATION N/A 01/12/2016   Procedure: Visceral Angiography;  Surgeon: Algernon Huxley, MD;  Location: [place] CV LAB;  Service: Cardiovascular;  Laterality: N/A;  . PERIPHERAL VASCULAR CATHETERIZATION N/A 01/12/2016   Procedure: Visceral Artery Intervention;  Surgeon: Algernon Huxley, MD;  Location: [place] CV LAB;  Service: Cardiovascular;  Laterality: N/A;  . REVISION TOTAL KNEE ARTHROPLASTY Right 12/31/2002   Dr. Marry Guan  . TUBAL LIGATION    . VIDEO BRONCHOSCOPY N/A  06/30/2015   Procedure: VIDEO BRONCHOSCOPY WITHOUT FLUORO;  Surgeon: Vilinda Boehringer, MD;  Location: [facility] ORS;  Service: Cardiopulmonary;  Laterality: N/A;    Prior to Admission medications   Medication Sig Start Date End Date Taking? Authorizing Provider  albuterol (VENTOLIN HFA) 108 (90 Base) MCG/ACT inhaler Inhale 2 puffs into the lungs every 6 (six) hours as needed for wheezing or shortness of breath. 05/21/19   Laban Emperor, PA-C  amLODipine (NORVASC) 10 MG tablet Take 10 mg by mouth daily.     [provider]  azithromycin (ZITHROMAX Z-PAK) 250 MG tablet Take 2 tablets (500 mg) on  Day 1,  followed by 1 tablet (250 mg) once daily on Days 2 through 5. 11/04/19 11/09/19  Vanessa Galion, MD  Baclofen 5 MG TABS Take 5 mg by mouth 3 (three) times daily as needed. 08/11/19   Laban Emperor, PA-C  butalbital-acetaminophen-caffeine (FIORICET) [phone] MG tablet Take 1-2 tablets by mouth every 6 (six) hours as needed for headache. 06/05/19 06/04/20  Lavonia Drafts, MD  DEXILANT 60 MG capsule Take 60 mg by mouth daily. 04/25/19   [provider]  elvitegravir-cobicistat-emtricitabine-tenofovir (GENVOYA) 150-150-200-10 MG TABS tablet Take 1 tablet by mouth at bedtime.    [provider]  famotidine (PEPCID) 20 MG tablet Take 20 mg by mouth 2 (two) times daily. 05/08/19   [provider]  Fluticasone-Salmeterol (ADVAIR DISKUS) 500-50 MCG/DOSE AEPB Inhale 1 puff into the lungs 2 (two) times daily. 05/27/16   Mungal, Roxanne Mins, MD  gabapentin (NEURONTIN) 300 MG capsule Take 300 mg by mouth 3 (three) times daily. 05/08/19   [provider]  guaiFENesin-codeine 100-10 MG/5ML syrup Take 5 mLs by mouth every 6 (six) hours as needed for up to 5 days for cough. 11/04/19 11/09/19  Vanessa [place], MD  hydrochlorothiazide (HYDRODIURIL) 25 MG tablet Take 25 mg by mouth daily.    [provider]  HYDROcodone-acetaminophen (NORCO) 5-325 MG tablet Take 1 tablet by mouth every 4 (four)  hours as needed for moderate pain. 07/29/19   Duanne Guess, PA-C  ipratropium-albuterol (DUONEB) 0.5-2.5 (3) MG/3ML SOLN Inhale 3 mLs into the lungs 3 (three) times daily.     [provider]  losartan (COZAAR) 50 MG tablet Take 50 mg by mouth daily.    [provider]  methocarbamol (ROBAXIN) 500 MG tablet Take 1 tablet (500 mg total) by mouth 4 (four) times daily. 09/15/19   Cuthriell, Charline Bills, PA-C  montelukast (SINGULAIR) 10 MG tablet Take 10 mg by mouth daily.    [provider]  ondansetron (ZOFRAN ODT) 4 MG disintegrating tablet Take 1 tablet (4 mg total) by mouth every 8 (eight) hours as needed for nausea or vomiting. 11/04/19   Vanessa , MD  polyethylene glycol (MIRALAX) 17 g packet Take 17 g by mouth daily as needed for moderate constipation. 05/22/19   Gregor Hams, MD  predniSONE (DELTASONE) 20 MG tablet Take 2 tablets (40 mg total) by mouth daily for 5 days. 11/04/19  11/09/19  Vanessa [place], MD  PREVIDENT 5000 SENSITIVE 1.1-5 % PSTE Apply 1 application topically 3 (three) times daily after meals. 01/15/19   [provider]  promethazine (PHENERGAN) 25 MG tablet Take 25 mg by mouth every 6 (six) hours as needed for nausea or vomiting.     [provider]  traMADol (ULTRAM) 50 MG tablet Take 1 tablet (50 mg total) by mouth every 6 (six) hours as needed. 06/05/19 06/04/20  Lavonia Drafts, MD  valACYclovir (VALTREX) 1000 MG tablet Take 1,000 mg by mouth daily. 04/25/19   [provider]    Allergies Acetaminophen, Ibuprofen, Gabapentin, Morphine, Morphine and related, and Zanaflex  [tizanidine]  Family History  Problem Relation Age of Onset  . Breast cancer Sister     Social History Social History   Tobacco Use  . Smoking status: Current Some Day Smoker    Packs/day: 0.25    Years: 38.00    Pack years: 9.50    Types: Cigarettes  . Smokeless tobacco: Never Used  . Tobacco comment: 1 cig daily  Substance Use Topics   . Alcohol use: Yes    Alcohol/week: 5.0 standard drinks    Types: 5 Glasses of wine per week    Comment: occ  . Drug use: Not Currently    Review of Systems Constitutional: No fever/chills Eyes: No visual changes. ENT: No sore throat. Cardiovascular: Positive for chest pain. Respiratory: Positive for cough and dyspnea Gastrointestinal: No abdominal pain.  No nausea, no vomiting.  No diarrhea.  No constipation. Genitourinary: Negative for dysuria. Musculoskeletal: Negative for neck pain.  Negative for back pain. Integumentary: Negative for rash. Neurological: Negative for headaches, focal weakness or numbness.   ____________________________________________   PHYSICAL EXAM:  VITAL SIGNS: ED Triage Vitals  Enc Vitals Group     BP 11/06/19 0150 136/69     Pulse Rate 11/06/19 0150 (!) 110     Resp 11/06/19 0150 20     Temp 11/06/19 0150 98.4 F (36.9 C)     Temp Source 11/06/19 0150 Oral     SpO2 11/06/19 0150 95 %     Weight 11/06/19 0151 122.9 kg (271 lb)     Height 11/06/19 0151 1.676 m (5\' 6" )     Head Circumference --      Peak Flow --      Pain Score 11/06/19 0151 9     Pain Loc --      Pain Edu? --      Excl. in [place]? --     Constitutional: Alert and oriented.  Typically coughing Eyes: Conjunctivae are normal.  Mouth/Throat: Patient is wearing a mask. Neck: No stridor.  No meningeal signs.   Cardiovascular: Normal rate, regular rhythm. Good peripheral circulation. Grossly normal heart sounds. Respiratory: Diffuse rhonchi bilaterally Gastrointestinal: Soft and nontender. No distention.   Musculoskeletal: No lower extremity tenderness nor edema. No gross deformities of extremities. Neurologic:  Normal speech and language. No gross focal neurologic deficits are appreciated.  Skin:  Skin is warm, dry and intact. Psychiatric: Mood and affect are normal. Speech and behavior are normal.  ____________________________________________   LABS (all labs ordered are  listed, but only abnormal results are displayed)  Labs Reviewed  BASIC METABOLIC PANEL - Abnormal; Notable for the following components:      Result Value   CO2 18 (*)    Glucose, Bld 148 (*)    BUN 21 (*)    Creatinine, Ser 1.15 (*)  GFR calc non Af Amer 53 (*)    All other components within normal limits  CBC - Abnormal; Notable for the following components:   WBC 20.2 (*)    Hemoglobin 11.3 (*)    HCT 35.2 (*)    RDW 15.9 (*)    Platelets 467 (*)    All other components within normal limits  CULTURE, BLOOD (ROUTINE X 2)  CULTURE, BLOOD (ROUTINE X 2)  URINE CULTURE  RESPIRATORY PANEL BY RT PCR (FLU A&B, COVID)  LACTIC ACID, PLASMA  LACTIC ACID, PLASMA  APTT  PROTIME-INR  URINALYSIS, ROUTINE W REFLEX MICROSCOPIC  LACTATE DEHYDROGENASE  PROCALCITONIN  C-REACTIVE PROTEIN  FIBRINOGEN  POC URINE PREG, ED  TROPONIN I (HIGH SENSITIVITY)  TROPONIN I (HIGH SENSITIVITY)   ____________________________________________  EKG ED ECG REPORT I, [place] N Curvin Hunger, the attending physician, personally viewed and interpreted this ECG.   Date: 11/06/2019  EKG Time: 1:56 AM  Rate: 107  Rhythm: Sinus tachycardia  Axis: Normal  Intervals: Normal  ST&T Change: None    RADIOLOGY I, [place] N Kyian Obst, personally viewed and evaluated these images (plain radiographs) as part of my medical decision making, as well as reviewing the written report by the radiologist.  ED MD interpretation: Patchy and indistinct bilateral pneumonia on chest x-ray per radiologist.  Official radiology report(s): DG Chest 2 View  Result Date: 11/06/2019 CLINICAL DATA:  59 year old female with shortness of breath and chest pain. Negative for COVID-19 on 11/04/2019. EXAM: CHEST - 2 VIEW COMPARISON:  CTA chest 11/04/2019 and earlier. FINDINGS: Stable lung volumes. Mediastinal contours remain normal. Visualized tracheal air column is within normal limits. Patchy and indistinct bilateral pulmonary opacity with  basilar predominance superimposed on more chronic increased interstitial markings. No pneumothorax or pleural effusion. Basilar opacity probably is increased from the recent CTA. Negative visible bowel gas pattern. No acute osseous abnormality identified. IMPRESSION: Patchy and indistinct bilateral pneumonia with some radiographic progression suspected since the CTA on 11/04/2019. No pleural effusion. Electronically Signed   By: Genevie Ann M.D.   On: 11/06/2019 02:23    ____________________________________________   PROCEDURES     .Critical Care Performed by: Gregor Hams, MD Authorized by: Gregor Hams, MD   Critical care provider statement:    Critical care time was exclusive of:  Separately billable procedures and treating other patients   Critical care was necessary to treat or prevent imminent or life-threatening deterioration of the following conditions:  Sepsis and respiratory failure   Critical care was time spent personally by me on the following activities:  Development of treatment plan with patient or surrogate, discussions with consultants, evaluation of patient's response to treatment, examination of patient, obtaining history from patient or surrogate, ordering and performing treatments and interventions, ordering and review of laboratory studies, ordering and review of radiographic studies, pulse oximetry, re-evaluation of patient's condition and review of old charts     ____________________________________________   [place] / MDM / [place] / ED COURSE  As part of my medical decision making, I reviewed the following data within the electronic MEDICAL RECORD NUMBER   59 year old female presented with above-stated history and physical exam with differential diagnosis including but not limited to multifocal pneumonia, COVID-19.  Patient given IV ceftriaxone and azithromycin as well as Decadron 10 mg IV.  In addition patient given Tussionex secondary  to violent coughing.  Respiratory panel obtained and pending.  Patient's care discussed with Dr. Damita Dunnings hospitalist for hospital admission for further evaluation  and management.  Sepsis protocol was initiated  ____________________________________________  FINAL CLINICAL IMPRESSION(S) / ED DIAGNOSES  Final diagnoses:  Multifocal pneumonia     MEDICATIONS GIVEN DURING THIS VISIT:  Medications  cefTRIAXone (ROCEPHIN) 2 g in sodium chloride 0.9 % 100 mL IVPB (2 g Intravenous New Bag/Given 11/06/19 0446)  azithromycin (ZITHROMAX) 500 mg in sodium chloride 0.9 % 250 mL IVPB (has no administration in time range)  dexamethasone (DECADRON) injection 10 mg (10 mg Intravenous Given 11/06/19 0436)  chlorpheniramine-HYDROcodone (TUSSIONEX) 10-8 MG/5ML suspension 5 mL (5 mLs Oral Given 11/06/19 0435)     ED Discharge Orders    None      *Please note:  Melanie Bowen was evaluated in Emergency Department on 11/06/2019 for the symptoms described in the history of present illness. She was evaluated in the context of the global COVID-19 pandemic, which necessitated consideration that the patient might be at risk for infection with the SARS-CoV-2 virus that causes COVID-19. Institutional protocols and algorithms that pertain to the evaluation of patients at risk for COVID-19 are in a state of rapid change based on information released by regulatory bodies including the CDC and federal and state organizations. These policies and algorithms were followed during the patient's care in the ED.  Some ED evaluations and interventions may be delayed as a result of limited staffing during the pandemic.*  Note:  This document was prepared using Dragon voice recognition software and may include unintentional dictation errors.   Gregor Hams, MD 11/06/19 [phone]

## 2019-11-07 DIAGNOSIS — Z79899 Other long term (current) drug therapy: Secondary | ICD-10-CM | POA: Diagnosis not present

## 2019-11-07 DIAGNOSIS — K219 Gastro-esophageal reflux disease without esophagitis: Secondary | ICD-10-CM | POA: Diagnosis present

## 2019-11-07 DIAGNOSIS — Z96653 Presence of artificial knee joint, bilateral: Secondary | ICD-10-CM | POA: Diagnosis present

## 2019-11-07 DIAGNOSIS — J189 Pneumonia, unspecified organism: Secondary | ICD-10-CM | POA: Diagnosis present

## 2019-11-07 DIAGNOSIS — D72829 Elevated white blood cell count, unspecified: Secondary | ICD-10-CM | POA: Diagnosis present

## 2019-11-07 DIAGNOSIS — Z21 Asymptomatic human immunodeficiency virus [HIV] infection status: Secondary | ICD-10-CM | POA: Diagnosis present

## 2019-11-07 DIAGNOSIS — Z886 Allergy status to analgesic agent status: Secondary | ICD-10-CM | POA: Diagnosis not present

## 2019-11-07 DIAGNOSIS — Z7952 Long term (current) use of systemic steroids: Secondary | ICD-10-CM | POA: Diagnosis not present

## 2019-11-07 DIAGNOSIS — N179 Acute kidney failure, unspecified: Secondary | ICD-10-CM | POA: Diagnosis present

## 2019-11-07 DIAGNOSIS — F17210 Nicotine dependence, cigarettes, uncomplicated: Secondary | ICD-10-CM | POA: Diagnosis present

## 2019-11-07 DIAGNOSIS — Z885 Allergy status to narcotic agent status: Secondary | ICD-10-CM | POA: Diagnosis not present

## 2019-11-07 DIAGNOSIS — Z79891 Long term (current) use of opiate analgesic: Secondary | ICD-10-CM | POA: Diagnosis not present

## 2019-11-07 DIAGNOSIS — Y92239 Unspecified place in hospital as the place of occurrence of the external cause: Secondary | ICD-10-CM | POA: Diagnosis present

## 2019-11-07 DIAGNOSIS — Z20822 Contact with and (suspected) exposure to covid-19: Secondary | ICD-10-CM | POA: Diagnosis present

## 2019-11-07 DIAGNOSIS — Z803 Family history of malignant neoplasm of breast: Secondary | ICD-10-CM | POA: Diagnosis not present

## 2019-11-07 DIAGNOSIS — T380X5A Adverse effect of glucocorticoids and synthetic analogues, initial encounter: Secondary | ICD-10-CM | POA: Diagnosis present

## 2019-11-07 DIAGNOSIS — I10 Essential (primary) hypertension: Secondary | ICD-10-CM | POA: Diagnosis present

## 2019-11-07 DIAGNOSIS — Z7951 Long term (current) use of inhaled steroids: Secondary | ICD-10-CM | POA: Diagnosis not present

## 2019-11-07 DIAGNOSIS — Z888 Allergy status to other drugs, medicaments and biological substances status: Secondary | ICD-10-CM | POA: Diagnosis not present

## 2019-11-07 DIAGNOSIS — J440 Chronic obstructive pulmonary disease with acute lower respiratory infection: Secondary | ICD-10-CM | POA: Diagnosis present

## 2019-11-07 LAB — RESPIRATORY PANEL BY PCR

## 2019-11-07 LAB — T-HELPER CELLS CD4/CD8 %
% CD 4 Pos. Lymph.: 47 % (ref 30.8–58.5)
Absolute CD 4 Helper: 329 /uL — ABNORMAL LOW (ref 359–1519)
Basophils Absolute: 0 10*3/uL (ref 0.0–0.2)
Basos: 0 %
CD3+CD4+ Cells/CD3+CD8+ Cells Bld: 1.32 (ref 0.92–3.72)
CD3+CD8+ Cells # Bld: 250 /uL (ref 109–897)
CD3+CD8+ Cells NFr Bld: 35.7 % — ABNORMAL HIGH (ref 12.0–35.5)
EOS (ABSOLUTE): 0 10*3/uL (ref 0.0–0.4)
Eos: 0 %
Hematocrit: 35.4 % (ref 34.0–46.6)
Hemoglobin: 11.2 g/dL (ref 11.1–15.9)
Immature Grans (Abs): 0.1 10*3/uL (ref 0.0–0.1)
Immature Granulocytes: 1 %
Lymphocytes Absolute: 0.7 10*3/uL (ref 0.7–3.1)
Lymphs: 4 %
MCH: 27.5 pg (ref 26.6–33.0)
MCHC: 31.6 g/dL (ref 31.5–35.7)
MCV: 87 fL (ref 79–97)
Monocytes Absolute: 0.6 10*3/uL (ref 0.1–0.9)
Monocytes: 3 %
Neutrophils Absolute: 17.4 10*3/uL — ABNORMAL HIGH (ref 1.4–7.0)
Neutrophils: 92 %
Platelets: 556 10*3/uL — ABNORMAL HIGH (ref 150–450)
RBC: 4.07 x10E6/uL (ref 3.77–5.28)
RDW: 14.9 % (ref 11.7–15.4)
WBC: 18.8 10*3/uL — ABNORMAL HIGH (ref 3.4–10.8)

## 2019-11-07 LAB — CBC
HCT: 33.2 % — ABNORMAL LOW (ref 36.0–46.0)
Hemoglobin: 10.4 g/dL — ABNORMAL LOW (ref 12.0–15.0)
MCH: 27.5 pg (ref 26.0–34.0)
MCHC: 31.3 g/dL (ref 30.0–36.0)
MCV: 87.8 fL (ref 80.0–100.0)
Platelets: 456 10*3/uL — ABNORMAL HIGH (ref 150–400)
RBC: 3.78 MIL/uL — ABNORMAL LOW (ref 3.87–5.11)
RDW: 15.9 % — ABNORMAL HIGH (ref 11.5–15.5)
WBC: 18.7 10*3/uL — ABNORMAL HIGH (ref 4.0–10.5)
nRBC: 0.2 % (ref 0.0–0.2)

## 2019-11-07 LAB — BASIC METABOLIC PANEL
Anion gap: 7 (ref 5–15)
BUN: 21 mg/dL — ABNORMAL HIGH (ref 6–20)
CO2: 23 mmol/L (ref 22–32)
Calcium: 8.2 mg/dL — ABNORMAL LOW (ref 8.9–10.3)
Chloride: 109 mmol/L (ref 98–111)
Creatinine, Ser: 0.94 mg/dL (ref 0.44–1.00)
GFR calc Af Amer: >60 mL/min (ref >60)
GFR calc non Af Amer: >60 mL/min (ref >60)
Glucose, Bld: 124 mg/dL — ABNORMAL HIGH (ref 70–99)
Potassium: 4.4 mmol/L (ref 3.5–5.1)
Sodium: 139 mmol/L (ref 135–145)

## 2019-11-07 LAB — HIV-1 RNA QUANT-NO REFLEX-BLD
HIV 1 RNA Quant: 30 copies/mL
LOG10 HIV-1 RNA: 1.477 log10copy/mL

## 2019-11-07 LAB — PROCALCITONIN: Procalcitonin: <0.1 ng/mL

## 2019-11-07 MED ORDER — ALUM & MAG HYDROXIDE-SIMETH 200-200-20 MG/5ML PO SUSP
30.0000 mL | Freq: Four times a day (QID) | ORAL | Status: DC | PRN
  Administered 2019-11-07: 30 mL via ORAL
  Filled 2019-11-07: qty 30

## 2019-11-07 MED ORDER — ELVITEG-COBIC-EMTRICIT-TENOFAF 150-150-200-10 MG PO TABS
1.0000 | ORAL_TABLET | Freq: Every day | ORAL | Status: DC
  Filled 2019-11-07: qty 1

## 2019-11-07 MED ORDER — ELVITEG-COBIC-EMTRICIT-TENOFAF 150-150-200-10 MG PO TABS
1.0000 | ORAL_TABLET | Freq: Every day | ORAL | Status: DC
  Administered 2019-11-07: 1 via ORAL
  Filled 2019-11-07 (×2): qty 1

## 2019-11-07 MED ORDER — SENNOSIDES-DOCUSATE SODIUM 8.6-50 MG PO TABS
2.0000 | ORAL_TABLET | Freq: Every day | ORAL | Status: DC
  Administered 2019-11-07: 2 via ORAL
  Filled 2019-11-07: qty 2

## 2019-11-07 NOTE — Progress Notes (Signed)
[facility] INFECTIOUS DISEASE PROGRESS NOTE Date of Admission:  11/06/2019     ID: Melanie Bowen is a 59 y.o. female with PNA, HIV Principal Problem:   Multifocal pneumonia Active Problems:   Asthma, chronic   Cough   HTN (hypertension)   HIV (human immunodeficiency virus infection) (HCC)   Pneumonia   AKI (acute kidney injury) ([place])   Leukocytosis   Subjective: No fevers, wbc down to 18.   [place] neg. CPp 3.1. PC < 0.1 on repeat. CD4 329.  ROS  Eleven systems are reviewed and negative except per hpi  Medications:  Antibiotics Given (last 72 hours)    Date/Time Action Medication Dose Rate   11/06/19 0446 New Bag/Given   cefTRIAXone (ROCEPHIN) 2 g in sodium chloride 0.9 % 100 mL IVPB 2 g 200 mL/hr   11/06/19 0603 New Bag/Given   azithromycin (ZITHROMAX) 500 mg in sodium chloride 0.9 % 250 mL IVPB 500 mg 250 mL/hr   11/06/19 2026 Given   valACYclovir (VALTREX) tablet 1,000 mg 1,000 mg    11/06/19 2027 Given   elvitegravir-cobicistat-emtricitabine-tenofovir (GENVOYA) 150-150-200-10 MG tablet 1 tablet 1 tablet    11/07/19 0603 New Bag/Given   cefTRIAXone (ROCEPHIN) 2 g in sodium chloride 0.9 % 100 mL IVPB 2 g 200 mL/hr   11/07/19 0649 New Bag/Given   azithromycin (ZITHROMAX) 500 mg in sodium chloride 0.9 % 250 mL IVPB 500 mg 250 mL/hr     . amLODipine  10 mg Oral Daily  . elvitegravir-cobicistat-emtricitabine-tenofovir  1 tablet Oral Q supper  . enoxaparin (LOVENOX) injection  40 mg Subcutaneous Q12H  . hydrochlorothiazide  25 mg Oral Daily  . losartan  50 mg Oral Daily  . mometasone-formoterol  2 puff Inhalation BID  . pantoprazole  40 mg Oral Daily  . predniSONE  60 mg Oral Q breakfast  . valACYclovir  1,000 mg Oral QHS    Objective: Vital signs in last 24 hours: Temp:  [97.8 F (36.6 C)-99 F (37.2 C)] 98.6 F (37 C) (03/10 1127) Pulse Rate:  [73-82] 82 (03/10 1127) Resp:  [19-20] 19 (03/10 1231) BP: (112-125)/(56-70) 118/70 (03/10 1127) SpO2:  [94 %-97 %]  96 % (03/10 1127) Constitutional:  oriented to person, place, and time. appears well-developed and well-nourished. No distress.  HENT: [place]/AT, PERRLA, no scleral icterus Mouth/Throat: Oropharynx is clear and moist. No oropharyngeal exudate.  Cardiovascular: Normal rate, regular rhythm and normal heart sounds. Exam reveals no gallop and no friction rub.  No murmur heard.  Pulmonary/Chest: Effort normal and breath sounds normal. No respiratory distress.  has no wheezes.  Neck = supple, no nuchal rigidity Abdominal: Soft. Bowel sounds are normal.  exhibits no distension. There is no tenderness.  Lymphadenopathy: no cervical adenopathy. No axillary adenopathy Neurological: alert and oriented to person, place, and time.  Skin: Skin is warm and dry. No rash noted. No erythema.  Psychiatric: a normal mood and affect.  behavior is normal.    Lab Results Recent Labs    11/06/19 0156 11/06/19 1514 11/07/19 0458  WBC 20.2* 18.8* 18.7*  HGB 11.3* 11.2 10.4*  HCT 35.2* 35.4 33.2*  NA 136  --  139  K 3.7  --  4.4  CL 106  --  109  CO2 18*  --  23  BUN 21*  --  21*  CREATININE 1.15*  --  0.94    Microbiology: Results for orders placed or performed during the hospital encounter of 11/06/19  Blood Culture (routine x 2)  Status: None (Preliminary result)   Collection Time: 11/06/19  4:31 AM   Specimen: BLOOD  Result Value Ref Range Status   Specimen Description BLOOD LEFT ANTECUBITAL  Final   Special Requests   Final    BOTTLES DRAWN AEROBIC AND ANAEROBIC Blood Culture adequate volume   Culture   Final    NO GROWTH 1 DAY Performed at [facility], [address], [place], [place] [address]    Report Status PENDING  Incomplete  Blood Culture (routine x 2)     Status: None (Preliminary result)   Collection Time: 11/06/19  4:31 AM   Specimen: BLOOD  Result Value Ref Range Status   Specimen Description BLOOD RIGHT ANTECUBITAL  Final   Special Requests   Final    BOTTLES  DRAWN AEROBIC AND ANAEROBIC Blood Culture adequate volume   Culture   Final    NO GROWTH 1 DAY Performed at [facility], [address], [place], [place] [address]    Report Status PENDING  Incomplete  Respiratory Panel by RT PCR (Flu A&B, Covid) - Nasopharyngeal Swab     Status: None   Collection Time: 11/06/19  4:31 AM   Specimen: Nasopharyngeal Swab  Result Value Ref Range Status   SARS Coronavirus 2 by RT PCR NEGATIVE NEGATIVE Final    Comment: (NOTE) SARS-CoV-2 target nucleic acids are NOT DETECTED. The SARS-CoV-2 RNA is generally detectable in upper respiratoy specimens during the acute phase of infection. The lowest concentration of SARS-CoV-2 viral copies this assay can detect is 131 copies/mL. A negative result does not preclude SARS-Cov-2 infection and should not be used as the sole basis for treatment or other patient management decisions. A negative result may occur with  improper specimen collection/handling, submission of specimen other than nasopharyngeal swab, presence of viral mutation(s) within the areas targeted by this assay, and inadequate number of viral copies (<131 copies/mL). A negative result must be combined with clinical observations, patient history, and epidemiological information. The expected result is Negative. Fact Sheet for Patients:  PinkCheek.be Fact Sheet for Healthcare Providers:  GravelBags.it This test is not yet ap proved or cleared by the [place] FDA and  has been authorized for detection and/or diagnosis of SARS-CoV-2 by FDA under an Emergency Use Authorization (EUA). This EUA will remain  in effect (meaning this test can be used) for the duration of the COVID-19 declaration under Section 564(b)(1) of the Act, 21 U.S.C. section 360bbb-3(b)(1), unless the authorization is terminated or revoked sooner.    Influenza A by PCR NEGATIVE NEGATIVE Final   Influenza B by  PCR NEGATIVE NEGATIVE Final    Comment: (NOTE) The Xpert Xpress SARS-CoV-2/FLU/RSV assay is intended as an aid in  the diagnosis of influenza from Nasopharyngeal swab specimens and  should not be used as a sole basis for treatment. Nasal washings and  aspirates are unacceptable for Xpert Xpress SARS-CoV-2/FLU/RSV  testing. Fact Sheet for Patients: PinkCheek.be Fact Sheet for Healthcare Providers: GravelBags.it This test is not yet approved or cleared by the [place] FDA and  has been authorized for detection and/or diagnosis of SARS-CoV-2 by  FDA under an Emergency Use Authorization (EUA). This EUA will remain  in effect (meaning this test can be used) for the duration of the  Covid-19 declaration under Section 564(b)(1) of the Act, 21  U.S.C. section 360bbb-3(b)(1), unless the authorization is  terminated or revoked. Performed at [facility], [address] [place], [place] [address]  Studies/Results: DG Chest 2 View  Result Date: 11/06/2019 CLINICAL DATA:  59 year old female with shortness of breath and chest pain. Negative for COVID-19 on 11/04/2019. EXAM: CHEST - 2 VIEW COMPARISON:  CTA chest 11/04/2019 and earlier. FINDINGS: Stable lung volumes. Mediastinal contours remain normal. Visualized tracheal air column is within normal limits. Patchy and indistinct bilateral pulmonary opacity with basilar predominance superimposed on more chronic increased interstitial markings. No pneumothorax or pleural effusion. Basilar opacity probably is increased from the recent CTA. Negative visible bowel gas pattern. No acute osseous abnormality identified. IMPRESSION: Patchy and indistinct bilateral pneumonia with some radiographic progression suspected since the CTA on 11/04/2019. No pleural effusion. Electronically Signed   By: Genevie Ann M.D.   On: 11/06/2019 02:23    Assessment/Plan: KATHLYNE LOUD is a 59 y.o. female  with very well controlled, long standing HIV (CD4 > 100, VL < 20), intermittent tobacco abuse, COPD, admit with one week - ten days of cough, sob after smoking heavily for last few weeks. Reports frequent episodes like this yearly. Has lots of covid exposure at work at [employer] but has been tested many time and negative. Has had her second covid shot last week. Since CD4 > 1000 no risk opportunistic infections like PCP. Would treat for atypical PNA and with steroids and follow.  3/10- doing better. No fevers, but stillon 2 L O2 and desats with walking. PC neg. RVP pendign. Recommendations Ceftriaxone and azitrho  Pred 60 mg po QD - wean as she improves Discussed smoking cessation.  Will follow.  Thank you very much for the consult. Will follow with you.  Leonel Ramsay   11/07/2019, 3:04 PM

## 2019-11-07 NOTE — Care Management Obs Status (Signed)
[facility] NOTIFICATION   Patient Details  Name: Melanie Bowen MRN: [id number] Date of Birth: [birth year]   Medicare Observation Status Notification Given:  Yes(Patient on isolation precautions. Nurse will give to patient next time he goes in the room.)    Candie Chroman, LCSW 11/07/2019, 8:36 AM

## 2019-11-07 NOTE — Progress Notes (Addendum)
PROGRESS NOTE    Melanie Bowen  EHU:[id number] DOB: 07-11-1961 DOA: 11/06/2019 PCP: Theotis Burrow, MD  Brief Narrative:  59 yr old woman who has Azucena Dart past medical history significant for HTN, asthma, HIV positive with undetectable viral load per patient. She presented to the ED with complaints of cough and shortness of breath. She was seen in the ED for the same issue 2 days ago and was sent home on steroid taper.  She was seen again prior to admission and started on abx for CAP and given Dottie Vaquerano dose of steroids with concern for COVID.  She's tested negative for COVID on 3/7 and 3/9.  Currently being treated for CAP.  ID is following.  Assessment & Plan:   Principal Problem:   Multifocal pneumonia Active Problems:   Asthma, chronic   Cough   HTN (hypertension)   HIV (human immunodeficiency virus infection) ([place])   AKI (acute kidney injury) ([place])   Leukocytosis  Community Acquired Pneumonia:  Continues to required 2 L [place] this AM, weaned to RA this morning, but desaturates with activity with dyspnea on exertion Continues ceftriaxone/azithromycin  Negative flu, COVID testing Follow [facility] (add on testing) CXR 3/9 with patchy and indistinct bilateral pneumonia Appreciate ID recs - ceftriaxone, azithromycin, prednisone.    HIV: genvoya.    HTN: amlodipine, losartan, HCTZ  GERD: protonix  DVT prophylaxis: lovenox Code Status: full  Family Communication: none at bedside - offered to update, but pt has already updated Disposition Plan:  . Patient came from: home            . Anticipated d/c place: home . Barriers to d/c OR conditions which need to be met to effect Miachel Nardelli safe d/c: continued hypoxia with exertion and persistent O2 requirement, discharge pending further improvement in hypoxia, continued need for IV abx  Consultants:   none  Procedures:   none  Antimicrobials:  Anti-infectives (From admission, onward)   Start     Dose/Rate Route Frequency Ordered Stop    11/07/19 1700  elvitegravir-cobicistat-emtricitabine-tenofovir (GENVOYA) 150-150-200-10 MG tablet 1 tablet     1 tablet Oral Daily with supper 11/07/19 0950     11/07/19 0500  cefTRIAXone (ROCEPHIN) 2 g in sodium chloride 0.9 % 100 mL IVPB     2 g 200 mL/hr over 30 Minutes Intravenous Every 24 hours 11/06/19 0500 11/12/19 0459   11/06/19 2200  elvitegravir-cobicistat-emtricitabine-tenofovir (GENVOYA) 150-150-200-10 MG tablet 1 tablet  Status:  Discontinued     1 tablet Oral Daily at bedtime 11/06/19 1440 11/07/19 0950   11/06/19 2200  valACYclovir (VALTREX) tablet 1,000 mg     1,000 mg Oral Daily at bedtime 11/06/19 1440     11/06/19 0515  azithromycin (ZITHROMAX) 500 mg in sodium chloride 0.9 % 250 mL IVPB     500 mg 250 mL/hr over 60 Minutes Intravenous Every 24 hours 11/06/19 0500 11/11/19 0514   11/06/19 0415  cefTRIAXone (ROCEPHIN) 2 g in sodium chloride 0.9 % 100 mL IVPB  Status:  Discontinued     2 g 200 mL/hr over 30 Minutes Intravenous Every 24 hours 11/06/19 0413 11/06/19 0516   11/06/19 0415  azithromycin (ZITHROMAX) 500 mg in sodium chloride 0.9 % 250 mL IVPB  Status:  Discontinued     500 mg 250 mL/hr over 60 Minutes Intravenous Every 24 hours 11/06/19 0413 11/06/19 0516      Subjective: Still SOB with exertion Does not feel back to her baseline yet  Objective: Vitals:   11/06/19  2131 11/07/19 0613 11/07/19 1127 11/07/19 1231  BP: (!) 112/56 125/69 118/70   Pulse: 78 73 82   Resp: '20 20  19  '$ Temp: 99 F (37.2 C) 97.8 F (36.6 C) 98.6 F (37 C)   TempSrc: Oral Oral Oral   SpO2: 94% 97% 96%   Weight:      Height:        Intake/Output Summary (Last 24 hours) at 11/07/2019 1329 Last data filed at 11/07/2019 1128 Gross per 24 hour  Intake 722.37 ml  Output 500 ml  Net 222.37 ml   Filed Weights   11/06/19 0151  Weight: 122.9 kg    Examination:  General exam: Appears calm and comfortable  Respiratory system: Clear to auscultation. Respiratory effort  normal. Cardiovascular system: S1 & S2 heard, RRR.  Gastrointestinal system: Abdomen is nondistended, soft and nontender. Central nervous system: Alert and oriented. No focal neurological deficits. Extremities: no LEE Skin: No rashes, lesions or ulcers Psychiatry: Judgement and insight appear normal. Mood & affect appropriate.     Data Reviewed: I have personally reviewed following labs and imaging studies  CBC: Recent Labs  Lab 11/04/19 0821 11/06/19 0156 11/07/19 0458  WBC 6.5 20.2* 18.7*  NEUTROABS 2.9  --   --   HGB 12.1 11.3* 10.4*  HCT 38.0 35.2* 33.2*  MCV 87.6 85.2 87.8  PLT 508* 467* 440*   Basic Metabolic Panel: Recent Labs  Lab 11/04/19 0821 11/06/19 0156 11/07/19 0458  NA 138 136 139  K 3.5 3.7 4.4  CL 101 106 109  CO2 24 18* 23  GLUCOSE 92 148* 124*  BUN 10 21* 21*  CREATININE 1.02* 1.15* 0.94  CALCIUM 9.2 9.1 8.2*   GFR: Estimated Creatinine Clearance: 88.3 mL/min (by C-G formula based on SCr of 0.94 mg/dL). Liver Function Tests: Recent Labs  Lab 11/04/19 0821  AST 19  ALT 13  ALKPHOS 99  BILITOT 0.4  PROT 7.4  ALBUMIN 3.5   No results for input(s): LIPASE, AMYLASE in the last 168 hours. No results for input(s): AMMONIA in the last 168 hours. Coagulation Profile: Recent Labs  Lab 11/06/19 0432  INR 1.1   Cardiac Enzymes: No results for input(s): CKTOTAL, CKMB, CKMBINDEX, TROPONINI in the last 168 hours. BNP (last 3 results) No results for input(s): PROBNP in the last 8760 hours. HbA1C: No results for input(s): HGBA1C in the last 72 hours. CBG: No results for input(s): GLUCAP in the last 168 hours. Lipid Profile: No results for input(s): CHOL, HDL, LDLCALC, TRIG, CHOLHDL, LDLDIRECT in the last 72 hours. Thyroid Function Tests: No results for input(s): TSH, T4TOTAL, FREET4, T3FREE, THYROIDAB in the last 72 hours. Anemia Panel: No results for input(s): VITAMINB12, FOLATE, FERRITIN, TIBC, IRON, RETICCTPCT in the last 72  hours. Sepsis Labs: Recent Labs  Lab 11/04/19 0821 11/06/19 0431 11/06/19 0632 11/07/19 0458  PROCALCITON <0.10 <0.10  --  <0.10  LATICACIDVEN  --  3.4* 2.8*  --     Recent Results (from the past 240 hour(s))  SARS CORONAVIRUS 2 (TAT 6-24 HRS) Nasopharyngeal Nasopharyngeal Swab     Status: None   Collection Time: 11/04/19 11:17 AM   Specimen: Nasopharyngeal Swab  Result Value Ref Range Status   SARS Coronavirus 2 NEGATIVE NEGATIVE Final    Comment: (NOTE) SARS-CoV-2 target nucleic acids are NOT DETECTED. The SARS-CoV-2 RNA is generally detectable in upper and lower respiratory specimens during the acute phase of infection. Negative results do not preclude SARS-CoV-2 infection, do not rule out  co-infections with other pathogens, and should not be used as the sole basis for treatment or other patient management decisions. Negative results must be combined with clinical observations, patient history, and epidemiological information. The expected result is Negative. Fact Sheet for Patients: SugarRoll.be Fact Sheet for Healthcare Providers: https://www.woods-mathews.com/ This test is not yet approved or cleared by the [place] FDA and  has been authorized for detection and/or diagnosis of SARS-CoV-2 by FDA under an Emergency Use Authorization (EUA). This EUA will remain  in effect (meaning this test can be used) for the duration of the COVID-19 declaration under Section 56 4(b)(1) of the Act, 21 U.S.C. section 360bbb-3(b)(1), unless the authorization is terminated or revoked sooner. Performed at [facility], [address], [place], [place] [address]   Blood Culture (routine x 2)     Status: None (Preliminary result)   Collection Time: 11/06/19  4:31 AM   Specimen: BLOOD  Result Value Ref Range Status   Specimen Description BLOOD LEFT ANTECUBITAL  Final   Special Requests   Final    BOTTLES DRAWN AEROBIC AND ANAEROBIC  Blood Culture adequate volume   Culture   Final    NO GROWTH 1 DAY Performed at [facility], [address] [place], [place] [address]    Report Status PENDING  Incomplete  Blood Culture (routine x 2)     Status: None (Preliminary result)   Collection Time: 11/06/19  4:31 AM   Specimen: BLOOD  Result Value Ref Range Status   Specimen Description BLOOD RIGHT ANTECUBITAL  Final   Special Requests   Final    BOTTLES DRAWN AEROBIC AND ANAEROBIC Blood Culture adequate volume   Culture   Final    NO GROWTH 1 DAY Performed at [facility], [address], [place], [place] [address]    Report Status PENDING  Incomplete  Respiratory Panel by RT PCR (Flu Rahel Carlton&B, Covid) - Nasopharyngeal Swab     Status: None   Collection Time: 11/06/19  4:31 AM   Specimen: Nasopharyngeal Swab  Result Value Ref Range Status   SARS Coronavirus 2 by RT PCR NEGATIVE NEGATIVE Final    Comment: (NOTE) SARS-CoV-2 target nucleic acids are NOT DETECTED. The SARS-CoV-2 RNA is generally detectable in upper respiratoy specimens during the acute phase of infection. The lowest concentration of SARS-CoV-2 viral copies this assay can detect is 131 copies/mL. Roy Snuffer negative result does not preclude SARS-Cov-2 infection and should not be used as the sole basis for treatment or other patient management decisions. Jailin Manocchio negative result may occur with  improper specimen collection/handling, submission of specimen other than nasopharyngeal swab, presence of viral mutation(s) within the areas targeted by this assay, and inadequate number of viral copies (<131 copies/mL). Cythia Bachtel negative result must be combined with clinical observations, patient history, and epidemiological information. The expected result is Negative. Fact Sheet for Patients:  PinkCheek.be Fact Sheet for Healthcare Providers:  GravelBags.it This test is not yet ap proved or cleared by the [place] FDA and  has been authorized for detection and/or diagnosis of SARS-CoV-2 by FDA under an Emergency Use Authorization (EUA). This EUA will remain  in effect (meaning this test can be used) for the duration of the COVID-19 declaration under Section 564(b)(1) of the Act, 21 U.S.C. section 360bbb-3(b)(1), unless the authorization is terminated or revoked sooner.    Influenza Kenzy Campoverde by PCR NEGATIVE NEGATIVE Final   Influenza B by PCR NEGATIVE NEGATIVE Final    Comment: (NOTE) The  Xpert Xpress SARS-CoV-2/FLU/RSV assay is intended as an aid in  the diagnosis of influenza from Nasopharyngeal swab specimens and  should not be used as Nicolette Gieske sole basis for treatment. Nasal washings and  aspirates are unacceptable for Xpert Xpress SARS-CoV-2/FLU/RSV  testing. Fact Sheet for Patients: PinkCheek.be Fact Sheet for Healthcare Providers: GravelBags.it This test is not yet approved or cleared by the [place] FDA and  has been authorized for detection and/or diagnosis of SARS-CoV-2 by  FDA under an Emergency Use Authorization (EUA). This EUA will remain  in effect (meaning this test can be used) for the duration of the  Covid-19 declaration under Section 564(b)(1) of the Act, 21  U.S.C. section 360bbb-3(b)(1), unless the authorization is  terminated or revoked. Performed at [facility], [address], [place], [place] [address]          Radiology Studies: DG Chest 2 View  Result Date: 11/06/2019 CLINICAL DATA:  59 year old female with shortness of breath and chest pain. Negative for COVID-19 on 11/04/2019. EXAM: CHEST - 2 VIEW COMPARISON:  CTA chest 11/04/2019 and earlier. FINDINGS: Stable lung volumes. Mediastinal contours remain normal. Visualized tracheal air column is within normal limits. Patchy and indistinct bilateral pulmonary opacity with basilar predominance superimposed on more chronic increased interstitial  markings. No pneumothorax or pleural effusion. Basilar opacity probably is increased from the recent CTA. Negative visible bowel gas pattern. No acute osseous abnormality identified. IMPRESSION: Patchy and indistinct bilateral pneumonia with some radiographic progression suspected since the CTA on 11/04/2019. No pleural effusion. Electronically Signed   By: Genevie Ann M.D.   On: 11/06/2019 02:23        Scheduled Meds: . amLODipine  10 mg Oral Daily  . elvitegravir-cobicistat-emtricitabine-tenofovir  1 tablet Oral Q supper  . enoxaparin (LOVENOX) injection  40 mg Subcutaneous Q12H  . hydrochlorothiazide  25 mg Oral Daily  . losartan  50 mg Oral Daily  . mometasone-formoterol  2 puff Inhalation BID  . pantoprazole  40 mg Oral Daily  . predniSONE  60 mg Oral Q breakfast  . valACYclovir  1,000 mg Oral QHS   Continuous Infusions: . sodium chloride Stopped (11/07/19 1228)  . azithromycin 500 mg (11/07/19 0649)  . cefTRIAXone (ROCEPHIN)  IV 2 g (11/07/19 0603)     LOS: 0 days    Time spent: over 30 min    Fayrene Helper, MD [facility]   To contact the attending provider between 7A-7P or the covering provider during after hours 7P-7A, please log into the web site [email or web address] and access using universal [place] password for that web site. If you do not have the password, please call the hospital operator.  11/07/2019, 1:29 PM

## 2019-11-07 NOTE — Progress Notes (Signed)
SATURATION QUALIFICATIONS: (This note is used to comply with regulatory documentation for home oxygen)  Patient Saturations on Room Air at Rest = 89%  Patient Saturations on Room Air while Ambulating = 84%  Patient Saturations on 3 Liters of oxygen while Ambulating = 92%  Please briefly explain why patient needs home oxygen:

## 2019-11-08 LAB — CBC WITH DIFFERENTIAL/PLATELET
Abs Immature Granulocytes: 0.12 10*3/uL — ABNORMAL HIGH (ref 0.00–0.07)
Basophils Absolute: 0 10*3/uL (ref 0.0–0.1)
Basophils Relative: 0 %
Eosinophils Absolute: 0 10*3/uL (ref 0.0–0.5)
Eosinophils Relative: 0 %
HCT: 33.6 % — ABNORMAL LOW (ref 36.0–46.0)
Hemoglobin: 10.9 g/dL — ABNORMAL LOW (ref 12.0–15.0)
Immature Granulocytes: 1 %
Lymphocytes Relative: 9 %
Lymphs Abs: 1.6 10*3/uL (ref 0.7–4.0)
MCH: 28 pg (ref 26.0–34.0)
MCHC: 32.4 g/dL (ref 30.0–36.0)
MCV: 86.4 fL (ref 80.0–100.0)
Monocytes Absolute: 1.3 10*3/uL — ABNORMAL HIGH (ref 0.1–1.0)
Monocytes Relative: 7 %
Neutro Abs: 14.4 10*3/uL — ABNORMAL HIGH (ref 1.7–7.7)
Neutrophils Relative %: 83 %
Platelets: 495 10*3/uL — ABNORMAL HIGH (ref 150–400)
RBC: 3.89 MIL/uL (ref 3.87–5.11)
RDW: 15.7 % — ABNORMAL HIGH (ref 11.5–15.5)
WBC: 17.4 10*3/uL — ABNORMAL HIGH (ref 4.0–10.5)
nRBC: 0.2 % (ref 0.0–0.2)

## 2019-11-08 LAB — COMPREHENSIVE METABOLIC PANEL
ALT: 14 U/L (ref 0–44)
AST: 14 U/L — ABNORMAL LOW (ref 15–41)
Albumin: 2.7 g/dL — ABNORMAL LOW (ref 3.5–5.0)
Alkaline Phosphatase: 65 U/L (ref 38–126)
Anion gap: 8 (ref 5–15)
BUN: 24 mg/dL — ABNORMAL HIGH (ref 6–20)
CO2: 25 mmol/L (ref 22–32)
Calcium: 8.3 mg/dL — ABNORMAL LOW (ref 8.9–10.3)
Chloride: 106 mmol/L (ref 98–111)
Creatinine, Ser: 1 mg/dL (ref 0.44–1.00)
GFR calc Af Amer: >60 mL/min (ref >60)
GFR calc non Af Amer: >60 mL/min (ref >60)
Glucose, Bld: 111 mg/dL — ABNORMAL HIGH (ref 70–99)
Potassium: 4.1 mmol/L (ref 3.5–5.1)
Sodium: 139 mmol/L (ref 135–145)
Total Bilirubin: 0.6 mg/dL (ref 0.3–1.2)
Total Protein: 6.1 g/dL — ABNORMAL LOW (ref 6.5–8.1)

## 2019-11-08 LAB — LEGIONELLA PNEUMOPHILA SEROGP 1 UR AG: L. pneumophila Serogp 1 Ur Ag: NEGATIVE

## 2019-11-08 LAB — MAGNESIUM: Magnesium: 2.1 mg/dL (ref 1.7–2.4)

## 2019-11-08 LAB — PROCALCITONIN: Procalcitonin: <0.1 ng/mL

## 2019-11-08 LAB — FUNGITELL, SERUM: Fungitell Result: <31 pg/mL (ref <80)

## 2019-11-08 LAB — PHOSPHORUS: Phosphorus: 3 mg/dL (ref 2.5–4.6)

## 2019-11-08 MED ORDER — PREDNISONE 20 MG PO TABS: 40.0000 mg | ORAL_TABLET | Freq: Every day | ORAL | 0 refills | Status: AC

## 2019-11-08 MED ORDER — AZITHROMYCIN 500 MG PO TABS: 500.0000 mg | ORAL_TABLET | Freq: Every day | ORAL | 0 refills | Status: AC

## 2019-11-08 MED ORDER — AZITHROMYCIN 500 MG PO TABS: 500.0000 mg | ORAL_TABLET | Freq: Every day | ORAL | 0 refills | Status: DC

## 2019-11-08 MED ORDER — CEFDINIR 300 MG PO CAPS: 300.0000 mg | ORAL_CAPSULE | Freq: Two times a day (BID) | ORAL | 0 refills | Status: AC

## 2019-11-08 NOTE — Progress Notes (Signed)
SATURATION QUALIFICATIONS:  Patient Saturations on Room Air at Rest = 99%  Patient Saturations on Room Air while Ambulating = 91%  Patient Saturations immediately jumped back up to 94% as soon as she sat back down. After resting for a few seconds went back up to 99%.

## 2019-11-08 NOTE — TOC Transition Note (Signed)
Transition of Care [facility]) - CM/SW Discharge Note   Patient Details  Name: Melanie Bowen MRN: [id number] Date of Birth: 07/05/1961  Transition of Care [facility]) CM/SW Contact:  Candie Chroman, LCSW Phone Number: 11/08/2019, 9:48 AM   Clinical Narrative:  Readmission prevention screen complete. CSW called patient, introduced role, and explained that discharge planning would be discussed. Patient's PCP is Dr. Ladoris Gene at [facility]. He is currently out for 2-3 weeks. CSW called to try and set up appt with another provider at this clinic but they are booked out right now. Receptionist will call patient if there is a cancellation. Patient's PCP is [facility]. No issues obtaining medications. Patient did not have home health services prior to admission. She previously had services through [employer] (Private pay) and she said she may call them to restart services. Patient uses a cane at home. She still works and drives. Patient plans on returning home on Monday. No further concerns. Patient has orders to discharge home today. Her daughter will pick her up. CSW signing off.   Final next level of care: Home/Self Care Barriers to Discharge: Barriers Resolved   Patient Goals and CMS Choice     Choice offered to / list presented to : NA  Discharge Placement                       Discharge Plan and Services     Post Acute Care Choice: NA                               Social Determinants of Health (SDOH) Interventions     Readmission Risk Interventions Readmission Risk Prevention Plan 11/08/2019  Transportation Screening Complete  Medication Review Press photographer) Complete  PCP or Specialist appointment within 3-5 days of discharge Not Complete  PCP/Specialist Appt Not Complete comments Her PCP is out for 2-3 weeks so other two providers are booked out. The receptionist will call her if there is a cancellation.  [place] or Home Care Consult  Complete  SW Recovery Care/Counseling Consult Complete  Palliative Care Screening Not Applicable  Skilled Nursing Facility Not Applicable  Some recent data might be hidden

## 2019-11-08 NOTE — Progress Notes (Signed)
Melanie Bowen to be D/C'd home with daughter per MD order.  Discussed prescriptions and follow up appointments with the patient. Prescriptions given to patient, medication list explained in detail. Pt verbalized understanding.  Allergies as of 11/08/2019       Reactions   Acetaminophen Nausea And Vomiting   Ibuprofen Other (See Comments)   Reports causes bleeding   Gabapentin Rash   Morphine Itching, Rash   Morphine And Related Itching   Zanaflex  [tizanidine] Rash        Medication List     STOP taking these medications    butalbital-acetaminophen-caffeine 50-325-40 MG tablet Commonly known as: FIORICET   HYDROcodone-acetaminophen 5-325 MG tablet Commonly known as: Norco   traMADol 50 MG tablet Commonly known as: Ultram       TAKE these medications    albuterol 108 (90 Base) MCG/ACT inhaler Commonly known as: VENTOLIN HFA Inhale 2 puffs into the lungs every 6 (six) hours as needed for wheezing or shortness of breath.   amLODipine 10 MG tablet Commonly known as: NORVASC Take 10 mg by mouth daily.   azithromycin 500 MG tablet Commonly known as: Zithromax Take 1 tablet (500 mg total) by mouth daily for 2 days. What changed:  medication strength how much to take how to take this when to take this additional instructions   Baclofen 5 MG Tabs Take 5 mg by mouth 3 (three) times daily as needed.   cefdinir 300 MG capsule Commonly known as: OMNICEF Take 1 capsule (300 mg total) by mouth 2 (two) times daily for 2 days.   Dexilant 60 MG capsule Generic drug: dexlansoprazole Take 60 mg by mouth daily.   elvitegravir-cobicistat-emtricitabine-tenofovir 150-150-200-10 MG Tabs tablet Commonly known as: GENVOYA Take 1 tablet by mouth at bedtime.   famotidine 20 MG tablet Commonly known as: PEPCID Take 20 mg by mouth daily.   guaiFENesin-codeine 100-10 MG/5ML syrup Take 5 mLs by mouth every 6 (six) hours as needed for up to 5 days for cough.    hydrochlorothiazide 25 MG tablet Commonly known as: HYDRODIURIL Take 25 mg by mouth daily.   losartan 50 MG tablet Commonly known as: COZAAR Take 50 mg by mouth daily.   methocarbamol 500 MG tablet Commonly known as: Robaxin Take 1 tablet (500 mg total) by mouth 4 (four) times daily.   ondansetron 4 MG disintegrating tablet Commonly known as: Zofran ODT Take 1 tablet (4 mg total) by mouth every 8 (eight) hours as needed for nausea or vomiting.   polyethylene glycol 17 g packet Commonly known as: MiraLax Take 17 g by mouth daily as needed for moderate constipation.   predniSONE 20 MG tablet Commonly known as: DELTASONE Take 2 tablets (40 mg total) by mouth daily for 2 days.   valACYclovir 1000 MG tablet Commonly known as: VALTREX Take 1,000 mg by mouth at bedtime.   Wixela Inhub 250-50 MCG/DOSE Aepb Generic drug: Fluticasone-Salmeterol Inhale 1 puff into the lungs 2 (two) times daily. What changed: Another medication with the same name was removed. Continue taking this medication, and follow the directions you see here.        Vitals:   11/08/19 0633 11/08/19 0816  BP: 135/84 (!) 143/84  Pulse: 76   Resp: 20   Temp: 98.3 F (36.8 C)   SpO2: 93%     Skin clean, dry and intact without evidence of skin break down, no evidence of skin tears noted. IV catheter discontinued intact. Site without signs and symptoms of  complications. Dressing and pressure applied. Pt denies pain at this time. No complaints noted.  An After Visit Summary was printed and given to the patient. Patient escorted via [place], and D/C home via private auto.  [place] A Melanie Bowen

## 2019-11-08 NOTE — Discharge Summary (Signed)
Physician Discharge Summary  Melanie Bowen PNT:[id number] DOB: 12/08/1960 DOA: 11/06/2019  PCP: Melanie Burrow, MD  Admit date: 11/06/2019 Discharge date: 11/08/2019  Time spent: 40 minutes  Recommendations for Outpatient Follow-up:  1. Follow outpatient CBC/CMP 2. Complete course of abx and steroids 3. Follow CXR outpatient 4. Follow with outpatient PCP   Discharge Diagnoses:  Principal Problem:   Multifocal pneumonia Active Problems:   Asthma, chronic   Cough   HTN (hypertension)   HIV (human immunodeficiency virus infection) ([place])   Pneumonia   AKI (acute kidney injury) ([place])   Leukocytosis   Discharge Condition: stable  Filed Weights   11/06/19 0151  Weight: 122.9 kg    History of present illness:  59 yr old woman who has Melanie Bowen past medical history significant for HTN, asthma, HIV positive with undetectable viral load per patient. She presented to the ED with complaints of cough and shortness of breath. She was seen in the ED for the same issue 2 days ago and was sent home on steroid taper.  She was seen again prior to admission and started on abx for CAP and given Melanie Bowen dose of steroids with concern for COVID.  She's tested negative for COVID on 3/7 and 3/9.  Currently being treated for CAP.  ID is following.  She was admitted for CAP.  Negative COVID and RVP.  She's improved with steroids and antibiotics.  Discharged with plan to complete Melanie Bowen 5 day total course.  Stable on RA at discharge on 3/11.  See below for additional details  Hospital Course:  Community Acquired Pneumonia:  Improved, stable on RA at discharge Cefdinir/azithro to complete 5 day course at discharge Negative flu, COVID testing Follow RVP (negative) CXR 3/9 with patchy and indistinct bilateral pneumonia Appreciate ID recs - ceftriaxone, azithromycin, prednisone.    HIV: genvoya.    HTN: amlodipine, losartan, HCTZ  GERD: protonix   Procedures:  none    Consultations:  ID  Discharge Exam: Vitals:   11/08/19 0633 11/08/19 0816  BP: 135/84 (!) 143/84  Pulse: 76   Resp: 20   Temp: 98.3 F (36.8 C)   SpO2: 93%    Eager to discharge Feeling better  General: No acute distress. Cardiovascular: Heart sounds show Melanie Bowen regular rate, and rhythm.  Lungs: Clear to auscultation bilaterally Abdomen: Soft, nontender, nondistended  Neurological: Alert and oriented 3. Moves all extremities 4 with equal strength. Cranial nerves II through XII grossly intact. Skin: Warm and dry. No rashes or lesions. Extremities: No clubbing or cyanosis. No edema. *.  Discharge Instructions   Discharge Instructions    Call MD for:  difficulty breathing, headache or visual disturbances   Complete by: As directed    Call MD for:  extreme fatigue   Complete by: As directed    Call MD for:  hives   Complete by: As directed    Call MD for:  persistant dizziness or light-headedness   Complete by: As directed    Call MD for:  persistant nausea and vomiting   Complete by: As directed    Call MD for:  redness, tenderness, or signs of infection (pain, swelling, redness, odor or green/yellow discharge around incision site)   Complete by: As directed    Call MD for:  severe uncontrolled pain   Complete by: As directed    Call MD for:  temperature >100.4   Complete by: As directed    Diet - low sodium heart healthy   Complete by:  As directed    Discharge instructions   Complete by: As directed    You were seen for pneumonia.  You've improved with antibiotics.  We'll send you home with another 2 days of steroids and antibiotics.  Return for new, recurrent, or worsening symptoms.  Please schedule Melanie Bowen follow up appointment with Dr. Ola Bowen.  Please ask your PCP to request records from this hospitalization so they know what was done and what the next steps will be.   Increase activity slowly   Complete by: As directed      Allergies as of 11/08/2019       Reactions   Acetaminophen Nausea And Vomiting   Ibuprofen Other (See Comments)   Reports causes bleeding   Gabapentin Rash   Morphine Itching, Rash   Morphine And Related Itching   Zanaflex  [tizanidine] Rash      Medication List    STOP taking these medications   butalbital-acetaminophen-caffeine 50-325-40 MG tablet Commonly known as: FIORICET   HYDROcodone-acetaminophen 5-325 MG tablet Commonly known as: Norco   traMADol 50 MG tablet Commonly known as: Ultram     TAKE these medications   albuterol 108 (90 Base) MCG/ACT inhaler Commonly known as: VENTOLIN HFA Inhale 2 puffs into the lungs every 6 (six) hours as needed for wheezing or shortness of breath.   amLODipine 10 MG tablet Commonly known as: NORVASC Take 10 mg by mouth daily.   azithromycin 500 MG tablet Commonly known as: Zithromax Take 1 tablet (500 mg total) by mouth daily for 2 days. Take 1 tablet daily for 3 days. What changed:   medication strength  how much to take  how to take this  when to take this  additional instructions   Baclofen 5 MG Tabs Take 5 mg by mouth 3 (three) times daily as needed.   cefdinir 300 MG capsule Commonly known as: OMNICEF Take 1 capsule (300 mg total) by mouth 2 (two) times daily for 2 days.   Dexilant 60 MG capsule Generic drug: dexlansoprazole Take 60 mg by mouth daily.   elvitegravir-cobicistat-emtricitabine-tenofovir 150-150-200-10 MG Tabs tablet Commonly known as: GENVOYA Take 1 tablet by mouth at bedtime.   famotidine 20 MG tablet Commonly known as: PEPCID Take 20 mg by mouth daily.   guaiFENesin-codeine 100-10 MG/5ML syrup Take 5 mLs by mouth every 6 (six) hours as needed for up to 5 days for cough.   hydrochlorothiazide 25 MG tablet Commonly known as: HYDRODIURIL Take 25 mg by mouth daily.   losartan 50 MG tablet Commonly known as: COZAAR Take 50 mg by mouth daily.   methocarbamol 500 MG tablet Commonly known as: Robaxin Take 1  tablet (500 mg total) by mouth 4 (four) times daily.   ondansetron 4 MG disintegrating tablet Commonly known as: Zofran ODT Take 1 tablet (4 mg total) by mouth every 8 (eight) hours as needed for nausea or vomiting.   polyethylene glycol 17 g packet Commonly known as: MiraLax Take 17 g by mouth daily as needed for moderate constipation.   predniSONE 20 MG tablet Commonly known as: DELTASONE Take 2 tablets (40 mg total) by mouth daily for 2 days.   valACYclovir 1000 MG tablet Commonly known as: VALTREX Take 1,000 mg by mouth at bedtime.   Wixela Inhub 250-50 MCG/DOSE Aepb Generic drug: Fluticasone-Salmeterol Inhale 1 puff into the lungs 2 (two) times daily. What changed: Another medication with the same name was removed. Continue taking this medication, and follow the directions you see here.  Allergies  Allergen Reactions  . Acetaminophen Nausea And Vomiting  . Ibuprofen Other (See Comments)    Reports causes bleeding  . Gabapentin Rash  . Morphine Itching and Rash  . Morphine And Related Itching  . Zanaflex  [Tizanidine] Rash      The results of significant diagnostics from this hospitalization (including imaging, microbiology, ancillary and laboratory) are listed below for reference.    Significant Diagnostic Studies: DG Chest 2 View  Result Date: 11/06/2019 CLINICAL DATA:  59 year old female with shortness of breath and chest pain. Negative for COVID-19 on 11/04/2019. EXAM: CHEST - 2 VIEW COMPARISON:  CTA chest 11/04/2019 and earlier. FINDINGS: Stable lung volumes. Mediastinal contours remain normal. Visualized tracheal air column is within normal limits. Patchy and indistinct bilateral pulmonary opacity with basilar predominance superimposed on more chronic increased interstitial markings. No pneumothorax or pleural effusion. Basilar opacity probably is increased from the recent CTA. Negative visible bowel gas pattern. No acute osseous abnormality identified.  IMPRESSION: Patchy and indistinct bilateral pneumonia with some radiographic progression suspected since the CTA on 11/04/2019. No pleural effusion. Electronically Signed   By: Genevie Ann M.D.   On: 11/06/2019 02:23   CT Angio Chest PE W and/or Wo Contrast  Result Date: 11/04/2019 CLINICAL DATA:  Shortness of breath, chest pain and cough. Negative COVID-19 test. EXAM: CT ANGIOGRAPHY CHEST WITH CONTRAST TECHNIQUE: Multidetector CT imaging of the chest was performed using the standard protocol during bolus administration of intravenous contrast. Multiplanar CT image reconstructions and MIPs were obtained to evaluate the vascular anatomy. CONTRAST:  148mL OMNIPAQUE IOHEXOL 350 MG/ML SOLN COMPARISON:  05/22/2019 FINDINGS: Cardiovascular: Normally opacified pulmonary arteries with no pulmonary arterial filling defects seen. Atheromatous calcifications, including the coronary arteries and aorta. Normal sized heart. Mediastinum/Nodes: No enlarged mediastinal, hilar, or axillary lymph nodes. Thyroid gland, trachea, and esophagus demonstrate no significant findings. Lungs/Pleura: Interval multiple areas of patchy opacity in both lungs in Derrica Sieg somewhat peripheral distribution. These are involving both upper lobes, lingula and left lower lobe. This is most pronounced in the right upper lobe. No pleural fluid. Upper Abdomen: No acute abnormality. Musculoskeletal: Thoracic and lower cervical spine degenerative changes. Bilateral glenohumeral degenerative changes. Review of the MIP images confirms the above findings. IMPRESSION: 1. Interval multiple areas of patchy opacity in both lungs, most pronounced in the right upper lobe. This is compatible with multifocal pneumonia with an appearance suspicious for COVID-19 infection despite the recent negative COVID test. 2. No pulmonary emboli. 3. Calcific coronary artery and aortic atherosclerosis. Aortic Atherosclerosis (ICD10-I70.0). Electronically Signed   By: Claudie Revering M.D.   On:  11/04/2019 09:58    Microbiology: Recent Results (from the past 240 hour(s))  SARS CORONAVIRUS 2 (TAT 6-24 HRS) Nasopharyngeal Nasopharyngeal Swab     Status: None   Collection Time: 11/04/19 11:17 AM   Specimen: Nasopharyngeal Swab  Result Value Ref Range Status   SARS Coronavirus 2 NEGATIVE NEGATIVE Final    Comment: (NOTE) SARS-CoV-2 target nucleic acids are NOT DETECTED. The SARS-CoV-2 RNA is generally detectable in upper and lower respiratory specimens during the acute phase of infection. Negative results do not preclude SARS-CoV-2 infection, do not rule out co-infections with other pathogens, and should not be used as the sole basis for treatment or other patient management decisions. Negative results must be combined with clinical observations, patient history, and epidemiological information. The expected result is Negative. Fact Sheet for Patients: SugarRoll.be Fact Sheet for Healthcare Providers: https://www.woods-mathews.com/ This test is not yet approved or cleared  by the [place] and  has been authorized for detection and/or diagnosis of SARS-CoV-2 by FDA under an Emergency Use Authorization (EUA). This EUA will remain  in effect (meaning this test can be used) for the duration of the COVID-19 declaration under Section 56 4(b)(1) of the Act, 21 U.S.C. section 360bbb-3(b)(1), unless the authorization is terminated or revoked sooner. Performed at [facility], [place] [address] [place], [place] [address]   Blood Culture (routine x 2)     Status: None (Preliminary result)   Collection Time: 11/06/19  4:31 AM   Specimen: BLOOD  Result Value Ref Range Status   Specimen Description BLOOD LEFT ANTECUBITAL  Final   Special Requests   Final    BOTTLES DRAWN AEROBIC AND ANAEROBIC Blood Culture adequate volume   Culture   Final    NO GROWTH 2 DAYS Performed at [facility], [address], [place], [place]  [address]    Report Status PENDING  Incomplete  Blood Culture (routine x 2)     Status: None (Preliminary result)   Collection Time: 11/06/19  4:31 AM   Specimen: BLOOD  Result Value Ref Range Status   Specimen Description BLOOD RIGHT ANTECUBITAL  Final   Special Requests   Final    BOTTLES DRAWN AEROBIC AND ANAEROBIC Blood Culture adequate volume   Culture   Final    NO GROWTH 2 DAYS Performed at [facility], [address] [place], [place] [address]    Report Status PENDING  Incomplete  Respiratory Panel by RT PCR (Flu Coury Grieger&B, Covid) - Nasopharyngeal Swab     Status: None   Collection Time: 11/06/19  4:31 AM   Specimen: Nasopharyngeal Swab  Result Value Ref Range Status   SARS Coronavirus 2 by RT PCR NEGATIVE NEGATIVE Final    Comment: (NOTE) SARS-CoV-2 target nucleic acids are NOT DETECTED. The SARS-CoV-2 RNA is generally detectable in upper respiratoy specimens during the acute phase of infection. The lowest concentration of SARS-CoV-2 viral copies this assay can detect is 131 copies/mL. Aaliah Jorgenson negative result does not preclude SARS-Cov-2 infection and should not be used as the sole basis for treatment or other patient management decisions. Linsie Lupo negative result may occur with  improper specimen collection/handling, submission of specimen other than nasopharyngeal swab, presence of viral mutation(s) within the areas targeted by this assay, and inadequate number of viral copies (<131 copies/mL). Tyreque Finken negative result must be combined with clinical observations, patient history, and epidemiological information. The expected result is Negative. Fact Sheet for Patients:  PinkCheek.be Fact Sheet for Healthcare Providers:  GravelBags.it This test is not yet ap proved or cleared by the [place] FDA and  has been authorized for detection and/or diagnosis of SARS-CoV-2 by FDA under an Emergency Use Authorization (EUA). This EUA  will remain  in effect (meaning this test can be used) for the duration of the COVID-19 declaration under Section 564(b)(1) of the Act, 21 U.S.C. section 360bbb-3(b)(1), unless the authorization is terminated or revoked sooner.    Influenza Markeise Mathews by PCR NEGATIVE NEGATIVE Final   Influenza B by PCR NEGATIVE NEGATIVE Final    Comment: (NOTE) The Xpert Xpress SARS-CoV-2/FLU/RSV assay is intended as an aid in  the diagnosis of influenza from Nasopharyngeal swab specimens and  should not be used as Deaglan Lile sole basis for treatment. Nasal washings and  aspirates are unacceptable for Xpert Xpress SARS-CoV-2/FLU/RSV  testing. Fact Sheet for Patients: PinkCheek.be Fact Sheet for Healthcare Providers: GravelBags.it This test  is not yet approved or cleared by the [place] and  has been authorized for detection and/or diagnosis of SARS-CoV-2 by  FDA under an Emergency Use Authorization (EUA). This EUA will remain  in effect (meaning this test can be used) for the duration of the  Covid-19 declaration under Section 564(b)(1) of the Act, 21  U.S.C. section 360bbb-3(b)(1), unless the authorization is  terminated or revoked. Performed at [facility], [address] [place], [place] [address]   Respiratory Panel by PCR     Status: None   Collection Time: 11/07/19  4:31 AM   Specimen: Nasopharyngeal Swab; Respiratory  Result Value Ref Range Status   Adenovirus NOT DETECTED NOT DETECTED Final   Coronavirus 229E NOT DETECTED NOT DETECTED Final    Comment: (NOTE) The Coronavirus on the Respiratory Panel, DOES NOT test for the novel  Coronavirus (2019 nCoV)    Coronavirus HKU1 NOT DETECTED NOT DETECTED Final   Coronavirus NL63 NOT DETECTED NOT DETECTED Final   Coronavirus OC43 NOT DETECTED NOT DETECTED Final   Metapneumovirus NOT DETECTED NOT DETECTED Final   Rhinovirus / Enterovirus NOT DETECTED NOT DETECTED Final   Influenza Lonnette Shrode  NOT DETECTED NOT DETECTED Final   Influenza B NOT DETECTED NOT DETECTED Final   Parainfluenza Virus 1 NOT DETECTED NOT DETECTED Final   Parainfluenza Virus 2 NOT DETECTED NOT DETECTED Final   Parainfluenza Virus 3 NOT DETECTED NOT DETECTED Final   Parainfluenza Virus 4 NOT DETECTED NOT DETECTED Final   Respiratory Syncytial Virus NOT DETECTED NOT DETECTED Final   Bordetella pertussis NOT DETECTED NOT DETECTED Final   Chlamydophila pneumoniae NOT DETECTED NOT DETECTED Final   Mycoplasma pneumoniae NOT DETECTED NOT DETECTED Final    Comment: Performed at [facility], [place]. [address] [place], [place] [address]     Labs: Basic Metabolic Panel: Recent Labs  Lab 11/04/19 [phone] 11/06/19 0156 11/07/19 0458 11/08/19 0323  NA 138 136 139 139  K 3.5 3.7 4.4 4.1  CL 101 106 109 106  CO2 24 18* 23 25  GLUCOSE 92 148* 124* 111*  BUN 10 21* 21* 24*  CREATININE 1.02* 1.15* 0.94 1.00  CALCIUM 9.2 9.1 8.2* 8.3*  MG  --   --   --  2.1  PHOS  --   --   --  3.0   Liver Function Tests: Recent Labs  Lab 11/04/19 0821 11/08/19 0323  AST 19 14*  ALT 13 14  ALKPHOS 99 65  BILITOT 0.4 0.6  PROT 7.4 6.1*  ALBUMIN 3.5 2.7*   No results for input(s): LIPASE, AMYLASE in the last 168 hours. No results for input(s): AMMONIA in the last 168 hours. CBC: Recent Labs  Lab 11/04/19 0821 11/06/19 0156 11/06/19 1514 11/07/19 0458 11/08/19 0323  WBC 6.5 20.2* 18.8* 18.7* 17.4*  NEUTROABS 2.9  --  17.4*  --  14.4*  HGB 12.1 11.3* 11.2 10.4* 10.9*  HCT 38.0 35.2* 35.4 33.2* 33.6*  MCV 87.6 85.2 87 87.8 86.4  PLT 508* 467* 556* 456* 495*   Cardiac Enzymes: No results for input(s): CKTOTAL, CKMB, CKMBINDEX, TROPONINI in the last 168 hours. BNP: BNP (last 3 results) Recent Labs    05/24/19 1602 11/04/19 0821  BNP 55.0 28.0    ProBNP (last 3 results) No results for input(s): PROBNP in the last 8760 hours.  CBG: No results for input(s): GLUCAP in the last 168  hours.     Signed:  Fayrene Helper MD.  [facility] 11/08/2019, 9:22 AM

## 2019-11-09 LAB — ASPERGILLUS ANTIGEN, BAL/SERUM: Aspergillus Ag, BAL/Serum: 0.07 Index (ref 0.00–0.49)

## 2019-11-11 LAB — CULTURE, BLOOD (ROUTINE X 2)
Culture: NO GROWTH
Culture: NO GROWTH
Special Requests: ADEQUATE
Special Requests: ADEQUATE

## 2019-12-09 ENCOUNTER — Emergency Department
Admission: EM | Admit: 2019-12-09 | Discharge: 2019-12-09 | Disposition: A | Discharge status: Home / Self Care | Payer: Medicare Other | Attending: Emergency Medicine | Admitting: Emergency Medicine

## 2019-12-09 ENCOUNTER — Other Ambulatory Visit: Payer: Self-pay

## 2019-12-09 DIAGNOSIS — F17210 Nicotine dependence, cigarettes, uncomplicated: Secondary | ICD-10-CM | POA: Diagnosis not present

## 2019-12-09 DIAGNOSIS — I10 Essential (primary) hypertension: Secondary | ICD-10-CM | POA: Insufficient documentation

## 2019-12-09 DIAGNOSIS — J45909 Unspecified asthma, uncomplicated: Secondary | ICD-10-CM | POA: Diagnosis not present

## 2019-12-09 DIAGNOSIS — Z79899 Other long term (current) drug therapy: Secondary | ICD-10-CM | POA: Diagnosis not present

## 2019-12-09 DIAGNOSIS — M545 Low back pain, unspecified: Secondary | ICD-10-CM

## 2019-12-09 DIAGNOSIS — G8929 Other chronic pain: Secondary | ICD-10-CM | POA: Insufficient documentation

## 2019-12-09 DIAGNOSIS — Z21 Asymptomatic human immunodeficiency virus [HIV] infection status: Secondary | ICD-10-CM | POA: Diagnosis not present

## 2019-12-09 DIAGNOSIS — Z96653 Presence of artificial knee joint, bilateral: Secondary | ICD-10-CM | POA: Insufficient documentation

## 2019-12-09 DIAGNOSIS — J449 Chronic obstructive pulmonary disease, unspecified: Secondary | ICD-10-CM | POA: Diagnosis not present

## 2019-12-09 MED ORDER — ORPHENADRINE CITRATE 30 MG/ML IJ SOLN
60.0000 mg | Freq: Two times a day (BID) | INTRAMUSCULAR | Status: DC
  Administered 2019-12-09: 09:00:00 60 mg via INTRAMUSCULAR
  Filled 2019-12-09: qty 2

## 2019-12-09 MED ORDER — DEXAMETHASONE SODIUM PHOSPHATE 10 MG/ML IJ SOLN
10.0000 mg | Freq: Once | INTRAMUSCULAR | Status: AC
  Administered 2019-12-09: 09:00:00 10 mg via INTRAMUSCULAR
  Filled 2019-12-09: qty 1

## 2019-12-09 MED ORDER — HYDROCODONE-ACETAMINOPHEN 5-325 MG PO TABS: 1.0000 | ORAL_TABLET | Freq: Four times a day (QID) | ORAL | 0 refills | Status: DC | PRN

## 2019-12-09 MED ORDER — HYDROCODONE-ACETAMINOPHEN 5-325 MG PO TABS
1.0000 | ORAL_TABLET | Freq: Once | ORAL | Status: AC
  Administered 2019-12-09: 09:00:00 1 via ORAL
  Filled 2019-12-09: qty 1

## 2019-12-09 NOTE — Discharge Instructions (Addendum)
Call your regular doctor at [facility] tomorrow for further instructions for your chronic back pain.  You may also use ice or heat to your back as needed for discomfort.

## 2019-12-09 NOTE — ED Provider Notes (Signed)
[facility] Emergency Department Provider Note   ____________________________________________   First MD Initiated Contact with Patient 12/09/19 [phone]     (approximate)  I have reviewed the triage vital signs and the nursing notes.   HISTORY  Chief Complaint Back Pain   HPI Melanie Bowen is a 59 y.o. female presents to the ED with complaint of low back pain.  Patient states that she has had surgery on her back and it flares up from time to time.  Patient is asking for the 2 shots that she is gotten in the ED previously as this gives her relief of her back pain quickly.  She denies any urinary symptoms, incontinence of bowel or bladder.  She denies any recent injury.      Past Medical History:  Diagnosis Date  . Acute GI hemorrhage 12/2015   required transfusion  . Allergic rhinitis   . Arthritis    DDD  . Asthma   . Blepharitis   . Bronchitis   . Cigarette smoker   . Claustrophobia   . COPD (chronic obstructive pulmonary disease) ([place])   . Cough   . DDD (degenerative disc disease), lumbar 04/10/2014  . Depression   . Diverticulosis   . Ectopic pregnancy   . Eye irritation   . Genital herpes   . GERD (gastroesophageal reflux disease)   . Headache   . HIV (human immunodeficiency virus infection) ([place])   . Hypertension   . Paresthesia of hand, bilateral   . Shortness of breath dyspnea    at times due to asthma    Patient Active Problem List   Diagnosis Date Noted  . Multifocal pneumonia 11/06/2019  . AKI (acute kidney injury) ([place]) 11/06/2019  . Leukocytosis 11/06/2019  . Diverticulitis large intestine 01/17/2019  . Violation of controlled substance agreement 12/05/2017  . Positive urine drug screen (+cocaine) 12/05/2017  . Cervicalgia 12/05/2017  . Drug-seeking behavior 12/05/2017  . Community acquired pneumonia 11/28/2016  . Pneumonia 11/28/2016  . Allergic rhinitis 05/27/2016  . Tobacco abuse counseling 02/11/2016  .  Symptomatic anemia   . Lower GI bleed   . Anemia 01/16/2016  . GI bleed 01/10/2016  . GERD (gastroesophageal reflux disease) 01/10/2016  . HTN (hypertension) 01/10/2016  . Depression 01/10/2016  . HIV (human immunodeficiency virus infection) ([place]) 01/10/2016  . Lumbar pseudoarthrosis 10/31/2015  . Chronic cough   . Cough 06/19/2015  . DDD (degenerative disc disease), lumbar 12/31/2014  . Degenerative disc disease, lumbar 12/31/2014  . Asthma, chronic 11/12/2014    Past Surgical History:  Procedure Laterality Date  . BACK SURGERY  12/2014   lumbar lam. Dr. Deri Fuelling  . BACK SURGERY  12/25/2012   Removal lumbosubarachnoid shunt system, L5-S1 TF ESI, Dr. Virl Axe Minchew  . back surgery may 2016     lumbar   . BUNIONECTOMY Bilateral    feet  . COLONOSCOPY  12/06/2014   rescheduled from 11/01/2014 due to poor prep  . DIAGNOSTIC LAPAROSCOPY    . DISTAL FEMUR OSTEOTOMY Right    Dr. Earnestine Leys  . ESOPHAGOGASTRODUODENOSCOPY Left 01/11/2016   Procedure: ESOPHAGOGASTRODUODENOSCOPY (EGD);  Surgeon: Hulen Luster, MD;  [facility] ENDOSCOPY;  Service: Endoscopy;  Laterality: Left;  . Finger fracture Right    5th finger  . FRACTURE SURGERY Right 01/25/2014   closed reduction & percutaneous pinning of 5th digit  . HAND SURGERY Bilateral    carpal tunnel  . JOINT REPLACEMENT Bilateral    knees  .  KNEE SURGERY Bilateral 2003,2007   total Joint  . LAPAROSCOPY FOR ECTOPIC PREGNANCY    . PERIPHERAL VASCULAR CATHETERIZATION N/A 01/12/2016   Procedure: Visceral Angiography;  Surgeon: Algernon Huxley, MD;  Location: [place] CV LAB;  Service: Cardiovascular;  Laterality: N/A;  . PERIPHERAL VASCULAR CATHETERIZATION N/A 01/12/2016   Procedure: Visceral Artery Intervention;  Surgeon: Algernon Huxley, MD;  Location: [place] CV LAB;  Service: Cardiovascular;  Laterality: N/A;  . REVISION TOTAL KNEE ARTHROPLASTY Right 12/31/2002   Dr. Marry Guan  . TUBAL LIGATION    . VIDEO BRONCHOSCOPY N/A  06/30/2015   Procedure: VIDEO BRONCHOSCOPY WITHOUT FLUORO;  Surgeon: Vilinda Boehringer, MD;  Location: [facility] ORS;  Service: Cardiopulmonary;  Laterality: N/A;    Prior to Admission medications   Medication Sig Start Date End Date Taking? Authorizing Provider  albuterol (VENTOLIN HFA) 108 (90 Base) MCG/ACT inhaler Inhale 2 puffs into the lungs every 6 (six) hours as needed for wheezing or shortness of breath. 05/21/19   Laban Emperor, PA-C  amLODipine (NORVASC) 10 MG tablet Take 10 mg by mouth daily.     [provider]  DEXILANT 60 MG capsule Take 60 mg by mouth daily. 04/25/19   [provider]  elvitegravir-cobicistat-emtricitabine-tenofovir (GENVOYA) 150-150-200-10 MG TABS tablet Take 1 tablet by mouth at bedtime.    [provider]  famotidine (PEPCID) 20 MG tablet Take 20 mg by mouth daily.  05/08/19   [provider]  hydrochlorothiazide (HYDRODIURIL) 25 MG tablet Take 25 mg by mouth daily.    [provider]  HYDROcodone-acetaminophen (NORCO/VICODIN) 5-325 MG tablet Take 1 tablet by mouth every 6 (six) hours as needed for moderate pain. 12/09/19   Johnn Hai, PA-C  losartan (COZAAR) 50 MG tablet Take 50 mg by mouth daily.    [provider]  valACYclovir (VALTREX) 1000 MG tablet Take 1,000 mg by mouth at bedtime.  04/25/19   [provider]  Grant Ruts INHUB 250-50 MCG/DOSE AEPB Inhale 1 puff into the lungs 2 (two) times daily. 11/05/19   [provider]    Allergies Acetaminophen, Ibuprofen, Gabapentin, Morphine, Morphine and related, and Zanaflex  [tizanidine]  Family History  Problem Relation Age of Onset  . Breast cancer Sister     Social History Social History   Tobacco Use  . Smoking status: Current Some Day Smoker    Packs/day: 0.25    Years: 38.00    Pack years: 9.50    Types: Cigarettes  . Smokeless tobacco: Never Used  . Tobacco comment: 1 cig daily  Substance Use Topics  . Alcohol use: Yes     Alcohol/week: 5.0 standard drinks    Types: 5 Glasses of wine per week    Comment: occ  . Drug use: Not Currently    Review of Systems Constitutional: No fever/chills Cardiovascular: Denies chest pain. Respiratory: Denies shortness of breath. Gastrointestinal: No abdominal pain.  No nausea, no vomiting.  No diarrhea.   Genitourinary: Negative for dysuria. Musculoskeletal: Positive for chronic low back pain. Skin: Negative for rash. Neurological: Negative for headaches, focal weakness or numbness. ____________________________________________   PHYSICAL EXAM:  VITAL SIGNS: ED Triage Vitals  Enc Vitals Group     BP 12/09/19 0751 (!) 155/75     Pulse Rate 12/09/19 0751 94     Resp 12/09/19 0751 18     Temp 12/09/19 0753 98.1 F (36.7 C)     Temp Source 12/09/19 0751 Oral     SpO2 12/09/19 0751 99 %  Weight 12/09/19 0751 269 lb (122 kg)     Height 12/09/19 0751 5\' 6"  (1.676 m)     Head Circumference --      Peak Flow --      Pain Score 12/09/19 0751 10     Pain Loc --      Pain Edu? --      Excl. in [place]? --    Constitutional: Alert and oriented. Well appearing and in no acute distress. Eyes: Conjunctivae are normal. PERRL. EOMI. Head: Atraumatic. Neck: No stridor.   Cardiovascular: Normal rate, regular rhythm. Grossly normal heart sounds.  Good peripheral circulation. Respiratory: Normal respiratory effort.  No retractions. Lungs CTAB. Gastrointestinal: Soft and nontender. No distention. Musculoskeletal: Examination of the thoracic and lumbar spine there is no gross deformity.  There is moderate tenderness on palpation of the lower lumbar area without soft tissue edema or skin discoloration.  Range of motion is restricted secondary to discomfort.  Good muscle strength bilaterally. Neurologic:  Normal speech and language. No gross focal neurologic deficits are appreciated.  Reflexes are 1+ bilaterally.  No gait instability. Skin:  Skin is warm, dry and intact. No rash  noted. Psychiatric: Mood and affect are normal. Speech and behavior are normal.  ____________________________________________   LABS (all labs ordered are listed, but only abnormal results are displayed)  Labs Reviewed - No data to display  PROCEDURES  Procedure(s) performed (including Critical Care):  Procedures   ____________________________________________   INITIAL IMPRESSION / ASSESSMENT AND PLAN / ED COURSE  As part of my medical decision making, I reviewed the following data within the electronic MEDICAL RECORD NUMBER Notes from prior ED visits and [employer] Controlled Substance Database  59 year old female presents to the ED requesting a shot that she is received in the emergency department for.  She has history of chronic back pain and has a flareup that started yesterday.  Patient denies any urinary symptoms, incontinence of bowel or bladder.  There has been no recent injury or change in her back pain.  Looking at the ED visit patient patient received Decadron 10 mg IM and Norflex 60 mg IM on her last visit along with Norco p.o.  This medication combination was ordered again today.  A short course of Norco was sent to her pharmacy.  She states that she will call her regular doctor in [facility] tomorrow for further instructions. ____________________________________________   FINAL CLINICAL IMPRESSION(S) / ED DIAGNOSES  Final diagnoses:  Chronic bilateral low back pain without sciatica     ED Discharge Orders         Ordered    HYDROcodone-acetaminophen (NORCO/VICODIN) 5-325 MG tablet  Every 6 hours PRN     12/09/19 0912           Note:  This document was prepared using Dragon voice recognition software and may include unintentional dictation errors.    Johnn Hai, PA-C 12/09/19 1620    Harvest Dark, MD 12/10/19 1347

## 2019-12-09 NOTE — ED Triage Notes (Signed)
Pt c/o lower back pain since yesterday , states she has a hx of lower back pain with previous surgery and "normally when it flares up they give me a shot and it helps instantly"

## 2019-12-09 NOTE — ED Notes (Signed)
See triage note  presents with lower back pain  States she has had surgery on her back and the pain flares up sometimes  Denies any recent injury

## 2020-03-24 ENCOUNTER — Other Ambulatory Visit: Payer: Self-pay | Admitting: Orthopedic Surgery

## 2020-03-24 DIAGNOSIS — M7540 Impingement syndrome of unspecified shoulder: Secondary | ICD-10-CM

## 2020-04-01 ENCOUNTER — Other Ambulatory Visit: Payer: Self-pay

## 2020-04-01 ENCOUNTER — Other Ambulatory Visit: Payer: Self-pay | Admitting: Orthopedic Surgery

## 2020-04-01 ENCOUNTER — Ambulatory Visit
Admission: RE | Admit: 2020-04-01 | Discharge: 2020-04-01 | Disposition: A | Discharge status: Home / Self Care | Payer: Medicare Other | Source: Ambulatory Visit | Attending: Orthopedic Surgery | Admitting: Orthopedic Surgery

## 2020-04-01 ENCOUNTER — Emergency Department
Admission: EM | Admit: 2020-04-01 | Discharge: 2020-04-01 | Discharge status: Against Medical Advice | Payer: Medicare Other

## 2020-04-01 DIAGNOSIS — M7540 Impingement syndrome of unspecified shoulder: Secondary | ICD-10-CM

## 2020-04-01 DIAGNOSIS — B999 Unspecified infectious disease: Secondary | ICD-10-CM | POA: Diagnosis present

## 2020-04-04 ENCOUNTER — Other Ambulatory Visit: Payer: Self-pay

## 2020-04-04 ENCOUNTER — Encounter: Payer: Self-pay | Admitting: Emergency Medicine

## 2020-04-04 ENCOUNTER — Emergency Department
Admission: EM | Admit: 2020-04-04 | Discharge: 2020-04-04 | Disposition: A | Discharge status: Home / Self Care | Payer: Medicare Other | Attending: Emergency Medicine | Admitting: Emergency Medicine

## 2020-04-04 DIAGNOSIS — J45909 Unspecified asthma, uncomplicated: Secondary | ICD-10-CM | POA: Diagnosis not present

## 2020-04-04 DIAGNOSIS — J449 Chronic obstructive pulmonary disease, unspecified: Secondary | ICD-10-CM | POA: Diagnosis not present

## 2020-04-04 DIAGNOSIS — F17210 Nicotine dependence, cigarettes, uncomplicated: Secondary | ICD-10-CM | POA: Insufficient documentation

## 2020-04-04 DIAGNOSIS — K5732 Diverticulitis of large intestine without perforation or abscess without bleeding: Secondary | ICD-10-CM | POA: Insufficient documentation

## 2020-04-04 DIAGNOSIS — Z96653 Presence of artificial knee joint, bilateral: Secondary | ICD-10-CM | POA: Insufficient documentation

## 2020-04-04 DIAGNOSIS — Z7951 Long term (current) use of inhaled steroids: Secondary | ICD-10-CM | POA: Diagnosis not present

## 2020-04-04 DIAGNOSIS — I10 Essential (primary) hypertension: Secondary | ICD-10-CM | POA: Diagnosis not present

## 2020-04-04 DIAGNOSIS — Z79899 Other long term (current) drug therapy: Secondary | ICD-10-CM | POA: Insufficient documentation

## 2020-04-04 DIAGNOSIS — K219 Gastro-esophageal reflux disease without esophagitis: Secondary | ICD-10-CM | POA: Insufficient documentation

## 2020-04-04 DIAGNOSIS — K5792 Diverticulitis of intestine, part unspecified, without perforation or abscess without bleeding: Secondary | ICD-10-CM

## 2020-04-04 DIAGNOSIS — R1030 Lower abdominal pain, unspecified: Secondary | ICD-10-CM | POA: Diagnosis present

## 2020-04-04 LAB — URINALYSIS, COMPLETE (UACMP) WITH MICROSCOPIC
Bilirubin Urine: NEGATIVE
Glucose, UA: NEGATIVE mg/dL
Hgb urine dipstick: NEGATIVE
Ketones, ur: NEGATIVE mg/dL
Nitrite: NEGATIVE
Protein, ur: NEGATIVE mg/dL
Specific Gravity, Urine: 1.018 (ref 1.005–1.030)
pH: 5 (ref 5.0–8.0)

## 2020-04-04 LAB — CBC
HCT: 37.3 % (ref 36.0–46.0)
Hemoglobin: 13 g/dL (ref 12.0–15.0)
MCH: 30.9 pg (ref 26.0–34.0)
MCHC: 34.9 g/dL (ref 30.0–36.0)
MCV: 88.6 fL (ref 80.0–100.0)
Platelets: 269 10*3/uL (ref 150–400)
RBC: 4.21 MIL/uL (ref 3.87–5.11)
RDW: 19.2 % — ABNORMAL HIGH (ref 11.5–15.5)
WBC: 17.2 10*3/uL — ABNORMAL HIGH (ref 4.0–10.5)
nRBC: 0 % (ref 0.0–0.2)

## 2020-04-04 LAB — COMPREHENSIVE METABOLIC PANEL
ALT: 27 U/L (ref 0–44)
AST: 19 U/L (ref 15–41)
Albumin: 3.2 g/dL — ABNORMAL LOW (ref 3.5–5.0)
Alkaline Phosphatase: 75 U/L (ref 38–126)
Anion gap: 8 (ref 5–15)
BUN: 26 mg/dL — ABNORMAL HIGH (ref 6–20)
CO2: 25 mmol/L (ref 22–32)
Calcium: 8.6 mg/dL — ABNORMAL LOW (ref 8.9–10.3)
Chloride: 105 mmol/L (ref 98–111)
Creatinine, Ser: 1.03 mg/dL — ABNORMAL HIGH (ref 0.44–1.00)
GFR calc Af Amer: >60 mL/min (ref >60)
GFR calc non Af Amer: 60 mL/min — ABNORMAL LOW (ref >60)
Glucose, Bld: 100 mg/dL — ABNORMAL HIGH (ref 70–99)
Potassium: 4.1 mmol/L (ref 3.5–5.1)
Sodium: 138 mmol/L (ref 135–145)
Total Bilirubin: 0.9 mg/dL (ref 0.3–1.2)
Total Protein: 6.4 g/dL — ABNORMAL LOW (ref 6.5–8.1)

## 2020-04-04 LAB — LIPASE, BLOOD: Lipase: 34 U/L (ref 11–51)

## 2020-04-04 MED ORDER — HYDROCODONE-ACETAMINOPHEN 5-325 MG PO TABS
1.0000 | ORAL_TABLET | Freq: Once | ORAL | Status: AC
  Administered 2020-04-04: 1 via ORAL
  Filled 2020-04-04: qty 1

## 2020-04-04 MED ORDER — HYDROCODONE-ACETAMINOPHEN 5-325 MG PO TABS: 1.0000 | ORAL_TABLET | Freq: Four times a day (QID) | ORAL | 0 refills | Status: DC | PRN

## 2020-04-04 MED ORDER — AMOXICILLIN-POT CLAVULANATE 875-125 MG PO TABS: 1.0000 | ORAL_TABLET | Freq: Two times a day (BID) | ORAL | 0 refills | Status: AC

## 2020-04-04 MED ORDER — SODIUM CHLORIDE 0.9% FLUSH: 3.0000 mL | Freq: Once | INTRAVENOUS | Status: DC

## 2020-04-04 NOTE — ED Triage Notes (Signed)
Pt to ED via [facility] from work for abdominal pain that started yesterday. Pt states that she is also having nausea and diarrhea. Pt has hx/o diverticulitis. Pt is in NAD.

## 2020-04-04 NOTE — ED Provider Notes (Signed)
[facility] Emergency Department Provider Note   ____________________________________________    I have reviewed the triage vital signs and the nursing notes.   HISTORY  Chief Complaint Abdominal Pain     HPI Melanie Bowen is a 59 y.o. female with a history of HIV, COPD, diverticulosis who presents with complaints of lower abdominal pain.  Patient reports this feels similar to when she last had diverticulitis.  She denies fevers or chills.  No nausea or vomiting.  Describes cramping in her lower abdomen, left greater than right.  No diarrhea.  Is not take anything for this.  Symptoms started yesterday.  She denies dysuria.  No sick contacts reported.  Past Medical History:  Diagnosis Date   Acute GI hemorrhage 12/2015   required transfusion   Allergic rhinitis    Arthritis    DDD   Asthma    Blepharitis    Bronchitis    Cigarette smoker    Claustrophobia    COPD (chronic obstructive pulmonary disease) ([facility])    Cough    DDD (degenerative disc disease), lumbar 04/10/2014   Depression    Diverticulosis    Ectopic pregnancy    Eye irritation    Genital herpes    GERD (gastroesophageal reflux disease)    Headache    HIV (human immunodeficiency virus infection) ([place])    Hypertension    Paresthesia of hand, bilateral    Shortness of breath dyspnea    at times due to asthma    Patient Active Problem List   Diagnosis Date Noted   Multifocal pneumonia 11/06/2019   AKI (acute kidney injury) ([place]) 11/06/2019   Leukocytosis 11/06/2019   Diverticulitis large intestine 01/17/2019   Violation of controlled substance agreement 12/05/2017   Positive urine drug screen (+cocaine) 12/05/2017   Cervicalgia 12/05/2017   Drug-seeking behavior 12/05/2017   Community acquired pneumonia 11/28/2016   Pneumonia 11/28/2016   Allergic rhinitis 05/27/2016   Tobacco abuse counseling 02/11/2016   Symptomatic anemia     Lower GI bleed    Anemia 01/16/2016   GI bleed 01/10/2016   GERD (gastroesophageal reflux disease) 01/10/2016   HTN (hypertension) 01/10/2016   Depression 01/10/2016   HIV (human immunodeficiency virus infection) ([place]) 01/10/2016   Lumbar pseudoarthrosis 10/31/2015   Chronic cough    Cough 06/19/2015   DDD (degenerative disc disease), lumbar 12/31/2014   Degenerative disc disease, lumbar 12/31/2014   Asthma, chronic 11/12/2014    Past Surgical History:  Procedure Laterality Date   BACK SURGERY  12/2014   lumbar lam. Dr. Deri Fuelling   BACK SURGERY  12/25/2012   Removal lumbosubarachnoid shunt system, L5-S1 TF ESI, Dr. Virl Axe Minchew   back surgery may 2016     lumbar    BUNIONECTOMY Bilateral    feet   COLONOSCOPY  12/06/2014   rescheduled from 11/01/2014 due to poor prep   DIAGNOSTIC LAPAROSCOPY     DISTAL FEMUR OSTEOTOMY Right    Dr. Earnestine Leys   ESOPHAGOGASTRODUODENOSCOPY Left 01/11/2016   Procedure: ESOPHAGOGASTRODUODENOSCOPY (EGD);  Surgeon: Hulen Luster, MD;  Location: [facility] ENDOSCOPY;  Service: Endoscopy;  Laterality: Left;   Finger fracture Right    5th finger   FRACTURE SURGERY Right 01/25/2014   closed reduction & percutaneous pinning of 5th digit   HAND SURGERY Bilateral    carpal tunnel   JOINT REPLACEMENT Bilateral    knees   KNEE SURGERY Bilateral 2003,2007   total Joint   LAPAROSCOPY FOR ECTOPIC  PREGNANCY     PERIPHERAL VASCULAR CATHETERIZATION N/A 01/12/2016   Procedure: Visceral Angiography;  Surgeon: Algernon Huxley, MD;  Location: [place] [facility];  Service: Cardiovascular;  Laterality: N/A;   PERIPHERAL VASCULAR CATHETERIZATION N/A 01/12/2016   Procedure: Visceral Artery Intervention;  Surgeon: Algernon Huxley, MD;  Location: [place] [facility];  Service: Cardiovascular;  Laterality: N/A;   REVISION TOTAL KNEE ARTHROPLASTY Right 12/31/2002   Dr. Marry Guan   TUBAL LIGATION     VIDEO BRONCHOSCOPY N/A 06/30/2015   Procedure:  VIDEO BRONCHOSCOPY WITHOUT FLUORO;  Surgeon: Vilinda Boehringer, MD;  Location: [facility] ORS;  Service: Cardiopulmonary;  Laterality: N/A;    Prior to Admission medications   Medication Sig Start Date End Date Taking? Authorizing Provider  albuterol (VENTOLIN HFA) 108 (90 Base) MCG/ACT inhaler Inhale 2 puffs into the lungs every 6 (six) hours as needed for wheezing or shortness of breath. 05/21/19   Laban Emperor, PA-C  amLODipine (NORVASC) 10 MG tablet Take 10 mg by mouth daily.     [provider]  amoxicillin-clavulanate (AUGMENTIN) 875-125 MG tablet Take 1 tablet by mouth 2 (two) times daily for 7 days. 04/04/20 04/11/20  Lavonia Drafts, MD  DEXILANT 60 MG capsule Take 60 mg by mouth daily. 04/25/19   [provider]  elvitegravir-cobicistat-emtricitabine-tenofovir (GENVOYA) 150-150-200-10 MG TABS tablet Take 1 tablet by mouth at bedtime.    [provider]  famotidine (PEPCID) 20 MG tablet Take 20 mg by mouth daily.  05/08/19   [provider]  hydrochlorothiazide (HYDRODIURIL) 25 MG tablet Take 25 mg by mouth daily.    [provider]  HYDROcodone-acetaminophen (NORCO/VICODIN) 5-325 MG tablet Take 1 tablet by mouth every 6 (six) hours as needed for severe pain. 04/04/20   Lavonia Drafts, MD  losartan (COZAAR) 50 MG tablet Take 50 mg by mouth daily.    [provider]  valACYclovir (VALTREX) 1000 MG tablet Take 1,000 mg by mouth at bedtime.  04/25/19   [provider]  Grant Ruts INHUB 250-50 MCG/DOSE AEPB Inhale 1 puff into the lungs 2 (two) times daily. 11/05/19   [provider]     Allergies Acetaminophen, Ibuprofen, Gabapentin, Morphine, Morphine and related, and Zanaflex  [tizanidine]  Family History  Problem Relation Age of Onset   Breast cancer Sister     Social History Social History   Tobacco Use   Smoking status: Current Some Day Smoker    Packs/day: 0.25    Years: 38.00    Pack years: 9.50    Types: Cigarettes    Smokeless tobacco: Never Used   Tobacco comment: 1 cig daily  Vaping Use   Vaping Use: Never used  Substance Use Topics   Alcohol use: Yes    Alcohol/week: 5.0 standard drinks    Types: 5 Glasses of wine per week    Comment: occ   Drug use: Not Currently    Review of Systems  Constitutional: No fever/chills Eyes: No visual changes.  ENT: No sore throat. Cardiovascular: Denies chest pain. Respiratory: Denies shortness of breath. Gastrointestinal: As above Genitourinary: Negative for dysuria.  No frequency Musculoskeletal: Negative for back pain. Skin: Negative for rash. Neurological: Negative for headaches or weakness   ____________________________________________   PHYSICAL EXAM:  VITAL SIGNS: ED Triage Vitals  Enc Vitals Group     BP 04/04/20 1022 (!) 129/56     Pulse Rate 04/04/20 1022 83     Resp 04/04/20 1022 16     Temp 04/04/20 1025 99.1  F (37.3 C)     Temp Source 04/04/20 1025 Oral     SpO2 04/04/20 1022 98 %     Weight 04/04/20 1023 121.1 kg (267 lb)     Height 04/04/20 1023 1.676 m (5\' 6" )     Head Circumference --      Peak Flow --      Pain Score 04/04/20 1023 10     Pain Loc --      Pain Edu? --      Excl. in [place]? --     Constitutional: Alert and oriented.  Pleasant and interactive  Nose: No congestion/rhinnorhea. Mouth/Throat: Mucous membranes are moist.    Cardiovascular: Normal rate, regular rhythm.  Good peripheral circulation. Respiratory: Normal respiratory effort.  No retractions. Lungs CTAB. Gastrointestinal: Soft, mild tenderness left lower quadrant and right lower quadrant. No distention.  No CVA tenderness.  Musculoskeletal:  Warm and well perfused Neurologic:  Normal speech and language. No gross focal neurologic deficits are appreciated.  Skin:  Skin is warm, dry and intact. No rash noted. Psychiatric: Mood and affect are normal. Speech and behavior are normal.  ____________________________________________   LABS (all  labs ordered are listed, but only abnormal results are displayed)  Labs Reviewed  COMPREHENSIVE METABOLIC PANEL - Abnormal; Notable for the following components:      Result Value   Glucose, Bld 100 (*)    BUN 26 (*)    Creatinine, Ser 1.03 (*)    Calcium 8.6 (*)    Total Protein 6.4 (*)    Albumin 3.2 (*)    GFR calc non Af Amer 60 (*)    All other components within normal limits  CBC - Abnormal; Notable for the following components:   WBC 17.2 (*)    RDW 19.2 (*)    All other components within normal limits  URINALYSIS, COMPLETE (UACMP) WITH MICROSCOPIC - Abnormal; Notable for the following components:   Color, Urine YELLOW (*)    APPearance CLOUDY (*)    Leukocytes,Ua LARGE (*)    Bacteria, UA RARE (*)    All other components within normal limits  LIPASE, BLOOD  POC URINE PREG, ED   ____________________________________________  EKG  None ____________________________________________  RADIOLOGY  None ____________________________________________   PROCEDURES  Procedure(s) performed: No  Procedures   Critical Care performed: No ____________________________________________   INITIAL IMPRESSION / ASSESSMENT AND PLAN / ED COURSE  Pertinent labs & imaging results that were available during my care of the patient were reviewed by me and considered in my medical decision making (see chart for details).  Patient presents with lower abdominal pain as described above.  Differential includes diverticulitis, urinary tract infection, colitis  Lab work is notable for elevated white blood cell counts however this appears to be chronic.  Mildly elevated BUN/creatinine ratio suggesting dehydration, mild.  Urinalysis suspicious for possible early urinary tract infection.  However tenderness is most consistent with diverticulitis, recommended CT imaging however patient declined and stated that she was ready to go home as last time she felt better with antibiotics and she has  no interest in staying in the hospital.  She does agree to return immediately if any worsening of her symptoms, antibiotics and analgesics prescribed    ____________________________________________   FINAL CLINICAL IMPRESSION(S) / ED DIAGNOSES  Final diagnoses:  Diverticulitis        Note:  This document was prepared using Dragon voice recognition software and may include unintentional dictation errors.   Lavonia Drafts, MD  04/04/20 1218 ° °

## 2020-04-10 ENCOUNTER — Ambulatory Visit: Payer: Medicare Other

## 2020-04-10 ENCOUNTER — Other Ambulatory Visit: Payer: Self-pay | Admitting: Orthopedic Surgery

## 2020-04-13 DIAGNOSIS — E875 Hyperkalemia: Secondary | ICD-10-CM | POA: Diagnosis not present

## 2020-04-13 DIAGNOSIS — N170 Acute kidney failure with tubular necrosis: Secondary | ICD-10-CM | POA: Diagnosis present

## 2020-04-13 DIAGNOSIS — R6521 Severe sepsis with septic shock: Secondary | ICD-10-CM | POA: Diagnosis not present

## 2020-04-13 DIAGNOSIS — K5720 Diverticulitis of large intestine with perforation and abscess without bleeding: Secondary | ICD-10-CM | POA: Diagnosis present

## 2020-04-13 DIAGNOSIS — R188 Other ascites: Secondary | ICD-10-CM | POA: Diagnosis present

## 2020-04-13 DIAGNOSIS — D6489 Other specified anemias: Secondary | ICD-10-CM | POA: Diagnosis present

## 2020-04-13 DIAGNOSIS — Z992 Dependence on renal dialysis: Secondary | ICD-10-CM

## 2020-04-13 DIAGNOSIS — Z803 Family history of malignant neoplasm of breast: Secondary | ICD-10-CM

## 2020-04-13 DIAGNOSIS — Z6841 Body Mass Index (BMI) 40.0 and over, adult: Secondary | ICD-10-CM

## 2020-04-13 DIAGNOSIS — E8770 Fluid overload, unspecified: Secondary | ICD-10-CM | POA: Diagnosis not present

## 2020-04-13 DIAGNOSIS — I4819 Other persistent atrial fibrillation: Secondary | ICD-10-CM | POA: Diagnosis not present

## 2020-04-13 DIAGNOSIS — T501X5A Adverse effect of loop [high-ceiling] diuretics, initial encounter: Secondary | ICD-10-CM | POA: Diagnosis not present

## 2020-04-13 DIAGNOSIS — J441 Chronic obstructive pulmonary disease with (acute) exacerbation: Secondary | ICD-10-CM | POA: Diagnosis not present

## 2020-04-13 DIAGNOSIS — Z96651 Presence of right artificial knee joint: Secondary | ICD-10-CM | POA: Diagnosis present

## 2020-04-13 DIAGNOSIS — E876 Hypokalemia: Secondary | ICD-10-CM | POA: Diagnosis not present

## 2020-04-13 DIAGNOSIS — M1990 Unspecified osteoarthritis, unspecified site: Secondary | ICD-10-CM | POA: Diagnosis present

## 2020-04-13 DIAGNOSIS — R1030 Lower abdominal pain, unspecified: Secondary | ICD-10-CM | POA: Diagnosis not present

## 2020-04-13 DIAGNOSIS — D72829 Elevated white blood cell count, unspecified: Secondary | ICD-10-CM | POA: Diagnosis not present

## 2020-04-13 DIAGNOSIS — A4152 Sepsis due to Pseudomonas: Secondary | ICD-10-CM | POA: Diagnosis present

## 2020-04-13 DIAGNOSIS — A4151 Sepsis due to Escherichia coli [E. coli]: Principal | ICD-10-CM | POA: Diagnosis present

## 2020-04-13 DIAGNOSIS — Z1611 Resistance to penicillins: Secondary | ICD-10-CM | POA: Diagnosis not present

## 2020-04-13 DIAGNOSIS — B20 Human immunodeficiency virus [HIV] disease: Secondary | ICD-10-CM | POA: Diagnosis present

## 2020-04-13 DIAGNOSIS — I953 Hypotension of hemodialysis: Secondary | ICD-10-CM | POA: Diagnosis not present

## 2020-04-13 DIAGNOSIS — T380X5A Adverse effect of glucocorticoids and synthetic analogues, initial encounter: Secondary | ICD-10-CM | POA: Diagnosis not present

## 2020-04-13 DIAGNOSIS — B965 Pseudomonas (aeruginosa) (mallei) (pseudomallei) as the cause of diseases classified elsewhere: Secondary | ICD-10-CM | POA: Diagnosis present

## 2020-04-13 DIAGNOSIS — D696 Thrombocytopenia, unspecified: Secondary | ICD-10-CM | POA: Diagnosis not present

## 2020-04-13 DIAGNOSIS — Z885 Allergy status to narcotic agent status: Secondary | ICD-10-CM

## 2020-04-13 DIAGNOSIS — G92 Toxic encephalopathy: Secondary | ICD-10-CM | POA: Diagnosis not present

## 2020-04-13 DIAGNOSIS — J9621 Acute and chronic respiratory failure with hypoxia: Secondary | ICD-10-CM | POA: Diagnosis not present

## 2020-04-13 DIAGNOSIS — Z886 Allergy status to analgesic agent status: Secondary | ICD-10-CM

## 2020-04-13 DIAGNOSIS — F329 Major depressive disorder, single episode, unspecified: Secondary | ICD-10-CM | POA: Diagnosis present

## 2020-04-13 DIAGNOSIS — K219 Gastro-esophageal reflux disease without esophagitis: Secondary | ICD-10-CM | POA: Diagnosis present

## 2020-04-13 DIAGNOSIS — R34 Anuria and oliguria: Secondary | ICD-10-CM | POA: Diagnosis not present

## 2020-04-13 DIAGNOSIS — L89311 Pressure ulcer of right buttock, stage 1: Secondary | ICD-10-CM | POA: Diagnosis not present

## 2020-04-13 DIAGNOSIS — Z981 Arthrodesis status: Secondary | ICD-10-CM

## 2020-04-13 DIAGNOSIS — N2581 Secondary hyperparathyroidism of renal origin: Secondary | ICD-10-CM | POA: Diagnosis not present

## 2020-04-13 DIAGNOSIS — Z79899 Other long term (current) drug therapy: Secondary | ICD-10-CM

## 2020-04-13 DIAGNOSIS — K7200 Acute and subacute hepatic failure without coma: Secondary | ICD-10-CM | POA: Diagnosis not present

## 2020-04-13 DIAGNOSIS — T80219A Unspecified infection due to central venous catheter, initial encounter: Secondary | ICD-10-CM | POA: Diagnosis not present

## 2020-04-13 DIAGNOSIS — E874 Mixed disorder of acid-base balance: Secondary | ICD-10-CM | POA: Diagnosis not present

## 2020-04-13 DIAGNOSIS — T508X5A Adverse effect of diagnostic agents, initial encounter: Secondary | ICD-10-CM | POA: Diagnosis not present

## 2020-04-13 DIAGNOSIS — Z888 Allergy status to other drugs, medicaments and biological substances status: Secondary | ICD-10-CM

## 2020-04-13 DIAGNOSIS — Z9119 Patient's noncompliance with other medical treatment and regimen: Secondary | ICD-10-CM

## 2020-04-13 DIAGNOSIS — F17210 Nicotine dependence, cigarettes, uncomplicated: Secondary | ICD-10-CM | POA: Diagnosis present

## 2020-04-13 DIAGNOSIS — E871 Hypo-osmolality and hyponatremia: Secondary | ICD-10-CM | POA: Diagnosis present

## 2020-04-13 DIAGNOSIS — J9811 Atelectasis: Secondary | ICD-10-CM | POA: Diagnosis not present

## 2020-04-13 DIAGNOSIS — K632 Fistula of intestine: Secondary | ICD-10-CM | POA: Diagnosis not present

## 2020-04-13 DIAGNOSIS — Z20822 Contact with and (suspected) exposure to covid-19: Secondary | ICD-10-CM | POA: Diagnosis present

## 2020-04-13 DIAGNOSIS — M898X9 Other specified disorders of bone, unspecified site: Secondary | ICD-10-CM | POA: Diagnosis present

## 2020-04-13 DIAGNOSIS — Z8673 Personal history of transient ischemic attack (TIA), and cerebral infarction without residual deficits: Secondary | ICD-10-CM

## 2020-04-13 DIAGNOSIS — J810 Acute pulmonary edema: Secondary | ICD-10-CM | POA: Diagnosis not present

## 2020-04-13 LAB — CBC
HCT: 40.4 % (ref 36.0–46.0)
Hemoglobin: 13.4 g/dL (ref 12.0–15.0)
MCH: 30.5 pg (ref 26.0–34.0)
MCHC: 33.2 g/dL (ref 30.0–36.0)
MCV: 92 fL (ref 80.0–100.0)
Platelets: 354 10*3/uL (ref 150–400)
RBC: 4.39 MIL/uL (ref 3.87–5.11)
RDW: 18.4 % — ABNORMAL HIGH (ref 11.5–15.5)
WBC: 7.8 10*3/uL (ref 4.0–10.5)
nRBC: 0.8 % — ABNORMAL HIGH (ref 0.0–0.2)

## 2020-04-13 LAB — URINALYSIS, COMPLETE (UACMP) WITH MICROSCOPIC
Bacteria, UA: NONE SEEN
Bilirubin Urine: NEGATIVE
Glucose, UA: NEGATIVE mg/dL
Hgb urine dipstick: NEGATIVE
Ketones, ur: NEGATIVE mg/dL
Leukocytes,Ua: NEGATIVE
Nitrite: NEGATIVE
Protein, ur: NEGATIVE mg/dL
Specific Gravity, Urine: 1.023 (ref 1.005–1.030)
pH: 5 (ref 5.0–8.0)

## 2020-04-13 LAB — COMPREHENSIVE METABOLIC PANEL
ALT: 31 U/L (ref 0–44)
AST: 25 U/L (ref 15–41)
Albumin: 2.8 g/dL — ABNORMAL LOW (ref 3.5–5.0)
Alkaline Phosphatase: 89 U/L (ref 38–126)
Anion gap: 11 (ref 5–15)
BUN: 25 mg/dL — ABNORMAL HIGH (ref 6–20)
CO2: 23 mmol/L (ref 22–32)
Calcium: 8.6 mg/dL — ABNORMAL LOW (ref 8.9–10.3)
Chloride: 101 mmol/L (ref 98–111)
Creatinine, Ser: 0.8 mg/dL (ref 0.44–1.00)
GFR calc Af Amer: >60 mL/min (ref >60)
GFR calc non Af Amer: >60 mL/min (ref >60)
Glucose, Bld: 125 mg/dL — ABNORMAL HIGH (ref 70–99)
Potassium: 3.8 mmol/L (ref 3.5–5.1)
Sodium: 135 mmol/L (ref 135–145)
Total Bilirubin: 0.6 mg/dL (ref 0.3–1.2)
Total Protein: 6.4 g/dL — ABNORMAL LOW (ref 6.5–8.1)

## 2020-04-13 MED ORDER — HYDROCODONE-ACETAMINOPHEN 5-325 MG PO TABS
1.0000 | ORAL_TABLET | Freq: Once | ORAL | Status: AC
  Administered 2020-04-13: 1 via ORAL
  Filled 2020-04-13: qty 1

## 2020-04-13 NOTE — ED Notes (Signed)
Patient requesting Vicodin for her pain. Refused the Fentanyl order I received for her stating that the Vicodin is the only thing that will help her pain.

## 2020-04-13 NOTE — ED Triage Notes (Signed)
Patient to ED via EMS for complaints of mid abdominal pain. Patient with history of diverticulitis. Patient states last normal bowel movement was this morning but hasn't been able to go very well. Patient is hollering and moaning in pain. States she needs pain medicine. Was given what she believes was an antibiotic but that has not helped.

## 2020-04-13 NOTE — ED Notes (Signed)
Pt called me over and stated she wasn't feeling any better and wants something done.  Pts vitals redone and are WNL.  RN notified

## 2020-04-14 ENCOUNTER — Emergency Department: Payer: Medicare Other

## 2020-04-14 ENCOUNTER — Inpatient Hospital Stay: Payer: Medicare Other

## 2020-04-14 ENCOUNTER — Other Ambulatory Visit: Payer: Self-pay

## 2020-04-14 ENCOUNTER — Inpatient Hospital Stay
Admission: EM | Admit: 2020-04-14 | Discharge: 2020-05-23 | DRG: 969 | Disposition: A | Discharge status: Skilled Nursing Facility | Payer: Medicare Other | Attending: Internal Medicine | Admitting: Internal Medicine

## 2020-04-14 DIAGNOSIS — J9621 Acute and chronic respiratory failure with hypoxia: Secondary | ICD-10-CM | POA: Diagnosis not present

## 2020-04-14 DIAGNOSIS — R6521 Severe sepsis with septic shock: Secondary | ICD-10-CM

## 2020-04-14 DIAGNOSIS — K5780 Diverticulitis of intestine, part unspecified, with perforation and abscess without bleeding: Secondary | ICD-10-CM

## 2020-04-14 DIAGNOSIS — G9341 Metabolic encephalopathy: Secondary | ICD-10-CM | POA: Diagnosis not present

## 2020-04-14 DIAGNOSIS — A4151 Sepsis due to Escherichia coli [E. coli]: Secondary | ICD-10-CM | POA: Diagnosis present

## 2020-04-14 DIAGNOSIS — K810 Acute cholecystitis: Secondary | ICD-10-CM | POA: Diagnosis not present

## 2020-04-14 DIAGNOSIS — L8990 Pressure ulcer of unspecified site, unspecified stage: Secondary | ICD-10-CM | POA: Insufficient documentation

## 2020-04-14 DIAGNOSIS — B20 Human immunodeficiency virus [HIV] disease: Secondary | ICD-10-CM | POA: Diagnosis not present

## 2020-04-14 DIAGNOSIS — R630 Anorexia: Secondary | ICD-10-CM | POA: Diagnosis not present

## 2020-04-14 DIAGNOSIS — R509 Fever, unspecified: Secondary | ICD-10-CM | POA: Diagnosis not present

## 2020-04-14 DIAGNOSIS — N2581 Secondary hyperparathyroidism of renal origin: Secondary | ICD-10-CM | POA: Diagnosis not present

## 2020-04-14 DIAGNOSIS — R0600 Dyspnea, unspecified: Secondary | ICD-10-CM

## 2020-04-14 DIAGNOSIS — I10 Essential (primary) hypertension: Secondary | ICD-10-CM | POA: Diagnosis not present

## 2020-04-14 DIAGNOSIS — R188 Other ascites: Secondary | ICD-10-CM | POA: Diagnosis present

## 2020-04-14 DIAGNOSIS — I82409 Acute embolism and thrombosis of unspecified deep veins of unspecified lower extremity: Secondary | ICD-10-CM

## 2020-04-14 DIAGNOSIS — I4819 Other persistent atrial fibrillation: Secondary | ICD-10-CM | POA: Diagnosis not present

## 2020-04-14 DIAGNOSIS — K5720 Diverticulitis of large intestine with perforation and abscess without bleeding: Secondary | ICD-10-CM | POA: Diagnosis present

## 2020-04-14 DIAGNOSIS — K219 Gastro-esophageal reflux disease without esophagitis: Secondary | ICD-10-CM | POA: Diagnosis present

## 2020-04-14 DIAGNOSIS — Z1611 Resistance to penicillins: Secondary | ICD-10-CM | POA: Diagnosis not present

## 2020-04-14 DIAGNOSIS — J9601 Acute respiratory failure with hypoxia: Secondary | ICD-10-CM | POA: Diagnosis not present

## 2020-04-14 DIAGNOSIS — Z515 Encounter for palliative care: Secondary | ICD-10-CM

## 2020-04-14 DIAGNOSIS — N170 Acute kidney failure with tubular necrosis: Secondary | ICD-10-CM | POA: Diagnosis present

## 2020-04-14 DIAGNOSIS — Z7189 Other specified counseling: Secondary | ICD-10-CM | POA: Diagnosis not present

## 2020-04-14 DIAGNOSIS — R0603 Acute respiratory distress: Secondary | ICD-10-CM | POA: Diagnosis not present

## 2020-04-14 DIAGNOSIS — J449 Chronic obstructive pulmonary disease, unspecified: Secondary | ICD-10-CM

## 2020-04-14 DIAGNOSIS — G9340 Encephalopathy, unspecified: Secondary | ICD-10-CM | POA: Diagnosis not present

## 2020-04-14 DIAGNOSIS — Z20822 Contact with and (suspected) exposure to covid-19: Secondary | ICD-10-CM | POA: Diagnosis present

## 2020-04-14 DIAGNOSIS — K7290 Hepatic failure, unspecified without coma: Secondary | ICD-10-CM | POA: Diagnosis not present

## 2020-04-14 DIAGNOSIS — R0609 Other forms of dyspnea: Secondary | ICD-10-CM | POA: Diagnosis not present

## 2020-04-14 DIAGNOSIS — Z5329 Procedure and treatment not carried out because of patient's decision for other reasons: Secondary | ICD-10-CM | POA: Diagnosis not present

## 2020-04-14 DIAGNOSIS — Z6841 Body Mass Index (BMI) 40.0 and over, adult: Secondary | ICD-10-CM | POA: Diagnosis not present

## 2020-04-14 DIAGNOSIS — R7401 Elevation of levels of liver transaminase levels: Secondary | ICD-10-CM | POA: Diagnosis not present

## 2020-04-14 DIAGNOSIS — M79601 Pain in right arm: Secondary | ICD-10-CM

## 2020-04-14 DIAGNOSIS — E872 Acidosis: Secondary | ICD-10-CM | POA: Diagnosis not present

## 2020-04-14 DIAGNOSIS — G92 Toxic encephalopathy: Secondary | ICD-10-CM | POA: Diagnosis not present

## 2020-04-14 DIAGNOSIS — T80219A Unspecified infection due to central venous catheter, initial encounter: Secondary | ICD-10-CM | POA: Diagnosis not present

## 2020-04-14 DIAGNOSIS — I959 Hypotension, unspecified: Secondary | ICD-10-CM | POA: Diagnosis not present

## 2020-04-14 DIAGNOSIS — A419 Sepsis, unspecified organism: Secondary | ICD-10-CM

## 2020-04-14 DIAGNOSIS — F17210 Nicotine dependence, cigarettes, uncomplicated: Secondary | ICD-10-CM | POA: Diagnosis not present

## 2020-04-14 DIAGNOSIS — M7989 Other specified soft tissue disorders: Secondary | ICD-10-CM

## 2020-04-14 DIAGNOSIS — N179 Acute kidney failure, unspecified: Secondary | ICD-10-CM

## 2020-04-14 DIAGNOSIS — K5732 Diverticulitis of large intestine without perforation or abscess without bleeding: Secondary | ICD-10-CM

## 2020-04-14 DIAGNOSIS — K7200 Acute and subacute hepatic failure without coma: Secondary | ICD-10-CM | POA: Diagnosis not present

## 2020-04-14 DIAGNOSIS — K5790 Diverticulosis of intestine, part unspecified, without perforation or abscess without bleeding: Secondary | ICD-10-CM | POA: Diagnosis present

## 2020-04-14 DIAGNOSIS — E871 Hypo-osmolality and hyponatremia: Secondary | ICD-10-CM | POA: Diagnosis present

## 2020-04-14 DIAGNOSIS — R601 Generalized edema: Secondary | ICD-10-CM | POA: Diagnosis not present

## 2020-04-14 DIAGNOSIS — E874 Mixed disorder of acid-base balance: Secondary | ICD-10-CM | POA: Diagnosis not present

## 2020-04-14 DIAGNOSIS — N185 Chronic kidney disease, stage 5: Secondary | ICD-10-CM | POA: Diagnosis not present

## 2020-04-14 DIAGNOSIS — J810 Acute pulmonary edema: Secondary | ICD-10-CM | POA: Diagnosis not present

## 2020-04-14 DIAGNOSIS — I4891 Unspecified atrial fibrillation: Secondary | ICD-10-CM | POA: Diagnosis not present

## 2020-04-14 DIAGNOSIS — D696 Thrombocytopenia, unspecified: Secondary | ICD-10-CM | POA: Diagnosis not present

## 2020-04-14 DIAGNOSIS — D72829 Elevated white blood cell count, unspecified: Secondary | ICD-10-CM | POA: Diagnosis not present

## 2020-04-14 DIAGNOSIS — K632 Fistula of intestine: Secondary | ICD-10-CM | POA: Diagnosis not present

## 2020-04-14 DIAGNOSIS — Z992 Dependence on renal dialysis: Secondary | ICD-10-CM | POA: Diagnosis not present

## 2020-04-14 DIAGNOSIS — J9811 Atelectasis: Secondary | ICD-10-CM | POA: Diagnosis not present

## 2020-04-14 DIAGNOSIS — R1030 Lower abdominal pain, unspecified: Secondary | ICD-10-CM | POA: Diagnosis present

## 2020-04-14 DIAGNOSIS — J441 Chronic obstructive pulmonary disease with (acute) exacerbation: Secondary | ICD-10-CM | POA: Diagnosis not present

## 2020-04-14 LAB — CBC WITH DIFFERENTIAL/PLATELET
Abs Immature Granulocytes: 0.08 10*3/uL — ABNORMAL HIGH (ref 0.00–0.07)
Basophils Absolute: 0.1 10*3/uL (ref 0.0–0.1)
Basophils Relative: 1 %
Eosinophils Absolute: 0 10*3/uL (ref 0.0–0.5)
Eosinophils Relative: 0 %
HCT: 41.7 % (ref 36.0–46.0)
Hemoglobin: 13.7 g/dL (ref 12.0–15.0)
Immature Granulocytes: 1 %
Lymphocytes Relative: 11 %
Lymphs Abs: 0.8 10*3/uL (ref 0.7–4.0)
MCH: 30.3 pg (ref 26.0–34.0)
MCHC: 32.9 g/dL (ref 30.0–36.0)
MCV: 92.3 fL (ref 80.0–100.0)
Monocytes Absolute: 0.4 10*3/uL (ref 0.1–1.0)
Monocytes Relative: 5 %
Neutro Abs: 6.1 10*3/uL (ref 1.7–7.7)
Neutrophils Relative %: 82 %
Platelets: 294 10*3/uL (ref 150–400)
RBC: 4.52 MIL/uL (ref 3.87–5.11)
RDW: 18.8 % — ABNORMAL HIGH (ref 11.5–15.5)
Smear Review: NORMAL
WBC: 7.4 10*3/uL (ref 4.0–10.5)
nRBC: 1.5 % — ABNORMAL HIGH (ref 0.0–0.2)

## 2020-04-14 LAB — BASIC METABOLIC PANEL
Anion gap: 15 (ref 5–15)
BUN: 30 mg/dL — ABNORMAL HIGH (ref 6–20)
CO2: 18 mmol/L — ABNORMAL LOW (ref 22–32)
Calcium: 8.4 mg/dL — ABNORMAL LOW (ref 8.9–10.3)
Chloride: 100 mmol/L (ref 98–111)
Creatinine, Ser: 1.28 mg/dL — ABNORMAL HIGH (ref 0.44–1.00)
GFR calc Af Amer: 53 mL/min — ABNORMAL LOW (ref >60)
GFR calc non Af Amer: 46 mL/min — ABNORMAL LOW (ref >60)
Glucose, Bld: 153 mg/dL — ABNORMAL HIGH (ref 70–99)
Potassium: 4.5 mmol/L (ref 3.5–5.1)
Sodium: 133 mmol/L — ABNORMAL LOW (ref 135–145)

## 2020-04-14 LAB — COMPREHENSIVE METABOLIC PANEL
ALT: 38 U/L (ref 0–44)
AST: 35 U/L (ref 15–41)
Albumin: 2.3 g/dL — ABNORMAL LOW (ref 3.5–5.0)
Alkaline Phosphatase: 73 U/L (ref 38–126)
Anion gap: 15 (ref 5–15)
BUN: 32 mg/dL — ABNORMAL HIGH (ref 6–20)
CO2: 17 mmol/L — ABNORMAL LOW (ref 22–32)
Calcium: 7.9 mg/dL — ABNORMAL LOW (ref 8.9–10.3)
Chloride: 104 mmol/L (ref 98–111)
Creatinine, Ser: 1.78 mg/dL — ABNORMAL HIGH (ref 0.44–1.00)
GFR calc Af Amer: 36 mL/min — ABNORMAL LOW (ref >60)
GFR calc non Af Amer: 31 mL/min — ABNORMAL LOW (ref >60)
Glucose, Bld: 108 mg/dL — ABNORMAL HIGH (ref 70–99)
Potassium: 4.3 mmol/L (ref 3.5–5.1)
Sodium: 136 mmol/L (ref 135–145)
Total Bilirubin: 0.5 mg/dL (ref 0.3–1.2)
Total Protein: 5.4 g/dL — ABNORMAL LOW (ref 6.5–8.1)

## 2020-04-14 LAB — CBC
HCT: 42.7 % (ref 36.0–46.0)
Hemoglobin: 14.7 g/dL (ref 12.0–15.0)
MCH: 30.5 pg (ref 26.0–34.0)
MCHC: 34.4 g/dL (ref 30.0–36.0)
MCV: 88.6 fL (ref 80.0–100.0)
Platelets: 296 10*3/uL (ref 150–400)
RBC: 4.82 MIL/uL (ref 3.87–5.11)
RDW: 18.7 % — ABNORMAL HIGH (ref 11.5–15.5)
WBC: 4.7 10*3/uL (ref 4.0–10.5)
nRBC: 2.1 % — ABNORMAL HIGH (ref 0.0–0.2)

## 2020-04-14 LAB — LACTIC ACID, PLASMA
Lactic Acid, Venous: 3.7 mmol/L (ref 0.5–1.9)
Lactic Acid, Venous: 2.8 mmol/L (ref 0.5–1.9)
Lactic Acid, Venous: 4.9 mmol/L (ref 0.5–1.9)
Lactic Acid, Venous: 6.9 mmol/L (ref 0.5–1.9)
Lactic Acid, Venous: 6.1 mmol/L (ref 0.5–1.9)

## 2020-04-14 LAB — MAGNESIUM: Magnesium: 1.8 mg/dL (ref 1.7–2.4)

## 2020-04-14 LAB — LIPASE, BLOOD: Lipase: 25 U/L (ref 11–51)

## 2020-04-14 LAB — PROTIME-INR
INR: 1.1 (ref 0.8–1.2)
Prothrombin Time: 14.1 seconds (ref 11.4–15.2)

## 2020-04-14 LAB — GLUCOSE, CAPILLARY: Glucose-Capillary: 78 mg/dL (ref 70–99)

## 2020-04-14 LAB — SARS CORONAVIRUS 2 BY RT PCR (HOSPITAL ORDER, PERFORMED IN ~~LOC~~ HOSPITAL LAB): SARS Coronavirus 2: NEGATIVE

## 2020-04-14 LAB — APTT: aPTT: 28 seconds (ref 24–36)

## 2020-04-14 LAB — PROCALCITONIN: Procalcitonin: 11.81 ng/mL

## 2020-04-14 LAB — MRSA PCR SCREENING: MRSA by PCR: NEGATIVE

## 2020-04-14 MED ORDER — ELVITEG-COBIC-EMTRICIT-TENOFAF 150-150-200-10 MG PO TABS
1.0000 | ORAL_TABLET | Freq: Every day | ORAL | Status: DC
  Administered 2020-04-14: 1 via ORAL
  Filled 2020-04-14 (×2): qty 1

## 2020-04-14 MED ORDER — MIDAZOLAM HCL 5 MG/5ML IJ SOLN
INTRAMUSCULAR | Status: AC
  Filled 2020-04-14: qty 5

## 2020-04-14 MED ORDER — SODIUM CHLORIDE 0.9 % IV BOLUS
1000.0000 mL | Freq: Once | INTRAVENOUS | Status: AC
  Administered 2020-04-14: 1000 mL via INTRAVENOUS

## 2020-04-14 MED ORDER — HYDROCHLOROTHIAZIDE 25 MG PO TABS: 25.0000 mg | ORAL_TABLET | Freq: Every day | ORAL | Status: DC

## 2020-04-14 MED ORDER — PIPERACILLIN-TAZOBACTAM 4.5 G IVPB: 4.5000 g | Freq: Once | INTRAVENOUS | Status: DC

## 2020-04-14 MED ORDER — IOHEXOL 300 MG/ML  SOLN
100.0000 mL | Freq: Once | INTRAMUSCULAR | Status: AC | PRN
  Administered 2020-04-14: 100 mL via INTRAVENOUS

## 2020-04-14 MED ORDER — LEVALBUTEROL HCL 1.25 MG/0.5ML IN NEBU
1.2500 mg | INHALATION_SOLUTION | Freq: Four times a day (QID) | RESPIRATORY_TRACT | Status: DC | PRN
  Administered 2020-04-14: 1.25 mg via RESPIRATORY_TRACT
  Filled 2020-04-14: qty 0.5

## 2020-04-14 MED ORDER — PIPERACILLIN-TAZOBACTAM 3.375 G IVPB 30 MIN
3.3750 g | Freq: Three times a day (TID) | INTRAVENOUS | Status: DC
  Administered 2020-04-14: 3.375 g via INTRAVENOUS
  Filled 2020-04-14: qty 50

## 2020-04-14 MED ORDER — IPRATROPIUM-ALBUTEROL 0.5-2.5 (3) MG/3ML IN SOLN
3.0000 mL | RESPIRATORY_TRACT | Status: DC
  Administered 2020-04-14 – 2020-04-16 (×10): 3 mL via RESPIRATORY_TRACT
  Filled 2020-04-14 (×9): qty 3

## 2020-04-14 MED ORDER — LACTATED RINGERS IV BOLUS: 500.0000 mL | Freq: Once | INTRAVENOUS | Status: DC

## 2020-04-14 MED ORDER — IPRATROPIUM-ALBUTEROL 0.5-2.5 (3) MG/3ML IN SOLN
3.0000 mL | RESPIRATORY_TRACT | Status: DC | PRN
  Administered 2020-04-19: 3 mL via RESPIRATORY_TRACT
  Filled 2020-04-14: qty 3

## 2020-04-14 MED ORDER — MOMETASONE FURO-FORMOTEROL FUM 200-5 MCG/ACT IN AERO
2.0000 | INHALATION_SPRAY | Freq: Two times a day (BID) | RESPIRATORY_TRACT | Status: DC
  Administered 2020-04-14 – 2020-05-22 (×61): 2 via RESPIRATORY_TRACT
  Filled 2020-04-14 (×2): qty 8.8

## 2020-04-14 MED ORDER — ALBUTEROL SULFATE (2.5 MG/3ML) 0.083% IN NEBU
2.5000 mg | INHALATION_SOLUTION | Freq: Four times a day (QID) | RESPIRATORY_TRACT | Status: DC | PRN
  Administered 2020-04-14: 2.5 mg via RESPIRATORY_TRACT
  Filled 2020-04-14: qty 3

## 2020-04-14 MED ORDER — METRONIDAZOLE IN NACL 5-0.79 MG/ML-% IV SOLN
500.0000 mg | Freq: Three times a day (TID) | INTRAVENOUS | Status: DC
  Administered 2020-04-14 – 2020-04-15 (×4): 500 mg via INTRAVENOUS
  Filled 2020-04-14 (×7): qty 100

## 2020-04-14 MED ORDER — ACETAMINOPHEN 650 MG RE SUPP: 650.0000 mg | Freq: Four times a day (QID) | RECTAL | Status: DC | PRN

## 2020-04-14 MED ORDER — SODIUM CHLORIDE 0.9 % IV SOLN: INTRAVENOUS | Status: DC

## 2020-04-14 MED ORDER — BUDESONIDE 0.5 MG/2ML IN SUSP
0.5000 mg | Freq: Two times a day (BID) | RESPIRATORY_TRACT | Status: DC
  Administered 2020-04-14 – 2020-04-24 (×19): 0.5 mg via RESPIRATORY_TRACT
  Filled 2020-04-14 (×20): qty 2

## 2020-04-14 MED ORDER — MAGNESIUM HYDROXIDE 400 MG/5ML PO SUSP
30.0000 mL | Freq: Every day | ORAL | Status: DC | PRN
  Administered 2020-04-20 – 2020-05-03 (×2): 30 mL via ORAL
  Filled 2020-04-14 (×2): qty 30

## 2020-04-14 MED ORDER — METHYLPREDNISOLONE SODIUM SUCC 40 MG IJ SOLR
40.0000 mg | Freq: Four times a day (QID) | INTRAMUSCULAR | Status: DC
  Administered 2020-04-15 – 2020-04-22 (×31): 40 mg via INTRAVENOUS
  Filled 2020-04-14 (×32): qty 1

## 2020-04-14 MED ORDER — VALACYCLOVIR HCL 500 MG PO TABS
1000.0000 mg | ORAL_TABLET | Freq: Every day | ORAL | Status: DC
  Administered 2020-04-14: 1000 mg via ORAL
  Filled 2020-04-14 (×2): qty 2

## 2020-04-14 MED ORDER — ONDANSETRON HCL 4 MG/2ML IJ SOLN
4.0000 mg | Freq: Once | INTRAMUSCULAR | Status: AC
  Administered 2020-04-14: 4 mg via INTRAVENOUS
  Filled 2020-04-14: qty 2

## 2020-04-14 MED ORDER — TRAZODONE HCL 50 MG PO TABS: 25.0000 mg | ORAL_TABLET | Freq: Every evening | ORAL | Status: DC | PRN

## 2020-04-14 MED ORDER — HYDROCODONE-ACETAMINOPHEN 5-325 MG PO TABS
1.0000 | ORAL_TABLET | Freq: Four times a day (QID) | ORAL | Status: DC | PRN
  Administered 2020-04-14 (×2): 1 via ORAL
  Filled 2020-04-14 (×2): qty 1

## 2020-04-14 MED ORDER — HYDROMORPHONE HCL 1 MG/ML IJ SOLN
0.5000 mg | Freq: Once | INTRAMUSCULAR | Status: AC
  Administered 2020-04-14: 0.5 mg via INTRAVENOUS
  Filled 2020-04-14: qty 1

## 2020-04-14 MED ORDER — SODIUM CHLORIDE 0.9% FLUSH
5.0000 mL | Freq: Three times a day (TID) | INTRAVENOUS | Status: DC
  Administered 2020-04-14 – 2020-05-16 (×77): 5 mL

## 2020-04-14 MED ORDER — FAMOTIDINE 20 MG PO TABS
20.0000 mg | ORAL_TABLET | Freq: Every day | ORAL | Status: DC
  Administered 2020-04-14 – 2020-04-15 (×2): 20 mg via ORAL
  Filled 2020-04-14 (×3): qty 1

## 2020-04-14 MED ORDER — METHYLPREDNISOLONE SODIUM SUCC 40 MG IJ SOLR
40.0000 mg | Freq: Two times a day (BID) | INTRAMUSCULAR | Status: DC
  Administered 2020-04-14: 40 mg via INTRAVENOUS
  Filled 2020-04-14: qty 1

## 2020-04-14 MED ORDER — MORPHINE SULFATE (PF) 4 MG/ML IV SOLN
4.0000 mg | Freq: Once | INTRAVENOUS | Status: AC
  Administered 2020-04-14: 4 mg via INTRAVENOUS
  Filled 2020-04-14: qty 1

## 2020-04-14 MED ORDER — IPRATROPIUM BROMIDE 0.02 % IN SOLN
0.5000 mg | Freq: Four times a day (QID) | RESPIRATORY_TRACT | Status: DC
  Administered 2020-04-14: 0.5 mg via RESPIRATORY_TRACT
  Filled 2020-04-14: qty 2.5

## 2020-04-14 MED ORDER — LEVALBUTEROL HCL 1.25 MG/0.5ML IN NEBU
1.2500 mg | INHALATION_SOLUTION | Freq: Four times a day (QID) | RESPIRATORY_TRACT | Status: DC
  Administered 2020-04-14: 1.25 mg via RESPIRATORY_TRACT
  Filled 2020-04-14: qty 0.5

## 2020-04-14 MED ORDER — HYDROCODONE-ACETAMINOPHEN 5-325 MG PO TABS
1.0000 | ORAL_TABLET | Freq: Four times a day (QID) | ORAL | Status: DC | PRN
  Administered 2020-04-21 – 2020-04-26 (×11): 2 via ORAL
  Filled 2020-04-14 (×11): qty 2

## 2020-04-14 MED ORDER — PIPERACILLIN-TAZOBACTAM 3.375 G IVPB 30 MIN
3.3750 g | Freq: Once | INTRAVENOUS | Status: AC
  Administered 2020-04-14: 3.375 g via INTRAVENOUS
  Filled 2020-04-14: qty 50

## 2020-04-14 MED ORDER — ENOXAPARIN SODIUM 40 MG/0.4ML ~~LOC~~ SOLN
40.0000 mg | SUBCUTANEOUS | Status: DC
  Administered 2020-04-14 – 2020-04-15 (×2): 40 mg via SUBCUTANEOUS
  Filled 2020-04-14 (×2): qty 0.4

## 2020-04-14 MED ORDER — FENTANYL CITRATE (PF) 100 MCG/2ML IJ SOLN
25.0000 ug | INTRAMUSCULAR | Status: AC
  Administered 2020-04-14: 25 ug via INTRAVENOUS
  Filled 2020-04-14: qty 2

## 2020-04-14 MED ORDER — LACTATED RINGERS IV BOLUS (SEPSIS)
1000.0000 mL | Freq: Once | INTRAVENOUS | Status: AC
  Administered 2020-04-14: 1000 mL via INTRAVENOUS

## 2020-04-14 MED ORDER — LACTATED RINGERS IV BOLUS (SEPSIS)
200.0000 mL | Freq: Once | INTRAVENOUS | Status: AC
  Administered 2020-04-14: 200 mL via INTRAVENOUS

## 2020-04-14 MED ORDER — IOHEXOL 300 MG/ML  SOLN
75.0000 mL | Freq: Once | INTRAMUSCULAR | Status: AC | PRN
  Administered 2020-04-14: 75 mL via INTRAVENOUS

## 2020-04-14 MED ORDER — ACETAMINOPHEN 325 MG PO TABS
650.0000 mg | ORAL_TABLET | Freq: Four times a day (QID) | ORAL | Status: DC | PRN
  Administered 2020-04-14 – 2020-05-19 (×9): 650 mg via ORAL
  Filled 2020-04-14 (×11): qty 2

## 2020-04-14 MED ORDER — HYDROMORPHONE HCL 1 MG/ML IJ SOLN
0.5000 mg | INTRAMUSCULAR | Status: DC | PRN
  Administered 2020-04-14: 0.5 mg via INTRAVENOUS
  Administered 2020-04-14 – 2020-04-15 (×6): 1 mg via INTRAVENOUS
  Administered 2020-04-16: 0.5 mg via INTRAVENOUS
  Administered 2020-04-25 – 2020-05-06 (×14): 1 mg via INTRAVENOUS
  Filled 2020-04-14 (×25): qty 1

## 2020-04-14 MED ORDER — LOSARTAN POTASSIUM 50 MG PO TABS: 50.0000 mg | ORAL_TABLET | Freq: Every day | ORAL | Status: DC

## 2020-04-14 MED ORDER — LACTATED RINGERS IV BOLUS
1000.0000 mL | Freq: Once | INTRAVENOUS | Status: AC
  Administered 2020-04-14: 1000 mL via INTRAVENOUS

## 2020-04-14 MED ORDER — AMLODIPINE BESYLATE 5 MG PO TABS: 10.0000 mg | ORAL_TABLET | Freq: Every day | ORAL | Status: DC

## 2020-04-14 MED ORDER — CHLORHEXIDINE GLUCONATE CLOTH 2 % EX PADS
6.0000 | MEDICATED_PAD | Freq: Every day | CUTANEOUS | Status: DC
  Administered 2020-04-14 – 2020-05-17 (×25): 6 via TOPICAL

## 2020-04-14 MED ORDER — PANTOPRAZOLE SODIUM 40 MG PO TBEC
40.0000 mg | DELAYED_RELEASE_TABLET | Freq: Every day | ORAL | Status: DC
  Filled 2020-04-14: qty 1

## 2020-04-14 MED ORDER — FENTANYL CITRATE (PF) 100 MCG/2ML IJ SOLN
INTRAMUSCULAR | Status: AC
  Filled 2020-04-14: qty 2

## 2020-04-14 MED ORDER — SODIUM CHLORIDE 0.9 % IV SOLN
2.0000 g | INTRAVENOUS | Status: DC
  Administered 2020-04-14 – 2020-04-15 (×2): 2 g via INTRAVENOUS
  Filled 2020-04-14: qty 2
  Filled 2020-04-14: qty 20

## 2020-04-14 MED ORDER — LABETALOL HCL 5 MG/ML IV SOLN
20.0000 mg | INTRAVENOUS | Status: DC | PRN
  Administered 2020-04-17 – 2020-05-05 (×2): 20 mg via INTRAVENOUS
  Filled 2020-04-14 (×3): qty 4

## 2020-04-14 MED ORDER — IPRATROPIUM-ALBUTEROL 0.5-2.5 (3) MG/3ML IN SOLN: 3.0000 mL | RESPIRATORY_TRACT | Status: DC | PRN

## 2020-04-14 NOTE — ED Provider Notes (Signed)
[facility] Emergency Department Provider Note   ____________________________________________   None    (approximate)  I have reviewed the triage vital signs and the nursing notes.   HISTORY  Chief Complaint Abdominal Pain    HPI Melanie Bowen is a 59 y.o. female with past medical history of hypertension, asthma, and HIV (last CD4 329) who presents to the ED complaining of abdominal pain.  Patient reports that she was seen in the ED last week for lower abdominal pain and diagnosed with diverticulitis at that time.  She was started on a course of Augmentin and states she had completed this, was feeling better than until her pain significantly worsened throughout the day today.  She reports feeling nauseous but has not vomited, denies any fevers.  She has not had any changes in her bowel movements and denies any dysuria or hematuria.  She was initially given Vicodin for her pain but states this did not help very much and she is now in severe pain.        Past Medical History:  Diagnosis Date  . Acute GI hemorrhage 12/2015   required transfusion  . Allergic rhinitis   . Arthritis    DDD  . Asthma   . Blepharitis   . Bronchitis   . Cigarette smoker   . Claustrophobia   . COPD (chronic obstructive pulmonary disease) ([place])   . Cough   . DDD (degenerative disc disease), lumbar 04/10/2014  . Depression   . Diverticulosis   . Ectopic pregnancy   . Eye irritation   . Genital herpes   . GERD (gastroesophageal reflux disease)   . Headache   . HIV (human immunodeficiency virus infection) ([place])   . Hypertension   . Paresthesia of hand, bilateral   . Shortness of breath dyspnea    at times due to asthma    Patient Active Problem List   Diagnosis Date Noted  . Multifocal pneumonia 11/06/2019  . AKI (acute kidney injury) ([place]) 11/06/2019  . Leukocytosis 11/06/2019  . Diverticulitis large intestine 01/17/2019  . Violation of controlled substance  agreement 12/05/2017  . Positive urine drug screen (+cocaine) 12/05/2017  . Cervicalgia 12/05/2017  . Drug-seeking behavior 12/05/2017  . Community acquired pneumonia 11/28/2016  . Pneumonia 11/28/2016  . Allergic rhinitis 05/27/2016  . Tobacco abuse counseling 02/11/2016  . Symptomatic anemia   . Lower GI bleed   . Anemia 01/16/2016  . GI bleed 01/10/2016  . GERD (gastroesophageal reflux disease) 01/10/2016  . HTN (hypertension) 01/10/2016  . Depression 01/10/2016  . HIV (human immunodeficiency virus infection) ([place]) 01/10/2016  . Lumbar pseudoarthrosis 10/31/2015  . Chronic cough   . Cough 06/19/2015  . DDD (degenerative disc disease), lumbar 12/31/2014  . Degenerative disc disease, lumbar 12/31/2014  . Asthma, chronic 11/12/2014    Past Surgical History:  Procedure Laterality Date  . BACK SURGERY  12/2014   lumbar lam. Dr. Deri Fuelling  . BACK SURGERY  12/25/2012   Removal lumbosubarachnoid shunt system, L5-S1 TF ESI, Dr. Virl Axe Minchew  . back surgery may 2016     lumbar   . BUNIONECTOMY Bilateral    feet  . COLONOSCOPY  12/06/2014   rescheduled from 11/01/2014 due to poor prep  . DIAGNOSTIC LAPAROSCOPY    . DISTAL FEMUR OSTEOTOMY Right    Dr. Earnestine Leys  . ESOPHAGOGASTRODUODENOSCOPY Left 01/11/2016   Procedure: ESOPHAGOGASTRODUODENOSCOPY (EGD);  Surgeon: Hulen Luster, MD;  Location: [facility] ENDOSCOPY;  Service: Endoscopy;  Laterality: Left;  . Finger fracture Right    5th finger  . FRACTURE SURGERY Right 01/25/2014   closed reduction & percutaneous pinning of 5th digit  . HAND SURGERY Bilateral    carpal tunnel  . JOINT REPLACEMENT Bilateral    knees  . KNEE SURGERY Bilateral 2003,2007   total Joint  . LAPAROSCOPY FOR ECTOPIC PREGNANCY    . PERIPHERAL VASCULAR CATHETERIZATION N/A 01/12/2016   Procedure: Visceral Angiography;  Surgeon: Algernon Huxley, MD;  Location: [facility];  Service: Cardiovascular;  Laterality: N/A;  . PERIPHERAL VASCULAR  CATHETERIZATION N/A 01/12/2016   Procedure: Visceral Artery Intervention;  Surgeon: Algernon Huxley, MD;  Location: [place] [facility];  Service: Cardiovascular;  Laterality: N/A;  . REVISION TOTAL KNEE ARTHROPLASTY Right 12/31/2002   Dr. Marry Guan  . TUBAL LIGATION    . VIDEO BRONCHOSCOPY N/A 06/30/2015   Procedure: VIDEO BRONCHOSCOPY WITHOUT FLUORO;  Surgeon: Vilinda Boehringer, MD;  Location: [facility] ORS;  Service: Cardiopulmonary;  Laterality: N/A;    Prior to Admission medications   Medication Sig Start Date End Date Taking? Authorizing Provider  albuterol (VENTOLIN HFA) 108 (90 Base) MCG/ACT inhaler Inhale 2 puffs into the lungs every 6 (six) hours as needed for wheezing or shortness of breath. 05/21/19   Laban Emperor, PA-C  amLODipine (NORVASC) 10 MG tablet Take 10 mg by mouth daily.     [provider]  DEXILANT 60 MG capsule Take 60 mg by mouth daily. 04/25/19   [provider]  elvitegravir-cobicistat-emtricitabine-tenofovir (GENVOYA) 150-150-200-10 MG TABS tablet Take 1 tablet by mouth at bedtime.    [provider]  famotidine (PEPCID) 20 MG tablet Take 20 mg by mouth daily.  05/08/19   [provider]  hydrochlorothiazide (HYDRODIURIL) 25 MG tablet Take 25 mg by mouth daily.    [provider]  HYDROcodone-acetaminophen (NORCO/VICODIN) 5-325 MG tablet Take 1 tablet by mouth every 6 (six) hours as needed for severe pain. 04/04/20   Lavonia Drafts, MD  losartan (COZAAR) 50 MG tablet Take 50 mg by mouth daily.    [provider]  valACYclovir (VALTREX) 1000 MG tablet Take 1,000 mg by mouth at bedtime.  04/25/19   [provider]  Grant Ruts INHUB 250-50 MCG/DOSE AEPB Inhale 1 puff into the lungs 2 (two) times daily. 11/05/19   [provider]    Allergies Acetaminophen, Ibuprofen, Gabapentin, Morphine, Morphine and related, and Zanaflex  [tizanidine]  Family History  Problem Relation Age of Onset  . Breast cancer Sister      Social History Social History   Tobacco Use  . Smoking status: Current Some Day Smoker    Packs/day: 0.25    Years: 38.00    Pack years: 9.50    Types: Cigarettes  . Smokeless tobacco: Never Used  . Tobacco comment: 1 cig daily  Vaping Use  . Vaping Use: Never used  Substance Use Topics  . Alcohol use: Yes    Alcohol/week: 5.0 standard drinks    Types: 5 Glasses of wine per week    Comment: occ  . Drug use: Not Currently    Review of Systems  Constitutional: No fever/chills Eyes: No visual changes. ENT: No sore throat. Cardiovascular: Denies chest pain. Respiratory: Denies shortness of breath. Gastrointestinal: Positive for abdominal pain.  Positive for nausea, no vomiting.  No diarrhea.  No constipation. Genitourinary: Negative for dysuria. Musculoskeletal: Negative for back pain. Skin: Negative for rash. Neurological: Negative for headaches, focal weakness or numbness.  ____________________________________________  PHYSICAL EXAM:  VITAL SIGNS: ED Triage Vitals  Enc Vitals Group     BP 04/13/20 2202 (!) 151/98     Pulse Rate 04/13/20 2202 93     Resp 04/13/20 2202 20     Temp 04/13/20 2202 (!) 97.5 F (36.4 C)     Temp src --      SpO2 04/13/20 2202 97 %     Weight 04/13/20 2127 260 lb (117.9 kg)     Height 04/13/20 2127 5\' 9"  (1.753 m)     Head Circumference --      Peak Flow --      Pain Score 04/13/20 2127 10     Pain Loc --      Pain Edu? --      Excl. in [place]? --     Constitutional: Alert and oriented. Eyes: Conjunctivae are normal. Head: Atraumatic. Nose: No congestion/rhinnorhea. Mouth/Throat: Mucous membranes are moist. Neck: Normal ROM Cardiovascular: Normal rate, regular rhythm. Grossly normal heart sounds. Respiratory: Normal respiratory effort.  No retractions. Lungs CTAB. Gastrointestinal: Soft and diffusely tender to palpation with no rebound or guarding. No distention. Genitourinary: deferred Musculoskeletal: No lower extremity  tenderness nor edema. Neurologic:  Normal speech and language. No gross focal neurologic deficits are appreciated. Skin:  Skin is warm, dry and intact. No rash noted. Psychiatric: Mood and affect are normal. Speech and behavior are normal.  ____________________________________________   LABS (all labs ordered are listed, but only abnormal results are displayed)  Labs Reviewed  COMPREHENSIVE METABOLIC PANEL - Abnormal; Notable for the following components:      Result Value   Glucose, Bld 125 (*)    BUN 25 (*)    Calcium 8.6 (*)    Total Protein 6.4 (*)    Albumin 2.8 (*)    All other components within normal limits  CBC - Abnormal; Notable for the following components:   RDW 18.4 (*)    nRBC 0.8 (*)    All other components within normal limits  URINALYSIS, COMPLETE (UACMP) WITH MICROSCOPIC - Abnormal; Notable for the following components:   Color, Urine YELLOW (*)    APPearance CLEAR (*)    All other components within normal limits  SARS CORONAVIRUS 2 BY RT PCR (HOSPITAL ORDER, [place] [facility])  LIPASE, BLOOD     PROCEDURES  Procedure(s) performed (including Critical Care):  Procedures   ____________________________________________   INITIAL IMPRESSION / ASSESSMENT AND PLAN / ED COURSE       59 year old female with past medical history of hypertension, asthma, and HIV who presents to the ED complaining of worsening diffuse abdominal pain throughout the day today after recently being diagnosed with diverticulitis.  CT scan was performed from triage and is positive for diverticulitis with contained perforation and associated large abscess.  Vital signs and labs are reassuring, although patient is quite uncomfortable at this time.  We will treat her with IV morphine and Zofran, hydrate with IV fluids and start IV Zosyn.  Case discussed with Dr. Christian Mate of general surgery, who anticipates drainage with IR tomorrow and will follow during admission.   Case discussed with hospitalist for admission.      ____________________________________________   FINAL CLINICAL IMPRESSION(S) / ED DIAGNOSES  Final diagnoses:  Diverticulitis of large intestine with abscess without bleeding     ED Discharge Orders    None       Note:  This document was prepared using Dragon voice recognition software and may include  unintentional dictation errors.   Blake Divine, MD 04/14/20 [id number]

## 2020-04-14 NOTE — ED Notes (Signed)
Patient tolerated po and IV pain medications well. Water removed from bedside as patient is NPO. Patient continues to rate pain as a 10/10. Repositioned in bed. Bed in lowest position and locked. Patient has cell phone with her but is not taking calls at this time. Lights out in room for patient comfort.

## 2020-04-14 NOTE — ED Notes (Signed)
Date and time results received: 04/14/20 3:51 PM  (use smartphrase ".now" to insert current time)  Test: Lactic Critical Value: 4.9  Name of Provider Notified: Dr. Manuella Ghazi  Orders Received? Or Actions Taken?: Critical Results Acknowledged

## 2020-04-14 NOTE — ED Notes (Signed)
Pt transported to CT at this time.

## 2020-04-14 NOTE — ED Notes (Signed)
Admitting MD and Surgery made aware that patient c/o worsening mid abdominal pain swelling at this time. Pt states pain worse with palpation.

## 2020-04-14 NOTE — Progress Notes (Signed)
Patient remains critically sick with worsening lactic acidosis (2.8->4.9). hypotensive, diaphoretic   I've asked the RN to continue sepsis protocol and get her in ICU ASAP.  Dr Mortimer Fries & surgical team is aware.  High risk for worsening clinical condition, acute cardio-respi failure, multi-organ failure and possible death from sepsis due to underlying diverticular abscess.  Surgeon to decide need for surgical intervention.  Time spent: 35 mins

## 2020-04-14 NOTE — ED Notes (Signed)
Pt refusing BiPap at this time.

## 2020-04-14 NOTE — Progress Notes (Signed)
Contacted by Darel Hong, NP regarding patient's worsening lactic acidosis.  On prior CT, no concern for bowel ischemia.  Source control should be adequate with drain placed today, however patient condition is deteriorating.  Suggested repeat CT scan with IV contrast. Despite patient renal function also worsening, little additional information regarding the status of her bowel is likely to be gained with a non-contrast study.  Advised bicarbonate infusion pre- and post-scan. I suspect her respiratory status may be contributing to low systemic oxygenation, however some antiretroviral agents, such as Genvoya (which the patient takes) can induce mitochondrial dysfunction and subsequent lactic acidosis.

## 2020-04-14 NOTE — ED Notes (Signed)
Lab at bedside to recollect labs.

## 2020-04-14 NOTE — ED Notes (Signed)
Richrd Sox, Surgical PA at bedside at this time to assess patient.

## 2020-04-14 NOTE — ED Notes (Signed)
Lab called to recollect lactic.

## 2020-04-14 NOTE — ED Notes (Signed)
Pt reports hx of diverticulosis but "never like this", lower abdominal pain left side greater, worse with palpationg  Audible wheeze, pt reports 1 ppd smoker  Pt reports HIV status as now "undetectable"

## 2020-04-14 NOTE — Consult Note (Addendum)
Name: Melanie Bowen MRN: [id number] DOB: 03-Nov-1960    ADMISSION DATE:  04/14/2020 CONSULTATION DATE:  04/14/2020  REFERRING MD :  Dr. Manuella Ghazi  CHIEF COMPLAINT:  Abdominal Pain  BRIEF PATIENT DESCRIPTION:  Melanie Bowen is a 59 y.o. Female with a past medical history significant for COPD/Asthma, HIV, Diverticulosis, Hypertension, GERD, and depression who is admitted to ICU due to Sepsis in the setting of Acute Sigmoid Diverticulitis w/ Diverticular Abscess.  General Surgery is following, IR placed Percutaneous Drain on 04/14/20.  SIGNIFICANT EVENTS  8/16: To be admitted to [facility] by Hospitalist; General Surgery consulted 8/16: IR placed CT guided Percutaneous drain to diverticular abscess 8/16: PCCM consulted due to worsening lactic acidosis & concerning for worsening sepsis 8/16: Pt refusing BiPAP; Pt made herself DNI  STUDIES:  8/16: CT Abdomen & Pelvis>>1. Findings consistent with acute sigmoid colon diverticulitis. 7.5 x 4.4 cm gas and fluid collection within the anterior pelvis suspicious for contained perforation/abscess, this appears to communicate with the thickened inflamed sigmoid colon. 2. Small amount of upper abdominal and pelvic ascites. 3. Thickened appearance of pelvic small bowel loops, likely reactive 8/16: CXR>>No evidence of acute disease. 8/16: CT Abdomen & Pelvis>>1. No significant interval change in short interval follow-up. 2. Contained sigmoid colon perforation with abscess formation. Perforation presumed related to diverticulitis. Findings similar to May 2020 with increased volume of contained perforation. 3. No intraperitoneal free air. 4. Secondary inflammation of the small bowel. 8/16: KUB>>The bowel gas pattern is normal. No intraperitoneal free air peer no radio-opaque calculi or other significant radiographic abnormality are seen. No acute osseous abnormality. Prior lumbar fusion.  CULTURES: SARS-CoV-2 PCR 8/16>> negative Blood culture x2  8/16>> Aerobic/Anaerobic culture from abscess 8/16>> MRSA PCR 8/16>> negative  ANTIBIOTICS: Zosyn 8/16>>8/16 Ceftriaxone 8/16>> Flagyl 8/16>> Valacyclovir (pt's outpatient med for HIV)  8/16>>  HISTORY OF PRESENT ILLNESS:   Melanie Bowen is a 59 y.o. Female with a past medical history significant for COPD/Asthma, HIV, Diverticulosis, Hypertension, GERD, and depression who presented to [facility] ED on 04/13/20 due to acute onset of worsening of left lower quadrant abdominal pain with associated nausea.  She reported she began having pain 9 days ago and was seen in the ED and discharged home with PO Augmentin, which did help improve her symptoms until her pain worsened yesterday.  She denies vomiting, chest pain, palpitations, cough, wheezing, shortness of breath, dysuria, or hematuria.  Initial workup in the ED revealed Albumin 2.8, glucose 125, WBC 7.8. Urinalysis is unremarkable.  CT Abdomen & Pelvis showed acute sigmoid diverticulitis with gas and fluid collection in the anterior pelvis suspicious for contained preforation/abscess.  She met Sepsis criteria, therefore she was given IV fluids and placed on IV Zosyn.  General Surgery was consulted.  She was to be admitted to the [facility] unit by the Hospitalist for further workup and treatment of Sepsis in the setting of Acute Sigmoid Diverticulitis w/ Diverticular Abscess.  Later in the morning on 04/14/20, IR placed a CT guided Percutaneous drain to the abscess.  While awaiting bed placement in the ED, she was noted to have hypotension (improved with IV Fluids), tachycardia, and developing Acute Kidney Injury.  PCCM was consulted for further assistance in sepsis management.  PAST MEDICAL HISTORY :   has a past medical history of Acute GI hemorrhage (12/2015), Allergic rhinitis, Arthritis, Asthma, Blepharitis, Bronchitis, Cigarette smoker, Claustrophobia, COPD (chronic obstructive pulmonary disease) ([place]), Cough, DDD (degenerative disc disease), lumbar  (04/10/2014), Depression, Diverticulosis, Ectopic pregnancy, Eye irritation, Genital herpes,  GERD (gastroesophageal reflux disease), Headache, HIV (human immunodeficiency virus infection) ([place]), Hypertension, Paresthesia of hand, bilateral, and Shortness of breath dyspnea.  has a past surgical history that includes Knee surgery (Bilateral, [id number]); Hand surgery (Bilateral); Tubal ligation; Laparoscopy for ectopic pregnancy; Bunionectomy (Bilateral); Finger fracture (Right); back surgery may 2016; Video bronchoscopy (N/A, 06/30/2015); Diagnostic laparoscopy; Cardiac catheterization (N/A, 01/12/2016); Cardiac catheterization (N/A, 01/12/2016); Esophagogastroduodenoscopy (Left, 01/11/2016); Fracture surgery (Right, 01/25/2014); Colonoscopy (12/06/2014); Joint replacement (Bilateral); Back surgery (12/2014); Back surgery (12/25/2012); Revision total knee arthroplasty (Right, 12/31/2002); and Distal femur osteotomy (Right). Prior to Admission medications   Medication Sig Start Date End Date Taking? Authorizing Provider  amLODipine (NORVASC) 10 MG tablet Take 10 mg by mouth daily.    Yes [provider]  DEXILANT 60 MG capsule Take 60 mg by mouth daily. 04/25/19  Yes [provider]  gabapentin (NEURONTIN) 600 MG tablet Take 600 mg by mouth 3 (three) times daily. 04/02/20  Yes [provider]  hydrochlorothiazide (HYDRODIURIL) 25 MG tablet Take 25 mg by mouth daily.   Yes [provider]  HYDROcodone-acetaminophen (NORCO/VICODIN) 5-325 MG tablet Take 1 tablet by mouth every 6 (six) hours as needed for severe pain. 04/04/20  Yes Lavonia Drafts, MD  losartan (COZAAR) 50 MG tablet Take 50 mg by mouth daily.   Yes [provider]  methocarbamol (ROBAXIN) 500 MG tablet Take 500 mg by mouth in the morning, at noon, in the evening, and at bedtime. 03/31/20  Yes [provider]  Grant Ruts INHUB 250-50 MCG/DOSE AEPB Inhale 1 puff into the lungs 2 (two) times daily. 11/05/19  Yes  [provider]  albuterol (VENTOLIN HFA) 108 (90 Base) MCG/ACT inhaler Inhale 2 puffs into the lungs every 6 (six) hours as needed for wheezing or shortness of breath. 05/21/19   Laban Emperor, PA-C  elvitegravir-cobicistat-emtricitabine-tenofovir (GENVOYA) 150-150-200-10 MG TABS tablet Take 1 tablet by mouth at bedtime.    [provider]  famotidine (PEPCID) 20 MG tablet Take 20 mg by mouth daily.  05/08/19   [provider]  traMADol (ULTRAM) 50 MG tablet Take 50 mg by mouth every 6 (six) hours as needed. 04/03/20   [provider]  valACYclovir (VALTREX) 1000 MG tablet Take 1,000 mg by mouth at bedtime.  04/25/19   [provider]   Allergies  Allergen Reactions  . Acetaminophen Nausea And Vomiting  . Ibuprofen Other (See Comments)    Reports causes bleeding  . Gabapentin Rash  . Morphine Itching and Rash  . Morphine And Related Itching  . Zanaflex  [Tizanidine] Rash    FAMILY HISTORY:  family history includes Breast cancer in her sister. SOCIAL HISTORY:  reports that she has been smoking cigarettes. She has a 9.50 pack-year smoking history. She has never used smokeless tobacco. She reports current alcohol use of about 5.0 standard drinks of alcohol per week. She reports previous drug use.   COVID-19 DISASTER DECLARATION:  FULL CONTACT PHYSICAL EXAMINATION WAS NOT POSSIBLE DUE TO TREATMENT OF COVID-19 AND  CONSERVATION OF PERSONAL PROTECTIVE EQUIPMENT, LIMITED EXAM FINDINGS INCLUDE-  Patient assessed or the symptoms described in the history of present illness.  In the context of the Global COVID-19 pandemic, which necessitated consideration that the patient might be at risk for infection with the SARS-CoV-2 virus that causes COVID-19, Institutional protocols and algorithms that pertain to the evaluation of patients at risk for COVID-19 are in a state of rapid change based on information released by regulatory bodies including the CDC and  federal and state organizations. These policies and algorithms were followed during the patient's care while in hospital.  REVIEW OF SYSTEMS:  Positives in BOLD Constitutional: Negative for fever, chills, weight loss, malaise/fatigue and diaphoresis.  HENT: Negative for hearing loss, ear pain, nosebleeds, congestion, sore throat, neck pain, tinnitus and ear discharge.   Eyes: Negative for blurred vision, double vision, photophobia, pain, discharge and redness.  Respiratory: Negative for cough, hemoptysis, sputum production, shortness of breath, +wheezing and stridor.   Cardiovascular: Negative for chest pain, palpitations, orthopnea, claudication, leg swelling and PND.  Gastrointestinal: Negative for heartburn, nausea, vomiting, +abdominal pain, diarrhea, constipation, blood in stool and melena.  Genitourinary: Negative for dysuria, urgency, frequency, hematuria and flank pain.  Musculoskeletal: Negative for myalgias, back pain, joint pain and falls.  Skin: Negative for itching and rash.  Neurological: Negative for dizziness, tingling, tremors, sensory change, speech change, focal weakness, seizures, loss of consciousness, weakness and headaches.  Endo/Heme/Allergies: Negative for environmental allergies and polydipsia. Does not bruise/bleed easily.  SUBJECTIVE:  Complains of abdominal pain (worse with palpitation) and wheezing No guarding, no abdominal rigidity, or rebound tenderness Denies chest pain, palpitations, SOB, fever/chills, N/V/D On 3L [place] Wanting to drink broth  VITAL SIGNS: Temp:  [97.5 F (36.4 C)-97.9 F (36.6 C)] 97.5 F (36.4 C) (08/16 1822) Pulse Rate:  [33-138] 114 (08/16 1822) Resp:  [20-35] 20 (08/16 1822) BP: (73-151)/(25-98) 103/61 (08/16 1822) SpO2:  [92 %-100 %] 97 % (08/16 1822) Weight:  [117.9 kg-121.6 kg] 121.6 kg (08/16 1822)  PHYSICAL EXAMINATION: General:  Acutely ill appearing female, laying in bed, on 3L [place], with abdominal pain Neuro:  Awake, A&O  x3, follows commands, no focal deficits, speech clear, pupils PERRLA HEENT:  Atraumatic, normocephalic, neck supple, no JVD Cardiovascular:  Tachycardia, regular rhythm, s1s2, no M/R/G Lungs:  Expiratory wheezing throughout, no crackles, tachypnea,  Abdomen:  Soft, tender, no rigidity, no guarding or rebound tenderness, BS+ x4 Musculoskeletal:  Normal bulk and tone, no deformities, no edema Skin: Warm and dry.  No obvious rashes, lesions, or ulcerations  Recent Labs  Lab 04/13/20 2140 04/14/20 0648 04/14/20 1245  NA 135 133* 136  K 3.8 4.5 4.3  CL 101 100 104  CO2 23 18* 17*  BUN 25* 30* 32*  CREATININE 0.80 1.28* 1.78*  GLUCOSE 125* 153* 108*   Recent Labs  Lab 04/13/20 2140 04/14/20 0648 04/14/20 1245  HGB 13.4 14.7 13.7  HCT 40.4 42.7 41.7  WBC 7.8 4.7 7.4  PLT 354 296 294   CT ABDOMEN PELVIS WO CONTRAST  Result Date: 04/14/2020 CLINICAL DATA:  Abdominal pain. Contained perforated acute diverticulitis. EXAM: CT ABDOMEN AND PELVIS WITHOUT CONTRAST TECHNIQUE: Multidetector CT imaging of the abdomen and pelvis was performed following the standard protocol without IV contrast. COMPARISON:  CT 04/14/2020, 01/31/2019 FINDINGS: Lower chest: Lung bases are clear. Hepatobiliary: No focal hepatic lesion. No biliary duct dilatation. Common bile duct is normal. Pancreas: Pancreas is normal. No ductal dilatation. No pancreatic inflammation. Spleen: Normal spleen Adrenals/urinary tract: Adrenal glands and kidneys are normal. The ureters and bladder normal. Stomach/Bowel: Stomach duodenum normal. Mildly thickened loops small bowel in the pelvis is favored secondary inflammation related to pelvic abscess. Appendix is partially imaged (image 61/2) appears normal. The ascending and transverse colon normal. There are diverticula descending colon and sigmoid colon. Diverticular disease is heavy in the proximal sigmoid colon. There is a thick-walled fluid collection with the an air-fluid level  measuring 8.1 x 4.7 cm in the central anterior upper  pelvis. This is adjacent to the sigmoid colon. There is a thin tract between the colon in the fluid collection on image 71/2. Similar pericolonic abscess seen on CT 01/17/2019. Findings most consistent with contained sigmoid perforation with abscess formation. No intraperitoneal free.  Minimal fluid in the pelvis. Vascular/Lymphatic: Abdominal aorta is normal caliber with atherosclerotic calcification. There is no retroperitoneal or periportal lymphadenopathy. No pelvic lymphadenopathy. Reproductive: Post hysterectomy.  Adnexa unremarkable Other: Small volume free fluid the pelvis Musculoskeletal: Posterior lumbar fusion IMPRESSION: 1. No significant interval change in short interval follow-up. 2. Contained sigmoid colon perforation with abscess formation. Perforation presumed related to diverticulitis. Findings similar to May 2020 with increased volume of contained perforation. 3. No intraperitoneal free air. 4. Secondary inflammation of the small bowel. Electronically Signed   By: Suzy Bouchard M.D.   On: 04/14/2020 08:08   DG Chest 1 View  Result Date: 04/14/2020 CLINICAL DATA:  Dyspnea EXAM: CHEST  1 VIEW COMPARISON:  11/06/2019 FINDINGS: Mildly low lung volumes. There is no edema, consolidation, effusion, or pneumothorax. Normal heart size and mediastinal contours. Artifact from EKG leads IMPRESSION: No evidence of acute disease. Electronically Signed   By: Monte Fantasia M.D.   On: 04/14/2020 07:17   DG Abd 1 View  Result Date: 04/14/2020 CLINICAL DATA:  Abdominal pain.  Pelvic abscess drainage today. EXAM: ABDOMEN - 1 VIEW COMPARISON:  CT abdomen pelvis from same day. FINDINGS: The bowel gas pattern is normal. No intraperitoneal free air peer no radio-opaque calculi or other significant radiographic abnormality are seen. No acute osseous abnormality. Prior lumbar fusion. IMPRESSION: 1. Negative. Electronically Signed   By: Titus Dubin M.D.    On: 04/14/2020 14:37   CT Abdomen Pelvis W Contrast  Result Date: 04/14/2020 CLINICAL DATA:  Lower abdominal pain EXAM: CT ABDOMEN AND PELVIS WITH CONTRAST TECHNIQUE: Multidetector CT imaging of the abdomen and pelvis was performed using the standard protocol following bolus administration of intravenous contrast. CONTRAST:  112m OMNIPAQUE IOHEXOL 300 MG/ML  SOLN COMPARISON:  01/31/2019 FINDINGS: Lower chest: Lung bases demonstrate no acute consolidation or pleural effusion. Normal cardiac size. Hepatobiliary: No focal liver abnormality is seen. No gallstones, gallbladder wall thickening, or biliary dilatation. Pancreas: Unremarkable. No pancreatic ductal dilatation or surrounding inflammatory changes. Spleen: Normal in size without focal abnormality. Adrenals/Urinary Tract: Adrenal glands are unremarkable. Kidneys are normal, without renal calculi, focal lesion, or hydronephrosis. Bladder is unremarkable. Stomach/Bowel: The stomach is nonenlarged. No dilated small bowel. Mildly thickened loops of small bowel within the pelvis, likely reactive. Negative appendix. Sigmoid colon diverticular disease. Wall thickening and mild inflammatory change at the distal sigmoid colon consistent with diverticulitis. 7.5 x 4.4 cm gas and fluid collection within the anterior pelvis suspicious for contained perforation/abscess. Soft tissue inflammatory density extends from inflamed sigmoid colon to the gas and fluid collection, sagittal series 6, image number 96. Vascular/Lymphatic: Moderate aortic atherosclerosis. No aneurysm. No suspicious nodes. Reproductive: Uterus and bilateral adnexa are unremarkable. Other: No free air. Small free fluid in the pelvis. Small amount of upper abdominal ascites. Generalized soft tissue stranding within the pelvis consistent with inflammatory process. Musculoskeletal: Postsurgical changes of the lumbar spine with surgical rod and fixating screws L3 through L5. No acute osseous abnormality  IMPRESSION: 1. Findings consistent with acute sigmoid colon diverticulitis. 7.5 x 4.4 cm gas and fluid collection within the anterior pelvis suspicious for contained perforation/abscess, this appears to communicate with the thickened inflamed sigmoid colon. 2. Small amount of upper abdominal and pelvic ascites. 3. Thickened  appearance of pelvic small bowel loops, likely reactive Aortic Atherosclerosis (ICD10-I70.0). Electronically Signed   By: Donavan Foil M.D.   On: 04/14/2020 01:27   CT IMAGE GUIDED DRAINAGE PERCUT CATH  PERITONEAL RETROPERIT  Result Date: 04/14/2020 INDICATION: 59 year old with air-fluid collection in the lower abdomen / upper pelvic region. Findings are suggestive for a colonic diverticular abscess. Patient needs CT-guided drainage. EXAM: CT-GUIDED DRAINAGE OF PELVIC ABSCESS MEDICATIONS: Fentanyl 25 mcg ANESTHESIA/SEDATION: The patient was continuously monitored during the procedure by the interventional radiology nurse under my direct supervision. COMPLICATIONS: None immediate. PROCEDURE: Informed written consent was obtained from the patient after a thorough discussion of the procedural risks, benefits and alternatives. All questions were addressed. A timeout was performed prior to the initiation of the procedure. Patient was placed supine on the CT scanner. Images through the lower abdomen and pelvis were performed. Air-fluid collection in the lower abdomen/pelvic region was identified and targeted for drainage. The anterior abdomen was prepped with chlorhexidine and sterile field was created. Skin and soft tissues were anesthetized using 1% lidocaine. A small incision was made. Using CT guidance, a 18 gauge trocar needle was directed into the air-fluid collection. Gray purulent fluid was coming out of the needle. Super stiff Amplatz wire was advanced into the collection and follow up CT images were obtained. The drain was dilated to accommodate a 12 [place] multipurpose drain. 12 French  drain was placed. 40 mL gray colored purulent fluid and gas was removed from the collection. Follow up CT images were obtained. Catheter was sutured to skin and attached to a suction bulb. FINDINGS: Air-fluid collection just cephalad to the urinary bladder. Drain was successfully placed within the collection and 40 mL of purulent fluid was removed. The abscess was decompressed at the end of the procedure. IMPRESSION: CT-guided placement of a drainage catheter within the lower abdomen/pelvic abscess. Electronically Signed   By: Markus Daft M.D.   On: 04/14/2020 11:48    ASSESSMENT / PLAN:  Sepsis in the setting of Acute Sigmoid Diverticulitis w/ Diverticular Abscess -Monitor fever curve -Trend WBC's & Procalcitonin -Follow cultures as above -Continue Ceftriaxone & Flagyl -Trend lactic acid  -General Surgery following, appreciate input, will follow recommendations -IR placed CT guided Percutaneous drain to abscess 04/14/20 -Pain control -Prn Antiemetics -Follow abdominal exam ~ If pt were to develop s/sx of peritonitis, worsening sepsis, lactic acidosis, would likely need surgical intervention -Pt's lactic acidosis continues to worsen (lactic 6.9) despite no hypotension at the moment.  Discussed with Dr. Celine Ahr of General Surgery who recommends repeat Abdominal CT w/ contrast.  Will place on Bicarb gtt per Protocol for renal protection.    Acute on Chronic Hypoxic Respiratory Failure secondary to Acute COPD Exacerbation Hx: COPD, Asthma -Supplemental O2 as needed to maintain O2 sats 88 to 92% -Pt is refusing BiPAP; pt made herself DNI -Follow intermittent CXR & ABG as needed -Scheduled Bronchodilators -IV & Nebulized Steroids   AKI -Monitor I&O's / urinary output -Follow BMP -Ensure adequate renal perfusion -Avoid nephrotoxic agents as able -Replace electrolytes as indicated -IV Fluids -IV Bicarb gtt per protocol for renal protection due to CT Abdomen/Pelvis with constrast   HIV on  Antiretroviral Therapy -Continue Valtrex -Consider ID consult           Pt's prognosis is guarded, she is at high risk for cardiac arrest and death    BEST PRACTICES DISPOSITION: ICU GOALS OF CARE: DNI VTE PROPHYLAXIS: SQ Lovenox CONSULTS: Hospitalist (primary service), General Surgery UPDATES: Updated pt  at bedside 04/14/20  Darel Hong, AGACNP-BC [facility] Pager: [phone]  04/14/2020, 7:34 PM

## 2020-04-14 NOTE — Progress Notes (Signed)
PATIENT REFUSED BIPAP IN ER SHE HAS WOB AND SOB USING ACCESSORY MUSCLES   I HAVE EPXLAINED TO HER THE IMPORTANCE OF BIPAP AND THE NEXT STEP WOULD BE PLACED IN A COMA AND PROVIDE MECHANICAL VENTILATION  PATIENT IS ALERT AND AWAKE, FOLLOWS COMMANDS SHE DOES NOT WANT TO BE PLACED ON VENTILATOR, BUT SHE IS OK WITH CHEST COMPRESSIONS.  I WILL PLACE LIMITED CODE ORDERS.

## 2020-04-14 NOTE — Sedation Documentation (Signed)
Report called to [facility], RN in ER. Pt. tx back to ER #[id number] now via stretcher , on 4L/[place]. Stable for tx. Back to ER. Pt. Tolerated procedure well.

## 2020-04-14 NOTE — ED Notes (Signed)
Admitting MD Manuella Ghazi made aware that pt is now hypotensive and diaphoretic. Pt continues with profuse wheezing at this time, denies worsening of breathing from baseline.

## 2020-04-14 NOTE — H&P (Addendum)
[place]   PATIENT NAME: Melanie Bowen    MR#:  [id number]  DATE OF BIRTH:  [birth year]  DATE OF ADMISSION:  04/14/2020  PRIMARY CARE PHYSICIAN: Alene Mires Elyse Jarvis, MD   REQUESTING/REFERRING PHYSICIAN: Blake Divine, MD  CHIEF COMPLAINT:   Chief Complaint  Patient presents with  . Abdominal Pain    HISTORY OF PRESENT ILLNESS:  Melanie Bowen  is a 59 y.o. female with a known history of COPD/asthma, hypertension, diverticulosis, HIV, GERD and depression, presented to the emergency room with acute onset of worsening left lower quadrant abdominal pain with associated nausea without vomiting as well as constipation.  The patient started having pain Friday 9 days ago when she was seen in the ER and given p.o. Augmentin which helped improve her symptoms some until her pain got worse since yesterday.  No dysuria, oliguria or hematuria or flank pain.  No cough or wheezing or dyspnea.  No chest pain or palpitations.  No melena or bright red bleeding per rectum.  No other bleeding diathesis.  Upon presentation to the emergency room, blood pressure was 151/98 with otherwise normal vital signs.  Labs revealed albumin of 2.8 with troponin of 6.4 and otherwise unremarkable CMP.  Urinalysis was unremarkable.  Abdominal and pelvic CT scan revealed the following: 1. Findings consistent with acute sigmoid colon diverticulitis. 7.5 x 4.4 cm gas and fluid collection within the anterior pelvis suspicious for contained perforation/abscess, this appears to communicate with the thickened inflamed sigmoid colon. 2. Small amount of upper abdominal and pelvic ascites. 3. Thickened appearance of pelvic small bowel loops, likely reactive  The patient was given 1 L bolus of IV lactated Ringer, 1 p.o. Norco, half milligram of IV Dilaudid, 4 mg of IV morphine sulfate and 4 mg of IV Zofran as well as 3.375 g of IV Zosyn.  Dr. Christian Mate was consulted about the patient and he will notify IR in a.m.  She  will be admitted to a medical bed for further evaluation and management. PAST MEDICAL HISTORY:   Past Medical History:  Diagnosis Date  . Acute GI hemorrhage 12/2015   required transfusion  . Allergic rhinitis   . Arthritis    DDD  . Asthma   . Blepharitis   . Bronchitis   . Cigarette smoker   . Claustrophobia   . COPD (chronic obstructive pulmonary disease) ([place])   . Cough   . DDD (degenerative disc disease), lumbar 04/10/2014  . Depression   . Diverticulosis   . Ectopic pregnancy   . Eye irritation   . Genital herpes   . GERD (gastroesophageal reflux disease)   . Headache   . HIV (human immunodeficiency virus infection) ([place])   . Hypertension   . Paresthesia of hand, bilateral   . Shortness of breath dyspnea    at times due to asthma    PAST SURGICAL HISTORY:   Past Surgical History:  Procedure Laterality Date  . BACK SURGERY  12/2014   lumbar lam. Dr. Deri Fuelling  . BACK SURGERY  12/25/2012   Removal lumbosubarachnoid shunt system, L5-S1 TF ESI, Dr. Virl Axe Minchew  . back surgery may 2016     lumbar   . BUNIONECTOMY Bilateral    feet  . COLONOSCOPY  12/06/2014   rescheduled from 11/01/2014 due to poor prep  . DIAGNOSTIC LAPAROSCOPY    . DISTAL FEMUR OSTEOTOMY Right    Dr. Earnestine Leys  . ESOPHAGOGASTRODUODENOSCOPY Left 01/11/2016   Procedure: ESOPHAGOGASTRODUODENOSCOPY (EGD);  Surgeon: Hulen Luster, MD;  Location: [facility] ENDOSCOPY;  Service: Endoscopy;  Laterality: Left;  . Finger fracture Right    5th finger  . FRACTURE SURGERY Right 01/25/2014   closed reduction & percutaneous pinning of 5th digit  . HAND SURGERY Bilateral    carpal tunnel  . JOINT REPLACEMENT Bilateral    knees  . KNEE SURGERY Bilateral 2003,2007   total Joint  . LAPAROSCOPY FOR ECTOPIC PREGNANCY    . PERIPHERAL VASCULAR CATHETERIZATION N/A 01/12/2016   Procedure: Visceral Angiography;  Surgeon: Algernon Huxley, MD;  Location: [place] CV LAB;  Service: Cardiovascular;  Laterality:  N/A;  . PERIPHERAL VASCULAR CATHETERIZATION N/A 01/12/2016   Procedure: Visceral Artery Intervention;  Surgeon: Algernon Huxley, MD;  Location: [place] [facility] LAB;  Service: Cardiovascular;  Laterality: N/A;  . REVISION TOTAL KNEE ARTHROPLASTY Right 12/31/2002   Dr. Marry Guan  . TUBAL LIGATION    . VIDEO BRONCHOSCOPY N/A 06/30/2015   Procedure: VIDEO BRONCHOSCOPY WITHOUT FLUORO;  Surgeon: Vilinda Boehringer, MD;  Location: [facility] ORS;  Service: Cardiopulmonary;  Laterality: N/A;    SOCIAL HISTORY:   Social History   Tobacco Use  . Smoking status: Current Some Day Smoker    Packs/day: 0.25    Years: 38.00    Pack years: 9.50    Types: Cigarettes  . Smokeless tobacco: Never Used  . Tobacco comment: 1 cig daily  Substance Use Topics  . Alcohol use: Yes    Alcohol/week: 5.0 standard drinks    Types: 5 Glasses of wine per week    Comment: occ    FAMILY HISTORY:   Family History  Problem Relation Age of Onset  . Breast cancer Sister     DRUG ALLERGIES:   Allergies  Allergen Reactions  . Acetaminophen Nausea And Vomiting  . Ibuprofen Other (See Comments)    Reports causes bleeding  . Gabapentin Rash  . Morphine Itching and Rash  . Morphine And Related Itching  . Zanaflex  [Tizanidine] Rash    REVIEW OF SYSTEMS:   ROS As per history of present illness. All pertinent systems were reviewed above. Constitutional, HEENT, cardiovascular, respiratory, GI, GU, musculoskeletal, neuro, psychiatric, endocrine, integumentary and hematologic systems were reviewed and are otherwise negative/unremarkable except for positive findings mentioned above in the HPI.   MEDICATIONS AT HOME:   Prior to Admission medications   Medication Sig Start Date End Date Taking? Authorizing Provider  albuterol (VENTOLIN HFA) 108 (90 Base) MCG/ACT inhaler Inhale 2 puffs into the lungs every 6 (six) hours as needed for wheezing or shortness of breath. 05/21/19   Laban Emperor, PA-C  amLODipine (NORVASC) 10 MG  tablet Take 10 mg by mouth daily.     [provider]  DEXILANT 60 MG capsule Take 60 mg by mouth daily. 04/25/19   [provider]  elvitegravir-cobicistat-emtricitabine-tenofovir (GENVOYA) 150-150-200-10 MG TABS tablet Take 1 tablet by mouth at bedtime.    [provider]  famotidine (PEPCID) 20 MG tablet Take 20 mg by mouth daily.  05/08/19   [provider]  gabapentin (NEURONTIN) 600 MG tablet Take 600 mg by mouth 3 (three) times daily. 04/02/20   [provider]  hydrochlorothiazide (HYDRODIURIL) 25 MG tablet Take 25 mg by mouth daily.    [provider]  HYDROcodone-acetaminophen (NORCO/VICODIN) 5-325 MG tablet Take 1 tablet by mouth every 6 (six) hours as needed for severe pain. 04/04/20   Lavonia Drafts, MD  losartan (COZAAR) 50 MG tablet Take 50 mg by  mouth daily.    [provider]  methocarbamol (ROBAXIN) 500 MG tablet Take 500 mg by mouth in the morning, at noon, in the evening, and at bedtime. 03/31/20   [provider]  traMADol (ULTRAM) 50 MG tablet Take 50 mg by mouth every 6 (six) hours as needed. 04/03/20   [provider]  valACYclovir (VALTREX) 1000 MG tablet Take 1,000 mg by mouth at bedtime.  04/25/19   [provider]  Grant Ruts INHUB 250-50 MCG/DOSE AEPB Inhale 1 puff into the lungs 2 (two) times daily. 11/05/19   [provider]      VITAL SIGNS:  Blood pressure (!) 141/74, pulse 78, temperature 97.9 F (36.6 C), temperature source Oral, resp. rate 20, height 5\' 9"  (1.753 m), weight 117.9 kg, SpO2 96 %.  PHYSICAL EXAMINATION:  Physical Exam  GENERAL:  59 y.o.-year-old patient lying in the bed with no acute distress.  EYES: Pupils equal, round, reactive to light and accommodation. No scleral icterus. Extraocular muscles intact.  HEENT: Head atraumatic, normocephalic. Oropharynx and nasopharynx clear.  NECK:  Supple, no jugular venous distention. No thyroid enlargement, no tenderness.   LUNGS: Normal breath sounds bilaterally, no wheezing, rales,rhonchi or crepitation. No use of accessory muscles of respiration.  CARDIOVASCULAR: Regular rate and rhythm, S1, S2 normal. No murmurs, rubs, or gallops.  ABDOMEN: Soft, nondistended with exquisite left periumbilical and lower quadrant abdominal tenderness with mild guarding.  Bowel sounds present. No organomegaly or mass.  EXTREMITIES: No pedal edema, cyanosis, or clubbing.  NEUROLOGIC: Cranial nerves II through XII are intact. Muscle strength 5/5 in all extremities. Sensation intact. Gait not checked.  PSYCHIATRIC: The patient is alert and oriented x 3.  Normal affect and good eye contact. SKIN: No obvious rash, lesion, or ulcer.   LABORATORY PANEL:   CBC Recent Labs  Lab 04/13/20 2140  WBC 7.8  HGB 13.4  HCT 40.4  PLT 354   ------------------------------------------------------------------------------------------------------------------  Chemistries  Recent Labs  Lab 04/13/20 2140  NA 135  K 3.8  CL 101  CO2 23  GLUCOSE 125*  BUN 25*  CREATININE 0.80  CALCIUM 8.6*  AST 25  ALT 31  ALKPHOS 89  BILITOT 0.6   ------------------------------------------------------------------------------------------------------------------  Cardiac Enzymes No results for input(s): TROPONINI in the last 168 hours. ------------------------------------------------------------------------------------------------------------------  RADIOLOGY:  CT Abdomen Pelvis W Contrast  Result Date: 04/14/2020 CLINICAL DATA:  Lower abdominal pain EXAM: CT ABDOMEN AND PELVIS WITH CONTRAST TECHNIQUE: Multidetector CT imaging of the abdomen and pelvis was performed using the standard protocol following bolus administration of intravenous contrast. CONTRAST:  166mL OMNIPAQUE IOHEXOL 300 MG/ML  SOLN COMPARISON:  01/31/2019 FINDINGS: Lower chest: Lung bases demonstrate no acute consolidation or pleural effusion. Normal cardiac size. Hepatobiliary: No  focal liver abnormality is seen. No gallstones, gallbladder wall thickening, or biliary dilatation. Pancreas: Unremarkable. No pancreatic ductal dilatation or surrounding inflammatory changes. Spleen: Normal in size without focal abnormality. Adrenals/Urinary Tract: Adrenal glands are unremarkable. Kidneys are normal, without renal calculi, focal lesion, or hydronephrosis. Bladder is unremarkable. Stomach/Bowel: The stomach is nonenlarged. No dilated small bowel. Mildly thickened loops of small bowel within the pelvis, likely reactive. Negative appendix. Sigmoid colon diverticular disease. Wall thickening and mild inflammatory change at the distal sigmoid colon consistent with diverticulitis. 7.5 x 4.4 cm gas and fluid collection within the anterior pelvis suspicious for contained perforation/abscess. Soft tissue inflammatory density extends from inflamed sigmoid colon to the gas and fluid collection, sagittal series 6, image number 96. Vascular/Lymphatic: Moderate aortic atherosclerosis.  No aneurysm. No suspicious nodes. Reproductive: Uterus and bilateral adnexa are unremarkable. Other: No free air. Small free fluid in the pelvis. Small amount of upper abdominal ascites. Generalized soft tissue stranding within the pelvis consistent with inflammatory process. Musculoskeletal: Postsurgical changes of the lumbar spine with surgical rod and fixating screws L3 through L5. No acute osseous abnormality IMPRESSION: 1. Findings consistent with acute sigmoid colon diverticulitis. 7.5 x 4.4 cm gas and fluid collection within the anterior pelvis suspicious for contained perforation/abscess, this appears to communicate with the thickened inflamed sigmoid colon. 2. Small amount of upper abdominal and pelvic ascites. 3. Thickened appearance of pelvic small bowel loops, likely reactive Aortic Atherosclerosis (ICD10-I70.0). Electronically Signed   By: Donavan Foil M.D.   On: 04/14/2020 01:27      IMPRESSION AND PLAN:  1.   Acute sigmoid diverticulitis with diverticular abscess. -The patient will be admitted to a med-surg bed. -We will continue her on IV Zosyn. -Pain management will be provided. -The patient will be kept n.p.o. -General surgery consultation will be obtained. -Dr. Christian Mate was notified about patient. -Consult for interventional radiology for guided percutaneous drainage was placed and Dr. Christian Mate will discuss with IR in a.m.  2.  Hypertension. -The patient will be placed on as needed IV labetalol.  3.  HIV disease on antiretroviral therapy. Jorje Guild will be continued. -CD4 count and viral load will be obtained. -We will continue her Valtrex.  4.  COPD and asthma without exacerbation. -We will continue her as needed albuterol and Wixela/alternative formulary.  5.  DVT prophylaxis. -Subcutaneous Lovenox.  All the records are reviewed and case discussed with ED provider. The plan of care was discussed in details with the patient (and family). I answered all questions. The patient agreed to proceed with the above mentioned plan. Further management will depend upon hospital course.   CODE STATUS: Full code  Status is: Inpatient  Remains inpatient appropriate because:Ongoing active pain requiring inpatient pain management, Ongoing diagnostic testing needed not appropriate for outpatient work up, Unsafe d/c plan, IV treatments appropriate due to intensity of illness or inability to take PO and Inpatient level of care appropriate due to severity of illness   Dispo: The patient is from: Home              Anticipated d/c is to: Home              Anticipated d/c date is: 3 days              Patient currently is not medically stable to d/c.   TOTAL TIME TAKING CARE OF THIS PATIENT: 55 minutes.    Christel Mormon M.D on 04/14/2020 at 2:40 AM  [facility]   From 7 PM-7 AM, contact night-coverage [email or web address]  CC: Primary care physician; Revelo, Elyse Jarvis, MD   Note:  This dictation was prepared with Dragon dictation along with smaller phrase technology. Any transcriptional typo errors that result from this process are unintentional.

## 2020-04-14 NOTE — Progress Notes (Signed)
Same day rounding progress note  Patient seen and examined while in the ED, waiting for the ICU/stepdown bed  Sepsis present on admission Hypotension, tachycardia, acute kidney injury with source of infection - diverticular abscess Sepsis protocol initiated, will admit to stepdown, may need pressors Intensivist consult, Dr. Mortimer Fries aware There are no beds in stepdown, patient may need close monitoring in the ED.  She is at high risk for clinical decompensation Given IV Zosyn while in the ED.  Will start IV Rocephin and Flagyl Lactic acidosis improving with treatment.  Colonic diverticular abscess S/p CT-guided drainage by IR on 8/16 Surgery following  Time spent: 35 minutes

## 2020-04-14 NOTE — Progress Notes (Signed)
Reached out to [employer] concerning more access for the patient. She has had 3 PIV's in 12 hours and an IV team consult was placed for more access. I spoke with the nurse via secure chat, asking if maybe a different type of access would be better. The RN asked about a midline and education was given that the midline would be a single lumen access as well so incompatible drugs would still need separate access. After the education was given I questioned if they still wanted the midline and did not receive a response. However another PIV was placed by ED RN. IV team not needed at this time.

## 2020-04-14 NOTE — Sedation Documentation (Signed)
40 ml yellowish, thin fluid extracted from lower abdominal abcess; "foul" smelling per MD. Fluid sent for culture now by Freida Busman, RT. Pt. Tolerated procedure well.

## 2020-04-14 NOTE — Consult Note (Signed)
Chief Complaint: Patient was seen in consultation today for diverticular abscess.   Referring Physician(s): Eugenie Norrie MD  Patient Status: [facility] - In-pt  History of Present Illness: Melanie Bowen is a 59 y.o. female with worsening lower abdominal pain with nausea.  Patient was seen in the emergency department 10 days ago and was given Augmentin which helped improve her symptoms until her pain recently got worse.  Patient had a CT of the abdomen pelvis that demonstrated a pelvic abscess and findings are most compatible with a diverticular abscess.  Past medical history is significant for COPD/asthma and HIV.  Patient's main complaint is abdominal pain.  Patient is also having breathing difficulties but does not want to sit up due to the abdominal pain.  Past Medical History:  Diagnosis Date  . Acute GI hemorrhage 12/2015   required transfusion  . Allergic rhinitis   . Arthritis    DDD  . Asthma   . Blepharitis   . Bronchitis   . Cigarette smoker   . Claustrophobia   . COPD (chronic obstructive pulmonary disease) ([place])   . Cough   . DDD (degenerative disc disease), lumbar 04/10/2014  . Depression   . Diverticulosis   . Ectopic pregnancy   . Eye irritation   . Genital herpes   . GERD (gastroesophageal reflux disease)   . Headache   . HIV (human immunodeficiency virus infection) ([place])   . Hypertension   . Paresthesia of hand, bilateral   . Shortness of breath dyspnea    at times due to asthma    Past Surgical History:  Procedure Laterality Date  . BACK SURGERY  12/2014   lumbar lam. Dr. Deri Fuelling  . BACK SURGERY  12/25/2012   Removal lumbosubarachnoid shunt system, L5-S1 TF ESI, Dr. Virl Axe Minchew  . back surgery may 2016     lumbar   . BUNIONECTOMY Bilateral    feet  . COLONOSCOPY  12/06/2014   rescheduled from 11/01/2014 due to poor prep  . DIAGNOSTIC LAPAROSCOPY    . DISTAL FEMUR OSTEOTOMY Right    Dr. Earnestine Leys  . ESOPHAGOGASTRODUODENOSCOPY Left  01/11/2016   Procedure: ESOPHAGOGASTRODUODENOSCOPY (EGD);  Surgeon: Hulen Luster, MD;  Location: [facility] ENDOSCOPY;  Service: Endoscopy;  Laterality: Left;  . Finger fracture Right    5th finger  . FRACTURE SURGERY Right 01/25/2014   closed reduction & percutaneous pinning of 5th digit  . HAND SURGERY Bilateral    carpal tunnel  . JOINT REPLACEMENT Bilateral    knees  . KNEE SURGERY Bilateral 2003,2007   total Joint  . LAPAROSCOPY FOR ECTOPIC PREGNANCY    . PERIPHERAL VASCULAR CATHETERIZATION N/A 01/12/2016   Procedure: Visceral Angiography;  Surgeon: Algernon Huxley, MD;  Location: [place] CV LAB;  Service: Cardiovascular;  Laterality: N/A;  . PERIPHERAL VASCULAR CATHETERIZATION N/A 01/12/2016   Procedure: Visceral Artery Intervention;  Surgeon: Algernon Huxley, MD;  Location: [place] CV LAB;  Service: Cardiovascular;  Laterality: N/A;  . REVISION TOTAL KNEE ARTHROPLASTY Right 12/31/2002   Dr. Marry Guan  . TUBAL LIGATION    . VIDEO BRONCHOSCOPY N/A 06/30/2015   Procedure: VIDEO BRONCHOSCOPY WITHOUT FLUORO;  Surgeon: Vilinda Boehringer, MD;  Location: [facility] ORS;  Service: Cardiopulmonary;  Laterality: N/A;    Allergies: Acetaminophen, Ibuprofen, Gabapentin, Morphine, Morphine and related, and Zanaflex  [tizanidine]  Medications: Prior to Admission medications   Medication Sig Start Date End Date Taking? Authorizing Provider  amLODipine (NORVASC) 10 MG tablet Take 10  mg by mouth daily.    Yes [provider]  DEXILANT 60 MG capsule Take 60 mg by mouth daily. 04/25/19  Yes [provider]  gabapentin (NEURONTIN) 600 MG tablet Take 600 mg by mouth 3 (three) times daily. 04/02/20  Yes [provider]  hydrochlorothiazide (HYDRODIURIL) 25 MG tablet Take 25 mg by mouth daily.   Yes [provider]  HYDROcodone-acetaminophen (NORCO/VICODIN) 5-325 MG tablet Take 1 tablet by mouth every 6 (six) hours as needed for severe pain. 04/04/20  Yes Lavonia Drafts, MD  losartan  (COZAAR) 50 MG tablet Take 50 mg by mouth daily.   Yes [provider]  methocarbamol (ROBAXIN) 500 MG tablet Take 500 mg by mouth in the morning, at noon, in the evening, and at bedtime. 03/31/20  Yes [provider]  Grant Ruts INHUB 250-50 MCG/DOSE AEPB Inhale 1 puff into the lungs 2 (two) times daily. 11/05/19  Yes [provider]  albuterol (VENTOLIN HFA) 108 (90 Base) MCG/ACT inhaler Inhale 2 puffs into the lungs every 6 (six) hours as needed for wheezing or shortness of breath. 05/21/19   Laban Emperor, PA-C  elvitegravir-cobicistat-emtricitabine-tenofovir (GENVOYA) 150-150-200-10 MG TABS tablet Take 1 tablet by mouth at bedtime.    [provider]  famotidine (PEPCID) 20 MG tablet Take 20 mg by mouth daily.  05/08/19   [provider]  traMADol (ULTRAM) 50 MG tablet Take 50 mg by mouth every 6 (six) hours as needed. 04/03/20   [provider]  valACYclovir (VALTREX) 1000 MG tablet Take 1,000 mg by mouth at bedtime.  04/25/19   [provider]     Family History  Problem Relation Age of Onset  . Breast cancer Sister     Social History   Socioeconomic History  . Marital status: Single    Spouse name: Not on file  . Number of children: Not on file  . Years of education: Not on file  . Highest education level: Not on file  Occupational History  . Not on file  Tobacco Use  . Smoking status: Current Some Day Smoker    Packs/day: 0.25    Years: 38.00    Pack years: 9.50    Types: Cigarettes  . Smokeless tobacco: Never Used  . Tobacco comment: 1 cig daily  Vaping Use  . Vaping Use: Never used  Substance and Sexual Activity  . Alcohol use: Yes    Alcohol/week: 5.0 standard drinks    Types: 5 Glasses of wine per week    Comment: occ  . Drug use: Not Currently  . Sexual activity: Not on file  Other Topics Concern  . Not on file  Social History Narrative  . Not on file   Social Determinants of Health   Financial Resource  Strain:   . Difficulty of Paying Living Expenses:   Food Insecurity:   . Worried About [name] fundraiser in the Last Year:   . Arboriculturist in the Last Year:   Transportation Needs:   . Film/video editor (Medical):   Marland Kitchen Lack of Transportation (Non-Medical):   Physical Activity:   . Days of Exercise per Week:   . Minutes of Exercise per Session:   Stress:   . Feeling of Stress :   Social Connections:   . Frequency of Communication with Friends and Family:   . Frequency of Social Gatherings with Friends and Family:   . Attends Religious Services:   . Active Member of Clubs  or Organizations:   . Attends Archivist Meetings:   Marland Kitchen Marital Status:     Review of Systems  Respiratory: Positive for shortness of breath.   Gastrointestinal: Positive for abdominal pain.    Vital Signs: BP 96/70   Pulse (!) 127   Temp 97.9 F (36.6 C) (Oral)   Resp (!) 27   Ht 5\' 9"  (1.753 m)   Wt 117.9 kg   SpO2 96%   BMI 38.40 kg/m   Physical Exam Constitutional:      General: She is in acute distress.  Cardiovascular:     Rate and Rhythm: Tachycardia present.  Pulmonary:     Breath sounds: Wheezing present.     Comments: Severe bilateral wheezes. Abdominal:     Palpations: Abdomen is soft.     Tenderness: There is abdominal tenderness. There is guarding.     Comments: Pain with light palpation.  Neurological:     Mental Status: She is alert.     Imaging: CT ABDOMEN PELVIS WO CONTRAST  Result Date: 04/14/2020 CLINICAL DATA:  Abdominal pain. Contained perforated acute diverticulitis. EXAM: CT ABDOMEN AND PELVIS WITHOUT CONTRAST TECHNIQUE: Multidetector CT imaging of the abdomen and pelvis was performed following the standard protocol without IV contrast. COMPARISON:  CT 04/14/2020, 01/31/2019 FINDINGS: Lower chest: Lung bases are clear. Hepatobiliary: No focal hepatic lesion. No biliary duct dilatation. Common bile duct is normal. Pancreas: Pancreas is normal. No  ductal dilatation. No pancreatic inflammation. Spleen: Normal spleen Adrenals/urinary tract: Adrenal glands and kidneys are normal. The ureters and bladder normal. Stomach/Bowel: Stomach duodenum normal. Mildly thickened loops small bowel in the pelvis is favored secondary inflammation related to pelvic abscess. Appendix is partially imaged (image 61/2) appears normal. The ascending and transverse colon normal. There are diverticula descending colon and sigmoid colon. Diverticular disease is heavy in the proximal sigmoid colon. There is a thick-walled fluid collection with the an air-fluid level measuring 8.1 x 4.7 cm in the central anterior upper pelvis. This is adjacent to the sigmoid colon. There is a thin tract between the colon in the fluid collection on image 71/2. Similar pericolonic abscess seen on CT 01/17/2019. Findings most consistent with contained sigmoid perforation with abscess formation. No intraperitoneal free.  Minimal fluid in the pelvis. Vascular/Lymphatic: Abdominal aorta is normal caliber with atherosclerotic calcification. There is no retroperitoneal or periportal lymphadenopathy. No pelvic lymphadenopathy. Reproductive: Post hysterectomy.  Adnexa unremarkable Other: Small volume free fluid the pelvis Musculoskeletal: Posterior lumbar fusion IMPRESSION: 1. No significant interval change in short interval follow-up. 2. Contained sigmoid colon perforation with abscess formation. Perforation presumed related to diverticulitis. Findings similar to May 2020 with increased volume of contained perforation. 3. No intraperitoneal free air. 4. Secondary inflammation of the small bowel. Electronically Signed   By: Suzy Bouchard M.D.   On: 04/14/2020 08:08   DG Chest 1 View  Result Date: 04/14/2020 CLINICAL DATA:  Dyspnea EXAM: CHEST  1 VIEW COMPARISON:  11/06/2019 FINDINGS: Mildly low lung volumes. There is no edema, consolidation, effusion, or pneumothorax. Normal heart size and mediastinal  contours. Artifact from EKG leads IMPRESSION: No evidence of acute disease. Electronically Signed   By: Monte Fantasia M.D.   On: 04/14/2020 07:17   CT Abdomen Pelvis W Contrast  Result Date: 04/14/2020 CLINICAL DATA:  Lower abdominal pain EXAM: CT ABDOMEN AND PELVIS WITH CONTRAST TECHNIQUE: Multidetector CT imaging of the abdomen and pelvis was performed using the standard protocol following bolus administration of intravenous contrast. CONTRAST:  152mL  OMNIPAQUE IOHEXOL 300 MG/ML  SOLN COMPARISON:  01/31/2019 FINDINGS: Lower chest: Lung bases demonstrate no acute consolidation or pleural effusion. Normal cardiac size. Hepatobiliary: No focal liver abnormality is seen. No gallstones, gallbladder wall thickening, or biliary dilatation. Pancreas: Unremarkable. No pancreatic ductal dilatation or surrounding inflammatory changes. Spleen: Normal in size without focal abnormality. Adrenals/Urinary Tract: Adrenal glands are unremarkable. Kidneys are normal, without renal calculi, focal lesion, or hydronephrosis. Bladder is unremarkable. Stomach/Bowel: The stomach is nonenlarged. No dilated small bowel. Mildly thickened loops of small bowel within the pelvis, likely reactive. Negative appendix. Sigmoid colon diverticular disease. Wall thickening and mild inflammatory change at the distal sigmoid colon consistent with diverticulitis. 7.5 x 4.4 cm gas and fluid collection within the anterior pelvis suspicious for contained perforation/abscess. Soft tissue inflammatory density extends from inflamed sigmoid colon to the gas and fluid collection, sagittal series 6, image number 96. Vascular/Lymphatic: Moderate aortic atherosclerosis. No aneurysm. No suspicious nodes. Reproductive: Uterus and bilateral adnexa are unremarkable. Other: No free air. Small free fluid in the pelvis. Small amount of upper abdominal ascites. Generalized soft tissue stranding within the pelvis consistent with inflammatory process. Musculoskeletal:  Postsurgical changes of the lumbar spine with surgical rod and fixating screws L3 through L5. No acute osseous abnormality IMPRESSION: 1. Findings consistent with acute sigmoid colon diverticulitis. 7.5 x 4.4 cm gas and fluid collection within the anterior pelvis suspicious for contained perforation/abscess, this appears to communicate with the thickened inflamed sigmoid colon. 2. Small amount of upper abdominal and pelvic ascites. 3. Thickened appearance of pelvic small bowel loops, likely reactive Aortic Atherosclerosis (ICD10-I70.0). Electronically Signed   By: Donavan Foil M.D.   On: 04/14/2020 01:27   MR SHOULDER RIGHT WO CONTRAST  Result Date: 04/01/2020 CLINICAL DATA:  Right shoulder pain and limited range of motion. No specific injury. EXAM: MRI OF THE RIGHT SHOULDER WITHOUT CONTRAST TECHNIQUE: Multiplanar, multisequence MR imaging of the shoulder was performed. No intravenous contrast was administered. COMPARISON:  None. FINDINGS: Examination limited by patient motion. Rotator cuff: Full-thickness retracted tear of the supraspinatus tendon is noted. This involves the mid and posterior fibers. The tear is in the critical zone region. Maximum retraction is estimated at 22 mm and the tear is 15.5 mm wide. The anterior fibers are still intact and the subscapularis tendon is intact. The infraspinatus tendon is intact. Tendinopathy noted with interstitial tears. Muscles:  No significant findings. Biceps long head:  Intact. Acromioclavicular Joint: Moderate degenerative changes. Type 2 acromion. No significant lateral downsloping or undersurface spurring. Glenohumeral Joint: Moderate to advanced degenerative changes with areas of full or near full-thickness cartilage loss, joint space narrowing, spurring and subchondral cystic change. Labrum:  Labral degenerative changes without obvious tear. Bones: No acute bony findings. There appears to be a small defect in the glenoid with fluid tracking back into the  subcoracoid space. Possible old fracture. Other: Expected fluid in the subacromial/subdeltoid bursa. IMPRESSION: 1. Full-thickness retracted tear of the supraspinatus tendon as described above. 2. Intact long head biceps tendon and grossly intact glenoid labrum. 3. Advanced glenohumeral joint degenerative changes. 4. Moderate AC joint degenerative changes but no other significant findings for bony impingement. Electronically Signed   By: Marijo Sanes M.D.   On: 04/01/2020 20:48    Labs:  CBC: Recent Labs    11/08/19 0323 04/04/20 1035 04/13/20 2140 04/14/20 0648  WBC 17.4* 17.2* 7.8 4.7  HGB 10.9* 13.0 13.4 14.7  HCT 33.6* 37.3 40.4 42.7  PLT 495* 269 354 296  COAGS: Recent Labs    11/06/19 0432  INR 1.1  APTT 26    BMP: Recent Labs    11/08/19 0323 04/04/20 1035 04/13/20 2140 04/14/20 0648  NA 139 138 135 133*  K 4.1 4.1 3.8 4.5  CL 106 105 101 100  CO2 25 25 23  18*  GLUCOSE 111* 100* 125* 153*  BUN 24* 26* 25* 30*  CALCIUM 8.3* 8.6* 8.6* 8.4*  CREATININE 1.00 1.03* 0.80 1.28*  GFRNONAA >60 60* >60 46*  GFRAA >60 >60 >60 53*    LIVER FUNCTION TESTS: Recent Labs    11/04/19 0821 11/08/19 0323 04/04/20 1035 04/13/20 2140  BILITOT 0.4 0.6 0.9 0.6  AST 19 14* 19 25  ALT 13 14 27 31   ALKPHOS 99 65 75 89  PROT 7.4 6.1* 6.4* 6.4*  ALBUMIN 3.5 2.7* 3.2* 2.8*    TUMOR MARKERS: No results for input(s): AFPTM, CEA, CA199, CHROMGRNA in the last 8760 hours.  Assessment and Plan:  59 year old with a pelvic fluid collection that likely represents a diverticular abscess.  Recent CT demonstrates inflammatory changes throughout the abdomen and suspect secondary inflammatory changes in small bowel.  Patient has extreme tenderness in the abdomen.  In addition, the patient has shortness of breath with significant wheezing in both lungs.  Patient needs CT-guided percutaneous drainage of the diverticular abscess.  Based on the patient's respiratory issues, we may not be  able to give the patient moderate sedation.  We will try to perform the procedure with pain meds only.  Risks and benefits discussed with the patient including bleeding, infection, damage to adjacent structures, bowel perforation/fistula connection, and sepsis.  All of the patient's questions were answered, patient is agreeable to proceed. Consent signed and in chart.   Thank you for this interesting consult.  I greatly enjoyed meeting Melanie Bowen and look forward to participating in their care.  A copy of this report was sent to the requesting provider on this date.  Electronically Signed: Burman Riis, MD 04/14/2020, 10:28 AM   I spent a total of 15 minutes  in face to face in clinical consultation, greater than 50% of which was counseling/coordinating care for CT-guided drain placement.

## 2020-04-14 NOTE — ED Notes (Signed)
Admitting Provider at bedside. 

## 2020-04-14 NOTE — ED Notes (Addendum)
Surgical PA students at bedside at this time.

## 2020-04-14 NOTE — Progress Notes (Signed)
CODE SEPSIS - PHARMACY COMMUNICATION  **Broad Spectrum Antibiotics should be administered within 1 hour of Sepsis diagnosis**  Time Code Sepsis Called/Page Received: 1227  Antibiotics Ordered: CTX, metronidazole,zosyn  Time of 1st antibiotic administration: 1207 (pre-page)      Rowland Lathe ,PharmD Clinical Pharmacist  04/14/2020  1:33 PM

## 2020-04-14 NOTE — Sedation Documentation (Signed)
Dr. Anselm Pancoast at bedside in CT explaining to pt. Need for abcess drain. Pt. Has wheezing throughout ant. And post. Had a breathing treatment approx. 7:11 AM. Placed on a NRB for procedure. Pt. Wishes to proceed with drain. Pt. Extremely tender to touch to LLQ abdomen.

## 2020-04-14 NOTE — ED Notes (Signed)
Pt taken to CT at this time.

## 2020-04-14 NOTE — ED Notes (Signed)
This RN and Bubba Hales, RN to bedside, introduced selves to patient. Blood work obtained at this time, 2nd IV initiated, medication administered per order. Pt with noted wheezes, and appeared labored breathing, states is baseline for patient, pt remains on 2L via [place] at this time. Tolerated IV initiation well. Lights dimmed for comfort, call bell remains within reach at this time. Pt remains ST on the monitor. Air conditioning turned on per patient request.

## 2020-04-14 NOTE — ED Notes (Signed)
Date and time results received: 04/14/20 8:54 AM  (use smartphrase ".now" to insert current time)  Test: Lactic Critical Value: 3.7  Name of Provider Notified: Manuella Ghazi  Orders Received? Or Actions Taken?: Sent via secure chat, awaiting orders

## 2020-04-14 NOTE — Progress Notes (Signed)
Pt. Refused bipap after being on for 30 minutes.

## 2020-04-14 NOTE — ED Notes (Signed)
This RN spoke with admitting MD Manuella Ghazi regarding patient condition. Pt continues to be tachypneic, wheezing throughout, and tachycardic, patient also noted to be diaphoretic on assessment.

## 2020-04-14 NOTE — ED Notes (Signed)
Critical results acknowledged by Dr. Manuella Ghazi, no new orders received.

## 2020-04-14 NOTE — ED Notes (Signed)
Pt wouldn't tolerate more than 1 inch nasal swab

## 2020-04-14 NOTE — ED Notes (Signed)
Pt's IV noted to be infiltrated to R hand, IV removed by this RN, restarted to R AC at this time.

## 2020-04-14 NOTE — Consult Note (Addendum)
[facility] SURGICAL CONSULTATION NOTE (initial) - cpt: [id number]   HISTORY OF PRESENT ILLNESS (HPI):  59 y.o. female presented to [facility] ED overnight for evaluation of abdominal pain. Patient reports that she was seen in the ED on 08/06 for lower abdominal pain which she described as crampy in nature. She was found to have leukocytosis to 17K. She was offered CT Scan but declined as this felt very similar to her previous presentation for diverticulitis in the past. She was given a course of Augmentin for 7 days. She reports that she initially had gotten better but her pain returned and quickly worsened throughout the course of yesterday. Pain is severe and unresolved even with narcotic pain medication. She has associated nausea but denied fever, chills, cough, congestion, CP, SOB, urinary changes, or bowel changes. Previous abdominal surgeries positive for diagnostic laparoscopy and tubal ligation. Of note, she has a history of HIV (last CD4 329). Work up in the ED this morning was concerning for AKI with sCr - 1.8, BUN - 30, hyponatremia to 133, no leukocytosis and WBC 4.7K, lactic acidosis to 3.7, and CT Abdomen/Pelvis was concerning for complicated sigmoid diverticulitis with pelvic abscess.   Surgery is consulted by emergency medicine physician Dr. Blake Divine, MD in this context for evaluation and management of acute complicated diverticulitis with abscess.  PAST MEDICAL HISTORY (PMH):  Past Medical History:  Diagnosis Date  . Acute GI hemorrhage 12/2015   required transfusion  . Allergic rhinitis   . Arthritis    DDD  . Asthma   . Blepharitis   . Bronchitis   . Cigarette smoker   . Claustrophobia   . COPD (chronic obstructive pulmonary disease) ([place])   . Cough   . DDD (degenerative disc disease), lumbar 04/10/2014  . Depression   . Diverticulosis   . Ectopic pregnancy   . Eye irritation   . Genital herpes   . GERD (gastroesophageal reflux disease)   . Headache   .  HIV (human immunodeficiency virus infection) ([place])   . Hypertension   . Paresthesia of hand, bilateral   . Shortness of breath dyspnea    at times due to asthma     PAST SURGICAL HISTORY [facility]):  Past Surgical History:  Procedure Laterality Date  . BACK SURGERY  12/2014   lumbar lam. Dr. Deri Fuelling  . BACK SURGERY  12/25/2012   Removal lumbosubarachnoid shunt system, L5-S1 TF ESI, Dr. Virl Axe Minchew  . back surgery may 2016     lumbar   . BUNIONECTOMY Bilateral    feet  . COLONOSCOPY  12/06/2014   rescheduled from 11/01/2014 due to poor prep  . DIAGNOSTIC LAPAROSCOPY    . DISTAL FEMUR OSTEOTOMY Right    Dr. Earnestine Leys  . ESOPHAGOGASTRODUODENOSCOPY Left 01/11/2016   Procedure: ESOPHAGOGASTRODUODENOSCOPY (EGD);  Surgeon: Hulen Luster, MD;  Location: [facility] ENDOSCOPY;  Service: Endoscopy;  Laterality: Left;  . Finger fracture Right    5th finger  . FRACTURE SURGERY Right 01/25/2014   closed reduction & percutaneous pinning of 5th digit  . HAND SURGERY Bilateral    carpal tunnel  . JOINT REPLACEMENT Bilateral    knees  . KNEE SURGERY Bilateral 2003,2007   total Joint  . LAPAROSCOPY FOR ECTOPIC PREGNANCY    . PERIPHERAL VASCULAR CATHETERIZATION N/A 01/12/2016   Procedure: Visceral Angiography;  Surgeon: Algernon Huxley, MD;  Location: [facility] LAB;  Service: Cardiovascular;  Laterality: N/A;  . PERIPHERAL VASCULAR CATHETERIZATION N/A 01/12/2016  Procedure: Visceral Artery Intervention;  Surgeon: Algernon Huxley, MD;  Location: [facility];  Service: Cardiovascular;  Laterality: N/A;  . REVISION TOTAL KNEE ARTHROPLASTY Right 12/31/2002   Dr. Marry Guan  . TUBAL LIGATION    . VIDEO BRONCHOSCOPY N/A 06/30/2015   Procedure: VIDEO BRONCHOSCOPY WITHOUT FLUORO;  Surgeon: Vilinda Boehringer, MD;  Location: [facility] ORS;  Service: Cardiopulmonary;  Laterality: N/A;     MEDICATIONS:  Prior to Admission medications   Medication Sig Start Date End Date Taking? Authorizing Provider   amLODipine (NORVASC) 10 MG tablet Take 10 mg by mouth daily.    Yes [provider]  DEXILANT 60 MG capsule Take 60 mg by mouth daily. 04/25/19  Yes [provider]  gabapentin (NEURONTIN) 600 MG tablet Take 600 mg by mouth 3 (three) times daily. 04/02/20  Yes [provider]  hydrochlorothiazide (HYDRODIURIL) 25 MG tablet Take 25 mg by mouth daily.   Yes [provider]  HYDROcodone-acetaminophen (NORCO/VICODIN) 5-325 MG tablet Take 1 tablet by mouth every 6 (six) hours as needed for severe pain. 04/04/20  Yes Lavonia Drafts, MD  losartan (COZAAR) 50 MG tablet Take 50 mg by mouth daily.   Yes [provider]  methocarbamol (ROBAXIN) 500 MG tablet Take 500 mg by mouth in the morning, at noon, in the evening, and at bedtime. 03/31/20  Yes [provider]  Grant Ruts INHUB 250-50 MCG/DOSE AEPB Inhale 1 puff into the lungs 2 (two) times daily. 11/05/19  Yes [provider]  albuterol (VENTOLIN HFA) 108 (90 Base) MCG/ACT inhaler Inhale 2 puffs into the lungs every 6 (six) hours as needed for wheezing or shortness of breath. 05/21/19   Laban Emperor, PA-C  elvitegravir-cobicistat-emtricitabine-tenofovir (GENVOYA) 150-150-200-10 MG TABS tablet Take 1 tablet by mouth at bedtime.    [provider]  famotidine (PEPCID) 20 MG tablet Take 20 mg by mouth daily.  05/08/19   [provider]  traMADol (ULTRAM) 50 MG tablet Take 50 mg by mouth every 6 (six) hours as needed. 04/03/20   [provider]  valACYclovir (VALTREX) 1000 MG tablet Take 1,000 mg by mouth at bedtime.  04/25/19   [provider]     ALLERGIES:  Allergies  Allergen Reactions  . Acetaminophen Nausea And Vomiting  . Ibuprofen Other (See Comments)    Reports causes bleeding  . Gabapentin Rash  . Morphine Itching and Rash  . Morphine And Related Itching  . Zanaflex  [Tizanidine] Rash     SOCIAL HISTORY:  Social History   Socioeconomic History  .  Marital status: Single    Spouse name: Not on file  . Number of children: Not on file  . Years of education: Not on file  . Highest education level: Not on file  Occupational History  . Not on file  Tobacco Use  . Smoking status: Current Some Day Smoker    Packs/day: 0.25    Years: 38.00    Pack years: 9.50    Types: Cigarettes  . Smokeless tobacco: Never Used  . Tobacco comment: 1 cig daily  Vaping Use  . Vaping Use: Never used  Substance and Sexual Activity  . Alcohol use: Yes    Alcohol/week: 5.0 standard drinks    Types: 5 Glasses of wine per week    Comment: occ  . Drug use: Not Currently  . Sexual activity: Not on file  Other Topics Concern  . Not on file  Social History Narrative  . Not on file  Social Determinants of Health   Financial Resource Strain:   . Difficulty of Paying Living Expenses:   Food Insecurity:   . Worried About [name] fundraiser in the Last Year:   . Arboriculturist in the Last Year:   Transportation Needs:   . Film/video editor (Medical):   Marland Kitchen Lack of Transportation (Non-Medical):   Physical Activity:   . Days of Exercise per Week:   . Minutes of Exercise per Session:   Stress:   . Feeling of Stress :   Social Connections:   . Frequency of Communication with Friends and Family:   . Frequency of Social Gatherings with Friends and Family:   . Attends Religious Services:   . Active Member of Clubs or Organizations:   . Attends Archivist Meetings:   Marland Kitchen Marital Status:   Intimate Partner Violence:   . Fear of Current or Ex-Partner:   . Emotionally Abused:   Marland Kitchen Physically Abused:   . Sexually Abused:      FAMILY HISTORY:  Family History  Problem Relation Age of Onset  . Breast cancer Sister       REVIEW OF SYSTEMS:  Review of Systems  Constitutional: Negative for chills and fever.  HENT: Negative for congestion and sore throat.   Respiratory: Positive for shortness of breath (Baseline) and wheezing (Baseline).  Negative for cough.   Cardiovascular: Negative for chest pain and palpitations.  Gastrointestinal: Positive for abdominal pain and nausea. Negative for constipation, diarrhea and vomiting.  Genitourinary: Negative for dysuria and urgency.  All other systems reviewed and are negative.   VITAL SIGNS:  Temp:  [97.5 F (36.4 C)-97.9 F (36.6 C)] 97.9 F (36.6 C) (08/16 0206) Pulse Rate:  [78-138] 138 (08/16 0630) Resp:  [20-22] 20 (08/16 0230) BP: (112-151)/(69-98) 129/89 (08/16 0630) SpO2:  [92 %-98 %] 92 % (08/16 0630) Weight:  [117.9 kg] 117.9 kg (08/15 2127)     Height: 5\' 9"  (175.3 cm) Weight: 117.9 kg BMI (Calculated): 38.38   INTAKE/OUTPUT:  08/15 0701 - 08/16 0700 In: 1050 [IV Piggyback:1050] Out: -   PHYSICAL EXAM:  Physical Exam Vitals and nursing note reviewed. Exam conducted with a chaperone present.  Constitutional:      General: She is not in acute distress.    Appearance: She is well-developed. She is obese. She is not ill-appearing.  HENT:     Head: Normocephalic and atraumatic.  Eyes:     General: No scleral icterus.    Extraocular Movements: Extraocular movements intact.  Cardiovascular:     Rate and Rhythm: Regular rhythm. Tachycardia present.     Heart sounds: Normal heart sounds.  Pulmonary:     Effort: Pulmonary effort is normal. No respiratory distress.     Breath sounds: Wheezing (Audible expiratory and inspiratroy wheezing, which she states is her baseline) present.  Abdominal:     General: Abdomen is protuberant. A surgical scar is present.     Palpations: Abdomen is soft.     Tenderness: There is abdominal tenderness in the suprapubic area and left lower quadrant. There is no guarding or rebound.  Genitourinary:    Comments: Deferred Skin:    General: Skin is warm and dry.     Coloration: Skin is not jaundiced or pale.  Neurological:     General: No focal deficit present.     Mental Status: She is alert and oriented to person, place, and time.   Psychiatric:  Mood and Affect: Mood normal.        Behavior: Behavior normal.      Labs:  CBC Latest Ref Rng & Units 04/14/2020 04/13/2020 04/04/2020  WBC 4.0 - 10.5 K/uL 4.7 7.8 17.2(H)  Hemoglobin 12.0 - 15.0 g/dL 14.7 13.4 13.0  Hematocrit 36 - 46 % 42.7 40.4 37.3  Platelets 150 - 400 K/uL 296 354 269   CMP Latest Ref Rng & Units 04/14/2020 04/13/2020 04/04/2020  Glucose 70 - 99 mg/dL 153(H) 125(H) 100(H)  BUN 6 - 20 mg/dL 30(H) 25(H) 26(H)  Creatinine 0.44 - 1.00 mg/dL 1.28(H) 0.80 1.03(H)  Sodium 135 - 145 mmol/L 133(L) 135 138  Potassium 3.5 - 5.1 mmol/L 4.5 3.8 4.1  Chloride 98 - 111 mmol/L 100 101 105  CO2 22 - 32 mmol/L 18(L) 23 25  Calcium 8.9 - 10.3 mg/dL 8.4(L) 8.6(L) 8.6(L)  Total Protein 6.5 - 8.1 g/dL - 6.4(L) 6.4(L)  Total Bilirubin 0.3 - 1.2 mg/dL - 0.6 0.9  Alkaline Phos 38 - 126 U/L - 89 75  AST 15 - 41 U/L - 25 19  ALT 0 - 44 U/L - 31 27    Imaging studies:   CT Abdomen/Pelvis (04/14/2020) personally reviewed (unsure why second CT ordered this morning but appears unchanged) concerning for sigmoid diverticulitis with anterior pelvic fluid collection concerning for abscess, and radiologist report reviewed:  IMPRESSION: 1. Findings consistent with acute sigmoid colon diverticulitis. 7.5 x 4.4 cm gas and fluid collection within the anterior pelvis suspicious for contained perforation/abscess, this appears to communicate with the thickened inflamed sigmoid colon. 2. Small amount of upper abdominal and pelvic ascites. 3. Thickened appearance of pelvic small bowel loops, likely reactive   Assessment/Plan: (ICD-10's: K57.20) 59 y.o. female with LLQ abdominal pain concerning for acute complicated diverticulitis, complicated by pertinent comorbidities including COPD, HIV.   - Appreciate medicine admission  - Recommend IR placement of percutaneous drainage, plan for later this afternoon, culture aspirate  - No emergent surgical intervention; we will follow  closely. If she developed any evidence of peritonitis, worsening sepsis or lactic acidosis, or fails conservative measures she understands that we may need to intervene emergently and this will result in temporizing colostomy  - Continue IV Abx; agree switching to Ceftriaxone/Flagyl as she had failed Augmentin outpatient   - Pain control prn; antiemetics prn  - Continue IVF resuscitation --> monitor AKI  - Monitor abdominal examination  - Monitor fever curve, leukocytosis, lactic acid levels  - Mobilization as tolerated   - DVT prophylaxis; HOLD  - Further management per primary service   All of the above findings and recommendations were discussed with the patient, and all of patient's questions were answered to her expressed satisfaction.  Thank you for the opportunity to participate in this patient's care.   -- Edison Simon, PA-C [facility] 04/14/2020, 7:48 AM [phone] M-F: 7am - 4pm

## 2020-04-14 NOTE — Procedures (Signed)
Interventional Radiology Procedure:   Indications: Colonic diverticular abscess  Procedure: CT guided drain placement  Findings: 12 Fr drain placed in pelvic abscess.  40 ml of foul-smelling gray colored fluid removed along with gas.  Abscess was decompressed after drain placement.  Complications: None     EBL: less than 10 ml  Plan: Send fluid for culture.  Follow output.  Patient will likely need to keep drain as outpatient.  Will need follow up CT and drain injection prior to drain removal.       Melda Mermelstein R. Anselm Pancoast, MD  Pager: [phone]

## 2020-04-15 ENCOUNTER — Inpatient Hospital Stay: Payer: Medicare Other

## 2020-04-15 DIAGNOSIS — Z7189 Other specified counseling: Secondary | ICD-10-CM

## 2020-04-15 DIAGNOSIS — Z515 Encounter for palliative care: Secondary | ICD-10-CM

## 2020-04-15 DIAGNOSIS — K5732 Diverticulitis of large intestine without perforation or abscess without bleeding: Secondary | ICD-10-CM | POA: Diagnosis not present

## 2020-04-15 DIAGNOSIS — E872 Acidosis: Secondary | ICD-10-CM

## 2020-04-15 DIAGNOSIS — K5720 Diverticulitis of large intestine with perforation and abscess without bleeding: Secondary | ICD-10-CM | POA: Diagnosis not present

## 2020-04-15 DIAGNOSIS — N170 Acute kidney failure with tubular necrosis: Secondary | ICD-10-CM

## 2020-04-15 DIAGNOSIS — F17210 Nicotine dependence, cigarettes, uncomplicated: Secondary | ICD-10-CM

## 2020-04-15 DIAGNOSIS — N179 Acute kidney failure, unspecified: Secondary | ICD-10-CM

## 2020-04-15 DIAGNOSIS — R0603 Acute respiratory distress: Secondary | ICD-10-CM

## 2020-04-15 DIAGNOSIS — R7401 Elevation of levels of liver transaminase levels: Secondary | ICD-10-CM

## 2020-04-15 LAB — HELPER T-LYMPH-CD4 (ARMC ONLY)
% CD 4 Pos. Lymph.: 33.1 % (ref 30.8–58.5)
Absolute CD 4 Helper: 265 /uL — ABNORMAL LOW (ref 359–1519)
Basophils Absolute: 0 10*3/uL (ref 0.0–0.2)
Basos: 0 %
EOS (ABSOLUTE): 0 10*3/uL (ref 0.0–0.4)
Eos: 0 %
Hematocrit: 42.5 % (ref 34.0–46.6)
Hemoglobin: 14 g/dL (ref 11.1–15.9)
Immature Grans (Abs): 0 10*3/uL (ref 0.0–0.1)
Immature Granulocytes: 1 %
Lymphocytes Absolute: 0.8 10*3/uL (ref 0.7–3.1)
Lymphs: 15 %
MCH: 30.2 pg (ref 26.6–33.0)
MCHC: 32.9 g/dL (ref 31.5–35.7)
MCV: 92 fL (ref 79–97)
Monocytes Absolute: 0.2 10*3/uL (ref 0.1–0.9)
Monocytes: 3 %
NRBC: 1 % — ABNORMAL HIGH (ref 0–0)
Neutrophils Absolute: 4.2 10*3/uL (ref 1.4–7.0)
Neutrophils: 81 %
Platelets: 333 10*3/uL (ref 150–450)
RBC: 4.64 x10E6/uL (ref 3.77–5.28)
RDW: 17.1 % — ABNORMAL HIGH (ref 11.7–15.4)
WBC: 5.1 10*3/uL (ref 3.4–10.8)

## 2020-04-15 LAB — URINALYSIS, COMPLETE (UACMP) WITH MICROSCOPIC
Bacteria, UA: NONE SEEN
Bilirubin Urine: NEGATIVE
Glucose, UA: NEGATIVE mg/dL
Hgb urine dipstick: NEGATIVE
Ketones, ur: NEGATIVE mg/dL
Leukocytes,Ua: NEGATIVE
Nitrite: NEGATIVE
Protein, ur: NEGATIVE mg/dL
Specific Gravity, Urine: >1.046 — ABNORMAL HIGH (ref 1.005–1.030)
pH: 5 (ref 5.0–8.0)

## 2020-04-15 LAB — COMPREHENSIVE METABOLIC PANEL
ALT: 853 U/L — ABNORMAL HIGH (ref 0–44)
AST: 1634 U/L — ABNORMAL HIGH (ref 15–41)
Albumin: 1.7 g/dL — ABNORMAL LOW (ref 3.5–5.0)
Alkaline Phosphatase: 60 U/L (ref 38–126)
Anion gap: 19 — ABNORMAL HIGH (ref 5–15)
BUN: 44 mg/dL — ABNORMAL HIGH (ref 6–20)
CO2: 12 mmol/L — ABNORMAL LOW (ref 22–32)
Calcium: 7 mg/dL — ABNORMAL LOW (ref 8.9–10.3)
Chloride: 104 mmol/L (ref 98–111)
Creatinine, Ser: 3.23 mg/dL — ABNORMAL HIGH (ref 0.44–1.00)
GFR calc Af Amer: 17 mL/min — ABNORMAL LOW (ref >60)
GFR calc non Af Amer: 15 mL/min — ABNORMAL LOW (ref >60)
Glucose, Bld: 110 mg/dL — ABNORMAL HIGH (ref 70–99)
Potassium: 5.2 mmol/L — ABNORMAL HIGH (ref 3.5–5.1)
Sodium: 135 mmol/L (ref 135–145)
Total Bilirubin: 0.6 mg/dL (ref 0.3–1.2)
Total Protein: 4.4 g/dL — ABNORMAL LOW (ref 6.5–8.1)

## 2020-04-15 LAB — CBC
HCT: 36.8 % (ref 36.0–46.0)
Hemoglobin: 11.9 g/dL — ABNORMAL LOW (ref 12.0–15.0)
MCH: 30.7 pg (ref 26.0–34.0)
MCHC: 32.3 g/dL (ref 30.0–36.0)
MCV: 95.1 fL (ref 80.0–100.0)
Platelets: 149 10*3/uL — ABNORMAL LOW (ref 150–400)
RBC: 3.87 MIL/uL (ref 3.87–5.11)
RDW: 20.3 % — ABNORMAL HIGH (ref 11.5–15.5)
WBC: 17.2 10*3/uL — ABNORMAL HIGH (ref 4.0–10.5)
nRBC: 2.5 % — ABNORMAL HIGH (ref 0.0–0.2)

## 2020-04-15 LAB — LACTIC ACID, PLASMA
Lactic Acid, Venous: 7.2 mmol/L (ref 0.5–1.9)
Lactic Acid, Venous: 10.9 mmol/L (ref 0.5–1.9)
Lactic Acid, Venous: 6.1 mmol/L (ref 0.5–1.9)
Lactic Acid, Venous: 5.7 mmol/L (ref 0.5–1.9)

## 2020-04-15 LAB — HIV-1 RNA QUANT-NO REFLEX-BLD
HIV 1 RNA Quant: <20 copies/mL
LOG10 HIV-1 RNA: UNDETERMINED log10copy/mL

## 2020-04-15 LAB — PROCALCITONIN: Procalcitonin: 105.59 ng/mL

## 2020-04-15 MED ORDER — AMIODARONE HCL IN DEXTROSE 360-4.14 MG/200ML-% IV SOLN: 60.0000 mg/h | INTRAVENOUS | Status: AC

## 2020-04-15 MED ORDER — SODIUM CHLORIDE 0.9 % IV SOLN
1.0000 g | Freq: Two times a day (BID) | INTRAVENOUS | Status: DC
  Administered 2020-04-15 – 2020-04-22 (×15): 1 g via INTRAVENOUS
  Filled 2020-04-15 (×20): qty 1

## 2020-04-15 MED ORDER — SODIUM CHLORIDE 0.9 % IV SOLN
200.0000 mg | Freq: Once | INTRAVENOUS | Status: AC
  Administered 2020-04-16: 200 mg via INTRAVENOUS
  Filled 2020-04-15: qty 200

## 2020-04-15 MED ORDER — ALBUMIN HUMAN 25 % IV SOLN
25.0000 g | Freq: Once | INTRAVENOUS | Status: AC
  Administered 2020-04-15: 25 g via INTRAVENOUS
  Filled 2020-04-15: qty 100

## 2020-04-15 MED ORDER — NOREPINEPHRINE 4 MG/250ML-% IV SOLN: 2.0000 ug/min | INTRAVENOUS | Status: DC

## 2020-04-15 MED ORDER — AMIODARONE HCL IN DEXTROSE 360-4.14 MG/200ML-% IV SOLN
INTRAVENOUS | Status: AC
  Filled 2020-04-15: qty 200

## 2020-04-15 MED ORDER — PANTOPRAZOLE SODIUM 40 MG IV SOLR
40.0000 mg | Freq: Every day | INTRAVENOUS | Status: DC
  Administered 2020-04-15 – 2020-04-24 (×9): 40 mg via INTRAVENOUS
  Filled 2020-04-15 (×10): qty 40

## 2020-04-15 MED ORDER — PIPERACILLIN-TAZOBACTAM 3.375 G IVPB
3.3750 g | Freq: Three times a day (TID) | INTRAVENOUS | Status: DC
  Administered 2020-04-15: 3.375 g via INTRAVENOUS
  Filled 2020-04-15: qty 50

## 2020-04-15 MED ORDER — ENOXAPARIN SODIUM 30 MG/0.3ML ~~LOC~~ SOLN
30.0000 mg | SUBCUTANEOUS | Status: DC
  Administered 2020-04-16: 30 mg via SUBCUTANEOUS
  Filled 2020-04-15: qty 0.3

## 2020-04-15 MED ORDER — VALACYCLOVIR HCL 500 MG PO TABS
500.0000 mg | ORAL_TABLET | Freq: Every day | ORAL | Status: DC
  Administered 2020-04-15: 500 mg via ORAL
  Filled 2020-04-15 (×2): qty 1

## 2020-04-15 MED ORDER — SODIUM CHLORIDE 0.9 % IV SOLN
250.0000 mL | INTRAVENOUS | Status: DC
  Administered 2020-04-15 – 2020-05-03 (×8): 250 mL via INTRAVENOUS

## 2020-04-15 MED ORDER — SODIUM CHLORIDE 0.9 % IV BOLUS
1000.0000 mL | Freq: Once | INTRAVENOUS | Status: AC
  Administered 2020-04-15: 1000 mL via INTRAVENOUS

## 2020-04-15 MED ORDER — SODIUM BICARBONATE 8.4 % IV SOLN
INTRAVENOUS | Status: AC
  Filled 2020-04-15: qty 500

## 2020-04-15 MED ORDER — AMIODARONE HCL IN DEXTROSE 360-4.14 MG/200ML-% IV SOLN: 30.0000 mg/h | INTRAVENOUS | Status: DC

## 2020-04-15 MED ORDER — SODIUM BICARBONATE BOLUS VIA INFUSION
INTRAVENOUS | Status: AC
  Administered 2020-04-15: 300 meq via INTRAVENOUS
  Filled 2020-04-15: qty 1

## 2020-04-15 NOTE — Progress Notes (Signed)
D/w with daughter in detail. Updated her about the situation. She understands.

## 2020-04-15 NOTE — Progress Notes (Addendum)
[facility] [place] NAME: Melanie Bowen    MR#:  [id number]  DATE OF BIRTH:  [birth year]  SUBJECTIVE:  CHIEF COMPLAINT:   Chief Complaint  Patient presents with  . Abdominal Pain  Patient sleeping, mouth breather.  No new issues. Daughter at bedside REVIEW OF SYSTEMS:  Review of Systems  Unable to perform ROS: Critical illness    DRUG ALLERGIES:   Allergies  Allergen Reactions  . Acetaminophen Nausea And Vomiting  . Ibuprofen Other (See Comments)    Reports causes bleeding  . Gabapentin Rash  . Morphine Itching and Rash  . Morphine And Related Itching  . Zanaflex  [Tizanidine] Rash   VITALS:  Blood pressure (!) 90/48, pulse 100, temperature 98.8 F (37.1 C), temperature source Axillary, resp. rate 13, height 5\' 6"  (1.676 m), weight 121.6 kg, SpO2 98 %. PHYSICAL EXAMINATION:  Physical Exam Constitutional:      General: She is sleeping.     Appearance: She is obese.  HENT:     Head: Normocephalic and atraumatic.  Eyes:     Conjunctiva/sclera: Conjunctivae normal.     Pupils: Pupils are equal, round, and reactive to light.  Neck:     Thyroid: No thyromegaly.     Trachea: No tracheal deviation.  Cardiovascular:     Rate and Rhythm: Normal rate and regular rhythm.     Heart sounds: Normal heart sounds.  Pulmonary:     Effort: Pulmonary effort is normal. No respiratory distress.     Breath sounds: Normal breath sounds. No wheezing.  Chest:     Chest wall: No tenderness.  Abdominal:     General: Bowel sounds are normal. There is no distension.     Palpations: Abdomen is soft.     Tenderness: There is generalized abdominal tenderness.     Comments: JP drain present in the left lower quadrant draining serosanguineous drainage  Musculoskeletal:        General: Normal range of motion.     Cervical back: Normal range of motion and neck supple.  Skin:    General: Skin is warm and dry.     Findings: No rash.  Neurological:      Mental Status: She is oriented to person, place, and time.     Cranial Nerves: No cranial nerve deficit.   Foley inserted on 8/17 LABORATORY PANEL:  Female CBC Recent Labs  Lab 04/15/20 0609  WBC 17.2*  HGB 11.9*  HCT 36.8  PLT 149*   ------------------------------------------------------------------------------------------------------------------ Chemistries  Recent Labs  Lab 04/13/20 2140 04/14/20 0648 04/15/20 0609  NA 135   < > 135  K 3.8   < > 5.2*  CL 101   < > 104  CO2 23   < > 12*  GLUCOSE 125*   < > 110*  BUN 25*   < > 44*  CREATININE 0.80   < > 3.23*  CALCIUM 8.6*   < > 7.0*  MG 1.8  --   --   AST 25   < > 1,634*  ALT 31   < > 853*  ALKPHOS 89   < > 60  BILITOT 0.6   < > 0.6   < > = values in this interval not displayed.   RADIOLOGY:  DG Abd 1 View  Result Date: 04/14/2020 CLINICAL DATA:  Abdominal pain.  Pelvic abscess drainage today. EXAM: ABDOMEN - 1 VIEW COMPARISON:  CT abdomen pelvis  from same day. FINDINGS: The bowel gas pattern is normal. No intraperitoneal free air peer no radio-opaque calculi or other significant radiographic abnormality are seen. No acute osseous abnormality. Prior lumbar fusion. IMPRESSION: 1. Negative. Electronically Signed   By: Titus Dubin M.D.   On: 04/14/2020 14:37   CT ABDOMEN PELVIS W CONTRAST  Result Date: 04/14/2020 CLINICAL DATA:  Peritonitis or perforation suspected Patient with history of perforated diverticulitis post percutaneous drain placement today. EXAM: CT ABDOMEN AND PELVIS WITH CONTRAST TECHNIQUE: Multidetector CT imaging of the abdomen and pelvis was performed using the standard protocol following bolus administration of intravenous contrast. CONTRAST:  51mL OMNIPAQUE IOHEXOL 300 MG/ML  SOLN COMPARISON:  Two prior abdominopelvic CT earlier this day, prior to drain placement. FINDINGS: Lower chest: Small pleural effusions are new from prior exam. Adjacent compressive atelectasis. Breathing motion artifact in the  lung bases limits assessment. Hepatobiliary: Small amount of perihepatic fluid slightly increased from prior exam. This appears mildly complex. High-density contents in the urinary bladder increasing likely vicarious excretion of IV contrast. There is no discrete focal hepatic abnormality allowing for motion. Pancreas: Motion artifact through the pancreas. No obvious inflammation. Spleen: Normal allowing for motion. Adrenals/Urinary Tract: No adrenal nodule. There is heterogeneous enhancement of both kidneys. No significant perinephric edema. Minimal excretion from both kidneys on delayed phase imaging. There is excreted IV contrast within the urinary bladder. Stomach/Bowel: New surgical drain within the pelvic fluid collection. No definite residual fluid. A drain is closely approximated to fecalized loops of small bowel in the pelvis. Motion artifact limits detailed bowel assessment. Similar appearance of scattered small bowel thickening from prior exam which is likely reactive. There is no evidence of obstruction. No bowel pneumatosis. Diverticulosis involving the colon. No new colonic inflammation or diverticulitis. Air-fluid level in the stomach with small amount of fluid in the distal esophagus. Vascular/Lymphatic: Aortic atherosclerosis. No aortic aneurysm. No evidence of portal venous or mesenteric gas. No bulky adenopathy. Reproductive: Uterus and bilateral adnexa are unremarkable. Other: Surgical drain within the pelvic fluid collection which appears completely decompressed. No definite residual collection. Generalized fat stranding involving the lower small bowel mesentery. Small amount of non organized free fluid in the dependent pelvis, tracking into the pericolic gutters and right upper quadrant. There is no definite free air. Musculoskeletal: Stable. Postsurgical change in the lower lumbar spine. IMPRESSION: 1. Interval percutaneous drainage of pelvic abscess. No residual fluid collection. 2. Small  amount of non organized free fluid in the pelvis, pericolic gutters, and perihepatic right upper quadrant, slight increase from pre drainage CT earlier today. No evidence of new abscess. No free air or interval perforation. 3. Heterogeneous enhancement of both kidneys, nonspecific but can be seen with pyelonephritis. 4. Small pleural effusions are new from prior exam. Adjacent compressive atelectasis. Electronically Signed   By: Keith Rake M.D.   On: 04/14/2020 22:22   DG Abd Portable 2V  Result Date: 04/15/2020 CLINICAL DATA:  Diverticulitis.  Abdominal pain and distention. EXAM: PORTABLE ABDOMEN - 2 VIEW COMPARISON:  CT 04/14/2020. FINDINGS: Pelvic drainage catheter again noted. No bowel distention. Stool noted throughout the colon. No free air. Bibasilar atelectasis. Degenerative change lumbar spine and both hips. Contrast in the bladder from prior CT. IMPRESSION: 1.  Pelvic drainage catheter in stable position. 2. No acute abdominal abnormality identified. No bowel distention or free air. 3.  Bibasilar atelectasis. Electronically Signed   By: Marcello Moores  Register   On: 04/15/2020 08:07   ASSESSMENT AND PLAN:   Septic shock -  present on admission with hypotension, tachycardia, With multiorgan failure - acute liver and kidney failure -Levophed as need per PCCM -Source of infection is diverticular abscess status post CT-guided drainage.  Pelvic drainage catheter in stable position.  Surgery following.  Repeat CT scan shows last night on 8/16 no worsening. -Consult ID, intensivist following -Continue IV Rocephin and Flagyl for intra-abdominal infection -Lactic acidosis worsening - tenofovir can cause severe lactic acidosis. ->  Genvoya is stopped for now. -Abdominal x-ray not showing any free air  Acute hypoxic respiratory failure secondary to COPD exacerbation Patient refused BiPAP, supplemental oxygen as needed  Acute liver-kidney failure & Hyperkalemia likely from septic shock and ATN Foley  catheter placed on 8/17 per surgery/PCCM Creatinine worsened from 0.8->1.28-> 1.78-> 3.23 AST of 1634, ALT 853 Avoid hepatotoxic and nephrotoxic medications  Acute sigmoid diverticulitis and diverticular abscess s/p CT-guided drainage by IR on 8/16 Surgery following  Morbid obesity Complicates overall care Body mass index is 43.27 kg/m.     Palliative care c/s for GOC  Status is: Inpatient  Remains inpatient appropriate because:Hemodynamically unstable and IV treatments appropriate due to intensity of illness or inability to take PO   Dispo: The patient is from: Home              Anticipated d/c is to: Home              Anticipated d/c date is: > 3 days              Patient currently is not medically stable to d/c.    DVT prophylaxis:            enoxaparin (LOVENOX) injection 40 mg Start: 04/14/20 0245     Family Communication: discussed with Daughter at bedside   All the records are reviewed and case discussed with Care Management/Social Worker. Management plans discussed with the patient, family (Daughter at bedside) and they are in agreement.  CODE STATUS: Partial Code  TOTAL TIME TAKING CARE OF THIS PATIENT: 35 minutes.   More than 50% of the time was spent in counseling/coordination of care: YES  POSSIBLE D/C IN 3-4 DAYS, DEPENDING ON CLINICAL CONDITION.   Max Sane M.D on 04/15/2020 at 12:43 PM  [facility]   CC: Primary care physician; Alene Mires Elyse Jarvis, MD  Note: This dictation was prepared with Dragon dictation along with smaller phrase technology. Any transcriptional errors that result from this process are unintentional.

## 2020-04-15 NOTE — Consult Note (Signed)
Consultation Note Date: 04/15/2020   Patient Name: Melanie Bowen  DOB: 18-Mar-1961  MRN: [id number]  Age / Sex: 59 y.o., female  PCP: Revelo, Elyse Jarvis, MD Referring Physician: Max Sane, MD  Reason for Consultation: Establishing goals of care and Psychosocial/spiritual support  HPI/Patient Profile: 59 y.o. female admitted on 8/16/2021with a known history of COPD/asthma, hypertension, diverticulosis, HIV, GERD and depression, presented to the emergency room with acute onset of worsening left lower quadrant abdominal pain with associated nausea without vomiting as well as constipation.  The patient started having pain 9 days ago when she was seen in the ER and given p.o. Augmentin which helped improve her symptoms some until her pain got worse since yesterday.     Abdominal and pelvic CT scan revealed the following: 1. Findings consistent with acute sigmoid colon diverticulitis. 7.5 x 4.4 cm gas and fluid collection within the anterior pelvis suspicious for contained perforation/abscess, this appears to communicate with the thickened inflamed sigmoid colon. 2. Small amount of upper abdominal and pelvic ascites. 3. Thickened appearance of pelvic small bowel loops, likely reactive  Patient admitted for treatment and stabilization. Repeat CT the abdomen was negative for pneumoperitoneum.  Currently patient is weak, daughter is at bedside. She is tachypneic, refusing BiPAP and intubation now or into the future.    Palliative medicine team consulted by attending for clarification of goals of care.   Clinical Assessment and Goals of Care:  This NP Wadie Lessen reviewed medical records, received report from team, assessed the patient and then meet at the patient's bedside along with her daughter/Melanie Bowen to discuss diagnosis, prognosis, GOC, EOL wishes disposition and options.   Concept of  Palliative Care was introduced as specialized medical care for people and their families living with serious illness.  If focuses on providing relief from the symptoms and stress of a serious illness.  The goal is to improve quality of life for both the patient and the family.  Values and goals of care important to patient and family were attempted to be elicited.  A  discussion was had today regarding advanced directives.  Concepts specific to code status was had.  Previous discussion with Dr. Mortimer Fries revealed patient did not desire intubation, however she was okay with chest compressions and cardioversion.  Education offered regarding the seriousness of her current medical situation.    Education offered on interventions specific to intubation and BiPAP  Questions and concerns were addressed to the best of my ability.  Patient remains steadfast in her decision for no intubation and no BiPAP, daughter supports the patient's decisions  The difference between a aggressive medical intervention path  and a palliative comfort care path for this patient at this time in this situation was had.     Questions and concerns addressed.  Patient  encouraged to call with questions or concerns.     PMT will continue to support holistically.          There is no documented healthcare power of attorney or advanced  directive.  Patient has 2 daughters who act in unison for the patient's best interest in the event that the patient cannot make her own decisions.  Encouraged patient and family to consider documentation of H POA and AD during this hospitalization with the support of spiritual care department.     SUMMARY OF RECOMMENDATIONS    Code Status/Advance Care Planning:  Limited code   Symptom Management:   Education offered regarding the patient's risk of decompensation and the importance of clarification for the use of comfort measures in the event patient was decompensating from a respiratory  standpoint,  especially since she has declined intubation.. Family agree that comfort would be a priority and agree with utilization  of medications to enhance comfort in that situation.  Palliative Prophylaxis:   Aspiration, Bowel Regimen, Delirium Protocol, Frequent Pain Assessment and Oral Care  Additional Recommendations (Limitations, Scope, Preferences):  Full Scope Treatment  Psycho-social/Spiritual:   Desire for further Chaplaincy support:yes   Prognosis:   Unable to determine  Discharge Planning: To Be Determined      Primary Diagnoses: Present on Admission: . Abscess of sigmoid colon due to diverticulitis   I have reviewed the medical record, interviewed the patient and family, and examined the patient. The following aspects are pertinent.  Past Medical History:  Diagnosis Date  . Acute GI hemorrhage 12/2015   required transfusion  . Allergic rhinitis   . Arthritis    DDD  . Asthma   . Blepharitis   . Bronchitis   . Cigarette smoker   . Claustrophobia   . COPD (chronic obstructive pulmonary disease) ([place])   . Cough   . DDD (degenerative disc disease), lumbar 04/10/2014  . Depression   . Diverticulosis   . Ectopic pregnancy   . Eye irritation   . Genital herpes   . GERD (gastroesophageal reflux disease)   . Headache   . HIV (human immunodeficiency virus infection) ([place])   . Hypertension   . Paresthesia of hand, bilateral   . Shortness of breath dyspnea    at times due to asthma   Social History   Socioeconomic History  . Marital status: Single    Spouse name: Not on file  . Number of children: Not on file  . Years of education: Not on file  . Highest education level: Not on file  Occupational History  . Not on file  Tobacco Use  . Smoking status: Current Some Day Smoker    Packs/day: 0.25    Years: 38.00    Pack years: 9.50    Types: Cigarettes  . Smokeless tobacco: Never Used  . Tobacco comment: 1 cig daily  Vaping Use  . Vaping  Use: Never used  Substance and Sexual Activity  . Alcohol use: Yes    Alcohol/week: 5.0 standard drinks    Types: 5 Glasses of wine per week    Comment: occ  . Drug use: Not Currently  . Sexual activity: Not on file  Other Topics Concern  . Not on file  Social History Narrative  . Not on file   Social Determinants of Health   Financial Resource Strain:   . Difficulty of Paying Living Expenses:   Food Insecurity:   . Worried About Melanie fundraiser in the Last Year:   . Arboriculturist in the Last Year:   Transportation Needs:   . Film/video editor (Medical):   Marland Kitchen Lack of Transportation (Non-Medical):   Physical Activity:   .  Days of Exercise per Week:   . Minutes of Exercise per Session:   Stress:   . Feeling of Stress :   Social Connections:   . Frequency of Communication with Friends and Family:   . Frequency of Social Gatherings with Friends and Family:   . Attends Religious Services:   . Active Member of Clubs or Organizations:   . Attends Archivist Meetings:   Marland Kitchen Marital Status:    Family History  Problem Relation Age of Onset  . Breast cancer Sister    Scheduled Meds: . budesonide (PULMICORT) nebulizer solution  0.5 mg Nebulization BID  . Chlorhexidine Gluconate Cloth  6 each Topical Daily  . [START ON 04/16/2020] enoxaparin (LOVENOX) injection  30 mg Subcutaneous Q24H  . famotidine  20 mg Oral Daily  . ipratropium-albuterol  3 mL Nebulization Q4H  . methylPREDNISolone (SOLU-MEDROL) injection  40 mg Intravenous Q6H  . mometasone-formoterol  2 puff Inhalation BID  . pantoprazole (PROTONIX) IV  40 mg Intravenous Daily  . sodium chloride flush  5 mL Intracatheter Q8H  . valACYclovir  500 mg Oral QHS   Continuous Infusions: . sodium chloride Stopped (04/15/20 1123)  . sodium chloride 250 mL (04/15/20 0543)  . metronidazole 500 mg (04/15/20 1328)  . norepinephrine (LEVOPHED) Adult infusion    . piperacillin-tazobactam     PRN  Meds:.acetaminophen **OR** acetaminophen, HYDROcodone-acetaminophen, HYDROmorphone (DILAUDID) injection, ipratropium-albuterol, labetalol, magnesium hydroxide Medications Prior to Admission:  Prior to Admission medications   Medication Sig Start Date End Date Taking? Authorizing Provider  amLODipine (NORVASC) 10 MG tablet Take 10 mg by mouth daily.    Yes [provider]  DEXILANT 60 MG capsule Take 60 mg by mouth daily. 04/25/19  Yes [provider]  gabapentin (NEURONTIN) 600 MG tablet Take 600 mg by mouth 3 (three) times daily. 04/02/20  Yes [provider]  hydrochlorothiazide (HYDRODIURIL) 25 MG tablet Take 25 mg by mouth daily.   Yes [provider]  HYDROcodone-acetaminophen (NORCO/VICODIN) 5-325 MG tablet Take 1 tablet by mouth every 6 (six) hours as needed for severe pain. 04/04/20  Yes Lavonia Drafts, MD  losartan (COZAAR) 50 MG tablet Take 50 mg by mouth daily.   Yes [provider]  methocarbamol (ROBAXIN) 500 MG tablet Take 500 mg by mouth in the morning, at noon, in the evening, and at bedtime. 03/31/20  Yes [provider]  Grant Ruts INHUB 250-50 MCG/DOSE AEPB Inhale 1 puff into the lungs 2 (two) times daily. 11/05/19  Yes [provider]  albuterol (VENTOLIN HFA) 108 (90 Base) MCG/ACT inhaler Inhale 2 puffs into the lungs every 6 (six) hours as needed for wheezing or shortness of breath. 05/21/19   Laban Emperor, PA-C  elvitegravir-cobicistat-emtricitabine-tenofovir (GENVOYA) 150-150-200-10 MG TABS tablet Take 1 tablet by mouth at bedtime.    [provider]  famotidine (PEPCID) 20 MG tablet Take 20 mg by mouth daily.  05/08/19   [provider]  traMADol (ULTRAM) 50 MG tablet Take 50 mg by mouth every 6 (six) hours as needed. 04/03/20   [provider]  valACYclovir (VALTREX) 1000 MG tablet Take 1,000 mg by mouth at bedtime.  04/25/19   [provider]   Allergies  Allergen Reactions  .  Acetaminophen Nausea And Vomiting  . Ibuprofen Other (See Comments)    Reports causes bleeding  . Gabapentin Rash  . Morphine Itching and Rash  . Morphine And Related Itching  . Zanaflex  [Tizanidine] Rash   Review of  Systems  Respiratory: Positive for shortness of breath.     Physical Exam Constitutional:      Appearance: She is overweight. She is ill-appearing.     Interventions: Nasal cannula in place.  Cardiovascular:     Rate and Rhythm: Normal rate.  Skin:    General: Skin is warm and dry.  Neurological:     Mental Status: She is lethargic.     Vital Signs: BP (!) 116/58   Pulse 100   Temp 98.8 F (37.1 C) (Axillary)   Resp 15   Ht 5\' 6"  (1.676 m)   Wt 121.6 kg   SpO2 90%   BMI 43.27 kg/m  Pain Scale: 0-10 POSS *See Group Information*: S-Acceptable,Sleep, easy to arouse Pain Score: Asleep   SpO2: SpO2: 90 % O2 Device:SpO2: 90 % O2 Flow Rate: .O2 Flow Rate (L/min): 3 L/min  IO: Intake/output summary:   Intake/Output Summary (Last 24 hours) at 04/15/2020 1334 Last data filed at 04/15/2020 1144 Gross per 24 hour  Intake 4073.22 ml  Output 440 ml  Net 3633.22 ml    LBM: Last BM Date:  (PTA) Baseline Weight: Weight: 117.9 kg Most recent weight: Weight: 121.6 kg     Palliative Assessment/Data:  30 % at this time   Discussed with Dr. Mortimer Fries nd Dr Sheran Luz secure chat  Time In: 1220 Time Out: 1330 Time Total: 70 minutes Greater than 50%  of this time was spent counseling and coordinating care related to the above assessment and plan.  Signed by: Wadie Lessen, NP   Please contact Palliative Medicine Team phone at [phone] for questions and concerns.  For individual provider: See Shea Evans

## 2020-04-15 NOTE — Progress Notes (Signed)
[place] attempted visit per OR for palliative care; pt. appears asleep at this time, no family at bedside; [facility] consulted RN who shared that family has made decision to make pt. DNI --> family not ready at this time to consent to DNR as well, per RN. [place] will pass referral to [place] to look for family during her rounding tomorrow.

## 2020-04-15 NOTE — Progress Notes (Signed)
CHIEF COMPLAINT:   Chief Complaint  Patient presents with  . Abdominal Pain    Subjective  resp status a little better today BP low, not on pressors LA elevated Responds to IVF's Follow up surgery consulted and following     Review of Systems:  Gen:  Denies  fever, sweats, chills weight loss  HEENT: Denies blurred vision, double vision, ear pain, eye pain, hearing loss, nose bleeds, sore throat Cardiac:  No dizziness, chest pain or heaviness, chest tightness,edema, No JVD Resp:    +shortness of breath Gi: Denies swallowing difficulty, stomach pain, nausea or vomiting, diarrhea, constipation, bowel incontinence Other:  All other systems negative  Objective   Examination:  General exam: some what distress +wheezing Respiratory system: Clear to auscultation. +wheezing HEENT: [place]/AT, PERRLA, no thrush, no stridor. Cardiovascular system: S1 & S2 heard, RRR. No JVD, murmurs, rubs, gallops or clicks. No pedal edema. Gastrointestinal system: Abdomen is nondistended, soft and nontender. No organomegaly or masses felt. Normal bowel sounds heard. Central nervous system: Alert and oriented. No focal neurological deficits. Extremities: Symmetric 5 x 5 power. Skin: No rashes, lesions or ulcers Psychiatry: Judgement and insight appear normal. Mood & affect appropriate.   VITALS:  height is 5\' 6"  (1.676 m) and weight is 121.6 kg. Her axillary temperature is 99 F (37.2 C). Her blood pressure is 92/57 (abnormal) and her pulse is 107 (abnormal). Her respiration is 17 and oxygen saturation is 98%.   I personally reviewed Labs under Results section.  Radiology Reports CT ABDOMEN PELVIS WO CONTRAST  Result Date: 04/14/2020 CLINICAL DATA:  Abdominal pain. Contained perforated acute diverticulitis. EXAM: CT ABDOMEN AND PELVIS WITHOUT CONTRAST TECHNIQUE: Multidetector CT imaging of the abdomen and pelvis was performed following the standard protocol without IV contrast. COMPARISON:  CT  04/14/2020, 01/31/2019 FINDINGS: Lower chest: Lung bases are clear. Hepatobiliary: No focal hepatic lesion. No biliary duct dilatation. Common bile duct is normal. Pancreas: Pancreas is normal. No ductal dilatation. No pancreatic inflammation. Spleen: Normal spleen Adrenals/urinary tract: Adrenal glands and kidneys are normal. The ureters and bladder normal. Stomach/Bowel: Stomach duodenum normal. Mildly thickened loops small bowel in the pelvis is favored secondary inflammation related to pelvic abscess. Appendix is partially imaged (image 61/2) appears normal. The ascending and transverse colon normal. There are diverticula descending colon and sigmoid colon. Diverticular disease is heavy in the proximal sigmoid colon. There is a thick-walled fluid collection with the an air-fluid level measuring 8.1 x 4.7 cm in the central anterior upper pelvis. This is adjacent to the sigmoid colon. There is a thin tract between the colon in the fluid collection on image 71/2. Similar pericolonic abscess seen on CT 01/17/2019. Findings most consistent with contained sigmoid perforation with abscess formation. No intraperitoneal free.  Minimal fluid in the pelvis. Vascular/Lymphatic: Abdominal aorta is normal caliber with atherosclerotic calcification. There is no retroperitoneal or periportal lymphadenopathy. No pelvic lymphadenopathy. Reproductive: Post hysterectomy.  Adnexa unremarkable Other: Small volume free fluid the pelvis Musculoskeletal: Posterior lumbar fusion IMPRESSION: 1. No significant interval change in short interval follow-up. 2. Contained sigmoid colon perforation with abscess formation. Perforation presumed related to diverticulitis. Findings similar to May 2020 with increased volume of contained perforation. 3. No intraperitoneal free air. 4. Secondary inflammation of the small bowel. Electronically Signed   By: Suzy Bouchard M.D.   On: 04/14/2020 08:08   DG Chest 1 View  Result Date:  04/14/2020 CLINICAL DATA:  Dyspnea EXAM: CHEST  1 VIEW COMPARISON:  11/06/2019 FINDINGS: Mildly low  lung volumes. There is no edema, consolidation, effusion, or pneumothorax. Normal heart size and mediastinal contours. Artifact from EKG leads IMPRESSION: No evidence of acute disease. Electronically Signed   By: Monte Fantasia M.D.   On: 04/14/2020 07:17   DG Abd 1 View  Result Date: 04/14/2020 CLINICAL DATA:  Abdominal pain.  Pelvic abscess drainage today. EXAM: ABDOMEN - 1 VIEW COMPARISON:  CT abdomen pelvis from same day. FINDINGS: The bowel gas pattern is normal. No intraperitoneal free air peer no radio-opaque calculi or other significant radiographic abnormality are seen. No acute osseous abnormality. Prior lumbar fusion. IMPRESSION: 1. Negative. Electronically Signed   By: Titus Dubin M.D.   On: 04/14/2020 14:37   CT ABDOMEN PELVIS W CONTRAST  Result Date: 04/14/2020 CLINICAL DATA:  Peritonitis or perforation suspected Patient with history of perforated diverticulitis post percutaneous drain placement today. EXAM: CT ABDOMEN AND PELVIS WITH CONTRAST TECHNIQUE: Multidetector CT imaging of the abdomen and pelvis was performed using the standard protocol following bolus administration of intravenous contrast. CONTRAST:  57mL OMNIPAQUE IOHEXOL 300 MG/ML  SOLN COMPARISON:  Two prior abdominopelvic CT earlier this day, prior to drain placement. FINDINGS: Lower chest: Small pleural effusions are new from prior exam. Adjacent compressive atelectasis. Breathing motion artifact in the lung bases limits assessment. Hepatobiliary: Small amount of perihepatic fluid slightly increased from prior exam. This appears mildly complex. High-density contents in the urinary bladder increasing likely vicarious excretion of IV contrast. There is no discrete focal hepatic abnormality allowing for motion. Pancreas: Motion artifact through the pancreas. No obvious inflammation. Spleen: Normal allowing for motion.  Adrenals/Urinary Tract: No adrenal nodule. There is heterogeneous enhancement of both kidneys. No significant perinephric edema. Minimal excretion from both kidneys on delayed phase imaging. There is excreted IV contrast within the urinary bladder. Stomach/Bowel: New surgical drain within the pelvic fluid collection. No definite residual fluid. A drain is closely approximated to fecalized loops of small bowel in the pelvis. Motion artifact limits detailed bowel assessment. Similar appearance of scattered small bowel thickening from prior exam which is likely reactive. There is no evidence of obstruction. No bowel pneumatosis. Diverticulosis involving the colon. No new colonic inflammation or diverticulitis. Air-fluid level in the stomach with small amount of fluid in the distal esophagus. Vascular/Lymphatic: Aortic atherosclerosis. No aortic aneurysm. No evidence of portal venous or mesenteric gas. No bulky adenopathy. Reproductive: Uterus and bilateral adnexa are unremarkable. Other: Surgical drain within the pelvic fluid collection which appears completely decompressed. No definite residual collection. Generalized fat stranding involving the lower small bowel mesentery. Small amount of non organized free fluid in the dependent pelvis, tracking into the pericolic gutters and right upper quadrant. There is no definite free air. Musculoskeletal: Stable. Postsurgical change in the lower lumbar spine. IMPRESSION: 1. Interval percutaneous drainage of pelvic abscess. No residual fluid collection. 2. Small amount of non organized free fluid in the pelvis, pericolic gutters, and perihepatic right upper quadrant, slight increase from pre drainage CT earlier today. No evidence of new abscess. No free air or interval perforation. 3. Heterogeneous enhancement of both kidneys, nonspecific but can be seen with pyelonephritis. 4. Small pleural effusions are new from prior exam. Adjacent compressive atelectasis. Electronically  Signed   By: Keith Rake M.D.   On: 04/14/2020 22:22   CT Abdomen Pelvis W Contrast  Result Date: 04/14/2020 CLINICAL DATA:  Lower abdominal pain EXAM: CT ABDOMEN AND PELVIS WITH CONTRAST TECHNIQUE: Multidetector CT imaging of the abdomen and pelvis was performed using the standard protocol  following bolus administration of intravenous contrast. CONTRAST:  139mL OMNIPAQUE IOHEXOL 300 MG/ML  SOLN COMPARISON:  01/31/2019 FINDINGS: Lower chest: Lung bases demonstrate no acute consolidation or pleural effusion. Normal cardiac size. Hepatobiliary: No focal liver abnormality is seen. No gallstones, gallbladder wall thickening, or biliary dilatation. Pancreas: Unremarkable. No pancreatic ductal dilatation or surrounding inflammatory changes. Spleen: Normal in size without focal abnormality. Adrenals/Urinary Tract: Adrenal glands are unremarkable. Kidneys are normal, without renal calculi, focal lesion, or hydronephrosis. Bladder is unremarkable. Stomach/Bowel: The stomach is nonenlarged. No dilated small bowel. Mildly thickened loops of small bowel within the pelvis, likely reactive. Negative appendix. Sigmoid colon diverticular disease. Wall thickening and mild inflammatory change at the distal sigmoid colon consistent with diverticulitis. 7.5 x 4.4 cm gas and fluid collection within the anterior pelvis suspicious for contained perforation/abscess. Soft tissue inflammatory density extends from inflamed sigmoid colon to the gas and fluid collection, sagittal series 6, image number 96. Vascular/Lymphatic: Moderate aortic atherosclerosis. No aneurysm. No suspicious nodes. Reproductive: Uterus and bilateral adnexa are unremarkable. Other: No free air. Small free fluid in the pelvis. Small amount of upper abdominal ascites. Generalized soft tissue stranding within the pelvis consistent with inflammatory process. Musculoskeletal: Postsurgical changes of the lumbar spine with surgical rod and fixating screws L3 through  L5. No acute osseous abnormality IMPRESSION: 1. Findings consistent with acute sigmoid colon diverticulitis. 7.5 x 4.4 cm gas and fluid collection within the anterior pelvis suspicious for contained perforation/abscess, this appears to communicate with the thickened inflamed sigmoid colon. 2. Small amount of upper abdominal and pelvic ascites. 3. Thickened appearance of pelvic small bowel loops, likely reactive Aortic Atherosclerosis (ICD10-I70.0). Electronically Signed   By: Donavan Foil M.D.   On: 04/14/2020 01:27   DG Abd Portable 2V  Result Date: 04/15/2020 CLINICAL DATA:  Diverticulitis.  Abdominal pain and distention. EXAM: PORTABLE ABDOMEN - 2 VIEW COMPARISON:  CT 04/14/2020. FINDINGS: Pelvic drainage catheter again noted. No bowel distention. Stool noted throughout the colon. No free air. Bibasilar atelectasis. Degenerative change lumbar spine and both hips. Contrast in the bladder from prior CT. IMPRESSION: 1.  Pelvic drainage catheter in stable position. 2. No acute abdominal abnormality identified. No bowel distention or free air. 3.  Bibasilar atelectasis. Electronically Signed   By: Marcello Moores  Register   On: 04/15/2020 08:07   CT IMAGE GUIDED DRAINAGE PERCUT CATH  PERITONEAL RETROPERIT  Result Date: 04/14/2020 INDICATION: 59 year old with air-fluid collection in the lower abdomen / upper pelvic region. Findings are suggestive for a colonic diverticular abscess. Patient needs CT-guided drainage. EXAM: CT-GUIDED DRAINAGE OF PELVIC ABSCESS MEDICATIONS: Fentanyl 25 mcg ANESTHESIA/SEDATION: The patient was continuously monitored during the procedure by the interventional radiology nurse under my direct supervision. COMPLICATIONS: None immediate. PROCEDURE: Informed written consent was obtained from the patient after a thorough discussion of the procedural risks, benefits and alternatives. All questions were addressed. A timeout was performed prior to the initiation of the procedure. Patient was placed  supine on the CT scanner. Images through the lower abdomen and pelvis were performed. Air-fluid collection in the lower abdomen/pelvic region was identified and targeted for drainage. The anterior abdomen was prepped with chlorhexidine and sterile field was created. Skin and soft tissues were anesthetized using 1% lidocaine. A small incision was made. Using CT guidance, a 18 gauge trocar needle was directed into the air-fluid collection. Gray purulent fluid was coming out of the needle. Super stiff Amplatz wire was advanced into the collection and follow up CT images were obtained. The drain  was dilated to accommodate a 12 [place] multipurpose drain. 12 French drain was placed. 40 mL gray colored purulent fluid and gas was removed from the collection. Follow up CT images were obtained. Catheter was sutured to skin and attached to a suction bulb. FINDINGS: Air-fluid collection just cephalad to the urinary bladder. Drain was successfully placed within the collection and 40 mL of purulent fluid was removed. The abscess was decompressed at the end of the procedure. IMPRESSION: CT-guided placement of a drainage catheter within the lower abdomen/pelvic abscess. Electronically Signed   By: Markus Daft M.D.   On: 04/14/2020 11:48       Assessment/Plan:  COPD exacerbation with severe sepsis with ABD abscess and shock with liver and renal failure Continue IV steroids and BD therapy Patient is DNI  Severe Sepsis Responds to IVF's Continue JP drain Follow up Gen surgery Recs  ELECTROLYTES -follow labs as needed -replace as needed -pharmacy consultation and following  Afib with RVR Consider AMIO  DVT/GI PRX ordered and assessed TRANSFUSIONS AS NEEDED MONITOR FSBS I Assessed the need for Labs I Assessed the need for Foley I Assessed the need for Central Venous Line Family Discussion when available I Assessed the need for Mobilization I made an Assessment of medications to be adjusted  accordingly Safety Risk assessment completed  CASE DISCUSSED IN MULTIDISCIPLINARY ROUNDS WITH ICU TEAM     Lydiann Bonifas Patricia Pesa, M.D.  [facility]  Medical Director [place] Director [place] Department

## 2020-04-15 NOTE — Progress Notes (Signed)
[facility] SURGICAL PROGRESS NOTE (cpt [phone])  Hospital Day(s): 1.   Interval History:  Patient seen and examined Overnight continued to to have issues with tachypnea, tachycardia, and worsening lactic acidosis (6.9 --> 6.1 --> 7.2 --> 10.9). Dr Mortimer Fries discussed code status and patient elected for partial code and DNI. She underwent repeat CT Abdomen/Pelvis as well which was negative for pneumoperitoneum/pneumotosis.  This morning, patient appears somnolent but endorses that she is feeling better. She reports her abdominal pain and her breathing have improved despite still being audibly wheezy. Remaining labs this morning are still pending. UO - 400 ccs. Her BP has been soft but has been able to remain off vasopressors. JP drain remains seropurulent.   Review of Systems:  Constitutional: denies fever, chills  HEENT: denies cough or congestion  Respiratory: + shortness of breath  Cardiovascular: denies chest pain or palpitations  Gastrointestinal: + abdominal pain, denied N/V, or diarrhea/and bowel function as per interval history Genitourinary: denies burning with urination or urinary frequency Musculoskeletal: denies pain, decreased motor or sensation   Vital signs in last 24 hours: [min-max] current  Temp:  [97.5 F (36.4 C)-98.9 F (37.2 C)] 98.9 F (37.2 C) (08/17 0500) Pulse Rate:  [33-132] 100 (08/17 0700) Resp:  [14-35] 14 (08/17 0700) BP: (73-128)/(25-94) 75/55 (08/17 0700) SpO2:  [91 %-100 %] 98 % (08/17 0700) FiO2 (%):  [32 %] 32 % (08/16 2037) Weight:  [121.6 kg] 121.6 kg (08/16 1822)     Height: 5\' 6"  (167.6 cm) Weight: 121.6 kg BMI (Calculated): 43.29   Intake/Output last 2 shifts:  08/16 0701 - 08/17 0700 In: 8250 [P.O.:240; IV Piggyback:3250] Out: 400 [Urine:400]   Physical Exam:  Constitutional: alert, resting in bed, appears somnolent but arouses to verbal stimuli  HENT: normocephalic without obvious abnormality  Respiratory: On [place], audible  inspiratory and expiratory wheezes, less tachypneic this morning Cardiovascular: Borderline tachycardic and sinus rhythm  Gastrointestinal: Abdomen is obese, appears softer this morning, she is tender but certainly without any evidence of peritonitis. JP in LLQ with seropurulent drainage   Musculoskeletal: + 1 pitting edema to lower extremities    Labs:  CBC Latest Ref Rng & Units 04/14/2020 04/14/2020 04/13/2020  WBC 4.0 - 10.5 K/uL 7.4 4.7 7.8  Hemoglobin 12.0 - 15.0 g/dL 13.7 14.7 13.4  Hematocrit 36 - 46 % 41.7 42.7 40.4  Platelets 150 - 400 K/uL 294 296 354   CMP Latest Ref Rng & Units 04/14/2020 04/14/2020 04/13/2020  Glucose 70 - 99 mg/dL 108(H) 153(H) 125(H)  BUN 6 - 20 mg/dL 32(H) 30(H) 25(H)  Creatinine 0.44 - 1.00 mg/dL 1.78(H) 1.28(H) 0.80  Sodium 135 - 145 mmol/L 136 133(L) 135  Potassium 3.5 - 5.1 mmol/L 4.3 4.5 3.8  Chloride 98 - 111 mmol/L 104 100 101  CO2 22 - 32 mmol/L 17(L) 18(L) 23  Calcium 8.9 - 10.3 mg/dL 7.9(L) 8.4(L) 8.6(L)  Total Protein 6.5 - 8.1 g/dL 5.4(L) - 6.4(L)  Total Bilirubin 0.3 - 1.2 mg/dL 0.5 - 0.6  Alkaline Phos 38 - 126 U/L 73 - 89  AST 15 - 41 U/L 35 - 25  ALT 0 - 44 U/L 38 - 31     Imaging studies:   CT Abdomen/Pelvis (04/14/2020 - 2200) personally reviewed without evidence of pneumoperitoneum or peritonitis, and radiologist report reviewed:  IMPRESSION: 1. Interval percutaneous drainage of pelvic abscess. No residual fluid collection. 2. Small amount of non organized free fluid in the pelvis, pericolic gutters, and perihepatic right upper  quadrant, slight increase from pre drainage CT earlier today. No evidence of new abscess. No free air or interval perforation. 3. Heterogeneous enhancement of both kidneys, nonspecific but can be seen with pyelonephritis. 4. Small pleural effusions are new from prior exam. Adjacent compressive atelectasis.   Assessment/Plan: (ICD-10's:K57.20) 59 y.o. female with worsening lactic acidosis, which may  be attributable to patient's HIV medication Jorje Guild), as she is without any evidence of peritonitis/pneumoperitoneum/pneumotosis on examination or imaging, admitted with complicated sigmoid diverticulitis with abscess s/p percutaneous drainage on 17/49, complicated by pertinent comorbidities including marked COPD.   - Appreciate PCCM assistance with this patient  - Again, she is without any evidence of peritonitis/pneumoperitoneum and from an abdominal standpoint clinically looks improved. No emergent surgical intervention. We will closely follow  - Recommend remain NPO; can consider NGT if develops nausea/emesis  - Monitor abdominal examination; leukocytosis; lactic acidosis, fever curve  - Will place follow to monitor UO; renal function  - Continue IV Abx   - Pain control prn; antiemetics prn  - Maintain JP drain; record output  - Further management per primary service   All of the above findings and recommendations were discussed with the patient, and the medical team, and all of patient's questions were answered to her expressed satisfaction.  -- Edison Simon, PA-C [facility] 04/15/2020, 7:22 AM [phone] M-F: 7am - 4pm

## 2020-04-15 NOTE — Consult Note (Signed)
NAME: Melanie Bowen  DOB: 1961/04/27  MRN: [id number]  Date/Time: 04/15/2020 11:50 AM  REQUESTING PROVIDER: Dr. Manuella Ghazi Subjective:  REASON FOR CONSULT: HIV ?No history available from patient.  Chart reviewed. Melanie Bowen is a 59 y.o. female with a history of HIV disease, COPD, hypertension Presented to the ED on 04/13/2020 with abdominal pain Patient had initially presented to the ED on 04/04/2020 with abdominal pain.  There was a question of diverticulitis versus possible UTI.  She had refused CT imaging and wanted to go home on antibiotics.  Was given Augmentin and was discharged from the ED. in the ED on 04/13/2020 vitals were temperature of 97.5, blood pressure of 141/98, heart rate of 93.  CT scan showed findings consistent with acute sigmoid colon diverticulitis.  There was a 7.5 cm into 4.4 cm gas and fluid collection within the anterior pelvis suspicious for contained perforation/abscess. Initial initial WBC was 7.8, creatinine 0.80.  blood cultures were sent and she was started on Zosyn.  On 04/14/2020 IR placed a drain. Patient started to decompensate with hypotension, midnight of 04/15/2020.  Lactate started to increase and procalcitonin was increased <0.1 to 105. AST and ALT increased to 1000 634 and 853 from normal.Creatinine went up to 3.23.  Patient is currently in ICU and and had received pressors. I am asked to see the patient as there was concern whether the lactate was increasing because of HIV medication Genvoya.  Past Medical History:  Diagnosis Date  . Acute GI hemorrhage 12/2015   required transfusion  . Allergic rhinitis   . Arthritis    DDD  . Asthma   . Blepharitis   . Bronchitis   . Cigarette smoker   . Claustrophobia   . COPD (chronic obstructive pulmonary disease) ([place])   . Cough   . DDD (degenerative disc disease), lumbar 04/10/2014  . Depression   . Diverticulosis   . Ectopic pregnancy   . Eye irritation   . Genital herpes   . GERD (gastroesophageal  reflux disease)   . Headache   . HIV (human immunodeficiency virus infection) ([place])   . Hypertension   . Paresthesia of hand, bilateral   . Shortness of breath dyspnea    at times due to asthma    Past Surgical History:  Procedure Laterality Date  . BACK SURGERY  12/2014   lumbar lam. Dr. Deri Fuelling  . BACK SURGERY  12/25/2012   Removal lumbosubarachnoid shunt system, L5-S1 TF ESI, Dr. Virl Axe Minchew  . back surgery may 2016     lumbar   . BUNIONECTOMY Bilateral    feet  . COLONOSCOPY  12/06/2014   rescheduled from 11/01/2014 due to poor prep  . DIAGNOSTIC LAPAROSCOPY    . DISTAL FEMUR OSTEOTOMY Right    Dr. Earnestine Leys  . ESOPHAGOGASTRODUODENOSCOPY Left 01/11/2016   Procedure: ESOPHAGOGASTRODUODENOSCOPY (EGD);  Surgeon: Hulen Luster, MD;  Location[facility] ENDOSCOPY;  Service: Endoscopy;  Laterality: Left;  . Finger fracture Right    5th finger  . FRACTURE SURGERY Right 01/25/2014   closed reduction & percutaneous pinning of 5th digit  . HAND SURGERY Bilateral    carpal tunnel  . JOINT REPLACEMENT Bilateral    knees  . KNEE SURGERY Bilateral 2003,2007   total Joint  . LAPAROSCOPY FOR ECTOPIC PREGNANCY    . PERIPHERAL VASCULAR CATHETERIZATION N/A 01/12/2016   Procedure: Visceral Angiography;  Surgeon: Algernon Huxley, MD;  Location: [place] CV LAB;  Service: Cardiovascular;  Laterality: N/A;  .  PERIPHERAL VASCULAR CATHETERIZATION N/A 01/12/2016   Procedure: Visceral Artery Intervention;  Surgeon: Algernon Huxley, MD;  Location: [place] [facility];  Service: Cardiovascular;  Laterality: N/A;  . REVISION TOTAL KNEE ARTHROPLASTY Right 12/31/2002   Dr. Marry Guan  . TUBAL LIGATION    . VIDEO BRONCHOSCOPY N/A 06/30/2015   Procedure: VIDEO BRONCHOSCOPY WITHOUT FLUORO;  Surgeon: Vilinda Boehringer, MD;  Location: [facility] ORS;  Service: Cardiopulmonary;  Laterality: N/A;    Social History   Socioeconomic History  . Marital status: Single    Spouse name: Not on file  . Number of children:  Not on file  . Years of education: Not on file  . Highest education level: Not on file  Occupational History  . Not on file  Tobacco Use  . Smoking status: Current Some Day Smoker    Packs/day: 0.25    Years: 38.00    Pack years: 9.50    Types: Cigarettes  . Smokeless tobacco: Never Used  . Tobacco comment: 1 cig daily  Vaping Use  . Vaping Use: Never used  Substance and Sexual Activity  . Alcohol use: Yes    Alcohol/week: 5.0 standard drinks    Types: 5 Glasses of wine per week    Comment: occ  . Drug use: Not Currently  . Sexual activity: Not on file  Other Topics Concern  . Not on file  Social History Narrative  . Not on file   Social Determinants of Health   Financial Resource Strain:   . Difficulty of Paying Living Expenses:   Food Insecurity:   . Worried About [name] fundraiser in the Last Year:   . Arboriculturist in the Last Year:   Transportation Needs:   . Film/video editor (Medical):   Marland Kitchen Lack of Transportation (Non-Medical):   Physical Activity:   . Days of Exercise per Week:   . Minutes of Exercise per Session:   Stress:   . Feeling of Stress :   Social Connections:   . Frequency of Communication with Friends and Family:   . Frequency of Social Gatherings with Friends and Family:   . Attends Religious Services:   . Active Member of Clubs or Organizations:   . Attends Archivist Meetings:   Marland Kitchen Marital Status:   Intimate Partner Violence:   . Fear of Current or Ex-Partner:   . Emotionally Abused:   Marland Kitchen Physically Abused:   . Sexually Abused:     Family History  Problem Relation Age of Onset  . Breast cancer Sister    Allergies  Allergen Reactions  . Acetaminophen Nausea And Vomiting  . Ibuprofen Other (See Comments)    Reports causes bleeding  . Gabapentin Rash  . Morphine Itching and Rash  . Morphine And Related Itching  . Zanaflex  [Tizanidine] Rash    ? Current Facility-Administered Medications  Medication Dose Route  Frequency Provider Last Rate Last Admin  . 0.9 %  sodium chloride infusion   Intravenous Continuous Tylene Fantasia, PA-C   Paused at 04/15/20 1123  . 0.9 %  sodium chloride infusion  250 mL Intravenous Continuous Darel Hong D, NP 10 mL/hr at 04/15/20 0543 250 mL at 04/15/20 0543  . acetaminophen (TYLENOL) tablet 650 mg  650 mg Oral Q6H PRN Mansy, Jan A, MD   650 mg at 04/14/20 0451   Or  . acetaminophen (TYLENOL) suppository 650 mg  650 mg Rectal Q6H PRN Mansy, Arvella Merles, MD      .  budesonide (PULMICORT) nebulizer solution 0.5 mg  0.5 mg Nebulization BID Flora Lipps, MD   0.5 mg at 04/15/20 0814  . cefTRIAXone (ROCEPHIN) 2 g in sodium chloride 0.9 % 100 mL IVPB  2 g Intravenous Q24H Max Sane, MD 200 mL/hr at 04/15/20 1144 Rate Verify at 04/15/20 1144  . Chlorhexidine Gluconate Cloth 2 % PADS 6 each  6 each Topical Daily Max Sane, MD   6 each at 04/14/20 1832  . enoxaparin (LOVENOX) injection 40 mg  40 mg Subcutaneous Q24H Mansy, Jan A, MD   40 mg at 04/15/20 0230  . famotidine (PEPCID) tablet 20 mg  20 mg Oral Daily Mansy, Jan A, MD   20 mg at 04/14/20 2125  . HYDROcodone-acetaminophen (NORCO/VICODIN) 5-325 MG per tablet 1-2 tablet  1-2 tablet Oral Q6H PRN Darel Hong D, NP      . HYDROmorphone (DILAUDID) injection 0.5-1 mg  0.5-1 mg Intravenous Q2H PRN Mansy, Jan A, MD   1 mg at 04/15/20 1116  . ipratropium-albuterol (DUONEB) 0.5-2.5 (3) MG/3ML nebulizer solution 3 mL  3 mL Nebulization Q4H Flora Lipps, MD   3 mL at 04/15/20 1147  . ipratropium-albuterol (DUONEB) 0.5-2.5 (3) MG/3ML nebulizer solution 3 mL  3 mL Nebulization Q4H PRN Darel Hong D, NP      . labetalol (NORMODYNE) injection 20 mg  20 mg Intravenous Q3H PRN Mansy, Jan A, MD      . magnesium hydroxide (MILK OF MAGNESIA) suspension 30 mL  30 mL Oral Daily PRN Mansy, Jan A, MD      . methylPREDNISolone sodium succinate (SOLU-MEDROL) 40 mg/mL injection 40 mg  40 mg Intravenous Q6H Darel Hong D, NP   40 mg at  04/15/20 1116  . metroNIDAZOLE (FLAGYL) IVPB 500 mg  500 mg Intravenous Q8H Max Sane, MD   Stopped at 04/15/20 0448  . mometasone-formoterol (DULERA) 200-5 MCG/ACT inhaler 2 puff  2 puff Inhalation BID Mansy, Jan A, MD   2 puff at 04/14/20 2210  . norepinephrine (LEVOPHED) 4mg  in 235mL premix infusion  2-10 mcg/min Intravenous Titrated Darel Hong D, NP      . pantoprazole (PROTONIX) injection 40 mg  40 mg Intravenous Daily Max Sane, MD   40 mg at 04/15/20 0927  . sodium chloride flush (NS) 0.9 % injection 5 mL  5 mL Intracatheter Q8H Markus Daft, MD   5 mL at 04/15/20 0546  . valACYclovir (VALTREX) tablet 1,000 mg  1,000 mg Oral QHS Mansy, Jan A, MD   1,000 mg at 04/14/20 2120     Abtx:  Anti-infectives (From admission, onward)   Start     Dose/Rate Route Frequency Ordered Stop   04/14/20 2200  elvitegravir-cobicistat-emtricitabine-tenofovir (GENVOYA) 150-150-200-10 MG tablet 1 tablet  Status:  Discontinued        1 tablet Oral Daily at bedtime 04/14/20 0235 04/15/20 0957   04/14/20 2200  valACYclovir (VALTREX) tablet 1,000 mg     Discontinue     1,000 mg Oral Daily at bedtime 04/14/20 0235     04/14/20 1230  cefTRIAXone (ROCEPHIN) 2 g in sodium chloride 0.9 % 100 mL IVPB     Discontinue     2 g 200 mL/hr over 30 Minutes Intravenous Every 24 hours 04/14/20 1223     04/14/20 1230  metroNIDAZOLE (FLAGYL) IVPB 500 mg     Discontinue     500 mg 100 mL/hr over 60 Minutes Intravenous Every 8 hours 04/14/20 1223     04/14/20 1030  piperacillin-tazobactam (  ZOSYN) IVPB 3.375 g  Status:  Discontinued        3.375 g 12.5 mL/hr over 240 Minutes Intravenous Every 8 hours 04/14/20 0235 04/14/20 1223   04/14/20 0215  piperacillin-tazobactam (ZOSYN) IVPB 4.5 g  Status:  Discontinued        4.5 g 200 mL/hr over 30 Minutes Intravenous  Once 04/14/20 0200 04/14/20 0208   04/14/20 0215  piperacillin-tazobactam (ZOSYN) IVPB 3.375 g        3.375 g 100 mL/hr over 30 Minutes Intravenous  Once  04/14/20 0208 04/14/20 0301      REVIEW OF SYSTEMS:  NA?  Objective:  VITALS:  BP 99/85   Pulse 99   Temp 99 F (37.2 C) (Axillary)   Resp (!) 22   Ht 5\' 6"  (1.676 m)   Wt 121.6 kg   SpO2 96%   BMI 43.27 kg/m  PHYSICAL EXAM:  General: Sedated, somnolent, Head: Normocephalic, without obvious abnormality, atraumatic. Eyes: Conjunctivae clear, anicteric sclerae. Pupils are equal, false eyelashes ENT cannot examine Oral cavity cannot be examined Neck: Supple, . Back: No CVA tenderness. Lungs: Bilateral rhonchi. Heart: Tachycardia Abdomen: JP drain in place Extremities: atraumatic, no cyanosis. No edema. No clubbing Skin: No rashes or lesions. Or bruising Lymph: Cervical, supraclavicular normal. Neurologic: Cannot be examined Pertinent Labs Lab Results CBC    Component Value Date/Time   WBC 17.2 (H) 04/15/2020 0609   RBC 3.87 04/15/2020 0609   HGB 11.9 (L) 04/15/2020 0609   HGB 11.2 11/06/2019 1514   HCT 36.8 04/15/2020 0609   HCT 35.4 11/06/2019 1514   PLT 149 (L) 04/15/2020 0609   PLT 556 (H) 11/06/2019 1514   MCV 95.1 04/15/2020 0609   MCV 87 11/06/2019 1514   MCV 100 12/21/2014 0710   MCH 30.7 04/15/2020 0609   MCHC 32.3 04/15/2020 0609   RDW 20.3 (H) 04/15/2020 0609   RDW 14.9 11/06/2019 1514   RDW 14.0 12/21/2014 0710   LYMPHSABS 0.8 04/14/2020 1245   LYMPHSABS 0.7 11/06/2019 1514   LYMPHSABS 2.3 12/14/2014 2023   MONOABS 0.4 04/14/2020 1245   MONOABS 0.8 12/14/2014 2023   EOSABS 0.0 04/14/2020 1245   EOSABS 0.0 11/06/2019 1514   EOSABS 0.7 12/14/2014 2023   BASOSABS 0.1 04/14/2020 1245   BASOSABS 0.0 11/06/2019 1514   BASOSABS 0.1 12/14/2014 2023    CMP Latest Ref Rng & Units 04/15/2020 04/14/2020 04/14/2020  Glucose 70 - 99 mg/dL 110(H) 108(H) 153(H)  BUN 6 - 20 mg/dL 44(H) 32(H) 30(H)  Creatinine 0.44 - 1.00 mg/dL 3.23(H) 1.78(H) 1.28(H)  Sodium 135 - 145 mmol/L 135 136 133(L)  Potassium 3.5 - 5.1 mmol/L 5.2(H) 4.3 4.5  Chloride 98 - 111  mmol/L 104 104 100  CO2 22 - 32 mmol/L 12(L) 17(L) 18(L)  Calcium 8.9 - 10.3 mg/dL 7.0(L) 7.9(L) 8.4(L)  Total Protein 6.5 - 8.1 g/dL 4.4(L) 5.4(L) -  Total Bilirubin 0.3 - 1.2 mg/dL 0.6 0.5 -  Alkaline Phos 38 - 126 U/L 60 73 -  AST 15 - 41 U/L 1,634(H) 35 -  ALT 0 - 44 U/L 853(H) 38 -      Microbiology: Recent Results (from the past 240 hour(s))  SARS Coronavirus 2 by RT PCR (hospital order, performed in [facility] hospital lab) Nasopharyngeal Nasopharyngeal Swab     Status: None   Collection Time: 04/14/20  2:17 AM   Specimen: Nasopharyngeal Swab  Result Value Ref Range Status   SARS Coronavirus 2 NEGATIVE NEGATIVE Final  Comment: (NOTE) SARS-CoV-2 target nucleic acids are NOT DETECTED.  The SARS-CoV-2 RNA is generally detectable in upper and lower respiratory specimens during the acute phase of infection. The lowest concentration of SARS-CoV-2 viral copies this assay can detect is 250 copies / mL. A negative result does not preclude SARS-CoV-2 infection and should not be used as the sole basis for treatment or other patient management decisions.  A negative result may occur with improper specimen collection / handling, submission of specimen other than nasopharyngeal swab, presence of viral mutation(s) within the areas targeted by this assay, and inadequate number of viral copies (<250 copies / mL). A negative result must be combined with clinical observations, patient history, and epidemiological information.  Fact Sheet for Patients:   StrictlyIdeas.no  Fact Sheet for Healthcare Providers: BankingDealers.co.za  This test is not yet approved or  cleared by the [place] FDA and has been authorized for detection and/or diagnosis of SARS-CoV-2 by FDA under an Emergency Use Authorization (EUA).  This EUA will remain in effect (meaning this test can be used) for the duration of the COVID-19 declaration under Section  564(b)(1) of the Act, 21 U.S.C. section 360bbb-3(b)(1), unless the authorization is terminated or revoked sooner.  Performed at [facility], [address] [place], [place] [address]   CULTURE, BLOOD (ROUTINE X 2) w Reflex to ID Panel     Status: None (Preliminary result)   Collection Time: 04/14/20  8:14 AM   Specimen: BLOOD  Result Value Ref Range Status   Specimen Description BLOOD LEFT AC  Final   Special Requests   Final    BOTTLES DRAWN AEROBIC AND ANAEROBIC Blood Culture adequate volume   Culture   Final    NO GROWTH < 24 HOURS Performed at [facility], [address] [place], [place] [address]    Report Status PENDING  Incomplete  CULTURE, BLOOD (ROUTINE X 2) w Reflex to ID Panel     Status: None (Preliminary result)   Collection Time: 04/14/20  8:14 AM   Specimen: BLOOD  Result Value Ref Range Status   Specimen Description BLOOD LEFT WRIST  Final   Special Requests   Final    BOTTLES DRAWN AEROBIC AND ANAEROBIC Blood Culture adequate volume   Culture   Final    NO GROWTH < 24 HOURS Performed at [facility], [address], [place], [place] [address]    Report Status PENDING  Incomplete  Aerobic/Anaerobic Culture (surgical/deep wound)     Status: None (Preliminary result)   Collection Time: 04/14/20 11:14 AM   Specimen: Abscess  Result Value Ref Range Status   Specimen Description   Final    ABSCESS Performed at [facility], [address], [place], [place] [address]    Special Requests   Final    NONE Performed at [facility], [address] [place], [place] [address]    Gram Stain   Final    NO WBC SEEN ABUNDANT GRAM NEGATIVE RODS FEW GRAM POSITIVE COCCI FEW GRAM POSITIVE RODS    Culture   Final    CULTURE REINCUBATED FOR BETTER GROWTH Performed at [facility], [place] [address], [place],  [address]    Report Status PENDING  Incomplete  MRSA PCR Screening     Status: None   Collection  Time: 04/14/20  6:19 PM   Specimen: Nasopharyngeal  Result Value Ref Range Status   MRSA by PCR NEGATIVE NEGATIVE Final  Comment:        The GeneXpert MRSA Assay (FDA approved for NASAL specimens only), is one component of a comprehensive MRSA colonization surveillance program. It is not intended to diagnose MRSA infection nor to guide or monitor treatment for MRSA infections. Performed at [facility], [address], [place], [place] [address]     IMAGING RESULTS: CT abdomen reviewed.  I have personally reviewed the films ? Impression/Recommendation ? ?Septic shock secondary to perforated diverticulitis.  Worsening after the insertion of JP drain.  With increasing lactic acidosis.  We need to repeat her CT abdomen and pelvis to make sure there is no perforation of the colon.  Did have a repeat CT last night. Currently on Zosyn and Flagyl.  Can stop the Flagyl. We will change Zosyn to meropenem to cover ESBL as she has decompensated on Zosyn.  Await cultures. We will add anidulafungin for Candida coverage  HIV On Genvoya okay to hold it for now.  But I do not think that is causing the lactic acidosis.  As she is clearly septic. Last CD4 count is 265 with 33% on 04/14/2020.  COPD Patient is also on  Solu-Medrol every 6h ___________________________________________________ Discussed with care team. Note:  This document was prepared using Dragon voice recognition software and may include unintentional dictation errors.

## 2020-04-15 NOTE — Progress Notes (Signed)
Pt's lactic acid continues to worsen, now 7.2 (previously 6.1).  CT Abdomen/Pelvis obtained at 22:00 earlier in the shift, which was NEGATIVE for NEW ABSCESS, FREE AIR, or PERFORATION.  Abdominal exam remains unchanged, and pt's abdominal pain has been more well controlled with IV Dilaudid.  Pt's MAP has remained >65 up till 03:00.  Discussed with Dr. Emmit Alexanders of [facility], will give additional fluid bolus, Levophed ordered if needed, and continue to trend lactic acid.     Darel Hong, AGACNP-BC [place] [facility] Pager: [phone]

## 2020-04-15 NOTE — Consult Note (Signed)
Pharmacy Antibiotic Note  Melanie Bowen is a 59 y.o. female admitted on 04/14/2020 with intra-abdominal infection - decompensating on Zosyn.     Pharmacy has been consulted for Meropenem dosing.  Plan: Will dose Meropenem 1g q12h  Height: 5\' 6"  (167.6 cm) Weight: 121.6 kg (268 lb 1.3 oz) IBW/kg (Calculated) : 59.3  Temp (24hrs), Avg:98.7 F (37.1 C), Min:97.7 F (36.5 C), Max:99 F (37.2 C)  Recent Labs  Lab 04/13/20 2140 04/14/20 0648 04/14/20 0813 04/14/20 0813 04/14/20 1245 04/14/20 1421 04/14/20 2244 04/15/20 0200 04/15/20 0609 04/15/20 1337 04/15/20 1631  WBC 7.8 4.7 5.1  --  7.4  --   --   --  17.2*  --   --   CREATININE 0.80 1.28*  --   --  1.78*  --   --   --  3.23*  --   --   LATICACIDVEN  --   --  3.7*   < > 2.8*   < > 6.1* 7.2* 10.9* 6.1* 5.7*   < > = values in this interval not displayed.    Estimated Creatinine Clearance: 25.2 mL/min (A) (by C-G formula based on SCr of 3.23 mg/dL (H)).    Allergies  Allergen Reactions  . Acetaminophen Nausea And Vomiting  . Ibuprofen Other (See Comments)    Reports causes bleeding  . Gabapentin Rash  . Morphine Itching and Rash  . Morphine And Related Itching  . Zanaflex  [Tizanidine] Rash    Antimicrobials this admission: Valacyclovir 8/17 >>  Ceftriaxone/metronidazole 8/16>8/17  Meropenem 8/17 >> Anidulafungin 8/17 >> Zosyn 8.17 >>  Dose adjustments this admission: None  Microbiology results: Wound abscess 8/16 > Abundant GNR, few GPC, few GPR Blood Cx 8/16 pending UCx 8/17 pending  MRSA PCR NEG, COVID NEG  Thank you for allowing pharmacy to be a part of this patient's care.  Lu Duffel, PharmD, BCPS Clinical Pharmacist 04/15/2020 8:16 PM

## 2020-04-15 NOTE — Progress Notes (Signed)
Pt resting in bed quietly. Pt gets very restless and begins to moan when in pain, PRN dilaudid given 3 times today throughout shift. VSS at this time on 3L [place]. Pt has audible expiratory wheezes, pt is a DNI and refuses bipap. Pt is alert and oriented X3, disoriented to time. Pt's daughter, Carloyn Jaeger, visited today and another daughter, Edwin Dada, also visited after pretending to be [facility] to get to unit. Pt sustained HR of 160 this afternoon and amiodarone gtt was ordered but was never started. HR is now 100.

## 2020-04-16 ENCOUNTER — Inpatient Hospital Stay: Payer: Medicare Other

## 2020-04-16 DIAGNOSIS — N179 Acute kidney failure, unspecified: Secondary | ICD-10-CM | POA: Diagnosis present

## 2020-04-16 DIAGNOSIS — Z7189 Other specified counseling: Secondary | ICD-10-CM

## 2020-04-16 DIAGNOSIS — A419 Sepsis, unspecified organism: Secondary | ICD-10-CM | POA: Diagnosis not present

## 2020-04-16 DIAGNOSIS — R0603 Acute respiratory distress: Secondary | ICD-10-CM

## 2020-04-16 DIAGNOSIS — K5720 Diverticulitis of large intestine with perforation and abscess without bleeding: Secondary | ICD-10-CM | POA: Diagnosis not present

## 2020-04-16 DIAGNOSIS — K7290 Hepatic failure, unspecified without coma: Secondary | ICD-10-CM

## 2020-04-16 DIAGNOSIS — Z515 Encounter for palliative care: Secondary | ICD-10-CM

## 2020-04-16 DIAGNOSIS — K5790 Diverticulosis of intestine, part unspecified, without perforation or abscess without bleeding: Secondary | ICD-10-CM | POA: Diagnosis present

## 2020-04-16 DIAGNOSIS — R6521 Severe sepsis with septic shock: Secondary | ICD-10-CM | POA: Diagnosis not present

## 2020-04-16 DIAGNOSIS — I959 Hypotension, unspecified: Secondary | ICD-10-CM | POA: Diagnosis present

## 2020-04-16 LAB — CBC
HCT: 35.2 % — ABNORMAL LOW (ref 36.0–46.0)
Hemoglobin: 11.5 g/dL — ABNORMAL LOW (ref 12.0–15.0)
MCH: 30.3 pg (ref 26.0–34.0)
MCHC: 32.7 g/dL (ref 30.0–36.0)
MCV: 92.9 fL (ref 80.0–100.0)
Platelets: 94 10*3/uL — ABNORMAL LOW (ref 150–400)
RBC: 3.79 MIL/uL — ABNORMAL LOW (ref 3.87–5.11)
RDW: 20.2 % — ABNORMAL HIGH (ref 11.5–15.5)
WBC: 15.2 10*3/uL — ABNORMAL HIGH (ref 4.0–10.5)
nRBC: 1.6 % — ABNORMAL HIGH (ref 0.0–0.2)

## 2020-04-16 LAB — BASIC METABOLIC PANEL
Anion gap: 20 — ABNORMAL HIGH (ref 5–15)
BUN: 66 mg/dL — ABNORMAL HIGH (ref 6–20)
CO2: 14 mmol/L — ABNORMAL LOW (ref 22–32)
Calcium: 6.7 mg/dL — ABNORMAL LOW (ref 8.9–10.3)
Chloride: 101 mmol/L (ref 98–111)
Creatinine, Ser: 4.65 mg/dL — ABNORMAL HIGH (ref 0.44–1.00)
GFR calc Af Amer: 11 mL/min — ABNORMAL LOW (ref >60)
GFR calc non Af Amer: 10 mL/min — ABNORMAL LOW (ref >60)
Glucose, Bld: 172 mg/dL — ABNORMAL HIGH (ref 70–99)
Potassium: 4.9 mmol/L (ref 3.5–5.1)
Sodium: 135 mmol/L (ref 135–145)

## 2020-04-16 LAB — COMPREHENSIVE METABOLIC PANEL
ALT: 1556 U/L — ABNORMAL HIGH (ref 0–44)
AST: 2020 U/L — ABNORMAL HIGH (ref 15–41)
Albumin: 2.2 g/dL — ABNORMAL LOW (ref 3.5–5.0)
Alkaline Phosphatase: 84 U/L (ref 38–126)
Anion gap: 17 — ABNORMAL HIGH (ref 5–15)
BUN: 64 mg/dL — ABNORMAL HIGH (ref 6–20)
CO2: 16 mmol/L — ABNORMAL LOW (ref 22–32)
Calcium: 6.6 mg/dL — ABNORMAL LOW (ref 8.9–10.3)
Chloride: 102 mmol/L (ref 98–111)
Creatinine, Ser: 4.48 mg/dL — ABNORMAL HIGH (ref 0.44–1.00)
GFR calc Af Amer: 12 mL/min — ABNORMAL LOW (ref >60)
GFR calc non Af Amer: 10 mL/min — ABNORMAL LOW (ref >60)
Glucose, Bld: 145 mg/dL — ABNORMAL HIGH (ref 70–99)
Potassium: 5.5 mmol/L — ABNORMAL HIGH (ref 3.5–5.1)
Sodium: 135 mmol/L (ref 135–145)
Total Bilirubin: 0.8 mg/dL (ref 0.3–1.2)
Total Protein: 5.3 g/dL — ABNORMAL LOW (ref 6.5–8.1)

## 2020-04-16 LAB — HEPATITIS B SURFACE ANTIGEN: Hepatitis B Surface Ag: NONREACTIVE

## 2020-04-16 LAB — URINE CULTURE: Culture: NO GROWTH

## 2020-04-16 LAB — PROTIME-INR
INR: 2.1 — ABNORMAL HIGH (ref 0.8–1.2)
Prothrombin Time: 22.7 seconds — ABNORMAL HIGH (ref 11.4–15.2)

## 2020-04-16 LAB — PHOSPHORUS: Phosphorus: 8.4 mg/dL — ABNORMAL HIGH (ref 2.5–4.6)

## 2020-04-16 LAB — LACTIC ACID, PLASMA: Lactic Acid, Venous: 1.6 mmol/L (ref 0.5–1.9)

## 2020-04-16 LAB — PROCALCITONIN: Procalcitonin: >150 ng/mL

## 2020-04-16 LAB — MAGNESIUM: Magnesium: 1.9 mg/dL (ref 1.7–2.4)

## 2020-04-16 MED ORDER — CALCIUM GLUCONATE-NACL 1-0.675 GM/50ML-% IV SOLN
1.0000 g | Freq: Once | INTRAVENOUS | Status: AC
  Administered 2020-04-16: 1000 mg via INTRAVENOUS
  Filled 2020-04-16: qty 50

## 2020-04-16 MED ORDER — GUAIFENESIN-DM 100-10 MG/5ML PO SYRP
10.0000 mL | ORAL_SOLUTION | Freq: Three times a day (TID) | ORAL | Status: DC
  Administered 2020-04-19 – 2020-05-19 (×27): 10 mL via ORAL
  Filled 2020-04-16 (×47): qty 10

## 2020-04-16 MED ORDER — DEXTROSE 50 % IV SOLN
1.0000 | Freq: Once | INTRAVENOUS | Status: AC
  Administered 2020-04-16: 50 mL via INTRAVENOUS
  Filled 2020-04-16: qty 50

## 2020-04-16 MED ORDER — LEVALBUTEROL HCL 1.25 MG/0.5ML IN NEBU
1.2500 mg | INHALATION_SOLUTION | Freq: Three times a day (TID) | RESPIRATORY_TRACT | Status: DC
  Administered 2020-04-17 – 2020-04-19 (×7): 1.25 mg via RESPIRATORY_TRACT
  Filled 2020-04-16 (×7): qty 0.5

## 2020-04-16 MED ORDER — SODIUM CHLORIDE 0.9 % IV SOLN
100.0000 mg | INTRAVENOUS | Status: AC
  Administered 2020-04-16 – 2020-04-22 (×6): 100 mg via INTRAVENOUS
  Filled 2020-04-16 (×6): qty 100

## 2020-04-16 MED ORDER — DILTIAZEM HCL 25 MG/5ML IV SOLN: 10.0000 mg | Freq: Once | INTRAVENOUS | Status: AC

## 2020-04-16 MED ORDER — AMIODARONE HCL IN DEXTROSE 360-4.14 MG/200ML-% IV SOLN
INTRAVENOUS | Status: AC
  Administered 2020-04-16: 60 mg/h
  Filled 2020-04-16: qty 200

## 2020-04-16 MED ORDER — HEPARIN SOD (PORK) LOCK FLUSH 1 UNIT/ML IV SOLN
0.5000 mL | INTRAVENOUS | Status: DC | PRN
  Administered 2020-04-16: 1.4 mL via INTRAVENOUS
  Filled 2020-04-16 (×2): qty 2

## 2020-04-16 MED ORDER — AMIODARONE HCL IN DEXTROSE 360-4.14 MG/200ML-% IV SOLN
60.0000 mg/h | INTRAVENOUS | Status: AC
  Filled 2020-04-16: qty 200

## 2020-04-16 MED ORDER — DILTIAZEM HCL 25 MG/5ML IV SOLN
INTRAVENOUS | Status: AC
  Administered 2020-04-16: 10 mg via INTRAVENOUS
  Filled 2020-04-16: qty 5

## 2020-04-16 MED ORDER — FUROSEMIDE 10 MG/ML IJ SOLN
40.0000 mg | Freq: Once | INTRAMUSCULAR | Status: AC
  Administered 2020-04-16: 40 mg via INTRAVENOUS
  Filled 2020-04-16: qty 4

## 2020-04-16 MED ORDER — AMIODARONE HCL IN DEXTROSE 360-4.14 MG/200ML-% IV SOLN
60.0000 mg/h | INTRAVENOUS | Status: DC
  Administered 2020-04-16: 30 mg/h via INTRAVENOUS
  Administered 2020-04-17 – 2020-04-21 (×17): 60 mg/h via INTRAVENOUS
  Filled 2020-04-16 (×18): qty 200

## 2020-04-16 MED ORDER — AMIODARONE LOAD VIA INFUSION
150.0000 mg | Freq: Once | INTRAVENOUS | Status: AC
  Administered 2020-04-16: 150 mg via INTRAVENOUS

## 2020-04-16 MED ORDER — INSULIN ASPART 100 UNIT/ML IV SOLN
10.0000 [IU] | Freq: Once | INTRAVENOUS | Status: AC
  Administered 2020-04-16: 10 [IU] via INTRAVENOUS
  Filled 2020-04-16: qty 0.1

## 2020-04-16 NOTE — Procedures (Signed)
Central Venous Catheter Insertion Procedure Note  Melanie Bowen  [id number]  06-May-1961  Date:04/16/20  Time:1:27 PM   Provider Performing:Maryruth Apple D Dewaine Conger   Procedure: Insertion of Non-tunneled Central Venous Catheter(36556)with US guidance (02637)    Indication(s) Hemodialysis  Consent Risks of the procedure as well as the alternatives and risks of each were explained to the patient and/or caregiver.  Consent for the procedure was obtained and is signed in the bedside chart  Anesthesia Topical only with 1% lidocaine   Timeout Verified patient identification, verified procedure, site/side was marked, verified correct patient position, special equipment/implants available, medications/allergies/relevant history reviewed, required imaging and test results available.  Sterile Technique Maximal sterile technique including full sterile barrier drape, hand hygiene, sterile gown, sterile gloves, mask, hair covering, sterile ultrasound probe cover (if used).  Procedure Description Area of catheter insertion was cleaned with chlorhexidine and draped in sterile fashion.   With real-time ultrasound guidance a HD catheter was placed into the right femoral vein.  Nonpulsatile blood flow and easy flushing noted in all ports.  The catheter was sutured in place and sterile dressing applied.  Complications/Tolerance None; patient tolerated the procedure well. Chest X-ray is ordered to verify placement for internal jugular or subclavian cannulation.  Chest x-ray is not ordered for femoral cannulation.  EBL Minimal  Specimen(s) None   Line was inserted to the 20 cm mark.  BIOPATCH applied to the insertion site.    Darel Hong, AGACNP-BC [place] [facility] Pager: [phone]

## 2020-04-16 NOTE — Progress Notes (Addendum)
[facility] SURGICAL PROGRESS NOTE (cpt [phone])  Hospital Day(s): 2.   Interval History: Patient seen and examined, no acute events or new complaints overnight. She has been able to remain off vasopressors. Patient appears much more somnolent this morning, shakes her head "no" when asked about pain. Lactic acidosis actually improving over the course of yesterday (10.9 --> 6.1 --> 5.7). However, she continues to have worsening MSOF with sCr - 4.48, AST >2,000, ALT 1500, Hyperphosphatemia to 8.4, Hyperkalemic to 5.5. Her leukocytosis is actually improved slightly this morning to 15.2K. She has had almost no urine output in the last 24 hours. JP drain still remains seropurulent. Cultures from drain placement with Gram (-) rods, Gram (+) cooci and rods  Review of Systems:  Unable to reliably preform ROS secondary to mental status  Vital signs in last 24 hours: [min-max] current  Temp:  [97.6 F (36.4 C)-99 F (37.2 C)] 97.6 F (36.4 C) (08/18 0000) Pulse Rate:  [96-108] 102 (08/18 0400) Resp:  [12-22] 16 (08/18 0400) BP: (75-128)/(43-85) 128/72 (08/18 0400) SpO2:  [90 %-100 %] 100 % (08/18 0400)     Height: 5\' 6"  (167.6 cm) Weight: 121.6 kg BMI (Calculated): 43.29   Intake/Output last 2 shifts:  08/17 0701 - 08/18 0700 In: 2036.4 [I.V.:1551.1; IV Piggyback:485.3] Out: 70 [Urine:10]   Physical Exam:  Constitutional: Resting in bed, appears more somnolent this morning, still arouses to verbal stimuli and shake her head yes/no HENT: normocephalic without obvious abnormality  Respiratory: On [place], audible inspiratory and expiratory wheezes, less tachypneic this morning, mouth breathing Cardiovascular: Tachycardic and sinus rhythm  Gastrointestinal: Abdomen is obese, appears softer this morning, I am unable to endorse any point tenderness this morning but again patient is not able to verbalize much, no rebound/guarding/peritonitis, JP in LLQ with seropurulent drainage   Genitourinary: Foley in place, minimal dark urine in bag  Musculoskeletal: + 1 pitting edema to lower extremities    Labs:  CBC Latest Ref Rng & Units 04/15/2020 04/14/2020 04/14/2020  WBC 4.0 - 10.5 K/uL 17.2(H) 7.4 5.1  Hemoglobin 12.0 - 15.0 g/dL 11.9(L) 13.7 14.0  Hematocrit 36 - 46 % 36.8 41.7 42.5  Platelets 150 - 400 K/uL 149(L) 294 333   CMP Latest Ref Rng & Units 04/16/2020 04/15/2020 04/14/2020  Glucose 70 - 99 mg/dL 145(H) 110(H) 108(H)  BUN 6 - 20 mg/dL 64(H) 44(H) 32(H)  Creatinine 0.44 - 1.00 mg/dL 4.48(H) 3.23(H) 1.78(H)  Sodium 135 - 145 mmol/L 135 135 136  Potassium 3.5 - 5.1 mmol/L 5.5(H) 5.2(H) 4.3  Chloride 98 - 111 mmol/L 102 104 104  CO2 22 - 32 mmol/L 16(L) 12(L) 17(L)  Calcium 8.9 - 10.3 mg/dL 6.6(L) 7.0(L) 7.9(L)  Total Protein 6.5 - 8.1 g/dL 5.3(L) 4.4(L) 5.4(L)  Total Bilirubin 0.3 - 1.2 mg/dL 0.8 0.6 0.5  Alkaline Phos 38 - 126 U/L 84 60 73  AST 15 - 41 U/L 2,020(H) 1,634(H) 35  ALT 0 - 44 U/L 1,556(H) 853(H) 38    Imaging studies: No new pertinent imaging studies   Assessment/Plan: (ICD-10's: K1.20) 59 y.o. female with improvement in her lactic acidosis, thought to be attributable to HIV medication (Genvoya), but with worsening MSOF and sepsis attributable sigmoid diverticulitis with abscess s/p percutaneous drainage on 08/16 but is without any evidence of peritonitis/pneumoperitoneum/pneumotosis on examination or imaging, complicated by pertinent comorbidities including marked COPD   - Again, she is without any evidence of peritonitis/pneumoperitoneum on exam/recent imaging and from an abdominal standpoint she clinically looks  improved. I do think avoiding emergent surgical intervention as long as possible is in her best interest as she will likely require ventilator support post-op which goes against her DNI wishes. We have source control with JP drain and broad spectrum IV ABx and she has been able to remain hemodynamically stable without vasopressors.     - Appreciate PCCM assistance with this critical patient    - Continue IV Abx; switched to Meropenem on 08/17; ID following --> Cultures from drain placement with Gram (-) rods, Gram (+) cooci and rods  - Recommend remain NPO; can consider NGT if develops nausea/emesis             - Monitor abdominal examination; leukocytosis; lactic acidosis, fever curve  - Manage electrolyte derangement              - Continue foley to monitor UO; renal function worsening; likely need to involve nephrology             - Pain control prn; antiemetics prn             - Maintain JP drain; record output             - Further management per primary service    All of the above findings and recommendations were discussed with the patient, and all of patient's questions were answered to her expressed satisfaction.  -- Edison Simon, PA-C [facility] 04/16/2020, 6:54 AM [phone] M-F: 7am - 4pm

## 2020-04-16 NOTE — Progress Notes (Signed)
ID Pt doing poorly BP 133/73   Pulse (!) 108   Temp 98.6 F (37 C) (Axillary)   Resp 19   Ht 5\' 6"  (1.676 m)   Wt 121.6 kg   SpO2 95%   BMI 43.27 kg/m    resp distress HD catheter rt femoral vein Dialysis being intiated abd drain- dark drainage   CBC Latest Ref Rng & Units 04/16/2020 04/15/2020 04/14/2020  WBC 4.0 - 10.5 K/uL 15.2(H) 17.2(H) 7.4  Hemoglobin 12.0 - 15.0 g/dL 11.5(L) 11.9(L) 13.7  Hematocrit 36 - 46 % 35.2(L) 36.8 41.7  Platelets 150 - 400 K/uL 94(L) 149(L) 294     CMP Latest Ref Rng & Units 04/16/2020 04/16/2020 04/15/2020  Glucose 70 - 99 mg/dL 172(H) 145(H) 110(H)  BUN 6 - 20 mg/dL 66(H) 64(H) 44(H)  Creatinine 0.44 - 1.00 mg/dL 4.65(H) 4.48(H) 3.23(H)  Sodium 135 - 145 mmol/L 135 135 135  Potassium 3.5 - 5.1 mmol/L 4.9 5.5(H) 5.2(H)  Chloride 98 - 111 mmol/L 101 102 104  CO2 22 - 32 mmol/L 14(L) 16(L) 12(L)  Calcium 8.9 - 10.3 mg/dL 6.7(L) 6.6(L) 7.0(L)  Total Protein 6.5 - 8.1 g/dL - 5.3(L) 4.4(L)  Total Bilirubin 0.3 - 1.2 mg/dL - 0.8 0.6  Alkaline Phos 38 - 126 U/L - 84 60  AST 15 - 41 U/L - 2,020(H) 1,634(H)  ALT 0 - 44 U/L - 1,556(H) 853(H)   Impression/recommendation  ?Septic shock secondary to perforated diverticulitis and peridiverticular abscess.  has JP drain inserted on 04/14/20. Worsening   With increasing lactic acidosis.  repeat CT scan on 8/16 did not show any perforation but with drain  culture having multiple organisms ( > 7- both aerobes and multiple anerobes ) concern that this could be stool and she may have perforation - but both CT and CXR doe snot show any free air  Zosyn was changed to  meropenem and anidulafungin on 04/15/20  Worsening liver and kidney failure sue to sepsis  HIV- well controlled in jan 2021 her Vl < 20 and cd4 > 1000. She was getting care at [facility] and saw her provider last in June 2021  Was On Genvoya okay to hold it for now.  But I do not think that was causing the lactic acidosis.  she is clearly septic. Also  labs on admission were normal and that would not be the case if it was due to genvoya Last CD4 count is 265 with 33% on 04/14/2020.  COPD Patient is also on  Solu-Medrol q6  Discussed the management with care team

## 2020-04-16 NOTE — Progress Notes (Signed)
Pt consent obtained by Darel Hong NP for dialysis catheter placement from daughter.  Time out performed at 1258. Darel Hong NP to place dialysis catheter with Juventino Slovak RN assisting. Catheter placed successfully. No complications noted. Pt given 0.5 mg Dilaudid pre-procedure.

## 2020-04-16 NOTE — Progress Notes (Signed)
[name] NAME: Melanie Bowen    MR#:  [id number]  DATE OF BIRTH:  01-14-1961  SUBJECTIVE:  CHIEF COMPLAINT:   Chief Complaint  Patient presents with  . Abdominal Pain  Patient sleeping, mouth breather.  No new issues. Daughter at [place] Hospital course:   Per HPI:  hypertension, diverticulosis, HIV, GERD and depression, presented with acute onset of worsening left lower quadrant abdominal pain with N/V  And constipation.  The patient started having pain Friday 9 days ago when she was seen in the ER and given p.o. Augmentin .    Upon presentation BP: 151/98 with otherwise normal vital signs.  Labs revealed albumin of 2.8 with troponin of 6.4 and otherwise unremarkable CMP.  Urinalysis was unremarkable.  Abdominal and pelvic CT scan revealed the following: 1. Findings consistent with acute sigmoid colon diverticulitis. 7.5 x 4.4 cm gas and fluid collection within the anterior pelvis suspicious for contained perforation/abscess, this appears to communicate with the thickened inflamed sigmoid colon. 2. Small amount of upper abdominal and pelvic ascites. 3. Thickened appearance of pelvic small bowel loops, likely reactive  The patient was given 1 L bolus of IV lactated Ringer, 1 p.o. Norco, half milligram of IV Dilaudid, 4 mg of IV morphine sulfate and 4 mg of IV Zofran as well as 3.375 g of IV Zosyn.  Dr. Christian Mate was consulted about the patient and he will notify IR in a.m.     04/14/2020 patient hemodynamically decline was transferred to critical care team Surgery interventional radiology critical care team was consulted  04/15/2020 -patient remained under PCCM care lactic acid continues to worsen, now 7.2 (previously 6.1).  CT Abdomen/Pelvis obtained at 22:00 earlier in the shift, which was NEGATIVE   04/16/2020 -patient remains critically ill, improving hemodynamically, status post septic shock, multiorgan failure including  renal failure -nephrology was consulted patient will remain in ICU      ASSESSMENT AND PLAN:    Septic shock  -source of infection diverticulosis/abscess -Status post IR placement of gallbladder drain  -Patient has hemodynamically has improved, but remains tachycardic Hypotensive blood pressure is low 75/55, currently improved to 131/70 on Levophed Lactic acid 5.7, 10.9, >>>6.1 -Remains lethargic, respiratory stress, audibly wheezing -Patient has refused BiPAP or intubation, confirmed DNI with family  -present on admission with hypotension, tachycardia, With multiorgan failure - acute liver and kidney failure -Levophed as need per PCCM -Source of infection is diverticular abscess status post CT-guided drainage.  Pelvic drainage catheter in stable position.  Surgery following.  Repeat CT scan shows last night on 8/16 no worsening. -Consult PCCM, general surgery, IR, nephrology following closely  Patient was initiated on Flagyl and Rocephin >>> has been switched to meropenem -Lactic acidosis worsening - tenofovir can cause severe lactic acidosis. ->  Genvoya is stopped for now. -Abdominal x-ray not showing any free air  Acute hypoxic respiratory failure secondary to COPD exacerbation Patient refused BiPAP, supplemental oxygen as needed PCCM following   Acute kidney failure & Hyperkalemia likely from septic shock and ATN Foley catheter placed on 8/17 per surgery/PCCM Creatinine worsened from 0.8->1.28-> 1.78-> 3.23 >>>>4.48 Oligoanuric Nephrology consulted -discussed with patient and family if no improvement may need urgent hemodialysis IV fluids were discontinued, initiated Lasix  Acute liver failure -Due to above, septic, septic shock AST of 1634, ALT 853 >>  Avoid hepatotoxic and nephrotoxic medications  Acute sigmoid diverticulitis and diverticular abscess s/p  CT-guided drainage by IR on 8/16 Surgery following IR consulted, status post CT-guided gallbladder  drain    Morbid obesity Complicates overall care Body mass index is 43.27 kg/m.     Palliative care c/s for GOC  Status is: Inpatient  Remains inpatient appropriate because:Hemodynamically unstable and IV treatments appropriate due to intensity of illness or inability to take PO   Dispo: The patient is from: Home              Anticipated d/c is to: Home              Anticipated d/c date is: > 3 days              Patient currently is not medically stable to d/c.    DVT prophylaxis:            enoxaparin (LOVENOX) injection 30 mg Start: 04/16/20 1000     Family Communication: discussed with Daughter at bedside   All the records are reviewed and case discussed with Care Management/Social Worker. Management plans discussed with the patient, family (Daughter at bedside) and they are in agreement.  CODE STATUS: Partial Code  TOTAL TIME TAKING CARE OF THIS PATIENT: 35 minutes.   More than 50% of the time was spent in counseling/coordination of care: YES  POSSIBLE D/C IN 3-4 DAYS, DEPENDING ON CLINICAL CONDITION.      DRUG ALLERGIES:   Allergies  Allergen Reactions  . Acetaminophen Nausea And Vomiting  . Ibuprofen Other (See Comments)    Reports causes bleeding  . Gabapentin Rash  . Morphine Itching and Rash  . Morphine And Related Itching  . Zanaflex  [Tizanidine] Rash   VITALS:  Blood pressure 131/70, pulse (!) 109, temperature 98.1 F (36.7 C), temperature source Axillary, resp. rate 16, height 5\' 6"  (1.676 m), weight 121.6 kg, SpO2 97 %. PHYSICAL EXAMINATION:      Physical Exam:   General:   Lethargic, labored breathing, awake, following some commands  HEENT:  Normocephalic, PERRL, otherwise with in Normal limits   Neuro:  CNII-XII intact. , normal motor and sensation, reflexes intact   Lungs:    Audibly wheezing, diffuse wheezes and rhonchi, labored breathing minimal in lower lobe -crackles  Cardio:    S1/S2, RRR, No murmure, No Rubs or Gallops    Abdomen:   Soft, diffuse abdominal tenderness, bowel sounds active all four quadrants,  no guarding or peritoneal signs.  Left lower quadrant JP drain  Muscular skeletal:  Limited exam - in bed, able to move all 4 extremities, Normal strength,  2+ pulses,  symmetric, No pitting edema  Skin:  Dry, warm to touch, negative for any Rashes, No open wounds  Wounds: Please see nursing documentation JP drain present in the left lower quadrant draining serosanguineous drainage           Foley inserted on 8/17 LABORATORY PANEL:  Female CBC Recent Labs  Lab 04/16/20 0348  WBC 15.2*  HGB 11.5*  HCT 35.2*  PLT 94*   ------------------------------------------------------------------------------------------------------------------ Chemistries  Recent Labs  Lab 04/16/20 0348 04/16/20 0348 04/16/20 0900  NA 135   < > 135  K 5.5*   < > 4.9  CL 102   < > 101  CO2 16*   < > 14*  GLUCOSE 145*   < > 172*  BUN 64*   < > 66*  CREATININE 4.48*   < > 4.65*  CALCIUM 6.6*   < > 6.7*  MG 1.9  --   --  AST 2,020*  --   --   ALT 1,556*  --   --   ALKPHOS 84  --   --   BILITOT 0.8  --   --    < > = values in this interval not displayed.   RADIOLOGY:  DG Abd 1 View  Result Date: 04/14/2020 CLINICAL DATA:  Abdominal pain.  Pelvic abscess drainage today. EXAM: ABDOMEN - 1 VIEW COMPARISON:  CT abdomen pelvis from same day. FINDINGS: The bowel gas pattern is normal. No intraperitoneal free air peer no radio-opaque calculi or other significant radiographic abnormality are seen. No acute osseous abnormality. Prior lumbar fusion. IMPRESSION: 1. Negative. Electronically Signed   By: Titus Dubin M.D.   On: 04/14/2020 14:37   CT ABDOMEN PELVIS W CONTRAST  Result Date: 04/14/2020 CLINICAL DATA:  Peritonitis or perforation suspected Patient with history of perforated diverticulitis post percutaneous drain placement today. EXAM: CT ABDOMEN AND PELVIS WITH CONTRAST TECHNIQUE: Multidetector CT imaging  of the abdomen and pelvis was performed using the standard protocol following bolus administration of intravenous contrast. CONTRAST:  30mL OMNIPAQUE IOHEXOL 300 MG/ML  SOLN COMPARISON:  Two prior abdominopelvic CT earlier this day, prior to drain placement.  IMPRESSION: 1. Interval percutaneous drainage of pelvic abscess. No residual fluid collection. 2. Small amount of non organized free fluid in the pelvis, pericolic gutters, and perihepatic right upper quadrant, slight increase from pre drainage CT earlier today. No evidence of new abscess. No free air or interval perforation. 3. Heterogeneous enhancement of both kidneys, nonspecific but can be seen with pyelonephritis. 4. Small pleural effusions are new from prior exam. Adjacent compressive atelectasis. Electronically Signed   By: Keith Rake M.D.   On: 04/14/2020 22:22   DG Abd Portable 2V  Result Date: 04/15/2020 CLINICAL DATA:  Diverticulitis.  Abdominal pain and distention. EXAM: PORTABLE ABDOMEN - 2 VIEW COMPARISON:  CT 04/14/2020. FINDINGS: Pelvic drainage catheter again noted. No bowel distention. Stool noted throughout the colon. No free air. Bibasilar atelectasis. Degenerative change lumbar spine and both hips. Contrast in the bladder from prior CT. IMPRESSION: 1.  Pelvic drainage catheter in stable position. 2. No acute abdominal abnormality identified. No bowel distention or free air. 3.  Bibasilar atelectasis. Electronically Signed   By: Marcello Moores  Register   On: 04/15/2020 08:07      Deatra James M.D on 04/16/2020 at 12:34 PM  [facility]   CC: Primary care physician; Theotis Burrow, MD

## 2020-04-16 NOTE — Consult Note (Signed)
[address] [place], [place] [address] Phone [phone]. Fax [phone]  Date: 04/16/2020                  Patient Name:  Melanie Bowen  MRN: [id number]  DOB: [birth year]  Age / Sex: 59 y.o., female         PCP: Revelo, Elyse Jarvis, MD                 Service Requesting Consult: IM/ Deatra James, MD                 Reason for Consult: ARF            History of Present Illness: Patient is a 59 y.o. female with medical problems of HIV, COPD, HTN, who was admitted to [facility] on 04/14/2020 for evaluation of abdominal pain.  Patient presented to the ER August 6 for abdominal pain.  There was question of diverticulitis versus possible UTI.  She was discharged on oral antibiotics but presented again to the ER for worsening abdominal pain. CT with IV contrast on August 16 showed findings consistent with acute sigmoid colon diverticulitis with gas and fluid collection suspicious for contained perforation or abscess.  CT-guided percutaneous drain was placed on August 16 by vascular interventional radiology.  40 mL of foul-smelling gray-colored fluid was removed along with gas.  Repeat CT with IV contrast same day was done for assessment which showed interval percutaneous drainage of pelvic abscess heterogeneous enhancement of both kidneys with minimal excretion for both kidneys on delayed phase imaging  Patient is lethargic and only partially able to participate in the interview.  Her daughter is in the room with her while on video conferencing the rest of the family members.    Medications: Outpatient medications: Medications Prior to Admission  Medication Sig Dispense Refill Last Dose  . amLODipine (NORVASC) 10 MG tablet Take 10 mg by mouth daily.    04/13/2020 at 1000  . DEXILANT 60 MG capsule Take 60 mg by mouth daily.   04/13/2020 at 1000  . gabapentin (NEURONTIN) 600 MG tablet Take 600 mg by mouth 3 (three) times daily.   04/13/2020 at 1600  . hydrochlorothiazide  (HYDRODIURIL) 25 MG tablet Take 25 mg by mouth daily.   04/13/2020 at 1000  . HYDROcodone-acetaminophen (NORCO/VICODIN) 5-325 MG tablet Take 1 tablet by mouth every 6 (six) hours as needed for severe pain. 20 tablet 0 04/13/2020 at 1800  . losartan (COZAAR) 50 MG tablet Take 50 mg by mouth daily.   04/13/2020 at 1000  . methocarbamol (ROBAXIN) 500 MG tablet Take 500 mg by mouth in the morning, at noon, in the evening, and at bedtime.   04/13/2020 at 1000  . WIXELA INHUB 250-50 MCG/DOSE AEPB Inhale 1 puff into the lungs 2 (two) times daily.   04/13/2020 at 1600  . albuterol (VENTOLIN HFA) 108 (90 Base) MCG/ACT inhaler Inhale 2 puffs into the lungs every 6 (six) hours as needed for wheezing or shortness of breath. 6.7 g 0 prn at prn  . elvitegravir-cobicistat-emtricitabine-tenofovir (GENVOYA) 150-150-200-10 MG TABS tablet Take 1 tablet by mouth at bedtime.   04/12/2020 at 2200  . famotidine (PEPCID) 20 MG tablet Take 20 mg by mouth daily.    prn at prn  . traMADol (ULTRAM) 50 MG tablet Take 50 mg by mouth every 6 (six) hours as needed.   prn at prn  . valACYclovir (VALTREX) 1000 MG tablet Take 1,000 mg by mouth at bedtime.  04/12/2020 at 2200    Current medications: Current Facility-Administered Medications  Medication Dose Route Frequency Provider Last Rate Last Admin  . 0.9 %  sodium chloride infusion   Intravenous Continuous Tylene Fantasia, PA-C 125 mL/hr at 04/16/20 0800 Rate Verify at 04/16/20 0800  . 0.9 %  sodium chloride infusion  250 mL Intravenous Continuous Darel Hong D, NP 10 mL/hr at 04/15/20 0543 250 mL at 04/15/20 0543  . acetaminophen (TYLENOL) tablet 650 mg  650 mg Oral Q6H PRN Mansy, Jan A, MD   650 mg at 04/14/20 0451   Or  . acetaminophen (TYLENOL) suppository 650 mg  650 mg Rectal Q6H PRN Mansy, Jan A, MD      . budesonide (PULMICORT) nebulizer solution 0.5 mg  0.5 mg Nebulization BID Flora Lipps, MD   0.5 mg at 04/15/20 2101  . Chlorhexidine Gluconate Cloth 2 % PADS 6  each  6 each Topical Daily Max Sane, MD   6 each at 04/14/20 1832  . enoxaparin (LOVENOX) injection 30 mg  30 mg Subcutaneous Q24H Manuella Ghazi, Vipul, MD      . famotidine (PEPCID) tablet 20 mg  20 mg Oral Daily Mansy, Jan A, MD   20 mg at 04/15/20 2313  . HYDROcodone-acetaminophen (NORCO/VICODIN) 5-325 MG per tablet 1-2 tablet  1-2 tablet Oral Q6H PRN Darel Hong D, NP      . HYDROmorphone (DILAUDID) injection 0.5-1 mg  0.5-1 mg Intravenous Q2H PRN Mansy, Jan A, MD   1 mg at 04/15/20 1707  . ipratropium-albuterol (DUONEB) 0.5-2.5 (3) MG/3ML nebulizer solution 3 mL  3 mL Nebulization Q4H Flora Lipps, MD   3 mL at 04/16/20 0410  . ipratropium-albuterol (DUONEB) 0.5-2.5 (3) MG/3ML nebulizer solution 3 mL  3 mL Nebulization Q4H PRN Darel Hong D, NP      . labetalol (NORMODYNE) injection 20 mg  20 mg Intravenous Q3H PRN Mansy, Jan A, MD      . magnesium hydroxide (MILK OF MAGNESIA) suspension 30 mL  30 mL Oral Daily PRN Mansy, Jan A, MD      . meropenem (MERREM) 1 g in sodium chloride 0.9 % 100 mL IVPB  1 g Intravenous Q12H Lu Duffel, [facility]   Stopped at 04/15/20 2342  . methylPREDNISolone sodium succinate (SOLU-MEDROL) 40 mg/mL injection 40 mg  40 mg Intravenous Q6H Darel Hong D, NP   40 mg at 04/16/20 0606  . mometasone-formoterol (DULERA) 200-5 MCG/ACT inhaler 2 puff  2 puff Inhalation BID Mansy, Arvella Merles, MD   2 puff at 04/16/20 0814  . norepinephrine (LEVOPHED) 4mg  in 226mL premix infusion  2-10 mcg/min Intravenous Titrated Darel Hong D, NP      . pantoprazole (PROTONIX) injection 40 mg  40 mg Intravenous Daily Max Sane, MD   40 mg at 04/15/20 0927  . sodium chloride flush (NS) 0.9 % injection 5 mL  5 mL Intracatheter Q8H Markus Daft, MD   5 mL at 04/16/20 0615  . valACYclovir (VALTREX) tablet 500 mg  500 mg Oral QHS Max Sane, MD   500 mg at 04/15/20 2313      Allergies: Allergies  Allergen Reactions  . Acetaminophen Nausea And Vomiting  . Ibuprofen Other (See  Comments)    Reports causes bleeding  . Gabapentin Rash  . Morphine Itching and Rash  . Morphine And Related Itching  . Zanaflex  [Tizanidine] Rash      Past Medical History: Past Medical History:  Diagnosis Date  . Acute GI hemorrhage  12/2015   required transfusion  . Allergic rhinitis   . Arthritis    DDD  . Asthma   . Blepharitis   . Bronchitis   . Cigarette smoker   . Claustrophobia   . COPD (chronic obstructive pulmonary disease) ([place])   . Cough   . DDD (degenerative disc disease), lumbar 04/10/2014  . Depression   . Diverticulosis   . Ectopic pregnancy   . Eye irritation   . Genital herpes   . GERD (gastroesophageal reflux disease)   . Headache   . HIV (human immunodeficiency virus infection) ([place])   . Hypertension   . Paresthesia of hand, bilateral   . Shortness of breath dyspnea    at times due to asthma     Past Surgical History: Past Surgical History:  Procedure Laterality Date  . BACK SURGERY  12/2014   lumbar lam. Dr. Deri Fuelling  . BACK SURGERY  12/25/2012   Removal lumbosubarachnoid shunt system, L5-S1 TF ESI, Dr. Virl Axe Minchew  . back surgery may 2016     lumbar   . BUNIONECTOMY Bilateral    feet  . COLONOSCOPY  12/06/2014   rescheduled from 11/01/2014 due to poor prep  . DIAGNOSTIC LAPAROSCOPY    . DISTAL FEMUR OSTEOTOMY Right    Dr. Earnestine Leys  . ESOPHAGOGASTRODUODENOSCOPY Left 01/11/2016   Procedure: ESOPHAGOGASTRODUODENOSCOPY (EGD);  Surgeon: Hulen Luster, MD;  Location[facility] END[facility]COPY;  Service: Endoscopy;  Laterality: Left;  . Finger fracture Right    5th finger  . FRACTURE SURGERY Right 01/25/2014   closed reduction & percutaneous pinning of 5th digit  . HAND SURGERY Bilateral    carpal tunnel  . JOINT REPLACEMENT Bilateral    knees  . KNEE SURGERY Bilateral 2003,2007   total Joint  . LAPAROSCOPY FOR ECTOPIC PREGNANCY    . PERIPHERAL VASCULAR CATHETERIZATION N/A 01/12/2016   Procedure: Visceral Angiography;  Surgeon: Algernon Huxley, MD;  Location: [place] CV LAB;  Service: Cardiovascular;  Laterality: N/A;  . PERIPHERAL VASCULAR CATHETERIZATION N/A 01/12/2016   Procedure: Visceral Artery Intervention;  Surgeon: Algernon Huxley, MD;  Location: [place] CV LAB;  Service: Cardiovascular;  Laterality: N/A;  . REVISION TOTAL KNEE ARTHROPLASTY Right 12/31/2002   Dr. Marry Guan  . TUBAL LIGATION    . VIDEO BRONCHOSCOPY N/A 06/30/2015   Procedure: VIDEO BRONCHOSCOPY WITHOUT FLUORO;  Surgeon: Vilinda Boehringer, MD;  Location: [facility] ORS;  Service: Cardiopulmonary;  Laterality: N/A;     Family History: Family History  Problem Relation Age of Onset  . Breast cancer Sister      Social History: Social History   Socioeconomic History  . Marital status: Single    Spouse name: Not on file  . Number of children: Not on file  . Years of education: Not on file  . Highest education level: Not on file  Occupational History  . Not on file  Tobacco Use  . Smoking status: Current Some Day Smoker    Packs/day: 0.25    Years: 38.00    Pack years: 9.50    Types: Cigarettes  . Smokeless tobacco: Never Used  . Tobacco comment: 1 cig daily  Vaping Use  . Vaping Use: Never used  Substance and Sexual Activity  . Alcohol use: Yes    Alcohol/week: 5.0 standard drinks    Types: 5 Glasses of wine per week    Comment: occ  . Drug use: Not Currently  . Sexual activity: Not on file  Other  Topics Concern  . Not on file  Social History Narrative  . Not on file   Social Determinants of Health   Financial Resource Strain:   . Difficulty of Paying Living Expenses:   Food Insecurity:   . Worried About [name] fundraiser in the Last Year:   . Arboriculturist in the Last Year:   Transportation Needs:   . Film/video editor (Medical):   Marland Kitchen Lack of Transportation (Non-Medical):   Physical Activity:   . Days of Exercise per Week:   . Minutes of Exercise per Session:   Stress:   . Feeling of Stress :   Social Connections:    . Frequency of Communication with Friends and Family:   . Frequency of Social Gatherings with Friends and Family:   . Attends Religious Services:   . Active Member of Clubs or Organizations:   . Attends Archivist Meetings:   Marland Kitchen Marital Status:   Intimate Partner Violence:   . Fear of Current or Ex-Partner:   . Emotionally Abused:   Marland Kitchen Physically Abused:   . Sexually Abused:      Review of Systems: Not available due to patient's acute illness Gen:  HEENT:  CV:  Resp:  GI: GU :  MS:  Derm:    Psych: Heme:  Neuro:  Endocrine  Vital Signs: Blood pressure 128/70, pulse (!) 104, temperature 98.1 F (36.7 C), temperature source Axillary, resp. rate 17, height 5\' 6"  (1.676 m), weight 121.6 kg, SpO2 97 %.   Intake/Output Summary (Last 24 hours) at 04/16/2020 0845 Last data filed at 04/16/2020 0800 Gross per 24 hour  Intake 2753.68 ml  Output 80 ml  Net 2673.68 ml    Weight trends: [facility] Weights   04/13/20 2127 04/14/20 1822  Weight: 117.9 kg 121.6 kg    Physical Exam: General:  Critically ill-appearing, laying in the bed  HEENT  dry oral mucous membranes  Neck:  Supple,   Lungs:  Nasal cannula oxygen, bilateral diffuse crackles  Heart::  Regular, tachycardic  Abdomen:  Soft, nondistended, left lower quadrant drain in place with cloudy fluid  Extremities:  No edema  Neurologic:  Lethargic but able to wake up and answer a few simple questions  Skin:  Warm and dry, no acute rashes  Access:  To be placed  Foley:  Foley catheter in place with minimal dark tea colored urine       Lab results: Basic Metabolic Panel: Recent Labs  Lab 04/13/20 2140 04/14/20 0648 04/15/20 0609 04/16/20 0348 04/16/20 0900  NA 135   < > 135 135 135  K 3.8   < > 5.2* 5.5* 4.9  CL 101   < > 104 102 101  CO2 23   < > 12* 16* 14*  GLUCOSE 125*   < > 110* 145* 172*  BUN 25*   < > 44* 64* 66*  CREATININE 0.80   < > 3.23* 4.48* 4.65*  CALCIUM 8.6*   < > 7.0* 6.6* 6.7*  MG  1.8  --   --  1.9  --   PHOS  --   --   --  8.4*  --    < > = values in this interval not displayed.    Liver Function Tests: Recent Labs  Lab 04/16/20 0348  AST 2,020*  ALT 1,556*  ALKPHOS 84  BILITOT 0.8  PROT 5.3*  ALBUMIN 2.2*   Recent Labs  Lab 04/13/20 2140  LIPASE  25   No results for input(s): AMMONIA in the last 168 hours.  CBC: Recent Labs  Lab 04/14/20 0648 04/14/20 0813 04/14/20 1245 04/14/20 1245 04/15/20 0609 04/16/20 0348  WBC   < > 5.1 7.4   < > 17.2* 15.2*  NEUTROABS  --  4.2 6.1  --   --   --   HGB   < > 14.0 13.7   < > 11.9* 11.5*  HCT   < > 42.5 41.7   < > 36.8 35.2*  MCV   < > 92 92.3   < > 95.1 92.9  PLT   < > 333 294   < > 149* 94*   < > = values in this interval not displayed.    Cardiac Enzymes: No results for input(s): CKTOTAL, TROPONINI in the last 168 hours.  BNP: Invalid input(s): POCBNP  CBG: Recent Labs  Lab 04/14/20 1828  GLUCAP 27    Microbiology: Recent Results (from the past 720 hour(s))  SARS Coronavirus 2 by RT PCR (hospital order, performed in [facility] hospital lab) Nasopharyngeal Nasopharyngeal Swab     Status: None   Collection Time: 04/14/20  2:17 AM   Specimen: Nasopharyngeal Swab  Result Value Ref Range Status   SARS Coronavirus 2 NEGATIVE NEGATIVE Final    Comment: (NOTE) SARS-CoV-2 target nucleic acids are NOT DETECTED.  The SARS-CoV-2 RNA is generally detectable in upper and lower respiratory specimens during the acute phase of infection. The lowest concentration of SARS-CoV-2 viral copies this assay can detect is 250 copies / mL. A negative result does not preclude SARS-CoV-2 infection and should not be used as the sole basis for treatment or other patient management decisions.  A negative result may occur with improper specimen collection / handling, submission of specimen other than nasopharyngeal swab, presence of viral mutation(s) within the areas targeted by this assay, and inadequate number  of viral copies (<250 copies / mL). A negative result must be combined with clinical observations, patient history, and epidemiological information.  Fact Sheet for Patients:   StrictlyIdeas.no  Fact Sheet for Healthcare Providers: BankingDealers.co.za  This test is not yet approved or  cleared by the [place] FDA and has been authorized for detection and/or diagnosis of SARS-CoV-2 by FDA under an Emergency Use Authorization (EUA).  This EUA will remain in effect (meaning this test can be used) for the duration of the COVID-19 declaration under Section 564(b)(1) of the Act, 21 U.S.C. section 360bbb-3(b)(1), unless the authorization is terminated or revoked sooner.  Performed at [facility], [address] [place], [place] [address]   CULTURE, BLOOD (ROUTINE X 2) w Reflex to ID Panel     Status: None (Preliminary result)   Collection Time: 04/14/20  8:14 AM   Specimen: BLOOD  Result Value Ref Range Status   Specimen Description BLOOD LEFT [facility]  Final   Special Requests   Final    BOTTLES DRAWN AEROBIC AND ANAEROBIC Blood Culture adequate volume   Culture   Final    NO GROWTH 2 DAYS Performed at [facility], [address], [place], [place] [address]    Report Status PENDING  Incomplete  CULTURE, BLOOD (ROUTINE X 2) w Reflex to ID Panel     Status: None (Preliminary result)   Collection Time: 04/14/20  8:14 AM   Specimen: BLOOD  Result Value Ref Range Status   Specimen Description BLOOD LEFT WRIST  Final   Special Requests   Final  BOTTLES DRAWN AEROBIC AND ANAEROBIC Blood Culture adequate volume   Culture   Final    NO GROWTH 2 DAYS Performed at [facility], [address] [place], [place] [address]    Report Status PENDING  Incomplete  Aerobic/Anaerobic Culture (surgical/deep wound)     Status: None (Preliminary result)   Collection Time: 04/14/20 11:14 AM   Specimen: Abscess  Result  Value Ref Range Status   Specimen Description   Final    ABSCESS Performed at [facility], [address] [place], [place] [address]    Special Requests   Final    NONE Performed at [facility], [address] [place], [place] [address]    Gram Stain   Final    NO WBC SEEN ABUNDANT GRAM NEGATIVE RODS FEW GRAM POSITIVE COCCI FEW GRAM POSITIVE RODS    Culture   Final    CULTURE REINCUBATED FOR BETTER GROWTH Performed at [facility], [place] [address], [place], [place] [address]    Report Status PENDING  Incomplete  MRSA PCR Screening     Status: None   Collection Time: 04/14/20  6:19 PM   Specimen: Nasopharyngeal  Result Value Ref Range Status   MRSA by PCR NEGATIVE NEGATIVE Final    Comment:        The GeneXpert MRSA Assay (FDA approved for NASAL specimens only), is one component of a comprehensive MRSA colonization surveillance program. It is not intended to diagnose MRSA infection nor to guide or monitor treatment for MRSA infections. Performed at [facility], [address], [place], [place] [address]   Urine Culture     Status: None   Collection Time: 04/15/20  4:28 AM   Specimen: Urine, Random  Result Value Ref Range Status   Specimen Description   Final    URINE, RANDOM Performed at [facility], [address] [place], [place] [address]    Special Requests   Final    NONE Performed at [facility], [address], [place], [place] [address]    Culture   Final    NO GROWTH Performed at [facility], [place] [address] [place],  [address]    Report Status 04/16/2020 FINAL  Final     Coagulation Studies: Recent Labs    04/14/20 1245 04/16/20 0348  LABPROT 14.1 22.7*  INR 1.1 2.1*    Urinalysis: Recent Labs    04/13/20 2140 04/15/20 0428  COLORURINE YELLOW* YELLOW*  LABSPEC 1.023 >1.046*  PHURINE 5.0 5.0  GLUCOSEU NEGATIVE NEGATIVE  HGBUR NEGATIVE NEGATIVE  BILIRUBINUR  NEGATIVE NEGATIVE  KETONESUR NEGATIVE NEGATIVE  PROTEINUR NEGATIVE NEGATIVE  NITRITE NEGATIVE NEGATIVE  LEUKOCYTESUR NEGATIVE NEGATIVE        Imaging: DG Abd 1 View  Result Date: 04/14/2020 CLINICAL DATA:  Abdominal pain.  Pelvic abscess drainage today. EXAM: ABDOMEN - 1 VIEW COMPARISON:  CT abdomen pelvis from same day. FINDINGS: The bowel gas pattern is normal. No intraperitoneal free air peer no radio-opaque calculi or other significant radiographic abnormality are seen. No acute osseous abnormality. Prior lumbar fusion. IMPRESSION: 1. Negative. Electronically Signed   By: Titus Dubin M.D.   On: 04/14/2020 14:37   CT ABDOMEN PELVIS W CONTRAST  Result Date: 04/14/2020 CLINICAL DATA:  Peritonitis or perforation suspected Patient with history of perforated diverticulitis post percutaneous drain placement today. EXAM: CT ABDOMEN AND PELVIS WITH CONTRAST TECHNIQUE: Multidetector CT imaging of the abdomen and pelvis was performed using the  standard protocol following bolus administration of intravenous contrast. CONTRAST:  2mL OMNIPAQUE IOHEXOL 300 MG/ML  SOLN COMPARISON:  Two prior abdominopelvic CT earlier this day, prior to drain placement. FINDINGS: Lower chest: Small pleural effusions are new from prior exam. Adjacent compressive atelectasis. Breathing motion artifact in the lung bases limits assessment. Hepatobiliary: Small amount of perihepatic fluid slightly increased from prior exam. This appears mildly complex. High-density contents in the urinary bladder increasing likely vicarious excretion of IV contrast. There is no discrete focal hepatic abnormality allowing for motion. Pancreas: Motion artifact through the pancreas. No obvious inflammation. Spleen: Normal allowing for motion. Adrenals/Urinary Tract: No adrenal nodule. There is heterogeneous enhancement of both kidneys. No significant perinephric edema. Minimal excretion from both kidneys on delayed phase imaging. There is excreted  IV contrast within the urinary bladder. Stomach/Bowel: New surgical drain within the pelvic fluid collection. No definite residual fluid. A drain is closely approximated to fecalized loops of small bowel in the pelvis. Motion artifact limits detailed bowel assessment. Similar appearance of scattered small bowel thickening from prior exam which is likely reactive. There is no evidence of obstruction. No bowel pneumatosis. Diverticulosis involving the colon. No new colonic inflammation or diverticulitis. Air-fluid level in the stomach with small amount of fluid in the distal esophagus. Vascular/Lymphatic: Aortic atherosclerosis. No aortic aneurysm. No evidence of portal venous or mesenteric gas. No bulky adenopathy. Reproductive: Uterus and bilateral adnexa are unremarkable. Other: Surgical drain within the pelvic fluid collection which appears completely decompressed. No definite residual collection. Generalized fat stranding involving the lower small bowel mesentery. Small amount of non organized free fluid in the dependent pelvis, tracking into the pericolic gutters and right upper quadrant. There is no definite free air. Musculoskeletal: Stable. Postsurgical change in the lower lumbar spine. IMPRESSION: 1. Interval percutaneous drainage of pelvic abscess. No residual fluid collection. 2. Small amount of non organized free fluid in the pelvis, pericolic gutters, and perihepatic right upper quadrant, slight increase from pre drainage CT earlier today. No evidence of new abscess. No free air or interval perforation. 3. Heterogeneous enhancement of both kidneys, nonspecific but can be seen with pyelonephritis. 4. Small pleural effusions are new from prior exam. Adjacent compressive atelectasis. Electronically Signed   By: Keith Rake M.D.   On: 04/14/2020 22:22   DG Abd Portable 2V  Result Date: 04/15/2020 CLINICAL DATA:  Diverticulitis.  Abdominal pain and distention. EXAM: PORTABLE ABDOMEN - 2 VIEW  COMPARISON:  CT 04/14/2020. FINDINGS: Pelvic drainage catheter again noted. No bowel distention. Stool noted throughout the colon. No free air. Bibasilar atelectasis. Degenerative change lumbar spine and both hips. Contrast in the bladder from prior CT. IMPRESSION: 1.  Pelvic drainage catheter in stable position. 2. No acute abdominal abnormality identified. No bowel distention or free air. 3.  Bibasilar atelectasis. Electronically Signed   By: Marcello Moores  Register   On: 04/15/2020 08:07   CT IMAGE GUIDED DRAINAGE PERCUT CATH  PERITONEAL RETROPERIT  Result Date: 04/14/2020 INDICATION: 59 year old with air-fluid collection in the lower abdomen / upper pelvic region. Findings are suggestive for a colonic diverticular abscess. Patient needs CT-guided drainage. EXAM: CT-GUIDED DRAINAGE OF PELVIC ABSCESS MEDICATIONS: Fentanyl 25 mcg ANESTHESIA/SEDATION: The patient was continuously monitored during the procedure by the interventional radiology nurse under my direct supervision. COMPLICATIONS: None immediate. PROCEDURE: Informed written consent was obtained from the patient after a thorough discussion of the procedural risks, benefits and alternatives. All questions were addressed. A timeout was performed prior to the initiation of the procedure. Patient  was placed supine on the CT scanner. Images through the lower abdomen and pelvis were performed. Air-fluid collection in the lower abdomen/pelvic region was identified and targeted for drainage. The anterior abdomen was prepped with chlorhexidine and sterile field was created. Skin and soft tissues were anesthetized using 1% lidocaine. A small incision was made. Using CT guidance, a 18 gauge trocar needle was directed into the air-fluid collection. Gray purulent fluid was coming out of the needle. Super stiff Amplatz wire was advanced into the collection and follow up CT images were obtained. The drain was dilated to accommodate a 12 [place] multipurpose drain. 12 French  drain was placed. 40 mL gray colored purulent fluid and gas was removed from the collection. Follow up CT images were obtained. Catheter was sutured to skin and attached to a suction bulb. FINDINGS: Air-fluid collection just cephalad to the urinary bladder. Drain was successfully placed within the collection and 40 mL of purulent fluid was removed. The abscess was decompressed at the end of the procedure. IMPRESSION: CT-guided placement of a drainage catheter within the lower abdomen/pelvic abscess. Electronically Signed   By: Markus Daft M.D.   On: 04/14/2020 11:48      Assessment & Plan: Pt is a 59 y.o. African American  female with HIV disease, COPD, hypertension, was admitted on 04/14/2020 with Dyspnea [R06.00] Colonic diverticular abscess [K57.20] Diverticulitis of large intestine with abscess without bleeding [K57.20] Abscess of sigmoid colon due to diverticulitis [K57.20]   #Acute kidney injury #Severe sepsis from perforated sigmoid diverticulitis with abscess status post percutaneous drainage on August 16 #Shortness of breath, likely acute pulmonary edema  AKI is likely multifactorial due to severe sepsis and multiple IV contrast exposures on August 16 for CT abdomen.  Patient is currently oliguric and starting to develop volume overload.  Discontinue IV normal saline supplementation.  Discontinue IV albumin. -We will try 1 dose of IV Lasix -Discussed with family that if urine output remains low and respiratory status remains compromised, we will need to start dialysis later today.  Multiple family members who were on video conferencing as well as patient daughter in the room agreed to proceed if necessary.  Patient seems to understand the gravity of the situation and seems to agree with proceeding with dialysis.  Discussed with ICU team    LOS: 2 Lavora Brisbon Candiss Norse 8/18/20218:45 AM    Note: This note was prepared with Dragon dictation. Any transcription errors are unintentional

## 2020-04-17 DIAGNOSIS — K7290 Hepatic failure, unspecified without coma: Secondary | ICD-10-CM

## 2020-04-17 LAB — COMPREHENSIVE METABOLIC PANEL
ALT: 1153 U/L — ABNORMAL HIGH (ref 0–44)
AST: 791 U/L — ABNORMAL HIGH (ref 15–41)
Albumin: 2.1 g/dL — ABNORMAL LOW (ref 3.5–5.0)
Alkaline Phosphatase: 127 U/L — ABNORMAL HIGH (ref 38–126)
Anion gap: 17 — ABNORMAL HIGH (ref 5–15)
BUN: 63 mg/dL — ABNORMAL HIGH (ref 6–20)
CO2: 17 mmol/L — ABNORMAL LOW (ref 22–32)
Calcium: 6.9 mg/dL — ABNORMAL LOW (ref 8.9–10.3)
Chloride: 102 mmol/L (ref 98–111)
Creatinine, Ser: 4.51 mg/dL — ABNORMAL HIGH (ref 0.44–1.00)
GFR calc Af Amer: 12 mL/min — ABNORMAL LOW (ref >60)
GFR calc non Af Amer: 10 mL/min — ABNORMAL LOW (ref >60)
Glucose, Bld: 110 mg/dL — ABNORMAL HIGH (ref 70–99)
Potassium: 4.9 mmol/L (ref 3.5–5.1)
Sodium: 136 mmol/L (ref 135–145)
Total Bilirubin: 0.9 mg/dL (ref 0.3–1.2)
Total Protein: 5.5 g/dL — ABNORMAL LOW (ref 6.5–8.1)

## 2020-04-17 LAB — CBC
HCT: 31.5 % — ABNORMAL LOW (ref 36.0–46.0)
Hemoglobin: 11.5 g/dL — ABNORMAL LOW (ref 12.0–15.0)
MCH: 30.9 pg (ref 26.0–34.0)
MCHC: 36.5 g/dL — ABNORMAL HIGH (ref 30.0–36.0)
MCV: 84.7 fL (ref 80.0–100.0)
Platelets: 49 10*3/uL — ABNORMAL LOW (ref 150–400)
RBC: 3.72 MIL/uL — ABNORMAL LOW (ref 3.87–5.11)
RDW: 18.5 % — ABNORMAL HIGH (ref 11.5–15.5)
WBC: 22.6 10*3/uL — ABNORMAL HIGH (ref 4.0–10.5)
nRBC: 1.7 % — ABNORMAL HIGH (ref 0.0–0.2)

## 2020-04-17 LAB — PHOSPHORUS: Phosphorus: 5.3 mg/dL — ABNORMAL HIGH (ref 2.5–4.6)

## 2020-04-17 LAB — MAGNESIUM: Magnesium: 1.7 mg/dL (ref 1.7–2.4)

## 2020-04-17 LAB — HEPATITIS B SURFACE ANTIBODY, QUANTITATIVE: Hep B S AB Quant (Post): 12.7 m[IU]/mL (ref >9.9)

## 2020-04-17 MED ORDER — ALPRAZOLAM 0.5 MG PO TABS
1.0000 mg | ORAL_TABLET | Freq: Three times a day (TID) | ORAL | Status: DC | PRN
  Administered 2020-04-19 – 2020-05-02 (×10): 1 mg via ORAL
  Filled 2020-04-17 (×12): qty 2

## 2020-04-17 MED ORDER — LORAZEPAM 2 MG/ML IJ SOLN
1.0000 mg | Freq: Once | INTRAMUSCULAR | Status: AC
  Administered 2020-04-17: 1 mg via INTRAVENOUS
  Filled 2020-04-17: qty 1

## 2020-04-17 MED ORDER — DILTIAZEM HCL 25 MG/5ML IV SOLN
5.0000 mg | Freq: Once | INTRAVENOUS | Status: AC
  Administered 2020-04-17: 5 mg via INTRAVENOUS
  Filled 2020-04-17: qty 5

## 2020-04-17 MED ORDER — LORAZEPAM 2 MG/ML IJ SOLN: 1.0000 mg | Freq: Once | INTRAMUSCULAR | Status: AC

## 2020-04-17 MED ORDER — MIDAZOLAM HCL 2 MG/2ML IJ SOLN: 2.0000 mg | INTRAMUSCULAR | Status: AC

## 2020-04-17 MED ORDER — MIDAZOLAM HCL 2 MG/2ML IJ SOLN
INTRAMUSCULAR | Status: AC
  Administered 2020-04-17: 2 mg via INTRAVENOUS
  Filled 2020-04-17: qty 2

## 2020-04-17 NOTE — Consult Note (Signed)
[address] [place], [place] [address] Phone [phone]. Fax [phone]  Date: 04/17/2020                  Patient Name:  Melanie Bowen  MRN: [id number]  DOB: 09/29/1960  Age / Sex: 59 y.o., female         PCP: Revelo, Elyse Jarvis, MD                 Service Requesting Consult: IM/ Deatra James, MD                 Reason for Consult: ARF            History of Present Illness: Patient is a 59 y.o. female with medical problems of HIV, COPD, HTN, who was admitted to [facility] on 04/14/2020 for evaluation of abdominal pain.  Patient presented to the ER August 6 for abdominal pain.  There was question of diverticulitis versus possible UTI.  She was discharged on oral antibiotics but presented again to the ER for worsening abdominal pain. CT with IV contrast on August 16 showed findings consistent with acute sigmoid colon diverticulitis with gas and fluid collection suspicious for contained perforation or abscess.  CT-guided percutaneous drain was placed on August 16 by vascular interventional radiology.  40 mL of foul-smelling gray-colored fluid was removed along with gas.  Repeat CT with IV contrast same day was done for assessment which showed interval percutaneous drainage of pelvic abscess heterogeneous enhancement of both kidneys with minimal excretion for both kidneys on delayed phase imaging  Patient still looks lethargic, tachypnea with accessory muscle use, and tachycardic with HR up to 140's noted on the heart moniotor Patient's daughter is in the room with her.  States that she is not eating No nausea or vomiting reported She does endorse some difficulty breathing.    Vital Signs: Blood pressure 125/85, pulse (!) 31, temperature 98.6 F (37 C), temperature source Oral, resp. rate (!) 28, height 5\' 6"  (1.676 m), weight 121.6 kg, SpO2 94 %.   Intake/Output Summary (Last 24 hours) at 04/17/2020 0848 Last data filed at 04/17/2020 0839 Gross per 24 hour   Intake 879.74 ml  Output 160 ml  Net 719.74 ml    Weight trends: [facility] Weights   04/13/20 2127 04/14/20 1822  Weight: 117.9 kg 121.6 kg    Physical Exam: General:  Critically ill-looking, lethargic,opens eyes to call  HEENT  dry oral mucous membranes  Neck:  Supple,   Lungs:  Breathing Labored with accessory muscle use, bilateral diffuse crackles,[place] with O2 6L  Heart::  irregular, tachycardic  130's to 140's on heart monitor  Abdomen:  Soft, nondistended, LLQ JP drain with minimal output  Extremities:  No edema  Neurologic:  Lethargic but able to wake up to call, nods her head,smiles when introduced  Skin:  Warm and dry, no acute rashes  Access: Rt Femoral   Foley:  Foley catheter in place with very minimal output       Lab results: Basic Metabolic Panel: Recent Labs  Lab 04/13/20 2140 04/14/20 0648 04/16/20 0348 04/16/20 0900 04/17/20 0603  NA 135   < > 135 135 136  K 3.8   < > 5.5* 4.9 4.9  CL 101   < > 102 101 102  CO2 23   < > 16* 14* 17*  GLUCOSE 125*   < > 145* 172* 110*  BUN 25*   < > 64* 66* 63*  CREATININE  0.80   < > 4.48* 4.65* 4.51*  CALCIUM 8.6*   < > 6.6* 6.7* 6.9*  MG 1.8  --  1.9  --  1.7  PHOS  --   --  8.4*  --  5.3*   < > = values in this interval not displayed.    Liver Function Tests: Recent Labs  Lab 04/17/20 0603  AST 791*  ALT 1,153*  ALKPHOS 127*  BILITOT 0.9  PROT 5.5*  ALBUMIN 2.1*   Recent Labs  Lab 04/13/20 2140  LIPASE 25   No results for input(s): AMMONIA in the last 168 hours.  CBC: Recent Labs  Lab 04/14/20 0648 04/14/20 0813 04/14/20 1245 04/14/20 1245 04/15/20 0609 04/16/20 0348  WBC   < > 5.1 7.4   < > 17.2* 15.2*  NEUTROABS  --  4.2 6.1  --   --   --   HGB   < > 14.0 13.7   < > 11.9* 11.5*  HCT   < > 42.5 41.7   < > 36.8 35.2*  MCV   < > 92 92.3   < > 95.1 92.9  PLT   < > 333 294   < > 149* 94*   < > = values in this interval not displayed.    Cardiac Enzymes: No results for input(s): CKTOTAL,  TROPONINI in the last 168 hours.  BNP: Invalid input(s): POCBNP  CBG: Recent Labs  Lab 04/14/20 1828  GLUCAP 101    Microbiology: Recent Results (from the past 720 hour(s))  SARS Coronavirus 2 by RT PCR (hospital order, performed in [facility] hospital lab) Nasopharyngeal Nasopharyngeal Swab     Status: None   Collection Time: 04/14/20  2:17 AM   Specimen: Nasopharyngeal Swab  Result Value Ref Range Status   SARS Coronavirus 2 NEGATIVE NEGATIVE Final    Comment: (NOTE) SARS-CoV-2 target nucleic acids are NOT DETECTED.  The SARS-CoV-2 RNA is generally detectable in upper and lower respiratory specimens during the acute phase of infection. The lowest concentration of SARS-CoV-2 viral copies this assay can detect is 250 copies / mL. A negative result does not preclude SARS-CoV-2 infection and should not be used as the sole basis for treatment or other patient management decisions.  A negative result may occur with improper specimen collection / handling, submission of specimen other than nasopharyngeal swab, presence of viral mutation(s) within the areas targeted by this assay, and inadequate number of viral copies (<250 copies / mL). A negative result must be combined with clinical observations, patient history, and epidemiological information.  Fact Sheet for Patients:   StrictlyIdeas.no  Fact Sheet for Healthcare Providers: BankingDealers.co.za  This test is not yet approved or  cleared by the [place] FDA and has been authorized for detection and/or diagnosis of SARS-CoV-2 by FDA under an Emergency Use Authorization (EUA).  This EUA will remain in effect (meaning this test can be used) for the duration of the COVID-19 declaration under Section 564(b)(1) of the Act, 21 U.S.C. section 360bbb-3(b)(1), unless the authorization is terminated or revoked sooner.  Performed at [facility], [address]  [place], [place] [address]   CULTURE, BLOOD (ROUTINE X 2) w Reflex to ID Panel     Status: None (Preliminary result)   Collection Time: 04/14/20  8:14 AM   Specimen: BLOOD  Result Value Ref Range Status   Specimen Description BLOOD LEFT [facility]  Final   Special Requests   Final    BOTTLES DRAWN  AEROBIC AND ANAEROBIC Blood Culture adequate volume   Culture   Final    NO GROWTH 3 DAYS Performed at [facility], [address] [place], [place] [address]    Report Status PENDING  Incomplete  CULTURE, BLOOD (ROUTINE X 2) w Reflex to ID Panel     Status: None (Preliminary result)   Collection Time: 04/14/20  8:14 AM   Specimen: BLOOD  Result Value Ref Range Status   Specimen Description BLOOD LEFT WRIST  Final   Special Requests   Final    BOTTLES DRAWN AEROBIC AND ANAEROBIC Blood Culture adequate volume   Culture   Final    NO GROWTH 3 DAYS Performed at [facility], [address] [place], [place] [address]    Report Status PENDING  Incomplete  Aerobic/Anaerobic Culture (surgical/deep wound)     Status: None (Preliminary result)   Collection Time: 04/14/20 11:14 AM   Specimen: Abscess  Result Value Ref Range Status   Specimen Description   Final    ABSCESS Performed at [facility], [address], [place], [place] [address]    Special Requests   Final    NONE Performed at [facility], [place], [place] [address]    Gram Stain   Final    NO WBC SEEN ABUNDANT GRAM NEGATIVE RODS FEW GRAM POSITIVE COCCI FEW GRAM POSITIVE RODS Performed at [facility], [place] [address] [place], [place] [address]    Culture   Final    CULTURE REINCUBATED FOR BETTER GROWTH MIXED ANAEROBIC FLORA PRESENT.  CALL LAB IF FURTHER IID REQUIRED.    Report Status PENDING  Incomplete  MRSA PCR Screening     Status: None   Collection Time: 04/14/20  6:19 PM   Specimen: Nasopharyngeal  Result Value Ref Range Status   MRSA by PCR NEGATIVE NEGATIVE  Final    Comment:        The GeneXpert MRSA Assay (FDA approved for NASAL specimens only), is one component of a comprehensive MRSA colonization surveillance program. It is not intended to diagnose MRSA infection nor to guide or monitor treatment for MRSA infections. Performed at [facility], [address] [place], [place] [address]   Urine Culture     Status: None   Collection Time: 04/15/20  4:28 AM   Specimen: Urine, Random  Result Value Ref Range Status   Specimen Description   Final    URINE, RANDOM Performed at [facility], [address], [place], [place] [address]    Special Requests   Final    NONE Performed at [facility], [address] [place], [place] [address]    Culture   Final    NO GROWTH Performed at [facility], [place] [address] [place], [place] [address]    Report Status 04/16/2020 FINAL  Final     Coagulation Studies: Recent Labs    04/14/20 1245 04/16/20 0348  LABPROT 14.1 22.7*  INR 1.1 2.1*    Urinalysis: Recent Labs    04/15/20 0428  COLORURINE YELLOW*  LABSPEC >1.046*  PHURINE 5.0  GLUCOSEU NEGATIVE  HGBUR NEGATIVE  BILIRUBINUR NEGATIVE  KETONESUR NEGATIVE  PROTEINUR NEGATIVE  NITRITE NEGATIVE  LEUKOCYTESUR NEGATIVE        Imaging: US RENAL  Result Date: 04/16/2020 CLINICAL DATA:  Acute renal failure. EXAM: RENAL / URINARY TRACT ULTRASOUND COMPLETE COMPARISON:  CT abdomen and pelvis 04/14/2020 FINDINGS: Right Kidney: Renal measurements: 11.5  x 5.3 x 4.9 cm = volume: 158 mL. Echogenicity within normal limits. No mass or hydronephrosis visualized. Left Kidney: Renal measurements: 11.3 x 5.5 x 6.1 cm = volume: 197 mL. Echogenicity within normal limits. No mass or hydronephrosis visualized. Bladder: Decompressed by Foley catheter. Other: None. IMPRESSION: Negative renal ultrasound. Electronically Signed   By: Logan Bores M.D.   On: 04/16/2020 13:01    Scheduled Meds: . budesonide  (PULMICORT) nebulizer solution  0.5 mg Nebulization BID  . Chlorhexidine Gluconate Cloth  6 each Topical Daily  . famotidine  20 mg Oral Daily  . guaiFENesin-dextromethorphan  10 mL Oral Q8H  . levalbuterol  1.25 mg Nebulization Q8H  . methylPREDNISolone (SOLU-MEDROL) injection  40 mg Intravenous Q6H  . mometasone-formoterol  2 puff Inhalation BID  . pantoprazole (PROTONIX) IV  40 mg Intravenous Daily  . sodium chloride flush  5 mL Intracatheter Q8H   Continuous Infusions: . sodium chloride 10 mL/hr at 04/16/20 1200  . amiodarone 60 mg/hr (04/17/20 0600)  . anidulafungin 5 mL/hr at 04/17/20 0600  . meropenem (MERREM) IV Stopped (04/16/20 2204)  . norepinephrine (LEVOPHED) Adult infusion     PRN Meds:.acetaminophen **OR** acetaminophen, heparin NICU/SCN flush, HYDROcodone-acetaminophen, HYDROmorphone (DILAUDID) injection, ipratropium-albuterol, labetalol, magnesium hydroxide   Assessment & Plan: Pt is a 59 y.o. African American  female with HIV disease, COPD, hypertension, was admitted on 04/14/2020 with Dyspnea [R06.00] Colonic diverticular abscess [K57.20] Diverticulitis of large intestine with abscess without bleeding [K57.20] Abscess of sigmoid colon due to diverticulitis [K57.20]   #Acute kidney injury #Severe sepsis from perforated sigmoid diverticulitis with abscess status post percutaneous drainage on August 16 #Shortness of breath, likely acute pulmonary edema  -AKI is likely multifactorial due to severe sepsis and multiple IV contrast exposures on August 16 for CT abdomen.  Patient is currently oliguric, received dialysis yesterday  - Plan for short Dialysis today - Discussed with family our concerns about hemodynamic instability at present and possibly with dialysis  - Slow BFR with careful UF   Discussed with ICU team    LOS: 3 Carletta Feasel 8/19/20218:48 AM    Note: This note was prepared with Dragon dictation. Any transcription errors are unintentional

## 2020-04-17 NOTE — Progress Notes (Signed)
Assisted tele visit to patient with family member.  Cristina Mattern D Vayda Dungee, RN   

## 2020-04-17 NOTE — Progress Notes (Addendum)
[facility] SURGICAL PROGRESS NOTE (cpt [phone])  Hospital Day(s): 3.   Interval History:  Patient seen and examined Started on HD late in the day yesterday (08/18)  She has been able to relatively maintain hemodynamics aside from HR, is on 60 mg/hr amiodarone for afib with RVR, has not required vasopressors Patient still quite somnolent, daughter at bedside noted she was more alert earlier in the morning CBC pending, will follow results Lactic acidosis is resolved Renal function stable elevated, sCr - 4.65, UO increased but still markedly low at 125 ccs Electrolyte derangements improved LFTs continue to improve, AST down to 791, ALT down to 1100 JP drain with 20 ccs out in last 24 hours, remains seropurulent  Review of Systems:  Unable to reliably preform secondary to mental status   Vital signs in last 24 hours: [min-max] current  Temp:  [98.1 F (36.7 C)-98.6 F (37 C)] 98.1 F (36.7 C) (08/19 0400) Pulse Rate:  [50-147] 50 (08/19 0700) Resp:  [15-28] 27 (08/19 0700) BP: (112-144)/(54-100) 116/71 (08/19 0700) SpO2:  [89 %-100 %] 100 % (08/19 0700)     Height: 5\' 6"  (167.6 cm) Weight: 121.6 kg BMI (Calculated): 43.29   Intake/Output last 2 shifts:  08/18 0701 - 08/19 0700 In: 971 [I.V.:503.7; IV Piggyback:467.3] Out: 145 [Urine:125; Drains:20]   Physical Exam:  Constitutional: Resting in bed, appears more somnolent this morning, still arouses to verbal stimuli and shake her head yes/no HENT: normocephalic without obvious abnormality  Respiratory:On [place], audible inspiratory and expiratory wheezes, still tachypneic, mouth breathing, using accessory muscles Cardiovascular:Tachycardic and irregularly irregular Gastrointestinal:Abdomen is obese, appears softer this morning, I am unable to endorse any point tenderness this morning but again patient is not able to verbalize much, no rebound/guarding/peritonitis, JP in LLQ with seropurulent drainage, this appears  to be slowing and thinning some  Genitourinary: Foley in place, minimal very dark urine in bag Musculoskeletal:+ 1 pitting edema to lower extremities, HD catheter in right groin   Labs:  CBC Latest Ref Rng & Units 04/16/2020 04/15/2020 04/14/2020  WBC 4.0 - 10.5 K/uL 15.2(H) 17.2(H) 7.4  Hemoglobin 12.0 - 15.0 g/dL 11.5(L) 11.9(L) 13.7  Hematocrit 36 - 46 % 35.2(L) 36.8 41.7  Platelets 150 - 400 K/uL 94(L) 149(L) 294   CMP Latest Ref Rng & Units 04/17/2020 04/16/2020 04/16/2020  Glucose 70 - 99 mg/dL 110(H) 172(H) 145(H)  BUN 6 - 20 mg/dL 63(H) 66(H) 64(H)  Creatinine 0.44 - 1.00 mg/dL 4.51(H) 4.65(H) 4.48(H)  Sodium 135 - 145 mmol/L 136 135 135  Potassium 3.5 - 5.1 mmol/L 4.9 4.9 5.5(H)  Chloride 98 - 111 mmol/L 102 101 102  CO2 22 - 32 mmol/L 17(L) 14(L) 16(L)  Calcium 8.9 - 10.3 mg/dL 6.9(L) 6.7(L) 6.6(L)  Total Protein 6.5 - 8.1 g/dL 5.5(L) - 5.3(L)  Total Bilirubin 0.3 - 1.2 mg/dL 0.9 - 0.8  Alkaline Phos 38 - 126 U/L 127(H) - 84  AST 15 - 41 U/L 791(H) - 2,020(H)  ALT 0 - 44 U/L 1,153(H) - 1,556(H)    Imaging studies: No new pertinent imaging studies   Assessment/Plan: (ICD-10's: K13.20) 58 y.o. female with new onset atrial fibrillation but otherwise hemodynamically stable with resolution in her lactic acidosis and some improvement in her MSOF, aside from significant AKI now on HD, attributable to septic shock secondary to sigmoid diverticulitis with abscess s/p percutaneous drainage on 08/16 but she continues to be without any evidence of peritonitis/pneumoperitoneum/pneumatosis on examination or imaging, complicated by pertinent comorbidities includingmarked COPD   -  Again, she is without any evidence of peritonitis/pneumoperitoneum on exam/recent imaging and from an abdominal standpoint she clinically looks improved. Aside from her renal function, her labs are improving slowly and I think we may be starting to catch back up to her initial septic presentation. I do think  avoiding emergent surgical intervention as long as possible is in her best interest as she will likely require ventilator support post-op which goes against her DNI wishes. We have source control with JP drain and broad spectrum IV ABx and she has been able to remain hemodynamically stable (aside from Afib with RVR) without vasopressors.     - Appreciate PCCM assistance with this critical patient               - Continue IV Abx; switched to Meropenem and andifungalin on 08/17; ID following --> Cultures from drain placement with Gram (-) rods, Gram (+) cooci and rods             - Recommend remain NPO; we may need to start to consider TPN when feasible  - Can consider NGT if develops nausea/emesis - Monitor abdominal examination; leukocytosis; lactic acidosis, fever curve             - Manage electrolyte derangement  - Continue foley to monitor UO; now on HD; appreciate nephrology assistance - Pain control prn; antiemetics prn - Maintain JP drain; record output - Further management per primary service    All of the above findings and recommendations were discussed with the patient, and all of patient's questions were answered to her expressed satisfaction.  -- Edison Simon, PA-C [facility] 04/17/2020, 7:24 AM [phone] M-F: 7am - 4pm

## 2020-04-17 NOTE — Progress Notes (Signed)
[facility] [place] NAME: Caoilainn Sacks    MR#:  [id number]  DATE OF BIRTH:  28-Jun-1961  SUBJECTIVE:  CHIEF COMPLAINT:   Chief Complaint  Patient presents with  . Abdominal Pain  Patient sleeping, mouth breather.  No new issues. Daughter at [facility] course:   Per HPI:  hypertension, diverticulosis, HIV, GERD and depression, presented with acute onset of worsening left lower quadrant abdominal pain with N/V  And constipation.  The patient started having pain Friday 9 days ago when she was seen in the ER and given p.o. Augmentin .    Upon presentation BP: 151/98 with otherwise normal vital signs.  Labs revealed albumin of 2.8 with troponin of 6.4 and otherwise unremarkable CMP.  Urinalysis was unremarkable.  Abdominal and pelvic CT scan revealed the following: 1. Findings consistent with acute sigmoid colon diverticulitis. 7.5 x 4.4 cm gas and fluid collection within the anterior pelvis suspicious for contained perforation/abscess, this appears to communicate with the thickened inflamed sigmoid colon. 2. Small amount of upper abdominal and pelvic ascites. 3. Thickened appearance of pelvic small bowel loops, likely reactive  The patient was given 1 L bolus of IV lactated Ringer, 1 p.o. Norco, half milligram of IV Dilaudid, 4 mg of IV morphine sulfate and 4 mg of IV Zofran as well as 3.375 g of IV Zosyn.  Dr. Christian Mate was consulted about the patient and he will notify IR in a.m.     04/14/2020 patient hemodynamically decline was transferred to critical care team Surgery interventional radiology critical care team was consulted  04/15/2020 -patient remained under PCCM care lactic acid continues to worsen, now 7.2 (previously 6.1).  CT Abdomen/Pelvis obtained at 22:00 earlier in the shift, which was NEGATIVE   04/16/2020 -patient remains critically ill, improving hemodynamically, status post septic shock, multiorgan failure including  renal failure -nephrology was consulted patient will remain in ICU  04/17/2020-patient was seen, still critically ill, in respiratory distress audibly wheezing, with dialysis was initiated yesterday,-resuming again this morning ID following IV antibiotics surgery following still recommending no surgery -gallbladder drain in place    ASSESSMENT AND PLAN:    Septic shock  -source diverticulosis/abscess -Status post IR placement of gallbladder drain  -Blood pressure stable, remains tachycardic heart rate 146, tachypneic with respirate of 24 temp 98.6 blood pressure 135/94 satting 94% on 3 L of oxygen  -Hemodialysis initiated yesterday for her acute renal failure  Hypotensive blood pressure is low 75/55, improved on Levophed Lactic acid 5.7, 10.9, >>>6.1 >>>1.6 -Remains lethargic, respiratory stress, audibly wheezing -Patient has refused BiPAP or intubation, confirmed DNI with family  -present on admission with hypotension, tachycardia, With multiorgan failure - acute liver and kidney failure -Levophed as need per PCCM -Source of infection is diverticular abscess status post CT-guided drainage.  Pelvic drainage catheter in stable position.  Surgery following.   Repeat CT scan shows on 8/16 no worsening. -Consult PCCM, general surgery, IR, nephrology following closely  ID following Patient was initiated on Flagyl and Rocephin >>> has been switched to meropenem -Lactic acidosis worsening - tenofovir can cause severe lactic acidosis. ->  Genvoya is stopped for now. -Abdominal x-ray not showing any free air  Acute hypoxic respiratory failure secondary to COPD exacerbation Patient refused BiPAP, supplemental oxygen as needed PCCM following -Has been tapered down on O2, currently on 3 L, satting 94%   Acute kidney failure -due to sepsis shock-hypertension  -  ATN Hemodialysis initiated on 04/16/2020   Foley catheter placed on 8/17 per surgery/PCCM Creatinine worsened from 0.8->1.28->  1.78-> 3.23 >>>>4.48 >> 4.51 Oligoanuric Nephrology -following very closely  IV fluids were discontinued, initiated Lasix  Acute liver failure -Due to above, septic, septic shock AST of 1634  >> 791  ALT 853 >>  1153 Alk phos 60, 84 >> 127 Avoid hepatotoxic and nephrotoxic medications  Acute sigmoid diverticulitis and diverticular abscess s/p CT-guided drainage by IR on 8/16 Surgery following IR consulted, status post CT-guided gallbladder drain    Morbid obesity Complicates overall care Body mass index is 43.27 kg/m.     Palliative care c/s for GOC  Status is: Inpatient  Remains inpatient appropriate because:Hemodynamically unstable and IV treatments appropriate due to intensity of illness or inability to take PO   Dispo: The patient is from: Home              Anticipated d/c is to: Home              Anticipated d/c date is: > 3 days              Patient currently is not medically stable to d/c.    DVT prophylaxis:            enoxaparin (LOVENOX) injection 30 mg Start: 04/16/20 1000     Family Communication: discussed with Daughter   All the records are reviewed and case discussed with Care Management/Social Worker. Management plans discussed with the patient, family (Daughter at bedside) and they are in agreement.  CODE STATUS: Partial Code  TOTAL TIME TAKING CARE OF THIS PATIENT: 35 minutes.   More than 50% of the time was spent in counseling/coordination of care: YES  POSSIBLE D/C IN 3-4 DAYS, DEPENDING ON CLINICAL CONDITION.      DRUG ALLERGIES:   Allergies  Allergen Reactions  . Acetaminophen Nausea And Vomiting  . Ibuprofen Other (See Comments)    Reports causes bleeding  . Gabapentin Rash  . Morphine Itching and Rash  . Morphine And Related Itching  . Zanaflex  [Tizanidine] Rash   VITALS:  Blood pressure (!) 135/94, pulse (!) 146, temperature 98.6 F (37 C), temperature source Oral, resp. rate (!) 24, height $RemoveBe'5\' 6"'XngAUBvWm$  (1.676 m), weight  121.6 kg, SpO2 94 %. PHYSICAL EXAMINATION:         Physical Exam:   General:   Awake, lethargic  HEENT:  Normocephalic, PERRL, otherwise with in Normal limits   Neuro:  CNII-XII intact. , normal motor and sensation, reflexes intact   Lungs:    In respiratory distress, increased work of breathing,  diffuse wheezing, rhonchi, negative any crackles    Cardio:    S1/S2, RRR, No murmure, No Rubs or Gallops   Abdomen:   Soft, non-tender, bowel sounds active all four quadrants,  no guarding or peritoneal signs.  Muscular skeletal:  Limited exam - in bed, able to move all 4 extremities, severe generalized weaknesses 2+ pulses,  symmetric, ++ pitting edema  Skin:  Dry, warm to touch, negative for any Rashes, No open wounds  Wounds: Please see nursing documentation   JP drain present in the left lower quadrant draining serosanguineous drainage           Foley inserted on 8/17 LABORATORY PANEL:  Female CBC Recent Labs  Lab 04/17/20 0603  WBC 22.6*  HGB 11.5*  HCT 31.5*  PLT 49*   ------------------------------------------------------------------------------------------------------------------ Chemistries  Recent Labs  Lab 04/17/20 0603  NA  136  K 4.9  CL 102  CO2 17*  GLUCOSE 110*  BUN 63*  CREATININE 4.51*  CALCIUM 6.9*  MG 1.7  AST 791*  ALT 1,153*  ALKPHOS 127*  BILITOT 0.9   RADIOLOGY:  DG Abd 1 View  Result Date: 04/14/2020 CLINICAL DATA:  Abdominal pain.  Pelvic abscess drainage today. EXAM: ABDOMEN - 1 VIEW COMPARISON:  CT abdomen pelvis from same day. FINDINGS: The bowel gas pattern is normal. No intraperitoneal free air peer no radio-opaque calculi or other significant radiographic abnormality are seen. No acute osseous abnormality. Prior lumbar fusion. IMPRESSION: 1. Negative. Electronically Signed   By: Titus Dubin M.D.   On: 04/14/2020 14:37   CT ABDOMEN PELVIS W CONTRAST  Result Date: 04/14/2020 CLINICAL DATA:  Peritonitis or perforation  suspected Patient with history of perforated diverticulitis post percutaneous drain placement today. EXAM: CT ABDOMEN AND PELVIS WITH CONTRAST TECHNIQUE: Multidetector CT imaging of the abdomen and pelvis was performed using the standard protocol following bolus administration of intravenous contrast. CONTRAST:  20mL OMNIPAQUE IOHEXOL 300 MG/ML  SOLN COMPARISON:  Two prior abdominopelvic CT earlier this day, prior to drain placement.  IMPRESSION: 1. Interval percutaneous drainage of pelvic abscess. No residual fluid collection. 2. Small amount of non organized free fluid in the pelvis, pericolic gutters, and perihepatic right upper quadrant, slight increase from pre drainage CT earlier today. No evidence of new abscess. No free air or interval perforation. 3. Heterogeneous enhancement of both kidneys, nonspecific but can be seen with pyelonephritis. 4. Small pleural effusions are new from prior exam. Adjacent compressive atelectasis. Electronically Signed   By: Keith Rake M.D.   On: 04/14/2020 22:22   DG Abd Portable 2V  Result Date: 04/15/2020 CLINICAL DATA:  Diverticulitis.  Abdominal pain and distention. EXAM: PORTABLE ABDOMEN - 2 VIEW COMPARISON:  CT 04/14/2020. FINDINGS: Pelvic drainage catheter again noted. No bowel distention. Stool noted throughout the colon. No free air. Bibasilar atelectasis. Degenerative change lumbar spine and both hips. Contrast in the bladder from prior CT. IMPRESSION: 1.  Pelvic drainage catheter in stable position. 2. No acute abdominal abnormality identified. No bowel distention or free air. 3.  Bibasilar atelectasis. Electronically Signed   By: Marcello Moores  Register   On: 04/15/2020 08:07      Deatra James M.D on 04/17/2020 at 12:47 PM  [facility]   CC: Primary care physician; Theotis Burrow, MD

## 2020-04-17 NOTE — Progress Notes (Signed)
ID Daughter at bedside  BP 90/70   Pulse (!) 107   Temp 97.9 F (36.6 C) (Axillary)   Resp (!) 27   Ht 5\' 6"  (1.676 m)   Wt 121.6 kg   SpO2 94%   BMI 43.27 kg/m     O/e Somnolent Gets agitated when I try to wake her Chest b/l rhonchi Mouth breathing  Dry tongue HS-tachycardia Abd soft Rt femoral dilaysis cath  Labs CBC Latest Ref Rng & Units 04/17/2020 04/16/2020 04/15/2020  WBC 4.0 - 10.5 K/uL 22.6(H) 15.2(H) 17.2(H)  Hemoglobin 12.0 - 15.0 g/dL 11.5(L) 11.5(L) 11.9(L)  Hematocrit 36 - 46 % 31.5(L) 35.2(L) 36.8  Platelets 150 - 400 K/uL 49(L) 94(L) 149(L)    CMP Latest Ref Rng & Units 04/17/2020 04/16/2020 04/16/2020  Glucose 70 - 99 mg/dL 110(H) 172(H) 145(H)  BUN 6 - 20 mg/dL 63(H) 66(H) 64(H)  Creatinine 0.44 - 1.00 mg/dL 4.51(H) 4.65(H) 4.48(H)  Sodium 135 - 145 mmol/L 136 135 135  Potassium 3.5 - 5.1 mmol/L 4.9 4.9 5.5(H)  Chloride 98 - 111 mmol/L 102 101 102  CO2 22 - 32 mmol/L 17(L) 14(L) 16(L)  Calcium 8.9 - 10.3 mg/dL 6.9(L) 6.7(L) 6.6(L)  Total Protein 6.5 - 8.1 g/dL 5.5(L) - 5.3(L)  Total Bilirubin 0.3 - 1.2 mg/dL 0.9 - 0.8  Alkaline Phos 38 - 126 U/L 127(H) - 84  AST 15 - 41 U/L 791(H) - 2,020(H)  ALT 0 - 44 U/L 1,153(H) - 1,556(H)    Impression/recommendation  Septic shock secondary to perforated diverticulitis and peridiverticular abscess. has JP drain inserted on 04/14/20.  repeat CT scan on 8/16 did not show any free air but with drain  culture having multiple organisms ( > 7- both aerobes and multiple anerobes ) concern that this could be stool and she may have perforation -Xray abdomen 8/17 No air On  meropenem and anidulafungin on 04/15/20  Worsening liver and kidney failure sue to sepsis- slight improvement  HIV- well controlled in jan 2021 her Vl < 20 and cd4 > 1000. She was getting [facility] at [facility] and saw her provider last in June 2021  Was On Genvoya okay to hold it for now. But I do not think that was causing the lactic acidosis.  she is  clearly septic. Also labs on admission were normal and that would not be the case if it was due to genvoya Last CD4 count is 265 with 33% on 04/14/2020.  COPD Patient is also on Solu-Medrol q6  Discussed the management with care team and her daughter

## 2020-04-17 NOTE — Progress Notes (Signed)
Patient ID: Melanie Bowen, female   DOB: Jan 24, 1961, 59 y.o.   MRN: [id number]  This NP reviewed medical records, received report from team and then visited patient at the bedside as a follow up for palliative medicine needs and emotional support.  Patient remains critically ill, currently in the ICU CRRT in progress, patient lethargic and unable to engage in conversation.  Today is day 3 of the hospitalization  Prior conversation with patient and her daughter/[name] significant for decision that patient was open to all offered and available medical interventions to prolong life up to the point of BiPAP/intubation.  Patient clearly declined intubation or BiPAP utilization, daughter supported decision.   Attempted to contact daughter without success.  PMT will continue to support holistically  Total time spent on the unit was 15 minutes   Discussed with treatment team  Greater than 50% of the time was spent in counseling and coordination of care  Wadie Lessen NP  Palliative Medicine Team Team Phone # [phone] Pager [phone]

## 2020-04-18 ENCOUNTER — Inpatient Hospital Stay: Payer: Medicare Other

## 2020-04-18 DIAGNOSIS — R6521 Severe sepsis with septic shock: Secondary | ICD-10-CM | POA: Diagnosis not present

## 2020-04-18 DIAGNOSIS — A419 Sepsis, unspecified organism: Secondary | ICD-10-CM | POA: Diagnosis not present

## 2020-04-18 DIAGNOSIS — D696 Thrombocytopenia, unspecified: Secondary | ICD-10-CM

## 2020-04-18 DIAGNOSIS — N179 Acute kidney failure, unspecified: Secondary | ICD-10-CM | POA: Diagnosis not present

## 2020-04-18 DIAGNOSIS — K5720 Diverticulitis of large intestine with perforation and abscess without bleeding: Secondary | ICD-10-CM | POA: Diagnosis not present

## 2020-04-18 LAB — COMPREHENSIVE METABOLIC PANEL
ALT: 746 U/L — ABNORMAL HIGH (ref 0–44)
AST: 247 U/L — ABNORMAL HIGH (ref 15–41)
Albumin: 2 g/dL — ABNORMAL LOW (ref 3.5–5.0)
Alkaline Phosphatase: 120 U/L (ref 38–126)
Anion gap: 15 (ref 5–15)
BUN: 63 mg/dL — ABNORMAL HIGH (ref 6–20)
CO2: 20 mmol/L — ABNORMAL LOW (ref 22–32)
Calcium: 7.3 mg/dL — ABNORMAL LOW (ref 8.9–10.3)
Chloride: 101 mmol/L (ref 98–111)
Creatinine, Ser: 4.21 mg/dL — ABNORMAL HIGH (ref 0.44–1.00)
GFR calc Af Amer: 13 mL/min — ABNORMAL LOW (ref >60)
GFR calc non Af Amer: 11 mL/min — ABNORMAL LOW (ref >60)
Glucose, Bld: 126 mg/dL — ABNORMAL HIGH (ref 70–99)
Potassium: 4.8 mmol/L (ref 3.5–5.1)
Sodium: 136 mmol/L (ref 135–145)
Total Bilirubin: 0.7 mg/dL (ref 0.3–1.2)
Total Protein: 5.5 g/dL — ABNORMAL LOW (ref 6.5–8.1)

## 2020-04-18 LAB — CBC
HCT: 32.4 % — ABNORMAL LOW (ref 36.0–46.0)
Hemoglobin: 12 g/dL (ref 12.0–15.0)
MCH: 30.9 pg (ref 26.0–34.0)
MCHC: 37 g/dL — ABNORMAL HIGH (ref 30.0–36.0)
MCV: 83.5 fL (ref 80.0–100.0)
Platelets: 31 10*3/uL — ABNORMAL LOW (ref 150–400)
RBC: 3.88 MIL/uL (ref 3.87–5.11)
RDW: 18.4 % — ABNORMAL HIGH (ref 11.5–15.5)
WBC: 24.8 10*3/uL — ABNORMAL HIGH (ref 4.0–10.5)
nRBC: 0.6 % — ABNORMAL HIGH (ref 0.0–0.2)

## 2020-04-18 LAB — MAGNESIUM: Magnesium: 1.9 mg/dL (ref 1.7–2.4)

## 2020-04-18 LAB — PHOSPHORUS: Phosphorus: 5.9 mg/dL — ABNORMAL HIGH (ref 2.5–4.6)

## 2020-04-18 MED ORDER — LEVOFLOXACIN IN D5W 750 MG/150ML IV SOLN
750.0000 mg | Freq: Once | INTRAVENOUS | Status: DC
  Filled 2020-04-18: qty 150

## 2020-04-18 MED ORDER — LORAZEPAM 2 MG/ML IJ SOLN: 2.0000 mg | Freq: Once | INTRAMUSCULAR | Status: AC

## 2020-04-18 MED ORDER — AMIODARONE IV BOLUS ONLY 150 MG/100ML
150.0000 mg | Freq: Once | INTRAVENOUS | Status: AC
  Administered 2020-04-18: 150 mg via INTRAVENOUS
  Filled 2020-04-18: qty 100

## 2020-04-18 MED ORDER — LORAZEPAM 2 MG/ML IJ SOLN
INTRAMUSCULAR | Status: AC
  Administered 2020-04-18: 2 mg via INTRAVENOUS
  Filled 2020-04-18: qty 1

## 2020-04-18 MED ORDER — LEVOFLOXACIN IN D5W 750 MG/150ML IV SOLN
750.0000 mg | Freq: Once | INTRAVENOUS | Status: AC
  Administered 2020-04-18: 750 mg via INTRAVENOUS
  Filled 2020-04-18: qty 150

## 2020-04-18 MED ORDER — LORAZEPAM 2 MG/ML IJ SOLN
2.0000 mg | Freq: Once | INTRAMUSCULAR | Status: AC
  Administered 2020-04-18: 2 mg via INTRAMUSCULAR
  Filled 2020-04-18: qty 1

## 2020-04-18 NOTE — Progress Notes (Signed)
[name], NP notified of patients elevated HR up to 150's. Acknowledged and notify cardiology.

## 2020-04-18 NOTE — Progress Notes (Signed)
[address] [place], [place] [address] Phone [phone]. Fax [phone]  Date: 04/18/2020                  Patient Name:  Melanie Bowen  MRN: [id number]  DOB: [birth year]  Age / Sex: 59 y.o., female         PCP: Revelo, Elyse Jarvis, MD                 Service Requesting Consult: IM/ Deatra James, MD                 Reason for Consult: ARF            History of Present Illness: Patient is a 59 y.o. female with medical problems of HIV, COPD, HTN, who was admitted to [facility] on 04/14/2020 for evaluation of abdominal pain.  Patient presented to the ER August 6 for abdominal pain.  There was question of diverticulitis versus possible UTI.  She was discharged on oral antibiotics but presented again to the ER for worsening abdominal pain. CT with IV contrast on August 16 showed findings consistent with acute sigmoid colon diverticulitis with gas and fluid collection suspicious for contained perforation or abscess.  CT-guided percutaneous drain was placed on August 16 by vascular interventional radiology.  40 mL of foul-smelling gray-colored fluid was removed along with gas.  Repeat CT with IV contrast same day was done for assessment which showed interval percutaneous drainage of pelvic abscess heterogeneous enhancement of both kidneys with minimal excretion for both kidneys on delayed phase imaging  Patient is still lethargic  tachycardic with HR up to 140's noted on the heart monitor, irregular  minimal UOP   Vital Signs: Blood pressure 133/76, pulse (!) 123, temperature 98 F (36.7 C), temperature source Axillary, resp. rate (!) 27, height 5\' 6"  (1.676 m), weight 121.6 kg, SpO2 97 %.   Intake/Output Summary (Last 24 hours) at 04/18/2020 1002 Last data filed at 04/18/2020 0800 Gross per 24 hour  Intake 1021.26 ml  Output 115 ml  Net 906.26 ml    Weight trends: [employer]   04/13/20 2127 04/14/20 1822  Weight: 117.9 kg 121.6 kg    Physical  Exam: General:  Critically ill-looking, lethargic,opens eyes to call  HEENT  dry oral mucous membranes  Lungs:  [place]iser O2 3L but breathing though the mouth  Heart::  irregular, tachycardic  120's to 140's on heart monitor  Abdomen:  Soft, nondistended, LLQ JP drain with minimal output  Extremities:  No edema  Neurologic:  Lethargic , did not follow commands, wakes up to voice  Skin:  Warm and dry, no acute rashes  Access: Rt Femoral   Foley:  Foley catheter in place with very minimal output       Lab results: Basic Metabolic Panel: Recent Labs  Lab 04/16/20 0348 04/16/20 0348 04/16/20 0900 04/17/20 0603 04/18/20 0442  NA 135   < > 135 136 136  K 5.5*   < > 4.9 4.9 4.8  CL 102   < > 101 102 101  CO2 16*   < > 14* 17* 20*  GLUCOSE 145*   < > 172* 110* 126*  BUN 64*   < > 66* 63* 63*  CREATININE 4.48*   < > 4.65* 4.51* 4.21*  CALCIUM 6.6*   < > 6.7* 6.9* 7.3*  MG 1.9  --   --  1.7 1.9  PHOS 8.4*  --   --  5.3* 5.9*   < > =  values in this interval not displayed.    Liver Function Tests: Recent Labs  Lab 04/18/20 0442  AST 247*  ALT 746*  ALKPHOS 120  BILITOT 0.7  PROT 5.5*  ALBUMIN 2.0*   Recent Labs  Lab 04/13/20 2140  LIPASE 25   No results for input(s): AMMONIA in the last 168 hours.  CBC: Recent Labs  Lab 04/14/20 0648 04/14/20 0813 04/14/20 1245 04/15/20 0609 04/17/20 0603 04/18/20 0442  WBC   < > 5.1 7.4   < > 22.6* 24.8*  NEUTROABS  --  4.2 6.1  --   --   --   HGB   < > 14.0 13.7   < > 11.5* 12.0  HCT   < > 42.5 41.7   < > 31.5* 32.4*  MCV   < > 92 92.3   < > 84.7 83.5  PLT   < > 333 294   < > 49* 31*   < > = values in this interval not displayed.    Cardiac Enzymes: No results for input(s): CKTOTAL, TROPONINI in the last 168 hours.  BNP: Invalid input(s): POCBNP  CBG: Recent Labs  Lab 04/14/20 1828  GLUCAP 56    Microbiology: Recent Results (from the past 720 hour(s))  SARS Coronavirus 2 by RT PCR (hospital order, performed in  [facility] hospital lab) Nasopharyngeal Nasopharyngeal Swab     Status: None   Collection Time: 04/14/20  2:17 AM   Specimen: Nasopharyngeal Swab  Result Value Ref Range Status   SARS Coronavirus 2 NEGATIVE NEGATIVE Final    Comment: (NOTE) SARS-CoV-2 target nucleic acids are NOT DETECTED.  The SARS-CoV-2 RNA is generally detectable in upper and lower respiratory specimens during the acute phase of infection. The lowest concentration of SARS-CoV-2 viral copies this assay can detect is 250 copies / mL. A negative result does not preclude SARS-CoV-2 infection and should not be used as the sole basis for treatment or other patient management decisions.  A negative result may occur with improper specimen collection / handling, submission of specimen other than nasopharyngeal swab, presence of viral mutation(s) within the areas targeted by this assay, and inadequate number of viral copies (<250 copies / mL). A negative result must be combined with clinical observations, patient history, and epidemiological information.  Fact Sheet for Patients:   StrictlyIdeas.no  Fact Sheet for Healthcare Providers: BankingDealers.co.za  This test is not yet approved or  cleared by the [place] FDA and has been authorized for detection and/or diagnosis of SARS-CoV-2 by FDA under an Emergency Use Authorization (EUA).  This EUA will remain in effect (meaning this test can be used) for the duration of the COVID-19 declaration under Section 564(b)(1) of the Act, 21 U.S.C. section 360bbb-3(b)(1), unless the authorization is terminated or revoked sooner.  Performed at [facility], [address] [place], [place] [address]   CULTURE, BLOOD (ROUTINE X 2) w Reflex to ID Panel     Status: None (Preliminary result)   Collection Time: 04/14/20  8:14 AM   Specimen: BLOOD  Result Value Ref Range Status   Specimen Description BLOOD LEFT [facility]  Final    Special Requests   Final    BOTTLES DRAWN AEROBIC AND ANAEROBIC Blood Culture adequate volume   Culture   Final    NO GROWTH 4 DAYS Performed at [facility], [address] [place],  [address]    Report Status PENDING  Incomplete  CULTURE, BLOOD (ROUTINE X 2) w  Reflex to ID Panel     Status: None (Preliminary result)   Collection Time: 04/14/20  8:14 AM   Specimen: BLOOD  Result Value Ref Range Status   Specimen Description BLOOD LEFT WRIST  Final   Special Requests   Final    BOTTLES DRAWN AEROBIC AND ANAEROBIC Blood Culture adequate volume   Culture   Final    NO GROWTH 4 DAYS Performed at [facility], [address], [place], [place] [address]    Report Status PENDING  Incomplete  Aerobic/Anaerobic Culture (surgical/deep wound)     Status: None (Preliminary result)   Collection Time: 04/14/20 11:14 AM   Specimen: Abscess  Result Value Ref Range Status   Specimen Description   Final    ABSCESS Performed at [facility], [address] [place], [place] [address]    Special Requests   Final    NONE Performed at [facility], [place], [place] [address]    Gram Stain   Final    NO WBC SEEN ABUNDANT GRAM NEGATIVE RODS FEW GRAM POSITIVE COCCI FEW GRAM POSITIVE RODS Performed at [facility], [place] [address] [place], [place] [address]    Culture   Final    CULTURE REINCUBATED FOR BETTER GROWTH MIXED ANAEROBIC FLORA PRESENT.  CALL LAB IF FURTHER IID REQUIRED.    Report Status PENDING  Incomplete  MRSA PCR Screening     Status: None   Collection Time: 04/14/20  6:19 PM   Specimen: Nasopharyngeal  Result Value Ref Range Status   MRSA by PCR NEGATIVE NEGATIVE Final    Comment:        The GeneXpert MRSA Assay (FDA approved for NASAL specimens only), is one component of a comprehensive MRSA colonization surveillance program. It is not intended to diagnose MRSA infection nor to guide or monitor  treatment for MRSA infections. Performed at [facility], [address], [place], [place] [address]   Urine Culture     Status: None   Collection Time: 04/15/20  4:28 AM   Specimen: Urine, Random  Result Value Ref Range Status   Specimen Description   Final    URINE, RANDOM Performed at [facility], [address] [place], [place] [address]    Special Requests   Final    NONE Performed at [facility], [address] [place], [place] [address]    Culture   Final    NO GROWTH Performed at [facility], [place] [address], [place],  [address]    Report Status 04/16/2020 FINAL  Final     Coagulation Studies: Recent Labs    04/16/20 0348  LABPROT 22.7*  INR 2.1*    Urinalysis: No results for input(s): COLORURINE, LABSPEC, PHURINE, GLUCOSEU, HGBUR, BILIRUBINUR, KETONESUR, PROTEINUR, UROBILINOGEN, NITRITE, LEUKOCYTESUR in the last 72 hours.  Invalid input(s): APPERANCEUR      Imaging: US RENAL  Result Date: 04/16/2020 CLINICAL DATA:  Acute renal failure. EXAM: RENAL / URINARY TRACT ULTRASOUND COMPLETE COMPARISON:  CT abdomen and pelvis 04/14/2020 FINDINGS: Right Kidney: Renal measurements: 11.5 x 5.3 x 4.9 cm = volume: 158 mL. Echogenicity within normal limits. No mass or hydronephrosis visualized. Left Kidney: Renal measurements: 11.3 x 5.5 x 6.1 cm = volume: 197 mL. Echogenicity within normal limits. No mass or hydronephrosis visualized. Bladder: Decompressed by Foley catheter. Other: None. IMPRESSION: Negative renal ultrasound. Electronically Signed   By: Logan Bores M.D.   On: 04/16/2020 13:01  Scheduled Meds: . budesonide (PULMICORT) nebulizer solution  0.5 mg Nebulization BID  . Chlorhexidine Gluconate Cloth  6 each Topical Daily  . famotidine  20 mg Oral Daily  . guaiFENesin-dextromethorphan  10 mL Oral Q8H  . levalbuterol  1.25 mg Nebulization Q8H  . methylPREDNISolone (SOLU-MEDROL) injection  40 mg Intravenous Q6H  .  mometasone-formoterol  2 puff Inhalation BID  . pantoprazole (PROTONIX) IV  40 mg Intravenous Daily  . sodium chloride flush  5 mL Intracatheter Q8H   Continuous Infusions: . sodium chloride 10 mL/hr at 04/16/20 1200  . amiodarone 60 mg/hr (04/18/20 0800)  . anidulafungin Stopped (04/18/20 0017)  . meropenem (MERREM) IV Stopped (04/17/20 2203)  . norepinephrine (LEVOPHED) Adult infusion     PRN Meds:.acetaminophen **OR** acetaminophen, ALPRAZolam, heparin NICU/SCN flush, HYDROcodone-acetaminophen, HYDROmorphone (DILAUDID) injection, ipratropium-albuterol, labetalol, magnesium hydroxide   Assessment & Plan: Pt is a 59 y.o. African American  female with HIV disease, COPD, hypertension, was admitted on 04/14/2020 with Dyspnea [R06.00] Colonic diverticular abscess [K57.20] Diverticulitis of large intestine with abscess without bleeding [K57.20] Abscess of sigmoid colon due to diverticulitis [K57.20]   #Acute kidney injury #Severe sepsis from perforated sigmoid diverticulitis with abscess status post percutaneous drainage on August 16 #Shortness of breath, likely acute pulmonary edema   -AKI is likely multifactorial due to severe sepsis and multiple IV contrast exposures on August 16 for CT abdomen.  Patient is currently oliguric,   - 1st HD on 8/18 Still oliguric with encephalopathy  - Plan for short Dialysis today for metabolic optimization    Discussed with ICU team    LOS: [place] 8/20/202110:02 AM    Note: This note was prepared with Dragon dictation. Any transcription errors are unintentional

## 2020-04-18 NOTE — Progress Notes (Signed)
[facility] NAME: Melanie Bowen    MR#:  [id number]  DATE OF BIRTH:  09/14/1960  SUBJECTIVE:  CHIEF COMPLAINT:   Chief Complaint  Patient presents with  . Abdominal Pain  Patient sleeping, mouth breather.  No new issues. Daughter at [facility] course:   Per HPI:  hypertension, diverticulosis, HIV, GERD and depression, presented with acute onset of worsening left lower quadrant abdominal pain with N/V  And constipation.  The patient started having pain Friday 9 days ago when she was seen in the ER and given p.o. Augmentin .    Upon presentation BP: 151/98 with otherwise normal vital signs.  Labs revealed albumin of 2.8 with troponin of 6.4 and otherwise unremarkable CMP.  Urinalysis was unremarkable.  Abdominal and pelvic CT scan revealed the following: 1. Findings consistent with acute sigmoid colon diverticulitis. 7.5 x 4.4 cm gas and fluid collection within the anterior pelvis suspicious for contained perforation/abscess, this appears to communicate with the thickened inflamed sigmoid colon. 2. Small amount of upper abdominal and pelvic ascites. 3. Thickened appearance of pelvic small bowel loops, likely reactive  The patient was given 1 L bolus of IV lactated Ringer, 1 p.o. Norco, half milligram of IV Dilaudid, 4 mg of IV morphine sulfate and 4 mg of IV Zofran as well as 3.375 g of IV Zosyn.  Dr. Christian Mate was consulted about the patient and he will notify IR in a.m.     04/14/2020 patient hemodynamically decline was transferred to critical care team Surgery interventional radiology critical care team was consulted  04/15/2020 -patient remained under PCCM care lactic acid continues to worsen, now 7.2 (previously 6.1).  CT Abdomen/Pelvis obtained at 22:00 earlier in the shift, which was NEGATIVE   04/16/2020 -patient remains critically ill, improving hemodynamically, status post septic shock, multiorgan failure including  renal failure -nephrology was consulted patient will remain in ICU  04/17/2020-patient was seen, still critically ill, in respiratory distress audibly wheezing, with dialysis was initiated yesterday,-resuming again this morning ID following IV antibiotics surgery following still recommending no surgery -gallbladder drain in place  8/20 / 2021 -patient was seen, more awake, status post hemodialysis once again yesterday, still tachycardic, tachypneic, satting 97% on 3 L of oxygen  ASSESSMENT AND PLAN:    Septic shock  -source diverticulosis/abscess -Status post IR placement of gallbladder drain  -Blood pressure stable, remains tachycardic heart rate 123, tachypneic with respirate of 27 temp 98.0 blood pressure 133/76, satting 97% on 3 L of oxygen  -Status post second hemodialysis yesterday 04-17-20  -Hemodialysis initiated yesterday for her acute renal failure  Hypotensive blood pressure is low 75/55, improved -off Levophed Lactic acid 5.7, 10.9, >>>6.1 >>>1.6 -Remains lethargic, more awake, continues to be in respiratory distress -Off BiPAP, on 3 L O2 by nasal cannula satting 97%  -present on admission with hypotension, tachycardia, With multiorgan failure - acute liver and kidney failure -Levophed as need per PCCM -Source of infection is diverticular abscess status post CT-guided drainage.  Pelvic drainage catheter in stable position.  Surgery following.   Repeat CT scan shows on 8/16 no worsening. -Consult PCCM, general surgery, IR, nephrology following closely  ID following Worsening leukocytosis 15.2, 22.6, >>> 24.8 today Afebrile normotensive -was on Flagyl and Rocephin >>> has been switched to Meropenem -Lactic acidosis worsening - tenofovir can cause severe lactic acidosis. ->  Genvoya is stopped for now. -Abdominal x-ray not showing any  free air  -Blood/urine cultures negative to date -Gallbladder fluid anaerobic aerobic culture growing gram-positive cocci/rods  Acute  hypoxic respiratory failure secondary to COPD exacerbation Patient refused BiPAP, supplemental oxygen as needed PCCM following -Continue to taper down, on supplemental O2 3 L satting 97% today -Tapering down steroids for PCCM   Acute kidney failure -due to sepsis shock-hypertension  - ATN Hemodialysis initiated on 8/18/202, 8/19 >>> responding well -Still oliguric  Foley catheter placed on 8/17 per surgery/PCCM Creatinine worsened from 0.8->1.28-> 1.78-> 3.23 >>>>4.48 >> 4.51 Oligoanuric Nephrology -following very closely  IV fluids were discontinued, initiated Lasix  Acute liver failure -Due to above, septic, septic shock AST of 1634  >> 791  ALT 853 >>  1153 Alk phos 60, 84 >> 127 Avoid hepatotoxic and nephrotoxic medications  Acute sigmoid diverticulitis and diverticular abscess s/p CT-guided drainage by IR on 8/16 Surgery following IR consulted, status post CT-guided gallbladder drain    Morbid obesity Complicates overall care Body mass index is 43.27 kg/m.    Metabolic encephalopathy -Mentation improving -Multifactorial due to sepsis, hypoxia,  metabolic and respiratory acidosis -We will continue to monitor, treating underlying causes   Palliative care c/s for GOC  Status is: Inpatient  Remains inpatient appropriate because:Hemodynamically unstable and IV treatments appropriate due to intensity of illness or inability to take PO   Dispo: The patient is from: Home              Anticipated d/c is to: Home              Anticipated d/c date is: > 3 days              Patient currently is not medically stable to d/c.    DVT prophylaxis:            enoxaparin (LOVENOX) injection 30 mg Start: 04/16/20 1000     Family Communication: discussed with Daughter   All the records are reviewed and case discussed with Care Management/Social Worker. Management plans discussed with the patient, family (Daughter at bedside) and they are in agreement.  CODE STATUS:  Partial Code  TOTAL TIME TAKING CARE OF THIS PATIENT: 35 minutes.   More than 50% of the time was spent in counseling/coordination of care: YES  POSSIBLE D/C IN 3-4 DAYS, DEPENDING ON CLINICAL CONDITION.      DRUG ALLERGIES:   Allergies  Allergen Reactions  . Acetaminophen Nausea And Vomiting  . Ibuprofen Other (See Comments)    Reports causes bleeding  . Gabapentin Rash  . Morphine Itching and Rash  . Morphine And Related Itching  . Zanaflex  [Tizanidine] Rash   VITALS:  Blood pressure 133/76, pulse (!) 123, temperature 98 F (36.7 C), temperature source Axillary, resp. rate (!) 27, height $RemoveBe'5\' 6"'NXwklAPLV$  (1.676 m), weight 121.6 kg, SpO2 97 %. PHYSICAL EXAMINATION:        Physical Exam:   General:  More awake   HEENT:  Normocephalic, PERRL, otherwise with in Normal limits   Neuro:  CNII-XII intact. , normal motor and sensation, reflexes intact   Lungs:   Poor air exchange on auscultation BL, Respirations labored, ++ wheezes / Bl LL crackles  Cardio:    S1/S2, RRR, No murmure, No Rubs or Gallops   Abdomen:   Soft, non-tender, bowel sounds active all four quadrants,  no guarding or peritoneal signs.  Muscular skeletal:  Limited exam - in bed, able to move all 4 extremities, Normal strength,  2+ pulses,  symmetric, No  pitting edema  Skin:  Dry, warm to touch, negative for any Rashes, No open wounds  Wounds: Please see nursing documentation  JP drain present in the left lower quadrant draining serosanguineous drainage           Foley inserted on 8/17 LABORATORY PANEL:  Female CBC Recent Labs  Lab 04/18/20 0442  WBC 24.8*  HGB 12.0  HCT 32.4*  PLT 31*   ------------------------------------------------------------------------------------------------------------------ Chemistries  Recent Labs  Lab 04/18/20 0442  NA 136  K 4.8  CL 101  CO2 20*  GLUCOSE 126*  BUN 63*  CREATININE 4.21*  CALCIUM 7.3*  MG 1.9  AST 247*  ALT 746*  ALKPHOS 120  BILITOT 0.7    RADIOLOGY:  DG Abd 1 View  Result Date: 04/14/2020 CLINICAL DATA:  Abdominal pain.  Pelvic abscess drainage today. EXAM: ABDOMEN - 1 VIEW COMPARISON:  CT abdomen pelvis from same day. FINDINGS: The bowel gas pattern is normal. No intraperitoneal free air peer no radio-opaque calculi or other significant radiographic abnormality are seen. No acute osseous abnormality. Prior lumbar fusion. IMPRESSION: 1. Negative. Electronically Signed   By: Titus Dubin M.D.   On: 04/14/2020 14:37   CT ABDOMEN PELVIS W CONTRAST  Result Date: 04/14/2020 CLINICAL DATA:  Peritonitis or perforation suspected Patient with history of perforated diverticulitis post percutaneous drain placement today. EXAM: CT ABDOMEN AND PELVIS WITH CONTRAST TECHNIQUE: Multidetector CT imaging of the abdomen and pelvis was performed using the standard protocol following bolus administration of intravenous contrast. CONTRAST:  52mL OMNIPAQUE IOHEXOL 300 MG/ML  SOLN COMPARISON:  Two prior abdominopelvic CT earlier this day, prior to drain placement.  IMPRESSION: 1. Interval percutaneous drainage of pelvic abscess. No residual fluid collection. 2. Small amount of non organized free fluid in the pelvis, pericolic gutters, and perihepatic right upper quadrant, slight increase from pre drainage CT earlier today. No evidence of new abscess. No free air or interval perforation. 3. Heterogeneous enhancement of both kidneys, nonspecific but can be seen with pyelonephritis. 4. Small pleural effusions are new from prior exam. Adjacent compressive atelectasis. Electronically Signed   By: Keith Rake M.D.   On: 04/14/2020 22:22   DG Abd Portable 2V  Result Date: 04/15/2020 CLINICAL DATA:  Diverticulitis.  Abdominal pain and distention. EXAM: PORTABLE ABDOMEN - 2 VIEW COMPARISON:  CT 04/14/2020. FINDINGS: Pelvic drainage catheter again noted. No bowel distention. Stool noted throughout the colon. No free air. Bibasilar atelectasis.  Degenerative change lumbar spine and both hips. Contrast in the bladder from prior CT. IMPRESSION: 1.  Pelvic drainage catheter in stable position. 2. No acute abdominal abnormality identified. No bowel distention or free air. 3.  Bibasilar atelectasis. Electronically Signed   By: Marcello Moores  Register   On: 04/15/2020 08:07      Deatra James M.D on 04/18/2020 at 11:26 AM  [facility]   CC: Primary care physician; Theotis Burrow, MD

## 2020-04-18 NOTE — Consult Note (Signed)
Cardiology Consultation Note    Patient ID: Melanie Bowen, MRN: [id number], DOB/AGE: 12-12-1960 59 y.o. Admit date: 04/14/2020   Date of Consult: 04/18/2020 Primary Physician: Theotis Burrow, MD Primary Cardiologist: none  Chief Complaint: sepsis Reason for Consultation: afib with rvr Requesting MD: Dr. Mortimer Fries  HPI: Melanie Bowen is a 59 y.o. female with history of COPD/asthma, hypertension, diverticulosis, HIV infection, gastroesophageal reflux disease who presented to emergency room with worsening left lower quadrant abdominal pain with nausea and vomiting.  She had had discomfort previously and was treated with Augmentin initially.  When she presented to the emergency room on August 16 of this year she had a blood pressure 151/98 with a normal high-sensitivity troponin with abdominal pelvic CT is scan showing acute sigmoid colon diverticulitis with pelvic abscess.  She underwent CT-guided abscess drainage.  She was treated with morphine as well as empiric antibiotics.  As an outpatient she is treated with amlodipine 10 mg daily, Genvoya, albuterol, hydrochlorothiazide, losartan.  EKG shows sinus tachycardia with no ischemia.  She was tachypneic and tachycardic.  She was transferred to the ICU with worsening lactic acidosis and worsening sepsis.  She did develop worsening renal failure and dialysis was initiated on 04/17/2020.  She remains tachycardic and tachypneic.  She has a gallbladder drain in place.  She remains on Flagyl and meropenem and she was taken off the [place].  She remains on dialysis and also has developed acute liver failure with elevated transaminases.She currently has developed afib with rvr. This did not respond to iv cardizem and she was bolused and is currently on iv amiodarone. Her rates remain in the 140-170 range. She is somewhat agitated and has family member at the bed side and another on the phone. She is hemodynaically stable.  Past Medical History:   Diagnosis Date  . Acute GI hemorrhage 12/2015   required transfusion  . Allergic rhinitis   . Arthritis    DDD  . Asthma   . Blepharitis   . Bronchitis   . Cigarette smoker   . Claustrophobia   . COPD (chronic obstructive pulmonary disease) ([place])   . Cough   . DDD (degenerative disc disease), lumbar 04/10/2014  . Depression   . Diverticulosis   . Ectopic pregnancy   . Eye irritation   . Genital herpes   . GERD (gastroesophageal reflux disease)   . Headache   . HIV (human immunodeficiency virus infection) ([place])   . Hypertension   . Paresthesia of hand, bilateral   . Shortness of breath dyspnea    at times due to asthma      Surgical History:  Past Surgical History:  Procedure Laterality Date  . BACK SURGERY  12/2014   lumbar lam. Dr. Deri Fuelling  . BACK SURGERY  12/25/2012   Removal lumbosubarachnoid shunt system, L5-S1 TF ESI, Dr. Virl Axe Minchew  . back surgery may 2016     lumbar   . BUNIONECTOMY Bilateral    feet  . COLONOSCOPY  12/06/2014   rescheduled from 11/01/2014 due to poor prep  . DIAGNOSTIC LAPAROSCOPY    . DISTAL FEMUR OSTEOTOMY Right    Dr. Earnestine Leys  . ESOPHAGOGASTRODUODENOSCOPY Left 01/11/2016   Procedure: ESOPHAGOGASTRODUODENOSCOPY (EGD);  Surgeon: Hulen Luster, MD;  Location: [facility];  Service: Endoscopy;  Laterality: Left;  . Finger fracture Right    5th finger  . FRACTURE SURGERY Right 01/25/2014   closed reduction & percutaneous pinning of 5th digit  .  HAND SURGERY Bilateral    carpal tunnel  . JOINT REPLACEMENT Bilateral    knees  . KNEE SURGERY Bilateral 2003,2007   total Joint  . LAPAROSCOPY FOR ECTOPIC PREGNANCY    . PERIPHERAL VASCULAR CATHETERIZATION N/A 01/12/2016   Procedure: Visceral Angiography;  Surgeon: Algernon Huxley, MD;  Location: [facility];  Service: Cardiovascular;  Laterality: N/A;  . PERIPHERAL VASCULAR CATHETERIZATION N/A 01/12/2016   Procedure: Visceral Artery Intervention;  Surgeon: Algernon Huxley, MD;   Location: [facility] [facility];  Service: Cardiovascular;  Laterality: N/A;  . REVISION TOTAL KNEE ARTHROPLASTY Right 12/31/2002   Dr. Marry Guan  . TUBAL LIGATION    . VIDEO BRONCHOSCOPY N/A 06/30/2015   Procedure: VIDEO BRONCHOSCOPY WITHOUT FLUORO;  Surgeon: Vilinda Boehringer, MD;  Location: [facility] ORS;  Service: Cardiopulmonary;  Laterality: N/A;     Home Meds: Prior to Admission medications   Medication Sig Start Date End Date Taking? Authorizing Provider  amLODipine (NORVASC) 10 MG tablet Take 10 mg by mouth daily.    Yes [provider]  DEXILANT 60 MG capsule Take 60 mg by mouth daily. 04/25/19  Yes [provider]  gabapentin (NEURONTIN) 600 MG tablet Take 600 mg by mouth 3 (three) times daily. 04/02/20  Yes [provider]  hydrochlorothiazide (HYDRODIURIL) 25 MG tablet Take 25 mg by mouth daily.   Yes [provider]  HYDROcodone-acetaminophen (NORCO/VICODIN) 5-325 MG tablet Take 1 tablet by mouth every 6 (six) hours as needed for severe pain. 04/04/20  Yes Lavonia Drafts, MD  losartan (COZAAR) 50 MG tablet Take 50 mg by mouth daily.   Yes [provider]  methocarbamol (ROBAXIN) 500 MG tablet Take 500 mg by mouth in the morning, at noon, in the evening, and at bedtime. 03/31/20  Yes [provider]  Grant Ruts INHUB 250-50 MCG/DOSE AEPB Inhale 1 puff into the lungs 2 (two) times daily. 11/05/19  Yes [provider]  albuterol (VENTOLIN HFA) 108 (90 Base) MCG/ACT inhaler Inhale 2 puffs into the lungs every 6 (six) hours as needed for wheezing or shortness of breath. 05/21/19   Laban Emperor, PA-C  elvitegravir-cobicistat-emtricitabine-tenofovir (GENVOYA) 150-150-200-10 MG TABS tablet Take 1 tablet by mouth at bedtime.    [provider]  famotidine (PEPCID) 20 MG tablet Take 20 mg by mouth daily.  05/08/19   [provider]  traMADol (ULTRAM) 50 MG tablet Take 50 mg by mouth every 6 (six) hours as needed. 04/03/20   [provider]  valACYclovir (VALTREX) 1000 MG tablet Take 1,000 mg by mouth at bedtime.  04/25/19   [provider]    Inpatient Medications:  . budesonide (PULMICORT) nebulizer solution  0.5 mg Nebulization BID  . Chlorhexidine Gluconate Cloth  6 each Topical Daily  . famotidine  20 mg Oral Daily  . guaiFENesin-dextromethorphan  10 mL Oral Q8H  . levalbuterol  1.25 mg Nebulization Q8H  . methylPREDNISolone (SOLU-MEDROL) injection  40 mg Intravenous Q6H  . mometasone-formoterol  2 puff Inhalation BID  . pantoprazole (PROTONIX) IV  40 mg Intravenous Daily  . sodium chloride flush  5 mL Intracatheter Q8H   . sodium chloride 10 mL/hr at 04/16/20 1200  . amiodarone 60 mg/hr (04/18/20 1400)  . anidulafungin Stopped (04/18/20 0017)  . meropenem (MERREM) IV Stopped (04/18/20 1221)  . norepinephrine (LEVOPHED) Adult infusion      Allergies:  Allergies  Allergen Reactions  . Acetaminophen Nausea And Vomiting  . Ibuprofen Other (See Comments)  Reports causes bleeding  . Gabapentin Rash  . Morphine Itching and Rash  . Morphine And Related Itching  . Zanaflex  [Tizanidine] Rash    Social History   Socioeconomic History  . Marital status: Single    Spouse name: Not on file  . Number of children: Not on file  . Years of education: Not on file  . Highest education level: Not on file  Occupational History  . Not on file  Tobacco Use  . Smoking status: Current Some Day Smoker    Packs/day: 0.25    Years: 38.00    Pack years: 9.50    Types: Cigarettes  . Smokeless tobacco: Never Used  . Tobacco comment: 1 cig daily  Vaping Use  . Vaping Use: Never used  Substance and Sexual Activity  . Alcohol use: Yes    Alcohol/week: 5.0 standard drinks    Types: 5 Glasses of wine per week    Comment: occ  . Drug use: Not Currently  . Sexual activity: Not on file  Other Topics Concern  . Not on file  Social History Narrative  . Not on file   Social Determinants of Health    Financial Resource Strain:   . Difficulty of Paying Living Expenses: Not on file  Food Insecurity:   . Worried About [name] fundraiser in the Last Year: Not on file  . Ran Out of Food in the Last Year: Not on file  Transportation Needs:   . Lack of Transportation (Medical): Not on file  . Lack of Transportation (Non-Medical): Not on file  Physical Activity:   . Days of Exercise per Week: Not on file  . Minutes of Exercise per Session: Not on file  Stress:   . Feeling of Stress : Not on file  Social Connections:   . Frequency of Communication with Friends and Family: Not on file  . Frequency of Social Gatherings with Friends and Family: Not on file  . Attends Religious Services: Not on file  . Active Member of Clubs or Organizations: Not on file  . Attends Archivist Meetings: Not on file  . Marital Status: Not on file  Intimate Partner Violence:   . Fear of Current or Ex-Partner: Not on file  . Emotionally Abused: Not on file  . Physically Abused: Not on file  . Sexually Abused: Not on file     Family History  Problem Relation Age of Onset  . Breast cancer Sister      Review of Systems: A 12-system review of systems was performed and is negative except as noted in the HPI.  Labs: No results for input(s): CKTOTAL, CKMB, TROPONINI in the last 72 hours. Lab Results  Component Value Date   WBC 24.8 (H) 04/18/2020   HGB 12.0 04/18/2020   HCT 32.4 (L) 04/18/2020   MCV 83.5 04/18/2020   PLT 31 (L) 04/18/2020    Recent Labs  Lab 04/18/20 0442  NA 136  K 4.8  CL 101  CO2 20*  BUN 63*  CREATININE 4.21*  CALCIUM 7.3*  PROT 5.5*  BILITOT 0.7  ALKPHOS 120  ALT 746*  AST 247*  GLUCOSE 126*   No results found for: CHOL, HDL, LDLCALC, TRIG No results found for: DDIMER  Radiology/Studies:  CT ABDOMEN PELVIS WO CONTRAST  Result Date: 04/14/2020 CLINICAL DATA:  Abdominal pain. Contained perforated acute diverticulitis. EXAM: CT ABDOMEN AND PELVIS  WITHOUT CONTRAST TECHNIQUE: Multidetector CT imaging of the abdomen and pelvis  was performed following the standard protocol without IV contrast. COMPARISON:  CT 04/14/2020, 01/31/2019 FINDINGS: Lower chest: Lung bases are clear. Hepatobiliary: No focal hepatic lesion. No biliary duct dilatation. Common bile duct is normal. Pancreas: Pancreas is normal. No ductal dilatation. No pancreatic inflammation. Spleen: Normal spleen Adrenals/urinary tract: Adrenal glands and kidneys are normal. The ureters and bladder normal. Stomach/Bowel: Stomach duodenum normal. Mildly thickened loops small bowel in the pelvis is favored secondary inflammation related to pelvic abscess. Appendix is partially imaged (image 61/2) appears normal. The ascending and transverse colon normal. There are diverticula descending colon and sigmoid colon. Diverticular disease is heavy in the proximal sigmoid colon. There is a thick-walled fluid collection with the an air-fluid level measuring 8.1 x 4.7 cm in the central anterior upper pelvis. This is adjacent to the sigmoid colon. There is a thin tract between the colon in the fluid collection on image 71/2. Similar pericolonic abscess seen on CT 01/17/2019. Findings most consistent with contained sigmoid perforation with abscess formation. No intraperitoneal free.  Minimal fluid in the pelvis. Vascular/Lymphatic: Abdominal aorta is normal caliber with atherosclerotic calcification. There is no retroperitoneal or periportal lymphadenopathy. No pelvic lymphadenopathy. Reproductive: Post hysterectomy.  Adnexa unremarkable Other: Small volume free fluid the pelvis Musculoskeletal: Posterior lumbar fusion IMPRESSION: 1. No significant interval change in short interval follow-up. 2. Contained sigmoid colon perforation with abscess formation. Perforation presumed related to diverticulitis. Findings similar to May 2020 with increased volume of contained perforation. 3. No intraperitoneal free air. 4.  Secondary inflammation of the small bowel. Electronically Signed   By: Suzy Bouchard M.D.   On: 04/14/2020 08:08   DG Chest 1 View  Result Date: 04/14/2020 CLINICAL DATA:  Dyspnea EXAM: CHEST  1 VIEW COMPARISON:  11/06/2019 FINDINGS: Mildly low lung volumes. There is no edema, consolidation, effusion, or pneumothorax. Normal heart size and mediastinal contours. Artifact from EKG leads IMPRESSION: No evidence of acute disease. Electronically Signed   By: Monte Fantasia M.D.   On: 04/14/2020 07:17   DG Abd 1 View  Result Date: 04/14/2020 CLINICAL DATA:  Abdominal pain.  Pelvic abscess drainage today. EXAM: ABDOMEN - 1 VIEW COMPARISON:  CT abdomen pelvis from same day. FINDINGS: The bowel gas pattern is normal. No intraperitoneal free air peer no radio-opaque calculi or other significant radiographic abnormality are seen. No acute osseous abnormality. Prior lumbar fusion. IMPRESSION: 1. Negative. Electronically Signed   By: Titus Dubin M.D.   On: 04/14/2020 14:37   CT ABDOMEN PELVIS W CONTRAST  Result Date: 04/14/2020 CLINICAL DATA:  Peritonitis or perforation suspected Patient with history of perforated diverticulitis post percutaneous drain placement today. EXAM: CT ABDOMEN AND PELVIS WITH CONTRAST TECHNIQUE: Multidetector CT imaging of the abdomen and pelvis was performed using the standard protocol following bolus administration of intravenous contrast. CONTRAST:  58mL OMNIPAQUE IOHEXOL 300 MG/ML  SOLN COMPARISON:  Two prior abdominopelvic CT earlier this day, prior to drain placement. FINDINGS: Lower chest: Small pleural effusions are new from prior exam. Adjacent compressive atelectasis. Breathing motion artifact in the lung bases limits assessment. Hepatobiliary: Small amount of perihepatic fluid slightly increased from prior exam. This appears mildly complex. High-density contents in the urinary bladder increasing likely vicarious excretion of IV contrast. There is no discrete focal hepatic  abnormality allowing for motion. Pancreas: Motion artifact through the pancreas. No obvious inflammation. Spleen: Normal allowing for motion. Adrenals/Urinary Tract: No adrenal nodule. There is heterogeneous enhancement of both kidneys. No significant perinephric edema. Minimal excretion from both kidneys on  delayed phase imaging. There is excreted IV contrast within the urinary bladder. Stomach/Bowel: New surgical drain within the pelvic fluid collection. No definite residual fluid. A drain is closely approximated to fecalized loops of small bowel in the pelvis. Motion artifact limits detailed bowel assessment. Similar appearance of scattered small bowel thickening from prior exam which is likely reactive. There is no evidence of obstruction. No bowel pneumatosis. Diverticulosis involving the colon. No new colonic inflammation or diverticulitis. Air-fluid level in the stomach with small amount of fluid in the distal esophagus. Vascular/Lymphatic: Aortic atherosclerosis. No aortic aneurysm. No evidence of portal venous or mesenteric gas. No bulky adenopathy. Reproductive: Uterus and bilateral adnexa are unremarkable. Other: Surgical drain within the pelvic fluid collection which appears completely decompressed. No definite residual collection. Generalized fat stranding involving the lower small bowel mesentery. Small amount of non organized free fluid in the dependent pelvis, tracking into the pericolic gutters and right upper quadrant. There is no definite free air. Musculoskeletal: Stable. Postsurgical change in the lower lumbar spine. IMPRESSION: 1. Interval percutaneous drainage of pelvic abscess. No residual fluid collection. 2. Small amount of non organized free fluid in the pelvis, pericolic gutters, and perihepatic right upper quadrant, slight increase from pre drainage CT earlier today. No evidence of new abscess. No free air or interval perforation. 3. Heterogeneous enhancement of both kidneys, nonspecific  but can be seen with pyelonephritis. 4. Small pleural effusions are new from prior exam. Adjacent compressive atelectasis. Electronically Signed   By: Keith Rake M.D.   On: 04/14/2020 22:22   CT Abdomen Pelvis W Contrast  Result Date: 04/14/2020 CLINICAL DATA:  Lower abdominal pain EXAM: CT ABDOMEN AND PELVIS WITH CONTRAST TECHNIQUE: Multidetector CT imaging of the abdomen and pelvis was performed using the standard protocol following bolus administration of intravenous contrast. CONTRAST:  195mL OMNIPAQUE IOHEXOL 300 MG/ML  SOLN COMPARISON:  01/31/2019 FINDINGS: Lower chest: Lung bases demonstrate no acute consolidation or pleural effusion. Normal cardiac size. Hepatobiliary: No focal liver abnormality is seen. No gallstones, gallbladder wall thickening, or biliary dilatation. Pancreas: Unremarkable. No pancreatic ductal dilatation or surrounding inflammatory changes. Spleen: Normal in size without focal abnormality. Adrenals/Urinary Tract: Adrenal glands are unremarkable. Kidneys are normal, without renal calculi, focal lesion, or hydronephrosis. Bladder is unremarkable. Stomach/Bowel: The stomach is nonenlarged. No dilated small bowel. Mildly thickened loops of small bowel within the pelvis, likely reactive. Negative appendix. Sigmoid colon diverticular disease. Wall thickening and mild inflammatory change at the distal sigmoid colon consistent with diverticulitis. 7.5 x 4.4 cm gas and fluid collection within the anterior pelvis suspicious for contained perforation/abscess. Soft tissue inflammatory density extends from inflamed sigmoid colon to the gas and fluid collection, sagittal series 6, image number 96. Vascular/Lymphatic: Moderate aortic atherosclerosis. No aneurysm. No suspicious nodes. Reproductive: Uterus and bilateral adnexa are unremarkable. Other: No free air. Small free fluid in the pelvis. Small amount of upper abdominal ascites. Generalized soft tissue stranding within the pelvis  consistent with inflammatory process. Musculoskeletal: Postsurgical changes of the lumbar spine with surgical rod and fixating screws L3 through L5. No acute osseous abnormality IMPRESSION: 1. Findings consistent with acute sigmoid colon diverticulitis. 7.5 x 4.4 cm gas and fluid collection within the anterior pelvis suspicious for contained perforation/abscess, this appears to communicate with the thickened inflamed sigmoid colon. 2. Small amount of upper abdominal and pelvic ascites. 3. Thickened appearance of pelvic small bowel loops, likely reactive Aortic Atherosclerosis (ICD10-I70.0). Electronically Signed   By: Donavan Foil M.D.   On: 04/14/2020 01:27  US RENAL  Result Date: 04/16/2020 CLINICAL DATA:  Acute renal failure. EXAM: RENAL / URINARY TRACT ULTRASOUND COMPLETE COMPARISON:  CT abdomen and pelvis 04/14/2020 FINDINGS: Right Kidney: Renal measurements: 11.5 x 5.3 x 4.9 cm = volume: 158 mL. Echogenicity within normal limits. No mass or hydronephrosis visualized. Left Kidney: Renal measurements: 11.3 x 5.5 x 6.1 cm = volume: 197 mL. Echogenicity within normal limits. No mass or hydronephrosis visualized. Bladder: Decompressed by Foley catheter. Other: None. IMPRESSION: Negative renal ultrasound. Electronically Signed   By: Logan Bores M.D.   On: 04/16/2020 13:01   MR SHOULDER RIGHT WO CONTRAST  Result Date: 04/01/2020 CLINICAL DATA:  Right shoulder pain and limited range of motion. No specific injury. EXAM: MRI OF THE RIGHT SHOULDER WITHOUT CONTRAST TECHNIQUE: Multiplanar, multisequence MR imaging of the shoulder was performed. No intravenous contrast was administered. COMPARISON:  None. FINDINGS: Examination limited by patient motion. Rotator cuff: Full-thickness retracted tear of the supraspinatus tendon is noted. This involves the mid and posterior fibers. The tear is in the critical zone region. Maximum retraction is estimated at 22 mm and the tear is 15.5 mm wide. The anterior fibers are  still intact and the subscapularis tendon is intact. The infraspinatus tendon is intact. Tendinopathy noted with interstitial tears. Muscles:  No significant findings. Biceps long head:  Intact. Acromioclavicular Joint: Moderate degenerative changes. Type 2 acromion. No significant lateral downsloping or undersurface spurring. Glenohumeral Joint: Moderate to advanced degenerative changes with areas of full or near full-thickness cartilage loss, joint space narrowing, spurring and subchondral cystic change. Labrum:  Labral degenerative changes without obvious tear. Bones: No acute bony findings. There appears to be a small defect in the glenoid with fluid tracking back into the subcoracoid space. Possible old fracture. Other: Expected fluid in the subacromial/subdeltoid bursa. IMPRESSION: 1. Full-thickness retracted tear of the supraspinatus tendon as described above. 2. Intact long head biceps tendon and grossly intact glenoid labrum. 3. Advanced glenohumeral joint degenerative changes. 4. Moderate AC joint degenerative changes but no other significant findings for bony impingement. Electronically Signed   By: Marijo Sanes M.D.   On: 04/01/2020 20:48   DG Abd Portable 2V  Result Date: 04/15/2020 CLINICAL DATA:  Diverticulitis.  Abdominal pain and distention. EXAM: PORTABLE ABDOMEN - 2 VIEW COMPARISON:  CT 04/14/2020. FINDINGS: Pelvic drainage catheter again noted. No bowel distention. Stool noted throughout the colon. No free air. Bibasilar atelectasis. Degenerative change lumbar spine and both hips. Contrast in the bladder from prior CT. IMPRESSION: 1.  Pelvic drainage catheter in stable position. 2. No acute abdominal abnormality identified. No bowel distention or free air. 3.  Bibasilar atelectasis. Electronically Signed   By: Marcello Moores  Register   On: 04/15/2020 08:07   CT IMAGE GUIDED DRAINAGE PERCUT CATH  PERITONEAL RETROPERIT  Result Date: 04/14/2020 INDICATION: 59 year old with air-fluid collection in  the lower abdomen / upper pelvic region. Findings are suggestive for a colonic diverticular abscess. Patient needs CT-guided drainage. EXAM: CT-GUIDED DRAINAGE OF PELVIC ABSCESS MEDICATIONS: Fentanyl 25 mcg ANESTHESIA/SEDATION: The patient was continuously monitored during the procedure by the interventional radiology nurse under my direct supervision. COMPLICATIONS: None immediate. PROCEDURE: Informed written consent was obtained from the patient after a thorough discussion of the procedural risks, benefits and alternatives. All questions were addressed. A timeout was performed prior to the initiation of the procedure. Patient was placed supine on the CT scanner. Images through the lower abdomen and pelvis were performed. Air-fluid collection in the lower abdomen/pelvic region was identified and targeted  for drainage. The anterior abdomen was prepped with chlorhexidine and sterile field was created. Skin and soft tissues were anesthetized using 1% lidocaine. A small incision was made. Using CT guidance, a 18 gauge trocar needle was directed into the air-fluid collection. Gray purulent fluid was coming out of the needle. Super stiff Amplatz wire was advanced into the collection and follow up CT images were obtained. The drain was dilated to accommodate a 12 [place] multipurpose drain. 12 French drain was placed. 40 mL gray colored purulent fluid and gas was removed from the collection. Follow up CT images were obtained. Catheter was sutured to skin and attached to a suction bulb. FINDINGS: Air-fluid collection just cephalad to the urinary bladder. Drain was successfully placed within the collection and 40 mL of purulent fluid was removed. The abscess was decompressed at the end of the procedure. IMPRESSION: CT-guided placement of a drainage catheter within the lower abdomen/pelvic abscess. Electronically Signed   By: Markus Daft M.D.   On: 04/14/2020 11:48    Wt Readings from Last 3 Encounters:  04/14/20 121.6 kg   04/04/20 121.1 kg  12/09/19 122 kg    EKG: afib with rvr  Physical Exam:acutely ill female Blood pressure 132/64, pulse 96, temperature 98.2 F (36.8 C), temperature source Axillary, resp. rate (!) 24, height 5\' 6"  (1.676 m), weight 121.6 kg, SpO2 98 %. Body mass index is 43.27 kg/m. General: somewhat agitated female Head: Normocephalic, atraumatic, sclera non-icteric, no xanthomas, nares are without discharge.  Neck: Negative for carotid bruits. JVD not elevated. Lungs: Clear bilaterally to auscultation without wheezes, rales, or rhonchi. Breathing is unlabored. Heart: tachycardic and irregular Abdomen: Soft, non-tender, non-distended with normoactive bowel sounds. No hepatomegaly. No rebound/guarding. No obvious abdominal masses. Msk:  Strength and tone appear normal for age. Extremities: No clubbing or cyanosis. No edema.  Distal pedal pulses are 2+ and equal bilaterally. Neuro: responds to commands but is a very difficult historian     Assessment and Plan  59 yo female with history of copd, hypertension HIV , admitted with sepsis and has developed elevated liver transaminases,acute renal failure requiring hd and afib with rvr.  Afib-likely secondary to sepsis, renal failure etc. Rate is poorly controlled. Driven somewhat by agitation and comorbidities. Would attempt to relax somewhat and see if this will control her rate some. If not, soul give another bolus of iv amiodarone and continue the drip at 60. Not a candidate for anticoagulation due to pelvic abcess that was just drained. Anticoagulation will need to be addressed at discharge. Did not respond to cardizem. Will sue amiodarone for now and follow hemodynaics.   Renal failure-on HD  Sepsis-appreciate id help. Continues on emperic abx.  Liver failure-elevated transaminases.   Signed, Melanie Spray MD 04/18/2020, 2:51 PM Pager: [phone]

## 2020-04-18 NOTE — Consult Note (Signed)
Pharmacy Antibiotic Note  Melanie Bowen is a 59 y.o. female admitted on 04/14/2020 with intra-abdominal infection - decompensating on Zosyn.     Pharmacy has been consulted for Meropenem dosing.  Plan: Continue meropenem 1g q12h  One dose of levofloxacin 750 mg IV for now per ID  Height: 5\' 6"  (167.6 cm) Weight: 121.6 kg (268 lb 1.3 oz) IBW/kg (Calculated) : 59.3  Temp (24hrs), Avg:98 F (36.7 C), Min:97.9 F (36.6 C), Max:98.2 F (36.8 C)  Recent Labs  Lab 04/14/20 1245 04/14/20 1245 04/14/20 1421 04/15/20 0200 04/15/20 0609 04/15/20 1337 04/15/20 1631 04/16/20 0348 04/16/20 0900 04/16/20 1607 04/17/20 0603 04/18/20 0442  WBC 7.4  --   --   --  17.2*  --   --  15.2*  --   --  22.6* 24.8*  CREATININE 1.78*   < >  --   --  3.23*  --   --  4.48* 4.65*  --  4.51* 4.21*  LATICACIDVEN 2.8*  --    < > 7.2* 10.9* 6.1* 5.7*  --   --  1.6  --   --    < > = values in this interval not displayed.    Estimated Creatinine Clearance: 19.4 mL/min (A) (by C-G formula based on SCr of 4.21 mg/dL (H)).    Allergies  Allergen Reactions  . Acetaminophen Nausea And Vomiting  . Ibuprofen Other (See Comments)    Reports causes bleeding  . Gabapentin Rash  . Morphine Itching and Rash  . Morphine And Related Itching  . Zanaflex  [Tizanidine] Rash    Antimicrobials this admission: Valacyclovir 8/17 >> Ceftriaxone/metronidazole 8/16>8/17 Meropenem 8/17 >> Anidulafungin 8/17 >>    Thank you for allowing pharmacy to be a part of this patient's care.  Tawnya Crook, PharmD Clinical Pharmacist 04/18/2020 4:58 PM

## 2020-04-18 NOTE — Progress Notes (Signed)
ID Somnolent Non verbal  But moving around in bed  Patient Vitals for the past 24 hrs:  BP Temp Temp src Pulse Resp SpO2  04/18/20 1300 121/81 -- -- 96 -- 98 %  04/18/20 1210 (!) 127/100 -- -- -- -- --  04/18/20 1200 (!) 152/126 98.2 F (36.8 C) -- (!) 134 -- 95 %  04/18/20 1100 -- -- -- (!) 118 -- 97 %  04/18/20 1000 128/80 -- -- 65 -- 97 %  04/18/20 0800 133/76 98 F (36.7 C) Axillary (!) 123 -- 97 %  04/18/20 0700 (!) 159/71 -- -- (!) 138 -- 91 %  04/18/20 0600 (!) 143/81 -- -- (!) 131 -- 95 %  04/18/20 0500 (!) 151/68 -- -- (!) 147 -- 96 %  04/18/20 0400 128/70 97.9 F (36.6 C) Axillary -- -- 98 %  04/18/20 0300 130/75 -- -- (!) 126 -- 96 %  04/18/20 0200 (!) 156/90 -- -- (!) 139 -- 99 %  04/18/20 0100 116/71 -- -- (!) 115 -- 96 %  04/18/20 0000 117/71 -- -- (!) 109 -- 96 %  04/17/20 2300 126/76 -- -- (!) 125 -- 95 %  04/17/20 2200 (!) 104/47 -- -- -- -- (!) 88 %  04/17/20 2100 (!) 120/55 -- -- -- -- 100 %  04/17/20 2000 (!) 128/95 -- -- (!) 124 -- 98 %  04/17/20 1930 -- 97.9 F (36.6 C) Axillary -- -- --  04/17/20 1800 90/70 -- -- (!) 107 (!) 27 94 %  04/17/20 1745 (!) 124/102 -- -- (!) 137 (!) 23 94 %  04/17/20 1700 (!) 138/111 -- -- 64 19 96 %  04/17/20 1600 124/90 (!) 97.4 F (36.3 C) Axillary 78 (!) 27 95 %  04/17/20 1500 (!) 137/94 -- -- (!) 145 (!) 25 93 %   O/e Somnolent Non verbal B/l air entry Few rhonchi Hss1s2 abd soft  CBC Latest Ref Rng & Units 04/18/2020 04/17/2020 04/16/2020  WBC 4.0 - 10.5 K/uL 24.8(H) 22.6(H) 15.2(H)  Hemoglobin 12.0 - 15.0 g/dL 12.0 11.5(L) 11.5(L)  Hematocrit 36 - 46 % 32.4(L) 31.5(L) 35.2(L)  Platelets 150 - 400 K/uL 31(L) 49(L) 94(L)    CMP Latest Ref Rng & Units 04/18/2020 04/17/2020 04/16/2020  Glucose 70 - 99 mg/dL 126(H) 110(H) 172(H)  BUN 6 - 20 mg/dL 63(H) 63(H) 66(H)  Creatinine 0.44 - 1.00 mg/dL 4.21(H) 4.51(H) 4.65(H)  Sodium 135 - 145 mmol/L 136 136 135  Potassium 3.5 - 5.1 mmol/L 4.8 4.9 4.9  Chloride 98 - 111  mmol/L 101 102 101  CO2 22 - 32 mmol/L 20(L) 17(L) 14(L)  Calcium 8.9 - 10.3 mg/dL 7.3(L) 6.9(L) 6.7(L)  Total Protein 6.5 - 8.1 g/dL 5.5(L) 5.5(L) -  Total Bilirubin 0.3 - 1.2 mg/dL 0.7 0.9 -  Alkaline Phos 38 - 126 U/L 120 127(H) -  AST 15 - 41 U/L 247(H) 791(H) -  ALT 0 - 44 U/L 746(H) 1,153(H) -    Micro Blood culture- 8/16 NG Abscess culture- verbal report- pseudomonas, e.coli, klebsiella and multiple anerobes Susceptibility will be available tomorrow  UC neg   Impression/recommendation  Septic shock secondary to perforated diverticulitisand peridiverticular abscess.has JP drain inserted on 04/14/20. repeat CT scan on 8/16 did not show any free air but with drain culture having multiple organisms ( >7- both aerobes and multiple anerobes ) concern that this could be stool and she may have perforation -Xray abdomen 8/17 did not show any  air On meropenem and anidulafungin since  04/15/20 Wbc going up. Because of pseudomonas, kleb and e.coli in the culture so far and suceptibility pending- may give her a dose of levaquin until sensi available tomorrow . This is because pseudomonas can have porin defect and meropenem resistance Patient is also on [id number] As wbc continues to increase will need repeat CT abdomen   Transaminitis due to shock liver much improved  AKI due to septic shock improving Thrombocytopenia worsening   HIV- well controlled in jan 2021 her Vl <20 and cd4 >1000. She was getting care at [facility] and saw her provider last in June 2021  Alphonzo Cruise okay to hold it for now. But I do not think thatwascausing the lactic acidosis. she is clearly septic. Also labs on admission were normal and that would not be the case if it was due to genvoya Last CD4 count is 265 with 33% on 04/14/2020.  COPD- on steroids  Dsicussed the management with care team ID will follow her peripherally this weekend Call if needed

## 2020-04-18 NOTE — Progress Notes (Signed)
[facility] SURGICAL PROGRESS NOTE (cpt [phone])  Hospital Day(s): 4.   Interval History: Patient seen and examined no acute events or new complaints overnight.  She has been able to relatively maintain hemodynamics aside from HR, is on 60 mg/hr amiodarone for afib with RVR, has not required vasopressors Patient still very somnolent, mouthing breathing, does not arouse much nor participate in interview or examination  Leukocytosis again trending up, now 24.8 this morning, but she has been without fevers Renal function slightly improved but sCr remains - 4.21, UO - 125 ccs; remains on HD but limited secondary to Afib RVR Persistent, but improved, hyperphosphatemia to 5.9 o/w no electrolyte derangements LFTs continue to improve JP drain remains seropurulent  Review of Systems:  Unable to reliably preform secondary to mental status   Vital signs in last 24 hours: [min-max] current  Temp:  [97 F (36.1 C)-98.6 F (37 C)] 97.9 F (36.6 C) (08/20 0400) Pulse Rate:  [31-147] 131 (08/20 0600) Resp:  [19-29] 27 (08/19 1800) BP: (90-156)/(47-111) 143/81 (08/20 0600) SpO2:  [88 %-100 %] 95 % (08/20 0600)     Height: 5\' 6"  (167.6 cm) Weight: 121.6 kg BMI (Calculated): 43.29   Intake/Output last 2 shifts:  08/19 0701 - 08/20 0700 In: 1090.5 [I.V.:790; IV Piggyback:300.5] Out: 125 [Urine:125]   Physical Exam:  Constitutional:Resting in bed, appearsmore somnolent this morning, does not arouse much to verbal stimuli HENT: normocephalic without obvious abnormality  Respiratory:On , audible inspiratory and expiratory wheezes, still tachypneic, mouth breathing, using accessory muscles Cardiovascular:Tachycardic and irregularly irregular Gastrointestinal:Abdomen is obese, remains soft,I am unable to endorse any point tenderness this morning but again patient is not able to verbalize much, no rebound/guarding/peritonitis, JP in LLQ with seropurulent drainage, this appears to  be slowing Genitourinary:Foley in place, minimal very dark urine in bag Musculoskeletal:+ 1 pitting edema to lower extremities, HD catheter in right groin   Labs:  CBC Latest Ref Rng & Units 04/18/2020 04/17/2020 04/16/2020  WBC 4.0 - 10.5 K/uL 24.8(H) 22.6(H) 15.2(H)  Hemoglobin 12.0 - 15.0 g/dL 12.0 11.5(L) 11.5(L)  Hematocrit 36 - 46 % 32.4(L) 31.5(L) 35.2(L)  Platelets 150 - 400 K/uL 31(L) 49(L) 94(L)   CMP Latest Ref Rng & Units 04/18/2020 04/17/2020 04/16/2020  Glucose 70 - 99 mg/dL 126(H) 110(H) 172(H)  BUN 6 - 20 mg/dL 63(H) 63(H) 66(H)  Creatinine 0.44 - 1.00 mg/dL 4.21(H) 4.51(H) 4.65(H)  Sodium 135 - 145 mmol/L 136 136 135  Potassium 3.5 - 5.1 mmol/L 4.8 4.9 4.9  Chloride 98 - 111 mmol/L 101 102 101  CO2 22 - 32 mmol/L 20(L) 17(L) 14(L)  Calcium 8.9 - 10.3 mg/dL 7.3(L) 6.9(L) 6.7(L)  Total Protein 6.5 - 8.1 g/dL 5.5(L) 5.5(L) -  Total Bilirubin 0.3 - 1.2 mg/dL 0.7 0.9 -  Alkaline Phos 38 - 126 U/L 120 127(H) -  AST 15 - 41 U/L 247(H) 791(H) -  ALT 0 - 44 U/L 746(H) 1,153(H) -     Imaging studies: No new pertinent imaging studies, CT Abdomen/Pelvis pending   Assessment/Plan: (ICD-10's: K62.20) 59 y.o. female with persistent atrial fibrillation and upward trending leukocytosis over the last 48 hours but otherwise hemodynamically stable without need for vasopressor support and some improvement in her MSOF, aside from significant AKI now on HD, attributable to septic shock secondary to sigmoid diverticulitis with abscess s/p percutaneous drainage on 08/16 andshe continues to be without any evidence of peritonitis/pneumoperitoneum/pneumatosis on examination or imaging,complicated by pertinent comorbidities includingmarked COPD   - She has  had an increase in her leukocytosis over the last 48 hours. As such, I will repeat a CT Abdomen/Pelvis this morning (unfortunately without contrast secondary to renal function) to re-evaluate her intra-abdominal process(es).   - Again, she  is without any evidence of peritonitis/pneumoperitoneumon examination this morning.Aside from her renal function and leukocytosis, her labs are improving slowly and I think we may be starting to catch back up to her initial septic presentation. I do think avoiding emergent surgical intervention as long as possible is in her best interest as she will likely require ventilator support post-op which goes against her DNI wishes. We have source control at the moment with JP drain and broad spectrum IV ABx and she has been able to remain hemodynamically stable (aside from Afib with RVR) without vasopressors.    - Appreciate PCCM assistance with this critical patient - Continue IV Abx;switched toMeropenem and andifungalin on 08/17; ID following--> Cultures from drain placement with Gram (-) rods, Gram (+) cooci and rods - Recommend remain NPO; we may need to start to consider TPN when feasible             - Can consider NGT if develops nausea/emesis - Monitor abdominal examination; leukocytosis; lactic acidosis, fever curve - Manage electrolyte derangement -Continue foleyto monitor UO; now on HD; appreciate nephrology assistance - Pain control prn; antiemetics prn - Maintain JP drain; record output - Further management per primary service  All of the above findings and recommendations were discussed with the patient, and all of patient's questions were answered toherexpressed satisfaction.  -- Edison Simon, PA-C [facility] 04/18/2020, 7:15 AM [phone] M-F: 7am - 4pm

## 2020-04-18 NOTE — Progress Notes (Signed)
Patient resting comforably this morning on 3L [place] Patient received bedisde Hemodialysis: 500 cc fluid removed Patient remains on Amiodarone gtt Daughter remains at bedside for several hours Patient tachycardic throughout the day, HR in the 170's this afternoon Dr. Mortimer Fries made aware & cardiology consulted Pt became increasingly agitated and restless throughout the afternoon-pulling out both PIVs One time dose 2mg  IM Ativan ordered and given with some relief Patient bathed by RN and NT Pt taken down for CT abdomen by this RN with no complications

## 2020-04-19 LAB — CBC
HCT: 31.3 % — ABNORMAL LOW (ref 36.0–46.0)
Hemoglobin: 11.5 g/dL — ABNORMAL LOW (ref 12.0–15.0)
MCH: 30.2 pg (ref 26.0–34.0)
MCHC: 36.7 g/dL — ABNORMAL HIGH (ref 30.0–36.0)
MCV: 82.2 fL (ref 80.0–100.0)
Platelets: 36 10*3/uL — ABNORMAL LOW (ref 150–400)
RBC: 3.81 MIL/uL — ABNORMAL LOW (ref 3.87–5.11)
RDW: 18.4 % — ABNORMAL HIGH (ref 11.5–15.5)
WBC: 24.7 10*3/uL — ABNORMAL HIGH (ref 4.0–10.5)
nRBC: 0.6 % — ABNORMAL HIGH (ref 0.0–0.2)

## 2020-04-19 LAB — CULTURE, BLOOD (ROUTINE X 2)
Culture: NO GROWTH
Culture: NO GROWTH
Special Requests: ADEQUATE
Special Requests: ADEQUATE

## 2020-04-19 LAB — COMPREHENSIVE METABOLIC PANEL
ALT: 419 U/L — ABNORMAL HIGH (ref 0–44)
AST: 94 U/L — ABNORMAL HIGH (ref 15–41)
Albumin: 1.9 g/dL — ABNORMAL LOW (ref 3.5–5.0)
Alkaline Phosphatase: 113 U/L (ref 38–126)
Anion gap: 18 — ABNORMAL HIGH (ref 5–15)
BUN: 61 mg/dL — ABNORMAL HIGH (ref 6–20)
CO2: 18 mmol/L — ABNORMAL LOW (ref 22–32)
Calcium: 8.2 mg/dL — ABNORMAL LOW (ref 8.9–10.3)
Chloride: 100 mmol/L (ref 98–111)
Creatinine, Ser: 3.89 mg/dL — ABNORMAL HIGH (ref 0.44–1.00)
GFR calc Af Amer: 14 mL/min — ABNORMAL LOW (ref >60)
GFR calc non Af Amer: 12 mL/min — ABNORMAL LOW (ref >60)
Glucose, Bld: 143 mg/dL — ABNORMAL HIGH (ref 70–99)
Potassium: 5.5 mmol/L — ABNORMAL HIGH (ref 3.5–5.1)
Sodium: 136 mmol/L (ref 135–145)
Total Bilirubin: 1.2 mg/dL (ref 0.3–1.2)
Total Protein: 5.2 g/dL — ABNORMAL LOW (ref 6.5–8.1)

## 2020-04-19 LAB — MAGNESIUM: Magnesium: 1.9 mg/dL (ref 1.7–2.4)

## 2020-04-19 LAB — PHOSPHORUS: Phosphorus: 6.2 mg/dL — ABNORMAL HIGH (ref 2.5–4.6)

## 2020-04-19 MED ORDER — AMIODARONE IV BOLUS ONLY 150 MG/100ML
150.0000 mg | Freq: Once | INTRAVENOUS | Status: AC
  Administered 2020-04-19: 150 mg via INTRAVENOUS
  Filled 2020-04-19: qty 100

## 2020-04-19 MED ORDER — IPRATROPIUM-ALBUTEROL 0.5-2.5 (3) MG/3ML IN SOLN
3.0000 mL | RESPIRATORY_TRACT | Status: DC
  Administered 2020-04-19 – 2020-04-22 (×14): 3 mL via RESPIRATORY_TRACT
  Filled 2020-04-19 (×14): qty 3

## 2020-04-19 NOTE — Progress Notes (Signed)
Pt flipping between a-fib and NSR. Alert this evening. Swallowing without coughing. Soft diet ordered. Pt is feeding herself this evening and tolerating diet.

## 2020-04-19 NOTE — Consult Note (Signed)
Pharmacy Antibiotic Note  ZANOVIA ROTZ is a 59 y.o. female admitted on 04/14/2020 with intra-abdominal infection - decompensating on Zosyn.   -perforated diverticulitisand peridiverticular abscess Pharmacy has been consulted for Meropenem dosing.  ID following  Plan: Continue meropenem 1g q12h,   Crcl 19.4 ml/min  One dose of levofloxacin 750 mg IV for now per ID 8/20-  (per ID note: Because of pseudomonas, kleb and e.coli in the culture so far and suceptibility pending- may give her a dose of levaquin until sensi available tomorrow . This is because pseudomonas can have porin defect and meropenem resistance)   Height: 5\' 6"  (167.6 cm) Weight: 121.6 kg (268 lb 1.3 oz) IBW/kg (Calculated) : 59.3  Temp (24hrs), Avg:98.6 F (37 C), Min:98.2 F (36.8 C), Max:98.9 F (37.2 C)  Recent Labs  Lab 04/14/20 1245 04/15/20 0200 04/15/20 0609 04/15/20 1337 04/15/20 1631 04/16/20 0348 04/16/20 0900 04/16/20 1607 04/17/20 0603 04/18/20 0442 04/19/20 0458  WBC   < >  --  17.2*  --   --  15.2*  --   --  22.6* 24.8* 24.7*  CREATININE   < >  --  3.23*  --   --  4.48* 4.65*  --  4.51* 4.21*  --   LATICACIDVEN  --  7.2* 10.9* 6.1* 5.7*  --   --  1.6  --   --   --    < > = values in this interval not displayed.    Estimated Creatinine Clearance: 19.4 mL/min (A) (by C-G formula based on SCr of 4.21 mg/dL (H)).    Allergies  Allergen Reactions  . Acetaminophen Nausea And Vomiting  . Ibuprofen Other (See Comments)    Reports causes bleeding  . Gabapentin Rash  . Morphine Itching and Rash  . Morphine And Related Itching  . Zanaflex  [Tizanidine] Rash    Antimicrobials this admission: Valacyclovir 8/17 >> Ceftriaxone/metronidazole 8/16>8/17 Zosyn 8/16 >>8/17 Meropenem 8/17 >> Anidulafungin 8/17 >> Levaquin x 1 8/20    Thank you for allowing pharmacy to be a part of this patient's care.  Noralee Space, PharmD Clinical Pharmacist 04/19/2020 9:45 AM

## 2020-04-19 NOTE — Progress Notes (Signed)
59-year-old female with multiple issues presents with acute diverticulitis with abscess status post drain placement.  Multiorgan failure with severe acidosis in a patient with HIV.  Repeated CT scan last night personally reviewed showing no evidence of new abscesses.  No evidence of any other acute intra-abdominal abnormalities other than mild diverticulitis.  No evidence of free air.  PE: Patient is lethargic.  Responsive to commands intermittently. Abd: Soft nontender no peritonitis JP with seropurulent fluid.  A/P poor prognosis.  From an abdominal perspective the source is control with antibiotics and drain.  There is no other new findings on recent CT scan.  There is no need for surgical intervention.  We will continue to follow. As noted I spent more than 35 minutes in this encounter with greater than 50% spent in coordination and counseling of her care. Family updated

## 2020-04-19 NOTE — Progress Notes (Signed)
Pt A&OX3 this AM. Xanax administered for anxiety. Pt sleeping and is now more lethargic. Unable to take PO at this time due to risk of aspiration. Passed [facility] swallow screen this AM before xanax. Per MD can have diet when safely awake enough for PO. JP drain and foley intact. Foley putting out very little dark urine. HR a-fib 130s-150s, MD aware, Amiodarone bolus administered per orders with little to no effect. Will continue to monitor.

## 2020-04-19 NOTE — Progress Notes (Addendum)
[place] [facility] [place] NAME: Melanie Bowen    MR#:  [id number]  DATE OF BIRTH:  07-09-1961  SUBJECTIVE:  CHIEF COMPLAINT:   Chief Complaint  Patient presents with  . Abdominal Pain  Patient sleeping, mouth breather.  No new issues. Daughter at [facility] course:   Per HPI:  hypertension, diverticulosis, HIV, GERD and depression, presented with acute onset of worsening left lower quadrant abdominal pain with N/V  And constipation.  The patient started having pain Friday 9 days ago when she was seen in the ER and given p.o. Augmentin .    Upon presentation BP: 151/98 with otherwise normal vital signs.  Labs revealed albumin of 2.8 with troponin of 6.4 and otherwise unremarkable CMP.  Urinalysis was unremarkable.  Abdominal and pelvic CT scan revealed the following: 1. Findings consistent with acute sigmoid colon diverticulitis. 7.5 x 4.4 cm gas and fluid collection within the anterior pelvis suspicious for contained perforation/abscess, this appears to communicate with the thickened inflamed sigmoid colon. 2. Small amount of upper abdominal and pelvic ascites. 3. Thickened appearance of pelvic small bowel loops, likely reactive  The patient was given 1 L bolus of IV lactated Ringer, 1 p.o. Norco, half milligram of IV Dilaudid, 4 mg of IV morphine sulfate and 4 mg of IV Zofran as well as 3.375 g of IV Zosyn.  Dr. Christian Mate was consulted about the patient and he will notify IR in a.m.     04/14/2020 patient hemodynamically decline was transferred to critical care team Surgery interventional radiology critical care team was consulted  04/15/2020 -patient remained under PCCM care lactic acid continues to worsen, now 7.2 (previously 6.1).  CT Abdomen/Pelvis obtained at 22:00 earlier in the shift, which was NEGATIVE   04/16/2020 -patient remains critically ill, improving hemodynamically, status post septic shock, multiorgan failure including  renal failure -nephrology was consulted patient will remain in ICU  04/17/2020-patient was seen, still critically ill, in respiratory distress audibly wheezing, with dialysis was initiated yesterday,-resuming again this morning ID following IV antibiotics surgery following still recommending no surgery -gallbladder drain in place  8/20 / 2021 -patient was seen, more awake, status post hemodialysis once again yesterday, still tachycardic, tachypneic, satting 97% on 3 L of oxygen   04/19/2020 -patient seems better she is responding to hemodialysis, still in A. fib with RVR cardiology attending to control rate with amiodarone, blood pressure remained soft -remains lethargic family present at bedside   Subjective: Patient continues to remain in ICU, and respiratory failure.  Increased work of breathing which has been continuously improving remains lethargic Due to sepsis septic shock has now developed renal failure which was initiated on hemodialysis now. Stat last hemodialysis 04/18/2020   O2 demand has improved from 6-3 to now 2 L satting 97%  Prognosis remains grim family present at bedside   ASSESSMENT AND PLAN:    Septic shock  -source diverticulosis/abscess -Slowly improving but remains lethargic O2 demand has improved, work of breathing has improved  -Status post IR placement of gallbladder drain  -Patient remains tachycardic with A. fib with RVR, on amiodarone drip... O2 demand improving down to 2 L satting 97% blood pressure remained soft -Acute renal failure due to septic shock, initiated hemodialysis 8/18, 8/19 , 8/20  -Hemodialysis initiated  04/16/2020  Patient was initially severely hypotensive, was started on Levophed which was tapered off Now since started on amiodarone and hemodialysis blood pressure remained  soft  Lactic acid 5.7, 10.9, >>>6.1 >>>1.6.Marland Kitchen  -Off BiPAP, high flow oxygen, now down to 2 L O2 by nasal cannula satting 97%  -present on admission with  hypotension, tachycardia, With multiorgan failure - acute liver and kidney failure -Levophed as need per PCCM -Source of infection is diverticular abscess status post CT-guided drainage.  Pelvic drainage catheter in stable position.  Surgery following.   Repeat CT scan shows on 8/16 no worsening. -Consult PCCM, general surgery, IR, nephrology following closely  ID following Worsening leukocytosis 15.2, 22.6, >>> 24.8 >>22.6 today Remains afebrile, mildly hypotensive -was on Flagyl and Rocephin >>> has been switched to Meropenem  -Lactic acidosis- tenofovir can cause severe lactic acidosis. ->  Genvoya is stopped for now. -Abdominal x-ray not showing any free air  -Blood/urine cultures negative to date -Gallbladder fluid anaerobic aerobic culture growing gram-positive cocci/rods  Acute hypoxic respiratory failure secondary to COPD exacerbation Patient refused BiPAP, supplemental oxygen as needed PCCM following -Continue to taper down, down from high flow oxygen to 3 L now satting 97% -Tapering down steroids for PCCM   Acute kidney failure -due to sepsis shock-hypertension  - ATN Hemodialysis initiated on 8/18/202, 8/19, 8/20 >>> responding well -Still oliguric  Foley catheter placed on 8/17 per surgery/PCCM Creatinine worsened from 0.8->1.28-> 1.78-> 3.23 >>>>4.48 >> 4.51 >> 3.89 Oligoanuric Nephrology -following very closely  IV fluids were discontinued, initiated Lasix  A. fib with RVR -Cardiology consulted,  -Amiodarone bolus and drip was initiated -Poor candidate for Cardizem did not respond also blood pressure remained soft -We will eventually need anticoagulation-we will hold due drain placement -high risk of bleeding    Acute liver failure -Due to above, septic, septic shock AST of 1634  >> 791  >> 94 ALT 853 >>  1153 >> 419 Alk phos 60, 84 >> 127 >> 113 Avoid hepatotoxic and nephrotoxic medications  Acute sigmoid diverticulitis and diverticular abscess s/p  CT-guided drainage by IR on 8/16 Surgery following IR consulted, status post CT-guided gallbladder drain    Morbid obesity Complicates overall care Body mass index is 43.27 kg/m.    Metabolic encephalopathy -Mentation improving -Multifactorial due to sepsis, hypoxia,  metabolic and respiratory acidosis -We will continue to monitor, treating underlying causes   Ethics:palliative care c/s for GOC  -remains on partial code Prognosis remain poor due to comorbidities, not progressively declining due to sepsis, septic shock with multiorgan failure..    Status is: Inpatient  Dispo: The patient is from: Home              Anticipated d/c is to: Home              Anticipated d/c date is: > 3 days              Patient currently is not medically stable to d/c.    DVT prophylaxis:            enoxaparin (LOVENOX) injection 30 mg Start: 04/16/20 1000     Family Communication: discussed with Daughter   All the records are reviewed and case discussed with Care Management/Social Worker. Management plans discussed with the patient, family (Daughter at bedside) and they are in agreement.  CODE STATUS: Partial Code  TOTAL TIME TAKING CARE OF THIS PATIENT: 55 minutes.   More than 50% of the time was spent in counseling/coordination of care: YES  POSSIBLE D/C IN 3-4 DAYS, DEPENDING ON CLINICAL CONDITION.      DRUG ALLERGIES:   Allergies  Allergen Reactions  .  Acetaminophen Nausea And Vomiting  . Ibuprofen Other (See Comments)    Reports causes bleeding  . Gabapentin Rash  . Morphine Itching and Rash  . Morphine And Related Itching  . Zanaflex  [Tizanidine] Rash   VITALS:  Blood pressure 96/67, pulse (!) 122, temperature 98.7 F (37.1 C), temperature source Axillary, resp. rate 19, height $RemoveBe'5\' 6"'XAatKkeuP$  (1.676 m), weight 121.6 kg, SpO2 97 %. PHYSICAL EXAMINATION:         Physical Exam:   General:  Alert, remains lethargic, but cooperative, in respiratory distress  visibly O2 remains by nasal cannula work of breathing has improved  HEENT:  Normocephalic, PERRL, otherwise with in Normal limits   Neuro:  CNII-XII intact. , normal motor and sensation, reflexes intact   Lungs:    Diffuse rhonchi, wheezing, with crackles at lower lobes  Reduce airflow/exchange at mid to lower lobes.. Poor respiratory efforts  Cardio:    S1/S2, RRR, No murmure, No Rubs or Gallops   Abdomen:   Soft, non-tender, bowel sounds active all four quadrants,  no guarding or peritoneal signs.  Muscular skeletal:  Limited exam - in bed, able to move all 4 extremities, 2+ pulses,  symmetric, +2 pitting edema  Skin:  Dry, warm to touch, negative for any Rashes, gallbladder drain remains in place  Wounds: Please see nursing documentation   JP drain present in the left lower quadrant draining serosanguineous drainage           Foley inserted on 8/17 LABORATORY PANEL:  Female CBC Recent Labs  Lab 04/19/20 0458  WBC 24.7*  HGB 11.5*  HCT 31.3*  PLT 36*   ------------------------------------------------------------------------------------------------------------------ Chemistries  Recent Labs  Lab 04/19/20 0848  NA 136  K 5.5*  CL 100  CO2 18*  GLUCOSE 143*  BUN 61*  CREATININE 3.89*  CALCIUM 8.2*  MG 1.9  AST 94*  ALT 419*  ALKPHOS 113  BILITOT 1.2   RADIOLOGY:  DG Abd 1 View  Result Date: 04/14/2020 CLINICAL DATA:  Abdominal pain.  Pelvic abscess drainage today. EXAM: ABDOMEN - 1 VIEW COMPARISON:  CT abdomen pelvis from same day. FINDINGS: The bowel gas pattern is normal. No intraperitoneal free air peer no radio-opaque calculi or other significant radiographic abnormality are seen. No acute osseous abnormality. Prior lumbar fusion. IMPRESSION: 1. Negative. Electronically Signed   By: Titus Dubin M.D.   On: 04/14/2020 14:37   CT ABDOMEN PELVIS W CONTRAST  Result Date: 04/14/2020 CLINICAL DATA:  Peritonitis or perforation suspected Patient with history of  perforated diverticulitis post percutaneous drain placement today. EXAM: CT ABDOMEN AND PELVIS WITH CONTRAST TECHNIQUE: Multidetector CT imaging of the abdomen and pelvis was performed using the standard protocol following bolus administration of intravenous contrast. CONTRAST:  55mL OMNIPAQUE IOHEXOL 300 MG/ML  SOLN COMPARISON:  Two prior abdominopelvic CT earlier this day, prior to drain placement.  IMPRESSION: 1. Interval percutaneous drainage of pelvic abscess. No residual fluid collection. 2. Small amount of non organized free fluid in the pelvis, pericolic gutters, and perihepatic right upper quadrant, slight increase from pre drainage CT earlier today. No evidence of new abscess. No free air or interval perforation. 3. Heterogeneous enhancement of both kidneys, nonspecific but can be seen with pyelonephritis. 4. Small pleural effusions are new from prior exam. Adjacent compressive atelectasis. Electronically Signed   By: Keith Rake M.D.   On: 04/14/2020 22:22   DG Abd Portable 2V  Result Date: 04/15/2020 CLINICAL DATA:  Diverticulitis.  Abdominal pain and distention. EXAM:  PORTABLE ABDOMEN - 2 VIEW COMPARISON:  CT 04/14/2020. FINDINGS: Pelvic drainage catheter again noted. No bowel distention. Stool noted throughout the colon. No free air. Bibasilar atelectasis. Degenerative change lumbar spine and both hips. Contrast in the bladder from prior CT. IMPRESSION: 1.  Pelvic drainage catheter in stable position. 2. No acute abdominal abnormality identified. No bowel distention or free air. 3.  Bibasilar atelectasis. Electronically Signed   By: Marcello Moores  Register   On: 04/15/2020 08:07      Deatra James M.D on 04/19/2020 at 1:24 PM  [facility]   CC: Primary care physician; Alene Mires Elyse Jarvis, MD

## 2020-04-19 NOTE — Progress Notes (Signed)
Dr. Ubaldo Glassing notified of patients HR elevated 130-150's, currently on Amiodarone drip. Acknowledged and new order received for Ativan 2mg  IV once now.

## 2020-04-19 NOTE — Progress Notes (Signed)
[address] [place], [place] [address] Phone [phone]. Fax [phone]  Date: 04/19/2020                  Patient Name:  Melanie Bowen  MRN: [id number]  DOB: 1961/05/11  Age / Sex: 59 y.o., female         PCP: Revelo, Elyse Jarvis, MD                 Service Requesting Consult: IM/ Deatra James, MD                 Reason for Consult: ARF            History of Present Illness: Patient is a 59 y.o. female with medical problems of HIV, COPD, HTN, who was admitted to [facility] on 04/14/2020 for evaluation of abdominal pain.  Patient presented to the ER August 6 for abdominal pain.  There was question of diverticulitis versus possible UTI.  She was discharged on oral antibiotics but presented again to the ER for worsening abdominal pain. CT with IV contrast on August 16 showed findings consistent with acute sigmoid colon diverticulitis with gas and fluid collection suspicious for contained perforation or abscess.  CT-guided percutaneous drain was placed on August 16 by vascular interventional radiology.  40 mL of foul-smelling gray-colored fluid was removed along with gas.  Repeat CT with IV contrast same day was done for assessment which showed interval percutaneous drainage of pelvic abscess heterogeneous enhancement of both kidneys with minimal excretion for both kidneys on delayed phase imaging  Patient appears to be more alert today Still agitated but able to answer few simple questions Mouth is dry.  Requesting ice chips   Vital Signs: Blood pressure 134/68, pulse 98, temperature 98.7 F (37.1 C), temperature source Axillary, resp. rate (!) 23, height 5\' 6"  (1.676 m), weight 121.6 kg, SpO2 93 %.   Intake/Output Summary (Last 24 hours) at 04/19/2020 0921 Last data filed at 04/19/2020 0800 Gross per 24 hour  Intake 1187.66 ml  Output 650 ml  Net 537.66 ml    Weight trends: [employer]   04/13/20 2127 04/14/20 1822  Weight: 117.9 kg 121.6 kg     Physical Exam: General:  Critically ill-looking, lethargic,opens eyes to call  HEENT  dry oral mucous membranes  Lungs:  [place] O2 3L but breathing though the mouth  Heart::  irregular, tachycardic  120's to 140's on heart monitor  Abdomen:  Soft, nondistended, LLQ JP drain with minimal output  Extremities:  No edema  Neurologic:  More alert today, able to answer few simple yes or no questions  Skin:  Warm and dry, no acute rashes  Access: Rt Femoral   Foley:  Foley catheter in place with very minimal output       Lab results: Basic Metabolic Panel: Recent Labs  Lab 04/16/20 0348 04/16/20 0348 04/16/20 0900 04/17/20 0603 04/18/20 0442  NA 135   < > 135 136 136  K 5.5*   < > 4.9 4.9 4.8  CL 102   < > 101 102 101  CO2 16*   < > 14* 17* 20*  GLUCOSE 145*   < > 172* 110* 126*  BUN 64*   < > 66* 63* 63*  CREATININE 4.48*   < > 4.65* 4.51* 4.21*  CALCIUM 6.6*   < > 6.7* 6.9* 7.3*  MG 1.9  --   --  1.7 1.9  PHOS 8.4*  --   --  5.3* 5.9*   < > = values in this interval not displayed.    Liver Function Tests: Recent Labs  Lab 04/18/20 0442  AST 247*  ALT 746*  ALKPHOS 120  BILITOT 0.7  PROT 5.5*  ALBUMIN 2.0*   Recent Labs  Lab 04/13/20 2140  LIPASE 25   No results for input(s): AMMONIA in the last 168 hours.  CBC: Recent Labs  Lab 04/14/20 0648 04/14/20 0813 04/14/20 1245 04/15/20 0609 04/18/20 0442 04/19/20 0458  WBC   < > 5.1 7.4   < > 24.8* 24.7*  NEUTROABS  --  4.2 6.1  --   --   --   HGB   < > 14.0 13.7   < > 12.0 11.5*  HCT   < > 42.5 41.7   < > 32.4* 31.3*  MCV   < > 92 92.3   < > 83.5 82.2  PLT   < > 333 294   < > 31* 36*   < > = values in this interval not displayed.    Cardiac Enzymes: No results for input(s): CKTOTAL, TROPONINI in the last 168 hours.  BNP: Invalid input(s): POCBNP  CBG: Recent Labs  Lab 04/14/20 1828  GLUCAP 70    Microbiology: Recent Results (from the past 720 hour(s))  SARS Coronavirus 2 by RT PCR (hospital  order, performed in [facility] hospital lab) Nasopharyngeal Nasopharyngeal Swab     Status: None   Collection Time: 04/14/20  2:17 AM   Specimen: Nasopharyngeal Swab  Result Value Ref Range Status   SARS Coronavirus 2 NEGATIVE NEGATIVE Final    Comment: (NOTE) SARS-CoV-2 target nucleic acids are NOT DETECTED.  The SARS-CoV-2 RNA is generally detectable in upper and lower respiratory specimens during the acute phase of infection. The lowest concentration of SARS-CoV-2 viral copies this assay can detect is 250 copies / mL. A negative result does not preclude SARS-CoV-2 infection and should not be used as the sole basis for treatment or other patient management decisions.  A negative result may occur with improper specimen collection / handling, submission of specimen other than nasopharyngeal swab, presence of viral mutation(s) within the areas targeted by this assay, and inadequate number of viral copies (<250 copies / mL). A negative result must be combined with clinical observations, patient history, and epidemiological information.  Fact Sheet for Patients:   StrictlyIdeas.no  Fact Sheet for Healthcare Providers: BankingDealers.co.za  This test is not yet approved or  cleared by the [place] FDA and has been authorized for detection and/or diagnosis of SARS-CoV-2 by FDA under an Emergency Use Authorization (EUA).  This EUA will remain in effect (meaning this test can be used) for the duration of the COVID-19 declaration under Section 564(b)(1) of the Act, 21 U.S.C. section 360bbb-3(b)(1), unless the authorization is terminated or revoked sooner.  Performed at [facility], [address] [place], [place] [address]   CULTURE, BLOOD (ROUTINE X 2) w Reflex to ID Panel     Status: None   Collection Time: 04/14/20  8:14 AM   Specimen: BLOOD  Result Value Ref Range Status   Specimen Description BLOOD LEFT [facility]  Final    Special Requests   Final    BOTTLES DRAWN AEROBIC AND ANAEROBIC Blood Culture adequate volume   Culture   Final    NO GROWTH 5 DAYS Performed at [facility], [address] [place], [place] [address]    Report Status 04/19/2020 FINAL  Final  CULTURE, BLOOD (ROUTINE X 2) w Reflex to ID Panel     Status: None   Collection Time: 04/14/20  8:14 AM   Specimen: BLOOD  Result Value Ref Range Status   Specimen Description BLOOD LEFT WRIST  Final   Special Requests   Final    BOTTLES DRAWN AEROBIC AND ANAEROBIC Blood Culture adequate volume   Culture   Final    NO GROWTH 5 DAYS Performed at [facility], [address] [place], [place] [address]    Report Status 04/19/2020 FINAL  Final  Aerobic/Anaerobic Culture (surgical/deep wound)     Status: None (Preliminary result)   Collection Time: 04/14/20 11:14 AM   Specimen: Abscess  Result Value Ref Range Status   Specimen Description   Final    ABSCESS Performed at [facility], [address], [place], [place] [address]    Special Requests   Final    NONE Performed at [facility], [place], [place] [address]    Gram Stain   Final    NO WBC SEEN ABUNDANT GRAM NEGATIVE RODS FEW GRAM POSITIVE COCCI FEW GRAM POSITIVE RODS Performed at [facility], [place] [address], [place], [place] [address]    Culture   Final    ABUNDANT GRAM NEGATIVE RODS ABUNDANT PSEUDOMONAS AERUGINOSA MODERATE ESCHERICHIA COLI MIXED ANAEROBIC FLORA PRESENT.  CALL LAB IF FURTHER IID REQUIRED.    Report Status PENDING  Incomplete  MRSA PCR Screening     Status: None   Collection Time: 04/14/20  6:19 PM   Specimen: Nasopharyngeal  Result Value Ref Range Status   MRSA by PCR NEGATIVE NEGATIVE Final    Comment:        The GeneXpert MRSA Assay (FDA approved for NASAL specimens only), is one component of a comprehensive MRSA colonization surveillance program. It is not intended to diagnose  MRSA infection nor to guide or monitor treatment for MRSA infections. Performed at [facility], [address], [place], [place] [address]   Urine Culture     Status: None   Collection Time: 04/15/20  4:28 AM   Specimen: Urine, Random  Result Value Ref Range Status   Specimen Description   Final    URINE, RANDOM Performed at [facility], [address] [place], [place] [address]    Special Requests   Final    NONE Performed at [facility], [address], [place], [place] [address]    Culture   Final    NO GROWTH Performed at [facility], [place] [address], [place],  [address]    Report Status 04/16/2020 FINAL  Final     Coagulation Studies: No results for input(s): LABPROT, INR in the last 72 hours.  Urinalysis: No results for input(s): COLORURINE, LABSPEC, PHURINE, GLUCOSEU, HGBUR, BILIRUBINUR, KETONESUR, PROTEINUR, UROBILINOGEN, NITRITE, LEUKOCYTESUR in the last 72 hours.  Invalid input(s): APPERANCEUR      Imaging: CT ABDOMEN PELVIS WO CONTRAST  Result Date: 04/18/2020 CLINICAL DATA:  59 year old female with concern for abdominal abscess or infection. EXAM: CT ABDOMEN AND PELVIS WITHOUT CONTRAST TECHNIQUE: Multidetector CT imaging of the abdomen and pelvis was performed following the standard protocol without IV contrast. COMPARISON:  CT abdomen pelvis dated 04/14/2020. FINDINGS: Evaluation of this exam is limited due to respiratory motion artifact. Lower chest: Partially visualized clusters of ground-glass opacity primarily involving the right lung base may represent areas of atelectasis but concerning for pneumonia. Clinical correlation is recommended.  No intra-abdominal free air. There is a small free fluid within the pelvis. Hepatobiliary: The liver is unremarkable. No intrahepatic biliary ductal dilatation. High attenuating content within the gallbladder, likely vicarious excretion of contrast. Pancreas: Unremarkable. No  pancreatic ductal dilatation or surrounding inflammatory changes. Spleen: Normal in size without focal abnormality. Adrenals/Urinary Tract: The adrenal glands unremarkable. Retained contrast from prior CT within the renal parenchyma consistent with delayed clearance and acute renal insufficiency. There is striated renal parenchyma concerning for pyelonephritis. Correlation with urinalysis recommended. There is no hydronephrosis. The visualized ureters appear unremarkable. The urinary bladder is decompressed around a Foley catheter. Stomach/Bowel: There is distal colonic and sigmoid diverticulosis without active inflammatory changes. Several mildly dilated small bowel loops measuring up to 3.4 cm in caliber without a transition most likely an ileus. Oral contrast traverses into the cecum. The appendix is normal. Vascular/Lymphatic: Advanced aortoiliac atherosclerotic disease. The IVC is unremarkable. No portal venous gas. There is no adenopathy. Reproductive: The uterus is grossly unremarkable. Other: Diffuse edema of the pelvic floor with small free fluid. Overall decrease in the inflammatory changes and stranding compared to the prior CT. A pigtail drainage catheter is noted within the pelvis in similar position. No drainable fluid collection identified. Musculoskeletal: There is osteopenia with degenerative changes of the spine. L3-L5 disc spacer and posterior fusion. There is disc desiccation and vacuum phenomena at L5-S1. No acute osseous pathology. IMPRESSION: 1. Partially visualized right lung base atelectasis or pneumonia. Clinical correlation is recommended. 2. Retained contrast within the renal parenchyma concerning for acute renal insufficiency. There is heterogeneous and striated nephrogram which may represent pyelonephritis. Correlation with urinalysis recommended. 3. Mild ileus.  No definite obstruction. 4. Colonic diverticulosis. 5. Overall decrease in the inflammatory changes and stranding compared to  the prior CT. A pigtail drainage catheter within the pelvis is similar position. No drainable fluid collection identified. 6. Aortic Atherosclerosis (ICD10-I70.0). Electronically Signed   By: Anner Crete M.D.   On: 04/18/2020 17:59    Scheduled Meds: . budesonide (PULMICORT) nebulizer solution  0.5 mg Nebulization BID  . Chlorhexidine Gluconate Cloth  6 each Topical Daily  . guaiFENesin-dextromethorphan  10 mL Oral Q8H  . levalbuterol  1.25 mg Nebulization Q8H  . methylPREDNISolone (SOLU-MEDROL) injection  40 mg Intravenous Q6H  . mometasone-formoterol  2 puff Inhalation BID  . pantoprazole (PROTONIX) IV  40 mg Intravenous Daily  . sodium chloride flush  5 mL Intracatheter Q8H   Continuous Infusions: . sodium chloride 10 mL/hr at 04/16/20 1200  . amiodarone 60 mg/hr (04/19/20 0800)  . anidulafungin Stopped (04/19/20 0034)  . meropenem (MERREM) IV Stopped (04/19/20 0025)  . norepinephrine (LEVOPHED) Adult infusion     PRN Meds:.acetaminophen **OR** acetaminophen, ALPRAZolam, heparin NICU/SCN flush, HYDROcodone-acetaminophen, HYDROmorphone (DILAUDID) injection, ipratropium-albuterol, labetalol, magnesium hydroxide   Assessment & Plan: Pt is a 59 y.o. African American  female with HIV disease, COPD, hypertension, was admitted on 04/14/2020 with Dyspnea [R06.00] Colonic diverticular abscess [K57.20] Diverticulitis of large intestine with abscess without bleeding [K57.20] Abscess of sigmoid colon due to diverticulitis [K57.20]   #Acute kidney injury #Severe sepsis from perforated sigmoid diverticulitis with abscess status post percutaneous drainage on August 16 #Shortness of breath, likely acute pulmonary edema   -AKI is likely ATN caused by multiple factors including severe sepsis and multiple IV contrast exposures on August 16 for CT abdomen.  Patient is currently oliguric,   - 1st HD on 8/18 Patient underwent hemodialysis on 18, 19, 20 Tolerated fair, with tachycardia towards  the  end of treatment yesterday More alert today. Electrolytes and volume status are acceptable.  No indication for dialysis today We will continue to monitor daily    Discussed with ICU team    LOS: 5 Abelina Ketron 8/21/20219:21 AM    Note: This note was prepared with Dragon dictation. Any transcription errors are unintentional

## 2020-04-19 NOTE — Progress Notes (Signed)
Will follow along peripherally Not on vent, not on pressors resp status improving HR elevated AFIB with RVR cardiology following Follow up Nephrology Recs Patient is DNI.  No further recommendations at this time, meets SD/PCU criteria.

## 2020-04-19 NOTE — Progress Notes (Signed)
Assisted tele visit to patient with daughter.  Hebah Bogosian DNP eLink RN

## 2020-04-19 NOTE — Progress Notes (Addendum)
Patient Name: Melanie Bowen Date of Encounter: 04/19/2020  Hospital Problem List     Principal Problem:   Severe sepsis with septic shock ([place]) Active Problems:   HTN (hypertension)   HIV (human immunodeficiency virus infection) ([place])   Diverticulitis of colon   Abscess of sigmoid colon due to diverticulitis   Acute renal failure (ARF) ([place])   Hypotension   Diverticulosis   Acute respiratory distress   DNR (do not resuscitate) discussion   Palliative care by specialist    Patient Profile     59 year old female with COPD, hypertension, diverticulosis, HIV, gastroesophageal reflux disease who presented with abdominal pain nausea and was noted to have a pelvic abscess.  She had a CT-guided drainage and has a drain in place.  She had atrial fibrillation rapid ventricular response which is a difficult control.  Had acute renal insufficiency as well as elevated transaminases.  Subjective   Very agitated yelling out.  Not able to give coherent history although appears to be oriented on occasion.  Inpatient Medications    . budesonide (PULMICORT) nebulizer solution  0.5 mg Nebulization BID  . Chlorhexidine Gluconate Cloth  6 each Topical Daily  . guaiFENesin-dextromethorphan  10 mL Oral Q8H  . levalbuterol  1.25 mg Nebulization Q8H  . methylPREDNISolone (SOLU-MEDROL) injection  40 mg Intravenous Q6H  . mometasone-formoterol  2 puff Inhalation BID  . pantoprazole (PROTONIX) IV  40 mg Intravenous Daily  . sodium chloride flush  5 mL Intracatheter Q8H    Vital Signs    Vitals:   04/19/20 0500 04/19/20 0600 04/19/20 0700 04/19/20 0800  BP: (!) 128/92 115/85 118/72 134/68  Pulse: (!) 153 69 (!) 132 98  Resp: (!) 24 (!) 24 (!) 26 (!) 23  Temp:    98.7 F (37.1 C)  TempSrc:    Axillary  SpO2: 95% 94% 91% 93%  Weight:      Height:        Intake/Output Summary (Last 24 hours) at 04/19/2020 0952 Last data filed at 04/19/2020 0800 Gross per 24 hour  Intake 1187.66 ml   Output 650 ml  Net 537.66 ml   Filed Weights   04/13/20 2127 04/14/20 1822  Weight: 117.9 kg 121.6 kg    Physical Exam    GEN: Well nourished, well developed, in no acute distress.  HEENT: normal.  Neck: Supple, no JVD, carotid bruits, or masses. Cardiac: Irregular irregular rhythm with tachycardia Respiratory:  Respirations regular and unlabored, clear to auscultation bilaterally. GI: Soft, nontender, nondistended, BS + x 4. MS: no deformity or atrophy. Skin: warm and dry, no rash. Neuro: Very agitated.. .  Labs    CBC Recent Labs    04/18/20 0442 04/19/20 0458  WBC 24.8* 24.7*  HGB 12.0 11.5*  HCT 32.4* 31.3*  MCV 83.5 82.2  PLT 31* 36*   Basic Metabolic Panel Recent Labs    04/17/20 0603 04/18/20 0442  NA 136 136  K 4.9 4.8  CL 102 101  CO2 17* 20*  GLUCOSE 110* 126*  BUN 63* 63*  CREATININE 4.51* 4.21*  CALCIUM 6.9* 7.3*  MG 1.7 1.9  PHOS 5.3* 5.9*   Liver Function Tests Recent Labs    04/17/20 0603 04/18/20 0442  AST 791* 247*  ALT 1,153* 746*  ALKPHOS 127* 120  BILITOT 0.9 0.7  PROT 5.5* 5.5*  ALBUMIN 2.1* 2.0*   No results for input(s): LIPASE, AMYLASE in the last 72 hours. Cardiac Enzymes No results for input(s): CKTOTAL, CKMB,  CKMBINDEX, TROPONINI in the last 72 hours. BNP No results for input(s): BNP in the last 72 hours. D-Dimer No results for input(s): DDIMER in the last 72 hours. Hemoglobin A1C No results for input(s): HGBA1C in the last 72 hours. Fasting Lipid Panel No results for input(s): CHOL, HDL, LDLCALC, TRIG, CHOLHDL, LDLDIRECT in the last 72 hours. Thyroid Function Tests No results for input(s): TSH, T4TOTAL, T3FREE, THYROIDAB in the last 72 hours.  Invalid input(s): FREET3  Telemetry    Atrial fibrillation with rapid ventricular response with transient sinus rhythm  ECG    A. fib with rapid ventricular response  Radiology    CT ABDOMEN PELVIS WO CONTRAST  Result Date: 04/18/2020 CLINICAL DATA:   59 year old female with concern for abdominal abscess or infection. EXAM: CT ABDOMEN AND PELVIS WITHOUT CONTRAST TECHNIQUE: Multidetector CT imaging of the abdomen and pelvis was performed following the standard protocol without IV contrast. COMPARISON:  CT abdomen pelvis dated 04/14/2020. FINDINGS: Evaluation of this exam is limited due to respiratory motion artifact. Lower chest: Partially visualized clusters of ground-glass opacity primarily involving the right lung base may represent areas of atelectasis but concerning for pneumonia. Clinical correlation is recommended. No intra-abdominal free air. There is a small free fluid within the pelvis. Hepatobiliary: The liver is unremarkable. No intrahepatic biliary ductal dilatation. High attenuating content within the gallbladder, likely vicarious excretion of contrast. Pancreas: Unremarkable. No pancreatic ductal dilatation or surrounding inflammatory changes. Spleen: Normal in size without focal abnormality. Adrenals/Urinary Tract: The adrenal glands unremarkable. Retained contrast from prior CT within the renal parenchyma consistent with delayed clearance and acute renal insufficiency. There is striated renal parenchyma concerning for pyelonephritis. Correlation with urinalysis recommended. There is no hydronephrosis. The visualized ureters appear unremarkable. The urinary bladder is decompressed around a Foley catheter. Stomach/Bowel: There is distal colonic and sigmoid diverticulosis without active inflammatory changes. Several mildly dilated small bowel loops measuring up to 3.4 cm in caliber without a transition most likely an ileus. Oral contrast traverses into the cecum. The appendix is normal. Vascular/Lymphatic: Advanced aortoiliac atherosclerotic disease. The IVC is unremarkable. No portal venous gas. There is no adenopathy. Reproductive: The uterus is grossly unremarkable. Other: Diffuse edema of the pelvic floor with small free fluid. Overall decrease  in the inflammatory changes and stranding compared to the prior CT. A pigtail drainage catheter is noted within the pelvis in similar position. No drainable fluid collection identified. Musculoskeletal: There is osteopenia with degenerative changes of the spine. L3-L5 disc spacer and posterior fusion. There is disc desiccation and vacuum phenomena at L5-S1. No acute osseous pathology. IMPRESSION: 1. Partially visualized right lung base atelectasis or pneumonia. Clinical correlation is recommended. 2. Retained contrast within the renal parenchyma concerning for acute renal insufficiency. There is heterogeneous and striated nephrogram which may represent pyelonephritis. Correlation with urinalysis recommended. 3. Mild ileus.  No definite obstruction. 4. Colonic diverticulosis. 5. Overall decrease in the inflammatory changes and stranding compared to the prior CT. A pigtail drainage catheter within the pelvis is similar position. No drainable fluid collection identified. 6. Aortic Atherosclerosis (ICD10-I70.0). Electronically Signed   By: Anner Crete M.D.   On: 04/18/2020 17:59   CT ABDOMEN PELVIS WO CONTRAST  Result Date: 04/14/2020 CLINICAL DATA:  Abdominal pain. Contained perforated acute diverticulitis. EXAM: CT ABDOMEN AND PELVIS WITHOUT CONTRAST TECHNIQUE: Multidetector CT imaging of the abdomen and pelvis was performed following the standard protocol without IV contrast. COMPARISON:  CT 04/14/2020, 01/31/2019 FINDINGS: Lower chest: Lung bases are clear. Hepatobiliary: No focal  hepatic lesion. No biliary duct dilatation. Common bile duct is normal. Pancreas: Pancreas is normal. No ductal dilatation. No pancreatic inflammation. Spleen: Normal spleen Adrenals/urinary tract: Adrenal glands and kidneys are normal. The ureters and bladder normal. Stomach/Bowel: Stomach duodenum normal. Mildly thickened loops small bowel in the pelvis is favored secondary inflammation related to pelvic abscess. Appendix is  partially imaged (image 61/2) appears normal. The ascending and transverse colon normal. There are diverticula descending colon and sigmoid colon. Diverticular disease is heavy in the proximal sigmoid colon. There is a thick-walled fluid collection with the an air-fluid level measuring 8.1 x 4.7 cm in the central anterior upper pelvis. This is adjacent to the sigmoid colon. There is a thin tract between the colon in the fluid collection on image 71/2. Similar pericolonic abscess seen on CT 01/17/2019. Findings most consistent with contained sigmoid perforation with abscess formation. No intraperitoneal free.  Minimal fluid in the pelvis. Vascular/Lymphatic: Abdominal aorta is normal caliber with atherosclerotic calcification. There is no retroperitoneal or periportal lymphadenopathy. No pelvic lymphadenopathy. Reproductive: Post hysterectomy.  Adnexa unremarkable Other: Small volume free fluid the pelvis Musculoskeletal: Posterior lumbar fusion IMPRESSION: 1. No significant interval change in short interval follow-up. 2. Contained sigmoid colon perforation with abscess formation. Perforation presumed related to diverticulitis. Findings similar to May 2020 with increased volume of contained perforation. 3. No intraperitoneal free air. 4. Secondary inflammation of the small bowel. Electronically Signed   By: Suzy Bouchard M.D.   On: 04/14/2020 08:08   DG Chest 1 View  Result Date: 04/14/2020 CLINICAL DATA:  Dyspnea EXAM: CHEST  1 VIEW COMPARISON:  11/06/2019 FINDINGS: Mildly low lung volumes. There is no edema, consolidation, effusion, or pneumothorax. Normal heart size and mediastinal contours. Artifact from EKG leads IMPRESSION: No evidence of acute disease. Electronically Signed   By: Monte Fantasia M.D.   On: 04/14/2020 07:17   DG Abd 1 View  Result Date: 04/14/2020 CLINICAL DATA:  Abdominal pain.  Pelvic abscess drainage today. EXAM: ABDOMEN - 1 VIEW COMPARISON:  CT abdomen pelvis from same day.  FINDINGS: The bowel gas pattern is normal. No intraperitoneal free air peer no radio-opaque calculi or other significant radiographic abnormality are seen. No acute osseous abnormality. Prior lumbar fusion. IMPRESSION: 1. Negative. Electronically Signed   By: Titus Dubin M.D.   On: 04/14/2020 14:37   CT ABDOMEN PELVIS W CONTRAST  Result Date: 04/14/2020 CLINICAL DATA:  Peritonitis or perforation suspected Patient with history of perforated diverticulitis post percutaneous drain placement today. EXAM: CT ABDOMEN AND PELVIS WITH CONTRAST TECHNIQUE: Multidetector CT imaging of the abdomen and pelvis was performed using the standard protocol following bolus administration of intravenous contrast. CONTRAST:  53mL OMNIPAQUE IOHEXOL 300 MG/ML  SOLN COMPARISON:  Two prior abdominopelvic CT earlier this day, prior to drain placement. FINDINGS: Lower chest: Small pleural effusions are new from prior exam. Adjacent compressive atelectasis. Breathing motion artifact in the lung bases limits assessment. Hepatobiliary: Small amount of perihepatic fluid slightly increased from prior exam. This appears mildly complex. High-density contents in the urinary bladder increasing likely vicarious excretion of IV contrast. There is no discrete focal hepatic abnormality allowing for motion. Pancreas: Motion artifact through the pancreas. No obvious inflammation. Spleen: Normal allowing for motion. Adrenals/Urinary Tract: No adrenal nodule. There is heterogeneous enhancement of both kidneys. No significant perinephric edema. Minimal excretion from both kidneys on delayed phase imaging. There is excreted IV contrast within the urinary bladder. Stomach/Bowel: New surgical drain within the pelvic fluid collection. No definite residual  fluid. A drain is closely approximated to fecalized loops of small bowel in the pelvis. Motion artifact limits detailed bowel assessment. Similar appearance of scattered small bowel thickening from prior  exam which is likely reactive. There is no evidence of obstruction. No bowel pneumatosis. Diverticulosis involving the colon. No new colonic inflammation or diverticulitis. Air-fluid level in the stomach with small amount of fluid in the distal esophagus. Vascular/Lymphatic: Aortic atherosclerosis. No aortic aneurysm. No evidence of portal venous or mesenteric gas. No bulky adenopathy. Reproductive: Uterus and bilateral adnexa are unremarkable. Other: Surgical drain within the pelvic fluid collection which appears completely decompressed. No definite residual collection. Generalized fat stranding involving the lower small bowel mesentery. Small amount of non organized free fluid in the dependent pelvis, tracking into the pericolic gutters and right upper quadrant. There is no definite free air. Musculoskeletal: Stable. Postsurgical change in the lower lumbar spine. IMPRESSION: 1. Interval percutaneous drainage of pelvic abscess. No residual fluid collection. 2. Small amount of non organized free fluid in the pelvis, pericolic gutters, and perihepatic right upper quadrant, slight increase from pre drainage CT earlier today. No evidence of new abscess. No free air or interval perforation. 3. Heterogeneous enhancement of both kidneys, nonspecific but can be seen with pyelonephritis. 4. Small pleural effusions are new from prior exam. Adjacent compressive atelectasis. Electronically Signed   By: Keith Rake M.D.   On: 04/14/2020 22:22   CT Abdomen Pelvis W Contrast  Result Date: 04/14/2020 CLINICAL DATA:  Lower abdominal pain EXAM: CT ABDOMEN AND PELVIS WITH CONTRAST TECHNIQUE: Multidetector CT imaging of the abdomen and pelvis was performed using the standard protocol following bolus administration of intravenous contrast. CONTRAST:  152mL OMNIPAQUE IOHEXOL 300 MG/ML  SOLN COMPARISON:  01/31/2019 FINDINGS: Lower chest: Lung bases demonstrate no acute consolidation or pleural effusion. Normal cardiac size.  Hepatobiliary: No focal liver abnormality is seen. No gallstones, gallbladder wall thickening, or biliary dilatation. Pancreas: Unremarkable. No pancreatic ductal dilatation or surrounding inflammatory changes. Spleen: Normal in size without focal abnormality. Adrenals/Urinary Tract: Adrenal glands are unremarkable. Kidneys are normal, without renal calculi, focal lesion, or hydronephrosis. Bladder is unremarkable. Stomach/Bowel: The stomach is nonenlarged. No dilated small bowel. Mildly thickened loops of small bowel within the pelvis, likely reactive. Negative appendix. Sigmoid colon diverticular disease. Wall thickening and mild inflammatory change at the distal sigmoid colon consistent with diverticulitis. 7.5 x 4.4 cm gas and fluid collection within the anterior pelvis suspicious for contained perforation/abscess. Soft tissue inflammatory density extends from inflamed sigmoid colon to the gas and fluid collection, sagittal series 6, image number 96. Vascular/Lymphatic: Moderate aortic atherosclerosis. No aneurysm. No suspicious nodes. Reproductive: Uterus and bilateral adnexa are unremarkable. Other: No free air. Small free fluid in the pelvis. Small amount of upper abdominal ascites. Generalized soft tissue stranding within the pelvis consistent with inflammatory process. Musculoskeletal: Postsurgical changes of the lumbar spine with surgical rod and fixating screws L3 through L5. No acute osseous abnormality IMPRESSION: 1. Findings consistent with acute sigmoid colon diverticulitis. 7.5 x 4.4 cm gas and fluid collection within the anterior pelvis suspicious for contained perforation/abscess, this appears to communicate with the thickened inflamed sigmoid colon. 2. Small amount of upper abdominal and pelvic ascites. 3. Thickened appearance of pelvic small bowel loops, likely reactive Aortic Atherosclerosis (ICD10-I70.0). Electronically Signed   By: Donavan Foil M.D.   On: 04/14/2020 01:27   US  RENAL  Result Date: 04/16/2020 CLINICAL DATA:  Acute renal failure. EXAM: RENAL / URINARY TRACT ULTRASOUND COMPLETE COMPARISON:  CT abdomen and pelvis 04/14/2020 FINDINGS: Right Kidney: Renal measurements: 11.5 x 5.3 x 4.9 cm = volume: 158 mL. Echogenicity within normal limits. No mass or hydronephrosis visualized. Left Kidney: Renal measurements: 11.3 x 5.5 x 6.1 cm = volume: 197 mL. Echogenicity within normal limits. No mass or hydronephrosis visualized. Bladder: Decompressed by Foley catheter. Other: None. IMPRESSION: Negative renal ultrasound. Electronically Signed   By: Logan Bores M.D.   On: 04/16/2020 13:01   MR SHOULDER RIGHT WO CONTRAST  Result Date: 04/01/2020 CLINICAL DATA:  Right shoulder pain and limited range of motion. No specific injury. EXAM: MRI OF THE RIGHT SHOULDER WITHOUT CONTRAST TECHNIQUE: Multiplanar, multisequence MR imaging of the shoulder was performed. No intravenous contrast was administered. COMPARISON:  None. FINDINGS: Examination limited by patient motion. Rotator cuff: Full-thickness retracted tear of the supraspinatus tendon is noted. This involves the mid and posterior fibers. The tear is in the critical zone region. Maximum retraction is estimated at 22 mm and the tear is 15.5 mm wide. The anterior fibers are still intact and the subscapularis tendon is intact. The infraspinatus tendon is intact. Tendinopathy noted with interstitial tears. Muscles:  No significant findings. Biceps long head:  Intact. Acromioclavicular Joint: Moderate degenerative changes. Type 2 acromion. No significant lateral downsloping or undersurface spurring. Glenohumeral Joint: Moderate to advanced degenerative changes with areas of full or near full-thickness cartilage loss, joint space narrowing, spurring and subchondral cystic change. Labrum:  Labral degenerative changes without obvious tear. Bones: No acute bony findings. There appears to be a small defect in the glenoid with fluid tracking back  into the subcoracoid space. Possible old fracture. Other: Expected fluid in the subacromial/subdeltoid bursa. IMPRESSION: 1. Full-thickness retracted tear of the supraspinatus tendon as described above. 2. Intact long head biceps tendon and grossly intact glenoid labrum. 3. Advanced glenohumeral joint degenerative changes. 4. Moderate AC joint degenerative changes but no other significant findings for bony impingement. Electronically Signed   By: Marijo Sanes M.D.   On: 04/01/2020 20:48   DG Abd Portable 2V  Result Date: 04/15/2020 CLINICAL DATA:  Diverticulitis.  Abdominal pain and distention. EXAM: PORTABLE ABDOMEN - 2 VIEW COMPARISON:  CT 04/14/2020. FINDINGS: Pelvic drainage catheter again noted. No bowel distention. Stool noted throughout the colon. No free air. Bibasilar atelectasis. Degenerative change lumbar spine and both hips. Contrast in the bladder from prior CT. IMPRESSION: 1.  Pelvic drainage catheter in stable position. 2. No acute abdominal abnormality identified. No bowel distention or free air. 3.  Bibasilar atelectasis. Electronically Signed   By: Marcello Moores  Register   On: 04/15/2020 08:07   CT IMAGE GUIDED DRAINAGE PERCUT CATH  PERITONEAL RETROPERIT  Result Date: 04/14/2020 INDICATION: 59 year old with air-fluid collection in the lower abdomen / upper pelvic region. Findings are suggestive for a colonic diverticular abscess. Patient needs CT-guided drainage. EXAM: CT-GUIDED DRAINAGE OF PELVIC ABSCESS MEDICATIONS: Fentanyl 25 mcg ANESTHESIA/SEDATION: The patient was continuously monitored during the procedure by the interventional radiology nurse under my direct supervision. COMPLICATIONS: None immediate. PROCEDURE: Informed written consent was obtained from the patient after a thorough discussion of the procedural risks, benefits and alternatives. All questions were addressed. A timeout was performed prior to the initiation of the procedure. Patient was placed supine on the CT scanner.  Images through the lower abdomen and pelvis were performed. Air-fluid collection in the lower abdomen/pelvic region was identified and targeted for drainage. The anterior abdomen was prepped with chlorhexidine and sterile field was created. Skin and soft tissues were anesthetized using  1% lidocaine. A small incision was made. Using CT guidance, a 18 gauge trocar needle was directed into the air-fluid collection. Gray purulent fluid was coming out of the needle. Super stiff Amplatz wire was advanced into the collection and follow up CT images were obtained. The drain was dilated to accommodate a 12 [place] multipurpose drain. 12 French drain was placed. 40 mL gray colored purulent fluid and gas was removed from the collection. Follow up CT images were obtained. Catheter was sutured to skin and attached to a suction bulb. FINDINGS: Air-fluid collection just cephalad to the urinary bladder. Drain was successfully placed within the collection and 40 mL of purulent fluid was removed. The abscess was decompressed at the end of the procedure. IMPRESSION: CT-guided placement of a drainage catheter within the lower abdomen/pelvic abscess. Electronically Signed   By: Markus Daft M.D.   On: 04/14/2020 11:48    Assessment & Plan    59 yo female with history of copd, hypertension HIV , admitted with sepsis and has developed elevated liver transaminases,acute renal failure requiring hd and afib with rvr.  Afib-likely secondary to sepsis, renal failure etc. Rate is poorly controlled when she is in A. fib however she has had transient episodes of sinus rhythm/sinus tachycardia. Driven somewhat by agitation and comorbidities.  We will continue to attempt to convert back to sinus rhythm and/or rate control with IV amiodarone.  We will give her another bolus this morning and follow.  Blood pressure relatively soft.  May need to consider transient use of digoxin however given renal insufficiency will attempt to avoid this.  Not a  candidate for anticoagulation due to pelvic abcess that was just drained. Anticoagulation will need to be addressed at discharge. Did not respond to cardizem. Will sue amiodarone for now and follow hemodynaics.   Renal failure-on HD.  Renal insufficiency likely secondary to contrast.  Sepsis-appreciate id help. Continues on emperic abx.  Liver failure-elevated transaminases  Signed, Javier Docker. Reginae Wolfrey MD 04/19/2020, 9:52 AM  Pager: [phone]

## 2020-04-20 LAB — AEROBIC/ANAEROBIC CULTURE W GRAM STAIN (SURGICAL/DEEP WOUND): Gram Stain: NONE SEEN

## 2020-04-20 MED ORDER — SODIUM ZIRCONIUM CYCLOSILICATE 10 G PO PACK
10.0000 g | PACK | Freq: Every day | ORAL | Status: DC
  Administered 2020-04-20 – 2020-05-03 (×12): 10 g via ORAL
  Filled 2020-04-20 (×2): qty 2
  Filled 2020-04-20 (×3): qty 1
  Filled 2020-04-20: qty 2
  Filled 2020-04-20 (×10): qty 1
  Filled 2020-04-20: qty 2

## 2020-04-20 NOTE — Consult Note (Signed)
Pharmacy Antibiotic Note  Melanie Bowen is a 59 y.o. female admitted on 04/14/2020 with intra-abdominal infection - decompensating on Zosyn.   -perforated diverticulitisand peridiverticular abscess Pharmacy has been consulted for Meropenem dosing.  ID following  Plan: Continue meropenem 1g q12h,   Crcl 21 ml/min  8/16 wound cx: ABUNDANT KLEBSIELLA PNEUMONIAE  ABUNDANT PSEUDOMONAS AERUGINOSA  MODERATE ESCHERICHIA COLI  MIXED ANAEROBIC FLORA PRESENT.  (Klebsiella and pseudomonas sensitive to imipenem)   Height: 5\' 6"  (167.6 cm) Weight: 121.6 kg (268 lb 1.3 oz) IBW/kg (Calculated) : 59.3  Temp (24hrs), Avg:98.5 F (36.9 C), Min:97.9 F (36.6 C), Max:99 F (37.2 C)  Recent Labs  Lab 04/14/20 1245 04/15/20 0200 04/15/20 0609 04/15/20 0609 04/15/20 1337 04/15/20 1631 04/16/20 0348 04/16/20 0900 04/16/20 1607 04/17/20 0603 04/18/20 0442 04/19/20 0458 04/19/20 0848  WBC   < >  --  17.2*  --   --   --  15.2*  --   --  22.6* 24.8* 24.7*  --   CREATININE   < >  --  3.23*   < >  --   --  4.48* 4.65*  --  4.51* 4.21*  --  3.89*  LATICACIDVEN  --  7.2* 10.9*  --  6.1* 5.7*  --   --  1.6  --   --   --   --    < > = values in this interval not displayed.    Estimated Creatinine Clearance: 21 mL/min (A) (by C-G formula based on SCr of 3.89 mg/dL (H)).    Allergies  Allergen Reactions  . Acetaminophen Nausea And Vomiting  . Ibuprofen Other (See Comments)    Reports causes bleeding  . Gabapentin Rash  . Morphine Itching and Rash  . Morphine And Related Itching  . Zanaflex  [Tizanidine] Rash    Antimicrobials this admission: Valacyclovir 8/17 >> Ceftriaxone/metronidazole 8/16>8/17 Zosyn 8/16 >>8/17 Meropenem 8/17 >> Anidulafungin 8/17 >> Levaquin x 1 8/20    Thank you for allowing pharmacy to be a part of this patient's care.  Noralee Space, PharmD Clinical Pharmacist 04/20/2020 8:48 AM

## 2020-04-20 NOTE — Progress Notes (Signed)
[facility] [place] NAME: Melanie Bowen    MR#:  [id number]  DATE OF BIRTH:  12-13-1960  SUBJECTIVE:  CHIEF COMPLAINT:   Chief Complaint  Patient presents with  . Abdominal Pain  Patient sleeping, mouth breather.  No new issues. Daughter at [facility] course:   Per HPI:  hypertension, diverticulosis, HIV, GERD and depression, presented with acute onset of worsening left lower quadrant abdominal pain with N/V  And constipation.  The patient started having pain Friday 9 days ago when she was seen in the ER and given p.o. Augmentin .    Upon presentation BP: 151/98 with otherwise normal vital signs.  Labs revealed albumin of 2.8 with troponin of 6.4 and otherwise unremarkable CMP.  Urinalysis was unremarkable.  Abdominal and pelvic CT scan revealed the following: 1. Findings consistent with acute sigmoid colon diverticulitis. 7.5 x 4.4 cm gas and fluid collection within the anterior pelvis suspicious for contained perforation/abscess, this appears to communicate with the thickened inflamed sigmoid colon. 2. Small amount of upper abdominal and pelvic ascites. 3. Thickened appearance of pelvic small bowel loops, likely reactive  The patient was given 1 L bolus of IV lactated Ringer, 1 p.o. Norco, half milligram of IV Dilaudid, 4 mg of IV morphine sulfate and 4 mg of IV Zofran as well as 3.375 g of IV Zosyn.  Dr. Christian Mate was consulted about the patient and he will notify IR in a.m.     04/14/2020 patient hemodynamically decline was transferred to critical care team Surgery interventional radiology critical care team was consulted  04/15/2020 -patient remained under PCCM care lactic acid continues to worsen, now 7.2 (previously 6.1).  CT Abdomen/Pelvis obtained at 22:00 earlier in the shift, which was NEGATIVE   04/16/2020 -patient remains critically ill, improving hemodynamically, status post septic shock, multiorgan failure including  renal failure -nephrology was consulted patient will remain in ICU  04/17/2020-patient was seen, still critically ill, in respiratory distress audibly wheezing, with dialysis was initiated yesterday,-resuming again this morning ID following IV antibiotics surgery following still recommending no surgery -gallbladder drain in place  8/20 / 2021 -patient was seen, more awake, status post hemodialysis once again yesterday, still tachycardic, tachypneic, satting 97% on 3 L of oxygen   04/19/2020 -patient seems better she is responding to hemodialysis, still in A. fib with RVR cardiology attending to control rate with amiodarone, blood pressure remained soft -remains lethargic family present at bedside   Subjective: The patient was seen and examined this morning, much more awake alert dramatic improvement in mental status, following commands. Per nursing staff passed bedside swallow eval, tolerating p.o. now O2 demand has improved,  Remain in A. fib with RVR on amiodarone drip Status post last hemodialysis 04/18/2020,   Patient has been weaned off 6 L of oxygen to 3 L, to 3 L now this morning on room air satting 96%      ASSESSMENT AND PLAN:    Septic shock  -source diverticulosis/abscess -Dramatic improvement noted this a.m. -Hemodynamically stable, patient has been weaned off oxygen, remains in A. fib with RVR On amiodarone drip  -Status post IR placement of gallbladder drain  -Much improved respiratory status, has been weaned off from BiPAP >> oxygen via nasal cannula 6 L, 3 >>2 to room air this a.m. satting 96% -Acute renal failure due to septic shock, S/P hemodialysis on  8/18, 8/19 , 8/20 Nephrology following planning for  no further dialysis today    Patient was initially severely hypotensive, was started on Levophed which was tapered off Now since started on amiodarone and hemodialysis blood pressure remained soft  Lactic acid 5.7, 10.9, >>>6.1 >>>1.6.Marland Kitchen  -Off BiPAP, high  flow oxygen, now down to 2 L O2 by nasal cannula satting 97%  -present on admission with hypotension, tachycardia, With multiorgan failure - acute liver and kidney failure -Levophed as need per PCCM -Source of infection is diverticular abscess status post CT-guided drainage.  Pelvic drainage catheter in stable position.  Surgery following.   Repeat CT scan shows on 8/16 no worsening. -Consult PCCM, general surgery, IR, nephrology following closely  ID following Worsening leukocytosis 15.2, 22.6, >>> 24.8 >>22.6 today Remains afebrile, mildly hypotensive -was on Flagyl and Rocephin >>> has been switched to Meropenem  Lactic acidosis -due to sepsis, septic shock also -tenofovir can cause severe lactic acidosis. ->  Genvoya is stopped for now. -Abdominal x-ray not showing any free air  -Blood/urine cultures negative to date -Gallbladder fluid anaerobic aerobic culture growing gram-positive cocci/rods  Acute hypoxic respiratory failure secondary to COPD exacerbation Patient refused BiPAP, supplemental 6 L, 3 L, 2 L, now on room air satting 96% PCCM was following -signed off -Now supplement O2 as needed -Tapering down steroids, continue DuoNeb   Acute kidney failure -due to sepsis shock-hypertension  - ATN Hemodialysis initiated on 8/18/202, 8/19, 8/20 >>> responded well, monitoring -Patient was severely oliguric  --monitor urine output -No anticipation for hemodialysis today  Foley catheter placed on 8/17 per surgery/PCCM Creatinine worsened from 0.8->1.28-> 1.78-> 3.23>>4.48 >> 4.51 >> 3.89 >>   Nephrology -following very closely  IV fluids were discontinued, initiated Lasix  A. fib with RVR -Remains in A. fib with RVR, heart rate as high as 170s -Cardiology following -Remains on amiodarone drip -Poor candidate for Cardizem did not respond also blood pressure remained soft -Withholding anticoagulation at this time due to severe infection, poor drain in place Risk of bleeding  outweighs the benefit    Acute liver failure -Likely due to septic shock, LFTs continue to improve AST of 1634  >> 791  >> 94 >>   ALT 853 >>  1153 >> 419 Alk phos 60, 84 >> 127 >> 113 Avoid hepatotoxic and nephrotoxic medications  Acute sigmoid diverticulitis and diverticular abscess s/p CT-guided drainage by IR on 8/16 -Drain is still in place, patient improving IR consulted, status post CT-guided gallbladder drain -Continue IV antibiotics    Morbid obesity Complicates overall care Body mass index is 43.27 kg/m.    Metabolic encephalopathy -Much improved mentation, likely back to baseline, alert oriented x3 -Likely cause sepsis, hypoxia, metabolic and respiratory acidosis -We will continue with neurochecks   Ethics:palliative care c/s for GOC  -remains on partial code Prognosis remain poor due to comorbidities, not progressively declining due to sepsis, septic shock with multiorgan failure..    Status is: Inpatient  Dispo: The patient is from: Home              Anticipated d/c is to: Home              Anticipated d/c date is: > 3 days              Patient currently is not medically stable to d/c.    DVT prophylaxis:            enoxaparin (LOVENOX) injection 30 mg Start: 04/16/20 1000     Family Communication: discussed with Daughter  All the records are reviewed and case discussed with Care Management/Social Worker. Management plans discussed with the patient, family (Daughter at bedside) and they are in agreement.  CODE STATUS: Partial Code  TOTAL TIME TAKING CARE OF THIS PATIENT: 55 minutes.   More than 50% of the time was spent in counseling/coordination of care: YES  POSSIBLE D/C IN 3-4 DAYS, DEPENDING ON CLINICAL CONDITION.      DRUG ALLERGIES:   Allergies  Allergen Reactions  . Acetaminophen Nausea And Vomiting  . Ibuprofen Other (See Comments)    Reports causes bleeding  . Gabapentin Rash  . Morphine Itching and Rash  . Morphine And  Related Itching  . Zanaflex  [Tizanidine] Rash   VITALS:  Blood pressure (!) 118/96, pulse 99, temperature 97.9 F (36.6 C), temperature source Oral, resp. rate (!) 23, height 5' 6" (1.676 m), weight 121.6 kg, SpO2 100 %. PHYSICAL EXAMINATION:         Physical Exam:   General:   Much improved mental status, awake alert, following commands  HEENT:  Normocephalic, PERRL, otherwise with in Normal limits   Neuro:  CNII-XII intact. , normal motor and sensation, reflexes intact   Lungs:   Clear to auscultation BL, Respirations unlabored, no wheezes / crackles  Cardio:    S1/S2, arrhythmic, tachycardic, irregularly irregular  Abdomen:   Soft, non-tender, bowel sounds active all four quadrants,  no guarding or peritoneal signs.  Muscular skeletal:  Limited exam - in bed, able to move all 4 extremities, Normal strength,  2+ pulses,  symmetric, +2 pitting edema  Skin:  Dry, warm to touch, negative for any Rashes, No open wounds  Wounds: Please see nursing documentation      JP drain present in the left lower quadrant draining serosanguineous drainage        Foley inserted on 8/17 LABORATORY PANEL:  Female CBC Recent Labs  Lab 04/19/20 0458  WBC 24.7*  HGB 11.5*  HCT 31.3*  PLT 36*   ------------------------------------------------------------------------------------------------------------------ Chemistries  Recent Labs  Lab 04/19/20 0848  NA 136  K 5.5*  CL 100  CO2 18*  GLUCOSE 143*  BUN 61*  CREATININE 3.89*  CALCIUM 8.2*  MG 1.9  AST 94*  ALT 419*  ALKPHOS 113  BILITOT 1.2   RADIOLOGY:  DG Abd 1 View  Result Date: 04/14/2020 CLINICAL DATA:  Abdominal pain.  Pelvic abscess drainage today. EXAM: ABDOMEN - 1 VIEW COMPARISON:  CT abdomen pelvis from same day. FINDINGS: The bowel gas pattern is normal. No intraperitoneal free air peer no radio-opaque calculi or other significant radiographic abnormality are seen. No acute osseous abnormality. Prior lumbar fusion.  IMPRESSION: 1. Negative. Electronically Signed   By: Titus Dubin M.D.   On: 04/14/2020 14:37   CT ABDOMEN PELVIS W CONTRAST  Result Date: 04/14/2020 CLINICAL DATA:  Peritonitis or perforation suspected Patient with history of perforated diverticulitis post percutaneous drain placement today. EXAM: CT ABDOMEN AND PELVIS WITH CONTRAST TECHNIQUE: Multidetector CT imaging of the abdomen and pelvis was performed using the standard protocol following bolus administration of intravenous contrast. CONTRAST:  2m OMNIPAQUE IOHEXOL 300 MG/ML  SOLN COMPARISON:  Two prior abdominopelvic CT earlier this day, prior to drain placement.  IMPRESSION: 1. Interval percutaneous drainage of pelvic abscess. No residual fluid collection. 2. Small amount of non organized free fluid in the pelvis, pericolic gutters, and perihepatic right upper quadrant, slight increase from pre drainage CT earlier today. No evidence of new abscess. No free air or interval  perforation. 3. Heterogeneous enhancement of both kidneys, nonspecific but can be seen with pyelonephritis. 4. Small pleural effusions are new from prior exam. Adjacent compressive atelectasis. Electronically Signed   By: Keith Rake M.D.   On: 04/14/2020 22:22   DG Abd Portable 2V  Result Date: 04/15/2020 CLINICAL DATA:  Diverticulitis.  Abdominal pain and distention. EXAM: PORTABLE ABDOMEN - 2 VIEW COMPARISON:  CT 04/14/2020. FINDINGS: Pelvic drainage catheter again noted. No bowel distention. Stool noted throughout the colon. No free air. Bibasilar atelectasis. Degenerative change lumbar spine and both hips. Contrast in the bladder from prior CT. IMPRESSION: 1.  Pelvic drainage catheter in stable position. 2. No acute abdominal abnormality identified. No bowel distention or free air. 3.  Bibasilar atelectasis. Electronically Signed   By: Marcello Moores  Register   On: 04/15/2020 08:07      Deatra James M.D on 04/20/2020 at 12:21 PM  [facility]   CC:  Primary care physician; Alene Mires Elyse Jarvis, MD

## 2020-04-20 NOTE — Progress Notes (Signed)
Assisted daughter with tele visit via elilnk

## 2020-04-20 NOTE — Progress Notes (Signed)
Patient Name: Melanie Bowen Date of Encounter: 04/20/2020  Hospital Problem List     Principal Problem:   Severe sepsis with septic shock ([place]) Active Problems:   HTN (hypertension)   HIV (human immunodeficiency virus infection) ([place])   Diverticulitis of colon   Abscess of sigmoid colon due to diverticulitis   Acute renal failure (ARF) ([place])   Hypotension   Diverticulosis   Acute respiratory distress   DNR (do not resuscitate) discussion   Palliative care by specialist    Patient Profile     59 year old female with COPD, hypertension, diverticulosis, HIV, gastroesophageal reflux disease who presented with abdominal pain nausea and was noted to have a pelvic abscess.  She had a CT-guided drainage and has a drain in place.  She had atrial fibrillation rapid ventricular response which is a difficult control.  Had acute renal insufficiency as well as elevated transaminases.  Subjective   Improved today. Much more calm. Less agitated. Resting comfortably.   Inpatient Medications    . budesonide (PULMICORT) nebulizer solution  0.5 mg Nebulization BID  . Chlorhexidine Gluconate Cloth  6 each Topical Daily  . guaiFENesin-dextromethorphan  10 mL Oral Q8H  . ipratropium-albuterol  3 mL Nebulization Q4H  . methylPREDNISolone (SOLU-MEDROL) injection  40 mg Intravenous Q6H  . mometasone-formoterol  2 puff Inhalation BID  . pantoprazole (PROTONIX) IV  40 mg Intravenous Daily  . sodium chloride flush  5 mL Intracatheter Q8H  . sodium zirconium cyclosilicate  10 g Oral Daily    Vital Signs    Vitals:   04/20/20 0600 04/20/20 0700 04/20/20 0735 04/20/20 0740  BP: 109/66 (!) 118/96    Pulse: 94 98  99  Resp: (!) 25 (!) 23  (!) 23  Temp:   97.9 F (36.6 C)   TempSrc:   Oral   SpO2: 99% 99%  100%  Weight:      Height:        Intake/Output Summary (Last 24 hours) at 04/20/2020 1024 Last data filed at 04/20/2020 0743 Gross per 24 hour  Intake 1117.29 ml  Output 249 ml  Net  868.29 ml   Filed Weights   04/13/20 2127 04/14/20 1822  Weight: 117.9 kg 121.6 kg    Physical Exam    GEN: Well nourished, well developed, in no acute distress.  HEENT: normal.  Neck: Supple, no JVD, carotid bruits, or masses. Cardiac: RRR, no murmurs, rubs, or gallops. No clubbing, cyanosis, edema.  Radials/DP/PT 2+ and equal bilaterally.  Respiratory:  Respirations regular and unlabored, clear to auscultation bilaterally. GI: Soft, nontender, nondistended, BS + x 4. MS: no deformity or atrophy. Skin: warm and dry, no rash. Neuro:  Strength and sensation are intact. Psych: Normal affect.  Labs    CBC Recent Labs    04/18/20 0442 04/19/20 0458  WBC 24.8* 24.7*  HGB 12.0 11.5*  HCT 32.4* 31.3*  MCV 83.5 82.2  PLT 31* 36*   Basic Metabolic Panel Recent Labs    04/18/20 0442 04/19/20 0848  NA 136 136  K 4.8 5.5*  CL 101 100  CO2 20* 18*  GLUCOSE 126* 143*  BUN 63* 61*  CREATININE 4.21* 3.89*  CALCIUM 7.3* 8.2*  MG 1.9 1.9  PHOS 5.9* 6.2*   Liver Function Tests Recent Labs    04/18/20 0442 04/19/20 0848  AST 247* 94*  ALT 746* 419*  ALKPHOS 120 113  BILITOT 0.7 1.2  PROT 5.5* 5.2*  ALBUMIN 2.0* 1.9*   No results  for input(s): LIPASE, AMYLASE in the last 72 hours. Cardiac Enzymes No results for input(s): CKTOTAL, CKMB, CKMBINDEX, TROPONINI in the last 72 hours. BNP No results for input(s): BNP in the last 72 hours. D-Dimer No results for input(s): DDIMER in the last 72 hours. Hemoglobin A1C No results for input(s): HGBA1C in the last 72 hours. Fasting Lipid Panel No results for input(s): CHOL, HDL, LDLCALC, TRIG, CHOLHDL, LDLDIRECT in the last 72 hours. Thyroid Function Tests No results for input(s): TSH, T4TOTAL, T3FREE, THYROIDAB in the last 72 hours.  Invalid input(s): FREET3  Telemetry    Currently nsr at 93 bpm but has intermittent afib with rvr  ECG       Radiology    CT ABDOMEN PELVIS WO CONTRAST  Result Date:  04/18/2020 CLINICAL DATA:  59 year old female with concern for abdominal abscess or infection. EXAM: CT ABDOMEN AND PELVIS WITHOUT CONTRAST TECHNIQUE: Multidetector CT imaging of the abdomen and pelvis was performed following the standard protocol without IV contrast. COMPARISON:  CT abdomen pelvis dated 04/14/2020. FINDINGS: Evaluation of this exam is limited due to respiratory motion artifact. Lower chest: Partially visualized clusters of ground-glass opacity primarily involving the right lung base may represent areas of atelectasis but concerning for pneumonia. Clinical correlation is recommended. No intra-abdominal free air. There is a small free fluid within the pelvis. Hepatobiliary: The liver is unremarkable. No intrahepatic biliary ductal dilatation. High attenuating content within the gallbladder, likely vicarious excretion of contrast. Pancreas: Unremarkable. No pancreatic ductal dilatation or surrounding inflammatory changes. Spleen: Normal in size without focal abnormality. Adrenals/Urinary Tract: The adrenal glands unremarkable. Retained contrast from prior CT within the renal parenchyma consistent with delayed clearance and acute renal insufficiency. There is striated renal parenchyma concerning for pyelonephritis. Correlation with urinalysis recommended. There is no hydronephrosis. The visualized ureters appear unremarkable. The urinary bladder is decompressed around a Foley catheter. Stomach/Bowel: There is distal colonic and sigmoid diverticulosis without active inflammatory changes. Several mildly dilated small bowel loops measuring up to 3.4 cm in caliber without a transition most likely an ileus. Oral contrast traverses into the cecum. The appendix is normal. Vascular/Lymphatic: Advanced aortoiliac atherosclerotic disease. The IVC is unremarkable. No portal venous gas. There is no adenopathy. Reproductive: The uterus is grossly unremarkable. Other: Diffuse edema of the pelvic floor with small  free fluid. Overall decrease in the inflammatory changes and stranding compared to the prior CT. A pigtail drainage catheter is noted within the pelvis in similar position. No drainable fluid collection identified. Musculoskeletal: There is osteopenia with degenerative changes of the spine. L3-L5 disc spacer and posterior fusion. There is disc desiccation and vacuum phenomena at L5-S1. No acute osseous pathology. IMPRESSION: 1. Partially visualized right lung base atelectasis or pneumonia. Clinical correlation is recommended. 2. Retained contrast within the renal parenchyma concerning for acute renal insufficiency. There is heterogeneous and striated nephrogram which may represent pyelonephritis. Correlation with urinalysis recommended. 3. Mild ileus.  No definite obstruction. 4. Colonic diverticulosis. 5. Overall decrease in the inflammatory changes and stranding compared to the prior CT. A pigtail drainage catheter within the pelvis is similar position. No drainable fluid collection identified. 6. Aortic Atherosclerosis (ICD10-I70.0). Electronically Signed   By: Anner Crete M.D.   On: 04/18/2020 17:59   CT ABDOMEN PELVIS WO CONTRAST  Result Date: 04/14/2020 CLINICAL DATA:  Abdominal pain. Contained perforated acute diverticulitis. EXAM: CT ABDOMEN AND PELVIS WITHOUT CONTRAST TECHNIQUE: Multidetector CT imaging of the abdomen and pelvis was performed following the standard protocol without IV contrast. COMPARISON:  CT 04/14/2020, 01/31/2019 FINDINGS: Lower chest: Lung bases are clear. Hepatobiliary: No focal hepatic lesion. No biliary duct dilatation. Common bile duct is normal. Pancreas: Pancreas is normal. No ductal dilatation. No pancreatic inflammation. Spleen: Normal spleen Adrenals/urinary tract: Adrenal glands and kidneys are normal. The ureters and bladder normal. Stomach/Bowel: Stomach duodenum normal. Mildly thickened loops small bowel in the pelvis is favored secondary inflammation related to  pelvic abscess. Appendix is partially imaged (image 61/2) appears normal. The ascending and transverse colon normal. There are diverticula descending colon and sigmoid colon. Diverticular disease is heavy in the proximal sigmoid colon. There is a thick-walled fluid collection with the an air-fluid level measuring 8.1 x 4.7 cm in the central anterior upper pelvis. This is adjacent to the sigmoid colon. There is a thin tract between the colon in the fluid collection on image 71/2. Similar pericolonic abscess seen on CT 01/17/2019. Findings most consistent with contained sigmoid perforation with abscess formation. No intraperitoneal free.  Minimal fluid in the pelvis. Vascular/Lymphatic: Abdominal aorta is normal caliber with atherosclerotic calcification. There is no retroperitoneal or periportal lymphadenopathy. No pelvic lymphadenopathy. Reproductive: Post hysterectomy.  Adnexa unremarkable Other: Small volume free fluid the pelvis Musculoskeletal: Posterior lumbar fusion IMPRESSION: 1. No significant interval change in short interval follow-up. 2. Contained sigmoid colon perforation with abscess formation. Perforation presumed related to diverticulitis. Findings similar to May 2020 with increased volume of contained perforation. 3. No intraperitoneal free air. 4. Secondary inflammation of the small bowel. Electronically Signed   By: Suzy Bouchard M.D.   On: 04/14/2020 08:08   DG Chest 1 View  Result Date: 04/14/2020 CLINICAL DATA:  Dyspnea EXAM: CHEST  1 VIEW COMPARISON:  11/06/2019 FINDINGS: Mildly low lung volumes. There is no edema, consolidation, effusion, or pneumothorax. Normal heart size and mediastinal contours. Artifact from EKG leads IMPRESSION: No evidence of acute disease. Electronically Signed   By: Monte Fantasia M.D.   On: 04/14/2020 07:17   DG Abd 1 View  Result Date: 04/14/2020 CLINICAL DATA:  Abdominal pain.  Pelvic abscess drainage today. EXAM: ABDOMEN - 1 VIEW COMPARISON:  CT abdomen  pelvis from same day. FINDINGS: The bowel gas pattern is normal. No intraperitoneal free air peer no radio-opaque calculi or other significant radiographic abnormality are seen. No acute osseous abnormality. Prior lumbar fusion. IMPRESSION: 1. Negative. Electronically Signed   By: Titus Dubin M.D.   On: 04/14/2020 14:37   CT ABDOMEN PELVIS W CONTRAST  Result Date: 04/14/2020 CLINICAL DATA:  Peritonitis or perforation suspected Patient with history of perforated diverticulitis post percutaneous drain placement today. EXAM: CT ABDOMEN AND PELVIS WITH CONTRAST TECHNIQUE: Multidetector CT imaging of the abdomen and pelvis was performed using the standard protocol following bolus administration of intravenous contrast. CONTRAST:  51mL OMNIPAQUE IOHEXOL 300 MG/ML  SOLN COMPARISON:  Two prior abdominopelvic CT earlier this day, prior to drain placement. FINDINGS: Lower chest: Small pleural effusions are new from prior exam. Adjacent compressive atelectasis. Breathing motion artifact in the lung bases limits assessment. Hepatobiliary: Small amount of perihepatic fluid slightly increased from prior exam. This appears mildly complex. High-density contents in the urinary bladder increasing likely vicarious excretion of IV contrast. There is no discrete focal hepatic abnormality allowing for motion. Pancreas: Motion artifact through the pancreas. No obvious inflammation. Spleen: Normal allowing for motion. Adrenals/Urinary Tract: No adrenal nodule. There is heterogeneous enhancement of both kidneys. No significant perinephric edema. Minimal excretion from both kidneys on delayed phase imaging. There is excreted IV contrast within the urinary  bladder. Stomach/Bowel: New surgical drain within the pelvic fluid collection. No definite residual fluid. A drain is closely approximated to fecalized loops of small bowel in the pelvis. Motion artifact limits detailed bowel assessment. Similar appearance of scattered small bowel  thickening from prior exam which is likely reactive. There is no evidence of obstruction. No bowel pneumatosis. Diverticulosis involving the colon. No new colonic inflammation or diverticulitis. Air-fluid level in the stomach with small amount of fluid in the distal esophagus. Vascular/Lymphatic: Aortic atherosclerosis. No aortic aneurysm. No evidence of portal venous or mesenteric gas. No bulky adenopathy. Reproductive: Uterus and bilateral adnexa are unremarkable. Other: Surgical drain within the pelvic fluid collection which appears completely decompressed. No definite residual collection. Generalized fat stranding involving the lower small bowel mesentery. Small amount of non organized free fluid in the dependent pelvis, tracking into the pericolic gutters and right upper quadrant. There is no definite free air. Musculoskeletal: Stable. Postsurgical change in the lower lumbar spine. IMPRESSION: 1. Interval percutaneous drainage of pelvic abscess. No residual fluid collection. 2. Small amount of non organized free fluid in the pelvis, pericolic gutters, and perihepatic right upper quadrant, slight increase from pre drainage CT earlier today. No evidence of new abscess. No free air or interval perforation. 3. Heterogeneous enhancement of both kidneys, nonspecific but can be seen with pyelonephritis. 4. Small pleural effusions are new from prior exam. Adjacent compressive atelectasis. Electronically Signed   By: Keith Rake M.D.   On: 04/14/2020 22:22   CT Abdomen Pelvis W Contrast  Result Date: 04/14/2020 CLINICAL DATA:  Lower abdominal pain EXAM: CT ABDOMEN AND PELVIS WITH CONTRAST TECHNIQUE: Multidetector CT imaging of the abdomen and pelvis was performed using the standard protocol following bolus administration of intravenous contrast. CONTRAST:  172mL OMNIPAQUE IOHEXOL 300 MG/ML  SOLN COMPARISON:  01/31/2019 FINDINGS: Lower chest: Lung bases demonstrate no acute consolidation or pleural effusion.  Normal cardiac size. Hepatobiliary: No focal liver abnormality is seen. No gallstones, gallbladder wall thickening, or biliary dilatation. Pancreas: Unremarkable. No pancreatic ductal dilatation or surrounding inflammatory changes. Spleen: Normal in size without focal abnormality. Adrenals/Urinary Tract: Adrenal glands are unremarkable. Kidneys are normal, without renal calculi, focal lesion, or hydronephrosis. Bladder is unremarkable. Stomach/Bowel: The stomach is nonenlarged. No dilated small bowel. Mildly thickened loops of small bowel within the pelvis, likely reactive. Negative appendix. Sigmoid colon diverticular disease. Wall thickening and mild inflammatory change at the distal sigmoid colon consistent with diverticulitis. 7.5 x 4.4 cm gas and fluid collection within the anterior pelvis suspicious for contained perforation/abscess. Soft tissue inflammatory density extends from inflamed sigmoid colon to the gas and fluid collection, sagittal series 6, image number 96. Vascular/Lymphatic: Moderate aortic atherosclerosis. No aneurysm. No suspicious nodes. Reproductive: Uterus and bilateral adnexa are unremarkable. Other: No free air. Small free fluid in the pelvis. Small amount of upper abdominal ascites. Generalized soft tissue stranding within the pelvis consistent with inflammatory process. Musculoskeletal: Postsurgical changes of the lumbar spine with surgical rod and fixating screws L3 through L5. No acute osseous abnormality IMPRESSION: 1. Findings consistent with acute sigmoid colon diverticulitis. 7.5 x 4.4 cm gas and fluid collection within the anterior pelvis suspicious for contained perforation/abscess, this appears to communicate with the thickened inflamed sigmoid colon. 2. Small amount of upper abdominal and pelvic ascites. 3. Thickened appearance of pelvic small bowel loops, likely reactive Aortic Atherosclerosis (ICD10-I70.0). Electronically Signed   By: Donavan Foil M.D.   On: 04/14/2020 01:27    US RENAL  Result Date: 04/16/2020 CLINICAL DATA:  Acute renal failure. EXAM: RENAL / URINARY TRACT ULTRASOUND COMPLETE COMPARISON:  CT abdomen and pelvis 04/14/2020 FINDINGS: Right Kidney: Renal measurements: 11.5 x 5.3 x 4.9 cm = volume: 158 mL. Echogenicity within normal limits. No mass or hydronephrosis visualized. Left Kidney: Renal measurements: 11.3 x 5.5 x 6.1 cm = volume: 197 mL. Echogenicity within normal limits. No mass or hydronephrosis visualized. Bladder: Decompressed by Foley catheter. Other: None. IMPRESSION: Negative renal ultrasound. Electronically Signed   By: Logan Bores M.D.   On: 04/16/2020 13:01   MR SHOULDER RIGHT WO CONTRAST  Result Date: 04/01/2020 CLINICAL DATA:  Right shoulder pain and limited range of motion. No specific injury. EXAM: MRI OF THE RIGHT SHOULDER WITHOUT CONTRAST TECHNIQUE: Multiplanar, multisequence MR imaging of the shoulder was performed. No intravenous contrast was administered. COMPARISON:  None. FINDINGS: Examination limited by patient motion. Rotator cuff: Full-thickness retracted tear of the supraspinatus tendon is noted. This involves the mid and posterior fibers. The tear is in the critical zone region. Maximum retraction is estimated at 22 mm and the tear is 15.5 mm wide. The anterior fibers are still intact and the subscapularis tendon is intact. The infraspinatus tendon is intact. Tendinopathy noted with interstitial tears. Muscles:  No significant findings. Biceps long head:  Intact. Acromioclavicular Joint: Moderate degenerative changes. Type 2 acromion. No significant lateral downsloping or undersurface spurring. Glenohumeral Joint: Moderate to advanced degenerative changes with areas of full or near full-thickness cartilage loss, joint space narrowing, spurring and subchondral cystic change. Labrum:  Labral degenerative changes without obvious tear. Bones: No acute bony findings. There appears to be a small defect in the glenoid with fluid tracking  back into the subcoracoid space. Possible old fracture. Other: Expected fluid in the subacromial/subdeltoid bursa. IMPRESSION: 1. Full-thickness retracted tear of the supraspinatus tendon as described above. 2. Intact long head biceps tendon and grossly intact glenoid labrum. 3. Advanced glenohumeral joint degenerative changes. 4. Moderate AC joint degenerative changes but no other significant findings for bony impingement. Electronically Signed   By: Marijo Sanes M.D.   On: 04/01/2020 20:48   DG Abd Portable 2V  Result Date: 04/15/2020 CLINICAL DATA:  Diverticulitis.  Abdominal pain and distention. EXAM: PORTABLE ABDOMEN - 2 VIEW COMPARISON:  CT 04/14/2020. FINDINGS: Pelvic drainage catheter again noted. No bowel distention. Stool noted throughout the colon. No free air. Bibasilar atelectasis. Degenerative change lumbar spine and both hips. Contrast in the bladder from prior CT. IMPRESSION: 1.  Pelvic drainage catheter in stable position. 2. No acute abdominal abnormality identified. No bowel distention or free air. 3.  Bibasilar atelectasis. Electronically Signed   By: Marcello Moores  Register   On: 04/15/2020 08:07   CT IMAGE GUIDED DRAINAGE PERCUT CATH  PERITONEAL RETROPERIT  Result Date: 04/14/2020 INDICATION: 59 year old with air-fluid collection in the lower abdomen / upper pelvic region. Findings are suggestive for a colonic diverticular abscess. Patient needs CT-guided drainage. EXAM: CT-GUIDED DRAINAGE OF PELVIC ABSCESS MEDICATIONS: Fentanyl 25 mcg ANESTHESIA/SEDATION: The patient was continuously monitored during the procedure by the interventional radiology nurse under my direct supervision. COMPLICATIONS: None immediate. PROCEDURE: Informed written consent was obtained from the patient after a thorough discussion of the procedural risks, benefits and alternatives. All questions were addressed. A timeout was performed prior to the initiation of the procedure. Patient was placed supine on the CT scanner.  Images through the lower abdomen and pelvis were performed. Air-fluid collection in the lower abdomen/pelvic region was identified and targeted for drainage. The anterior abdomen was prepped with chlorhexidine  and sterile field was created. Skin and soft tissues were anesthetized using 1% lidocaine. A small incision was made. Using CT guidance, a 18 gauge trocar needle was directed into the air-fluid collection. Gray purulent fluid was coming out of the needle. Super stiff Amplatz wire was advanced into the collection and follow up CT images were obtained. The drain was dilated to accommodate a 12 [place] multipurpose drain. 12 French drain was placed. 40 mL gray colored purulent fluid and gas was removed from the collection. Follow up CT images were obtained. Catheter was sutured to skin and attached to a suction bulb. FINDINGS: Air-fluid collection just cephalad to the urinary bladder. Drain was successfully placed within the collection and 40 mL of purulent fluid was removed. The abscess was decompressed at the end of the procedure. IMPRESSION: CT-guided placement of a drainage catheter within the lower abdomen/pelvic abscess. Electronically Signed   By: Markus Daft M.D.   On: 04/14/2020 11:48    Assessment & Plan      59 yo female with history of copd, hypertension HIV , admitted with sepsis and has developed elevated liver transaminases,acute renal failure requiring hd and afib with rvr.  Afib-likely secondary to sepsis, renal failure etc. has had intermittent episodes of sinus rhythm.  At time of my exam, she is in sinus rhythm at a rate of 93.  Does have breakthrough A. fib with RVR with rates in the 170s.  The episodes of A. fib appear to be improving.  We will continue with IV amiodarone for now.  Not a candidate for anticoagulation.  Blood pressure soft fairly stable on IV amiodarone.  Will attempt to convert to p.o. when sinus rhythm becomes more stable to help with her pressure.  Renal  failure-on HD.  Renal insufficiency likely secondary to contrast.  Creatinine 3.89 this morning.  Estimate approximately 200cc urine in the past 24 hours.  Sepsis-. Continues on meropenem.  Slowly improving.  Less agitated.  Liver failure-elevated transaminases  Respiratory failure on supplemental oxygen.  Signed, Javier Docker Javonni Macke MD 04/20/2020, 10:24 AM  Pager: [phone]

## 2020-04-20 NOTE — Progress Notes (Signed)
CC: Diverticulitis Subjective: Seems to be improving no abdominal pain.  Was able to have a diet. Still A. fib.  Objective: Vital signs in last 24 hours: Temp:  [97.9 F (36.6 C)-99 F (37.2 C)] 98.1 F (36.7 C) (08/22 1200) Pulse Rate:  [28-140] 98 (08/22 1300) Resp:  [15-28] 16 (08/22 1400) BP: (102-126)/(56-106) 111/56 (08/22 1400) SpO2:  [93 %-100 %] 99 % (08/22 1300) FiO2 (%):  [28 %] 28 % (08/21 2059) Last BM Date:  (PTA)  Intake/Output from previous day: 08/21 0701 - 08/22 0700 In: 1145 [I.V.:815; IV Piggyback:330] Out: 280 [Urine:280] Intake/Output this shift: Total I/O In: 281.5 [I.V.:181.5; IV Piggyback:100] Out: 71 [Urine:65; Drains:4]  Physical exam: She is somewhat persistent lethargic.  Able to open her eyes. Abd: soft, nt, drain cloudy, no peritonitis  Lab Results: CBC  Recent Labs    04/18/20 0442 04/19/20 0458  WBC 24.8* 24.7*  HGB 12.0 11.5*  HCT 32.4* 31.3*  PLT 31* 36*   BMET Recent Labs    04/18/20 0442 04/19/20 0848  NA 136 136  K 4.8 5.5*  CL 101 100  CO2 20* 18*  GLUCOSE 126* 143*  BUN 63* 61*  CREATININE 4.21* 3.89*  CALCIUM 7.3* 8.2*   PT/INR No results for input(s): LABPROT, INR in the last 72 hours. ABG No results for input(s): PHART, HCO3 in the last 72 hours.  Invalid input(s): PCO2, PO2  Studies/Results: CT ABDOMEN PELVIS WO CONTRAST  Result Date: 04/18/2020 CLINICAL DATA:  60 year old female with concern for abdominal abscess or infection. EXAM: CT ABDOMEN AND PELVIS WITHOUT CONTRAST TECHNIQUE: Multidetector CT imaging of the abdomen and pelvis was performed following the standard protocol without IV contrast. COMPARISON:  CT abdomen pelvis dated 04/14/2020. FINDINGS: Evaluation of this exam is limited due to respiratory motion artifact. Lower chest: Partially visualized clusters of ground-glass opacity primarily involving the right lung base may represent areas of atelectasis but concerning for pneumonia. Clinical  correlation is recommended. No intra-abdominal free air. There is a small free fluid within the pelvis. Hepatobiliary: The liver is unremarkable. No intrahepatic biliary ductal dilatation. High attenuating content within the gallbladder, likely vicarious excretion of contrast. Pancreas: Unremarkable. No pancreatic ductal dilatation or surrounding inflammatory changes. Spleen: Normal in size without focal abnormality. Adrenals/Urinary Tract: The adrenal glands unremarkable. Retained contrast from prior CT within the renal parenchyma consistent with delayed clearance and acute renal insufficiency. There is striated renal parenchyma concerning for pyelonephritis. Correlation with urinalysis recommended. There is no hydronephrosis. The visualized ureters appear unremarkable. The urinary bladder is decompressed around a Foley catheter. Stomach/Bowel: There is distal colonic and sigmoid diverticulosis without active inflammatory changes. Several mildly dilated small bowel loops measuring up to 3.4 cm in caliber without a transition most likely an ileus. Oral contrast traverses into the cecum. The appendix is normal. Vascular/Lymphatic: Advanced aortoiliac atherosclerotic disease. The IVC is unremarkable. No portal venous gas. There is no adenopathy. Reproductive: The uterus is grossly unremarkable. Other: Diffuse edema of the pelvic floor with small free fluid. Overall decrease in the inflammatory changes and stranding compared to the prior CT. A pigtail drainage catheter is noted within the pelvis in similar position. No drainable fluid collection identified. Musculoskeletal: There is osteopenia with degenerative changes of the spine. L3-L5 disc spacer and posterior fusion. There is disc desiccation and vacuum phenomena at L5-S1. No acute osseous pathology. IMPRESSION: 1. Partially visualized right lung base atelectasis or pneumonia. Clinical correlation is recommended. 2. Retained contrast within the renal parenchyma  concerning for  acute renal insufficiency. There is heterogeneous and striated nephrogram which may represent pyelonephritis. Correlation with urinalysis recommended. 3. Mild ileus.  No definite obstruction. 4. Colonic diverticulosis. 5. Overall decrease in the inflammatory changes and stranding compared to the prior CT. A pigtail drainage catheter within the pelvis is similar position. No drainable fluid collection identified. 6. Aortic Atherosclerosis (ICD10-I70.0). Electronically Signed   By: Anner Crete M.D.   On: 04/18/2020 17:59    Anti-infectives: Anti-infectives (From admission, onward)   Start     Dose/Rate Route Frequency Ordered Stop   04/18/20 2000  levofloxacin (LEVAQUIN) IVPB 750 mg        750 mg 100 mL/hr over 90 Minutes Intravenous  Once 04/18/20 1857 04/19/20 0004   04/18/20 1800  levofloxacin (LEVAQUIN) IVPB 750 mg  Status:  Discontinued        750 mg 100 mL/hr over 90 Minutes Intravenous  Once 04/18/20 1640 04/18/20 1857   04/16/20 2200  anidulafungin (ERAXIS) 100 mg in sodium chloride 0.9 % 100 mL IVPB        100 mg 78 mL/hr over 100 Minutes Intravenous Every 24 hours 04/16/20 1707     04/15/20 2200  valACYclovir (VALTREX) tablet 500 mg  Status:  Discontinued        500 mg Oral Daily at bedtime 04/15/20 1248 04/16/20 1259   04/15/20 2200  anidulafungin (ERAXIS) 200 mg in sodium chloride 0.9 % 200 mL IVPB        200 mg 78 mL/hr over 200 Minutes Intravenous  Once 04/15/20 2008 04/16/20 0327   04/15/20 2200  meropenem (MERREM) 1 g in sodium chloride 0.9 % 100 mL IVPB        1 g 200 mL/hr over 30 Minutes Intravenous Every 12 hours 04/15/20 2017     04/15/20 1400  piperacillin-tazobactam (ZOSYN) IVPB 3.375 g  Status:  Discontinued        3.375 g 12.5 mL/hr over 240 Minutes Intravenous Every 8 hours 04/15/20 1300 04/15/20 2006   04/14/20 2200  elvitegravir-cobicistat-emtricitabine-tenofovir (GENVOYA) 150-150-200-10 MG tablet 1 tablet  Status:  Discontinued        1 tablet  Oral Daily at bedtime 04/14/20 0235 04/15/20 0957   04/14/20 2200  valACYclovir (VALTREX) tablet 1,000 mg  Status:  Discontinued        1,000 mg Oral Daily at bedtime 04/14/20 0235 04/15/20 1248   04/14/20 1230  cefTRIAXone (ROCEPHIN) 2 g in sodium chloride 0.9 % 100 mL IVPB  Status:  Discontinued        2 g 200 mL/hr over 30 Minutes Intravenous Every 24 hours 04/14/20 1223 04/15/20 1300   04/14/20 1230  metroNIDAZOLE (FLAGYL) IVPB 500 mg  Status:  Discontinued        500 mg 100 mL/hr over 60 Minutes Intravenous Every 8 hours 04/14/20 1223 04/15/20 1636   04/14/20 1030  piperacillin-tazobactam (ZOSYN) IVPB 3.375 g  Status:  Discontinued        3.375 g 12.5 mL/hr over 240 Minutes Intravenous Every 8 hours 04/14/20 0235 04/14/20 1223   04/14/20 0215  piperacillin-tazobactam (ZOSYN) IVPB 4.5 g  Status:  Discontinued        4.5 g 200 mL/hr over 30 Minutes Intravenous  Once 04/14/20 0200 04/14/20 0208   04/14/20 0215  piperacillin-tazobactam (ZOSYN) IVPB 3.375 g        3.375 g 100 mL/hr over 30 Minutes Intravenous  Once 04/14/20 0208 04/14/20 0301      Assessment/Plan:  Diverticulitis and abscess improving,  keep antibiotics keep drain.  No need for surgical intervention.  All medical issues per primary team.  Remains poor prognosis due to significant comorbidities  [name] Hamman, MD, [facility]  04/20/2020

## 2020-04-20 NOTE — Progress Notes (Signed)
[address] [place], [place] [address] Phone [phone]. Fax [phone]  Date: 04/20/2020                  Patient Name:  Melanie Bowen  MRN: [id number]  DOB: [birth year]  Age / Sex: 59 y.o., female         PCP: Revelo, Elyse Jarvis, MD                 Service Requesting Consult: IM/ Deatra James, MD                 Reason for Consult: ARF            History of Present Illness: Patient is a 59 y.o. female with medical problems of HIV, COPD, HTN, who was admitted to [facility] on 04/14/2020 for evaluation of abdominal pain.  Patient presented to the ER August 6 for abdominal pain.  There was question of diverticulitis versus possible UTI.  She was discharged on oral antibiotics but presented again to the ER for worsening abdominal pain. CT with IV contrast on August 16 showed findings consistent with acute sigmoid colon diverticulitis with gas and fluid collection suspicious for contained perforation or abscess.  CT-guided percutaneous drain was placed on August 16 by vascular interventional radiology.  40 mL of foul-smelling gray-colored fluid was removed along with gas.  Repeat CT with IV contrast same day was done for assessment which showed interval percutaneous drainage of pelvic abscess heterogeneous enhancement of both kidneys with minimal excretion for both kidneys on delayed phase imaging  Patient appears to be more alert today Not agitated.  Able to interact and answer questions Able to tolerate soft diet  08/21 0701 - 08/22 0700 In: 1145 [I.V.:815; IV Piggyback:330] Out: 280 [Urine:280]  Lab Results  Component Value Date   CREATININE 3.89 (H) 04/19/2020   CREATININE 4.21 (H) 04/18/2020   CREATININE 4.51 (H) 04/17/2020   Urine output is low but serum creatinine has started to improve spontaneously   Vital Signs: Blood pressure (!) 118/96, pulse 99, temperature 97.9 F (36.6 C), temperature source Oral, resp. rate (!) 23, height 5\' 6"  (1.676  m), weight 121.6 kg, SpO2 100 %.   Intake/Output Summary (Last 24 hours) at 04/20/2020 1010 Last data filed at 04/20/2020 0743 Gross per 24 hour  Intake 1117.29 ml  Output 249 ml  Net 868.29 ml    Weight trends: [facility] Weights   04/13/20 2127 04/14/20 1822  Weight: 117.9 kg 121.6 kg    Physical Exam: General:  Critically ill-looking, lethargic,opens eyes to call  HEENT  moist oral mucous membranes  Lungs:   O2   , normal respiratory effort, decreased breath sounds at bases  Heart::  Regular, tachycardic  Abdomen:  Soft, nondistended, LLQ JP drain with minimal output  Extremities:  No edema  Neurologic:  Alert, able to answer simple questions  Skin:  Warm and dry, no acute rashes  Access: Rt Femoral   Foley:  Foley catheter in place         Lab results: Basic Metabolic Panel: Recent Labs  Lab 04/17/20 0603 04/18/20 0442 04/19/20 0848  NA 136 136 136  K 4.9 4.8 5.5*  CL 102 101 100  CO2 17* 20* 18*  GLUCOSE 110* 126* 143*  BUN 63* 63* 61*  CREATININE 4.51* 4.21* 3.89*  CALCIUM 6.9* 7.3* 8.2*  MG 1.7 1.9 1.9  PHOS 5.3* 5.9* 6.2*    Liver Function Tests: Recent Labs  Lab  04/19/20 0848  AST 94*  ALT 419*  ALKPHOS 113  BILITOT 1.2  PROT 5.2*  ALBUMIN 1.9*   Recent Labs  Lab 04/13/20 2140  LIPASE 25   No results for input(s): AMMONIA in the last 168 hours.  CBC: Recent Labs  Lab 04/14/20 0648 04/14/20 0813 04/14/20 1245 04/15/20 0609 04/18/20 0442 04/19/20 0458  WBC   < > 5.1 7.4   < > 24.8* 24.7*  NEUTROABS  --  4.2 6.1  --   --   --   HGB   < > 14.0 13.7   < > 12.0 11.5*  HCT   < > 42.5 41.7   < > 32.4* 31.3*  MCV   < > 92 92.3   < > 83.5 82.2  PLT   < > 333 294   < > 31* 36*   < > = values in this interval not displayed.    Cardiac Enzymes: No results for input(s): CKTOTAL, TROPONINI in the last 168 hours.  BNP: Invalid input(s): POCBNP  CBG: Recent Labs  Lab 04/14/20 1828  GLUCAP 51    Microbiology: Recent Results (from  the past 720 hour(s))  SARS Coronavirus 2 by RT PCR (hospital order, performed in [facility] hospital lab) Nasopharyngeal Nasopharyngeal Swab     Status: None   Collection Time: 04/14/20  2:17 AM   Specimen: Nasopharyngeal Swab  Result Value Ref Range Status   SARS Coronavirus 2 NEGATIVE NEGATIVE Final    Comment: (NOTE) SARS-CoV-2 target nucleic acids are NOT DETECTED.  The SARS-CoV-2 RNA is generally detectable in upper and lower respiratory specimens during the acute phase of infection. The lowest concentration of SARS-CoV-2 viral copies this assay can detect is 250 copies / mL. A negative result does not preclude SARS-CoV-2 infection and should not be used as the sole basis for treatment or other patient management decisions.  A negative result may occur with improper specimen collection / handling, submission of specimen other than nasopharyngeal swab, presence of viral mutation(s) within the areas targeted by this assay, and inadequate number of viral copies (<250 copies / mL). A negative result must be combined with clinical observations, patient history, and epidemiological information.  Fact Sheet for Patients:   StrictlyIdeas.no  Fact Sheet for Healthcare Providers: BankingDealers.co.za  This test is not yet approved or  cleared by the [place] FDA and has been authorized for detection and/or diagnosis of SARS-CoV-2 by FDA under an Emergency Use Authorization (EUA).  This EUA will remain in effect (meaning this test can be used) for the duration of the COVID-19 declaration under Section 564(b)(1) of the Act, 21 U.S.C. section 360bbb-3(b)(1), unless the authorization is terminated or revoked sooner.  Performed at [facility], [address] [place], [place] [address]   CULTURE, BLOOD (ROUTINE X 2) w Reflex to ID Panel     Status: None   Collection Time: 04/14/20  8:14 AM   Specimen: BLOOD  Result Value  Ref Range Status   Specimen Description BLOOD LEFT [facility]  Final   Special Requests   Final    BOTTLES DRAWN AEROBIC AND ANAEROBIC Blood Culture adequate volume   Culture   Final    NO GROWTH 5 DAYS Performed at [facility], [address], [place],  [address]    Report Status 04/19/2020 FINAL  Final  CULTURE, BLOOD (ROUTINE X 2) w Reflex to ID Panel     Status: None   Collection Time: 04/14/20  8:14  AM   Specimen: BLOOD  Result Value Ref Range Status   Specimen Description BLOOD LEFT WRIST  Final   Special Requests   Final    BOTTLES DRAWN AEROBIC AND ANAEROBIC Blood Culture adequate volume   Culture   Final    NO GROWTH 5 DAYS Performed at [facility], [address] [place], [place] [address]    Report Status 04/19/2020 FINAL  Final  Aerobic/Anaerobic Culture (surgical/deep wound)     Status: None (Preliminary result)   Collection Time: 04/14/20 11:14 AM   Specimen: Abscess  Result Value Ref Range Status   Specimen Description   Final    ABSCESS Performed at [facility], [address], [place], [place] [address]    Special Requests   Final    NONE Performed at [facility], [place], [place] [address]    Gram Stain   Final    NO WBC SEEN ABUNDANT GRAM NEGATIVE RODS FEW GRAM POSITIVE COCCI FEW GRAM POSITIVE RODS Performed at [facility], [place] [address] [place], [place] [address]    Culture   Final    ABUNDANT KLEBSIELLA PNEUMONIAE ABUNDANT PSEUDOMONAS AERUGINOSA MODERATE ESCHERICHIA COLI MIXED ANAEROBIC FLORA PRESENT.  CALL LAB IF FURTHER IID REQUIRED. SUSCEPTIBILITIES TO FOLLOW ESCHERICHIA COLI    Report Status PENDING  Incomplete   Organism ID, Bacteria KLEBSIELLA PNEUMONIAE  Final   Organism ID, Bacteria PSEUDOMONAS AERUGINOSA  Final      Susceptibility   Klebsiella pneumoniae - MIC*    AMPICILLIN >=32 RESISTANT Resistant     CEFAZOLIN <=4 SENSITIVE Sensitive     CEFEPIME <=0.12 SENSITIVE  Sensitive     CEFTAZIDIME <=1 SENSITIVE Sensitive     CEFTRIAXONE <=0.25 SENSITIVE Sensitive     CIPROFLOXACIN <=0.25 SENSITIVE Sensitive     GENTAMICIN <=1 SENSITIVE Sensitive     IMIPENEM <=0.25 SENSITIVE Sensitive     TRIMETH/SULFA <=20 SENSITIVE Sensitive     AMPICILLIN/SULBACTAM 16 INTERMEDIATE Intermediate     PIP/TAZO 8 SENSITIVE Sensitive     * ABUNDANT KLEBSIELLA PNEUMONIAE   Pseudomonas aeruginosa - MIC*    CEFTAZIDIME 4 SENSITIVE Sensitive     CIPROFLOXACIN <=0.25 SENSITIVE Sensitive     GENTAMICIN 2 SENSITIVE Sensitive     IMIPENEM 2 SENSITIVE Sensitive     PIP/TAZO 8 SENSITIVE Sensitive     CEFEPIME 4 SENSITIVE Sensitive     * ABUNDANT PSEUDOMONAS AERUGINOSA  MRSA PCR Screening     Status: None   Collection Time: 04/14/20  6:19 PM   Specimen: Nasopharyngeal  Result Value Ref Range Status   MRSA by PCR NEGATIVE NEGATIVE Final    Comment:        The GeneXpert MRSA Assay (FDA approved for NASAL specimens only), is one component of a comprehensive MRSA colonization surveillance program. It is not intended to diagnose MRSA infection nor to guide or monitor treatment for MRSA infections. Performed at [facility], [address], [place], [place] [address]   Urine Culture     Status: None   Collection Time: 04/15/20  4:28 AM   Specimen: Urine, Random  Result Value Ref Range Status   Specimen Description   Final    URINE, RANDOM Performed at [facility], [address] [place], [place] [address]    Special Requests   Final    NONE Performed at [facility], [address] [place],  [address]    Culture   Final  NO GROWTH Performed at [facility], [place] [address], [place], [place] [address]    Report Status 04/16/2020 FINAL  Final     Coagulation Studies: No results for input(s): LABPROT, INR in the last 72 hours.  Urinalysis: No results for input(s): COLORURINE, LABSPEC, PHURINE, GLUCOSEU, HGBUR,  BILIRUBINUR, KETONESUR, PROTEINUR, UROBILINOGEN, NITRITE, LEUKOCYTESUR in the last 72 hours.  Invalid input(s): APPERANCEUR      Imaging: CT ABDOMEN PELVIS WO CONTRAST  Result Date: 04/18/2020 CLINICAL DATA:  58 year old female with concern for abdominal abscess or infection. EXAM: CT ABDOMEN AND PELVIS WITHOUT CONTRAST TECHNIQUE: Multidetector CT imaging of the abdomen and pelvis was performed following the standard protocol without IV contrast. COMPARISON:  CT abdomen pelvis dated 04/14/2020. FINDINGS: Evaluation of this exam is limited due to respiratory motion artifact. Lower chest: Partially visualized clusters of ground-glass opacity primarily involving the right lung base may represent areas of atelectasis but concerning for pneumonia. Clinical correlation is recommended. No intra-abdominal free air. There is a small free fluid within the pelvis. Hepatobiliary: The liver is unremarkable. No intrahepatic biliary ductal dilatation. High attenuating content within the gallbladder, likely vicarious excretion of contrast. Pancreas: Unremarkable. No pancreatic ductal dilatation or surrounding inflammatory changes. Spleen: Normal in size without focal abnormality. Adrenals/Urinary Tract: The adrenal glands unremarkable. Retained contrast from prior CT within the renal parenchyma consistent with delayed clearance and acute renal insufficiency. There is striated renal parenchyma concerning for pyelonephritis. Correlation with urinalysis recommended. There is no hydronephrosis. The visualized ureters appear unremarkable. The urinary bladder is decompressed around a Foley catheter. Stomach/Bowel: There is distal colonic and sigmoid diverticulosis without active inflammatory changes. Several mildly dilated small bowel loops measuring up to 3.4 cm in caliber without a transition most likely an ileus. Oral contrast traverses into the cecum. The appendix is normal. Vascular/Lymphatic: Advanced aortoiliac  atherosclerotic disease. The IVC is unremarkable. No portal venous gas. There is no adenopathy. Reproductive: The uterus is grossly unremarkable. Other: Diffuse edema of the pelvic floor with small free fluid. Overall decrease in the inflammatory changes and stranding compared to the prior CT. A pigtail drainage catheter is noted within the pelvis in similar position. No drainable fluid collection identified. Musculoskeletal: There is osteopenia with degenerative changes of the spine. L3-L5 disc spacer and posterior fusion. There is disc desiccation and vacuum phenomena at L5-S1. No acute osseous pathology. IMPRESSION: 1. Partially visualized right lung base atelectasis or pneumonia. Clinical correlation is recommended. 2. Retained contrast within the renal parenchyma concerning for acute renal insufficiency. There is heterogeneous and striated nephrogram which may represent pyelonephritis. Correlation with urinalysis recommended. 3. Mild ileus.  No definite obstruction. 4. Colonic diverticulosis. 5. Overall decrease in the inflammatory changes and stranding compared to the prior CT. A pigtail drainage catheter within the pelvis is similar position. No drainable fluid collection identified. 6. Aortic Atherosclerosis (ICD10-I70.0). Electronically Signed   By: Anner Crete M.D.   On: 04/18/2020 17:59    Scheduled Meds: . budesonide (PULMICORT) nebulizer solution  0.5 mg Nebulization BID  . Chlorhexidine Gluconate Cloth  6 each Topical Daily  . guaiFENesin-dextromethorphan  10 mL Oral Q8H  . ipratropium-albuterol  3 mL Nebulization Q4H  . methylPREDNISolone (SOLU-MEDROL) injection  40 mg Intravenous Q6H  . mometasone-formoterol  2 puff Inhalation BID  . pantoprazole (PROTONIX) IV  40 mg Intravenous Daily  . sodium chloride flush  5 mL Intracatheter Q8H   Continuous Infusions: . sodium chloride 10 mL/hr at 04/16/20 1200  . amiodarone 60 mg/hr (04/20/20  5366)  . anidulafungin Stopped (04/19/20 2255)   . meropenem (MERREM) IV 1 g (04/20/20 0949)  . norepinephrine (LEVOPHED) Adult infusion     PRN Meds:.acetaminophen **OR** acetaminophen, ALPRAZolam, heparin NICU/SCN flush, HYDROcodone-acetaminophen, HYDROmorphone (DILAUDID) injection, ipratropium-albuterol, labetalol, magnesium hydroxide   Assessment & Plan: Pt is a 59 y.o. African American  female with HIV disease, COPD, hypertension, was admitted on 04/14/2020 with Dyspnea [R06.00] Colonic diverticular abscess [K57.20] Diverticulitis of large intestine with abscess without bleeding [K57.20] Abscess of sigmoid colon due to diverticulitis [K57.20]   #Acute kidney injury #Severe sepsis from perforated sigmoid diverticulitis with abscess status post percutaneous drainage on August 16 #Shortness of breath, likely acute pulmonary edema #Hyperkalemia   -AKI is likely ATN caused by multiple factors including severe sepsis and multiple IV contrast exposures on August 16 for CT abdomen.  Patient is currently oliguric,   - 1st HD on 8/18 Patient underwent hemodialysis on 18, 19, 20  More alert today. Electrolytes and volume status are acceptable.  No indication for dialysis today Serum creatinine is starting to improve spontaneously without dialysis -Lokelma for hyperkalemia -Once urine output is satisfactory, can consider IV albumin supplementation We will continue to monitor daily    Discussed with ICU team    LOS: 6 Karne Ozga 8/22/202110:10 AM    Note: This note was prepared with Dragon dictation. Any transcription errors are unintentional

## 2020-04-21 DIAGNOSIS — R0609 Other forms of dyspnea: Secondary | ICD-10-CM

## 2020-04-21 DIAGNOSIS — R6521 Severe sepsis with septic shock: Secondary | ICD-10-CM | POA: Diagnosis not present

## 2020-04-21 DIAGNOSIS — A419 Sepsis, unspecified organism: Secondary | ICD-10-CM | POA: Diagnosis not present

## 2020-04-21 DIAGNOSIS — N179 Acute kidney failure, unspecified: Secondary | ICD-10-CM | POA: Diagnosis not present

## 2020-04-21 DIAGNOSIS — K5720 Diverticulitis of large intestine with perforation and abscess without bleeding: Secondary | ICD-10-CM | POA: Diagnosis not present

## 2020-04-21 LAB — HEPATITIS B CORE ANTIBODY, TOTAL: Hep B Core Total Ab: NONREACTIVE

## 2020-04-21 LAB — HEPATITIS B SURFACE ANTIGEN: Hepatitis B Surface Ag: NONREACTIVE

## 2020-04-21 LAB — HEPATITIS B SURFACE ANTIBODY,QUALITATIVE: Hep B S Ab: REACTIVE — AB

## 2020-04-21 LAB — HEPATITIS C ANTIBODY: HCV Ab: NONREACTIVE

## 2020-04-21 MED ORDER — LIDOCAINE VISCOUS HCL 2 % MT SOLN
15.0000 mL | Freq: Once | OROMUCOSAL | Status: AC
  Administered 2020-04-21: 15 mL via ORAL
  Filled 2020-04-21: qty 15

## 2020-04-21 MED ORDER — ALUM & MAG HYDROXIDE-SIMETH 200-200-20 MG/5ML PO SUSP
30.0000 mL | Freq: Once | ORAL | Status: AC
  Administered 2020-04-21: 30 mL via ORAL
  Filled 2020-04-21: qty 30

## 2020-04-21 MED ORDER — RISAQUAD PO CAPS
1.0000 | ORAL_CAPSULE | Freq: Every day | ORAL | Status: DC
  Administered 2020-04-21 – 2020-05-19 (×21): 1 via ORAL
  Filled 2020-04-21 (×22): qty 1

## 2020-04-21 MED ORDER — AMIODARONE HCL 200 MG PO TABS
400.0000 mg | ORAL_TABLET | Freq: Two times a day (BID) | ORAL | Status: DC
  Administered 2020-04-21 – 2020-04-24 (×6): 400 mg via ORAL
  Filled 2020-04-21 (×6): qty 2

## 2020-04-21 MED ORDER — PHENOL 1.4 % MT LIQD
1.0000 | OROMUCOSAL | Status: DC | PRN
  Administered 2020-04-22 – 2020-05-17 (×3): 1 via OROMUCOSAL
  Filled 2020-04-21: qty 177

## 2020-04-21 NOTE — Progress Notes (Signed)
Dr. Damita Dunnings notified that patient is requesting her home medications. Acknowledged, no new orders at this time. Per Dr. Damita Dunnings the patient is not able to take home medications at this time due to multiorgan failure from her septic shock and that medications may be restarted after she recovers a bit more. Dr. Damita Dunnings recommended that the patient talk about it further with her doctors in the morning. Patient and family updated, all questions answered.

## 2020-04-21 NOTE — Progress Notes (Signed)
Patient Name: Melanie Bowen Date of Encounter: 04/21/2020  Hospital Problem List     Principal Problem:   Severe sepsis with septic shock ([place]) Active Problems:   HTN (hypertension)   HIV (human immunodeficiency virus infection) ([place])   Diverticulitis of colon   Abscess of sigmoid colon due to diverticulitis   Acute renal failure (ARF) ([place])   Hypotension   Diverticulosis   Acute respiratory distress   DNR (do not resuscitate) discussion   Palliative care by specialist    Patient Profile     59 year old female with COPD, hypertension, diverticulosis, HIV, gastroesophageal reflux disease who presented with abdominal pain nausea and was noted to have a pelvic abscess. She had a CT-guided drainage and has a drain in place. She had atrial fibrillation rapid ventricular response which is a difficult control. Had acute renal insufficiency as well as elevated transaminases.  Subjective   Feels a lot better this am. Less sob  Inpatient Medications    . budesonide (PULMICORT) nebulizer solution  0.5 mg Nebulization BID  . Chlorhexidine Gluconate Cloth  6 each Topical Daily  . guaiFENesin-dextromethorphan  10 mL Oral Q8H  . ipratropium-albuterol  3 mL Nebulization Q4H  . methylPREDNISolone (SOLU-MEDROL) injection  40 mg Intravenous Q6H  . mometasone-formoterol  2 puff Inhalation BID  . pantoprazole (PROTONIX) IV  40 mg Intravenous Daily  . sodium chloride flush  5 mL Intracatheter Q8H  . sodium zirconium cyclosilicate  10 g Oral Daily    Vital Signs    Vitals:   04/21/20 0300 04/21/20 0400 04/21/20 0500 04/21/20 0600  BP: (!) 110/53 116/62 (!) 107/49 112/67  Pulse: 86 88 92 94  Resp: 13 14 16  (!) 22  Temp:      TempSrc:      SpO2: 95% 94% 95% 95%  Weight:      Height:        Intake/Output Summary (Last 24 hours) at 04/21/2020 0711 Last data filed at 04/21/2020 0600 Gross per 24 hour  Intake 1088.22 ml  Output 194 ml  Net 894.22 ml   Filed Weights   04/13/20  2127 04/14/20 1822  Weight: 117.9 kg 121.6 kg    Physical Exam    GEN: Well nourished, well developed, in no acute distress.  HEENT: normal.  Neck: Supple, no JVD, carotid bruits, or masses. Cardiac: RRR, no murmurs, rubs, or gallops. No clubbing, cyanosis, edema.  Radials/DP/PT 2+ and equal bilaterally.  Respiratory:  Respirations regular and unlabored, clear to auscultation bilaterally. GI: Soft, nontender, nondistended, BS + x 4. MS: no deformity or atrophy. Skin: warm and dry, no rash. Neuro:  Strength and sensation are intact. Psych: Normal affect.  Labs    CBC Recent Labs    04/19/20 0458  WBC 24.7*  HGB 11.5*  HCT 31.3*  MCV 82.2  PLT 36*   Basic Metabolic Panel Recent Labs    04/19/20 0848  NA 136  K 5.5*  CL 100  CO2 18*  GLUCOSE 143*  BUN 61*  CREATININE 3.89*  CALCIUM 8.2*  MG 1.9  PHOS 6.2*   Liver Function Tests Recent Labs    04/19/20 0848  AST 94*  ALT 419*  ALKPHOS 113  BILITOT 1.2  PROT 5.2*  ALBUMIN 1.9*   No results for input(s): LIPASE, AMYLASE in the last 72 hours. Cardiac Enzymes No results for input(s): CKTOTAL, CKMB, CKMBINDEX, TROPONINI in the last 72 hours. BNP No results for input(s): BNP in the last 72 hours. D-Dimer  No results for input(s): DDIMER in the last 72 hours. Hemoglobin A1C No results for input(s): HGBA1C in the last 72 hours. Fasting Lipid Panel No results for input(s): CHOL, HDL, LDLCALC, TRIG, CHOLHDL, LDLDIRECT in the last 72 hours. Thyroid Function Tests No results for input(s): TSH, T4TOTAL, T3FREE, THYROIDAB in the last 72 hours.  Invalid input(s): FREET3  Telemetry    nsr  ECG       Radiology    CT ABDOMEN PELVIS WO CONTRAST  Result Date: 04/18/2020 CLINICAL DATA:  59 year old female with concern for abdominal abscess or infection. EXAM: CT ABDOMEN AND PELVIS WITHOUT CONTRAST TECHNIQUE: Multidetector CT imaging of the abdomen and pelvis was performed following the Bowen protocol  without IV contrast. COMPARISON:  CT abdomen pelvis dated 04/14/2020. FINDINGS: Evaluation of this exam is limited due to respiratory motion artifact. Lower chest: Partially visualized clusters of ground-glass opacity primarily involving the right lung base may represent areas of atelectasis but concerning for pneumonia. Clinical correlation is recommended. No intra-abdominal free air. There is a small free fluid within the pelvis. Hepatobiliary: The liver is unremarkable. No intrahepatic biliary ductal dilatation. High attenuating content within the gallbladder, likely vicarious excretion of contrast. Pancreas: Unremarkable. No pancreatic ductal dilatation or surrounding inflammatory changes. Spleen: Normal in size without focal abnormality. Adrenals/Urinary Tract: The adrenal glands unremarkable. Retained contrast from prior CT within the renal parenchyma consistent with delayed clearance and acute renal insufficiency. There is striated renal parenchyma concerning for pyelonephritis. Correlation with urinalysis recommended. There is no hydronephrosis. The visualized ureters appear unremarkable. The urinary bladder is decompressed around a Foley catheter. Stomach/Bowel: There is distal colonic and sigmoid diverticulosis without active inflammatory changes. Several mildly dilated small bowel loops measuring up to 3.4 cm in caliber without a transition most likely an ileus. Oral contrast traverses into the cecum. The appendix is normal. Vascular/Lymphatic: Advanced aortoiliac atherosclerotic disease. The IVC is unremarkable. No portal venous gas. There is no adenopathy. Reproductive: The uterus is grossly unremarkable. Other: Diffuse edema of the pelvic floor with small free fluid. Overall decrease in the inflammatory changes and stranding compared to the prior CT. A pigtail drainage catheter is noted within the pelvis in similar position. No drainable fluid collection identified. Musculoskeletal: There is osteopenia  with degenerative changes of the spine. L3-L5 disc spacer and posterior fusion. There is disc desiccation and vacuum phenomena at L5-S1. No acute osseous pathology. IMPRESSION: 1. Partially visualized right lung base atelectasis or pneumonia. Clinical correlation is recommended. 2. Retained contrast within the renal parenchyma concerning for acute renal insufficiency. There is heterogeneous and striated nephrogram which may represent pyelonephritis. Correlation with urinalysis recommended. 3. Mild ileus.  No definite obstruction. 4. Colonic diverticulosis. 5. Overall decrease in the inflammatory changes and stranding compared to the prior CT. A pigtail drainage catheter within the pelvis is similar position. No drainable fluid collection identified. 6. Aortic Atherosclerosis (ICD10-I70.0). Electronically Signed   By: Anner Crete M.D.   On: 04/18/2020 17:59   CT ABDOMEN PELVIS WO CONTRAST  Result Date: 04/14/2020 CLINICAL DATA:  Abdominal pain. Contained perforated acute diverticulitis. EXAM: CT ABDOMEN AND PELVIS WITHOUT CONTRAST TECHNIQUE: Multidetector CT imaging of the abdomen and pelvis was performed following the Bowen protocol without IV contrast. COMPARISON:  CT 04/14/2020, 01/31/2019 FINDINGS: Lower chest: Lung bases are clear. Hepatobiliary: No focal hepatic lesion. No biliary duct dilatation. Common bile duct is normal. Pancreas: Pancreas is normal. No ductal dilatation. No pancreatic inflammation. Spleen: Normal spleen Adrenals/urinary tract: Adrenal glands and kidneys are normal.  The ureters and bladder normal. Stomach/Bowel: Stomach duodenum normal. Mildly thickened loops small bowel in the pelvis is favored secondary inflammation related to pelvic abscess. Appendix is partially imaged (image 61/2) appears normal. The ascending and transverse colon normal. There are diverticula descending colon and sigmoid colon. Diverticular disease is heavy in the proximal sigmoid colon. There is a  thick-walled fluid collection with the an air-fluid level measuring 8.1 x 4.7 cm in the central anterior upper pelvis. This is adjacent to the sigmoid colon. There is a thin tract between the colon in the fluid collection on image 71/2. Similar pericolonic abscess seen on CT 01/17/2019. Findings most consistent with contained sigmoid perforation with abscess formation. No intraperitoneal free.  Minimal fluid in the pelvis. Vascular/Lymphatic: Abdominal aorta is normal caliber with atherosclerotic calcification. There is no retroperitoneal or periportal lymphadenopathy. No pelvic lymphadenopathy. Reproductive: Post hysterectomy.  Adnexa unremarkable Other: Small volume free fluid the pelvis Musculoskeletal: Posterior lumbar fusion IMPRESSION: 1. No significant interval change in short interval follow-up. 2. Contained sigmoid colon perforation with abscess formation. Perforation presumed related to diverticulitis. Findings similar to May 2020 with increased volume of contained perforation. 3. No intraperitoneal free air. 4. Secondary inflammation of the small bowel. Electronically Signed   By: Suzy Bouchard M.D.   On: 04/14/2020 08:08   DG Chest 1 View  Result Date: 04/14/2020 CLINICAL DATA:  Dyspnea EXAM: CHEST  1 VIEW COMPARISON:  11/06/2019 FINDINGS: Mildly low lung volumes. There is no edema, consolidation, effusion, or pneumothorax. Normal heart size and mediastinal contours. Artifact from EKG leads IMPRESSION: No evidence of acute disease. Electronically Signed   By: Monte Fantasia M.D.   On: 04/14/2020 07:17   DG Abd 1 View  Result Date: 04/14/2020 CLINICAL DATA:  Abdominal pain.  Pelvic abscess drainage today. EXAM: ABDOMEN - 1 VIEW COMPARISON:  CT abdomen pelvis from same day. FINDINGS: The bowel gas pattern is normal. No intraperitoneal free air peer no radio-opaque calculi or other significant radiographic abnormality are seen. No acute osseous abnormality. Prior lumbar fusion. IMPRESSION: 1.  Negative. Electronically Signed   By: Titus Dubin M.D.   On: 04/14/2020 14:37   CT ABDOMEN PELVIS W CONTRAST  Result Date: 04/14/2020 CLINICAL DATA:  Peritonitis or perforation suspected Patient with history of perforated diverticulitis post percutaneous drain placement today. EXAM: CT ABDOMEN AND PELVIS WITH CONTRAST TECHNIQUE: Multidetector CT imaging of the abdomen and pelvis was performed using the Bowen protocol following bolus administration of intravenous contrast. CONTRAST:  81mL OMNIPAQUE IOHEXOL 300 MG/ML  SOLN COMPARISON:  Two prior abdominopelvic CT earlier this day, prior to drain placement. FINDINGS: Lower chest: Small pleural effusions are new from prior exam. Adjacent compressive atelectasis. Breathing motion artifact in the lung bases limits assessment. Hepatobiliary: Small amount of perihepatic fluid slightly increased from prior exam. This appears mildly complex. High-density contents in the urinary bladder increasing likely vicarious excretion of IV contrast. There is no discrete focal hepatic abnormality allowing for motion. Pancreas: Motion artifact through the pancreas. No obvious inflammation. Spleen: Normal allowing for motion. Adrenals/Urinary Tract: No adrenal nodule. There is heterogeneous enhancement of both kidneys. No significant perinephric edema. Minimal excretion from both kidneys on delayed phase imaging. There is excreted IV contrast within the urinary bladder. Stomach/Bowel: New surgical drain within the pelvic fluid collection. No definite residual fluid. A drain is closely approximated to fecalized loops of small bowel in the pelvis. Motion artifact limits detailed bowel assessment. Similar appearance of scattered small bowel thickening from prior exam which  is likely reactive. There is no evidence of obstruction. No bowel pneumatosis. Diverticulosis involving the colon. No new colonic inflammation or diverticulitis. Air-fluid level in the stomach with small amount  of fluid in the distal esophagus. Vascular/Lymphatic: Aortic atherosclerosis. No aortic aneurysm. No evidence of portal venous or mesenteric gas. No bulky adenopathy. Reproductive: Uterus and bilateral adnexa are unremarkable. Other: Surgical drain within the pelvic fluid collection which appears completely decompressed. No definite residual collection. Generalized fat stranding involving the lower small bowel mesentery. Small amount of non organized free fluid in the dependent pelvis, tracking into the pericolic gutters and right upper quadrant. There is no definite free air. Musculoskeletal: Stable. Postsurgical change in the lower lumbar spine. IMPRESSION: 1. Interval percutaneous drainage of pelvic abscess. No residual fluid collection. 2. Small amount of non organized free fluid in the pelvis, pericolic gutters, and perihepatic right upper quadrant, slight increase from pre drainage CT earlier today. No evidence of new abscess. No free air or interval perforation. 3. Heterogeneous enhancement of both kidneys, nonspecific but can be seen with pyelonephritis. 4. Small pleural effusions are new from prior exam. Adjacent compressive atelectasis. Electronically Signed   By: Keith Rake M.D.   On: 04/14/2020 22:22   CT Abdomen Pelvis W Contrast  Result Date: 04/14/2020 CLINICAL DATA:  Lower abdominal pain EXAM: CT ABDOMEN AND PELVIS WITH CONTRAST TECHNIQUE: Multidetector CT imaging of the abdomen and pelvis was performed using the Bowen protocol following bolus administration of intravenous contrast. CONTRAST:  166mL OMNIPAQUE IOHEXOL 300 MG/ML  SOLN COMPARISON:  01/31/2019 FINDINGS: Lower chest: Lung bases demonstrate no acute consolidation or pleural effusion. Normal cardiac size. Hepatobiliary: No focal liver abnormality is seen. No gallstones, gallbladder wall thickening, or biliary dilatation. Pancreas: Unremarkable. No pancreatic ductal dilatation or surrounding inflammatory changes. Spleen: Normal  in size without focal abnormality. Adrenals/Urinary Tract: Adrenal glands are unremarkable. Kidneys are normal, without renal calculi, focal lesion, or hydronephrosis. Bladder is unremarkable. Stomach/Bowel: The stomach is nonenlarged. No dilated small bowel. Mildly thickened loops of small bowel within the pelvis, likely reactive. Negative appendix. Sigmoid colon diverticular disease. Wall thickening and mild inflammatory change at the distal sigmoid colon consistent with diverticulitis. 7.5 x 4.4 cm gas and fluid collection within the anterior pelvis suspicious for contained perforation/abscess. Soft tissue inflammatory density extends from inflamed sigmoid colon to the gas and fluid collection, sagittal series 6, image number 96. Vascular/Lymphatic: Moderate aortic atherosclerosis. No aneurysm. No suspicious nodes. Reproductive: Uterus and bilateral adnexa are unremarkable. Other: No free air. Small free fluid in the pelvis. Small amount of upper abdominal ascites. Generalized soft tissue stranding within the pelvis consistent with inflammatory process. Musculoskeletal: Postsurgical changes of the lumbar spine with surgical rod and fixating screws L3 through L5. No acute osseous abnormality IMPRESSION: 1. Findings consistent with acute sigmoid colon diverticulitis. 7.5 x 4.4 cm gas and fluid collection within the anterior pelvis suspicious for contained perforation/abscess, this appears to communicate with the thickened inflamed sigmoid colon. 2. Small amount of upper abdominal and pelvic ascites. 3. Thickened appearance of pelvic small bowel loops, likely reactive Aortic Atherosclerosis (ICD10-I70.0). Electronically Signed   By: Donavan Foil M.D.   On: 04/14/2020 01:27   US RENAL  Result Date: 04/16/2020 CLINICAL DATA:  Acute renal failure. EXAM: RENAL / URINARY TRACT ULTRASOUND COMPLETE COMPARISON:  CT abdomen and pelvis 04/14/2020 FINDINGS: Right Kidney: Renal measurements: 11.5 x 5.3 x 4.9 cm = volume:  158 mL. Echogenicity within normal limits. No mass or hydronephrosis visualized. Left Kidney: Renal  measurements: 11.3 x 5.5 x 6.1 cm = volume: 197 mL. Echogenicity within normal limits. No mass or hydronephrosis visualized. Bladder: Decompressed by Foley catheter. Other: None. IMPRESSION: Negative renal ultrasound. Electronically Signed   By: Logan Bores M.D.   On: 04/16/2020 13:01   MR SHOULDER RIGHT WO CONTRAST  Result Date: 04/01/2020 CLINICAL DATA:  Right shoulder pain and limited range of motion. No specific injury. EXAM: MRI OF THE RIGHT SHOULDER WITHOUT CONTRAST TECHNIQUE: Multiplanar, multisequence MR imaging of the shoulder was performed. No intravenous contrast was administered. COMPARISON:  None. FINDINGS: Examination limited by patient motion. Rotator cuff: Full-thickness retracted tear of the supraspinatus tendon is noted. This involves the mid and posterior fibers. The tear is in the critical zone region. Maximum retraction is estimated at 22 mm and the tear is 15.5 mm wide. The anterior fibers are still intact and the subscapularis tendon is intact. The infraspinatus tendon is intact. Tendinopathy noted with interstitial tears. Muscles:  No significant findings. Biceps long head:  Intact. Acromioclavicular Joint: Moderate degenerative changes. Type 2 acromion. No significant lateral downsloping or undersurface spurring. Glenohumeral Joint: Moderate to advanced degenerative changes with areas of full or near full-thickness cartilage loss, joint space narrowing, spurring and subchondral cystic change. Labrum:  Labral degenerative changes without obvious tear. Bones: No acute bony findings. There appears to be a small defect in the glenoid with fluid tracking back into the subcoracoid space. Possible old fracture. Other: Expected fluid in the subacromial/subdeltoid bursa. IMPRESSION: 1. Full-thickness retracted tear of the supraspinatus tendon as described above. 2. Intact long head biceps tendon  and grossly intact glenoid labrum. 3. Advanced glenohumeral joint degenerative changes. 4. Moderate AC joint degenerative changes but no other significant findings for bony impingement. Electronically Signed   By: Marijo Sanes M.D.   On: 04/01/2020 20:48   DG Abd Portable 2V  Result Date: 04/15/2020 CLINICAL DATA:  Diverticulitis.  Abdominal pain and distention. EXAM: PORTABLE ABDOMEN - 2 VIEW COMPARISON:  CT 04/14/2020. FINDINGS: Pelvic drainage catheter again noted. No bowel distention. Stool noted throughout the colon. No free air. Bibasilar atelectasis. Degenerative change lumbar spine and both hips. Contrast in the bladder from prior CT. IMPRESSION: 1.  Pelvic drainage catheter in stable position. 2. No acute abdominal abnormality identified. No bowel distention or free air. 3.  Bibasilar atelectasis. Electronically Signed   By: Marcello Moores  Register   On: 04/15/2020 08:07   CT IMAGE GUIDED DRAINAGE PERCUT CATH  PERITONEAL RETROPERIT  Result Date: 04/14/2020 INDICATION: 59 year old with air-fluid collection in the lower abdomen / upper pelvic region. Findings are suggestive for a colonic diverticular abscess. Patient needs CT-guided drainage. EXAM: CT-GUIDED DRAINAGE OF PELVIC ABSCESS MEDICATIONS: Fentanyl 25 mcg ANESTHESIA/SEDATION: The patient was continuously monitored during the procedure by the interventional radiology nurse under my direct supervision. COMPLICATIONS: None immediate. PROCEDURE: Informed written consent was obtained from the patient after a thorough discussion of the procedural risks, benefits and alternatives. All questions were addressed. A timeout was performed prior to the initiation of the procedure. Patient was placed supine on the CT scanner. Images through the lower abdomen and pelvis were performed. Air-fluid collection in the lower abdomen/pelvic region was identified and targeted for drainage. The anterior abdomen was prepped with chlorhexidine and sterile field was created.  Skin and soft tissues were anesthetized using 1% lidocaine. A small incision was made. Using CT guidance, a 18 gauge trocar needle was directed into the air-fluid collection. Gray purulent fluid was coming out of the needle. Super stiff  Amplatz wire was advanced into the collection and follow up CT images were obtained. The drain was dilated to accommodate a 12 [place] multipurpose drain. 12 French drain was placed. 40 mL gray colored purulent fluid and gas was removed from the collection. Follow up CT images were obtained. Catheter was sutured to skin and attached to a suction bulb. FINDINGS: Air-fluid collection just cephalad to the urinary bladder. Drain was successfully placed within the collection and 40 mL of purulent fluid was removed. The abscess was decompressed at the end of the procedure. IMPRESSION: CT-guided placement of a drainage catheter within the lower abdomen/pelvic abscess. Electronically Signed   By: Markus Daft M.D.   On: 04/14/2020 11:48    Assessment & Plan    59 yo female with history of copd, hypertension HIV , admitted with sepsis and has developed elevated liver transaminases,acute renal failure requiring hd and afib with rvr.  Afib-remains predominantly in sinus rhythm on amiodarone orally.  Had an episode of A. fib with RVR yesterday converted back to sinus rhythm with a bolus of IV amiodarone. IN nsr this am on po amiodarone Will continue with amiodarone at 400 mg bid for now. Decrease to 200 bid at discharge.   Renal failure-on HD. Renal insufficiency likely secondary to contrast. Creatinine was increased at 5 today. K 5.2  Sepsis-. Continues on broad-spectrum antibiotics.  Clinically is improved.  Liver failure-elevated transaminases have improved.  Respiratory failure-clinically improved.  Doing well.  Pelvic abscess-being followed by general surgery.   Signed, Javier Docker Maxum Cassarino MD 04/21/2020, 7:11 AM  Pager: [phone]

## 2020-04-21 NOTE — Progress Notes (Signed)
Melanie Bowen went into A-Fib with a rate in the 140s-150s when dialysis started and it lasted about 9 minutes before transitioning back into ST.  Melanie Bowen then about 10 minutes later complained of chest pain after eating and laying down.  Dr. Patsey Berthold on site and EKG done and was NSR. GI cocktail was ordered as it was suspected gastric reflux.  It was effective for relief.  Dr. Roger Shelter notified and I was instructed to notify cardiology.  I called and spoke with Dr. Ubaldo Glassing and he was updated as well.  Melanie Bowen's dialysis cath clotted off so cath-flo was used by dialysis nurse and dialysis was later restarted.  Melanie Bowen went into A-Fib again for a few minutes before returning to [place]. Melanie Bowen complained of just generally not feeling well after dialysis.

## 2020-04-21 NOTE — Progress Notes (Addendum)
Patient ID: Melanie Bowen, female   DOB: Mar 28, 1961, 59 y.o.   MRN: [id number]  This NP reviewed medical records, received report from team and then visited patient at the bedside as a follow up for palliative medicine needs and emotional support.  Patient remains critically ill, status post percutaneous drain on 8/16 2/2 through sigmoid diverticulitis with abscess, currently in the ICU,  CRRT in progress, patient lethargic, little conversation/engagement  with this provider.  Patient's sister is at bedside  Continued education with patient and her family regarding the importance of conversation and documentation of H POA and advanced directives.  PMT will continue to support holistically     Patient is high risk for decompensation.  Total time spent on the unit was 15 minutes   Discussed with treatment team and Dr. Roger Shelter  Greater than 50% of the time was spent in counseling and coordination of care  Wadie Lessen NP  Palliative Medicine Team Team Phone # [phone] Pager [phone]

## 2020-04-21 NOTE — Progress Notes (Signed)
Patient heart rate was too high. The RN did not want to do it at that time. Patient was on dialysis later that day.

## 2020-04-21 NOTE — Progress Notes (Signed)
ID Pt doing better Awake and more  alert Says she does not have any breathing difficulty No pain abdomen Sister at bed side  O/e awake and more alert BP 110/85   Pulse (!) 108   Temp (P) 97.7 F (36.5 C)   Resp 20   Ht 5\' 6"  (1.676 m)   Wt 121.6 kg   SpO2 95%   BMI 43.27 kg/m    Getting dialysis Chest b/l air entry Hs- irrgeular abd soft CNS non focal  Labs  CBC Latest Ref Rng & Units 04/19/2020 04/18/2020 04/17/2020  WBC 4.0 - 10.5 K/uL 24.7(H) 24.8(H) 22.6(H)  Hemoglobin 12.0 - 15.0 g/dL 11.5(L) 12.0 11.5(L)  Hematocrit 36 - 46 % 31.3(L) 32.4(L) 31.5(L)  Platelets 150 - 400 K/uL 36(L) 31(L) 49(L)    CMP Latest Ref Rng & Units 04/19/2020 04/18/2020 04/17/2020  Glucose 70 - 99 mg/dL 143(H) 126(H) 110(H)  BUN 6 - 20 mg/dL 61(H) 63(H) 63(H)  Creatinine 0.44 - 1.00 mg/dL 3.89(H) 4.21(H) 4.51(H)  Sodium 135 - 145 mmol/L 136 136 136  Potassium 3.5 - 5.1 mmol/L 5.5(H) 4.8 4.9  Chloride 98 - 111 mmol/L 100 101 102  CO2 22 - 32 mmol/L 18(L) 20(L) 17(L)  Calcium 8.9 - 10.3 mg/dL 8.2(L) 7.3(L) 6.9(L)  Total Protein 6.5 - 8.1 g/dL 5.2(L) 5.5(L) 5.5(L)  Total Bilirubin 0.3 - 1.2 mg/dL 1.2 0.7 0.9  Alkaline Phos 38 - 126 U/L 113 120 127(H)  AST 15 - 41 U/L 94(H) 247(H) 791(H)  ALT 0 - 44 U/L 419(H) 746(H) 1,153(H)    Micro Blood culture- 8/16 NG Abscess culture- - pseudomonas, ESBL E.coli  klebsiella and multiple anerobes   UC neg   Impression/recommendation  Septic shock resolved-secondary to perforated diverticulitisand peridiverticular abscess.has JP drain inserted on 04/14/20.  Onmeropenem and anidulafungin since  04/15/20 . Because of pseudomonas, kleb and ESBL e.coli in the culture continue meropenem. We can stop anidulafungin today.  Repeat CT abdomen- abscess has resolved  Transaminitis due to shock liver much improved  AKI due to septic shock improving getting dialysis  Thrombocytopenia worsening   HIV- well controlled in jan 2021 her Vl  <20 and cd4 >1000. She was getting care at [facility] and saw her provider last in June 2021  Alphonzo Cruise on hold. This has 4 different meds and we need to adjust the dose because of AKI  Last CD4 count is 265 with 33% on 04/14/2020.  COPD- on tapering steroids Discussed the management with patient and her sister

## 2020-04-21 NOTE — Progress Notes (Signed)
[facility]  ROUNDING NOTE   Subjective:  Patient seen and evaluated at bedside. Still has significant renal dysfunction though creatinine is down to 3.89. Recorded urine output yesterday was only 190 cc. Also has mild hyperkalemia with serum potassium of 5.5. Serum bicarbonate also low at 18. Patient inquiring as to when she can go home.   Objective:  Vital signs in last 24 hours:  Temp:  [98.1 F (36.7 C)-99.3 F (37.4 C)] 98.5 F (36.9 C) (08/23 0200) Pulse Rate:  [86-101] 94 (08/23 0600) Resp:  [12-25] 22 (08/23 0600) BP: (96-127)/(49-81) 112/67 (08/23 0600) SpO2:  [92 %-100 %] 93 % (08/23 0752)  Weight change:  Filed Weights   04/13/20 2127 04/14/20 1822  Weight: 117.9 kg 121.6 kg    Intake/Output: I/O last 3 completed shifts: In: 1948.4 [I.V.:1388.4; IV Piggyback:560] Out: 274 [Urine:270; Drains:4]   Intake/Output this shift:  No intake/output data recorded.  Physical Exam: General:  No acute distress  Head:  Normocephalic, atraumatic. Moist oral mucosal membranes  Eyes:  Anicteric  Neck:  Supple  Lungs:   Scattered rhonchi, normal effort  Heart:  S1S2 no rubs  Abdomen:   Mild distention  Extremities:  No peripheral edema.  Neurologic:  Awake, alert, following commands  Skin:  No lesions  Access:  Left femoral dialysis catheter    Basic Metabolic Panel: Recent Labs  Lab 04/16/20 0348 04/16/20 0348 04/16/20 0900 04/16/20 0900 04/17/20 0603 04/18/20 0442 04/19/20 0848  NA 135  --  135  --  136 136 136  K 5.5*  --  4.9  --  4.9 4.8 5.5*  CL 102  --  101  --  102 101 100  CO2 16*  --  14*  --  17* 20* 18*  GLUCOSE 145*  --  172*  --  110* 126* 143*  BUN 64*  --  66*  --  63* 63* 61*  CREATININE 4.48*  --  4.65*  --  4.51* 4.21* 3.89*  CALCIUM 6.6*   < > 6.7*   < > 6.9* 7.3* 8.2*  MG 1.9  --   --   --  1.7 1.9 1.9  PHOS 8.4*  --   --   --  5.3* 5.9* 6.2*   < > = values in this interval not displayed.    Liver Function  Tests: Recent Labs  Lab 04/15/20 0609 04/16/20 0348 04/17/20 0603 04/18/20 0442 04/19/20 0848  AST 1,634* 2,020* 791* 247* 94*  ALT 853* 1,556* 1,153* 746* 419*  ALKPHOS 60 84 127* 120 113  BILITOT 0.6 0.8 0.9 0.7 1.2  PROT 4.4* 5.3* 5.5* 5.5* 5.2*  ALBUMIN 1.7* 2.2* 2.1* 2.0* 1.9*   No results for input(s): LIPASE, AMYLASE in the last 168 hours. No results for input(s): AMMONIA in the last 168 hours.  CBC: Recent Labs  Lab 04/14/20 1245 04/14/20 1245 04/15/20 0609 04/16/20 0348 04/17/20 0603 04/18/20 0442 04/19/20 0458  WBC 7.4   < > 17.2* 15.2* 22.6* 24.8* 24.7*  NEUTROABS 6.1  --   --   --   --   --   --   HGB 13.7   < > 11.9* 11.5* 11.5* 12.0 11.5*  HCT 41.7   < > 36.8 35.2* 31.5* 32.4* 31.3*  MCV 92.3   < > 95.1 92.9 84.7 83.5 82.2  PLT 294   < > 149* 94* 49* 31* 36*   < > = values in this interval not displayed.  Cardiac Enzymes: No results for input(s): CKTOTAL, CKMB, CKMBINDEX, TROPONINI in the last 168 hours.  BNP: Invalid input(s): POCBNP  CBG: Recent Labs  Lab 04/14/20 1828  GLUCAP 59    Microbiology: Results for orders placed or performed during the hospital encounter of 04/14/20  SARS Coronavirus 2 by RT PCR (hospital order, performed in [facility] hospital lab) Nasopharyngeal Nasopharyngeal Swab     Status: None   Collection Time: 04/14/20  2:17 AM   Specimen: Nasopharyngeal Swab  Result Value Ref Range Status   SARS Coronavirus 2 NEGATIVE NEGATIVE Final    Comment: (NOTE) SARS-CoV-2 target nucleic acids are NOT DETECTED.  The SARS-CoV-2 RNA is generally detectable in upper and lower respiratory specimens during the acute phase of infection. The lowest concentration of SARS-CoV-2 viral copies this assay can detect is 250 copies / mL. A negative result does not preclude SARS-CoV-2 infection and should not be used as the sole basis for treatment or other patient management decisions.  A negative result may occur with improper specimen  collection / handling, submission of specimen other than nasopharyngeal swab, presence of viral mutation(s) within the areas targeted by this assay, and inadequate number of viral copies (<250 copies / mL). A negative result must be combined with clinical observations, patient history, and epidemiological information.  Fact Sheet for Patients:   StrictlyIdeas.no  Fact Sheet for Healthcare Providers: BankingDealers.co.za  This test is not yet approved or  cleared by the [place] FDA and has been authorized for detection and/or diagnosis of SARS-CoV-2 by FDA under an Emergency Use Authorization (EUA).  This EUA will remain in effect (meaning this test can be used) for the duration of the COVID-19 declaration under Section 564(b)(1) of the Act, 21 U.S.C. section 360bbb-3(b)(1), unless the authorization is terminated or revoked sooner.  Performed at [facility], [address] [place], [place] [address]   CULTURE, BLOOD (ROUTINE X 2) w Reflex to ID Panel     Status: None   Collection Time: 04/14/20  8:14 AM   Specimen: BLOOD  Result Value Ref Range Status   Specimen Description BLOOD LEFT [facility]  Final   Special Requests   Final    BOTTLES DRAWN AEROBIC AND ANAEROBIC Blood Culture adequate volume   Culture   Final    NO GROWTH 5 DAYS Performed at [facility], [address] [place], [place] [address]    Report Status 04/19/2020 FINAL  Final  CULTURE, BLOOD (ROUTINE X 2) w Reflex to ID Panel     Status: None   Collection Time: 04/14/20  8:14 AM   Specimen: BLOOD  Result Value Ref Range Status   Specimen Description BLOOD LEFT WRIST  Final   Special Requests   Final    BOTTLES DRAWN AEROBIC AND ANAEROBIC Blood Culture adequate volume   Culture   Final    NO GROWTH 5 DAYS Performed at [facility], [address] [place], [place] [address]    Report Status 04/19/2020 FINAL  Final   Aerobic/Anaerobic Culture (surgical/deep wound)     Status: None   Collection Time: 04/14/20 11:14 AM   Specimen: Abscess  Result Value Ref Range Status   Specimen Description   Final    ABSCESS Performed at [facility], [address], [place], [place] [address]    Special Requests   Final    NONE Performed at [facility], [address] [place], [place] [address]    Gram Stain  Final    NO WBC SEEN ABUNDANT GRAM NEGATIVE RODS FEW GRAM POSITIVE COCCI FEW GRAM POSITIVE RODS Performed at [facility], [place] [address], [place], [place] [address]    Culture   Final    ABUNDANT KLEBSIELLA PNEUMONIAE ABUNDANT PSEUDOMONAS AERUGINOSA MODERATE ESCHERICHIA COLI Confirmed Extended Spectrum Beta-Lactamase Producer (ESBL).  In bloodstream infections from ESBL organisms, carbapenems are preferred over piperacillin/tazobactam. They are shown to have a lower risk of mortality. MIXED ANAEROBIC FLORA PRESENT.  CALL LAB IF FURTHER IID REQUIRED.    Report Status 04/20/2020 FINAL  Final   Organism ID, Bacteria KLEBSIELLA PNEUMONIAE  Final   Organism ID, Bacteria PSEUDOMONAS AERUGINOSA  Final   Organism ID, Bacteria ESCHERICHIA COLI  Final      Susceptibility   Escherichia coli - MIC*    AMPICILLIN >=32 RESISTANT Resistant     CEFAZOLIN >=64 RESISTANT Resistant     CEFEPIME 16 RESISTANT Resistant     CEFTAZIDIME RESISTANT Resistant     CEFTRIAXONE >=64 RESISTANT Resistant     CIPROFLOXACIN >=4 RESISTANT Resistant     GENTAMICIN <=1 SENSITIVE Sensitive     IMIPENEM <=0.25 SENSITIVE Sensitive     TRIMETH/SULFA >=320 RESISTANT Resistant     AMPICILLIN/SULBACTAM >=32 RESISTANT Resistant     PIP/TAZO 8 SENSITIVE Sensitive     * MODERATE ESCHERICHIA COLI   Klebsiella pneumoniae - MIC*    AMPICILLIN >=32 RESISTANT Resistant     CEFAZOLIN <=4 SENSITIVE Sensitive     CEFEPIME <=0.12 SENSITIVE Sensitive     CEFTAZIDIME <=1 SENSITIVE Sensitive     CEFTRIAXONE <=0.25  SENSITIVE Sensitive     CIPROFLOXACIN <=0.25 SENSITIVE Sensitive     GENTAMICIN <=1 SENSITIVE Sensitive     IMIPENEM <=0.25 SENSITIVE Sensitive     TRIMETH/SULFA <=20 SENSITIVE Sensitive     AMPICILLIN/SULBACTAM 16 INTERMEDIATE Intermediate     PIP/TAZO 8 SENSITIVE Sensitive     * ABUNDANT KLEBSIELLA PNEUMONIAE   Pseudomonas aeruginosa - MIC*    CEFTAZIDIME 4 SENSITIVE Sensitive     CIPROFLOXACIN <=0.25 SENSITIVE Sensitive     GENTAMICIN 2 SENSITIVE Sensitive     IMIPENEM 2 SENSITIVE Sensitive     PIP/TAZO 8 SENSITIVE Sensitive     CEFEPIME 4 SENSITIVE Sensitive     * ABUNDANT PSEUDOMONAS AERUGINOSA  MRSA PCR Screening     Status: None   Collection Time: 04/14/20  6:19 PM   Specimen: Nasopharyngeal  Result Value Ref Range Status   MRSA by PCR NEGATIVE NEGATIVE Final    Comment:        The GeneXpert MRSA Assay (FDA approved for NASAL specimens only), is one component of a comprehensive MRSA colonization surveillance program. It is not intended to diagnose MRSA infection nor to guide or monitor treatment for MRSA infections. Performed at [facility], [address], [place], [place] [address]   Urine Culture     Status: None   Collection Time: 04/15/20  4:28 AM   Specimen: Urine, Random  Result Value Ref Range Status   Specimen Description   Final    URINE, RANDOM Performed at [facility], [address], [place], [place] [address]    Special Requests   Final    NONE Performed at [facility], [address], [place], [place] [address]    Culture   Final    NO GROWTH Performed at [facility], [place] [address] [place], [place] [address]    Report Status 04/16/2020 FINAL  Final    Coagulation Studies: No results for input(s): LABPROT, INR in the last 72 hours.  Urinalysis: No results for input(s): COLORURINE, LABSPEC, PHURINE, GLUCOSEU, HGBUR, BILIRUBINUR, KETONESUR, PROTEINUR, UROBILINOGEN, NITRITE, LEUKOCYTESUR in the last 72  hours.  Invalid input(s): APPERANCEUR    Imaging: No results found.   Medications:   . sodium chloride 10 mL/hr at 04/16/20 1200  . amiodarone 60 mg/hr (04/21/20 0619)  . anidulafungin Stopped (04/21/20 0009)  . meropenem (MERREM) IV Stopped (04/20/20 2307)  . norepinephrine (LEVOPHED) Adult infusion     . budesonide (PULMICORT) nebulizer solution  0.5 mg Nebulization BID  . Chlorhexidine Gluconate Cloth  6 each Topical Daily  . guaiFENesin-dextromethorphan  10 mL Oral Q8H  . ipratropium-albuterol  3 mL Nebulization Q4H  . methylPREDNISolone (SOLU-MEDROL) injection  40 mg Intravenous Q6H  . mometasone-formoterol  2 puff Inhalation BID  . pantoprazole (PROTONIX) IV  40 mg Intravenous Daily  . sodium chloride flush  5 mL Intracatheter Q8H  . sodium zirconium cyclosilicate  10 g Oral Daily   acetaminophen **OR** acetaminophen, ALPRAZolam, heparin NICU/SCN flush, HYDROcodone-acetaminophen, HYDROmorphone (DILAUDID) injection, ipratropium-albuterol, labetalol, magnesium hydroxide  Assessment/ Plan:  59 y.o. female with past medical history of HIV, COPD, hypertension who was admitted with shortness of breath, colonic diverticular abscess, diverticulitis and subsequently found to have acute kidney injury.  1.  Acute kidney injury secondary to ATN with multiple factors from severe sepsis and multiple IV contrast exposures.  Patient remains oliguric at this time with urine output of only 190 cc over the preceding 24 hours.  Creatinine currently 3.89.  Given oliguria however we will plan for dialysis session today.  2.  Hyperkalemia.  Serum potassium 5.5.  Maintain the patient on [place] but dialyze the patient against a 2K bath today.  3.  Metabolic acidosis.  Serum bicarbonate still low at 18.  This will be corrected with dialysis treatment.  4.  Disposition as per hospitalist.   LOS: 7 Truth Barot 8/23/20218:50 AM

## 2020-04-21 NOTE — Progress Notes (Signed)
   04/21/20 0815  Clinical Encounter Type  Visited With Patient  Visit Type Initial  Referral From Chaplain  Consult/Referral To Chaplain  While rounding ICU, chaplain stopped in and checked on patient when she saw her sitting up. There was a spill on patient table and patient asked chaplain to wipe table off. After drying the table, chaplain assisted patient in making a telephone call. Patient told chaplain she needs to "pee" but is having a hard time. Chaplain told patient she will be praying and left because patient was on the telephone.

## 2020-04-21 NOTE — Progress Notes (Signed)
Assisted tele visit to patient with daughter.  Thomas, Netanel Yannuzzi Renee, RN   

## 2020-04-21 NOTE — Progress Notes (Signed)
Assisted daughter with tele-visit via elink

## 2020-04-21 NOTE — Progress Notes (Signed)
Patient Name: Melanie Bowen Date of Encounter: 04/21/2020  Hospital Problem List     Principal Problem:   Severe sepsis with septic shock ([place]) Active Problems:   HTN (hypertension)   HIV (human immunodeficiency virus infection) ([place])   Diverticulitis of colon   Abscess of sigmoid colon due to diverticulitis   Acute renal failure (ARF) ([place])   Hypotension   Diverticulosis   Acute respiratory distress   DNR (do not resuscitate) discussion   Palliative care by specialist    Patient Profile     59 year old female with COPD, hypertension, diverticulosis, HIV, gastroesophageal reflux disease who presented with abdominal pain nausea and was noted to have a pelvic abscess. She had a CT-guided drainage and has a drain in place. She had atrial fibrillation rapid ventricular response which is a difficult control. Had acute renal insufficiency as well as elevated transaminases.  Subjective   More alert. Heart rate currently in nsr. States she wants to transfer to [facility].   Inpatient Medications    . acidophilus  1 capsule Oral Daily  . budesonide (PULMICORT) nebulizer solution  0.5 mg Nebulization BID  . Chlorhexidine Gluconate Cloth  6 each Topical Daily  . guaiFENesin-dextromethorphan  10 mL Oral Q8H  . ipratropium-albuterol  3 mL Nebulization Q4H  . methylPREDNISolone (SOLU-MEDROL) injection  40 mg Intravenous Q6H  . mometasone-formoterol  2 puff Inhalation BID  . pantoprazole (PROTONIX) IV  40 mg Intravenous Daily  . sodium chloride flush  5 mL Intracatheter Q8H  . sodium zirconium cyclosilicate  10 g Oral Daily    Vital Signs    Vitals:   04/21/20 1416 04/21/20 1500 04/21/20 1550 04/21/20 1600  BP: 108/81 121/87  109/60  Pulse: 93 (!) 106    Resp: 13 (!) 21    Temp:      TempSrc:      SpO2: 92% 94% 94%   Weight:      Height:        Intake/Output Summary (Last 24 hours) at 04/21/2020 1629 Last data filed at 04/21/2020 1423 Gross per 24 hour  Intake 1575.41 ml   Output 160 ml  Net 1415.41 ml   Filed Weights   04/13/20 2127 04/14/20 1822  Weight: 117.9 kg 121.6 kg    Physical Exam    GEN: Well nourished, well developed, in no acute distress.  HEENT: normal.  Neck: Supple, no JVD, carotid bruits, or masses. Cardiac: RRR, no murmurs, rubs, or gallops. No clubbing, cyanosis, edema.  Radials/DP/PT 2+ and equal bilaterally.  Respiratory:  Respirations regular and unlabored, clear to auscultation bilaterally. GI: Soft, nontender, nondistended, BS + x 4. MS: no deformity or atrophy. Skin: warm and dry, no rash. Neuro:  Strength and sensation are intact. Psych: Normal affect.  Labs    CBC Recent Labs    04/19/20 0458  WBC 24.7*  HGB 11.5*  HCT 31.3*  MCV 82.2  PLT 36*   Basic Metabolic Panel Recent Labs    04/19/20 0848  NA 136  K 5.5*  CL 100  CO2 18*  GLUCOSE 143*  BUN 61*  CREATININE 3.89*  CALCIUM 8.2*  MG 1.9  PHOS 6.2*   Liver Function Tests Recent Labs    04/19/20 0848  AST 94*  ALT 419*  ALKPHOS 113  BILITOT 1.2  PROT 5.2*  ALBUMIN 1.9*   No results for input(s): LIPASE, AMYLASE in the last 72 hours. Cardiac Enzymes No results for input(s): CKTOTAL, CKMB, CKMBINDEX, TROPONINI in the last  72 hours. BNP No results for input(s): BNP in the last 72 hours. D-Dimer No results for input(s): DDIMER in the last 72 hours. Hemoglobin A1C No results for input(s): HGBA1C in the last 72 hours. Fasting Lipid Panel No results for input(s): CHOL, HDL, LDLCALC, TRIG, CHOLHDL, LDLDIRECT in the last 72 hours. Thyroid Function Tests No results for input(s): TSH, T4TOTAL, T3FREE, THYROIDAB in the last 72 hours.  Invalid input(s): FREET3  Telemetry    Currently nsr with intermitent afib with rvr    ECG    afib with rvr.   Radiology    CT ABDOMEN PELVIS WO CONTRAST  Result Date: 04/18/2020 CLINICAL DATA:  59 year old female with concern for abdominal abscess or infection. EXAM: CT ABDOMEN AND PELVIS WITHOUT  CONTRAST TECHNIQUE: Multidetector CT imaging of the abdomen and pelvis was performed following the standard protocol without IV contrast. COMPARISON:  CT abdomen pelvis dated 04/14/2020. FINDINGS: Evaluation of this exam is limited due to respiratory motion artifact. Lower chest: Partially visualized clusters of ground-glass opacity primarily involving the right lung base may represent areas of atelectasis but concerning for pneumonia. Clinical correlation is recommended. No intra-abdominal free air. There is a small free fluid within the pelvis. Hepatobiliary: The liver is unremarkable. No intrahepatic biliary ductal dilatation. High attenuating content within the gallbladder, likely vicarious excretion of contrast. Pancreas: Unremarkable. No pancreatic ductal dilatation or surrounding inflammatory changes. Spleen: Normal in size without focal abnormality. Adrenals/Urinary Tract: The adrenal glands unremarkable. Retained contrast from prior CT within the renal parenchyma consistent with delayed clearance and acute renal insufficiency. There is striated renal parenchyma concerning for pyelonephritis. Correlation with urinalysis recommended. There is no hydronephrosis. The visualized ureters appear unremarkable. The urinary bladder is decompressed around a Foley catheter. Stomach/Bowel: There is distal colonic and sigmoid diverticulosis without active inflammatory changes. Several mildly dilated small bowel loops measuring up to 3.4 cm in caliber without a transition most likely an ileus. Oral contrast traverses into the cecum. The appendix is normal. Vascular/Lymphatic: Advanced aortoiliac atherosclerotic disease. The IVC is unremarkable. No portal venous gas. There is no adenopathy. Reproductive: The uterus is grossly unremarkable. Other: Diffuse edema of the pelvic floor with small free fluid. Overall decrease in the inflammatory changes and stranding compared to the prior CT. A pigtail drainage catheter is noted  within the pelvis in similar position. No drainable fluid collection identified. Musculoskeletal: There is osteopenia with degenerative changes of the spine. L3-L5 disc spacer and posterior fusion. There is disc desiccation and vacuum phenomena at L5-S1. No acute osseous pathology. IMPRESSION: 1. Partially visualized right lung base atelectasis or pneumonia. Clinical correlation is recommended. 2. Retained contrast within the renal parenchyma concerning for acute renal insufficiency. There is heterogeneous and striated nephrogram which may represent pyelonephritis. Correlation with urinalysis recommended. 3. Mild ileus.  No definite obstruction. 4. Colonic diverticulosis. 5. Overall decrease in the inflammatory changes and stranding compared to the prior CT. A pigtail drainage catheter within the pelvis is similar position. No drainable fluid collection identified. 6. Aortic Atherosclerosis (ICD10-I70.0). Electronically Signed   By: Anner Crete M.D.   On: 04/18/2020 17:59   CT ABDOMEN PELVIS WO CONTRAST  Result Date: 04/14/2020 CLINICAL DATA:  Abdominal pain. Contained perforated acute diverticulitis. EXAM: CT ABDOMEN AND PELVIS WITHOUT CONTRAST TECHNIQUE: Multidetector CT imaging of the abdomen and pelvis was performed following the standard protocol without IV contrast. COMPARISON:  CT 04/14/2020, 01/31/2019 FINDINGS: Lower chest: Lung bases are clear. Hepatobiliary: No focal hepatic lesion. No biliary duct dilatation. Common bile  duct is normal. Pancreas: Pancreas is normal. No ductal dilatation. No pancreatic inflammation. Spleen: Normal spleen Adrenals/urinary tract: Adrenal glands and kidneys are normal. The ureters and bladder normal. Stomach/Bowel: Stomach duodenum normal. Mildly thickened loops small bowel in the pelvis is favored secondary inflammation related to pelvic abscess. Appendix is partially imaged (image 61/2) appears normal. The ascending and transverse colon normal. There are  diverticula descending colon and sigmoid colon. Diverticular disease is heavy in the proximal sigmoid colon. There is a thick-walled fluid collection with the an air-fluid level measuring 8.1 x 4.7 cm in the central anterior upper pelvis. This is adjacent to the sigmoid colon. There is a thin tract between the colon in the fluid collection on image 71/2. Similar pericolonic abscess seen on CT 01/17/2019. Findings most consistent with contained sigmoid perforation with abscess formation. No intraperitoneal free.  Minimal fluid in the pelvis. Vascular/Lymphatic: Abdominal aorta is normal caliber with atherosclerotic calcification. There is no retroperitoneal or periportal lymphadenopathy. No pelvic lymphadenopathy. Reproductive: Post hysterectomy.  Adnexa unremarkable Other: Small volume free fluid the pelvis Musculoskeletal: Posterior lumbar fusion IMPRESSION: 1. No significant interval change in short interval follow-up. 2. Contained sigmoid colon perforation with abscess formation. Perforation presumed related to diverticulitis. Findings similar to May 2020 with increased volume of contained perforation. 3. No intraperitoneal free air. 4. Secondary inflammation of the small bowel. Electronically Signed   By: Suzy Bouchard M.D.   On: 04/14/2020 08:08   DG Chest 1 View  Result Date: 04/14/2020 CLINICAL DATA:  Dyspnea EXAM: CHEST  1 VIEW COMPARISON:  11/06/2019 FINDINGS: Mildly low lung volumes. There is no edema, consolidation, effusion, or pneumothorax. Normal heart size and mediastinal contours. Artifact from EKG leads IMPRESSION: No evidence of acute disease. Electronically Signed   By: Monte Fantasia M.D.   On: 04/14/2020 07:17   DG Abd 1 View  Result Date: 04/14/2020 CLINICAL DATA:  Abdominal pain.  Pelvic abscess drainage today. EXAM: ABDOMEN - 1 VIEW COMPARISON:  CT abdomen pelvis from same day. FINDINGS: The bowel gas pattern is normal. No intraperitoneal free air peer no radio-opaque calculi or  other significant radiographic abnormality are seen. No acute osseous abnormality. Prior lumbar fusion. IMPRESSION: 1. Negative. Electronically Signed   By: Titus Dubin M.D.   On: 04/14/2020 14:37   CT ABDOMEN PELVIS W CONTRAST  Result Date: 04/14/2020 CLINICAL DATA:  Peritonitis or perforation suspected Patient with history of perforated diverticulitis post percutaneous drain placement today. EXAM: CT ABDOMEN AND PELVIS WITH CONTRAST TECHNIQUE: Multidetector CT imaging of the abdomen and pelvis was performed using the standard protocol following bolus administration of intravenous contrast. CONTRAST:  32mL OMNIPAQUE IOHEXOL 300 MG/ML  SOLN COMPARISON:  Two prior abdominopelvic CT earlier this day, prior to drain placement. FINDINGS: Lower chest: Small pleural effusions are new from prior exam. Adjacent compressive atelectasis. Breathing motion artifact in the lung bases limits assessment. Hepatobiliary: Small amount of perihepatic fluid slightly increased from prior exam. This appears mildly complex. High-density contents in the urinary bladder increasing likely vicarious excretion of IV contrast. There is no discrete focal hepatic abnormality allowing for motion. Pancreas: Motion artifact through the pancreas. No obvious inflammation. Spleen: Normal allowing for motion. Adrenals/Urinary Tract: No adrenal nodule. There is heterogeneous enhancement of both kidneys. No significant perinephric edema. Minimal excretion from both kidneys on delayed phase imaging. There is excreted IV contrast within the urinary bladder. Stomach/Bowel: New surgical drain within the pelvic fluid collection. No definite residual fluid. A drain is closely approximated to fecalized  loops of small bowel in the pelvis. Motion artifact limits detailed bowel assessment. Similar appearance of scattered small bowel thickening from prior exam which is likely reactive. There is no evidence of obstruction. No bowel pneumatosis. Diverticulosis  involving the colon. No new colonic inflammation or diverticulitis. Air-fluid level in the stomach with small amount of fluid in the distal esophagus. Vascular/Lymphatic: Aortic atherosclerosis. No aortic aneurysm. No evidence of portal venous or mesenteric gas. No bulky adenopathy. Reproductive: Uterus and bilateral adnexa are unremarkable. Other: Surgical drain within the pelvic fluid collection which appears completely decompressed. No definite residual collection. Generalized fat stranding involving the lower small bowel mesentery. Small amount of non organized free fluid in the dependent pelvis, tracking into the pericolic gutters and right upper quadrant. There is no definite free air. Musculoskeletal: Stable. Postsurgical change in the lower lumbar spine. IMPRESSION: 1. Interval percutaneous drainage of pelvic abscess. No residual fluid collection. 2. Small amount of non organized free fluid in the pelvis, pericolic gutters, and perihepatic right upper quadrant, slight increase from pre drainage CT earlier today. No evidence of new abscess. No free air or interval perforation. 3. Heterogeneous enhancement of both kidneys, nonspecific but can be seen with pyelonephritis. 4. Small pleural effusions are new from prior exam. Adjacent compressive atelectasis. Electronically Signed   By: Keith Rake M.D.   On: 04/14/2020 22:22   CT Abdomen Pelvis W Contrast  Result Date: 04/14/2020 CLINICAL DATA:  Lower abdominal pain EXAM: CT ABDOMEN AND PELVIS WITH CONTRAST TECHNIQUE: Multidetector CT imaging of the abdomen and pelvis was performed using the standard protocol following bolus administration of intravenous contrast. CONTRAST:  15mL OMNIPAQUE IOHEXOL 300 MG/ML  SOLN COMPARISON:  01/31/2019 FINDINGS: Lower chest: Lung bases demonstrate no acute consolidation or pleural effusion. Normal cardiac size. Hepatobiliary: No focal liver abnormality is seen. No gallstones, gallbladder wall thickening, or biliary  dilatation. Pancreas: Unremarkable. No pancreatic ductal dilatation or surrounding inflammatory changes. Spleen: Normal in size without focal abnormality. Adrenals/Urinary Tract: Adrenal glands are unremarkable. Kidneys are normal, without renal calculi, focal lesion, or hydronephrosis. Bladder is unremarkable. Stomach/Bowel: The stomach is nonenlarged. No dilated small bowel. Mildly thickened loops of small bowel within the pelvis, likely reactive. Negative appendix. Sigmoid colon diverticular disease. Wall thickening and mild inflammatory change at the distal sigmoid colon consistent with diverticulitis. 7.5 x 4.4 cm gas and fluid collection within the anterior pelvis suspicious for contained perforation/abscess. Soft tissue inflammatory density extends from inflamed sigmoid colon to the gas and fluid collection, sagittal series 6, image number 96. Vascular/Lymphatic: Moderate aortic atherosclerosis. No aneurysm. No suspicious nodes. Reproductive: Uterus and bilateral adnexa are unremarkable. Other: No free air. Small free fluid in the pelvis. Small amount of upper abdominal ascites. Generalized soft tissue stranding within the pelvis consistent with inflammatory process. Musculoskeletal: Postsurgical changes of the lumbar spine with surgical rod and fixating screws L3 through L5. No acute osseous abnormality IMPRESSION: 1. Findings consistent with acute sigmoid colon diverticulitis. 7.5 x 4.4 cm gas and fluid collection within the anterior pelvis suspicious for contained perforation/abscess, this appears to communicate with the thickened inflamed sigmoid colon. 2. Small amount of upper abdominal and pelvic ascites. 3. Thickened appearance of pelvic small bowel loops, likely reactive Aortic Atherosclerosis (ICD10-I70.0). Electronically Signed   By: Donavan Foil M.D.   On: 04/14/2020 01:27   US RENAL  Result Date: 04/16/2020 CLINICAL DATA:  Acute renal failure. EXAM: RENAL / URINARY TRACT ULTRASOUND COMPLETE  COMPARISON:  CT abdomen and pelvis 04/14/2020 FINDINGS: Right Kidney:  Renal measurements: 11.5 x 5.3 x 4.9 cm = volume: 158 mL. Echogenicity within normal limits. No mass or hydronephrosis visualized. Left Kidney: Renal measurements: 11.3 x 5.5 x 6.1 cm = volume: 197 mL. Echogenicity within normal limits. No mass or hydronephrosis visualized. Bladder: Decompressed by Foley catheter. Other: None. IMPRESSION: Negative renal ultrasound. Electronically Signed   By: Logan Bores M.D.   On: 04/16/2020 13:01   MR SHOULDER RIGHT WO CONTRAST  Result Date: 04/01/2020 CLINICAL DATA:  Right shoulder pain and limited range of motion. No specific injury. EXAM: MRI OF THE RIGHT SHOULDER WITHOUT CONTRAST TECHNIQUE: Multiplanar, multisequence MR imaging of the shoulder was performed. No intravenous contrast was administered. COMPARISON:  None. FINDINGS: Examination limited by patient motion. Rotator cuff: Full-thickness retracted tear of the supraspinatus tendon is noted. This involves the mid and posterior fibers. The tear is in the critical zone region. Maximum retraction is estimated at 22 mm and the tear is 15.5 mm wide. The anterior fibers are still intact and the subscapularis tendon is intact. The infraspinatus tendon is intact. Tendinopathy noted with interstitial tears. Muscles:  No significant findings. Biceps long head:  Intact. Acromioclavicular Joint: Moderate degenerative changes. Type 2 acromion. No significant lateral downsloping or undersurface spurring. Glenohumeral Joint: Moderate to advanced degenerative changes with areas of full or near full-thickness cartilage loss, joint space narrowing, spurring and subchondral cystic change. Labrum:  Labral degenerative changes without obvious tear. Bones: No acute bony findings. There appears to be a small defect in the glenoid with fluid tracking back into the subcoracoid space. Possible old fracture. Other: Expected fluid in the subacromial/subdeltoid bursa.  IMPRESSION: 1. Full-thickness retracted tear of the supraspinatus tendon as described above. 2. Intact long head biceps tendon and grossly intact glenoid labrum. 3. Advanced glenohumeral joint degenerative changes. 4. Moderate AC joint degenerative changes but no other significant findings for bony impingement. Electronically Signed   By: Marijo Sanes M.D.   On: 04/01/2020 20:48   DG Abd Portable 2V  Result Date: 04/15/2020 CLINICAL DATA:  Diverticulitis.  Abdominal pain and distention. EXAM: PORTABLE ABDOMEN - 2 VIEW COMPARISON:  CT 04/14/2020. FINDINGS: Pelvic drainage catheter again noted. No bowel distention. Stool noted throughout the colon. No free air. Bibasilar atelectasis. Degenerative change lumbar spine and both hips. Contrast in the bladder from prior CT. IMPRESSION: 1.  Pelvic drainage catheter in stable position. 2. No acute abdominal abnormality identified. No bowel distention or free air. 3.  Bibasilar atelectasis. Electronically Signed   By: Marcello Moores  Register   On: 04/15/2020 08:07   CT IMAGE GUIDED DRAINAGE PERCUT CATH  PERITONEAL RETROPERIT  Result Date: 04/14/2020 INDICATION: 59 year old with air-fluid collection in the lower abdomen / upper pelvic region. Findings are suggestive for a colonic diverticular abscess. Patient needs CT-guided drainage. EXAM: CT-GUIDED DRAINAGE OF PELVIC ABSCESS MEDICATIONS: Fentanyl 25 mcg ANESTHESIA/SEDATION: The patient was continuously monitored during the procedure by the interventional radiology nurse under my direct supervision. COMPLICATIONS: None immediate. PROCEDURE: Informed written consent was obtained from the patient after a thorough discussion of the procedural risks, benefits and alternatives. All questions were addressed. A timeout was performed prior to the initiation of the procedure. Patient was placed supine on the CT scanner. Images through the lower abdomen and pelvis were performed. Air-fluid collection in the lower abdomen/pelvic  region was identified and targeted for drainage. The anterior abdomen was prepped with chlorhexidine and sterile field was created. Skin and soft tissues were anesthetized using 1% lidocaine. A small incision was made. Using  CT guidance, a 18 gauge trocar needle was directed into the air-fluid collection. Gray purulent fluid was coming out of the needle. Super stiff Amplatz wire was advanced into the collection and follow up CT images were obtained. The drain was dilated to accommodate a 12 [place] multipurpose drain. 12 French drain was placed. 40 mL gray colored purulent fluid and gas was removed from the collection. Follow up CT images were obtained. Catheter was sutured to skin and attached to a suction bulb. FINDINGS: Air-fluid collection just cephalad to the urinary bladder. Drain was successfully placed within the collection and 40 mL of purulent fluid was removed. The abscess was decompressed at the end of the procedure. IMPRESSION: CT-guided placement of a drainage catheter within the lower abdomen/pelvic abscess. Electronically Signed   By: Markus Daft M.D.   On: 04/14/2020 11:48    Assessment & Plan    59 yo female with history of copd, hypertension HIV , admitted with sepsis and has developed elevated liver transaminases,acute renal failure requiring hd and afib with rvr.  Afib-likely secondary to sepsis, renal failure etc. has had intermittent episodes of sinus rhythm.  At time of my exam, she is in sinus rhythm at a rate of 98.Has run of afib with rvr during dialysis.   The episodes of A. fib appear to be improving.  We will continue with IV amiodarone for now.  Not a candidate for anticoagulation.  Blood pressure soft fairly stable on IV amiodarone and has been in nsr most of the past 24 hours. I am converting her to po amiodarone 400 bid. Will attempt to convert to p.o. when sinus rhythm becomes more stable to help with her pressure.  Renal failure-on HD. Renal insufficiency likely  secondary to contrast.   Sepsis-. Continues on meropenem.  Slowly improving.    Liver failure-elevated transaminases  Respiratory failure on supplemental oxygen.  Signed, Javier Docker Cheyanne Lamison MD 04/21/2020, 4:29 PM  Pager: [phone]

## 2020-04-21 NOTE — Progress Notes (Signed)
Assisted tele visit to patient with family member.  Thomas, Hiawatha Merriott Renee, RN   

## 2020-04-21 NOTE — Progress Notes (Signed)
[facility] [place] NAME: Melanie Bowen    MR#:  [id number]  DATE OF BIRTH:  [birth year]  SUBJECTIVE:  CHIEF COMPLAINT:   Chief Complaint  Patient presents with  . Abdominal Pain  Patient sleeping, mouth breather.  No new issues. Daughter at [facility] course:   Per HPI:  hypertension, diverticulosis, HIV, GERD and depression, presented with acute onset of worsening left lower quadrant abdominal pain with N/V  And constipation.  The patient started having pain Friday 9 days ago when she was seen in the ER and given p.o. Augmentin .    Upon presentation BP: 151/98 with otherwise normal vital signs.  Labs revealed albumin of 2.8 with troponin of 6.4 and otherwise unremarkable CMP.  Urinalysis was unremarkable.  Abdominal and pelvic CT scan revealed the following: 1. Findings consistent with acute sigmoid colon diverticulitis. 7.5 x 4.4 cm gas and fluid collection within the anterior pelvis suspicious for contained perforation/abscess, this appears to communicate with the thickened inflamed sigmoid colon. 2. Small amount of upper abdominal and pelvic ascites. 3. Thickened appearance of pelvic small bowel loops, likely reactive  The patient was given 1 L bolus of IV lactated Ringer, 1 p.o. Norco, half milligram of IV Dilaudid, 4 mg of IV morphine sulfate and 4 mg of IV Zofran as well as 3.375 g of IV Zosyn.  Dr. Christian Mate was consulted about the patient and he will notify IR in a.m.     04/14/2020 patient hemodynamically decline was transferred to critical care team Surgery interventional radiology critical care team was consulted  04/15/2020 -patient remained under PCCM care lactic acid continues to worsen, now 7.2 (previously 6.1).  CT Abdomen/Pelvis obtained at 22:00 earlier in the shift, which was NEGATIVE   04/16/2020 -patient remains critically ill, improving hemodynamically, status post septic shock, multiorgan failure including  renal failure -nephrology was consulted patient will remain in ICU  04/17/2020-patient was seen, still critically ill, in respiratory distress audibly wheezing, with dialysis was initiated yesterday,-resuming again this morning ID following IV antibiotics surgery following still recommending no surgery -gallbladder drain in place  8/20 / 2021 -patient was seen, more awake, status post hemodialysis once again yesterday, still tachycardic, tachypneic, satting 97% on 3 L of oxygen   04/19/2020 -patient seems better she is responding to hemodialysis, still in A. fib with RVR cardiology attending to control rate with amiodarone, blood pressure remained soft -remains lethargic family present at bedside  04/21/2020 -patient was seen and examined, much more awake, cooperative, still on amiodarone drip, heart rate varies, urine culture positive for ESBL-continues on IV meropenem, on room air today  --------------------------------------------------------------------------------------------------------------------------------------------  Subjective: The patient was seen and examined this morning, awake alert, remains off oxygen satting 94% on room air  Still in A. fib with RVR, heart rate 86-1 01 has improved, Remains on amiodarone drip   Status post last hemodialysis 04/18/2020,   Patient has been weaned off 6 L of oxygen to 3 L, to 3 L now this morning on room air satting 96%      ASSESSMENT AND PLAN:    Septic shock  -source diverticulosis/abscess -Much improved, hemodynamically stable now with exception of tachypnea  -Remain off supplemental oxygen, satting 94% on room air  -A. fib with RVR, continue to be on amiodarone drip   -Status post IR placement of gallbladder drain -monitoring  -Much improved respiratory status, has been weaned off from BiPAP >>  oxygen via nasal cannula 6 L, 3 >>2 to room air this a.m. satting 96% -Acute renal failure due to septic shock, S/P hemodialysis  on  8/18, 8/19 , 8/20 Nephrology following planning for no further dialysis today  -Hypotension improved, off Levophed -BP stabilized PCCM has signed off  -Acute renal failure due to septic shock -last hemodialysis 04/18/2020  Lactic acid 5.7, 10.9, >>>6.1 >>>1.6.Marland Kitchen  -Off BiPAP, high flow oxygen, now down to 2 L O2 by nasal cannula satting 97%  -present on admission met the criteria for sepsis, did not develop septic shock -organ failure respiratory and kidney failure -needing hemodialysis  -Source of infection is diverticular abscess status post CT-guided drainage.   Pelvic drainage catheter in stable position.  Surgery following.   Repeat CT scan shows on 8/16 no worsening. -Consult PCCM, general surgery, IR, nephrology following closely  ID following Worsening leukocytosis 15.2, 22.6, >>> 24.8 >>22.6  >>       today Remains afebrile, mildly hypotensive -was on Flagyl and Rocephin >>> has been switched to Meropenem  -Gallbladder/abscess  --fluid aerobic/anaerobic cultures drawn Klebsiella pneumonia, pseudomonas aeruginosa, E. Coli Klebsiella -resistant to ampicillin sulbactam    Pseudomonas vaginosis pansensitive E. Coli -ESBL only sensitive to imipenem and gentamicin    Lactic acidosis -Due to sepsis septic/septic shock, improved  Acute hypoxic respiratory failure secondary to COPD exacerbation -Improved, remains on room air, satting 94% Patient refused BiPAP, supplemental 6 L, 3 L, 2 L, now on room  PCCM was following -signed off -Now supplement O2 as needed -Tapering down steroids, continue DuoNeb    Acute kidney failure -due to sepsis shock-hypertension  - ATN Hemodialysis initiated on 8/18/202, 8/19, 8/20 >>> responded well, monitoring -Patient was severely oliguric  --monitor urine output -No anticipation for hemodialysis today  Foley catheter placed on 8/17 per surgery/PCCM Creatinine worsened from 0.8->1.28-> 1.78-> 3.23>>4.48 >> 4.51 >> 3.89 >> 3.89  Nephrology  -following very closely  IV fluids were discontinued, initiated Lasix  A. fib with RVR -Remains in A. fib with RVR -Remains on amiodarone drip -Appreciate cardiology following, appreciate input -Intolerable to Cardizem due to hypotension -Cardiology following -Remains on amiodarone drip  -Withholding anticoagulation at this time due to severe infection, poor drain in place Risk of bleeding outweighs the benefit    Acute liver failure -Due to sepsis/septic shock LFTs are improving, monitoring AST of 1634  >> 791  >> 94 >>   ALT 853 >>  1153 >> 419 Alk phos 60, 84 >> 127 >> 113 Avoid hepatotoxic and nephrotoxic medications  Acute sigmoid diverticulitis and diverticular abscess s/p CT-guided drainage by IR on 8/16 -Drain in place -markedly reduced output, for a mild now IR consulted, status post CT-guided gallbladder drain -Continue IV antibiotics    Morbid obesity Complicates overall care Body mass index is 43.27 kg/m.    Metabolic encephalopathy -Much improved awake alert now -Likely cause sepsis, hypoxia, metabolic and respiratory acidosis -We will continue with neurochecks   Ethics:palliative care c/s for GOC  -remains on partial code Prognosis remain poor due to comorbidities, not progressively declining due to sepsis, septic shock with multiorgan failure..    Status is: Inpatient  Dispo: The patient is from: Home              Anticipated d/c is to: Home              Anticipated d/c date is: > 3 days  Patient currently is not medically stable to d/c.    DVT prophylaxis:            enoxaparin (LOVENOX) injection 30 mg Start: 04/16/20 1000     Family Communication: discussed with Daughter   All the records are reviewed and case discussed with Care Management/Social Worker. Management plans discussed with the patient, family (Daughter at bedside) and they are in agreement.  CODE STATUS: Partial Code  TOTAL TIME TAKING CARE OF THIS PATIENT:  50 minutes.   More than 50% of the time was spent in counseling/coordination of care: YES  POSSIBLE D/C IN 3-4 DAYS, DEPENDING ON CLINICAL CONDITION.      DRUG ALLERGIES:   Allergies  Allergen Reactions  . Acetaminophen Nausea And Vomiting  . Ibuprofen Other (See Comments)    Reports causes bleeding  . Gabapentin Rash  . Morphine Itching and Rash  . Morphine And Related Itching  . Zanaflex  [Tizanidine] Rash   VITALS:  Blood pressure 112/67, pulse 94, temperature 98.5 F (36.9 C), temperature source Axillary, resp. rate (!) 22, height _0  (1.676 m), weight 121.6 kg, SpO2 94 %. PHYSICAL EXAMINATION:          Physical Exam:   General:   Much more awake, cooperative, no distress;   HEENT:  Normocephalic, PERRL, otherwise with in Normal limits   Neuro:  CNII-XII intact. , normal motor and sensation, reflexes intact   Lungs:   Clear to auscultation BL, Respirations unlabored, no wheezes / crackles  Cardio:    S1/S2, RRR, No murmure, No Rubs or Gallops   Abdomen:   Soft, non-tender, bowel sounds active all four quadrants,  no guarding or peritoneal signs.  Muscular skeletal:   3 generalized weaknesses Limited exam - in bed, able to move all 4 extremities, Normal strength,  2+ pulses,  symmetric, No pitting edema  Skin:  Dry, warm to touch, negative for any Rashes, No open wounds  Wounds: Please see nursing documentation          JP drain present in the left lower quadrant draining serosanguineous drainage        Foley inserted on 8/17 LABORATORY PANEL:  Female CBC Recent Labs  Lab 04/19/20 0458  WBC 24.7*  HGB 11.5*  HCT 31.3*  PLT 36*   ------------------------------------------------------------------------------------------------------------------ Chemistries  Recent Labs  Lab 04/19/20 0848  NA 136  K 5.5*  CL 100  CO2 18*  GLUCOSE 143*  BUN 61*  CREATININE 3.89*  CALCIUM 8.2*  MG 1.9  AST 94*  ALT 419*  ALKPHOS 113  BILITOT 1.2    RADIOLOGY:  DG Abd 1 View  Result Date: 04/14/2020 CLINICAL DATA:  Abdominal pain.  Pelvic abscess drainage today. EXAM: ABDOMEN - 1 VIEW COMPARISON:  CT abdomen pelvis from same day. FINDINGS: The bowel gas pattern is normal. No intraperitoneal free air peer no radio-opaque calculi or other significant radiographic abnormality are seen. No acute osseous abnormality. Prior lumbar fusion. IMPRESSION: 1. Negative. Electronically Signed   By: Titus Dubin M.D.   On: 04/14/2020 14:37   CT ABDOMEN PELVIS W CONTRAST  Result Date: 04/14/2020 CLINICAL DATA:  Peritonitis or perforation suspected Patient with history of perforated diverticulitis post percutaneous drain placement today. EXAM: CT ABDOMEN AND PELVIS WITH CONTRAST TECHNIQUE: Multidetector CT imaging of the abdomen and pelvis was performed using the standard protocol following bolus administration of intravenous contrast. CONTRAST:  96m OMNIPAQUE IOHEXOL 300 MG/ML  SOLN COMPARISON:  Two prior abdominopelvic CT earlier this  day, prior to drain placement.  IMPRESSION: 1. Interval percutaneous drainage of pelvic abscess. No residual fluid collection. 2. Small amount of non organized free fluid in the pelvis, pericolic gutters, and perihepatic right upper quadrant, slight increase from pre drainage CT earlier today. No evidence of new abscess. No free air or interval perforation. 3. Heterogeneous enhancement of both kidneys, nonspecific but can be seen with pyelonephritis. 4. Small pleural effusions are new from prior exam. Adjacent compressive atelectasis. Electronically Signed   By: Keith Rake M.D.   On: 04/14/2020 22:22   DG Abd Portable 2V  Result Date: 04/15/2020 CLINICAL DATA:  Diverticulitis.  Abdominal pain and distention. EXAM: PORTABLE ABDOMEN - 2 VIEW COMPARISON:  CT 04/14/2020. FINDINGS: Pelvic drainage catheter again noted. No bowel distention. Stool noted throughout the colon. No free air. Bibasilar atelectasis.  Degenerative change lumbar spine and both hips. Contrast in the bladder from prior CT. IMPRESSION: 1.  Pelvic drainage catheter in stable position. 2. No acute abdominal abnormality identified. No bowel distention or free air. 3.  Bibasilar atelectasis. Electronically Signed   By: Marcello Moores  Register   On: 04/15/2020 08:07      Deatra James M.D on 04/21/2020 at 11:25 AM  [facility]   CC: Primary care physician; Theotis Burrow, MD

## 2020-04-21 NOTE — Progress Notes (Signed)
[facility] SURGICAL PROGRESS NOTE (cpt [phone])  Hospital Day(s): 7.   Post op day(s):  Marland Kitchen   Interval History:  Patient seen and examined no acute events or new complaints overnight.  Patient reports she is feeling better and she looks the best I have seen her this hospitalization, She denies abdominal pain, fever, chills, nausea, or emesi. Continues to maintain hemodynamics, atrial fibrillation with rate better control, remains on amiodarone at 60 mg/hr No vasopressors No new labs this morning Continues to have low UO; on HD JP with low output; remains seropurulent On soft diet; tolerating well  Review of Systems:  Constitutional: denies fever, chills  HEENT: denies cough or congestion  Respiratory: denies any shortness of breath  Cardiovascular: denies chest pain or palpitations  Gastrointestinal: denies abdominal pain, N/V, or diarrhea/and bowel function as per interval history Genitourinary: denies burning with urination or urinary frequency Musculoskeletal: denies pain, decreased motor or sensation   Vital signs in last 24 hours: [min-max] current  Temp:  [97.9 F (36.6 C)-99.3 F (37.4 C)] 98.5 F (36.9 C) (08/23 0200) Pulse Rate:  [86-101] 94 (08/23 0600) Resp:  [12-25] 22 (08/23 0600) BP: (96-127)/(49-81) 112/67 (08/23 0600) SpO2:  [92 %-100 %] 95 % (08/23 0600)     Height: 5\' 6"  (167.6 cm) Weight: 121.6 kg BMI (Calculated): 43.29   Intake/Output last 2 shifts:  08/22 0701 - 08/23 0700 In: 1088.2 [I.V.:758.2; IV Piggyback:330] Out: 194 [Urine:190; Drains:4]   Physical Exam:  Constitutional: alert, cooperative and no distress, frustrated by still being hospitalized HENT: normocephalic without obvious abnormality  Eyes: PERRL, EOM's grossly intact and symmetric  Respiratory: breathing certainly less labored, on RA, still with some wheezing Cardiovascular: regular rate, irregular Gastrointestinal: obese, soft, non-tender, and non-distended, no  rebound/guarding/peritonitis. JP in LLQ with seropurulent drainage, this appears to be slowing Genitourinary:Foley in place, minimalverydark urine in bag Musculoskeletal:+ 1 pitting edema to lower extremities, HD catheter in right groin   Labs:  CBC Latest Ref Rng & Units 04/19/2020 04/18/2020 04/17/2020  WBC 4.0 - 10.5 K/uL 24.7(H) 24.8(H) 22.6(H)  Hemoglobin 12.0 - 15.0 g/dL 11.5(L) 12.0 11.5(L)  Hematocrit 36 - 46 % 31.3(L) 32.4(L) 31.5(L)  Platelets 150 - 400 K/uL 36(L) 31(L) 49(L)   CMP Latest Ref Rng & Units 04/19/2020 04/18/2020 04/17/2020  Glucose 70 - 99 mg/dL 143(H) 126(H) 110(H)  BUN 6 - 20 mg/dL 61(H) 63(H) 63(H)  Creatinine 0.44 - 1.00 mg/dL 3.89(H) 4.21(H) 4.51(H)  Sodium 135 - 145 mmol/L 136 136 136  Potassium 3.5 - 5.1 mmol/L 5.5(H) 4.8 4.9  Chloride 98 - 111 mmol/L 100 101 102  CO2 22 - 32 mmol/L 18(L) 20(L) 17(L)  Calcium 8.9 - 10.3 mg/dL 8.2(L) 7.3(L) 6.9(L)  Total Protein 6.5 - 8.1 g/dL 5.2(L) 5.5(L) 5.5(L)  Total Bilirubin 0.3 - 1.2 mg/dL 1.2 0.7 0.9  Alkaline Phos 38 - 126 U/L 113 120 127(H)  AST 15 - 41 U/L 94(H) 247(H) 791(H)  ALT 0 - 44 U/L 419(H) 746(H) 1,153(H)     Imaging studies: No new pertinent imaging studies   Assessment/Plan: (ICD-10's: K42.20) 59 y.o. female who appears markedly improved this morning without complaints of abdominal pain and improvement in HR otherwise she continues to maintain hemodynamics, admitted with septic shock secondary tosigmoid diverticulitis with abscess s/p percutaneous drainage on 08/16 andshe continues to bewithout any evidence of peritonitis/pneumoperitoneum/pneumatosis on examination or imaging,complicated by pertinent comorbidities includingmarked COPD   - Okay to continue regular diet  - Continue IV Abx;switched toMeropenemand andifungalinon  08/17; ID following  - Monitor abdominal examination; leukocytosis; lactic acidosis, fever curve   - No emergent surgical intervention    - Pain control prn;  antiemetics prn - Maintain JP drain; record output  - Further management per primary service; transfer to floor when ready   All of the above findings and recommendations were discussed with the patient, and the medical team, and all of patient's questions were answered to her expressed satisfaction.  -- Edison Simon, PA-C [facility] 04/21/2020, 7:29 AM [phone] M-F: 7am - 4pm

## 2020-04-21 NOTE — Progress Notes (Signed)
Patients daughter updated via telephone.

## 2020-04-22 ENCOUNTER — Inpatient Hospital Stay: Payer: Medicare Other

## 2020-04-22 DIAGNOSIS — N179 Acute kidney failure, unspecified: Secondary | ICD-10-CM | POA: Diagnosis not present

## 2020-04-22 DIAGNOSIS — R6521 Severe sepsis with septic shock: Secondary | ICD-10-CM | POA: Diagnosis not present

## 2020-04-22 DIAGNOSIS — A419 Sepsis, unspecified organism: Secondary | ICD-10-CM | POA: Diagnosis not present

## 2020-04-22 DIAGNOSIS — K5720 Diverticulitis of large intestine with perforation and abscess without bleeding: Secondary | ICD-10-CM | POA: Diagnosis not present

## 2020-04-22 LAB — COMPREHENSIVE METABOLIC PANEL
ALT: 113 U/L — ABNORMAL HIGH (ref 0–44)
AST: 40 U/L (ref 15–41)
Albumin: 2.1 g/dL — ABNORMAL LOW (ref 3.5–5.0)
Alkaline Phosphatase: 107 U/L (ref 38–126)
Anion gap: 20 — ABNORMAL HIGH (ref 5–15)
BUN: 95 mg/dL — ABNORMAL HIGH (ref 6–20)
CO2: 19 mmol/L — ABNORMAL LOW (ref 22–32)
Calcium: 7.9 mg/dL — ABNORMAL LOW (ref 8.9–10.3)
Chloride: 96 mmol/L — ABNORMAL LOW (ref 98–111)
Creatinine, Ser: 4.61 mg/dL — ABNORMAL HIGH (ref 0.44–1.00)
GFR calc Af Amer: 11 mL/min — ABNORMAL LOW (ref >60)
GFR calc non Af Amer: 10 mL/min — ABNORMAL LOW (ref >60)
Glucose, Bld: 198 mg/dL — ABNORMAL HIGH (ref 70–99)
Potassium: 5 mmol/L (ref 3.5–5.1)
Sodium: 135 mmol/L (ref 135–145)
Total Bilirubin: 0.7 mg/dL (ref 0.3–1.2)
Total Protein: 5.3 g/dL — ABNORMAL LOW (ref 6.5–8.1)

## 2020-04-22 LAB — FUNGITELL, SERUM: Fungitell Result: 202 pg/mL — ABNORMAL HIGH (ref <80)

## 2020-04-22 LAB — CBC WITH DIFFERENTIAL/PLATELET
Abs Immature Granulocytes: 1.82 10*3/uL — ABNORMAL HIGH (ref 0.00–0.07)
Basophils Absolute: 0.1 10*3/uL (ref 0.0–0.1)
Basophils Relative: 0 %
Eosinophils Absolute: 0 10*3/uL (ref 0.0–0.5)
Eosinophils Relative: 0 %
HCT: 32.9 % — ABNORMAL LOW (ref 36.0–46.0)
Hemoglobin: 11.4 g/dL — ABNORMAL LOW (ref 12.0–15.0)
Immature Granulocytes: 5 %
Lymphocytes Relative: 2 %
Lymphs Abs: 0.5 10*3/uL — ABNORMAL LOW (ref 0.7–4.0)
MCH: 30.4 pg (ref 26.0–34.0)
MCHC: 34.7 g/dL (ref 30.0–36.0)
MCV: 87.7 fL (ref 80.0–100.0)
Monocytes Absolute: 0.7 10*3/uL (ref 0.1–1.0)
Monocytes Relative: 2 %
Neutro Abs: 32 10*3/uL — ABNORMAL HIGH (ref 1.7–7.7)
Neutrophils Relative %: 91 %
Platelets: 130 10*3/uL — ABNORMAL LOW (ref 150–400)
RBC: 3.75 MIL/uL — ABNORMAL LOW (ref 3.87–5.11)
RDW: 19.9 % — ABNORMAL HIGH (ref 11.5–15.5)
WBC: 35.1 10*3/uL — ABNORMAL HIGH (ref 4.0–10.5)
nRBC: 0.3 % — ABNORMAL HIGH (ref 0.0–0.2)

## 2020-04-22 LAB — MAGNESIUM: Magnesium: 2.3 mg/dL (ref 1.7–2.4)

## 2020-04-22 LAB — PHOSPHORUS: Phosphorus: 8.5 mg/dL — ABNORMAL HIGH (ref 2.5–4.6)

## 2020-04-22 LAB — HEPARIN INDUCED PLATELET AB (HIT ANTIBODY): Heparin Induced Plt Ab: 0.071 OD (ref 0.000–0.400)

## 2020-04-22 MED ORDER — AMIODARONE IV BOLUS ONLY 150 MG/100ML
150.0000 mg | Freq: Once | INTRAVENOUS | Status: AC
  Administered 2020-04-22: 150 mg via INTRAVENOUS
  Filled 2020-04-22: qty 100

## 2020-04-22 MED ORDER — LIDOCAINE VISCOUS HCL 2 % MT SOLN
15.0000 mL | Freq: Four times a day (QID) | OROMUCOSAL | Status: DC | PRN
  Administered 2020-04-28 – 2020-05-03 (×3): 15 mL via OROMUCOSAL
  Filled 2020-04-22 (×4): qty 15

## 2020-04-22 MED ORDER — MAGIC MOUTHWASH
5.0000 mL | Freq: Four times a day (QID) | ORAL | Status: DC | PRN
  Filled 2020-04-22: qty 10

## 2020-04-22 MED ORDER — MENTHOL 3 MG MT LOZG
1.0000 | LOZENGE | OROMUCOSAL | Status: DC | PRN
  Administered 2020-04-22 – 2020-04-24 (×2): 3 mg via ORAL
  Filled 2020-04-22: qty 9

## 2020-04-22 MED ORDER — IPRATROPIUM-ALBUTEROL 0.5-2.5 (3) MG/3ML IN SOLN
3.0000 mL | Freq: Four times a day (QID) | RESPIRATORY_TRACT | Status: DC
  Administered 2020-04-22 – 2020-04-24 (×5): 3 mL via RESPIRATORY_TRACT
  Filled 2020-04-22 (×6): qty 3

## 2020-04-22 MED ORDER — AMIODARONE IV BOLUS ONLY 150 MG/100ML: 150.0000 mg | Freq: Once | INTRAVENOUS | Status: DC

## 2020-04-22 MED ORDER — METHYLPREDNISOLONE SODIUM SUCC 40 MG IJ SOLR
40.0000 mg | Freq: Two times a day (BID) | INTRAMUSCULAR | Status: DC
  Administered 2020-04-22 – 2020-04-23 (×2): 40 mg via INTRAVENOUS
  Filled 2020-04-22 (×2): qty 1

## 2020-04-22 NOTE — Progress Notes (Signed)
[facility]  ROUNDING NOTE   Subjective:  Patient did undergo dialysis treatment yesterday. Some issues with clotting of the circuit noted yesterday. Phosphorus noted to be high yesterday at 8.5.   Objective:  Vital signs in last 24 hours:  Temp:  [97.7 F (36.5 C)-99 F (37.2 C)] 99 F (37.2 C) (08/24 0000) Pulse Rate:  [55-110] 97 (08/24 0600) Resp:  [12-22] 14 (08/24 0600) BP: (102-140)/(29-97) 123/72 (08/24 0600) SpO2:  [92 %-99 %] 97 % (08/24 0721)  Weight change:  Filed Weights   04/13/20 2127 04/14/20 1822  Weight: 117.9 kg 121.6 kg    Intake/Output: I/O last 3 completed shifts: In: 1817.7 [P.O.:480; I.V.:750.7; IV Piggyback:587] Out: 2426 [Urine:310; Drains:90; Other:653]   Intake/Output this shift:  No intake/output data recorded.  Physical Exam: General:  No acute distress  Head:  Normocephalic, atraumatic. Moist oral mucosal membranes  Eyes:  Anicteric  Neck:  Supple  Lungs:   Scattered rhonchi, normal effort  Heart:  S1S2 no rubs  Abdomen:   Mild distention  Extremities:  No peripheral edema.  Neurologic:  Awake, alert, following commands  Skin:  No lesions  Access:  Left femoral dialysis catheter    Basic Metabolic Panel: Recent Labs  Lab 04/16/20 0348 04/16/20 0348 04/16/20 0900 04/16/20 0900 04/17/20 0603 04/18/20 0442 04/19/20 0848 04/22/20 0436  NA 135  --  135  --  136 136 136  --   K 5.5*  --  4.9  --  4.9 4.8 5.5*  --   CL 102  --  101  --  102 101 100  --   CO2 16*  --  14*  --  17* 20* 18*  --   GLUCOSE 145*  --  172*  --  110* 126* 143*  --   BUN 64*  --  66*  --  63* 63* 61*  --   CREATININE 4.48*  --  4.65*  --  4.51* 4.21* 3.89*  --   CALCIUM 6.6*   < > 6.7*   < > 6.9* 7.3* 8.2*  --   MG 1.9  --   --   --  1.7 1.9 1.9 2.3  PHOS 8.4*  --   --   --  5.3* 5.9* 6.2* 8.5*   < > = values in this interval not displayed.    Liver Function Tests: Recent Labs  Lab 04/16/20 0348 04/17/20 0603 04/18/20 0442  04/19/20 0848  AST 2,020* 791* 247* 94*  ALT 1,556* 1,153* 746* 419*  ALKPHOS 84 127* 120 113  BILITOT 0.8 0.9 0.7 1.2  PROT 5.3* 5.5* 5.5* 5.2*  ALBUMIN 2.2* 2.1* 2.0* 1.9*   No results for input(s): LIPASE, AMYLASE in the last 168 hours. No results for input(s): AMMONIA in the last 168 hours.  CBC: Recent Labs  Lab 04/16/20 0348 04/17/20 0603 04/18/20 0442 04/19/20 0458  WBC 15.2* 22.6* 24.8* 24.7*  HGB 11.5* 11.5* 12.0 11.5*  HCT 35.2* 31.5* 32.4* 31.3*  MCV 92.9 84.7 83.5 82.2  PLT 94* 49* 31* 36*    Cardiac Enzymes: No results for input(s): CKTOTAL, CKMB, CKMBINDEX, TROPONINI in the last 168 hours.  BNP: Invalid input(s): POCBNP  CBG: No results for input(s): GLUCAP in the last 168 hours.  Microbiology: Results for orders placed or performed during the hospital encounter of 04/14/20  SARS Coronavirus 2 by RT PCR (hospital order, performed in [facility] hospital lab) Nasopharyngeal Nasopharyngeal Swab     Status: None  Collection Time: 04/14/20  2:17 AM   Specimen: Nasopharyngeal Swab  Result Value Ref Range Status   SARS Coronavirus 2 NEGATIVE NEGATIVE Final    Comment: (NOTE) SARS-CoV-2 target nucleic acids are NOT DETECTED.  The SARS-CoV-2 RNA is generally detectable in upper and lower respiratory specimens during the acute phase of infection. The lowest concentration of SARS-CoV-2 viral copies this assay can detect is 250 copies / mL. A negative result does not preclude SARS-CoV-2 infection and should not be used as the sole basis for treatment or other patient management decisions.  A negative result may occur with improper specimen collection / handling, submission of specimen other than nasopharyngeal swab, presence of viral mutation(s) within the areas targeted by this assay, and inadequate number of viral copies (<250 copies / mL). A negative result must be combined with clinical observations, patient history, and epidemiological  information.  Fact Sheet for Patients:   StrictlyIdeas.no  Fact Sheet for Healthcare Providers: BankingDealers.co.za  This test is not yet approved or  cleared by the [place] FDA and has been authorized for detection and/or diagnosis of SARS-CoV-2 by FDA under an Emergency Use Authorization (EUA).  This EUA will remain in effect (meaning this test can be used) for the duration of the COVID-19 declaration under Section 564(b)(1) of the Act, 21 U.S.C. section 360bbb-3(b)(1), unless the authorization is terminated or revoked sooner.  Performed at [facility], [address] [place], [place] [address]   CULTURE, BLOOD (ROUTINE X 2) w Reflex to ID Panel     Status: None   Collection Time: 04/14/20  8:14 AM   Specimen: BLOOD  Result Value Ref Range Status   Specimen Description BLOOD LEFT [facility]  Final   Special Requests   Final    BOTTLES DRAWN AEROBIC AND ANAEROBIC Blood Culture adequate volume   Culture   Final    NO GROWTH 5 DAYS Performed at [facility], [address] [place], [place] [address]    Report Status 04/19/2020 FINAL  Final  CULTURE, BLOOD (ROUTINE X 2) w Reflex to ID Panel     Status: None   Collection Time: 04/14/20  8:14 AM   Specimen: BLOOD  Result Value Ref Range Status   Specimen Description BLOOD LEFT WRIST  Final   Special Requests   Final    BOTTLES DRAWN AEROBIC AND ANAEROBIC Blood Culture adequate volume   Culture   Final    NO GROWTH 5 DAYS Performed at [facility], [address], [place], [place] [address]    Report Status 04/19/2020 FINAL  Final  Aerobic/Anaerobic Culture (surgical/deep wound)     Status: None   Collection Time: 04/14/20 11:14 AM   Specimen: Abscess  Result Value Ref Range Status   Specimen Description   Final    ABSCESS Performed at [facility], [address] [place], [place] [address]    Special Requests   Final     NONE Performed at [facility], [place], [place] [address]    Gram Stain   Final    NO WBC SEEN ABUNDANT GRAM NEGATIVE RODS FEW GRAM POSITIVE COCCI FEW GRAM POSITIVE RODS Performed at [facility], [place] [address], [place], [place] [address]    Culture   Final    ABUNDANT KLEBSIELLA PNEUMONIAE ABUNDANT PSEUDOMONAS AERUGINOSA MODERATE ESCHERICHIA COLI Confirmed Extended Spectrum Beta-Lactamase Producer (ESBL).  In bloodstream infections from ESBL organisms, carbapenems are preferred over piperacillin/tazobactam. They are shown  to have a lower risk of mortality. MIXED ANAEROBIC FLORA PRESENT.  CALL LAB IF FURTHER IID REQUIRED.    Report Status 04/20/2020 FINAL  Final   Organism ID, Bacteria KLEBSIELLA PNEUMONIAE  Final   Organism ID, Bacteria PSEUDOMONAS AERUGINOSA  Final   Organism ID, Bacteria ESCHERICHIA COLI  Final      Susceptibility   Escherichia coli - MIC*    AMPICILLIN >=32 RESISTANT Resistant     CEFAZOLIN >=64 RESISTANT Resistant     CEFEPIME 16 RESISTANT Resistant     CEFTAZIDIME RESISTANT Resistant     CEFTRIAXONE >=64 RESISTANT Resistant     CIPROFLOXACIN >=4 RESISTANT Resistant     GENTAMICIN <=1 SENSITIVE Sensitive     IMIPENEM <=0.25 SENSITIVE Sensitive     TRIMETH/SULFA >=320 RESISTANT Resistant     AMPICILLIN/SULBACTAM >=32 RESISTANT Resistant     PIP/TAZO 8 SENSITIVE Sensitive     * MODERATE ESCHERICHIA COLI   Klebsiella pneumoniae - MIC*    AMPICILLIN >=32 RESISTANT Resistant     CEFAZOLIN <=4 SENSITIVE Sensitive     CEFEPIME <=0.12 SENSITIVE Sensitive     CEFTAZIDIME <=1 SENSITIVE Sensitive     CEFTRIAXONE <=0.25 SENSITIVE Sensitive     CIPROFLOXACIN <=0.25 SENSITIVE Sensitive     GENTAMICIN <=1 SENSITIVE Sensitive     IMIPENEM <=0.25 SENSITIVE Sensitive     TRIMETH/SULFA <=20 SENSITIVE Sensitive     AMPICILLIN/SULBACTAM 16 INTERMEDIATE Intermediate     PIP/TAZO 8 SENSITIVE Sensitive     * ABUNDANT KLEBSIELLA PNEUMONIAE    Pseudomonas aeruginosa - MIC*    CEFTAZIDIME 4 SENSITIVE Sensitive     CIPROFLOXACIN <=0.25 SENSITIVE Sensitive     GENTAMICIN 2 SENSITIVE Sensitive     IMIPENEM 2 SENSITIVE Sensitive     PIP/TAZO 8 SENSITIVE Sensitive     CEFEPIME 4 SENSITIVE Sensitive     * ABUNDANT PSEUDOMONAS AERUGINOSA  MRSA PCR Screening     Status: None   Collection Time: 04/14/20  6:19 PM   Specimen: Nasopharyngeal  Result Value Ref Range Status   MRSA by PCR NEGATIVE NEGATIVE Final    Comment:        The GeneXpert MRSA Assay (FDA approved for NASAL specimens only), is one component of a comprehensive MRSA colonization surveillance program. It is not intended to diagnose MRSA infection nor to guide or monitor treatment for MRSA infections. Performed at [facility], [address], [place], [place] [address]   Urine Culture     Status: None   Collection Time: 04/15/20  4:28 AM   Specimen: Urine, Random  Result Value Ref Range Status   Specimen Description   Final    URINE, RANDOM Performed at [facility], [address] [place], [place] [address]    Special Requests   Final    NONE Performed at [facility], [address], [place], [place] [address]    Culture   Final    NO GROWTH Performed at [facility], [place] [address] [place], [place] [address]    Report Status 04/16/2020 FINAL  Final    Coagulation Studies: No results for input(s): LABPROT, INR in the last 72 hours.  Urinalysis: No results for input(s): COLORURINE, LABSPEC, PHURINE, GLUCOSEU, HGBUR, BILIRUBINUR, KETONESUR, PROTEINUR, UROBILINOGEN, NITRITE, LEUKOCYTESUR in the last 72 hours.  Invalid input(s): APPERANCEUR    Imaging: No results found.   Medications:   . sodium chloride 10 mL/hr at 04/16/20 1200  . meropenem (MERREM) IV Stopped (04/21/20 2311)  .  norepinephrine (LEVOPHED) Adult infusion     . acidophilus  1 capsule Oral Daily  . amiodarone  400 mg Oral BID  .  budesonide (PULMICORT) nebulizer solution  0.5 mg Nebulization BID  . Chlorhexidine Gluconate Cloth  6 each Topical Daily  . guaiFENesin-dextromethorphan  10 mL Oral Q8H  . ipratropium-albuterol  3 mL Nebulization Q4H  . methylPREDNISolone (SOLU-MEDROL) injection  40 mg Intravenous Q6H  . mometasone-formoterol  2 puff Inhalation BID  . pantoprazole (PROTONIX) IV  40 mg Intravenous Daily  . sodium chloride flush  5 mL Intracatheter Q8H  . sodium zirconium cyclosilicate  10 g Oral Daily   acetaminophen **OR** acetaminophen, ALPRAZolam, heparin NICU/SCN flush, HYDROcodone-acetaminophen, HYDROmorphone (DILAUDID) injection, ipratropium-albuterol, labetalol, magnesium hydroxide, phenol  Assessment/ Plan:  59 y.o. female with past medical history of HIV, COPD, hypertension who was admitted with shortness of breath, colonic diverticular abscess, diverticulitis and subsequently found to have acute kidney injury.  1.  Acute kidney injury secondary to ATN with multiple factors from severe sepsis and multiple IV contrast exposures.  Urine output was only 185 cc yesterday.  She underwent dialysis treatment yesterday.  Therefore no acute indication for dialysis today.  Recheck serum electrolytes today.  2.  Hyperkalemia.  Potassium 5.5 at last check.  We will recheck serum electrolytes today.  3.  Metabolic acidosis.  Recheck serum bicarbonate today.     LOS: 8 Melanie Bowen 8/24/20218:31 AM

## 2020-04-22 NOTE — Progress Notes (Signed)
[facility] SURGICAL PROGRESS NOTE (cpt [phone])  Hospital Day(s): 8.   Interval History:  Patient seen and examined no acute events or new complaints overnight.  Patient is in best spirits ive seen her this admission, abdominal soreness but improved No fever, chills, nausea, emesis. She reports her breathing is improved She continues to maintain hemodynamics reasonably well, episodes of atrial fibrillation improving, now on PO amiodarone 400, no vasopressor Still with hyperphosphatemia; likely in setting of renal failure No new labs this morning Continues to have low UO - 185 ccs; on HD JP with low output; remains seropurulent Cx grew out pseudomonas, kleb and ESBL e.coli; ID following; andifungalin stopped On soft diet; tolerating well  Review of Systems:  Constitutional: denies fever, chills  HEENT: denies cough or congestion  Respiratory: denies any shortness of breath  Cardiovascular: denies chest pain or palpitations  Gastrointestinal: + abdominal pain (improved), denied N/V, or diarrhea/and bowel function as per interval history Genitourinary: denies burning with urination or urinary frequency Musculoskeletal: denies pain, decreased motor or sensation  Vital signs in last 24 hours: [min-max] current  Temp:  [97.7 F (36.5 C)-99 F (37.2 C)] 99 F (37.2 C) (08/24 0000) Pulse Rate:  [55-110] 97 (08/24 0600) Resp:  [12-23] 14 (08/24 0600) BP: (102-140)/(29-97) 123/72 (08/24 0600) SpO2:  [92 %-100 %] 97 % (08/24 0721)     Height: 5\' 6"  (167.6 cm) Weight: 121.6 kg BMI (Calculated): 43.29   Intake/Output last 2 shifts:  08/23 0701 - 08/24 0700 In: 1223 [P.O.:480; I.V.:386; IV Piggyback:357] Out: 928 [Urine:185; Drains:90]   Physical Exam:  Constitutional: alert, cooperative and no distress, she is in best spirits ive seen this admission HENT: normocephalic without obvious abnormality  Eyes: PERRL, EOM's grossly intact and symmetric  Respiratory: breathing  certainly less labored, on RA, still with some wheezing Cardiovascular: regular rate, irregular Gastrointestinal: obese, soft, non-tender, and non-distended, no rebound/guarding/peritonitis. JP in LLQ with seropurulent drainage, this appears to be slowing Genitourinary:Foley in place, minimalverydark urine in bag Musculoskeletal:+ 1 pitting edema to lower extremities, HD catheter in right groin  Labs:  CBC Latest Ref Rng & Units 04/19/2020 04/18/2020 04/17/2020  WBC 4.0 - 10.5 K/uL 24.7(H) 24.8(H) 22.6(H)  Hemoglobin 12.0 - 15.0 g/dL 11.5(L) 12.0 11.5(L)  Hematocrit 36 - 46 % 31.3(L) 32.4(L) 31.5(L)  Platelets 150 - 400 K/uL 36(L) 31(L) 49(L)   CMP Latest Ref Rng & Units 04/19/2020 04/18/2020 04/17/2020  Glucose 70 - 99 mg/dL 143(H) 126(H) 110(H)  BUN 6 - 20 mg/dL 61(H) 63(H) 63(H)  Creatinine 0.44 - 1.00 mg/dL 3.89(H) 4.21(H) 4.51(H)  Sodium 135 - 145 mmol/L 136 136 136  Potassium 3.5 - 5.1 mmol/L 5.5(H) 4.8 4.9  Chloride 98 - 111 mmol/L 100 101 102  CO2 22 - 32 mmol/L 18(L) 20(L) 17(L)  Calcium 8.9 - 10.3 mg/dL 8.2(L) 7.3(L) 6.9(L)  Total Protein 6.5 - 8.1 g/dL 5.2(L) 5.5(L) 5.5(L)  Total Bilirubin 0.3 - 1.2 mg/dL 1.2 0.7 0.9  Alkaline Phos 38 - 126 U/L 113 120 127(H)  AST 15 - 41 U/L 94(H) 247(H) 791(H)  ALT 0 - 44 U/L 419(H) 746(H) 1,153(H)     Imaging studies: No new pertinent imaging studies   Assessment/Plan: (ICD-10's: K76.20) 59 y.o. female who appears markedly improved this morning without complaints of abdominal pain and improvement in HR otherwise she continues to maintain hemodynamics, admitted with septic shock secondary tosigmoid diverticulitis with abscess s/p percutaneous drainage on 08/16andshe continues to bewithout any evidence of peritonitis/pneumoperitoneum/pneumatosis on examination or  imaging,complicated by pertinent comorbidities includingmarked COPD   - Okay to continue regular diet             - Continue IV Abx (meropenem); ID following; Cx with  pseudomonas, kleb and ESBL e.coli; andifungalin stopped             - Monitor abdominal examination; leukocytosis; lactic acidosis, fever curve              - No emergent surgical intervention               - Pain control prn; antiemetics prn - Maintain JP drain; record output  - Appreciate nephrology/cardiology assistance and recommendations             - Further management per primary service; transfer to floor when beds become available  All of the above findings and recommendations were discussed with the patient, and the medical team, and all of patient's questions were answered to her expressed satisfaction.   -- Edison Simon, PA-C [facility] 04/22/2020, 7:30 AM [phone] M-F: 7am - 4pm

## 2020-04-22 NOTE — Progress Notes (Signed)
Id Patient Vitals for the past 24 hrs:  BP Temp Temp src Pulse Resp SpO2  04/22/20 1205 -- -- -- -- -- 97 %  04/22/20 1100 110/73 -- -- 90 18 97 %  04/22/20 1000 115/67 -- -- (!) 101 14 98 %  04/22/20 0844 -- 98.2 F (36.8 C) Axillary -- -- --  04/22/20 0800 127/76 -- -- (!) 104 20 97 %  04/22/20 0721 -- -- -- -- -- 97 %  04/22/20 0600 123/72 -- -- 97 14 97 %  04/22/20 0400 109/73 -- -- 93 13 96 %  04/22/20 0200 (!) 131/91 -- -- 86 12 98 %  04/22/20 0000 118/66 99 F (37.2 C) Axillary 99 12 98 %  04/21/20 2200 129/78 98.9 F (37.2 C) Oral (!) 106 17 99 %  04/21/20 1900 112/64 98.8 F (37.1 C) Oral (!) 110 (!) 21 95 %  04/21/20 1830 -- 97.7 F (36.5 C) -- -- -- --  04/21/20 1800 110/85 -- -- (!) 108 20 95 %  04/21/20 1745 111/78 -- -- -- -- --  04/21/20 1739 -- -- -- (!) 55 18 96 %  04/21/20 1712 (!) 117/97 -- -- 97 13 97 %  04/21/20 1711 110/82 -- -- 98 13 97 %  04/21/20 1600 109/60 -- -- (!) 102 17 99 %  04/21/20 1550 -- -- -- -- -- 94 %  04/21/20 1545 102/66 -- -- 98 16 94 %  04/21/20 1500 121/87 -- -- (!) 106 (!) 21 94 %  04/21/20 1416 108/81 -- -- 93 13 92 %  04/21/20 1414 113/87 -- -- 93 13 93 %  04/21/20 1400 (!) 108/29 -- -- 93 13 94 %  04/21/20 1350 120/76 -- -- -- -- --   O/e Pt stable/sleeping Chest b/l air entry Rt femoral Tachycardia - intermittently irregular   labs CBC Latest Ref Rng & Units 04/22/2020 04/19/2020 04/18/2020  WBC 4.0 - 10.5 K/uL 35.1(H) 24.7(H) 24.8(H)  Hemoglobin 12.0 - 15.0 g/dL 11.4(L) 11.5(L) 12.0  Hematocrit 36 - 46 % 32.9(L) 31.3(L) 32.4(L)  Platelets 150 - 400 K/uL 130(L) 36(L) 31(L)    CMP Latest Ref Rng & Units 04/22/2020 04/19/2020 04/18/2020  Glucose 70 - 99 mg/dL 198(H) 143(H) 126(H)  BUN 6 - 20 mg/dL 95(H) 61(H) 63(H)  Creatinine 0.44 - 1.00 mg/dL 4.61(H) 3.89(H) 4.21(H)  Sodium 135 - 145 mmol/L 135 136 136  Potassium 3.5 - 5.1 mmol/L 5.0 5.5(H) 4.8  Chloride 98 - 111 mmol/L 96(L) 100 101  CO2 22 - 32 mmol/L 19(L) 18(L)  20(L)  Calcium 8.9 - 10.3 mg/dL 7.9(L) 8.2(L) 7.3(L)  Total Protein 6.5 - 8.1 g/dL 5.3(L) 5.2(L) 5.5(L)  Total Bilirubin 0.3 - 1.2 mg/dL 0.7 1.2 0.7  Alkaline Phos 38 - 126 U/L 107 113 120  AST 15 - 41 U/L 40 94(H) 247(H)  ALT 0 - 44 U/L 113(H) 419(H) 746(H)    Impression/recommendation  Septic shock resolved-secondary to perforated diverticulitisand peridiverticular abscess.has JP drain inserted on 04/14/20.  wasOnmeropenem and anidulafunginsince8/17/21. Latter stopped 04/21/20 . Because of pseudomonas, kleb and ESBL e.coli in the culture continue meropenem.  Repeat CT abdomen- abscess has resolved  Severe leucocytosis- likely related to rt femoral line as it was malfunctioning yesterday- could be thrombus- need to remove it  Transaminitis due to shock liver much improved  AKI due to septic shock - getting dialysis  Thrombocytopenia much improved  ;eucocytosis has worsened   HIV- well controlled in jan 2021 her Vl <20 and cd4 >  1000. She was getting care at [facility] and saw her provider last in June 2021  Alphonzo Cruise on hold. This has 4 different meds and we need to adjust the dose because of AKI.   Last CD4 count is 265 with 33% on 04/14/2020.  COPD- on tapering steroids Discussed the management with her nurse

## 2020-04-22 NOTE — Progress Notes (Signed)
Dr. Holley Raring was made aware by [facility] Brandi that infectious diseases felt like her trialysis catheter was source for increased WBC. Blood cultures were taken on the 16th final result after 5 days showed no growth. Per dr. Holley Raring do not removed trialysis catheter, md ordered two more blood culture. Will possible remove trialysis catheter tomorrow after dialysis

## 2020-04-22 NOTE — Evaluation (Signed)
Physical Therapy Evaluation Patient Details Name: Melanie Bowen MRN: [id number] DOB: 09/17/1960 Today's Date: 04/22/2020   History of Present Illness  Melanie Bowen is a 73yoF who comes to [facility] on 8/16 c LLQ ABD pain. Pt admitted c Acute sigmoid diverticulitis with diverticular abscess. PMH: COPD, asthma, HTN, diverticulosis, HIV, GERD, depression, bilat TKA, 3 level lumbar fusion, susequent lumbar hardware removal. Pt moved to ICU early in stay, inititated on HD on 04/16/20. P tha shad AF RVR issues.  Clinical Impression  Pt admitted with above diagnosis. Pt currently with functional limitations due to the deficits listed below (see "PT Problem List"). Upon entry, pt in bed, awake and agreeable to participate. The pt is alert and oriented x4, pleasant, conversational, and generally a good historian. Examination reveals significant weakness in BLE, RUE. Pt's strength most limited in the hips precluding performance of SLR bilat, inability to achieve or maintain hooklying without physical assist of BLE. RUE is edematous and weak, pt has been using nondominant hand for self feeding. Functional mobility assessment demonstrates increased effort/time requirements, poor tolerance, and need for heavy physical assistance, whereas the patient performed these at a higher level of independence PTA. Hr remains in 100s-110s BPM throughout, SpO2 >95% on 2L/min. DTR will be able to provide assistance at DC, but pt will require much more physical assist at DC that he will be able to provide. CIR stay would provide best chance at rapid return to PLOF prior to return to home. Pt will benefit from skilled PT intervention to increase independence and safety with basic mobility in preparation for discharge to the venue listed below.       Follow Up Recommendations CIR;Supervision for mobility/OOB;Supervision - Intermittent    Equipment Recommendations       Recommendations for Other Services       Precautions /  Restrictions Precautions Precautions: Fall Restrictions Weight Bearing Restrictions: No      Mobility  Bed Mobility Overal bed mobility: Needs Assistance Bed Mobility: Supine to Sit;Sit to Supine           General bed mobility comments: Very weak; unable to get into bridge position without assitance; unable to maintain position without assitance.  Transfers Overall transfer level:  (deferred, too weak to attempt)                  Ambulation/Gait                Stairs            Wheelchair Mobility    Modified Rankin (Stroke Patients Only)       Strength   MMT:  Hip flexion: 2+/5 bilat Knee extension: 4/5 bilat Knee flexion: 2+/5 bilat  Hip Extension 3/5 bilat Elbow Flexion: 3+/5 Rt; 5/5 Left Elbow Extension: 3+/5 Rt; 5/5 Left  Shoulder flexion: 2+/5 Rt; 4/5 left  Grips: moderate weakness Rt; WNL Left   *hip flexion ROM limited, firm end-feel at 90 degrees **RUE edematous from hand to shoulder          Pertinent Vitals/Pain Pain Assessment: No/denies pain    Home Living Family/patient expects to be discharged to:: Private residence (DTRs home) Living Arrangements: Alone Available Help at Discharge: Family Type of Home: House Home Access: Stairs to enter Entrance Stairs-Rails: Can reach both Entrance Stairs-Number of Steps: 3 Home Layout: One level Home Equipment: None Additional Comments: Previously in    Prior Function Level of Independence: Independent  Hand Dominance   Dominant Hand: Right    Extremity/Trunk Assessment   Upper Extremity Assessment Upper Extremity Assessment: Overall WFL for tasks assessed    Lower Extremity Assessment Lower Extremity Assessment: Overall WFL for tasks assessed       Communication      Cognition Arousal/Alertness: Awake/alert Behavior During Therapy: WFL for tasks assessed/performed Overall Cognitive Status: Within Functional Limits for tasks assessed                                         General Comments      Exercises General Exercises - Lower Extremity Ankle Circles/Pumps: AROM;Both;15 reps;Supine Quad Sets: AROM;Both;15 reps;Supine Gluteal Sets: Strengthening;Both;5 reps;Supine Short Arc Quad: AROM;Both;10 reps;Supine Heel Slides: AAROM;Both;10 reps;Supine Mini-Sqauts: Strengthening;Both;10 reps;Supine   Assessment/Plan    PT Assessment Patient needs continued PT services  PT Problem List Decreased activity tolerance;Decreased strength;Decreased range of motion;Decreased balance;Decreased mobility;Decreased coordination;Decreased cognition;Decreased safety awareness       PT Treatment Interventions DME instruction;Gait training;Stair training;Functional mobility training;Therapeutic activities;Therapeutic exercise;Patient/family education    PT Goals (Current goals can be found in the Care Plan section)  Acute Rehab PT Goals Patient Stated Goal: go home with DTR PT Goal Formulation: With patient Time For Goal Achievement: 05/06/20 Potential to Achieve Goals: Poor    Frequency Min 2X/week   Barriers to discharge Inaccessible home environment;Decreased caregiver support wants to eventually go home with DTR    Co-evaluation               AM-PAC PT "6 Clicks" Mobility  Outcome Measure Help needed turning from your back to your side while in a flat bed without using bedrails?: Total Help needed moving from lying on your back to sitting on the side of a flat bed without using bedrails?: Total Help needed moving to and from a bed to a chair (including a wheelchair)?: Total Help needed standing up from a chair using your arms (e.g., wheelchair or bedside chair)?: Total Help needed to walk in hospital room?: Total Help needed climbing 3-5 steps with a railing? : Total 6 Click Score: 6    End of Session Equipment Utilized During Treatment: Oxygen Activity Tolerance: Patient tolerated treatment  well;Patient limited by fatigue Patient left: in bed;with call bell/phone within reach Nurse Communication: Mobility status PT Visit Diagnosis: Unsteadiness on feet (R26.81);Other abnormalities of gait and mobility (R26.89);Muscle weakness (generalized) (M62.81);Difficulty in walking, not elsewhere classified (R26.2)    Time: 7035-0093 PT Time Calculation (min) (ACUTE ONLY): 24 min   Charges:   PT Evaluation $PT Eval High Complexity: 1 High PT Treatments $Therapeutic Exercise: 8-22 mins        4:40 PM, 04/22/20 Etta Grandchild, PT, DPT Physical Therapist - [facility]  [phone] ([place])    Enrigue Hashimi C 04/22/2020, 4:32 PM

## 2020-04-22 NOTE — Progress Notes (Signed)
Pt had uneventful night, VSS, pt went in A Fib with HR 150's during turns but didn't sustain and came out of it on her own. Slept well. Pt is very easily irritated, verbal abusive and ill with staff, she wake up this morning and yelled out and when staff entered her room she acted confused and said" did you see that black woman who came to my room and said me to kiss her ass?"  I said no she got mad and said " are you accusing me of lying". Attempt to reorient pt  But she got even more angry and said " I will report all black people here for being mean to me".  Pt was left alone at this point.

## 2020-04-22 NOTE — Progress Notes (Addendum)
Rehab Admissions Coordinator Note:  Patient was screened by Cleatrice Burke for appropriateness for an Inpatient Acute Rehab Consult per PT recommendation for possible CIR/inpt acute hospital rehab admit at [facility] for rehabilitation. Patient not yet able to attempt transfers, too weak to attempt/with femoral dialysis catheter per OT.. I will follow her progress to assist with planning dispo once her tolerance for more activity is demonstrated.  Cleatrice Burke RN MSN 04/22/2020, 5:04 PM  I can be reached at [phone].

## 2020-04-22 NOTE — Progress Notes (Addendum)
Patient had another episode of afib with HR 140's-160's, sustaining. Dr. Ubaldo Glassing notified and placed order for amio bolus. Given and HR now high 90's, low 100's. Patient is now back in NSR/ST. BP stable.  PT saw patient and was concerned about increased weakness in R arm and slightly more swollen. Message sent to Dr. Roger Shelter and report given to Donella Stade, RN (RN on PCU).  Also notified Dr. Holley Raring that ID was concerned about trialysis cath increasing wbc's, and wanted to know when it could be removed. PCU RN also added in this conversation.   Lupita Leash

## 2020-04-22 NOTE — Progress Notes (Signed)
[place] [facility] [place] NAME: Korene Dula    MR#:  [id number]  DATE OF BIRTH:  June 20, 1961  SUBJECTIVE:  CHIEF COMPLAINT:   Chief Complaint  Patient presents with  . Abdominal Pain  Patient sleeping, mouth breather.  No new issues. Daughter at [facility] course:   Per HPI:  hypertension, diverticulosis, HIV, GERD and depression, presented with acute onset of worsening left lower quadrant abdominal pain with N/V  And constipation.  The patient started having pain Friday 9 days ago when she was seen in the ER and given p.o. Augmentin .    Upon presentation BP: 151/98 with otherwise normal vital signs.  Labs revealed albumin of 2.8 with troponin of 6.4 and otherwise unremarkable CMP.  Urinalysis was unremarkable.  Abdominal and pelvic CT scan revealed the following: 1. Findings consistent with acute sigmoid colon diverticulitis. 7.5 x 4.4 cm gas and fluid collection within the anterior pelvis suspicious for contained perforation/abscess, this appears to communicate with the thickened inflamed sigmoid colon. 2. Small amount of upper abdominal and pelvic ascites. 3. Thickened appearance of pelvic small bowel loops, likely reactive  The patient was given 1 L bolus of IV lactated Ringer, 1 p.o. Norco, half milligram of IV Dilaudid, 4 mg of IV morphine sulfate and 4 mg of IV Zofran as well as 3.375 g of IV Zosyn.  Dr. Christian Mate was consulted about the patient and he will notify IR in a.m.     04/14/2020 patient hemodynamically decline was transferred to critical care team Surgery interventional radiology critical care team was consulted  04/15/2020 -patient remained under PCCM care lactic acid continues to worsen, now 7.2 (previously 6.1).  CT Abdomen/Pelvis obtained at 22:00 earlier in the shift, which was NEGATIVE   04/16/2020 -patient remains critically ill, improving hemodynamically, status post septic shock, multiorgan failure including  renal failure -nephrology was consulted patient will remain in ICU  04/17/2020-patient was seen, still critically ill, in respiratory distress audibly wheezing, with dialysis was initiated yesterday,-resuming again this morning ID following IV antibiotics surgery following still recommending no surgery -gallbladder drain in place  8/20 / 2021 -patient was seen, more awake, status post hemodialysis once again yesterday, still tachycardic, tachypneic, satting 97% on 3 L of oxygen   04/19/2020 -patient seems better she is responding to hemodialysis, still in A. fib with RVR cardiology attending to control rate with amiodarone, blood pressure remained soft -remains lethargic family present at bedside  04/21/2020 -patient was seen and examined, much more awake, cooperative, still on amiodarone drip, heart rate varies, urine culture positive for ESBL-continues on IV meropenem, on room air today   04/22/2020 -patient had to be placed back on 3 L of oxygen due to shortness of breath, hemodialysis on hold, IV amiodarone was switched to p.o. yesterday, back to A. fib with RVR this a.m. Mentation much improved back to baseline --------------------------------------------------------------------------------------------------------------------------------------------  Subjective: The patient was seen and examined this morning.  Mentation back to baseline, awake alert following commands. Continue to have shortness of breath, patient was placed back on 3 L of oxygen, satting Patient heart rate steadily elevated as she was participating in physical exam this morning.  Amiodarone was switched to p.o. yesterday.      ASSESSMENT AND PLAN:    Septic shock  -source diverticulosis/abscess -With end organ failure, respiratory failure, renal failure -Much improved, hemodynamically stable with exception of A. fib with RVR  -Initially patient  was on BiPAP in ICU which she continued to refuse-patient has been  weaned off high flow oxygen 6 L to 3 L then room air now patient had to be placed back on 3 L of oxygen overnight satting greater than 92%  -Status post IR placement of gallbladder drain -monitoring -General surgery following, recommending no surgical invention  -Acute renal failure due to septic shock, S/P hemodialysis on  8/18, 8/19 , 8/20, 8/23 Oligoanuric urine output 185 mL  Anticipating no hemodialysis today Nephrology following planning for no further dialysis today  -Hypotension improved, off Levophed -BP stabilized PCCM has signed off   -present on admission met the criteria for sepsis, did not develop septic shock -organ failure respiratory and kidney failure -needing hemodialysis -Source of infection is diverticular abscess status post CT-guided drainage.   Pelvic drainage catheter in stable position.  Surgery following.   Repeat CT scan shows on 8/16 no worsening.  -Consult PCCM, general surgery, IR, nephrology following closely  ID following Worsening leukocytosis 15.2, 22.6, >>> 24.8 >>22.6  >>       today Remained hemodynamically stable afebrile normotensive -was on Flagyl / Rocephin >>> has been switched to Meropenem >>   -Gallbladder/abscess  --fluid aerobic/anaerobic cultures drawn Klebsiella pneumonia, pseudomonas aeruginosa, E. Coli Klebsiella -resistant to ampicillin sulbactam    Pseudomonas vaginosis pansensitive E. Coli -ESBL only sensitive to imipenem and gentamicin    Lactic acidosis -Due to sepsis septic/septic shock, improved  Acute hypoxic respiratory failure secondary to COPD exacerbation -Much improved, satting 93% on 3 L O2 by nasal cannula Patient refused BiPAP, supplemental 6 L, 3 L, 2 L, was on room air for 48 hours, back on 3 L of oxygen PCCM was following -signed off -Now supplement O2 as needed -Tapering down steroids, continue DuoNeb    Acute kidney failure -due to sepsis shock-hypertension  - ATN Hemodialysis initiated on 8/18 -Acute  renal failure due to septic shock, S/P hemodialysis on  8/18, 8/19 , 8/20, 8/23 Oligoanuric urine output 185 mL  Anticipating no hemodialysis today  Foley catheter placed on 8/17 per surgery/PCCM Creatinine worsened from 0.8->1.28-> 1.78-> 3.23>>4.48 >> 4.51 >> 3.89 >> 3.89 >>4.61   -Appreciate nephrology following  IV fluids were discontinued, initiated Lasix  A. fib with RVR -Patient is flipping back and forth with A. fib with RVR -Remains on amiodarone drip >> will switch to p.o. -Appreciate cardiology following, appreciate input -Intolerable to Cardizem due to hypotension -Cardiology following   -Withholding anticoagulation at this time due to severe infection, poor drain in place Risk of bleeding outweighs the benefit    Acute liver failure -Due to sepsis/septic shock -Monitoring LFTs, >>> trending down-improving AST of 1634  >> 791  >> 94 >> 40 ALT 853 >>  1153 >> 419 >> 113 Alk phos 60, 84 >> 127 >> 113 >> 107  avoid hepatotoxic and nephrotoxic medications  Acute sigmoid diverticulitis and diverticular abscess s/p CT-guided drainage by IR on 8/16 -Drain in place -markedly reduced output, for a mild now IR consulted, status post CT-guided gallbladder drain -Surgery following closely recommending no surgical intervention -Continue IV antibiotics    Morbid obesity Complicates overall care Body mass index is 43.27 kg/m.    Metabolic encephalopathy -Much improved awake alert now -Likely cause sepsis, hypoxia, metabolic and respiratory acidosis -We will continue with neurochecks   Ethics:palliative care c/s for GOC  -partial code Prognosis still remain poor due to comorbidities, not progressively declining due to sepsis, septic shock with multiorgan failure.Marland Kitchen  Status is: Inpatient  Dispo: The patient is from: Home              Anticipated d/c is to: Home              Anticipated d/c date is: > 3 days              Patient currently is not medically  stable to d/c.    DVT prophylaxis:            enoxaparin (LOVENOX) injection 30 mg Start: 04/16/20 1000     Family Communication: discussed with Daughter   All the records are reviewed and case discussed with Care Management/Social Worker. Management plans discussed with the patient, family (Daughter at bedside) and they are in agreement.  CODE STATUS: Partial Code  TOTAL TIME TAKING CARE OF THIS PATIENT: 55 minutes.   More than 50% of the time was spent in counseling/coordination of care: YES  POSSIBLE D/C IN 3-4 DAYS, DEPENDING ON CLINICAL CONDITION.      DRUG ALLERGIES:   Allergies  Allergen Reactions  . Acetaminophen Nausea And Vomiting  . Ibuprofen Other (See Comments)    Reports causes bleeding  . Gabapentin Rash  . Morphine Itching and Rash  . Morphine And Related Itching  . Zanaflex  [Tizanidine] Rash   VITALS:  Blood pressure 110/73, pulse 90, temperature 98.2 F (36.8 C), temperature source Axillary, resp. rate 18, height $RemoveBe'5\' 6"'WRuZmyqrm$  (1.676 m), weight 121.6 kg, SpO2 97 %. PHYSICAL EXAMINATION:     Physical Exam:   General:  Alert, oriented, cooperative, shortness of breath  HEENT:  Normocephalic, PERRL, otherwise with in Normal limits   Neuro:  CNII-XII intact. , normal motor and sensation, reflexes intact   Lungs:   Clear to auscultation BL, Respirations unlabored, diffuse wheezing/rhonchi, but continues to improve.  Cardio:    S1/S2, RRR, No murmure, No Rubs or Gallops   Abdomen:   Soft, non-tender, bowel sounds active all four quadrants,  no guarding or peritoneal signs.  Muscular skeletal:  Limited exam - in bed, able to move all 4 extremities, Normal strength,  2+ pulses,  symmetric, No pitting edema  Skin:  Dry, warm to touch, negative for any Rashes, No open wounds  Wounds: Please see nursing documentation      JP drain present in the left lower quadrant draining serosanguineous drainage      Foley inserted on 8/17 LABORATORY PANEL:   Female CBC Recent Labs  Lab 04/19/20 0458  WBC 24.7*  HGB 11.5*  HCT 31.3*  PLT 36*   ------------------------------------------------------------------------------------------------------------------ Chemistries  Recent Labs  Lab 04/19/20 0848 04/19/20 0848 04/22/20 0436  NA 136  --   --   K 5.5*  --   --   CL 100  --   --   CO2 18*  --   --   GLUCOSE 143*  --   --   BUN 61*  --   --   CREATININE 3.89*  --   --   CALCIUM 8.2*  --   --   MG 1.9   < > 2.3  AST 94*  --   --   ALT 419*  --   --   ALKPHOS 113  --   --   BILITOT 1.2  --   --    < > = values in this interval not displayed.   RADIOLOGY:  DG Abd 1 View  Result Date: 04/14/2020 CLINICAL DATA:  Abdominal pain.  Pelvic abscess drainage today. EXAM: ABDOMEN - 1 VIEW COMPARISON:  CT abdomen pelvis from same day. FINDINGS: The bowel gas pattern is normal. No intraperitoneal free air peer no radio-opaque calculi or other significant radiographic abnormality are seen. No acute osseous abnormality. Prior lumbar fusion. IMPRESSION: 1. Negative. Electronically Signed   By: Titus Dubin M.D.   On: 04/14/2020 14:37   CT ABDOMEN PELVIS W CONTRAST  Result Date: 04/14/2020 CLINICAL DATA:  Peritonitis or perforation suspected Patient with history of perforated diverticulitis post percutaneous drain placement today. EXAM: CT ABDOMEN AND PELVIS WITH CONTRAST TECHNIQUE: Multidetector CT imaging of the abdomen and pelvis was performed using the standard protocol following bolus administration of intravenous contrast. CONTRAST:  45mL OMNIPAQUE IOHEXOL 300 MG/ML  SOLN COMPARISON:  Two prior abdominopelvic CT earlier this day, prior to drain placement.  IMPRESSION: 1. Interval percutaneous drainage of pelvic abscess. No residual fluid collection. 2. Small amount of non organized free fluid in the pelvis, pericolic gutters, and perihepatic right upper quadrant, slight increase from pre drainage CT earlier today. No evidence of new  abscess. No free air or interval perforation. 3. Heterogeneous enhancement of both kidneys, nonspecific but can be seen with pyelonephritis. 4. Small pleural effusions are new from prior exam. Adjacent compressive atelectasis. Electronically Signed   By: Keith Rake M.D.   On: 04/14/2020 22:22   DG Abd Portable 2V  Result Date: 04/15/2020 CLINICAL DATA:  Diverticulitis.  Abdominal pain and distention. EXAM: PORTABLE ABDOMEN - 2 VIEW COMPARISON:  CT 04/14/2020. FINDINGS: Pelvic drainage catheter again noted. No bowel distention. Stool noted throughout the colon. No free air. Bibasilar atelectasis. Degenerative change lumbar spine and both hips. Contrast in the bladder from prior CT. IMPRESSION: 1.  Pelvic drainage catheter in stable position. 2. No acute abdominal abnormality identified. No bowel distention or free air. 3.  Bibasilar atelectasis. Electronically Signed   By: Marcello Moores  Register   On: 04/15/2020 08:07      Deatra James M.D on 04/22/2020 at 11:48 AM  [facility]   CC: Primary care physician; Theotis Burrow, MD

## 2020-04-22 NOTE — Progress Notes (Signed)
Patient Name: Melanie Bowen Date of Encounter: 04/22/2020  Hospital Problem List     Principal Problem:   Severe sepsis with septic shock ([place]) Active Problems:   HTN (hypertension)   HIV (human immunodeficiency virus infection) ([place])   Diverticulitis of colon   Abscess of sigmoid colon due to diverticulitis   Acute renal failure (ARF) ([place])   Hypotension   Diverticulosis   Acute respiratory distress   DNR (do not resuscitate) discussion   Palliative care by specialist    Patient Profile        59 year old female with COPD, hypertension, diverticulosis, HIV, gastroesophageal reflux disease who presented with abdominal pain nausea and was noted to have a pelvic abscess. She had a CT-guided drainage and has a drain in place. She had atrial fibrillation rapid ventricular response which is a difficult control. Had acute renal insufficiency as well as elevated transaminases.  Subjective   Feels better this morning.    Denies chest pain or shortness of breath.  Inpatient Medications    . acidophilus  1 capsule Oral Daily  . amiodarone  400 mg Oral BID  . budesonide (PULMICORT) nebulizer solution  0.5 mg Nebulization BID  . Chlorhexidine Gluconate Cloth  6 each Topical Daily  . guaiFENesin-dextromethorphan  10 mL Oral Q8H  . ipratropium-albuterol  3 mL Nebulization Q4H  . methylPREDNISolone (SOLU-MEDROL) injection  40 mg Intravenous Q6H  . mometasone-formoterol  2 puff Inhalation BID  . pantoprazole (PROTONIX) IV  40 mg Intravenous Daily  . sodium chloride flush  5 mL Intracatheter Q8H  . sodium zirconium cyclosilicate  10 g Oral Daily    Vital Signs    Vitals:   04/22/20 0800 04/22/20 0844 04/22/20 1000 04/22/20 1100  BP: 127/76  115/67 110/73  Pulse: (!) 104  (!) 101 90  Resp: 20  14 18   Temp:  98.2 F (36.8 C)    TempSrc:  Axillary    SpO2: 97%  98% 97%  Weight:      Height:        Intake/Output Summary (Last 24 hours) at 04/22/2020 1127 Last data filed  at 04/22/2020 0600 Gross per 24 hour  Intake 983.04 ml  Output 893 ml  Net 90.04 ml   Filed Weights   04/13/20 2127 04/14/20 1822  Weight: 117.9 kg 121.6 kg    Physical Exam    GEN: Well nourished, well developed, in no acute distress.  HEENT: normal.  Neck: Supple, no JVD, carotid bruits, or masses. Cardiac: RRR, no murmurs, rubs, or gallops. No clubbing, cyanosis, edema.  Radials/DP/PT 2+ and equal bilaterally.  Respiratory:  Respirations regular and unlabored, clear to auscultation bilaterally. GI: Soft, nontender, nondistended, BS + x 4. MS: no deformity or atrophy. Skin: warm and dry, no rash. Neuro:  Strength and sensation are intact. Psych: Normal affect.  Labs    CBC No results for input(s): WBC, NEUTROABS, HGB, HCT, MCV, PLT in the last 72 hours. Basic Metabolic Panel Recent Labs    04/22/20 0436  MG 2.3  PHOS 8.5*   Liver Function Tests No results for input(s): AST, ALT, ALKPHOS, BILITOT, PROT, ALBUMIN in the last 72 hours. No results for input(s): LIPASE, AMYLASE in the last 72 hours. Cardiac Enzymes No results for input(s): CKTOTAL, CKMB, CKMBINDEX, TROPONINI in the last 72 hours. BNP No results for input(s): BNP in the last 72 hours. D-Dimer No results for input(s): DDIMER in the last 72 hours. Hemoglobin A1C No results for input(s): HGBA1C in  the last 72 hours. Fasting Lipid Panel No results for input(s): CHOL, HDL, LDLCALC, TRIG, CHOLHDL, LDLDIRECT in the last 72 hours. Thyroid Function Tests No results for input(s): TSH, T4TOTAL, T3FREE, THYROIDAB in the last 72 hours.  Invalid input(s): FREET3  Telemetry    Currently in sinus rhythm.  ECG       Radiology    CT ABDOMEN PELVIS WO CONTRAST  Result Date: 04/18/2020 CLINICAL DATA:  59 year old female with concern for abdominal abscess or infection. EXAM: CT ABDOMEN AND PELVIS WITHOUT CONTRAST TECHNIQUE: Multidetector CT imaging of the abdomen and pelvis was performed following the standard  protocol without IV contrast. COMPARISON:  CT abdomen pelvis dated 04/14/2020. FINDINGS: Evaluation of this exam is limited due to respiratory motion artifact. Lower chest: Partially visualized clusters of ground-glass opacity primarily involving the right lung base may represent areas of atelectasis but concerning for pneumonia. Clinical correlation is recommended. No intra-abdominal free air. There is a small free fluid within the pelvis. Hepatobiliary: The liver is unremarkable. No intrahepatic biliary ductal dilatation. High attenuating content within the gallbladder, likely vicarious excretion of contrast. Pancreas: Unremarkable. No pancreatic ductal dilatation or surrounding inflammatory changes. Spleen: Normal in size without focal abnormality. Adrenals/Urinary Tract: The adrenal glands unremarkable. Retained contrast from prior CT within the renal parenchyma consistent with delayed clearance and acute renal insufficiency. There is striated renal parenchyma concerning for pyelonephritis. Correlation with urinalysis recommended. There is no hydronephrosis. The visualized ureters appear unremarkable. The urinary bladder is decompressed around a Foley catheter. Stomach/Bowel: There is distal colonic and sigmoid diverticulosis without active inflammatory changes. Several mildly dilated small bowel loops measuring up to 3.4 cm in caliber without a transition most likely an ileus. Oral contrast traverses into the cecum. The appendix is normal. Vascular/Lymphatic: Advanced aortoiliac atherosclerotic disease. The IVC is unremarkable. No portal venous gas. There is no adenopathy. Reproductive: The uterus is grossly unremarkable. Other: Diffuse edema of the pelvic floor with small free fluid. Overall decrease in the inflammatory changes and stranding compared to the prior CT. A pigtail drainage catheter is noted within the pelvis in similar position. No drainable fluid collection identified. Musculoskeletal: There is  osteopenia with degenerative changes of the spine. L3-L5 disc spacer and posterior fusion. There is disc desiccation and vacuum phenomena at L5-S1. No acute osseous pathology. IMPRESSION: 1. Partially visualized right lung base atelectasis or pneumonia. Clinical correlation is recommended. 2. Retained contrast within the renal parenchyma concerning for acute renal insufficiency. There is heterogeneous and striated nephrogram which may represent pyelonephritis. Correlation with urinalysis recommended. 3. Mild ileus.  No definite obstruction. 4. Colonic diverticulosis. 5. Overall decrease in the inflammatory changes and stranding compared to the prior CT. A pigtail drainage catheter within the pelvis is similar position. No drainable fluid collection identified. 6. Aortic Atherosclerosis (ICD10-I70.0). Electronically Signed   By: Anner Crete M.D.   On: 04/18/2020 17:59   CT ABDOMEN PELVIS WO CONTRAST  Result Date: 04/14/2020 CLINICAL DATA:  Abdominal pain. Contained perforated acute diverticulitis. EXAM: CT ABDOMEN AND PELVIS WITHOUT CONTRAST TECHNIQUE: Multidetector CT imaging of the abdomen and pelvis was performed following the standard protocol without IV contrast. COMPARISON:  CT 04/14/2020, 01/31/2019 FINDINGS: Lower chest: Lung bases are clear. Hepatobiliary: No focal hepatic lesion. No biliary duct dilatation. Common bile duct is normal. Pancreas: Pancreas is normal. No ductal dilatation. No pancreatic inflammation. Spleen: Normal spleen Adrenals/urinary tract: Adrenal glands and kidneys are normal. The ureters and bladder normal. Stomach/Bowel: Stomach duodenum normal. Mildly thickened loops small bowel in  the pelvis is favored secondary inflammation related to pelvic abscess. Appendix is partially imaged (image 61/2) appears normal. The ascending and transverse colon normal. There are diverticula descending colon and sigmoid colon. Diverticular disease is heavy in the proximal sigmoid colon. There  is a thick-walled fluid collection with the an air-fluid level measuring 8.1 x 4.7 cm in the central anterior upper pelvis. This is adjacent to the sigmoid colon. There is a thin tract between the colon in the fluid collection on image 71/2. Similar pericolonic abscess seen on CT 01/17/2019. Findings most consistent with contained sigmoid perforation with abscess formation. No intraperitoneal free.  Minimal fluid in the pelvis. Vascular/Lymphatic: Abdominal aorta is normal caliber with atherosclerotic calcification. There is no retroperitoneal or periportal lymphadenopathy. No pelvic lymphadenopathy. Reproductive: Post hysterectomy.  Adnexa unremarkable Other: Small volume free fluid the pelvis Musculoskeletal: Posterior lumbar fusion IMPRESSION: 1. No significant interval change in short interval follow-up. 2. Contained sigmoid colon perforation with abscess formation. Perforation presumed related to diverticulitis. Findings similar to May 2020 with increased volume of contained perforation. 3. No intraperitoneal free air. 4. Secondary inflammation of the small bowel. Electronically Signed   By: Suzy Bouchard M.D.   On: 04/14/2020 08:08   DG Chest 1 View  Result Date: 04/14/2020 CLINICAL DATA:  Dyspnea EXAM: CHEST  1 VIEW COMPARISON:  11/06/2019 FINDINGS: Mildly low lung volumes. There is no edema, consolidation, effusion, or pneumothorax. Normal heart size and mediastinal contours. Artifact from EKG leads IMPRESSION: No evidence of acute disease. Electronically Signed   By: Monte Fantasia M.D.   On: 04/14/2020 07:17   DG Abd 1 View  Result Date: 04/14/2020 CLINICAL DATA:  Abdominal pain.  Pelvic abscess drainage today. EXAM: ABDOMEN - 1 VIEW COMPARISON:  CT abdomen pelvis from same day. FINDINGS: The bowel gas pattern is normal. No intraperitoneal free air peer no radio-opaque calculi or other significant radiographic abnormality are seen. No acute osseous abnormality. Prior lumbar fusion. IMPRESSION:  1. Negative. Electronically Signed   By: Titus Dubin M.D.   On: 04/14/2020 14:37   CT ABDOMEN PELVIS W CONTRAST  Result Date: 04/14/2020 CLINICAL DATA:  Peritonitis or perforation suspected Patient with history of perforated diverticulitis post percutaneous drain placement today. EXAM: CT ABDOMEN AND PELVIS WITH CONTRAST TECHNIQUE: Multidetector CT imaging of the abdomen and pelvis was performed using the standard protocol following bolus administration of intravenous contrast. CONTRAST:  46mL OMNIPAQUE IOHEXOL 300 MG/ML  SOLN COMPARISON:  Two prior abdominopelvic CT earlier this day, prior to drain placement. FINDINGS: Lower chest: Small pleural effusions are new from prior exam. Adjacent compressive atelectasis. Breathing motion artifact in the lung bases limits assessment. Hepatobiliary: Small amount of perihepatic fluid slightly increased from prior exam. This appears mildly complex. High-density contents in the urinary bladder increasing likely vicarious excretion of IV contrast. There is no discrete focal hepatic abnormality allowing for motion. Pancreas: Motion artifact through the pancreas. No obvious inflammation. Spleen: Normal allowing for motion. Adrenals/Urinary Tract: No adrenal nodule. There is heterogeneous enhancement of both kidneys. No significant perinephric edema. Minimal excretion from both kidneys on delayed phase imaging. There is excreted IV contrast within the urinary bladder. Stomach/Bowel: New surgical drain within the pelvic fluid collection. No definite residual fluid. A drain is closely approximated to fecalized loops of small bowel in the pelvis. Motion artifact limits detailed bowel assessment. Similar appearance of scattered small bowel thickening from prior exam which is likely reactive. There is no evidence of obstruction. No bowel pneumatosis. Diverticulosis involving the  colon. No new colonic inflammation or diverticulitis. Air-fluid level in the stomach with small  amount of fluid in the distal esophagus. Vascular/Lymphatic: Aortic atherosclerosis. No aortic aneurysm. No evidence of portal venous or mesenteric gas. No bulky adenopathy. Reproductive: Uterus and bilateral adnexa are unremarkable. Other: Surgical drain within the pelvic fluid collection which appears completely decompressed. No definite residual collection. Generalized fat stranding involving the lower small bowel mesentery. Small amount of non organized free fluid in the dependent pelvis, tracking into the pericolic gutters and right upper quadrant. There is no definite free air. Musculoskeletal: Stable. Postsurgical change in the lower lumbar spine. IMPRESSION: 1. Interval percutaneous drainage of pelvic abscess. No residual fluid collection. 2. Small amount of non organized free fluid in the pelvis, pericolic gutters, and perihepatic right upper quadrant, slight increase from pre drainage CT earlier today. No evidence of new abscess. No free air or interval perforation. 3. Heterogeneous enhancement of both kidneys, nonspecific but can be seen with pyelonephritis. 4. Small pleural effusions are new from prior exam. Adjacent compressive atelectasis. Electronically Signed   By: Keith Rake M.D.   On: 04/14/2020 22:22   CT Abdomen Pelvis W Contrast  Result Date: 04/14/2020 CLINICAL DATA:  Lower abdominal pain EXAM: CT ABDOMEN AND PELVIS WITH CONTRAST TECHNIQUE: Multidetector CT imaging of the abdomen and pelvis was performed using the standard protocol following bolus administration of intravenous contrast. CONTRAST:  117mL OMNIPAQUE IOHEXOL 300 MG/ML  SOLN COMPARISON:  01/31/2019 FINDINGS: Lower chest: Lung bases demonstrate no acute consolidation or pleural effusion. Normal cardiac size. Hepatobiliary: No focal liver abnormality is seen. No gallstones, gallbladder wall thickening, or biliary dilatation. Pancreas: Unremarkable. No pancreatic ductal dilatation or surrounding inflammatory changes. Spleen:  Normal in size without focal abnormality. Adrenals/Urinary Tract: Adrenal glands are unremarkable. Kidneys are normal, without renal calculi, focal lesion, or hydronephrosis. Bladder is unremarkable. Stomach/Bowel: The stomach is nonenlarged. No dilated small bowel. Mildly thickened loops of small bowel within the pelvis, likely reactive. Negative appendix. Sigmoid colon diverticular disease. Wall thickening and mild inflammatory change at the distal sigmoid colon consistent with diverticulitis. 7.5 x 4.4 cm gas and fluid collection within the anterior pelvis suspicious for contained perforation/abscess. Soft tissue inflammatory density extends from inflamed sigmoid colon to the gas and fluid collection, sagittal series 6, image number 96. Vascular/Lymphatic: Moderate aortic atherosclerosis. No aneurysm. No suspicious nodes. Reproductive: Uterus and bilateral adnexa are unremarkable. Other: No free air. Small free fluid in the pelvis. Small amount of upper abdominal ascites. Generalized soft tissue stranding within the pelvis consistent with inflammatory process. Musculoskeletal: Postsurgical changes of the lumbar spine with surgical rod and fixating screws L3 through L5. No acute osseous abnormality IMPRESSION: 1. Findings consistent with acute sigmoid colon diverticulitis. 7.5 x 4.4 cm gas and fluid collection within the anterior pelvis suspicious for contained perforation/abscess, this appears to communicate with the thickened inflamed sigmoid colon. 2. Small amount of upper abdominal and pelvic ascites. 3. Thickened appearance of pelvic small bowel loops, likely reactive Aortic Atherosclerosis (ICD10-I70.0). Electronically Signed   By: Donavan Foil M.D.   On: 04/14/2020 01:27   US RENAL  Result Date: 04/16/2020 CLINICAL DATA:  Acute renal failure. EXAM: RENAL / URINARY TRACT ULTRASOUND COMPLETE COMPARISON:  CT abdomen and pelvis 04/14/2020 FINDINGS: Right Kidney: Renal measurements: 11.5 x 5.3 x 4.9 cm =  volume: 158 mL. Echogenicity within normal limits. No mass or hydronephrosis visualized. Left Kidney: Renal measurements: 11.3 x 5.5 x 6.1 cm = volume: 197 mL. Echogenicity within normal limits.  No mass or hydronephrosis visualized. Bladder: Decompressed by Foley catheter. Other: None. IMPRESSION: Negative renal ultrasound. Electronically Signed   By: Logan Bores M.D.   On: 04/16/2020 13:01   MR SHOULDER RIGHT WO CONTRAST  Result Date: 04/01/2020 CLINICAL DATA:  Right shoulder pain and limited range of motion. No specific injury. EXAM: MRI OF THE RIGHT SHOULDER WITHOUT CONTRAST TECHNIQUE: Multiplanar, multisequence MR imaging of the shoulder was performed. No intravenous contrast was administered. COMPARISON:  None. FINDINGS: Examination limited by patient motion. Rotator cuff: Full-thickness retracted tear of the supraspinatus tendon is noted. This involves the mid and posterior fibers. The tear is in the critical zone region. Maximum retraction is estimated at 22 mm and the tear is 15.5 mm wide. The anterior fibers are still intact and the subscapularis tendon is intact. The infraspinatus tendon is intact. Tendinopathy noted with interstitial tears. Muscles:  No significant findings. Biceps long head:  Intact. Acromioclavicular Joint: Moderate degenerative changes. Type 2 acromion. No significant lateral downsloping or undersurface spurring. Glenohumeral Joint: Moderate to advanced degenerative changes with areas of full or near full-thickness cartilage loss, joint space narrowing, spurring and subchondral cystic change. Labrum:  Labral degenerative changes without obvious tear. Bones: No acute bony findings. There appears to be a small defect in the glenoid with fluid tracking back into the subcoracoid space. Possible old fracture. Other: Expected fluid in the subacromial/subdeltoid bursa. IMPRESSION: 1. Full-thickness retracted tear of the supraspinatus tendon as described above. 2. Intact long head biceps  tendon and grossly intact glenoid labrum. 3. Advanced glenohumeral joint degenerative changes. 4. Moderate AC joint degenerative changes but no other significant findings for bony impingement. Electronically Signed   By: Marijo Sanes M.D.   On: 04/01/2020 20:48   DG Abd Portable 2V  Result Date: 04/15/2020 CLINICAL DATA:  Diverticulitis.  Abdominal pain and distention. EXAM: PORTABLE ABDOMEN - 2 VIEW COMPARISON:  CT 04/14/2020. FINDINGS: Pelvic drainage catheter again noted. No bowel distention. Stool noted throughout the colon. No free air. Bibasilar atelectasis. Degenerative change lumbar spine and both hips. Contrast in the bladder from prior CT. IMPRESSION: 1.  Pelvic drainage catheter in stable position. 2. No acute abdominal abnormality identified. No bowel distention or free air. 3.  Bibasilar atelectasis. Electronically Signed   By: Marcello Moores  Register   On: 04/15/2020 08:07   CT IMAGE GUIDED DRAINAGE PERCUT CATH  PERITONEAL RETROPERIT  Result Date: 04/14/2020 INDICATION: 59 year old with air-fluid collection in the lower abdomen / upper pelvic region. Findings are suggestive for a colonic diverticular abscess. Patient needs CT-guided drainage. EXAM: CT-GUIDED DRAINAGE OF PELVIC ABSCESS MEDICATIONS: Fentanyl 25 mcg ANESTHESIA/SEDATION: The patient was continuously monitored during the procedure by the interventional radiology nurse under my direct supervision. COMPLICATIONS: None immediate. PROCEDURE: Informed written consent was obtained from the patient after a thorough discussion of the procedural risks, benefits and alternatives. All questions were addressed. A timeout was performed prior to the initiation of the procedure. Patient was placed supine on the CT scanner. Images through the lower abdomen and pelvis were performed. Air-fluid collection in the lower abdomen/pelvic region was identified and targeted for drainage. The anterior abdomen was prepped with chlorhexidine and sterile field was  created. Skin and soft tissues were anesthetized using 1% lidocaine. A small incision was made. Using CT guidance, a 18 gauge trocar needle was directed into the air-fluid collection. Gray purulent fluid was coming out of the needle. Super stiff Amplatz wire was advanced into the collection and follow up CT images were obtained. The  drain was dilated to accommodate a 12 [place] multipurpose drain. 12 French drain was placed. 40 mL gray colored purulent fluid and gas was removed from the collection. Follow up CT images were obtained. Catheter was sutured to skin and attached to a suction bulb. FINDINGS: Air-fluid collection just cephalad to the urinary bladder. Drain was successfully placed within the collection and 40 mL of purulent fluid was removed. The abscess was decompressed at the end of the procedure. IMPRESSION: CT-guided placement of a drainage catheter within the lower abdomen/pelvic abscess. Electronically Signed   By: Markus Daft M.D.   On: 04/14/2020 11:48    Assessment & Plan    59 yo female with history of copd, hypertension HIV , admitted with sepsis and has developed elevated liver transaminases,acute renal failure requiring hd and afib with rvr.  Afib-remains predominantly in sinus rhythm on amiodarone orally.  Had an episode of A. fib with RVR yesterday converted back to sinus rhythm with a bolus of IV amiodarone.   The episodes of A. fib appear to be improving. We will continue with IV amiodarone for now. Not a candidate for anticoagulation.  P.o. amiodarone appears to be controlling the rhythm predominantly.  We will continue with 40 mg twice daily for the rest of this week and then convert back to 200 twice daily.  Renal failure-on HD. Renal insufficiency likely secondary to contrast. Creatinine was 4.6 yesterday.  This morning's value is pending.  Sepsis-. Continues on broad-spectrum antibiotics.  Clinically is improved.   Liver failure-elevated transaminases have  improved.  Respiratory failure-clinically improved.  Doing well.  Pelvic abscess-being followed by general surgery.  Signed, Javier Docker Regina Coppolino MD 04/22/2020, 11:27 AM  Pager: [phone]

## 2020-04-23 DIAGNOSIS — K5720 Diverticulitis of large intestine with perforation and abscess without bleeding: Secondary | ICD-10-CM | POA: Diagnosis not present

## 2020-04-23 DIAGNOSIS — R0600 Dyspnea, unspecified: Secondary | ICD-10-CM

## 2020-04-23 DIAGNOSIS — I959 Hypotension, unspecified: Secondary | ICD-10-CM

## 2020-04-23 DIAGNOSIS — R6521 Severe sepsis with septic shock: Secondary | ICD-10-CM | POA: Diagnosis not present

## 2020-04-23 DIAGNOSIS — A419 Sepsis, unspecified organism: Secondary | ICD-10-CM | POA: Diagnosis not present

## 2020-04-23 DIAGNOSIS — K810 Acute cholecystitis: Secondary | ICD-10-CM

## 2020-04-23 DIAGNOSIS — N179 Acute kidney failure, unspecified: Secondary | ICD-10-CM | POA: Diagnosis not present

## 2020-04-23 DIAGNOSIS — D696 Thrombocytopenia, unspecified: Secondary | ICD-10-CM

## 2020-04-23 DIAGNOSIS — D72829 Elevated white blood cell count, unspecified: Secondary | ICD-10-CM

## 2020-04-23 LAB — MAGNESIUM: Magnesium: 2.4 mg/dL (ref 1.7–2.4)

## 2020-04-23 LAB — COMPREHENSIVE METABOLIC PANEL
ALT: 83 U/L — ABNORMAL HIGH (ref 0–44)
AST: 32 U/L (ref 15–41)
Albumin: 2.1 g/dL — ABNORMAL LOW (ref 3.5–5.0)
Alkaline Phosphatase: 106 U/L (ref 38–126)
Anion gap: 19 — ABNORMAL HIGH (ref 5–15)
BUN: 108 mg/dL — ABNORMAL HIGH (ref 6–20)
CO2: 20 mmol/L — ABNORMAL LOW (ref 22–32)
Calcium: 8.4 mg/dL — ABNORMAL LOW (ref 8.9–10.3)
Chloride: 95 mmol/L — ABNORMAL LOW (ref 98–111)
Creatinine, Ser: 5 mg/dL — ABNORMAL HIGH (ref 0.44–1.00)
GFR calc Af Amer: 10 mL/min — ABNORMAL LOW (ref >60)
GFR calc non Af Amer: 9 mL/min — ABNORMAL LOW (ref >60)
Glucose, Bld: 186 mg/dL — ABNORMAL HIGH (ref 70–99)
Potassium: 5.2 mmol/L — ABNORMAL HIGH (ref 3.5–5.1)
Sodium: 134 mmol/L — ABNORMAL LOW (ref 135–145)
Total Bilirubin: 1.2 mg/dL (ref 0.3–1.2)
Total Protein: 5.3 g/dL — ABNORMAL LOW (ref 6.5–8.1)

## 2020-04-23 LAB — CBC
HCT: 31.1 % — ABNORMAL LOW (ref 36.0–46.0)
Hemoglobin: 11.1 g/dL — ABNORMAL LOW (ref 12.0–15.0)
MCH: 30.7 pg (ref 26.0–34.0)
MCHC: 35.7 g/dL (ref 30.0–36.0)
MCV: 85.9 fL (ref 80.0–100.0)
Platelets: 194 10*3/uL (ref 150–400)
RBC: 3.62 MIL/uL — ABNORMAL LOW (ref 3.87–5.11)
RDW: 19.7 % — ABNORMAL HIGH (ref 11.5–15.5)
WBC: 40.1 10*3/uL — ABNORMAL HIGH (ref 4.0–10.5)
nRBC: 0.2 % (ref 0.0–0.2)

## 2020-04-23 LAB — PHOSPHORUS: Phosphorus: 10 mg/dL — ABNORMAL HIGH (ref 2.5–4.6)

## 2020-04-23 MED ORDER — HEPARIN SODIUM (PORCINE) 5000 UNIT/ML IJ SOLN
5000.0000 [IU] | Freq: Three times a day (TID) | INTRAMUSCULAR | Status: AC
  Administered 2020-04-23 – 2020-05-14 (×53): 5000 [IU] via SUBCUTANEOUS
  Filled 2020-04-23 (×53): qty 1

## 2020-04-23 MED ORDER — ALTEPLASE 2 MG IJ SOLR: 2.0000 mg | Freq: Once | INTRAMUSCULAR | Status: DC

## 2020-04-23 MED ORDER — BISACODYL 5 MG PO TBEC
10.0000 mg | DELAYED_RELEASE_TABLET | Freq: Every day | ORAL | Status: DC | PRN
  Administered 2020-05-01 – 2020-05-03 (×2): 10 mg via ORAL
  Filled 2020-04-23 (×2): qty 2

## 2020-04-23 MED ORDER — DOCUSATE SODIUM 100 MG PO CAPS
200.0000 mg | ORAL_CAPSULE | Freq: Two times a day (BID) | ORAL | Status: DC
  Administered 2020-04-23 – 2020-05-12 (×26): 200 mg via ORAL
  Filled 2020-04-23 (×34): qty 2

## 2020-04-23 MED ORDER — PREGABALIN 25 MG PO CAPS
25.0000 mg | ORAL_CAPSULE | Freq: Every day | ORAL | Status: DC
  Administered 2020-04-24 – 2020-05-07 (×14): 25 mg via ORAL
  Filled 2020-04-23 (×15): qty 1

## 2020-04-23 MED ORDER — SODIUM CHLORIDE 0.9 % IV SOLN
500.0000 mg | INTRAVENOUS | Status: DC
  Administered 2020-04-23 – 2020-04-24 (×2): 500 mg via INTRAVENOUS
  Filled 2020-04-23 (×3): qty 0.5

## 2020-04-23 NOTE — Progress Notes (Signed)
PROGRESS NOTE    Melanie Bowen  ATF:[id number] DOB: 03/18/1961 DOA: 04/14/2020 PCP: Theotis Burrow, MD   Assessment & Plan:   Principal Problem:   Severe sepsis with septic shock ([place]) Active Problems:   HTN (hypertension)   HIV (human immunodeficiency virus infection) ([place])   Diverticulitis of colon   Abscess of sigmoid colon due to diverticulitis   Acute renal failure (ARF) ([place])   Hypotension   Diverticulosis   Acute respiratory distress   DNR (do not resuscitate) discussion   Palliative care by specialist   Septic shock: w/ end organ failure. Secondary to diverticulosis/abscess. Continue on IV merrem. Initially patient was on BiPAP in ICU which she continued to refuse & then placed on high flow and now currently on 3L [place]. S/p IR placement of gallbladder drain. No surgical intervention currently as per gen surg.   Acute renal failure: secondary to septic shock. Continue on HD as per nephro. Nephro recs apprec   Gallbladder abscess: fluid aerobic/anaerobic cultures shows klebsiella pneumonia, pseudomonas aeruginosa, e. coli. Continue on IV merrem as per ID   Acute sigmoid diverticulitis and diverticular abscess: s/p CT-guided drainage by IR on 8/16. Continue on IV abxs as per ID    Hypotension: resolved   Leukocytosis: significant. Continue on IV merrem as per ID. Possibly secondary to steroid use   Lactic acidosis: resolved   Acute hypoxic respiratory failure: secondary to COPD exacerbation. Continue on supplemental oxygen and wean as tolerated. Continue on steroid taper. Continue on bronchodilators. Encourage incentive spirometry   A. Fib: w/ RVR. Continue on amiodarone. Unable to tolerate cardizem due to hypotension. Withholding anticoagulation at this time due to severe infection. Cardio recs apprec   Acute liver failure: secondary to septic shock. Will continue to monitor   Morbid obesity: BMI 46.6. Complicates overall care and prognosis     Metabolic encephalopathy: likely secondary to septic shock, hypoxia. Will continue to monitor. Likely close to baseline     DVT prophylaxis: heparin Code Status: No intubation Family Communication:  Disposition Plan: likely d/c to CIR vs SNF   Consultants:   Surgery  ID   Procedures:    Antimicrobials: merrem    Subjective: Pt c/o b/l foot pain   Objective: Vitals:   04/22/20 2133 04/23/20 0151 04/23/20 0502 04/23/20 0757  BP:   132/67 (!) 113/56  Pulse:   97 99  Resp:    19  Temp:   98 F (36.7 C) 98.1 F (36.7 C)  TempSrc:   Oral Oral  SpO2: 96% 98% 95% 98%  Weight:      Height:        Intake/Output Summary (Last 24 hours) at 04/23/2020 0800 Last data filed at 04/23/2020 2706 Gross per 24 hour  Intake 192.48 ml  Output 285 ml  Net -92.52 ml   Filed Weights   04/13/20 2127 04/14/20 1822 04/22/20 1649  Weight: 117.9 kg 121.6 kg 131 kg    Examination:  General exam: Appears calm and comfortable  Respiratory system: diminished breath sounds b/l  Cardiovascular system: S1 & S2+. No rubs, gallops or clicks.  Gastrointestinal system: Abdomen is nondistended, soft and nontender. Hypoactive  bowel sounds heard. Central nervous system: Alert and oriented. Moves all 4 extremities Psychiatry: Judgement and insight appear normal. Mood & affect appropriate.     Data Reviewed: I have personally reviewed following labs and imaging studies  CBC: Recent Labs  Lab 04/17/20 0603 04/18/20 0442 04/19/20 0458 04/22/20 1112 04/23/20 0545  WBC  22.6* 24.8* 24.7* 35.1* 40.1*  NEUTROABS  --   --   --  32.0*  --   HGB 11.5* 12.0 11.5* 11.4* 11.1*  HCT 31.5* 32.4* 31.3* 32.9* 31.1*  MCV 84.7 83.5 82.2 87.7 85.9  PLT 49* 31* 36* 130* 937   Basic Metabolic Panel: Recent Labs  Lab 04/16/20 0900 04/17/20 0603 04/18/20 0442 04/19/20 0848 04/22/20 0436 04/22/20 1112  NA 135 136 136 136  --  135  K 4.9 4.9 4.8 5.5*  --  5.0  CL 101 102 101 100  --  96*  CO2  14* 17* 20* 18*  --  19*  GLUCOSE 172* 110* 126* 143*  --  198*  BUN 66* 63* 63* 61*  --  95*  CREATININE 4.65* 4.51* 4.21* 3.89*  --  4.61*  CALCIUM 6.7* 6.9* 7.3* 8.2*  --  7.9*  MG  --  1.7 1.9 1.9 2.3  --   PHOS  --  5.3* 5.9* 6.2* 8.5*  --    GFR: Estimated Creatinine Clearance: 18.5 mL/min (A) (by C-G formula based on SCr of 4.61 mg/dL (H)). Liver Function Tests: Recent Labs  Lab 04/17/20 0603 04/18/20 0442 04/19/20 0848 04/22/20 1112  AST 791* 247* 94* 40  ALT 1,153* 746* 419* 113*  ALKPHOS 127* 120 113 107  BILITOT 0.9 0.7 1.2 0.7  PROT 5.5* 5.5* 5.2* 5.3*  ALBUMIN 2.1* 2.0* 1.9* 2.1*   No results for input(s): LIPASE, AMYLASE in the last 168 hours. No results for input(s): AMMONIA in the last 168 hours. Coagulation Profile: No results for input(s): INR, PROTIME in the last 168 hours. Cardiac Enzymes: No results for input(s): CKTOTAL, CKMB, CKMBINDEX, TROPONINI in the last 168 hours. BNP (last 3 results) No results for input(s): PROBNP in the last 8760 hours. HbA1C: No results for input(s): HGBA1C in the last 72 hours. CBG: No results for input(s): GLUCAP in the last 168 hours. Lipid Profile: No results for input(s): CHOL, HDL, LDLCALC, TRIG, CHOLHDL, LDLDIRECT in the last 72 hours. Thyroid Function Tests: No results for input(s): TSH, T4TOTAL, FREET4, T3FREE, THYROIDAB in the last 72 hours. Anemia Panel: No results for input(s): VITAMINB12, FOLATE, FERRITIN, TIBC, IRON, RETICCTPCT in the last 72 hours. Sepsis Labs: Recent Labs  Lab 04/16/20 1607  LATICACIDVEN 1.6    Recent Results (from the past 240 hour(s))  SARS Coronavirus 2 by RT PCR (hospital order, performed in [facility] hospital lab) Nasopharyngeal Nasopharyngeal Swab     Status: None   Collection Time: 04/14/20  2:17 AM   Specimen: Nasopharyngeal Swab  Result Value Ref Range Status   SARS Coronavirus 2 NEGATIVE NEGATIVE Final    Comment: (NOTE) SARS-CoV-2 target nucleic acids are NOT  DETECTED.  The SARS-CoV-2 RNA is generally detectable in upper and lower respiratory specimens during the acute phase of infection. The lowest concentration of SARS-CoV-2 viral copies this assay can detect is 250 copies / mL. A negative result does not preclude SARS-CoV-2 infection and should not be used as the sole basis for treatment or other patient management decisions.  A negative result may occur with improper specimen collection / handling, submission of specimen other than nasopharyngeal swab, presence of viral mutation(s) within the areas targeted by this assay, and inadequate number of viral copies (<250 copies / mL). A negative result must be combined with clinical observations, patient history, and epidemiological information.  Fact Sheet for Patients:   StrictlyIdeas.no  Fact Sheet for Healthcare Providers: BankingDealers.co.za  This test is not  yet approved or  cleared by the [place] and has been authorized for detection and/or diagnosis of SARS-CoV-2 by FDA under an Emergency Use Authorization (EUA).  This EUA will remain in effect (meaning this test can be used) for the duration of the COVID-19 declaration under Section 564(b)(1) of the Act, 21 U.S.C. section 360bbb-3(b)(1), unless the authorization is terminated or revoked sooner.  Performed at [facility], [address] [place], [place] [address]   CULTURE, BLOOD (ROUTINE X 2) w Reflex to ID Panel     Status: None   Collection Time: 04/14/20  8:14 AM   Specimen: BLOOD  Result Value Ref Range Status   Specimen Description BLOOD LEFT [facility]  Final   Special Requests   Final    BOTTLES DRAWN AEROBIC AND ANAEROBIC Blood Culture adequate volume   Culture   Final    NO GROWTH 5 DAYS Performed at [facility], [address] [place], [place] [address]    Report Status 04/19/2020 FINAL  Final  CULTURE, BLOOD (ROUTINE X 2) w Reflex to ID  Panel     Status: None   Collection Time: 04/14/20  8:14 AM   Specimen: BLOOD  Result Value Ref Range Status   Specimen Description BLOOD LEFT WRIST  Final   Special Requests   Final    BOTTLES DRAWN AEROBIC AND ANAEROBIC Blood Culture adequate volume   Culture   Final    NO GROWTH 5 DAYS Performed at [facility], [address] [place], [place] [address]    Report Status 04/19/2020 FINAL  Final  Aerobic/Anaerobic Culture (surgical/deep wound)     Status: None   Collection Time: 04/14/20 11:14 AM   Specimen: Abscess  Result Value Ref Range Status   Specimen Description   Final    ABSCESS Performed at [facility], [address] [place], [place] [address]    Special Requests   Final    NONE Performed at [facility], [place], [place] [address]    Gram Stain   Final    NO WBC SEEN ABUNDANT GRAM NEGATIVE RODS FEW GRAM POSITIVE COCCI FEW GRAM POSITIVE RODS Performed at [facility], [place] [address], [place], [place] [address]    Culture   Final    ABUNDANT KLEBSIELLA PNEUMONIAE ABUNDANT PSEUDOMONAS AERUGINOSA MODERATE ESCHERICHIA COLI Confirmed Extended Spectrum Beta-Lactamase Producer (ESBL).  In bloodstream infections from ESBL organisms, carbapenems are preferred over piperacillin/tazobactam. They are shown to have a lower risk of mortality. MIXED ANAEROBIC FLORA PRESENT.  CALL LAB IF FURTHER IID REQUIRED.    Report Status 04/20/2020 FINAL  Final   Organism ID, Bacteria KLEBSIELLA PNEUMONIAE  Final   Organism ID, Bacteria PSEUDOMONAS AERUGINOSA  Final   Organism ID, Bacteria ESCHERICHIA COLI  Final      Susceptibility   Escherichia coli - MIC*    AMPICILLIN >=32 RESISTANT Resistant     CEFAZOLIN >=64 RESISTANT Resistant     CEFEPIME 16 RESISTANT Resistant     CEFTAZIDIME RESISTANT Resistant     CEFTRIAXONE >=64 RESISTANT Resistant     CIPROFLOXACIN >=4 RESISTANT Resistant     GENTAMICIN <=1 SENSITIVE Sensitive      IMIPENEM <=0.25 SENSITIVE Sensitive     TRIMETH/SULFA >=320 RESISTANT Resistant     AMPICILLIN/SULBACTAM >=32 RESISTANT Resistant     PIP/TAZO 8 SENSITIVE Sensitive     * MODERATE ESCHERICHIA COLI   Klebsiella pneumoniae - MIC*    AMPICILLIN >=  32 RESISTANT Resistant     CEFAZOLIN <=4 SENSITIVE Sensitive     CEFEPIME <=0.12 SENSITIVE Sensitive     CEFTAZIDIME <=1 SENSITIVE Sensitive     CEFTRIAXONE <=0.25 SENSITIVE Sensitive     CIPROFLOXACIN <=0.25 SENSITIVE Sensitive     GENTAMICIN <=1 SENSITIVE Sensitive     IMIPENEM <=0.25 SENSITIVE Sensitive     TRIMETH/SULFA <=20 SENSITIVE Sensitive     AMPICILLIN/SULBACTAM 16 INTERMEDIATE Intermediate     PIP/TAZO 8 SENSITIVE Sensitive     * ABUNDANT KLEBSIELLA PNEUMONIAE   Pseudomonas aeruginosa - MIC*    CEFTAZIDIME 4 SENSITIVE Sensitive     CIPROFLOXACIN <=0.25 SENSITIVE Sensitive     GENTAMICIN 2 SENSITIVE Sensitive     IMIPENEM 2 SENSITIVE Sensitive     PIP/TAZO 8 SENSITIVE Sensitive     CEFEPIME 4 SENSITIVE Sensitive     * ABUNDANT PSEUDOMONAS AERUGINOSA  MRSA PCR Screening     Status: None   Collection Time: 04/14/20  6:19 PM   Specimen: Nasopharyngeal  Result Value Ref Range Status   MRSA by PCR NEGATIVE NEGATIVE Final    Comment:        The GeneXpert MRSA Assay (FDA approved for NASAL specimens only), is one component of a comprehensive MRSA colonization surveillance program. It is not intended to diagnose MRSA infection nor to guide or monitor treatment for MRSA infections. Performed at [facility], [address], [place], [place] [address]   Urine Culture     Status: None   Collection Time: 04/15/20  4:28 AM   Specimen: Urine, Random  Result Value Ref Range Status   Specimen Description   Final    URINE, RANDOM Performed at [facility], [address], [place], [place] [address]    Special Requests   Final    NONE Performed at [facility], [address], [place], [place]  [address]    Culture   Final    NO GROWTH Performed at [facility], [place] [address] [place], [place] [address]    Report Status 04/16/2020 FINAL  Final  CULTURE, BLOOD (ROUTINE X 2) w Reflex to ID Panel     Status: None (Preliminary result)   Collection Time: 04/22/20  6:12 PM   Specimen: BLOOD  Result Value Ref Range Status   Specimen Description BLOOD FOOT  Final   Special Requests   Final    BOTTLES DRAWN AEROBIC AND ANAEROBIC Blood Culture adequate volume   Culture   Final    NO GROWTH < 12 HOURS Performed at [facility], [address], [place], [place] [address]    Report Status PENDING  Incomplete  CULTURE, BLOOD (ROUTINE X 2) w Reflex to ID Panel     Status: None (Preliminary result)   Collection Time: 04/22/20  6:17 PM   Specimen: BLOOD  Result Value Ref Range Status   Specimen Description BLOOD FOOT  Final   Special Requests   Final    BOTTLES DRAWN AEROBIC AND ANAEROBIC Blood Culture results may not be optimal due to an excessive volume of blood received in culture bottles   Culture   Final    NO GROWTH < 12 HOURS Performed at [facility], [address], [place], [place] [address]    Report Status PENDING  Incomplete         Radiology Studies: US Venous Img Upper Uni Right(DVT)  Result Date: 04/23/2020 CLINICAL DATA:  Right upper extremity pain and swelling for  1 week EXAM: RIGHT UPPER EXTREMITY VENOUS DOPPLER ULTRASOUND TECHNIQUE: Gray-scale sonography with graded compression, as well as color Doppler and duplex ultrasound were performed to evaluate the upper extremity deep venous system from the level of the subclavian vein and including the jugular, axillary, basilic, radial, ulnar and upper cephalic vein. Spectral Doppler was utilized to evaluate flow at rest and with distal augmentation maneuvers. COMPARISON:  None. FINDINGS: Contralateral Subclavian Vein: Respiratory phasicity is normal and symmetric with the symptomatic side. No  evidence of thrombus. Normal compressibility. Internal Jugular Vein: No evidence of thrombus. Normal compressibility, respiratory phasicity and response to augmentation. Subclavian Vein: No evidence of thrombus. Normal compressibility, respiratory phasicity and response to augmentation. Axillary Vein: No evidence of thrombus. Normal compressibility, respiratory phasicity and response to augmentation. Cephalic Vein: No evidence of thrombus. Normal compressibility, respiratory phasicity and response to augmentation. Basilic Vein: No evidence of thrombus. Normal compressibility, respiratory phasicity and response to augmentation. Brachial Veins: No evidence of thrombus. Normal compressibility, respiratory phasicity and response to augmentation. Radial Veins: No evidence of thrombus. Normal compressibility, respiratory phasicity and response to augmentation. Ulnar Veins: No evidence of thrombus. Normal compressibility, respiratory phasicity and response to augmentation. IMPRESSION: No evidence of DVT within the right upper extremity. Electronically Signed   By: Jerilynn Mages.  Shick M.D.   On: 04/23/2020 07:43        Scheduled Meds: . acidophilus  1 capsule Oral Daily  . amiodarone  400 mg Oral BID  . budesonide (PULMICORT) nebulizer solution  0.5 mg Nebulization BID  . Chlorhexidine Gluconate Cloth  6 each Topical Daily  . guaiFENesin-dextromethorphan  10 mL Oral Q8H  . heparin injection (subcutaneous)  5,000 Units Subcutaneous Q8H  . ipratropium-albuterol  3 mL Nebulization Q6H  . methylPREDNISolone (SOLU-MEDROL) injection  40 mg Intravenous Q12H  . mometasone-formoterol  2 puff Inhalation BID  . pantoprazole (PROTONIX) IV  40 mg Intravenous Daily  . sodium chloride flush  5 mL Intracatheter Q8H  . sodium zirconium cyclosilicate  10 g Oral Daily   Continuous Infusions: . sodium chloride 10 mL/hr at 04/22/20 2304  . meropenem (MERREM) IV 1 g (04/22/20 2313)     LOS: 9 days    Time spent: 34  mins    Wyvonnia Dusky, MD [facility] Pager [phone]  If 7PM-7AM, please contact night-coverage [email or web address] 04/23/2020, 8:00 AM '

## 2020-04-23 NOTE — Consult Note (Signed)
Pharmacy Antibiotic Note  Melanie Bowen is a 59 y.o. female admitted on 04/14/2020 with intra-abdominal infection - decompensating on Zosyn.   -perforated diverticulitis and peridiverticular abscess Pharmacy has been consulted for Meropenem dosing.  ID following  Plan: Continue meropenem 1g q12h,   Crcl 21 ml/min  Pt is an HD patient now - will adjust dosing to 500mg  qd  8/16 wound cx: ABUNDANT KLEBSIELLA PNEUMONIAE  ABUNDANT PSEUDOMONAS AERUGINOSA  MODERATE ESCHERICHIA COLI  MIXED ANAEROBIC FLORA PRESENT.  (Klebsiella, E Coli, and pseudomonas sensitive to imipenem)   Height: 5\' 6"  (167.6 cm) Weight: 131 kg (288 lb 12.8 oz) IBW/kg (Calculated) : 59.3  Temp (24hrs), Avg:98.1 F (36.7 C), Min:97.6 F (36.4 C), Max:98.7 F (37.1 C)  Recent Labs  Lab 04/16/20 0900 04/16/20 1607 04/17/20 0603 04/18/20 0442 04/19/20 0458 04/19/20 0848 04/22/20 1112 04/23/20 0545  WBC  --   --  22.6* 24.8* 24.7*  --  35.1* 40.1*  CREATININE 4.65*  --  4.51* 4.21*  --  3.89* 4.61*  --   LATICACIDVEN  --  1.6  --   --   --   --   --   --     Estimated Creatinine Clearance: 18.5 mL/min (A) (by C-G formula based on SCr of 4.61 mg/dL (H)).    Allergies  Allergen Reactions   Acetaminophen Nausea And Vomiting   Ibuprofen Other (See Comments)    Reports causes bleeding   Gabapentin Rash   Morphine Itching and Rash   Morphine And Related Itching   Zanaflex  [Tizanidine] Rash    Antimicrobials this admission: Valacyclovir 8/17 >> Ceftriaxone/metronidazole 8/16>8/17 Zosyn 8/16 >>8/17 Meropenem 8/17 >> Anidulafungin 8/17 >> Levaquin x 1 8/20    Thank you for allowing pharmacy to be a part of this patient's care.  Lu Duffel, PharmD Clinical Pharmacist 04/23/2020 8:10 AM

## 2020-04-23 NOTE — Consult Note (Addendum)
Pharmacy Heparin Induced Thrombocytopenia (HIT) Note:  Melanie Bowen is an 59 y.o. female was being evaluated for HIT.   Enoxaparin was started 8/16 for DVT prophylaxis, and baseline platelets were 294.   HIT labs were ordered on 8/20 when platelets dropped to 31.  Auto-populate labs:  Heparin Induced Plt Ab  Date/Time Value Ref Range Status  04/18/2020 04:51 PM 0.071 0.000 - 0.400 OD Final    Comment:    (NOTE) Performed At: [facility] [place], [place] [id number] Rush Farmer MD TM:[id number]      CALCULATE SCORE:  4Ts (see the HIT Algorithm) Score  Thrombocytopenia 2  Timing 0  Thrombosis 0 (no annotated mention of thrombosis)  Other causes of thrombocytopenia 0 (infection, possibly drug induced per piperacillin)  Total 2    Recommendations (A or B) are based on available lab results (HIT antibody and/or SRA) and the HIT algorithm   A. HIT antibody result available  HIT ruled out    No SRA needed  Continue heparin / LMWH  No allergy documentation needed  B. SRA result availability N/A  Name of MD Contacted: [place] (Discussed with provider) Labs ordered:  SRA not needed  Heparin allergy:  No allergy documentation needed.  Anticoagulation plans: Recommend to resume DVT prophylaxis with enoxaparin or sub q heparin if clinically appropriate   Lu Duffel, PharmD, BCPS Clinical Pharmacist 04/23/2020 7:55 AM

## 2020-04-23 NOTE — Evaluation (Signed)
Occupational Therapy Evaluation Patient Details Name: Melanie Bowen MRN: [id number] DOB: 05/12/1961 Today's Date: 04/23/2020    History of Present Illness Signora Zucco is a 28yoF who comes to [facility] on 8/16 c LLQ ABD pain. Pt admitted c Acute sigmoid diverticulitis with diverticular abscess. PMH: COPD, asthma, HTN, diverticulosis, HIV, GERD, depression, bilat TKA, 3 level lumbar fusion, susequent lumbar hardware removal. Pt moved to ICU early in stay, inititated on HD on 04/16/20. P tha shad AF RVR issues.   Clinical Impression   Patient presenting with decreased I in self care, balance, functional mobility/transfers, endurance, and safety awareness. Patient reports living home alone, working full time, without use of AD PTA. However, pt reports having "bad days" and calling family or friends to assist with self care and IADL tasks as needed. Patient currently seen by OT at bed level only secondary to temporary femoral catheter placed and OOB activities are contraindicated. Pt needing significant assist for LB self care, rolling, and toileting secondary to these limitations and weakness. Pt requesting CIR at discharge. Pt reports she will have 24/7 supervision/assist at discharge. Patient will benefit from acute OT to increase overall independence in the areas of ADLs, functional mobility, and safety awareness in order to safely discharge to next venue of care.    Follow Up Recommendations  CIR , Supervision - 24/7   Equipment Recommendations  Other (comment) (defer to next venue of care)    Recommendations for Other Services Other (comment) (none at this time)     Precautions / Restrictions Precautions Precautions: Fall;Other (comment) Precaution Comments: femoral cath- bed level only Restrictions Weight Bearing Restrictions: No      Mobility Bed Mobility Overal bed mobility: Needs Assistance Bed Mobility: Rolling Rolling: Mod assist - max A   General bed mobility comments:  increased time and cuing for hand placement  Transfers      General transfer comment: deferred secondary to contraindicated with femoral cath        ADL either performed or assessed with clinical judgement   ADL Overall ADL's : Needs assistance/impaired Eating/Feeding: Set up   Grooming: Wash/dry face;Oral care;Bed level;Set up   Upper Body Bathing: Moderate assistance;Bed level   Lower Body Bathing: Bed level;Maximal assistance   Upper Body Dressing : Moderate assistance;Maximal assistance;Bed level   Lower Body Dressing: Total assistance;+2 for physical assistance    General ADL Comments: Pt can be seen bed level only secondary to temp femoral cath placed. Pt needing set up A for grooming tasks bed level this session. Anticipate total A for LB self care bed level and mod - max A for UB self care based on pt's limitations in R UE as well.     Vision Baseline Vision/History: Wears glasses Wears Glasses: Reading only Patient Visual Report: No change from baseline Vision Assessment?: No apparent visual deficits            Pertinent Vitals/Pain Pain Assessment: No/denies pain     Hand Dominance Right   Extremity/Trunk Assessment Upper Extremity Assessment Upper Extremity Assessment: RUE deficits/detail RUE Deficits / Details: pt reports rotator cuff tear and needing surgery in the further. Shoulder elevation limited to ~90 degrees with active assist and it is painful. RUE Sensation: WNL RUE Coordination: WNL   Lower Extremity Assessment Lower Extremity Assessment: Defer to PT evaluation       Communication Communication Communication: No difficulties   Cognition Arousal/Alertness: Awake/alert Behavior During Therapy: WFL for tasks assessed/performed Overall Cognitive Status: Within Functional Limits for tasks  assessed                    Home Living Family/patient expects to be discharged to:: Private residence Living Arrangements: Alone Available  Help at Discharge: Family;Available 24 hours/day;Friend(s) Type of Home: House Home Access: Stairs to enter [employer] of Steps: 3 Entrance Stairs-Rails: Can reach both Home Layout: One level     Bathroom Shower/Tub: Teacher, early years/pre: Standard Bathroom Accessibility: Yes   Home Equipment: None          Prior Functioning/Environment Level of Independence: Needs assistance        Comments: Pt reports she was still working full time but would have "bad days" and need family or friends to come and assist with self care and IADL tasks.        OT Problem List: Decreased strength;Pain;Decreased range of motion;Decreased activity tolerance;Decreased safety awareness;Impaired balance (sitting and/or standing)      OT Treatment/Interventions: Self-care/ADL training;Therapeutic exercise;Therapeutic activities;Energy conservation;DME and/or AE instruction;Patient/family education;Balance training    OT Goals(Current goals can be found in the care plan section) Acute Rehab OT Goals Patient Stated Goal: go home OT Goal Formulation: With patient Time For Goal Achievement: 05/07/20 Potential to Achieve Goals: Fair ADL Goals Pt Will Perform Grooming: with supervision;bed level Pt/caregiver will Perform Home Exercise Program: With written HEP provided;With Supervision;Both right and left upper extremity Additional ADL Goal #1: Pt will verbalize 3 energy conservation techniques for self care tasks.  OT Frequency: Min 1X/week   Barriers to D/C:    family does lives alone but reports great support and assist from family and friends          AM-PAC OT "6 Clicks" Daily Activity     Outcome Measure Help from another person eating meals?: A Little Help from another person taking care of personal grooming?: A Little Help from another person toileting, which includes using toliet, bedpan, or urinal?: Total Help from another person bathing (including washing,  rinsing, drying)?: A Lot Help from another person to put on and taking off regular upper body clothing?: A Lot Help from another person to put on and taking off regular lower body clothing?: Total 6 Click Score: 12   End of Session Nurse Communication: Precautions  Activity Tolerance: Patient tolerated treatment well Patient left: in bed;with call bell/phone within reach;with bed alarm set  OT Visit Diagnosis: Muscle weakness (generalized) (M62.81)                Time: 1916-6060 OT Time Calculation (min): 20 min Charges:  OT General Charges $OT Visit: 1 Visit OT Evaluation $OT Eval Moderate Complexity: 1 Mod OT Treatments $Self Care/Home Management : 8-22 mins  Darleen Crocker, MS, OTR/L , CBIS ascom [phone]  04/23/20, 11:19 AM

## 2020-04-23 NOTE — Progress Notes (Signed)
This note also relates to the following rows which could not be included: Pulse Rate - Cannot attach notes to unvalidated device data Resp - Cannot attach notes to unvalidated device data  Hd completed  

## 2020-04-23 NOTE — Progress Notes (Signed)
This note also relates to the following rows which could not be included: Pulse Rate - Cannot attach notes to unvalidated device data Resp - Cannot attach notes to unvalidated device data BP - Cannot attach notes to unvalidated device data  Hd started  

## 2020-04-23 NOTE — Progress Notes (Signed)
Right femoral tri cath removed per protocol. Cath tip was intact. Pt tolerated procedure. I will continue to assess.

## 2020-04-23 NOTE — Progress Notes (Signed)
[facility]  ROUNDING NOTE   Subjective:  Patient resting in bed,in no acute distress,will plan for dialysis today, discussed possible need to continue dialysis as outpatient.   Objective:  Vital signs in last 24 hours:  Temp:  [97.6 F (36.4 C)-98.7 F (37.1 C)] 98.1 F (36.7 C) (08/25 0757) Pulse Rate:  [82-110] 99 (08/25 0757) Resp:  [11-20] 19 (08/25 0757) BP: (111-136)/(56-94) 113/56 (08/25 0757) SpO2:  [95 %-99 %] 95 % (08/25 0912) Weight:  [131 kg] 131 kg (08/24 1649)  Weight change:  Filed Weights   04/13/20 2127 04/14/20 1822 04/22/20 1649  Weight: 117.9 kg 121.6 kg 131 kg    Intake/Output: I/O last 3 completed shifts: In: 449.5 [I.V.:92.5; IV Piggyback:357] Out: 495 [Urine:405; Drains:90]   Intake/Output this shift:  No intake/output data recorded.  Physical Exam: General:  Awake,alert, in no acute distress  Head:  Normocephalic, atraumatic.O2 via [place] on flow   Eyes:  Sclerae and conjunctivae clear  Neck:  Supple  Lungs:    Normal and symmetrical respiratory  Effort,Lungs diminished at the  bases  Heart:  Regular rate and rhythm  Abdomen:   Soft,non tender  Extremities:  No peripheral edema.  Neurologic:  Awake, alert, oriented  Skin:  No lesions or rashes  Access:  Left femoral dialysis catheter    Basic Metabolic Panel: Recent Labs  Lab 04/17/20 0603 04/17/20 0603 04/18/20 0442 04/18/20 0442 04/19/20 0848 04/22/20 0436 04/22/20 1112 04/23/20 0545  NA 136  --  136  --  136  --  135 134*  K 4.9  --  4.8  --  5.5*  --  5.0 5.2*  CL 102  --  101  --  100  --  96* 95*  CO2 17*  --  20*  --  18*  --  19* 20*  GLUCOSE 110*  --  126*  --  143*  --  198* 186*  BUN 63*  --  63*  --  61*  --  95* 108*  CREATININE 4.51*  --  4.21*  --  3.89*  --  4.61* 5.00*  CALCIUM 6.9*   < > 7.3*   < > 8.2*  --  7.9* 8.4*  MG 1.7  --  1.9  --  1.9 2.3  --  2.4  PHOS 5.3*  --  5.9*  --  6.2* 8.5*  --  10.0*   < > = values in this interval not  displayed.    Liver Function Tests: Recent Labs  Lab 04/17/20 0603 04/18/20 0442 04/19/20 0848 04/22/20 1112 04/23/20 0545  AST 791* 247* 94* 40 32  ALT 1,153* 746* 419* 113* 83*  ALKPHOS 127* 120 113 107 106  BILITOT 0.9 0.7 1.2 0.7 1.2  PROT 5.5* 5.5* 5.2* 5.3* 5.3*  ALBUMIN 2.1* 2.0* 1.9* 2.1* 2.1*   No results for input(s): LIPASE, AMYLASE in the last 168 hours. No results for input(s): AMMONIA in the last 168 hours.  CBC: Recent Labs  Lab 04/17/20 0603 04/18/20 0442 04/19/20 0458 04/22/20 1112 04/23/20 0545  WBC 22.6* 24.8* 24.7* 35.1* 40.1*  NEUTROABS  --   --   --  32.0*  --   HGB 11.5* 12.0 11.5* 11.4* 11.1*  HCT 31.5* 32.4* 31.3* 32.9* 31.1*  MCV 84.7 83.5 82.2 87.7 85.9  PLT 49* 31* 36* 130* 194    Cardiac Enzymes: No results for input(s): CKTOTAL, CKMB, CKMBINDEX, TROPONINI in the last 168 hours.  BNP: Invalid input(s): POCBNP  CBG: No results for input(s): GLUCAP in the last 168 hours.  Microbiology: Results for orders placed or performed during the hospital encounter of 04/14/20  SARS Coronavirus 2 by RT PCR (hospital order, performed in [facility] hospital lab) Nasopharyngeal Nasopharyngeal Swab     Status: None   Collection Time: 04/14/20  2:17 AM   Specimen: Nasopharyngeal Swab  Result Value Ref Range Status   SARS Coronavirus 2 NEGATIVE NEGATIVE Final    Comment: (NOTE) SARS-CoV-2 target nucleic acids are NOT DETECTED.  The SARS-CoV-2 RNA is generally detectable in upper and lower respiratory specimens during the acute phase of infection. The lowest concentration of SARS-CoV-2 viral copies this assay can detect is 250 copies / mL. A negative result does not preclude SARS-CoV-2 infection and should not be used as the sole basis for treatment or other patient management decisions.  A negative result may occur with improper specimen collection / handling, submission of specimen other than nasopharyngeal swab, presence of viral mutation(s)  within the areas targeted by this assay, and inadequate number of viral copies (<250 copies / mL). A negative result must be combined with clinical observations, patient history, and epidemiological information.  Fact Sheet for Patients:   StrictlyIdeas.no  Fact Sheet for Healthcare Providers: BankingDealers.co.za  This test is not yet approved or  cleared by the [place] FDA and has been authorized for detection and/or diagnosis of SARS-CoV-2 by FDA under an Emergency Use Authorization (EUA).  This EUA will remain in effect (meaning this test can be used) for the duration of the COVID-19 declaration under Section 564(b)(1) of the Act, 21 U.S.C. section 360bbb-3(b)(1), unless the authorization is terminated or revoked sooner.  Performed at [facility], [address] [place], [place] [address]   CULTURE, BLOOD (ROUTINE X 2) w Reflex to ID Panel     Status: None   Collection Time: 04/14/20  8:14 AM   Specimen: BLOOD  Result Value Ref Range Status   Specimen Description BLOOD LEFT [facility]  Final   Special Requests   Final    BOTTLES DRAWN AEROBIC AND ANAEROBIC Blood Culture adequate volume   Culture   Final    NO GROWTH 5 DAYS Performed at [facility], [address] [place], [place] [address]    Report Status 04/19/2020 FINAL  Final  CULTURE, BLOOD (ROUTINE X 2) w Reflex to ID Panel     Status: None   Collection Time: 04/14/20  8:14 AM   Specimen: BLOOD  Result Value Ref Range Status   Specimen Description BLOOD LEFT WRIST  Final   Special Requests   Final    BOTTLES DRAWN AEROBIC AND ANAEROBIC Blood Culture adequate volume   Culture   Final    NO GROWTH 5 DAYS Performed at [facility], [address] [place], [place] [address]    Report Status 04/19/2020 FINAL  Final  Aerobic/Anaerobic Culture (surgical/deep wound)     Status: None   Collection Time: 04/14/20 11:14 AM   Specimen: Abscess   Result Value Ref Range Status   Specimen Description   Final    ABSCESS Performed at [facility], [address] [place], [place] [address]    Special Requests   Final    NONE Performed at [facility], [address] [place], [place] [address]    Gram Stain   Final    NO WBC SEEN ABUNDANT GRAM NEGATIVE RODS FEW GRAM POSITIVE COCCI FEW GRAM POSITIVE RODS Performed at  [facility], [place] [address] [place], [place] [address]    Culture   Final    ABUNDANT KLEBSIELLA PNEUMONIAE ABUNDANT PSEUDOMONAS AERUGINOSA MODERATE ESCHERICHIA COLI Confirmed Extended Spectrum Beta-Lactamase Producer (ESBL).  In bloodstream infections from ESBL organisms, carbapenems are preferred over piperacillin/tazobactam. They are shown to have a lower risk of mortality. MIXED ANAEROBIC FLORA PRESENT.  CALL LAB IF FURTHER IID REQUIRED.    Report Status 04/20/2020 FINAL  Final   Organism ID, Bacteria KLEBSIELLA PNEUMONIAE  Final   Organism ID, Bacteria PSEUDOMONAS AERUGINOSA  Final   Organism ID, Bacteria ESCHERICHIA COLI  Final      Susceptibility   Escherichia coli - MIC*    AMPICILLIN >=32 RESISTANT Resistant     CEFAZOLIN >=64 RESISTANT Resistant     CEFEPIME 16 RESISTANT Resistant     CEFTAZIDIME RESISTANT Resistant     CEFTRIAXONE >=64 RESISTANT Resistant     CIPROFLOXACIN >=4 RESISTANT Resistant     GENTAMICIN <=1 SENSITIVE Sensitive     IMIPENEM <=0.25 SENSITIVE Sensitive     TRIMETH/SULFA >=320 RESISTANT Resistant     AMPICILLIN/SULBACTAM >=32 RESISTANT Resistant     PIP/TAZO 8 SENSITIVE Sensitive     * MODERATE ESCHERICHIA COLI   Klebsiella pneumoniae - MIC*    AMPICILLIN >=32 RESISTANT Resistant     CEFAZOLIN <=4 SENSITIVE Sensitive     CEFEPIME <=0.12 SENSITIVE Sensitive     CEFTAZIDIME <=1 SENSITIVE Sensitive     CEFTRIAXONE <=0.25 SENSITIVE Sensitive     CIPROFLOXACIN <=0.25 SENSITIVE Sensitive     GENTAMICIN <=1 SENSITIVE Sensitive     IMIPENEM <=0.25  SENSITIVE Sensitive     TRIMETH/SULFA <=20 SENSITIVE Sensitive     AMPICILLIN/SULBACTAM 16 INTERMEDIATE Intermediate     PIP/TAZO 8 SENSITIVE Sensitive     * ABUNDANT KLEBSIELLA PNEUMONIAE   Pseudomonas aeruginosa - MIC*    CEFTAZIDIME 4 SENSITIVE Sensitive     CIPROFLOXACIN <=0.25 SENSITIVE Sensitive     GENTAMICIN 2 SENSITIVE Sensitive     IMIPENEM 2 SENSITIVE Sensitive     PIP/TAZO 8 SENSITIVE Sensitive     CEFEPIME 4 SENSITIVE Sensitive     * ABUNDANT PSEUDOMONAS AERUGINOSA  MRSA PCR Screening     Status: None   Collection Time: 04/14/20  6:19 PM   Specimen: Nasopharyngeal  Result Value Ref Range Status   MRSA by PCR NEGATIVE NEGATIVE Final    Comment:        The GeneXpert MRSA Assay (FDA approved for NASAL specimens only), is one component of a comprehensive MRSA colonization surveillance program. It is not intended to diagnose MRSA infection nor to guide or monitor treatment for MRSA infections. Performed at [facility], [address], [place], [place] [address]   Urine Culture     Status: None   Collection Time: 04/15/20  4:28 AM   Specimen: Urine, Random  Result Value Ref Range Status   Specimen Description   Final    URINE, RANDOM Performed at [facility], [address] [place], [place] [address]    Special Requests   Final    NONE Performed at [facility], [address] [place], [place] [address]    Culture   Final    NO GROWTH Performed at [facility], [place] [address], [place], [place] [address]    Report Status 04/16/2020 FINAL  Final  CULTURE, BLOOD (ROUTINE X 2) w Reflex to ID Panel     Status: None (Preliminary result)  Collection Time: 04/22/20  6:12 PM   Specimen: BLOOD  Result Value Ref Range Status   Specimen Description BLOOD FOOT  Final   Special Requests   Final    BOTTLES DRAWN AEROBIC AND ANAEROBIC Blood Culture adequate volume   Culture   Final    NO GROWTH < 12 HOURS Performed at [facility], [address], [place], [place] [address]    Report Status PENDING  Incomplete  CULTURE, BLOOD (ROUTINE X 2) w Reflex to ID Panel     Status: None (Preliminary result)   Collection Time: 04/22/20  6:17 PM   Specimen: BLOOD  Result Value Ref Range Status   Specimen Description BLOOD FOOT  Final   Special Requests   Final    BOTTLES DRAWN AEROBIC AND ANAEROBIC Blood Culture results may not be optimal due to an excessive volume of blood received in culture bottles   Culture   Final    NO GROWTH < 12 HOURS Performed at [facility], [address] [place], [place] [address]    Report Status PENDING  Incomplete    Coagulation Studies: No results for input(s): LABPROT, INR in the last 72 hours.  Urinalysis: No results for input(s): COLORURINE, LABSPEC, PHURINE, GLUCOSEU, HGBUR, BILIRUBINUR, KETONESUR, PROTEINUR, UROBILINOGEN, NITRITE, LEUKOCYTESUR in the last 72 hours.  Invalid input(s): APPERANCEUR    Imaging: US Venous Img Upper Uni Right(DVT)  Result Date: 04/23/2020 CLINICAL DATA:  Right upper extremity pain and swelling for 1 week EXAM: RIGHT UPPER EXTREMITY VENOUS DOPPLER ULTRASOUND TECHNIQUE: Gray-scale sonography with graded compression, as well as color Doppler and duplex ultrasound were performed to evaluate the upper extremity deep venous system from the level of the subclavian vein and including the jugular, axillary, basilic, radial, ulnar and upper cephalic vein. Spectral Doppler was utilized to evaluate flow at rest and with distal augmentation maneuvers. COMPARISON:  None. FINDINGS: Contralateral Subclavian Vein: Respiratory phasicity is normal and symmetric with the symptomatic side. No evidence of thrombus. Normal compressibility. Internal Jugular Vein: No evidence of thrombus. Normal compressibility, respiratory phasicity and response to augmentation. Subclavian Vein: No evidence of thrombus. Normal compressibility, respiratory phasicity and  response to augmentation. Axillary Vein: No evidence of thrombus. Normal compressibility, respiratory phasicity and response to augmentation. Cephalic Vein: No evidence of thrombus. Normal compressibility, respiratory phasicity and response to augmentation. Basilic Vein: No evidence of thrombus. Normal compressibility, respiratory phasicity and response to augmentation. Brachial Veins: No evidence of thrombus. Normal compressibility, respiratory phasicity and response to augmentation. Radial Veins: No evidence of thrombus. Normal compressibility, respiratory phasicity and response to augmentation. Ulnar Veins: No evidence of thrombus. Normal compressibility, respiratory phasicity and response to augmentation. IMPRESSION: No evidence of DVT within the right upper extremity. Electronically Signed   By: Jerilynn Mages.  Shick M.D.   On: 04/23/2020 07:43     Medications:    sodium chloride 10 mL/hr at 04/22/20 2304   meropenem (MERREM) IV 1 g (04/22/20 2313)    acidophilus  1 capsule Oral Daily   amiodarone  400 mg Oral BID   budesonide (PULMICORT) nebulizer solution  0.5 mg Nebulization BID   Chlorhexidine Gluconate Cloth  6 each Topical Daily   docusate sodium  200 mg Oral BID   guaiFENesin-dextromethorphan  10 mL Oral Q8H   heparin injection (subcutaneous)  5,000 Units Subcutaneous Q8H   ipratropium-albuterol  3 mL Nebulization Q6H   methylPREDNISolone (SOLU-MEDROL) injection  40 mg Intravenous Q12H   mometasone-formoterol  2 puff Inhalation BID  pantoprazole (PROTONIX) IV  40 mg Intravenous Daily   sodium chloride flush  5 mL Intracatheter Q8H   sodium zirconium cyclosilicate  10 g Oral Daily   acetaminophen **OR** acetaminophen, ALPRAZolam, bisacodyl, heparin NICU/SCN flush, HYDROcodone-acetaminophen, HYDROmorphone (DILAUDID) injection, ipratropium-albuterol, labetalol, magic mouthwash **AND** lidocaine, magnesium hydroxide, menthol-cetylpyridinium, phenol  Assessment/ Plan:  59 y.o.  female with past medical history of HIV, COPD, hypertension who was admitted with shortness of breath, colonic diverticular abscess, diverticulitis and subsequently found to have acute kidney injury.  1.  Acute kidney injury secondary to ATN with multiple factors from severe sepsis and multiple IV contrast exposures.   F/C draining minimal dark amber urine.  She will go for dialysis today.  Dialysis catheter not working well and there is some fear for thrombosis potentially leading to elevated WBC count per infectious disease.  We will remove temporary dialysis catheter after dialysis treatment today.  2.  Hyperkalemia.   Potassium slightly higher today at 5.2.  Continue to monitor potassium.  Should come down with dialysis treatment today.  3.  Metabolic acidosis. CO2 level was 20 today. Will continue monitoring    LOS: 9 Melanie Bowen 8/25/202111:17 AM

## 2020-04-23 NOTE — Progress Notes (Signed)
[facility] SUR[facility]ICAL PROGRESS NOTE (cpt [phone])  Hospital Day(s): 9.   Interval History:  Patient seen and examined Transferred out of ICU last night.  She continues to maintain hemodynamics and HR better controlled on PO amiodarone. She did need additional bolus yesterday.  Patient reports she is feeling better, denies abdominal pain, nausea, emesis, fever. Biggest complaint is wanting fruit in her diet.  She continues to have significant leukocytosis to 40.1K; ID following; concern this may be from dialysis catheter. BCx redrawn yesterday (08/24) and pending. Initial BCx without growth. Continues on Meropenem. She is also getting steroids which may be contributing.  CMP and electrolytes consistent with degree of renal failure Urine output remains low, only 285 ccs in last 24 hours JPwithlow output; remainsseropurulent Cx for drain placement grew out pseudomonas, kleb andESBLe.coli On soft diet; tolerating well She is profoundly weak; likely need extended rehab  Review of Systems:  Constitutional: denies fever, chills  HEENT: denies cough or congestion  Respiratory: denies any shortness of breath  Cardiovascular: denies chest pain or palpitations  Gastrointestinal: denied abdominal pain, denied N/V, or diarrhea/and bowel function as per interval history Genitourinary: denies burning with urination or urinary frequency Musculoskeletal: denies pain, decreased motor or sensation  Vital signs in last 24 hours: [min-max] current  Temp:  [97.6 F (36.4 C)-98.7 F (37.1 C)] 98 F (36.7 C) (08/25 0502) Pulse Rate:  [82-110] 97 (08/25 0502) Resp:  [11-20] 20 (08/24 2123) BP: (110-136)/(64-94) 132/67 (08/25 0502) SpO2:  [95 %-99 %] 95 % (08/25 0502) Weight:  [131 kg] 131 kg (08/24 1649)     Height: 5\' 6"  (167.6 cm) Weight: 131 kg BMI (Calculated): 46.64   Intake/Output last 2 shifts:  08/24 0701 - 08/25 0700 In: 192.5 [I.V.:92.5; IV Piggyback:100] Out: 285  [Urine:285]   Physical Exam:  Constitutional: alert, cooperative and no distress, she is tearful and wants to get better HENT: normocephalic without obvious abnormality  Eyes: PERRL, EOM's grossly intact and symmetric  Respiratory: breathingcertainly less labored, on [place], still with some wheezing, suspect she is closer to baseline Cardiovascular: regular rate, regular Gastrointestinal:obese,soft, non-tender, and non-distended, no rebound/guarding/peritonitis.JP in LLQ with seropurulent drainage, this appears to be slowing Genitourinary:Foley in place, minimalverydark urine in bag Musculoskeletal:+ 1 pitting edema to lower extremities, HD catheter in right groin   Labs:  CBC Latest Ref Rng & Units 04/23/2020 04/22/2020 04/19/2020  WBC 4.0 - 10.5 K/uL 40.1(H) 35.1(H) 24.7(H)  Hemoglobin 12.0 - 15.0 g/dL 11.1(L) 11.4(L) 11.5(L)  Hematocrit 36 - 46 % 31.1(L) 32.9(L) 31.3(L)  Platelets 150 - 400 K/uL 194 130(L) 36(L)   CMP Latest Ref Rng & Units 04/22/2020 04/19/2020 04/18/2020  Glucose 70 - 99 mg/dL 198(H) 143(H) 126(H)  BUN 6 - 20 mg/dL 95(H) 61(H) 63(H)  Creatinine 0.44 - 1.00 mg/dL 4.61(H) 3.89(H) 4.21(H)  Sodium 135 - 145 mmol/L 135 136 136  Potassium 3.5 - 5.1 mmol/L 5.0 5.5(H) 4.8  Chloride 98 - 111 mmol/L 96(L) 100 101  CO2 22 - 32 mmol/L 19(L) 18(L) 20(L)  Calcium 8.9 - 10.3 mg/dL 7.9(L) 8.2(L) 7.3(L)  Total Protein 6.5 - 8.1 g/dL 5.3(L) 5.2(L) 5.5(L)  Total Bilirubin 0.3 - 1.2 mg/dL 0.7 1.2 0.7  Alkaline Phos 38 - 126 U/L 107 113 120  AST 15 - 41 U/L 40 94(H) 247(H)  ALT 0 - 44 U/L 113(H) 419(H) 746(H)     Imaging studies: No new pertinent imaging studies   Assessment/Plan: (ICD-10's: K45.20) 59 y.o. female with markedly increased leukocytosis this  morning which I do not suspect is coming from intra-abdominal source, and otherwise continues to maintain hemodynamics, admitted withseptic shock secondary tosigmoid diverticulitis with abscess s/p percutaneous drainage on  08/16andshe continues to bewithout any evidence of peritonitis/pneumoperitoneum/pneumatosis on examination or imaging,complicated by pertinent comorbidities includingmarked COPD   - Okay to continue regular (or renal diet) diet - Continue IV Abx (meropenem); ID following and appreciate assistance; Cx with pseudomonas, kleb andESBLe.coli --> new BCx drawn on 08/24 and are pending   - Monitor abdominal examination; leukocytosis   - No emergent surgical intervention - Pain control prn; antiemetics prn - Maintain JP drain; record output             - Appreciate nephrology/cardiology assistance and recommendations  - Continue to work with therapies; suspect she will need significant rehab - Further management per primary service; we will continue to follow   All of the above findings and recommendations were discussed with the patient, and the medical team, and all of patient's questions were answered to her expressed satisfaction.  -- Edison Simon, PA-C [facility] 04/23/2020, 7:42 AM [phone] M-F: 7am - 4pm

## 2020-04-23 NOTE — Progress Notes (Signed)
Date of Admission:  04/14/2020      ID: Melanie Bowen is a 59 y.o. female  Principal Problem:   Severe sepsis with septic shock ([place]) Active Problems:   HTN (hypertension)   HIV (human immunodeficiency virus infection) ([place])   Diverticulitis of colon   Abscess of sigmoid colon due to diverticulitis   Acute renal failure (ARF) ([place])   Hypotension   Diverticulosis   Acute respiratory distress   DNR (do not resuscitate) discussion   Palliative care by specialist    Subjective: Pt says she is constipated- no bowel movt since admission   Medications:  . acidophilus  1 capsule Oral Daily  . amiodarone  400 mg Oral BID  . budesonide (PULMICORT) nebulizer solution  0.5 mg Nebulization BID  . Chlorhexidine Gluconate Cloth  6 each Topical Daily  . docusate sodium  200 mg Oral BID  . guaiFENesin-dextromethorphan  10 mL Oral Q8H  . heparin injection (subcutaneous)  5,000 Units Subcutaneous Q8H  . ipratropium-albuterol  3 mL Nebulization Q6H  . methylPREDNISolone (SOLU-MEDROL) injection  40 mg Intravenous Q12H  . mometasone-formoterol  2 puff Inhalation BID  . pantoprazole (PROTONIX) IV  40 mg Intravenous Daily  . sodium chloride flush  5 mL Intracatheter Q8H  . sodium zirconium cyclosilicate  10 g Oral Daily    Objective: Vital signs in last 24 hours: Temp:  [97.6 F (36.4 C)-98.7 F (37.1 C)] 98.5 F (36.9 C) (08/25 1115) Pulse Rate:  [82-110] 93 (08/25 1130) Resp:  [8-20] 8 (08/25 1130) BP: (111-136)/(56-94) 113/58 (08/25 1130) SpO2:  [95 %-99 %] 95 % (08/25 0912) Weight:  [131 kg] 131 kg (08/24 1649)  PHYSICAL EXAM:  General: Alert, cooperative, no distress, appears stated age.  Heart: Regular rate and rhythm, no murmur, rub or gallop. Abdomen: Soft, non-tender,not distended. Left quadrant drain present Extremities: rt femoral line Skin: No rashes or lesions. Or bruising Lymph: Cervical, supraclavicular normal. Neurologic: Grossly non-focal  Lab  Results Recent Labs    04/22/20 1112 04/23/20 0545  WBC 35.1* 40.1*  HGB 11.4* 11.1*  HCT 32.9* 31.1*  NA 135 134*  K 5.0 5.2*  CL 96* 95*  CO2 19* 20*  BUN 95* 108*  CREATININE 4.61* 5.00*   Liver Panel Recent Labs    04/22/20 1112 04/23/20 0545  PROT 5.3* 5.3*  ALBUMIN 2.1* 2.1*  AST 40 32  ALT 113* 83*  ALKPHOS 107 106  BILITOT 0.7 1.2   Sedimentation Rate No results for input(s): ESRSEDRATE in the last 72 hours. C-Reactive Protein No results for input(s): CRP in the last 72 hours.  Microbiology:  Studies/Results: US Venous Img Upper Uni Right(DVT)  Result Date: 04/23/2020 CLINICAL DATA:  Right upper extremity pain and swelling for 1 week EXAM: RIGHT UPPER EXTREMITY VENOUS DOPPLER ULTRASOUND TECHNIQUE: Gray-scale sonography with graded compression, as well as color Doppler and duplex ultrasound were performed to evaluate the upper extremity deep venous system from the level of the subclavian vein and including the jugular, axillary, basilic, radial, ulnar and upper cephalic vein. Spectral Doppler was utilized to evaluate flow at rest and with distal augmentation maneuvers. COMPARISON:  None. FINDINGS: Contralateral Subclavian Vein: Respiratory phasicity is normal and symmetric with the symptomatic side. No evidence of thrombus. Normal compressibility. Internal Jugular Vein: No evidence of thrombus. Normal compressibility, respiratory phasicity and response to augmentation. Subclavian Vein: No evidence of thrombus. Normal compressibility, respiratory phasicity and response to augmentation. Axillary Vein: No evidence of thrombus. Normal compressibility, respiratory phasicity  and response to augmentation. Cephalic Vein: No evidence of thrombus. Normal compressibility, respiratory phasicity and response to augmentation. Basilic Vein: No evidence of thrombus. Normal compressibility, respiratory phasicity and response to augmentation. Brachial Veins: No evidence of thrombus. Normal  compressibility, respiratory phasicity and response to augmentation. Radial Veins: No evidence of thrombus. Normal compressibility, respiratory phasicity and response to augmentation. Ulnar Veins: No evidence of thrombus. Normal compressibility, respiratory phasicity and response to augmentation. IMPRESSION: No evidence of DVT within the right upper extremity. Electronically Signed   By: Jerilynn Mages.  Shick M.D.   On: 04/23/2020 07:43     Assessment/Plan: Septic shockresolved-secondary to perforated diverticulitisand peridiverticular abscess.has JP drain inserted on 04/14/20.  wasOnmeropenem and anidulafunginsince8/17/21. Latter stopped 04/21/20 . Because of pseudomonas, kleb andESBLe.coli in the culturecontinue meropenem.  Repeat CT abdomen- abscess has resolved May need repeat CT abdomen if leucocytosis persisit even after removing catheter  Severe leucocytosis- likely related to rt femoral line as it was malfunctioning yesterday- could be thrombus- need to remove it  Transaminitis due to shock liver much improved  AKI due to septic shock -getting dialysis  Thrombocytopenia much improved    HIV- well controlled in jan 2021 her Vl <20 and cd4 >1000. She was getting care at [facility] and saw her provider last in June 2021  [place] [facility]. This has 4 different meds and we need to adjust the dose because of AKI.  Other otion is to give a non NRTI based regime- can do Dolutegravir + prezcobix ( darunavir+ cobi) Will discuss with her HIV provider  Last CD4 count is 265 with 33% on 04/14/2020 and Vl <20 Prior to that in Jan 2021 Vl < 20 and cd4 1000  COPD- ontaperingsteroids Discussed the management with her and her daughter and the care team

## 2020-04-24 DIAGNOSIS — K5720 Diverticulitis of large intestine with perforation and abscess without bleeding: Secondary | ICD-10-CM | POA: Diagnosis not present

## 2020-04-24 DIAGNOSIS — A419 Sepsis, unspecified organism: Secondary | ICD-10-CM | POA: Diagnosis not present

## 2020-04-24 DIAGNOSIS — R6521 Severe sepsis with septic shock: Secondary | ICD-10-CM | POA: Diagnosis not present

## 2020-04-24 DIAGNOSIS — N179 Acute kidney failure, unspecified: Secondary | ICD-10-CM | POA: Diagnosis not present

## 2020-04-24 LAB — CBC
HCT: 29.8 % — ABNORMAL LOW (ref 36.0–46.0)
Hemoglobin: 10.1 g/dL — ABNORMAL LOW (ref 12.0–15.0)
MCH: 30.9 pg (ref 26.0–34.0)
MCHC: 33.9 g/dL (ref 30.0–36.0)
MCV: 91.1 fL (ref 80.0–100.0)
Platelets: 179 10*3/uL (ref 150–400)
RBC: 3.27 MIL/uL — ABNORMAL LOW (ref 3.87–5.11)
RDW: 18.8 % — ABNORMAL HIGH (ref 11.5–15.5)
WBC: 33.3 10*3/uL — ABNORMAL HIGH (ref 4.0–10.5)
nRBC: 0.1 % (ref 0.0–0.2)

## 2020-04-24 LAB — COMPREHENSIVE METABOLIC PANEL
ALT: 60 U/L — ABNORMAL HIGH (ref 0–44)
AST: 36 U/L (ref 15–41)
Albumin: 2 g/dL — ABNORMAL LOW (ref 3.5–5.0)
Alkaline Phosphatase: 101 U/L (ref 38–126)
Anion gap: 16 — ABNORMAL HIGH (ref 5–15)
BUN: 95 mg/dL — ABNORMAL HIGH (ref 6–20)
CO2: 22 mmol/L (ref 22–32)
Calcium: 7.9 mg/dL — ABNORMAL LOW (ref 8.9–10.3)
Chloride: 97 mmol/L — ABNORMAL LOW (ref 98–111)
Creatinine, Ser: 4.13 mg/dL — ABNORMAL HIGH (ref 0.44–1.00)
GFR calc Af Amer: 13 mL/min — ABNORMAL LOW (ref >60)
GFR calc non Af Amer: 11 mL/min — ABNORMAL LOW (ref >60)
Glucose, Bld: 143 mg/dL — ABNORMAL HIGH (ref 70–99)
Potassium: 5.2 mmol/L — ABNORMAL HIGH (ref 3.5–5.1)
Sodium: 135 mmol/L (ref 135–145)
Total Bilirubin: 1 mg/dL (ref 0.3–1.2)
Total Protein: 5 g/dL — ABNORMAL LOW (ref 6.5–8.1)

## 2020-04-24 LAB — MAGNESIUM: Magnesium: 2.2 mg/dL (ref 1.7–2.4)

## 2020-04-24 LAB — PHOSPHORUS: Phosphorus: 8.6 mg/dL — ABNORMAL HIGH (ref 2.5–4.6)

## 2020-04-24 MED ORDER — FAMOTIDINE 20 MG PO TABS: 40.0000 mg | ORAL_TABLET | Freq: Every day | ORAL | Status: DC

## 2020-04-24 MED ORDER — DOLUTEGRAVIR SODIUM 50 MG PO TABS
50.0000 mg | ORAL_TABLET | Freq: Every day | ORAL | Status: DC
  Administered 2020-04-24 – 2020-04-29 (×6): 50 mg via ORAL
  Filled 2020-04-24 (×6): qty 1

## 2020-04-24 MED ORDER — ALBUTEROL SULFATE (2.5 MG/3ML) 0.083% IN NEBU: 2.5000 mg | INHALATION_SOLUTION | RESPIRATORY_TRACT | Status: DC | PRN

## 2020-04-24 MED ORDER — RILPIVIRINE HCL 25 MG PO TABS
25.0000 mg | ORAL_TABLET | Freq: Every day | ORAL | Status: DC
  Filled 2020-04-24: qty 1

## 2020-04-24 MED ORDER — METHYLPREDNISOLONE SODIUM SUCC 40 MG IJ SOLR
40.0000 mg | INTRAMUSCULAR | Status: DC
  Administered 2020-04-24: 40 mg via INTRAVENOUS
  Filled 2020-04-24: qty 1

## 2020-04-24 MED ORDER — SIMETHICONE 80 MG PO CHEW
80.0000 mg | CHEWABLE_TABLET | Freq: Four times a day (QID) | ORAL | Status: DC | PRN
  Administered 2020-04-24 – 2020-04-29 (×3): 80 mg via ORAL
  Filled 2020-04-24 (×4): qty 1

## 2020-04-24 MED ORDER — NEPRO/CARBSTEADY PO LIQD
237.0000 mL | Freq: Two times a day (BID) | ORAL | Status: DC
  Administered 2020-04-24 – 2020-05-01 (×9): 237 mL via ORAL

## 2020-04-24 MED ORDER — IPRATROPIUM-ALBUTEROL 0.5-2.5 (3) MG/3ML IN SOLN
3.0000 mL | Freq: Two times a day (BID) | RESPIRATORY_TRACT | Status: DC
  Administered 2020-04-24: 3 mL via RESPIRATORY_TRACT
  Filled 2020-04-24: qty 3

## 2020-04-24 MED ORDER — DARUNAVIR-COBICISTAT 800-150 MG PO TABS
1.0000 | ORAL_TABLET | Freq: Every day | ORAL | Status: DC
  Filled 2020-04-24 (×2): qty 1

## 2020-04-24 MED ORDER — RENA-VITE PO TABS
1.0000 | ORAL_TABLET | Freq: Every day | ORAL | Status: DC
  Administered 2020-04-24 – 2020-05-22 (×23): 1 via ORAL
  Filled 2020-04-24 (×29): qty 1

## 2020-04-24 MED ORDER — IPRATROPIUM-ALBUTEROL 0.5-2.5 (3) MG/3ML IN SOLN
3.0000 mL | Freq: Four times a day (QID) | RESPIRATORY_TRACT | Status: DC | PRN
  Administered 2020-05-05: 3 mL via RESPIRATORY_TRACT
  Filled 2020-04-24: qty 3

## 2020-04-24 NOTE — Progress Notes (Signed)
[facility] SUR[facility]ICAL PROGRESS NOTE (cpt [phone])  Hospital Day(s): 10.   Interval History: Patient seen and examined, no acute events or new complaints overnight. Patient remains tearful and frustrated but she voiced numerous times she is determined to mobilize today and continue to get better. She denied any abdominal pain, fever, chills, nausea, or emesis. She is tolerating regular diet. Her leukocytosis is improved this morning to 33K. Dialysis catheter and foley were removed as precaution. Repeat BCx from 08/24 without growth. CMP remains consistent with degree of renal failure. She did have a bowel movement last night. She is determined to ambulate today.   Review of Systems:  Constitutional: denies fever, chills  HEENT: denies cough or congestion  Respiratory: denies any shortness of breath  Cardiovascular: denies chest pain or palpitations  Gastrointestinal: denies abdominal pain, N/V, or diarrhea/and bowel function as per interval history Genitourinary: denies burning with urination or urinary frequency Musculoskeletal: denies pain, decreased motor or sensation  Vital signs in last 24 hours: [min-max] current  Temp:  [97.5 F (36.4 C)-98.7 F (37.1 C)] 98.1 F (36.7 C) (08/26 0821) Pulse Rate:  [89-114] 98 (08/26 0821) Resp:  [8-20] 20 (08/26 0821) BP: (99-142)/(55-79) 138/76 (08/26 0821) SpO2:  [95 %-99 %] 97 % (08/26 0821) Weight:  [133.5 kg] 133.5 kg (08/26 0455)     Height: 5\' 6"  (167.6 cm) Weight: 133.5 kg BMI (Calculated): 47.53   Intake/Output last 2 shifts:  08/25 0701 - 08/26 0700 In: 72 [P.O.:237; IV Piggyback:100] Out: 271 [Urine:100; Stool:1]   Physical Exam:  Constitutional: alert, cooperative and no distress,she is tearful and wants to get better HENT: normocephalic without obvious abnormality  Eyes: PERRL, EOM's grossly intact and symmetric  Respiratory: breathingcertainly less labored, back on RA, suspect she is closer to  baseline Cardiovascular: regular rate, regular Gastrointestinal:obese,soft, non-tender, and non-distended, no rebound/guarding/peritonitis.JP in LLQ and drainage appears more serous Musculoskeletal:+ 1 pitting edema to lower extremities, HD catheter in right groin   Labs:  CBC Latest Ref Rng & Units 04/24/2020 04/23/2020 04/22/2020  WBC 4.0 - 10.5 K/uL 33.3(H) 40.1(H) 35.1(H)  Hemoglobin 12.0 - 15.0 g/dL 10.1(L) 11.1(L) 11.4(L)  Hematocrit 36 - 46 % 29.8(L) 31.1(L) 32.9(L)  Platelets 150 - 400 K/uL 179 194 130(L)   CMP Latest Ref Rng & Units 04/24/2020 04/23/2020 04/22/2020  Glucose 70 - 99 mg/dL 143(H) 186(H) 198(H)  BUN 6 - 20 mg/dL 95(H) 108(H) 95(H)  Creatinine 0.44 - 1.00 mg/dL 4.13(H) 5.00(H) 4.61(H)  Sodium 135 - 145 mmol/L 135 134(L) 135  Potassium 3.5 - 5.1 mmol/L 5.2(H) 5.2(H) 5.0  Chloride 98 - 111 mmol/L 97(L) 95(L) 96(L)  CO2 22 - 32 mmol/L 22 20(L) 19(L)  Calcium 8.9 - 10.3 mg/dL 7.9(L) 8.4(L) 7.9(L)  Total Protein 6.5 - 8.1 g/dL 5.0(L) 5.3(L) 5.3(L)  Total Bilirubin 0.3 - 1.2 mg/dL 1.0 1.2 0.7  Alkaline Phos 38 - 126 U/L 101 106 107  AST 15 - 41 U/L 36 32 40  ALT 0 - 44 U/L 60(H) 83(H) 113(H)     Imaging studies: No new pertinent imaging studies   Assessment/Plan: (ICD-10's: K58.20) 59 y.o. female with improvement in leukocytosis this morning which I do not suspect is coming from intra-abdominal source, and otherwise continues to maintain hemodynamics, admitted withseptic shock secondary tosigmoid diverticulitis with abscess s/p percutaneous drainage on 08/16andshe continues to bewithout any evidence of peritonitis/pneumoperitoneum/pneumatosis on examination or imaging,complicated by pertinent comorbidities includingmarked COPD.   - Okay to continue regular (or renal diet) diet - Continue  IV Abx(meropenem); ID following and appreciate assistance; Cx withpseudomonas, kleb andESBLe.coli --> new BCx drawn on 08/24 and are pending    - Monitor  abdominal examination; leukocytosis              - No emergent surgical intervention - Pain control prn; antiemetics prn - Maintain JP drain; record output - Appreciate nephrology/cardiology assistance and recommendations             - Continue to work with therapies; suspect she will need significant rehab - Further management per primary service; we will continue to follow    All of the above findings and recommendations were discussed with the patient, and the medical team, and all of patient's questions were answered to her expressed satisfaction.   -- Edison Simon, PA-C [facility] 04/24/2020, 9:05 AM [phone] M-F: 7am - 4pm

## 2020-04-24 NOTE — Progress Notes (Signed)
Date of Admission:  04/14/2020     ID: Melanie Bowen is a 59 y.o. female  Principal Problem:   Severe sepsis with septic shock (Melanie Bowen) Active Problems:   HTN (hypertension)   HIV (human immunodeficiency virus infection) (Melanie Bowen)   Diverticulitis of colon   Abscess of sigmoid colon due to diverticulitis   Acute renal failure (ARF) (Melanie Bowen)   Hypotension   Diverticulosis   Acute respiratory distress   DNR (do not resuscitate) discussion   Palliative care by specialist   Dyspnea   Subjective: Pt feeling better No pain abdomen Says breathing better  Medications:  . acidophilus  1 capsule Oral Daily  . alteplase  2 mg Intracatheter Once  . alteplase  2 mg Intracatheter Once  . amiodarone  400 mg Oral BID  . Chlorhexidine Gluconate Cloth  6 each Topical Daily  . docusate sodium  200 mg Oral BID  . dolutegravir  50 mg Oral Daily  . [START ON 04/25/2020] famotidine  40 mg Oral Q1200  . feeding supplement (NEPRO CARB STEADY)  237 mL Oral BID BM  . guaiFENesin-dextromethorphan  10 mL Oral Q8H  . heparin injection (subcutaneous)  5,000 Units Subcutaneous Q8H  . ipratropium-albuterol  3 mL Nebulization BID  . methylPREDNISolone (SOLU-MEDROL) injection  40 mg Intravenous Q24H  . mometasone-formoterol  2 puff Inhalation BID  . multivitamin  1 tablet Oral QHS  . pregabalin  25 mg Oral Daily  . [START ON 04/25/2020] rilpivirine  25 mg Oral Q breakfast  . sodium chloride flush  5 mL Intracatheter Q8H  . sodium zirconium cyclosilicate  10 g Oral Daily    Objective: Vital signs in last 24 hours: Temp:  [97.5 F (36.4 C)-98.8 F (37.1 C)] 98.4 F (36.9 C) (08/26 2102) Pulse Rate:  [98-154] 108 (08/26 2102) Resp:  [12-20] 20 (08/26 2102) BP: (127-141)/(68-76) 127/69 (08/26 2102) SpO2:  [94 %-99 %] 97 % (08/26 2102) Weight:  [133.5 kg] 133.5 kg (08/26 0455)  PHYSICAL EXAM:  General: Alert, cooperative, no distress, Lungs: b/l air entry Heart: Regular rate and rhythm, no murmur, rub  or gallop. Abdomen: Soft, non-tender,not distended. Drain present left lower quadrant Extremities: atraumatic, no cyanosis. No edema. No clubbing Skin: No rashes or lesions. Or bruising Lymph: Cervical, supraclavicular normal. Neurologic: Grossly non-focal  Lab Results Recent Labs    04/23/20 0545 04/24/20 0508  WBC 40.1* 33.3*  HGB 11.1* 10.1*  HCT 31.1* 29.8*  NA 134* 135  K 5.2* 5.2*  CL 95* 97*  CO2 20* 22  BUN 108* 95*  CREATININE 5.00* 4.13*   Liver Panel Recent Labs    04/23/20 0545 04/24/20 0508  PROT 5.3* 5.0*  ALBUMIN 2.1* 2.0*  AST 32 36  ALT 83* 60*  ALKPHOS 106 101  BILITOT 1.2 1.0    Microbiology: 04/14/20 BC NG, 04/22/20 BC NG 04/14/20 Abscess cuture Pseudomonas ESBL ecoli Klebsiella   Assessment/Plan: Septic shockresolved-secondary to perforated diverticulitisand peridiverticular abscess. pseudomonas, kleb andESBLe.coli in the culture.has JP drain inserted on 04/14/20.  Onmeropenem since 04/15/20- would continue for a minimum of 2 weeks and also make sure there is source control  Fungitell could be high due to Dialysis and also had risk for candida because of bowel perf- culture neg- received anidulafungin for 7 days   Repeat CT abdomen- abscess has resolved May need repeat CT abdomen if leucocytosis persisit even after removing catheter  Severe leucocytosis- recent spike likely related to rt femoral line as it was malfunctioning .  Line removed-on 03/2520- WBC down to ss from 41 Monitor- if leucocytosis persist repeat CT abdomen  Transaminitis due to shock liver resolved  AKI due to septic shock-getting dialysis  Thrombocytopenia-resolved    HIV- well controlled in jan 2021 her Vl <20 and cd4 >1000. She was getting care at [facility] and saw her provider last in June 2021  [place] hold. This has 4 different meds and we need to adjust the dose because of AKI. Other otion is to give a non NRTI based regime- can do  Dolutegravir + prezcobix ( darunavir+ cobi) VS dolutegravir+rilpavirine Discussed with her HIV provider at [place] count is 265 with 33% on 04/14/2020 and Vl <20 Prior to that in Jan 2021 Vl < 20 and cd4 1000  Will do Dolutegravir + rilpavirine as prezcobix regimen has interactions with steroids /amiodarone No PPI while on rilpavirine    COPD- ontaperingsteroids Discussed the management withher and her  care team  Dr.Fitzgerald will follow her from tomorrow

## 2020-04-24 NOTE — Progress Notes (Signed)
PT Cancellation Note  Patient Details Name: Melanie Bowen MRN: [id number] DOB: 1961/01/20   Cancelled Treatment:    Reason Eval/Treat Not Completed: Patient declined, no reason specified (Chart reviewed, treatment attempted. Pt reports in bed, awake, watching TV, eating some melon brought in by family, DTR at bedside.) Pt reports she is not ready to do PT yet, she didn't know PT was coming by so early, and she has not eaten yet. Author asked pt if she was able to order her meal and she replied that she is uninterested in eating 'any of that food.' Pt reports high motivation to attempt AMB this date, asks author to given a time of return, but Pryor Curia explains the nature of acute care precludes the ability to commit to a specific time. Will attempt again at later date/time as able.   11:21 AM, 04/24/20 Etta Grandchild, PT, DPT Physical Therapist - [facility]  [phone] ([place])     [place] [facility] 04/24/2020, 11:17 AM

## 2020-04-24 NOTE — Progress Notes (Signed)
Physical Therapy Treatment Patient Details Name: Melanie Bowen MRN: [id number] DOB: 1961/04/11 Today's Date: 04/24/2020    History of Present Illness Melanie Bowen is a 48yoF who comes to [facility] on 8/16 c LLQ ABD pain. Pt admitted c Acute sigmoid diverticulitis with diverticular abscess. PMH: COPD, asthma, HTN, diverticulosis, HIV, GERD, depression, bilat TKA, 3 level lumbar fusion, susequent lumbar hardware removal. Pt moved to ICU early in stay, inititated on HD on 04/16/20, temp cath removed 8/25. Pt  has had AF RVR issues. Pt also reports a subacute Rt rotator cuff injury with significant pain and disability, scheduled for repair with [name] in mid September.    PT Comments    Pt in bed upon entry, asleep, easily awakened, but remains somnolent for first 10 minutes of session. Bed level exercises commenced. Max-total assist for bed mobiliity. RVR upon sitting which remains in 140s-150s for 4-5 minutes prior to resolving. Hip extension remains too weak to safely attempt transfers at this time. Pt is also subjectively so weak that she is concerned with safety attempting. Pt tolerates sitting EOB x10 minutes, but has subsequent fatigue. RUE injury clarified with patient, has a subacute RC injury pending mid September surgery with [name] at [facility]. SpO2 WNL this session.    Follow Up Recommendations  CIR;Supervision for mobility/OOB;Supervision - Intermittent     Equipment Recommendations  Other (comment) (will not be able to use RUE for RW use due to injury)    Recommendations for Other Services       Precautions / Restrictions Precautions Precautions: Fall Restrictions Weight Bearing Restrictions: No    Mobility  Bed Mobility Overal bed mobility: Needs Assistance Bed Mobility: Supine to Sit;Sit to Supine;Rolling Rolling: Mod assist;+2 for safety/equipment   Supine to sit: Max assist Sit to supine: Total assist;+2 for safety/equipment   General bed mobility  comments: able to sit at EOB for ~ 10 minutes, has RVR in 140s-150s for 4-5 minutes upon sitting.  Transfers Overall transfer level:  (deferred, hip extension strength remains severely impaired;)                  Ambulation/Gait                 Stairs             Wheelchair Mobility    Modified Rankin (Stroke Patients Only)       Balance Overall balance assessment: Needs assistance Sitting-balance support: Single extremity supported;Feet supported Sitting balance-Leahy Scale: Poor                                      Cognition Arousal/Alertness: Awake/alert Behavior During Therapy: WFL for tasks assessed/performed Overall Cognitive Status: Within Functional Limits for tasks assessed                                        Exercises General Exercises - Lower Extremity Long Arc Quad: AROM;Both;15 reps;Seated Heel Slides: AAROM;Both;Supine;15 reps Hip ABduction/ADduction: AAROM;Both;10 reps;Supine    General Comments        Pertinent Vitals/Pain Pain Assessment: 0-10 Pain Score: 9  Pain Location: bilat ankles, foot dorsum, resolves with mobility in bed Pain Intervention(s): Limited activity within patient's tolerance;Monitored during session;Premedicated before session;Repositioned    Home Living  Prior Function            PT Goals (current goals can now be found in the care plan section) Acute Rehab PT Goals Patient Stated Goal: go home PT Goal Formulation: With patient Time For Goal Achievement: 05/06/20 Potential to Achieve Goals: Poor Progress towards PT goals: Progressing toward goals    Frequency    Min 2X/week      PT Plan Current plan remains appropriate    Co-evaluation              AM-PAC PT "6 Clicks" Mobility   Outcome Measure  Help needed turning from your back to your side while in a flat bed without using bedrails?: Total Help needed moving  from lying on your back to sitting on the side of a flat bed without using bedrails?: Total Help needed moving to and from a bed to a chair (including a wheelchair)?: Total Help needed standing up from a chair using your arms (e.g., wheelchair or bedside chair)?: Total Help needed to walk in hospital room?: Total Help needed climbing 3-5 steps with a railing? : Total 6 Click Score: 6    End of Session   Activity Tolerance: Patient tolerated treatment well;Patient limited by fatigue;Treatment limited secondary to medical complications (Comment) Patient left: in bed;with call bell/phone within reach;with bed alarm set Nurse Communication: Mobility status PT Visit Diagnosis: Unsteadiness on feet (R26.81);Other abnormalities of gait and mobility (R26.89);Muscle weakness (generalized) (M62.81);Difficulty in walking, not elsewhere classified (R26.2)     Time: 4383-8184 PT Time Calculation (min) (ACUTE ONLY): 35 min  Charges:  $Therapeutic Exercise: 23-37 mins                     4:17 PM, 04/24/20 Etta Grandchild, PT, DPT Physical Therapist - [facility]  [phone] ([place])     [place]iv[place] C 04/24/2020, 4:15 PM

## 2020-04-24 NOTE — Progress Notes (Signed)
PROGRESS NOTE    Melanie Bowen  XBJ:[id number] DOB: 08-23-1961 DOA: 04/14/2020 PCP: Theotis Burrow, MD   Assessment & Plan:   Principal Problem:   Severe sepsis with septic shock ([place]) Active Problems:   HTN (hypertension)   HIV (human immunodeficiency virus infection) ([place])   Diverticulitis of colon   Abscess of sigmoid colon due to diverticulitis   Acute renal failure (ARF) ([place])   Hypotension   Diverticulosis   Acute respiratory distress   DNR (do not resuscitate) discussion   Palliative care by specialist   Dyspnea   Septic shock: w/ end organ failure. Secondary to diverticulosis/abscess. Continue on IV merrem. Initially patient was on BiPAP in ICU which she continued to refuse & then placed on high flow and now currently on 3L [place]. S/p IR placement of gallbladder drain. No surgical intervention currently as per gen surg.   Acute renal failure: secondary to septic shock. Continue on HD as per nephro. Will need another cath for HD. Nephro recs apprec   Gallbladder abscess: fluid aerobic/anaerobic cultures shows klebsiella pneumonia, pseudomonas aeruginosa, e. coli. Continue on IV merrem as per ID   Acute sigmoid diverticulitis and diverticular abscess: s/p CT-guided drainage by IR on 8/16. Continue on IV abxs as per ID    Hyperkalemia: will be managed w/ HD.  Hypotension: resolved   Leukocytosis: significant. Continue on IV merrem as per ID. Possibly secondary to steroid use. Continue on steroid taper   HIV: well controlled CD4>1000 as per ID. Continue on antivirals. Management per ID   Lactic acidosis: resolved   Acute hypoxic respiratory failure: secondary to COPD exacerbation. Continue on steroid taper. Continue on bronchodilators. Encourage incentive spirometry. Weaned off of supplemental oxygen today.   A. Fib: w/ RVR. Continue on amiodarone. Unable to tolerate cardizem due to hypotension. Withholding anticoagulation at this time due to severe  infection. Cardio recs apprec   Acute liver failure: secondary to septic shock. Improving. AST is WNL, ALT is still elevated but trending down. Will continue to monitor   Morbid obesity: BMI 46.6. Complicates overall care and prognosis    Metabolic encephalopathy: likely secondary to septic shock, hypoxia. Will continue to monitor. Resolved    DVT prophylaxis: heparin Code Status: No intubation Family Communication:  Disposition Plan: likely d/c to CIR vs SNF   Consultants:   Surgery  ID   Procedures:    Antimicrobials: merrem    Subjective: Pt c/o malaise   Objective: Vitals:   04/23/20 1522 04/23/20 1955 04/24/20 0157 04/24/20 0455  BP: (!) 142/75 (!) 126/58  136/72  Pulse: 100 89  98  Resp: 18 20  18   Temp: 97.6 F (36.4 C) 98.7 F (37.1 C)  (!) 97.5 F (36.4 C)  TempSrc: Oral Oral  Oral  SpO2: 99% 98% 98% 99%  Weight:    133.5 kg  Height:        Intake/Output Summary (Last 24 hours) at 04/24/2020 0723 Last data filed at 04/24/2020 0100 Gross per 24 hour  Intake 337 ml  Output 271 ml  Net 66 ml   Filed Weights   04/14/20 1822 04/22/20 1649 04/24/20 0455  Weight: 121.6 kg 131 kg 133.5 kg    Examination:  General exam: Appears agitated  Respiratory system: decreased breath sounds b/l. No rales  Cardiovascular system: S1 & S2+. No rubs, gallops or clicks.  Gastrointestinal system: abdomen is obese, soft and nontender.  Normal bowel sounds heard. Central nervous system: Alert and oriented. Moves all 4  extremities Psychiatry: Judgement and insight appear normal. Agitated & frustrated.     Data Reviewed: I have personally reviewed following labs and imaging studies  CBC: Recent Labs  Lab 04/18/20 0442 04/19/20 0458 04/22/20 1112 04/23/20 0545 04/24/20 0508  WBC 24.8* 24.7* 35.1* 40.1* 33.3*  NEUTROABS  --   --  32.0*  --   --   HGB 12.0 11.5* 11.4* 11.1* 10.1*  HCT 32.4* 31.3* 32.9* 31.1* 29.8*  MCV 83.5 82.2 87.7 85.9 91.1  PLT 31*  36* 130* 194 409   Basic Metabolic Panel: Recent Labs  Lab 04/18/20 0442 04/19/20 0848 04/22/20 0436 04/22/20 1112 04/23/20 0545  NA 136 136  --  135 134*  K 4.8 5.5*  --  5.0 5.2*  CL 101 100  --  96* 95*  CO2 20* 18*  --  19* 20*  GLUCOSE 126* 143*  --  198* 186*  BUN 63* 61*  --  95* 108*  CREATININE 4.21* 3.89*  --  4.61* 5.00*  CALCIUM 7.3* 8.2*  --  7.9* 8.4*  MG 1.9 1.9 2.3  --  2.4  PHOS 5.9* 6.2* 8.5*  --  10.0*   GFR: Estimated Creatinine Clearance: 17.2 mL/min (A) (by C-G formula based on SCr of 5 mg/dL (H)). Liver Function Tests: Recent Labs  Lab 04/18/20 0442 04/19/20 0848 04/22/20 1112 04/23/20 0545  AST 247* 94* 40 32  ALT 746* 419* 113* 83*  ALKPHOS 120 113 107 106  BILITOT 0.7 1.2 0.7 1.2  PROT 5.5* 5.2* 5.3* 5.3*  ALBUMIN 2.0* 1.9* 2.1* 2.1*   No results for input(s): LIPASE, AMYLASE in the last 168 hours. No results for input(s): AMMONIA in the last 168 hours. Coagulation Profile: No results for input(s): INR, PROTIME in the last 168 hours. Cardiac Enzymes: No results for input(s): CKTOTAL, CKMB, CKMBINDEX, TROPONINI in the last 168 hours. BNP (last 3 results) No results for input(s): PROBNP in the last 8760 hours. HbA1C: No results for input(s): HGBA1C in the last 72 hours. CBG: No results for input(s): GLUCAP in the last 168 hours. Lipid Profile: No results for input(s): CHOL, HDL, LDLCALC, TRIG, CHOLHDL, LDLDIRECT in the last 72 hours. Thyroid Function Tests: No results for input(s): TSH, T4TOTAL, FREET4, T3FREE, THYROIDAB in the last 72 hours. Anemia Panel: No results for input(s): VITAMINB12, FOLATE, FERRITIN, TIBC, IRON, RETICCTPCT in the last 72 hours. Sepsis Labs: No results for input(s): PROCALCITON, LATICACIDVEN in the last 168 hours.  Recent Results (from the past 240 hour(s))  CULTURE, BLOOD (ROUTINE X 2) w Reflex to ID Panel     Status: None   Collection Time: 04/14/20  8:14 AM   Specimen: BLOOD  Result Value Ref Range  Status   Specimen Description BLOOD LEFT [place]  Final   Special Requests   Final    BOTTLES DRAWN AEROBIC AND ANAEROBIC Blood Culture adequate volume   Culture   Final    NO GROWTH 5 DAYS Performed at [facility], [address] [place], [place] [address]    Report Status 04/19/2020 FINAL  Final  CULTURE, BLOOD (ROUTINE X 2) w Reflex to ID Panel     Status: None   Collection Time: 04/14/20  8:14 AM   Specimen: BLOOD  Result Value Ref Range Status   Specimen Description BLOOD LEFT WRIST  Final   Special Requests   Final    BOTTLES DRAWN AEROBIC AND ANAEROBIC Blood Culture adequate volume   Culture   Final  NO GROWTH 5 DAYS Performed at [facility], [address] [place], [place] [address]    Report Status 04/19/2020 FINAL  Final  Aerobic/Anaerobic Culture (surgical/deep wound)     Status: None   Collection Time: 04/14/20 11:14 AM   Specimen: Abscess  Result Value Ref Range Status   Specimen Description   Final    ABSCESS Performed at [facility], [address] [place], [place] [address]    Special Requests   Final    NONE Performed at [facility], [place], [place] [address]    Gram Stain   Final    NO WBC SEEN ABUNDANT GRAM NEGATIVE RODS FEW GRAM POSITIVE COCCI FEW GRAM POSITIVE RODS Performed at [facility], [place] [address] [place], [place] [address]    Culture   Final    ABUNDANT KLEBSIELLA PNEUMONIAE ABUNDANT PSEUDOMONAS AERUGINOSA MODERATE ESCHERICHIA COLI Confirmed Extended Spectrum Beta-Lactamase Producer (ESBL).  In bloodstream infections from ESBL organisms, carbapenems are preferred over piperacillin/tazobactam. They are shown to have a lower risk of mortality. MIXED ANAEROBIC FLORA PRESENT.  CALL LAB IF FURTHER IID REQUIRED.    Report Status 04/20/2020 FINAL  Final   Organism ID, Bacteria KLEBSIELLA PNEUMONIAE  Final   Organism ID, Bacteria PSEUDOMONAS AERUGINOSA  Final   Organism ID,  Bacteria ESCHERICHIA COLI  Final      Susceptibility   Escherichia coli - MIC*    AMPICILLIN >=32 RESISTANT Resistant     CEFAZOLIN >=64 RESISTANT Resistant     CEFEPIME 16 RESISTANT Resistant     CEFTAZIDIME RESISTANT Resistant     CEFTRIAXONE >=64 RESISTANT Resistant     CIPROFLOXACIN >=4 RESISTANT Resistant     GENTAMICIN <=1 SENSITIVE Sensitive     IMIPENEM <=0.25 SENSITIVE Sensitive     TRIMETH/SULFA >=320 RESISTANT Resistant     AMPICILLIN/SULBACTAM >=32 RESISTANT Resistant     PIP/TAZO 8 SENSITIVE Sensitive     * MODERATE ESCHERICHIA COLI   Klebsiella pneumoniae - MIC*    AMPICILLIN >=32 RESISTANT Resistant     CEFAZOLIN <=4 SENSITIVE Sensitive     CEFEPIME <=0.12 SENSITIVE Sensitive     CEFTAZIDIME <=1 SENSITIVE Sensitive     CEFTRIAXONE <=0.25 SENSITIVE Sensitive     CIPROFLOXACIN <=0.25 SENSITIVE Sensitive     GENTAMICIN <=1 SENSITIVE Sensitive     IMIPENEM <=0.25 SENSITIVE Sensitive     TRIMETH/SULFA <=20 SENSITIVE Sensitive     AMPICILLIN/SULBACTAM 16 INTERMEDIATE Intermediate     PIP/TAZO 8 SENSITIVE Sensitive     * ABUNDANT KLEBSIELLA PNEUMONIAE   Pseudomonas aeruginosa - MIC*    CEFTAZIDIME 4 SENSITIVE Sensitive     CIPROFLOXACIN <=0.25 SENSITIVE Sensitive     GENTAMICIN 2 SENSITIVE Sensitive     IMIPENEM 2 SENSITIVE Sensitive     PIP/TAZO 8 SENSITIVE Sensitive     CEFEPIME 4 SENSITIVE Sensitive     * ABUNDANT PSEUDOMONAS AERUGINOSA  MRSA PCR Screening     Status: None   Collection Time: 04/14/20  6:19 PM   Specimen: Nasopharyngeal  Result Value Ref Range Status   MRSA by PCR NEGATIVE NEGATIVE Final    Comment:        The GeneXpert MRSA Assay (FDA approved for NASAL specimens only), is one component of a comprehensive MRSA colonization surveillance program. It is not intended to diagnose MRSA infection nor to guide or monitor treatment for MRSA infections. Performed at [facility], [address], [place], [place] [address]  Urine  Culture     Status: None   Collection Time: 04/15/20  4:28 AM   Specimen: Urine, Random  Result Value Ref Range Status   Specimen Description   Final    URINE, RANDOM Performed at [facility], [address], [place], [place] [address]    Special Requests   Final    NONE Performed at [facility], [address] [place], [place] [address]    Culture   Final    NO GROWTH Performed at [facility], [place] [address] [place], [place] [address]    Report Status 04/16/2020 FINAL  Final  CULTURE, BLOOD (ROUTINE X 2) w Reflex to ID Panel     Status: None (Preliminary result)   Collection Time: 04/22/20  6:12 PM   Specimen: BLOOD  Result Value Ref Range Status   Specimen Description BLOOD FOOT  Final   Special Requests   Final    BOTTLES DRAWN AEROBIC AND ANAEROBIC Blood Culture adequate volume   Culture   Final    NO GROWTH 2 DAYS Performed at [facility], [address] [place], [place] [address]    Report Status PENDING  Incomplete  CULTURE, BLOOD (ROUTINE X 2) w Reflex to ID Panel     Status: None (Preliminary result)   Collection Time: 04/22/20  6:17 PM   Specimen: BLOOD  Result Value Ref Range Status   Specimen Description BLOOD FOOT  Final   Special Requests   Final    BOTTLES DRAWN AEROBIC AND ANAEROBIC Blood Culture results may not be optimal due to an excessive volume of blood received in culture bottles   Culture   Final    NO GROWTH 2 DAYS Performed at [facility], [address], [place], [place] [address]    Report Status PENDING  Incomplete         Radiology Studies: US Venous Img Upper Uni Right(DVT)  Result Date: 04/23/2020 CLINICAL DATA:  Right upper extremity pain and swelling for 1 week EXAM: RIGHT UPPER EXTREMITY VENOUS DOPPLER ULTRASOUND TECHNIQUE: Gray-scale sonography with graded compression, as well as color Doppler and duplex ultrasound were performed to evaluate the upper extremity deep venous system  from the level of the subclavian vein and including the jugular, axillary, basilic, radial, ulnar and upper cephalic vein. Spectral Doppler was utilized to evaluate flow at rest and with distal augmentation maneuvers. COMPARISON:  None. FINDINGS: Contralateral Subclavian Vein: Respiratory phasicity is normal and symmetric with the symptomatic side. No evidence of thrombus. Normal compressibility. Internal Jugular Vein: No evidence of thrombus. Normal compressibility, respiratory phasicity and response to augmentation. Subclavian Vein: No evidence of thrombus. Normal compressibility, respiratory phasicity and response to augmentation. Axillary Vein: No evidence of thrombus. Normal compressibility, respiratory phasicity and response to augmentation. Cephalic Vein: No evidence of thrombus. Normal compressibility, respiratory phasicity and response to augmentation. Basilic Vein: No evidence of thrombus. Normal compressibility, respiratory phasicity and response to augmentation. Brachial Veins: No evidence of thrombus. Normal compressibility, respiratory phasicity and response to augmentation. Radial Veins: No evidence of thrombus. Normal compressibility, respiratory phasicity and response to augmentation. Ulnar Veins: No evidence of thrombus. Normal compressibility, respiratory phasicity and response to augmentation. IMPRESSION: No evidence of DVT within the right upper extremity. Electronically Signed   By: Jerilynn Mages.  Shick M.D.   On: 04/23/2020 07:43        Scheduled Meds: . acidophilus  1 capsule Oral Daily  . alteplase  2 mg Intracatheter Once  .  alteplase  2 mg Intracatheter Once  . amiodarone  400 mg Oral BID  . budesonide (PULMICORT) nebulizer solution  0.5 mg Nebulization BID  . Chlorhexidine Gluconate Cloth  6 each Topical Daily  . docusate sodium  200 mg Oral BID  . guaiFENesin-dextromethorphan  10 mL Oral Q8H  . heparin injection (subcutaneous)  5,000 Units Subcutaneous Q8H  . ipratropium-albuterol   3 mL Nebulization Q6H  . methylPREDNISolone (SOLU-MEDROL) injection  40 mg Intravenous Q12H  . mometasone-formoterol  2 puff Inhalation BID  . pantoprazole (PROTONIX) IV  40 mg Intravenous Daily  . pregabalin  25 mg Oral Daily  . sodium chloride flush  5 mL Intracatheter Q8H  . sodium zirconium cyclosilicate  10 g Oral Daily   Continuous Infusions: . sodium chloride 250 mL (04/23/20 2139)  . meropenem (MERREM) IV 500 mg (04/23/20 2145)     LOS: 10 days    Time spent: 30 mins    Wyvonnia Dusky, MD [facility] Pager [phone]  If 7PM-7AM, please contact night-coverage [email or web address] 04/24/2020, 7:23 AM '

## 2020-04-24 NOTE — Progress Notes (Signed)
Initial Nutrition Assessment  DOCUMENTATION CODES:   Morbid obesity  INTERVENTION:   Nepro Shake po BID, each supplement provides 425 kcal and 19 grams protein  Rena-vite daily   NUTRITION DIAGNOSIS:   Increased nutrient needs related to chronic illness (COPD, new HD) as evidenced by increased estimated needs.  GOAL:   Patient will meet greater than or equal to 90% of their needs  MONITOR:   PO intake, Supplement acceptance, Labs, Weight trends, Skin, I & O's  REASON FOR ASSESSMENT:   LOS    ASSESSMENT:   59 y/o female with h/o HIV, COPD and HTN who is admitted with septic shock secondary to perforated diverticulitis and peridiverticular abscess s/p JP drain on 04/14/20 and AKI requiring temporary HD  Pt with decreased appetite and oral intake in hospital; pt eating <50% of meals. RD will add supplements and rena-vite to help pt meet her estimated needs and replace losses from HD. Pt currently without HD access; plan is for permCath tomorrow. Per chart, pt up ~27lbs since admit.   Medications reviewed and include: risaquad, colace, solu-medrol, protonix, meropenem  Labs reviewed: K 5.2(H), BUN 95(H), creat 4.13(H), P 8.6(H), Mg 2.2 wnl Wbc- 33.3(H), Hgb 10.1(L), Hct 29.8(L)  NUTRITION - FOCUSED PHYSICAL EXAM: Unable to perform at this time   Diet Order:   Diet Order            Diet NPO time specified  Diet effective midnight           Diet renal with fluid restriction Fluid restriction: 1800 mL Fluid; Room service appropriate? Yes; Fluid consistency: Thin  Diet effective now                EDUCATION NEEDS:   No education needs have been identified at this time  Skin:  Skin Assessment: Reviewed RN Assessment (ecchymosis, MASD, incision abdomen)  Last BM:  8/25- TYPE 3  Height:   Ht Readings from Last 1 Encounters:  04/22/20 5\' 6"  (1.676 m)    Weight:   Wt Readings from Last 1 Encounters:  04/24/20 133.5 kg    Ideal Body Weight:  59 kg  BMI:   Body mass index is 47.5 kg/m.  Estimated Nutritional Needs:   Kcal:  2300-2600kcal/day  Protein:  >120g/day  Fluid:  UOP +1L  Koleen Distance MS, RD, LDN Please refer to [facility] for RD and/or RD on-call/weekend/after hours pager

## 2020-04-24 NOTE — Consult Note (Signed)
[facility] Vascular Consult Note  MRN : [id number]  Melanie Bowen is a 59 y.o. (08-15-1961) female who presents with chief complaint of  Chief Complaint  Patient presents with  . Abdominal Pain   History of Present Illness:  The patient is a 59 year old female with a past medical history of hypertension, HIV, diverticulitis / diverticulosis, asthma, degenerative disc disease, depression, anemia, active tobacco abuse who presented with abdominal pain admitted with septic shock secondary to sigmoid diverticulitis with abscess status post percutaneous drainage on 04/14/20.  Patient was previously being maintained via left femoral dialysis catheter however due to leukocytosis this was removed yesterday.  Patient has been afebrile with negative blood cultures.  Infectious disease has been following.  Patient's kidney function has not returned and nephrology would like to continue to dialyze at this time.  Vascular surgery was consulted by Dr. Holley Raring via Epic secure chat for placement of a PermCath.  Current Facility-Administered Medications  Medication Dose Route Frequency Provider Last Rate Last Admin  . 0.9 %  sodium chloride infusion  250 mL Intravenous Continuous Darel Hong D, NP 10 mL/hr at 04/23/20 2139 250 mL at 04/23/20 2139  . acetaminophen (TYLENOL) tablet 650 mg  650 mg Oral Q6H PRN Mansy, Jan A, MD   650 mg at 04/14/20 0451   Or  . acetaminophen (TYLENOL) suppository 650 mg  650 mg Rectal Q6H PRN Mansy, Jan A, MD      . acidophilus (RISAQUAD) capsule 1 capsule  1 capsule Oral Daily Shahmehdi, Seyed A, MD   1 capsule at 04/24/20 1040  . albuterol (PROVENTIL) (2.5 MG/3ML) 0.083% nebulizer solution 2.5 mg  2.5 mg Nebulization Q4H PRN Wyvonnia Dusky, MD      . ALPRAZolam Duanne Moron) tablet 1 mg  1 mg Oral TID PRN Deatra James, MD   1 mg at 04/23/20 2136  . alteplase (CATHFLO ACTIVASE) injection 2 mg  2 mg Intracatheter Once Lateef, Munsoor, MD       . alteplase (CATHFLO ACTIVASE) injection 2 mg  2 mg Intracatheter Once Lateef, Munsoor, MD      . amiodarone (PACERONE) tablet 400 mg  400 mg Oral BID Teodoro Spray, MD   400 mg at 04/24/20 1039  . bisacodyl (DULCOLAX) EC tablet 10 mg  10 mg Oral Daily PRN Wyvonnia Dusky, MD      . budesonide (PULMICORT) nebulizer solution 0.5 mg  0.5 mg Nebulization BID Flora Lipps, MD   0.5 mg at 04/24/20 0801  . Chlorhexidine Gluconate Cloth 2 % PADS 6 each  6 each Topical Daily Max Sane, MD   6 each at 04/20/20 1200  . darunavir-cobicistat (PREZCOBIX) 800-150 MG per tablet 1 tablet  1 tablet Oral Q breakfast Ravishankar, Jayashree, MD      . docusate sodium (COLACE) capsule 200 mg  200 mg Oral BID Wyvonnia Dusky, MD   200 mg at 04/24/20 1039  . dolutegravir (TIVICAY) tablet 50 mg  50 mg Oral Daily Ravishankar, Joellyn Quails, MD      . guaiFENesin-dextromethorphan (ROBITUSSIN DM) 100-10 MG/5ML syrup 10 mL  10 mL Oral Q8H Shahmehdi, Seyed A, MD   10 mL at 04/21/20 2246  . heparin injection 5,000 Units  5,000 Units Subcutaneous Q8H Wyvonnia Dusky, MD   5,000 Units at 04/24/20 0600  . heparin SCN flush for CVL/PCVC (NS + heparin 1 unit/ml)  0.5-1.7 mL Intravenous PRN Darel Hong D, NP   1.4 mL at 04/16/20 1717  .  HYDROcodone-acetaminophen (NORCO/VICODIN) 5-325 MG per tablet 1-2 tablet  1-2 tablet Oral Q6H PRN Bradly Bienenstock, NP   2 tablet at 04/24/20 1049  . HYDROmorphone (DILAUDID) injection 0.5-1 mg  0.5-1 mg Intravenous Q2H PRN Mansy, Jan A, MD   0.5 mg at 04/16/20 1308  . ipratropium-albuterol (DUONEB) 0.5-2.5 (3) MG/3ML nebulizer solution 3 mL  3 mL Nebulization BID Wyvonnia Dusky, MD      . labetalol (NORMODYNE) injection 20 mg  20 mg Intravenous Q3H PRN Mansy, Jan A, MD   20 mg at 04/17/20 1749  . magic mouthwash  5 mL Oral QID PRN Shahmehdi, Seyed A, MD       And  . lidocaine (XYLOCAINE) 2 % viscous mouth solution 15 mL  15 mL Mouth/Throat QID PRN Shahmehdi, Seyed A, MD      .  magnesium hydroxide (MILK OF MAGNESIA) suspension 30 mL  30 mL Oral Daily PRN Mansy, Jan A, MD   30 mL at 04/20/20 1728  . menthol-cetylpyridinium (CEPACOL) lozenge 3 mg  1 lozenge Oral PRN Lang Snow, NP   3 mg at 04/24/20 1059  . meropenem (MERREM) 500 mg in sodium chloride 0.9 % 100 mL IVPB  500 mg Intravenous Q24H Lu Duffel, RPH 200 mL/hr at 04/23/20 2145 500 mg at 04/23/20 2145  . methylPREDNISolone sodium succinate (SOLU-MEDROL) 40 mg/mL injection 40 mg  40 mg Intravenous Q24H Wyvonnia Dusky, MD      . mometasone-formoterol [facility]) 200-5 MCG/ACT inhaler 2 puff  2 puff Inhalation BID Mansy, Jan A, MD   2 puff at 04/24/20 1038  . pantoprazole (PROTONIX) injection 40 mg  40 mg Intravenous Daily Max Sane, MD   40 mg at 04/24/20 1039  . phenol (CHLORASEPTIC) mouth spray 1 spray  1 spray Mouth/Throat PRN Deatra James, MD   1 spray at 04/22/20 0031  . pregabalin (LYRICA) capsule 25 mg  25 mg Oral Daily Wyvonnia Dusky, MD   25 mg at 04/24/20 1039  . sodium chloride flush (NS) 0.9 % injection 5 mL  5 mL Intracatheter Q8H Markus Daft, MD   5 mL at 04/24/20 0600  . sodium zirconium cyclosilicate (LOKELMA) packet 10 g  10 g Oral Daily Murlean Iba, MD   10 g at 04/24/20 1040   Past Medical History:  Diagnosis Date  . Acute GI hemorrhage 12/2015   required transfusion  . Allergic rhinitis   . Arthritis    DDD  . Asthma   . Blepharitis   . Bronchitis   . Cigarette smoker   . Claustrophobia   . COPD (chronic obstructive pulmonary disease) ([place])   . Cough   . DDD (degenerative disc disease), lumbar 04/10/2014  . Depression   . Diverticulosis   . Ectopic pregnancy   . Eye irritation   . Genital herpes   . GERD (gastroesophageal reflux disease)   . Headache   . HIV (human immunodeficiency virus infection) ([place])   . Hypertension   . Paresthesia of hand, bilateral   . Shortness of breath dyspnea    at times due to asthma   Past Surgical History:   Procedure Laterality Date  . BACK SURGERY  12/2014   lumbar lam. Dr. Deri Fuelling  . BACK SURGERY  12/25/2012   Removal lumbosubarachnoid shunt system, L5-S1 TF ESI, Dr. Virl Axe Minchew  . back surgery may 2016     lumbar   . BUNIONECTOMY Bilateral    feet  . COLONOSCOPY  12/06/2014   rescheduled from 11/01/2014 due to poor prep  . DIAGNOSTIC LAPAROSCOPY    . DISTAL FEMUR OSTEOTOMY Right    Dr. Earnestine Leys  . ESOPHAGOGASTRODUODENOSCOPY Left 01/11/2016   Procedure: ESOPHAGOGASTRODUODENOSCOPY (EGD);  Surgeon: Hulen Luster, MD;  Location: [facility] ENDOSCOPY;  Service: Endoscopy;  Laterality: Left;  . Finger fracture Right    5th finger  . FRACTURE SURGERY Right 01/25/2014   closed reduction & percutaneous pinning of 5th digit  . HAND SURGERY Bilateral    carpal tunnel  . JOINT REPLACEMENT Bilateral    knees  . KNEE SURGERY Bilateral 2003,2007   total Joint  . LAPAROSCOPY FOR ECTOPIC PREGNANCY    . PERIPHERAL VASCULAR CATHETERIZATION N/A 01/12/2016   Procedure: Visceral Angiography;  Surgeon: Algernon Huxley, MD;  Location: [facility];  Service: Cardiovascular;  Laterality: N/A;  . PERIPHERAL VASCULAR CATHETERIZATION N/A 01/12/2016   Procedure: Visceral Artery Intervention;  Surgeon: Algernon Huxley, MD;  Location: [facility];  Service: Cardiovascular;  Laterality: N/A;  . REVISION TOTAL KNEE ARTHROPLASTY Right 12/31/2002   Dr. Marry Guan  . TUBAL LIGATION    . VIDEO BRONCHOSCOPY N/A 06/30/2015   Procedure: VIDEO BRONCHOSCOPY WITHOUT FLUORO;  Surgeon: Vilinda Boehringer, MD;  Location: [facility] ORS;  Service: Cardiopulmonary;  Laterality: N/A;   Social History Social History   Tobacco Use  . Smoking status: Current Some Day Smoker    Packs/day: 0.25    Years: 38.00    Pack years: 9.50    Types: Cigarettes  . Smokeless tobacco: Never Used  . Tobacco comment: 1 cig daily  Vaping Use  . Vaping Use: Never used  Substance Use Topics  . Alcohol use: Yes    Alcohol/week: 5.0 standard  drinks    Types: 5 Glasses of wine per week    Comment: occ  . Drug use: Not Currently   Family History Family History  Problem Relation Age of Onset  . Breast cancer Sister   Denies family history of peripheral artery disease, venous disease or renal disease.  Allergies  Allergen Reactions  . Acetaminophen Nausea And Vomiting  . Ibuprofen Other (See Comments)    Reports causes bleeding  . Gabapentin Rash  . Morphine Itching and Rash  . Morphine And Related Itching  . Zanaflex  [Tizanidine] Rash   REVIEW OF SYSTEMS (Negative unless checked)  Constitutional: [] Weight loss  [] Fever  [] Chills Cardiac: [] Chest pain   [] Chest pressure   [] Palpitations   [] Shortness of breath when laying flat   [] Shortness of breath at rest   [] Shortness of breath with exertion. Vascular:  [] Pain in legs with walking   [] Pain in legs at rest   [] Pain in legs when laying flat   [] Claudication   [] Pain in feet when walking  [] Pain in feet at rest  [] Pain in feet when laying flat   [] History of DVT   [] Phlebitis   [] Swelling in legs   [] Varicose veins   [] Non-healing ulcers Pulmonary:   [] Uses home oxygen   [] Productive cough   [] Hemoptysis   [] Wheeze  [x] COPD   [] Asthma Neurologic:  [] Dizziness  [] Blackouts   [] Seizures   [] History of stroke   [] History of TIA  [] Aphasia   [] Temporary blindness   [] Dysphagia   [] Weakness or numbness in arms   [] Weakness or numbness in legs Musculoskeletal:  [x] Arthritis   [] Joint swelling   [] Joint pain   [] Low back pain Hematologic:  [] Easy bruising  [] Easy bleeding   []   Hypercoagulable state   [] Anemic  [] Hepatitis Gastrointestinal:  [] Blood in stool   [] Vomiting blood  [] Gastroesophageal reflux/heartburn   [] Difficulty swallowing. Genitourinary:  [x] Chronic kidney disease   [] Difficult urination  [] Frequent urination  [] Burning with urination   [] Blood in urine Skin:  [] Rashes   [] Ulcers   [] Wounds Psychological:  [] History of anxiety   []  History of major  depression.  Physical Examination  Vitals:   04/24/20 0802 04/24/20 0821 04/24/20 1201 04/24/20 1435  BP:  138/76 (!) 141/74   Pulse: 98 98 (!) 103 (!) 154  Resp: 16 20 20    Temp:  98.1 F (36.7 C) 98.8 F (37.1 C)   TempSrc:  Oral Oral   SpO2: 96% 97% 96% 95%  Weight:      Height:       Body mass index is 47.5 kg/m. Gen:  WD/WN, NAD Head: [place]/AT, No temporalis wasting. Prominent temp pulse not noted. Ear/Nose/Throat: Hearing grossly intact, nares w/o erythema or drainage, oropharynx w/o Erythema/Exudate Eyes: Sclera non-icteric, conjunctiva clear Neck: Trachea midline.  No JVD.  Pulmonary:  Good air movement, respirations not labored, equal bilaterally.  Cardiac: RRR, normal S1, S2. Vascular:  Vessel Right Left  Radial Palpable Palpable  Ulnar Palpable Palpable  Brachial Palpable Palpable  Carotid Palpable, without bruit Palpable, without bruit  Aorta Not palpable N/A  Femoral Palpable Palpable  Popliteal Palpable Palpable  PT Palpable Palpable  DP Palpable Palpable   Gastrointestinal: soft, non-tender/non-distended. No guarding/reflex.  JP with serous drainage  Musculoskeletal: M/S 5/5 throughout.  Extremities without ischemic changes.  No deformity or atrophy. Mild edema. Neurologic: Sensation grossly intact in extremities.  Symmetrical.  Speech is fluent. Motor exam as listed above. Psychiatric: Judgment intact, Mood & affect appropriate for pt's clinical situation. Dermatologic: No rashes or ulcers noted.  No cellulitis or open wounds. Lymph : No Cervical, Axillary, or Inguinal lymphadenopathy.  CBC Lab Results  Component Value Date   WBC 33.3 (H) 04/24/2020   HGB 10.1 (L) 04/24/2020   HCT 29.8 (L) 04/24/2020   MCV 91.1 04/24/2020   PLT 179 04/24/2020   BMET    Component Value Date/Time   NA 135 04/24/2020 0508   NA 138 11/03/2017 1125   NA 139 12/21/2014 0710   K 5.2 (H) 04/24/2020 0508   K 3.6 12/21/2014 0710   CL 97 (L) 04/24/2020 0508   CL 108  12/21/2014 0710   CO2 22 04/24/2020 0508   CO2 24 12/21/2014 0710   GLUCOSE 143 (H) 04/24/2020 0508   GLUCOSE 85 12/21/2014 0710   BUN 95 (H) 04/24/2020 0508   BUN 13 11/03/2017 1125   BUN 16 12/21/2014 0710   CREATININE 4.13 (H) 04/24/2020 0508   CREATININE 0.96 12/21/2014 0710   CALCIUM 7.9 (L) 04/24/2020 0508   CALCIUM 8.4 (L) 12/21/2014 0710   GFRNONAA 11 (L) 04/24/2020 0508   GFRNONAA >60 12/21/2014 0710   GFRAA 13 (L) 04/24/2020 0508   GFRAA >60 12/21/2014 0710   Estimated Creatinine Clearance: 20.9 mL/min (A) (by C-G formula based on SCr of 4.13 mg/dL (H)).  COAG Lab Results  Component Value Date   INR 2.1 (H) 04/16/2020   INR 1.1 04/14/2020   INR 1.1 11/06/2019   Radiology CT ABDOMEN PELVIS WO CONTRAST  Result Date: 04/18/2020 CLINICAL DATA:  59 year old female with concern for abdominal abscess or infection. EXAM: CT ABDOMEN AND PELVIS WITHOUT CONTRAST TECHNIQUE: Multidetector CT imaging of the abdomen and pelvis was performed following the standard protocol without IV  contrast. COMPARISON:  CT abdomen pelvis dated 04/14/2020. FINDINGS: Evaluation of this exam is limited due to respiratory motion artifact. Lower chest: Partially visualized clusters of ground-glass opacity primarily involving the right lung base may represent areas of atelectasis but concerning for pneumonia. Clinical correlation is recommended. No intra-abdominal free air. There is a small free fluid within the pelvis. Hepatobiliary: The liver is unremarkable. No intrahepatic biliary ductal dilatation. High attenuating content within the gallbladder, likely vicarious excretion of contrast. Pancreas: Unremarkable. No pancreatic ductal dilatation or surrounding inflammatory changes. Spleen: Normal in size without focal abnormality. Adrenals/Urinary Tract: The adrenal glands unremarkable. Retained contrast from prior CT within the renal parenchyma consistent with delayed clearance and acute renal insufficiency.  There is striated renal parenchyma concerning for pyelonephritis. Correlation with urinalysis recommended. There is no hydronephrosis. The visualized ureters appear unremarkable. The urinary bladder is decompressed around a Foley catheter. Stomach/Bowel: There is distal colonic and sigmoid diverticulosis without active inflammatory changes. Several mildly dilated small bowel loops measuring up to 3.4 cm in caliber without a transition most likely an ileus. Oral contrast traverses into the cecum. The appendix is normal. Vascular/Lymphatic: Advanced aortoiliac atherosclerotic disease. The IVC is unremarkable. No portal venous gas. There is no adenopathy. Reproductive: The uterus is grossly unremarkable. Other: Diffuse edema of the pelvic floor with small free fluid. Overall decrease in the inflammatory changes and stranding compared to the prior CT. A pigtail drainage catheter is noted within the pelvis in similar position. No drainable fluid collection identified. Musculoskeletal: There is osteopenia with degenerative changes of the spine. L3-L5 disc spacer and posterior fusion. There is disc desiccation and vacuum phenomena at L5-S1. No acute osseous pathology. IMPRESSION: 1. Partially visualized right lung base atelectasis or pneumonia. Clinical correlation is recommended. 2. Retained contrast within the renal parenchyma concerning for acute renal insufficiency. There is heterogeneous and striated nephrogram which may represent pyelonephritis. Correlation with urinalysis recommended. 3. Mild ileus.  No definite obstruction. 4. Colonic diverticulosis. 5. Overall decrease in the inflammatory changes and stranding compared to the prior CT. A pigtail drainage catheter within the pelvis is similar position. No drainable fluid collection identified. 6. Aortic Atherosclerosis (ICD10-I70.0). Electronically Signed   By: Anner Crete M.D.   On: 04/18/2020 17:59   CT ABDOMEN PELVIS WO CONTRAST  Result Date:  04/14/2020 CLINICAL DATA:  Abdominal pain. Contained perforated acute diverticulitis. EXAM: CT ABDOMEN AND PELVIS WITHOUT CONTRAST TECHNIQUE: Multidetector CT imaging of the abdomen and pelvis was performed following the standard protocol without IV contrast. COMPARISON:  CT 04/14/2020, 01/31/2019 FINDINGS: Lower chest: Lung bases are clear. Hepatobiliary: No focal hepatic lesion. No biliary duct dilatation. Common bile duct is normal. Pancreas: Pancreas is normal. No ductal dilatation. No pancreatic inflammation. Spleen: Normal spleen Adrenals/urinary tract: Adrenal glands and kidneys are normal. The ureters and bladder normal. Stomach/Bowel: Stomach duodenum normal. Mildly thickened loops small bowel in the pelvis is favored secondary inflammation related to pelvic abscess. Appendix is partially imaged (image 61/2) appears normal. The ascending and transverse colon normal. There are diverticula descending colon and sigmoid colon. Diverticular disease is heavy in the proximal sigmoid colon. There is a thick-walled fluid collection with the an air-fluid level measuring 8.1 x 4.7 cm in the central anterior upper pelvis. This is adjacent to the sigmoid colon. There is a thin tract between the colon in the fluid collection on image 71/2. Similar pericolonic abscess seen on CT 01/17/2019. Findings most consistent with contained sigmoid perforation with abscess formation. No intraperitoneal free.  Minimal fluid  in the pelvis. Vascular/Lymphatic: Abdominal aorta is normal caliber with atherosclerotic calcification. There is no retroperitoneal or periportal lymphadenopathy. No pelvic lymphadenopathy. Reproductive: Post hysterectomy.  Adnexa unremarkable Other: Small volume free fluid the pelvis Musculoskeletal: Posterior lumbar fusion IMPRESSION: 1. No significant interval change in short interval follow-up. 2. Contained sigmoid colon perforation with abscess formation. Perforation presumed related to diverticulitis.  Findings similar to May 2020 with increased volume of contained perforation. 3. No intraperitoneal free air. 4. Secondary inflammation of the small bowel. Electronically Signed   By: Suzy Bouchard M.D.   On: 04/14/2020 08:08   DG Chest 1 View  Result Date: 04/14/2020 CLINICAL DATA:  Dyspnea EXAM: CHEST  1 VIEW COMPARISON:  11/06/2019 FINDINGS: Mildly low lung volumes. There is no edema, consolidation, effusion, or pneumothorax. Normal heart size and mediastinal contours. Artifact from EKG leads IMPRESSION: No evidence of acute disease. Electronically Signed   By: Monte Fantasia M.D.   On: 04/14/2020 07:17   DG Abd 1 View  Result Date: 04/14/2020 CLINICAL DATA:  Abdominal pain.  Pelvic abscess drainage today. EXAM: ABDOMEN - 1 VIEW COMPARISON:  CT abdomen pelvis from same day. FINDINGS: The bowel gas pattern is normal. No intraperitoneal free air peer no radio-opaque calculi or other significant radiographic abnormality are seen. No acute osseous abnormality. Prior lumbar fusion. IMPRESSION: 1. Negative. Electronically Signed   By: Titus Dubin M.D.   On: 04/14/2020 14:37   CT ABDOMEN PELVIS W CONTRAST  Result Date: 04/14/2020 CLINICAL DATA:  Peritonitis or perforation suspected Patient with history of perforated diverticulitis post percutaneous drain placement today. EXAM: CT ABDOMEN AND PELVIS WITH CONTRAST TECHNIQUE: Multidetector CT imaging of the abdomen and pelvis was performed using the standard protocol following bolus administration of intravenous contrast. CONTRAST:  13mL OMNIPAQUE IOHEXOL 300 MG/ML  SOLN COMPARISON:  Two prior abdominopelvic CT earlier this day, prior to drain placement. FINDINGS: Lower chest: Small pleural effusions are new from prior exam. Adjacent compressive atelectasis. Breathing motion artifact in the lung bases limits assessment. Hepatobiliary: Small amount of perihepatic fluid slightly increased from prior exam. This appears mildly complex. High-density contents  in the urinary bladder increasing likely vicarious excretion of IV contrast. There is no discrete focal hepatic abnormality allowing for motion. Pancreas: Motion artifact through the pancreas. No obvious inflammation. Spleen: Normal allowing for motion. Adrenals/Urinary Tract: No adrenal nodule. There is heterogeneous enhancement of both kidneys. No significant perinephric edema. Minimal excretion from both kidneys on delayed phase imaging. There is excreted IV contrast within the urinary bladder. Stomach/Bowel: New surgical drain within the pelvic fluid collection. No definite residual fluid. A drain is closely approximated to fecalized loops of small bowel in the pelvis. Motion artifact limits detailed bowel assessment. Similar appearance of scattered small bowel thickening from prior exam which is likely reactive. There is no evidence of obstruction. No bowel pneumatosis. Diverticulosis involving the colon. No new colonic inflammation or diverticulitis. Air-fluid level in the stomach with small amount of fluid in the distal esophagus. Vascular/Lymphatic: Aortic atherosclerosis. No aortic aneurysm. No evidence of portal venous or mesenteric gas. No bulky adenopathy. Reproductive: Uterus and bilateral adnexa are unremarkable. Other: Surgical drain within the pelvic fluid collection which appears completely decompressed. No definite residual collection. Generalized fat stranding involving the lower small bowel mesentery. Small amount of non organized free fluid in the dependent pelvis, tracking into the pericolic gutters and right upper quadrant. There is no definite free air. Musculoskeletal: Stable. Postsurgical change in the lower lumbar spine. IMPRESSION: 1. Interval  percutaneous drainage of pelvic abscess. No residual fluid collection. 2. Small amount of non organized free fluid in the pelvis, pericolic gutters, and perihepatic right upper quadrant, slight increase from pre drainage CT earlier today. No  evidence of new abscess. No free air or interval perforation. 3. Heterogeneous enhancement of both kidneys, nonspecific but can be seen with pyelonephritis. 4. Small pleural effusions are new from prior exam. Adjacent compressive atelectasis. Electronically Signed   By: Keith Rake M.D.   On: 04/14/2020 22:22   CT Abdomen Pelvis W Contrast  Result Date: 04/14/2020 CLINICAL DATA:  Lower abdominal pain EXAM: CT ABDOMEN AND PELVIS WITH CONTRAST TECHNIQUE: Multidetector CT imaging of the abdomen and pelvis was performed using the standard protocol following bolus administration of intravenous contrast. CONTRAST:  174mL OMNIPAQUE IOHEXOL 300 MG/ML  SOLN COMPARISON:  01/31/2019 FINDINGS: Lower chest: Lung bases demonstrate no acute consolidation or pleural effusion. Normal cardiac size. Hepatobiliary: No focal liver abnormality is seen. No gallstones, gallbladder wall thickening, or biliary dilatation. Pancreas: Unremarkable. No pancreatic ductal dilatation or surrounding inflammatory changes. Spleen: Normal in size without focal abnormality. Adrenals/Urinary Tract: Adrenal glands are unremarkable. Kidneys are normal, without renal calculi, focal lesion, or hydronephrosis. Bladder is unremarkable. Stomach/Bowel: The stomach is nonenlarged. No dilated small bowel. Mildly thickened loops of small bowel within the pelvis, likely reactive. Negative appendix. Sigmoid colon diverticular disease. Wall thickening and mild inflammatory change at the distal sigmoid colon consistent with diverticulitis. 7.5 x 4.4 cm gas and fluid collection within the anterior pelvis suspicious for contained perforation/abscess. Soft tissue inflammatory density extends from inflamed sigmoid colon to the gas and fluid collection, sagittal series 6, image number 96. Vascular/Lymphatic: Moderate aortic atherosclerosis. No aneurysm. No suspicious nodes. Reproductive: Uterus and bilateral adnexa are unremarkable. Other: No free air. Small free  fluid in the pelvis. Small amount of upper abdominal ascites. Generalized soft tissue stranding within the pelvis consistent with inflammatory process. Musculoskeletal: Postsurgical changes of the lumbar spine with surgical rod and fixating screws L3 through L5. No acute osseous abnormality IMPRESSION: 1. Findings consistent with acute sigmoid colon diverticulitis. 7.5 x 4.4 cm gas and fluid collection within the anterior pelvis suspicious for contained perforation/abscess, this appears to communicate with the thickened inflamed sigmoid colon. 2. Small amount of upper abdominal and pelvic ascites. 3. Thickened appearance of pelvic small bowel loops, likely reactive Aortic Atherosclerosis (ICD10-I70.0). Electronically Signed   By: Donavan Foil M.D.   On: 04/14/2020 01:27   US RENAL  Result Date: 04/16/2020 CLINICAL DATA:  Acute renal failure. EXAM: RENAL / URINARY TRACT ULTRASOUND COMPLETE COMPARISON:  CT abdomen and pelvis 04/14/2020 FINDINGS: Right Kidney: Renal measurements: 11.5 x 5.3 x 4.9 cm = volume: 158 mL. Echogenicity within normal limits. No mass or hydronephrosis visualized. Left Kidney: Renal measurements: 11.3 x 5.5 x 6.1 cm = volume: 197 mL. Echogenicity within normal limits. No mass or hydronephrosis visualized. Bladder: Decompressed by Foley catheter. Other: None. IMPRESSION: Negative renal ultrasound. Electronically Signed   By: [place] Bores M.D.   On: 04/16/2020 13:01   MR SHOULDER RIGHT WO CONTRAST  Result Date: 04/01/2020 CLINICAL DATA:  Right shoulder pain and limited range of motion. No specific injury. EXAM: MRI OF THE RIGHT SHOULDER WITHOUT CONTRAST TECHNIQUE: Multiplanar, multisequence MR imaging of the shoulder was performed. No intravenous contrast was administered. COMPARISON:  None. FINDINGS: Examination limited by patient motion. Rotator cuff: Full-thickness retracted tear of the supraspinatus tendon is noted. This involves the mid and posterior fibers. The tear is in  the  critical zone region. Maximum retraction is estimated at 22 mm and the tear is 15.5 mm wide. The anterior fibers are still intact and the subscapularis tendon is intact. The infraspinatus tendon is intact. Tendinopathy noted with interstitial tears. Muscles:  No significant findings. Biceps long head:  Intact. Acromioclavicular Joint: Moderate degenerative changes. Type 2 acromion. No significant lateral downsloping or undersurface spurring. Glenohumeral Joint: Moderate to advanced degenerative changes with areas of full or near full-thickness cartilage loss, joint space narrowing, spurring and subchondral cystic change. Labrum:  Labral degenerative changes without obvious tear. Bones: No acute bony findings. There appears to be a small defect in the glenoid with fluid tracking back into the subcoracoid space. Possible old fracture. Other: Expected fluid in the subacromial/subdeltoid bursa. IMPRESSION: 1. Full-thickness retracted tear of the supraspinatus tendon as described above. 2. Intact long head biceps tendon and grossly intact glenoid labrum. 3. Advanced glenohumeral joint degenerative changes. 4. Moderate AC joint degenerative changes but no other significant findings for bony impingement. Electronically Signed   By: Marijo Sanes M.D.   On: 04/01/2020 20:48   US Venous Img Upper Uni Right(DVT)  Result Date: 04/23/2020 CLINICAL DATA:  Right upper extremity pain and swelling for 1 week EXAM: RIGHT UPPER EXTREMITY VENOUS DOPPLER ULTRASOUND TECHNIQUE: Gray-scale sonography with graded compression, as well as color Doppler and duplex ultrasound were performed to evaluate the upper extremity deep venous system from the level of the subclavian vein and including the jugular, axillary, basilic, radial, ulnar and upper cephalic vein. Spectral Doppler was utilized to evaluate flow at rest and with distal augmentation maneuvers. COMPARISON:  None. FINDINGS: Contralateral Subclavian Vein: Respiratory phasicity is  normal and symmetric with the symptomatic side. No evidence of thrombus. Normal compressibility. Internal Jugular Vein: No evidence of thrombus. Normal compressibility, respiratory phasicity and response to augmentation. Subclavian Vein: No evidence of thrombus. Normal compressibility, respiratory phasicity and response to augmentation. Axillary Vein: No evidence of thrombus. Normal compressibility, respiratory phasicity and response to augmentation. Cephalic Vein: No evidence of thrombus. Normal compressibility, respiratory phasicity and response to augmentation. Basilic Vein: No evidence of thrombus. Normal compressibility, respiratory phasicity and response to augmentation. Brachial Veins: No evidence of thrombus. Normal compressibility, respiratory phasicity and response to augmentation. Radial Veins: No evidence of thrombus. Normal compressibility, respiratory phasicity and response to augmentation. Ulnar Veins: No evidence of thrombus. Normal compressibility, respiratory phasicity and response to augmentation. IMPRESSION: No evidence of DVT within the right upper extremity. Electronically Signed   By: Jerilynn Mages.  Shick M.D.   On: 04/23/2020 07:43   DG Abd Portable 2V  Result Date: 04/15/2020 CLINICAL DATA:  Diverticulitis.  Abdominal pain and distention. EXAM: PORTABLE ABDOMEN - 2 VIEW COMPARISON:  CT 04/14/2020. FINDINGS: Pelvic drainage catheter again noted. No bowel distention. Stool noted throughout the colon. No free air. Bibasilar atelectasis. Degenerative change lumbar spine and both hips. Contrast in the bladder from prior CT. IMPRESSION: 1.  Pelvic drainage catheter in stable position. 2. No acute abdominal abnormality identified. No bowel distention or free air. 3.  Bibasilar atelectasis. Electronically Signed   By: Marcello Moores  Register   On: 04/15/2020 08:07   CT IMAGE GUIDED DRAINAGE PERCUT CATH  PERITONEAL RETROPERIT  Result Date: 04/14/2020 INDICATION: 59 year old with air-fluid collection in the  lower abdomen / upper pelvic region. Findings are suggestive for a colonic diverticular abscess. Patient needs CT-guided drainage. EXAM: CT-GUIDED DRAINAGE OF PELVIC ABSCESS MEDICATIONS: Fentanyl 25 mcg ANESTHESIA/SEDATION: The patient was continuously monitored during the procedure by the interventional  radiology nurse under my direct supervision. COMPLICATIONS: None immediate. PROCEDURE: Informed written consent was obtained from the patient after a thorough discussion of the procedural risks, benefits and alternatives. All questions were addressed. A timeout was performed prior to the initiation of the procedure. Patient was placed supine on the CT scanner. Images through the lower abdomen and pelvis were performed. Air-fluid collection in the lower abdomen/pelvic region was identified and targeted for drainage. The anterior abdomen was prepped with chlorhexidine and sterile field was created. Skin and soft tissues were anesthetized using 1% lidocaine. A small incision was made. Using CT guidance, a 18 gauge trocar needle was directed into the air-fluid collection. Gray purulent fluid was coming out of the needle. Super stiff Amplatz wire was advanced into the collection and follow up CT images were obtained. The drain was dilated to accommodate a 12 [place] multipurpose drain. 12 French drain was placed. 40 mL gray colored purulent fluid and gas was removed from the collection. Follow up CT images were obtained. Catheter was sutured to skin and attached to a suction bulb. FINDINGS: Air-fluid collection just cephalad to the urinary bladder. Drain was successfully placed within the collection and 40 mL of purulent fluid was removed. The abscess was decompressed at the end of the procedure. IMPRESSION: CT-guided placement of a drainage catheter within the lower abdomen/pelvic abscess. Electronically Signed   By: Markus Daft M.D.   On: 04/14/2020 11:48   Assessment/Plan The patient is a 59 year old female with a  past medical history of hypertension, HIV, diverticulitis / diverticulosis, asthma, degenerative disc disease, depression, anemia, active tobacco abuse who presented with abdominal pain admitted with septic shock secondary to sigmoid diverticulitis with abscess status post percutaneous drainage on 04/14/20.  1.  Acute Kidney Failure: Patient was maintained previously by a right femoral temporary dialysis catheter which was removed yesterday in the setting of leukocytosis/sepsis.  Patient is afebrile with negative blood cultures.  Unfortunately, the patient's renal function has not returned and dialysis will need to continue however the patient does not have an adequate access at this time.  Recommend placing a PermCath to allow the patient to dialyze in the inpatient outpatient setting.  Procedure, risks and benefits were explained to the patient.  We will plan on PermCath insertion tomorrow with Dr. [name]  2. Acute hypoxic respiratory failure: Patient weaned off supplemental oxygen today Will use caution with sedation during PermCath insertion  3. Leukocytosis: Slightly improved today Afebrile, blood cultures negative  Discussed with Dr. Mayme Genta, PA-C  04/24/2020 2:48 PM  This note was created with Dragon medical transcription system.  Any error is purely unintentional.

## 2020-04-24 NOTE — Progress Notes (Signed)
Inpatient Rehabilitation Admissions Coordinator  Noted femoral dialysis catheter removed. Patient declined working with therapist this morning. I await further assessment of functional needs to assist with planning dispo when appropriate.  Danne Baxter, RN, MSN Rehab Admissions Coordinator [phone] 04/24/2020 12:38 PM

## 2020-04-24 NOTE — Progress Notes (Signed)
[facility]  ROUNDING NOTE   Subjective:  Temporary dialysis catheter removed yesterday. Remains oliguric. Creatinine currently 4.13.   Objective:  Vital signs in last 24 hours:  Temp:  [97.5 F (36.4 C)-98.7 F (37.1 C)] 98.1 F (36.7 C) (08/26 0821) Pulse Rate:  [89-114] 98 (08/26 0821) Resp:  [8-20] 20 (08/26 0821) BP: (99-142)/(55-79) 138/76 (08/26 0821) SpO2:  [96 %-99 %] 97 % (08/26 0821) Weight:  [133.5 kg] 133.5 kg (08/26 0455)  Weight change: 2.501 kg Filed Weights   04/14/20 1822 04/22/20 1649 04/24/20 0455  Weight: 121.6 kg 131 kg 133.5 kg    Intake/Output: I/O last 3 completed shifts: In: 337 [P.O.:237; IV Piggyback:100] Out: 469 [Urine:300; Other:170; Stool:1]   Intake/Output this shift:  No intake/output data recorded.  Physical Exam: General:  Awake,alert, in no acute distress  Head:  Normocephalic, atraumatic.hearing intact  Eyes:  Sclerae and conjunctivae clear  Neck:  Supple  Lungs:   Clear bilateral, normal effort  Heart:  Regular rate and rhythm  Abdomen:   Soft,non tender, bowel sounds present  Extremities:  No peripheral edema.  Neurologic:  Awake, alert, oriented  Skin:  No lesions or rashes  Access:  Left femoral dialysis catheter removed    Basic Metabolic Panel: Recent Labs  Lab 04/18/20 0442 04/18/20 0442 04/19/20 0848 04/19/20 0848 04/22/20 0436 04/22/20 1112 04/23/20 0545 04/24/20 0508  NA 136  --  136  --   --  135 134* 135  K 4.8  --  5.5*  --   --  5.0 5.2* 5.2*  CL 101  --  100  --   --  96* 95* 97*  CO2 20*  --  18*  --   --  19* 20* 22  GLUCOSE 126*  --  143*  --   --  198* 186* 143*  BUN 63*  --  61*  --   --  95* 108* 95*  CREATININE 4.21*  --  3.89*  --   --  4.61* 5.00* 4.13*  CALCIUM 7.3*   < > 8.2*   < >  --  7.9* 8.4* 7.9*  MG 1.9  --  1.9  --  2.3  --  2.4 2.2  PHOS 5.9*  --  6.2*  --  8.5*  --  10.0* 8.6*   < > = values in this interval not displayed.    Liver Function Tests: Recent  Labs  Lab 04/18/20 0442 04/19/20 0848 04/22/20 1112 04/23/20 0545 04/24/20 0508  AST 247* 94* 40 32 36  ALT 746* 419* 113* 83* 60*  ALKPHOS 120 113 107 106 101  BILITOT 0.7 1.2 0.7 1.2 1.0  PROT 5.5* 5.2* 5.3* 5.3* 5.0*  ALBUMIN 2.0* 1.9* 2.1* 2.1* 2.0*   No results for input(s): LIPASE, AMYLASE in the last 168 hours. No results for input(s): AMMONIA in the last 168 hours.  CBC: Recent Labs  Lab 04/18/20 0442 04/19/20 0458 04/22/20 1112 04/23/20 0545 04/24/20 0508  WBC 24.8* 24.7* 35.1* 40.1* 33.3*  NEUTROABS  --   --  32.0*  --   --   HGB 12.0 11.5* 11.4* 11.1* 10.1*  HCT 32.4* 31.3* 32.9* 31.1* 29.8*  MCV 83.5 82.2 87.7 85.9 91.1  PLT 31* 36* 130* 194 179    Cardiac Enzymes: No results for input(s): CKTOTAL, CKMB, CKMBINDEX, TROPONINI in the last 168 hours.  BNP: Invalid input(s): POCBNP  CBG: No results for input(s): GLUCAP in the last 168 hours.  Microbiology: Results  for orders placed or performed during the hospital encounter of 04/14/20  SARS Coronavirus 2 by RT PCR (hospital order, performed in [facility] hospital lab) Nasopharyngeal Nasopharyngeal Swab     Status: None   Collection Time: 04/14/20  2:17 AM   Specimen: Nasopharyngeal Swab  Result Value Ref Range Status   SARS Coronavirus 2 NEGATIVE NEGATIVE Final    Comment: (NOTE) SARS-CoV-2 target nucleic acids are NOT DETECTED.  The SARS-CoV-2 RNA is generally detectable in upper and lower respiratory specimens during the acute phase of infection. The lowest concentration of SARS-CoV-2 viral copies this assay can detect is 250 copies / mL. A negative result does not preclude SARS-CoV-2 infection and should not be used as the sole basis for treatment or other patient management decisions.  A negative result may occur with improper specimen collection / handling, submission of specimen other than nasopharyngeal swab, presence of viral mutation(s) within the areas targeted by this assay, and  inadequate number of viral copies (<250 copies / mL). A negative result must be combined with clinical observations, patient history, and epidemiological information.  Fact Sheet for Patients:   StrictlyIdeas.no  Fact Sheet for Healthcare Providers: BankingDealers.co.za  This test is not yet approved or  cleared by the [place] FDA and has been authorized for detection and/or diagnosis of SARS-CoV-2 by FDA under an Emergency Use Authorization (EUA).  This EUA will remain in effect (meaning this test can be used) for the duration of the COVID-19 declaration under Section 564(b)(1) of the Act, 21 U.S.C. section 360bbb-3(b)(1), unless the authorization is terminated or revoked sooner.  Performed at [facility], [address] [place], [place] [address]   CULTURE, BLOOD (ROUTINE X 2) w Reflex to ID Panel     Status: None   Collection Time: 04/14/20  8:14 AM   Specimen: BLOOD  Result Value Ref Range Status   Specimen Description BLOOD LEFT [facility]  Final   Special Requests   Final    BOTTLES DRAWN AEROBIC AND ANAEROBIC Blood Culture adequate volume   Culture   Final    NO GROWTH 5 DAYS Performed at [facility], [address] [place], [place] [address]    Report Status 04/19/2020 FINAL  Final  CULTURE, BLOOD (ROUTINE X 2) w Reflex to ID Panel     Status: None   Collection Time: 04/14/20  8:14 AM   Specimen: BLOOD  Result Value Ref Range Status   Specimen Description BLOOD LEFT WRIST  Final   Special Requests   Final    BOTTLES DRAWN AEROBIC AND ANAEROBIC Blood Culture adequate volume   Culture   Final    NO GROWTH 5 DAYS Performed at [facility], [address], [place], [place] [address]    Report Status 04/19/2020 FINAL  Final  Aerobic/Anaerobic Culture (surgical/deep wound)     Status: None   Collection Time: 04/14/20 11:14 AM   Specimen: Abscess  Result Value Ref Range Status   Specimen  Description   Final    ABSCESS Performed at [facility], [address], [place], [place] [address]    Special Requests   Final    NONE Performed at [facility], [place], [place] [address]    Gram Stain   Final    NO WBC SEEN ABUNDANT GRAM NEGATIVE RODS FEW GRAM POSITIVE COCCI FEW GRAM POSITIVE RODS Performed at [facility], [place] [address], [place], [place] [address]  Culture   Final    ABUNDANT KLEBSIELLA PNEUMONIAE ABUNDANT PSEUDOMONAS AERUGINOSA MODERATE ESCHERICHIA COLI Confirmed Extended Spectrum Beta-Lactamase Producer (ESBL).  In bloodstream infections from ESBL organisms, carbapenems are preferred over piperacillin/tazobactam. They are shown to have a lower risk of mortality. MIXED ANAEROBIC FLORA PRESENT.  CALL LAB IF FURTHER IID REQUIRED.    Report Status 04/20/2020 FINAL  Final   Organism ID, Bacteria KLEBSIELLA PNEUMONIAE  Final   Organism ID, Bacteria PSEUDOMONAS AERUGINOSA  Final   Organism ID, Bacteria ESCHERICHIA COLI  Final      Susceptibility   Escherichia coli - MIC*    AMPICILLIN >=32 RESISTANT Resistant     CEFAZOLIN >=64 RESISTANT Resistant     CEFEPIME 16 RESISTANT Resistant     CEFTAZIDIME RESISTANT Resistant     CEFTRIAXONE >=64 RESISTANT Resistant     CIPROFLOXACIN >=4 RESISTANT Resistant     GENTAMICIN <=1 SENSITIVE Sensitive     IMIPENEM <=0.25 SENSITIVE Sensitive     TRIMETH/SULFA >=320 RESISTANT Resistant     AMPICILLIN/SULBACTAM >=32 RESISTANT Resistant     PIP/TAZO 8 SENSITIVE Sensitive     * MODERATE ESCHERICHIA COLI   Klebsiella pneumoniae - MIC*    AMPICILLIN >=32 RESISTANT Resistant     CEFAZOLIN <=4 SENSITIVE Sensitive     CEFEPIME <=0.12 SENSITIVE Sensitive     CEFTAZIDIME <=1 SENSITIVE Sensitive     CEFTRIAXONE <=0.25 SENSITIVE Sensitive     CIPROFLOXACIN <=0.25 SENSITIVE Sensitive     GENTAMICIN <=1 SENSITIVE Sensitive     IMIPENEM <=0.25 SENSITIVE Sensitive     TRIMETH/SULFA <=20  SENSITIVE Sensitive     AMPICILLIN/SULBACTAM 16 INTERMEDIATE Intermediate     PIP/TAZO 8 SENSITIVE Sensitive     * ABUNDANT KLEBSIELLA PNEUMONIAE   Pseudomonas aeruginosa - MIC*    CEFTAZIDIME 4 SENSITIVE Sensitive     CIPROFLOXACIN <=0.25 SENSITIVE Sensitive     GENTAMICIN 2 SENSITIVE Sensitive     IMIPENEM 2 SENSITIVE Sensitive     PIP/TAZO 8 SENSITIVE Sensitive     CEFEPIME 4 SENSITIVE Sensitive     * ABUNDANT PSEUDOMONAS AERUGINOSA  MRSA PCR Screening     Status: None   Collection Time: 04/14/20  6:19 PM   Specimen: Nasopharyngeal  Result Value Ref Range Status   MRSA by PCR NEGATIVE NEGATIVE Final    Comment:        The GeneXpert MRSA Assay (FDA approved for NASAL specimens only), is one component of a comprehensive MRSA colonization surveillance program. It is not intended to diagnose MRSA infection nor to guide or monitor treatment for MRSA infections. Performed at [facility], [address], [place], [place] [address]   Urine Culture     Status: None   Collection Time: 04/15/20  4:28 AM   Specimen: Urine, Random  Result Value Ref Range Status   Specimen Description   Final    URINE, RANDOM Performed at [facility], [address] [place], [place] [address]    Special Requests   Final    NONE Performed at [facility], [address] [place], [place] [address]    Culture   Final    NO GROWTH Performed at [facility], [place] [address] [place], [place] [address]    Report Status 04/16/2020 FINAL  Final  CULTURE, BLOOD (ROUTINE X 2) w Reflex to ID Panel     Status: None (Preliminary result)   Collection Time: 04/22/20  6:12 PM   Specimen: BLOOD  Result  Value Ref Range Status   Specimen Description BLOOD FOOT  Final   Special Requests   Final    BOTTLES DRAWN AEROBIC AND ANAEROBIC Blood Culture adequate volume   Culture   Final    NO GROWTH 2 DAYS Performed at [facility], [address], [place],  [place] [address]    Report Status PENDING  Incomplete  CULTURE, BLOOD (ROUTINE X 2) w Reflex to ID Panel     Status: None (Preliminary result)   Collection Time: 04/22/20  6:17 PM   Specimen: BLOOD  Result Value Ref Range Status   Specimen Description BLOOD FOOT  Final   Special Requests   Final    BOTTLES DRAWN AEROBIC AND ANAEROBIC Blood Culture results may not be optimal due to an excessive volume of blood received in culture bottles   Culture   Final    NO GROWTH 2 DAYS Performed at [facility], [address] [place], [place] [address]    Report Status PENDING  Incomplete    Coagulation Studies: No results for input(s): LABPROT, INR in the last 72 hours.  Urinalysis: No results for input(s): COLORURINE, LABSPEC, PHURINE, GLUCOSEU, HGBUR, BILIRUBINUR, KETONESUR, PROTEINUR, UROBILINOGEN, NITRITE, LEUKOCYTESUR in the last 72 hours.  Invalid input(s): APPERANCEUR    Imaging: US Venous Img Upper Uni Right(DVT)  Result Date: 04/23/2020 CLINICAL DATA:  Right upper extremity pain and swelling for 1 week EXAM: RIGHT UPPER EXTREMITY VENOUS DOPPLER ULTRASOUND TECHNIQUE: Gray-scale sonography with graded compression, as well as color Doppler and duplex ultrasound were performed to evaluate the upper extremity deep venous system from the level of the subclavian vein and including the jugular, axillary, basilic, radial, ulnar and upper cephalic vein. Spectral Doppler was utilized to evaluate flow at rest and with distal augmentation maneuvers. COMPARISON:  None. FINDINGS: Contralateral Subclavian Vein: Respiratory phasicity is normal and symmetric with the symptomatic side. No evidence of thrombus. Normal compressibility. Internal Jugular Vein: No evidence of thrombus. Normal compressibility, respiratory phasicity and response to augmentation. Subclavian Vein: No evidence of thrombus. Normal compressibility, respiratory phasicity and response to augmentation. Axillary Vein: No evidence of  thrombus. Normal compressibility, respiratory phasicity and response to augmentation. Cephalic Vein: No evidence of thrombus. Normal compressibility, respiratory phasicity and response to augmentation. Basilic Vein: No evidence of thrombus. Normal compressibility, respiratory phasicity and response to augmentation. Brachial Veins: No evidence of thrombus. Normal compressibility, respiratory phasicity and response to augmentation. Radial Veins: No evidence of thrombus. Normal compressibility, respiratory phasicity and response to augmentation. Ulnar Veins: No evidence of thrombus. Normal compressibility, respiratory phasicity and response to augmentation. IMPRESSION: No evidence of DVT within the right upper extremity. Electronically Signed   By: Jerilynn Mages.  Shick M.D.   On: 04/23/2020 07:43     Medications:   . sodium chloride 250 mL (04/23/20 2139)  . meropenem (MERREM) IV 500 mg (04/23/20 2145)   . acidophilus  1 capsule Oral Daily  . alteplase  2 mg Intracatheter Once  . alteplase  2 mg Intracatheter Once  . amiodarone  400 mg Oral BID  . budesonide (PULMICORT) nebulizer solution  0.5 mg Nebulization BID  . Chlorhexidine Gluconate Cloth  6 each Topical Daily  . docusate sodium  200 mg Oral BID  . guaiFENesin-dextromethorphan  10 mL Oral Q8H  . heparin injection (subcutaneous)  5,000 Units Subcutaneous Q8H  . ipratropium-albuterol  3 mL Nebulization Q6H  . methylPREDNISolone (SOLU-MEDROL) injection  40 mg Intravenous Q24H  . mometasone-formoterol  2 puff Inhalation BID  .  pantoprazole (PROTONIX) IV  40 mg Intravenous Daily  . pregabalin  25 mg Oral Daily  . sodium chloride flush  5 mL Intracatheter Q8H  . sodium zirconium cyclosilicate  10 g Oral Daily   acetaminophen **OR** acetaminophen, ALPRAZolam, bisacodyl, heparin NICU/SCN flush, HYDROcodone-acetaminophen, HYDROmorphone (DILAUDID) injection, ipratropium-albuterol, labetalol, magic mouthwash **AND** lidocaine, magnesium hydroxide,  menthol-cetylpyridinium, phenol  Assessment/ Plan:  59 y.o. female with past medical history of HIV, COPD, hypertension who was admitted with shortness of breath, colonic diverticular abscess, diverticulitis and subsequently found to have acute kidney injury.  1.  Acute kidney injury secondary to ATN with multiple factors from severe sepsis and multiple IV contrast exposures.   Very minimal urine output noted at the moment.  Creatinine currently 4.13.  Suspect that she will need another dialysis catheter placed tomorrow.  We will request PermCath placement by vascular surgery.  2.  Hyperkalemia.   Potassium still slightly high at 5.2.  Continue to monitor.  3.  Metabolic acidosis. Improved post dialysis.  Serum bicarbonate currently 22.  Continue to monitor.    LOS: 10 Melanie Bowen 8/26/202111:16 AM

## 2020-04-25 ENCOUNTER — Encounter: Payer: Self-pay | Admitting: Family Medicine

## 2020-04-25 ENCOUNTER — Encounter
Admission: EM | Disposition: A | Discharge status: Skilled Nursing Facility | Payer: Self-pay | Source: Home / Self Care | Attending: Internal Medicine

## 2020-04-25 DIAGNOSIS — N185 Chronic kidney disease, stage 5: Secondary | ICD-10-CM

## 2020-04-25 HISTORY — PX: DIALYSIS/PERMA CATHETER INSERTION: CATH118288

## 2020-04-25 LAB — PHOSPHORUS
Phosphorus: 9.1 mg/dL — ABNORMAL HIGH (ref 2.5–4.6)
Phosphorus: 8.9 mg/dL — ABNORMAL HIGH (ref 2.5–4.6)

## 2020-04-25 LAB — CBC
HCT: 30.9 % — ABNORMAL LOW (ref 36.0–46.0)
Hemoglobin: 10.5 g/dL — ABNORMAL LOW (ref 12.0–15.0)
MCH: 30.4 pg (ref 26.0–34.0)
MCHC: 34 g/dL (ref 30.0–36.0)
MCV: 89.6 fL (ref 80.0–100.0)
Platelets: 206 10*3/uL (ref 150–400)
RBC: 3.45 MIL/uL — ABNORMAL LOW (ref 3.87–5.11)
RDW: 19 % — ABNORMAL HIGH (ref 11.5–15.5)
WBC: 34.1 10*3/uL — ABNORMAL HIGH (ref 4.0–10.5)
nRBC: 0 % (ref 0.0–0.2)

## 2020-04-25 LAB — COMPREHENSIVE METABOLIC PANEL
ALT: 44 U/L (ref 0–44)
AST: 36 U/L (ref 15–41)
Albumin: 1.9 g/dL — ABNORMAL LOW (ref 3.5–5.0)
Alkaline Phosphatase: 100 U/L (ref 38–126)
Anion gap: 17 — ABNORMAL HIGH (ref 5–15)
BUN: 108 mg/dL — ABNORMAL HIGH (ref 6–20)
CO2: 23 mmol/L (ref 22–32)
Calcium: 8 mg/dL — ABNORMAL LOW (ref 8.9–10.3)
Chloride: 99 mmol/L (ref 98–111)
Creatinine, Ser: 4.15 mg/dL — ABNORMAL HIGH (ref 0.44–1.00)
GFR calc Af Amer: 13 mL/min — ABNORMAL LOW (ref >60)
GFR calc non Af Amer: 11 mL/min — ABNORMAL LOW (ref >60)
Glucose, Bld: 123 mg/dL — ABNORMAL HIGH (ref 70–99)
Potassium: 5.4 mmol/L — ABNORMAL HIGH (ref 3.5–5.1)
Sodium: 139 mmol/L (ref 135–145)
Total Bilirubin: 1.2 mg/dL (ref 0.3–1.2)
Total Protein: 4.9 g/dL — ABNORMAL LOW (ref 6.5–8.1)

## 2020-04-25 LAB — MAGNESIUM: Magnesium: 2.3 mg/dL (ref 1.7–2.4)

## 2020-04-25 SURGERY — DIALYSIS/PERMA CATHETER INSERTION
Anesthesia: Choice

## 2020-04-25 MED ORDER — MIDAZOLAM HCL 2 MG/2ML IJ SOLN
INTRAMUSCULAR | Status: AC
  Filled 2020-04-25: qty 2

## 2020-04-25 MED ORDER — AMIODARONE HCL 200 MG PO TABS
200.0000 mg | ORAL_TABLET | Freq: Two times a day (BID) | ORAL | Status: DC
  Administered 2020-04-25 – 2020-05-19 (×41): 200 mg via ORAL
  Filled 2020-04-25 (×46): qty 1

## 2020-04-25 MED ORDER — HEPARIN SOD (PORK) LOCK FLUSH 100 UNIT/ML IV SOLN
INTRAVENOUS | Status: AC
  Filled 2020-04-25: qty 5

## 2020-04-25 MED ORDER — FENTANYL CITRATE (PF) 100 MCG/2ML IJ SOLN
INTRAMUSCULAR | Status: AC
Start: 2020-04-25 — End: 2020-04-25
  Filled 2020-04-25: qty 2

## 2020-04-25 MED ORDER — SODIUM CHLORIDE 0.9 % IV SOLN
500.0000 mg | Freq: Two times a day (BID) | INTRAVENOUS | Status: DC
  Administered 2020-04-25 – 2020-04-26 (×3): 500 mg via INTRAVENOUS
  Filled 2020-04-25: qty 500
  Filled 2020-04-25 (×2): qty 0.5
  Filled 2020-04-25: qty 500

## 2020-04-25 MED ORDER — FENTANYL CITRATE (PF) 100 MCG/2ML IJ SOLN
INTRAMUSCULAR | Status: AC
  Filled 2020-04-25: qty 2

## 2020-04-25 MED ORDER — RILPIVIRINE HCL 25 MG PO TABS
25.0000 mg | ORAL_TABLET | Freq: Every day | ORAL | Status: DC
  Administered 2020-04-26 – 2020-04-29 (×4): 25 mg via ORAL
  Filled 2020-04-25 (×7): qty 1

## 2020-04-25 MED ORDER — FENTANYL CITRATE (PF) 100 MCG/2ML IJ SOLN
INTRAMUSCULAR | Status: DC | PRN
  Administered 2020-04-25 (×2): 50 ug via INTRAVENOUS

## 2020-04-25 MED ORDER — MIDAZOLAM HCL 2 MG/2ML IJ SOLN
INTRAMUSCULAR | Status: DC | PRN
  Administered 2020-04-25: 2 mg via INTRAVENOUS

## 2020-04-25 MED ORDER — FAMOTIDINE 20 MG PO TABS
10.0000 mg | ORAL_TABLET | Freq: Every day | ORAL | Status: DC
  Administered 2020-04-26 – 2020-05-22 (×20): 10 mg via ORAL
  Filled 2020-04-25 (×20): qty 1

## 2020-04-25 MED ORDER — HEPARIN SODIUM (PORCINE) 10000 UNIT/ML IJ SOLN
INTRAMUSCULAR | Status: AC
  Filled 2020-04-25: qty 1

## 2020-04-25 SURGICAL SUPPLY — 13 items
ADH SKN CLS APL DERMABOND .7 (GAUZE/BANDAGES/DRESSINGS) ×1
BIOPATCH RED 1 DISK 7.0 (GAUZE/BANDAGES/DRESSINGS) ×1 IMPLANT
BIOPATCH RED 1IN DISK 7.0MM (GAUZE/BANDAGES/DRESSINGS) ×1
CATH CANNON HEMO 15FR 19 (HEMODIALYSIS SUPPLIES) ×2 IMPLANT
DERMABOND ADVANCED (GAUZE/BANDAGES/DRESSINGS) ×2
DERMABOND ADVANCED .7 DNX12 (GAUZE/BANDAGES/DRESSINGS) IMPLANT
PACK ANGIOGRAPHY (CUSTOM PROCEDURE TRAY) ×2 IMPLANT
STOPCOCK PLUG RED CAP (CAP) ×4 IMPLANT
SUT MNCRL AB 4-0 PS2 18 (SUTURE) ×2 IMPLANT
SUT PROLENE 0 CT 1 30 (SUTURE) ×2 IMPLANT
SUT VIC AB 3-0 CT1 27 (SUTURE) ×3
SUT VIC AB 3-0 CT1 TAPERPNT 27 (SUTURE) IMPLANT
TOWEL OR 17X26 4PK STRL BLUE (TOWEL DISPOSABLE) ×2 IMPLANT

## 2020-04-25 NOTE — Care Management Important Message (Signed)
Important Message  Patient Details  Name: Melanie Bowen MRN: [id number] Date of Birth: 1961-07-20   Medicare Important Message Given:  Other (see comment)  Patient out of room at time of Medicare IM attempt.     Dannette Barbara 04/25/2020, 3:02 PM

## 2020-04-25 NOTE — Progress Notes (Signed)
PT Cancellation Note  Patient Details Name: Melanie Bowen MRN: [id number] DOB: [birth year]   Cancelled Treatment:    Reason Eval/Treat Not Completed: Medical issues which prohibited therapy (K+: 5.4 this date, outside of safe range for PT. Will resume services at later date/time.)    1:47 PM, 04/25/20 Etta Grandchild, PT, DPT Physical Therapist - [facility]  [phone] ([place])     [place] C 04/25/2020, 1:47 PM

## 2020-04-25 NOTE — Op Note (Signed)
OPERATIVE NOTE    PRE-OPERATIVE DIAGNOSIS: 1. ESRD   POST-OPERATIVE DIAGNOSIS: same as above  PROCEDURE: 1. Ultrasound guidance for vascular access to the right internal jugular vein 2. Fluoroscopic guidance for placement of catheter 3. Placement of a 19 cm tip to cuff tunneled hemodialysis catheter via the right internal jugular vein  SURGEON: Leotis Pain, MD  ANESTHESIA:  Local with Moderate conscious sedation for approximately 15 minutes using 2 mg of Versed and 100 mcg of Fentanyl  ESTIMATED BLOOD LOSS: 5 cc  FLUORO TIME: less than one minute  CONTRAST: none  FINDING(S): 1.  Patent right internal jugular vein  SPECIMEN(S):  None  INDICATIONS:   Melanie Bowen is a 59 y.o.female who presents with renal failure.  The patient needs long term dialysis access for their ESRD, and a Permcath is necessary.  Risks and benefits are discussed and informed consent is obtained.    DESCRIPTION: After obtaining full informed written consent, the patient was brought back to the vascular suited. The patient's right neck and chest were sterilely prepped and draped in a sterile surgical field was created. Moderate conscious sedation was administered during a face to face encounter with the patient throughout the procedure with my supervision of the RN administering medicines and monitoring the patient's vital signs, pulse oximetry, telemetry and mental status throughout from the start of the procedure until the patient was taken to the recovery room.  The right internal jugular vein was visualized with ultrasound and found to be patent. It was then accessed under direct ultrasound guidance and a permanent image was recorded. A wire was placed. After skin nick and dilatation, the peel-away sheath was placed over the wire. I then turned my attention to an area under the clavicle. Approximately 1-2 fingerbreadths below the clavicle a small counterincision was created and tunneled from the  subclavicular incision to the access site. Using fluoroscopic guidance, a 19 centimeter tip to cuff tunneled hemodialysis catheter was selected, and tunneled from the subclavicular incision to the access site. It was then placed through the peel-away sheath and the peel-away sheath was removed. Using fluoroscopic guidance the catheter tips were parked in the right atrium. The appropriate distal connectors were placed. It withdrew blood well and flushed easily with heparinized saline and a concentrated heparin solution was then placed. It was secured to the chest wall with 2 Prolene sutures. The access incision was closed single 4-0 Monocryl. A 4-0 Monocryl pursestring suture was placed around the exit site. Sterile dressings were placed. The patient tolerated the procedure well and was taken to the recovery room in stable condition.  COMPLICATIONS: None  CONDITION: Stable  Leotis Pain, MD 04/25/2020 2:01 PM   This note was created with Dragon Medical transcription system. Any errors in dictation are purely unintentional.

## 2020-04-25 NOTE — Progress Notes (Signed)
While performing in and out catherization patient was noted to have open sores on perineal area and vaginal creamy discharge. Will notify physician and continue to monitor.

## 2020-04-25 NOTE — Progress Notes (Signed)
Patient Name: Melanie Bowen Date of Encounter: 04/25/2020  Hospital Problem List     Principal Problem:   Severe sepsis with septic shock [facility]) Active Problems:   HTN (hypertension)   HIV (human immunodeficiency virus infection) ([place])   Diverticulitis of colon   Abscess of sigmoid colon due to diverticulitis   Acute renal failure (ARF) ([place])   Hypotension   Diverticulosis   Acute respiratory distress   DNR (do not resuscitate) discussion   Palliative care by specialist   Dyspnea    Patient Profile     The patient is a 59 year old female with a past medical history of hypertension, HIV, diverticulitis / diverticulosis, asthma, degenerative disc disease, depression, anemia, active tobacco abuse who presented with abdominal pain admitted with septic shock secondary to sigmoid diverticulitis with abscess status post percutaneous drainage on 04/14/20. Had afib with rvr. Now predominantly nsr with amiodarone after iv and now po load.   Subjective     Inpatient Medications    . acidophilus  1 capsule Oral Daily  . alteplase  2 mg Intracatheter Once  . alteplase  2 mg Intracatheter Once  . amiodarone  400 mg Oral BID  . Chlorhexidine Gluconate Cloth  6 each Topical Daily  . docusate sodium  200 mg Oral BID  . dolutegravir  50 mg Oral Daily  . famotidine  40 mg Oral Q1200  . feeding supplement (NEPRO CARB STEADY)  237 mL Oral BID BM  . guaiFENesin-dextromethorphan  10 mL Oral Q8H  . heparin injection (subcutaneous)  5,000 Units Subcutaneous Q8H  . methylPREDNISolone (SOLU-MEDROL) injection  40 mg Intravenous Q24H  . mometasone-formoterol  2 puff Inhalation BID  . multivitamin  1 tablet Oral QHS  . pregabalin  25 mg Oral Daily  . rilpivirine  25 mg Oral Q breakfast  . sodium chloride flush  5 mL Intracatheter Q8H  . sodium zirconium cyclosilicate  10 g Oral Daily    Vital Signs    Vitals:   04/24/20 1606 04/24/20 2024 04/24/20 2102 04/25/20 0508  BP: 138/68  127/69  120/66  Pulse: (!) 101  (!) 108 (!) 104  Resp: 18  20 18   Temp: 98.5 F (36.9 C)  98.4 F (36.9 C) 98.4 F (36.9 C)  TempSrc: Oral  Oral Oral  SpO2: 97% 94% 97% 94%  Weight:    133.1 kg  Height:        Intake/Output Summary (Last 24 hours) at 04/25/2020 0755 Last data filed at 04/25/2020 0500 Gross per 24 hour  Intake 117.3 ml  Output 1170 ml  Net -1052.7 ml   Filed Weights   04/22/20 1649 04/24/20 0455 04/25/20 0508  Weight: 131 kg 133.5 kg 133.1 kg    Physical Exam      Labs    CBC Recent Labs    04/22/20 1112 04/23/20 0545 04/24/20 0508 04/25/20 0613  WBC 35.1*   < > 33.3* 34.1*  NEUTROABS 32.0*  --   --   --   HGB 11.4*   < > 10.1* 10.5*  HCT 32.9*   < > 29.8* 30.9*  MCV 87.7   < > 91.1 89.6  PLT 130*   < > 179 206   < > = values in this interval not displayed.   Basic Metabolic Panel Recent Labs    04/24/20 0508 04/25/20 0613  NA 135 139  K 5.2* 5.4*  CL 97* 99  CO2 22 23  GLUCOSE 143* 123*  BUN 95*  108*  CREATININE 4.13* 4.15*  CALCIUM 7.9* 8.0*  MG 2.2 2.3  PHOS 8.6* 9.1*   Liver Function Tests Recent Labs    04/24/20 0508 04/25/20 0613  AST 36 36  ALT 60* 44  ALKPHOS 101 100  BILITOT 1.0 1.2  PROT 5.0* 4.9*  ALBUMIN 2.0* 1.9*   No results for input(s): LIPASE, AMYLASE in the last 72 hours. Cardiac Enzymes No results for input(s): CKTOTAL, CKMB, CKMBINDEX, TROPONINI in the last 72 hours. BNP No results for input(s): BNP in the last 72 hours. D-Dimer No results for input(s): DDIMER in the last 72 hours. Hemoglobin A1C No results for input(s): HGBA1C in the last 72 hours. Fasting Lipid Panel No results for input(s): CHOL, HDL, LDLCALC, TRIG, CHOLHDL, LDLDIRECT in the last 72 hours. Thyroid Function Tests No results for input(s): TSH, T4TOTAL, T3FREE, THYROIDAB in the last 72 hours.  Invalid input(s): FREET3  Telemetry    nsr  ECG      Radiology    CT ABDOMEN PELVIS WO CONTRAST  Result Date: 04/18/2020 CLINICAL  DATA:  59 year old female with concern for abdominal abscess or infection. EXAM: CT ABDOMEN AND PELVIS WITHOUT CONTRAST TECHNIQUE: Multidetector CT imaging of the abdomen and pelvis was performed following the standard protocol without IV contrast. COMPARISON:  CT abdomen pelvis dated 04/14/2020. FINDINGS: Evaluation of this exam is limited due to respiratory motion artifact. Lower chest: Partially visualized clusters of ground-glass opacity primarily involving the right lung base may represent areas of atelectasis but concerning for pneumonia. Clinical correlation is recommended. No intra-abdominal free air. There is a small free fluid within the pelvis. Hepatobiliary: The liver is unremarkable. No intrahepatic biliary ductal dilatation. High attenuating content within the gallbladder, likely vicarious excretion of contrast. Pancreas: Unremarkable. No pancreatic ductal dilatation or surrounding inflammatory changes. Spleen: Normal in size without focal abnormality. Adrenals/Urinary Tract: The adrenal glands unremarkable. Retained contrast from prior CT within the renal parenchyma consistent with delayed clearance and acute renal insufficiency. There is striated renal parenchyma concerning for pyelonephritis. Correlation with urinalysis recommended. There is no hydronephrosis. The visualized ureters appear unremarkable. The urinary bladder is decompressed around a Foley catheter. Stomach/Bowel: There is distal colonic and sigmoid diverticulosis without active inflammatory changes. Several mildly dilated small bowel loops measuring up to 3.4 cm in caliber without a transition most likely an ileus. Oral contrast traverses into the cecum. The appendix is normal. Vascular/Lymphatic: Advanced aortoiliac atherosclerotic disease. The IVC is unremarkable. No portal venous gas. There is no adenopathy. Reproductive: The uterus is grossly unremarkable. Other: Diffuse edema of the pelvic floor with small free fluid. Overall  decrease in the inflammatory changes and stranding compared to the prior CT. A pigtail drainage catheter is noted within the pelvis in similar position. No drainable fluid collection identified. Musculoskeletal: There is osteopenia with degenerative changes of the spine. L3-L5 disc spacer and posterior fusion. There is disc desiccation and vacuum phenomena at L5-S1. No acute osseous pathology. IMPRESSION: 1. Partially visualized right lung base atelectasis or pneumonia. Clinical correlation is recommended. 2. Retained contrast within the renal parenchyma concerning for acute renal insufficiency. There is heterogeneous and striated nephrogram which may represent pyelonephritis. Correlation with urinalysis recommended. 3. Mild ileus.  No definite obstruction. 4. Colonic diverticulosis. 5. Overall decrease in the inflammatory changes and stranding compared to the prior CT. A pigtail drainage catheter within the pelvis is similar position. No drainable fluid collection identified. 6. Aortic Atherosclerosis (ICD10-I70.0). Electronically Signed   By: Anner Crete M.D.   On:  04/18/2020 17:59   CT ABDOMEN PELVIS WO CONTRAST  Result Date: 04/14/2020 CLINICAL DATA:  Abdominal pain. Contained perforated acute diverticulitis. EXAM: CT ABDOMEN AND PELVIS WITHOUT CONTRAST TECHNIQUE: Multidetector CT imaging of the abdomen and pelvis was performed following the standard protocol without IV contrast. COMPARISON:  CT 04/14/2020, 01/31/2019 FINDINGS: Lower chest: Lung bases are clear. Hepatobiliary: No focal hepatic lesion. No biliary duct dilatation. Common bile duct is normal. Pancreas: Pancreas is normal. No ductal dilatation. No pancreatic inflammation. Spleen: Normal spleen Adrenals/urinary tract: Adrenal glands and kidneys are normal. The ureters and bladder normal. Stomach/Bowel: Stomach duodenum normal. Mildly thickened loops small bowel in the pelvis is favored secondary inflammation related to pelvic abscess.  Appendix is partially imaged (image 61/2) appears normal. The ascending and transverse colon normal. There are diverticula descending colon and sigmoid colon. Diverticular disease is heavy in the proximal sigmoid colon. There is a thick-walled fluid collection with the an air-fluid level measuring 8.1 x 4.7 cm in the central anterior upper pelvis. This is adjacent to the sigmoid colon. There is a thin tract between the colon in the fluid collection on image 71/2. Similar pericolonic abscess seen on CT 01/17/2019. Findings most consistent with contained sigmoid perforation with abscess formation. No intraperitoneal free.  Minimal fluid in the pelvis. Vascular/Lymphatic: Abdominal aorta is normal caliber with atherosclerotic calcification. There is no retroperitoneal or periportal lymphadenopathy. No pelvic lymphadenopathy. Reproductive: Post hysterectomy.  Adnexa unremarkable Other: Small volume free fluid the pelvis Musculoskeletal: Posterior lumbar fusion IMPRESSION: 1. No significant interval change in short interval follow-up. 2. Contained sigmoid colon perforation with abscess formation. Perforation presumed related to diverticulitis. Findings similar to May 2020 with increased volume of contained perforation. 3. No intraperitoneal free air. 4. Secondary inflammation of the small bowel. Electronically Signed   By: Suzy Bouchard M.D.   On: 04/14/2020 08:08   DG Chest 1 View  Result Date: 04/14/2020 CLINICAL DATA:  Dyspnea EXAM: CHEST  1 VIEW COMPARISON:  11/06/2019 FINDINGS: Mildly low lung volumes. There is no edema, consolidation, effusion, or pneumothorax. Normal heart size and mediastinal contours. Artifact from EKG leads IMPRESSION: No evidence of acute disease. Electronically Signed   By: Monte Fantasia M.D.   On: 04/14/2020 07:17   DG Abd 1 View  Result Date: 04/14/2020 CLINICAL DATA:  Abdominal pain.  Pelvic abscess drainage today. EXAM: ABDOMEN - 1 VIEW COMPARISON:  CT abdomen pelvis from  same day. FINDINGS: The bowel gas pattern is normal. No intraperitoneal free air peer no radio-opaque calculi or other significant radiographic abnormality are seen. No acute osseous abnormality. Prior lumbar fusion. IMPRESSION: 1. Negative. Electronically Signed   By: Titus Dubin M.D.   On: 04/14/2020 14:37   CT ABDOMEN PELVIS W CONTRAST  Result Date: 04/14/2020 CLINICAL DATA:  Peritonitis or perforation suspected Patient with history of perforated diverticulitis post percutaneous drain placement today. EXAM: CT ABDOMEN AND PELVIS WITH CONTRAST TECHNIQUE: Multidetector CT imaging of the abdomen and pelvis was performed using the standard protocol following bolus administration of intravenous contrast. CONTRAST:  23mL OMNIPAQUE IOHEXOL 300 MG/ML  SOLN COMPARISON:  Two prior abdominopelvic CT earlier this day, prior to drain placement. FINDINGS: Lower chest: Small pleural effusions are new from prior exam. Adjacent compressive atelectasis. Breathing motion artifact in the lung bases limits assessment. Hepatobiliary: Small amount of perihepatic fluid slightly increased from prior exam. This appears mildly complex. High-density contents in the urinary bladder increasing likely vicarious excretion of IV contrast. There is no discrete focal hepatic abnormality allowing  for motion. Pancreas: Motion artifact through the pancreas. No obvious inflammation. Spleen: Normal allowing for motion. Adrenals/Urinary Tract: No adrenal nodule. There is heterogeneous enhancement of both kidneys. No significant perinephric edema. Minimal excretion from both kidneys on delayed phase imaging. There is excreted IV contrast within the urinary bladder. Stomach/Bowel: New surgical drain within the pelvic fluid collection. No definite residual fluid. A drain is closely approximated to fecalized loops of small bowel in the pelvis. Motion artifact limits detailed bowel assessment. Similar appearance of scattered small bowel thickening  from prior exam which is likely reactive. There is no evidence of obstruction. No bowel pneumatosis. Diverticulosis involving the colon. No new colonic inflammation or diverticulitis. Air-fluid level in the stomach with small amount of fluid in the distal esophagus. Vascular/Lymphatic: Aortic atherosclerosis. No aortic aneurysm. No evidence of portal venous or mesenteric gas. No bulky adenopathy. Reproductive: Uterus and bilateral adnexa are unremarkable. Other: Surgical drain within the pelvic fluid collection which appears completely decompressed. No definite residual collection. Generalized fat stranding involving the lower small bowel mesentery. Small amount of non organized free fluid in the dependent pelvis, tracking into the pericolic gutters and right upper quadrant. There is no definite free air. Musculoskeletal: Stable. Postsurgical change in the lower lumbar spine. IMPRESSION: 1. Interval percutaneous drainage of pelvic abscess. No residual fluid collection. 2. Small amount of non organized free fluid in the pelvis, pericolic gutters, and perihepatic right upper quadrant, slight increase from pre drainage CT earlier today. No evidence of new abscess. No free air or interval perforation. 3. Heterogeneous enhancement of both kidneys, nonspecific but can be seen with pyelonephritis. 4. Small pleural effusions are new from prior exam. Adjacent compressive atelectasis. Electronically Signed   By: Keith Rake M.D.   On: 04/14/2020 22:22   CT Abdomen Pelvis W Contrast  Result Date: 04/14/2020 CLINICAL DATA:  Lower abdominal pain EXAM: CT ABDOMEN AND PELVIS WITH CONTRAST TECHNIQUE: Multidetector CT imaging of the abdomen and pelvis was performed using the standard protocol following bolus administration of intravenous contrast. CONTRAST:  17mL OMNIPAQUE IOHEXOL 300 MG/ML  SOLN COMPARISON:  01/31/2019 FINDINGS: Lower chest: Lung bases demonstrate no acute consolidation or pleural effusion. Normal cardiac  size. Hepatobiliary: No focal liver abnormality is seen. No gallstones, gallbladder wall thickening, or biliary dilatation. Pancreas: Unremarkable. No pancreatic ductal dilatation or surrounding inflammatory changes. Spleen: Normal in size without focal abnormality. Adrenals/Urinary Tract: Adrenal glands are unremarkable. Kidneys are normal, without renal calculi, focal lesion, or hydronephrosis. Bladder is unremarkable. Stomach/Bowel: The stomach is nonenlarged. No dilated small bowel. Mildly thickened loops of small bowel within the pelvis, likely reactive. Negative appendix. Sigmoid colon diverticular disease. Wall thickening and mild inflammatory change at the distal sigmoid colon consistent with diverticulitis. 7.5 x 4.4 cm gas and fluid collection within the anterior pelvis suspicious for contained perforation/abscess. Soft tissue inflammatory density extends from inflamed sigmoid colon to the gas and fluid collection, sagittal series 6, image number 96. Vascular/Lymphatic: Moderate aortic atherosclerosis. No aneurysm. No suspicious nodes. Reproductive: Uterus and bilateral adnexa are unremarkable. Other: No free air. Small free fluid in the pelvis. Small amount of upper abdominal ascites. Generalized soft tissue stranding within the pelvis consistent with inflammatory process. Musculoskeletal: Postsurgical changes of the lumbar spine with surgical rod and fixating screws L3 through L5. No acute osseous abnormality IMPRESSION: 1. Findings consistent with acute sigmoid colon diverticulitis. 7.5 x 4.4 cm gas and fluid collection within the anterior pelvis suspicious for contained perforation/abscess, this appears to communicate with the thickened inflamed  sigmoid colon. 2. Small amount of upper abdominal and pelvic ascites. 3. Thickened appearance of pelvic small bowel loops, likely reactive Aortic Atherosclerosis (ICD10-I70.0). Electronically Signed   By: Donavan Foil M.D.   On: 04/14/2020 01:27   US  RENAL  Result Date: 04/16/2020 CLINICAL DATA:  Acute renal failure. EXAM: RENAL / URINARY TRACT ULTRASOUND COMPLETE COMPARISON:  CT abdomen and pelvis 04/14/2020 FINDINGS: Right Kidney: Renal measurements: 11.5 x 5.3 x 4.9 cm = volume: 158 mL. Echogenicity within normal limits. No mass or hydronephrosis visualized. Left Kidney: Renal measurements: 11.3 x 5.5 x 6.1 cm = volume: 197 mL. Echogenicity within normal limits. No mass or hydronephrosis visualized. Bladder: Decompressed by Foley catheter. Other: None. IMPRESSION: Negative renal ultrasound. Electronically Signed   By: Logan Bores M.D.   On: 04/16/2020 13:01   MR SHOULDER RIGHT WO CONTRAST  Result Date: 04/01/2020 CLINICAL DATA:  Right shoulder pain and limited range of motion. No specific injury. EXAM: MRI OF THE RIGHT SHOULDER WITHOUT CONTRAST TECHNIQUE: Multiplanar, multisequence MR imaging of the shoulder was performed. No intravenous contrast was administered. COMPARISON:  None. FINDINGS: Examination limited by patient motion. Rotator cuff: Full-thickness retracted tear of the supraspinatus tendon is noted. This involves the mid and posterior fibers. The tear is in the critical zone region. Maximum retraction is estimated at 22 mm and the tear is 15.5 mm wide. The anterior fibers are still intact and the subscapularis tendon is intact. The infraspinatus tendon is intact. Tendinopathy noted with interstitial tears. Muscles:  No significant findings. Biceps long head:  Intact. Acromioclavicular Joint: Moderate degenerative changes. Type 2 acromion. No significant lateral downsloping or undersurface spurring. Glenohumeral Joint: Moderate to advanced degenerative changes with areas of full or near full-thickness cartilage loss, joint space narrowing, spurring and subchondral cystic change. Labrum:  Labral degenerative changes without obvious tear. Bones: No acute bony findings. There appears to be a small defect in the glenoid with fluid tracking back  into the subcoracoid space. Possible old fracture. Other: Expected fluid in the subacromial/subdeltoid bursa. IMPRESSION: 1. Full-thickness retracted tear of the supraspinatus tendon as described above. 2. Intact long head biceps tendon and grossly intact glenoid labrum. 3. Advanced glenohumeral joint degenerative changes. 4. Moderate AC joint degenerative changes but no other significant findings for bony impingement. Electronically Signed   By: Marijo Sanes M.D.   On: 04/01/2020 20:48   US Venous Img Upper Uni Right(DVT)  Result Date: 04/23/2020 CLINICAL DATA:  Right upper extremity pain and swelling for 1 week EXAM: RIGHT UPPER EXTREMITY VENOUS DOPPLER ULTRASOUND TECHNIQUE: Gray-scale sonography with graded compression, as well as color Doppler and duplex ultrasound were performed to evaluate the upper extremity deep venous system from the level of the subclavian vein and including the jugular, axillary, basilic, radial, ulnar and upper cephalic vein. Spectral Doppler was utilized to evaluate flow at rest and with distal augmentation maneuvers. COMPARISON:  None. FINDINGS: Contralateral Subclavian Vein: Respiratory phasicity is normal and symmetric with the symptomatic side. No evidence of thrombus. Normal compressibility. Internal Jugular Vein: No evidence of thrombus. Normal compressibility, respiratory phasicity and response to augmentation. Subclavian Vein: No evidence of thrombus. Normal compressibility, respiratory phasicity and response to augmentation. Axillary Vein: No evidence of thrombus. Normal compressibility, respiratory phasicity and response to augmentation. Cephalic Vein: No evidence of thrombus. Normal compressibility, respiratory phasicity and response to augmentation. Basilic Vein: No evidence of thrombus. Normal compressibility, respiratory phasicity and response to augmentation. Brachial Veins: No evidence of thrombus. Normal compressibility, respiratory phasicity and response  to  augmentation. Radial Veins: No evidence of thrombus. Normal compressibility, respiratory phasicity and response to augmentation. Ulnar Veins: No evidence of thrombus. Normal compressibility, respiratory phasicity and response to augmentation. IMPRESSION: No evidence of DVT within the right upper extremity. Electronically Signed   By: Jerilynn Mages.  Shick M.D.   On: 04/23/2020 07:43   DG Abd Portable 2V  Result Date: 04/15/2020 CLINICAL DATA:  Diverticulitis.  Abdominal pain and distention. EXAM: PORTABLE ABDOMEN - 2 VIEW COMPARISON:  CT 04/14/2020. FINDINGS: Pelvic drainage catheter again noted. No bowel distention. Stool noted throughout the colon. No free air. Bibasilar atelectasis. Degenerative change lumbar spine and both hips. Contrast in the bladder from prior CT. IMPRESSION: 1.  Pelvic drainage catheter in stable position. 2. No acute abdominal abnormality identified. No bowel distention or free air. 3.  Bibasilar atelectasis. Electronically Signed   By: Marcello Moores  Register   On: 04/15/2020 08:07   CT IMAGE GUIDED DRAINAGE PERCUT CATH  PERITONEAL RETROPERIT  Result Date: 04/14/2020 INDICATION: 59 year old with air-fluid collection in the lower abdomen / upper pelvic region. Findings are suggestive for a colonic diverticular abscess. Patient needs CT-guided drainage. EXAM: CT-GUIDED DRAINAGE OF PELVIC ABSCESS MEDICATIONS: Fentanyl 25 mcg ANESTHESIA/SEDATION: The patient was continuously monitored during the procedure by the interventional radiology nurse under my direct supervision. COMPLICATIONS: None immediate. PROCEDURE: Informed written consent was obtained from the patient after a thorough discussion of the procedural risks, benefits and alternatives. All questions were addressed. A timeout was performed prior to the initiation of the procedure. Patient was placed supine on the CT scanner. Images through the lower abdomen and pelvis were performed. Air-fluid collection in the lower abdomen/pelvic region was  identified and targeted for drainage. The anterior abdomen was prepped with chlorhexidine and sterile field was created. Skin and soft tissues were anesthetized using 1% lidocaine. A small incision was made. Using CT guidance, a 18 gauge trocar needle was directed into the air-fluid collection. Gray purulent fluid was coming out of the needle. Super stiff Amplatz wire was advanced into the collection and follow up CT images were obtained. The drain was dilated to accommodate a 12 [place] multipurpose drain. 12 French drain was placed. 40 mL gray colored purulent fluid and gas was removed from the collection. Follow up CT images were obtained. Catheter was sutured to skin and attached to a suction bulb. FINDINGS: Air-fluid collection just cephalad to the urinary bladder. Drain was successfully placed within the collection and 40 mL of purulent fluid was removed. The abscess was decompressed at the end of the procedure. IMPRESSION: CT-guided placement of a drainage catheter within the lower abdomen/pelvic abscess. Electronically Signed   By: Markus Daft M.D.   On: 04/14/2020 11:48    Assessment & Plan    59 yo female with history of copd, hypertension HIV , admitted with sepsis and has developed elevated liver transaminases,acute renal failure requiring hd and afib with rvr.  Afib-Predominantly nsr. Will decrease amiodarone to 200 bid and would continue with this dose for at least 3 weeks. Will need to consider long term anticoagulation when drains are removed an bcess resolved.   Renal failure-on HD. Renal insufficiency likely secondary to contrast. Creatinine was 4.6 yesterday.  This morning's value is pending.  Sepsis-. Continues on broad-spectrum antibiotics.  Clinically is improved.  Liver failure-elevated transaminases have improved.  Respiratory failure-clinically improved.  Doing well.  Pelvic abscess-being followed by general surgery. JP drain.   Signed, Javier Docker Benigna Delisi  MD 04/25/2020, 7:55 AM  Pager: [phone]  

## 2020-04-25 NOTE — Progress Notes (Addendum)
PROGRESS NOTE    Melanie Bowen  FUX:[id number] DOB: 1960-10-13 DOA: 04/14/2020 PCP: Theotis Burrow, MD   Assessment & Plan:   Principal Problem:   Severe sepsis with septic shock ([place]) Active Problems:   HTN (hypertension)   HIV (human immunodeficiency virus infection) ([place])   Diverticulitis of colon   Abscess of sigmoid colon due to diverticulitis   Acute renal failure (ARF) ([place])   Hypotension   Diverticulosis   Acute respiratory distress   DNR (do not resuscitate) discussion   Palliative care by specialist   Dyspnea   Septic shock: w/ end organ failure, resolved. Secondary to diverticulosis/abscess. Continue on IV merrem. Initially patient was on BiPAP in ICU which she continued to refuse & then placed on high flow and now currently on 3L [place]. S/p IR placement of gallbladder drain. No surgical intervention currently as per gen surg.   Acute renal failure: secondary to septic shock. Continue on HD as per nephro. Will need another cath for HD today. Nephro recs apprec   Gallbladder abscess: fluid aerobic/anaerobic cultures shows klebsiella pneumonia, pseudomonas aeruginosa, e. coli. Continue on IV merrem as per ID   Acute sigmoid diverticulitis and diverticular abscess: s/p CT-guided drainage by IR on 8/16. Continue on IV abxs as per ID    Hyperkalemia: will be managed w/ HD.  Hypotension: resolved   Leukocytosis: significant & labile. Continue on IV merrem as per ID. Possibly secondary to steroid use so tapered off of steroids today.   HIV: well controlled CD4>1000 as per ID. Continue on antivirals. Management per ID   Lactic acidosis: resolved   Acute hypoxic respiratory failure: secondary to COPD exacerbation. D/c steroids. Continue on bronchodilators. Encourage incentive spirometry. Resolved  A. Fib: w/ RVR. Continue on amiodarone. Unable to tolerate cardizem due to hypotension. Withholding anticoagulation at this time due to severe infection. Cardio  recs apprec   Acute liver failure: resolved    Morbid obesity: BMI 46.6. Complicates overall care and prognosis    Metabolic encephalopathy: resolved    DVT prophylaxis: heparin Code Status: No intubation Family Communication:  Disposition Plan: likely d/c to CIR vs SNF  Status is: Inpatient  Remains inpatient appropriate because:IV treatments appropriate due to intensity of illness or inability to take PO   Dispo: The patient is from: Home              Anticipated d/c is to: CIR              Anticipated d/c date is: 3 days              Patient currently is not medically stable to d/c.    Consultants:   General Surgery  ID  Vascular surgery    Procedures:    Antimicrobials: merrem    Subjective: Pt c/o malaise   Objective: Vitals:   04/25/20 0508 04/25/20 0825 04/25/20 1155 04/25/20 1222  BP: 120/66 128/71 (!) 142/90 110/63  Pulse: (!) 104 (!) 103 99 98  Resp: 18 17 18 16   Temp: 98.4 F (36.9 C) 98.5 F (36.9 C) 97.9 F (36.6 C) 98.3 F (36.8 C)  TempSrc: Oral Oral Oral Oral  SpO2: 94% 97% 96% 95%  Weight: 133.1 kg     Height:        Intake/Output Summary (Last 24 hours) at 04/25/2020 1245 Last data filed at 04/25/2020 0500 Gross per 24 hour  Intake 117.3 ml  Output 1170 ml  Net -1052.7 ml   [employer]  04/22/20 1649 04/24/20 0455 04/25/20 0508  Weight: 131 kg 133.5 kg 133.1 kg    Examination:  General exam: Appears calm and comfortable  Respiratory system: decreased breath sounds b/l  Cardiovascular system: S1 & S2+. No rubs, gallops or clicks.  Gastrointestinal system: Abdomen is nondistended, soft and nontender. Normal bowel sounds heard. Central nervous system: Alert and oriented. Moves all 4 extremities Psychiatry: Judgement and insight appear normal. Flat mood and affect      Data Reviewed: I have personally reviewed following labs and imaging studies  CBC: Recent Labs  Lab 04/19/20 0458 04/22/20 1112 04/23/20 0545  04/24/20 0508 04/25/20 0613  WBC 24.7* 35.1* 40.1* 33.3* 34.1*  NEUTROABS  --  32.0*  --   --   --   HGB 11.5* 11.4* 11.1* 10.1* 10.5*  HCT 31.3* 32.9* 31.1* 29.8* 30.9*  MCV 82.2 87.7 85.9 91.1 89.6  PLT 36* 130* 194 179 073   Basic Metabolic Panel: Recent Labs  Lab 04/19/20 0848 04/22/20 0436 04/22/20 1112 04/23/20 0545 04/24/20 0508 04/25/20 0613  NA 136  --  135 134* 135 139  K 5.5*  --  5.0 5.2* 5.2* 5.4*  CL 100  --  96* 95* 97* 99  CO2 18*  --  19* 20* 22 23  GLUCOSE 143*  --  198* 186* 143* 123*  BUN 61*  --  95* 108* 95* 108*  CREATININE 3.89*  --  4.61* 5.00* 4.13* 4.15*  CALCIUM 8.2*  --  7.9* 8.4* 7.9* 8.0*  MG 1.9 2.3  --  2.4 2.2 2.3  PHOS 6.2* 8.5*  --  10.0* 8.6* 9.1*   GFR: Estimated Creatinine Clearance: 20.7 mL/min (A) (by C-G formula based on SCr of 4.15 mg/dL (H)). Liver Function Tests: Recent Labs  Lab 04/19/20 0848 04/22/20 1112 04/23/20 0545 04/24/20 0508 04/25/20 0613  AST 94* 40 32 36 36  ALT 419* 113* 83* 60* 44  ALKPHOS 113 107 106 101 100  BILITOT 1.2 0.7 1.2 1.0 1.2  PROT 5.2* 5.3* 5.3* 5.0* 4.9*  ALBUMIN 1.9* 2.1* 2.1* 2.0* 1.9*   No results for input(s): LIPASE, AMYLASE in the last 168 hours. No results for input(s): AMMONIA in the last 168 hours. Coagulation Profile: No results for input(s): INR, PROTIME in the last 168 hours. Cardiac Enzymes: No results for input(s): CKTOTAL, CKMB, CKMBINDEX, TROPONINI in the last 168 hours. BNP (last 3 results) No results for input(s): PROBNP in the last 8760 hours. HbA1C: No results for input(s): HGBA1C in the last 72 hours. CBG: No results for input(s): GLUCAP in the last 168 hours. Lipid Profile: No results for input(s): CHOL, HDL, LDLCALC, TRIG, CHOLHDL, LDLDIRECT in the last 72 hours. Thyroid Function Tests: No results for input(s): TSH, T4TOTAL, FREET4, T3FREE, THYROIDAB in the last 72 hours. Anemia Panel: No results for input(s): VITAMINB12, FOLATE, FERRITIN, TIBC, IRON,  RETICCTPCT in the last 72 hours. Sepsis Labs: No results for input(s): PROCALCITON, LATICACIDVEN in the last 168 hours.  Recent Results (from the past 240 hour(s))  CULTURE, BLOOD (ROUTINE X 2) w Reflex to ID Panel     Status: None (Preliminary result)   Collection Time: 04/22/20  6:12 PM   Specimen: BLOOD  Result Value Ref Range Status   Specimen Description BLOOD FOOT  Final   Special Requests   Final    BOTTLES DRAWN AEROBIC AND ANAEROBIC Blood Culture adequate volume   Culture   Final    NO GROWTH 3 DAYS Performed at [facility],  [place], [place] [address]    Report Status PENDING  Incomplete  CULTURE, BLOOD (ROUTINE X 2) w Reflex to ID Panel     Status: None (Preliminary result)   Collection Time: 04/22/20  6:17 PM   Specimen: BLOOD  Result Value Ref Range Status   Specimen Description BLOOD FOOT  Final   Special Requests   Final    BOTTLES DRAWN AEROBIC AND ANAEROBIC Blood Culture results may not be optimal due to an excessive volume of blood received in culture bottles   Culture   Final    NO GROWTH 3 DAYS Performed at [facility], [address], [place], [place] [address]    Report Status PENDING  Incomplete         Radiology Studies: No results found.      Scheduled Meds: . fentaNYL      . midazolam      . acidophilus  1 capsule Oral Daily  . alteplase  2 mg Intracatheter Once  . alteplase  2 mg Intracatheter Once  . amiodarone  200 mg Oral BID  . Chlorhexidine Gluconate Cloth  6 each Topical Daily  . docusate sodium  200 mg Oral BID  . dolutegravir  50 mg Oral Daily  . famotidine  40 mg Oral Q1200  . feeding supplement (NEPRO CARB STEADY)  237 mL Oral BID BM  . guaiFENesin-dextromethorphan  10 mL Oral Q8H  . heparin injection (subcutaneous)  5,000 Units Subcutaneous Q8H  . mometasone-formoterol  2 puff Inhalation BID  . multivitamin  1 tablet Oral QHS  . pregabalin  25 mg Oral Daily  . rilpivirine  25 mg Oral  Q breakfast  . sodium chloride flush  5 mL Intracatheter Q8H  . sodium zirconium cyclosilicate  10 g Oral Daily   Continuous Infusions: . sodium chloride 20 mL/hr at 04/25/20 1230  . meropenem (MERREM) IV Stopped (04/25/20 1229)     LOS: 11 days    Time spent: 31 mins    Wyvonnia Dusky, MD [facility] Pager [phone]  If 7PM-7AM, please contact night-coverage [email or web address] 04/25/2020, 12:45 PM '

## 2020-04-25 NOTE — Progress Notes (Signed)
[facility] SURGICAL PROGRESS NOTE (cpt [phone])  Hospital Day(s): 11.   Interval History: Patient seen and examined, no acute events or new complaints overnight. Patient resting comfortably, no abdominal pain. She continues to have persistent leukocytosis to 34.1K this morning. Repeat BCx from 08/24 without growth. CMP and electrolytes are consistent with degree of renal failure, plan for permcath placement with vascular surgery today. Drain in LLQ output slowing, now 20 ccs, more serous in nature. She is tolerating diet but NPO this morning for procedure. She continues to have bowel function.   Review of Systems:  Constitutional: denies fever, chills  HEENT: denies cough or congestion  Respiratory: denies any shortness of breath  Cardiovascular: denies chest pain or palpitations  Gastrointestinal: denies abdominal pain, N/V, or diarrhea/and bowel function as per interval history Genitourinary: denies burning with urination or urinary frequency Musculoskeletal: denies pain, decreased motor or sensation  Vital signs in last 24 hours: [min-max] current  Temp:  [98.1 F (36.7 C)-98.8 F (37.1 C)] 98.4 F (36.9 C) (08/27 0508) Pulse Rate:  [98-154] 104 (08/27 0508) Resp:  [12-20] 18 (08/27 0508) BP: (120-141)/(66-76) 120/66 (08/27 0508) SpO2:  [94 %-97 %] 94 % (08/27 0508) Weight:  [133.1 kg] 133.1 kg (08/27 0508)     Height: 5\' 6"  (167.6 cm) Weight: 133.1 kg BMI (Calculated): 47.39   Intake/Output last 2 shifts:  08/26 0701 - 08/27 0700 In: 117.3 [I.V.:2.3; IV Piggyback:100] Out: 7829 [Urine:1150; Drains:20]   Physical Exam:  Constitutional: alert, cooperative and no distress,she is tearful and wants to get better HENT: normocephalic without obvious abnormality  Eyes: PERRL, EOM's grossly intact and symmetric  Respiratory: breathingcertainly less labored, back on RA, suspect she is closer to baseline Cardiovascular: regular  rate,regular Gastrointestinal:obese,soft, non-tender, and non-distended, no rebound/guarding/peritonitis.JP in LLQ and drainage appears more serous Musculoskeletal:+ 1 pitting edema to lower extremities, HD catheter in right groin    Labs:  CBC Latest Ref Rng & Units 04/25/2020 04/24/2020 04/23/2020  WBC 4.0 - 10.5 K/uL 34.1(H) 33.3(H) 40.1(H)  Hemoglobin 12.0 - 15.0 g/dL 10.5(L) 10.1(L) 11.1(L)  Hematocrit 36 - 46 % 30.9(L) 29.8(L) 31.1(L)  Platelets 150 - 400 K/uL 206 179 194   CMP Latest Ref Rng & Units 04/25/2020 04/24/2020 04/23/2020  Glucose 70 - 99 mg/dL 123(H) 143(H) 186(H)  BUN 6 - 20 mg/dL 108(H) 95(H) 108(H)  Creatinine 0.44 - 1.00 mg/dL 4.15(H) 4.13(H) 5.00(H)  Sodium 135 - 145 mmol/L 139 135 134(L)  Potassium 3.5 - 5.1 mmol/L 5.4(H) 5.2(H) 5.2(H)  Chloride 98 - 111 mmol/L 99 97(L) 95(L)  CO2 22 - 32 mmol/L 23 22 20(L)  Calcium 8.9 - 10.3 mg/dL 8.0(L) 7.9(L) 8.4(L)  Total Protein 6.5 - 8.1 g/dL 4.9(L) 5.0(L) 5.3(L)  Total Bilirubin 0.3 - 1.2 mg/dL 1.2 1.0 1.2  Alkaline Phos 38 - 126 U/L 100 101 106  AST 15 - 41 U/L 36 36 32  ALT 0 - 44 U/L 44 60(H) 83(H)    Imaging studies: No new pertinent imaging studies   Assessment/Plan: (ICD-10's: K55.20) 59 y.o. female with persistent leukocytosis this morning which I do not suspect is coming from intra-abdominal source, and otherwise continuesto maintain hemodynamics, admitted withseptic shock secondary tosigmoid diverticulitis with abscess s/p percutaneous drainage on 08/16andshe continues to bewithout any evidence of peritonitis/pneumoperitoneum/pneumatosis on examination or imaging,complicated by pertinent comorbidities includingmarked COPD   - NPO this morning; Okay to continue regular(or renal diet)diet after vascular procedure - Continue IV Abx(meropenem); ID followingand appreciate assistance; Cx withpseudomonas, kleb andESBLe.coli--> new  BCx drawn on 08/24 and are pending              -  Monitor abdominal examination; leukocytosis - No emergent surgical intervention - Pain control prn; antiemetics prn - Maintain JP drain; record output - Appreciate nephrology/cardiology assistance and recommendations - Continue to work with therapies; suspect she will need significant rehab - Further management per primary service; we will continue to follow   All of the above findings and recommendations were discussed with the patient, and the medical team, and all of patient's questions were answered to her expressed satisfaction.  -- Edison Simon, PA-C [facility] 04/25/2020, 7:56 AM [phone] M-F: 7am - 4pm

## 2020-04-25 NOTE — Progress Notes (Signed)
[facility]  ROUNDING NOTE   Subjective:  UOP has improved. Yesterday UOP was 1.1 liters.  Appears to be in better spirits today.   Objective:  Vital signs in last 24 hours:  Temp:  [97.9 F (36.6 C)-98.5 F (36.9 C)] 97.9 F (36.6 C) (08/27 1155) Pulse Rate:  [99-154] 99 (08/27 1155) Resp:  [12-20] 18 (08/27 1155) BP: (120-142)/(66-90) 142/90 (08/27 1155) SpO2:  [94 %-97 %] 96 % (08/27 1155) Weight:  [133.1 kg] 133.1 kg (08/27 0508)  Weight change: -0.369 kg Filed Weights   04/22/20 1649 04/24/20 0455 04/25/20 0508  Weight: 131 kg 133.5 kg 133.1 kg    Intake/Output: I/O last 3 completed shifts: In: 217.3 [I.V.:2.3; Other:15; IV Piggyback:200] Out: 1171 [Urine:1150; Drains:20; Stool:1]   Intake/Output this shift:  No intake/output data recorded.  Physical Exam: General:  Awake,alert, in no acute distress  Head:  Normocephalic, atraumatic.hearing intact  Eyes:  Sclerae and conjunctivae clear  Neck:  Supple  Lungs:   Clear bilateral, normal effort  Heart:  Regular rate and rhythm  Abdomen:   Soft,non tender, bowel sounds present  Extremities:  No peripheral edema.  Neurologic:  Awake, alert, oriented  Skin:  No lesions or rashes  Access:  None    Basic Metabolic Panel: Recent Labs  Lab 04/19/20 0848 04/19/20 0848 04/22/20 0436 04/22/20 1112 04/22/20 1112 04/23/20 0545 04/24/20 0508 04/25/20 0613  NA 136  --   --  135  --  134* 135 139  K 5.5*  --   --  5.0  --  5.2* 5.2* 5.4*  CL 100  --   --  96*  --  95* 97* 99  CO2 18*  --   --  19*  --  20* 22 23  GLUCOSE 143*  --   --  198*  --  186* 143* 123*  BUN 61*  --   --  95*  --  108* 95* 108*  CREATININE 3.89*  --   --  4.61*  --  5.00* 4.13* 4.15*  CALCIUM 8.2*   < >  --  7.9*   < > 8.4* 7.9* 8.0*  MG 1.9  --  2.3  --   --  2.4 2.2 2.3  PHOS 6.2*  --  8.5*  --   --  10.0* 8.6* 9.1*   < > = values in this interval not displayed.    Liver Function Tests: Recent Labs  Lab  04/19/20 0848 04/22/20 1112 04/23/20 0545 04/24/20 0508 04/25/20 0613  AST 94* 40 32 36 36  ALT 419* 113* 83* 60* 44  ALKPHOS 113 107 106 101 100  BILITOT 1.2 0.7 1.2 1.0 1.2  PROT 5.2* 5.3* 5.3* 5.0* 4.9*  ALBUMIN 1.9* 2.1* 2.1* 2.0* 1.9*   No results for input(s): LIPASE, AMYLASE in the last 168 hours. No results for input(s): AMMONIA in the last 168 hours.  CBC: Recent Labs  Lab 04/19/20 0458 04/22/20 1112 04/23/20 0545 04/24/20 0508 04/25/20 0613  WBC 24.7* 35.1* 40.1* 33.3* 34.1*  NEUTROABS  --  32.0*  --   --   --   HGB 11.5* 11.4* 11.1* 10.1* 10.5*  HCT 31.3* 32.9* 31.1* 29.8* 30.9*  MCV 82.2 87.7 85.9 91.1 89.6  PLT 36* 130* 194 179 206    Cardiac Enzymes: No results for input(s): CKTOTAL, CKMB, CKMBINDEX, TROPONINI in the last 168 hours.  BNP: Invalid input(s): POCBNP  CBG: No results for input(s): GLUCAP in the last 168 hours.  Microbiology: Results for orders placed or performed during the hospital encounter of 04/14/20  SARS Coronavirus 2 by RT PCR (hospital order, performed in [facility] hospital lab) Nasopharyngeal Nasopharyngeal Swab     Status: None   Collection Time: 04/14/20  2:17 AM   Specimen: Nasopharyngeal Swab  Result Value Ref Range Status   SARS Coronavirus 2 NEGATIVE NEGATIVE Final    Comment: (NOTE) SARS-CoV-2 target nucleic acids are NOT DETECTED.  The SARS-CoV-2 RNA is generally detectable in upper and lower respiratory specimens during the acute phase of infection. The lowest concentration of SARS-CoV-2 viral copies this assay can detect is 250 copies / mL. A negative result does not preclude SARS-CoV-2 infection and should not be used as the sole basis for treatment or other patient management decisions.  A negative result may occur with improper specimen collection / handling, submission of specimen other than nasopharyngeal swab, presence of viral mutation(s) within the areas targeted by this assay, and inadequate number of  viral copies (<250 copies / mL). A negative result must be combined with clinical observations, patient history, and epidemiological information.  Fact Sheet for Patients:   StrictlyIdeas.no  Fact Sheet for Healthcare Providers: BankingDealers.co.za  This test is not yet approved or  cleared by the [place] FDA and has been authorized for detection and/or diagnosis of SARS-CoV-2 by FDA under an Emergency Use Authorization (EUA).  This EUA will remain in effect (meaning this test can be used) for the duration of the COVID-19 declaration under Section 564(b)(1) of the Act, 21 U.S.C. section 360bbb-3(b)(1), unless the authorization is terminated or revoked sooner.  Performed at [facility], [address] [place], [place] [address]   CULTURE, BLOOD (ROUTINE X 2) w Reflex to ID Panel     Status: None   Collection Time: 04/14/20  8:14 AM   Specimen: BLOOD  Result Value Ref Range Status   Specimen Description BLOOD LEFT [facility]  Final   Special Requests   Final    BOTTLES DRAWN AEROBIC AND ANAEROBIC Blood Culture adequate volume   Culture   Final    NO GROWTH 5 DAYS Performed at [facility], [address] [place], [place] [address]    Report Status 04/19/2020 FINAL  Final  CULTURE, BLOOD (ROUTINE X 2) w Reflex to ID Panel     Status: None   Collection Time: 04/14/20  8:14 AM   Specimen: BLOOD  Result Value Ref Range Status   Specimen Description BLOOD LEFT WRIST  Final   Special Requests   Final    BOTTLES DRAWN AEROBIC AND ANAEROBIC Blood Culture adequate volume   Culture   Final    NO GROWTH 5 DAYS Performed at [facility], [address] [place], [place] [address]    Report Status 04/19/2020 FINAL  Final  Aerobic/Anaerobic Culture (surgical/deep wound)     Status: None   Collection Time: 04/14/20 11:14 AM   Specimen: Abscess  Result Value Ref Range Status   Specimen Description   Final     ABSCESS Performed at [facility], [address], [place], [place] [address]    Special Requests   Final    NONE Performed at [facility], [place], [place] [address]    Gram Stain   Final    NO WBC SEEN ABUNDANT GRAM NEGATIVE RODS FEW GRAM POSITIVE COCCI FEW GRAM POSITIVE RODS Performed at [facility], [place] [address], [place],  [address]  Culture   Final    ABUNDANT KLEBSIELLA PNEUMONIAE ABUNDANT PSEUDOMONAS AERUGINOSA MODERATE ESCHERICHIA COLI Confirmed Extended Spectrum Beta-Lactamase Producer (ESBL).  In bloodstream infections from ESBL organisms, carbapenems are preferred over piperacillin/tazobactam. They are shown to have a lower risk of mortality. MIXED ANAEROBIC FLORA PRESENT.  CALL LAB IF FURTHER IID REQUIRED.    Report Status 04/20/2020 FINAL  Final   Organism ID, Bacteria KLEBSIELLA PNEUMONIAE  Final   Organism ID, Bacteria PSEUDOMONAS AERUGINOSA  Final   Organism ID, Bacteria ESCHERICHIA COLI  Final      Susceptibility   Escherichia coli - MIC*    AMPICILLIN >=32 RESISTANT Resistant     CEFAZOLIN >=64 RESISTANT Resistant     CEFEPIME 16 RESISTANT Resistant     CEFTAZIDIME RESISTANT Resistant     CEFTRIAXONE >=64 RESISTANT Resistant     CIPROFLOXACIN >=4 RESISTANT Resistant     GENTAMICIN <=1 SENSITIVE Sensitive     IMIPENEM <=0.25 SENSITIVE Sensitive     TRIMETH/SULFA >=320 RESISTANT Resistant     AMPICILLIN/SULBACTAM >=32 RESISTANT Resistant     PIP/TAZO 8 SENSITIVE Sensitive     * MODERATE ESCHERICHIA COLI   Klebsiella pneumoniae - MIC*    AMPICILLIN >=32 RESISTANT Resistant     CEFAZOLIN <=4 SENSITIVE Sensitive     CEFEPIME <=0.12 SENSITIVE Sensitive     CEFTAZIDIME <=1 SENSITIVE Sensitive     CEFTRIAXONE <=0.25 SENSITIVE Sensitive     CIPROFLOXACIN <=0.25 SENSITIVE Sensitive     GENTAMICIN <=1 SENSITIVE Sensitive     IMIPENEM <=0.25 SENSITIVE Sensitive     TRIMETH/SULFA <=20 SENSITIVE Sensitive      AMPICILLIN/SULBACTAM 16 INTERMEDIATE Intermediate     PIP/TAZO 8 SENSITIVE Sensitive     * ABUNDANT KLEBSIELLA PNEUMONIAE   Pseudomonas aeruginosa - MIC*    CEFTAZIDIME 4 SENSITIVE Sensitive     CIPROFLOXACIN <=0.25 SENSITIVE Sensitive     GENTAMICIN 2 SENSITIVE Sensitive     IMIPENEM 2 SENSITIVE Sensitive     PIP/TAZO 8 SENSITIVE Sensitive     CEFEPIME 4 SENSITIVE Sensitive     * ABUNDANT PSEUDOMONAS AERUGINOSA  MRSA PCR Screening     Status: None   Collection Time: 04/14/20  6:19 PM   Specimen: Nasopharyngeal  Result Value Ref Range Status   MRSA by PCR NEGATIVE NEGATIVE Final    Comment:        The GeneXpert MRSA Assay (FDA approved for NASAL specimens only), is one component of a comprehensive MRSA colonization surveillance program. It is not intended to diagnose MRSA infection nor to guide or monitor treatment for MRSA infections. Performed at [facility], [address], [place], [place] [address]   Urine Culture     Status: None   Collection Time: 04/15/20  4:28 AM   Specimen: Urine, Random  Result Value Ref Range Status   Specimen Description   Final    URINE, RANDOM Performed at [facility], [address], [place], [place] [address]    Special Requests   Final    NONE Performed at [facility], [address], [place], [place] [address]    Culture   Final    NO GROWTH Performed at [facility], [place] [address], [place], [place] [address]    Report Status 04/16/2020 FINAL  Final  CULTURE, BLOOD (ROUTINE X 2) w Reflex to ID Panel     Status: None (Preliminary result)   Collection Time: 04/22/20  6:12 PM   Specimen: BLOOD  Result  Value Ref Range Status   Specimen Description BLOOD FOOT  Final   Special Requests   Final    BOTTLES DRAWN AEROBIC AND ANAEROBIC Blood Culture adequate volume   Culture   Final    NO GROWTH 3 DAYS Performed at [facility], [address] [place], [place] [address]    Report  Status PENDING  Incomplete  CULTURE, BLOOD (ROUTINE X 2) w Reflex to ID Panel     Status: None (Preliminary result)   Collection Time: 04/22/20  6:17 PM   Specimen: BLOOD  Result Value Ref Range Status   Specimen Description BLOOD FOOT  Final   Special Requests   Final    BOTTLES DRAWN AEROBIC AND ANAEROBIC Blood Culture results may not be optimal due to an excessive volume of blood received in culture bottles   Culture   Final    NO GROWTH 3 DAYS Performed at [facility], [address] [place], [place] [address]    Report Status PENDING  Incomplete    Coagulation Studies: No results for input(s): LABPROT, INR in the last 72 hours.  Urinalysis: No results for input(s): COLORURINE, LABSPEC, PHURINE, GLUCOSEU, HGBUR, BILIRUBINUR, KETONESUR, PROTEINUR, UROBILINOGEN, NITRITE, LEUKOCYTESUR in the last 72 hours.  Invalid input(s): APPERANCEUR    Imaging: No results found.   Medications:   . sodium chloride 250 mL (04/24/20 2150)  . meropenem (MERREM) IV 500 mg (04/25/20 1153)   . acidophilus  1 capsule Oral Daily  . alteplase  2 mg Intracatheter Once  . alteplase  2 mg Intracatheter Once  . amiodarone  200 mg Oral BID  . Chlorhexidine Gluconate Cloth  6 each Topical Daily  . docusate sodium  200 mg Oral BID  . dolutegravir  50 mg Oral Daily  . famotidine  40 mg Oral Q1200  . feeding supplement (NEPRO CARB STEADY)  237 mL Oral BID BM  . guaiFENesin-dextromethorphan  10 mL Oral Q8H  . heparin injection (subcutaneous)  5,000 Units Subcutaneous Q8H  . methylPREDNISolone (SOLU-MEDROL) injection  40 mg Intravenous Q24H  . mometasone-formoterol  2 puff Inhalation BID  . multivitamin  1 tablet Oral QHS  . pregabalin  25 mg Oral Daily  . rilpivirine  25 mg Oral Q breakfast  . sodium chloride flush  5 mL Intracatheter Q8H  . sodium zirconium cyclosilicate  10 g Oral Daily   acetaminophen **OR** acetaminophen, albuterol, ALPRAZolam, bisacodyl, heparin NICU/SCN flush,  HYDROcodone-acetaminophen, HYDROmorphone (DILAUDID) injection, ipratropium-albuterol, labetalol, magic mouthwash **AND** lidocaine, magnesium hydroxide, menthol-cetylpyridinium, phenol, simethicone  Assessment/ Plan:  59 y.o. female with past medical history of HIV, COPD, hypertension who was admitted with shortness of breath, colonic diverticular abscess, diverticulitis and subsequently found to have acute kidney injury.  1.  Acute kidney injury secondary to ATN with multiple factors from severe sepsis and multiple IV contrast exposures.   Urine output did improve to 1.1 L over the preceding 24 hours.  However BUN and creatinine remain quite high at 108 and 4.15 respectively.  eGFR currently 13.  We are planning for PermCath placement today.  Temporary dialysis catheter was removed from the groin however WBC count remains quite high at 34.1.  Defer further infection work-up to infectious disease.  2.  Hyperkalemia.   Potassium a bit higher today at 5.4.  Should improve with dialysis treatment.  Continue sodium zirconium.  3.  Metabolic acidosis. Improved significantly with dialysis treatments.  Serum bicarbonate currently 23.    LOS: 11 Melanie Bowen 8/27/202112:04 PM

## 2020-04-25 NOTE — Progress Notes (Signed)
OT Cancellation Note  Patient Details Name: Melanie Bowen MRN: [id number] DOB: 23-Aug-1961   Cancelled Treatment:    Reason Eval/Treat Not Completed: Patient at procedure or test/ unavailable  Pt OTF for tunneled dialysis catheter placement to R IJ. Will f/u for OT tx at later date/time as pt available. Thank you.  Gerrianne Scale, [place], OTR/L ascom [phone] 04/25/20, 3:04 PM

## 2020-04-25 NOTE — Progress Notes (Signed)
Attempted to get patient up to the bedside commode to urinate via the sit to stand lift. While sitting on the side of the bed attached to lift patient refuses to be moved to the bedside commode. Educated the patient about the importance of mobility and having to the insert a foley if she is not willing to use the commode to void. Patient refuses to engage in self care. Patient will ask staff to feed her while using her hands to talk on the phone. Patient is able to grab items from her side table. Patient will not assist with repositioning.

## 2020-04-25 NOTE — Progress Notes (Signed)
Received report from [place]. Patient back to room from specials. Perm cath dressing is clean, dry and intact. No complaints of pain. Patient given phone to place dinner order.

## 2020-04-25 NOTE — Progress Notes (Signed)
Patient accepted at [facility] MWF 11:20am. Patient can start Wednesday 9/1 at 10:30am. Please contact me with any other dialysis placement concerns.  Elvera Bicker Dialysis Coordinator [phone]

## 2020-04-25 NOTE — Progress Notes (Signed)
[place] INFECTIOUS DISEASE PROGRESS NOTE Date of Admission:  04/14/2020     ID: Melanie Bowen is a 59 y.o. female with sepsis Principal Problem:   Severe sepsis with septic shock ([name]) Active Problems:   HTN (hypertension)   HIV (human immunodeficiency virus infection) ([place])   Diverticulitis of colon   Abscess of sigmoid colon due to diverticulitis   Acute renal failure (ARF) ([place])   Hypotension   Diverticulosis   Acute respiratory distress   DNR (do not resuscitate) discussion   Palliative care by specialist   Dyspnea   Subjective: No fevers, wbc down to 34. HD cath inserted today. Pigtail cath continues with thick purulent drainage.   ROS  Eleven systems are reviewed and negative except per hpi  Medications:  Antibiotics Given (last 72 hours)    Date/Time Action Medication Dose Rate   04/22/20 2313 New Bag/Given   meropenem (MERREM) 1 g in sodium chloride 0.9 % 100 mL IVPB 1 g 200 mL/hr   04/23/20 2145 New Bag/Given   meropenem (MERREM) 500 mg in sodium chloride 0.9 % 100 mL IVPB 500 mg 200 mL/hr   04/24/20 1539 Given   dolutegravir (TIVICAY) tablet 50 mg 50 mg    04/24/20 2154 New Bag/Given   meropenem (MERREM) 500 mg in sodium chloride 0.9 % 100 mL IVPB 500 mg 200 mL/hr   04/25/20 0920 Given   dolutegravir (TIVICAY) tablet 50 mg 50 mg    04/25/20 1153 New Bag/Given   meropenem (MERREM) 500 mg in sodium chloride 0.9 % 100 mL IVPB 500 mg 200 mL/hr     . acidophilus  1 capsule Oral Daily  . alteplase  2 mg Intracatheter Once  . alteplase  2 mg Intracatheter Once  . amiodarone  200 mg Oral BID  . Chlorhexidine Gluconate Cloth  6 each Topical Daily  . docusate sodium  200 mg Oral BID  . dolutegravir  50 mg Oral Daily  . famotidine  40 mg Oral Q1200  . feeding supplement (NEPRO CARB STEADY)  237 mL Oral BID BM  . guaiFENesin-dextromethorphan  10 mL Oral Q8H  . heparin injection (subcutaneous)  5,000 Units Subcutaneous Q8H  . mometasone-formoterol  2 puff  Inhalation BID  . multivitamin  1 tablet Oral QHS  . pregabalin  25 mg Oral Daily  . rilpivirine  25 mg Oral Q breakfast  . sodium chloride flush  5 mL Intracatheter Q8H  . sodium zirconium cyclosilicate  10 g Oral Daily    Objective: Vital signs in last 24 hours: Temp:  [97.9 F (36.6 C)-98.5 F (36.9 C)] 98.4 F (36.9 C) (08/27 1534) Pulse Rate:  [93-108] 98 (08/27 1534) Resp:  [16-23] 20 (08/27 1534) BP: (110-146)/(63-90) 141/78 (08/27 1534) SpO2:  [91 %-100 %] 98 % (08/27 1534) Weight:  [133.1 kg] 133.1 kg (08/27 0508) General: Alert, cooperative, no distress, Lungs: b/l air entry Heart: Regular rate and rhythm, no murmur, rub or gallop. Abdomen: Soft, non-tender,not distended. Drain present left lower quadrant Extremities: atraumatic, no cyanosis. No edema. No clubbing Skin: No rashes or lesions. Or bruising Lymph: Cervical, supraclavicular normal. Neurologic: Grossly non-focal   Lab Results Recent Labs    04/24/20 0508 04/25/20 0613  WBC 33.3* 34.1*  HGB 10.1* 10.5*  HCT 29.8* 30.9*  NA 135 139  K 5.2* 5.4*  CL 97* 99  CO2 22 23  BUN 95* 108*  CREATININE 4.13* 4.15*    Microbiology: Results for orders placed or performed during the hospital  encounter of 04/14/20  SARS Coronavirus 2 by RT PCR (hospital order, performed in [facility] hospital lab) Nasopharyngeal Nasopharyngeal Swab     Status: None   Collection Time: 04/14/20  2:17 AM   Specimen: Nasopharyngeal Swab  Result Value Ref Range Status   SARS Coronavirus 2 NEGATIVE NEGATIVE Final    Comment: (NOTE) SARS-CoV-2 target nucleic acids are NOT DETECTED.  The SARS-CoV-2 RNA is generally detectable in upper and lower respiratory specimens during the acute phase of infection. The lowest concentration of SARS-CoV-2 viral copies this assay can detect is 250 copies / mL. A negative result does not preclude SARS-CoV-2 infection and should not be used as the sole basis for treatment or other patient  management decisions.  A negative result may occur with improper specimen collection / handling, submission of specimen other than nasopharyngeal swab, presence of viral mutation(s) within the areas targeted by this assay, and inadequate number of viral copies (<250 copies / mL). A negative result must be combined with clinical observations, patient history, and epidemiological information.  Fact Sheet for Patients:   StrictlyIdeas.no  Fact Sheet for Healthcare Providers: BankingDealers.co.za  This test is not yet approved or  cleared by the [place] FDA and has been authorized for detection and/or diagnosis of SARS-CoV-2 by FDA under an Emergency Use Authorization (EUA).  This EUA will remain in effect (meaning this test can be used) for the duration of the COVID-19 declaration under Section 564(b)(1) of the Act, 21 U.S.C. section 360bbb-3(b)(1), unless the authorization is terminated or revoked sooner.  Performed at [facility], [address] [place], [place] [address]   CULTURE, BLOOD (ROUTINE X 2) w Reflex to ID Panel     Status: None   Collection Time: 04/14/20  8:14 AM   Specimen: BLOOD  Result Value Ref Range Status   Specimen Description BLOOD LEFT [facility]  Final   Special Requests   Final    BOTTLES DRAWN AEROBIC AND ANAEROBIC Blood Culture adequate volume   Culture   Final    NO GROWTH 5 DAYS Performed at [facility], [address] [place], [place] [address]    Report Status 04/19/2020 FINAL  Final  CULTURE, BLOOD (ROUTINE X 2) w Reflex to ID Panel     Status: None   Collection Time: 04/14/20  8:14 AM   Specimen: BLOOD  Result Value Ref Range Status   Specimen Description BLOOD LEFT WRIST  Final   Special Requests   Final    BOTTLES DRAWN AEROBIC AND ANAEROBIC Blood Culture adequate volume   Culture   Final    NO GROWTH 5 DAYS Performed at [facility], [address],  [place], [place] [address]    Report Status 04/19/2020 FINAL  Final  Aerobic/Anaerobic Culture (surgical/deep wound)     Status: None   Collection Time: 04/14/20 11:14 AM   Specimen: Abscess  Result Value Ref Range Status   Specimen Description   Final    ABSCESS Performed at [facility], [address], [place], [place] [address]    Special Requests   Final    NONE Performed at [facility], [place], [place] [address]    Gram Stain   Final    NO WBC SEEN ABUNDANT GRAM NEGATIVE RODS FEW GRAM POSITIVE COCCI FEW GRAM POSITIVE RODS Performed at [facility], [place] [address], [place], [place] [address]    Culture   Final    ABUNDANT  KLEBSIELLA PNEUMONIAE ABUNDANT PSEUDOMONAS AERUGINOSA MODERATE ESCHERICHIA COLI Confirmed Extended Spectrum Beta-Lactamase Producer (ESBL).  In bloodstream infections from ESBL organisms, carbapenems are preferred over piperacillin/tazobactam. They are shown to have a lower risk of mortality. MIXED ANAEROBIC FLORA PRESENT.  CALL LAB IF FURTHER IID REQUIRED.    Report Status 04/20/2020 FINAL  Final   Organism ID, Bacteria KLEBSIELLA PNEUMONIAE  Final   Organism ID, Bacteria PSEUDOMONAS AERUGINOSA  Final   Organism ID, Bacteria ESCHERICHIA COLI  Final      Susceptibility   Escherichia coli - MIC*    AMPICILLIN >=32 RESISTANT Resistant     CEFAZOLIN >=64 RESISTANT Resistant     CEFEPIME 16 RESISTANT Resistant     CEFTAZIDIME RESISTANT Resistant     CEFTRIAXONE >=64 RESISTANT Resistant     CIPROFLOXACIN >=4 RESISTANT Resistant     GENTAMICIN <=1 SENSITIVE Sensitive     IMIPENEM <=0.25 SENSITIVE Sensitive     TRIMETH/SULFA >=320 RESISTANT Resistant     AMPICILLIN/SULBACTAM >=32 RESISTANT Resistant     PIP/TAZO 8 SENSITIVE Sensitive     * MODERATE ESCHERICHIA COLI   Klebsiella pneumoniae - MIC*    AMPICILLIN >=32 RESISTANT Resistant     CEFAZOLIN <=4 SENSITIVE Sensitive     CEFEPIME <=0.12 SENSITIVE Sensitive      CEFTAZIDIME <=1 SENSITIVE Sensitive     CEFTRIAXONE <=0.25 SENSITIVE Sensitive     CIPROFLOXACIN <=0.25 SENSITIVE Sensitive     GENTAMICIN <=1 SENSITIVE Sensitive     IMIPENEM <=0.25 SENSITIVE Sensitive     TRIMETH/SULFA <=20 SENSITIVE Sensitive     AMPICILLIN/SULBACTAM 16 INTERMEDIATE Intermediate     PIP/TAZO 8 SENSITIVE Sensitive     * ABUNDANT KLEBSIELLA PNEUMONIAE   Pseudomonas aeruginosa - MIC*    CEFTAZIDIME 4 SENSITIVE Sensitive     CIPROFLOXACIN <=0.25 SENSITIVE Sensitive     GENTAMICIN 2 SENSITIVE Sensitive     IMIPENEM 2 SENSITIVE Sensitive     PIP/TAZO 8 SENSITIVE Sensitive     CEFEPIME 4 SENSITIVE Sensitive     * ABUNDANT PSEUDOMONAS AERUGINOSA  MRSA PCR Screening     Status: None   Collection Time: 04/14/20  6:19 PM   Specimen: Nasopharyngeal  Result Value Ref Range Status   MRSA by PCR NEGATIVE NEGATIVE Final    Comment:        The GeneXpert MRSA Assay (FDA approved for NASAL specimens only), is one component of a comprehensive MRSA colonization surveillance program. It is not intended to diagnose MRSA infection nor to guide or monitor treatment for MRSA infections. Performed at [facility], [address] [place], [place] [address]   Urine Culture     Status: None   Collection Time: 04/15/20  4:28 AM   Specimen: Urine, Random  Result Value Ref Range Status   Specimen Description   Final    URINE, RANDOM Performed at [facility], [address] [place], [place] [address]    Special Requests   Final    NONE Performed at [facility], [address], [place],  [address]    Culture   Final    NO GROWTH Performed at [facility], [place] [address] [place],  [address]    Report Status 04/16/2020 FINAL  Final  CULTURE, BLOOD (ROUTINE X 2) w Reflex to ID Panel     Status: None (Preliminary result)   Collection Time: 04/22/20  6:12 PM   Specimen: BLOOD  Result Value Ref Range Status   Specimen  Description BLOOD FOOT  Final   Special Requests   Final    BOTTLES DRAWN AEROBIC AND ANAEROBIC Blood Culture adequate volume   Culture   Final    NO GROWTH 3 DAYS Performed at [facility], [address] [place], [place] [address]    Report Status PENDING  Incomplete  CULTURE, BLOOD (ROUTINE X 2) w Reflex to ID Panel     Status: None (Preliminary result)   Collection Time: 04/22/20  6:17 PM   Specimen: BLOOD  Result Value Ref Range Status   Specimen Description BLOOD FOOT  Final   Special Requests   Final    BOTTLES DRAWN AEROBIC AND ANAEROBIC Blood Culture results may not be optimal due to an excessive volume of blood received in culture bottles   Culture   Final    NO GROWTH 3 DAYS Performed at [facility], [address], [place],  [address]    Report Status PENDING  Incomplete    Studies/Results: PERIPHERAL VASCULAR CATHETERIZATION  Result Date: 04/25/2020 See op note   Assessment/Plan: Septic shockresolved-secondary to perforated diverticulitisand peridiverticular abscess. pseudomonas, kleb andESBLe.coli in the culture.has JP drain inserted on 04/14/20.  Onmeropenem since 04/15/20- would continue for a minimum of 2 weeks and also make sure there is source control  Fungitell could be high due to Dialysis and also had risk for candida because of bowel perf- culture neg- received anidulafungin for 7 days   Repeat CT abdomen- abscess has resolved May need repeat CT abdomen if leucocytosis persisit even after removing catheter  Severe leucocytosis- recent spike likely related to rt femoral line as it was malfunctioning . Line removed-on 03/2520- WBC down to ss from 41 Monitor- if leucocytosis persist repeat CT abdomen as still purulent drainage from pigtail cath.   Transaminitis due to shock liver resolved  AKI due to septic shock-getting dialysis  Thrombocytopenia-resolved    HIV- well controlled in jan 2021 her Vl <20  and cd4 >1000. She was getting care at [facility] and saw her provider last in June 2021  [place] [facility]. This has 4 different meds and we need to adjust the dose because of AKI. Other otion is to give a non NRTI based regime- can do Dolutegravir + prezcobix ( darunavir+ cobi) VS dolutegravir+rilpavirine Discussed with her HIV provider at [facility] count is 265 with 33% on 8/16/2021and Vl <20 Prior to that in Jan 2021 Vl < 20 and cd4 1000  Will do Dolutegravir + rilpavirine as prezcobix regimen has interactions with steroids /amiodarone No PPI while on rilpavirine Thank you very much for the consult. Will follow with you.  Leonel Ramsay   04/25/2020, 3:48 PM

## 2020-04-25 NOTE — Progress Notes (Signed)
Patient unable to urinate. Bladder scan shows 362mL. One time order placed for in and out cath. MD stated to place foley if bladder scan greater than 517mL.

## 2020-04-25 NOTE — Consult Note (Addendum)
Pharmacy Antibiotic Note  Melanie Bowen is a 59 y.o. female admitted on 04/14/2020 with intra-abdominal infection - decompensating on Zosyn.   -perforated diverticulitis and peridiverticular abscess Pharmacy has been consulted for Meropenem dosing.  Pt has had multiple sessions of HD this hospital stay per poor renal function, but I/O's show urine output of over 1060ml on 8/26 (some return of kidney function)  ID following  Plan:  Pt renal function shifting daily - will adjust dosing to 500mg  bid for now and monitor closely, following nephrology notes and plan and expectations to guide dosing.  8/16 wound cx: ABUNDANT KLEBSIELLA PNEUMONIAE  ABUNDANT PSEUDOMONAS AERUGINOSA  MODERATE ESCHERICHIA COLI  MIXED ANAEROBIC FLORA PRESENT.  (Klebsiella, E Coli, and pseudomonas sensitive to imipenem)   Height: 5\' 6"  (167.6 cm) Weight: 133.1 kg (293 lb 8 oz) IBW/kg (Calculated) : 59.3  Temp (24hrs), Avg:98.5 F (36.9 C), Min:98.4 F (36.9 C), Max:98.8 F (37.1 C)  Recent Labs  Lab 04/19/20 0458 04/19/20 0848 04/22/20 1112 04/23/20 0545 04/24/20 0508 04/25/20 0613  WBC 24.7*  --  35.1* 40.1* 33.3* 34.1*  CREATININE  --  3.89* 4.61* 5.00* 4.13* 4.15*    Estimated Creatinine Clearance: 20.7 mL/min (A) (by C-G formula based on SCr of 4.15 mg/dL (H)).    Allergies  Allergen Reactions  . Acetaminophen Nausea And Vomiting  . Ibuprofen Other (See Comments)    Reports causes bleeding  . Gabapentin Rash  . Morphine Itching and Rash  . Morphine And Related Itching  . Zanaflex  [Tizanidine] Rash    Antimicrobials this admission: Valacyclovir 8/17 x 1 Ceftriaxone/metronidazole 8/16>8/17 Zosyn 8/16 >>8/17 Levaquin x 1 8/20 Anidulafungin 8/17 >>8/24  Meropenem 8/17 >>  Thank you for allowing pharmacy to be a part of this patient's care.  Lu Duffel, PharmD Clinical Pharmacist 04/25/2020 9:26 AM

## 2020-04-26 LAB — BASIC METABOLIC PANEL
Anion gap: 11 (ref 5–15)
BUN: 66 mg/dL — ABNORMAL HIGH (ref 6–20)
CO2: 25 mmol/L (ref 22–32)
Calcium: 7.4 mg/dL — ABNORMAL LOW (ref 8.9–10.3)
Chloride: 100 mmol/L (ref 98–111)
Creatinine, Ser: 2.85 mg/dL — ABNORMAL HIGH (ref 0.44–1.00)
GFR calc Af Amer: 20 mL/min — ABNORMAL LOW (ref >60)
GFR calc non Af Amer: 17 mL/min — ABNORMAL LOW (ref >60)
Glucose, Bld: 119 mg/dL — ABNORMAL HIGH (ref 70–99)
Potassium: 4.6 mmol/L (ref 3.5–5.1)
Sodium: 136 mmol/L (ref 135–145)

## 2020-04-26 LAB — PHOSPHORUS: Phosphorus: 5.7 mg/dL — ABNORMAL HIGH (ref 2.5–4.6)

## 2020-04-26 LAB — CBC
HCT: 30.3 % — ABNORMAL LOW (ref 36.0–46.0)
Hemoglobin: 10 g/dL — ABNORMAL LOW (ref 12.0–15.0)
MCH: 30.8 pg (ref 26.0–34.0)
MCHC: 33 g/dL (ref 30.0–36.0)
MCV: 93.2 fL (ref 80.0–100.0)
Platelets: 181 10*3/uL (ref 150–400)
RBC: 3.25 MIL/uL — ABNORMAL LOW (ref 3.87–5.11)
RDW: 19.6 % — ABNORMAL HIGH (ref 11.5–15.5)
WBC: 26 10*3/uL — ABNORMAL HIGH (ref 4.0–10.5)
nRBC: 0 % (ref 0.0–0.2)

## 2020-04-26 LAB — MAGNESIUM: Magnesium: 2.1 mg/dL (ref 1.7–2.4)

## 2020-04-26 MED ORDER — SODIUM CHLORIDE 0.9 % IV SOLN
500.0000 mg | Freq: Three times a day (TID) | INTRAVENOUS | Status: DC
  Administered 2020-04-26 – 2020-04-28 (×6): 500 mg via INTRAVENOUS
  Filled 2020-04-26: qty 0.5
  Filled 2020-04-26 (×3): qty 500
  Filled 2020-04-26 (×5): qty 0.5

## 2020-04-26 MED ORDER — OXYCODONE-ACETAMINOPHEN 7.5-325 MG PO TABS
1.0000 | ORAL_TABLET | Freq: Four times a day (QID) | ORAL | Status: DC | PRN
  Administered 2020-04-26 – 2020-05-20 (×15): 1 via ORAL
  Filled 2020-04-26 (×18): qty 1

## 2020-04-26 NOTE — Progress Notes (Addendum)
Melanie NOTE    [name]  Melanie Bowen:[id number] DOB: 1960-10-24 DOA: 04/14/2020 PCP: Melanie Bowen, Melanie   Assessment & Plan:   Principal Problem:   Severe sepsis with septic shock ([place]) Active Problems:   HTN (hypertension)   HIV (human immunodeficiency virus infection) ([place])   Diverticulitis of colon   Abscess of sigmoid colon due to diverticulitis   Acute renal failure (ARF) ([place])   Hypotension   Diverticulosis   Acute respiratory distress   DNR (do not resuscitate) discussion   Palliative care by specialist   Dyspnea   Septic shock: w/ end organ failure, resolved. Secondary to diverticulosis/abscess. Continue on IV merrem. Initially patient was on BiPAP in ICU which she continued to refuse & then placed on high flow and now currently on 3L [place]. S/p IR placement of gallbladder drain. No surgical intervention currently as per gen surg.   Acute renal failure: secondary to septic shock. Continue on HD as per nephro. Will need another cath for HD today. Nephro recs apprec. D/c norco and start percocet secondary to pain from recent IJ cath placement   Gallbladder abscess: fluid aerobic/anaerobic cultures shows klebsiella pneumonia, pseudomonas aeruginosa, e. coli. Continue on IV merrem for a minimum of 2 weeks as per ID   Acute sigmoid diverticulitis and diverticular abscess: s/p CT-guided drainage by IR on 8/16. Continue on IV abxs as per ID    Hyperkalemia: WNL today. Will continue to monitir   Hypotension: resolved   Leukocytosis: trending down today. Continue on IV merrem as per ID. Possibly secondary to steroid which was d/c 04/25/20  HIV: well controlled CD4>1000 as per ID. Continue on antivirals. Management per ID   Lactic acidosis: resolved   Acute hypoxic respiratory failure: secondary to COPD exacerbation. D/c steroids. Continue on bronchodilators. Encourage incentive spirometry. Resolved  A. Fib: w/ RVR. Continue on amiodarone. Unable to tolerate  cardizem due to hypotension. Withholding anticoagulation at this time due to severe infection. Cardio recs apprec   Acute liver failure: resolved    Morbid obesity: BMI 46.6. Complicates overall care and prognosis    Metabolic encephalopathy: resolved    DVT prophylaxis: heparin Code Status: No intubation Family Communication:  Disposition Plan: likely d/c to CIR vs SNF  Status is: Inpatient  Remains inpatient appropriate because:IV treatments appropriate due to intensity of illness or inability to take PO. Will need outpatient HD spot prior to d/c    Dispo: The patient is from: Home              Anticipated d/c is to: CIR              Anticipated d/c date is: 3 days              Patient currently is not medically stable to d/c.    Consultants:   General Surgery  ID  Vascular surgery    Procedures:    Antimicrobials: merrem    Subjective: Pt c/o neck pain from recent IJ catheter for HD   Objective: Vitals:   04/25/20 2208 04/26/20 0458 04/26/20 0809 04/26/20 1150  BP:  134/89 112/69 (!) 132/59  Pulse:  (!) 109 97 90  Resp: 16  17 16   Temp:  98.5 F (36.9 C) 98.2 F (36.8 C) 98.2 F (36.8 C)  TempSrc:  Oral Oral Oral  SpO2:  98% 98% 95%  Weight:  131.3 kg    Height:        Intake/Output Summary (Last 24 hours) at  04/26/2020 1327 Last data filed at 04/26/2020 0615 Gross per 24 hour  Intake 830 ml  Output 1450 ml  Net -620 ml   Filed Weights   04/24/20 0455 04/25/20 0508 04/26/20 0458  Weight: 133.5 kg 133.1 kg 131.3 kg    Examination: General exam: Appears agitated  Respiratory system: diminished breath sounds b/l. No wheezes Cardiovascular system: S1 & S2+. No rubs, gallops or clicks.  Gastrointestinal system: abdomen is obese, soft and nontender.  Hypoactive bowel sounds heard. Central nervous system: Alert and oriented. Moves all 4 extremities Psychiatry: Judgement and insight appear normal. Agitated mood      Data Reviewed: I have  personally reviewed following labs and imaging studies  CBC: Recent Labs  Lab 04/22/20 1112 04/23/20 0545 04/24/20 0508 04/25/20 0613 04/26/20 0552  WBC 35.1* 40.1* 33.3* 34.1* 26.0*  NEUTROABS 32.0*  --   --   --   --   HGB 11.4* 11.1* 10.1* 10.5* 10.0*  HCT 32.9* 31.1* 29.8* 30.9* 30.3*  MCV 87.7 85.9 91.1 89.6 93.2  PLT 130* 194 179 206 409   Basic Metabolic Panel: Recent Labs  Lab 04/22/20 0436 04/22/20 0436 04/22/20 1112 04/23/20 0545 04/24/20 0508 04/25/20 0613 04/25/20 1750 04/26/20 0552  NA  --   --  135 134* 135 139  --  136  K  --   --  5.0 5.2* 5.2* 5.4*  --  4.6  CL  --   --  96* 95* 97* 99  --  100  CO2  --   --  19* 20* 22 23  --  25  GLUCOSE  --   --  198* 186* 143* 123*  --  119*  BUN  --   --  95* 108* 95* 108*  --  66*  CREATININE  --   --  4.61* 5.00* 4.13* 4.15*  --  2.85*  CALCIUM  --   --  7.9* 8.4* 7.9* 8.0*  --  7.4*  MG 2.3  --   --  2.4 2.2 2.3  --  2.1  PHOS 8.5*   < >  --  10.0* 8.6* 9.1* 8.9* 5.7*   < > = values in this interval not displayed.   GFR: Estimated Creatinine Clearance: 29.9 mL/min (A) (by C-G formula based on SCr of 2.85 mg/dL (H)). Liver Function Tests: Recent Labs  Lab 04/22/20 1112 04/23/20 0545 04/24/20 0508 04/25/20 0613  AST 40 32 36 36  ALT 113* 83* 60* 44  ALKPHOS 107 106 101 100  BILITOT 0.7 1.2 1.0 1.2  PROT 5.3* 5.3* 5.0* 4.9*  ALBUMIN 2.1* 2.1* 2.0* 1.9*   No results for input(s): LIPASE, AMYLASE in the last 168 hours. No results for input(s): AMMONIA in the last 168 hours. Coagulation Profile: No results for input(s): INR, PROTIME in the last 168 hours. Cardiac Enzymes: No results for input(s): CKTOTAL, CKMB, CKMBINDEX, TROPONINI in the last 168 hours. BNP (last 3 results) No results for input(s): PROBNP in the last 8760 hours. HbA1C: No results for input(s): HGBA1C in the last 72 hours. CBG: No results for input(s): GLUCAP in the last 168 hours. Lipid Profile: No results for input(s): CHOL,  HDL, LDLCALC, TRIG, CHOLHDL, LDLDIRECT in the last 72 hours. Thyroid Function Tests: No results for input(s): TSH, T4TOTAL, FREET4, T3FREE, THYROIDAB in the last 72 hours. Anemia Panel: No results for input(s): VITAMINB12, FOLATE, FERRITIN, TIBC, IRON, RETICCTPCT in the last 72 hours. Sepsis Labs: No results for input(s): PROCALCITON, LATICACIDVEN in the  last 168 hours.  Recent Results (from the past 240 hour(s))  CULTURE, BLOOD (ROUTINE X 2) w Reflex to ID Panel     Status: None (Preliminary result)   Collection Time: 04/22/20  6:12 PM   Specimen: BLOOD  Result Value Ref Range Status   Specimen Description BLOOD FOOT  Final   Special Requests   Final    BOTTLES DRAWN AEROBIC AND ANAEROBIC Blood Culture adequate volume   Culture   Final    NO GROWTH 3 DAYS Performed at [facility], [address], [place], [place] [address]    Report Status PENDING  Incomplete  CULTURE, BLOOD (ROUTINE X 2) w Reflex to ID Panel     Status: None (Preliminary result)   Collection Time: 04/22/20  6:17 PM   Specimen: BLOOD  Result Value Ref Range Status   Specimen Description BLOOD FOOT  Final   Special Requests   Final    BOTTLES DRAWN AEROBIC AND ANAEROBIC Blood Culture results may not be optimal due to an excessive volume of blood received in culture bottles   Culture   Final    NO GROWTH 3 DAYS Performed at [facility], [address] [place], [place] [address]    Report Status PENDING  Incomplete         Radiology Studies: PERIPHERAL VASCULAR CATHETERIZATION  Result Date: 04/25/2020 See op note       Scheduled Meds: . acidophilus  1 capsule Oral Daily  . alteplase  2 mg Intracatheter Once  . alteplase  2 mg Intracatheter Once  . amiodarone  200 mg Oral BID  . Chlorhexidine Gluconate Cloth  6 each Topical Daily  . docusate sodium  200 mg Oral BID  . dolutegravir  50 mg Oral Daily  . famotidine  10 mg Oral Q1200  . feeding supplement (NEPRO CARB STEADY)   237 mL Oral BID BM  . guaiFENesin-dextromethorphan  10 mL Oral Q8H  . heparin injection (subcutaneous)  5,000 Units Subcutaneous Q8H  . mometasone-formoterol  2 puff Inhalation BID  . multivitamin  1 tablet Oral QHS  . pregabalin  25 mg Oral Daily  . rilpivirine  25 mg Oral Q breakfast  . sodium chloride flush  5 mL Intracatheter Q8H  . sodium zirconium cyclosilicate  10 g Oral Daily   Continuous Infusions: . sodium chloride 20 mL/hr at 04/25/20 1230  . meropenem (MERREM) IV       LOS: 12 days    Time spent: 30 mins    Wyvonnia Dusky, Melanie [facility] Pager [phone]  If 7PM-7AM, please contact night-coverage [email or web address] 04/26/2020, 1:27 PM '

## 2020-04-26 NOTE — Consult Note (Signed)
PHARMACY NOTE:  ANTIMICROBIAL RENAL DOSAGE ADJUSTMENT  Current antimicrobial regimen includes a mismatch between antimicrobial dosage and estimated renal function.  As per policy approved by the Pharmacy & Therapeutics and Medical Executive Committees, the antimicrobial dosage will be adjusted accordingly.  Current antimicrobial dosage:  Meropenem 500 mg q12H   Indication: perforated diverticulitisand peridiverticular abscess  Renal Function:  Estimated Creatinine Clearance: 29.9 mL/min (A) (by C-G formula based on SCr of 2.85 mg/dL (H)).    Antimicrobial dosage has been changed to:  Meropenem 500 mg q8H  Additional comments: CrCl > 25 - Scr trending down. Increased frequency.    Thank you for allowing pharmacy to be a part of this patient's care.  Oswald Hillock, [facility] 04/26/2020 12:59 PM

## 2020-04-26 NOTE — Progress Notes (Signed)
Melanie Bowen  MRN: [id number]  DOB/AGE: 1961/05/25 59 y.o.  Primary Care Physician:Revelo, Elyse Jarvis, MD  Admit date: 04/14/2020  Chief Complaint:  Chief Complaint  Patient presents with  . Abdominal Pain    S-Pt presented on  04/14/2020 with  Chief Complaint  Patient presents with  . Abdominal Pain  .    Pt today feels better. Pt main comment was " I am getting stronger"   Meds . acidophilus  1 capsule Oral Daily  . alteplase  2 mg Intracatheter Once  . alteplase  2 mg Intracatheter Once  . amiodarone  200 mg Oral BID  . Chlorhexidine Gluconate Cloth  6 each Topical Daily  . docusate sodium  200 mg Oral BID  . dolutegravir  50 mg Oral Daily  . famotidine  10 mg Oral Q1200  . feeding supplement (NEPRO CARB STEADY)  237 mL Oral BID BM  . guaiFENesin-dextromethorphan  10 mL Oral Q8H  . heparin injection (subcutaneous)  5,000 Units Subcutaneous Q8H  . mometasone-formoterol  2 puff Inhalation BID  . multivitamin  1 tablet Oral QHS  . pregabalin  25 mg Oral Daily  . rilpivirine  25 mg Oral Q breakfast  . sodium chloride flush  5 mL Intracatheter Q8H  . sodium zirconium cyclosilicate  10 g Oral Daily         ION:GEXBM from the symptoms mentioned above,there are no other symptoms referable to all systems reviewed.  Physical Exam: Vital signs in last 24 hours: Temp:  [97.8 F (36.6 C)-98.6 F (37 C)] 98.2 F (36.8 C) (08/28 0809) Pulse Rate:  [62-109] 97 (08/28 0809) Resp:  [12-23] 17 (08/28 0809) BP: (106-146)/(39-90) 112/69 (08/28 0809) SpO2:  [91 %-100 %] 98 % (08/28 0809) Weight:  [131.3 kg] 131.3 kg (08/28 0458) Weight change: -1.86 kg Last BM Date: 04/25/20  Intake/Output from previous day: 08/27 0701 - 08/28 0700 In: 830 [P.O.:720; I.V.:5; IV Piggyback:100] Out: 1450 [Urine:400; Drains:50] No intake/output data recorded.   Physical Exam: General- pt is awake,alert, oriented to time place and person Resp- No acute REsp distress, CTA B/L NO  Rhonchi CVS- S1S2 regular in rate and rhythm GIT- BS+, soft, NT, ND EXT- NO LE Edema, Cyanosis Access tunneled catheter in situ  Lab Results: CBC Recent Labs    04/25/20 0613 04/26/20 0552  WBC 34.1* 26.0*  HGB 10.5* 10.0*  HCT 30.9* 30.3*  PLT 206 181    BMET Recent Labs    04/25/20 0613 04/26/20 0552  NA 139 136  K 5.4* 4.6  CL 99 100  CO2 23 25  GLUCOSE 123* 119*  BUN 108* 66*  CREATININE 4.15* 2.85*  CALCIUM 8.0* 7.4*    MICRO Recent Results (from the past 240 hour(s))  CULTURE, BLOOD (ROUTINE X 2) w Reflex to ID Panel     Status: None (Preliminary result)   Collection Time: 04/22/20  6:12 PM   Specimen: BLOOD  Result Value Ref Range Status   Specimen Description BLOOD FOOT  Final   Special Requests   Final    BOTTLES DRAWN AEROBIC AND ANAEROBIC Blood Culture adequate volume   Culture   Final    NO GROWTH 3 DAYS Performed at [facility], [address] [place], [place] [address]    Report Status PENDING  Incomplete  CULTURE, BLOOD (ROUTINE X 2) w Reflex to ID Panel     Status: None (Preliminary result)   Collection Time: 04/22/20  6:17 PM   Specimen: BLOOD  Result Value Ref Range Status   Specimen Description BLOOD FOOT  Final   Special Requests   Final    BOTTLES DRAWN AEROBIC AND ANAEROBIC Blood Culture results may not be optimal due to an excessive volume of blood received in culture bottles   Culture   Final    NO GROWTH 3 DAYS Performed at [facility], [address] [place], [place] [address]    Report Status PENDING  Incomplete      Lab Results  Component Value Date   CALCIUM 7.4 (L) 04/26/2020   PHOS 5.7 (H) 04/26/2020               Impression:   59 y.o. female with past medical history of HIV, COPD, hypertension who was admitted with shortness of breath, colonic diverticular abscess, diverticulitis and subsequently found to have acute kidney injury.   1)Renal  AKI secondary to multiple  factors Patient had AKI secondary to sepsis/multiple IV contrast exposures Patient was oliguric and required renal replacement therapy Patient had first hemodialysis on April 16, 2020 Patient later had temporary dialysis catheter removed Patient had tunneled catheter placed on April 25, 2020   2)HTN Blood pressure is at goal   3)Anemia of chronic disease  HGb at goal (9--11)   4) secondary hyperparathyroidism -CKD Mineral-Bone Disorder   Secondary Hyperparathyroidism  present.  Phosphorus is not at goal. Much better Patient had phosphorus of 9.1, now is down to 5.7  5) septic shock Patient had septic shock secondary to diverticulosis/abscess/sigmoid diverticulitis/diverticulosis abscess Patient is s/p CT-guided drainage Patient also had gallbladder abscess  Patient is currently on IV meropenem Patient is being closely followed by the primary team/ID  6) electrolytes   sodium Normonatremic   potassium Patient was hyponatremic earlier now much better    7)Acid base Co2 at goal Patient earlier had lactic acidosis Patient was acidotic earlier-now much better   8) HIV Patient continues to be on antiviral treatment Patient being closely followed by ID    Plan:    No need for renal placement therapy today   Melanie Bowen s Melanie Bowen 04/26/2020, 9:55 AM

## 2020-04-27 LAB — BASIC METABOLIC PANEL
Anion gap: 13 (ref 5–15)
BUN: 71 mg/dL — ABNORMAL HIGH (ref 6–20)
CO2: 21 mmol/L — ABNORMAL LOW (ref 22–32)
Calcium: 7.4 mg/dL — ABNORMAL LOW (ref 8.9–10.3)
Chloride: 99 mmol/L (ref 98–111)
Creatinine, Ser: 3.26 mg/dL — ABNORMAL HIGH (ref 0.44–1.00)
GFR calc Af Amer: 17 mL/min — ABNORMAL LOW (ref >60)
GFR calc non Af Amer: 15 mL/min — ABNORMAL LOW (ref >60)
Glucose, Bld: 94 mg/dL (ref 70–99)
Potassium: 4.5 mmol/L (ref 3.5–5.1)
Sodium: 133 mmol/L — ABNORMAL LOW (ref 135–145)

## 2020-04-27 LAB — CULTURE, BLOOD (ROUTINE X 2)
Culture: NO GROWTH
Culture: NO GROWTH
Special Requests: ADEQUATE

## 2020-04-27 LAB — CBC
HCT: 27.2 % — ABNORMAL LOW (ref 36.0–46.0)
Hemoglobin: 8.9 g/dL — ABNORMAL LOW (ref 12.0–15.0)
MCH: 30.9 pg (ref 26.0–34.0)
MCHC: 32.7 g/dL (ref 30.0–36.0)
MCV: 94.4 fL (ref 80.0–100.0)
Platelets: 172 10*3/uL (ref 150–400)
RBC: 2.88 MIL/uL — ABNORMAL LOW (ref 3.87–5.11)
RDW: 19.6 % — ABNORMAL HIGH (ref 11.5–15.5)
WBC: 25.3 10*3/uL — ABNORMAL HIGH (ref 4.0–10.5)
nRBC: 0 % (ref 0.0–0.2)

## 2020-04-27 LAB — MAGNESIUM: Magnesium: 2.1 mg/dL (ref 1.7–2.4)

## 2020-04-27 LAB — PHOSPHORUS: Phosphorus: 6.3 mg/dL — ABNORMAL HIGH (ref 2.5–4.6)

## 2020-04-27 NOTE — Progress Notes (Signed)
Melanie Bowen  MRN: [id number]  DOB/AGE: 05-Nov-1960 59 y.o.  Primary Care Physician:Revelo, Elyse Jarvis, MD  Admit date: 04/14/2020  Chief Complaint:  Chief Complaint  Patient presents with  . Abdominal Pain    S-Pt presented on  04/14/2020 with  Chief Complaint  Patient presents with  . Abdominal Pain  . Patient had no new physical complaints today. Patient was emotional/crying today. On further questioning patient was upset about lack of support from her siblings. I empathized with the patient   Meds . acidophilus  1 capsule Oral Daily  . alteplase  2 mg Intracatheter Once  . alteplase  2 mg Intracatheter Once  . amiodarone  200 mg Oral BID  . Chlorhexidine Gluconate Cloth  6 each Topical Daily  . docusate sodium  200 mg Oral BID  . dolutegravir  50 mg Oral Daily  . famotidine  10 mg Oral Q1200  . feeding supplement (NEPRO CARB STEADY)  237 mL Oral BID BM  . guaiFENesin-dextromethorphan  10 mL Oral Q8H  . heparin injection (subcutaneous)  5,000 Units Subcutaneous Q8H  . mometasone-formoterol  2 puff Inhalation BID  . multivitamin  1 tablet Oral QHS  . pregabalin  25 mg Oral Daily  . rilpivirine  25 mg Oral Q breakfast  . sodium chloride flush  5 mL Intracatheter Q8H  . sodium zirconium cyclosilicate  10 g Oral Daily         [id number] from the symptoms mentioned above,there are no other symptoms referable to all systems reviewed.  Physical Exam: Vital signs in last 24 hours: Temp:  [98.5 F (36.9 C)-98.8 F (37.1 C)] 98.5 F (36.9 C) (08/29 0450) Pulse Rate:  [101-109] 108 (08/29 0450) Resp:  [15-20] 20 (08/29 0450) BP: (101-125)/(63-71) 125/71 (08/29 0450) SpO2:  [93 %-98 %] 93 % (08/29 0450) Weight change:  Last BM Date: 04/27/20  Intake/Output from previous day: 08/28 0701 - 08/29 0700 In: 790 [P.O.:600; I.V.:80; IV Piggyback:100] Out: 1025 [Urine:975; Drains:50] No intake/output data recorded.   Physical Exam: General- pt is  awake,alert, oriented to time place and person Resp- No acute REsp distress, CTA B/L NO Rhonchi CVS- S1S2 regular in rate and rhythm GIT- BS+, soft, NT, ND EXT- NO LE Edema, Cyanosis Access tunneled catheter in situ  Lab Results: CBC Recent Labs    04/26/20 0552 04/27/20 0432  WBC 26.0* 25.3*  HGB 10.0* 8.9*  HCT 30.3* 27.2*  PLT 181 172    BMET Recent Labs    04/26/20 0552 04/27/20 0432  NA 136 133*  K 4.6 4.5  CL 100 99  CO2 25 21*  GLUCOSE 119* 94  BUN 66* 71*  CREATININE 2.85* 3.26*  CALCIUM 7.4* 7.4*    MICRO Recent Results (from the past 240 hour(s))  CULTURE, BLOOD (ROUTINE X 2) w Reflex to ID Panel     Status: None   Collection Time: 04/22/20  6:12 PM   Specimen: BLOOD  Result Value Ref Range Status   Specimen Description BLOOD FOOT  Final   Special Requests   Final    BOTTLES DRAWN AEROBIC AND ANAEROBIC Blood Culture adequate volume   Culture   Final    NO GROWTH 5 DAYS Performed at [facility], [address] [place], [place] [address]    Report Status 04/27/2020 FINAL  Final  CULTURE, BLOOD (ROUTINE X 2) w Reflex to ID Panel     Status: None   Collection Time: 04/22/20  6:17 PM   Specimen:  BLOOD  Result Value Ref Range Status   Specimen Description BLOOD FOOT  Final   Special Requests   Final    BOTTLES DRAWN AEROBIC AND ANAEROBIC Blood Culture results may not be optimal due to an excessive volume of blood received in culture bottles   Culture   Final    NO GROWTH 5 DAYS Performed at [facility], [address] [place], [place] [address]    Report Status 04/27/2020 FINAL  Final      Lab Results  Component Value Date   CALCIUM 7.4 (L) 04/27/2020   PHOS 6.3 (H) 04/27/2020               Impression:   59 y.o. female with past medical history of HIV, COPD, hypertension who was admitted with shortness of breath, colonic diverticular abscess, diverticulitis and subsequently found to have acute kidney  injury.   1)Renal  AKI secondary to multiple factors Patient had AKI secondary to sepsis/multiple IV contrast exposures Patient was oliguric and required renal replacement therapy Patient had first hemodialysis on April 16, 2020 Patient later had temporary dialysis catheter removed Patient had tunneled catheter placed on April 25, 2020   2)HTN Blood pressure is at goal   3)Anemia of chronic disease  HGb at goal (9--11)   4) secondary hyperparathyroidism -CKD Mineral-Bone Disorder   Secondary Hyperparathyroidism  present.  Phosphorus is not at goal. Much better Patient had phosphorus of 9.1, now is down to 5.7  5) septic shock Patient had septic shock secondary to diverticulosis/abscess/sigmoid diverticulitis/diverticulosis abscess Patient is s/p CT-guided drainage Patient also had gallbladder abscess  Patient is currently on IV meropenem Patient is being closely followed by the primary team/ID  6) electrolytes   sodium Hyponatremia Secondary to inability to get rid of free water   potassium Patient was hyperkalemic earlier now much better    7)Acid base Co2 at goal Patient earlier had lactic acidosis Patient was acidotic earlier-now much better   8) HIV Patient continues to be on antiviral treatment Patient being closely followed by ID    Plan:   We will dialyze patient tomorrow No need for renal placement therapy today   Fionna Merriott s Theador Hawthorne 04/27/2020, 12:31 PM

## 2020-04-27 NOTE — Progress Notes (Signed)
PROGRESS NOTE    Melanie Bowen  IDP:[id number] DOB: December 27, 1960 DOA: 04/14/2020 PCP: Theotis Burrow, MD   Assessment & Plan:   Principal Problem:   Severe sepsis with septic shock ([place]) Active Problems:   HTN (hypertension)   HIV (human immunodeficiency virus infection) ([place])   Diverticulitis of colon   Abscess of sigmoid colon due to diverticulitis   Acute renal failure (ARF) ([place])   Hypotension   Diverticulosis   Acute respiratory distress   DNR (do not resuscitate) discussion   Palliative care by specialist   Dyspnea   Septic shock: w/ end organ failure, resolved. Secondary to diverticulosis/abscess. Continue on IV merrem. Initially patient was on BiPAP in ICU which she continued to refuse & then placed on high flow and now currently on 3L [place]. S/p IR placement of gallbladder drain. No surgical intervention currently as per gen surg.   Acute renal failure: secondary to septic shock. Continue on HD as per nephro. Nephro recs apprec. Continue percocet secondary to pain from recent IJ cath placement. Will have HD tomorrow   Gallbladder abscess: fluid aerobic/anaerobic cultures shows klebsiella pneumonia, pseudomonas aeruginosa, e. coli. Continue on IV merrem for a minimum of 2 weeks as per ID   Acute sigmoid diverticulitis and diverticular abscess: s/p CT-guided drainage by IR on 8/16. Continue on IV abxs as per ID    Hyperkalemia: within normal limits today. Will continue to monitir   Hypotension: resolved   Leukocytosis: continues to trend down today. Continue on IV merrem as per ID. Possibly secondary to steroid use which was d/c 04/25/20  HIV: well controlled CD4>1000 as per ID. Continue on antivirals. Management per ID   Lactic acidosis: resolved   Acute hypoxic respiratory failure: secondary to COPD exacerbation. D/c steroids. Continue on bronchodilators. Encourage incentive spirometry. Resolved  A. Fib: w/ RVR. Continue on amiodarone. Unable to tolerate  cardizem due to hypotension. Withholding anticoagulation at this time due to severe infection. Cardio recs apprec   Acute liver failure: resolved    Morbid obesity: BMI 46.6. Complicates overall care and prognosis    Metabolic encephalopathy: resolved    DVT prophylaxis: heparin Code Status: No intubation Family Communication:  Disposition Plan: likely d/c to CIR vs SNF  Status is: Inpatient  Remains inpatient appropriate because:IV treatments appropriate due to intensity of illness or inability to take PO. Will need outpatient HD spot prior to d/c    Dispo: The patient is from: Home              Anticipated d/c is to: CIR              Anticipated d/c date is: 3 days              Patient currently is not medically stable to d/c.    Consultants:   General Surgery  ID  Vascular surgery    Procedures:    Antimicrobials: merrem    Subjective: Pt c/o weakness  Objective: Vitals:   04/26/20 1150 04/26/20 1657 04/26/20 2100 04/27/20 0450  BP: (!) 132/59 122/63 101/65 125/71  Pulse: 90 (!) 109 (!) 101 (!) 108  Resp: 16 18 15 20   Temp: 98.2 F (36.8 C) 98.8 F (37.1 C) 98.5 F (36.9 C) 98.5 F (36.9 C)  TempSrc: Oral Oral Oral Oral  SpO2: 95% 97% 98% 93%  Weight:      Height:        Intake/Output Summary (Last 24 hours) at 04/27/2020 0750 Last data filed  at 04/27/2020 0500 Gross per 24 hour  Intake 790 ml  Output 1025 ml  Net -235 ml   Filed Weights   04/24/20 0455 04/25/20 0508 04/26/20 0458  Weight: 133.5 kg 133.1 kg 131.3 kg    Examination: General exam:Appears calm and comfortable. Morbidly obese Respiratory system:diminished breath sounds b/l Cardiovascular system:S1 &S2+. No rubs, gallops or clicks.  Gastrointestinal system:Abdomen is obese, soft and nontender. Hypoactive bowel sounds heard. Central nervous system:Alert and oriented. Moves all 4 extremities Psychiatry:Judgement and insight appear normal. Flat mood and affect       Data Reviewed: I have personally reviewed following labs and imaging studies  CBC: Recent Labs  Lab 04/22/20 1112 04/22/20 1112 04/23/20 0545 04/24/20 0508 04/25/20 0613 04/26/20 0552 04/27/20 0432  WBC 35.1*   < > 40.1* 33.3* 34.1* 26.0* 25.3*  NEUTROABS 32.0*  --   --   --   --   --   --   HGB 11.4*   < > 11.1* 10.1* 10.5* 10.0* 8.9*  HCT 32.9*   < > 31.1* 29.8* 30.9* 30.3* 27.2*  MCV 87.7   < > 85.9 91.1 89.6 93.2 94.4  PLT 130*   < > 194 179 206 181 172   < > = values in this interval not displayed.   Basic Metabolic Panel: Recent Labs  Lab 04/23/20 0545 04/23/20 0545 04/24/20 0508 04/25/20 0613 04/25/20 1750 04/26/20 0552 04/27/20 0432  NA 134*  --  135 139  --  136 133*  K 5.2*  --  5.2* 5.4*  --  4.6 4.5  CL 95*  --  97* 99  --  100 99  CO2 20*  --  22 23  --  25 21*  GLUCOSE 186*  --  143* 123*  --  119* 94  BUN 108*  --  95* 108*  --  66* 71*  CREATININE 5.00*  --  4.13* 4.15*  --  2.85* 3.26*  CALCIUM 8.4*  --  7.9* 8.0*  --  7.4* 7.4*  MG 2.4  --  2.2 2.3  --  2.1 2.1  PHOS 10.0*   < > 8.6* 9.1* 8.9* 5.7* 6.3*   < > = values in this interval not displayed.   GFR: Estimated Creatinine Clearance: 26.2 mL/min (A) (by C-G formula based on SCr of 3.26 mg/dL (H)). Liver Function Tests: Recent Labs  Lab 04/22/20 1112 04/23/20 0545 04/24/20 0508 04/25/20 0613  AST 40 32 36 36  ALT 113* 83* 60* 44  ALKPHOS 107 106 101 100  BILITOT 0.7 1.2 1.0 1.2  PROT 5.3* 5.3* 5.0* 4.9*  ALBUMIN 2.1* 2.1* 2.0* 1.9*   No results for input(s): LIPASE, AMYLASE in the last 168 hours. No results for input(s): AMMONIA in the last 168 hours. Coagulation Profile: No results for input(s): INR, PROTIME in the last 168 hours. Cardiac Enzymes: No results for input(s): CKTOTAL, CKMB, CKMBINDEX, TROPONINI in the last 168 hours. BNP (last 3 results) No results for input(s): PROBNP in the last 8760 hours. HbA1C: No results for input(s): HGBA1C in the last 72  hours. CBG: No results for input(s): GLUCAP in the last 168 hours. Lipid Profile: No results for input(s): CHOL, HDL, LDLCALC, TRIG, CHOLHDL, LDLDIRECT in the last 72 hours. Thyroid Function Tests: No results for input(s): TSH, T4TOTAL, FREET4, T3FREE, THYROIDAB in the last 72 hours. Anemia Panel: No results for input(s): VITAMINB12, FOLATE, FERRITIN, TIBC, IRON, RETICCTPCT in the last 72 hours. Sepsis Labs: No results for input(s):  PROCALCITON, LATICACIDVEN in the last 168 hours.  Recent Results (from the past 240 hour(s))  CULTURE, BLOOD (ROUTINE X 2) w Reflex to ID Panel     Status: None   Collection Time: 04/22/20  6:12 PM   Specimen: BLOOD  Result Value Ref Range Status   Specimen Description BLOOD FOOT  Final   Special Requests   Final    BOTTLES DRAWN AEROBIC AND ANAEROBIC Blood Culture adequate volume   Culture   Final    NO GROWTH 5 DAYS Performed at [facility], [address] [place], [place] [address]    Report Status 04/27/2020 FINAL  Final  CULTURE, BLOOD (ROUTINE X 2) w Reflex to ID Panel     Status: None   Collection Time: 04/22/20  6:17 PM   Specimen: BLOOD  Result Value Ref Range Status   Specimen Description BLOOD FOOT  Final   Special Requests   Final    BOTTLES DRAWN AEROBIC AND ANAEROBIC Blood Culture results may not be optimal due to an excessive volume of blood received in culture bottles   Culture   Final    NO GROWTH 5 DAYS Performed at [facility], [address], [place], [place] [address]    Report Status 04/27/2020 FINAL  Final         Radiology Studies: PERIPHERAL VASCULAR CATHETERIZATION  Result Date: 04/25/2020 See op note       Scheduled Meds: . acidophilus  1 capsule Oral Daily  . alteplase  2 mg Intracatheter Once  . alteplase  2 mg Intracatheter Once  . amiodarone  200 mg Oral BID  . Chlorhexidine Gluconate Cloth  6 each Topical Daily  . docusate sodium  200 mg Oral BID  . dolutegravir  50 mg Oral  Daily  . famotidine  10 mg Oral Q1200  . feeding supplement (NEPRO CARB STEADY)  237 mL Oral BID BM  . guaiFENesin-dextromethorphan  10 mL Oral Q8H  . heparin injection (subcutaneous)  5,000 Units Subcutaneous Q8H  . mometasone-formoterol  2 puff Inhalation BID  . multivitamin  1 tablet Oral QHS  . pregabalin  25 mg Oral Daily  . rilpivirine  25 mg Oral Q breakfast  . sodium chloride flush  5 mL Intracatheter Q8H  . sodium zirconium cyclosilicate  10 g Oral Daily   Continuous Infusions: . sodium chloride 20 mL/hr at 04/25/20 1230  . meropenem (MERREM) IV 500 mg (04/27/20 0452)     LOS: 13 days    Time spent: 31 mins    Wyvonnia Dusky, MD [facility] Pager [phone]  If 7PM-7AM, please contact night-coverage [email or web address] 04/27/2020, 7:50 AM '

## 2020-04-28 ENCOUNTER — Encounter: Payer: Self-pay | Admitting: Vascular Surgery

## 2020-04-28 ENCOUNTER — Inpatient Hospital Stay: Payer: Medicare Other

## 2020-04-28 LAB — CBC
HCT: 27.2 % — ABNORMAL LOW (ref 36.0–46.0)
Hemoglobin: 9 g/dL — ABNORMAL LOW (ref 12.0–15.0)
MCH: 30.6 pg (ref 26.0–34.0)
MCHC: 33.1 g/dL (ref 30.0–36.0)
MCV: 92.5 fL (ref 80.0–100.0)
Platelets: 193 10*3/uL (ref 150–400)
RBC: 2.94 MIL/uL — ABNORMAL LOW (ref 3.87–5.11)
RDW: 18 % — ABNORMAL HIGH (ref 11.5–15.5)
WBC: 33.1 10*3/uL — ABNORMAL HIGH (ref 4.0–10.5)
nRBC: 0 % (ref 0.0–0.2)

## 2020-04-28 LAB — BASIC METABOLIC PANEL
Anion gap: 12 (ref 5–15)
BUN: 77 mg/dL — ABNORMAL HIGH (ref 6–20)
CO2: 24 mmol/L (ref 22–32)
Calcium: 7.6 mg/dL — ABNORMAL LOW (ref 8.9–10.3)
Chloride: 98 mmol/L (ref 98–111)
Creatinine, Ser: 3.86 mg/dL — ABNORMAL HIGH (ref 0.44–1.00)
GFR calc Af Amer: 14 mL/min — ABNORMAL LOW (ref >60)
GFR calc non Af Amer: 12 mL/min — ABNORMAL LOW (ref >60)
Glucose, Bld: 107 mg/dL — ABNORMAL HIGH (ref 70–99)
Potassium: 4.5 mmol/L (ref 3.5–5.1)
Sodium: 134 mmol/L — ABNORMAL LOW (ref 135–145)

## 2020-04-28 LAB — MAGNESIUM: Magnesium: 1.7 mg/dL (ref 1.7–2.4)

## 2020-04-28 LAB — PHOSPHORUS: Phosphorus: 6.1 mg/dL — ABNORMAL HIGH (ref 2.5–4.6)

## 2020-04-28 MED ORDER — SODIUM CHLORIDE 0.9 % IV SOLN
500.0000 mg | Freq: Two times a day (BID) | INTRAVENOUS | Status: DC
  Administered 2020-04-28 – 2020-05-05 (×14): 500 mg via INTRAVENOUS
  Filled 2020-04-28 (×2): qty 500
  Filled 2020-04-28 (×4): qty 0.5
  Filled 2020-04-28 (×2): qty 500
  Filled 2020-04-28 (×4): qty 0.5
  Filled 2020-04-28: qty 500
  Filled 2020-04-28 (×3): qty 0.5
  Filled 2020-04-28: qty 500

## 2020-04-28 MED ORDER — IOHEXOL 9 MG/ML PO SOLN: 1000.0000 mL | Freq: Once | ORAL | Status: DC | PRN

## 2020-04-28 NOTE — Progress Notes (Signed)
PT Cancellation Note  Patient Details Name: Melanie Bowen MRN: [id number] DOB: [birth year]   Cancelled Treatment:    Reason Eval/Treat Not Completed: Other (comment) (Pt refuses, wants to eat first. Pt asks to do PT tomorrow insteady. Author encourages her to continue with BLE A/ROM.) As patient has not been consistently motivated to do therapies, will update recommendation to STR at DC, a therapy frequency likely to be better tolerated.   10:26 AM, 04/28/20 Etta Grandchild, PT, DPT Physical Therapist - [facility]  [phone] [facility])    .  Nole Robey C 04/28/2020, 10:25 AM

## 2020-04-28 NOTE — Progress Notes (Signed)
[place] INFECTIOUS DISEASE PROGRESS NOTE Date of Admission:  04/14/2020     ID: Melanie Bowen is a 59 y.o. female with sepsis Principal Problem:   Severe sepsis with septic shock ([place]) Active Problems:   HTN (hypertension)   HIV (human immunodeficiency virus infection) ([place])   Diverticulitis of colon   Abscess of sigmoid colon due to diverticulitis   Acute renal failure (ARF) ([place])   Hypotension   Diverticulosis   Acute respiratory distress   DNR (do not resuscitate) discussion   Palliative care by specialist   Dyspnea   Subjective: Tm 102, wbc down to 34. Hold HD today.  Pigtail cath continues with thick purulent drainage. BP down some,. Confused.  ROS  Unable to obtain. Pt confused.   Medications:  Antibiotics Given (last 72 hours)    Date/Time Action Medication Dose Rate   04/25/20 2237 New Bag/Given   meropenem (MERREM) 500 mg in sodium chloride 0.9 % 100 mL IVPB 500 mg 200 mL/hr   04/26/20 0933 Given   rilpivirine (EDURANT) tablet 25 mg 25 mg    04/26/20 0941 New Bag/Given   meropenem (MERREM) 500 mg in sodium chloride 0.9 % 100 mL IVPB 500 mg 200 mL/hr   04/26/20 0945 Given   dolutegravir (TIVICAY) tablet 50 mg 50 mg    04/26/20 2159 New Bag/Given   meropenem (MERREM) 500 mg in sodium chloride 0.9 % 100 mL IVPB 500 mg 200 mL/hr   04/27/20 0452 New Bag/Given   meropenem (MERREM) 500 mg in sodium chloride 0.9 % 100 mL IVPB 500 mg 200 mL/hr   04/27/20 0841 Given   dolutegravir (TIVICAY) tablet 50 mg 50 mg    04/27/20 0843 Given   rilpivirine (EDURANT) tablet 25 mg 25 mg    04/27/20 1454 New Bag/Given   meropenem (MERREM) 500 mg in sodium chloride 0.9 % 100 mL IVPB 500 mg 200 mL/hr   04/27/20 2316 New Bag/Given   meropenem (MERREM) 500 mg in sodium chloride 0.9 % 100 mL IVPB 500 mg 200 mL/hr   04/28/20 0538 New Bag/Given   meropenem (MERREM) 500 mg in sodium chloride 0.9 % 100 mL IVPB 500 mg 200 mL/hr   04/28/20 8250 Given   dolutegravir (TIVICAY)  tablet 50 mg 50 mg    04/28/20 1503 New Bag/Given   meropenem (MERREM) 500 mg in sodium chloride 0.9 % 100 mL IVPB 500 mg 200 mL/hr   04/28/20 1534 Given   rilpivirine (EDURANT) tablet 25 mg 25 mg      . acidophilus  1 capsule Oral Daily  . alteplase  2 mg Intracatheter Once  . alteplase  2 mg Intracatheter Once  . amiodarone  200 mg Oral BID  . Chlorhexidine Gluconate Cloth  6 each Topical Daily  . docusate sodium  200 mg Oral BID  . dolutegravir  50 mg Oral Daily  . famotidine  10 mg Oral Q1200  . feeding supplement (NEPRO CARB STEADY)  237 mL Oral BID BM  . guaiFENesin-dextromethorphan  10 mL Oral Q8H  . heparin injection (subcutaneous)  5,000 Units Subcutaneous Q8H  . mometasone-formoterol  2 puff Inhalation BID  . multivitamin  1 tablet Oral QHS  . pregabalin  25 mg Oral Daily  . rilpivirine  25 mg Oral Q breakfast  . sodium chloride flush  5 mL Intracatheter Q8H  . sodium zirconium cyclosilicate  10 g Oral Daily    Objective: Vital signs in last 24 hours: Temp:  [98 F (36.7 C)-102  F (38.9 C)] 100.3 F (37.9 C) (08/30 1615) Pulse Rate:  [99-112] 112 (08/30 1615) Resp:  [16-20] 17 (08/30 1615) BP: (106-170)/(64-84) 126/70 (08/30 1615) SpO2:  [86 %-98 %] 86 % (08/30 1615) General: Alert, cooperative, no distress, Lungs: b/l air entry Heart: Regular rate and rhythm, no murmur, rub or gallop. Abdomen: Soft, non-tender,not distended. Drain present left lower quadrant Extremities: atraumatic, no cyanosis. No edema. No clubbing Skin: No rashes or lesions. Or bruising Lymph: Cervical, supraclavicular normal. Neurologic: Grossly non-focal   Lab Results Recent Labs    04/27/20 0432 04/28/20 0622 04/28/20 0640  WBC 25.3* 33.1*  --   HGB 8.9* 9.0*  --   HCT 27.2* 27.2*  --   NA 133*  --  134*  K 4.5  --  4.5  CL 99  --  98  CO2 21*  --  24  BUN 71*  --  77*  CREATININE 3.26*  --  3.86*    Microbiology: Results for orders placed or performed during the  hospital encounter of 04/14/20  SARS Coronavirus 2 by RT PCR (hospital order, performed in [facility] hospital lab) Nasopharyngeal Nasopharyngeal Swab     Status: None   Collection Time: 04/14/20  2:17 AM   Specimen: Nasopharyngeal Swab  Result Value Ref Range Status   SARS Coronavirus 2 NEGATIVE NEGATIVE Final    Comment: (NOTE) SARS-CoV-2 target nucleic acids are NOT DETECTED.  The SARS-CoV-2 RNA is generally detectable in upper and lower respiratory specimens during the acute phase of infection. The lowest concentration of SARS-CoV-2 viral copies this assay can detect is 250 copies / mL. A negative result does not preclude SARS-CoV-2 infection and should not be used as the sole basis for treatment or other patient management decisions.  A negative result may occur with improper specimen collection / handling, submission of specimen other than nasopharyngeal swab, presence of viral mutation(s) within the areas targeted by this assay, and inadequate number of viral copies (<250 copies / mL). A negative result must be combined with clinical observations, patient history, and epidemiological information.  Fact Sheet for Patients:   StrictlyIdeas.no  Fact Sheet for Healthcare Providers: BankingDealers.co.za  This test is not yet approved or  cleared by the [place] FDA and has been authorized for detection and/or diagnosis of SARS-CoV-2 by FDA under an Emergency Use Authorization (EUA).  This EUA will remain in effect (meaning this test can be used) for the duration of the COVID-19 declaration under Section 564(b)(1) of the Act, 21 U.S.C. section 360bbb-3(b)(1), unless the authorization is terminated or revoked sooner.  Performed at [facility], [address] [place], [place] [address]   CULTURE, BLOOD (ROUTINE X 2) w Reflex to ID Panel     Status: None   Collection Time: 04/14/20  8:14 AM   Specimen: BLOOD   Result Value Ref Range Status   Specimen Description BLOOD LEFT [facility]  Final   Special Requests   Final    BOTTLES DRAWN AEROBIC AND ANAEROBIC Blood Culture adequate volume   Culture   Final    NO GROWTH 5 DAYS Performed at [facility], [address] [place],  [address]    Report Status 04/19/2020 FINAL  Final  CULTURE, BLOOD (ROUTINE X 2) w Reflex to ID Panel     Status: None   Collection Time: 04/14/20  8:14 AM   Specimen: BLOOD  Result Value Ref Range Status   Specimen Description BLOOD LEFT WRIST  Final  Special Requests   Final    BOTTLES DRAWN AEROBIC AND ANAEROBIC Blood Culture adequate volume   Culture   Final    NO GROWTH 5 DAYS Performed at [facility], [address] [place], [place] [address]    Report Status 04/19/2020 FINAL  Final  Aerobic/Anaerobic Culture (surgical/deep wound)     Status: None   Collection Time: 04/14/20 11:14 AM   Specimen: Abscess  Result Value Ref Range Status   Specimen Description   Final    ABSCESS Performed at [facility], [address], [place], [place] [address]    Special Requests   Final    NONE Performed at [facility], [place], [place] [address]    Gram Stain   Final    NO WBC SEEN ABUNDANT GRAM NEGATIVE RODS FEW GRAM POSITIVE COCCI FEW GRAM POSITIVE RODS Performed at [facility], [place] [address] [place], [place] [address]    Culture   Final    ABUNDANT KLEBSIELLA PNEUMONIAE ABUNDANT PSEUDOMONAS AERUGINOSA MODERATE ESCHERICHIA COLI Confirmed Extended Spectrum Beta-Lactamase Producer (ESBL).  In bloodstream infections from ESBL organisms, carbapenems are preferred over piperacillin/tazobactam. They are shown to have a lower risk of mortality. MIXED ANAEROBIC FLORA PRESENT.  CALL LAB IF FURTHER IID REQUIRED.    Report Status 04/20/2020 FINAL  Final   Organism ID, Bacteria KLEBSIELLA PNEUMONIAE  Final   Organism ID, Bacteria PSEUDOMONAS AERUGINOSA   Final   Organism ID, Bacteria ESCHERICHIA COLI  Final      Susceptibility   Escherichia coli - MIC*    AMPICILLIN >=32 RESISTANT Resistant     CEFAZOLIN >=64 RESISTANT Resistant     CEFEPIME 16 RESISTANT Resistant     CEFTAZIDIME RESISTANT Resistant     CEFTRIAXONE >=64 RESISTANT Resistant     CIPROFLOXACIN >=4 RESISTANT Resistant     GENTAMICIN <=1 SENSITIVE Sensitive     IMIPENEM <=0.25 SENSITIVE Sensitive     TRIMETH/SULFA >=320 RESISTANT Resistant     AMPICILLIN/SULBACTAM >=32 RESISTANT Resistant     PIP/TAZO 8 SENSITIVE Sensitive     * MODERATE ESCHERICHIA COLI   Klebsiella pneumoniae - MIC*    AMPICILLIN >=32 RESISTANT Resistant     CEFAZOLIN <=4 SENSITIVE Sensitive     CEFEPIME <=0.12 SENSITIVE Sensitive     CEFTAZIDIME <=1 SENSITIVE Sensitive     CEFTRIAXONE <=0.25 SENSITIVE Sensitive     CIPROFLOXACIN <=0.25 SENSITIVE Sensitive     GENTAMICIN <=1 SENSITIVE Sensitive     IMIPENEM <=0.25 SENSITIVE Sensitive     TRIMETH/SULFA <=20 SENSITIVE Sensitive     AMPICILLIN/SULBACTAM 16 INTERMEDIATE Intermediate     PIP/TAZO 8 SENSITIVE Sensitive     * ABUNDANT KLEBSIELLA PNEUMONIAE   Pseudomonas aeruginosa - MIC*    CEFTAZIDIME 4 SENSITIVE Sensitive     CIPROFLOXACIN <=0.25 SENSITIVE Sensitive     GENTAMICIN 2 SENSITIVE Sensitive     IMIPENEM 2 SENSITIVE Sensitive     PIP/TAZO 8 SENSITIVE Sensitive     CEFEPIME 4 SENSITIVE Sensitive     * ABUNDANT PSEUDOMONAS AERUGINOSA  MRSA PCR Screening     Status: None   Collection Time: 04/14/20  6:19 PM   Specimen: Nasopharyngeal  Result Value Ref Range Status   MRSA by PCR NEGATIVE NEGATIVE Final    Comment:        The GeneXpert MRSA Assay (FDA approved for NASAL specimens only), is one component of a comprehensive MRSA colonization surveillance program. It is not intended to  diagnose MRSA infection nor to guide or monitor treatment for MRSA infections. Performed at [facility], [address] [place], [place]  [address]   Urine Culture     Status: None   Collection Time: 04/15/20  4:28 AM   Specimen: Urine, Random  Result Value Ref Range Status   Specimen Description   Final    URINE, RANDOM Performed at [facility], [address] [place], [place] [address]    Special Requests   Final    NONE Performed at [facility], [address] [place], [place] [address]    Culture   Final    NO GROWTH Performed at [facility], [place] [address] [place], [place] [address]    Report Status 04/16/2020 FINAL  Final  CULTURE, BLOOD (ROUTINE X 2) w Reflex to ID Panel     Status: None   Collection Time: 04/22/20  6:12 PM   Specimen: BLOOD  Result Value Ref Range Status   Specimen Description BLOOD FOOT  Final   Special Requests   Final    BOTTLES DRAWN AEROBIC AND ANAEROBIC Blood Culture adequate volume   Culture   Final    NO GROWTH 5 DAYS Performed at [facility], [address] [place], [place] [address]    Report Status 04/27/2020 FINAL  Final  CULTURE, BLOOD (ROUTINE X 2) w Reflex to ID Panel     Status: None   Collection Time: 04/22/20  6:17 PM   Specimen: BLOOD  Result Value Ref Range Status   Specimen Description BLOOD FOOT  Final   Special Requests   Final    BOTTLES DRAWN AEROBIC AND ANAEROBIC Blood Culture results may not be optimal due to an excessive volume of blood received in culture bottles   Culture   Final    NO GROWTH 5 DAYS Performed at [facility], [address], [place],  [address]    Report Status 04/27/2020 FINAL  Final    Studies/Results: DG Chest Port 1 View  Result Date: 04/28/2020 CLINICAL DATA:  Sepsis. EXAM: PORTABLE CHEST 1 VIEW COMPARISON:  April 14, 2020. FINDINGS: The heart size and mediastinal contours are within normal limits. Both lungs are clear. No pneumothorax or pleural effusion is noted. Interval placement of right internal jugular catheter with distal tip in expected position of the SVC. The  visualized skeletal structures are unremarkable. IMPRESSION: No active disease. Electronically Signed   By: Marijo Conception M.D.   On: 04/28/2020 13:08    Assessment/Plan: Septic shockresolved-secondary to perforated diverticulitisand peridiverticular abscess. pseudomonas, kleb andESBLe.coli in the culture.has JP drain inserted on 04/14/20.  Onmeropenem since 04/15/20- would continue for a minimum of 2 weeks and also make sure there is source control  Check CT with new fever today and increasing wbc -  Transaminitis due to shock liver resolved  AKI due to septic shock-getting dialysis  Thrombocytopenia-resolved  HIV- well controlled in jan 2021 her Vl <20 and cd4 >1000. She was getting care at [facility] and saw her provider last in June 2021  [place] hold. This has 4 different meds and we need to adjust the dose because of AKI. Will do Dolutegravir + rilpavirine as prezcobix regimen has interactions with steroids /amiodarone No PPI while on rilpavirine Thank you very much for the consult. Will follow with you.  Leonel Ramsay   04/28/2020, 4:22 PM

## 2020-04-28 NOTE — Plan of Care (Signed)

## 2020-04-28 NOTE — Consult Note (Signed)
PHARMACY NOTE:  ANTIMICROBIAL RENAL DOSAGE ADJUSTMENT  Current antimicrobial regimen includes a mismatch between antimicrobial dosage and estimated renal function.  As per policy approved by the Pharmacy & Therapeutics and Medical Executive Committees, the antimicrobial dosage will be adjusted accordingly.  Current antimicrobial dosage:  Meropenem 500 mg q8H   Indication: perforated diverticulitisand peridiverticular abscess  Renal Function:  Estimated Creatinine Clearance: 22.1 mL/min (A) (by C-G formula based on SCr of 3.86 mg/dL (H)).    Antimicrobial dosage has been changed to:  Meropenem 500 mg q12H  Additional comments: CrCl < 25 - Scr trending up. Decreased frequency.    Thank you for allowing pharmacy to be a part of this patient's care.  Benn Moulder, PharmD Pharmacy Resident  04/28/2020 3:48 PM

## 2020-04-28 NOTE — Progress Notes (Signed)
Pt assessed. No acute changed noted. HR elevated. Agricultural consultant notified. Will continue to monitor per [place] [facility]  protocol

## 2020-04-28 NOTE — Progress Notes (Signed)
[facility] SURGICAL PROGRESS NOTE (cpt [phone])  Hospital Day(s): 14.   Interval History:  Patient seen and examined Overnight she had a fever with t-Max of 102 @ 0400 but she attributes this to "working hard and moving around".  Patient reports intermittent "twinges of pain" in her LLQ but otherwise this is improved She denied any CP, SOB, nausea, emesis. Fever is resolved this morning Leukocytosis is again trending up, now 33K (from 25K yesterday) BMP and electrolytes consistent with degree of renal failure, starting to make more urine On HD, tunneled catheter placed 08/27 JP drain in LLQ, output 25 ccs, seropurulent On regular diet, tolerating well, + bowel function  Review of Systems:  Constitutional: + fever, denied chills  HEENT: denies cough or congestion  Respiratory: denies any shortness of breath  Cardiovascular: denies chest pain or palpitations  Gastrointestinal: denies abdominal pain, N/V, or diarrhea/and bowel function as per interval history Genitourinary: denies burning with urination or urinary frequency Musculoskeletal: denies pain, decreased motor or sensation  Vital signs in last 24 hours: [min-max] current  Temp:  [98 F (36.7 C)-102 F (38.9 C)] 99.5 F (37.5 C) (08/30 0824) Pulse Rate:  [98-112] 110 (08/30 0824) Resp:  [16-20] 18 (08/30 0824) BP: (106-170)/(64-84) 170/79 (08/30 0824) SpO2:  [89 %-98 %] 89 % (08/30 0824)     Height: 5\' 6"  (167.6 cm) Weight: 131.3 kg BMI (Calculated): 46.73   Intake/Output last 2 shifts:  08/29 0701 - 08/30 0700 In: 0.5  Out: 825 [Urine:800; Drains:25]   Physical Exam:  Constitutional: alert, cooperative and no distress  HENT: normocephalic without obvious abnormality  Eyes: PERRL, EOM's grossly intact and symmetric  Respiratory: Breathing appears slightly more labored, on RA Cardiovascular: Tachycardic and sinus rhythm  Gastrointestinal: Soft, non-tender, and non-distended, no rebound/guarding, drain  in LLQ appears seropurulent today  Musculoskeletal: 2+ pitting edema bilateral LE   Labs:  CBC Latest Ref Rng & Units 04/28/2020 04/27/2020 04/26/2020  WBC 4.0 - 10.5 K/uL 33.1(H) 25.3(H) 26.0(H)  Hemoglobin 12.0 - 15.0 g/dL 9.0(L) 8.9(L) 10.0(L)  Hematocrit 36 - 46 % 27.2(L) 27.2(L) 30.3(L)  Platelets 150 - 400 K/uL 193 172 181   CMP Latest Ref Rng & Units 04/28/2020 04/27/2020 04/26/2020  Glucose 70 - 99 mg/dL 107(H) 94 119(H)  BUN 6 - 20 mg/dL 77(H) 71(H) 66(H)  Creatinine 0.44 - 1.00 mg/dL 3.86(H) 3.26(H) 2.85(H)  Sodium 135 - 145 mmol/L 134(L) 133(L) 136  Potassium 3.5 - 5.1 mmol/L 4.5 4.5 4.6  Chloride 98 - 111 mmol/L 98 99 100  CO2 22 - 32 mmol/L 24 21(L) 25  Calcium 8.9 - 10.3 mg/dL 7.6(L) 7.4(L) 7.4(L)  Total Protein 6.5 - 8.1 g/dL - - -  Total Bilirubin 0.3 - 1.2 mg/dL - - -  Alkaline Phos 38 - 126 U/L - - -  AST 15 - 41 U/L - - -  ALT 0 - 44 U/L - - -     Imaging studies: No new pertinent imaging studies   Assessment/Plan: (ICD-10's: K44.20) 59 y.o. female with fever to 102 overnight and bump in leukocytosis admitted withseptic shock secondary tosigmoid diverticulitis with abscess s/p percutaneous drainage on 08/16andshe continues to bewithout any evidence of peritonitis/pneumoperitoneum/pneumatosis on examination or imaging,complicated by pertinent comorbidities includingmarked COPD   - Unsure of isolated fever source, however concomitant with bump in leukocytosis I did recommend repeating CT Abdomen/Pelvis today however she adamantly refused this. I explained the importance of this and my thought process. If she develops another fever today,  she will be agreeable with proceeding. In the meantime, we should consider investigation for other potential sources with CXR, UA, DVT US? Again, no obvious etiology.     - Continue IV Abx(meropenem); ID followingand appreciate assistance; Cx withpseudomonas, kleb [email or web address]  - Monitor abdominal examination;  leukocytosis - No emergent surgical intervention - Pain control prn; antiemetics prn - Maintain JP drain; record output - Appreciate nephrology/cardiology assistance and recommendations - Continue to work with therapies; suspect she will need significant rehab - Further management per primary service; we will continue to follow   All of the above findings and recommendations were discussed with the patient, and the medical team, and all of patient's questions were answered to her expressed satisfaction.  -- Edison Simon, PA-C [facility] 04/28/2020, 8:55 AM [phone] M-F: 7am - 4pm

## 2020-04-28 NOTE — Progress Notes (Signed)
PROGRESS NOTE    WING GFELLER  OIN:[id number] DOB: [birth year] DOA: 04/14/2020 PCP: Theotis Burrow, MD   Assessment & Plan:   Principal Problem:   Severe sepsis with septic shock ([place]) Active Problems:   HTN (hypertension)   HIV (human immunodeficiency virus infection) ([place])   Diverticulitis of colon   Abscess of sigmoid colon due to diverticulitis   Acute renal failure (ARF) ([place])   Hypotension   Diverticulosis   Acute respiratory distress   DNR (do not resuscitate) discussion   Palliative care by specialist   Dyspnea   Septic shock: w/ end organ failure, resolved. Secondary to diverticulosis/abscess. Continue on IV merrem. Initially patient was on BiPAP in ICU which she continued to refuse & then placed on high flow and now currently on 3L [place]. S/p IR placement of gallbladder drain. No surgical intervention currently as per gen surg.   Acute renal failure: secondary to septic shock. Continue on HD as per nephro. Nephro recs apprec. Continue percocet secondary to pain from recent IJ cath placement. Will have HD tomorrow   Gallbladder abscess: fluid aerobic/anaerobic cultures shows klebsiella pneumonia, pseudomonas aeruginosa, e. coli. Continue on IV merrem for a minimum of 2 weeks as per ID   Acute sigmoid diverticulitis and diverticular abscess: s/p CT-guided drainage by IR on 8/16. Continue on IV abxs as per ID    Leukocytosis: trending up today, labile. Pt refused repeat CT abd/pelvis. Repeat CXR ordered. Febrile today up to 102. Tylenol prn for fevers. Continue on IV merrem as per ID. Etiology unclear, possibly secondary to steroids but steroids were d/c 04/25/20  Hyperkalemia: WNL again today. Will continue to monitir   Hypotension: resolved   HIV: well controlled CD4>1000 as per ID. Continue on antivirals. Management per ID   Lactic acidosis: resolved   Acute hypoxic respiratory failure: secondary to COPD exacerbation. D/c steroids. Continue on  bronchodilators. Encourage incentive spirometry. Resolved  A. Fib: w/ RVR. Continue on amiodarone. Unable to tolerate cardizem due to hypotension. Withholding anticoagulation at this time due to severe infection. Cardio recs apprec   Acute liver failure: resolved    Morbid obesity: BMI 46.6. Complicates overall care and prognosis    Metabolic encephalopathy: resolved    DVT prophylaxis: heparin Code Status: No intubation Family Communication:  Disposition Plan: likely d/c to CIR vs SNF  Status is: Inpatient  Remains inpatient appropriate because:IV treatments appropriate due to intensity of illness or inability to take PO. Will need outpatient HD spot prior to d/c. Spiking fevers, rising WBC count   Dispo: The patient is from: Home              Anticipated d/c is to: CIR              Anticipated d/c date is: 3 days              Patient currently is not medically stable to d/c.    Consultants:   General Surgery  ID  Vascular surgery    Procedures:    Antimicrobials: merrem    Subjective: Pt c/o intermittent lower abd pain  Objective: Vitals:   04/28/20 0554 04/28/20 0824 04/28/20 0929 04/28/20 1116  BP:  (!) 170/79  135/78  Pulse:  (!) 110  (!) 110  Resp:  18 20 18   Temp: 100.2 F (37.9 C) 99.5 F (37.5 C)  99.9 F (37.7 C)  TempSrc: Oral Oral  Oral  SpO2:  (!) 89%  (!) 87%  Weight:  Height:        Intake/Output Summary (Last 24 hours) at 04/28/2020 1148 Last data filed at 04/28/2020 0541 Gross per 24 hour  Intake 0.5 ml  Output 800 ml  Net -799.5 ml   Filed Weights   04/24/20 0455 04/25/20 0508 04/26/20 0458  Weight: 133.5 kg 133.1 kg 131.3 kg    Examination: General exam:Appears calm and comfortable Respiratory system: decreased breath sounds b/l  Cardiovascular system:S1 &S2+. No rubs, gallops or clicks.  Gastrointestinal system:abdomen is obese, soft and nontender.Normal bowel sounds heard. Central nervous system:Alert and  oriented. Moves all 4 extremities Psychiatry:Judgement and insight appear normal. Flat mood and affect       Data Reviewed: I have personally reviewed following labs and imaging studies  CBC: Recent Labs  Lab 04/22/20 1112 04/23/20 0545 04/24/20 0508 04/25/20 0613 04/26/20 0552 04/27/20 0432 04/28/20 0622  WBC 35.1*   < > 33.3* 34.1* 26.0* 25.3* 33.1*  NEUTROABS 32.0*  --   --   --   --   --   --   HGB 11.4*   < > 10.1* 10.5* 10.0* 8.9* 9.0*  HCT 32.9*   < > 29.8* 30.9* 30.3* 27.2* 27.2*  MCV 87.7   < > 91.1 89.6 93.2 94.4 92.5  PLT 130*   < > 179 206 181 172 193   < > = values in this interval not displayed.   Basic Metabolic Panel: Recent Labs  Lab 04/24/20 0508 04/24/20 0508 04/25/20 0613 04/25/20 1750 04/26/20 0552 04/27/20 0432 04/28/20 0640  NA 135  --  139  --  136 133* 134*  K 5.2*  --  5.4*  --  4.6 4.5 4.5  CL 97*  --  99  --  100 99 98  CO2 22  --  23  --  25 21* 24  GLUCOSE 143*  --  123*  --  119* 94 107*  BUN 95*  --  108*  --  66* 71* 77*  CREATININE 4.13*  --  4.15*  --  2.85* 3.26* 3.86*  CALCIUM 7.9*  --  8.0*  --  7.4* 7.4* 7.6*  MG 2.2  --  2.3  --  2.1 2.1 1.7  PHOS 8.6*   < > 9.1* 8.9* 5.7* 6.3* 6.1*   < > = values in this interval not displayed.   GFR: Estimated Creatinine Clearance: 22.1 mL/min (A) (by C-G formula based on SCr of 3.86 mg/dL (H)). Liver Function Tests: Recent Labs  Lab 04/22/20 1112 04/23/20 0545 04/24/20 0508 04/25/20 0613  AST 40 32 36 36  ALT 113* 83* 60* 44  ALKPHOS 107 106 101 100  BILITOT 0.7 1.2 1.0 1.2  PROT 5.3* 5.3* 5.0* 4.9*  ALBUMIN 2.1* 2.1* 2.0* 1.9*   No results for input(s): LIPASE, AMYLASE in the last 168 hours. No results for input(s): AMMONIA in the last 168 hours. Coagulation Profile: No results for input(s): INR, PROTIME in the last 168 hours. Cardiac Enzymes: No results for input(s): CKTOTAL, CKMB, CKMBINDEX, TROPONINI in the last 168 hours. BNP (last 3 results) No results for  input(s): PROBNP in the last 8760 hours. HbA1C: No results for input(s): HGBA1C in the last 72 hours. CBG: No results for input(s): GLUCAP in the last 168 hours. Lipid Profile: No results for input(s): CHOL, HDL, LDLCALC, TRIG, CHOLHDL, LDLDIRECT in the last 72 hours. Thyroid Function Tests: No results for input(s): TSH, T4TOTAL, FREET4, T3FREE, THYROIDAB in the last 72 hours. Anemia Panel: No results  for input(s): VITAMINB12, FOLATE, FERRITIN, TIBC, IRON, RETICCTPCT in the last 72 hours. Sepsis Labs: No results for input(s): PROCALCITON, LATICACIDVEN in the last 168 hours.  Recent Results (from the past 240 hour(s))  CULTURE, BLOOD (ROUTINE X 2) w Reflex to ID Panel     Status: None   Collection Time: 04/22/20  6:12 PM   Specimen: BLOOD  Result Value Ref Range Status   Specimen Description BLOOD FOOT  Final   Special Requests   Final    BOTTLES DRAWN AEROBIC AND ANAEROBIC Blood Culture adequate volume   Culture   Final    NO GROWTH 5 DAYS Performed at [facility], [address] [place], [place] [address]    Report Status 04/27/2020 FINAL  Final  CULTURE, BLOOD (ROUTINE X 2) w Reflex to ID Panel     Status: None   Collection Time: 04/22/20  6:17 PM   Specimen: BLOOD  Result Value Ref Range Status   Specimen Description BLOOD FOOT  Final   Special Requests   Final    BOTTLES DRAWN AEROBIC AND ANAEROBIC Blood Culture results may not be optimal due to an excessive volume of blood received in culture bottles   Culture   Final    NO GROWTH 5 DAYS Performed at [facility], [address], [place], [place] [address]    Report Status 04/27/2020 FINAL  Final         Radiology Studies: No results found.      Scheduled Meds: . acidophilus  1 capsule Oral Daily  . alteplase  2 mg Intracatheter Once  . alteplase  2 mg Intracatheter Once  . amiodarone  200 mg Oral BID  . Chlorhexidine Gluconate Cloth  6 each Topical Daily  . docusate sodium  200 mg  Oral BID  . dolutegravir  50 mg Oral Daily  . famotidine  10 mg Oral Q1200  . feeding supplement (NEPRO CARB STEADY)  237 mL Oral BID BM  . guaiFENesin-dextromethorphan  10 mL Oral Q8H  . heparin injection (subcutaneous)  5,000 Units Subcutaneous Q8H  . mometasone-formoterol  2 puff Inhalation BID  . multivitamin  1 tablet Oral QHS  . pregabalin  25 mg Oral Daily  . rilpivirine  25 mg Oral Q breakfast  . sodium chloride flush  5 mL Intracatheter Q8H  . sodium zirconium cyclosilicate  10 g Oral Daily   Continuous Infusions: . sodium chloride 250 mL (04/28/20 0536)  . meropenem (MERREM) IV 500 mg (04/28/20 0538)     LOS: 14 days    Time spent: 30 mins    Wyvonnia Dusky, MD [facility] Pager [phone]  If 7PM-7AM, please contact night-coverage [email or web address] 04/28/2020, 11:48 AM '

## 2020-04-28 NOTE — Progress Notes (Signed)
[facility]  ROUNDING NOTE   Subjective:   Daughter at bedside.  Tmax 102  Objective:  Vital signs in last 24 hours:  Temp:  [98 F (36.7 C)-102 F (38.9 C)] 99.9 F (37.7 C) (08/30 1116) Pulse Rate:  [99-112] 110 (08/30 1116) Resp:  [16-20] 18 (08/30 1116) BP: (106-170)/(64-84) 135/78 (08/30 1116) SpO2:  [87 %-98 %] 87 % (08/30 1116)  Weight change:  Filed Weights   04/24/20 0455 04/25/20 0508 04/26/20 0458  Weight: 133.5 kg 133.1 kg 131.3 kg    Intake/Output: I/O last 3 completed shifts: In: 790.5 [P.O.:600; I.V.:80; Other:10.5; IV Piggyback:100] Out: 1850 [VPXTG:6269; Drains:75]   Intake/Output this shift:  No intake/output data recorded.  Physical Exam: General: NAD,   Head: Normocephalic, atraumatic. Moist oral mucosal membranes  Eyes: Anicteric, PERRL  Neck: Supple, trachea midline  Lungs:  Clear to auscultation  Heart: Regular rate and rhythm  Abdomen:  Soft, nontender,   Extremities:  + peripheral edema.  Neurologic: Nonfocal, moving all four extremities  Skin: No lesions  Access: RIJ permcath 8/27 Dr. [name]    Basic Metabolic Panel: Recent Labs  Lab 04/24/20 4854 04/24/20 6270 04/25/20 3500 04/25/20 9381 04/25/20 1750 04/26/20 0552 04/27/20 0432 04/28/20 0640  NA 135  --  139  --   --  136 133* 134*  K 5.2*  --  5.4*  --   --  4.6 4.5 4.5  CL 97*  --  99  --   --  100 99 98  CO2 22  --  23  --   --  25 21* 24  GLUCOSE 143*  --  123*  --   --  119* 94 107*  BUN 95*  --  108*  --   --  66* 71* 77*  CREATININE 4.13*  --  4.15*  --   --  2.85* 3.26* 3.86*  CALCIUM 7.9*   < > 8.0*   < >  --  7.4* 7.4* 7.6*  MG 2.2  --  2.3  --   --  2.1 2.1 1.7  PHOS 8.6*   < > 9.1*  --  8.9* 5.7* 6.3* 6.1*   < > = values in this interval not displayed.    Liver Function Tests: Recent Labs  Lab 04/22/20 1112 04/23/20 0545 04/24/20 0508 04/25/20 0613  AST 40 32 36 36  ALT 113* 83* 60* 44  ALKPHOS 107 106 101 100  BILITOT 0.7 1.2 1.0 1.2   PROT 5.3* 5.3* 5.0* 4.9*  ALBUMIN 2.1* 2.1* 2.0* 1.9*   No results for input(s): LIPASE, AMYLASE in the last 168 hours. No results for input(s): AMMONIA in the last 168 hours.  CBC: Recent Labs  Lab 04/22/20 1112 04/23/20 0545 04/24/20 0508 04/25/20 0613 04/26/20 0552 04/27/20 0432 04/28/20 0622  WBC 35.1*   < > 33.3* 34.1* 26.0* 25.3* 33.1*  NEUTROABS 32.0*  --   --   --   --   --   --   HGB 11.4*   < > 10.1* 10.5* 10.0* 8.9* 9.0*  HCT 32.9*   < > 29.8* 30.9* 30.3* 27.2* 27.2*  MCV 87.7   < > 91.1 89.6 93.2 94.4 92.5  PLT 130*   < > 179 206 181 172 193   < > = values in this interval not displayed.    Cardiac Enzymes: No results for input(s): CKTOTAL, CKMB, CKMBINDEX, TROPONINI in the last 168 hours.  BNP: Invalid input(s): POCBNP  CBG: No  results for input(s): GLUCAP in the last 168 hours.  Microbiology: Results for orders placed or performed during the hospital encounter of 04/14/20  SARS Coronavirus 2 by RT PCR (hospital order, performed in [facility] hospital lab) Nasopharyngeal Nasopharyngeal Swab     Status: None   Collection Time: 04/14/20  2:17 AM   Specimen: Nasopharyngeal Swab  Result Value Ref Range Status   SARS Coronavirus 2 NEGATIVE NEGATIVE Final    Comment: (NOTE) SARS-CoV-2 target nucleic acids are NOT DETECTED.  The SARS-CoV-2 RNA is generally detectable in upper and lower respiratory specimens during the acute phase of infection. The lowest concentration of SARS-CoV-2 viral copies this assay can detect is 250 copies / mL. A negative result does not preclude SARS-CoV-2 infection and should not be used as the sole basis for treatment or other patient management decisions.  A negative result may occur with improper specimen collection / handling, submission of specimen other than nasopharyngeal swab, presence of viral mutation(s) within the areas targeted by this assay, and inadequate number of viral copies (<250 copies / mL). A negative result  must be combined with clinical observations, patient history, and epidemiological information.  Fact Sheet for Patients:   StrictlyIdeas.no  Fact Sheet for Healthcare Providers: BankingDealers.co.za  This test is not yet approved or  cleared by the [place] FDA and has been authorized for detection and/or diagnosis of SARS-CoV-2 by FDA under an Emergency Use Authorization (EUA).  This EUA will remain in effect (meaning this test can be used) for the duration of the COVID-19 declaration under Section 564(b)(1) of the Act, 21 U.S.C. section 360bbb-3(b)(1), unless the authorization is terminated or revoked sooner.  Performed at [facility], [address] [place], [place] [address]   CULTURE, BLOOD (ROUTINE X 2) w Reflex to ID Panel     Status: None   Collection Time: 04/14/20  8:14 AM   Specimen: BLOOD  Result Value Ref Range Status   Specimen Description BLOOD LEFT [facility]  Final   Special Requests   Final    BOTTLES DRAWN AEROBIC AND ANAEROBIC Blood Culture adequate volume   Culture   Final    NO GROWTH 5 DAYS Performed at [facility], [address] [place], [place] [address]    Report Status 04/19/2020 FINAL  Final  CULTURE, BLOOD (ROUTINE X 2) w Reflex to ID Panel     Status: None   Collection Time: 04/14/20  8:14 AM   Specimen: BLOOD  Result Value Ref Range Status   Specimen Description BLOOD LEFT WRIST  Final   Special Requests   Final    BOTTLES DRAWN AEROBIC AND ANAEROBIC Blood Culture adequate volume   Culture   Final    NO GROWTH 5 DAYS Performed at [facility], [address], [place], [place] [address]    Report Status 04/19/2020 FINAL  Final  Aerobic/Anaerobic Culture (surgical/deep wound)     Status: None   Collection Time: 04/14/20 11:14 AM   Specimen: Abscess  Result Value Ref Range Status   Specimen Description   Final    ABSCESS Performed at [facility], [address], [place], [place] [address]    Special Requests   Final    NONE Performed at [facility], [address] [place],  [address]    Gram Stain   Final    NO WBC SEEN ABUNDANT GRAM NEGATIVE RODS FEW GRAM POSITIVE COCCI FEW GRAM POSITIVE RODS Performed at [facility], [place] [address] [place], [place] [address]    Culture   Final    ABUNDANT KLEBSIELLA PNEUMONIAE ABUNDANT PSEUDOMONAS AERUGINOSA MODERATE ESCHERICHIA COLI Confirmed Extended Spectrum Beta-Lactamase Producer (ESBL).  In bloodstream infections from ESBL organisms, carbapenems are preferred over piperacillin/tazobactam. They are shown to have a lower risk of mortality. MIXED ANAEROBIC FLORA PRESENT.  CALL LAB IF FURTHER IID REQUIRED.    Report Status 04/20/2020 FINAL  Final   Organism ID, Bacteria KLEBSIELLA PNEUMONIAE  Final   Organism ID, Bacteria PSEUDOMONAS AERUGINOSA  Final   Organism ID, Bacteria ESCHERICHIA COLI  Final      Susceptibility   Escherichia coli - MIC*    AMPICILLIN >=32 RESISTANT Resistant     CEFAZOLIN >=64 RESISTANT Resistant     CEFEPIME 16 RESISTANT Resistant     CEFTAZIDIME RESISTANT Resistant     CEFTRIAXONE >=64 RESISTANT Resistant     CIPROFLOXACIN >=4 RESISTANT Resistant     GENTAMICIN <=1 SENSITIVE Sensitive     IMIPENEM <=0.25 SENSITIVE Sensitive     TRIMETH/SULFA >=320 RESISTANT Resistant     AMPICILLIN/SULBACTAM >=32 RESISTANT Resistant     PIP/TAZO 8 SENSITIVE Sensitive     * MODERATE ESCHERICHIA COLI   Klebsiella pneumoniae - MIC*    AMPICILLIN >=32 RESISTANT Resistant     CEFAZOLIN <=4 SENSITIVE Sensitive     CEFEPIME <=0.12 SENSITIVE Sensitive     CEFTAZIDIME <=1 SENSITIVE Sensitive     CEFTRIAXONE <=0.25 SENSITIVE Sensitive     CIPROFLOXACIN <=0.25 SENSITIVE Sensitive     GENTAMICIN <=1 SENSITIVE Sensitive     IMIPENEM <=0.25 SENSITIVE Sensitive     TRIMETH/SULFA <=20 SENSITIVE Sensitive     AMPICILLIN/SULBACTAM 16 INTERMEDIATE Intermediate      PIP/TAZO 8 SENSITIVE Sensitive     * ABUNDANT KLEBSIELLA PNEUMONIAE   Pseudomonas aeruginosa - MIC*    CEFTAZIDIME 4 SENSITIVE Sensitive     CIPROFLOXACIN <=0.25 SENSITIVE Sensitive     GENTAMICIN 2 SENSITIVE Sensitive     IMIPENEM 2 SENSITIVE Sensitive     PIP/TAZO 8 SENSITIVE Sensitive     CEFEPIME 4 SENSITIVE Sensitive     * ABUNDANT PSEUDOMONAS AERUGINOSA  MRSA PCR Screening     Status: None   Collection Time: 04/14/20  6:19 PM   Specimen: Nasopharyngeal  Result Value Ref Range Status   MRSA by PCR NEGATIVE NEGATIVE Final    Comment:        The GeneXpert MRSA Assay (FDA approved for NASAL specimens only), is one component of a comprehensive MRSA colonization surveillance program. It is not intended to diagnose MRSA infection nor to guide or monitor treatment for MRSA infections. Performed at [facility], [address], [place], [place] [address]   Urine Culture     Status: None   Collection Time: 04/15/20  4:28 AM   Specimen: Urine, Random  Result Value Ref Range Status   Specimen Description   Final    URINE, RANDOM Performed at [facility], [address], [place], [place] [address]    Special Requests   Final    NONE Performed at [facility], [address], [place], [place] [address]    Culture   Final    NO GROWTH Performed at [facility], [place] [address], [place],  [address]    Report Status 04/16/2020 FINAL  Final  CULTURE, BLOOD (ROUTINE X 2) w Reflex to ID Panel     Status: None   Collection Time:  04/22/20  6:12 PM   Specimen: BLOOD  Result Value Ref Range Status   Specimen Description BLOOD FOOT  Final   Special Requests   Final    BOTTLES DRAWN AEROBIC AND ANAEROBIC Blood Culture adequate volume   Culture   Final    NO GROWTH 5 DAYS Performed at [facility], [address] [place], [place] [address]    Report Status 04/27/2020 FINAL  Final  CULTURE, BLOOD (ROUTINE X 2) w Reflex to ID  Panel     Status: None   Collection Time: 04/22/20  6:17 PM   Specimen: BLOOD  Result Value Ref Range Status   Specimen Description BLOOD FOOT  Final   Special Requests   Final    BOTTLES DRAWN AEROBIC AND ANAEROBIC Blood Culture results may not be optimal due to an excessive volume of blood received in culture bottles   Culture   Final    NO GROWTH 5 DAYS Performed at [facility], [address], [place], [place] [address]    Report Status 04/27/2020 FINAL  Final    Coagulation Studies: No results for input(s): LABPROT, INR in the last 72 hours.  Urinalysis: No results for input(s): COLORURINE, LABSPEC, PHURINE, GLUCOSEU, HGBUR, BILIRUBINUR, KETONESUR, PROTEINUR, UROBILINOGEN, NITRITE, LEUKOCYTESUR in the last 72 hours.  Invalid input(s): APPERANCEUR    Imaging: DG Chest Port 1 View  Result Date: 04/28/2020 CLINICAL DATA:  Sepsis. EXAM: PORTABLE CHEST 1 VIEW COMPARISON:  April 14, 2020. FINDINGS: The heart size and mediastinal contours are within normal limits. Both lungs are clear. No pneumothorax or pleural effusion is noted. Interval placement of right internal jugular catheter with distal tip in expected position of the SVC. The visualized skeletal structures are unremarkable. IMPRESSION: No active disease. Electronically Signed   By: Marijo Conception M.D.   On: 04/28/2020 13:08     Medications:   . sodium chloride 250 mL (04/28/20 0536)  . meropenem (MERREM) IV 500 mg (04/28/20 0538)   . acidophilus  1 capsule Oral Daily  . alteplase  2 mg Intracatheter Once  . alteplase  2 mg Intracatheter Once  . amiodarone  200 mg Oral BID  . Chlorhexidine Gluconate Cloth  6 each Topical Daily  . docusate sodium  200 mg Oral BID  . dolutegravir  50 mg Oral Daily  . famotidine  10 mg Oral Q1200  . feeding supplement (NEPRO CARB STEADY)  237 mL Oral BID BM  . guaiFENesin-dextromethorphan  10 mL Oral Q8H  . heparin injection (subcutaneous)  5,000 Units Subcutaneous Q8H   . mometasone-formoterol  2 puff Inhalation BID  . multivitamin  1 tablet Oral QHS  . pregabalin  25 mg Oral Daily  . rilpivirine  25 mg Oral Q breakfast  . sodium chloride flush  5 mL Intracatheter Q8H  . sodium zirconium cyclosilicate  10 g Oral Daily   acetaminophen **OR** acetaminophen, albuterol, ALPRAZolam, bisacodyl, heparin NICU/SCN flush, HYDROmorphone (DILAUDID) injection, ipratropium-albuterol, labetalol, magic mouthwash **AND** lidocaine, magnesium hydroxide, menthol-cetylpyridinium, oxyCODONE-acetaminophen, phenol, simethicone  Assessment/ Plan:  Melanie Bowen is a 59 y.o. black female with HIV, COPD, hypertension, GERD, who is admitted to [facility] on 04/14/2020 for Dyspnea [R06.00] Colonic diverticular abscess [K57.20] Diverticulitis of large intestine with abscess without bleeding [K57.20] Abscess of sigmoid colon due to diverticulitis [K57.20]  1. Acute renal failure with hyperkalemia: with no history of chronic kidney disease. History of bland urine.  Requiring hemodialysis. First treatment on 8/18.  IV contrast exposure on  8/16. Ultrasound reviewed, no evidence of HIV induced nephropathy No acute indication for dialysis today. Tentatively plan for dialysis tomorrow.  - holding home hydrochlorothiazide and losartan - No acute indication for dialysis today.  - lokelma  2. Hypertension: blood pressure at goal. Home regimen of amlodipine, hydrochlorothiazide, and losartan  Currently not on any blood pressure agents.   3. Anemia of kidney failure: hemoglobin of 9. Normocytic. No ESA exposure on this admission.   4. Hyponatremia: secondary to renal failure.    LOS: [place] 8/30/20212:47 PM

## 2020-04-28 NOTE — Care Management Important Message (Signed)
Important Message  Patient Details  Name: Melanie Bowen MRN: [id number] Date of Birth: Jul 10, 1961   Medicare Important Message Given:  Yes  Reviewed with patient via room phone due to isolation.     Dannette Barbara 04/28/2020, 2:06 PM

## 2020-04-28 NOTE — Progress Notes (Signed)
Occupational Therapy Treatment Patient Details Name: Melanie Bowen MRN: [id number] DOB: Jan 06, 1961 Today's Date: 04/28/2020    History of present illness Melanie Bowen is a 83yoF who comes to [facility] on 8/16 c LLQ ABD pain. Pt admitted c Acute sigmoid diverticulitis with diverticular abscess. PMH: COPD, asthma, HTN, diverticulosis, HIV, GERD, depression, bilat TKA, 3 level lumbar fusion, susequent lumbar hardware removal. Pt moved to ICU early in stay, inititated on HD on 04/16/20, temp cath removed 8/25. Pt  has had AF RVR issues. Pt also reports a subacute Rt rotator cuff injury with significant pain and disability, scheduled for repair with [name] in mid September.   OT comments  Upon entering the room, pt upset therapist waking her up and turning on lights for therapeutic intervention. Pt verbalizing, " Okay okay look I am doing these exercises" and demonstrated 2-3 ankle pumps and heel slides. Pt training off with conversation throughout as she falls asleep mid sentence but also reports increased pain in generalized area as she was unable to verbalize location. Pt declined OOB, EOB, and self care tasks this session. OT discussing importance of therapeutic intervention and participation and she reports, "Come see me in the morning" but pt reminded she refused physical therapy in morning hours. Pt continues to decline all level of care. Recommendation changed from CIR to SNF based on pt's lack of participation at this time.   Follow Up Recommendations  SNF    Equipment Recommendations  Other (comment) (defer to next venue of care)    Recommendations for Other Services Other (comment) (none at this time)    Precautions / Restrictions Precautions Precautions: Fall Precaution Comments: femoral cath removed       Mobility Bed Mobility     General bed mobility comments: Pt refuses OOB and EOB  Transfers    General transfer comment: Pt refuses to attempt this session         ADL either performed or assessed with clinical judgement   ADL Overall ADL's : Needs assistance/impaired     General ADL Comments: Pt refusing OOB and EOB this session as well as self care tasks. Pt attempting to grab items from tray and spilling them on her with L hand secondary to episodes of lethargy.     Vision Baseline Vision/History: Wears glasses Wears Glasses: Reading only Patient Visual Report: No change from baseline Vision Assessment?: No apparent visual deficits          Cognition Arousal/Alertness: Lethargic Behavior During Therapy: Restless Overall Cognitive Status: Within Functional Limits for tasks assessed      General Comments: Pt falling asleep mid sentence.                   Pertinent Vitals/ Pain       Pain Assessment: Faces Faces Pain Scale: No hurt         Frequency  Min 1X/week        Progress Toward Goals  OT Goals(current goals can now be found in the care plan section)  Progress towards OT goals: OT to reassess next treatment  Acute Rehab OT Goals Patient Stated Goal: go home OT Goal Formulation: With patient Time For Goal Achievement: 05/07/20 Potential to Achieve Goals: [place] Discharge plan needs to be updated       AM-PAC OT "6 Clicks" Daily Activity     Outcome Measure   Help from another person eating meals?: A Little Help from another person taking care of personal grooming?:  A Little Help from another person toileting, which includes using toliet, bedpan, or urinal?: Total Help from another person bathing (including washing, rinsing, drying)?: A Lot Help from another person to put on and taking off regular upper body clothing?: A Lot Help from another person to put on and taking off regular lower body clothing?: Total 6 Click Score: 12    End of Session    OT Visit Diagnosis: Muscle weakness (generalized) (M62.81)   Activity Tolerance Patient limited by lethargy   Patient Left in bed;with call  bell/phone within reach;with bed alarm set           Time: 1350-1403 OT Time Calculation (min): 13 min  Charges: OT General Charges $OT Visit: 1 Visit OT Treatments $Therapeutic Activity: 8-22 mins  Darleen Crocker, MS, OTR/L , CBIS ascom [phone]  04/28/20, 2:58 PM

## 2020-04-29 ENCOUNTER — Inpatient Hospital Stay: Payer: Medicare Other

## 2020-04-29 LAB — BASIC METABOLIC PANEL
Anion gap: 12 (ref 5–15)
BUN: 84 mg/dL — ABNORMAL HIGH (ref 6–20)
CO2: 24 mmol/L (ref 22–32)
Calcium: 7.6 mg/dL — ABNORMAL LOW (ref 8.9–10.3)
Chloride: 98 mmol/L (ref 98–111)
Creatinine, Ser: 4.62 mg/dL — ABNORMAL HIGH (ref 0.44–1.00)
GFR calc Af Amer: 11 mL/min — ABNORMAL LOW (ref >60)
GFR calc non Af Amer: 10 mL/min — ABNORMAL LOW (ref >60)
Glucose, Bld: 78 mg/dL (ref 70–99)
Potassium: 4.6 mmol/L (ref 3.5–5.1)
Sodium: 134 mmol/L — ABNORMAL LOW (ref 135–145)

## 2020-04-29 LAB — CBC
HCT: 24.4 % — ABNORMAL LOW (ref 36.0–46.0)
Hemoglobin: 8.3 g/dL — ABNORMAL LOW (ref 12.0–15.0)
MCH: 31 pg (ref 26.0–34.0)
MCHC: 34 g/dL (ref 30.0–36.0)
MCV: 91 fL (ref 80.0–100.0)
Platelets: 165 10*3/uL (ref 150–400)
RBC: 2.68 MIL/uL — ABNORMAL LOW (ref 3.87–5.11)
RDW: 18.2 % — ABNORMAL HIGH (ref 11.5–15.5)
WBC: 24.2 10*3/uL — ABNORMAL HIGH (ref 4.0–10.5)
nRBC: 0.1 % (ref 0.0–0.2)

## 2020-04-29 LAB — MAGNESIUM: Magnesium: 1.8 mg/dL (ref 1.7–2.4)

## 2020-04-29 LAB — PHOSPHORUS: Phosphorus: 6.9 mg/dL — ABNORMAL HIGH (ref 2.5–4.6)

## 2020-04-29 MED ORDER — DOLUTEGRAVIR SODIUM 50 MG PO TABS
50.0000 mg | ORAL_TABLET | Freq: Every day | ORAL | Status: DC
  Administered 2020-04-30 – 2020-05-02 (×3): 50 mg via ORAL
  Filled 2020-04-29 (×4): qty 1

## 2020-04-29 MED ORDER — RILPIVIRINE HCL 25 MG PO TABS
25.0000 mg | ORAL_TABLET | Freq: Every day | ORAL | Status: DC
  Administered 2020-04-30 – 2020-05-02 (×3): 25 mg via ORAL
  Filled 2020-04-29 (×5): qty 1

## 2020-04-29 MED ORDER — IOHEXOL 9 MG/ML PO SOLN
500.0000 mL | ORAL | Status: AC
  Administered 2020-04-29 (×2): 500 mL via ORAL

## 2020-04-29 NOTE — Progress Notes (Addendum)
Pt given Tylenol 650 mg PO as ordered. [name], NP notified of elevated temp of 101.3. Will reassess vitals.

## 2020-04-29 NOTE — Progress Notes (Signed)
Palliative: Palliative: Mrs. Boldon is lying quietly in bed.  She greets me making and somewhat keeping eye contact.  Appears acutely/chronically ill, morbidly obese.  I introduced myself as part of the palliative medicine team.  She tells me she does not remember our previous team visits stating that there have been a lot of people seeing her.  I attempt to talk about her acute health problems, but she redirects me to do tasks for her around the room.    I returned to talking about the treatment plan, and Ms. Glomski tells me that she will be going for an MRI today.  She quickly states that she does not understand why she has not been given water.  I attempt to talk with her about her acute health concerns, fluid restrictions that she returns to demanding water.  Water was provided, I shared that we will follow up another time.  Conference with attending related to patient condition, needs, goals of care.  Plan: At this point continue to treat the treatable, no intubation or BiPAP.  Time for outcomes.     25 minutes Palliative medicine team Team phone [phone] Greater than 50% of this time was spent counseling and coordinating care related to the above assessment and plan.

## 2020-04-29 NOTE — Progress Notes (Signed)
Temp is not 98.6. Pt is stable no complaints at this time.

## 2020-04-29 NOTE — Progress Notes (Signed)
[facility]  ROUNDING NOTE   Subjective:   Patient resting in bed,awake and alert, pleasant.Denies SOB, nausea,or vomiting.  Objective:  Vital signs in last 24 hours:  Temp:  [98.5 F (36.9 C)-101.3 F (38.5 C)] 99.5 F (37.5 C) (08/31 0915) Pulse Rate:  [109-127] 109 (08/31 0915) Resp:  [17-20] 20 (08/31 0915) BP: (87-136)/(55-103) 109/68 (08/31 0915) SpO2:  [85 %-99 %] 99 % (08/31 0600) Weight:  [134.4 kg] 134.4 kg (08/31 0500)  Weight change:  Filed Weights   04/25/20 0508 04/26/20 0458 04/29/20 0500  Weight: 133.1 kg 131.3 kg 134.4 kg    Intake/Output: I/O last 3 completed shifts: In: 460.5 [P.O.:360; Other:0.5; IV Piggyback:100] Out: 700 [Urine:700]   Intake/Output this shift:  Total I/O In: -  Out: 100 [Drains:100]  Physical Exam: General: Resting in bed, in no acute distress  Head: Oal mucosal membranes  Eyes: Anicteric  Neck: Supple, trachea at the midline  Lungs:  diminished at the bases  Heart: S1S2, Regular rate and rhythm  Abdomen:  Obese, nontender,   Extremities:  1+ peripheral edema.  Neurologic: Alert, oriented, answers simple questions appropriately  Skin: No acute rashes or  lesions  Access: RIJ permcath 8/27 Dr. [name]    Basic Metabolic Panel: Recent Labs  Lab 04/25/20 [id number] 04/25/20 0175 04/25/20 1750 04/26/20 1025 04/26/20 8527 04/27/20 0432 04/28/20 0640 04/29/20 0457  NA 139  --   --  136  --  133* 134* 134*  K 5.4*  --   --  4.6  --  4.5 4.5 4.6  CL 99  --   --  100  --  99 98 98  CO2 23  --   --  25  --  21* 24 24  GLUCOSE 123*  --   --  119*  --  94 107* 78  BUN 108*  --   --  66*  --  71* 77* 84*  CREATININE 4.15*  --   --  2.85*  --  3.26* 3.86* 4.62*  CALCIUM 8.0*   < >  --  7.4*   < > 7.4* 7.6* 7.6*  MG 2.3  --   --  2.1  --  2.1 1.7 1.8  PHOS 9.1*   < > 8.9* 5.7*  --  6.3* 6.1* 6.9*   < > = values in this interval not displayed.    Liver Function Tests: Recent Labs  Lab 04/23/20 0545 04/24/20 0508  04/25/20 0613  AST 32 36 36  ALT 83* 60* 44  ALKPHOS 106 101 100  BILITOT 1.2 1.0 1.2  PROT 5.3* 5.0* 4.9*  ALBUMIN 2.1* 2.0* 1.9*   No results for input(s): LIPASE, AMYLASE in the last 168 hours. No results for input(s): AMMONIA in the last 168 hours.  CBC: Recent Labs  Lab 04/25/20 0613 04/26/20 0552 04/27/20 0432 04/28/20 0622 04/29/20 0457  WBC 34.1* 26.0* 25.3* 33.1* 24.2*  HGB 10.5* 10.0* 8.9* 9.0* 8.3*  HCT 30.9* 30.3* 27.2* 27.2* 24.4*  MCV 89.6 93.2 94.4 92.5 91.0  PLT 206 181 172 193 165    Cardiac Enzymes: No results for input(s): CKTOTAL, CKMB, CKMBINDEX, TROPONINI in the last 168 hours.  BNP: Invalid input(s): POCBNP  CBG: No results for input(s): GLUCAP in the last 168 hours.  Microbiology: Results for orders placed or performed during the hospital encounter of 04/14/20  SARS Coronavirus 2 by RT PCR (hospital order, performed in [facility] hospital lab) Nasopharyngeal Nasopharyngeal Swab  Status: None   Collection Time: 04/14/20  2:17 AM   Specimen: Nasopharyngeal Swab  Result Value Ref Range Status   SARS Coronavirus 2 NEGATIVE NEGATIVE Final    Comment: (NOTE) SARS-CoV-2 target nucleic acids are NOT DETECTED.  The SARS-CoV-2 RNA is generally detectable in upper and lower respiratory specimens during the acute phase of infection. The lowest concentration of SARS-CoV-2 viral copies this assay can detect is 250 copies / mL. A negative result does not preclude SARS-CoV-2 infection and should not be used as the sole basis for treatment or other patient management decisions.  A negative result may occur with improper specimen collection / handling, submission of specimen other than nasopharyngeal swab, presence of viral mutation(s) within the areas targeted by this assay, and inadequate number of viral copies (<250 copies / mL). A negative result must be combined with clinical observations, patient history, and epidemiological information.  Fact  Sheet for Patients:   StrictlyIdeas.no  Fact Sheet for Healthcare Providers: BankingDealers.co.za  This test is not yet approved or  cleared by the [place] FDA and has been authorized for detection and/or diagnosis of SARS-CoV-2 by FDA under an Emergency Use Authorization (EUA).  This EUA will remain in effect (meaning this test can be used) for the duration of the COVID-19 declaration under Section 564(b)(1) of the Act, 21 U.S.C. section 360bbb-3(b)(1), unless the authorization is terminated or revoked sooner.  Performed at [facility], [address] [place], [place] [address]   CULTURE, BLOOD (ROUTINE X 2) w Reflex to ID Panel     Status: None   Collection Time: 04/14/20  8:14 AM   Specimen: BLOOD  Result Value Ref Range Status   Specimen Description BLOOD LEFT [facility]  Final   Special Requests   Final    BOTTLES DRAWN AEROBIC AND ANAEROBIC Blood Culture adequate volume   Culture   Final    NO GROWTH 5 DAYS Performed at [facility], [address] [place], [place] [address]    Report Status 04/19/2020 FINAL  Final  CULTURE, BLOOD (ROUTINE X 2) w Reflex to ID Panel     Status: None   Collection Time: 04/14/20  8:14 AM   Specimen: BLOOD  Result Value Ref Range Status   Specimen Description BLOOD LEFT WRIST  Final   Special Requests   Final    BOTTLES DRAWN AEROBIC AND ANAEROBIC Blood Culture adequate volume   Culture   Final    NO GROWTH 5 DAYS Performed at [facility], [address] [place], [place] [address]    Report Status 04/19/2020 FINAL  Final  Aerobic/Anaerobic Culture (surgical/deep wound)     Status: None   Collection Time: 04/14/20 11:14 AM   Specimen: Abscess  Result Value Ref Range Status   Specimen Description   Final    ABSCESS Performed at [facility], [address], [place], [place] [address]    Special Requests   Final    NONE Performed at [facility], [place], [place] [address]    Gram Stain   Final    NO WBC SEEN ABUNDANT GRAM NEGATIVE RODS FEW GRAM POSITIVE COCCI FEW GRAM POSITIVE RODS Performed at [facility], [place] [address] [place], [place] [address]    Culture   Final    ABUNDANT KLEBSIELLA PNEUMONIAE ABUNDANT PSEUDOMONAS AERUGINOSA MODERATE ESCHERICHIA COLI Confirmed Extended Spectrum Beta-Lactamase Producer (ESBL).  In bloodstream infections from ESBL organisms, carbapenems are preferred over  piperacillin/tazobactam. They are shown to have a lower risk of mortality. MIXED ANAEROBIC FLORA PRESENT.  CALL LAB IF FURTHER IID REQUIRED.    Report Status 04/20/2020 FINAL  Final   Organism ID, Bacteria KLEBSIELLA PNEUMONIAE  Final   Organism ID, Bacteria PSEUDOMONAS AERUGINOSA  Final   Organism ID, Bacteria ESCHERICHIA COLI  Final      Susceptibility   Escherichia coli - MIC*    AMPICILLIN >=32 RESISTANT Resistant     CEFAZOLIN >=64 RESISTANT Resistant     CEFEPIME 16 RESISTANT Resistant     CEFTAZIDIME RESISTANT Resistant     CEFTRIAXONE >=64 RESISTANT Resistant     CIPROFLOXACIN >=4 RESISTANT Resistant     GENTAMICIN <=1 SENSITIVE Sensitive     IMIPENEM <=0.25 SENSITIVE Sensitive     TRIMETH/SULFA >=320 RESISTANT Resistant     AMPICILLIN/SULBACTAM >=32 RESISTANT Resistant     PIP/TAZO 8 SENSITIVE Sensitive     * MODERATE ESCHERICHIA COLI   Klebsiella pneumoniae - MIC*    AMPICILLIN >=32 RESISTANT Resistant     CEFAZOLIN <=4 SENSITIVE Sensitive     CEFEPIME <=0.12 SENSITIVE Sensitive     CEFTAZIDIME <=1 SENSITIVE Sensitive     CEFTRIAXONE <=0.25 SENSITIVE Sensitive     CIPROFLOXACIN <=0.25 SENSITIVE Sensitive     GENTAMICIN <=1 SENSITIVE Sensitive     IMIPENEM <=0.25 SENSITIVE Sensitive     TRIMETH/SULFA <=20 SENSITIVE Sensitive     AMPICILLIN/SULBACTAM 16 INTERMEDIATE Intermediate     PIP/TAZO 8 SENSITIVE Sensitive     * ABUNDANT KLEBSIELLA PNEUMONIAE   Pseudomonas aeruginosa -  MIC*    CEFTAZIDIME 4 SENSITIVE Sensitive     CIPROFLOXACIN <=0.25 SENSITIVE Sensitive     GENTAMICIN 2 SENSITIVE Sensitive     IMIPENEM 2 SENSITIVE Sensitive     PIP/TAZO 8 SENSITIVE Sensitive     CEFEPIME 4 SENSITIVE Sensitive     * ABUNDANT PSEUDOMONAS AERUGINOSA  MRSA PCR Screening     Status: None   Collection Time: 04/14/20  6:19 PM   Specimen: Nasopharyngeal  Result Value Ref Range Status   MRSA by PCR NEGATIVE NEGATIVE Final    Comment:        The GeneXpert MRSA Assay (FDA approved for NASAL specimens only), is one component of a comprehensive MRSA colonization surveillance program. It is not intended to diagnose MRSA infection nor to guide or monitor treatment for MRSA infections. Performed at [facility], [address] [place], [place] [address]   Urine Culture     Status: None   Collection Time: 04/15/20  4:28 AM   Specimen: Urine, Random  Result Value Ref Range Status   Specimen Description   Final    URINE, RANDOM Performed at [facility], [address] [place], [place] [address]    Special Requests   Final    NONE Performed at [facility], [address] [place], [place] [address]    Culture   Final    NO GROWTH Performed at [facility], [place] [address], [place], [place] [address]    Report Status 04/16/2020 FINAL  Final  CULTURE, BLOOD (ROUTINE X 2) w Reflex to ID Panel     Status: None   Collection Time: 04/22/20  6:12 PM   Specimen: BLOOD  Result Value Ref Range Status   Specimen Description BLOOD FOOT  Final   Special Requests   Final    BOTTLES DRAWN AEROBIC AND ANAEROBIC Blood Culture adequate volume   Culture  Final    NO GROWTH 5 DAYS Performed at [facility], [address] [place], [place] [address]    Report Status 04/27/2020 FINAL  Final  CULTURE, BLOOD (ROUTINE X 2) w Reflex to ID Panel     Status: None   Collection Time: 04/22/20  6:17 PM   Specimen: BLOOD  Result Value Ref  Range Status   Specimen Description BLOOD FOOT  Final   Special Requests   Final    BOTTLES DRAWN AEROBIC AND ANAEROBIC Blood Culture results may not be optimal due to an excessive volume of blood received in culture bottles   Culture   Final    NO GROWTH 5 DAYS Performed at [facility], [address] [place], [place] [address]    Report Status 04/27/2020 FINAL  Final    Coagulation Studies: No results for input(s): LABPROT, INR in the last 72 hours.  Urinalysis: No results for input(s): COLORURINE, LABSPEC, PHURINE, GLUCOSEU, HGBUR, BILIRUBINUR, KETONESUR, PROTEINUR, UROBILINOGEN, NITRITE, LEUKOCYTESUR in the last 72 hours.  Invalid input(s): APPERANCEUR    Imaging: DG Chest Port 1 View  Result Date: 04/28/2020 CLINICAL DATA:  Sepsis. EXAM: PORTABLE CHEST 1 VIEW COMPARISON:  April 14, 2020. FINDINGS: The heart size and mediastinal contours are within normal limits. Both lungs are clear. No pneumothorax or pleural effusion is noted. Interval placement of right internal jugular catheter with distal tip in expected position of the SVC. The visualized skeletal structures are unremarkable. IMPRESSION: No active disease. Electronically Signed   By: Marijo Conception M.D.   On: 04/28/2020 13:08     Medications:   . sodium chloride 250 mL (04/28/20 0536)  . meropenem (MERREM) IV 500 mg (04/29/20 0920)   . acidophilus  1 capsule Oral Daily  . alteplase  2 mg Intracatheter Once  . alteplase  2 mg Intracatheter Once  . amiodarone  200 mg Oral BID  . Chlorhexidine Gluconate Cloth  6 each Topical Daily  . docusate sodium  200 mg Oral BID  . [START ON 04/30/2020] rilpivirine  25 mg Oral Q breakfast   And  . [START ON 04/30/2020] dolutegravir  50 mg Oral Q breakfast  . famotidine  10 mg Oral Q1200  . feeding supplement (NEPRO CARB STEADY)  237 mL Oral BID BM  . guaiFENesin-dextromethorphan  10 mL Oral Q8H  . heparin injection (subcutaneous)  5,000 Units Subcutaneous Q8H  .  mometasone-formoterol  2 puff Inhalation BID  . multivitamin  1 tablet Oral QHS  . pregabalin  25 mg Oral Daily  . sodium chloride flush  5 mL Intracatheter Q8H  . sodium zirconium cyclosilicate  10 g Oral Daily   acetaminophen **OR** acetaminophen, albuterol, ALPRAZolam, bisacodyl, heparin NICU/SCN flush, HYDROmorphone (DILAUDID) injection, iohexol, ipratropium-albuterol, labetalol, magic mouthwash **AND** lidocaine, magnesium hydroxide, menthol-cetylpyridinium, oxyCODONE-acetaminophen, phenol, simethicone  Assessment/ Plan:  [name] is a 59 y.o. black female with HIV, COPD, hypertension, GERD, who is admitted to [facility] on 04/14/2020 for Dyspnea [R06.00] Colonic diverticular abscess [K57.20] Diverticulitis of large intestine with abscess without bleeding [K57.20] Abscess of sigmoid colon due to diverticulitis [K57.20]  1. Acute renal failure with hyperkalemia: with no history of chronic kidney disease. History of bland urine.  Requiring hemodialysis. First treatment on 8/18.  IV contrast exposure on 8/16. Ultrasound reviewed, no evidence of HIV induced nephropathy Cr 4.6 today Will hold dialysis today, will continue monitoring closely.  2. Hypertension: blood pressure within normal range with current medication regimen.  3. Anemia  of kidney failure: hemoglobin of 8.3 today. Normocytic  4. Hyponatremia: secondary to renal failure.  Na+134. Will continue monitoring.   Patient was seen and examined with Crosby Oyster, DNP. No evidence of underlying HIV-induced nephropathy. Above plan was discussed and agreed upon on the signing of this note.   Melanie Bowen , [facility]  8/31/20213:57 PM     LOS: [address] Allix Blomquist 8/31/20212:42 PM

## 2020-04-29 NOTE — Progress Notes (Signed)
[facility] SURGICAL PROGRESS NOTE (cpt [phone])  Hospital Day(s): 15.   Post op day(s): 4 Days Post-Op.   Interval History:  Patient seen and examined She continues to have intermittent fevers overnight, T-max 101.3 at 0400 Patient reports she is feeling okay this morning and is ready to get moving She denies abdominal pain, nausea, emesis. Leukocytosis continues to fluctuate, markedly improved this morning to 24.2K (from 33.1K) BMP and electrolytes remain consistent with degree of renal failure, did not dialyze yesterday, UO not recorded Drain output not recorded She has remained on regular diet, tolerating well  CT Abdomen/Pelvis ordered yesterday in setting of fever, but this has not been completed.   Review of Systems:  Constitutional: denies fever, chills  HEENT: denies cough or congestion  Respiratory: + shortness of breath  Cardiovascular: denies chest pain or palpitations  Gastrointestinal: denies abdominal pain, N/V, or diarrhea/and bowel function as per interval history Genitourinary: denies burning with urination or urinary frequency Musculoskeletal: denies pain, decreased motor or sensation Integumentary: + Stage 1 pressure injury, right buttock    Vital signs in last 24 hours: [min-max] current  Temp:  [98.5 F (36.9 C)-101.3 F (38.5 C)] 98.6 F (37 C) (08/31 0600) Pulse Rate:  [110-127] 113 (08/31 0600) Resp:  [17-20] 20 (08/30 2222) BP: (87-170)/(55-103) 108/58 (08/31 0600) SpO2:  [85 %-99 %] 99 % (08/31 0600) Weight:  [134.4 kg] 134.4 kg (08/31 0500)     Height: 5\' 6"  (167.6 cm) Weight: 134.4 kg (Taken By [employer] ) BMI (Calculated): 47.86   Intake/Output last 2 shifts:  08/30 0701 - 08/31 0700 In: -  Out: 700 [Urine:700]   Physical Exam:  Constitutional: alert, cooperative and no distress  HENT: normocephalic without obvious abnormality  Eyes: PERRL, EOM's grossly intact and symmetric  Respiratory: Breathing appears slightly more  labored, back on [place] Cardiovascular: Tachycardic and sinus rhythm  Gastrointestinal: Soft, non-tender, and non-distended, no rebound/guarding, drain in LLQ appears seropurulent today  Musculoskeletal: 2+ pitting edema bilateral LE Integumentary: She does have a stage I pressure injury to the right buttock, no erythema or drainage   Labs:  CBC Latest Ref Rng & Units 04/29/2020 04/28/2020 04/27/2020  WBC 4.0 - 10.5 K/uL 24.2(H) 33.1(H) 25.3(H)  Hemoglobin 12.0 - 15.0 g/dL 8.3(L) 9.0(L) 8.9(L)  Hematocrit 36 - 46 % 24.4(L) 27.2(L) 27.2(L)  Platelets 150 - 400 K/uL 165 193 172   CMP Latest Ref Rng & Units 04/29/2020 04/28/2020 04/27/2020  Glucose 70 - 99 mg/dL 78 107(H) 94  BUN 6 - 20 mg/dL 84(H) 77(H) 71(H)  Creatinine 0.44 - 1.00 mg/dL 4.62(H) 3.86(H) 3.26(H)  Sodium 135 - 145 mmol/L 134(L) 134(L) 133(L)  Potassium 3.5 - 5.1 mmol/L 4.6 4.5 4.5  Chloride 98 - 111 mmol/L 98 98 99  CO2 22 - 32 mmol/L 24 24 21(L)  Calcium 8.9 - 10.3 mg/dL 7.6(L) 7.6(L) 7.4(L)  Total Protein 6.5 - 8.1 g/dL - - -  Total Bilirubin 0.3 - 1.2 mg/dL - - -  Alkaline Phos 38 - 126 U/L - - -  AST 15 - 41 U/L - - -  ALT 0 - 44 U/L - - -     Imaging studies: No new pertinent imaging studies, CT Abdomen/Pelvis pending this morning    Assessment/Plan: (ICD-10's: K32.20) 59 y.o. female still with intermittent fevers but improvement in leukocytosis this morning, admitted withseptic shock secondary tosigmoid diverticulitis with abscess s/p percutaneous drainage on 08/16andshe continues to bewithout any evidence of peritonitis/pneumoperitoneum/pneumatosis on examination or imaging,complicated  by pertinent comorbidities includingmarked COPD   - Agree with plan for repeating CT Abdomen/Pelvis in setting of fevers, unsure of source, abdominal examination is clinically reassuring.     - Continue IV Abx(meropenem); ID followingand appreciate assistance; Cx withpseudomonas, kleb [email or web address]             - Monitor  abdominal examination; leukocytosis - No emergent surgical intervention - Pain control prn; antiemetics prn - Maintain JP drain; record output  - She has developed stage 1 pressure injury to right buttock, recommend WOC RN consultation, pressure offloading, frequent repositioning  - Appreciate nephrology/cardiology assistance and recommendations - Continue to work with therapies; suspect she will need significant rehab - Further management per primary service; we will continue to follow   All of the above findings and recommendations were discussed with the patient, and the medical team, and all of patient's questions were answered to her expressed satisfaction.  -- Edison Simon, PA-C [facility] 04/29/2020, 7:37 AM [phone] M-F: 7am - 4pm

## 2020-04-29 NOTE — Plan of Care (Signed)
  Problem: Health Behavior/Discharge Planning: Goal: Ability to manage health-related needs will improve Outcome: Progressing   Problem: Education: Goal: Knowledge of General Education information will improve Description: Including pain rating scale, medication(s)/side effects and non-pharmacologic comfort measures Outcome: Progressing   

## 2020-04-29 NOTE — Progress Notes (Addendum)
Pt is planned for a CT w/ oral contract. Creatinine is trending up currently 4.6 Dialysis was cancled yesterday, pt also requiring frequent in & out caths. ID DR notified.   ID DR verified ok for pt to have PO contrast.

## 2020-04-29 NOTE — Progress Notes (Signed)
Physical Therapy Treatment Patient Details Name: Melanie Bowen MRN: [id number] DOB: 08-26-1961 Today's Date: 04/29/2020    History of Present Illness Melanie Bowen is a 60yoF who comes [facility] on 8/16 c LLQ ABD pain. Pt admitted c Acute sigmoid diverticulitis with diverticular abscess. PMH: COPD, asthma, HTN, diverticulosis, HIV, GERD, depression, bilat TKA, 3 level lumbar fusion, susequent lumbar hardware removal. Pt moved to ICU early in stay, inititated on HD on 04/16/20, temp cath removed 8/25. Pt  has had AF RVR issues. Pt also reports a subacute Rt rotator cuff injury with significant pain and disability, scheduled for repair with Melanie Bowen in mid September.    PT Comments    Pt was supine in bed with HOB slightly elevated upon arriving. She agrees to PT session and is cooperative and pleasant throughout. Somewhat frustrated about fluid restrictions however water sitting at bed side and pt drinking. She required max assist to exit L side of bed with increased time and constant vcs for technique. Sat EOB x 20 minutes throughout session. She performed several seated and supine exercises and overall tolerated session well. Was unwilling to trial standing even with encouragement to do so. " I'll try on Thursday." Overall pt pleased she was able to sit up and tolerate sitting. Max assist to return to supine and repositioned to [facility] with bed in trendelenburg to assist. Acute PT will continue to follow and advance as able per POC. Pt was in semi-fowlers position with call bell in reach, bed alarm in place, and pt resting comfortably.        Follow Up Recommendations  SNF;Supervision for mobility/OOB     Equipment Recommendations  Other (comment) (will continue to assess equipment needs going forward)    Recommendations for Other Services       Precautions / Restrictions Precautions Precautions: Fall Precaution Comments: femoral cath removed Restrictions Weight Bearing Restrictions: No     Mobility  Bed Mobility Overal bed mobility: Needs Assistance Bed Mobility: Supine to Sit;Sit to Supine     Supine to sit: Max assist Sit to supine: Max assist   General bed mobility comments: Max assist to progress from  supine (HOB elevated ~ 10 degrees) to short sit EOB. max assist after EOB activity to return to supine and reposition to [facility].  Transfers                 General transfer comment: pt unwilling  Ambulation/Gait                 Stairs             Wheelchair Mobility    Modified Rankin (Stroke Patients Only)       Balance Overall balance assessment: Needs assistance Sitting-balance support: Feet supported;Bilateral upper extremity supported Sitting balance-Leahy Scale: Fair Sitting balance - Comments: Pt sat EOB x ~ 20 minutes without LOB. slight unsteadiness while performing exercises. static sitting balance fair but poor dynmaic sitting balance                                    Cognition Arousal/Alertness: Awake/alert Behavior During Therapy: WFL for tasks assessed/performed Overall Cognitive Status: Within Functional Limits for tasks assessed                                 General Comments: Pt is Alert  throughout and cooperative. Generalized weakness and fatigue limiting. pt unwilling to try to stand even though demonstrates strength in sitting acceptable for trial.      Exercises General Exercises - Lower Extremity Ankle Circles/Pumps: AROM;Both;15 reps;Supine Quad Sets: AROM;Both;10 reps Long Arc Quad: Seated;Both;10 reps (3 sets) Hip Flexion/Marching: AROM;10 reps;Both;Seated (seated marching 3 sets 10 BLEs)    General Comments        Pertinent Vitals/Pain Pain Assessment: No/denies pain Pain Score: 0-No pain Faces Pain Scale: No hurt Pain Intervention(s): Limited activity within patient's tolerance;Monitored during session;Repositioned    Home Living                       Prior Function            PT Goals (current goals can now be found in the care plan section) Acute Rehab PT Goals Patient Stated Goal: go home Progress towards PT goals: Progressing toward goals    Frequency    Min 2X/week      PT Plan Current plan remains appropriate    Co-evaluation              AM-PAC PT "6 Clicks" Mobility   Outcome Measure  Help needed turning from your back to your side while in a flat bed without using bedrails?: A Lot Help needed moving from lying on your back to sitting on the side of a flat bed without using bedrails?: A Lot Help needed moving to and from a bed to a chair (including a wheelchair)?: Total Help needed standing up from a chair using your arms (e.g., wheelchair or bedside chair)?: Total Help needed to walk in hospital room?: Total Help needed climbing 3-5 steps with a railing? : Total 6 Click Score: 8    End of Session Equipment Utilized During Treatment: Oxygen Activity Tolerance: Patient tolerated treatment well;Patient limited by fatigue Patient left: in bed;with call bell/phone within reach;with bed alarm set Nurse Communication: Mobility status PT Visit Diagnosis: Unsteadiness on feet (R26.81);Other abnormalities of gait and mobility (R26.89);Muscle weakness (generalized) (M62.81);Difficulty in walking, not elsewhere classified (R26.2)     Time: 3235-5732 PT Time Calculation (min) (ACUTE ONLY): 32 min  Charges:  $Therapeutic Exercise: 8-22 mins $Therapeutic Activity: 8-22 mins                     Melanie Bowen PTA 04/29/20, 1:29 PM

## 2020-04-29 NOTE — Progress Notes (Signed)
Pt completed PO contract. CT notified.

## 2020-04-29 NOTE — Progress Notes (Signed)
PROGRESS NOTE   As per Dr. Roger Shelter: hypertension, diverticulosis, HIV, GERD and depression, presented with acute onset ofworsening left lower quadrant abdominal pain with N/V  And constipation. The patient started having pain Friday 9 days ago when she was seen in the ER and given p.o. Augmentin .   Upon presentation BP: 151/98 with otherwise normal vital signs. Labs revealed albumin of 2.8 with troponin of 6.4 and otherwise unremarkable CMP. Urinalysis was unremarkable. Abdominal and pelvic CT scan revealedthe following: 1. Findings consistent with acute sigmoid colon diverticulitis. 7.5 x 4.4 cm gas and fluid collection within the anterior pelvis suspicious for contained perforation/abscess, this appears to communicate with the thickened inflamed sigmoid colon. 2. Small amount of upper abdominal and pelvic ascites. 3. Thickened appearance of pelvic small bowel loops, likely reactive  The patient was given 1 L bolus of IV lactated Ringer, 1 p.o. Norco, half milligram of IV Dilaudid, 4 mg of IV morphine sulfate and 4 mg of IV Zofran as well as 3.375 g of IV Zosyn. Dr. Christian Mate was consulted about the patient and he will notify IR in a.m.    04/14/2020 patient hemodynamically decline was transferred to critical care team Surgery interventional radiology critical care team was consulted  04/15/2020 -patient remained under PCCM care lactic acid continues to worsen, now 7.2 (previously 6.1). CT Abdomen/Pelvis obtained at 22:00 earlier in the shift, which was NEGATIVE   04/16/2020 -patient remains critically ill, improving hemodynamically, status post septic shock, multiorgan failure including renal failure -nephrology was consulted patient will remain in ICU  04/17/2020-patient was seen, still critically ill, in respiratory distress audibly wheezing, with dialysis was initiated yesterday,-resuming again this morning ID following IV antibiotics surgery following still  recommending no surgery -gallbladder drain in place  8/20 / 2021 -patient was seen, more awake, status post hemodialysis once again yesterday, still tachycardic, tachypneic, satting 97% on 3 L of oxygen   04/19/2020 -patient seems better she is responding to hemodialysis, still in A. fib with RVR cardiology attending to control rate with amiodarone, blood pressure remained soft -remains lethargic family present at bedside  04/21/2020 -patient was seen and examined, much more awake, cooperative, still on amiodarone drip, heart rate varies, urine culture positive for ESBL-continues on IV meropenem, on room air today   04/22/2020 -patient had to be placed back on 3 L of oxygen due to shortness of breath, hemodialysis on hold, IV amiodarone was switched to p.o. yesterday, back to A. fib with RVR this a.m. Mentation much improved back to baseline --------------------------------------------------------------------------------------------------------------------------------------------   Hospital course from Dr. Lenna Sciara. Jimmye Norman 8/25-8/31/21: Pt's femoral cath for HD was pulled out as concern for possible infection. Another IJ HD was placed as per vascular surg. Pt has continued to spike fevers & labile WBCs of unknown etiology. Repeat CXR shows no active disease. Repeat CT abd/pelvis shows Partially visualized right lung base atelectasis or pneumonia. Retained contrast within the renal parenchyma concerning for acute renal insufficiency. There is heterogeneous and striated nephrogram which may represent pyelonephritis. Correlation with urinalysis recommended. Mild ileus.  No definite obstruction. Colonic diverticulosis. Overall decrease in the inflammatory changes and stranding compared to the prior CT. A pigtail drainage catheter within the pelvis is similar. ID is following.    Melanie Bowen  LNL:[id number] DOB: February 20, 1961 DOA: 04/14/2020 PCP: Theotis Burrow, MD   Assessment & Plan:    Principal Problem:   Severe sepsis with septic shock [facility]) Active Problems:   HTN (hypertension)   HIV (human immunodeficiency virus  infection) ([place])   Diverticulitis of colon   Abscess of sigmoid colon due to diverticulitis   Acute renal failure (ARF) ([place])   Hypotension   Diverticulosis   Acute respiratory distress   DNR (do not resuscitate) discussion   Palliative care by specialist   Dyspnea   Septic shock: w/ end organ failure, resolved. Secondary to diverticulosis/abscess. Continue on IV merrem. Initially patient was on BiPAP in ICU which she continued to refuse & then placed on high flow and now currently on 1L [place]. S/p IR placement of gallbladder drain. No surgical intervention currently as per gen surg.   Acute renal failure: secondary to septic shock. Continue on HD as per nephro. Nephro recs apprec. Continue percocet secondary to pain from recent IJ cath placement.   Gallbladder abscess: fluid aerobic/anaerobic cultures shows klebsiella pneumonia, pseudomonas aeruginosa, e. coli. Continue on IV merrem for a minimum of 2 weeks as per ID   Acute sigmoid diverticulitis and diverticular abscess: s/p CT-guided drainage by IR on 8/16. Continue on IV abxs as per ID. Abscess is not seen on repeat CT   Leukocytosis: trending down today, labile. Repeat CXR shows no active disease. CT abd/pelvis neg for abscess. Tylenol prn for fevers. Febrile at 101.3. Continue on IV merrem as per ID. Etiology unclear, possibly secondary to steroids but steroids were d/c 04/25/20  Hyperkalemia: within normal limits Will continue to monitir   Hypotension: resolved   HIV: well controlled CD4>1000 as per ID. Continue on antivirals. Management per ID   Lactic acidosis: resolved   Acute hypoxic respiratory failure: secondary to COPD exacerbation. D/c steroids. Continue on bronchodilators. Encourage incentive spirometry. Resolved  A. Fib: w/ RVR. Continue on amiodarone. Unable to tolerate cardizem  due to hypotension. Withholding anticoagulation at this time due to severe infection. Cardio recs apprec   Acute liver failure: resolved    Morbid obesity: BMI 46.6. Complicates overall care and prognosis    Metabolic encephalopathy: resolved    DVT prophylaxis: heparin Code Status: No intubation Family Communication:  Disposition Plan: likely d/c to CIR vs SNF  Status is: Inpatient  Remains inpatient appropriate because:IV treatments appropriate due to intensity of illness or inability to take PO. Will need outpatient HD spot prior to d/c. Spiking fevers still w/ labile WBCs   Dispo: The patient is from: Home              Anticipated d/c is to: SNF              Anticipated d/c date is: 3 days              Patient currently is not medically stable to d/c.    Consultants:   General Surgery  ID  Vascular surgery    Procedures:    Antimicrobials: merrem    Subjective: Pt c/o b/l LE swelling   Objective: Vitals:   04/28/20 2222 04/29/20 0416 04/29/20 0500 04/29/20 0600  BP: 136/74 (!) 115/55  (!) 108/58  Pulse: (!) 110 (!) 114  (!) 113  Resp: 20     Temp: 98.5 F (36.9 C) (!) 101.3 F (38.5 C)  98.6 F (37 C)  TempSrc: Oral     SpO2: 94% 99%  99%  Weight:   134.4 kg   Height:        Intake/Output Summary (Last 24 hours) at 04/29/2020 0740 Last data filed at 04/29/2020 0125 Gross per 24 hour  Intake --  Output 700 ml  Net -700 ml  Filed Weights   04/25/20 0508 04/26/20 0458 04/29/20 0500  Weight: 133.1 kg 131.3 kg 134.4 kg    Examination: General exam:Appears calm  Respiratory system: decreased breath sounds b/l Cardiovascular system:S1 &S2+. No rubs, gallops or clicks.  Gastrointestinal system:abdomen is obese, soft and nontender.Normalbowel sounds heard. Central nervous system:Alert and oriented. Moves all 4 extremities Psychiatry:Judgement and insight appear normal. Flat mood & affect      Data Reviewed: I have personally  reviewed following labs and imaging studies  CBC: Recent Labs  Lab 04/22/20 1112 04/23/20 0545 04/25/20 0613 04/26/20 0552 04/27/20 0432 04/28/20 0622 04/29/20 0457  WBC 35.1*   < > 34.1* 26.0* 25.3* 33.1* 24.2*  NEUTROABS 32.0*  --   --   --   --   --   --   HGB 11.4*   < > 10.5* 10.0* 8.9* 9.0* 8.3*  HCT 32.9*   < > 30.9* 30.3* 27.2* 27.2* 24.4*  MCV 87.7   < > 89.6 93.2 94.4 92.5 91.0  PLT 130*   < > 206 181 172 193 165   < > = values in this interval not displayed.   Basic Metabolic Panel: Recent Labs  Lab 04/25/20 0613 04/25/20 8850 04/25/20 1750 04/26/20 0552 04/27/20 0432 04/28/20 0640 04/29/20 0457  NA 139  --   --  136 133* 134* 134*  K 5.4*  --   --  4.6 4.5 4.5 4.6  CL 99  --   --  100 99 98 98  CO2 23  --   --  25 21* 24 24  GLUCOSE 123*  --   --  119* 94 107* 78  BUN 108*  --   --  66* 71* 77* 84*  CREATININE 4.15*  --   --  2.85* 3.26* 3.86* 4.62*  CALCIUM 8.0*  --   --  7.4* 7.4* 7.6* 7.6*  MG 2.3  --   --  2.1 2.1 1.7 1.8  PHOS 9.1*   < > 8.9* 5.7* 6.3* 6.1* 6.9*   < > = values in this interval not displayed.   GFR: Estimated Creatinine Clearance: 18.7 mL/min (A) (by C-G formula based on SCr of 4.62 mg/dL (H)). Liver Function Tests: Recent Labs  Lab 04/22/20 1112 04/23/20 0545 04/24/20 0508 04/25/20 0613  AST 40 32 36 36  ALT 113* 83* 60* 44  ALKPHOS 107 106 101 100  BILITOT 0.7 1.2 1.0 1.2  PROT 5.3* 5.3* 5.0* 4.9*  ALBUMIN 2.1* 2.1* 2.0* 1.9*   No results for input(s): LIPASE, AMYLASE in the last 168 hours. No results for input(s): AMMONIA in the last 168 hours. Coagulation Profile: No results for input(s): INR, PROTIME in the last 168 hours. Cardiac Enzymes: No results for input(s): CKTOTAL, CKMB, CKMBINDEX, TROPONINI in the last 168 hours. BNP (last 3 results) No results for input(s): PROBNP in the last 8760 hours. HbA1C: No results for input(s): HGBA1C in the last 72 hours. CBG: No results for input(s): GLUCAP in the last 168  hours. Lipid Profile: No results for input(s): CHOL, HDL, LDLCALC, TRIG, CHOLHDL, LDLDIRECT in the last 72 hours. Thyroid Function Tests: No results for input(s): TSH, T4TOTAL, FREET4, T3FREE, THYROIDAB in the last 72 hours. Anemia Panel: No results for input(s): VITAMINB12, FOLATE, FERRITIN, TIBC, IRON, RETICCTPCT in the last 72 hours. Sepsis Labs: No results for input(s): PROCALCITON, LATICACIDVEN in the last 168 hours.  Recent Results (from the past 240 hour(s))  CULTURE, BLOOD (ROUTINE X 2) w Reflex to ID  Panel     Status: None   Collection Time: 04/22/20  6:12 PM   Specimen: BLOOD  Result Value Ref Range Status   Specimen Description BLOOD FOOT  Final   Special Requests   Final    BOTTLES DRAWN AEROBIC AND ANAEROBIC Blood Culture adequate volume   Culture   Final    NO GROWTH 5 DAYS Performed at [facility], [address] [place], [place] [address]    Report Status 04/27/2020 FINAL  Final  CULTURE, BLOOD (ROUTINE X 2) w Reflex to ID Panel     Status: None   Collection Time: 04/22/20  6:17 PM   Specimen: BLOOD  Result Value Ref Range Status   Specimen Description BLOOD FOOT  Final   Special Requests   Final    BOTTLES DRAWN AEROBIC AND ANAEROBIC Blood Culture results may not be optimal due to an excessive volume of blood received in culture bottles   Culture   Final    NO GROWTH 5 DAYS Performed at [facility], [address] [place], [place] [address]    Report Status 04/27/2020 FINAL  Final         Radiology Studies: [facility] Chest Port 1 View  Result Date: 04/28/2020 CLINICAL DATA:  Sepsis. EXAM: PORTABLE CHEST 1 VIEW COMPARISON:  April 14, 2020. FINDINGS: The heart size and mediastinal contours are within normal limits. Both lungs are clear. No pneumothorax or pleural effusion is noted. Interval placement of right internal jugular catheter with distal tip in expected position of the SVC. The visualized skeletal structures are unremarkable.  IMPRESSION: No active disease. Electronically Signed   By: Marijo Conception M.D.   On: 04/28/2020 13:08        Scheduled Meds: . acidophilus  1 capsule Oral Daily  . alteplase  2 mg Intracatheter Once  . alteplase  2 mg Intracatheter Once  . amiodarone  200 mg Oral BID  . Chlorhexidine Gluconate Cloth  6 each Topical Daily  . docusate sodium  200 mg Oral BID  . dolutegravir  50 mg Oral Daily  . famotidine  10 mg Oral Q1200  . feeding supplement (NEPRO CARB STEADY)  237 mL Oral BID BM  . guaiFENesin-dextromethorphan  10 mL Oral Q8H  . heparin injection (subcutaneous)  5,000 Units Subcutaneous Q8H  . mometasone-formoterol  2 puff Inhalation BID  . multivitamin  1 tablet Oral QHS  . pregabalin  25 mg Oral Daily  . rilpivirine  25 mg Oral Q breakfast  . sodium chloride flush  5 mL Intracatheter Q8H  . sodium zirconium cyclosilicate  10 g Oral Daily   Continuous Infusions: . sodium chloride 250 mL (04/28/20 0536)  . meropenem (MERREM) IV 500 mg (04/28/20 2317)     LOS: 15 days    Time spent: 32 mins    Wyvonnia Dusky, MD [facility] Pager [phone]  If 7PM-7AM, please contact night-coverage [email or web address] 04/29/2020, 7:40 AM '

## 2020-04-29 NOTE — Progress Notes (Signed)
Pt requesting Ice water. Pt was educated on the importance of the needing to drink contract to be able to obtain a CT. Pt was encourage to drink the contrast. I was provided.  This nurse had to continue to encourage the pt to drink the PO contrast.

## 2020-04-30 ENCOUNTER — Encounter: Payer: Self-pay | Admitting: Family Medicine

## 2020-04-30 LAB — BASIC METABOLIC PANEL
Anion gap: 21 — ABNORMAL HIGH (ref 5–15)
BUN: 91 mg/dL — ABNORMAL HIGH (ref 6–20)
CO2: 15 mmol/L — ABNORMAL LOW (ref 22–32)
Calcium: 7.3 mg/dL — ABNORMAL LOW (ref 8.9–10.3)
Chloride: 101 mmol/L (ref 98–111)
Creatinine, Ser: 5.23 mg/dL — ABNORMAL HIGH (ref 0.44–1.00)
GFR calc Af Amer: 10 mL/min — ABNORMAL LOW (ref >60)
GFR calc non Af Amer: 8 mL/min — ABNORMAL LOW (ref >60)
Glucose, Bld: 64 mg/dL — ABNORMAL LOW (ref 70–99)
Potassium: 5.9 mmol/L — ABNORMAL HIGH (ref 3.5–5.1)
Sodium: 137 mmol/L (ref 135–145)

## 2020-04-30 LAB — CBC
HCT: 22.9 % — ABNORMAL LOW (ref 36.0–46.0)
Hemoglobin: 8.4 g/dL — ABNORMAL LOW (ref 12.0–15.0)
MCH: 31.8 pg (ref 26.0–34.0)
MCHC: 36.7 g/dL — ABNORMAL HIGH (ref 30.0–36.0)
MCV: 86.7 fL (ref 80.0–100.0)
Platelets: 135 10*3/uL — ABNORMAL LOW (ref 150–400)
RBC: 2.64 MIL/uL — ABNORMAL LOW (ref 3.87–5.11)
RDW: 17.5 % — ABNORMAL HIGH (ref 11.5–15.5)
WBC: 29.8 10*3/uL — ABNORMAL HIGH (ref 4.0–10.5)
nRBC: 0.1 % (ref 0.0–0.2)

## 2020-04-30 LAB — MAGNESIUM: Magnesium: 1.7 mg/dL (ref 1.7–2.4)

## 2020-04-30 LAB — PHOSPHORUS: Phosphorus: 8.5 mg/dL — ABNORMAL HIGH (ref 2.5–4.6)

## 2020-04-30 NOTE — Progress Notes (Signed)
Patients daughter at bedside, patients daughter tearful. Patient eating dinner. Patients daughter stated she is concerned about the care she is receiving as her mother (the patient) states that this nurse has been snappy and nasty with her. The patient then stated "do not pretend in front of my daughter, I know I am not your favorite patient". This nurse apologized and stated the only time she had a complaint/ felt the patient was upset from the patient is when the nurse had the patient turn to her side independently earlier in the shift to assess her skin. This nurse then apologized again.  The daughter then stated that she felt the patients hygiene is not being addressed appropriately and that patient told her she has been sitting in her urine for extended periods. The daughter was also concerned about patient mobility.   Gave patients daughter Directors card and again apologized to patient and daughter.

## 2020-04-30 NOTE — Progress Notes (Signed)
PT Cancellation Note  Patient Details Name: Melanie Bowen MRN: [id number] DOB: Mar 23, 1961   Cancelled Treatment:     PT attempt. Pt currently having HD. Acute PT will continue to follow and return at later time/date.    Willette Pa 04/30/2020, 10:24 AM

## 2020-04-30 NOTE — Progress Notes (Signed)
[facility]  ROUNDING NOTE   Subjective:   Hemodialysis treatment today due to hyperkalemia, metabolic acidosis and worsening kidney function.  Hypotensive on dialysis treatment.   Tmax 100.4   HEMODIALYSIS FLOWSHEET:  Blood Flow Rate (mL/min): 250 mL/min Arterial Pressure (mmHg): -210 mmHg Venous Pressure (mmHg): 220 mmHg Transmembrane Pressure (mmHg): 70 mmHg Ultrafiltration Rate (mL/min): 440 mL/min Dialysate Flow Rate (mL/min): 300 ml/min Conductivity: Machine : 13.9 Conductivity: Machine : 13.9 Dialysis Fluid Bolus: Normal Saline Bolus Amount (mL): 200 mL    Objective:  Vital signs in last 24 hours:  Temp:  [99.1 F (37.3 C)-100.4 F (38 C)] 99.1 F (37.3 C) (09/01 0736) Pulse Rate:  [110-118] 118 (09/01 1100) Resp:  [18-21] 21 (09/01 1100) BP: (111-125)/(60-73) 111/61 (09/01 1100) SpO2:  [90 %-95 %] 90 % (09/01 0736)  Weight change:  Filed Weights   04/25/20 0508 04/26/20 0458 04/29/20 0500  Weight: 133.1 kg 131.3 kg 134.4 kg    Intake/Output: I/O last 3 completed shifts: In: 1060 [P.O.:960; IV Piggyback:100] Out: 510 [Urine:400; Drains:110]   Intake/Output this shift:  Total I/O In: 120 [I.V.:120] Out: -   Physical Exam: General: Resting in bed, in no acute distress  Head: Oal mucosal membranes  Eyes: Anicteric  Neck: Supple, trachea at the midline  Lungs:  diminished at the bases  Heart: Regular rate and rhythm  Abdomen:  Obese, nontender,   Extremities:  1+ peripheral edema.  Neurologic: Alert, oriented, answers simple questions appropriately  Skin: No acute rashes or  lesions  Access: RIJ permcath 8/27 Dr. [name]    Basic Metabolic Panel: Recent Labs  Lab 04/26/20 7628 04/26/20 3151 04/27/20 7616 04/27/20 0737 04/28/20 0640 04/29/20 0457 04/30/20 0827  NA 136  --  133*  --  134* 134* 137  K 4.6  --  4.5  --  4.5 4.6 5.9*  CL 100  --  99  --  98 98 101  CO2 25  --  21*  --  24 24 15*  GLUCOSE 119*  --  94  --  107* 78  64*  BUN 66*  --  71*  --  77* 84* 91*  CREATININE 2.85*  --  3.26*  --  3.86* 4.62* 5.23*  CALCIUM 7.4*   < > 7.4*   < > 7.6* 7.6* 7.3*  MG 2.1  --  2.1  --  1.7 1.8 1.7  PHOS 5.7*  --  6.3*  --  6.1* 6.9* 8.5*   < > = values in this interval not displayed.    Liver Function Tests: Recent Labs  Lab 04/24/20 0508 04/25/20 0613  AST 36 36  ALT 60* 44  ALKPHOS 101 100  BILITOT 1.0 1.2  PROT 5.0* 4.9*  ALBUMIN 2.0* 1.9*   No results for input(s): LIPASE, AMYLASE in the last 168 hours. No results for input(s): AMMONIA in the last 168 hours.  CBC: Recent Labs  Lab 04/26/20 0552 04/27/20 0432 04/28/20 0622 04/29/20 0457 04/30/20 0827  WBC 26.0* 25.3* 33.1* 24.2* 29.8*  HGB 10.0* 8.9* 9.0* 8.3* 8.4*  HCT 30.3* 27.2* 27.2* 24.4* 22.9*  MCV 93.2 94.4 92.5 91.0 86.7  PLT 181 172 193 165 135*    Cardiac Enzymes: No results for input(s): CKTOTAL, CKMB, CKMBINDEX, TROPONINI in the last 168 hours.  BNP: Invalid input(s): POCBNP  CBG: No results for input(s): GLUCAP in the last 168 hours.  Microbiology: Results for orders placed or performed during the hospital encounter of 04/14/20  SARS  Coronavirus 2 by RT PCR (hospital order, performed in [facility] hospital lab) Nasopharyngeal Nasopharyngeal Swab     Status: None   Collection Time: 04/14/20  2:17 AM   Specimen: Nasopharyngeal Swab  Result Value Ref Range Status   SARS Coronavirus 2 NEGATIVE NEGATIVE Final    Comment: (NOTE) SARS-CoV-2 target nucleic acids are NOT DETECTED.  The SARS-CoV-2 RNA is generally detectable in upper and lower respiratory specimens during the acute phase of infection. The lowest concentration of SARS-CoV-2 viral copies this assay can detect is 250 copies / mL. A negative result does not preclude SARS-CoV-2 infection and should not be used as the sole basis for treatment or other patient management decisions.  A negative result may occur with improper specimen collection / handling,  submission of specimen other than nasopharyngeal swab, presence of viral mutation(s) within the areas targeted by this assay, and inadequate number of viral copies (<250 copies / mL). A negative result must be combined with clinical observations, patient history, and epidemiological information.  Fact Sheet for Patients:   StrictlyIdeas.no  Fact Sheet for Healthcare Providers: BankingDealers.co.za  This test is not yet approved or  cleared by the [place] FDA and has been authorized for detection and/or diagnosis of SARS-CoV-2 by FDA under an Emergency Use Authorization (EUA).  This EUA will remain in effect (meaning this test can be used) for the duration of the COVID-19 declaration under Section 564(b)(1) of the Act, 21 U.S.C. section 360bbb-3(b)(1), unless the authorization is terminated or revoked sooner.  Performed at [facility], [address] [place], [place] [address]   CULTURE, BLOOD (ROUTINE X 2) w Reflex to ID Panel     Status: None   Collection Time: 04/14/20  8:14 AM   Specimen: BLOOD  Result Value Ref Range Status   Specimen Description BLOOD LEFT [facility]  Final   Special Requests   Final    BOTTLES DRAWN AEROBIC AND ANAEROBIC Blood Culture adequate volume   Culture   Final    NO GROWTH 5 DAYS Performed at [facility], [address] [place], [place] [address]    Report Status 04/19/2020 FINAL  Final  CULTURE, BLOOD (ROUTINE X 2) w Reflex to ID Panel     Status: None   Collection Time: 04/14/20  8:14 AM   Specimen: BLOOD  Result Value Ref Range Status   Specimen Description BLOOD LEFT WRIST  Final   Special Requests   Final    BOTTLES DRAWN AEROBIC AND ANAEROBIC Blood Culture adequate volume   Culture   Final    NO GROWTH 5 DAYS Performed at [facility], [address], [place], [place] [address]    Report Status 04/19/2020 FINAL  Final  Aerobic/Anaerobic Culture  (surgical/deep wound)     Status: None   Collection Time: 04/14/20 11:14 AM   Specimen: Abscess  Result Value Ref Range Status   Specimen Description   Final    ABSCESS Performed at [facility], [address], [place], [place] [address]    Special Requests   Final    NONE Performed at [facility], [place], [place] [address]    Gram Stain   Final    NO WBC SEEN ABUNDANT GRAM NEGATIVE RODS FEW GRAM POSITIVE COCCI FEW GRAM POSITIVE RODS Performed at [facility], [place] [address], [place], [place] [address]    Culture   Final    ABUNDANT KLEBSIELLA PNEUMONIAE ABUNDANT PSEUDOMONAS AERUGINOSA  MODERATE ESCHERICHIA COLI Confirmed Extended Spectrum Beta-Lactamase Producer (ESBL).  In bloodstream infections from ESBL organisms, carbapenems are preferred over piperacillin/tazobactam. They are shown to have a lower risk of mortality. MIXED ANAEROBIC FLORA PRESENT.  CALL LAB IF FURTHER IID REQUIRED.    Report Status 04/20/2020 FINAL  Final   Organism ID, Bacteria KLEBSIELLA PNEUMONIAE  Final   Organism ID, Bacteria PSEUDOMONAS AERUGINOSA  Final   Organism ID, Bacteria ESCHERICHIA COLI  Final      Susceptibility   Escherichia coli - MIC*    AMPICILLIN >=32 RESISTANT Resistant     CEFAZOLIN >=64 RESISTANT Resistant     CEFEPIME 16 RESISTANT Resistant     CEFTAZIDIME RESISTANT Resistant     CEFTRIAXONE >=64 RESISTANT Resistant     CIPROFLOXACIN >=4 RESISTANT Resistant     GENTAMICIN <=1 SENSITIVE Sensitive     IMIPENEM <=0.25 SENSITIVE Sensitive     TRIMETH/SULFA >=320 RESISTANT Resistant     AMPICILLIN/SULBACTAM >=32 RESISTANT Resistant     PIP/TAZO 8 SENSITIVE Sensitive     * MODERATE ESCHERICHIA COLI   Klebsiella pneumoniae - MIC*    AMPICILLIN >=32 RESISTANT Resistant     CEFAZOLIN <=4 SENSITIVE Sensitive     CEFEPIME <=0.12 SENSITIVE Sensitive     CEFTAZIDIME <=1 SENSITIVE Sensitive     CEFTRIAXONE <=0.25 SENSITIVE Sensitive      CIPROFLOXACIN <=0.25 SENSITIVE Sensitive     GENTAMICIN <=1 SENSITIVE Sensitive     IMIPENEM <=0.25 SENSITIVE Sensitive     TRIMETH/SULFA <=20 SENSITIVE Sensitive     AMPICILLIN/SULBACTAM 16 INTERMEDIATE Intermediate     PIP/TAZO 8 SENSITIVE Sensitive     * ABUNDANT KLEBSIELLA PNEUMONIAE   Pseudomonas aeruginosa - MIC*    CEFTAZIDIME 4 SENSITIVE Sensitive     CIPROFLOXACIN <=0.25 SENSITIVE Sensitive     GENTAMICIN 2 SENSITIVE Sensitive     IMIPENEM 2 SENSITIVE Sensitive     PIP/TAZO 8 SENSITIVE Sensitive     CEFEPIME 4 SENSITIVE Sensitive     * ABUNDANT PSEUDOMONAS AERUGINOSA  MRSA PCR Screening     Status: None   Collection Time: 04/14/20  6:19 PM   Specimen: Nasopharyngeal  Result Value Ref Range Status   MRSA by PCR NEGATIVE NEGATIVE Final    Comment:        The GeneXpert MRSA Assay (FDA approved for NASAL specimens only), is one component of a comprehensive MRSA colonization surveillance program. It is not intended to diagnose MRSA infection nor to guide or monitor treatment for MRSA infections. Performed at [facility], [address], [place], [place] [address]   Urine Culture     Status: None   Collection Time: 04/15/20  4:28 AM   Specimen: Urine, Random  Result Value Ref Range Status   Specimen Description   Final    URINE, RANDOM Performed at [facility], [address], [place], [place] [address]    Special Requests   Final    NONE Performed at [facility], [address], [place], [place] [address]    Culture   Final    NO GROWTH Performed at [facility], [place] [address], [place],  [address]    Report Status 04/16/2020 FINAL  Final  CULTURE, BLOOD (ROUTINE X 2) w Reflex to ID Panel     Status: None   Collection Time: 04/22/20  6:12 PM   Specimen: BLOOD  Result Value Ref Range Status   Specimen Description BLOOD FOOT  Final   Special  Requests   Final    BOTTLES DRAWN AEROBIC AND ANAEROBIC Blood Culture  adequate volume   Culture   Final    NO GROWTH 5 DAYS Performed at [facility], [address] [place], [place] [address]    Report Status 04/27/2020 FINAL  Final  CULTURE, BLOOD (ROUTINE X 2) w Reflex to ID Panel     Status: None   Collection Time: 04/22/20  6:17 PM   Specimen: BLOOD  Result Value Ref Range Status   Specimen Description BLOOD FOOT  Final   Special Requests   Final    BOTTLES DRAWN AEROBIC AND ANAEROBIC Blood Culture results may not be optimal due to an excessive volume of blood received in culture bottles   Culture   Final    NO GROWTH 5 DAYS Performed at [facility], [address], [place], [place] [address]    Report Status 04/27/2020 FINAL  Final    Coagulation Studies: No results for input(s): LABPROT, INR in the last 72 hours.  Urinalysis: No results for input(s): COLORURINE, LABSPEC, PHURINE, GLUCOSEU, HGBUR, BILIRUBINUR, KETONESUR, PROTEINUR, UROBILINOGEN, NITRITE, LEUKOCYTESUR in the last 72 hours.  Invalid input(s): APPERANCEUR    Imaging: CT ABDOMEN PELVIS WO CONTRAST  Result Date: 04/29/2020 CLINICAL DATA:  Left lower quadrant abdominal pain. History of diverticulitis complicated by abscess. EXAM: CT ABDOMEN AND PELVIS WITHOUT CONTRAST TECHNIQUE: Multidetector CT imaging of the abdomen and pelvis was performed following the standard protocol without IV contrast. COMPARISON:  CT abdomen pelvis dated 04/18/2020. FINDINGS: Lower chest: There is a moderate right pleural effusion with associated atelectasis. There is mild left basilar atelectasis. Hepatobiliary: No focal liver abnormality is seen. No gallstones, gallbladder wall thickening, or biliary dilatation. Pancreas: Unremarkable. No pancreatic ductal dilatation or surrounding inflammatory changes. Spleen: Normal in size without focal abnormality. Adrenals/Urinary Tract: Adrenal glands are unremarkable. Kidneys are normal, without renal calculi, focal lesion, or hydronephrosis.  Bladder is unremarkable. Stomach/Bowel: Stomach is within normal limits. Enteric contrast reaches the rectum. Multiple mildly dilated loops of small bowel are seen in the mid abdomen without a transition point to decompressed distal bowel, similar to prior exam. Appendix appears normal. There is sigmoid diverticulosis without significant associated inflammatory changes. Vascular/Lymphatic: Aortic atherosclerosis. No enlarged abdominal or pelvic lymph nodes. Reproductive: Uterus and bilateral adnexa are unremarkable. Other: There is a moderate volume intraperitoneal fluid, increased since 04/18/2020. No definite well-defined fluid collection to suggest abscess is identified, however evaluation is limited due to the lack of intravenous contrast. A locule of gas in the lower midline abdomen (series 2, image 67) is favored to be located within a loop of bowel. A pigtail catheter is seen in the lower abdomen, slightly retracted since prior exam. Anasarca is noted. No abdominal wall hernia. Musculoskeletal: Spinal fusion hardware is seen from L3-L5. Degenerative changes are seen in the spine. IMPRESSION: 1. Moderate volume intraperitoneal fluid, increased since 04/18/2020. No definite well-defined fluid collection to suggest abscess is identified, however evaluation is limited due to the lack of intravenous contrast. A pigtail catheter has been partially retracted but remains located in the lower abdomen. 2. Multiple mildly dilated loops of small bowel in the mid abdomen are similar to prior exam and likely reflect ileus. 3. Moderate right pleural effusion with associated atelectasis. Aortic Atherosclerosis (ICD10-I70.0). Electronically Signed   By: Zerita Boers M.D.   On: 04/29/2020 19:47     Medications:   . sodium chloride 250 mL (04/29/20 2300)  . meropenem (MERREM) IV 500 mg (  04/30/20 1445)   . acidophilus  1 capsule Oral Daily  . alteplase  2 mg Intracatheter Once  . alteplase  2 mg Intracatheter Once   . amiodarone  200 mg Oral BID  . Chlorhexidine Gluconate Cloth  6 each Topical Daily  . docusate sodium  200 mg Oral BID  . rilpivirine  25 mg Oral Q breakfast   And  . dolutegravir  50 mg Oral Q breakfast  . famotidine  10 mg Oral Q1200  . feeding supplement (NEPRO CARB STEADY)  237 mL Oral BID BM  . guaiFENesin-dextromethorphan  10 mL Oral Q8H  . heparin injection (subcutaneous)  5,000 Units Subcutaneous Q8H  . mometasone-formoterol  2 puff Inhalation BID  . multivitamin  1 tablet Oral QHS  . pregabalin  25 mg Oral Daily  . sodium chloride flush  5 mL Intracatheter Q8H  . sodium zirconium cyclosilicate  10 g Oral Daily   acetaminophen **OR** acetaminophen, albuterol, ALPRAZolam, bisacodyl, heparin NICU/SCN flush, HYDROmorphone (DILAUDID) injection, iohexol, ipratropium-albuterol, labetalol, magic mouthwash **AND** lidocaine, magnesium hydroxide, menthol-cetylpyridinium, oxyCODONE-acetaminophen, phenol, simethicone  Assessment/ Plan:  [name] is a 59 y.o. black female with HIV, COPD, hypertension, GERD, who is admitted to [facility] on 04/14/2020 for Dyspnea [R06.00] Colonic diverticular abscess [K57.20] Diverticulitis of large intestine with abscess without bleeding [K57.20] Abscess of sigmoid colon due to diverticulitis [K57.20]  1. Acute renal failure with hyperkalemia: with no history of chronic kidney disease. History of bland urine.  Requiring hemodialysis. First treatment on 8/18. Seen and examined on hemodialysis.  IV contrast exposure on 8/16. Ultrasound reviewed, no evidence of HIV induced nephropathy - Resume MWF schedule and monitor for renal recovery.  - lokelma   2. Hypertension: hypotensive episode on dialysis. Holding blood pressure agents. Home regimen of losartan, hydrochlorothiazide and amlodipine being held.   3. Anemia of kidney failure: hemoglobin 8.4 - will initiate epo with next dialysis treatment.    LOS: [place] 9/1/20212:50 PM

## 2020-04-30 NOTE — Progress Notes (Addendum)
Progress Note    Melanie Bowen  SWN:[id number] DOB: May 10, 1961  DOA: 04/14/2020 PCP: Theotis Burrow, MD      Brief Narrative:    Medical records reviewed and are as summarized below:  Melanie Bowen is a 59 y.o. female       Assessment/Plan:   Principal Problem:   Severe sepsis with septic shock ([place]) Active Problems:   HTN (hypertension)   HIV (human immunodeficiency virus infection) ([place])   Diverticulitis of large intestine with abscess without bleeding   Abscess of sigmoid colon due to diverticulitis   Acute renal failure (ARF) ([place])   Hypotension   Diverticulosis   Acute respiratory distress   DNR (do not resuscitate) discussion   Palliative care by specialist   Dyspnea    Nutrition Problem: Increased nutrient needs Etiology: chronic illness (COPD, new HD)  Signs/Symptoms: estimated needs   Body mass index is 47.84 kg/m.  (Morbid obesity): This complicates overall care and prognosis   Septic shock - resolved  Acute sigmoid diverticulitis and diverticular abscess, leukocytosis- s/p CT-guided drainage by IR on 04/14/2020  Gallbladder abscess-fluid culture showed Klebsiella pneumonia, Pseudomonas aeruginosa and E. coli  Acute kidney injury complicated by hyperkalemia and metabolic acidosis  Atrial fibrillation with RVR-converted to normal sinus rhythm  COPD exacerbation-resolved  HIV infection  Acute liver failure-resolved  Acute hypoxemic respiratory failure-resolved  Metabolic encephalopathy-resolved  PLAN  Continue empiric IV antibiotics Follow-up with ID and general surgeon Patient is still requiring hemodialysis for AKI.  Follow-up with nephrologist for further recommendations Continue antiretroviral therapy Continue bronchodilators She is on amiodarone for atrial fibrillation.  Consideration for long-term anticoagulation in the future   Diet Order            Diet renal with fluid restriction Room service  appropriate? Yes; Fluid consistency: Thin  Diet effective now                    Consultants:  General surgeon  Cardiologist  Nephrologist  Vascular surgeon  Procedures:  CT-guided drainage of diverticular abscess on 04/14/2020    Medications:   . acidophilus  1 capsule Oral Daily  . alteplase  2 mg Intracatheter Once  . alteplase  2 mg Intracatheter Once  . amiodarone  200 mg Oral BID  . Chlorhexidine Gluconate Cloth  6 each Topical Daily  . docusate sodium  200 mg Oral BID  . rilpivirine  25 mg Oral Q breakfast   And  . dolutegravir  50 mg Oral Q breakfast  . famotidine  10 mg Oral Q1200  . feeding supplement (NEPRO CARB STEADY)  237 mL Oral BID BM  . guaiFENesin-dextromethorphan  10 mL Oral Q8H  . heparin injection (subcutaneous)  5,000 Units Subcutaneous Q8H  . mometasone-formoterol  2 puff Inhalation BID  . multivitamin  1 tablet Oral QHS  . pregabalin  25 mg Oral Daily  . sodium chloride flush  5 mL Intracatheter Q8H  . sodium zirconium cyclosilicate  10 g Oral Daily   Continuous Infusions: . sodium chloride 250 mL (04/29/20 2300)  . meropenem (MERREM) IV 500 mg (04/29/20 2307)     Anti-infectives (From admission, onward)   Start     Dose/Rate Route Frequency Ordered Stop   04/30/20 0800  rilpivirine (EDURANT) tablet 25 mg       "And" Linked Group Details   25 mg Oral Daily with breakfast 04/29/20 1254     04/30/20 0800  dolutegravir (TIVICAY) tablet  50 mg       "And" Linked Group Details   50 mg Oral Daily with breakfast 04/29/20 1254     04/28/20 2200  meropenem (MERREM) 500 mg in sodium chloride 0.9 % 100 mL IVPB        500 mg 200 mL/hr over 30 Minutes Intravenous Every 12 hours 04/28/20 1546     04/26/20 2200  meropenem (MERREM) 500 mg in sodium chloride 0.9 % 100 mL IVPB  Status:  Discontinued        500 mg 200 mL/hr over 30 Minutes Intravenous Every 8 hours 04/26/20 1258 04/28/20 1546   04/25/20 1700  rilpivirine (EDURANT) tablet 25 mg   Status:  Discontinued        25 mg Oral Daily with breakfast 04/24/20 1636 04/25/20 1111   04/25/20 1200  rilpivirine (EDURANT) tablet 25 mg  Status:  Discontinued        25 mg Oral Daily with breakfast 04/25/20 1111 04/29/20 1254   04/25/20 1000  meropenem (MERREM) 500 mg in sodium chloride 0.9 % 100 mL IVPB  Status:  Discontinued        500 mg 200 mL/hr over 30 Minutes Intravenous Every 12 hours 04/25/20 0935 04/26/20 1258   04/24/20 1700  darunavir-cobicistat (PREZCOBIX) 800-150 MG per tablet 1 tablet  Status:  Discontinued        1 tablet Oral Daily with breakfast 04/24/20 1231 04/24/20 1636   04/24/20 1330  dolutegravir (TIVICAY) tablet 50 mg  Status:  Discontinued        50 mg Oral Daily 04/24/20 1231 04/29/20 1254   04/23/20 2200  meropenem (MERREM) 500 mg in sodium chloride 0.9 % 100 mL IVPB  Status:  Discontinued        500 mg 200 mL/hr over 30 Minutes Intravenous Every 24 hours 04/23/20 1127 04/25/20 0935   04/18/20 2000  levofloxacin (LEVAQUIN) IVPB 750 mg        750 mg 100 mL/hr over 90 Minutes Intravenous  Once 04/18/20 1857 04/19/20 0004   04/18/20 1800  levofloxacin (LEVAQUIN) IVPB 750 mg  Status:  Discontinued        750 mg 100 mL/hr over 90 Minutes Intravenous  Once 04/18/20 1640 04/18/20 1857   04/16/20 2200  anidulafungin (ERAXIS) 100 mg in sodium chloride 0.9 % 100 mL IVPB        100 mg 78 mL/hr over 100 Minutes Intravenous Every 24 hours 04/16/20 1707 04/22/20 0211   04/15/20 2200  valACYclovir (VALTREX) tablet 500 mg  Status:  Discontinued        500 mg Oral Daily at bedtime 04/15/20 1248 04/16/20 1259   04/15/20 2200  anidulafungin (ERAXIS) 200 mg in sodium chloride 0.9 % 200 mL IVPB        200 mg 78 mL/hr over 200 Minutes Intravenous  Once 04/15/20 2008 04/16/20 0327   04/15/20 2200  meropenem (MERREM) 1 g in sodium chloride 0.9 % 100 mL IVPB  Status:  Discontinued        1 g 200 mL/hr over 30 Minutes Intravenous Every 12 hours 04/15/20 2017 04/23/20 1127    04/15/20 1400  piperacillin-tazobactam (ZOSYN) IVPB 3.375 g  Status:  Discontinued        3.375 g 12.5 mL/hr over 240 Minutes Intravenous Every 8 hours 04/15/20 1300 04/15/20 2006   04/14/20 2200  elvitegravir-cobicistat-emtricitabine-tenofovir (GENVOYA) 150-150-200-10 MG tablet 1 tablet  Status:  Discontinued        1 tablet Oral Daily at  bedtime 04/14/20 0235 04/15/20 0957   04/14/20 2200  valACYclovir (VALTREX) tablet 1,000 mg  Status:  Discontinued        1,000 mg Oral Daily at bedtime 04/14/20 0235 04/15/20 1248   04/14/20 1230  cefTRIAXone (ROCEPHIN) 2 g in sodium chloride 0.9 % 100 mL IVPB  Status:  Discontinued        2 g 200 mL/hr over 30 Minutes Intravenous Every 24 hours 04/14/20 1223 04/15/20 1300   04/14/20 1230  metroNIDAZOLE (FLAGYL) IVPB 500 mg  Status:  Discontinued        500 mg 100 mL/hr over 60 Minutes Intravenous Every 8 hours 04/14/20 1223 04/15/20 1636   04/14/20 1030  piperacillin-tazobactam (ZOSYN) IVPB 3.375 g  Status:  Discontinued        3.375 g 12.5 mL/hr over 240 Minutes Intravenous Every 8 hours 04/14/20 0235 04/14/20 1223   04/14/20 0215  piperacillin-tazobactam (ZOSYN) IVPB 4.5 g  Status:  Discontinued        4.5 g 200 mL/hr over 30 Minutes Intravenous  Once 04/14/20 0200 04/14/20 0208   04/14/20 0215  piperacillin-tazobactam (ZOSYN) IVPB 3.375 g        3.375 g 100 mL/hr over 30 Minutes Intravenous  Once 04/14/20 0208 04/14/20 0301             Family Communication/Anticipated D/C date and plan/Code Status   DVT prophylaxis: heparin injection 5,000 Units Start: 04/23/20 0800     Code Status: Partial Code  Family Communication: Plan discussed with patient Disposition Plan:    Status is: Inpatient  Remains inpatient appropriate because:Inpatient level of care appropriate due to severity of illness and Requiring hemodialysis for acute kidney injury   Dispo: The patient is from: Home              Anticipated d/c is to: SNF               Anticipated d/c date is: 3 days              Patient currently is not medically stable to d/c.           Subjective:   No shortness of breath, chest pain, nausea vomiting  Objective:    Vitals:   04/29/20 2230 04/30/20 0300 04/30/20 0736 04/30/20 1100  BP: 125/73 116/69 125/60 111/61  Pulse: (!) 110 (!) 110 (!) 110 (!) 118  Resp:  18 20 (!) 21  Temp: (!) 100.4 F (38 C) 99.6 F (37.6 C) 99.1 F (37.3 C)   TempSrc:  Oral Oral   SpO2: 95% 90% 90%   Weight:      Height:       No data found.   Intake/Output Summary (Last 24 hours) at 04/30/2020 1436 Last data filed at 04/30/2020 1100 Gross per 24 hour  Intake 120 ml  Output 410 ml  Net -290 ml   Filed Weights   04/25/20 0508 04/26/20 0458 04/29/20 0500  Weight: 133.1 kg 131.3 kg 134.4 kg    Exam:  GEN: NAD SKIN: No rash EYES: EOMI ENT: MMM CV: RRR PULM: CTA B ABD: soft, obese, NT, +BS CNS: AAO x 3, non focal EXT: B/l leg edema, no tenderness   Data Reviewed:   I have personally reviewed following labs and imaging studies:  Labs: Labs show the following:   Basic Metabolic Panel: Recent Labs  Lab 04/26/20 0552 04/26/20 6222 04/27/20 0432 04/27/20 0432 04/28/20 0640 04/28/20 0640 04/29/20 0457 04/30/20 0827  NA 136  --  133*  --  134*  --  134* 137  K 4.6   < > 4.5   < > 4.5   < > 4.6 5.9*  CL 100  --  99  --  98  --  98 101  CO2 25  --  21*  --  24  --  24 15*  GLUCOSE 119*  --  94  --  107*  --  78 64*  BUN 66*  --  71*  --  77*  --  84* 91*  CREATININE 2.85*  --  3.26*  --  3.86*  --  4.62* 5.23*  CALCIUM 7.4*  --  7.4*  --  7.6*  --  7.6* 7.3*  MG 2.1  --  2.1  --  1.7  --  1.8 1.7  PHOS 5.7*  --  6.3*  --  6.1*  --  6.9* 8.5*   < > = values in this interval not displayed.   GFR Estimated Creatinine Clearance: 16.5 mL/min (A) (by C-G formula based on SCr of 5.23 mg/dL (H)). Liver Function Tests: Recent Labs  Lab 04/24/20 0508 04/25/20 0613  AST 36 36  ALT 60* 44  ALKPHOS 101  100  BILITOT 1.0 1.2  PROT 5.0* 4.9*  ALBUMIN 2.0* 1.9*   No results for input(s): LIPASE, AMYLASE in the last 168 hours. No results for input(s): AMMONIA in the last 168 hours. Coagulation profile No results for input(s): INR, PROTIME in the last 168 hours.  CBC: Recent Labs  Lab 04/26/20 0552 04/27/20 0432 04/28/20 0622 04/29/20 0457 04/30/20 0827  WBC 26.0* 25.3* 33.1* 24.2* 29.8*  HGB 10.0* 8.9* 9.0* 8.3* 8.4*  HCT 30.3* 27.2* 27.2* 24.4* 22.9*  MCV 93.2 94.4 92.5 91.0 86.7  PLT 181 172 193 165 135*   Cardiac Enzymes: No results for input(s): CKTOTAL, CKMB, CKMBINDEX, TROPONINI in the last 168 hours. BNP (last 3 results) No results for input(s): PROBNP in the last 8760 hours. CBG: No results for input(s): GLUCAP in the last 168 hours. D-Dimer: No results for input(s): DDIMER in the last 72 hours. Hgb A1c: No results for input(s): HGBA1C in the last 72 hours. Lipid Profile: No results for input(s): CHOL, HDL, LDLCALC, TRIG, CHOLHDL, LDLDIRECT in the last 72 hours. Thyroid function studies: No results for input(s): TSH, T4TOTAL, T3FREE, THYROIDAB in the last 72 hours.  Invalid input(s): FREET3 Anemia work up: No results for input(s): VITAMINB12, FOLATE, FERRITIN, TIBC, IRON, RETICCTPCT in the last 72 hours. Sepsis Labs: Recent Labs  Lab 04/27/20 0432 04/28/20 0622 04/29/20 0457 04/30/20 0827  WBC 25.3* 33.1* 24.2* 29.8*    Microbiology Recent Results (from the past 240 hour(s))  CULTURE, BLOOD (ROUTINE X 2) w Reflex to ID Panel     Status: None   Collection Time: 04/22/20  6:12 PM   Specimen: BLOOD  Result Value Ref Range Status   Specimen Description BLOOD FOOT  Final   Special Requests   Final    BOTTLES DRAWN AEROBIC AND ANAEROBIC Blood Culture adequate volume   Culture   Final    NO GROWTH 5 DAYS Performed at [facility], [address] [place], [place] [address]    Report Status 04/27/2020 FINAL  Final  CULTURE, BLOOD (ROUTINE X 2)  w Reflex to ID Panel     Status: None   Collection Time: 04/22/20  6:17 PM   Specimen: BLOOD  Result Value Ref Range Status   Specimen Description BLOOD FOOT  Final   Special Requests   Final    BOTTLES DRAWN AEROBIC AND ANAEROBIC Blood Culture results may not be optimal due to an excessive volume of blood received in culture bottles   Culture   Final    NO GROWTH 5 DAYS Performed at [facility], [address], [place], [place] [address]    Report Status 04/27/2020 FINAL  Final    Procedures and diagnostic studies:  CT ABDOMEN PELVIS WO CONTRAST  Result Date: 04/29/2020 CLINICAL DATA:  Left lower quadrant abdominal pain. History of diverticulitis complicated by abscess. EXAM: CT ABDOMEN AND PELVIS WITHOUT CONTRAST TECHNIQUE: Multidetector CT imaging of the abdomen and pelvis was performed following the standard protocol without IV contrast. COMPARISON:  CT abdomen pelvis dated 04/18/2020. FINDINGS: Lower chest: There is a moderate right pleural effusion with associated atelectasis. There is mild left basilar atelectasis. Hepatobiliary: No focal liver abnormality is seen. No gallstones, gallbladder wall thickening, or biliary dilatation. Pancreas: Unremarkable. No pancreatic ductal dilatation or surrounding inflammatory changes. Spleen: Normal in size without focal abnormality. Adrenals/Urinary Tract: Adrenal glands are unremarkable. Kidneys are normal, without renal calculi, focal lesion, or hydronephrosis. Bladder is unremarkable. Stomach/Bowel: Stomach is within normal limits. Enteric contrast reaches the rectum. Multiple mildly dilated loops of small bowel are seen in the mid abdomen without a transition point to decompressed distal bowel, similar to prior exam. Appendix appears normal. There is sigmoid diverticulosis without significant associated inflammatory changes. Vascular/Lymphatic: Aortic atherosclerosis. No enlarged abdominal or pelvic lymph nodes. Reproductive: Uterus and  bilateral adnexa are unremarkable. Other: There is a moderate volume intraperitoneal fluid, increased since 04/18/2020. No definite well-defined fluid collection to suggest abscess is identified, however evaluation is limited due to the lack of intravenous contrast. A locule of gas in the lower midline abdomen (series 2, image 67) is favored to be located within a loop of bowel. A pigtail catheter is seen in the lower abdomen, slightly retracted since prior exam. Anasarca is noted. No abdominal wall hernia. Musculoskeletal: Spinal fusion hardware is seen from L3-L5. Degenerative changes are seen in the spine. IMPRESSION: 1. Moderate volume intraperitoneal fluid, increased since 04/18/2020. No definite well-defined fluid collection to suggest abscess is identified, however evaluation is limited due to the lack of intravenous contrast. A pigtail catheter has been partially retracted but remains located in the lower abdomen. 2. Multiple mildly dilated loops of small bowel in the mid abdomen are similar to prior exam and likely reflect ileus. 3. Moderate right pleural effusion with associated atelectasis. Aortic Atherosclerosis (ICD10-I70.0). Electronically Signed   By: Zerita Boers M.D.   On: 04/29/2020 19:47               LOS: 16 days   [facility]   Pager on [email or web address]. If 7PM-7AM, please contact night-coverage at [email or web address]     04/30/2020, 2:36 PM

## 2020-04-30 NOTE — Progress Notes (Signed)
[facility] INFECTIOUS DISEASE PROGRESS NOTE Date of Admission:  04/14/2020     ID: ADARA KITTLE is a 59 y.o. female with sepsis Principal Problem:   Severe sepsis with septic shock ([place]) Active Problems:   HTN (hypertension)   HIV (human immunodeficiency virus infection) ([place])   Diverticulitis of large intestine with abscess without bleeding   Abscess of sigmoid colon due to diverticulitis   Acute renal failure (ARF) ([place])   Hypotension   Diverticulosis   Acute respiratory distress   DNR (do not resuscitate) discussion   Palliative care by specialist   Dyspnea   Subjective: Low grade fever, wbc remains elevated . Had HD and less confused.  Pigtail cath continues with thick purulent drainage.  ROS  Unable to obtain. Pt confused.   Medications:  Antibiotics Given (last 72 hours)    Date/Time Action Medication Dose Rate   04/27/20 2316 New Bag/Given   meropenem (MERREM) 500 mg in sodium chloride 0.9 % 100 mL IVPB 500 mg 200 mL/hr   04/28/20 0538 New Bag/Given   meropenem (MERREM) 500 mg in sodium chloride 0.9 % 100 mL IVPB 500 mg 200 mL/hr   04/28/20 9622 Given   dolutegravir (TIVICAY) tablet 50 mg 50 mg    04/28/20 1503 New Bag/Given   meropenem (MERREM) 500 mg in sodium chloride 0.9 % 100 mL IVPB 500 mg 200 mL/hr   04/28/20 1534 Given   rilpivirine (EDURANT) tablet 25 mg 25 mg    04/28/20 2317 New Bag/Given   meropenem (MERREM) 500 mg in sodium chloride 0.9 % 100 mL IVPB 500 mg 200 mL/hr   04/29/20 2979 Given   dolutegravir (TIVICAY) tablet 50 mg 50 mg    04/29/20 0918 Given   rilpivirine (EDURANT) tablet 25 mg 25 mg    04/29/20 0920 New Bag/Given   meropenem (MERREM) 500 mg in sodium chloride 0.9 % 100 mL IVPB 500 mg 200 mL/hr   04/29/20 2307 New Bag/Given   meropenem (MERREM) 500 mg in sodium chloride 0.9 % 100 mL IVPB 500 mg 200 mL/hr   04/30/20 1426 Given   rilpivirine (EDURANT) tablet 25 mg 25 mg    04/30/20 1427 Given   dolutegravir (TIVICAY) tablet 50  mg 50 mg    04/30/20 1445 New Bag/Given   meropenem (MERREM) 500 mg in sodium chloride 0.9 % 100 mL IVPB 500 mg 200 mL/hr     . acidophilus  1 capsule Oral Daily  . alteplase  2 mg Intracatheter Once  . alteplase  2 mg Intracatheter Once  . amiodarone  200 mg Oral BID  . Chlorhexidine Gluconate Cloth  6 each Topical Daily  . docusate sodium  200 mg Oral BID  . rilpivirine  25 mg Oral Q breakfast   And  . dolutegravir  50 mg Oral Q breakfast  . famotidine  10 mg Oral Q1200  . feeding supplement (NEPRO CARB STEADY)  237 mL Oral BID BM  . guaiFENesin-dextromethorphan  10 mL Oral Q8H  . heparin injection (subcutaneous)  5,000 Units Subcutaneous Q8H  . mometasone-formoterol  2 puff Inhalation BID  . multivitamin  1 tablet Oral QHS  . pregabalin  25 mg Oral Daily  . sodium chloride flush  5 mL Intracatheter Q8H  . sodium zirconium cyclosilicate  10 g Oral Daily    Objective: Vital signs in last 24 hours: Temp:  [98.4 F (36.9 C)-100.4 F (38 C)] 98.4 F (36.9 C) (09/01 1536) Pulse Rate:  [109-118] 109 (09/01 1536)  Resp:  [18-21] 19 (09/01 1536) BP: (111-126)/(60-73) 126/70 (09/01 1536) SpO2:  [90 %-97 %] 97 % (09/01 1536) General: Alert, cooperative, no distress, Lungs: b/l air entry Heart: Regular rate and rhythm, no murmur, rub or gallop. Abdomen: Soft, non-tender,not distended. Drain present left lower quadrant Extremities: atraumatic, no cyanosis. No edema. No clubbing Skin: No rashes or lesions. Or bruising Lymph: Cervical, supraclavicular normal. Neurologic: Grossly non-focal   Lab Results Recent Labs    04/29/20 0457 04/30/20 0827  WBC 24.2* 29.8*  HGB 8.3* 8.4*  HCT 24.4* 22.9*  NA 134* 137  K 4.6 5.9*  CL 98 101  CO2 24 15*  BUN 84* 91*  CREATININE 4.62* 5.23*    Microbiology: Results for orders placed or performed during the hospital encounter of 04/14/20  SARS Coronavirus 2 by RT PCR (hospital order, performed in [facility] hospital lab)  Nasopharyngeal Nasopharyngeal Swab     Status: None   Collection Time: 04/14/20  2:17 AM   Specimen: Nasopharyngeal Swab  Result Value Ref Range Status   SARS Coronavirus 2 NEGATIVE NEGATIVE Final    Comment: (NOTE) SARS-CoV-2 target nucleic acids are NOT DETECTED.  The SARS-CoV-2 RNA is generally detectable in upper and lower respiratory specimens during the acute phase of infection. The lowest concentration of SARS-CoV-2 viral copies this assay can detect is 250 copies / mL. A negative result does not preclude SARS-CoV-2 infection and should not be used as the sole basis for treatment or other patient management decisions.  A negative result may occur with improper specimen collection / handling, submission of specimen other than nasopharyngeal swab, presence of viral mutation(s) within the areas targeted by this assay, and inadequate number of viral copies (<250 copies / mL). A negative result must be combined with clinical observations, patient history, and epidemiological information.  Fact Sheet for Patients:   StrictlyIdeas.no  Fact Sheet for Healthcare Providers: BankingDealers.co.za  This test is not yet approved or  cleared by the [place] FDA and has been authorized for detection and/or diagnosis of SARS-CoV-2 by FDA under an Emergency Use Authorization (EUA).  This EUA will remain in effect (meaning this test can be used) for the duration of the COVID-19 declaration under Section 564(b)(1) of the Act, 21 U.S.C. section 360bbb-3(b)(1), unless the authorization is terminated or revoked sooner.  Performed at [facility], [address] [place], [place] [address]   CULTURE, BLOOD (ROUTINE X 2) w Reflex to ID Panel     Status: None   Collection Time: 04/14/20  8:14 AM   Specimen: BLOOD  Result Value Ref Range Status   Specimen Description BLOOD LEFT [facility]  Final   Special Requests   Final    BOTTLES DRAWN  AEROBIC AND ANAEROBIC Blood Culture adequate volume   Culture   Final    NO GROWTH 5 DAYS Performed at [facility], [address] [place], [place] [address]    Report Status 04/19/2020 FINAL  Final  CULTURE, BLOOD (ROUTINE X 2) w Reflex to ID Panel     Status: None   Collection Time: 04/14/20  8:14 AM   Specimen: BLOOD  Result Value Ref Range Status   Specimen Description BLOOD LEFT WRIST  Final   Special Requests   Final    BOTTLES DRAWN AEROBIC AND ANAEROBIC Blood Culture adequate volume   Culture   Final    NO GROWTH 5 DAYS Performed at [facility], [address] [place], [place] [address]  Report Status 04/19/2020 FINAL  Final  Aerobic/Anaerobic Culture (surgical/deep wound)     Status: None   Collection Time: 04/14/20 11:14 AM   Specimen: Abscess  Result Value Ref Range Status   Specimen Description   Final    ABSCESS Performed at [facility], [address] [place], [place] [address]    Special Requests   Final    NONE Performed at [facility], [place], [place] [address]    Gram Stain   Final    NO WBC SEEN ABUNDANT GRAM NEGATIVE RODS FEW GRAM POSITIVE COCCI FEW GRAM POSITIVE RODS Performed at [facility], [place] [address] [place],  [address]    Culture   Final    ABUNDANT KLEBSIELLA PNEUMONIAE ABUNDANT PSEUDOMONAS AERUGINOSA MODERATE ESCHERICHIA COLI Confirmed Extended Spectrum Beta-Lactamase Producer (ESBL).  In bloodstream infections from ESBL organisms, carbapenems are preferred over piperacillin/tazobactam. They are shown to have a lower risk of mortality. MIXED ANAEROBIC FLORA PRESENT.  CALL LAB IF FURTHER IID REQUIRED.    Report Status 04/20/2020 FINAL  Final   Organism ID, Bacteria KLEBSIELLA PNEUMONIAE  Final   Organism ID, Bacteria PSEUDOMONAS AERUGINOSA  Final   Organism ID, Bacteria ESCHERICHIA COLI  Final      Susceptibility   Escherichia coli - MIC*    AMPICILLIN >=32  RESISTANT Resistant     CEFAZOLIN >=64 RESISTANT Resistant     CEFEPIME 16 RESISTANT Resistant     CEFTAZIDIME RESISTANT Resistant     CEFTRIAXONE >=64 RESISTANT Resistant     CIPROFLOXACIN >=4 RESISTANT Resistant     GENTAMICIN <=1 SENSITIVE Sensitive     IMIPENEM <=0.25 SENSITIVE Sensitive     TRIMETH/SULFA >=320 RESISTANT Resistant     AMPICILLIN/SULBACTAM >=32 RESISTANT Resistant     PIP/TAZO 8 SENSITIVE Sensitive     * MODERATE ESCHERICHIA COLI   Klebsiella pneumoniae - MIC*    AMPICILLIN >=32 RESISTANT Resistant     CEFAZOLIN <=4 SENSITIVE Sensitive     CEFEPIME <=0.12 SENSITIVE Sensitive     CEFTAZIDIME <=1 SENSITIVE Sensitive     CEFTRIAXONE <=0.25 SENSITIVE Sensitive     CIPROFLOXACIN <=0.25 SENSITIVE Sensitive     GENTAMICIN <=1 SENSITIVE Sensitive     IMIPENEM <=0.25 SENSITIVE Sensitive     TRIMETH/SULFA <=20 SENSITIVE Sensitive     AMPICILLIN/SULBACTAM 16 INTERMEDIATE Intermediate     PIP/TAZO 8 SENSITIVE Sensitive     * ABUNDANT KLEBSIELLA PNEUMONIAE   Pseudomonas aeruginosa - MIC*    CEFTAZIDIME 4 SENSITIVE Sensitive     CIPROFLOXACIN <=0.25 SENSITIVE Sensitive     GENTAMICIN 2 SENSITIVE Sensitive     IMIPENEM 2 SENSITIVE Sensitive     PIP/TAZO 8 SENSITIVE Sensitive     CEFEPIME 4 SENSITIVE Sensitive     * ABUNDANT PSEUDOMONAS AERUGINOSA  MRSA PCR Screening     Status: None   Collection Time: 04/14/20  6:19 PM   Specimen: Nasopharyngeal  Result Value Ref Range Status   MRSA by PCR NEGATIVE NEGATIVE Final    Comment:        The GeneXpert MRSA Assay (FDA approved for NASAL specimens only), is one component of a comprehensive MRSA colonization surveillance program. It is not intended to diagnose MRSA infection nor to guide or monitor treatment for MRSA infections. Performed at [facility], [address], [place],  [address]   Urine Culture     Status: None   Collection Time: 04/15/20  4:28 AM   Specimen:  Urine, Random  Result Value  Ref Range Status   Specimen Description   Final    URINE, RANDOM Performed at [facility], [address], [place], [place] [address]    Special Requests   Final    NONE Performed at [facility], [address], [place], [place] [address]    Culture   Final    NO GROWTH Performed at [facility], [place] [address], [place], [place] [address]    Report Status 04/16/2020 FINAL  Final  CULTURE, BLOOD (ROUTINE X 2) w Reflex to ID Panel     Status: None   Collection Time: 04/22/20  6:12 PM   Specimen: BLOOD  Result Value Ref Range Status   Specimen Description BLOOD FOOT  Final   Special Requests   Final    BOTTLES DRAWN AEROBIC AND ANAEROBIC Blood Culture adequate volume   Culture   Final    NO GROWTH 5 DAYS Performed at [facility], [address] [place], [place] [address]    Report Status 04/27/2020 FINAL  Final  CULTURE, BLOOD (ROUTINE X 2) w Reflex to ID Panel     Status: None   Collection Time: 04/22/20  6:17 PM   Specimen: BLOOD  Result Value Ref Range Status   Specimen Description BLOOD FOOT  Final   Special Requests   Final    BOTTLES DRAWN AEROBIC AND ANAEROBIC Blood Culture results may not be optimal due to an excessive volume of blood received in culture bottles   Culture   Final    NO GROWTH 5 DAYS Performed at [facility], [address] [place],  [address]    Report Status 04/27/2020 FINAL  Final    Studies/Results: CT ABDOMEN PELVIS WO CONTRAST  Result Date: 04/29/2020 CLINICAL DATA:  Left lower quadrant abdominal pain. History of diverticulitis complicated by abscess. EXAM: CT ABDOMEN AND PELVIS WITHOUT CONTRAST TECHNIQUE: Multidetector CT imaging of the abdomen and pelvis was performed following the standard protocol without IV contrast. COMPARISON:  CT abdomen pelvis dated 04/18/2020. FINDINGS: Lower chest: There is a moderate right pleural effusion with associated atelectasis. There is mild left basilar  atelectasis. Hepatobiliary: No focal liver abnormality is seen. No gallstones, gallbladder wall thickening, or biliary dilatation. Pancreas: Unremarkable. No pancreatic ductal dilatation or surrounding inflammatory changes. Spleen: Normal in size without focal abnormality. Adrenals/Urinary Tract: Adrenal glands are unremarkable. Kidneys are normal, without renal calculi, focal lesion, or hydronephrosis. Bladder is unremarkable. Stomach/Bowel: Stomach is within normal limits. Enteric contrast reaches the rectum. Multiple mildly dilated loops of small bowel are seen in the mid abdomen without a transition point to decompressed distal bowel, similar to prior exam. Appendix appears normal. There is sigmoid diverticulosis without significant associated inflammatory changes. Vascular/Lymphatic: Aortic atherosclerosis. No enlarged abdominal or pelvic lymph nodes. Reproductive: Uterus and bilateral adnexa are unremarkable. Other: There is a moderate volume intraperitoneal fluid, increased since 04/18/2020. No definite well-defined fluid collection to suggest abscess is identified, however evaluation is limited due to the lack of intravenous contrast. A locule of gas in the lower midline abdomen (series 2, image 67) is favored to be located within a loop of bowel. A pigtail catheter is seen in the lower abdomen, slightly retracted since prior exam. Anasarca is noted. No abdominal wall hernia. Musculoskeletal: Spinal fusion hardware is seen from L3-L5. Degenerative changes are seen in the spine. IMPRESSION: 1. Moderate volume intraperitoneal fluid, increased since 04/18/2020. No definite well-defined fluid collection to suggest abscess  is identified, however evaluation is limited due to the lack of intravenous contrast. A pigtail catheter has been partially retracted but remains located in the lower abdomen. 2. Multiple mildly dilated loops of small bowel in the mid abdomen are similar to prior exam and likely reflect ileus.  3. Moderate right pleural effusion with associated atelectasis. Aortic Atherosclerosis (ICD10-I70.0). Electronically Signed   By: Zerita Boers M.D.   On: 04/29/2020 19:47    Assessment/Plan: Septic shockresolved-secondary to perforated diverticulitisand peridiverticular abscess. pseudomonas, kleb andESBLe.coli in the culture.has JP drain inserted on 04/14/20.  Onmeropenem since 04/15/20- Given persistent wbc and occas fevers will continue.  Check bcx if febrile again and consider repeat cx of drainage. Discussed CT results with surgery. WIll monitor. No intervention  -  Transaminitis due to shock liver resolved  AKI due to septic shock-getting dialysis intermittently although may not be ESRD  Thrombocytopenia-resolved  HIV- well controlled in jan 2021 her Vl <20 and cd4 >1000. She was getting care at [facility] and saw her provider last in June 2021  HI V_ On Genvoya because of AKI. Cont Dolutegravir + rilpavirine  No PPI while on rilpavirine Thank you very much for the consult. Will follow with you.  Leonel Ramsay   04/30/2020, 7:17 PM

## 2020-04-30 NOTE — Progress Notes (Signed)
OT Cancellation Note  Patient Details Name: SHENE MAXFIELD MRN: [id number] DOB: May 27, 1961   Cancelled Treatment:    Reason Eval/Treat Not Completed: Patient at procedure or test/ unavailable. OT attempt. Pt currently at HD. OT will follow up at next available time.   Darleen Crocker, [place], OTR/L , CBIS ascom [phone]  04/30/20, 12:52 PM  04/30/2020, 12:52 PM

## 2020-05-01 ENCOUNTER — Inpatient Hospital Stay: Admission: RE | Admit: 2020-05-01 | Payer: Medicare Other | Source: Ambulatory Visit

## 2020-05-01 LAB — CBC WITH DIFFERENTIAL/PLATELET
Abs Immature Granulocytes: 0.67 10*3/uL — ABNORMAL HIGH (ref 0.00–0.07)
Basophils Absolute: 0.1 10*3/uL (ref 0.0–0.1)
Basophils Relative: 0 %
Eosinophils Absolute: 0.1 10*3/uL (ref 0.0–0.5)
Eosinophils Relative: 0 %
HCT: 19.3 % — ABNORMAL LOW (ref 36.0–46.0)
Hemoglobin: 6.9 g/dL — ABNORMAL LOW (ref 12.0–15.0)
Immature Granulocytes: 2 %
Lymphocytes Relative: 3 %
Lymphs Abs: 0.7 10*3/uL (ref 0.7–4.0)
MCH: 31.5 pg (ref 26.0–34.0)
MCHC: 35.8 g/dL (ref 30.0–36.0)
MCV: 88.1 fL (ref 80.0–100.0)
Monocytes Absolute: 0.8 10*3/uL (ref 0.1–1.0)
Monocytes Relative: 3 %
Neutro Abs: 25.1 10*3/uL — ABNORMAL HIGH (ref 1.7–7.7)
Neutrophils Relative %: 92 %
Platelets: 151 10*3/uL (ref 150–400)
RBC: 2.19 MIL/uL — ABNORMAL LOW (ref 3.87–5.11)
RDW: 17.6 % — ABNORMAL HIGH (ref 11.5–15.5)
Smear Review: NORMAL
WBC: 27.4 10*3/uL — ABNORMAL HIGH (ref 4.0–10.5)
nRBC: 0.1 % (ref 0.0–0.2)

## 2020-05-01 LAB — HEPATIC FUNCTION PANEL
ALT: 17 U/L (ref 0–44)
AST: 28 U/L (ref 15–41)
Albumin: 1.4 g/dL — ABNORMAL LOW (ref 3.5–5.0)
Alkaline Phosphatase: 97 U/L (ref 38–126)
Bilirubin, Direct: 0.5 mg/dL — ABNORMAL HIGH (ref 0.0–0.2)
Indirect Bilirubin: 0.9 mg/dL (ref 0.3–0.9)
Total Bilirubin: 1.4 mg/dL — ABNORMAL HIGH (ref 0.3–1.2)
Total Protein: 5.3 g/dL — ABNORMAL LOW (ref 6.5–8.1)

## 2020-05-01 LAB — PREPARE RBC (CROSSMATCH)

## 2020-05-01 LAB — BASIC METABOLIC PANEL
Anion gap: 13 (ref 5–15)
BUN: 55 mg/dL — ABNORMAL HIGH (ref 6–20)
CO2: 25 mmol/L (ref 22–32)
Calcium: 7.6 mg/dL — ABNORMAL LOW (ref 8.9–10.3)
Chloride: 96 mmol/L — ABNORMAL LOW (ref 98–111)
Creatinine, Ser: 3.93 mg/dL — ABNORMAL HIGH (ref 0.44–1.00)
GFR calc Af Amer: 14 mL/min — ABNORMAL LOW (ref >60)
GFR calc non Af Amer: 12 mL/min — ABNORMAL LOW (ref >60)
Glucose, Bld: 73 mg/dL (ref 70–99)
Potassium: 4.1 mmol/L (ref 3.5–5.1)
Sodium: 134 mmol/L — ABNORMAL LOW (ref 135–145)

## 2020-05-01 LAB — PHOSPHORUS: Phosphorus: 6.2 mg/dL — ABNORMAL HIGH (ref 2.5–4.6)

## 2020-05-01 LAB — MAGNESIUM: Magnesium: 1.7 mg/dL (ref 1.7–2.4)

## 2020-05-01 MED ORDER — NEPRO/CARBSTEADY PO LIQD
237.0000 mL | Freq: Three times a day (TID) | ORAL | Status: DC
  Administered 2020-05-01 – 2020-05-13 (×10): 237 mL via ORAL

## 2020-05-01 MED ORDER — SODIUM CHLORIDE 0.9% IV SOLUTION: Freq: Once | INTRAVENOUS | Status: AC

## 2020-05-01 MED ORDER — PROSOURCE PLUS PO LIQD
30.0000 mL | Freq: Three times a day (TID) | ORAL | Status: DC
  Administered 2020-05-01 – 2020-05-12 (×16): 30 mL via ORAL
  Filled 2020-05-01 (×38): qty 30

## 2020-05-01 MED ORDER — DIPHENHYDRAMINE HCL 25 MG PO CAPS
25.0000 mg | ORAL_CAPSULE | Freq: Three times a day (TID) | ORAL | Status: DC | PRN
  Administered 2020-05-16 – 2020-05-17 (×2): 25 mg via ORAL
  Filled 2020-05-01: qty 1

## 2020-05-01 NOTE — Consult Note (Signed)
Pharmacy Antibiotic Note  Melanie Bowen is a 59 y.o. female admitted on 04/14/2020 with intra-abdominal infection - decompensating on Zosyn. Source of infection - perforated diverticulitis and peridiverticular abscess Pharmacy has been consulted for Meropenem dosing.  Pt was receiving HD for acute renal failure 8/18-8/19. Cath was pulled due to infection and thrombosis concern. HD restarted 9/1. I/O's show urine output of 400 ml on 9/1 (some return of kidney function).  8/16 wound cx: ABUNDANT KLEBSIELLA PNEUMONIAE  ABUNDANT PSEUDOMONAS AERUGINOSA  MODERATE ESCHERICHIA COLI  MIXED ANAEROBIC FLORA PRESENT.  (Klebsiella, E Coli, and pseudomonas sensitive to imipenem)  ID following  Plan:  - Pt renal function shifting daily.  - Currently receiving meropenem 500mg  q12H. - Monitoring renal function and UOP closely since HD restarted. - Following nephrology notes and plan and expectations to guide dosing.   Height: 5\' 6"  (167.6 cm) Weight: 134.4 kg (296 lb 6.4 oz) (Taken By Lance Sell ) IBW/kg (Calculated) : 59.3  Temp (24hrs), Avg:99.3 F (37.4 C), Min:98.4 F (36.9 C), Max:100.4 F (38 C)  Recent Labs  Lab 04/27/20 0432 04/28/20 0622 04/28/20 0640 04/29/20 0457 04/30/20 0827 05/01/20 0740 05/01/20 0830  WBC 25.3* 33.1*  --  24.2* 29.8*  --  27.4*  CREATININE 3.26*  --  3.86* 4.62* 5.23* 3.93*  --     Estimated Creatinine Clearance: 22 mL/min (A) (by C-G formula based on SCr of 3.93 mg/dL (H)).    Allergies  Allergen Reactions  . Acetaminophen Nausea And Vomiting  . Ibuprofen Other (See Comments)    Reports causes bleeding  . Gabapentin Rash  . Morphine Itching and Rash  . Morphine And Related Itching  . Zanaflex  [Tizanidine] Rash    Antimicrobials this admission: Valacyclovir 8/17 x 1 Ceftriaxone/metronidazole 8/16>8/17 Zosyn 8/16 >>8/17 Levaquin x 1 8/20 Anidulafungin 8/17 >>8/24  Meropenem 8/17 >>  Thank you for allowing pharmacy to be a part of this  patient's care.  Benn Moulder, PharmD Pharmacy Resident  05/01/2020 2:59 PM

## 2020-05-01 NOTE — Progress Notes (Signed)
OT Cancellation Note  Patient Details Name: Melanie Bowen MRN: [id number] DOB: 12/14/1960   Cancelled Treatment:    Reason Eval/Treat Not Completed: Medical issues which prohibited therapy. OT attempt. Hgb of 6.9 in which OT intervention is contraindicated. OT will reattempt when pt is medically able to participate.  Gypsy Decant 05/01/2020, 12:46 PM

## 2020-05-01 NOTE — Progress Notes (Signed)
[facility] INFECTIOUS DISEASE PROGRESS NOTE Date of Admission:  04/14/2020     ID: Melanie Bowen is a 59 y.o. female with sepsis Principal Problem:   Severe sepsis with septic shock ([place]) Active Problems:   HTN (hypertension)   HIV (human immunodeficiency virus infection) ([place])   Diverticulitis of large intestine with abscess without bleeding   Abscess of sigmoid colon due to diverticulitis   Acute renal failure (ARF) ([place])   Hypotension   Diverticulosis   Acute respiratory distress   DNR (do not resuscitate) discussion   Palliative care by specialist   Dyspnea   Subjective: Low grade fever, wbc remains elevated . HGb dropping  ROS  Unable to obtain. Pt confused.   Medications:  Antibiotics Given (last 72 hours)    Date/Time Action Medication Dose Rate   04/28/20 1503 New Bag/Given   meropenem (MERREM) 500 mg in sodium chloride 0.9 % 100 mL IVPB 500 mg 200 mL/hr   04/28/20 1534 Given   rilpivirine (EDURANT) tablet 25 mg 25 mg    04/28/20 2317 New Bag/Given   meropenem (MERREM) 500 mg in sodium chloride 0.9 % 100 mL IVPB 500 mg 200 mL/hr   04/29/20 0918 Given   dolutegravir (TIVICAY) tablet 50 mg 50 mg    04/29/20 0918 Given   rilpivirine (EDURANT) tablet 25 mg 25 mg    04/29/20 0920 New Bag/Given   meropenem (MERREM) 500 mg in sodium chloride 0.9 % 100 mL IVPB 500 mg 200 mL/hr   04/29/20 2307 New Bag/Given   meropenem (MERREM) 500 mg in sodium chloride 0.9 % 100 mL IVPB 500 mg 200 mL/hr   04/30/20 1426 Given   rilpivirine (EDURANT) tablet 25 mg 25 mg    04/30/20 1427 Given   dolutegravir (TIVICAY) tablet 50 mg 50 mg    04/30/20 1445 New Bag/Given   meropenem (MERREM) 500 mg in sodium chloride 0.9 % 100 mL IVPB 500 mg 200 mL/hr   04/30/20 2352 New Bag/Given   meropenem (MERREM) 500 mg in sodium chloride 0.9 % 100 mL IVPB 500 mg 200 mL/hr   05/01/20 0841 Given   dolutegravir (TIVICAY) tablet 50 mg 50 mg    05/01/20 1013 Given   rilpivirine (EDURANT) tablet  25 mg 25 mg    05/01/20 1015 New Bag/Given   meropenem (MERREM) 500 mg in sodium chloride 0.9 % 100 mL IVPB 500 mg 200 mL/hr     . (feeding supplement) PROSource Plus  30 mL Oral TID BM  . acidophilus  1 capsule Oral Daily  . alteplase  2 mg Intracatheter Once  . alteplase  2 mg Intracatheter Once  . amiodarone  200 mg Oral BID  . Chlorhexidine Gluconate Cloth  6 each Topical Daily  . docusate sodium  200 mg Oral BID  . rilpivirine  25 mg Oral Q breakfast   And  . dolutegravir  50 mg Oral Q breakfast  . famotidine  10 mg Oral Q1200  . feeding supplement (NEPRO CARB STEADY)  237 mL Oral TID BM  . guaiFENesin-dextromethorphan  10 mL Oral Q8H  . heparin injection (subcutaneous)  5,000 Units Subcutaneous Q8H  . mometasone-formoterol  2 puff Inhalation BID  . multivitamin  1 tablet Oral QHS  . pregabalin  25 mg Oral Daily  . sodium chloride flush  5 mL Intracatheter Q8H  . sodium zirconium cyclosilicate  10 g Oral Daily    Objective: Vital signs in last 24 hours: Temp:  [98.4 F (36.9 C)-100.4  F (38 C)] 100.4 F (38 C) (09/02 1120) Pulse Rate:  [102-117] 117 (09/02 1120) Resp:  [18-20] 20 (09/02 1120) BP: (98-135)/(53-80) 123/55 (09/02 1120) SpO2:  [90 %-98 %] 97 % (09/02 1120) General: Alert, cooperative, no distress, Lungs: b/l air entry Heart: Regular rate and rhythm, no murmur, rub or gallop. Abdomen: Soft, non-tender,not distended. Drain present left lower quadrant Extremities: atraumatic, no cyanosis. No edema. No clubbing Skin: No rashes or lesions. Or bruising Lymph: Cervical, supraclavicular normal. Neurologic: Grossly non-focal   Lab Results Recent Labs    04/30/20 0827 05/01/20 0740 05/01/20 0830  WBC 29.8*  --  27.4*  HGB 8.4*  --  6.9*  HCT 22.9*  --  19.3*  NA 137 134*  --   K 5.9* 4.1  --   CL 101 96*  --   CO2 15* 25  --   BUN 91* 55*  --   CREATININE 5.23* 3.93*  --     Microbiology: Results for orders placed or performed during the  hospital encounter of 04/14/20  SARS Coronavirus 2 by RT PCR (hospital order, performed in [facility] hospital lab) Nasopharyngeal Nasopharyngeal Swab     Status: None   Collection Time: 04/14/20  2:17 AM   Specimen: Nasopharyngeal Swab  Result Value Ref Range Status   SARS Coronavirus 2 NEGATIVE NEGATIVE Final    Comment: (NOTE) SARS-CoV-2 target nucleic acids are NOT DETECTED.  The SARS-CoV-2 RNA is generally detectable in upper and lower respiratory specimens during the acute phase of infection. The lowest concentration of SARS-CoV-2 viral copies this assay can detect is 250 copies / mL. A negative result does not preclude SARS-CoV-2 infection and should not be used as the sole basis for treatment or other patient management decisions.  A negative result may occur with improper specimen collection / handling, submission of specimen other than nasopharyngeal swab, presence of viral mutation(s) within the areas targeted by this assay, and inadequate number of viral copies (<250 copies / mL). A negative result must be combined with clinical observations, patient history, and epidemiological information.  Fact Sheet for Patients:   StrictlyIdeas.no  Fact Sheet for Healthcare Providers: BankingDealers.co.za  This test is not yet approved or  cleared by the [place] FDA and has been authorized for detection and/or diagnosis of SARS-CoV-2 by FDA under an Emergency Use Authorization (EUA).  This EUA will remain in effect (meaning this test can be used) for the duration of the COVID-19 declaration under Section 564(b)(1) of the Act, 21 U.S.C. section 360bbb-3(b)(1), unless the authorization is terminated or revoked sooner.  Performed at [facility], [address] [place], [place] [address]   CULTURE, BLOOD (ROUTINE X 2) w Reflex to ID Panel     Status: None   Collection Time: 04/14/20  8:14 AM   Specimen: BLOOD   Result Value Ref Range Status   Specimen Description BLOOD LEFT [facility]  Final   Special Requests   Final    BOTTLES DRAWN AEROBIC AND ANAEROBIC Blood Culture adequate volume   Culture   Final    NO GROWTH 5 DAYS Performed at [facility], [address] [place], [place] [address]    Report Status 04/19/2020 FINAL  Final  CULTURE, BLOOD (ROUTINE X 2) w Reflex to ID Panel     Status: None   Collection Time: 04/14/20  8:14 AM   Specimen: BLOOD  Result Value Ref Range Status   Specimen Description BLOOD LEFT WRIST  Final  Special Requests   Final    BOTTLES DRAWN AEROBIC AND ANAEROBIC Blood Culture adequate volume   Culture   Final    NO GROWTH 5 DAYS Performed at [facility], [address] [place], [place] [address]    Report Status 04/19/2020 FINAL  Final  Aerobic/Anaerobic Culture (surgical/deep wound)     Status: None   Collection Time: 04/14/20 11:14 AM   Specimen: Abscess  Result Value Ref Range Status   Specimen Description   Final    ABSCESS Performed at [facility], [address], [place], [place] [address]    Special Requests   Final    NONE Performed at [facility], [place], [place] [address]    Gram Stain   Final    NO WBC SEEN ABUNDANT GRAM NEGATIVE RODS FEW GRAM POSITIVE COCCI FEW GRAM POSITIVE RODS Performed at [facility], [place] [address] [place], [place] [address]    Culture   Final    ABUNDANT KLEBSIELLA PNEUMONIAE ABUNDANT PSEUDOMONAS AERUGINOSA MODERATE ESCHERICHIA COLI Confirmed Extended Spectrum Beta-Lactamase Producer (ESBL).  In bloodstream infections from ESBL organisms, carbapenems are preferred over piperacillin/tazobactam. They are shown to have a lower risk of mortality. MIXED ANAEROBIC FLORA PRESENT.  CALL LAB IF FURTHER IID REQUIRED.    Report Status 04/20/2020 FINAL  Final   Organism ID, Bacteria KLEBSIELLA PNEUMONIAE  Final   Organism ID, Bacteria PSEUDOMONAS AERUGINOSA   Final   Organism ID, Bacteria ESCHERICHIA COLI  Final      Susceptibility   Escherichia coli - MIC*    AMPICILLIN >=32 RESISTANT Resistant     CEFAZOLIN >=64 RESISTANT Resistant     CEFEPIME 16 RESISTANT Resistant     CEFTAZIDIME RESISTANT Resistant     CEFTRIAXONE >=64 RESISTANT Resistant     CIPROFLOXACIN >=4 RESISTANT Resistant     GENTAMICIN <=1 SENSITIVE Sensitive     IMIPENEM <=0.25 SENSITIVE Sensitive     TRIMETH/SULFA >=320 RESISTANT Resistant     AMPICILLIN/SULBACTAM >=32 RESISTANT Resistant     PIP/TAZO 8 SENSITIVE Sensitive     * MODERATE ESCHERICHIA COLI   Klebsiella pneumoniae - MIC*    AMPICILLIN >=32 RESISTANT Resistant     CEFAZOLIN <=4 SENSITIVE Sensitive     CEFEPIME <=0.12 SENSITIVE Sensitive     CEFTAZIDIME <=1 SENSITIVE Sensitive     CEFTRIAXONE <=0.25 SENSITIVE Sensitive     CIPROFLOXACIN <=0.25 SENSITIVE Sensitive     GENTAMICIN <=1 SENSITIVE Sensitive     IMIPENEM <=0.25 SENSITIVE Sensitive     TRIMETH/SULFA <=20 SENSITIVE Sensitive     AMPICILLIN/SULBACTAM 16 INTERMEDIATE Intermediate     PIP/TAZO 8 SENSITIVE Sensitive     * ABUNDANT KLEBSIELLA PNEUMONIAE   Pseudomonas aeruginosa - MIC*    CEFTAZIDIME 4 SENSITIVE Sensitive     CIPROFLOXACIN <=0.25 SENSITIVE Sensitive     GENTAMICIN 2 SENSITIVE Sensitive     IMIPENEM 2 SENSITIVE Sensitive     PIP/TAZO 8 SENSITIVE Sensitive     CEFEPIME 4 SENSITIVE Sensitive     * ABUNDANT PSEUDOMONAS AERUGINOSA  MRSA PCR Screening     Status: None   Collection Time: 04/14/20  6:19 PM   Specimen: Nasopharyngeal  Result Value Ref Range Status   MRSA by PCR NEGATIVE NEGATIVE Final    Comment:        The GeneXpert MRSA Assay (FDA approved for NASAL specimens only), is one component of a comprehensive MRSA colonization surveillance program. It is not intended to  diagnose MRSA infection nor to guide or monitor treatment for MRSA infections. Performed at [facility], [address] [place], [place]  [address]   Urine Culture     Status: None   Collection Time: 04/15/20  4:28 AM   Specimen: Urine, Random  Result Value Ref Range Status   Specimen Description   Final    URINE, RANDOM Performed at [facility], [address] [place], [place] [address]    Special Requests   Final    NONE Performed at [facility], [address] [place], [place] [address]    Culture   Final    NO GROWTH Performed at [facility], [place] [address], [place], [place] [address]    Report Status 04/16/2020 FINAL  Final  CULTURE, BLOOD (ROUTINE X 2) w Reflex to ID Panel     Status: None   Collection Time: 04/22/20  6:12 PM   Specimen: BLOOD  Result Value Ref Range Status   Specimen Description BLOOD FOOT  Final   Special Requests   Final    BOTTLES DRAWN AEROBIC AND ANAEROBIC Blood Culture adequate volume   Culture   Final    NO GROWTH 5 DAYS Performed at [facility], [address] [place], [place] [address]    Report Status 04/27/2020 FINAL  Final  CULTURE, BLOOD (ROUTINE X 2) w Reflex to ID Panel     Status: None   Collection Time: 04/22/20  6:17 PM   Specimen: BLOOD  Result Value Ref Range Status   Specimen Description BLOOD FOOT  Final   Special Requests   Final    BOTTLES DRAWN AEROBIC AND ANAEROBIC Blood Culture results may not be optimal due to an excessive volume of blood received in culture bottles   Culture   Final    NO GROWTH 5 DAYS Performed at [facility], [address], [place],  [address]    Report Status 04/27/2020 FINAL  Final    Studies/Results: CT ABDOMEN PELVIS WO CONTRAST  Result Date: 04/29/2020 CLINICAL DATA:  Left lower quadrant abdominal pain. History of diverticulitis complicated by abscess. EXAM: CT ABDOMEN AND PELVIS WITHOUT CONTRAST TECHNIQUE: Multidetector CT imaging of the abdomen and pelvis was performed following the standard protocol without IV contrast. COMPARISON:  CT abdomen pelvis dated 04/18/2020.  FINDINGS: Lower chest: There is a moderate right pleural effusion with associated atelectasis. There is mild left basilar atelectasis. Hepatobiliary: No focal liver abnormality is seen. No gallstones, gallbladder wall thickening, or biliary dilatation. Pancreas: Unremarkable. No pancreatic ductal dilatation or surrounding inflammatory changes. Spleen: Normal in size without focal abnormality. Adrenals/Urinary Tract: Adrenal glands are unremarkable. Kidneys are normal, without renal calculi, focal lesion, or hydronephrosis. Bladder is unremarkable. Stomach/Bowel: Stomach is within normal limits. Enteric contrast reaches the rectum. Multiple mildly dilated loops of small bowel are seen in the mid abdomen without a transition point to decompressed distal bowel, similar to prior exam. Appendix appears normal. There is sigmoid diverticulosis without significant associated inflammatory changes. Vascular/Lymphatic: Aortic atherosclerosis. No enlarged abdominal or pelvic lymph nodes. Reproductive: Uterus and bilateral adnexa are unremarkable. Other: There is a moderate volume intraperitoneal fluid, increased since 04/18/2020. No definite well-defined fluid collection to suggest abscess is identified, however evaluation is limited due to the lack of intravenous contrast. A locule of gas in the lower midline abdomen (series 2, image 67) is favored to be located within a loop of bowel. A pigtail catheter is seen in the lower  abdomen, slightly retracted since prior exam. Anasarca is noted. No abdominal wall hernia. Musculoskeletal: Spinal fusion hardware is seen from L3-L5. Degenerative changes are seen in the spine. IMPRESSION: 1. Moderate volume intraperitoneal fluid, increased since 04/18/2020. No definite well-defined fluid collection to suggest abscess is identified, however evaluation is limited due to the lack of intravenous contrast. A pigtail catheter has been partially retracted but remains located in the lower  abdomen. 2. Multiple mildly dilated loops of small bowel in the mid abdomen are similar to prior exam and likely reflect ileus. 3. Moderate right pleural effusion with associated atelectasis. Aortic Atherosclerosis (ICD10-I70.0). Electronically Signed   By: Zerita Boers M.D.   On: 04/29/2020 19:47    Assessment/Plan: Septic shock- resolved.  secondary to perforated diverticulitisand peridiverticular abscess. pseudomonas, kleb andESBLe.coli in the culture.has JP drain inserted on 04/14/20.  Onmeropenem since 04/15/20 She has persistent elevated  wbc and occas fevers. Diff includes un-drained infection in abd with infected peritoneal fluid, new MDRO organism (IE VRE)  or untreated infection (fungal), line infection. Discussed CT results 9/1 with surgery. They suggest monitor. No intervention per surgery.   However if still with fevers, wbc would discuss with surgery if paracentesis can be done. Could reculture fluid from drain as still quite purulent but not sure of the benefit.    AKI due to septic shock-getting dialysis intermittently although may not be ESRD     HIV- well controlled in jan 2021 her Vl <20 and cd4 >1000. She was getting care at [facility] and saw her provider last in June 2021  HI V_ On Genvoya because of AKI. Cont Dolutegravir + rilpavirine  No PPI while on rilpavirine Thank you very much for the consult. Will follow with you.  Leonel Ramsay   05/01/2020, 2:25 PM

## 2020-05-01 NOTE — Progress Notes (Addendum)
Progress Note    Melanie Bowen  IOE:[id number] DOB: February 02, 1961  DOA: 04/14/2020 PCP: Theotis Burrow, MD      Brief Narrative:    Medical records reviewed and are as summarized below:  Melanie Bowen is a 59 y.o. female       Assessment/Plan:   Principal Problem:   Severe sepsis with septic shock ([place]) Active Problems:   HTN (hypertension)   HIV (human immunodeficiency virus infection) ([place])   Diverticulitis of large intestine with abscess without bleeding   Abscess of sigmoid colon due to diverticulitis   Acute renal failure (ARF) ([place])   Hypotension   Diverticulosis   Acute respiratory distress   DNR (do not resuscitate) discussion   Palliative care by specialist   Dyspnea    Nutrition Problem: Increased nutrient needs Etiology: chronic illness (COPD, new HD)  Signs/Symptoms: estimated needs   Body mass index is 47.84 kg/m.  (Morbid obesity): This complicates overall care and prognosis   Septic shock - resolved  Acute sigmoid diverticulitis and diverticular abscess, leukocytosis- s/p CT-guided drainage by IR on 04/14/2020  Gallbladder abscess-fluid culture showed Klebsiella pneumonia, Pseudomonas aeruginosa and E. coli  Acute kidney injury complicated by hyperkalemia and metabolic acidosis  Atrial fibrillation with RVR-converted to normal sinus rhythm  Acute anemia, likely multifactorial poor oral intake and acute illness (no evidence of bleeding thus far)  COPD exacerbation-resolved  HIV infection  Acute liver failure-resolved  Acute hypoxemic respiratory failure-resolved  Metabolic encephalopathy-resolved  PLAN  Transfuse 1 unit of packed red blood cells for severe anemia.  Risk, benefits and alternatives to blood transfusion were discussed with the patient and she is agreeable to proceed with blood transfusion. Continue IV meropenem Follow-up with ID and general surgeon Follow-up with nephrologist for  hemodialysis Continue antiretroviral therapy Continue bronchodilators Continue amiodarone.  Consideration for long-term anticoagulation in the future   Diet Order            Diet renal with fluid restriction Room service appropriate? Yes; Fluid consistency: Thin  Diet effective now                    Consultants:  General surgeon  Cardiologist  Nephrologist  Vascular surgeon  Procedures:  CT-guided drainage of diverticular abscess on 04/14/2020    Medications:   . sodium chloride   Intravenous Once  . acidophilus  1 capsule Oral Daily  . alteplase  2 mg Intracatheter Once  . alteplase  2 mg Intracatheter Once  . amiodarone  200 mg Oral BID  . Chlorhexidine Gluconate Cloth  6 each Topical Daily  . docusate sodium  200 mg Oral BID  . rilpivirine  25 mg Oral Q breakfast   And  . dolutegravir  50 mg Oral Q breakfast  . famotidine  10 mg Oral Q1200  . feeding supplement (NEPRO CARB STEADY)  237 mL Oral BID BM  . guaiFENesin-dextromethorphan  10 mL Oral Q8H  . heparin injection (subcutaneous)  5,000 Units Subcutaneous Q8H  . mometasone-formoterol  2 puff Inhalation BID  . multivitamin  1 tablet Oral QHS  . pregabalin  25 mg Oral Daily  . sodium chloride flush  5 mL Intracatheter Q8H  . sodium zirconium cyclosilicate  10 g Oral Daily   Continuous Infusions: . sodium chloride Stopped (05/01/20 0900)  . meropenem (MERREM) IV 500 mg (05/01/20 1015)     Anti-infectives (From admission, onward)   Start     Dose/Rate Route Frequency  Ordered Stop   04/30/20 0800  rilpivirine (EDURANT) tablet 25 mg       "And" Linked Group Details   25 mg Oral Daily with breakfast 04/29/20 1254     04/30/20 0800  dolutegravir (TIVICAY) tablet 50 mg       "And" Linked Group Details   50 mg Oral Daily with breakfast 04/29/20 1254     04/28/20 2200  meropenem (MERREM) 500 mg in sodium chloride 0.9 % 100 mL IVPB        500 mg 200 mL/hr over 30 Minutes Intravenous Every 12 hours  04/28/20 1546     04/26/20 2200  meropenem (MERREM) 500 mg in sodium chloride 0.9 % 100 mL IVPB  Status:  Discontinued        500 mg 200 mL/hr over 30 Minutes Intravenous Every 8 hours 04/26/20 1258 04/28/20 1546   04/25/20 1700  rilpivirine (EDURANT) tablet 25 mg  Status:  Discontinued        25 mg Oral Daily with breakfast 04/24/20 1636 04/25/20 1111   04/25/20 1200  rilpivirine (EDURANT) tablet 25 mg  Status:  Discontinued        25 mg Oral Daily with breakfast 04/25/20 1111 04/29/20 1254   04/25/20 1000  meropenem (MERREM) 500 mg in sodium chloride 0.9 % 100 mL IVPB  Status:  Discontinued        500 mg 200 mL/hr over 30 Minutes Intravenous Every 12 hours 04/25/20 0935 04/26/20 1258   04/24/20 1700  darunavir-cobicistat (PREZCOBIX) 800-150 MG per tablet 1 tablet  Status:  Discontinued        1 tablet Oral Daily with breakfast 04/24/20 1231 04/24/20 1636   04/24/20 1330  dolutegravir (TIVICAY) tablet 50 mg  Status:  Discontinued        50 mg Oral Daily 04/24/20 1231 04/29/20 1254   04/23/20 2200  meropenem (MERREM) 500 mg in sodium chloride 0.9 % 100 mL IVPB  Status:  Discontinued        500 mg 200 mL/hr over 30 Minutes Intravenous Every 24 hours 04/23/20 1127 04/25/20 0935   04/18/20 2000  levofloxacin (LEVAQUIN) IVPB 750 mg        750 mg 100 mL/hr over 90 Minutes Intravenous  Once 04/18/20 1857 04/19/20 0004   04/18/20 1800  levofloxacin (LEVAQUIN) IVPB 750 mg  Status:  Discontinued        750 mg 100 mL/hr over 90 Minutes Intravenous  Once 04/18/20 1640 04/18/20 1857   04/16/20 2200  anidulafungin (ERAXIS) 100 mg in sodium chloride 0.9 % 100 mL IVPB        100 mg 78 mL/hr over 100 Minutes Intravenous Every 24 hours 04/16/20 1707 04/22/20 0211   04/15/20 2200  valACYclovir (VALTREX) tablet 500 mg  Status:  Discontinued        500 mg Oral Daily at bedtime 04/15/20 1248 04/16/20 1259   04/15/20 2200  anidulafungin (ERAXIS) 200 mg in sodium chloride 0.9 % 200 mL IVPB        200 mg 78  mL/hr over 200 Minutes Intravenous  Once 04/15/20 2008 04/16/20 0327   04/15/20 2200  meropenem (MERREM) 1 g in sodium chloride 0.9 % 100 mL IVPB  Status:  Discontinued        1 g 200 mL/hr over 30 Minutes Intravenous Every 12 hours 04/15/20 2017 04/23/20 1127   04/15/20 1400  piperacillin-tazobactam (ZOSYN) IVPB 3.375 g  Status:  Discontinued        3.375  g 12.5 mL/hr over 240 Minutes Intravenous Every 8 hours 04/15/20 1300 04/15/20 2006   04/14/20 2200  elvitegravir-cobicistat-emtricitabine-tenofovir (GENVOYA) 150-150-200-10 MG tablet 1 tablet  Status:  Discontinued        1 tablet Oral Daily at bedtime 04/14/20 0235 04/15/20 0957   04/14/20 2200  valACYclovir (VALTREX) tablet 1,000 mg  Status:  Discontinued        1,000 mg Oral Daily at bedtime 04/14/20 0235 04/15/20 1248   04/14/20 1230  cefTRIAXone (ROCEPHIN) 2 g in sodium chloride 0.9 % 100 mL IVPB  Status:  Discontinued        2 g 200 mL/hr over 30 Minutes Intravenous Every 24 hours 04/14/20 1223 04/15/20 1300   04/14/20 1230  metroNIDAZOLE (FLAGYL) IVPB 500 mg  Status:  Discontinued        500 mg 100 mL/hr over 60 Minutes Intravenous Every 8 hours 04/14/20 1223 04/15/20 1636   04/14/20 1030  piperacillin-tazobactam (ZOSYN) IVPB 3.375 g  Status:  Discontinued        3.375 g 12.5 mL/hr over 240 Minutes Intravenous Every 8 hours 04/14/20 0235 04/14/20 1223   04/14/20 0215  piperacillin-tazobactam (ZOSYN) IVPB 4.5 g  Status:  Discontinued        4.5 g 200 mL/hr over 30 Minutes Intravenous  Once 04/14/20 0200 04/14/20 0208   04/14/20 0215  piperacillin-tazobactam (ZOSYN) IVPB 3.375 g        3.375 g 100 mL/hr over 30 Minutes Intravenous  Once 04/14/20 0208 04/14/20 0301             Family Communication/Anticipated D/C date and plan/Code Status   DVT prophylaxis: heparin injection 5,000 Units Start: 04/23/20 0800 because of high risk for DVT     Code Status: Partial Code  Family Communication: Plan discussed with patient.   I called her daughter [name], at 12:31 PM but there was no response and her voice message box was full.  I was able to speak to her daughter at the bedside at 5:48 pm. Patient's sister was on speaker phone as well  Disposition Plan:    Status is: Inpatient  Remains inpatient appropriate because:Inpatient level of care appropriate due to severity of illness and Requiring hemodialysis for acute kidney injury   Dispo: The patient is from: Home              Anticipated d/c is to: SNF              Anticipated d/c date is: 3 days              Patient currently is not medically stable to d/c.           Subjective:   C/o generalized itching.  No cough, chest pain or shortness of breath.  Objective:    Vitals:   04/30/20 2345 05/01/20 0517 05/01/20 0749 05/01/20 1120  BP: 135/80 129/68 (!) 98/53 (!) 123/55  Pulse:  (!) 115 (!) 102 (!) 117  Resp:  19 19 20   Temp:  99 F (37.2 C) 99 F (37.2 C) (!) 100.4 F (38 C)  TempSrc:  Oral Oral Oral  SpO2:  90% 98% 97%  Weight:      Height:       No data found.   Intake/Output Summary (Last 24 hours) at 05/01/2020 1243 Last data filed at 05/01/2020 0600 Gross per 24 hour  Intake 660 ml  Output 84 ml  Net 576 ml   Filed Weights   04/25/20 0508  04/26/20 0458 04/29/20 0500  Weight: 133.1 kg 131.3 kg 134.4 kg    Exam:  GEN: No acute distress SKIN: Warm and dry EYES: Pale but anicteric ENT: MMM CV: Tachycardic, no murmurs heard PULM: Mild bilateral wheezing ABD: Soft, obese, nontender +BS CNS: Alert and oriented, no focal deficits EXT:  bilateral leg edema (3+), no tenderness   Data Reviewed:   I have personally reviewed following labs and imaging studies:  Labs: Labs show the following:   Basic Metabolic Panel: Recent Labs  Lab 04/27/20 0432 04/27/20 0432 04/28/20 0640 04/28/20 0640 04/29/20 0457 04/29/20 0457 04/30/20 0827 05/01/20 0740  NA 133*  --  134*  --  134*  --  137 134*  K 4.5   < > 4.5   < >  4.6   < > 5.9* 4.1  CL 99  --  98  --  98  --  101 96*  CO2 21*  --  24  --  24  --  15* 25  GLUCOSE 94  --  107*  --  78  --  64* 73  BUN 71*  --  77*  --  84*  --  91* 55*  CREATININE 3.26*  --  3.86*  --  4.62*  --  5.23* 3.93*  CALCIUM 7.4*  --  7.6*  --  7.6*  --  7.3* 7.6*  MG 2.1  --  1.7  --  1.8  --  1.7 1.7  PHOS 6.3*  --  6.1*  --  6.9*  --  8.5* 6.2*   < > = values in this interval not displayed.   GFR Estimated Creatinine Clearance: 22 mL/min (A) (by C-G formula based on SCr of 3.93 mg/dL (H)). Liver Function Tests: Recent Labs  Lab 04/25/20 0613  AST 36  ALT 44  ALKPHOS 100  BILITOT 1.2  PROT 4.9*  ALBUMIN 1.9*   No results for input(s): LIPASE, AMYLASE in the last 168 hours. No results for input(s): AMMONIA in the last 168 hours. Coagulation profile No results for input(s): INR, PROTIME in the last 168 hours.  CBC: Recent Labs  Lab 04/27/20 0432 04/28/20 0622 04/29/20 0457 04/30/20 0827 05/01/20 0830  WBC 25.3* 33.1* 24.2* 29.8* 27.4*  NEUTROABS  --   --   --   --  25.1*  HGB 8.9* 9.0* 8.3* 8.4* 6.9*  HCT 27.2* 27.2* 24.4* 22.9* 19.3*  MCV 94.4 92.5 91.0 86.7 88.1  PLT 172 193 165 135* 151   Cardiac Enzymes: No results for input(s): CKTOTAL, CKMB, CKMBINDEX, TROPONINI in the last 168 hours. BNP (last 3 results) No results for input(s): PROBNP in the last 8760 hours. CBG: No results for input(s): GLUCAP in the last 168 hours. D-Dimer: No results for input(s): DDIMER in the last 72 hours. Hgb A1c: No results for input(s): HGBA1C in the last 72 hours. Lipid Profile: No results for input(s): CHOL, HDL, LDLCALC, TRIG, CHOLHDL, LDLDIRECT in the last 72 hours. Thyroid function studies: No results for input(s): TSH, T4TOTAL, T3FREE, THYROIDAB in the last 72 hours.  Invalid input(s): FREET3 Anemia work up: No results for input(s): VITAMINB12, FOLATE, FERRITIN, TIBC, IRON, RETICCTPCT in the last 72 hours. Sepsis Labs: Recent Labs  Lab  04/28/20 0622 04/29/20 0457 04/30/20 0827 05/01/20 0830  WBC 33.1* 24.2* 29.8* 27.4*    Microbiology Recent Results (from the past 240 hour(s))  CULTURE, BLOOD (ROUTINE X 2) w Reflex to ID Panel     Status: None  Collection Time: 04/22/20  6:12 PM   Specimen: BLOOD  Result Value Ref Range Status   Specimen Description BLOOD FOOT  Final   Special Requests   Final    BOTTLES DRAWN AEROBIC AND ANAEROBIC Blood Culture adequate volume   Culture   Final    NO GROWTH 5 DAYS Performed at [facility], [address] [place], [place] [address]    Report Status 04/27/2020 FINAL  Final  CULTURE, BLOOD (ROUTINE X 2) w Reflex to ID Panel     Status: None   Collection Time: 04/22/20  6:17 PM   Specimen: BLOOD  Result Value Ref Range Status   Specimen Description BLOOD FOOT  Final   Special Requests   Final    BOTTLES DRAWN AEROBIC AND ANAEROBIC Blood Culture results may not be optimal due to an excessive volume of blood received in culture bottles   Culture   Final    NO GROWTH 5 DAYS Performed at [facility], [address], [place],  [address]    Report Status 04/27/2020 FINAL  Final    Procedures and diagnostic studies:  CT ABDOMEN PELVIS WO CONTRAST  Result Date: 04/29/2020 CLINICAL DATA:  Left lower quadrant abdominal pain. History of diverticulitis complicated by abscess. EXAM: CT ABDOMEN AND PELVIS WITHOUT CONTRAST TECHNIQUE: Multidetector CT imaging of the abdomen and pelvis was performed following the standard protocol without IV contrast. COMPARISON:  CT abdomen pelvis dated 04/18/2020. FINDINGS: Lower chest: There is a moderate right pleural effusion with associated atelectasis. There is mild left basilar atelectasis. Hepatobiliary: No focal liver abnormality is seen. No gallstones, gallbladder wall thickening, or biliary dilatation. Pancreas: Unremarkable. No pancreatic ductal dilatation or surrounding inflammatory changes. Spleen: Normal in size  without focal abnormality. Adrenals/Urinary Tract: Adrenal glands are unremarkable. Kidneys are normal, without renal calculi, focal lesion, or hydronephrosis. Bladder is unremarkable. Stomach/Bowel: Stomach is within normal limits. Enteric contrast reaches the rectum. Multiple mildly dilated loops of small bowel are seen in the mid abdomen without a transition point to decompressed distal bowel, similar to prior exam. Appendix appears normal. There is sigmoid diverticulosis without significant associated inflammatory changes. Vascular/Lymphatic: Aortic atherosclerosis. No enlarged abdominal or pelvic lymph nodes. Reproductive: Uterus and bilateral adnexa are unremarkable. Other: There is a moderate volume intraperitoneal fluid, increased since 04/18/2020. No definite well-defined fluid collection to suggest abscess is identified, however evaluation is limited due to the lack of intravenous contrast. A locule of gas in the lower midline abdomen (series 2, image 67) is favored to be located within a loop of bowel. A pigtail catheter is seen in the lower abdomen, slightly retracted since prior exam. Anasarca is noted. No abdominal wall hernia. Musculoskeletal: Spinal fusion hardware is seen from L3-L5. Degenerative changes are seen in the spine. IMPRESSION: 1. Moderate volume intraperitoneal fluid, increased since 04/18/2020. No definite well-defined fluid collection to suggest abscess is identified, however evaluation is limited due to the lack of intravenous contrast. A pigtail catheter has been partially retracted but remains located in the lower abdomen. 2. Multiple mildly dilated loops of small bowel in the mid abdomen are similar to prior exam and likely reflect ileus. 3. Moderate right pleural effusion with associated atelectasis. Aortic Atherosclerosis (ICD10-I70.0). Electronically Signed   By: Zerita Boers M.D.   On: 04/29/2020 19:47               LOS: 17 days   [facility]   Pager on [email or web address]. If 7PM-7AM, please  contact night-coverage at [email or web address]     05/01/2020, 12:43 PM

## 2020-05-01 NOTE — Progress Notes (Signed)
PT Cancellation Note  Patient Details Name: Melanie Bowen MRN: [id number] DOB: [birth year]   Cancelled Treatment:     PT attempt. PT hold. Pt HgB 6.9. Will hold PT today per acute PT protocols. Will return when pt more appropriate to participate.    Willette Pa 05/01/2020, 12:35 PM

## 2020-05-01 NOTE — Progress Notes (Signed)
Nutrition Follow-up  DOCUMENTATION CODES:   Morbid obesity  INTERVENTION:  Increase Nepro Shake po TID, each supplement provides 425 kcal and 19 grams protein  ProSource Plus 30 ml po TID, each supplement provides 100 kcal and 15 grams of protein  Continue Rena-vit daily  Monitor for nutrition support needs  NUTRITION DIAGNOSIS:   Increased nutrient needs related to chronic illness (COPD, new HD) as evidenced by estimated needs. -ongoing  GOAL:   Patient will meet greater than or equal to 90% of their needs -unmet; progressing  MONITOR:   PO intake, Supplement acceptance, Labs, Weight trends, Skin, I & O's  REASON FOR ASSESSMENT:   LOS    ASSESSMENT:  RD working remotely.  59 y/o female with h/o HIV, COPD and HTN who is admitted with septic shock secondary to perforated diverticulitis and peridiverticular abscess s/p JP drain on 04/14/20 and AKI requiring temporary HD  8/27 - RIJ permacath placed  Hypotension during HD treatment yesterday UF 0 UOP 84 ml  RD attempted to reach pt via phone, line busy x 2. RD called nurses station to speak with RN who was unavailable at secondary to in with another pt. Will try to speak with RN at a later time as the day permits. Patient continues to have very poor oral intake during admission; eating 0-15% of the last 3 documented intakes. RD ordered Nepro Shakes BID on 8/26; consuming 82%. Will increase to TID and add ProSource Plus BID to aid with meeting needs. Pt has been without adequate nutrition during admission due to poor po intake, she is at increased risk for malnutrition, if appropriate recommend consideration of nutrition support to help pt meet needs.   Weights have remained fairly stable over the last week, up ~28 lbs since admission. Medications reviewed and include: Acidophilus, Pacerone, Colace, Edurant, Tivicay, Pepcid, Rena-vit, Lyrica, Lokelma IVPB merrem  Labs: Na 134, BUN 55 (H), Cr 3.93 (H), P 6.2 (H), K 4.1  (WNL) trended down from 5.9, WBC 27.4 (H), Hgb 6.9 (L), HCT 19.3 (L)  Diet Order:   Diet Order            Diet renal with fluid restriction Room service appropriate? Yes; Fluid consistency: Thin  Diet effective now                 EDUCATION NEEDS:   No education needs have been identified at this time  Skin:  Skin Assessment: Reviewed RN Assessment (ecchymosis, MASD, incision abdomen)  Last BM:  8/25- TYPE 3  Height:   Ht Readings from Last 1 Encounters:  04/22/20 5\' 6"  (1.676 m)    Weight:   Wt Readings from Last 1 Encounters:  04/29/20 134.4 kg    Ideal Body Weight:  59 kg  BMI:  Body mass index is 47.84 kg/m.  Estimated Nutritional Needs:   Kcal:  2300-2600kcal/day  Protein:  >120g/day  Fluid:  UOP +1L   Lajuan Lines, RD, LDN Clinical Nutrition After Hours/Weekend Pager # in [place]

## 2020-05-01 NOTE — Progress Notes (Signed)
[facility]  ROUNDING NOTE   Subjective:   Hemodialysis treatment yesterday. Had some hypotension during treatment. UF of 0. UOP 69mL.   Hemoglobin 6.9  Afebrile last 24 hours  Objective:  Vital signs in last 24 hours:  Temp:  [98.4 F (36.9 C)-99.9 F (37.7 C)] 99 F (37.2 C) (09/02 0749) Pulse Rate:  [102-118] 102 (09/02 0749) Resp:  [18-21] 19 (09/02 0749) BP: (98-135)/(53-80) 98/53 (09/02 0749) SpO2:  [90 %-98 %] 98 % (09/02 0749)  Weight change:  Filed Weights   04/25/20 0508 04/26/20 0458 04/29/20 0500  Weight: 133.1 kg 131.3 kg 134.4 kg    Intake/Output: I/O last 3 completed shifts: In: 19 [P.O.:360; I.V.:120; IV Piggyback:300] Out: 494 [Urine:484; Drains:10]   Intake/Output this shift:  No intake/output data recorded.  Physical Exam: General: Resting in bed, in no acute distress  Head: Oal mucosal membranes  Eyes: Anicteric  Neck: Supple, trachea at the midline  Lungs:  diminished at the bases  Heart: Regular rate and rhythm  Abdomen:  Obese, nontender,   Extremities:  1+ peripheral edema.  Neurologic: Alert, oriented, answers simple questions appropriately  Skin: No acute rashes or  lesions  Access: RIJ permcath 8/27 Dr. [name]    Basic Metabolic Panel: Recent Labs  Lab 04/27/20 0569 04/27/20 7948 04/28/20 0165 04/28/20 0640 04/29/20 0457 04/30/20 0827 05/01/20 0740  NA 133*  --  134*  --  134* 137 134*  K 4.5  --  4.5  --  4.6 5.9* 4.1  CL 99  --  98  --  98 101 96*  CO2 21*  --  24  --  24 15* 25  GLUCOSE 94  --  107*  --  78 64* 73  BUN 71*  --  77*  --  84* 91* 55*  CREATININE 3.26*  --  3.86*  --  4.62* 5.23* 3.93*  CALCIUM 7.4*   < > 7.6*   < > 7.6* 7.3* 7.6*  MG 2.1  --  1.7  --  1.8 1.7 1.7  PHOS 6.3*  --  6.1*  --  6.9* 8.5* 6.2*   < > = values in this interval not displayed.    Liver Function Tests: Recent Labs  Lab 04/25/20 0613  AST 36  ALT 44  ALKPHOS 100  BILITOT 1.2  PROT 4.9*  ALBUMIN 1.9*   No  results for input(s): LIPASE, AMYLASE in the last 168 hours. No results for input(s): AMMONIA in the last 168 hours.  CBC: Recent Labs  Lab 04/27/20 0432 04/28/20 0622 04/29/20 0457 04/30/20 0827 05/01/20 0830  WBC 25.3* 33.1* 24.2* 29.8* 27.4*  NEUTROABS  --   --   --   --  25.1*  HGB 8.9* 9.0* 8.3* 8.4* 6.9*  HCT 27.2* 27.2* 24.4* 22.9* 19.3*  MCV 94.4 92.5 91.0 86.7 88.1  PLT 172 193 165 135* 151    Cardiac Enzymes: No results for input(s): CKTOTAL, CKMB, CKMBINDEX, TROPONINI in the last 168 hours.  BNP: Invalid input(s): POCBNP  CBG: No results for input(s): GLUCAP in the last 168 hours.  Microbiology: Results for orders placed or performed during the hospital encounter of 04/14/20  SARS Coronavirus 2 by RT PCR (hospital order, performed in [facility] hospital lab) Nasopharyngeal Nasopharyngeal Swab     Status: None   Collection Time: 04/14/20  2:17 AM   Specimen: Nasopharyngeal Swab  Result Value Ref Range Status   SARS Coronavirus 2 NEGATIVE NEGATIVE Final  Comment: (NOTE) SARS-CoV-2 target nucleic acids are NOT DETECTED.  The SARS-CoV-2 RNA is generally detectable in upper and lower respiratory specimens during the acute phase of infection. The lowest concentration of SARS-CoV-2 viral copies this assay can detect is 250 copies / mL. A negative result does not preclude SARS-CoV-2 infection and should not be used as the sole basis for treatment or other patient management decisions.  A negative result may occur with improper specimen collection / handling, submission of specimen other than nasopharyngeal swab, presence of viral mutation(s) within the areas targeted by this assay, and inadequate number of viral copies (<250 copies / mL). A negative result must be combined with clinical observations, patient history, and epidemiological information.  Fact Sheet for Patients:   StrictlyIdeas.no  Fact Sheet for Healthcare  Providers: BankingDealers.co.za  This test is not yet approved or  cleared by the [place] FDA and has been authorized for detection and/or diagnosis of SARS-CoV-2 by FDA under an Emergency Use Authorization (EUA).  This EUA will remain in effect (meaning this test can be used) for the duration of the COVID-19 declaration under Section 564(b)(1) of the Act, 21 U.S.C. section 360bbb-3(b)(1), unless the authorization is terminated or revoked sooner.  Performed at [facility], [address] [place], [place] [address]   CULTURE, BLOOD (ROUTINE X 2) w Reflex to ID Panel     Status: None   Collection Time: 04/14/20  8:14 AM   Specimen: BLOOD  Result Value Ref Range Status   Specimen Description BLOOD LEFT [facility]  Final   Special Requests   Final    BOTTLES DRAWN AEROBIC AND ANAEROBIC Blood Culture adequate volume   Culture   Final    NO GROWTH 5 DAYS Performed at [facility], [address] [place], [place] [address]    Report Status 04/19/2020 FINAL  Final  CULTURE, BLOOD (ROUTINE X 2) w Reflex to ID Panel     Status: None   Collection Time: 04/14/20  8:14 AM   Specimen: BLOOD  Result Value Ref Range Status   Specimen Description BLOOD LEFT WRIST  Final   Special Requests   Final    BOTTLES DRAWN AEROBIC AND ANAEROBIC Blood Culture adequate volume   Culture   Final    NO GROWTH 5 DAYS Performed at [facility], [address], [place], [place] [address]    Report Status 04/19/2020 FINAL  Final  Aerobic/Anaerobic Culture (surgical/deep wound)     Status: None   Collection Time: 04/14/20 11:14 AM   Specimen: Abscess  Result Value Ref Range Status   Specimen Description   Final    ABSCESS Performed at [facility], [address], [place], [place] [address]    Special Requests   Final    NONE Performed at [facility], [place], [place] [address]    Gram Stain   Final    NO WBC  SEEN ABUNDANT GRAM NEGATIVE RODS FEW GRAM POSITIVE COCCI FEW GRAM POSITIVE RODS Performed at [facility], [place] [address] [place],  [address]    Culture   Final    ABUNDANT KLEBSIELLA PNEUMONIAE ABUNDANT PSEUDOMONAS AERUGINOSA MODERATE ESCHERICHIA COLI Confirmed Extended Spectrum Beta-Lactamase Producer (ESBL).  In bloodstream infections from ESBL organisms, carbapenems are preferred over piperacillin/tazobactam. They are shown to have a lower risk of mortality. MIXED ANAEROBIC FLORA PRESENT.  CALL LAB IF FURTHER IID REQUIRED.    Report Status 04/20/2020 FINAL  Final  Organism ID, Bacteria KLEBSIELLA PNEUMONIAE  Final   Organism ID, Bacteria PSEUDOMONAS AERUGINOSA  Final   Organism ID, Bacteria ESCHERICHIA COLI  Final      Susceptibility   Escherichia coli - MIC*    AMPICILLIN >=32 RESISTANT Resistant     CEFAZOLIN >=64 RESISTANT Resistant     CEFEPIME 16 RESISTANT Resistant     CEFTAZIDIME RESISTANT Resistant     CEFTRIAXONE >=64 RESISTANT Resistant     CIPROFLOXACIN >=4 RESISTANT Resistant     GENTAMICIN <=1 SENSITIVE Sensitive     IMIPENEM <=0.25 SENSITIVE Sensitive     TRIMETH/SULFA >=320 RESISTANT Resistant     AMPICILLIN/SULBACTAM >=32 RESISTANT Resistant     PIP/TAZO 8 SENSITIVE Sensitive     * MODERATE ESCHERICHIA COLI   Klebsiella pneumoniae - MIC*    AMPICILLIN >=32 RESISTANT Resistant     CEFAZOLIN <=4 SENSITIVE Sensitive     CEFEPIME <=0.12 SENSITIVE Sensitive     CEFTAZIDIME <=1 SENSITIVE Sensitive     CEFTRIAXONE <=0.25 SENSITIVE Sensitive     CIPROFLOXACIN <=0.25 SENSITIVE Sensitive     GENTAMICIN <=1 SENSITIVE Sensitive     IMIPENEM <=0.25 SENSITIVE Sensitive     TRIMETH/SULFA <=20 SENSITIVE Sensitive     AMPICILLIN/SULBACTAM 16 INTERMEDIATE Intermediate     PIP/TAZO 8 SENSITIVE Sensitive     * ABUNDANT KLEBSIELLA PNEUMONIAE   Pseudomonas aeruginosa - MIC*    CEFTAZIDIME 4 SENSITIVE Sensitive     CIPROFLOXACIN <=0.25 SENSITIVE Sensitive      GENTAMICIN 2 SENSITIVE Sensitive     IMIPENEM 2 SENSITIVE Sensitive     PIP/TAZO 8 SENSITIVE Sensitive     CEFEPIME 4 SENSITIVE Sensitive     * ABUNDANT PSEUDOMONAS AERUGINOSA  MRSA PCR Screening     Status: None   Collection Time: 04/14/20  6:19 PM   Specimen: Nasopharyngeal  Result Value Ref Range Status   MRSA by PCR NEGATIVE NEGATIVE Final    Comment:        The GeneXpert MRSA Assay (FDA approved for NASAL specimens only), is one component of a comprehensive MRSA colonization surveillance program. It is not intended to diagnose MRSA infection nor to guide or monitor treatment for MRSA infections. Performed at [facility], [address], [place], [place] [address]   Urine Culture     Status: None   Collection Time: 04/15/20  4:28 AM   Specimen: Urine, Random  Result Value Ref Range Status   Specimen Description   Final    URINE, RANDOM Performed at [facility], [address], [place], [place] [address]    Special Requests   Final    NONE Performed at [facility], [address] [place], [place] [address]    Culture   Final    NO GROWTH Performed at [facility], [place] [address], [place], [place] [address]    Report Status 04/16/2020 FINAL  Final  CULTURE, BLOOD (ROUTINE X 2) w Reflex to ID Panel     Status: None   Collection Time: 04/22/20  6:12 PM   Specimen: BLOOD  Result Value Ref Range Status   Specimen Description BLOOD FOOT  Final   Special Requests   Final    BOTTLES DRAWN AEROBIC AND ANAEROBIC Blood Culture adequate volume   Culture   Final    NO GROWTH 5 DAYS Performed at [facility], [address] [place], [place] [address]    Report Status 04/27/2020 FINAL  Final  CULTURE, BLOOD (  ROUTINE X 2) w Reflex to ID Panel     Status: None   Collection Time: 04/22/20  6:17 PM   Specimen: BLOOD  Result Value Ref Range Status   Specimen Description BLOOD FOOT  Final   Special Requests   Final    BOTTLES  DRAWN AEROBIC AND ANAEROBIC Blood Culture results may not be optimal due to an excessive volume of blood received in culture bottles   Culture   Final    NO GROWTH 5 DAYS Performed at [facility], [address], [place], [place] [address]    Report Status 04/27/2020 FINAL  Final    Coagulation Studies: No results for input(s): LABPROT, INR in the last 72 hours.  Urinalysis: No results for input(s): COLORURINE, LABSPEC, PHURINE, GLUCOSEU, HGBUR, BILIRUBINUR, KETONESUR, PROTEINUR, UROBILINOGEN, NITRITE, LEUKOCYTESUR in the last 72 hours.  Invalid input(s): APPERANCEUR    Imaging: CT ABDOMEN PELVIS WO CONTRAST  Result Date: 04/29/2020 CLINICAL DATA:  Left lower quadrant abdominal pain. History of diverticulitis complicated by abscess. EXAM: CT ABDOMEN AND PELVIS WITHOUT CONTRAST TECHNIQUE: Multidetector CT imaging of the abdomen and pelvis was performed following the standard protocol without IV contrast. COMPARISON:  CT abdomen pelvis dated 04/18/2020. FINDINGS: Lower chest: There is a moderate right pleural effusion with associated atelectasis. There is mild left basilar atelectasis. Hepatobiliary: No focal liver abnormality is seen. No gallstones, gallbladder wall thickening, or biliary dilatation. Pancreas: Unremarkable. No pancreatic ductal dilatation or surrounding inflammatory changes. Spleen: Normal in size without focal abnormality. Adrenals/Urinary Tract: Adrenal glands are unremarkable. Kidneys are normal, without renal calculi, focal lesion, or hydronephrosis. Bladder is unremarkable. Stomach/Bowel: Stomach is within normal limits. Enteric contrast reaches the rectum. Multiple mildly dilated loops of small bowel are seen in the mid abdomen without a transition point to decompressed distal bowel, similar to prior exam. Appendix appears normal. There is sigmoid diverticulosis without significant associated inflammatory changes. Vascular/Lymphatic: Aortic atherosclerosis. No  enlarged abdominal or pelvic lymph nodes. Reproductive: Uterus and bilateral adnexa are unremarkable. Other: There is a moderate volume intraperitoneal fluid, increased since 04/18/2020. No definite well-defined fluid collection to suggest abscess is identified, however evaluation is limited due to the lack of intravenous contrast. A locule of gas in the lower midline abdomen (series 2, image 67) is favored to be located within a loop of bowel. A pigtail catheter is seen in the lower abdomen, slightly retracted since prior exam. Anasarca is noted. No abdominal wall hernia. Musculoskeletal: Spinal fusion hardware is seen from L3-L5. Degenerative changes are seen in the spine. IMPRESSION: 1. Moderate volume intraperitoneal fluid, increased since 04/18/2020. No definite well-defined fluid collection to suggest abscess is identified, however evaluation is limited due to the lack of intravenous contrast. A pigtail catheter has been partially retracted but remains located in the lower abdomen. 2. Multiple mildly dilated loops of small bowel in the mid abdomen are similar to prior exam and likely reflect ileus. 3. Moderate right pleural effusion with associated atelectasis. Aortic Atherosclerosis (ICD10-I70.0). Electronically Signed   By: Zerita Boers M.D.   On: 04/29/2020 19:47     Medications:   . sodium chloride Stopped (05/01/20 0900)  . meropenem (MERREM) IV 500 mg (05/01/20 1015)   . acidophilus  1 capsule Oral Daily  . alteplase  2 mg Intracatheter Once  . alteplase  2 mg Intracatheter Once  . amiodarone  200 mg Oral BID  . Chlorhexidine Gluconate Cloth  6 each Topical Daily  . docusate sodium  200 mg Oral BID  .  rilpivirine  25 mg Oral Q breakfast   And  . dolutegravir  50 mg Oral Q breakfast  . famotidine  10 mg Oral Q1200  . feeding supplement (NEPRO CARB STEADY)  237 mL Oral BID BM  . guaiFENesin-dextromethorphan  10 mL Oral Q8H  . heparin injection (subcutaneous)  5,000 Units Subcutaneous  Q8H  . mometasone-formoterol  2 puff Inhalation BID  . multivitamin  1 tablet Oral QHS  . pregabalin  25 mg Oral Daily  . sodium chloride flush  5 mL Intracatheter Q8H  . sodium zirconium cyclosilicate  10 g Oral Daily   acetaminophen **OR** acetaminophen, albuterol, ALPRAZolam, bisacodyl, diphenhydrAMINE, heparin NICU/SCN flush, HYDROmorphone (DILAUDID) injection, iohexol, ipratropium-albuterol, labetalol, magic mouthwash **AND** lidocaine, magnesium hydroxide, menthol-cetylpyridinium, oxyCODONE-acetaminophen, phenol, simethicone  Assessment/ Plan:  Ms. ALLEEN KEHM is a 59 y.o. black female with HIV, COPD, hypertension, GERD, who is admitted to [facility] on 04/14/2020 for Dyspnea [R06.00] Colonic diverticular abscess [K57.20] Diverticulitis of large intestine with abscess without bleeding [K57.20] Abscess of sigmoid colon due to diverticulitis [K57.20]  1. Acute renal failure with hyperkalemia: with no history of chronic kidney disease. History of bland urine.  Requiring hemodialysis. First treatment on 8/18. Seen and examined on hemodialysis.  IV contrast exposure on 8/16. Ultrasound reviewed, no evidence of HIV induced nephropathy - Continue MWF schedule and monitor for renal recovery.  - lokelma   2. Hypertension: hypotensive episode on dialysis. Holding blood pressure agents. Home regimen of losartan, hydrochlorothiazide and amlodipine being held.   3. Anemia of kidney failure: hemoglobin 6.9 - will initiate epo with next dialysis treatment.  - Low threshold for PRBC transfusion.    LOS: [place] 9/2/202110:51 AM

## 2020-05-01 NOTE — Progress Notes (Signed)
Blood product started, patient tolerating well thus far. Daughter at bedside. Notified MD of MEWS-3. Will continue to monitor.

## 2020-05-02 ENCOUNTER — Inpatient Hospital Stay: Payer: Medicare Other

## 2020-05-02 DIAGNOSIS — R601 Generalized edema: Secondary | ICD-10-CM

## 2020-05-02 DIAGNOSIS — I4891 Unspecified atrial fibrillation: Secondary | ICD-10-CM

## 2020-05-02 DIAGNOSIS — R6521 Severe sepsis with septic shock: Secondary | ICD-10-CM | POA: Diagnosis not present

## 2020-05-02 DIAGNOSIS — N179 Acute kidney failure, unspecified: Secondary | ICD-10-CM | POA: Diagnosis not present

## 2020-05-02 DIAGNOSIS — I959 Hypotension, unspecified: Secondary | ICD-10-CM

## 2020-05-02 DIAGNOSIS — D72829 Elevated white blood cell count, unspecified: Secondary | ICD-10-CM

## 2020-05-02 DIAGNOSIS — K5720 Diverticulitis of large intestine with perforation and abscess without bleeding: Secondary | ICD-10-CM | POA: Diagnosis not present

## 2020-05-02 DIAGNOSIS — A419 Sepsis, unspecified organism: Secondary | ICD-10-CM | POA: Diagnosis not present

## 2020-05-02 LAB — CBC WITH DIFFERENTIAL/PLATELET
Abs Immature Granulocytes: 0.24 10*3/uL — ABNORMAL HIGH (ref 0.00–0.07)
Basophils Absolute: 0 10*3/uL (ref 0.0–0.1)
Basophils Relative: 0 %
Eosinophils Absolute: 0.1 10*3/uL (ref 0.0–0.5)
Eosinophils Relative: 0 %
HCT: 21.4 % — ABNORMAL LOW (ref 36.0–46.0)
Hemoglobin: 7.2 g/dL — ABNORMAL LOW (ref 12.0–15.0)
Immature Granulocytes: 1 %
Lymphocytes Relative: 3 %
Lymphs Abs: 0.7 10*3/uL (ref 0.7–4.0)
MCH: 30.5 pg (ref 26.0–34.0)
MCHC: 33.6 g/dL (ref 30.0–36.0)
MCV: 90.7 fL (ref 80.0–100.0)
Monocytes Absolute: 0.5 10*3/uL (ref 0.1–1.0)
Monocytes Relative: 2 %
Neutro Abs: 21.6 10*3/uL — ABNORMAL HIGH (ref 1.7–7.7)
Neutrophils Relative %: 94 %
Platelets: 152 10*3/uL (ref 150–400)
RBC: 2.36 MIL/uL — ABNORMAL LOW (ref 3.87–5.11)
RDW: 17.2 % — ABNORMAL HIGH (ref 11.5–15.5)
Smear Review: NORMAL
WBC: 23.1 10*3/uL — ABNORMAL HIGH (ref 4.0–10.5)
nRBC: 0 % (ref 0.0–0.2)

## 2020-05-02 LAB — RENAL FUNCTION PANEL
Albumin: 1.4 g/dL — ABNORMAL LOW (ref 3.5–5.0)
Anion gap: 13 (ref 5–15)
BUN: 67 mg/dL — ABNORMAL HIGH (ref 6–20)
CO2: 24 mmol/L (ref 22–32)
Calcium: 7.8 mg/dL — ABNORMAL LOW (ref 8.9–10.3)
Chloride: 97 mmol/L — ABNORMAL LOW (ref 98–111)
Creatinine, Ser: 4.71 mg/dL — ABNORMAL HIGH (ref 0.44–1.00)
GFR calc Af Amer: 11 mL/min — ABNORMAL LOW (ref >60)
GFR calc non Af Amer: 10 mL/min — ABNORMAL LOW (ref >60)
Glucose, Bld: 76 mg/dL (ref 70–99)
Phosphorus: 7 mg/dL — ABNORMAL HIGH (ref 2.5–4.6)
Potassium: 4.2 mmol/L (ref 3.5–5.1)
Sodium: 134 mmol/L — ABNORMAL LOW (ref 135–145)

## 2020-05-02 LAB — MAGNESIUM: Magnesium: 1.8 mg/dL (ref 1.7–2.4)

## 2020-05-02 MED ORDER — SODIUM CHLORIDE 0.9 % IV SOLN
200.0000 mg | Freq: Once | INTRAVENOUS | Status: AC
  Administered 2020-05-03: 200 mg via INTRAVENOUS
  Filled 2020-05-02: qty 200

## 2020-05-02 MED ORDER — SODIUM CHLORIDE 0.9 % IV SOLN
100.0000 mg | INTRAVENOUS | Status: DC
  Administered 2020-05-03 – 2020-05-19 (×14): 100 mg via INTRAVENOUS
  Filled 2020-05-02 (×16): qty 100

## 2020-05-02 MED ORDER — EPOETIN ALFA 10000 UNIT/ML IJ SOLN
10000.0000 [IU] | INTRAMUSCULAR | Status: DC
  Administered 2020-05-07 – 2020-05-16 (×4): 10000 [IU] via INTRAVENOUS

## 2020-05-02 NOTE — Progress Notes (Signed)
Emptied JP drain of 36mls creamy-looking, tan/pink exudate. Foul smelling. Recharged drain

## 2020-05-02 NOTE — Progress Notes (Signed)
See normal temperature value in FLOWSHEET.

## 2020-05-02 NOTE — Care Management Important Message (Signed)
Important Message  Patient Details  Name: Melanie Bowen MRN: [id number] Date of Birth: 1960/10/10   Medicare Important Message Given:  Yes     Dannette Barbara 05/02/2020, 3:05 PM

## 2020-05-02 NOTE — Progress Notes (Signed)
Progress Note    Melanie Bowen  NLZ:[id number] DOB: Dec 28, 1960  DOA: 04/14/2020 PCP: Theotis Burrow, MD      Brief Narrative:    Medical records reviewed and are as summarized below:  Melanie Bowen is a 59 y.o. female       Assessment/Plan:   Principal Problem:   Severe sepsis with septic shock ([place]) Active Problems:   HTN (hypertension)   HIV (human immunodeficiency virus infection) ([place])   Diverticulitis of large intestine with abscess without bleeding   Abscess of sigmoid colon due to diverticulitis   Acute renal failure (ARF) ([place])   Hypotension   Diverticulosis   Acute respiratory distress   DNR (do not resuscitate) discussion   Palliative care by specialist   Dyspnea    Nutrition Problem: Increased nutrient needs Etiology: chronic illness (COPD, new HD)  Signs/Symptoms: estimated needs   Body mass index is 48.1 kg/m.  (Morbid obesity): This complicates overall care and prognosis   Septic shock - resolved  Acute sigmoid diverticulitis and diverticular abscess, significant leukocytosis- s/p CT-guided drainage by IR on 04/14/2020.  Fluid culture showed Klebsiella pneumonia, pseudomonas aeruginosa and E. coli.  No gallbladder abscess as reported previously (this was an error)  Acute kidney injury complicated by hyperkalemia and metabolic acidosis  Atrial fibrillation with RVR-converted to normal sinus rhythm  Acute anemia, likely multifactorial poor oral intake and acute illness (no evidence of bleeding thus far)  Sinus tachycardia  COPD exacerbation-resolved  HIV infection  Acute liver failure-resolved  Acute hypoxemic respiratory failure-resolved  Acute metabolic encephalopathy/delirium -recurrent  PLAN  S/p transfusion with 1 unit of PRBCs on 05/01/2020.  Monitor H&H and transfuse as needed.  Obtain CT head for further evaluation of worsening mental status Continue IV meropenem.  Case discussed with ID, Dr.  Steva Ready. She had hemodialysis today.  Follow-up with nephrologist. Continue antiretroviral therapy Continue bronchodilators Continue amiodarone.  Consideration for long-term anticoagulation in the future Turn patient every 2 hours.  Ordered low air mattress.   Diet Order            Diet renal with fluid restriction Room service appropriate? Yes; Fluid consistency: Thin  Diet effective now                    Consultants:  General surgeon  Cardiologist  Nephrologist  Vascular surgeon  Procedures:  CT-guided drainage of diverticular abscess on 04/14/2020    Medications:   . (feeding supplement) PROSource Plus  30 mL Oral TID BM  . acidophilus  1 capsule Oral Daily  . alteplase  2 mg Intracatheter Once  . alteplase  2 mg Intracatheter Once  . amiodarone  200 mg Oral BID  . Chlorhexidine Gluconate Cloth  6 each Topical Daily  . docusate sodium  200 mg Oral BID  . rilpivirine  25 mg Oral Q breakfast   And  . dolutegravir  50 mg Oral Q breakfast  . famotidine  10 mg Oral Q1200  . feeding supplement (NEPRO CARB STEADY)  237 mL Oral TID BM  . guaiFENesin-dextromethorphan  10 mL Oral Q8H  . heparin injection (subcutaneous)  5,000 Units Subcutaneous Q8H  . mometasone-formoterol  2 puff Inhalation BID  . multivitamin  1 tablet Oral QHS  . pregabalin  25 mg Oral Daily  . sodium chloride flush  5 mL Intracatheter Q8H  . sodium zirconium cyclosilicate  10 g Oral Daily   Continuous Infusions: . sodium chloride Stopped (05/01/20  0900)  . meropenem (MERREM) IV 500 mg (05/02/20 1417)     Anti-infectives (From admission, onward)   Start     Dose/Rate Route Frequency Ordered Stop   04/30/20 0800  rilpivirine (EDURANT) tablet 25 mg       "And" Linked Group Details   25 mg Oral Daily with breakfast 04/29/20 1254     04/30/20 0800  dolutegravir (TIVICAY) tablet 50 mg       "And" Linked Group Details   50 mg Oral Daily with breakfast 04/29/20 1254     04/28/20 2200   meropenem (MERREM) 500 mg in sodium chloride 0.9 % 100 mL IVPB        500 mg 200 mL/hr over 30 Minutes Intravenous Every 12 hours 04/28/20 1546     04/26/20 2200  meropenem (MERREM) 500 mg in sodium chloride 0.9 % 100 mL IVPB  Status:  Discontinued        500 mg 200 mL/hr over 30 Minutes Intravenous Every 8 hours 04/26/20 1258 04/28/20 1546   04/25/20 1700  rilpivirine (EDURANT) tablet 25 mg  Status:  Discontinued        25 mg Oral Daily with breakfast 04/24/20 1636 04/25/20 1111   04/25/20 1200  rilpivirine (EDURANT) tablet 25 mg  Status:  Discontinued        25 mg Oral Daily with breakfast 04/25/20 1111 04/29/20 1254   04/25/20 1000  meropenem (MERREM) 500 mg in sodium chloride 0.9 % 100 mL IVPB  Status:  Discontinued        500 mg 200 mL/hr over 30 Minutes Intravenous Every 12 hours 04/25/20 0935 04/26/20 1258   04/24/20 1700  darunavir-cobicistat (PREZCOBIX) 800-150 MG per tablet 1 tablet  Status:  Discontinued        1 tablet Oral Daily with breakfast 04/24/20 1231 04/24/20 1636   04/24/20 1330  dolutegravir (TIVICAY) tablet 50 mg  Status:  Discontinued        50 mg Oral Daily 04/24/20 1231 04/29/20 1254   04/23/20 2200  meropenem (MERREM) 500 mg in sodium chloride 0.9 % 100 mL IVPB  Status:  Discontinued        500 mg 200 mL/hr over 30 Minutes Intravenous Every 24 hours 04/23/20 1127 04/25/20 0935   04/18/20 2000  levofloxacin (LEVAQUIN) IVPB 750 mg        750 mg 100 mL/hr over 90 Minutes Intravenous  Once 04/18/20 1857 04/19/20 0004   04/18/20 1800  levofloxacin (LEVAQUIN) IVPB 750 mg  Status:  Discontinued        750 mg 100 mL/hr over 90 Minutes Intravenous  Once 04/18/20 1640 04/18/20 1857   04/16/20 2200  anidulafungin (ERAXIS) 100 mg in sodium chloride 0.9 % 100 mL IVPB        100 mg 78 mL/hr over 100 Minutes Intravenous Every 24 hours 04/16/20 1707 04/22/20 0211   04/15/20 2200  valACYclovir (VALTREX) tablet 500 mg  Status:  Discontinued        500 mg Oral Daily at bedtime  04/15/20 1248 04/16/20 1259   04/15/20 2200  anidulafungin (ERAXIS) 200 mg in sodium chloride 0.9 % 200 mL IVPB        200 mg 78 mL/hr over 200 Minutes Intravenous  Once 04/15/20 2008 04/16/20 0327   04/15/20 2200  meropenem (MERREM) 1 g in sodium chloride 0.9 % 100 mL IVPB  Status:  Discontinued        1 g 200 mL/hr over 30 Minutes Intravenous Every  12 hours 04/15/20 2017 04/23/20 1127   04/15/20 1400  piperacillin-tazobactam (ZOSYN) IVPB 3.375 g  Status:  Discontinued        3.375 g 12.5 mL/hr over 240 Minutes Intravenous Every 8 hours 04/15/20 1300 04/15/20 2006   04/14/20 2200  elvitegravir-cobicistat-emtricitabine-tenofovir (GENVOYA) 150-150-200-10 MG tablet 1 tablet  Status:  Discontinued        1 tablet Oral Daily at bedtime 04/14/20 0235 04/15/20 0957   04/14/20 2200  valACYclovir (VALTREX) tablet 1,000 mg  Status:  Discontinued        1,000 mg Oral Daily at bedtime 04/14/20 0235 04/15/20 1248   04/14/20 1230  cefTRIAXone (ROCEPHIN) 2 g in sodium chloride 0.9 % 100 mL IVPB  Status:  Discontinued        2 g 200 mL/hr over 30 Minutes Intravenous Every 24 hours 04/14/20 1223 04/15/20 1300   04/14/20 1230  metroNIDAZOLE (FLAGYL) IVPB 500 mg  Status:  Discontinued        500 mg 100 mL/hr over 60 Minutes Intravenous Every 8 hours 04/14/20 1223 04/15/20 1636   04/14/20 1030  piperacillin-tazobactam (ZOSYN) IVPB 3.375 g  Status:  Discontinued        3.375 g 12.5 mL/hr over 240 Minutes Intravenous Every 8 hours 04/14/20 0235 04/14/20 1223   04/14/20 0215  piperacillin-tazobactam (ZOSYN) IVPB 4.5 g  Status:  Discontinued        4.5 g 200 mL/hr over 30 Minutes Intravenous  Once 04/14/20 0200 04/14/20 0208   04/14/20 0215  piperacillin-tazobactam (ZOSYN) IVPB 3.375 g        3.375 g 100 mL/hr over 30 Minutes Intravenous  Once 04/14/20 0208 04/14/20 0301             Family Communication/Anticipated D/C date and plan/Code Status   DVT prophylaxis: heparin injection 5,000 Units  Start: 04/23/20 0800 because of high risk for DVT     Code Status: Partial Code  Family Communication: Plan discussed with the sister, Chrys Racer, at the bedside Disposition Plan:    Status is: Inpatient  Remains inpatient appropriate because:Inpatient level of care appropriate due to severity of illness and Requiring hemodialysis for acute kidney injury   Dispo: The patient is from: Home              Anticipated d/c is to: SNF              Anticipated d/c date is: > 3 days              Patient currently is not medically stable to d/c.           Subjective:   Interval events noted.  Patient has been tachycardic and confused.  According to her nurse, sometimes she refuses care.  Objective:    Vitals:   05/02/20 0746 05/02/20 0815 05/02/20 1433 05/02/20 1556  BP: (!) 116/59  109/61 106/70  Pulse: (!) 107  (!) 119 (!) 118  Resp: 20  20   Temp: 99.9 F (37.7 C)  99.7 F (37.6 C) (!) 100.4 F (38 C)  TempSrc: Oral  Oral Oral  SpO2: 97% 98% 99% 96%  Weight:      Height:       No data found.   Intake/Output Summary (Last 24 hours) at 05/02/2020 1634 Last data filed at 05/02/2020 0700 Gross per 24 hour  Intake 420 ml  Output 30 ml  Net 390 ml   Filed Weights   04/26/20 0458 04/29/20 0500 05/02/20 0358  Weight: 131.3 kg 134.4 kg 135.2 kg    Exam:  GEN: No acute distress SKIN: Warm and dry.  She has raw areas on bilateral buttocks and bilateral upper thighs (moisture associated skin damage).  Excoriations noted under bilateral breasts and skin folds in lower abdominal/inguinal regions. EYES: Anicteric but pale ENT: MMM CV: Tachycardic PULM: No wheezing or rales heard ABD: Soft, nontender, obese CNS: Alert and oriented to person and place.  She moves all 4 extremities EXT: Bilateral leg edema (3+), no tenderness    She was seen with Danae Chen, RN, at the bedside   Data Reviewed:   I have personally reviewed following labs and imaging  studies:  Labs: Labs show the following:   Basic Metabolic Panel: Recent Labs  Lab 04/28/20 0640 04/28/20 0640 04/29/20 0457 04/29/20 0457 04/30/20 0827 04/30/20 0827 05/01/20 0740 05/02/20 0945  NA 134*  --  134*  --  137  --  134* 134*  K 4.5   < > 4.6   < > 5.9*   < > 4.1 4.2  CL 98  --  98  --  101  --  96* 97*  CO2 24  --  24  --  15*  --  25 24  GLUCOSE 107*  --  78  --  64*  --  73 76  BUN 77*  --  84*  --  91*  --  55* 67*  CREATININE 3.86*  --  4.62*  --  5.23*  --  3.93* 4.71*  CALCIUM 7.6*  --  7.6*  --  7.3*  --  7.6* 7.8*  MG 1.7  --  1.8  --  1.7  --  1.7 1.8  PHOS 6.1*  --  6.9*  --  8.5*  --  6.2* 7.0*   < > = values in this interval not displayed.   GFR Estimated Creatinine Clearance: 18.4 mL/min (A) (by C-G formula based on SCr of 4.71 mg/dL (H)). Liver Function Tests: Recent Labs  Lab 05/01/20 0705 05/02/20 0945  AST 28  --   ALT 17  --   ALKPHOS 97  --   BILITOT 1.4*  --   PROT 5.3*  --   ALBUMIN 1.4* 1.4*   No results for input(s): LIPASE, AMYLASE in the last 168 hours. No results for input(s): AMMONIA in the last 168 hours. Coagulation profile No results for input(s): INR, PROTIME in the last 168 hours.  CBC: Recent Labs  Lab 04/28/20 0622 04/29/20 0457 04/30/20 0827 05/01/20 0830 05/02/20 0945  WBC 33.1* 24.2* 29.8* 27.4* 23.1*  NEUTROABS  --   --   --  25.1* 21.6*  HGB 9.0* 8.3* 8.4* 6.9* 7.2*  HCT 27.2* 24.4* 22.9* 19.3* 21.4*  MCV 92.5 91.0 86.7 88.1 90.7  PLT 193 165 135* 151 152   Cardiac Enzymes: No results for input(s): CKTOTAL, CKMB, CKMBINDEX, TROPONINI in the last 168 hours. BNP (last 3 results) No results for input(s): PROBNP in the last 8760 hours. CBG: No results for input(s): GLUCAP in the last 168 hours. D-Dimer: No results for input(s): DDIMER in the last 72 hours. Hgb A1c: No results for input(s): HGBA1C in the last 72 hours. Lipid Profile: No results for input(s): CHOL, HDL, LDLCALC, TRIG, CHOLHDL,  LDLDIRECT in the last 72 hours. Thyroid function studies: No results for input(s): TSH, T4TOTAL, T3FREE, THYROIDAB in the last 72 hours.  Invalid input(s): FREET3 Anemia work up: No results for input(s): VITAMINB12, FOLATE, FERRITIN, TIBC, IRON,  RETICCTPCT in the last 72 hours. Sepsis Labs: Recent Labs  Lab 04/29/20 0457 04/30/20 0827 05/01/20 0830 05/02/20 0945  WBC 24.2* 29.8* 27.4* 23.1*    Microbiology Recent Results (from the past 240 hour(s))  CULTURE, BLOOD (ROUTINE X 2) w Reflex to ID Panel     Status: None   Collection Time: 04/22/20  6:12 PM   Specimen: BLOOD  Result Value Ref Range Status   Specimen Description BLOOD FOOT  Final   Special Requests   Final    BOTTLES DRAWN AEROBIC AND ANAEROBIC Blood Culture adequate volume   Culture   Final    NO GROWTH 5 DAYS Performed at [facility], [address] [place], [place] [address]    Report Status 04/27/2020 FINAL  Final  CULTURE, BLOOD (ROUTINE X 2) w Reflex to ID Panel     Status: None   Collection Time: 04/22/20  6:17 PM   Specimen: BLOOD  Result Value Ref Range Status   Specimen Description BLOOD FOOT  Final   Special Requests   Final    BOTTLES DRAWN AEROBIC AND ANAEROBIC Blood Culture results may not be optimal due to an excessive volume of blood received in culture bottles   Culture   Final    NO GROWTH 5 DAYS Performed at [facility], [address] [place], [place] [address]    Report Status 04/27/2020 FINAL  Final  CULTURE, BLOOD (ROUTINE X 2) w Reflex to ID Panel     Status: None (Preliminary result)   Collection Time: 05/01/20  2:50 PM   Specimen: BLOOD  Result Value Ref Range Status   Specimen Description BLOOD BLOOD LEFT HAND  Final   Special Requests   Final    BOTTLES DRAWN AEROBIC AND ANAEROBIC Blood Culture adequate volume   Culture   Final    NO GROWTH < 12 HOURS Performed at [facility], [address] [place], [place] [address]    Report Status  PENDING  Incomplete  CULTURE, BLOOD (ROUTINE X 2) w Reflex to ID Panel     Status: None (Preliminary result)   Collection Time: 05/01/20  2:51 PM   Specimen: BLOOD  Result Value Ref Range Status   Specimen Description BLOOD BLOOD RIGHT HAND  Final   Special Requests   Final    BOTTLES DRAWN AEROBIC AND ANAEROBIC Blood Culture adequate volume   Culture   Final    NO GROWTH < 12 HOURS Performed at [facility], [address] [place],  [address]    Report Status PENDING  Incomplete    Procedures and diagnostic studies:  No results found.             LOS: 18 days   Peyton Copywriter, advertising on [email or web address]. If 7PM-7AM, please contact night-coverage at [email or web address]     05/02/2020, 4:34 PM

## 2020-05-02 NOTE — Consult Note (Signed)
[place] Nurse Consult Note: Reason for Consult:MASD to gluteal fold and inframammary region. Present on admission.  RLQ JP drain in place due to diverticular abscess.  Wound type: MASD- BMI 48 with large pendulous breasts.  Pressure Injury POA: NA Measurement: scattered nonintact skin to breasts and gluteal fold Wound TOI:ZTIW and moist Drainage (amount, consistency, odor) scant weeping  No odor.  Periwound: intact Dressing procedure/placement/frequency: Cleanse skin to breasts and buttocks with soap and water.  Apply interdry Ag Measure and cut length of InterDry to fit in skin folds that have skin breakdown Tuck InterDry fabric into skin folds in a single layer, allow for 2 inches of overhang from skin edges to allow for wicking to occur May remove to bathe; dry area thoroughly and then tuck into affected areas again Do not apply any creams or ointments when using InterDry DO NOT THROW AWAY FOR 5 DAYS unless soiled with stool DO NOT [facility] product, this will inactivate the silver in the material  New sheet of Interdry should be applied after 5 days of use if patient continues to have skin breakdown   Will not follow at this time.  Please re-consult if needed.  Domenic Moras MSN, RN, FNP-BC CWON Wound, Ostomy, Continence Nurse Pager [phone]

## 2020-05-02 NOTE — Progress Notes (Signed)
Pt very lethargic, HR sustaining in the 140's-160's. Pt asymptomatic, not in any acute distress, Dr. Mal Misty at bedside and aware. BP 109/61, HR 160, RR 20, O2sat 99 on 2L N/C. Pt complaining of pain on her buttocks, Per Dr. Mal Misty will order head CT and IV diludid. Will continue to monitor closely.

## 2020-05-02 NOTE — Progress Notes (Signed)
PT Cancellation Note  Patient Details Name: Melanie Bowen MRN: [id number] DOB: 1960/10/12   Cancelled Treatment:     Pt was long sitting in bed. Very lethargic and slightly labored breathing. HR  tachy. Occasionally reading 160 bpm but mostly staying around 120 bpm while just laying in bed. BP 103/34. RN notified and came to room. This was 2nd attempt this afternoon to see pt. Pt's supportive sister was present. Acute PT will continue our efforts to encourage OOB/physical activity. Messaged MD about possible order for specialty bed for wound management.      Willette Pa 05/02/2020, 4:06 PM

## 2020-05-02 NOTE — Progress Notes (Addendum)
Date of Admission:  04/14/2020  Meropenem since 04/15/20  Pt is not giving any history Says I dont know     ID: Melanie Bowen is a 59 y.o. female  Principal Problem:   Severe sepsis with septic shock ([place]) Active Problems:   HTN (hypertension)   HIV (human immunodeficiency virus infection) ([place])   Diverticulitis of large intestine with abscess without bleeding   Abscess of sigmoid colon due to diverticulitis   Acute renal failure (ARF) ([place])   Hypotension   Diverticulosis   Acute respiratory distress   DNR (do not resuscitate) discussion   Palliative care by specialist   Dyspnea   Medications:  . (feeding supplement) PROSource Plus  30 mL Oral TID BM  . acidophilus  1 capsule Oral Daily  . alteplase  2 mg Intracatheter Once  . alteplase  2 mg Intracatheter Once  . amiodarone  200 mg Oral BID  . Chlorhexidine Gluconate Cloth  6 each Topical Daily  . docusate sodium  200 mg Oral BID  . rilpivirine  25 mg Oral Q breakfast   And  . dolutegravir  50 mg Oral Q breakfast  . famotidine  10 mg Oral Q1200  . feeding supplement (NEPRO CARB STEADY)  237 mL Oral TID BM  . guaiFENesin-dextromethorphan  10 mL Oral Q8H  . heparin injection (subcutaneous)  5,000 Units Subcutaneous Q8H  . mometasone-formoterol  2 puff Inhalation BID  . multivitamin  1 tablet Oral QHS  . pregabalin  25 mg Oral Daily  . sodium chloride flush  5 mL Intracatheter Q8H  . sodium zirconium cyclosilicate  10 g Oral Daily    Objective: Vital signs in last 24 hours: Temp:  [98.2 F (36.8 C)-100.8 F (38.2 C)] 99.7 F (37.6 C) (09/03 1433) Pulse Rate:  [97-119] 119 (09/03 1433) Resp:  [19-27] 20 (09/03 1433) BP: (92-119)/(47-65) 109/61 (09/03 1433) SpO2:  [95 %-100 %] 99 % (09/03 1433) Weight:  [135.2 kg] 135.2 kg (09/03 0358)  PHYSICAL EXAM:  General: lethargic, some respiratory distress, oriented in person , able to say her daughter;s name.  Head: Normocephalic, without obvious abnormality,  atraumatic. Eyes: Conjunctivae clear, anicteric sclerae. Pupils are equal Lips dry  Neck: Supple, Lungs: b/l air entry- decreased rt side Heart: Tachycardia Abdomen: Soft,distended. Left lower quadrant drain- 10 cc of purulent fluid Extremities: edema legs, edema rt arm  Skin: superficial tears back/intergluteal Lymph: Cervical, supraclavicular normal. Neurologic:did not assess in detail  Lab Results  Recent Labs    04/30/20 0827 05/01/20 0740 05/01/20 0830 05/02/20 0945  WBC   < >  --  27.4* 23.1*  HGB   < >  --  6.9* 7.2*  HCT   < >  --  19.3* 21.4*  NA  --  134*  --  134*  K  --  4.1  --  4.2  CL  --  96*  --  97*  CO2  --  25  --  24  BUN  --  55*  --  67*  CREATININE  --  3.93*  --  4.71*   < > = values in this interval not displayed.   Liver Panel Recent Labs    05/01/20 0705 05/02/20 0945  PROT 5.3*  --   ALBUMIN 1.4* 1.4*  AST 28  --   ALT 17  --   ALKPHOS 97  --   BILITOT 1.4*  --   BILIDIR 0.5*  --   IBILI 0.9  --  Sedimentation Rate No results for input(s): ESRSEDRATE in the last 72 hours. C-Reactive Protein No results for input(s): CRP in the last 72 hours.  Microbiology:  Studies/Results: 04/28/20 CT abdomen: Moderate volume intraperitoneal fluid, increased since 04/18/2020. No definite well-defined fluid collection to suggest abscess is identified, however evaluation is limited due to the lack of intravenous contrast. A pigtail catheter has been partially retracted but remains located in the lower abdomen. 2. Multiple mildly dilated loops of small bowel in the mid abdomen are similar to prior exam and likely reflect ileus. 3. Moderate right pleural effusion with associated atelectasis.  Assessment/Plan:  Septic shocksecondary to perforated diverticulitisand peridiverticular abscess. pseudomonas, kleb andESBLe.coli in the culture. JP drain inserted on 04/14/20.  Onmeropenem since 04/15/20>> anidulafungin 8/17>>8/24 She had  improved and was out of ICU but now  Fever since 8/30 low grade- also has leucocytosis  need to tap her ascitic fluid and send for culture If the JP drain is not needed recommend dcing it-   Hypoxic resp failure-was extubated but now the resp status  very likely fluid overload- would repeat CXR and she may benefit from pleurocentesis on the rt side  Encephalopathy- could be a combination of hypoxia, AKI, infection CT head N Repeat blood culture neg so far Will add anidulafungin  Transaminitis due to shock liver resolved  AKI due to septic shock-getting dialysis  Anasarca Rt arm swelling- DVT was ruled out on 04/22/20- may have to repeat imaging   HIV- well controlled in jan 2021 her Vl <20 and cd4 >1000. She was getting care at [facility] and saw her provider last in June 2021  Alphonzo Cruise. This has 4 different meds and as  we need to adjust the dose because of AKI. Chose a Non NRTI regimen to dolutegravir+rilpavirine on 8/ instead Dolutegravir + prezcobix ( darunavir+ cobi) VS  Because of encephalopathy and worsening clinical picture will hold this new regimen for now until the dust settles  Last CD4 count is 265 with 33% on 8/16/2021and Vl <20 Prior to that in Jan 2021 Vl < 20 and cd4 1000    COPD-she was on steroids this admission which has  been discontinued  Afib on amiodarone   Transaminitis due to shock liver resolved  Thrombocytopenia-resolved  Discussed the management with  Hospitalist ID will follow her remotely this weekend- call if needed

## 2020-05-02 NOTE — Progress Notes (Signed)
PT Cancellation Note  Patient Details Name: Melanie Bowen MRN: [id number] DOB: Jan 12, 1961   Cancelled Treatment:     Pt currently in HD. Therapist will return later this afternoon. Did reach out to pt's daughter to discuss care and progress with therapies. She is very supportive and understanding of situation. PT will continue to encourage pt to participate and progress pt towards established goals. Pt has been very limited by participation and willingness to work with PT/OT. Needs to be OOB as much as possible via lift if pt unwilling to stand pivot.    Willette Pa 05/02/2020, 11:48 AM

## 2020-05-02 NOTE — Progress Notes (Signed)
Temp post-infusion 100.8; administered 650mg  Tylenol. Will monitor for effectiveness.

## 2020-05-02 NOTE — Consult Note (Signed)
Pharmacy Antibiotic Note  Melanie Bowen is a 59 y.o. female admitted on 04/14/2020 with intra-abdominal infection - decompensating on Zosyn. Source of infection - perforated diverticulitis and peridiverticular abscess Pharmacy has been consulted for Meropenem dosing.  Pt was receiving HD for acute renal failure 8/18-8/19. Cath was pulled due to infection and thrombosis concern. HD restarted 9/1. I/O's show urine output of 400 ml on 9/1 (some return of kidney function).  8/16 wound cx: ABUNDANT KLEBSIELLA PNEUMONIAE  ABUNDANT PSEUDOMONAS AERUGINOSA  MODERATE ESCHERICHIA COLI  MIXED ANAEROBIC FLORA PRESENT.  (Klebsiella, E Coli, and pseudomonas sensitive to imipenem)  ID following   Plan:  - Pt renal function shifting daily.  - Currently receiving meropenem 500mg  q12H. - Monitoring renal function and UOP closely since HD restarted. - Following nephrology notes and plan and expectations to guide dosing.   Height: 5\' 6"  (167.6 cm) Weight: 135.2 kg (298 lb) IBW/kg (Calculated) : 59.3  Temp (24hrs), Avg:99.3 F (37.4 C), Min:98.2 F (36.8 C), Max:100.8 F (38.2 C)  Recent Labs  Lab 04/27/20 0432 04/28/20 0622 04/28/20 0640 04/29/20 0457 04/30/20 0827 05/01/20 0740 05/01/20 0830 05/02/20 0945  WBC  --  33.1*  --  24.2* 29.8*  --  27.4* 23.1*  CREATININE   < >  --  3.86* 4.62* 5.23* 3.93*  --  4.71*   < > = values in this interval not displayed.    Estimated Creatinine Clearance: 18.4 mL/min (A) (by C-G formula based on SCr of 4.71 mg/dL (H)).    Allergies  Allergen Reactions   Acetaminophen Nausea And Vomiting   Ibuprofen Other (See Comments)    Reports causes bleeding   Gabapentin Rash   Morphine Itching and Rash   Morphine And Related Itching   Zanaflex  [Tizanidine] Rash    Antimicrobials this admission: Valacyclovir 8/16>>8/17 Ceftriaxone/metronidazole 8/16>8/17 Zosyn 8/16 >>8/17 Levaquin x 1 8/20 Anidulafungin 8/17 >>8/24  Meropenem (day  18) 8/17>>8/25 1 g q12H 8/25>>8/27 500 mg q24H 8/27>>8/28 500 mg q12H 8/28>>8/30 500 mg q8H 8/30>>        500 mg q12H  Thank you for allowing pharmacy to be a part of this patients care.  Benn Moulder, PharmD Pharmacy Resident  05/02/2020 10:40 AM

## 2020-05-02 NOTE — Progress Notes (Signed)
[facility]  ROUNDING NOTE   Subjective:   Patient was seen while receiving hemodialysis treatment, tolerating treatment well. No acute distress. Patient still has intermittent low grade temperature.   HEMODIALYSIS FLOWSHEET:  Blood Flow Rate (mL/min): 250 mL/min Arterial Pressure (mmHg): -210 mmHg Venous Pressure (mmHg): 220 mmHg Transmembrane Pressure (mmHg): 70 mmHg Ultrafiltration Rate (mL/min): 440 mL/min Dialysate Flow Rate (mL/min): 300 ml/min Conductivity: Machine : 13.9 Conductivity: Machine : 13.9 Dialysis Fluid Bolus: Normal Saline Bolus Amount (mL): 200 mL    Objective:  Vital signs in last 24 hours:  Temp:  [98.2 F (36.8 C)-100.8 F (38.2 C)] 99.7 F (37.6 C) (09/03 1433) Pulse Rate:  [97-119] 119 (09/03 1433) Resp:  [19-27] 20 (09/03 1433) BP: (92-119)/(47-65) 109/61 (09/03 1433) SpO2:  [95 %-100 %] 99 % (09/03 1433) Weight:  [135.2 kg] 135.2 kg (09/03 0358)  Weight change:  Filed Weights   04/26/20 0458 04/29/20 0500 05/02/20 0358  Weight: 131.3 kg 134.4 kg 135.2 kg    Intake/Output: I/O last 3 completed shifts: In: 1270 [P.O.:360; I.V.:50; Blood:420; NG/GT:240; IV Piggyback:200] Out: 30 [Drains:30]   Intake/Output this shift:  No intake/output data recorded.  Physical Exam: General: Resting in bed,receiving dialysis treatment,appears lethargic  Head: Atraumatic,Normocephalic  Eyes: Sclerae and conjunctivae clear  Neck: Supple, trachea at the midline  Lungs:  diminished at the bases  Heart: S1S2, no rub or gallop  Abdomen:  Obese, nontender  Extremities:  Trace peripheral edema.  Neurologic: Alert, lethargic,opens eyes to call  Skin: No acute rashes or  lesions  Access: RIJ permcath 8/27 Dr. [name]    Basic Metabolic Panel: Recent Labs  Lab 04/28/20 4193 04/28/20 7902 04/29/20 0457 04/29/20 0457 04/30/20 0827 05/01/20 0740 05/02/20 0945  NA 134*  --  134*  --  137 134* 134*  K 4.5  --  4.6  --  5.9* 4.1 4.2  CL 98   --  98  --  101 96* 97*  CO2 24  --  24  --  15* 25 24  GLUCOSE 107*  --  78  --  64* 73 76  BUN 77*  --  84*  --  91* 55* 67*  CREATININE 3.86*  --  4.62*  --  5.23* 3.93* 4.71*  CALCIUM 7.6*   < > 7.6*   < > 7.3* 7.6* 7.8*  MG 1.7  --  1.8  --  1.7 1.7 1.8  PHOS 6.1*  --  6.9*  --  8.5* 6.2* 7.0*   < > = values in this interval not displayed.    Liver Function Tests: Recent Labs  Lab 05/01/20 0705 05/02/20 0945  AST 28  --   ALT 17  --   ALKPHOS 97  --   BILITOT 1.4*  --   PROT 5.3*  --   ALBUMIN 1.4* 1.4*   No results for input(s): LIPASE, AMYLASE in the last 168 hours. No results for input(s): AMMONIA in the last 168 hours.  CBC: Recent Labs  Lab 04/28/20 0622 04/29/20 0457 04/30/20 0827 05/01/20 0830 05/02/20 0945  WBC 33.1* 24.2* 29.8* 27.4* 23.1*  NEUTROABS  --   --   --  25.1* 21.6*  HGB 9.0* 8.3* 8.4* 6.9* 7.2*  HCT 27.2* 24.4* 22.9* 19.3* 21.4*  MCV 92.5 91.0 86.7 88.1 90.7  PLT 193 165 135* 151 152    Cardiac Enzymes: No results for input(s): CKTOTAL, CKMB, CKMBINDEX, TROPONINI in the last 168 hours.  BNP: Invalid input(s): POCBNP  CBG: No results for input(s): GLUCAP in the last 168 hours.  Microbiology: Results for orders placed or performed during the hospital encounter of 04/14/20  SARS Coronavirus 2 by RT PCR (hospital order, performed in [facility] hospital lab) Nasopharyngeal Nasopharyngeal Swab     Status: None   Collection Time: 04/14/20  2:17 AM   Specimen: Nasopharyngeal Swab  Result Value Ref Range Status   SARS Coronavirus 2 NEGATIVE NEGATIVE Final    Comment: (NOTE) SARS-CoV-2 target nucleic acids are NOT DETECTED.  The SARS-CoV-2 RNA is generally detectable in upper and lower respiratory specimens during the acute phase of infection. The lowest concentration of SARS-CoV-2 viral copies this assay can detect is 250 copies / mL. A negative result does not preclude SARS-CoV-2 infection and should not be used as the sole basis for  treatment or other patient management decisions.  A negative result may occur with improper specimen collection / handling, submission of specimen other than nasopharyngeal swab, presence of viral mutation(s) within the areas targeted by this assay, and inadequate number of viral copies (<250 copies / mL). A negative result must be combined with clinical observations, patient history, and epidemiological information.  Fact Sheet for Patients:   StrictlyIdeas.no  Fact Sheet for Healthcare Providers: BankingDealers.co.za  This test is not yet approved or  cleared by the [place] FDA and has been authorized for detection and/or diagnosis of SARS-CoV-2 by FDA under an Emergency Use Authorization (EUA).  This EUA will remain in effect (meaning this test can be used) for the duration of the COVID-19 declaration under Section 564(b)(1) of the Act, 21 U.S.C. section 360bbb-3(b)(1), unless the authorization is terminated or revoked sooner.  Performed at [facility], [address] [place], [place] [address]   CULTURE, BLOOD (ROUTINE X 2) w Reflex to ID Panel     Status: None   Collection Time: 04/14/20  8:14 AM   Specimen: BLOOD  Result Value Ref Range Status   Specimen Description BLOOD LEFT [facility]  Final   Special Requests   Final    BOTTLES DRAWN AEROBIC AND ANAEROBIC Blood Culture adequate volume   Culture   Final    NO GROWTH 5 DAYS Performed at [facility], [address] [place], [place] [address]    Report Status 04/19/2020 FINAL  Final  CULTURE, BLOOD (ROUTINE X 2) w Reflex to ID Panel     Status: None   Collection Time: 04/14/20  8:14 AM   Specimen: BLOOD  Result Value Ref Range Status   Specimen Description BLOOD LEFT WRIST  Final   Special Requests   Final    BOTTLES DRAWN AEROBIC AND ANAEROBIC Blood Culture adequate volume   Culture   Final    NO GROWTH 5 DAYS Performed at [facility],  [address], [place], [place] [address]    Report Status 04/19/2020 FINAL  Final  Aerobic/Anaerobic Culture (surgical/deep wound)     Status: None   Collection Time: 04/14/20 11:14 AM   Specimen: Abscess  Result Value Ref Range Status   Specimen Description   Final    ABSCESS Performed at [facility], [address], [place], [place] [address]    Special Requests   Final    NONE Performed at [facility], [address] [place], [place] [address]    Gram Stain   Final    NO WBC SEEN ABUNDANT GRAM NEGATIVE RODS FEW GRAM POSITIVE COCCI FEW GRAM POSITIVE RODS Performed at  [facility], [place] [address], [place], [place] [address]    Culture   Final    ABUNDANT KLEBSIELLA PNEUMONIAE ABUNDANT PSEUDOMONAS AERUGINOSA MODERATE ESCHERICHIA COLI Confirmed Extended Spectrum Beta-Lactamase Producer (ESBL).  In bloodstream infections from ESBL organisms, carbapenems are preferred over piperacillin/tazobactam. They are shown to have a lower risk of mortality. MIXED ANAEROBIC FLORA PRESENT.  CALL LAB IF FURTHER IID REQUIRED.    Report Status 04/20/2020 FINAL  Final   Organism ID, Bacteria KLEBSIELLA PNEUMONIAE  Final   Organism ID, Bacteria PSEUDOMONAS AERUGINOSA  Final   Organism ID, Bacteria ESCHERICHIA COLI  Final      Susceptibility   Escherichia coli - MIC*    AMPICILLIN >=32 RESISTANT Resistant     CEFAZOLIN >=64 RESISTANT Resistant     CEFEPIME 16 RESISTANT Resistant     CEFTAZIDIME RESISTANT Resistant     CEFTRIAXONE >=64 RESISTANT Resistant     CIPROFLOXACIN >=4 RESISTANT Resistant     GENTAMICIN <=1 SENSITIVE Sensitive     IMIPENEM <=0.25 SENSITIVE Sensitive     TRIMETH/SULFA >=320 RESISTANT Resistant     AMPICILLIN/SULBACTAM >=32 RESISTANT Resistant     PIP/TAZO 8 SENSITIVE Sensitive     * MODERATE ESCHERICHIA COLI   Klebsiella pneumoniae - MIC*    AMPICILLIN >=32 RESISTANT Resistant     CEFAZOLIN <=4 SENSITIVE Sensitive     CEFEPIME <=0.12  SENSITIVE Sensitive     CEFTAZIDIME <=1 SENSITIVE Sensitive     CEFTRIAXONE <=0.25 SENSITIVE Sensitive     CIPROFLOXACIN <=0.25 SENSITIVE Sensitive     GENTAMICIN <=1 SENSITIVE Sensitive     IMIPENEM <=0.25 SENSITIVE Sensitive     TRIMETH/SULFA <=20 SENSITIVE Sensitive     AMPICILLIN/SULBACTAM 16 INTERMEDIATE Intermediate     PIP/TAZO 8 SENSITIVE Sensitive     * ABUNDANT KLEBSIELLA PNEUMONIAE   Pseudomonas aeruginosa - MIC*    CEFTAZIDIME 4 SENSITIVE Sensitive     CIPROFLOXACIN <=0.25 SENSITIVE Sensitive     GENTAMICIN 2 SENSITIVE Sensitive     IMIPENEM 2 SENSITIVE Sensitive     PIP/TAZO 8 SENSITIVE Sensitive     CEFEPIME 4 SENSITIVE Sensitive     * ABUNDANT PSEUDOMONAS AERUGINOSA  MRSA PCR Screening     Status: None   Collection Time: 04/14/20  6:19 PM   Specimen: Nasopharyngeal  Result Value Ref Range Status   MRSA by PCR NEGATIVE NEGATIVE Final    Comment:        The GeneXpert MRSA Assay (FDA approved for NASAL specimens only), is one component of a comprehensive MRSA colonization surveillance program. It is not intended to diagnose MRSA infection nor to guide or monitor treatment for MRSA infections. Performed at [facility], [address] [place], [place] [address]   Urine Culture     Status: None   Collection Time: 04/15/20  4:28 AM   Specimen: Urine, Random  Result Value Ref Range Status   Specimen Description   Final    URINE, RANDOM Performed at [facility], [address] [place], [place] [address]    Special Requests   Final    NONE Performed at [facility], [address], [place], [place] [address]    Culture   Final    NO GROWTH Performed at [facility], [place] [address] [place], [place] [address]    Report Status 04/16/2020 FINAL  Final  CULTURE, BLOOD (ROUTINE X 2) w Reflex to ID Panel     Status: None  Collection Time: 04/22/20  6:12 PM   Specimen: BLOOD  Result Value Ref Range Status   Specimen  Description BLOOD FOOT  Final   Special Requests   Final    BOTTLES DRAWN AEROBIC AND ANAEROBIC Blood Culture adequate volume   Culture   Final    NO GROWTH 5 DAYS Performed at [facility], [address] [place], [place] [address]    Report Status 04/27/2020 FINAL  Final  CULTURE, BLOOD (ROUTINE X 2) w Reflex to ID Panel     Status: None   Collection Time: 04/22/20  6:17 PM   Specimen: BLOOD  Result Value Ref Range Status   Specimen Description BLOOD FOOT  Final   Special Requests   Final    BOTTLES DRAWN AEROBIC AND ANAEROBIC Blood Culture results may not be optimal due to an excessive volume of blood received in culture bottles   Culture   Final    NO GROWTH 5 DAYS Performed at [facility], [address] [place], [place] [address]    Report Status 04/27/2020 FINAL  Final  CULTURE, BLOOD (ROUTINE X 2) w Reflex to ID Panel     Status: None (Preliminary result)   Collection Time: 05/01/20  2:50 PM   Specimen: BLOOD  Result Value Ref Range Status   Specimen Description BLOOD BLOOD LEFT HAND  Final   Special Requests   Final    BOTTLES DRAWN AEROBIC AND ANAEROBIC Blood Culture adequate volume   Culture   Final    NO GROWTH < 12 HOURS Performed at [facility], [address] [place], [place] [address]    Report Status PENDING  Incomplete  CULTURE, BLOOD (ROUTINE X 2) w Reflex to ID Panel     Status: None (Preliminary result)   Collection Time: 05/01/20  2:51 PM   Specimen: BLOOD  Result Value Ref Range Status   Specimen Description BLOOD BLOOD RIGHT HAND  Final   Special Requests   Final    BOTTLES DRAWN AEROBIC AND ANAEROBIC Blood Culture adequate volume   Culture   Final    NO GROWTH < 12 HOURS Performed at [facility], [address] [place], [place] [address]    Report Status PENDING  Incomplete    Coagulation Studies: No results for input(s): LABPROT, INR in the last 72 hours.  Urinalysis: No results for input(s):  COLORURINE, LABSPEC, PHURINE, GLUCOSEU, HGBUR, BILIRUBINUR, KETONESUR, PROTEINUR, UROBILINOGEN, NITRITE, LEUKOCYTESUR in the last 72 hours.  Invalid input(s): APPERANCEUR    Imaging: No results found.   Medications:   . sodium chloride Stopped (05/01/20 0900)  . meropenem (MERREM) IV 500 mg (05/02/20 1417)   . (feeding supplement) PROSource Plus  30 mL Oral TID BM  . acidophilus  1 capsule Oral Daily  . alteplase  2 mg Intracatheter Once  . alteplase  2 mg Intracatheter Once  . amiodarone  200 mg Oral BID  . Chlorhexidine Gluconate Cloth  6 each Topical Daily  . docusate sodium  200 mg Oral BID  . rilpivirine  25 mg Oral Q breakfast   And  . dolutegravir  50 mg Oral Q breakfast  . famotidine  10 mg Oral Q1200  . feeding supplement (NEPRO CARB STEADY)  237 mL Oral TID BM  . guaiFENesin-dextromethorphan  10 mL Oral Q8H  . heparin injection (subcutaneous)  5,000 Units Subcutaneous Q8H  . mometasone-formoterol  2 puff Inhalation BID  . multivitamin  1 tablet Oral QHS  .  pregabalin  25 mg Oral Daily  . sodium chloride flush  5 mL Intracatheter Q8H  . sodium zirconium cyclosilicate  10 g Oral Daily   acetaminophen **OR** acetaminophen, albuterol, ALPRAZolam, bisacodyl, diphenhydrAMINE, heparin NICU/SCN flush, HYDROmorphone (DILAUDID) injection, iohexol, ipratropium-albuterol, labetalol, magic mouthwash **AND** lidocaine, magnesium hydroxide, menthol-cetylpyridinium, oxyCODONE-acetaminophen, phenol, simethicone  Assessment/ Plan:  Ms. SAMANDA BUSKE is a 59 y.o. black female with HIV, COPD, hypertension, GERD, who is admitted to [facility] on 04/14/2020 for Dyspnea [R06.00] Colonic diverticular abscess [K57.20] Diverticulitis of large intestine with abscess without bleeding [K57.20] Abscess of sigmoid colon due to diverticulitis [K57.20]  #Acute renal failure with hyperkalemia: No history of chronic kidney disease. History of bland urine.  Requiring hemodialysis. First treatment on  8/18. Seen and examined on hemodialysis.  IV contrast exposure on 8/16. Ultrasound reviewed, no evidence of HIV induced nephropathy Receiving hemodialysis treatment today Will continue MWF schedule and monitor for renal recovery Cr 4.71 today  # Hypertension:  Blood pressure agents are on hold due to hypotensive episodes during dialysis. Home regimen of losartan, hydrochlorothiazide and amlodipine being held.  BP readings stay at low normal levels.  3. Anemia of kidney failure: hemoglobin 7.2 today - Plan Epogen with dialysis treatments - Low threshold for PRBC transfusion.   Patient was seen and examined with Crosby Oyster, DNP.  Seen and examined on hemodialysis. Tolerating treatment well.  For patient's anemia: Start EPO with HD treatments Outpatient chair at [facility] MWF schedule.  Above plan was discussed and agreed upon on the signing of this note.    LOS: 18 Tahtiana Rozier 9/3/20213:03 PM

## 2020-05-03 LAB — HEPATIC FUNCTION PANEL
ALT: 18 U/L (ref 0–44)
AST: 27 U/L (ref 15–41)
Albumin: 1.5 g/dL — ABNORMAL LOW (ref 3.5–5.0)
Alkaline Phosphatase: 101 U/L (ref 38–126)
Bilirubin, Direct: 0.4 mg/dL — ABNORMAL HIGH (ref 0.0–0.2)
Indirect Bilirubin: 1 mg/dL — ABNORMAL HIGH (ref 0.3–0.9)
Total Bilirubin: 1.4 mg/dL — ABNORMAL HIGH (ref 0.3–1.2)
Total Protein: 5.8 g/dL — ABNORMAL LOW (ref 6.5–8.1)

## 2020-05-03 LAB — GLUCOSE, CAPILLARY: Glucose-Capillary: 78 mg/dL (ref 70–99)

## 2020-05-03 LAB — MAGNESIUM: Magnesium: 1.6 mg/dL — ABNORMAL LOW (ref 1.7–2.4)

## 2020-05-03 MED ORDER — MAGNESIUM OXIDE 400 (241.3 MG) MG PO TABS
400.0000 mg | ORAL_TABLET | Freq: Every day | ORAL | Status: DC
  Administered 2020-05-03 – 2020-05-21 (×14): 400 mg via ORAL
  Filled 2020-05-03 (×15): qty 1

## 2020-05-03 MED ORDER — TORSEMIDE 20 MG PO TABS
40.0000 mg | ORAL_TABLET | Freq: Every day | ORAL | Status: DC
  Administered 2020-05-04 – 2020-05-22 (×13): 40 mg via ORAL
  Filled 2020-05-03 (×21): qty 2

## 2020-05-03 NOTE — Progress Notes (Signed)
[facility]  ROUNDING NOTE   Subjective:   Doing fair Eating breakfast when seen Able to answer few simple questions appropriately   Objective:  Vital signs in last 24 hours:  Temp:  [98.3 F (36.8 C)-100.4 F (38 C)] 98.6 F (37 C) (09/04 0909) Pulse Rate:  [104-119] 111 (09/04 0909) Resp:  [19-20] 19 (09/04 0909) BP: (106-121)/(61-98) 121/98 (09/04 0909) SpO2:  [93 %-100 %] 100 % (09/04 0909) Weight:  [130.9 kg] 130.9 kg (09/04 0547)  Weight change: -4.309 kg Filed Weights   04/29/20 0500 05/02/20 0358 05/03/20 0547  Weight: 134.4 kg 135.2 kg 130.9 kg    Intake/Output: I/O last 3 completed shifts: In: 70 [Blood:420] Out: 32.5 [Urine:1; Drains:30.5; Stool:1]   Intake/Output this shift:  No intake/output data recorded.  Physical Exam: General: Resting in bed,  Lungs:  diminished at the bases  Heart: S1S2, no rub or gallop  Abdomen:  Obese, nontender  Extremities:  Trace peripheral edema.  Neurologic:  Lethargic but able to answer simple questions  Skin  warm, dry  Access: RIJ permcath 8/27 Dr. [name]    Basic Metabolic Panel: Recent Labs  Lab 04/28/20 1497 04/28/20 0263 04/29/20 0457 04/29/20 0457 04/30/20 0827 05/01/20 0740 05/02/20 0945 05/03/20 0636  NA 134*  --  134*  --  137 134* 134*  --   K 4.5  --  4.6  --  5.9* 4.1 4.2  --   CL 98  --  98  --  101 96* 97*  --   CO2 24  --  24  --  15* 25 24  --   GLUCOSE 107*  --  78  --  64* 73 76  --   BUN 77*  --  84*  --  91* 55* 67*  --   CREATININE 3.86*  --  4.62*  --  5.23* 3.93* 4.71*  --   CALCIUM 7.6*   < > 7.6*   < > 7.3* 7.6* 7.8*  --   MG 1.7   < > 1.8  --  1.7 1.7 1.8 1.6*  PHOS 6.1*  --  6.9*  --  8.5* 6.2* 7.0*  --    < > = values in this interval not displayed.    Liver Function Tests: Recent Labs  Lab 05/01/20 0705 05/02/20 0945 05/03/20 0636  AST 28  --  27  ALT 17  --  18  ALKPHOS 97  --  101  BILITOT 1.4*  --  1.4*  PROT 5.3*  --  5.8*  ALBUMIN 1.4* 1.4* 1.5*    No results for input(s): LIPASE, AMYLASE in the last 168 hours. No results for input(s): AMMONIA in the last 168 hours.  CBC: Recent Labs  Lab 04/28/20 0622 04/29/20 0457 04/30/20 0827 05/01/20 0830 05/02/20 0945  WBC 33.1* 24.2* 29.8* 27.4* 23.1*  NEUTROABS  --   --   --  25.1* 21.6*  HGB 9.0* 8.3* 8.4* 6.9* 7.2*  HCT 27.2* 24.4* 22.9* 19.3* 21.4*  MCV 92.5 91.0 86.7 88.1 90.7  PLT 193 165 135* 151 152    Cardiac Enzymes: No results for input(s): CKTOTAL, CKMB, CKMBINDEX, TROPONINI in the last 168 hours.  BNP: Invalid input(s): POCBNP  CBG: Recent Labs  Lab 05/03/20 7858  IFOYDX 41    Microbiology: Results for orders placed or performed during the hospital encounter of 04/14/20  SARS Coronavirus 2 by RT PCR (hospital order, performed in [facility] hospital lab) Nasopharyngeal Nasopharyngeal Swab  Status: None   Collection Time: 04/14/20  2:17 AM   Specimen: Nasopharyngeal Swab  Result Value Ref Range Status   SARS Coronavirus 2 NEGATIVE NEGATIVE Final    Comment: (NOTE) SARS-CoV-2 target nucleic acids are NOT DETECTED.  The SARS-CoV-2 RNA is generally detectable in upper and lower respiratory specimens during the acute phase of infection. The lowest concentration of SARS-CoV-2 viral copies this assay can detect is 250 copies / mL. A negative result does not preclude SARS-CoV-2 infection and should not be used as the sole basis for treatment or other patient management decisions.  A negative result may occur with improper specimen collection / handling, submission of specimen other than nasopharyngeal swab, presence of viral mutation(s) within the areas targeted by this assay, and inadequate number of viral copies (<250 copies / mL). A negative result must be combined with clinical observations, patient history, and epidemiological information.  Fact Sheet for Patients:   StrictlyIdeas.no  Fact Sheet for Healthcare  Providers: BankingDealers.co.za  This test is not yet approved or  cleared by the [place] FDA and has been authorized for detection and/or diagnosis of SARS-CoV-2 by FDA under an Emergency Use Authorization (EUA).  This EUA will remain in effect (meaning this test can be used) for the duration of the COVID-19 declaration under Section 564(b)(1) of the Act, 21 U.S.C. section 360bbb-3(b)(1), unless the authorization is terminated or revoked sooner.  Performed at [facility], [address] [place], [place] [address]   CULTURE, BLOOD (ROUTINE X 2) w Reflex to ID Panel     Status: None   Collection Time: 04/14/20  8:14 AM   Specimen: BLOOD  Result Value Ref Range Status   Specimen Description BLOOD LEFT [facility]  Final   Special Requests   Final    BOTTLES DRAWN AEROBIC AND ANAEROBIC Blood Culture adequate volume   Culture   Final    NO GROWTH 5 DAYS Performed at [facility], [address] [place], [place] [address]    Report Status 04/19/2020 FINAL  Final  CULTURE, BLOOD (ROUTINE X 2) w Reflex to ID Panel     Status: None   Collection Time: 04/14/20  8:14 AM   Specimen: BLOOD  Result Value Ref Range Status   Specimen Description BLOOD LEFT WRIST  Final   Special Requests   Final    BOTTLES DRAWN AEROBIC AND ANAEROBIC Blood Culture adequate volume   Culture   Final    NO GROWTH 5 DAYS Performed at [facility], [address], [place], [place] [address]    Report Status 04/19/2020 FINAL  Final  Aerobic/Anaerobic Culture (surgical/deep wound)     Status: None   Collection Time: 04/14/20 11:14 AM   Specimen: Abscess  Result Value Ref Range Status   Specimen Description   Final    ABSCESS Performed at [facility], [address], [place], [place] [address]    Special Requests   Final    NONE Performed at [facility], [place], [place] [address]    Gram Stain   Final    NO WBC  SEEN ABUNDANT GRAM NEGATIVE RODS FEW GRAM POSITIVE COCCI FEW GRAM POSITIVE RODS Performed at [facility], [place] [address], [place], [place] [address]    Culture   Final    ABUNDANT KLEBSIELLA PNEUMONIAE ABUNDANT PSEUDOMONAS AERUGINOSA MODERATE ESCHERICHIA COLI Confirmed Extended Spectrum Beta-Lactamase Producer (ESBL).  In bloodstream infections from ESBL organisms, carbapenems are preferred over  piperacillin/tazobactam. They are shown to have a lower risk of mortality. MIXED ANAEROBIC FLORA PRESENT.  CALL LAB IF FURTHER IID REQUIRED.    Report Status 04/20/2020 FINAL  Final   Organism ID, Bacteria KLEBSIELLA PNEUMONIAE  Final   Organism ID, Bacteria PSEUDOMONAS AERUGINOSA  Final   Organism ID, Bacteria ESCHERICHIA COLI  Final      Susceptibility   Escherichia coli - MIC*    AMPICILLIN >=32 RESISTANT Resistant     CEFAZOLIN >=64 RESISTANT Resistant     CEFEPIME 16 RESISTANT Resistant     CEFTAZIDIME RESISTANT Resistant     CEFTRIAXONE >=64 RESISTANT Resistant     CIPROFLOXACIN >=4 RESISTANT Resistant     GENTAMICIN <=1 SENSITIVE Sensitive     IMIPENEM <=0.25 SENSITIVE Sensitive     TRIMETH/SULFA >=320 RESISTANT Resistant     AMPICILLIN/SULBACTAM >=32 RESISTANT Resistant     PIP/TAZO 8 SENSITIVE Sensitive     * MODERATE ESCHERICHIA COLI   Klebsiella pneumoniae - MIC*    AMPICILLIN >=32 RESISTANT Resistant     CEFAZOLIN <=4 SENSITIVE Sensitive     CEFEPIME <=0.12 SENSITIVE Sensitive     CEFTAZIDIME <=1 SENSITIVE Sensitive     CEFTRIAXONE <=0.25 SENSITIVE Sensitive     CIPROFLOXACIN <=0.25 SENSITIVE Sensitive     GENTAMICIN <=1 SENSITIVE Sensitive     IMIPENEM <=0.25 SENSITIVE Sensitive     TRIMETH/SULFA <=20 SENSITIVE Sensitive     AMPICILLIN/SULBACTAM 16 INTERMEDIATE Intermediate     PIP/TAZO 8 SENSITIVE Sensitive     * ABUNDANT KLEBSIELLA PNEUMONIAE   Pseudomonas aeruginosa - MIC*    CEFTAZIDIME 4 SENSITIVE Sensitive     CIPROFLOXACIN <=0.25 SENSITIVE Sensitive      GENTAMICIN 2 SENSITIVE Sensitive     IMIPENEM 2 SENSITIVE Sensitive     PIP/TAZO 8 SENSITIVE Sensitive     CEFEPIME 4 SENSITIVE Sensitive     * ABUNDANT PSEUDOMONAS AERUGINOSA  MRSA PCR Screening     Status: None   Collection Time: 04/14/20  6:19 PM   Specimen: Nasopharyngeal  Result Value Ref Range Status   MRSA by PCR NEGATIVE NEGATIVE Final    Comment:        The GeneXpert MRSA Assay (FDA approved for NASAL specimens only), is one component of a comprehensive MRSA colonization surveillance program. It is not intended to diagnose MRSA infection nor to guide or monitor treatment for MRSA infections. Performed at [facility], [address], [place], [place] [address]   Urine Culture     Status: None   Collection Time: 04/15/20  4:28 AM   Specimen: Urine, Random  Result Value Ref Range Status   Specimen Description   Final    URINE, RANDOM Performed at [facility], [address] [place], [place] [address]    Special Requests   Final    NONE Performed at [facility], [address], [place], [place] [address]    Culture   Final    NO GROWTH Performed at [facility], [place] [address], [place], [place] [address]    Report Status 04/16/2020 FINAL  Final  CULTURE, BLOOD (ROUTINE X 2) w Reflex to ID Panel     Status: None   Collection Time: 04/22/20  6:12 PM   Specimen: BLOOD  Result Value Ref Range Status   Specimen Description BLOOD FOOT  Final   Special Requests   Final    BOTTLES DRAWN AEROBIC AND ANAEROBIC Blood Culture adequate volume   Culture  Final    NO GROWTH 5 DAYS Performed at [facility], [address] [place], [place] [address]    Report Status 04/27/2020 FINAL  Final  CULTURE, BLOOD (ROUTINE X 2) w Reflex to ID Panel     Status: None   Collection Time: 04/22/20  6:17 PM   Specimen: BLOOD  Result Value Ref Range Status   Specimen Description BLOOD FOOT  Final   Special Requests   Final    BOTTLES  DRAWN AEROBIC AND ANAEROBIC Blood Culture results may not be optimal due to an excessive volume of blood received in culture bottles   Culture   Final    NO GROWTH 5 DAYS Performed at [facility], [address] [place], [place] [address]    Report Status 04/27/2020 FINAL  Final  CULTURE, BLOOD (ROUTINE X 2) w Reflex to ID Panel     Status: None (Preliminary result)   Collection Time: 05/01/20  2:50 PM   Specimen: BLOOD  Result Value Ref Range Status   Specimen Description BLOOD BLOOD LEFT HAND  Final   Special Requests   Final    BOTTLES DRAWN AEROBIC AND ANAEROBIC Blood Culture adequate volume   Culture   Final    NO GROWTH 2 DAYS Performed at [facility], [address] [place], [place] [address]    Report Status PENDING  Incomplete  CULTURE, BLOOD (ROUTINE X 2) w Reflex to ID Panel     Status: None (Preliminary result)   Collection Time: 05/01/20  2:51 PM   Specimen: BLOOD  Result Value Ref Range Status   Specimen Description BLOOD BLOOD RIGHT HAND  Final   Special Requests   Final    BOTTLES DRAWN AEROBIC AND ANAEROBIC Blood Culture adequate volume   Culture   Final    NO GROWTH 2 DAYS Performed at [facility], [address] [place], [place] [address]    Report Status PENDING  Incomplete    Coagulation Studies: No results for input(s): LABPROT, INR in the last 72 hours.  Urinalysis: No results for input(s): COLORURINE, LABSPEC, PHURINE, GLUCOSEU, HGBUR, BILIRUBINUR, KETONESUR, PROTEINUR, UROBILINOGEN, NITRITE, LEUKOCYTESUR in the last 72 hours.  Invalid input(s): APPERANCEUR    Imaging: CT HEAD WO CONTRAST  Result Date: 05/02/2020 CLINICAL DATA:  Delirium. Increased lethargy and confusion following dialysis. EXAM: CT HEAD WITHOUT CONTRAST TECHNIQUE: Contiguous axial images were obtained from the base of the skull through the vertex without intravenous contrast. COMPARISON:  06/02/2019 FINDINGS: Brain: There is no evidence of an  acute infarct, intracranial hemorrhage, mass, midline shift, or extra-axial fluid collection. Mild chronic small vessel ischemic changes are again noted including a chronic lacunar infarct in the left caudate head. A lacunar infarct in the right caudate head is new but also chronic in appearance. The ventricles and sulci are normal. Vascular: Calcified atherosclerosis at the skull base. No hyperdense vessel. Skull: No fracture or suspicious osseous lesion. Sinuses/Orbits: Mild mucosal thickening in the paranasal sinuses with partially visualized small volume fluid in the right maxillary sinus. Clear mastoid air cells. Unremarkable orbits. Other: None. IMPRESSION: 1. No evidence of acute intracranial abnormality. 2. Mild chronic small vessel ischemia with chronic bilateral basal ganglia lacunar infarcts. Electronically Signed   By: Logan Bores M.D.   On: 05/02/2020 17:11     Medications:   . sodium chloride 250 mL (05/03/20 0043)  . anidulafungin    . meropenem (MERREM) IV 500 mg (05/03/20 0046)   . (feeding supplement)  PROSource Plus  30 mL Oral TID BM  . acidophilus  1 capsule Oral Daily  . alteplase  2 mg Intracatheter Once  . alteplase  2 mg Intracatheter Once  . amiodarone  200 mg Oral BID  . Chlorhexidine Gluconate Cloth  6 each Topical Daily  . docusate sodium  200 mg Oral BID  . [START ON 05/05/2020] epoetin (EPOGEN/PROCRIT) injection  10,000 Units Intravenous Q M,W,F-HD  . famotidine  10 mg Oral Q1200  . feeding supplement (NEPRO CARB STEADY)  237 mL Oral TID BM  . guaiFENesin-dextromethorphan  10 mL Oral Q8H  . heparin injection (subcutaneous)  5,000 Units Subcutaneous Q8H  . magnesium oxide  400 mg Oral Daily  . mometasone-formoterol  2 puff Inhalation BID  . multivitamin  1 tablet Oral QHS  . pregabalin  25 mg Oral Daily  . sodium chloride flush  5 mL Intracatheter Q8H  . sodium zirconium cyclosilicate  10 g Oral Daily   acetaminophen **OR** acetaminophen, albuterol,  ALPRAZolam, bisacodyl, diphenhydrAMINE, heparin NICU/SCN flush, HYDROmorphone (DILAUDID) injection, iohexol, ipratropium-albuterol, labetalol, magic mouthwash **AND** lidocaine, magnesium hydroxide, menthol-cetylpyridinium, oxyCODONE-acetaminophen, phenol, simethicone  Assessment/ Plan:  Ms. MERLA SAWKA is a 59 y.o. black female with HIV, COPD, hypertension, GERD, who is admitted to [facility] on 04/14/2020 for Dyspnea [R06.00] Colonic diverticular abscess [K57.20] Diverticulitis of large intestine with abscess without bleeding [K57.20] Abscess of sigmoid colon due to diverticulitis [K57.20]  #Acute renal failure with hyperkalemia: No history of chronic kidney disease. History of bland urine.  Requiring hemodialysis. First treatment on 8/18.  IV contrast exposure on 8/16. Ultrasound -negative for obstruction or mass.   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    #. Anemia of kidney failure:  Lab Results  Component Value Date   HGB 7.2 (L) 05/02/2020    -Continue Epogen with dialysis treatments - Low threshold for PRBC transfusion.   #Lower extremity edema Start oral torsemide    LOS: [place] 9/4/202112:16 PM

## 2020-05-03 NOTE — Progress Notes (Signed)
[facility] SURGICAL PROGRESS NOTE (cpt [phone])  Hospital Day(s): 19.   Post op day(s): 8 Days Post-Op.   Interval History: Patient seen and examined, no acute events or new complaints overnight. Patient literally slept through my examination, I was unable to arouse her, though I understand this is typical.  Review of Systems:  Not obtained.  Vital signs in last 24 hours: [min-max] current  Temp:  [98.3 F (36.8 C)-99.6 F (37.6 C)] 99.6 F (37.6 C) (09/04 1223) Pulse Rate:  [104-114] 113 (09/04 1223) Resp:  [19-20] 20 (09/04 1223) BP: (112-128)/(62-98) 128/67 (09/04 1223) SpO2:  [93 %-100 %] 95 % (09/04 1223) Weight:  [130.9 kg] 130.9 kg (09/04 0547)     Height: 5\' 6"  (167.6 cm) Weight: 130.9 kg BMI (Calculated): 46.59   Intake/Output last 2 shifts:  09/03 0701 - 09/04 0700 In: -  Out: 2.5 [Urine:1; Drains:0.5; Stool:1]   Physical Exam:  Constitutional: Markedly somnolent and no distress  HENT: normocephalic without obvious abnormality  Respiratory: breathing non-labored, her baseline, at rest  Cardiovascular: regular rate and sinus rhythm  Gastrointestinal: soft, non-tender, and non-distended, left lower quadrant drain intact and functioning, volume of 20 mL estimated grossly purulent with a gray-green discoloration.    Labs:  CBC Latest Ref Rng & Units 05/02/2020 05/01/2020 04/30/2020  WBC 4.0 - 10.5 K/uL 23.1(H) 27.4(H) 29.8(H)  Hemoglobin 12.0 - 15.0 g/dL 7.2(L) 6.9(L) 8.4(L)  Hematocrit 36 - 46 % 21.4(L) 19.3(L) 22.9(L)  Platelets 150 - 400 K/uL 152 151 135(L)   CMP Latest Ref Rng & Units 05/03/2020 05/02/2020 05/01/2020  Glucose 70 - 99 mg/dL - 76 73  BUN 6 - 20 mg/dL - 67(H) 55(H)  Creatinine 0.44 - 1.00 mg/dL - 4.71(H) 3.93(H)  Sodium 135 - 145 mmol/L - 134(L) 134(L)  Potassium 3.5 - 5.1 mmol/L - 4.2 4.1  Chloride 98 - 111 mmol/L - 97(L) 96(L)  CO2 22 - 32 mmol/L - 24 25  Calcium 8.9 - 10.3 mg/dL - 7.8(L) 7.6(L)  Total Protein 6.5 - 8.1 g/dL 5.8(L) -  5.3(L)  Total Bilirubin 0.3 - 1.2 mg/dL 1.4(H) - 1.4(H)  Alkaline Phos 38 - 126 U/L 101 - 97  AST 15 - 41 U/L 27 - 28  ALT 0 - 44 U/L 18 - 17     Imaging studies: Last CT 04/29/2020:  CLINICAL DATA:  Left lower quadrant abdominal pain. History of diverticulitis complicated by abscess.  EXAM: CT ABDOMEN AND PELVIS WITHOUT CONTRAST  TECHNIQUE: Multidetector CT imaging of the abdomen and pelvis was performed following the standard protocol without IV contrast.  COMPARISON:  CT abdomen pelvis dated 04/18/2020.  FINDINGS: Lower chest: There is a moderate right pleural effusion with associated atelectasis. There is mild left basilar atelectasis.  Hepatobiliary: No focal liver abnormality is seen. No gallstones, gallbladder wall thickening, or biliary dilatation.  Pancreas: Unremarkable. No pancreatic ductal dilatation or surrounding inflammatory changes.  Spleen: Normal in size without focal abnormality.  Adrenals/Urinary Tract: Adrenal glands are unremarkable. Kidneys are normal, without renal calculi, focal lesion, or hydronephrosis. Bladder is unremarkable.  Stomach/Bowel: Stomach is within normal limits. Enteric contrast reaches the rectum. Multiple mildly dilated loops of small bowel are seen in the mid abdomen without a transition point to decompressed distal bowel, similar to prior exam. Appendix appears normal. There is sigmoid diverticulosis without significant associated inflammatory changes.  Vascular/Lymphatic: Aortic atherosclerosis. No enlarged abdominal or pelvic lymph nodes.  Reproductive: Uterus and bilateral adnexa are unremarkable.  Other: There is a  moderate volume intraperitoneal fluid, increased since 04/18/2020. No definite well-defined fluid collection to suggest abscess is identified, however evaluation is limited due to the lack of intravenous contrast. A locule of gas in the lower midline abdomen (series 2, image 67) is favored to  be located within a loop of bowel. A pigtail catheter is seen in the lower abdomen, slightly retracted since prior exam. Anasarca is noted. No abdominal wall hernia.  Musculoskeletal: Spinal fusion hardware is seen from L3-L5. Degenerative changes are seen in the spine.  IMPRESSION: 1. Moderate volume intraperitoneal fluid, increased since 04/18/2020. No definite well-defined fluid collection to suggest abscess is identified, however evaluation is limited due to the lack of intravenous contrast. A pigtail catheter has been partially retracted but remains located in the lower abdomen. 2. Multiple mildly dilated loops of small bowel in the mid abdomen are similar to prior exam and likely reflect ileus. 3. Moderate right pleural effusion with associated atelectasis.  Aortic Atherosclerosis (ICD10-I70.0).   Electronically Signed   By: Zerita Boers M.D.   On: 04/29/2020 19:47   Assessment/Plan:  59 y.o. female with persistent drain localized left lower quadrant/pelvis for complicated diverticulitis with abscess, she continues to have persistently elevated white blood cell count, with changes in drain output present.  Follow-up CT scan showing no evidence of additional intra-abdominal complication.  Drain output diminishing.  Otherwise complicated by pertinent comorbidities including: Patient Active Problem List   Diagnosis Date Noted  . Dyspnea   . Acute renal failure (ARF) ([place]) 04/16/2020  . Severe sepsis with septic shock ([place]) 04/16/2020  . Hypotension 04/16/2020  . Diverticulosis 04/16/2020  . Acute respiratory distress   . DNR (do not resuscitate) discussion   . Palliative care by specialist   . Abscess of sigmoid colon due to diverticulitis 04/14/2020  . Multifocal pneumonia 11/06/2019  . AKI (acute kidney injury) ([place]) 11/06/2019  . Leukocytosis 11/06/2019  . Diverticulitis of large intestine with abscess without bleeding 01/17/2019  . Morbid (severe) obesity due  to excess calories ([place]) 11/26/2018  . Violation of controlled substance agreement 12/05/2017  . Positive urine drug screen (+cocaine) 12/05/2017  . Cervicalgia 12/05/2017  . Drug-seeking behavior 12/05/2017  . Community acquired pneumonia 11/28/2016  . Pneumonia 11/28/2016  . Allergic rhinitis 05/27/2016  . Tobacco abuse counseling 02/11/2016  . Symptomatic anemia   . Lower GI bleed   . Anemia 01/16/2016  . GI bleed 01/10/2016  . GERD (gastroesophageal reflux disease) 01/10/2016  . HTN (hypertension) 01/10/2016  . Depression 01/10/2016  . HIV (human immunodeficiency virus infection) ([place]) 01/10/2016  . Lumbar pseudoarthrosis 10/31/2015  . Chronic cough   . Cough 06/19/2015  . DDD (degenerative disc disease), lumbar 12/31/2014  . Degenerative disc disease, lumbar 12/31/2014  . Asthma, chronic 20/25/4270     -Uncertain of the yield/helpfulness of reculturing her drain fluid, but I understand the concerns of her persistent leukocytosis.  -No emergent or urgent surgical intervention under consideration at this time.  -We will continue to follow this drain peripherally, feel free to contact me should further concerns arise.  -At present we have no plans of discontinuing the drain.  If further evaluation of the drain is needed, I anticipate IR would be available to access the drain or adjust it as necessary.  All of the above recommendations were discussed with the primary service.  -- Ronny Bacon M.D., [facility] 05/03/2020 3:58 PM

## 2020-05-03 NOTE — Progress Notes (Addendum)
Progress Note    Melanie Bowen  RWE:[id number] DOB: 1961-03-03  DOA: 04/14/2020 PCP: Theotis Burrow, MD      Brief Narrative:    Medical records reviewed and are as summarized below:  Melanie Bowen is a 59 y.o. female       Assessment/Plan:   Principal Problem:   Severe sepsis with septic shock ([place]) Active Problems:   HTN (hypertension)   HIV (human immunodeficiency virus infection) ([place])   Diverticulitis of large intestine with abscess without bleeding   Abscess of sigmoid colon due to diverticulitis   Acute renal failure (ARF) ([place])   Hypotension   Diverticulosis   Acute respiratory distress   DNR (do not resuscitate) discussion   Palliative care by specialist   Dyspnea    Nutrition Problem: Increased nutrient needs Etiology: chronic illness (COPD, new HD)  Signs/Symptoms: estimated needs   Body mass index is 46.57 kg/m.  (Morbid obesity): This complicates overall care and prognosis   Septic shock - resolved  Acute sigmoid diverticulitis and diverticular abscess, significant leukocytosis- s/p CT-guided drainage by IR on 04/14/2020.  Fluid culture showed Klebsiella pneumonia, pseudomonas aeruginosa and E. coli.  No gallbladder abscess as reported previously (this was an error)  Acute kidney injury complicated by hyperkalemia and metabolic acidosis  Atrial fibrillation with RVR-converted to normal sinus rhythm  Acute anemia, likely multifactorial poor oral intake and acute illness (no evidence of bleeding thus far)  Sinus tachycardia  COPD exacerbation-resolved  HIV infection  Acute liver failure-resolved  Acute hypoxemic respiratory failure-resolved  Acute metabolic encephalopathy/delirium -recurrent.  No acute abnormality on CT head  History of stroke  PLAN  S/p transfusion with 1 unit of PRBCs on 05/01/2020.  Continue to monitor H&H and transfuse as needed. CT head did not show any acute abnormality but there was  evidence of chronic bilateral basal ganglia lacunar infarcts. Continue IV meropenem Follow-up with nephrologist for hemodialysis Continue antiretroviral therapy Continue bronchodilators Continue amiodarone.  Consideration for long-term anticoagulation in the future Turn patient every 2 hours.  Ordered low air mattress. Case was discussed with Dr. Christian Mate, general surgeon.  Drain will be kept in place until patient is ready for discharge.  He will follow the patient as needed.   Diet Order            Diet renal with fluid restriction Room service appropriate? Yes; Fluid consistency: Thin  Diet effective now                    Consultants:  General surgeon  Cardiologist  Nephrologist  Vascular surgeon  Procedures:  CT-guided drainage of diverticular abscess on 04/14/2020    Medications:   . (feeding supplement) PROSource Plus  30 mL Oral TID BM  . acidophilus  1 capsule Oral Daily  . alteplase  2 mg Intracatheter Once  . alteplase  2 mg Intracatheter Once  . amiodarone  200 mg Oral BID  . Chlorhexidine Gluconate Cloth  6 each Topical Daily  . docusate sodium  200 mg Oral BID  . [START ON 05/05/2020] epoetin (EPOGEN/PROCRIT) injection  10,000 Units Intravenous Q M,W,F-HD  . famotidine  10 mg Oral Q1200  . feeding supplement (NEPRO CARB STEADY)  237 mL Oral TID BM  . guaiFENesin-dextromethorphan  10 mL Oral Q8H  . heparin injection (subcutaneous)  5,000 Units Subcutaneous Q8H  . magnesium oxide  400 mg Oral Daily  . mometasone-formoterol  2 puff Inhalation BID  . multivitamin  1 tablet Oral QHS  . pregabalin  25 mg Oral Daily  . sodium chloride flush  5 mL Intracatheter Q8H  . sodium zirconium cyclosilicate  10 g Oral Daily  . [START ON 05/04/2020] torsemide  40 mg Oral Daily   Continuous Infusions: . sodium chloride 250 mL (05/03/20 0043)  . anidulafungin    . meropenem (MERREM) IV 500 mg (05/03/20 1233)     Anti-infectives (From admission, onward)    Start     Dose/Rate Route Frequency Ordered Stop   05/03/20 2000  anidulafungin (ERAXIS) 100 mg in sodium chloride 0.9 % 100 mL IVPB        100 mg 78 mL/hr over 100 Minutes Intravenous Every 24 hours 05/02/20 2223     05/02/20 2330  anidulafungin (ERAXIS) 200 mg in sodium chloride 0.9 % 200 mL IVPB        200 mg 78 mL/hr over 200 Minutes Intravenous  Once 05/02/20 2223 05/03/20 0455   04/30/20 0800  rilpivirine (EDURANT) tablet 25 mg  Status:  Discontinued       "And" Linked Group Details   25 mg Oral Daily with breakfast 04/29/20 1254 05/02/20 2223   04/30/20 0800  dolutegravir (TIVICAY) tablet 50 mg  Status:  Discontinued       "And" Linked Group Details   50 mg Oral Daily with breakfast 04/29/20 1254 05/02/20 2223   04/28/20 2200  meropenem (MERREM) 500 mg in sodium chloride 0.9 % 100 mL IVPB        500 mg 200 mL/hr over 30 Minutes Intravenous Every 12 hours 04/28/20 1546     04/26/20 2200  meropenem (MERREM) 500 mg in sodium chloride 0.9 % 100 mL IVPB  Status:  Discontinued        500 mg 200 mL/hr over 30 Minutes Intravenous Every 8 hours 04/26/20 1258 04/28/20 1546   04/25/20 1700  rilpivirine (EDURANT) tablet 25 mg  Status:  Discontinued        25 mg Oral Daily with breakfast 04/24/20 1636 04/25/20 1111   04/25/20 1200  rilpivirine (EDURANT) tablet 25 mg  Status:  Discontinued        25 mg Oral Daily with breakfast 04/25/20 1111 04/29/20 1254   04/25/20 1000  meropenem (MERREM) 500 mg in sodium chloride 0.9 % 100 mL IVPB  Status:  Discontinued        500 mg 200 mL/hr over 30 Minutes Intravenous Every 12 hours 04/25/20 0935 04/26/20 1258   04/24/20 1700  darunavir-cobicistat (PREZCOBIX) 800-150 MG per tablet 1 tablet  Status:  Discontinued        1 tablet Oral Daily with breakfast 04/24/20 1231 04/24/20 1636   04/24/20 1330  dolutegravir (TIVICAY) tablet 50 mg  Status:  Discontinued        50 mg Oral Daily 04/24/20 1231 04/29/20 1254   04/23/20 2200  meropenem (MERREM) 500 mg in  sodium chloride 0.9 % 100 mL IVPB  Status:  Discontinued        500 mg 200 mL/hr over 30 Minutes Intravenous Every 24 hours 04/23/20 1127 04/25/20 0935   04/18/20 2000  levofloxacin (LEVAQUIN) IVPB 750 mg        750 mg 100 mL/hr over 90 Minutes Intravenous  Once 04/18/20 1857 04/19/20 0004   04/18/20 1800  levofloxacin (LEVAQUIN) IVPB 750 mg  Status:  Discontinued        750 mg 100 mL/hr over 90 Minutes Intravenous  Once 04/18/20 1640 04/18/20 1857  04/16/20 2200  anidulafungin (ERAXIS) 100 mg in sodium chloride 0.9 % 100 mL IVPB        100 mg 78 mL/hr over 100 Minutes Intravenous Every 24 hours 04/16/20 1707 04/22/20 0211   04/15/20 2200  valACYclovir (VALTREX) tablet 500 mg  Status:  Discontinued        500 mg Oral Daily at bedtime 04/15/20 1248 04/16/20 1259   04/15/20 2200  anidulafungin (ERAXIS) 200 mg in sodium chloride 0.9 % 200 mL IVPB        200 mg 78 mL/hr over 200 Minutes Intravenous  Once 04/15/20 2008 04/16/20 0327   04/15/20 2200  meropenem (MERREM) 1 g in sodium chloride 0.9 % 100 mL IVPB  Status:  Discontinued        1 g 200 mL/hr over 30 Minutes Intravenous Every 12 hours 04/15/20 2017 04/23/20 1127   04/15/20 1400  piperacillin-tazobactam (ZOSYN) IVPB 3.375 g  Status:  Discontinued        3.375 g 12.5 mL/hr over 240 Minutes Intravenous Every 8 hours 04/15/20 1300 04/15/20 2006   04/14/20 2200  elvitegravir-cobicistat-emtricitabine-tenofovir (GENVOYA) 150-150-200-10 MG tablet 1 tablet  Status:  Discontinued        1 tablet Oral Daily at bedtime 04/14/20 0235 04/15/20 0957   04/14/20 2200  valACYclovir (VALTREX) tablet 1,000 mg  Status:  Discontinued        1,000 mg Oral Daily at bedtime 04/14/20 0235 04/15/20 1248   04/14/20 1230  cefTRIAXone (ROCEPHIN) 2 g in sodium chloride 0.9 % 100 mL IVPB  Status:  Discontinued        2 g 200 mL/hr over 30 Minutes Intravenous Every 24 hours 04/14/20 1223 04/15/20 1300   04/14/20 1230  metroNIDAZOLE (FLAGYL) IVPB 500 mg  Status:   Discontinued        500 mg 100 mL/hr over 60 Minutes Intravenous Every 8 hours 04/14/20 1223 04/15/20 1636   04/14/20 1030  piperacillin-tazobactam (ZOSYN) IVPB 3.375 g  Status:  Discontinued        3.375 g 12.5 mL/hr over 240 Minutes Intravenous Every 8 hours 04/14/20 0235 04/14/20 1223   04/14/20 0215  piperacillin-tazobactam (ZOSYN) IVPB 4.5 g  Status:  Discontinued        4.5 g 200 mL/hr over 30 Minutes Intravenous  Once 04/14/20 0200 04/14/20 0208   04/14/20 0215  piperacillin-tazobactam (ZOSYN) IVPB 3.375 g        3.375 g 100 mL/hr over 30 Minutes Intravenous  Once 04/14/20 0208 04/14/20 0301             Family Communication/Anticipated D/C date and plan/Code Status   DVT prophylaxis: heparin injection 5,000 Units Start: 04/23/20 0800 because of high risk for DVT     Code Status: Partial Code  Family Communication: Plan discussed with patient Disposition Plan:    Status is: Inpatient  Remains inpatient appropriate because:Inpatient level of care appropriate due to severity of illness and Requiring hemodialysis for acute kidney injury   Dispo: The patient is from: Home              Anticipated d/c is to: SNF              Anticipated d/c date is: > 3 days              Patient currently is not medically stable to d/c.           Subjective:   She complains of pain in the left lower quadrant  and buttocks.  She said pain is better today.  Objective:    Vitals:   05/02/20 2008 05/03/20 0547 05/03/20 0909 05/03/20 1223  BP: 114/62 112/65 (!) 121/98 128/67  Pulse: (!) 114 (!) 104 (!) 111 (!) 113  Resp: 20 19 19 20   Temp: 98.6 F (37 C) 98.4 F (36.9 C) 98.6 F (37 C) 99.6 F (37.6 C)  TempSrc: Oral Oral Oral Oral  SpO2: 93% 98% 100% 95%  Weight:  130.9 kg    Height:       No data found.   Intake/Output Summary (Last 24 hours) at 05/03/2020 1409 Last data filed at 05/03/2020 0026 Gross per 24 hour  Intake --  Output 0.5 ml  Net -0.5 ml    Filed Weights   04/29/20 0500 05/02/20 0358 05/03/20 0547  Weight: 134.4 kg 135.2 kg 130.9 kg    Exam:  GEN: No acute distress SKIN: Warm and dry.  She has raw areas on bilateral buttocks and bilateral upper thighs (moisture associated skin damage).  Excoriations noted under bilateral breasts and skin folds in lower abdominal/inguinal regions. EYES: Pale but anicteric ENT: Moist mucous membranes CV: Tachycardic.  No murmurs rubs or gallops. PULM: Bilateral expiratory wheezing.  No rales heard ABD: Soft, nontender, obese/distended CNS: alert, oriented to person and place.  No focal deficits noted EXT: Bilateral leg edema (2+).  No tenderness or erythema    Ashly, RN at the bedside   Data Reviewed:   I have personally reviewed following labs and imaging studies:  Labs: Labs show the following:   Basic Metabolic Panel: Recent Labs  Lab 04/28/20 0640 04/28/20 0640 04/29/20 0457 04/29/20 0457 04/30/20 0827 04/30/20 0827 05/01/20 0740 05/02/20 0945 05/03/20 0636  NA 134*  --  134*  --  137  --  134* 134*  --   K 4.5   < > 4.6   < > 5.9*   < > 4.1 4.2  --   CL 98  --  98  --  101  --  96* 97*  --   CO2 24  --  24  --  15*  --  25 24  --   GLUCOSE 107*  --  78  --  64*  --  73 76  --   BUN 77*  --  84*  --  91*  --  55* 67*  --   CREATININE 3.86*  --  4.62*  --  5.23*  --  3.93* 4.71*  --   CALCIUM 7.6*  --  7.6*  --  7.3*  --  7.6* 7.8*  --   MG 1.7   < > 1.8  --  1.7  --  1.7 1.8 1.6*  PHOS 6.1*  --  6.9*  --  8.5*  --  6.2* 7.0*  --    < > = values in this interval not displayed.   GFR Estimated Creatinine Clearance: 18.1 mL/min (A) (by C-G formula based on SCr of 4.71 mg/dL (H)). Liver Function Tests: Recent Labs  Lab 05/01/20 0705 05/02/20 0945 05/03/20 0636  AST 28  --  27  ALT 17  --  18  ALKPHOS 97  --  101  BILITOT 1.4*  --  1.4*  PROT 5.3*  --  5.8*  ALBUMIN 1.4* 1.4* 1.5*   No results for input(s): LIPASE, AMYLASE in the last 168 hours. No  results for input(s): AMMONIA in the last 168 hours. Coagulation profile No results for  input(s): INR, PROTIME in the last 168 hours.  CBC: Recent Labs  Lab 04/28/20 0622 04/29/20 0457 04/30/20 0827 05/01/20 0830 05/02/20 0945  WBC 33.1* 24.2* 29.8* 27.4* 23.1*  NEUTROABS  --   --   --  25.1* 21.6*  HGB 9.0* 8.3* 8.4* 6.9* 7.2*  HCT 27.2* 24.4* 22.9* 19.3* 21.4*  MCV 92.5 91.0 86.7 88.1 90.7  PLT 193 165 135* 151 152   Cardiac Enzymes: No results for input(s): CKTOTAL, CKMB, CKMBINDEX, TROPONINI in the last 168 hours. BNP (last 3 results) No results for input(s): PROBNP in the last 8760 hours. CBG: Recent Labs  Lab 05/03/20 0844  GLUCAP 78   D-Dimer: No results for input(s): DDIMER in the last 72 hours. Hgb A1c: No results for input(s): HGBA1C in the last 72 hours. Lipid Profile: No results for input(s): CHOL, HDL, LDLCALC, TRIG, CHOLHDL, LDLDIRECT in the last 72 hours. Thyroid function studies: No results for input(s): TSH, T4TOTAL, T3FREE, THYROIDAB in the last 72 hours.  Invalid input(s): FREET3 Anemia work up: No results for input(s): VITAMINB12, FOLATE, FERRITIN, TIBC, IRON, RETICCTPCT in the last 72 hours. Sepsis Labs: Recent Labs  Lab 04/29/20 0457 04/30/20 0827 05/01/20 0830 05/02/20 0945  WBC 24.2* 29.8* 27.4* 23.1*    Microbiology Recent Results (from the past 240 hour(s))  CULTURE, BLOOD (ROUTINE X 2) w Reflex to ID Panel     Status: None (Preliminary result)   Collection Time: 05/01/20  2:50 PM   Specimen: BLOOD  Result Value Ref Range Status   Specimen Description BLOOD BLOOD LEFT HAND  Final   Special Requests   Final    BOTTLES DRAWN AEROBIC AND ANAEROBIC Blood Culture adequate volume   Culture   Final    NO GROWTH 2 DAYS Performed at [facility], [address] [place], [place] [address]    Report Status PENDING  Incomplete  CULTURE, BLOOD (ROUTINE X 2) w Reflex to ID Panel     Status: None (Preliminary result)   Collection  Time: 05/01/20  2:51 PM   Specimen: BLOOD  Result Value Ref Range Status   Specimen Description BLOOD BLOOD RIGHT HAND  Final   Special Requests   Final    BOTTLES DRAWN AEROBIC AND ANAEROBIC Blood Culture adequate volume   Culture   Final    NO GROWTH 2 DAYS Performed at [facility], [address] [place], [place] [address]    Report Status PENDING  Incomplete    Procedures and diagnostic studies:  CT HEAD WO CONTRAST  Result Date: 05/02/2020 CLINICAL DATA:  Delirium. Increased lethargy and confusion following dialysis. EXAM: CT HEAD WITHOUT CONTRAST TECHNIQUE: Contiguous axial images were obtained from the base of the skull through the vertex without intravenous contrast. COMPARISON:  06/02/2019 FINDINGS: Brain: There is no evidence of an acute infarct, intracranial hemorrhage, mass, midline shift, or extra-axial fluid collection. Mild chronic small vessel ischemic changes are again noted including a chronic lacunar infarct in the left caudate head. A lacunar infarct in the right caudate head is new but also chronic in appearance. The ventricles and sulci are normal. Vascular: Calcified atherosclerosis at the skull base. No hyperdense vessel. Skull: No fracture or suspicious osseous lesion. Sinuses/Orbits: Mild mucosal thickening in the paranasal sinuses with partially visualized small volume fluid in the right maxillary sinus. Clear mastoid air cells. Unremarkable orbits. Other: None. IMPRESSION: 1. No evidence of acute intracranial abnormality. 2. Mild chronic small vessel ischemia with chronic bilateral basal ganglia lacunar infarcts. Electronically  Signed   By: Logan Bores M.D.   On: 05/02/2020 17:11               LOS: 19 days   Priyal Musquiz  [facility]   Pager on [email or web address]. If 7PM-7AM, please contact night-coverage at [email or web address]     05/03/2020, 2:09 PM

## 2020-05-03 NOTE — Progress Notes (Signed)
Patient bathed, linens changed, becoming more alert throughout shift progression. Dressings to buttocks cleansed and changed, interdry dressing to abdominal folds/panniculus changed, tolerated fairly well.

## 2020-05-03 NOTE — Progress Notes (Signed)
Patient with MEWS of 2 throughout shift there is no acute change or decline to patient status, patient alertness improved, HR continues to be ST in low 100's since previous shifts, oxygen saturations stable on chronic 2 L.

## 2020-05-04 LAB — MAGNESIUM: Magnesium: 1.8 mg/dL (ref 1.7–2.4)

## 2020-05-04 LAB — RENAL FUNCTION PANEL
Albumin: 1.3 g/dL — ABNORMAL LOW (ref 3.5–5.0)
Anion gap: 13 (ref 5–15)
BUN: 52 mg/dL — ABNORMAL HIGH (ref 6–20)
CO2: 25 mmol/L (ref 22–32)
Calcium: 7.6 mg/dL — ABNORMAL LOW (ref 8.9–10.3)
Chloride: 98 mmol/L (ref 98–111)
Creatinine, Ser: 4.15 mg/dL — ABNORMAL HIGH (ref 0.44–1.00)
GFR calc Af Amer: 13 mL/min — ABNORMAL LOW (ref >60)
GFR calc non Af Amer: 11 mL/min — ABNORMAL LOW (ref >60)
Glucose, Bld: 93 mg/dL (ref 70–99)
Phosphorus: 5.8 mg/dL — ABNORMAL HIGH (ref 2.5–4.6)
Potassium: 3.7 mmol/L (ref 3.5–5.1)
Sodium: 136 mmol/L (ref 135–145)

## 2020-05-04 LAB — CBC WITH DIFFERENTIAL/PLATELET
Abs Immature Granulocytes: 0.31 10*3/uL — ABNORMAL HIGH (ref 0.00–0.07)
Basophils Absolute: 0 10*3/uL (ref 0.0–0.1)
Basophils Relative: 0 %
Eosinophils Absolute: 0.2 10*3/uL (ref 0.0–0.5)
Eosinophils Relative: 2 %
HCT: 19.5 % — ABNORMAL LOW (ref 36.0–46.0)
Hemoglobin: 6.7 g/dL — ABNORMAL LOW (ref 12.0–15.0)
Immature Granulocytes: 3 %
Lymphocytes Relative: 5 %
Lymphs Abs: 0.5 10*3/uL — ABNORMAL LOW (ref 0.7–4.0)
MCH: 30.9 pg (ref 26.0–34.0)
MCHC: 34.4 g/dL (ref 30.0–36.0)
MCV: 89.9 fL (ref 80.0–100.0)
Monocytes Absolute: 0.6 10*3/uL (ref 0.1–1.0)
Monocytes Relative: 5 %
Neutro Abs: 9.4 10*3/uL — ABNORMAL HIGH (ref 1.7–7.7)
Neutrophils Relative %: 85 %
Platelets: 162 10*3/uL (ref 150–400)
RBC: 2.17 MIL/uL — ABNORMAL LOW (ref 3.87–5.11)
RDW: 17.6 % — ABNORMAL HIGH (ref 11.5–15.5)
WBC: 11 10*3/uL — ABNORMAL HIGH (ref 4.0–10.5)
nRBC: 0.2 % (ref 0.0–0.2)

## 2020-05-04 MED ORDER — SODIUM CHLORIDE 0.9% IV SOLUTION: Freq: Once | INTRAVENOUS | Status: AC

## 2020-05-04 NOTE — Progress Notes (Signed)
OT Cancellation Note  Patient Details Name: KEZIAH AVIS MRN: [id number] DOB: Oct 09, 1960   Cancelled Treatment:    Reason Eval/Treat Not Completed: Medical issues which prohibited therapy  Upon chart review, pt noted to have Hgb of 6.7. Will f/u at later date/time for OT treatment as appropriate. Thank you.  Gerrianne Scale, [place], OTR/L ascom [phone] 05/04/20, 6:35 PM

## 2020-05-04 NOTE — Progress Notes (Signed)
[facility]  ROUNDING NOTE   Subjective:   Still lethargic and encephalopathic Did not eat her breakfast.  She told her nurse that she did not like the food    Objective:  Vital signs in last 24 hours:  Temp:  [98.5 F (36.9 C)-99.8 F (37.7 C)] 98.9 F (37.2 C) (09/05 0802) Pulse Rate:  [101-109] 105 (09/05 0802) Resp:  [18-25] 18 (09/05 0802) BP: (102-131)/(54-68) 102/54 (09/05 0802) SpO2:  [94 %-99 %] 97 % (09/05 0802)  Weight change:  Filed Weights   04/29/20 0500 05/02/20 0358 05/03/20 0547  Weight: 134.4 kg 135.2 kg 130.9 kg    Intake/Output: I/O last 3 completed shifts: In: 230 [P.O.:120; I.V.:10; IV Piggyback:100] Out: 300.5 [Urine:300; Drains:0.5]   Intake/Output this shift:  Total I/O In: 0  Out: 225 [Urine:225]  Physical Exam: General: Resting in bed,  Lungs:  diminished at the bases  Heart: S1S2, no rub or gallop  Abdomen:  Obese, nontender  Extremities:  Trace peripheral edema.  Neurologic:  Lethargic, arousable, answers 1 or 2 simple questions  Skin  warm, dry  Access: RIJ permcath 8/27 Dr. [name]    Basic Metabolic Panel: Recent Labs  Lab 04/29/20 0457 04/29/20 0457 04/30/20 0827 04/30/20 0827 05/01/20 0740 05/02/20 0945 05/03/20 0636 05/04/20 0543  NA 134*  --  137  --  134* 134*  --  136  K 4.6  --  5.9*  --  4.1 4.2  --  3.7  CL 98  --  101  --  96* 97*  --  98  CO2 24  --  15*  --  25 24  --  25  GLUCOSE 78  --  64*  --  73 76  --  93  BUN 84*  --  91*  --  55* 67*  --  52*  CREATININE 4.62*  --  5.23*  --  3.93* 4.71*  --  4.15*  CALCIUM 7.6*   < > 7.3*   < > 7.6* 7.8*  --  7.6*  MG 1.8   < > 1.7  --  1.7 1.8 1.6* 1.8  PHOS 6.9*  --  8.5*  --  6.2* 7.0*  --  5.8*   < > = values in this interval not displayed.    Liver Function Tests: Recent Labs  Lab 05/01/20 0705 05/02/20 0945 05/03/20 0636 05/04/20 0543  AST 28  --  27  --   ALT 17  --  18  --   ALKPHOS 97  --  101  --   BILITOT 1.4*  --  1.4*  --    PROT 5.3*  --  5.8*  --   ALBUMIN 1.4* 1.4* 1.5* 1.3*   No results for input(s): LIPASE, AMYLASE in the last 168 hours. No results for input(s): AMMONIA in the last 168 hours.  CBC: Recent Labs  Lab 04/29/20 0457 04/30/20 0827 05/01/20 0830 05/02/20 0945 05/04/20 0543  WBC 24.2* 29.8* 27.4* 23.1* 11.0*  NEUTROABS  --   --  25.1* 21.6* 9.4*  HGB 8.3* 8.4* 6.9* 7.2* 6.7*  HCT 24.4* 22.9* 19.3* 21.4* 19.5*  MCV 91.0 86.7 88.1 90.7 89.9  PLT 165 135* 151 152 162    Cardiac Enzymes: No results for input(s): CKTOTAL, CKMB, CKMBINDEX, TROPONINI in the last 168 hours.  BNP: Invalid input(s): POCBNP  CBG: Recent Labs  Lab 05/03/20 4132  GMWNUU 72    Microbiology: Results for orders placed or performed during the  hospital encounter of 04/14/20  SARS Coronavirus 2 by RT PCR (hospital order, performed in [facility] hospital lab) Nasopharyngeal Nasopharyngeal Swab     Status: None   Collection Time: 04/14/20  2:17 AM   Specimen: Nasopharyngeal Swab  Result Value Ref Range Status   SARS Coronavirus 2 NEGATIVE NEGATIVE Final    Comment: (NOTE) SARS-CoV-2 target nucleic acids are NOT DETECTED.  The SARS-CoV-2 RNA is generally detectable in upper and lower respiratory specimens during the acute phase of infection. The lowest concentration of SARS-CoV-2 viral copies this assay can detect is 250 copies / mL. A negative result does not preclude SARS-CoV-2 infection and should not be used as the sole basis for treatment or other patient management decisions.  A negative result may occur with improper specimen collection / handling, submission of specimen other than nasopharyngeal swab, presence of viral mutation(s) within the areas targeted by this assay, and inadequate number of viral copies (<250 copies / mL). A negative result must be combined with clinical observations, patient history, and epidemiological information.  Fact Sheet for Patients:    StrictlyIdeas.no  Fact Sheet for Healthcare Providers: BankingDealers.co.za  This test is not yet approved or  cleared by the [place] FDA and has been authorized for detection and/or diagnosis of SARS-CoV-2 by FDA under an Emergency Use Authorization (EUA).  This EUA will remain in effect (meaning this test can be used) for the duration of the COVID-19 declaration under Section 564(b)(1) of the Act, 21 U.S.C. section 360bbb-3(b)(1), unless the authorization is terminated or revoked sooner.  Performed at [facility], [address] [place], [place] [address]   CULTURE, BLOOD (ROUTINE X 2) w Reflex to ID Panel     Status: None   Collection Time: 04/14/20  8:14 AM   Specimen: BLOOD  Result Value Ref Range Status   Specimen Description BLOOD LEFT [facility]  Final   Special Requests   Final    BOTTLES DRAWN AEROBIC AND ANAEROBIC Blood Culture adequate volume   Culture   Final    NO GROWTH 5 DAYS Performed at [facility], [address] [place], [place] [address]    Report Status 04/19/2020 FINAL  Final  CULTURE, BLOOD (ROUTINE X 2) w Reflex to ID Panel     Status: None   Collection Time: 04/14/20  8:14 AM   Specimen: BLOOD  Result Value Ref Range Status   Specimen Description BLOOD LEFT WRIST  Final   Special Requests   Final    BOTTLES DRAWN AEROBIC AND ANAEROBIC Blood Culture adequate volume   Culture   Final    NO GROWTH 5 DAYS Performed at [facility], [address] [place], [place] [address]    Report Status 04/19/2020 FINAL  Final  Aerobic/Anaerobic Culture (surgical/deep wound)     Status: None   Collection Time: 04/14/20 11:14 AM   Specimen: Abscess  Result Value Ref Range Status   Specimen Description   Final    ABSCESS Performed at [facility], [address] [place], [place] [address]    Special Requests   Final    NONE Performed at [facility], [place], [place] [address]    Gram Stain   Final    NO WBC SEEN ABUNDANT GRAM NEGATIVE RODS FEW GRAM POSITIVE COCCI FEW GRAM POSITIVE RODS Performed at [facility], [place] [address] [place], [place] [address]    Culture   Final  ABUNDANT KLEBSIELLA PNEUMONIAE ABUNDANT PSEUDOMONAS AERUGINOSA MODERATE ESCHERICHIA COLI Confirmed Extended Spectrum Beta-Lactamase Producer (ESBL).  In bloodstream infections from ESBL organisms, carbapenems are preferred over piperacillin/tazobactam. They are shown to have a lower risk of mortality. MIXED ANAEROBIC FLORA PRESENT.  CALL LAB IF FURTHER IID REQUIRED.    Report Status 04/20/2020 FINAL  Final   Organism ID, Bacteria KLEBSIELLA PNEUMONIAE  Final   Organism ID, Bacteria PSEUDOMONAS AERUGINOSA  Final   Organism ID, Bacteria ESCHERICHIA COLI  Final      Susceptibility   Escherichia coli - MIC*    AMPICILLIN >=32 RESISTANT Resistant     CEFAZOLIN >=64 RESISTANT Resistant     CEFEPIME 16 RESISTANT Resistant     CEFTAZIDIME RESISTANT Resistant     CEFTRIAXONE >=64 RESISTANT Resistant     CIPROFLOXACIN >=4 RESISTANT Resistant     GENTAMICIN <=1 SENSITIVE Sensitive     IMIPENEM <=0.25 SENSITIVE Sensitive     TRIMETH/SULFA >=320 RESISTANT Resistant     AMPICILLIN/SULBACTAM >=32 RESISTANT Resistant     PIP/TAZO 8 SENSITIVE Sensitive     * MODERATE ESCHERICHIA COLI   Klebsiella pneumoniae - MIC*    AMPICILLIN >=32 RESISTANT Resistant     CEFAZOLIN <=4 SENSITIVE Sensitive     CEFEPIME <=0.12 SENSITIVE Sensitive     CEFTAZIDIME <=1 SENSITIVE Sensitive     CEFTRIAXONE <=0.25 SENSITIVE Sensitive     CIPROFLOXACIN <=0.25 SENSITIVE Sensitive     GENTAMICIN <=1 SENSITIVE Sensitive     IMIPENEM <=0.25 SENSITIVE Sensitive     TRIMETH/SULFA <=20 SENSITIVE Sensitive     AMPICILLIN/SULBACTAM 16 INTERMEDIATE Intermediate     PIP/TAZO 8 SENSITIVE Sensitive     * ABUNDANT KLEBSIELLA PNEUMONIAE   Pseudomonas aeruginosa - MIC*     CEFTAZIDIME 4 SENSITIVE Sensitive     CIPROFLOXACIN <=0.25 SENSITIVE Sensitive     GENTAMICIN 2 SENSITIVE Sensitive     IMIPENEM 2 SENSITIVE Sensitive     PIP/TAZO 8 SENSITIVE Sensitive     CEFEPIME 4 SENSITIVE Sensitive     * ABUNDANT PSEUDOMONAS AERUGINOSA  MRSA PCR Screening     Status: None   Collection Time: 04/14/20  6:19 PM   Specimen: Nasopharyngeal  Result Value Ref Range Status   MRSA by PCR NEGATIVE NEGATIVE Final    Comment:        The GeneXpert MRSA Assay (FDA approved for NASAL specimens only), is one component of a comprehensive MRSA colonization surveillance program. It is not intended to diagnose MRSA infection nor to guide or monitor treatment for MRSA infections. Performed at [facility], [address], [place], [place] [address]   Urine Culture     Status: None   Collection Time: 04/15/20  4:28 AM   Specimen: Urine, Random  Result Value Ref Range Status   Specimen Description   Final    URINE, RANDOM Performed at [facility], [address], [place], [place] [address]    Special Requests   Final    NONE Performed at [facility], [address], [place], [place] [address]    Culture   Final    NO GROWTH Performed at [facility], [place] [address], [place], [place] [address]    Report Status 04/16/2020 FINAL  Final  CULTURE, BLOOD (ROUTINE X 2) w Reflex to ID Panel     Status: None   Collection Time: 04/22/20  6:12 PM   Specimen: BLOOD  Result Value Ref Range Status   Specimen Description BLOOD  FOOT  Final   Special Requests   Final    BOTTLES DRAWN AEROBIC AND ANAEROBIC Blood Culture adequate volume   Culture   Final    NO GROWTH 5 DAYS Performed at [facility], [address] [place], [place] [address]    Report Status 04/27/2020 FINAL  Final  CULTURE, BLOOD (ROUTINE X 2) w Reflex to ID Panel     Status: None   Collection Time: 04/22/20  6:17 PM   Specimen: BLOOD  Result Value Ref Range  Status   Specimen Description BLOOD FOOT  Final   Special Requests   Final    BOTTLES DRAWN AEROBIC AND ANAEROBIC Blood Culture results may not be optimal due to an excessive volume of blood received in culture bottles   Culture   Final    NO GROWTH 5 DAYS Performed at [facility], [address] [place], [place] [address]    Report Status 04/27/2020 FINAL  Final  CULTURE, BLOOD (ROUTINE X 2) w Reflex to ID Panel     Status: None (Preliminary result)   Collection Time: 05/01/20  2:50 PM   Specimen: BLOOD  Result Value Ref Range Status   Specimen Description BLOOD BLOOD LEFT HAND  Final   Special Requests   Final    BOTTLES DRAWN AEROBIC AND ANAEROBIC Blood Culture adequate volume   Culture   Final    NO GROWTH 3 DAYS Performed at [facility], [address] [place], [place] [address]    Report Status PENDING  Incomplete  CULTURE, BLOOD (ROUTINE X 2) w Reflex to ID Panel     Status: None (Preliminary result)   Collection Time: 05/01/20  2:51 PM   Specimen: BLOOD  Result Value Ref Range Status   Specimen Description BLOOD BLOOD RIGHT HAND  Final   Special Requests   Final    BOTTLES DRAWN AEROBIC AND ANAEROBIC Blood Culture adequate volume   Culture   Final    NO GROWTH 3 DAYS Performed at [facility], [address] [place], [place] [address]    Report Status PENDING  Incomplete    Coagulation Studies: No results for input(s): LABPROT, INR in the last 72 hours.  Urinalysis: No results for input(s): COLORURINE, LABSPEC, PHURINE, GLUCOSEU, HGBUR, BILIRUBINUR, KETONESUR, PROTEINUR, UROBILINOGEN, NITRITE, LEUKOCYTESUR in the last 72 hours.  Invalid input(s): APPERANCEUR    Imaging: CT HEAD WO CONTRAST  Result Date: 05/02/2020 CLINICAL DATA:  Delirium. Increased lethargy and confusion following dialysis. EXAM: CT HEAD WITHOUT CONTRAST TECHNIQUE: Contiguous axial images were obtained from the base of the skull through the vertex without  intravenous contrast. COMPARISON:  06/02/2019 FINDINGS: Brain: There is no evidence of an acute infarct, intracranial hemorrhage, mass, midline shift, or extra-axial fluid collection. Mild chronic small vessel ischemic changes are again noted including a chronic lacunar infarct in the left caudate head. A lacunar infarct in the right caudate head is new but also chronic in appearance. The ventricles and sulci are normal. Vascular: Calcified atherosclerosis at the skull base. No hyperdense vessel. Skull: No fracture or suspicious osseous lesion. Sinuses/Orbits: Mild mucosal thickening in the paranasal sinuses with partially visualized small volume fluid in the right maxillary sinus. Clear mastoid air cells. Unremarkable orbits. Other: None. IMPRESSION: 1. No evidence of acute intracranial abnormality. 2. Mild chronic small vessel ischemia with chronic bilateral basal ganglia lacunar infarcts. Electronically Signed   By: Logan Bores M.D.   On: 05/02/2020 17:11     Medications:   .  sodium chloride 250 mL (05/03/20 0043)  . anidulafungin 100 mg (05/03/20 2054)  . meropenem (MERREM) IV 500 mg (05/04/20 1055)   . (feeding supplement) PROSource Plus  30 mL Oral TID BM  . sodium chloride   Intravenous Once  . acidophilus  1 capsule Oral Daily  . alteplase  2 mg Intracatheter Once  . alteplase  2 mg Intracatheter Once  . amiodarone  200 mg Oral BID  . Chlorhexidine Gluconate Cloth  6 each Topical Daily  . docusate sodium  200 mg Oral BID  . [START ON 05/05/2020] epoetin (EPOGEN/PROCRIT) injection  10,000 Units Intravenous Q M,W,F-HD  . famotidine  10 mg Oral Q1200  . feeding supplement (NEPRO CARB STEADY)  237 mL Oral TID BM  . guaiFENesin-dextromethorphan  10 mL Oral Q8H  . heparin injection (subcutaneous)  5,000 Units Subcutaneous Q8H  . magnesium oxide  400 mg Oral Daily  . mometasone-formoterol  2 puff Inhalation BID  . multivitamin  1 tablet Oral QHS  . pregabalin  25 mg Oral Daily  . sodium  chloride flush  5 mL Intracatheter Q8H  . torsemide  40 mg Oral Daily   acetaminophen **OR** acetaminophen, albuterol, ALPRAZolam, bisacodyl, diphenhydrAMINE, heparin NICU/SCN flush, HYDROmorphone (DILAUDID) injection, iohexol, ipratropium-albuterol, labetalol, magic mouthwash **AND** lidocaine, magnesium hydroxide, menthol-cetylpyridinium, oxyCODONE-acetaminophen, phenol, simethicone  Assessment/ Plan:  Melanie Bowen is a 59 y.o. black female with HIV, COPD, hypertension, GERD, who is admitted to [facility] on 04/14/2020 for Dyspnea [R06.00] Colonic diverticular abscess [K57.20] Diverticulitis of large intestine with abscess without bleeding [K57.20] Abscess of sigmoid colon due to diverticulitis [K57.20]  #Acute renal failure with hyperkalemia: No history of chronic kidney disease. History of bland urine.  Requiring hemodialysis. First treatment on 8/18.  IV contrast exposure on 8/16. Ultrasound -negative for obstruction or mass.   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  Dialysis tomorrow with IV albumin supplementation   #. Anemia of kidney failure:  Lab Results  Component Value Date   HGB 6.7 (L) 05/04/2020   -Continue Epogen with dialysis treatments - Low threshold for PRBC transfusion.   #Lower extremity edema Start oral torsemide    LOS: 20 Melanie Bowen 9/5/20211:35 PM

## 2020-05-04 NOTE — Progress Notes (Signed)
Progress Note    Melanie Bowen  ZOX:[id number] DOB: [birth year]  DOA: 04/14/2020 PCP: Theotis Burrow, MD      Brief Narrative:    Medical records reviewed and are as summarized below:  Melanie Bowen is a 59 y.o. female       Assessment/Plan:   Principal Problem:   Severe sepsis with septic shock ([place]) Active Problems:   HTN (hypertension)   HIV (human immunodeficiency virus infection) ([place])   Diverticulitis of large intestine with abscess without bleeding   Abscess of sigmoid colon due to diverticulitis   Acute renal failure (ARF) ([place])   Hypotension   Diverticulosis   Acute respiratory distress   DNR (do not resuscitate) discussion   Palliative care by specialist   Dyspnea    Nutrition Problem: Increased nutrient needs Etiology: chronic illness (COPD, new HD)  Signs/Symptoms: estimated needs   Body mass index is 46.57 kg/m.  (Morbid obesity): This complicates overall care and prognosis   Septic shock - resolved  Acute sigmoid diverticulitis and diverticular abscess, significant leukocytosis- s/p CT-guided drainage by IR on 04/14/2020.  Fluid culture showed Klebsiella pneumonia, pseudomonas aeruginosa and E. coli.  Acute kidney injury complicated by hyperkalemia and metabolic acidosis  Atrial fibrillation with RVR   Acute anemia, likely multifactorial poor oral intake and acute illness (no evidence of bleeding thus far)  Sinus tachycardia  COPD exacerbation-resolved  HIV infection  Acute liver failure-resolved  Acute hypoxemic respiratory failure-resolved  Acute metabolic encephalopathy/delirium -recurrent.  No acute abnormality on CT head  History of stroke  PLAN  Transfuse 1 unit of PRBCs today because of severe anemia (hemoglobin down to 6.7).  S/p transfusion with 1 unit of PRBCs on 05/01/2020.  Continue to monitor H&H and transfuse as needed. Appreciate input from general surgeon Continue IV meropenem Follow-up with  nephrologist for hemodialysis Continue antiretroviral therapy Continue bronchodilators Continue amiodarone.  Consideration for long-term anticoagulation in the future Turn patient every 2 hours.     Diet Order            Diet renal with fluid restriction Room service appropriate? Yes; Fluid consistency: Thin  Diet effective now                    Consultants:  General surgeon  Cardiologist  Nephrologist  Vascular surgeon  Procedures:  CT-guided drainage of diverticular abscess on 04/14/2020    Medications:   . (feeding supplement) PROSource Plus  30 mL Oral TID BM  . sodium chloride   Intravenous Once  . acidophilus  1 capsule Oral Daily  . alteplase  2 mg Intracatheter Once  . alteplase  2 mg Intracatheter Once  . amiodarone  200 mg Oral BID  . Chlorhexidine Gluconate Cloth  6 each Topical Daily  . docusate sodium  200 mg Oral BID  . [START ON 05/05/2020] epoetin (EPOGEN/PROCRIT) injection  10,000 Units Intravenous Q M,W,F-HD  . famotidine  10 mg Oral Q1200  . feeding supplement (NEPRO CARB STEADY)  237 mL Oral TID BM  . guaiFENesin-dextromethorphan  10 mL Oral Q8H  . heparin injection (subcutaneous)  5,000 Units Subcutaneous Q8H  . magnesium oxide  400 mg Oral Daily  . mometasone-formoterol  2 puff Inhalation BID  . multivitamin  1 tablet Oral QHS  . pregabalin  25 mg Oral Daily  . sodium chloride flush  5 mL Intracatheter Q8H  . torsemide  40 mg Oral Daily   Continuous Infusions: .  sodium chloride 250 mL (05/03/20 0043)  . anidulafungin 100 mg (05/03/20 2054)  . meropenem (MERREM) IV 500 mg (05/04/20 1055)     Anti-infectives (From admission, onward)   Start     Dose/Rate Route Frequency Ordered Stop   05/03/20 2000  anidulafungin (ERAXIS) 100 mg in sodium chloride 0.9 % 100 mL IVPB        100 mg 78 mL/hr over 100 Minutes Intravenous Every 24 hours 05/02/20 2223     05/02/20 2330  anidulafungin (ERAXIS) 200 mg in sodium chloride 0.9 % 200 mL IVPB         200 mg 78 mL/hr over 200 Minutes Intravenous  Once 05/02/20 2223 05/03/20 0455   04/30/20 0800  rilpivirine (EDURANT) tablet 25 mg  Status:  Discontinued       "And" Linked Group Details   25 mg Oral Daily with breakfast 04/29/20 1254 05/02/20 2223   04/30/20 0800  dolutegravir (TIVICAY) tablet 50 mg  Status:  Discontinued       "And" Linked Group Details   50 mg Oral Daily with breakfast 04/29/20 1254 05/02/20 2223   04/28/20 2200  meropenem (MERREM) 500 mg in sodium chloride 0.9 % 100 mL IVPB        500 mg 200 mL/hr over 30 Minutes Intravenous Every 12 hours 04/28/20 1546     04/26/20 2200  meropenem (MERREM) 500 mg in sodium chloride 0.9 % 100 mL IVPB  Status:  Discontinued        500 mg 200 mL/hr over 30 Minutes Intravenous Every 8 hours 04/26/20 1258 04/28/20 1546   04/25/20 1700  rilpivirine (EDURANT) tablet 25 mg  Status:  Discontinued        25 mg Oral Daily with breakfast 04/24/20 1636 04/25/20 1111   04/25/20 1200  rilpivirine (EDURANT) tablet 25 mg  Status:  Discontinued        25 mg Oral Daily with breakfast 04/25/20 1111 04/29/20 1254   04/25/20 1000  meropenem (MERREM) 500 mg in sodium chloride 0.9 % 100 mL IVPB  Status:  Discontinued        500 mg 200 mL/hr over 30 Minutes Intravenous Every 12 hours 04/25/20 0935 04/26/20 1258   04/24/20 1700  darunavir-cobicistat (PREZCOBIX) 800-150 MG per tablet 1 tablet  Status:  Discontinued        1 tablet Oral Daily with breakfast 04/24/20 1231 04/24/20 1636   04/24/20 1330  dolutegravir (TIVICAY) tablet 50 mg  Status:  Discontinued        50 mg Oral Daily 04/24/20 1231 04/29/20 1254   04/23/20 2200  meropenem (MERREM) 500 mg in sodium chloride 0.9 % 100 mL IVPB  Status:  Discontinued        500 mg 200 mL/hr over 30 Minutes Intravenous Every 24 hours 04/23/20 1127 04/25/20 0935   04/18/20 2000  levofloxacin (LEVAQUIN) IVPB 750 mg        750 mg 100 mL/hr over 90 Minutes Intravenous  Once 04/18/20 1857 04/19/20 0004   04/18/20  1800  levofloxacin (LEVAQUIN) IVPB 750 mg  Status:  Discontinued        750 mg 100 mL/hr over 90 Minutes Intravenous  Once 04/18/20 1640 04/18/20 1857   04/16/20 2200  anidulafungin (ERAXIS) 100 mg in sodium chloride 0.9 % 100 mL IVPB        100 mg 78 mL/hr over 100 Minutes Intravenous Every 24 hours 04/16/20 1707 04/22/20 0211   04/15/20 2200  valACYclovir (VALTREX) tablet 500  mg  Status:  Discontinued        500 mg Oral Daily at bedtime 04/15/20 1248 04/16/20 1259   04/15/20 2200  anidulafungin (ERAXIS) 200 mg in sodium chloride 0.9 % 200 mL IVPB        200 mg 78 mL/hr over 200 Minutes Intravenous  Once 04/15/20 2008 04/16/20 0327   04/15/20 2200  meropenem (MERREM) 1 g in sodium chloride 0.9 % 100 mL IVPB  Status:  Discontinued        1 g 200 mL/hr over 30 Minutes Intravenous Every 12 hours 04/15/20 2017 04/23/20 1127   04/15/20 1400  piperacillin-tazobactam (ZOSYN) IVPB 3.375 g  Status:  Discontinued        3.375 g 12.5 mL/hr over 240 Minutes Intravenous Every 8 hours 04/15/20 1300 04/15/20 2006   04/14/20 2200  elvitegravir-cobicistat-emtricitabine-tenofovir (GENVOYA) 150-150-200-10 MG tablet 1 tablet  Status:  Discontinued        1 tablet Oral Daily at bedtime 04/14/20 0235 04/15/20 0957   04/14/20 2200  valACYclovir (VALTREX) tablet 1,000 mg  Status:  Discontinued        1,000 mg Oral Daily at bedtime 04/14/20 0235 04/15/20 1248   04/14/20 1230  cefTRIAXone (ROCEPHIN) 2 g in sodium chloride 0.9 % 100 mL IVPB  Status:  Discontinued        2 g 200 mL/hr over 30 Minutes Intravenous Every 24 hours 04/14/20 1223 04/15/20 1300   04/14/20 1230  metroNIDAZOLE (FLAGYL) IVPB 500 mg  Status:  Discontinued        500 mg 100 mL/hr over 60 Minutes Intravenous Every 8 hours 04/14/20 1223 04/15/20 1636   04/14/20 1030  piperacillin-tazobactam (ZOSYN) IVPB 3.375 g  Status:  Discontinued        3.375 g 12.5 mL/hr over 240 Minutes Intravenous Every 8 hours 04/14/20 0235 04/14/20 1223   04/14/20  0215  piperacillin-tazobactam (ZOSYN) IVPB 4.5 g  Status:  Discontinued        4.5 g 200 mL/hr over 30 Minutes Intravenous  Once 04/14/20 0200 04/14/20 0208   04/14/20 0215  piperacillin-tazobactam (ZOSYN) IVPB 3.375 g        3.375 g 100 mL/hr over 30 Minutes Intravenous  Once 04/14/20 0208 04/14/20 0301             Family Communication/Anticipated D/C date and plan/Code Status   DVT prophylaxis: heparin injection 5,000 Units Start: 04/23/20 0800 because of high risk for DVT     Code Status: Partial Code  Family Communication: Plan discussed with patient Disposition Plan:    Status is: Inpatient  Remains inpatient appropriate because:Inpatient level of care appropriate due to severity of illness and Requiring hemodialysis for acute kidney injury   Dispo: The patient is from: Home              Anticipated d/c is to: SNF              Anticipated d/c date is: > 3 days              Patient currently is not medically stable to d/c.           Subjective:   She is confused unable to provide an adequate history.  She said she has no pain.    Objective:    Vitals:   05/03/20 1951 05/04/20 0026 05/04/20 0642 05/04/20 0802  BP: (!) 105/55 (!) 115/56 (!) 114/58 (!) 102/54  Pulse: (!) 101 (!) 106 (!) 106 (!) 105  Resp: 20 20  18   Temp: 99.8 F (37.7 C) 99.8 F (37.7 C) 99.8 F (37.7 C) 98.9 F (37.2 C)  TempSrc: Oral Oral Oral Oral  SpO2: 99% 94% 96% 97%  Weight:      Height:       No data found.   Intake/Output Summary (Last 24 hours) at 05/04/2020 1425 Last data filed at 05/04/2020 0910 Gross per 24 hour  Intake 230 ml  Output 525 ml  Net -295 ml   Filed Weights   04/29/20 0500 05/02/20 0358 05/03/20 0547  Weight: 134.4 kg 135.2 kg 130.9 kg    Exam:  GEN: NAD SKIN: Raw areas on bilateral buttocks and bilateral thighs EYES: EOMI ENT: MMM CV: Regular rate but tachycardic PULM: Mild expiratory wheezing bilaterally but no rales heard ABD: soft,  ND, NT, +BS, +LLQ JP drain with purulent fluid CNS: Drowsy but arousable, oriented to person and place. non focal EXT: Bilateral leg edema, no tenderness     Data Reviewed:   I have personally reviewed following labs and imaging studies:  Labs: Labs show the following:   Basic Metabolic Panel: Recent Labs  Lab 04/29/20 0457 04/29/20 0457 04/30/20 0827 04/30/20 0827 05/01/20 0740 05/01/20 0740 05/02/20 0945 05/03/20 0636 05/04/20 0543  NA 134*  --  137  --  134*  --  134*  --  136  K 4.6   < > 5.9*   < > 4.1   < > 4.2  --  3.7  CL 98  --  101  --  96*  --  97*  --  98  CO2 24  --  15*  --  25  --  24  --  25  GLUCOSE 78  --  64*  --  73  --  76  --  93  BUN 84*  --  91*  --  55*  --  67*  --  52*  CREATININE 4.62*  --  5.23*  --  3.93*  --  4.71*  --  4.15*  CALCIUM 7.6*  --  7.3*  --  7.6*  --  7.8*  --  7.6*  MG 1.8   < > 1.7  --  1.7  --  1.8 1.6* 1.8  PHOS 6.9*  --  8.5*  --  6.2*  --  7.0*  --  5.8*   < > = values in this interval not displayed.   GFR Estimated Creatinine Clearance: 20.5 mL/min (A) (by C-G formula based on SCr of 4.15 mg/dL (H)). Liver Function Tests: Recent Labs  Lab 05/01/20 0705 05/02/20 0945 05/03/20 0636 05/04/20 0543  AST 28  --  27  --   ALT 17  --  18  --   ALKPHOS 97  --  101  --   BILITOT 1.4*  --  1.4*  --   PROT 5.3*  --  5.8*  --   ALBUMIN 1.4* 1.4* 1.5* 1.3*   No results for input(s): LIPASE, AMYLASE in the last 168 hours. No results for input(s): AMMONIA in the last 168 hours. Coagulation profile No results for input(s): INR, PROTIME in the last 168 hours.  CBC: Recent Labs  Lab 04/29/20 0457 04/30/20 0827 05/01/20 0830 05/02/20 0945 05/04/20 0543  WBC 24.2* 29.8* 27.4* 23.1* 11.0*  NEUTROABS  --   --  25.1* 21.6* 9.4*  HGB 8.3* 8.4* 6.9* 7.2* 6.7*  HCT 24.4* 22.9* 19.3* 21.4* 19.5*  MCV 91.0 86.7 88.1 90.7  89.9  PLT 165 135* 151 152 162   Cardiac Enzymes: No results for input(s): CKTOTAL, CKMB, CKMBINDEX,  TROPONINI in the last 168 hours. BNP (last 3 results) No results for input(s): PROBNP in the last 8760 hours. CBG: Recent Labs  Lab 05/03/20 0844  GLUCAP 78   D-Dimer: No results for input(s): DDIMER in the last 72 hours. Hgb A1c: No results for input(s): HGBA1C in the last 72 hours. Lipid Profile: No results for input(s): CHOL, HDL, LDLCALC, TRIG, CHOLHDL, LDLDIRECT in the last 72 hours. Thyroid function studies: No results for input(s): TSH, T4TOTAL, T3FREE, THYROIDAB in the last 72 hours.  Invalid input(s): FREET3 Anemia work up: No results for input(s): VITAMINB12, FOLATE, FERRITIN, TIBC, IRON, RETICCTPCT in the last 72 hours. Sepsis Labs: Recent Labs  Lab 04/30/20 0827 05/01/20 0830 05/02/20 0945 05/04/20 0543  WBC 29.8* 27.4* 23.1* 11.0*    Microbiology Recent Results (from the past 240 hour(s))  CULTURE, BLOOD (ROUTINE X 2) w Reflex to ID Panel     Status: None (Preliminary result)   Collection Time: 05/01/20  2:50 PM   Specimen: BLOOD  Result Value Ref Range Status   Specimen Description BLOOD BLOOD LEFT HAND  Final   Special Requests   Final    BOTTLES DRAWN AEROBIC AND ANAEROBIC Blood Culture adequate volume   Culture   Final    NO GROWTH 3 DAYS Performed at [facility], [address] [place], [place] [address]    Report Status PENDING  Incomplete  CULTURE, BLOOD (ROUTINE X 2) w Reflex to ID Panel     Status: None (Preliminary result)   Collection Time: 05/01/20  2:51 PM   Specimen: BLOOD  Result Value Ref Range Status   Specimen Description BLOOD BLOOD RIGHT HAND  Final   Special Requests   Final    BOTTLES DRAWN AEROBIC AND ANAEROBIC Blood Culture adequate volume   Culture   Final    NO GROWTH 3 DAYS Performed at [facility], [address], [place], [place] [address]    Report Status PENDING  Incomplete    Procedures and diagnostic studies:  CT HEAD WO CONTRAST  Result Date: 05/02/2020 CLINICAL DATA:  Delirium.  Increased lethargy and confusion following dialysis. EXAM: CT HEAD WITHOUT CONTRAST TECHNIQUE: Contiguous axial images were obtained from the base of the skull through the vertex without intravenous contrast. COMPARISON:  06/02/2019 FINDINGS: Brain: There is no evidence of an acute infarct, intracranial hemorrhage, mass, midline shift, or extra-axial fluid collection. Mild chronic small vessel ischemic changes are again noted including a chronic lacunar infarct in the left caudate head. A lacunar infarct in the right caudate head is new but also chronic in appearance. The ventricles and sulci are normal. Vascular: Calcified atherosclerosis at the skull base. No hyperdense vessel. Skull: No fracture or suspicious osseous lesion. Sinuses/Orbits: Mild mucosal thickening in the paranasal sinuses with partially visualized small volume fluid in the right maxillary sinus. Clear mastoid air cells. Unremarkable orbits. Other: None. IMPRESSION: 1. No evidence of acute intracranial abnormality. 2. Mild chronic small vessel ischemia with chronic bilateral basal ganglia lacunar infarcts. Electronically Signed   By: Logan Bores M.D.   On: 05/02/2020 17:11               LOS: 20 days   Keoni Risinger  [facility]   Pager on [email or web address]. If 7PM-7AM, please contact night-coverage at [email or web address]     05/04/2020, 2:25 PM

## 2020-05-05 ENCOUNTER — Inpatient Hospital Stay: Payer: Medicare Other

## 2020-05-05 DIAGNOSIS — A419 Sepsis, unspecified organism: Secondary | ICD-10-CM | POA: Diagnosis not present

## 2020-05-05 DIAGNOSIS — R601 Generalized edema: Secondary | ICD-10-CM

## 2020-05-05 DIAGNOSIS — D696 Thrombocytopenia, unspecified: Secondary | ICD-10-CM

## 2020-05-05 DIAGNOSIS — R6521 Severe sepsis with septic shock: Secondary | ICD-10-CM | POA: Diagnosis not present

## 2020-05-05 DIAGNOSIS — I959 Hypotension, unspecified: Secondary | ICD-10-CM

## 2020-05-05 DIAGNOSIS — D72829 Elevated white blood cell count, unspecified: Secondary | ICD-10-CM

## 2020-05-05 DIAGNOSIS — K5720 Diverticulitis of large intestine with perforation and abscess without bleeding: Secondary | ICD-10-CM | POA: Diagnosis not present

## 2020-05-05 DIAGNOSIS — G9341 Metabolic encephalopathy: Secondary | ICD-10-CM

## 2020-05-05 DIAGNOSIS — I4891 Unspecified atrial fibrillation: Secondary | ICD-10-CM

## 2020-05-05 DIAGNOSIS — J9601 Acute respiratory failure with hypoxia: Secondary | ICD-10-CM

## 2020-05-05 LAB — CBC WITH DIFFERENTIAL/PLATELET
Abs Immature Granulocytes: 0.11 10*3/uL — ABNORMAL HIGH (ref 0.00–0.07)
Basophils Absolute: 0 10*3/uL (ref 0.0–0.1)
Basophils Relative: 0 %
Eosinophils Absolute: 0.2 10*3/uL (ref 0.0–0.5)
Eosinophils Relative: 3 %
HCT: 20.6 % — ABNORMAL LOW (ref 36.0–46.0)
Hemoglobin: 6.8 g/dL — ABNORMAL LOW (ref 12.0–15.0)
Immature Granulocytes: 2 %
Lymphocytes Relative: 6 %
Lymphs Abs: 0.5 10*3/uL — ABNORMAL LOW (ref 0.7–4.0)
MCH: 30.2 pg (ref 26.0–34.0)
MCHC: 33 g/dL (ref 30.0–36.0)
MCV: 91.6 fL (ref 80.0–100.0)
Monocytes Absolute: 0.5 10*3/uL (ref 0.1–1.0)
Monocytes Relative: 8 %
Neutro Abs: 5.9 10*3/uL (ref 1.7–7.7)
Neutrophils Relative %: 81 %
Platelets: 216 10*3/uL (ref 150–400)
RBC: 2.25 MIL/uL — ABNORMAL LOW (ref 3.87–5.11)
RDW: 17.1 % — ABNORMAL HIGH (ref 11.5–15.5)
WBC: 7.2 10*3/uL (ref 4.0–10.5)
nRBC: 0.4 % — ABNORMAL HIGH (ref 0.0–0.2)

## 2020-05-05 LAB — TYPE AND SCREEN
ABO/RH(D): O POS
Antibody Screen: NEGATIVE
Unit division: 0
Unit division: 0

## 2020-05-05 LAB — RENAL FUNCTION PANEL
Albumin: 1.3 g/dL — ABNORMAL LOW (ref 3.5–5.0)
Anion gap: 12 (ref 5–15)
BUN: 66 mg/dL — ABNORMAL HIGH (ref 6–20)
CO2: 26 mmol/L (ref 22–32)
Calcium: 7.5 mg/dL — ABNORMAL LOW (ref 8.9–10.3)
Chloride: 101 mmol/L (ref 98–111)
Creatinine, Ser: 4.66 mg/dL — ABNORMAL HIGH (ref 0.44–1.00)
GFR calc Af Amer: 11 mL/min — ABNORMAL LOW (ref >60)
GFR calc non Af Amer: 10 mL/min — ABNORMAL LOW (ref >60)
Glucose, Bld: 103 mg/dL — ABNORMAL HIGH (ref 70–99)
Phosphorus: 6.4 mg/dL — ABNORMAL HIGH (ref 2.5–4.6)
Potassium: 3.9 mmol/L (ref 3.5–5.1)
Sodium: 139 mmol/L (ref 135–145)

## 2020-05-05 LAB — BPAM RBC
Blood Product Expiration Date: 202109072359
Blood Product Expiration Date: 202110012359
ISSUE DATE / TIME: 202109021600
Unit Type and Rh: 5100
Unit Type and Rh: 9500

## 2020-05-05 LAB — PREPARE RBC (CROSSMATCH)

## 2020-05-05 LAB — HIV-1 RNA QUANT-NO REFLEX-BLD
HIV 1 RNA Quant: <20 copies/mL
LOG10 HIV-1 RNA: UNDETERMINED log10copy/mL

## 2020-05-05 LAB — MAGNESIUM: Magnesium: 1.9 mg/dL (ref 1.7–2.4)

## 2020-05-05 MED ORDER — SODIUM CHLORIDE 0.9 % IV SOLN
500.0000 mg | INTRAVENOUS | Status: DC
  Filled 2020-05-05: qty 0.5

## 2020-05-05 NOTE — Progress Notes (Signed)
Date of Admission:  04/14/2020  Meropenem since 04/15/20  Pt was somnolent and on waking up she is responding to questions- but speech is not clear   ID: Melanie Bowen is a 59 y.o. female  Principal Problem:   Severe sepsis with septic shock ([place]) Active Problems:   HTN (hypertension)   HIV (human immunodeficiency virus infection) ([place])   Diverticulitis of large intestine with abscess without bleeding   Abscess of sigmoid colon due to diverticulitis   Acute renal failure (ARF) ([place])   Hypotension   Diverticulosis   Acute respiratory distress   DNR (do not resuscitate) discussion   Palliative care by specialist   Dyspnea   Medications:  . (feeding supplement) PROSource Plus  30 mL Oral TID BM  . acidophilus  1 capsule Oral Daily  . alteplase  2 mg Intracatheter Once  . alteplase  2 mg Intracatheter Once  . amiodarone  200 mg Oral BID  . Chlorhexidine Gluconate Cloth  6 each Topical Daily  . docusate sodium  200 mg Oral BID  . epoetin (EPOGEN/PROCRIT) injection  10,000 Units Intravenous Q M,W,F-HD  . famotidine  10 mg Oral Q1200  . feeding supplement (NEPRO CARB STEADY)  237 mL Oral TID BM  . guaiFENesin-dextromethorphan  10 mL Oral Q8H  . heparin injection (subcutaneous)  5,000 Units Subcutaneous Q8H  . magnesium oxide  400 mg Oral Daily  . mometasone-formoterol  2 puff Inhalation BID  . multivitamin  1 tablet Oral QHS  . pregabalin  25 mg Oral Daily  . sodium chloride flush  5 mL Intracatheter Q8H  . torsemide  40 mg Oral Daily    Objective: Vital signs in last 24 hours: Temp:  [98.6 F (37 C)-101.6 F (38.7 C)] 98.7 F (37.1 C) (09/06 1610) Pulse Rate:  [100-124] 101 (09/06 1610) Resp:  [20-30] 21 (09/06 1610) BP: (106-126)/(52-96) 122/61 (09/06 1610) SpO2:  [91 %-100 %] 100 % (09/06 1610)  PHYSICAL EXAM:  General: lethargic, no  respiratory distress, oriented in person , oriented in person, place and talked to some one on the phone but could not  understand what she was saying Lungs: b/l air entry- decreased rt side Heart: Tachycardia Abdomen: Soft,distended. Left lower quadrant drain-  Extremities: edema legs, edema rt arm  Skin: superficial tears back/intergluteal Lymph: Cervical, supraclavicular normal. Neurologic:moves all limbs follows commands  Lab Results  Recent Labs    05/04/20 0543 05/05/20 0831  WBC 11.0* 7.2  HGB 6.7* 6.8*  HCT 19.5* 20.6*  NA 136 139  K 3.7 3.9  CL 98 101  CO2 25 26  BUN 52* 66*  CREATININE 4.15* 4.66*   Liver Panel Recent Labs    05/03/20 0636 05/03/20 0636 05/04/20 0543 05/05/20 0831  PROT 5.8*  --   --   --   ALBUMIN 1.5*   < > 1.3* 1.3*  AST 27  --   --   --   ALT 18  --   --   --   ALKPHOS 101  --   --   --   BILITOT 1.4*  --   --   --   BILIDIR 0.4*  --   --   --   IBILI 1.0*  --   --   --    < > = values in this interval not displayed.   Microbiology: Bc 8/24 NG 89/2 NG Studies/Results:   04/28/20 CT abdomen: Moderate volume intraperitoneal fluid, increased since 04/18/2020. No definite  well-defined fluid collection to suggest abscess is identified, however evaluation is limited due to the lack of intravenous contrast. A pigtail catheter has been partially retracted but remains located in the lower abdomen. 2. Multiple mildly dilated loops of small bowel in the mid abdomen are similar to prior exam and likely reflect ileus. 3. Moderate right pleural effusion with associated atelectasis.  Assessment/Plan:  Septic shocksecondary to perforated diverticulitisand peridiverticular abscess. pseudomonas, kleb andESBLe.coli in the culture. JP drain inserted on 04/14/20.  Onmeropenem since 04/15/20>> day  21 of meropenem- will stop it today 05/06/20 anidulafungin 8/17>>8/24 and restarted on 05/02/20 for fever and leucocytosis- leucocytosis has resolved so will continue After stopping meropenem if leucocytosis and fever worsens will need  paracentesis  Encephalopathy-very likely metabolic- r/o co2 narcosis- recommend ABG   Hypoxic resp failure-was extubated but now the resp status  very likely fluid overload-  Encephalopathy-very likely metabolic  Transaminitis due to shock liver resolved  AKI due to septic shock-getting dialysis  Anasarca Rt arm swelling- DVT was ruled out on 04/22/20- may have to repeat imaging   HIV- well controlled in jan 2021 her Vl <20 and cd4 >1000.  WasOn Genvoya. This has 4 different meds and as  we need to adjust the dose because of AKI. Chose a Non NRTI regimen to dolutegravir+rilpavirine on 8/ instead Dolutegravir + prezcobix ( darunavir+ cobi) VS  Because of encephalopathy and worsening clinical picture Dolutegravir and rilpivarine on hold since 05/02/20. But I dont think that is the reason for her encephalopathy- so will restart  CD4 count is 265 with 33% on 8/16/2021and Vl <20 On 05/03/20 Vl is < 20 Cd4 pending    Afib on amiodarone   Transaminitis due to shock liver resolved  Thrombocytopenia-resolved  Discussed the management with  Hospitalist

## 2020-05-05 NOTE — Progress Notes (Signed)
[facility]  ROUNDING NOTE   Subjective:   Still lethargic and encephalopathic Did not eat her breakfast.   Answering few questions but much of the speech is incomprehensible Denies any acute shortness of breath  Objective:  Vital signs in last 24 hours:  Temp:  [98.6 F (37 C)-101.6 F (38.7 C)] 98.9 F (37.2 C) (09/06 1302) Pulse Rate:  [100-124] 111 (09/06 1302) Resp:  [20-30] 26 (09/06 1235) BP: (106-126)/(52-96) 120/62 (09/06 1302) SpO2:  [91 %-100 %] 98 % (09/06 1302)  Weight change:  Filed Weights   04/29/20 0500 05/02/20 0358 05/03/20 0547  Weight: 134.4 kg 135.2 kg 130.9 kg    Intake/Output: I/O last 3 completed shifts: In: 120 [P.O.:120] Out: 865 [Urine:850; Drains:15]   Intake/Output this shift:  Total I/O In: 300.6 [IV Piggyback:300.6] Out: -   Physical Exam: General: Resting in bed,  Lungs:  diminished at the bases  Heart: S1S2, no rub or gallop  Abdomen:  Obese, nontender  Extremities:  Trace peripheral edema.  Neurologic:  Lethargic, arousable, answers 1 or 2 simple questions  Skin  warm, dry  Access: RIJ permcath 8/27 Dr. [name]    Basic Metabolic Panel: Recent Labs  Lab 04/30/20 0827 04/30/20 0827 05/01/20 0740 05/01/20 0740 05/02/20 0945 05/03/20 0636 05/04/20 0543 05/05/20 0831  NA 137  --  134*  --  134*  --  136 139  K 5.9*  --  4.1  --  4.2  --  3.7 3.9  CL 101  --  96*  --  97*  --  98 101  CO2 15*  --  25  --  24  --  25 26  GLUCOSE 64*  --  73  --  76  --  93 103*  BUN 91*  --  55*  --  67*  --  52* 66*  CREATININE 5.23*  --  3.93*  --  4.71*  --  4.15* 4.66*  CALCIUM 7.3*   < > 7.6*   < > 7.8*  --  7.6* 7.5*  MG 1.7   < > 1.7  --  1.8 1.6* 1.8 1.9  PHOS 8.5*  --  6.2*  --  7.0*  --  5.8* 6.4*   < > = values in this interval not displayed.    Liver Function Tests: Recent Labs  Lab 05/01/20 0705 05/02/20 0945 05/03/20 0636 05/04/20 0543 05/05/20 0831  AST 28  --  27  --   --   ALT 17  --  18  --   --    ALKPHOS 97  --  101  --   --   BILITOT 1.4*  --  1.4*  --   --   PROT 5.3*  --  5.8*  --   --   ALBUMIN 1.4* 1.4* 1.5* 1.3* 1.3*   No results for input(s): LIPASE, AMYLASE in the last 168 hours. No results for input(s): AMMONIA in the last 168 hours.  CBC: Recent Labs  Lab 04/30/20 0827 05/01/20 0830 05/02/20 0945 05/04/20 0543 05/05/20 0831  WBC 29.8* 27.4* 23.1* 11.0* 7.2  NEUTROABS  --  25.1* 21.6* 9.4* 5.9  HGB 8.4* 6.9* 7.2* 6.7* 6.8*  HCT 22.9* 19.3* 21.4* 19.5* 20.6*  MCV 86.7 88.1 90.7 89.9 91.6  PLT 135* 151 152 162 216    Cardiac Enzymes: No results for input(s): CKTOTAL, CKMB, CKMBINDEX, TROPONINI in the last 168 hours.  BNP: Invalid input(s): POCBNP  CBG: Recent Labs  Lab 05/03/20 0844  GLUCAP 67    Microbiology: Results for orders placed or performed during the hospital encounter of 04/14/20  SARS Coronavirus 2 by RT PCR (hospital order, performed in [facility] hospital lab) Nasopharyngeal Nasopharyngeal Swab     Status: None   Collection Time: 04/14/20  2:17 AM   Specimen: Nasopharyngeal Swab  Result Value Ref Range Status   SARS Coronavirus 2 NEGATIVE NEGATIVE Final    Comment: (NOTE) SARS-CoV-2 target nucleic acids are NOT DETECTED.  The SARS-CoV-2 RNA is generally detectable in upper and lower respiratory specimens during the acute phase of infection. The lowest concentration of SARS-CoV-2 viral copies this assay can detect is 250 copies / mL. A negative result does not preclude SARS-CoV-2 infection and should not be used as the sole basis for treatment or other patient management decisions.  A negative result may occur with improper specimen collection / handling, submission of specimen other than nasopharyngeal swab, presence of viral mutation(s) within the areas targeted by this assay, and inadequate number of viral copies (<250 copies / mL). A negative result must be combined with clinical observations, patient history, and  epidemiological information.  Fact Sheet for Patients:   StrictlyIdeas.no  Fact Sheet for Healthcare Providers: BankingDealers.co.za  This test is not yet approved or  cleared by the [place] FDA and has been authorized for detection and/or diagnosis of SARS-CoV-2 by FDA under an Emergency Use Authorization (EUA).  This EUA will remain in effect (meaning this test can be used) for the duration of the COVID-19 declaration under Section 564(b)(1) of the Act, 21 U.S.C. section 360bbb-3(b)(1), unless the authorization is terminated or revoked sooner.  Performed at [facility], [address] [place], [place] [id number]   CULTURE, BLOOD (ROUTINE X 2) w Reflex to ID Panel     Status: None   Collection Time: 04/14/20  8:14 AM   Specimen: BLOOD  Result Value Ref Range Status   Specimen Description BLOOD LEFT [facility]  Final   Special Requests   Final    BOTTLES DRAWN AEROBIC AND ANAEROBIC Blood Culture adequate volume   Culture   Final    NO GROWTH 5 DAYS Performed at [facility], [address] [place], [place] [address]    Report Status 04/19/2020 FINAL  Final  CULTURE, BLOOD (ROUTINE X 2) w Reflex to ID Panel     Status: None   Collection Time: 04/14/20  8:14 AM   Specimen: BLOOD  Result Value Ref Range Status   Specimen Description BLOOD LEFT WRIST  Final   Special Requests   Final    BOTTLES DRAWN AEROBIC AND ANAEROBIC Blood Culture adequate volume   Culture   Final    NO GROWTH 5 DAYS Performed at [facility], [address] [place], [place] [address]    Report Status 04/19/2020 FINAL  Final  Aerobic/Anaerobic Culture (surgical/deep wound)     Status: None   Collection Time: 04/14/20 11:14 AM   Specimen: Abscess  Result Value Ref Range Status   Specimen Description   Final    ABSCESS Performed at [facility], [address] [place], [place] [address]    Special Requests   Final     NONE Performed at [facility], [address] [place],  [address]    Gram Stain   Final    NO WBC SEEN ABUNDANT GRAM NEGATIVE RODS FEW GRAM POSITIVE COCCI FEW GRAM POSITIVE RODS Performed at [facility], [address] [place], [place] [address]    Culture   Final    ABUNDANT KLEBSIELLA PNEUMONIAE ABUNDANT PSEUDOMONAS AERUGINOSA MODERATE ESCHERICHIA COLI Confirmed Extended Spectrum Beta-Lactamase Producer (ESBL).  In bloodstream infections from ESBL organisms, carbapenems are preferred over piperacillin/tazobactam. They are shown to have a lower risk of mortality. MIXED ANAEROBIC FLORA PRESENT.  CALL LAB IF FURTHER IID REQUIRED.    Report Status 04/20/2020 FINAL  Final   Organism ID, Bacteria KLEBSIELLA PNEUMONIAE  Final   Organism ID, Bacteria PSEUDOMONAS AERUGINOSA  Final   Organism ID, Bacteria ESCHERICHIA COLI  Final      Susceptibility   Escherichia coli - MIC*    AMPICILLIN >=32 RESISTANT Resistant     CEFAZOLIN >=64 RESISTANT Resistant     CEFEPIME 16 RESISTANT Resistant     CEFTAZIDIME RESISTANT Resistant     CEFTRIAXONE >=64 RESISTANT Resistant     CIPROFLOXACIN >=4 RESISTANT Resistant     GENTAMICIN <=1 SENSITIVE Sensitive     IMIPENEM <=0.25 SENSITIVE Sensitive     TRIMETH/SULFA >=320 RESISTANT Resistant     AMPICILLIN/SULBACTAM >=32 RESISTANT Resistant     PIP/TAZO 8 SENSITIVE Sensitive     * MODERATE ESCHERICHIA COLI   Klebsiella pneumoniae - MIC*    AMPICILLIN >=32 RESISTANT Resistant     CEFAZOLIN <=4 SENSITIVE Sensitive     CEFEPIME <=0.12 SENSITIVE Sensitive     CEFTAZIDIME <=1 SENSITIVE Sensitive     CEFTRIAXONE <=0.25 SENSITIVE Sensitive     CIPROFLOXACIN <=0.25 SENSITIVE Sensitive     GENTAMICIN <=1 SENSITIVE Sensitive     IMIPENEM <=0.25 SENSITIVE Sensitive     TRIMETH/SULFA <=20 SENSITIVE Sensitive     AMPICILLIN/SULBACTAM 16 INTERMEDIATE Intermediate     PIP/TAZO 8 SENSITIVE Sensitive     * ABUNDANT KLEBSIELLA  PNEUMONIAE   Pseudomonas aeruginosa - MIC*    CEFTAZIDIME 4 SENSITIVE Sensitive     CIPROFLOXACIN <=0.25 SENSITIVE Sensitive     GENTAMICIN 2 SENSITIVE Sensitive     IMIPENEM 2 SENSITIVE Sensitive     PIP/TAZO 8 SENSITIVE Sensitive     CEFEPIME 4 SENSITIVE Sensitive     * ABUNDANT PSEUDOMONAS AERUGINOSA  MRSA PCR Screening     Status: None   Collection Time: 04/14/20  6:19 PM   Specimen: Nasopharyngeal  Result Value Ref Range Status   MRSA by PCR NEGATIVE NEGATIVE Final    Comment:        The GeneXpert MRSA Assay (FDA approved for NASAL specimens only), is one component of a comprehensive MRSA colonization surveillance program. It is not intended to diagnose MRSA infection nor to guide or monitor treatment for MRSA infections. Performed at [facility], [address] [place], [place] [address]   Urine Culture     Status: None   Collection Time: 04/15/20  4:28 AM   Specimen: Urine, Random  Result Value Ref Range Status   Specimen Description   Final    URINE, RANDOM Performed at [facility], [address], [place], [place] [address]    Special Requests   Final    NONE Performed at [facility], [address], [place], [place] [address]    Culture   Final    NO GROWTH Performed at [facility], [place] [address] [place], [place] [address]    Report Status 04/16/2020 FINAL  Final  CULTURE, BLOOD (ROUTINE X 2) w Reflex to ID Panel     Status: None   Collection Time: 04/22/20  6:12 PM   Specimen: BLOOD  Result Value Ref Range Status   Specimen Description BLOOD FOOT  Final   Special Requests   Final    BOTTLES DRAWN AEROBIC AND ANAEROBIC Blood Culture adequate volume   Culture   Final    NO GROWTH 5 DAYS Performed at [facility], [address] [place], [place] [address]    Report Status 04/27/2020 FINAL  Final  CULTURE, BLOOD (ROUTINE X 2) w Reflex to ID Panel     Status: None   Collection Time: 04/22/20  6:17 PM    Specimen: BLOOD  Result Value Ref Range Status   Specimen Description BLOOD FOOT  Final   Special Requests   Final    BOTTLES DRAWN AEROBIC AND ANAEROBIC Blood Culture results may not be optimal due to an excessive volume of blood received in culture bottles   Culture   Final    NO GROWTH 5 DAYS Performed at [facility], [address] [place], [place] [address]    Report Status 04/27/2020 FINAL  Final  CULTURE, BLOOD (ROUTINE X 2) w Reflex to ID Panel     Status: None (Preliminary result)   Collection Time: 05/01/20  2:50 PM   Specimen: BLOOD  Result Value Ref Range Status   Specimen Description BLOOD BLOOD LEFT HAND  Final   Special Requests   Final    BOTTLES DRAWN AEROBIC AND ANAEROBIC Blood Culture adequate volume   Culture   Final    NO GROWTH 4 DAYS Performed at [facility], [address], [place], [place] [address]    Report Status PENDING  Incomplete  CULTURE, BLOOD (ROUTINE X 2) w Reflex to ID Panel     Status: None (Preliminary result)   Collection Time: 05/01/20  2:51 PM   Specimen: BLOOD  Result Value Ref Range Status   Specimen Description BLOOD BLOOD RIGHT HAND  Final   Special Requests   Final    BOTTLES DRAWN AEROBIC AND ANAEROBIC Blood Culture adequate volume   Culture   Final    NO GROWTH 4 DAYS Performed at [facility], [address] [place], [place] [address]    Report Status PENDING  Incomplete    Coagulation Studies: No results for input(s): LABPROT, INR in the last 72 hours.  Urinalysis: No results for input(s): COLORURINE, LABSPEC, PHURINE, GLUCOSEU, HGBUR, BILIRUBINUR, KETONESUR, PROTEINUR, UROBILINOGEN, NITRITE, LEUKOCYTESUR in the last 72 hours.  Invalid input(s): APPERANCEUR    Imaging: DG Chest Port 1 View  Result Date: 05/05/2020 CLINICAL DATA:  Fever. EXAM: PORTABLE CHEST 1 VIEW COMPARISON:  April 28, 2020. FINDINGS: The heart size and mediastinal contours are within normal limits. No pneumothorax  is noted. Right internal jugular dialysis catheter is unchanged in position. Hypoinflation of the lungs is noted with mild bibasilar subsegmental atelectasis. Small pleural effusions may be present. Left perihilar opacity is noted concerning for atelectasis or possibly infiltrate. The visualized skeletal structures are unremarkable. IMPRESSION: Hypoinflation of the lungs is noted with mild bibasilar subsegmental atelectasis. Left perihilar opacity is noted concerning for atelectasis or possibly infiltrate. Electronically Signed   By: Marijo Conception M.D.   On: 05/05/2020 08:28     Medications:   . sodium chloride 250 mL (05/03/20 0043)  . anidulafungin 100 mg (05/04/20 2129)  . [START ON 05/06/2020] meropenem (MERREM) IV     . (feeding supplement) PROSource Plus  30 mL Oral TID BM  . acidophilus  1 capsule Oral Daily  .  alteplase  2 mg Intracatheter Once  . alteplase  2 mg Intracatheter Once  . amiodarone  200 mg Oral BID  . Chlorhexidine Gluconate Cloth  6 each Topical Daily  . docusate sodium  200 mg Oral BID  . epoetin (EPOGEN/PROCRIT) injection  10,000 Units Intravenous Q M,W,F-HD  . famotidine  10 mg Oral Q1200  . feeding supplement (NEPRO CARB STEADY)  237 mL Oral TID BM  . guaiFENesin-dextromethorphan  10 mL Oral Q8H  . heparin injection (subcutaneous)  5,000 Units Subcutaneous Q8H  . magnesium oxide  400 mg Oral Daily  . mometasone-formoterol  2 puff Inhalation BID  . multivitamin  1 tablet Oral QHS  . pregabalin  25 mg Oral Daily  . sodium chloride flush  5 mL Intracatheter Q8H  . torsemide  40 mg Oral Daily   acetaminophen **OR** acetaminophen, albuterol, ALPRAZolam, bisacodyl, diphenhydrAMINE, heparin NICU/SCN flush, HYDROmorphone (DILAUDID) injection, iohexol, ipratropium-albuterol, labetalol, magic mouthwash **AND** lidocaine, magnesium hydroxide, menthol-cetylpyridinium, oxyCODONE-acetaminophen, phenol, simethicone  Assessment/ Plan:  Melanie Bowen is a 59 y.o.  black female with HIV, COPD, hypertension, GERD, who is admitted to [facility] on 04/14/2020 for Dyspnea [R06.00] Colonic diverticular abscess [K57.20] Diverticulitis of large intestine with abscess without bleeding [K57.20] Abscess of sigmoid colon due to diverticulitis [K57.20]  #Acute renal failure   No history of chronic kidney disease.   Requiring hemodialysis. First treatment on 8/18.  IV contrast exposure on 8/16. Ultrasound -negative for obstruction or mass.   Do not suspect HIV induced nephropathy  Outpatient chair at [facility] MWF schedule.   -Electrolytes and volume status are acceptable at present -We will hold hemodialysis today and watch for renal recovery   #. Anemia of kidney failure:  Lab Results  Component Value Date   HGB 6.8 (L) 05/05/2020   -Continue Epogen with dialysis treatments - Low threshold for PRBC transfusion.   #Lower extremity edema Start oral torsemide  #Fever Temperature 101.2 noted today Wbc counts trending lower Monitor for sepsis    LOS: 21 Susa Bones 9/6/20211:09 PM

## 2020-05-05 NOTE — Progress Notes (Signed)
Dr. Mal Misty returned page. Per md okay to administer blood with temp of 101.2

## 2020-05-05 NOTE — Consult Note (Signed)
Pharmacy Antibiotic Note  Melanie Bowen is a 59 y.o. female admitted on 04/14/2020 with intra-abdominal infection - decompensating on Zosyn. Source of infection - perforated diverticulitis and peridiverticular abscess Pharmacy has been consulted for Meropenem dosing.  Pt was receiving HD for acute renal failure 8/18-8/19. Cath was pulled due to infection and thrombosis concern. HD restarted 9/1  8/16 wound cx: ABUNDANT KLEBSIELLA PNEUMONIAE  ABUNDANT PSEUDOMONAS AERUGINOSA  MODERATE ESCHERICHIA COLI  MIXED ANAEROBIC FLORA PRESENT.  (Klebsiella, E Coli, and pseudomonas sensitive to imipenem)  ID following   Plan: adjust meropenem to 500 mg IV every 24 hours based on HD dosing   Height: 5\' 6"  (167.6 cm) Weight: 130.9 kg (288 lb 8 oz) IBW/kg (Calculated) : 59.3  Temp (24hrs), Avg:99.1 F (37.3 C), Min:98.2 F (36.8 C), Max:100.6 F (38.1 C)  Recent Labs  Lab 04/30/20 0827 05/01/20 0740 05/01/20 0830 05/02/20 0945 05/04/20 0543 05/05/20 0831  WBC 29.8*  --  27.4* 23.1* 11.0* 7.2  CREATININE 5.23* 3.93*  --  4.71* 4.15* 4.66*    Estimated Creatinine Clearance: 18.3 mL/min (A) (by C-G formula based on SCr of 4.66 mg/dL (H)).     Antimicrobials this admission: valacyclovir 8/16>>8/17 ceftriaxone/metronidazole 8/16>8/17 Zosyn 8/16 >>8/17 Levaquin x 1 8/20 anidulafungin 8/1/7 >>8/24 meropenem 8/17 >>    Thank you for allowing pharmacy to be a part of this patient's care.  Dallie Piles, PharmD 05/05/2020 10:13 AM

## 2020-05-05 NOTE — Progress Notes (Signed)
Progress Note    Melanie Bowen  LKG:[id number] DOB: 1961/05/14  DOA: 04/14/2020 PCP: Theotis Burrow, MD      Brief Narrative:    Medical records reviewed and are as summarized below:  Melanie Bowen is a 59 y.o. female       Assessment/Plan:   Principal Problem:   Severe sepsis with septic shock ([place]) Active Problems:   HTN (hypertension)   HIV (human immunodeficiency virus infection) ([place])   Diverticulitis of large intestine with abscess without bleeding   Abscess of sigmoid colon due to diverticulitis   Acute renal failure (ARF) ([place])   Hypotension   Diverticulosis   Acute respiratory distress   DNR (do not resuscitate) discussion   Palliative care by specialist   Dyspnea    Nutrition Problem: Increased nutrient needs Etiology: chronic illness (COPD, new HD)  Signs/Symptoms: estimated needs   Body mass index is 46.57 kg/m.  (Morbid obesity): This complicates overall care and prognosis   Septic shock - resolved  Acute sigmoid diverticulitis and diverticular abscess, significant leukocytosis- s/p CT-guided drainage by IR on 04/14/2020.  Fluid culture showed Klebsiella pneumonia, pseudomonas aeruginosa and E. coli.  Acute kidney injury complicated by hyperkalemia and metabolic acidosis  Atrial fibrillation with RVR   Acute anemia, likely multifactorial poor oral intake and acute illness (no evidence of bleeding thus far)  Sinus tachycardia  COPD exacerbation-resolved  HIV infection  Acute liver failure, leukocytosis-resolved  Acute hypoxemic respiratory failure   Acute metabolic encephalopathy/delirium -recurrent.  No acute abnormality on CT head  History of stroke  PLAN  Transfuse 1 unit of PRBCs as ordered for hemoglobin of 6.8.  S/p transfusion with 1 unit of PRBCs on 05/01/2020.  Continue to monitor H&H and transfuse as needed. IV meropenem has been discontinued Continue anidulafungin.  Follow-up with ID for further  recommendations. Chest x-ray was done today because of fever and it showed left perihilar opacity, hypoinflation of the lungs with mild bibasilar subsegmental atelectasis. Continue antiretroviral therapy Continue bronchodilators Continue amiodarone.  Consideration for long-term anticoagulation in the future Turn patient every 2 hours.     Diet Order            Diet renal with fluid restriction Room service appropriate? Yes; Fluid consistency: Thin  Diet effective now                    Consultants:  General surgeon  Cardiologist  Nephrologist  Vascular surgeon  Procedures:  CT-guided drainage of diverticular abscess on 04/14/2020    Medications:   . (feeding supplement) PROSource Plus  30 mL Oral TID BM  . acidophilus  1 capsule Oral Daily  . alteplase  2 mg Intracatheter Once  . alteplase  2 mg Intracatheter Once  . amiodarone  200 mg Oral BID  . Chlorhexidine Gluconate Cloth  6 each Topical Daily  . docusate sodium  200 mg Oral BID  . epoetin (EPOGEN/PROCRIT) injection  10,000 Units Intravenous Q M,W,F-HD  . famotidine  10 mg Oral Q1200  . feeding supplement (NEPRO CARB STEADY)  237 mL Oral TID BM  . guaiFENesin-dextromethorphan  10 mL Oral Q8H  . heparin injection (subcutaneous)  5,000 Units Subcutaneous Q8H  . magnesium oxide  400 mg Oral Daily  . mometasone-formoterol  2 puff Inhalation BID  . multivitamin  1 tablet Oral QHS  . pregabalin  25 mg Oral Daily  . sodium chloride flush  5 mL Intracatheter Q8H  .  torsemide  40 mg Oral Daily   Continuous Infusions: . sodium chloride 250 mL (05/03/20 0043)  . anidulafungin Stopped (05/04/20 2309)     Anti-infectives (From admission, onward)   Start     Dose/Rate Route Frequency Ordered Stop   05/06/20 0956  meropenem (MERREM) 500 mg in sodium chloride 0.9 % 100 mL IVPB  Status:  Discontinued        500 mg 200 mL/hr over 30 Minutes Intravenous Every 24 hours 05/05/20 1018 05/05/20 1637   05/03/20 2000   anidulafungin (ERAXIS) 100 mg in sodium chloride 0.9 % 100 mL IVPB        100 mg 78 mL/hr over 100 Minutes Intravenous Every 24 hours 05/02/20 2223     05/02/20 2330  anidulafungin (ERAXIS) 200 mg in sodium chloride 0.9 % 200 mL IVPB        200 mg 78 mL/hr over 200 Minutes Intravenous  Once 05/02/20 2223 05/03/20 0455   04/30/20 0800  rilpivirine (EDURANT) tablet 25 mg  Status:  Discontinued       "And" Linked Group Details   25 mg Oral Daily with breakfast 04/29/20 1254 05/02/20 2223   04/30/20 0800  dolutegravir (TIVICAY) tablet 50 mg  Status:  Discontinued       "And" Linked Group Details   50 mg Oral Daily with breakfast 04/29/20 1254 05/02/20 2223   04/28/20 2200  meropenem (MERREM) 500 mg in sodium chloride 0.9 % 100 mL IVPB  Status:  Discontinued        500 mg 200 mL/hr over 30 Minutes Intravenous Every 12 hours 04/28/20 1546 05/05/20 1018   04/26/20 2200  meropenem (MERREM) 500 mg in sodium chloride 0.9 % 100 mL IVPB  Status:  Discontinued        500 mg 200 mL/hr over 30 Minutes Intravenous Every 8 hours 04/26/20 1258 04/28/20 1546   04/25/20 1700  rilpivirine (EDURANT) tablet 25 mg  Status:  Discontinued        25 mg Oral Daily with breakfast 04/24/20 1636 04/25/20 1111   04/25/20 1200  rilpivirine (EDURANT) tablet 25 mg  Status:  Discontinued        25 mg Oral Daily with breakfast 04/25/20 1111 04/29/20 1254   04/25/20 1000  meropenem (MERREM) 500 mg in sodium chloride 0.9 % 100 mL IVPB  Status:  Discontinued        500 mg 200 mL/hr over 30 Minutes Intravenous Every 12 hours 04/25/20 0935 04/26/20 1258   04/24/20 1700  darunavir-cobicistat (PREZCOBIX) 800-150 MG per tablet 1 tablet  Status:  Discontinued        1 tablet Oral Daily with breakfast 04/24/20 1231 04/24/20 1636   04/24/20 1330  dolutegravir (TIVICAY) tablet 50 mg  Status:  Discontinued        50 mg Oral Daily 04/24/20 1231 04/29/20 1254   04/23/20 2200  meropenem (MERREM) 500 mg in sodium chloride 0.9 % 100 mL IVPB   Status:  Discontinued        500 mg 200 mL/hr over 30 Minutes Intravenous Every 24 hours 04/23/20 1127 04/25/20 0935   04/18/20 2000  levofloxacin (LEVAQUIN) IVPB 750 mg        750 mg 100 mL/hr over 90 Minutes Intravenous  Once 04/18/20 1857 04/19/20 0004   04/18/20 1800  levofloxacin (LEVAQUIN) IVPB 750 mg  Status:  Discontinued        750 mg 100 mL/hr over 90 Minutes Intravenous  Once 04/18/20 1640 04/18/20 1857  04/16/20 2200  anidulafungin (ERAXIS) 100 mg in sodium chloride 0.9 % 100 mL IVPB        100 mg 78 mL/hr over 100 Minutes Intravenous Every 24 hours 04/16/20 1707 04/22/20 0211   04/15/20 2200  valACYclovir (VALTREX) tablet 500 mg  Status:  Discontinued        500 mg Oral Daily at bedtime 04/15/20 1248 04/16/20 1259   04/15/20 2200  anidulafungin (ERAXIS) 200 mg in sodium chloride 0.9 % 200 mL IVPB        200 mg 78 mL/hr over 200 Minutes Intravenous  Once 04/15/20 2008 04/16/20 0327   04/15/20 2200  meropenem (MERREM) 1 g in sodium chloride 0.9 % 100 mL IVPB  Status:  Discontinued        1 g 200 mL/hr over 30 Minutes Intravenous Every 12 hours 04/15/20 2017 04/23/20 1127   04/15/20 1400  piperacillin-tazobactam (ZOSYN) IVPB 3.375 g  Status:  Discontinued        3.375 g 12.5 mL/hr over 240 Minutes Intravenous Every 8 hours 04/15/20 1300 04/15/20 2006   04/14/20 2200  elvitegravir-cobicistat-emtricitabine-tenofovir (GENVOYA) 150-150-200-10 MG tablet 1 tablet  Status:  Discontinued        1 tablet Oral Daily at bedtime 04/14/20 0235 04/15/20 0957   04/14/20 2200  valACYclovir (VALTREX) tablet 1,000 mg  Status:  Discontinued        1,000 mg Oral Daily at bedtime 04/14/20 0235 04/15/20 1248   04/14/20 1230  cefTRIAXone (ROCEPHIN) 2 g in sodium chloride 0.9 % 100 mL IVPB  Status:  Discontinued        2 g 200 mL/hr over 30 Minutes Intravenous Every 24 hours 04/14/20 1223 04/15/20 1300   04/14/20 1230  metroNIDAZOLE (FLAGYL) IVPB 500 mg  Status:  Discontinued        500 mg 100  mL/hr over 60 Minutes Intravenous Every 8 hours 04/14/20 1223 04/15/20 1636   04/14/20 1030  piperacillin-tazobactam (ZOSYN) IVPB 3.375 g  Status:  Discontinued        3.375 g 12.5 mL/hr over 240 Minutes Intravenous Every 8 hours 04/14/20 0235 04/14/20 1223   04/14/20 0215  piperacillin-tazobactam (ZOSYN) IVPB 4.5 g  Status:  Discontinued        4.5 g 200 mL/hr over 30 Minutes Intravenous  Once 04/14/20 0200 04/14/20 0208   04/14/20 0215  piperacillin-tazobactam (ZOSYN) IVPB 3.375 g        3.375 g 100 mL/hr over 30 Minutes Intravenous  Once 04/14/20 0208 04/14/20 0301             Family Communication/Anticipated D/C date and plan/Code Status   DVT prophylaxis: heparin injection 5,000 Units Start: 04/23/20 0800 because of high risk for DVT     Code Status: Partial Code  Family Communication: Plan discussed with patient Disposition Plan:    Status is: Inpatient  Remains inpatient appropriate because:Inpatient level of care appropriate due to severity of illness and Requiring hemodialysis for acute kidney injury   Dispo: The patient is from: Home              Anticipated d/c is to: SNF              Anticipated d/c date is: > 3 days              Patient currently is not medically stable to d/c.           Subjective:   Interval events noted.  According to her nurse, patient  did not get the blood yesterday. She had a fever this morning with T-max of 101.6.  Objective:    Vitals:   05/05/20 1235 05/05/20 1302 05/05/20 1314 05/05/20 1610  BP: 125/63 120/62  122/61  Pulse: (!) 103 (!) 111  (!) 101  Resp: (!) 26  (!) 22 (!) 21  Temp: 99.4 F (37.4 C) 98.9 F (37.2 C)  98.7 F (37.1 C)  TempSrc: Oral Axillary  Oral  SpO2: 100% 98%  100%  Weight:      Height:       No data found.   Intake/Output Summary (Last 24 hours) at 05/05/2020 1643 Last data filed at 05/05/2020 1615 Gross per 24 hour  Intake 970.56 ml  Output 340 ml  Net 630.56 ml   Filed Weights    04/29/20 0500 05/02/20 0358 05/03/20 0547  Weight: 134.4 kg 135.2 kg 130.9 kg    Exam:  GEN: No acute distress SKIN: Raw areas on bilateral buttocks and bilateral thighs EYES: EOMI ENT: MMM CV: Regular rate, slightly tachycardic PULM: No rales, no wheezing heard ABD: Soft, distended, nontender, +LLQ JP drain with purulent fluid CNS: Lethargic but arousable EXT: Bilateral leg pitting edema, no tenderness     Data Reviewed:   I have personally reviewed following labs and imaging studies:  Labs: Labs show the following:   Basic Metabolic Panel: Recent Labs  Lab 04/30/20 0827 04/30/20 0827 05/01/20 0740 05/01/20 0740 05/02/20 0945 05/02/20 0945 05/03/20 0636 05/04/20 0543 05/05/20 0831  NA 137  --  134*  --  134*  --   --  136 139  K 5.9*   < > 4.1   < > 4.2   < >  --  3.7 3.9  CL 101  --  96*  --  97*  --   --  98 101  CO2 15*  --  25  --  24  --   --  25 26  GLUCOSE 64*  --  73  --  76  --   --  93 103*  BUN 91*  --  55*  --  67*  --   --  52* 66*  CREATININE 5.23*  --  3.93*  --  4.71*  --   --  4.15* 4.66*  CALCIUM 7.3*  --  7.6*  --  7.8*  --   --  7.6* 7.5*  MG 1.7   < > 1.7  --  1.8  --  1.6* 1.8 1.9  PHOS 8.5*  --  6.2*  --  7.0*  --   --  5.8* 6.4*   < > = values in this interval not displayed.   GFR Estimated Creatinine Clearance: 18.3 mL/min (A) (by C-G formula based on SCr of 4.66 mg/dL (H)). Liver Function Tests: Recent Labs  Lab 05/01/20 0705 05/02/20 0945 05/03/20 0636 05/04/20 0543 05/05/20 0831  AST 28  --  27  --   --   ALT 17  --  18  --   --   ALKPHOS 97  --  101  --   --   BILITOT 1.4*  --  1.4*  --   --   PROT 5.3*  --  5.8*  --   --   ALBUMIN 1.4* 1.4* 1.5* 1.3* 1.3*   No results for input(s): LIPASE, AMYLASE in the last 168 hours. No results for input(s): AMMONIA in the last 168 hours. Coagulation profile No results for input(s): INR, PROTIME in  the last 168 hours.  CBC: Recent Labs  Lab 04/30/20 0827 05/01/20 0830  05/02/20 0945 05/04/20 0543 05/05/20 0831  WBC 29.8* 27.4* 23.1* 11.0* 7.2  NEUTROABS  --  25.1* 21.6* 9.4* 5.9  HGB 8.4* 6.9* 7.2* 6.7* 6.8*  HCT 22.9* 19.3* 21.4* 19.5* 20.6*  MCV 86.7 88.1 90.7 89.9 91.6  PLT 135* 151 152 162 216   Cardiac Enzymes: No results for input(s): CKTOTAL, CKMB, CKMBINDEX, TROPONINI in the last 168 hours. BNP (last 3 results) No results for input(s): PROBNP in the last 8760 hours. CBG: Recent Labs  Lab 05/03/20 0844  GLUCAP 78   D-Dimer: No results for input(s): DDIMER in the last 72 hours. Hgb A1c: No results for input(s): HGBA1C in the last 72 hours. Lipid Profile: No results for input(s): CHOL, HDL, LDLCALC, TRIG, CHOLHDL, LDLDIRECT in the last 72 hours. Thyroid function studies: No results for input(s): TSH, T4TOTAL, T3FREE, THYROIDAB in the last 72 hours.  Invalid input(s): FREET3 Anemia work up: No results for input(s): VITAMINB12, FOLATE, FERRITIN, TIBC, IRON, RETICCTPCT in the last 72 hours. Sepsis Labs: Recent Labs  Lab 05/01/20 0830 05/02/20 0945 05/04/20 0543 05/05/20 0831  WBC 27.4* 23.1* 11.0* 7.2    Microbiology Recent Results (from the past 240 hour(s))  CULTURE, BLOOD (ROUTINE X 2) w Reflex to ID Panel     Status: None (Preliminary result)   Collection Time: 05/01/20  2:50 PM   Specimen: BLOOD  Result Value Ref Range Status   Specimen Description BLOOD BLOOD LEFT HAND  Final   Special Requests   Final    BOTTLES DRAWN AEROBIC AND ANAEROBIC Blood Culture adequate volume   Culture   Final    NO GROWTH 4 DAYS Performed at [facility], [address] [place], [place] [address]    Report Status PENDING  Incomplete  CULTURE, BLOOD (ROUTINE X 2) w Reflex to ID Panel     Status: None (Preliminary result)   Collection Time: 05/01/20  2:51 PM   Specimen: BLOOD  Result Value Ref Range Status   Specimen Description BLOOD BLOOD RIGHT HAND  Final   Special Requests   Final    BOTTLES DRAWN AEROBIC AND ANAEROBIC  Blood Culture adequate volume   Culture   Final    NO GROWTH 4 DAYS Performed at [facility], [address], [place], [place] [address]    Report Status PENDING  Incomplete    Procedures and diagnostic studies:  DG Chest Port 1 View  Result Date: 05/05/2020 CLINICAL DATA:  Fever. EXAM: PORTABLE CHEST 1 VIEW COMPARISON:  April 28, 2020. FINDINGS: The heart size and mediastinal contours are within normal limits. No pneumothorax is noted. Right internal jugular dialysis catheter is unchanged in position. Hypoinflation of the lungs is noted with mild bibasilar subsegmental atelectasis. Small pleural effusions may be present. Left perihilar opacity is noted concerning for atelectasis or possibly infiltrate. The visualized skeletal structures are unremarkable. IMPRESSION: Hypoinflation of the lungs is noted with mild bibasilar subsegmental atelectasis. Left perihilar opacity is noted concerning for atelectasis or possibly infiltrate. Electronically Signed   By: Marijo Conception M.D.   On: 05/05/2020 08:28               LOS: 21 days   [place] [facility]   Pager on [email or web address]. If 7PM-7AM, please contact night-coverage at [email or web address]     05/05/2020, 4:43 PM

## 2020-05-05 NOTE — Progress Notes (Signed)
Per dr. Mal Misty okay to administered scheduled sq heparin with hemoglobin of 6.8. will continue to monitor

## 2020-05-05 NOTE — Progress Notes (Signed)
Made dr. Mal Misty aware temp remains elevated 101.2 after tylenol. Questioning when to give blood. Waiting for md to return page

## 2020-05-05 NOTE — Progress Notes (Signed)
Made dr.ayiku aware patients temp is 101.6. prn tylenol given will continue to monitor

## 2020-05-05 NOTE — Progress Notes (Addendum)
Made aware by ccmd patients heart rate sustaining 130-140. Upon entering room patient resting in bed with eyes closed, aroused by voice. Made dr. Mal Misty aware nothing is ordered to give prn. Will continue to monitor

## 2020-05-05 NOTE — Progress Notes (Signed)
PT Cancellation Note  Patient Details Name: Melanie Bowen MRN: [id number] DOB: 01-15-1961   Cancelled Treatment:    Reason Eval/Treat Not Completed: Medical issues which prohibited therapy. Patient currently with hemoglobin of 6.8 and getting blood transfusion. PT will follow up as appropriate.   Minna Merritts, PT, MPT   Percell Locus 05/05/2020, 3:23 PM

## 2020-05-06 DIAGNOSIS — Z7189 Other specified counseling: Secondary | ICD-10-CM

## 2020-05-06 LAB — CBC WITH DIFFERENTIAL/PLATELET
Abs Immature Granulocytes: 0.04 10*3/uL (ref 0.00–0.07)
Basophils Absolute: 0 10*3/uL (ref 0.0–0.1)
Basophils Relative: 1 %
Eosinophils Absolute: 0.2 10*3/uL (ref 0.0–0.5)
Eosinophils Relative: 4 %
HCT: 23.3 % — ABNORMAL LOW (ref 36.0–46.0)
Hemoglobin: 7.8 g/dL — ABNORMAL LOW (ref 12.0–15.0)
Immature Granulocytes: 1 %
Lymphocytes Relative: 9 %
Lymphs Abs: 0.5 10*3/uL — ABNORMAL LOW (ref 0.7–4.0)
MCH: 30 pg (ref 26.0–34.0)
MCHC: 33.5 g/dL (ref 30.0–36.0)
MCV: 89.6 fL (ref 80.0–100.0)
Monocytes Absolute: 0.5 10*3/uL (ref 0.1–1.0)
Monocytes Relative: 9 %
Neutro Abs: 4.1 10*3/uL (ref 1.7–7.7)
Neutrophils Relative %: 76 %
Platelets: 274 10*3/uL (ref 150–400)
RBC: 2.6 MIL/uL — ABNORMAL LOW (ref 3.87–5.11)
RDW: 17 % — ABNORMAL HIGH (ref 11.5–15.5)
Smear Review: NORMAL
WBC: 5.4 10*3/uL (ref 4.0–10.5)
nRBC: 0.7 % — ABNORMAL HIGH (ref 0.0–0.2)

## 2020-05-06 LAB — TYPE AND SCREEN
ABO/RH(D): O POS
Antibody Screen: NEGATIVE
Unit division: 0
Unit division: 0

## 2020-05-06 LAB — BLOOD GAS, ARTERIAL
Acid-Base Excess: 3.3 mmol/L — ABNORMAL HIGH (ref 0.0–2.0)
Bicarbonate: 27.8 mmol/L (ref 20.0–28.0)
FIO2: 0.28
O2 Saturation: 93.7 %
Patient temperature: 37
pCO2 arterial: 41 mmHg (ref 32.0–48.0)
pH, Arterial: 7.44 (ref 7.350–7.450)
pO2, Arterial: 67 mmHg — ABNORMAL LOW (ref 83.0–108.0)

## 2020-05-06 LAB — CULTURE, BLOOD (ROUTINE X 2)
Culture: NO GROWTH
Culture: NO GROWTH
Special Requests: ADEQUATE
Special Requests: ADEQUATE

## 2020-05-06 LAB — BPAM RBC
Blood Product Expiration Date: 202110072359
Blood Product Expiration Date: 202110082359
ISSUE DATE / TIME: 202109061113
ISSUE DATE / TIME: 202109061241
Unit Type and Rh: 5100
Unit Type and Rh: 5100

## 2020-05-06 LAB — RENAL FUNCTION PANEL
Albumin: 1.3 g/dL — ABNORMAL LOW (ref 3.5–5.0)
Anion gap: 13 (ref 5–15)
BUN: 72 mg/dL — ABNORMAL HIGH (ref 6–20)
CO2: 27 mmol/L (ref 22–32)
Calcium: 7.7 mg/dL — ABNORMAL LOW (ref 8.9–10.3)
Chloride: 99 mmol/L (ref 98–111)
Creatinine, Ser: 4.96 mg/dL — ABNORMAL HIGH (ref 0.44–1.00)
GFR calc Af Amer: 10 mL/min — ABNORMAL LOW (ref >60)
GFR calc non Af Amer: 9 mL/min — ABNORMAL LOW (ref >60)
Glucose, Bld: 101 mg/dL — ABNORMAL HIGH (ref 70–99)
Phosphorus: 7.5 mg/dL — ABNORMAL HIGH (ref 2.5–4.6)
Potassium: 4.1 mmol/L (ref 3.5–5.1)
Sodium: 139 mmol/L (ref 135–145)

## 2020-05-06 LAB — MAGNESIUM: Magnesium: 2.1 mg/dL (ref 1.7–2.4)

## 2020-05-06 NOTE — Progress Notes (Signed)
[facility]  ROUNDING NOTE   Subjective:   Patient appears lethargic, drowsy, but follows commands.No acute distress noted. She is supplementary O2 2L via nasal canula.  Objective:  Vital signs in last 24 hours:  Temp:  [98.4 F (36.9 C)-99.7 F (37.6 C)] 99.5 F (37.5 C) (09/07 1221) Pulse Rate:  [88-137] 94 (09/07 1221) Resp:  [18-24] 19 (09/07 1221) BP: (94-122)/(52-103) 94/54 (09/07 1221) SpO2:  [96 %-100 %] 97 % (09/07 1221)  Weight change:  Filed Weights   04/29/20 0500 05/02/20 0358 05/03/20 0547  Weight: 134.4 kg 135.2 kg 130.9 kg    Intake/Output: I/O last 3 completed shifts: In: 1116.6 [Blood:410; Other:0.5; IV Piggyback:706.1] Out: 275 [Urine:250; Drains:25]   Intake/Output this shift:  Total I/O In: 240 [P.O.:240] Out: 0   Physical Exam: General: In no acute distress, resting in bed with eyes closed  Lungs:  Fine crackles + bilaterally, diminished at the bases  Heart: S1S2, no rub or gallop  Abdomen:  Obese, nontender  Extremities:  Trace peripheral edema.  Neurologic:  Lethargic, follows commands  Skin  No acute lesions or rashes  Access: RIJ permcath 8/27 Dr. [name]    Basic Metabolic Panel: Recent Labs  Lab 05/01/20 0740 05/01/20 0740 05/02/20 0945 05/02/20 0945 05/03/20 0636 05/04/20 0543 05/05/20 0831 05/06/20 0701  NA 134*  --  134*  --   --  136 139 139  K 4.1  --  4.2  --   --  3.7 3.9 4.1  CL 96*  --  97*  --   --  98 101 99  CO2 25  --  24  --   --  25 26 27   GLUCOSE 73  --  76  --   --  93 103* 101*  BUN 55*  --  67*  --   --  52* 66* 72*  CREATININE 3.93*  --  4.71*  --   --  4.15* 4.66* 4.96*  CALCIUM 7.6*   < > 7.8*   < >  --  7.6* 7.5* 7.7*  MG 1.7   < > 1.8  --  1.6* 1.8 1.9 2.1  PHOS 6.2*  --  7.0*  --   --  5.8* 6.4* 7.5*   < > = values in this interval not displayed.    Liver Function Tests: Recent Labs  Lab 05/01/20 0705 05/01/20 0705 05/02/20 0945 05/03/20 0636 05/04/20 0543 05/05/20 0831  05/06/20 0701  AST 28  --   --  27  --   --   --   ALT 17  --   --  18  --   --   --   ALKPHOS 97  --   --  101  --   --   --   BILITOT 1.4*  --   --  1.4*  --   --   --   PROT 5.3*  --   --  5.8*  --   --   --   ALBUMIN 1.4*   < > 1.4* 1.5* 1.3* 1.3* 1.3*   < > = values in this interval not displayed.   No results for input(s): LIPASE, AMYLASE in the last 168 hours. No results for input(s): AMMONIA in the last 168 hours.  CBC: Recent Labs  Lab 05/01/20 0830 05/02/20 0945 05/04/20 0543 05/05/20 0831 05/06/20 0701  WBC 27.4* 23.1* 11.0* 7.2 5.4  NEUTROABS 25.1* 21.6* 9.4* 5.9 4.1  HGB 6.9* 7.2* 6.7* 6.8*  7.8*  HCT 19.3* 21.4* 19.5* 20.6* 23.3*  MCV 88.1 90.7 89.9 91.6 89.6  PLT 151 152 162 216 274    Cardiac Enzymes: No results for input(s): CKTOTAL, CKMB, CKMBINDEX, TROPONINI in the last 168 hours.  BNP: Invalid input(s): POCBNP  CBG: Recent Labs  Lab 05/03/20 0844  GLUCAP 78    Microbiology: Results for orders placed or performed during the hospital encounter of 04/14/20  SARS Coronavirus 2 by RT PCR (hospital order, performed in [facility] hospital lab) Nasopharyngeal Nasopharyngeal Swab     Status: None   Collection Time: 04/14/20  2:17 AM   Specimen: Nasopharyngeal Swab  Result Value Ref Range Status   SARS Coronavirus 2 NEGATIVE NEGATIVE Final    Comment: (NOTE) SARS-CoV-2 target nucleic acids are NOT DETECTED.  The SARS-CoV-2 RNA is generally detectable in upper and lower respiratory specimens during the acute phase of infection. The lowest concentration of SARS-CoV-2 viral copies this assay can detect is 250 copies / mL. A negative result does not preclude SARS-CoV-2 infection and should not be used as the sole basis for treatment or other patient management decisions.  A negative result may occur with improper specimen collection / handling, submission of specimen other than nasopharyngeal swab, presence of viral mutation(s) within the areas  targeted by this assay, and inadequate number of viral copies (<250 copies / mL). A negative result must be combined with clinical observations, patient history, and epidemiological information.  Fact Sheet for Patients:   StrictlyIdeas.no  Fact Sheet for Healthcare Providers: BankingDealers.co.za  This test is not yet approved or  cleared by the [place] FDA and has been authorized for detection and/or diagnosis of SARS-CoV-2 by FDA under an Emergency Use Authorization (EUA).  This EUA will remain in effect (meaning this test can be used) for the duration of the COVID-19 declaration under Section 564(b)(1) of the Act, 21 U.S.C. section 360bbb-3(b)(1), unless the authorization is terminated or revoked sooner.  Performed at [facility], [address] [place], [place] [address]   CULTURE, BLOOD (ROUTINE X 2) w Reflex to ID Panel     Status: None   Collection Time: 04/14/20  8:14 AM   Specimen: BLOOD  Result Value Ref Range Status   Specimen Description BLOOD LEFT [facility]  Final   Special Requests   Final    BOTTLES DRAWN AEROBIC AND ANAEROBIC Blood Culture adequate volume   Culture   Final    NO GROWTH 5 DAYS Performed at [facility], [address] [place], [place] [address]    Report Status 04/19/2020 FINAL  Final  CULTURE, BLOOD (ROUTINE X 2) w Reflex to ID Panel     Status: None   Collection Time: 04/14/20  8:14 AM   Specimen: BLOOD  Result Value Ref Range Status   Specimen Description BLOOD LEFT WRIST  Final   Special Requests   Final    BOTTLES DRAWN AEROBIC AND ANAEROBIC Blood Culture adequate volume   Culture   Final    NO GROWTH 5 DAYS Performed at [facility], [address], [place], [place] [address]    Report Status 04/19/2020 FINAL  Final  Aerobic/Anaerobic Culture (surgical/deep wound)     Status: None   Collection Time: 04/14/20 11:14 AM   Specimen: Abscess  Result Value Ref  Range Status   Specimen Description   Final    ABSCESS Performed at [facility], [address], [place], [place] [address]    Special Requests  Final    NONE Performed at [facility], [place], [place] [address]    Gram Stain   Final    NO WBC SEEN ABUNDANT GRAM NEGATIVE RODS FEW GRAM POSITIVE COCCI FEW GRAM POSITIVE RODS Performed at [facility], [place] [address] [place], [place] [address]    Culture   Final    ABUNDANT KLEBSIELLA PNEUMONIAE ABUNDANT PSEUDOMONAS AERUGINOSA MODERATE ESCHERICHIA COLI Confirmed Extended Spectrum Beta-Lactamase Producer (ESBL).  In bloodstream infections from ESBL organisms, carbapenems are preferred over piperacillin/tazobactam. They are shown to have a lower risk of mortality. MIXED ANAEROBIC FLORA PRESENT.  CALL LAB IF FURTHER IID REQUIRED.    Report Status 04/20/2020 FINAL  Final   Organism ID, Bacteria KLEBSIELLA PNEUMONIAE  Final   Organism ID, Bacteria PSEUDOMONAS AERUGINOSA  Final   Organism ID, Bacteria ESCHERICHIA COLI  Final      Susceptibility   Escherichia coli - MIC*    AMPICILLIN >=32 RESISTANT Resistant     CEFAZOLIN >=64 RESISTANT Resistant     CEFEPIME 16 RESISTANT Resistant     CEFTAZIDIME RESISTANT Resistant     CEFTRIAXONE >=64 RESISTANT Resistant     CIPROFLOXACIN >=4 RESISTANT Resistant     GENTAMICIN <=1 SENSITIVE Sensitive     IMIPENEM <=0.25 SENSITIVE Sensitive     TRIMETH/SULFA >=320 RESISTANT Resistant     AMPICILLIN/SULBACTAM >=32 RESISTANT Resistant     PIP/TAZO 8 SENSITIVE Sensitive     * MODERATE ESCHERICHIA COLI   Klebsiella pneumoniae - MIC*    AMPICILLIN >=32 RESISTANT Resistant     CEFAZOLIN <=4 SENSITIVE Sensitive     CEFEPIME <=0.12 SENSITIVE Sensitive     CEFTAZIDIME <=1 SENSITIVE Sensitive     CEFTRIAXONE <=0.25 SENSITIVE Sensitive     CIPROFLOXACIN <=0.25 SENSITIVE Sensitive     GENTAMICIN <=1 SENSITIVE Sensitive     IMIPENEM <=0.25 SENSITIVE Sensitive      TRIMETH/SULFA <=20 SENSITIVE Sensitive     AMPICILLIN/SULBACTAM 16 INTERMEDIATE Intermediate     PIP/TAZO 8 SENSITIVE Sensitive     * ABUNDANT KLEBSIELLA PNEUMONIAE   Pseudomonas aeruginosa - MIC*    CEFTAZIDIME 4 SENSITIVE Sensitive     CIPROFLOXACIN <=0.25 SENSITIVE Sensitive     GENTAMICIN 2 SENSITIVE Sensitive     IMIPENEM 2 SENSITIVE Sensitive     PIP/TAZO 8 SENSITIVE Sensitive     CEFEPIME 4 SENSITIVE Sensitive     * ABUNDANT PSEUDOMONAS AERUGINOSA  MRSA PCR Screening     Status: None   Collection Time: 04/14/20  6:19 PM   Specimen: Nasopharyngeal  Result Value Ref Range Status   MRSA by PCR NEGATIVE NEGATIVE Final    Comment:        The GeneXpert MRSA Assay (FDA approved for NASAL specimens only), is one component of a comprehensive MRSA colonization surveillance program. It is not intended to diagnose MRSA infection nor to guide or monitor treatment for MRSA infections. Performed at [facility], [address] [place], [place] [address]   Urine Culture     Status: None   Collection Time: 04/15/20  4:28 AM   Specimen: Urine, Random  Result Value Ref Range Status   Specimen Description   Final    URINE, RANDOM Performed at [facility], [address] [place], [place] [address]    Special Requests   Final    NONE Performed at [facility], [address] [place], [place] [address]    Culture   Final  NO GROWTH Performed at [facility], [place] [address], [place], [place] [address]    Report Status 04/16/2020 FINAL  Final  CULTURE, BLOOD (ROUTINE X 2) w Reflex to ID Panel     Status: None   Collection Time: 04/22/20  6:12 PM   Specimen: BLOOD  Result Value Ref Range Status   Specimen Description BLOOD FOOT  Final   Special Requests   Final    BOTTLES DRAWN AEROBIC AND ANAEROBIC Blood Culture adequate volume   Culture   Final    NO GROWTH 5 DAYS Performed at [facility], [address] [place],  [place] [address]    Report Status 04/27/2020 FINAL  Final  CULTURE, BLOOD (ROUTINE X 2) w Reflex to ID Panel     Status: None   Collection Time: 04/22/20  6:17 PM   Specimen: BLOOD  Result Value Ref Range Status   Specimen Description BLOOD FOOT  Final   Special Requests   Final    BOTTLES DRAWN AEROBIC AND ANAEROBIC Blood Culture results may not be optimal due to an excessive volume of blood received in culture bottles   Culture   Final    NO GROWTH 5 DAYS Performed at [facility], [address] [place], [place] [address]    Report Status 04/27/2020 FINAL  Final  CULTURE, BLOOD (ROUTINE X 2) w Reflex to ID Panel     Status: None   Collection Time: 05/01/20  2:50 PM   Specimen: BLOOD  Result Value Ref Range Status   Specimen Description BLOOD BLOOD LEFT HAND  Final   Special Requests   Final    BOTTLES DRAWN AEROBIC AND ANAEROBIC Blood Culture adequate volume   Culture   Final    NO GROWTH 5 DAYS Performed at [facility], [address] [place], [place] [address]    Report Status 05/06/2020 FINAL  Final  CULTURE, BLOOD (ROUTINE X 2) w Reflex to ID Panel     Status: None   Collection Time: 05/01/20  2:51 PM   Specimen: BLOOD  Result Value Ref Range Status   Specimen Description BLOOD BLOOD RIGHT HAND  Final   Special Requests   Final    BOTTLES DRAWN AEROBIC AND ANAEROBIC Blood Culture adequate volume   Culture   Final    NO GROWTH 5 DAYS Performed at [facility], [address] [place], [place] [address]    Report Status 05/06/2020 FINAL  Final    Coagulation Studies: No results for input(s): LABPROT, INR in the last 72 hours.  Urinalysis: No results for input(s): COLORURINE, LABSPEC, PHURINE, GLUCOSEU, HGBUR, BILIRUBINUR, KETONESUR, PROTEINUR, UROBILINOGEN, NITRITE, LEUKOCYTESUR in the last 72 hours.  Invalid input(s): APPERANCEUR    Imaging: DG Chest Port 1 View  Result Date: 05/05/2020 CLINICAL DATA:  Fever. EXAM: PORTABLE CHEST 1  VIEW COMPARISON:  April 28, 2020. FINDINGS: The heart size and mediastinal contours are within normal limits. No pneumothorax is noted. Right internal jugular dialysis catheter is unchanged in position. Hypoinflation of the lungs is noted with mild bibasilar subsegmental atelectasis. Small pleural effusions may be present. Left perihilar opacity is noted concerning for atelectasis or possibly infiltrate. The visualized skeletal structures are unremarkable. IMPRESSION: Hypoinflation of the lungs is noted with mild bibasilar subsegmental atelectasis. Left perihilar opacity is noted concerning for atelectasis or possibly infiltrate. Electronically Signed   By: Marijo Conception M.D.   On: 05/05/2020 08:28     Medications:   .  sodium chloride 250 mL (05/03/20 0043)  . anidulafungin 100 mg (05/05/20 2319)   . (feeding supplement) PROSource Plus  30 mL Oral TID BM  . acidophilus  1 capsule Oral Daily  . alteplase  2 mg Intracatheter Once  . alteplase  2 mg Intracatheter Once  . amiodarone  200 mg Oral BID  . Chlorhexidine Gluconate Cloth  6 each Topical Daily  . docusate sodium  200 mg Oral BID  . epoetin (EPOGEN/PROCRIT) injection  10,000 Units Intravenous Q M,W,F-HD  . famotidine  10 mg Oral Q1200  . feeding supplement (NEPRO CARB STEADY)  237 mL Oral TID BM  . guaiFENesin-dextromethorphan  10 mL Oral Q8H  . heparin injection (subcutaneous)  5,000 Units Subcutaneous Q8H  . magnesium oxide  400 mg Oral Daily  . mometasone-formoterol  2 puff Inhalation BID  . multivitamin  1 tablet Oral QHS  . pregabalin  25 mg Oral Daily  . sodium chloride flush  5 mL Intracatheter Q8H  . torsemide  40 mg Oral Daily   acetaminophen **OR** acetaminophen, albuterol, ALPRAZolam, bisacodyl, diphenhydrAMINE, heparin NICU/SCN flush, HYDROmorphone (DILAUDID) injection, iohexol, ipratropium-albuterol, labetalol, magic mouthwash **AND** lidocaine, magnesium hydroxide, menthol-cetylpyridinium, oxyCODONE-acetaminophen,  phenol, simethicone  Assessment/ Plan:  [name] is a 59 y.o. black female with HIV, COPD, hypertension, GERD, who is admitted to [facility] on 04/14/2020 for Dyspnea [R06.00] Colonic diverticular abscess [K57.20] Diverticulitis of large intestine with abscess without bleeding [K57.20] Abscess of sigmoid colon due to diverticulitis [K57.20]  #Acute renal failure   No history of chronic kidney disease.   Requiring hemodialysis. First treatment on 8/18.  IV contrast exposure on 8/16. Ultrasound -negative for obstruction or mass.   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    Will continue monitoring daily for dialysis requirement Fluid and electrolyte status acceptable, no need for dialysis today If serum creatinine and BUN continue to increase, will probably dialyze on Wednesday.   #. Anemia of kidney failure:  Lab Results  Component Value Date   HGB 7.8 (L) 05/06/2020   -Continue Epogen with dialysis treatments - Low threshold for PRBC transfusion.   #Lower extremity edema Continue oral torsemide  #Fever Patient continues to have intermittent low grade fever Will continue monitoring for sepsis    LOS: 22 Louay Myrie 9/7/20211:38 PM

## 2020-05-06 NOTE — Progress Notes (Signed)
OT Cancellation Note  Patient Details Name: Melanie Bowen MRN: [id number] DOB: 06/09/1961   Cancelled Treatment:    Reason Eval/Treat Not Completed: Fatigue/lethargy limiting ability to participate  Extensive attempts made to awaken patient for therapy participation to no avail. OT offers several different treatment options, but pt only opens eyes/attends in ~5 second spurts and quickly resumes sleeping/snoring. Will f/u for OT treatment at later date/time as appropriate. Thank you.  Gerrianne Scale, [place], OTR/L ascom [phone] 05/06/20, 2:10 PM

## 2020-05-06 NOTE — Progress Notes (Signed)
Palliative: Ms. Tangney is resting quietly in bed with her younger Sister Tammy at bedside.  Ms. Spielberg continues to look acutely/chronically ill, morbidly obese, frail.  She will briefly make but not keep eye contact.  She is oriented to person and place, situation.  Thorough chart review completed.  Ms. Margaruite, Top, and talked about her need for continued physical therapy, the need to participate more with therapy.  Younger sister Lynelle Smoke states that she has been trying to encourage Doha to participate.  Tammy shares her health struggles over the years, she had a need for temporary dialysis, has multiple myeloma with chemo monthly.  Tammy tells me that Brody was there for her encouraging her through her struggles to regain health.  Tammy states that she is trying to impress upon her sister the importance of mobility.    We talked about a few "what if's and maybe's".  At this point it is likely that Ms. Scalia will need ongoing dialysis.  I shared that it may be possible that she would need to be a resident of a nursing home.  We talked about physical therapy, and I shared that the body is not meant to lie in the bed.  Tammy seems very knowledgeable about her sister's acute and chronic health concerns, she seems to have firsthand knowledge.  Tammy states that she is in contact with Ms. Mills adult daughters, helping them understand her illness burden, future choices.  Conference with attending and bedside nursing staff related to patient condition, needs, goals of care.  Plan: Continue to treat the treatable but no BiPAP or intubation.  At this point the goal continues to be rehab then return home.  44 minutes Quinn Axe, NP Palliative medicine team Team phone [phone] Greater than 50% of this time was spent counseling and coordinating care related to the above assessment and plan.

## 2020-05-06 NOTE — Progress Notes (Signed)
Progress Note    Melanie Bowen  HWE:[id number] DOB: 05-12-1961  DOA: 04/14/2020 PCP: Theotis Burrow, MD      Brief Narrative:    Medical records reviewed and are as summarized below:  Melanie Bowen is a 59 y.o. female significant for hypertension, diverticulosis, enteric infection, morbid obesity, GERD, COPD, history of stroke, asthma, depression, seizures enzymes with acute onset of abdominal pain nausea vomiting and constipation.   She was admitted to the hospital for septic shock secondary to acute diverticulitis with diverticular abscess.  She was treated with IV fluids and empiric IV antimicrobial therapy.  She required vasopressors for septic shock.  She was seen by interventional radiologist and CT-guided drainage was done by IR on 04/14/2020.  Fluid culture showed Pseudomonas aeruginosa, Klebsiella pneumonia and E. coli.  Septic shock was complicated by acute kidney injury, acute liver failure, acute metabolic encephalopathy and acute hypoxemic respiratory failure.  She was seen by the nephrologist for AKI.  Patient was started on hemodialysis for AKI. She developed atrial fibrillation with RVR.  She was seen in consultation by the cardiologist as well.    Assessment/Plan:   Principal Problem:   Severe sepsis with septic shock (HCC) Active Problems:   HTN (hypertension)   HIV (human immunodeficiency virus infection) ([place])   Diverticulitis of large intestine with abscess without bleeding   Abscess of sigmoid colon due to diverticulitis   Acute renal failure (ARF) (HCC)   Hypotension   Diverticulosis   Acute respiratory distress   DNR (do not resuscitate) discussion   Palliative care by specialist   Dyspnea    Nutrition Problem: Increased nutrient needs Etiology: chronic illness (COPD, new HD)  Signs/Symptoms: estimated needs   Body mass index is 46.57 kg/m.  (Morbid obesity): This complicates overall care and prognosis   Septic shock -  resolved  Acute sigmoid diverticulitis and diverticular abscess, significant leukocytosis- s/p CT-guided drainage by IR on 04/14/2020.  Fluid culture showed Klebsiella pneumonia, pseudomonas aeruginosa and E. coli.  Acute kidney injury complicated by hyperkalemia and metabolic acidosis  Atrial fibrillation with RVR   Acute anemia, likely multifactorial poor oral intake and acute illness (no evidence of bleeding thus far)  Sinus tachycardia  COPD exacerbation-resolved  HIV infection  Acute liver failure, leukocytosis-resolved  Acute hypoxemic respiratory failure   Acute metabolic encephalopathy/delirium -recurrent.  No acute abnormality on CT head  History of stroke  PLAN   s/p transfusion with 1 unit of PRBCs on 05/05/2020 and 1 unit on 05/01/2020.  Monitor H&H and transfuse as needed. She completed IV meropenem on 05/05/2020. Continue anidulafungin.  Follow-up with ID for further recommendations. Continue bronchodilators Continue antiretroviral therapy Continue amiodarone Consideration for long-term anticoagulation in the future Turn patient every 2 hours.  Plan was discussed with the patient's sister, Melanie Bowen, at the bedside.  Diagnosis and prognosis were discussed.  Prognosis is guarded.    Diet Order            Diet renal with fluid restriction Room service appropriate? Yes; Fluid consistency: Thin  Diet effective now                    Consultants:  General surgeon  Cardiologist  Nephrologist  Vascular surgeon  Procedures:  CT-guided drainage of diverticular abscess on 04/14/2020    Medications:   . (feeding supplement) PROSource Plus  30 mL Oral TID BM  . acidophilus  1 capsule Oral Daily  . alteplase  2  mg Intracatheter Once  . alteplase  2 mg Intracatheter Once  . amiodarone  200 mg Oral BID  . Chlorhexidine Gluconate Cloth  6 each Topical Daily  . docusate sodium  200 mg Oral BID  . epoetin (EPOGEN/PROCRIT) injection  10,000 Units Intravenous  Q M,W,F-HD  . famotidine  10 mg Oral Q1200  . feeding supplement (NEPRO CARB STEADY)  237 mL Oral TID BM  . guaiFENesin-dextromethorphan  10 mL Oral Q8H  . heparin injection (subcutaneous)  5,000 Units Subcutaneous Q8H  . magnesium oxide  400 mg Oral Daily  . mometasone-formoterol  2 puff Inhalation BID  . multivitamin  1 tablet Oral QHS  . pregabalin  25 mg Oral Daily  . sodium chloride flush  5 mL Intracatheter Q8H  . torsemide  40 mg Oral Daily   Continuous Infusions: . sodium chloride 250 mL (05/03/20 0043)  . anidulafungin 100 mg (05/05/20 2319)     Anti-infectives (From admission, onward)   Start     Dose/Rate Route Frequency Ordered Stop   05/06/20 0956  meropenem (MERREM) 500 mg in sodium chloride 0.9 % 100 mL IVPB  Status:  Discontinued        500 mg 200 mL/hr over 30 Minutes Intravenous Every 24 hours 05/05/20 1018 05/05/20 1637   05/03/20 2000  anidulafungin (ERAXIS) 100 mg in sodium chloride 0.9 % 100 mL IVPB        100 mg 78 mL/hr over 100 Minutes Intravenous Every 24 hours 05/02/20 2223     05/02/20 2330  anidulafungin (ERAXIS) 200 mg in sodium chloride 0.9 % 200 mL IVPB        200 mg 78 mL/hr over 200 Minutes Intravenous  Once 05/02/20 2223 05/03/20 0455   04/30/20 0800  rilpivirine (EDURANT) tablet 25 mg  Status:  Discontinued       "And" Linked Group Details   25 mg Oral Daily with breakfast 04/29/20 1254 05/02/20 2223   04/30/20 0800  dolutegravir (TIVICAY) tablet 50 mg  Status:  Discontinued       "And" Linked Group Details   50 mg Oral Daily with breakfast 04/29/20 1254 05/02/20 2223   04/28/20 2200  meropenem (MERREM) 500 mg in sodium chloride 0.9 % 100 mL IVPB  Status:  Discontinued        500 mg 200 mL/hr over 30 Minutes Intravenous Every 12 hours 04/28/20 1546 05/05/20 1018   04/26/20 2200  meropenem (MERREM) 500 mg in sodium chloride 0.9 % 100 mL IVPB  Status:  Discontinued        500 mg 200 mL/hr over 30 Minutes Intravenous Every 8 hours 04/26/20 1258  04/28/20 1546   04/25/20 1700  rilpivirine (EDURANT) tablet 25 mg  Status:  Discontinued        25 mg Oral Daily with breakfast 04/24/20 1636 04/25/20 1111   04/25/20 1200  rilpivirine (EDURANT) tablet 25 mg  Status:  Discontinued        25 mg Oral Daily with breakfast 04/25/20 1111 04/29/20 1254   04/25/20 1000  meropenem (MERREM) 500 mg in sodium chloride 0.9 % 100 mL IVPB  Status:  Discontinued        500 mg 200 mL/hr over 30 Minutes Intravenous Every 12 hours 04/25/20 0935 04/26/20 1258   04/24/20 1700  darunavir-cobicistat (PREZCOBIX) 800-150 MG per tablet 1 tablet  Status:  Discontinued        1 tablet Oral Daily with breakfast 04/24/20 1231 04/24/20 1636   04/24/20 1330  dolutegravir (TIVICAY) tablet 50 mg  Status:  Discontinued        50 mg Oral Daily 04/24/20 1231 04/29/20 1254   04/23/20 2200  meropenem (MERREM) 500 mg in sodium chloride 0.9 % 100 mL IVPB  Status:  Discontinued        500 mg 200 mL/hr over 30 Minutes Intravenous Every 24 hours 04/23/20 1127 04/25/20 0935   04/18/20 2000  levofloxacin (LEVAQUIN) IVPB 750 mg        750 mg 100 mL/hr over 90 Minutes Intravenous  Once 04/18/20 1857 04/19/20 0004   04/18/20 1800  levofloxacin (LEVAQUIN) IVPB 750 mg  Status:  Discontinued        750 mg 100 mL/hr over 90 Minutes Intravenous  Once 04/18/20 1640 04/18/20 1857   04/16/20 2200  anidulafungin (ERAXIS) 100 mg in sodium chloride 0.9 % 100 mL IVPB        100 mg 78 mL/hr over 100 Minutes Intravenous Every 24 hours 04/16/20 1707 04/22/20 0211   04/15/20 2200  valACYclovir (VALTREX) tablet 500 mg  Status:  Discontinued        500 mg Oral Daily at bedtime 04/15/20 1248 04/16/20 1259   04/15/20 2200  anidulafungin (ERAXIS) 200 mg in sodium chloride 0.9 % 200 mL IVPB        200 mg 78 mL/hr over 200 Minutes Intravenous  Once 04/15/20 2008 04/16/20 0327   04/15/20 2200  meropenem (MERREM) 1 g in sodium chloride 0.9 % 100 mL IVPB  Status:  Discontinued        1 g 200 mL/hr over 30  Minutes Intravenous Every 12 hours 04/15/20 2017 04/23/20 1127   04/15/20 1400  piperacillin-tazobactam (ZOSYN) IVPB 3.375 g  Status:  Discontinued        3.375 g 12.5 mL/hr over 240 Minutes Intravenous Every 8 hours 04/15/20 1300 04/15/20 2006   04/14/20 2200  elvitegravir-cobicistat-emtricitabine-tenofovir (GENVOYA) 150-150-200-10 MG tablet 1 tablet  Status:  Discontinued        1 tablet Oral Daily at bedtime 04/14/20 0235 04/15/20 0957   04/14/20 2200  valACYclovir (VALTREX) tablet 1,000 mg  Status:  Discontinued        1,000 mg Oral Daily at bedtime 04/14/20 0235 04/15/20 1248   04/14/20 1230  cefTRIAXone (ROCEPHIN) 2 g in sodium chloride 0.9 % 100 mL IVPB  Status:  Discontinued        2 g 200 mL/hr over 30 Minutes Intravenous Every 24 hours 04/14/20 1223 04/15/20 1300   04/14/20 1230  metroNIDAZOLE (FLAGYL) IVPB 500 mg  Status:  Discontinued        500 mg 100 mL/hr over 60 Minutes Intravenous Every 8 hours 04/14/20 1223 04/15/20 1636   04/14/20 1030  piperacillin-tazobactam (ZOSYN) IVPB 3.375 g  Status:  Discontinued        3.375 g 12.5 mL/hr over 240 Minutes Intravenous Every 8 hours 04/14/20 0235 04/14/20 1223   04/14/20 0215  piperacillin-tazobactam (ZOSYN) IVPB 4.5 g  Status:  Discontinued        4.5 g 200 mL/hr over 30 Minutes Intravenous  Once 04/14/20 0200 04/14/20 0208   04/14/20 0215  piperacillin-tazobactam (ZOSYN) IVPB 3.375 g        3.375 g 100 mL/hr over 30 Minutes Intravenous  Once 04/14/20 0208 04/14/20 0301             Family Communication/Anticipated D/C date and plan/Code Status   DVT prophylaxis: heparin injection 5,000 Units Start: 04/23/20 0800 because of high risk  for DVT     Code Status: Partial Code  Family Communication: Plan discussed with Tammy, at the bedside Disposition Plan:    Status is: Inpatient  Remains inpatient appropriate because:Inpatient level of care appropriate due to severity of illness and Requiring hemodialysis for acute  kidney injury   Dispo: The patient is from: Home              Anticipated d/c is to: SNF              Anticipated d/c date is: > 3 days              Patient currently is not medically stable to d/c.           Subjective:   Interval events noted.  Patient has been intermittently confused.  Objective:    Vitals:   05/05/20 1811 05/05/20 2027 05/06/20 0620 05/06/20 0834  BP: (!) 94/52 (!) 106/54 117/66 122/80  Pulse: (!) 108 97 88 (!) 103  Resp: (!) 24 19 18 19   Temp: 99 F (37.2 C) 99.7 F (37.6 C) 98.7 F (37.1 C) 98.9 F (37.2 C)  TempSrc: Oral Oral Oral Oral  SpO2: 99% 99% 97% 96%  Weight:      Height:       No data found.   Intake/Output Summary (Last 24 hours) at 05/06/2020 0956 Last data filed at 05/06/2020 0600 Gross per 24 hour  Intake 1116.61 ml  Output 260 ml  Net 856.61 ml   Filed Weights   04/29/20 0500 05/02/20 0358 05/03/20 0547  Weight: 134.4 kg 135.2 kg 130.9 kg    Exam:  GEN: No acute respiratory distress SKIN: Raw areas on bilateral buttocks and bilateral thighs EYES: Slightly pale but anicteric ENT: MMM  CV: Regular rate and rhythm PULM: No rales, no wheezing heard ABD: Soft, distended, mild tenderness left lower quadrant, no rebound tenderness or guarding, +LLQ JP drain with purulent fluid CNS: Drowsy but arousable.  Oriented to person. EXT: Bilateral leg edema, no tenderness or erythema.     Data Reviewed:   I have personally reviewed following labs and imaging studies:  Labs: Labs show the following:   Basic Metabolic Panel: Recent Labs  Lab 05/01/20 0740 05/01/20 0740 05/02/20 0945 05/02/20 0945 05/03/20 0636 05/04/20 0543 05/04/20 0543 05/05/20 0831 05/06/20 0701  NA 134*  --  134*  --   --  136  --  139 139  K 4.1   < > 4.2   < >  --  3.7   < > 3.9 4.1  CL 96*  --  97*  --   --  98  --  101 99  CO2 25  --  24  --   --  25  --  26 27  GLUCOSE 73  --  76  --   --  93  --  103* 101*  BUN 55*  --  67*  --   --   52*  --  66* 72*  CREATININE 3.93*  --  4.71*  --   --  4.15*  --  4.66* 4.96*  CALCIUM 7.6*  --  7.8*  --   --  7.6*  --  7.5* 7.7*  MG 1.7   < > 1.8  --  1.6* 1.8  --  1.9 2.1  PHOS 6.2*  --  7.0*  --   --  5.8*  --  6.4* 7.5*   < > = values in  this interval not displayed.   GFR Estimated Creatinine Clearance: 17.2 mL/min (A) (by C-G formula based on SCr of 4.96 mg/dL (H)). Liver Function Tests: Recent Labs  Lab 05/01/20 0705 05/01/20 0705 05/02/20 0945 05/03/20 0636 05/04/20 0543 05/05/20 0831 05/06/20 0701  AST 28  --   --  27  --   --   --   ALT 17  --   --  18  --   --   --   ALKPHOS 97  --   --  101  --   --   --   BILITOT 1.4*  --   --  1.4*  --   --   --   PROT 5.3*  --   --  5.8*  --   --   --   ALBUMIN 1.4*   < > 1.4* 1.5* 1.3* 1.3* 1.3*   < > = values in this interval not displayed.   No results for input(s): LIPASE, AMYLASE in the last 168 hours. No results for input(s): AMMONIA in the last 168 hours. Coagulation profile No results for input(s): INR, PROTIME in the last 168 hours.  CBC: Recent Labs  Lab 05/01/20 0830 05/02/20 0945 05/04/20 0543 05/05/20 0831 05/06/20 0701  WBC 27.4* 23.1* 11.0* 7.2 5.4  NEUTROABS 25.1* 21.6* 9.4* 5.9 4.1  HGB 6.9* 7.2* 6.7* 6.8* 7.8*  HCT 19.3* 21.4* 19.5* 20.6* 23.3*  MCV 88.1 90.7 89.9 91.6 89.6  PLT 151 152 162 216 274   Cardiac Enzymes: No results for input(s): CKTOTAL, CKMB, CKMBINDEX, TROPONINI in the last 168 hours. BNP (last 3 results) No results for input(s): PROBNP in the last 8760 hours. CBG: Recent Labs  Lab 05/03/20 0844  GLUCAP 78   D-Dimer: No results for input(s): DDIMER in the last 72 hours. Hgb A1c: No results for input(s): HGBA1C in the last 72 hours. Lipid Profile: No results for input(s): CHOL, HDL, LDLCALC, TRIG, CHOLHDL, LDLDIRECT in the last 72 hours. Thyroid function studies: No results for input(s): TSH, T4TOTAL, T3FREE, THYROIDAB in the last 72 hours.  Invalid input(s):  FREET3 Anemia work up: No results for input(s): VITAMINB12, FOLATE, FERRITIN, TIBC, IRON, RETICCTPCT in the last 72 hours. Sepsis Labs: Recent Labs  Lab 05/02/20 0945 05/04/20 0543 05/05/20 0831 05/06/20 0701  WBC 23.1* 11.0* 7.2 5.4    Microbiology Recent Results (from the past 240 hour(s))  CULTURE, BLOOD (ROUTINE X 2) w Reflex to ID Panel     Status: None (Preliminary result)   Collection Time: 05/01/20  2:50 PM   Specimen: BLOOD  Result Value Ref Range Status   Specimen Description BLOOD BLOOD LEFT HAND  Final   Special Requests   Final    BOTTLES DRAWN AEROBIC AND ANAEROBIC Blood Culture adequate volume   Culture   Final    NO GROWTH 4 DAYS Performed at [facility], [address], [place], [place] [address]    Report Status PENDING  Incomplete  CULTURE, BLOOD (ROUTINE X 2) w Reflex to ID Panel     Status: None (Preliminary result)   Collection Time: 05/01/20  2:51 PM   Specimen: BLOOD  Result Value Ref Range Status   Specimen Description BLOOD BLOOD RIGHT HAND  Final   Special Requests   Final    BOTTLES DRAWN AEROBIC AND ANAEROBIC Blood Culture adequate volume   Culture   Final    NO GROWTH 4 DAYS Performed at [facility], [address] [place], [place] [address]  Report Status PENDING  Incomplete    Procedures and diagnostic studies:  DG Chest Port 1 View  Result Date: 05/05/2020 CLINICAL DATA:  Fever. EXAM: PORTABLE CHEST 1 VIEW COMPARISON:  April 28, 2020. FINDINGS: The heart size and mediastinal contours are within normal limits. No pneumothorax is noted. Right internal jugular dialysis catheter is unchanged in position. Hypoinflation of the lungs is noted with mild bibasilar subsegmental atelectasis. Small pleural effusions may be present. Left perihilar opacity is noted concerning for atelectasis or possibly infiltrate. The visualized skeletal structures are unremarkable. IMPRESSION: Hypoinflation of the lungs is noted with mild  bibasilar subsegmental atelectasis. Left perihilar opacity is noted concerning for atelectasis or possibly infiltrate. Electronically Signed   By: Marijo Conception M.D.   On: 05/05/2020 08:28               LOS: 22 days   [facility]   Pager on [email or web address]. If 7PM-7AM, please contact night-coverage at [email or web address]     05/06/2020, 9:56 AM

## 2020-05-06 NOTE — Progress Notes (Addendum)
Update given to daughter Willy Vorce over the phone. New room number given.    Patient transferred to room 219 by this RN and nurse tech. Patient hooked up on new tele box and hooked up on 2L of oxygen, call bell within reach. All meds and assesment done by [facility].

## 2020-05-06 NOTE — Progress Notes (Signed)
Physical Therapy Treatment Patient Details Name: Melanie Bowen MRN: [id number] DOB: 12-Feb-1961 Today's Date: 05/06/2020    History of Present Illness Melanie Bowen is a 70yoF who comes to [facility] on 8/16 c LLQ ABD pain. Pt admitted c Acute sigmoid diverticulitis with diverticular abscess. PMH: COPD, asthma, HTN, diverticulosis, HIV, GERD, depression, bilat TKA, 3 level lumbar fusion, susequent lumbar hardware removal. Pt moved to ICU early in stay, inititated on HD on 04/16/20, temp cath removed 8/25. Pt  has had AF RVR issues. Pt also reports a subacute Rt rotator cuff injury with significant pain and disability, scheduled for repair with [name] in mid September.    PT Comments    Pt was in semi fowlers position upon arriving with supportive sister at bedside. She required constant max encouragement to participate and give full effort. Pt very weak and fatigued throughout. Slightly lethargic but overall conversational during session. Max assist + vcs to roll L to short sit. Pt able to sit EOB x ~ 20 sec prior to pt demanding to return to supine. Unwilling to trial second trial. Lengthy discussion with pt and pt's sister about need to increase daily activity.She states," I'll do more tomorrow." Discussed that she needs to be as active as possible throughout the day especially on non HD days. She continued to be unwilling to participate with further activity, Pt was repositioned in bed, quarter turned to L to promote wound healing. PT will continue to progress pt as able per tolerance. Highly recommend DC to SNF when medically stable. At conclusion of session, pt is in bed, bed alarm set, pillow support to promote wound healing and call bell in reach.    Follow Up Recommendations  SNF     Equipment Recommendations  Other (comment) (defer to next level of care)    Recommendations for Other Services       Precautions / Restrictions Precautions Precautions: Fall Precaution Comments: femoral  cath removed Restrictions Weight Bearing Restrictions: No    Mobility  Bed Mobility Overal bed mobility: Needs Assistance Bed Mobility: Supine to Sit;Sit to Supine Rolling: Max assist   Supine to sit: Max assist Sit to supine: Max assist   General bed mobility comments: pt was able to roll L to short sit with max vcs and max assistance. pt moans throughout all mobility and activity.  Transfers                 General transfer comment: unsafe unwilling      Balance Overall balance assessment: Needs assistance Sitting-balance support: Feet supported;Bilateral upper extremity supported Sitting balance-Leahy Scale: Poor Sitting balance - Comments: constant mod-max assist to maintain sitting balance EOB. pt only sat EOB x ~ 20 sec prior to returning to supine. unwilling to try secound trial               Cognition Arousal/Alertness: Awake/alert;Lethargic Behavior During Therapy: Anxious Overall Cognitive Status: Within Functional Limits for tasks assessed            General Comments: Pt is A but lethargic. continues to require max encouragement for all activity. supportive sister present and helps provide encouragement. Pt moans throughout session              Pertinent Vitals/Pain Pain Assessment: 0-10 Pain Score: 4  Faces Pain Scale: Hurts little more Pain Location: BLEs/sacral region (wound)  Pain Descriptors / Indicators: Burning;Sharp Pain Intervention(s): Limited activity within patient's tolerance;Monitored during session;Repositioned  PT Goals (current goals can now be found in the care plan section) Acute Rehab PT Goals Patient Stated Goal: go home Progress towards PT goals: Not progressing toward goals - comment (pt's willingness to fully participate)    Frequency    Min 2X/week      PT Plan Current plan remains appropriate    Co-evaluation              AM-PAC PT "6 Clicks" Mobility   Outcome Measure  Help needed  turning from your back to your side while in a flat bed without using bedrails?: A Lot Help needed moving from lying on your back to sitting on the side of a flat bed without using bedrails?: A Lot Help needed moving to and from a bed to a chair (including a wheelchair)?: Total Help needed standing up from a chair using your arms (e.g., wheelchair or bedside chair)?: Total Help needed to walk in hospital room?: Total Help needed climbing 3-5 steps with a railing? : Total 6 Click Score: 8    End of Session Equipment Utilized During Treatment: Oxygen Activity Tolerance: Patient limited by fatigue;Patient limited by pain Patient left: in bed;with call bell/phone within reach;with bed alarm set Nurse Communication: Mobility status PT Visit Diagnosis: Unsteadiness on feet (R26.81);Other abnormalities of gait and mobility (R26.89);Muscle weakness (generalized) (M62.81);Difficulty in walking, not elsewhere classified (R26.2)     Time: 9678-9381 PT Time Calculation (min) (ACUTE ONLY): 28 min  Charges:  $Therapeutic Activity: 23-37 mins                     Julaine Fusi PTA 05/06/20, 9:45 AM

## 2020-05-07 DIAGNOSIS — A419 Sepsis, unspecified organism: Secondary | ICD-10-CM | POA: Diagnosis not present

## 2020-05-07 DIAGNOSIS — G9341 Metabolic encephalopathy: Secondary | ICD-10-CM

## 2020-05-07 DIAGNOSIS — R6521 Severe sepsis with septic shock: Secondary | ICD-10-CM | POA: Diagnosis not present

## 2020-05-07 DIAGNOSIS — J9601 Acute respiratory failure with hypoxia: Secondary | ICD-10-CM

## 2020-05-07 DIAGNOSIS — K5720 Diverticulitis of large intestine with perforation and abscess without bleeding: Secondary | ICD-10-CM | POA: Diagnosis not present

## 2020-05-07 LAB — CBC
HCT: 21.8 % — ABNORMAL LOW (ref 36.0–46.0)
Hemoglobin: 7.2 g/dL — ABNORMAL LOW (ref 12.0–15.0)
MCH: 30.5 pg (ref 26.0–34.0)
MCHC: 33 g/dL (ref 30.0–36.0)
MCV: 92.4 fL (ref 80.0–100.0)
Platelets: 302 10*3/uL (ref 150–400)
RBC: 2.36 MIL/uL — ABNORMAL LOW (ref 3.87–5.11)
RDW: 16.9 % — ABNORMAL HIGH (ref 11.5–15.5)
WBC: 3.4 10*3/uL — ABNORMAL LOW (ref 4.0–10.5)
nRBC: 1.5 % — ABNORMAL HIGH (ref 0.0–0.2)

## 2020-05-07 LAB — HELPER T-LYMPH-CD4 (ARMC ONLY)
% CD 4 Pos. Lymph.: 50.1 % (ref 30.8–58.5)
Absolute CD 4 Helper: 301 /uL — ABNORMAL LOW (ref 359–1519)
Basophils Absolute: 0.1 10*3/uL (ref 0.0–0.2)
Basos: 0 %
EOS (ABSOLUTE): 0.2 10*3/uL (ref 0.0–0.4)
Eos: 1 %
Hematocrit: 23.6 % — ABNORMAL LOW (ref 34.0–46.6)
Hemoglobin: 7.8 g/dL — ABNORMAL LOW (ref 11.1–15.9)
Immature Grans (Abs): 0.2 10*3/uL — ABNORMAL HIGH (ref 0.0–0.1)
Immature Granulocytes: 1 %
Lymphocytes Absolute: 0.6 10*3/uL — ABNORMAL LOW (ref 0.7–3.1)
Lymphs: 3 %
MCH: 30.6 pg (ref 26.6–33.0)
MCHC: 33.1 g/dL (ref 31.5–35.7)
MCV: 93 fL (ref 79–97)
Monocytes Absolute: 0.5 10*3/uL (ref 0.1–0.9)
Monocytes: 2 %
Neutrophils Absolute: 16.9 10*3/uL — ABNORMAL HIGH (ref 1.4–7.0)
Neutrophils: 93 %
Platelets: 131 10*3/uL — ABNORMAL LOW (ref 150–450)
RBC: 2.55 x10E6/uL — ABNORMAL LOW (ref 3.77–5.28)
RDW: 14.8 % (ref 11.7–15.4)
WBC: 18.4 10*3/uL — ABNORMAL HIGH (ref 3.4–10.8)

## 2020-05-07 LAB — RENAL FUNCTION PANEL
Albumin: 1.2 g/dL — ABNORMAL LOW (ref 3.5–5.0)
Anion gap: 13 (ref 5–15)
BUN: 60 mg/dL — ABNORMAL HIGH (ref 6–20)
CO2: 28 mmol/L (ref 22–32)
Calcium: 7.5 mg/dL — ABNORMAL LOW (ref 8.9–10.3)
Chloride: 99 mmol/L (ref 98–111)
Creatinine, Ser: 3.81 mg/dL — ABNORMAL HIGH (ref 0.44–1.00)
GFR calc Af Amer: 14 mL/min — ABNORMAL LOW (ref >60)
GFR calc non Af Amer: 12 mL/min — ABNORMAL LOW (ref >60)
Glucose, Bld: 86 mg/dL (ref 70–99)
Phosphorus: 5.1 mg/dL — ABNORMAL HIGH (ref 2.5–4.6)
Potassium: 3.6 mmol/L (ref 3.5–5.1)
Sodium: 140 mmol/L (ref 135–145)

## 2020-05-07 LAB — MAGNESIUM: Magnesium: 2.2 mg/dL (ref 1.7–2.4)

## 2020-05-07 MED ORDER — RILPIVIRINE HCL 25 MG PO TABS
25.0000 mg | ORAL_TABLET | Freq: Every day | ORAL | Status: DC
  Administered 2020-05-07 – 2020-05-22 (×12): 25 mg via ORAL
  Filled 2020-05-07 (×19): qty 1

## 2020-05-07 MED ORDER — DOLUTEGRAVIR SODIUM 50 MG PO TABS
50.0000 mg | ORAL_TABLET | Freq: Every day | ORAL | Status: DC
  Administered 2020-05-07 – 2020-05-22 (×12): 50 mg via ORAL
  Filled 2020-05-07 (×18): qty 1

## 2020-05-07 NOTE — Progress Notes (Addendum)
PROGRESS NOTE    Melanie Bowen  DTO:[id number] DOB: 03/13/1961 DOA: 04/14/2020 PCP: Theotis Burrow, MD  Assessment & Plan:   Principal Problem:   Severe sepsis with septic shock ([place]) Active Problems:   HTN (hypertension)   HIV (human immunodeficiency virus infection) ([place])   Diverticulitis of large intestine with abscess without bleeding   Abscess of sigmoid colon due to diverticulitis   Acute renal failure (ARF) ([place])   Hypotension   Diverticulosis   Acute respiratory distress   DNR (do not resuscitate) discussion   Palliative care by specialist   Dyspnea   Goals of care, counseling/discussion   Septic shock:  resolved  Acute sigmoid diverticulitis and diverticular abscess: s/p CT-guided drainage by IR on 04/14/2020.  Fluid culture showed Klebsiella pneumonia, pseudomonas aeruginosa and E. Coli. Completed IV merrem course. Continue on IV anidulafungin as per ID  AKI: continue on HD as per nephro. Has an outpatient HD spot set up  A. Fib: continue on amiodarone. Will consider long-term anticoagulation in the future  Acute normocytic anemia: s/p 2 units of pRBCs. Will transfuse if Hb <7. Will continue to monitor   COPD exacerbation: resolved. Continue on bronchodilators & incentive spirometry   HIV infection: continue on rilpivirine, dolutegravir as per ID   Acute liver failure: resolved  Leukocytosis: resolved  Acute hypoxemic respiratory failure: continue on supplemental oxygen and wean as tolerated, currently on 2L [place]   Acute metabolic encephalopathy/delirium: recurrent.  No acute abnormality on CT head. Re-orient prn   History of stroke: not on statin, aspirin or plavix at home.   DVT prophylaxis: heparin  Code Status: full  Family Communication: Disposition Plan:  Likely d/c to SNF  Status is: Inpatient  Remains inpatient appropriate because:IV treatments appropriate due to intensity of illness or inability to take PO   Dispo: The  patient is from: Home              Anticipated d/c is to: SNF              Anticipated d/c date is: > 3 days              Patient currently is not medically stable to d/c.     Consultants:   nephro   gen surg   Vascular surg    Procedures:   Antimicrobials: completed course   Subjective: Pt c/o malaise   Objective: Vitals:   05/06/20 2017 05/07/20 0025 05/07/20 0324 05/07/20 1155  BP: 124/67 119/70 126/69 (!) 102/54  Pulse: 98 (!) 103 (!) 103 (!) 110  Resp: (!) 24 20 20 20   Temp: 97.9 F (36.6 C) 98.4 F (36.9 C) 97.9 F (36.6 C) 98.5 F (36.9 C)  TempSrc: Oral  Oral Oral  SpO2: 99% 99% 96% 98%  Weight:      Height:        Intake/Output Summary (Last 24 hours) at 05/07/2020 1421 Last data filed at 05/07/2020 0524 Gross per 24 hour  Intake 130 ml  Output 185 ml  Net -55 ml   Filed Weights   04/29/20 0500 05/02/20 0358 05/03/20 0547  Weight: 134.4 kg 135.2 kg 130.9 kg    Examination:  General exam: Appears lethargic  Respiratory system: decreased breath sounds b/l otherwise clear Cardiovascular system: S1 & S2 +. No rubs, gallops or clicks.  Gastrointestinal system: Abdomen is obese, soft and nontender. Hypoactive bowel sounds  Central nervous system: Lethargic. Moves all 4 extremities  Psychiatry: Judgement and insight appear abnormal.  Flat mood and affect     Data Reviewed: I have personally reviewed following labs and imaging studies  CBC: Recent Labs  Lab 05/02/20 0945 05/03/20 0636 05/04/20 0543 05/05/20 0831 05/06/20 0701  WBC 23.1* 18.4* 11.0* 7.2 5.4  NEUTROABS 21.6* 16.9* 9.4* 5.9 4.1  HGB 7.2* 7.8* 6.7* 6.8* 7.8*  HCT 21.4* 23.6* 19.5* 20.6* 23.3*  MCV 90.7 93 89.9 91.6 89.6  PLT 152 131* 162 216 287   Basic Metabolic Panel: Recent Labs  Lab 05/01/20 0740 05/01/20 0740 05/02/20 0945 05/03/20 0636 05/04/20 0543 05/05/20 0831 05/06/20 0701  NA 134*  --  134*  --  136 139 139  K 4.1  --  4.2  --  3.7 3.9 4.1  CL 96*  --   97*  --  98 101 99  CO2 25  --  24  --  25 26 27   GLUCOSE 73  --  76  --  93 103* 101*  BUN 55*  --  67*  --  52* 66* 72*  CREATININE 3.93*  --  4.71*  --  4.15* 4.66* 4.96*  CALCIUM 7.6*  --  7.8*  --  7.6* 7.5* 7.7*  MG 1.7   < > 1.8 1.6* 1.8 1.9 2.1  PHOS 6.2*  --  7.0*  --  5.8* 6.4* 7.5*   < > = values in this interval not displayed.   GFR: Estimated Creatinine Clearance: 17.2 mL/min (A) (by C-G formula based on SCr of 4.96 mg/dL (H)). Liver Function Tests: Recent Labs  Lab 05/01/20 0705 05/01/20 0705 05/02/20 0945 05/03/20 0636 05/04/20 0543 05/05/20 0831 05/06/20 0701  AST 28  --   --  27  --   --   --   ALT 17  --   --  18  --   --   --   ALKPHOS 97  --   --  101  --   --   --   BILITOT 1.4*  --   --  1.4*  --   --   --   PROT 5.3*  --   --  5.8*  --   --   --   ALBUMIN 1.4*   < > 1.4* 1.5* 1.3* 1.3* 1.3*   < > = values in this interval not displayed.   No results for input(s): LIPASE, AMYLASE in the last 168 hours. No results for input(s): AMMONIA in the last 168 hours. Coagulation Profile: No results for input(s): INR, PROTIME in the last 168 hours. Cardiac Enzymes: No results for input(s): CKTOTAL, CKMB, CKMBINDEX, TROPONINI in the last 168 hours. BNP (last 3 results) No results for input(s): PROBNP in the last 8760 hours. HbA1C: No results for input(s): HGBA1C in the last 72 hours. CBG: Recent Labs  Lab 05/03/20 0844  GLUCAP 78   Lipid Profile: No results for input(s): CHOL, HDL, LDLCALC, TRIG, CHOLHDL, LDLDIRECT in the last 72 hours. Thyroid Function Tests: No results for input(s): TSH, T4TOTAL, FREET4, T3FREE, THYROIDAB in the last 72 hours. Anemia Panel: No results for input(s): VITAMINB12, FOLATE, FERRITIN, TIBC, IRON, RETICCTPCT in the last 72 hours. Sepsis Labs: No results for input(s): PROCALCITON, LATICACIDVEN in the last 168 hours.  Recent Results (from the past 240 hour(s))  CULTURE, BLOOD (ROUTINE X 2) w Reflex to ID Panel     Status:  None   Collection Time: 05/01/20  2:50 PM   Specimen: BLOOD  Result Value Ref Range Status   Specimen Description BLOOD  BLOOD LEFT HAND  Final   Special Requests   Final    BOTTLES DRAWN AEROBIC AND ANAEROBIC Blood Culture adequate volume   Culture   Final    NO GROWTH 5 DAYS Performed at [facility], [address] [place], [place] [address]    Report Status 05/06/2020 FINAL  Final  CULTURE, BLOOD (ROUTINE X 2) w Reflex to ID Panel     Status: None   Collection Time: 05/01/20  2:51 PM   Specimen: BLOOD  Result Value Ref Range Status   Specimen Description BLOOD BLOOD RIGHT HAND  Final   Special Requests   Final    BOTTLES DRAWN AEROBIC AND ANAEROBIC Blood Culture adequate volume   Culture   Final    NO GROWTH 5 DAYS Performed at [facility], [address] [place], [place] [address]    Report Status 05/06/2020 FINAL  Final         Radiology Studies: No results found.      Scheduled Meds: . (feeding supplement) PROSource Plus  30 mL Oral TID BM  . acidophilus  1 capsule Oral Daily  . alteplase  2 mg Intracatheter Once  . alteplase  2 mg Intracatheter Once  . amiodarone  200 mg Oral BID  . Chlorhexidine Gluconate Cloth  6 each Topical Daily  . docusate sodium  200 mg Oral BID  . rilpivirine  25 mg Oral Q breakfast   And  . dolutegravir  50 mg Oral Q breakfast  . epoetin (EPOGEN/PROCRIT) injection  10,000 Units Intravenous Q M,W,F-HD  . famotidine  10 mg Oral Q1200  . feeding supplement (NEPRO CARB STEADY)  237 mL Oral TID BM  . guaiFENesin-dextromethorphan  10 mL Oral Q8H  . heparin injection (subcutaneous)  5,000 Units Subcutaneous Q8H  . magnesium oxide  400 mg Oral Daily  . mometasone-formoterol  2 puff Inhalation BID  . multivitamin  1 tablet Oral QHS  . pregabalin  25 mg Oral Daily  . sodium chloride flush  5 mL Intracatheter Q8H  . torsemide  40 mg Oral Daily   Continuous Infusions: . sodium chloride 250 mL (05/03/20 0043)  .  anidulafungin Stopped (05/06/20 2308)     LOS: 23 days    Time spent: 30 mins     Wyvonnia Dusky, MD [facility] Pager [phone]  If 7PM-7AM, please contact night-coverage [email or web address] 05/07/2020, 2:21 PM

## 2020-05-07 NOTE — Progress Notes (Signed)
OT Cancellation Note  Patient Details Name: Melanie Bowen MRN: [id number] DOB: Jan 02, 1961   Cancelled Treatment:    Reason Eval/Treat Not Completed: Patient at procedure or test/ unavailable. Pt currently at HD. OT will attempt again tomorrow for OT intervention.  Darleen Crocker, MS, OTR/L , CBIS ascom [phone]  05/07/20, 3:04 PM   05/07/2020, 3:04 PM

## 2020-05-07 NOTE — Consult Note (Signed)
[place] Nurse wound follow up Wound type:incontinence associated skin damage to bilateral skin folds.  New blisters have developed to posterior thighs. Patient has had exposure to stool and urine at times. Has purewick in place.  Measurement:  Scattered nonintact skin and intact serum filled blisters.  Wound bed: pink and moist Drainage (amount, consistency, odor) minimal serosanguinous  No odor.  Periwound:intact Dressing procedure/placement/frequency:Keep skin clean and dry.  Patient is on low air loss mattress   Will implement Xeroform gauze to open wounds and cover with foam.  Change every three days and PRN soilage.  Will not follow at this time.  Please re-consult if needed.  Domenic Moras MSN, RN, FNP-BC CWON Wound, Ostomy, Continence Nurse Pager [phone]

## 2020-05-07 NOTE — Progress Notes (Signed)
[facility]  ROUNDING NOTE   Subjective:   Patient continues to be  drowsy and lethargic.She is arousable to call, reports poor oral intake.Plan to dialyze today.     Objective:  Vital signs in last 24 hours:  Temp:  [97.9 F (36.6 C)-99.5 F (37.5 C)] 97.9 F (36.6 C) (09/08 0324) Pulse Rate:  [93-103] 103 (09/08 0324) Resp:  [19-24] 20 (09/08 0324) BP: (94-126)/(54-70) 126/69 (09/08 0324) SpO2:  [96 %-99 %] 96 % (09/08 0324)  Weight change:  Filed Weights   04/29/20 0500 05/02/20 0358 05/03/20 0547  Weight: 134.4 kg 135.2 kg 130.9 kg    Intake/Output: I/O last 3 completed shifts: In: 516.1 [P.O.:240; Other:0.5; IV Piggyback:275.6] Out: 335 [Urine:300; Drains:35]   Intake/Output this shift:  No intake/output data recorded.  Physical Exam: General: Lethargic, drowsy  Lungs:  Fine crackles + bilaterally, diminished at the bases  Heart: Regular  Abdomen:  Obese, nontender  Extremities:  Trace peripheral edema.  Neurologic:  Confused, follows commands  Skin  Left inner thigh with blister  Access: RIJ permcath 8/27 Dr. [name]    Basic Metabolic Panel: Recent Labs  Lab 05/01/20 0740 05/01/20 0740 05/02/20 0945 05/02/20 0945 05/03/20 0636 05/04/20 0543 05/05/20 0831 05/06/20 0701  NA 134*  --  134*  --   --  136 139 139  K 4.1  --  4.2  --   --  3.7 3.9 4.1  CL 96*  --  97*  --   --  98 101 99  CO2 25  --  24  --   --  25 26 27   GLUCOSE 73  --  76  --   --  93 103* 101*  BUN 55*  --  67*  --   --  52* 66* 72*  CREATININE 3.93*  --  4.71*  --   --  4.15* 4.66* 4.96*  CALCIUM 7.6*   < > 7.8*   < >  --  7.6* 7.5* 7.7*  MG 1.7   < > 1.8  --  1.6* 1.8 1.9 2.1  PHOS 6.2*  --  7.0*  --   --  5.8* 6.4* 7.5*   < > = values in this interval not displayed.    Liver Function Tests: Recent Labs  Lab 05/01/20 0705 05/01/20 0705 05/02/20 0945 05/03/20 0636 05/04/20 0543 05/05/20 0831 05/06/20 0701  AST 28  --   --  27  --   --   --   ALT 17  --    --  18  --   --   --   ALKPHOS 97  --   --  101  --   --   --   BILITOT 1.4*  --   --  1.4*  --   --   --   PROT 5.3*  --   --  5.8*  --   --   --   ALBUMIN 1.4*   < > 1.4* 1.5* 1.3* 1.3* 1.3*   < > = values in this interval not displayed.   No results for input(s): LIPASE, AMYLASE in the last 168 hours. No results for input(s): AMMONIA in the last 168 hours.  CBC: Recent Labs  Lab 05/02/20 0945 05/03/20 0636 05/04/20 0543 05/05/20 0831 05/06/20 0701  WBC 23.1* 18.4* 11.0* 7.2 5.4  NEUTROABS 21.6* 16.9* 9.4* 5.9 4.1  HGB 7.2* 7.8* 6.7* 6.8* 7.8*  HCT 21.4* 23.6* 19.5* 20.6* 23.3*  MCV 90.7 93  89.9 91.6 89.6  PLT 152 131* 162 216 274    Cardiac Enzymes: No results for input(s): CKTOTAL, CKMB, CKMBINDEX, TROPONINI in the last 168 hours.  BNP: Invalid input(s): POCBNP  CBG: Recent Labs  Lab 05/03/20 0844  GLUCAP 78    Microbiology: Results for orders placed or performed during the hospital encounter of 04/14/20  SARS Coronavirus 2 by RT PCR (hospital order, performed in [facility] hospital lab) Nasopharyngeal Nasopharyngeal Swab     Status: None   Collection Time: 04/14/20  2:17 AM   Specimen: Nasopharyngeal Swab  Result Value Ref Range Status   SARS Coronavirus 2 NEGATIVE NEGATIVE Final    Comment: (NOTE) SARS-CoV-2 target nucleic acids are NOT DETECTED.  The SARS-CoV-2 RNA is generally detectable in upper and lower respiratory specimens during the acute phase of infection. The lowest concentration of SARS-CoV-2 viral copies this assay can detect is 250 copies / mL. A negative result does not preclude SARS-CoV-2 infection and should not be used as the sole basis for treatment or other patient management decisions.  A negative result may occur with improper specimen collection / handling, submission of specimen other than nasopharyngeal swab, presence of viral mutation(s) within the areas targeted by this assay, and inadequate number of viral copies (<250  copies / mL). A negative result must be combined with clinical observations, patient history, and epidemiological information.  Fact Sheet for Patients:   StrictlyIdeas.no  Fact Sheet for Healthcare Providers: BankingDealers.co.za  This test is not yet approved or  cleared by the [place] FDA and has been authorized for detection and/or diagnosis of SARS-CoV-2 by FDA under an Emergency Use Authorization (EUA).  This EUA will remain in effect (meaning this test can be used) for the duration of the COVID-19 declaration under Section 564(b)(1) of the Act, 21 U.S.C. section 360bbb-3(b)(1), unless the authorization is terminated or revoked sooner.  Performed at [facility], [address] [place], [place] [address]   CULTURE, BLOOD (ROUTINE X 2) w Reflex to ID Panel     Status: None   Collection Time: 04/14/20  8:14 AM   Specimen: BLOOD  Result Value Ref Range Status   Specimen Description BLOOD LEFT [facility]  Final   Special Requests   Final    BOTTLES DRAWN AEROBIC AND ANAEROBIC Blood Culture adequate volume   Culture   Final    NO GROWTH 5 DAYS Performed at [facility], [address] [place], [place] [address]    Report Status 04/19/2020 FINAL  Final  CULTURE, BLOOD (ROUTINE X 2) w Reflex to ID Panel     Status: None   Collection Time: 04/14/20  8:14 AM   Specimen: BLOOD  Result Value Ref Range Status   Specimen Description BLOOD LEFT WRIST  Final   Special Requests   Final    BOTTLES DRAWN AEROBIC AND ANAEROBIC Blood Culture adequate volume   Culture   Final    NO GROWTH 5 DAYS Performed at [facility] [address], [place], [place] [address]    Report Status 04/19/2020 FINAL  Final  Aerobic/Anaerobic Culture (surgical/deep wound)     Status: None   Collection Time: 04/14/20 11:14 AM   Specimen: Abscess  Result Value Ref Range Status   Specimen Description   Final    ABSCESS Performed  at [facility], [address] [place], [place] [address]    Special Requests   Final    NONE Performed at [facility],  [place], [place] [address]    Gram Stain   Final    NO WBC SEEN ABUNDANT GRAM NEGATIVE RODS FEW GRAM POSITIVE COCCI FEW GRAM POSITIVE RODS Performed at [facility], [place] [address], [place], [place] [address]    Culture   Final    ABUNDANT KLEBSIELLA PNEUMONIAE ABUNDANT PSEUDOMONAS AERUGINOSA MODERATE ESCHERICHIA COLI Confirmed Extended Spectrum Beta-Lactamase Producer (ESBL).  In bloodstream infections from ESBL organisms, carbapenems are preferred over piperacillin/tazobactam. They are shown to have a lower risk of mortality. MIXED ANAEROBIC FLORA PRESENT.  CALL LAB IF FURTHER IID REQUIRED.    Report Status 04/20/2020 FINAL  Final   Organism ID, Bacteria KLEBSIELLA PNEUMONIAE  Final   Organism ID, Bacteria PSEUDOMONAS AERUGINOSA  Final   Organism ID, Bacteria ESCHERICHIA COLI  Final      Susceptibility   Escherichia coli - MIC*    AMPICILLIN >=32 RESISTANT Resistant     CEFAZOLIN >=64 RESISTANT Resistant     CEFEPIME 16 RESISTANT Resistant     CEFTAZIDIME RESISTANT Resistant     CEFTRIAXONE >=64 RESISTANT Resistant     CIPROFLOXACIN >=4 RESISTANT Resistant     GENTAMICIN <=1 SENSITIVE Sensitive     IMIPENEM <=0.25 SENSITIVE Sensitive     TRIMETH/SULFA >=320 RESISTANT Resistant     AMPICILLIN/SULBACTAM >=32 RESISTANT Resistant     PIP/TAZO 8 SENSITIVE Sensitive     * MODERATE ESCHERICHIA COLI   Klebsiella pneumoniae - MIC*    AMPICILLIN >=32 RESISTANT Resistant     CEFAZOLIN <=4 SENSITIVE Sensitive     CEFEPIME <=0.12 SENSITIVE Sensitive     CEFTAZIDIME <=1 SENSITIVE Sensitive     CEFTRIAXONE <=0.25 SENSITIVE Sensitive     CIPROFLOXACIN <=0.25 SENSITIVE Sensitive     GENTAMICIN <=1 SENSITIVE Sensitive     IMIPENEM <=0.25 SENSITIVE Sensitive     TRIMETH/SULFA <=20 SENSITIVE Sensitive      AMPICILLIN/SULBACTAM 16 INTERMEDIATE Intermediate     PIP/TAZO 8 SENSITIVE Sensitive     * ABUNDANT KLEBSIELLA PNEUMONIAE   Pseudomonas aeruginosa - MIC*    CEFTAZIDIME 4 SENSITIVE Sensitive     CIPROFLOXACIN <=0.25 SENSITIVE Sensitive     GENTAMICIN 2 SENSITIVE Sensitive     IMIPENEM 2 SENSITIVE Sensitive     PIP/TAZO 8 SENSITIVE Sensitive     CEFEPIME 4 SENSITIVE Sensitive     * ABUNDANT PSEUDOMONAS AERUGINOSA  MRSA PCR Screening     Status: None   Collection Time: 04/14/20  6:19 PM   Specimen: Nasopharyngeal  Result Value Ref Range Status   MRSA by PCR NEGATIVE NEGATIVE Final    Comment:        The GeneXpert MRSA Assay (FDA approved for NASAL specimens only), is one component of a comprehensive MRSA colonization surveillance program. It is not intended to diagnose MRSA infection nor to guide or monitor treatment for MRSA infections. Performed at [facility], [address], [place], [place] [address]   Urine Culture     Status: None   Collection Time: 04/15/20  4:28 AM   Specimen: Urine, Random  Result Value Ref Range Status   Specimen Description   Final    URINE, RANDOM Performed at [facility], [address], [place], [place] [address]    Special Requests   Final    NONE Performed at [facility], [address], [place], [place] [address]    Culture   Final    NO GROWTH Performed at [facility],  [address] [place], [place] [address]    Report Status 04/16/2020 FINAL  Final  CULTURE, BLOOD (ROUTINE X 2) w Reflex to ID Panel     Status: None   Collection Time: 04/22/20  6:12 PM   Specimen: BLOOD  Result Value Ref Range Status   Specimen Description BLOOD FOOT  Final   Special Requests   Final    BOTTLES DRAWN AEROBIC AND ANAEROBIC Blood Culture adequate volume   Culture   Final    NO GROWTH 5 DAYS Performed at [facility], [address] [place], [place] [address]    Report Status 04/27/2020 FINAL   Final  CULTURE, BLOOD (ROUTINE X 2) w Reflex to ID Panel     Status: None   Collection Time: 04/22/20  6:17 PM   Specimen: BLOOD  Result Value Ref Range Status   Specimen Description BLOOD FOOT  Final   Special Requests   Final    BOTTLES DRAWN AEROBIC AND ANAEROBIC Blood Culture results may not be optimal due to an excessive volume of blood received in culture bottles   Culture   Final    NO GROWTH 5 DAYS Performed at [facility], [address] [place], [place] [address]    Report Status 04/27/2020 FINAL  Final  CULTURE, BLOOD (ROUTINE X 2) w Reflex to ID Panel     Status: None   Collection Time: 05/01/20  2:50 PM   Specimen: BLOOD  Result Value Ref Range Status   Specimen Description BLOOD BLOOD LEFT HAND  Final   Special Requests   Final    BOTTLES DRAWN AEROBIC AND ANAEROBIC Blood Culture adequate volume   Culture   Final    NO GROWTH 5 DAYS Performed at [facility], [address] [place], [place] [address]    Report Status 05/06/2020 FINAL  Final  CULTURE, BLOOD (ROUTINE X 2) w Reflex to ID Panel     Status: None   Collection Time: 05/01/20  2:51 PM   Specimen: BLOOD  Result Value Ref Range Status   Specimen Description BLOOD BLOOD RIGHT HAND  Final   Special Requests   Final    BOTTLES DRAWN AEROBIC AND ANAEROBIC Blood Culture adequate volume   Culture   Final    NO GROWTH 5 DAYS Performed at [facility], [address] [place], [place] [address]    Report Status 05/06/2020 FINAL  Final    Coagulation Studies: No results for input(s): LABPROT, INR in the last 72 hours.  Urinalysis: No results for input(s): COLORURINE, LABSPEC, PHURINE, GLUCOSEU, HGBUR, BILIRUBINUR, KETONESUR, PROTEINUR, UROBILINOGEN, NITRITE, LEUKOCYTESUR in the last 72 hours.  Invalid input(s): APPERANCEUR    Imaging: No results found.   Medications:   . sodium chloride 250 mL (05/03/20 0043)  . anidulafungin Stopped (05/06/20 2308)   . (feeding  supplement) PROSource Plus  30 mL Oral TID BM  . acidophilus  1 capsule Oral Daily  . alteplase  2 mg Intracatheter Once  . alteplase  2 mg Intracatheter Once  . amiodarone  200 mg Oral BID  . Chlorhexidine Gluconate Cloth  6 each Topical Daily  . docusate sodium  200 mg Oral BID  . epoetin (EPOGEN/PROCRIT) injection  10,000 Units Intravenous Q M,W,F-HD  . famotidine  10 mg Oral Q1200  . feeding supplement (NEPRO CARB STEADY)  237 mL Oral TID BM  . guaiFENesin-dextromethorphan  10 mL Oral Q8H  . heparin injection (subcutaneous)  5,000 Units Subcutaneous  Q8H  . magnesium oxide  400 mg Oral Daily  . mometasone-formoterol  2 puff Inhalation BID  . multivitamin  1 tablet Oral QHS  . pregabalin  25 mg Oral Daily  . sodium chloride flush  5 mL Intracatheter Q8H  . torsemide  40 mg Oral Daily   acetaminophen **OR** acetaminophen, albuterol, ALPRAZolam, bisacodyl, diphenhydrAMINE, heparin NICU/SCN flush, HYDROmorphone (DILAUDID) injection, iohexol, ipratropium-albuterol, labetalol, magic mouthwash **AND** lidocaine, magnesium hydroxide, menthol-cetylpyridinium, oxyCODONE-acetaminophen, phenol, simethicone  Assessment/ Plan:  Melanie Bowen is a 59 y.o. black female with HIV, COPD, hypertension, GERD, who is admitted to [facility] on 04/14/2020 for Dyspnea [R06.00] Colonic diverticular abscess [K57.20] Diverticulitis of large intestine with abscess without bleeding [K57.20] Abscess of sigmoid colon due to diverticulitis [K57.20]  #Acute renal failure   No history of chronic kidney disease.   Requiring hemodialysis. First treatment on 8/18.  IV contrast exposure on 8/16. Ultrasound -negative for obstruction or mass.   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  Lab Results  Component Value Date   CREATININE 4.96 (H) 05/06/2020   CREATININE 4.66 (H) 05/05/2020   CREATININE 4.15 (H) 05/04/2020   Plan for dialysis today Will continue  monitoring daily for dialysis requirement Trial of HD in chair prior to discharge    #. Anemia of chronic kidney disease:  Lab Results  Component Value Date   HGB 7.8 (L) 05/06/2020   -Continue Epogen with dialysis treatments - Low threshold for PRBC transfusion.   #Lower extremity edema Continue oral torsemide  #Fever Receiving Anidulafungin IV,started on 05/03/20 Will continue monitoring for sepsis    LOS: 23 Melanie Bowen 9/8/202111:52 AM

## 2020-05-07 NOTE — Progress Notes (Addendum)
   05/07/20 1300  Clinical Encounter Type  Visited With Patient  Visit Type Initial  Referral From Nurse  Consult/Referral To Chaplain  Per recommendation of Karle Starch, [employer] of nursing, chaplain stopped by to check on patient. When chaplain arrived patient was sleeping, but chaplain woke her up. Chaplain had a hard time understanding what patient was saying. Pt's cell phone rung and chaplain handed pt the phone, se briefly spoke to her daughter. Pt mentioned juice an water because of something with her throat. Chaplain asked nurse aout pt sitting up in the chair and she wasn't sure how that would work. Chaplain also asked nurse about water for pt and she said pt could have water.

## 2020-05-07 NOTE — NC FL2 (Signed)
[place] LEVEL OF CARE SCREENING TOOL     IDENTIFICATION  Patient Name: Melanie Bowen Birthdate: 18-May-1961 Sex: female Admission Date (Current Location): 04/14/2020  [place] and [place] Number:  Engineering geologist and Address:  [facility], [address], [place], [place] [address]      Provider Number:    Attending Physician Name and Address:  Wyvonnia Dusky, MD  Relative Name and Phone Number:  Tammy/sister [phone]    Current Level of Care: Hospital Recommended Level of Care: [place] Prior Approval Number:    Date Approved/Denied:   PASRR Number: [id number]  Discharge Plan: SNF    Current Diagnoses: Patient Active Problem List   Diagnosis Date Noted  . Goals of care, counseling/discussion   . Dyspnea   . Acute renal failure (ARF) ([place]) 04/16/2020  . Severe sepsis with septic shock ([place]) 04/16/2020  . Hypotension 04/16/2020  . Diverticulosis 04/16/2020  . Acute respiratory distress   . DNR (do not resuscitate) discussion   . Palliative care by specialist   . Abscess of sigmoid colon due to diverticulitis 04/14/2020  . Multifocal pneumonia 11/06/2019  . AKI (acute kidney injury) ([place]) 11/06/2019  . Leukocytosis 11/06/2019  . Diverticulitis of large intestine with abscess without bleeding 01/17/2019  . Morbid (severe) obesity due to excess calories ([place]) 11/26/2018  . Violation of controlled substance agreement 12/05/2017  . Positive urine drug screen (+cocaine) 12/05/2017  . Cervicalgia 12/05/2017  . Drug-seeking behavior 12/05/2017  . Community acquired pneumonia 11/28/2016  . Pneumonia 11/28/2016  . Allergic rhinitis 05/27/2016  . Tobacco abuse counseling 02/11/2016  . Symptomatic anemia   . Lower GI bleed   . Anemia 01/16/2016  . GI bleed 01/10/2016  . GERD (gastroesophageal reflux disease) 01/10/2016  . HTN (hypertension) 01/10/2016  . Depression 01/10/2016  . HIV (human  immunodeficiency virus infection) ([place]) 01/10/2016  . Lumbar pseudoarthrosis 10/31/2015  . Chronic cough   . Cough 06/19/2015  . DDD (degenerative disc disease), lumbar 12/31/2014  . Degenerative disc disease, lumbar 12/31/2014  . Asthma, chronic 11/12/2014    Orientation RESPIRATION BLADDER Height & Weight     Self, Place    External catheter, Incontinent Weight: 288 lb 8 oz (130.9 kg) Height:  5\' 6"  (167.6 cm)  BEHAVIORAL SYMPTOMS/MOOD NEUROLOGICAL BOWEL NUTRITION STATUS      Continent    AMBULATORY STATUS COMMUNICATION OF NEEDS Skin   Extensive Assist Verbally Skin abrasions                       Personal Care Assistance Level of Assistance  Bathing, Feeding, Total care Bathing Assistance: Maximum assistance Feeding assistance: Limited assistance   Total Care Assistance: Maximum assistance   Functional Limitations Info             SPECIAL CARE FACTORS FREQUENCY  PT (By licensed PT), OT (By licensed OT)     PT Frequency: 5 x weekly OT Frequency: 5 x weekly            Contractures      Additional Factors Info  Code Status Code Status Info: Partial             Current Medications (05/07/2020):  This is the current hospital active medication list Current Facility-Administered Medications  Medication Dose Route Frequency Provider Last Rate Last Admin  . (feeding supplement) PROSource Plus liquid 30 mL  30 mL Oral TID BM Jennye Boroughs, MD  30 mL at 05/06/20 1531  . 0.9 %  sodium chloride infusion  250 mL Intravenous Continuous Algernon Huxley, MD 10 mL/hr at 05/03/20 0043 250 mL at 05/03/20 0043  . acetaminophen (TYLENOL) tablet 650 mg  650 mg Oral Q6H PRN Algernon Huxley, MD   650 mg at 05/05/20 1049   Or  . acetaminophen (TYLENOL) suppository 650 mg  650 mg Rectal Q6H PRN Algernon Huxley, MD      . acidophilus (RISAQUAD) capsule 1 capsule  1 capsule Oral Daily Algernon Huxley, MD   1 capsule at 05/06/20 1011  . albuterol (PROVENTIL) (2.5 MG/3ML) 0.083%  nebulizer solution 2.5 mg  2.5 mg Nebulization Q4H PRN Algernon Huxley, MD      . ALPRAZolam Duanne Moron) tablet 1 mg  1 mg Oral TID PRN Algernon Huxley, MD   1 mg at 05/02/20 1056  . alteplase (CATHFLO ACTIVASE) injection 2 mg  2 mg Intracatheter Once Algernon Huxley, MD      . alteplase (CATHFLO ACTIVASE) injection 2 mg  2 mg Intracatheter Once Algernon Huxley, MD      . amiodarone (PACERONE) tablet 200 mg  200 mg Oral BID Algernon Huxley, MD   200 mg at 05/06/20 2129  . anidulafungin (ERAXIS) 100 mg in sodium chloride 0.9 % 100 mL IVPB  100 mg Intravenous Q24H Tsosie Billing, MD   Stopped at 05/06/20 2308  . bisacodyl (DULCOLAX) EC tablet 10 mg  10 mg Oral Daily PRN Algernon Huxley, MD   10 mg at 05/03/20 1558  . Chlorhexidine Gluconate Cloth 2 % PADS 6 each  6 each Topical Daily Algernon Huxley, MD   6 each at 05/06/20 1704  . diphenhydrAMINE (BENADRYL) capsule 25 mg  25 mg Oral Q8H PRN Jennye Boroughs, MD      . docusate sodium (COLACE) capsule 200 mg  200 mg Oral BID Algernon Huxley, MD   200 mg at 05/06/20 1011  . epoetin alfa (EPOGEN) injection 10,000 Units  10,000 Units Intravenous Q M,W,F-HD Kolluru, Sarath, MD      . famotidine (PEPCID) tablet 10 mg  10 mg Oral Q1200 Lu Duffel, RPH   10 mg at 05/06/20 1534  . feeding supplement (NEPRO CARB STEADY) liquid 237 mL  237 mL Oral TID BM Jennye Boroughs, MD   237 mL at 05/06/20 2128  . guaiFENesin-dextromethorphan (ROBITUSSIN DM) 100-10 MG/5ML syrup 10 mL  10 mL Oral Q8H Algernon Huxley, MD   10 mL at 05/06/20 0619  . heparin injection 5,000 Units  5,000 Units Subcutaneous Q8H Algernon Huxley, MD   5,000 Units at 05/07/20 0520  . heparin SCN flush for CVL/PCVC (NS + heparin 1 unit/ml)  0.5-1.7 mL Intravenous PRN Algernon Huxley, MD   1.4 mL at 04/16/20 1717  . HYDROmorphone (DILAUDID) injection 0.5-1 mg  0.5-1 mg Intravenous Q2H PRN Algernon Huxley, MD   1 mg at 05/06/20 1027  . iohexol (OMNIPAQUE) 9 MG/ML oral solution 1,000 mL  1,000 mL Oral Once PRN Pabon, Diego F,  MD      . ipratropium-albuterol (DUONEB) 0.5-2.5 (3) MG/3ML nebulizer solution 3 mL  3 mL Nebulization Q6H PRN Algernon Huxley, MD   3 mL at 05/05/20 0911  . labetalol (NORMODYNE) injection 20 mg  20 mg Intravenous Q3H PRN Algernon Huxley, MD   20 mg at 05/05/20 1759  . magic mouthwash  5 mL Oral QID PRN Leotis Pain  S, MD       And  . lidocaine (XYLOCAINE) 2 % viscous mouth solution 15 mL  15 mL Mouth/Throat QID PRN Algernon Huxley, MD   15 mL at 05/03/20 1059  . magnesium hydroxide (MILK OF MAGNESIA) suspension 30 mL  30 mL Oral Daily PRN Algernon Huxley, MD   30 mL at 05/03/20 1558  . magnesium oxide (MAG-OX) tablet 400 mg  400 mg Oral Daily Jennye Boroughs, MD   400 mg at 05/06/20 1012  . menthol-cetylpyridinium (CEPACOL) lozenge 3 mg  1 lozenge Oral PRN Algernon Huxley, MD   3 mg at 04/24/20 1059  . mometasone-formoterol (DULERA) 200-5 MCG/ACT inhaler 2 puff  2 puff Inhalation BID Algernon Huxley, MD   2 puff at 05/06/20 1000  . multivitamin (RENA-VIT) tablet 1 tablet  1 tablet Oral QHS Algernon Huxley, MD   1 tablet at 05/06/20 2130  . oxyCODONE-acetaminophen (PERCOCET) 7.5-325 MG per tablet 1 tablet  1 tablet Oral Q6H PRN Wyvonnia Dusky, MD   1 tablet at 05/04/20 0106  . phenol (CHLORASEPTIC) mouth spray 1 spray  1 spray Mouth/Throat PRN Algernon Huxley, MD   1 spray at 04/24/20 1543  . pregabalin (LYRICA) capsule 25 mg  25 mg Oral Daily Algernon Huxley, MD   25 mg at 05/06/20 1013  . simethicone (MYLICON) chewable tablet 80 mg  80 mg Oral QID PRN Algernon Huxley, MD   80 mg at 04/29/20 0918  . sodium chloride flush (NS) 0.9 % injection 5 mL  5 mL Intracatheter Q8H Algernon Huxley, MD   5 mL at 05/07/20 0520  . torsemide (DEMADEX) tablet 40 mg  40 mg Oral Daily Murlean Iba, MD   40 mg at 05/06/20 1012     Discharge Medications: Please see discharge summary for a list of discharge medications.  Relevant Imaging Results:  Relevant Lab Results:   Additional Information SS# [id number]  Meriel Flavors,  LCSW

## 2020-05-07 NOTE — Progress Notes (Signed)
Physical Therapy Treatment Patient Details Name: Melanie Bowen MRN: [id number] DOB: [birth year] Today's Date: 05/07/2020    History of Present Illness Melanie Bowen is a 62yoF who comes to [facility] on 8/16 c LLQ ABD pain. Pt admitted c Acute sigmoid diverticulitis with diverticular abscess. PMH: COPD, asthma, HTN, diverticulosis, HIV, GERD, depression, bilat TKA, 3 level lumbar fusion, susequent lumbar hardware removal. Pt moved to ICU early in stay, inititated on HD on 04/16/20, temp cath removed 8/25. Pt  has had AF RVR issues. Pt also reports a subacute Rt rotator cuff injury with significant pain and disability, scheduled for repair with [name] in mid September.    PT Comments    Pt was in in semi-fowlers position upon arriving. She continues to be lethargic however is awake and answers questions with increased time. Less resistive to PT session this date. Agrees to trial sitting EOB. Max deflated bed to make bed mobility easier. Pt log rolled L to short sit with max assist and max vcs. Was able to maintaining sitting EOB x 5 minutes with constant mod assist to prevent posterior L LOB. Did perform several seated exercises however pt continues to be very weak throughout all extremities. HR elevated from 105 bpm at rest to 130 bpm in sitting however pt tolerated well. Very deconditioned. Pt will benefit from continued skilled PT going forward. Recommend +2 assistance with all mobility for safety. Will need rehab at DC. RN and RN tech in room at conclusion of session.       Follow Up Recommendations  SNF     Equipment Recommendations  Other (comment) (defer to next level of care)    Recommendations for Other Services       Precautions / Restrictions Precautions Precautions: Fall Precaution Comments: femoral cath removed Restrictions Weight Bearing Restrictions: No    Mobility  Bed Mobility Overal bed mobility: Needs Assistance Bed Mobility: Supine to Sit;Sit to Supine Rolling:  Max assist;+2 for safety/equipment   Supine to sit: Max assist Sit to supine: Max assist   General bed mobility comments: Max assist to progress from supine to side-lying to short sit. Max vcs for full participation and encouragement. pt did tolerate EOB sitting x 5 minutes. HR elevated to 130 in sitting.  Transfers        General transfer comment: unsafe at this time 2/2 to weakness throughout.      Balance Overall balance assessment: Needs assistance Sitting-balance support: Feet supported;Bilateral upper extremity supported Sitting balance-Leahy Scale: Poor Sitting balance - Comments: constant mod assist to prevent LOB in sitting. pt did tolerate sitting up for 5 minutes with focus on improving indepence with sitting balance. she performed several Active LAQ through minimal ROM. Postural control: Posterior lean;Left lateral lean             Cognition Arousal/Alertness: Lethargic Behavior During Therapy:  (Lethargic. Is awake throughout but opens eyes rarely) Overall Cognitive Status: Within Functional Limits for tasks assessed        General Comments: Pt was more agreeable today but still requires encouragement.       Exercises General Exercises - Lower Extremity Long Arc Quad: AROM;Both;5 reps;Seated        Pertinent Vitals/Pain Pain Assessment: No/denies pain Pain Score: 0-No pain Faces Pain Scale: No hurt Pain Intervention(s): Limited activity within patient's tolerance;Monitored during session;Repositioned           PT Goals (current goals can now be found in the care plan section) Acute Rehab PT Goals  Patient Stated Goal: go home Progress towards PT goals: Progressing toward goals    Frequency    Min 2X/week      PT Plan Current plan remains appropriate       AM-PAC PT "6 Clicks" Mobility   Outcome Measure  Help needed turning from your back to your side while in a flat bed without using bedrails?: A Lot Help needed moving from lying on  your back to sitting on the side of a flat bed without using bedrails?: A Lot Help needed moving to and from a bed to a chair (including a wheelchair)?: Total Help needed standing up from a chair using your arms (e.g., wheelchair or bedside chair)?: Total Help needed to walk in hospital room?: Total Help needed climbing 3-5 steps with a railing? : Total 6 Click Score: 8    End of Session Equipment Utilized During Treatment: Oxygen Activity Tolerance: Patient limited by fatigue Patient left: in bed;with call bell/phone within reach;with bed alarm set Nurse Communication: Mobility status PT Visit Diagnosis: Unsteadiness on feet (R26.81);Other abnormalities of gait and mobility (R26.89);Muscle weakness (generalized) (M62.81);Difficulty in walking, not elsewhere classified (R26.2)     Time: 9144-4584 PT Time Calculation (min) (ACUTE ONLY): 25 min  Charges:  $Therapeutic Activity: 8-22 mins $Neuromuscular Re-education: 8-22 mins                     Julaine Fusi PTA 05/07/20, 9:09 AM

## 2020-05-07 NOTE — Progress Notes (Signed)
ID  Patient Vitals for the past 24 hrs:  BP Temp Temp src Pulse Resp SpO2  05/07/20 0324 126/69 97.9 F (36.6 C) Oral (!) 103 20 96 %  05/07/20 0025 119/70 98.4 F (36.9 C) -- (!) 103 20 99 %  05/06/20 2017 124/67 97.9 F (36.6 C) Oral 98 (!) 24 99 %  05/06/20 1646 (!) 109/56 97.9 F (36.6 C) -- 93 -- 99 %  05/06/20 1221 (!) 94/54 99.5 F (37.5 C) Oral 94 19 97 %    Pt more alert  and responding to questions appropriately She is deconditioned and needs to have PT Also b/l crepts Need incentive spirometry Abd soft distension- Drain present  CBC Latest Ref Rng & Units 05/06/2020 05/05/2020 05/04/2020  WBC 4.0 - 10.5 K/uL 5.4 7.2 11.0(H)  Hemoglobin 12.0 - 15.0 g/dL 7.8(L) 6.8(L) 6.7(L)  Hematocrit 36 - 46 % 23.3(L) 20.6(L) 19.5(L)  Platelets 150 - 400 K/uL 274 216 162    CMP Latest Ref Rng & Units 05/06/2020 05/05/2020 05/04/2020  Glucose 70 - 99 mg/dL 101(H) 103(H) 93  BUN 6 - 20 mg/dL 72(H) 66(H) 52(H)  Creatinine 0.44 - 1.00 mg/dL 4.96(H) 4.66(H) 4.15(H)  Sodium 135 - 145 mmol/L 139 139 136  Potassium 3.5 - 5.1 mmol/L 4.1 3.9 3.7  Chloride 98 - 111 mmol/L 99 101 98  CO2 22 - 32 mmol/L 27 26 25   Calcium 8.9 - 10.3 mg/dL 7.7(L) 7.5(L) 7.6(L)  Total Protein 6.5 - 8.1 g/dL - - -  Total Bilirubin 0.3 - 1.2 mg/dL - - -  Alkaline Phos 38 - 126 U/L - - -  AST 15 - 41 U/L - - -  ALT 0 - 44 U/L - - -    Impression/recommendation Septic shocksecondary to perforated diverticulitisand peridiverticular abscess. pseudomonas, kleb andESBLe.coli in the culture. JP drain inserted on 04/14/20.  Onmeropenemsince 04/15/20>> day  21 of meropenem- stopped  05/06/20 anidulafungin 8/17>>8/24 and restarted on 05/02/20 for fever and leucocytosis- both have now resolved  Encephalopathy-metabolic -improving  Hypoxic resp failure on presentation-needed intubation for a few days s/p extubation  Needs incentive spirometry     Transaminitis due to shock liverresolved  AKI due to septic  shock-getting dialysis  Anasarca Rt arm swelling- DVT was ruled out on 04/22/20- may have to repeat imaging   HIV- well controlled in jan 2021 her Vl <20 and cd4 >1000.  WasOn Genvoya. This has 4 different meds and as  we need to adjust the dose because of AKI. Chose a Non NRTI regimen to dolutegravir+rilpavirine on 8/ instead Dolutegravir + prezcobix ( darunavir+ cobi)VS  Because of encephalopathy and worsening clinical picture Dolutegravir and rilpivarine on hold since 05/02/20. But I dont think that is the reason for her encephalopathy- so will restart  CD4 count is 265 with 33% on 8/16/2021and Vl <20 On 05/03/20 Vl is < 20 Cd4 301 ( 50%) restrt dolutegravir and rilpivirine as they     Afib on amiodarone   Transaminitis due to shock liverresolved  Thrombocytopenia-resolved  Discussed the management with patint and pharmacist

## 2020-05-07 NOTE — TOC Progression Note (Signed)
Transition of Care [facility]) - Progression Note    Patient Details  Name: SHIRI HODAPP MRN: [id number] Date of Birth: [birth year]  [facility] CM/SW Contact  Meriel Flavors, LCSW Phone Number: 05/07/2020, 12:53 PM  Clinical Narrative:    CSW met with patient and family to discuss discharge plan, patient is being recommended for skilled nursing for rehab. Their preference is [facility], but are open to other places if needed. CSW confirmed and agreed to send out request for bed offers         Expected Discharge Plan and Services                                                 Social Determinants of Health (SDOH) Interventions    Readmission Risk Interventions Readmission Risk Prevention Plan 11/08/2019  Transportation Screening Complete  Medication Review Press photographer) Complete  PCP or Specialist appointment within 3-5 days of discharge Not Complete  PCP/Specialist Appt Not Complete comments Her PCP is out for 2-3 weeks so other two providers are booked out. The receptionist will call her if there is a cancellation.  [place] or Home Care Consult Complete  SW Recovery Care/Counseling Consult Complete  Palliative Care Screening Not Applicable  Skilled Nursing Facility Not Applicable  Some recent data might be hidden

## 2020-05-07 NOTE — Progress Notes (Addendum)
   05/07/20 1000  Clinical Encounter Type  Visited With Patient  Visit Type Initial  Referral From Chaplain  Consult/Referral To Chaplain  Chaplain stopped by to check on patient and she was sleeping. Chaplain will stop back by later.

## 2020-05-08 ENCOUNTER — Other Ambulatory Visit: Payer: Medicare Other

## 2020-05-08 DIAGNOSIS — G9341 Metabolic encephalopathy: Secondary | ICD-10-CM | POA: Diagnosis not present

## 2020-05-08 DIAGNOSIS — R6521 Severe sepsis with septic shock: Secondary | ICD-10-CM | POA: Diagnosis not present

## 2020-05-08 DIAGNOSIS — R630 Anorexia: Secondary | ICD-10-CM

## 2020-05-08 DIAGNOSIS — K5720 Diverticulitis of large intestine with perforation and abscess without bleeding: Secondary | ICD-10-CM | POA: Diagnosis not present

## 2020-05-08 DIAGNOSIS — A419 Sepsis, unspecified organism: Secondary | ICD-10-CM | POA: Diagnosis not present

## 2020-05-08 DIAGNOSIS — L8990 Pressure ulcer of unspecified site, unspecified stage: Secondary | ICD-10-CM | POA: Insufficient documentation

## 2020-05-08 LAB — MAGNESIUM: Magnesium: 1.8 mg/dL (ref 1.7–2.4)

## 2020-05-08 LAB — CBC
HCT: 24.2 % — ABNORMAL LOW (ref 36.0–46.0)
Hemoglobin: 8 g/dL — ABNORMAL LOW (ref 12.0–15.0)
MCH: 30.8 pg (ref 26.0–34.0)
MCHC: 33.1 g/dL (ref 30.0–36.0)
MCV: 93.1 fL (ref 80.0–100.0)
Platelets: 341 10*3/uL (ref 150–400)
RBC: 2.6 MIL/uL — ABNORMAL LOW (ref 3.87–5.11)
RDW: 17 % — ABNORMAL HIGH (ref 11.5–15.5)
WBC: 4.1 10*3/uL (ref 4.0–10.5)
nRBC: 1.5 % — ABNORMAL HIGH (ref 0.0–0.2)

## 2020-05-08 LAB — BASIC METABOLIC PANEL
Anion gap: 14 (ref 5–15)
BUN: 54 mg/dL — ABNORMAL HIGH (ref 6–20)
CO2: 27 mmol/L (ref 22–32)
Calcium: 7.3 mg/dL — ABNORMAL LOW (ref 8.9–10.3)
Chloride: 98 mmol/L (ref 98–111)
Creatinine, Ser: 3.87 mg/dL — ABNORMAL HIGH (ref 0.44–1.00)
GFR calc Af Amer: 14 mL/min — ABNORMAL LOW (ref >60)
GFR calc non Af Amer: 12 mL/min — ABNORMAL LOW (ref >60)
Glucose, Bld: 80 mg/dL (ref 70–99)
Potassium: 3.7 mmol/L (ref 3.5–5.1)
Sodium: 139 mmol/L (ref 135–145)

## 2020-05-08 LAB — FUNGITELL, SERUM: Fungitell Result: 79 pg/mL (ref <80)

## 2020-05-08 MED ORDER — METOPROLOL TARTRATE 5 MG/5ML IV SOLN
5.0000 mg | Freq: Once | INTRAVENOUS | Status: AC
  Administered 2020-05-08: 5 mg via INTRAVENOUS

## 2020-05-08 MED ORDER — METOPROLOL TARTRATE 5 MG/5ML IV SOLN
INTRAVENOUS | Status: AC
  Filled 2020-05-08: qty 5

## 2020-05-08 NOTE — Progress Notes (Signed)
ID  Pt has been refusing meds  Has not been eating  Patient Vitals for the past 24 hrs:  BP Temp Temp src Pulse Resp SpO2  05/08/20 0940 (!) 109/53 100 F (37.8 C) Oral (!) 118 19 90 %  05/07/20 2135 119/78 98.5 F (36.9 C) Oral (!) 112 (!) 24 99 %  05/07/20 1733 (!) 106/53 98.6 F (37 C) Oral (!) 118 19 93 %  05/07/20 1706 -- -- -- -- (!) 24 --  05/07/20 1700 -- -- -- (!) 117 (!) 24 --  05/07/20 1645 105/65 -- -- (!) 121 (!) 25 --  05/07/20 1630 -- -- -- (!) 111 -- --  05/07/20 1615 -- -- -- (!) 113 -- --  05/07/20 1600 -- -- -- (!) (P) 112 -- --  05/07/20 1545 -- -- -- (!) (P) 118 (!) (P) 25 --  05/07/20 1530 -- -- -- (!) (P) 123 (!) (P) 23 --  05/07/20 1515 -- -- -- (!) (P) 120 -- --  05/07/20 1500 -- -- -- (!) (P) 116 -- --  05/07/20 1445 (P) 117/64 -- -- (!) (P) 103 -- --  05/07/20 1431 (P) 121/63 -- -- (!) (P) 115 -- --  05/07/20 1430 (P) 118/62 (P) 97.6 F (36.4 C) (P) Oral (!) (P) 105 -- --  05/07/20 1155 (!) 102/54 98.5 F (36.9 C) Oral (!) 110 20 98 %  o/e awake, but lethargic Speech better than yesterday but still not comprehensible Myoclonic jerks arms Chest b/l air entry Decreased bases Hs s1s2 Abd soft CNS follows commands- moves all limbs Rt shoulder painfully restricted abduction   LAbs CBC Latest Ref Rng & Units 05/07/2020 05/06/2020 05/05/2020  WBC 4.0 - 10.5 K/uL 3.4(L) 5.4 7.2  Hemoglobin 12.0 - 15.0 g/dL 7.2(L) 7.8(L) 6.8(L)  Hematocrit 36 - 46 % 21.8(L) 23.3(L) 20.6(L)  Platelets 150 - 400 K/uL 302 274 216    CMP Latest Ref Rng & Units 05/07/2020 05/06/2020 05/05/2020  Glucose 70 - 99 mg/dL 86 101(H) 103(H)  BUN 6 - 20 mg/dL 60(H) 72(H) 66(H)  Creatinine 0.44 - 1.00 mg/dL 3.81(H) 4.96(H) 4.66(H)  Sodium 135 - 145 mmol/L 140 139 139  Potassium 3.5 - 5.1 mmol/L 3.6 4.1 3.9  Chloride 98 - 111 mmol/L 99 99 101  CO2 22 - 32 mmol/L 28 27 26   Calcium 8.9 - 10.3 mg/dL 7.5(L) 7.7(L) 7.5(L)  Total Protein 6.5 - 8.1 g/dL - - -  Total Bilirubin 0.3 - 1.2 mg/dL  - - -  Alkaline Phos 38 - 126 U/L - - -  AST 15 - 41 U/L - - -  ALT 0 - 44 U/L - - -   Impression/recommendation Septic shocksecondary to perforated diverticulitisand peridiverticular abscess. pseudomonas, kleb andESBLe.coli in the culture. JP drain inserted on 04/14/20.  Onmeropenemsince 04/15/20>>day 21 of meropenem- stopped  05/06/20 anidulafungin 8/17>>8/24and restarted on 9/3/21for fever and leucocytosis- both have now resolved  Encephalopathy-metabolic -fluctuating-   Hypoxic resp failure on presentation-needed intubation for a few days s/p extubation  Rigorous  incentive spirometry Chest PT because of secretions and atelectasis     Transaminitis due to shock liverresolved  AKI due to septic shock-getting dialysis  Anasarca Rt arm swelling- DVT was ruled out on 04/22/20- may have to repeat imaging   HIV- well controlled in jan 2021 her Vl <20 and cd4 >1000.  WasOn Genvoya. This has 4 different meds and as we need to adjust the dose because of AKI. Chose a Non NRTI regimen to  dolutegravir+rilpavirine on 8/ instead Dolutegravir + prezcobix ( darunavir+ cobi)VS  Because of encephalopathy and worsening clinical pictureDolutegravir and rilpivarine on hold since 05/02/20. But I dont think that is the reason for her encephalopathy- so will restart CD4 count is 265 with 33% on 8/16/2021and Vl <20 On 05/03/20 Vl is < 20 Cd4 301 ( 50%) restrat dolutegravir and rilpivirine    Afib on amiodarone   Transaminitis due to shock liverresolved  Thrombocytopenia-resolved  Poor appetite, not eating Discussed the management with the nutritionist

## 2020-05-08 NOTE — Consult Note (Signed)
[place] Psychiatry Consult   Reason for Consult:   End stage illness, depression, passive SI and statements    Referring Physician:   IM team  Patient Identification: Melanie Bowen MRN:  [id number] Principal Diagnosis: Severe sepsis with septic shock [facility])    Diagnosis:  Principal Problem:   Severe sepsis with septic shock ([place]) Active Problems:   HTN (hypertension)   HIV (human immunodeficiency virus infection) ([place])   Diverticulitis of large intestine with abscess without bleeding   Abscess of sigmoid colon due to diverticulitis   Acute renal failure (ARF) ([place])   Hypotension   Diverticulosis   Acute respiratory distress   DNR (do not resuscitate) discussion   Palliative care by specialist   Dyspnea   Goals of care, counseling/discussion   Pressure injury of skin  Depression due to Chronic medical illness   Total Time spent with patient: 30-40   Subjective:   Melanie Bowen is a 59 y.o. female patient admitted with multiple issues including septic shock, renai failure and multiple related issues   .  HPI:  She had voiced passive Si and there was concern about increasing depression and anxiety due to relenting medical issues.  Palliative care consult indicates great support as she decides her issues, and her daughter and sister are feeling she needs skilled nursing facility.    History limited due to her weakness and cooperation  She feels she has some sadness, depression ongoing at times low energy, motivation concentration and enthusiasm lack of sleep at times  No frank psychosis or mania.  Her issues are now mainly situational due to chronic illness and its sequelae  She does not want to take psych meds she says  For depression or anxiety per se.   She has no psychosis or manic symptoms does not seem to be in delirious state but has difficulty with focus and attention due to constant issues with medical interventions  Last pm was reportedly angry with  staff for many interruptions --but she felt that she needed to rest      Past Psychiatric History:  Daughter and Mom ---say she has not had a history but may have had past ETOH dependence not active now  They want to send her to SNF soon.        Risk to Self:  not actively    Risk to Others:  none  Prior Inpatient Therapy:  none  Prior Outpatient Therapy:  none   Past Medical History:  Past Medical History:  Diagnosis Date  . Acute GI hemorrhage 12/2015   required transfusion  . Allergic rhinitis   . Arthritis    DDD  . Asthma   . Blepharitis   . Bronchitis   . Cigarette smoker   . Claustrophobia   . COPD (chronic obstructive pulmonary disease) ([place])   . Cough   . DDD (degenerative disc disease), lumbar 04/10/2014  . Depression   . Diverticulosis   . Ectopic pregnancy   . Eye irritation   . Genital herpes   . GERD (gastroesophageal reflux disease)   . Headache   . HIV (human immunodeficiency virus infection) ([place])   . Hypertension   . Paresthesia of hand, bilateral   . Shortness of breath dyspnea    at times due to asthma    Past Surgical History:  Procedure Laterality Date  . BACK SURGERY  12/2014   lumbar lam. Dr. Deri Fuelling  . BACK SURGERY  12/25/2012   Removal lumbosubarachnoid  shunt system, L5-S1 TF ESI, Dr. Virl Axe Minchew  . back surgery may 2016     lumbar   . BUNIONECTOMY Bilateral    feet  . COLONOSCOPY  12/06/2014   rescheduled from 11/01/2014 due to poor prep  . DIAGNOSTIC LAPAROSCOPY    . DIALYSIS/PERMA CATHETER INSERTION N/A 04/25/2020   Procedure: DIALYSIS/PERMA CATHETER INSERTION;  Surgeon: Algernon Huxley, MD;  Location: [place] [facility];  Service: Cardiovascular;  Laterality: N/A;  . DISTAL FEMUR OSTEOTOMY Right    Dr. Earnestine Leys  . ESOPHAGOGASTRODUODENOSCOPY Left 01/11/2016   Procedure: ESOPHAGOGASTRODUODENOSCOPY (EGD);  Surgeon: Hulen Luster, MD;  Location[facility] ENDOSCOPY;  Service: Endoscopy;  Laterality: Left;  . Finger  fracture Right    5th finger  . FRACTURE SURGERY Right 01/25/2014   closed reduction & percutaneous pinning of 5th digit  . HAND SURGERY Bilateral    carpal tunnel  . JOINT REPLACEMENT Bilateral    knees  . KNEE SURGERY Bilateral 2003,2007   total Joint  . LAPAROSCOPY FOR ECTOPIC PREGNANCY    . PERIPHERAL VASCULAR CATHETERIZATION N/A 01/12/2016   Procedure: Visceral Angiography;  Surgeon: Algernon Huxley, MD;  Location: [place] [facility];  Service: Cardiovascular;  Laterality: N/A;  . PERIPHERAL VASCULAR CATHETERIZATION N/A 01/12/2016   Procedure: Visceral Artery Intervention;  Surgeon: Algernon Huxley, MD;  Location: [place] [facility] LAB;  Service: Cardiovascular;  Laterality: N/A;  . REVISION TOTAL KNEE ARTHROPLASTY Right 12/31/2002   Dr. Marry Guan  . TUBAL LIGATION    . VIDEO BRONCHOSCOPY N/A 06/30/2015   Procedure: VIDEO BRONCHOSCOPY WITHOUT FLUORO;  Surgeon: Vilinda Boehringer, MD;  Location: [facility] ORS;  Service: Cardiopulmonary;  Laterality: N/A;   Family History:  Family History  Problem Relation Age of Onset  . Breast cancer Sister    Family Psychiatric  History:  Daughter says there is none   Social history --retired now on [place] or legal issues none   Substance --drug and ETOH  None active  Educational --HS   She has two daughters--and sisters and brothers all aware of her situation  They say she wishes to be at home -- For care, but understands the need for SNF - patient feels she wants to continue dialysis at this time.   She is working with palliative care also in supportive manner to further her thoughts on how she wants to proceed with care.   Daughter and sisters are supportive of her choices for now.           Social History:  Social History   Substance and Sexual Activity  Alcohol Use Yes  . Alcohol/week: 5.0 standard drinks  . Types: 5 Glasses of wine per week   Comment: occ     Social History   Substance and Sexual Activity  Drug Use  Not Currently    Social History   Socioeconomic History  . Marital status: Single    Spouse name: Not on file  . Number of children: Not on file  . Years of education: Not on file  . Highest education level: Not on file  Occupational History  . Not on file  Tobacco Use  . Smoking status: Current Some Day Smoker    Packs/day: 0.25    Years: 38.00    Pack years: 9.50    Types: Cigarettes  . Smokeless tobacco: Never Used  . Tobacco comment: 1 cig daily  Vaping Use  . Vaping Use: Never used  Substance and Sexual  Activity  . Alcohol use: Yes    Alcohol/week: 5.0 standard drinks    Types: 5 Glasses of wine per week    Comment: occ  . Drug use: Not Currently  . Sexual activity: Not on file  Other Topics Concern  . Not on file  Social History Narrative  . Not on file   Social Determinants of Health   Financial Resource Strain:   . Difficulty of Paying Living Expenses: Not on file  Food Insecurity:   . Worried About [name] fundraiser in the Last Year: Not on file  . Ran Out of Food in the Last Year: Not on file  Transportation Needs:   . Lack of Transportation (Medical): Not on file  . Lack of Transportation (Non-Medical): Not on file  Physical Activity:   . Days of Exercise per Week: Not on file  . Minutes of Exercise per Session: Not on file  Stress:   . Feeling of Stress : Not on file  Social Connections:   . Frequency of Communication with Friends and Family: Not on file  . Frequency of Social Gatherings with Friends and Family: Not on file  . Attends Religious Services: Not on file  . Active Member of Clubs or Organizations: Not on file  . Attends Archivist Meetings: Not on file  . Marital Status: Not on file   Additional Social History:--she ultimately seeks care at home     Allergies:   Allergies  Allergen Reactions  . Acetaminophen Nausea And Vomiting  . Ibuprofen Other (See Comments)    Reports causes bleeding  . Gabapentin Rash  .  Morphine Itching and Rash  . Morphine And Related Itching  . Zanaflex  [Tizanidine] Rash    Labs:  Results for orders placed or performed during the hospital encounter of 04/14/20 (from the past 48 hour(s))  Magnesium     Status: None   Collection Time: 05/07/20  3:31 PM  Result Value Ref Range   Magnesium 2.2 1.7 - 2.4 mg/dL    Comment: Performed at [facility], [address] [place], [place] [address]  CBC     Status: Abnormal   Collection Time: 05/07/20  3:47 PM  Result Value Ref Range   WBC 3.4 (L) 4.0 - 10.5 K/uL   RBC 2.36 (L) 3.87 - 5.11 MIL/uL   Hemoglobin 7.2 (L) 12.0 - 15.0 g/dL   HCT 21.8 (L) 36 - 46 %   MCV 92.4 80.0 - 100.0 fL   MCH 30.5 26.0 - 34.0 pg   MCHC 33.0 30.0 - 36.0 g/dL   RDW 16.9 (H) 11.5 - 15.5 %   Platelets 302 150 - 400 K/uL   nRBC 1.5 (H) 0.0 - 0.2 %    Comment: Performed at [facility], [address] [place], [place] [address]  Renal function panel     Status: Abnormal   Collection Time: 05/07/20  3:47 PM  Result Value Ref Range   Sodium 140 135 - 145 mmol/L   Potassium 3.6 3.5 - 5.1 mmol/L   Chloride 99 98 - 111 mmol/L   CO2 28 22 - 32 mmol/L   Glucose, Bld 86 70 - 99 mg/dL    Comment: Glucose reference range applies only to samples taken after fasting for at least 8 hours.   BUN 60 (H) 6 - 20 mg/dL   Creatinine, Ser 3.81 (H) 0.44 - 1.00 mg/dL   Calcium 7.5 (L) 8.9 - 10.3 mg/dL  Phosphorus 5.1 (H) 2.5 - 4.6 mg/dL   Albumin 1.2 (L) 3.5 - 5.0 g/dL   GFR calc non Af Amer 12 (L) >60 mL/min   GFR calc Af Amer 14 (L) >60 mL/min   Anion gap 13 5 - 15    Comment: Performed at [facility], [address] [place], [place] [address]  CBC     Status: Abnormal   Collection Time: 05/08/20 11:37 AM  Result Value Ref Range   WBC 4.1 4.0 - 10.5 K/uL   RBC 2.60 (L) 3.87 - 5.11 MIL/uL   Hemoglobin 8.0 (L) 12.0 - 15.0 g/dL   HCT 24.2 (L) 36 - 46 %   MCV 93.1 80.0 - 100.0 fL   MCH 30.8 26.0 - 34.0 pg   MCHC 33.1  30.0 - 36.0 g/dL   RDW 17.0 (H) 11.5 - 15.5 %   Platelets 341 150 - 400 K/uL   nRBC 1.5 (H) 0.0 - 0.2 %    Comment: Performed at [facility], [address], [place], [place] [address]  Basic metabolic panel     Status: Abnormal   Collection Time: 05/08/20 11:37 AM  Result Value Ref Range   Sodium 139 135 - 145 mmol/L   Potassium 3.7 3.5 - 5.1 mmol/L   Chloride 98 98 - 111 mmol/L   CO2 27 22 - 32 mmol/L   Glucose, Bld 80 70 - 99 mg/dL    Comment: Glucose reference range applies only to samples taken after fasting for at least 8 hours.   BUN 54 (H) 6 - 20 mg/dL   Creatinine, Ser 3.87 (H) 0.44 - 1.00 mg/dL   Calcium 7.3 (L) 8.9 - 10.3 mg/dL   GFR calc non Af Amer 12 (L) >60 mL/min   GFR calc Af Amer 14 (L) >60 mL/min   Anion gap 14 5 - 15    Comment: Performed at [facility], [address], [place], [place] [address]  Magnesium     Status: None   Collection Time: 05/08/20 11:37 AM  Result Value Ref Range   Magnesium 1.8 1.7 - 2.4 mg/dL    Comment: Performed at [facility], [address] [place],  [address]    Current Facility-Administered Medications  Medication Dose Route Frequency Provider Last Rate Last Admin  . (feeding supplement) PROSource Plus liquid 30 mL  30 mL Oral TID BM Jennye Boroughs, MD   30 mL at 05/07/20 1802  . 0.9 %  sodium chloride infusion  250 mL Intravenous Continuous Algernon Huxley, MD 10 mL/hr at 05/03/20 0043 250 mL at 05/03/20 0043  . acetaminophen (TYLENOL) tablet 650 mg  650 mg Oral Q6H PRN Algernon Huxley, MD   650 mg at 05/05/20 1049   Or  . acetaminophen (TYLENOL) suppository 650 mg  650 mg Rectal Q6H PRN Algernon Huxley, MD      . acidophilus (RISAQUAD) capsule 1 capsule  1 capsule Oral Daily Algernon Huxley, MD   1 capsule at 05/07/20 1803  . albuterol (PROVENTIL) (2.5 MG/3ML) 0.083% nebulizer solution 2.5 mg  2.5 mg Nebulization Q4H PRN Algernon Huxley, MD      . ALPRAZolam Duanne Moron) tablet 1 mg  1 mg Oral TID PRN Algernon Huxley, MD   1 mg at 05/02/20 1056  . alteplase (CATHFLO ACTIVASE) injection 2 mg  2 mg Intracatheter Once Algernon Huxley, MD      . alteplase (CATHFLO ACTIVASE) injection 2 mg  2 mg Intracatheter Once Algernon Huxley, MD      . amiodarone (PACERONE) tablet 200 mg  200 mg Oral BID Algernon Huxley, MD   200 mg at 05/06/20 2129  . anidulafungin (ERAXIS) 100 mg in sodium chloride 0.9 % 100 mL IVPB  100 mg Intravenous Q24H Tsosie Billing, MD   Stopped at 05/06/20 2308  . bisacodyl (DULCOLAX) EC tablet 10 mg  10 mg Oral Daily PRN Algernon Huxley, MD   10 mg at 05/03/20 1558  . Chlorhexidine Gluconate Cloth 2 % PADS 6 each  6 each Topical Daily Algernon Huxley, MD   6 each at 05/08/20 1017  . diphenhydrAMINE (BENADRYL) capsule 25 mg  25 mg Oral Q8H PRN Jennye Boroughs, MD      . docusate sodium (COLACE) capsule 200 mg  200 mg Oral BID Algernon Huxley, MD   200 mg at 05/06/20 1011  . rilpivirine (EDURANT) tablet 25 mg  25 mg Oral Q breakfast Tsosie Billing, MD   25 mg at 05/07/20 1804   And  . dolutegravir (TIVICAY) tablet 50 mg  50 mg Oral Q breakfast Tsosie Billing, MD   50 mg at 05/07/20 1804  . epoetin alfa (EPOGEN) injection 10,000 Units  10,000 Units Intravenous Q M,W,F-HD Kolluru, Sarath, MD   10,000 Units at 05/07/20 1400  . famotidine (PEPCID) tablet 10 mg  10 mg Oral Q1200 Lu Duffel, RPH   10 mg at 05/07/20 1803  . feeding supplement (NEPRO CARB STEADY) liquid 237 mL  237 mL Oral TID BM Jennye Boroughs, MD   237 mL at 05/07/20 1803  . guaiFENesin-dextromethorphan (ROBITUSSIN DM) 100-10 MG/5ML syrup 10 mL  10 mL Oral Q8H Algernon Huxley, MD   10 mL at 05/07/20 1803  . heparin injection 5,000 Units  5,000 Units Subcutaneous Q8H Algernon Huxley, MD   5,000 Units at 05/07/20 1806  . heparin SCN flush for CVL/PCVC (NS + heparin 1 unit/ml)  0.5-1.7 mL Intravenous PRN Algernon Huxley, MD   1.4 mL at 04/16/20 1717  . HYDROmorphone (DILAUDID) injection 0.5-1 mg  0.5-1 mg Intravenous Q2H PRN Algernon Huxley, MD   1 mg at 05/06/20 1027  . iohexol (OMNIPAQUE) 9 MG/ML oral solution 1,000 mL  1,000 mL Oral Once PRN Pabon, Diego F, MD      . ipratropium-albuterol (DUONEB) 0.5-2.5 (3) MG/3ML nebulizer solution 3 mL  3 mL Nebulization Q6H PRN Algernon Huxley, MD   3 mL at 05/05/20 0911  . labetalol (NORMODYNE) injection 20 mg  20 mg Intravenous Q3H PRN Algernon Huxley, MD   20 mg at 05/05/20 1759  . magic mouthwash  5 mL Oral QID PRN Algernon Huxley, MD       And  . lidocaine (XYLOCAINE) 2 % viscous mouth solution 15 mL  15 mL Mouth/Throat QID PRN Algernon Huxley, MD   15 mL at 05/03/20 1059  . magnesium hydroxide (MILK OF MAGNESIA) suspension 30 mL  30 mL Oral Daily PRN Algernon Huxley, MD   30 mL at 05/03/20 1558  . magnesium oxide (MAG-OX) tablet 400 mg  400 mg Oral Daily Jennye Boroughs, MD   400 mg at 05/07/20 1805  . menthol-cetylpyridinium (CEPACOL) lozenge 3 mg  1 lozenge Oral PRN Algernon Huxley, MD   3 mg at 04/24/20 1059  . mometasone-formoterol (DULERA) 200-5 MCG/ACT inhaler 2 puff  2 puff Inhalation BID Algernon Huxley, MD   2  puff at 05/07/20 1805  . multivitamin (RENA-VIT) tablet 1 tablet  1 tablet Oral QHS Algernon Huxley, MD   1 tablet at 05/06/20 2130  . oxyCODONE-acetaminophen (PERCOCET) 7.5-325 MG per tablet 1 tablet  1 tablet Oral Q6H PRN Wyvonnia Dusky, MD   1 tablet at 05/04/20 0106  . phenol (CHLORASEPTIC) mouth spray 1 spray  1 spray Mouth/Throat PRN Algernon Huxley, MD   1 spray at 04/24/20 1543  . simethicone (MYLICON) chewable tablet 80 mg  80 mg Oral QID PRN Algernon Huxley, MD   80 mg at 04/29/20 0918  . sodium chloride flush (NS) 0.9 % injection 5 mL  5 mL Intracatheter Q8H Algernon Huxley, MD   5 mL at 05/07/20 2152  . torsemide (DEMADEX) tablet 40 mg  40 mg Oral Daily Murlean Iba, MD   40 mg at 05/07/20 1804    Musculoskeletal: Strength & Muscle Tone: weakness and disability  Gait & Station: limited needs PT  Patient leans: na  Psychiatric Specialty Exam: Physical Exam  Review of  Systems  Blood pressure (!) 109/53, pulse (!) 118, temperature 100 F (37.8 C), temperature source Oral, resp. rate 19, height 5\' 6"  (1.676 m), weight 130.9 kg, SpO2 90 %.Body mass index is 46.57 kg/m.  Mental Status  AA female at rest / partially cooperative she is tired she feels Oriented times four Consciousness not clouded or fluctuant Concentration and attention diminished  Mood depressed affect flat Somewhat anxious  Thought process and content --preoccupied with anxiety illness issues and phase of life issues  Memory cannot assess she is not cooperative  Concentration and attention ---fair to poor  Not actively suicidal or homicidal Judgement insight and reliability --- Okay   But waxes and wanes Abstraction ---fair  Intelligence and fund of knowledge normal                                                                 Language ---English / articulation okay  Aims not done Akathisia none Handedness not known Assets ---will to keep going on, supportive family  ADL's ---needs assistance due to chronic illness Cognition ---intact but has not rested well here Sleep on and off  Psychomotor activity --nothing acute    Treatment Plan Summary:  Patient is not imminently suicidal  She contracts --for safety  She wants to continue her routine without psych meds      Disposition:  Pending ---to SNF and all.   Supportive statements made in general     Eulas Post, MD 05/08/2020 4:38 PM

## 2020-05-08 NOTE — Progress Notes (Signed)
Pt refusing all care at this time. NP made aware of pt refusing to take medication during the night. Pt has refused to keep on heart monitor &  O2. Pt has refused labs and has repeatedly thrown purewick on the floor. NP came to the bedside and this RN was advised to leave the patient alone for now. Telemetry on standby.

## 2020-05-08 NOTE — Progress Notes (Signed)
Palliative: [name], Melanie Bowen, is lying quietly in bed.  She will briefly make but not keep eye contact.  She appears acutely/chronically ill, morbidly obese, weak and frail. I share that nursing staff reported that she would "rather die than take more medicines". We talked about the acuity of her illness, are worried that she is not progressing well. I shared that she decides how we take care of her, what matters to her. I share that if she does not want to continue with aggressive treatment that is her right, all she has to do is let us know. At this point [facility] nods affirmatively, but has no statement about the direction of her care. I encouraged her to think about what this time looks like and feels like for herself, what matters to her. I share that palliative team will continue to follow-up with her.  Conference with attending, bedside nursing staff, transition of care team related to patient condition, needs, goals of care.  Plan:    At this point continue to treat the treatable but no BiPAP or intubation. [name] is considering direction of care for her future.  25 minutes  Quinn Axe, NP Palliative medicine team Team phone [phone] Greater than 50% of this time was spent counseling and coordinating care related to the above assessment and plan.

## 2020-05-08 NOTE — Progress Notes (Signed)
Nutrition Follow-up  DOCUMENTATION CODES:   Morbid obesity  INTERVENTION:   Nepro Shake po TID, each supplement provides 425 kcal and 19 grams protein  Continue Rena-vit daily  Dysphagia 3 diet   Assist with meals   NUTRITION DIAGNOSIS:   Increased nutrient needs related to chronic illness (COPD, new HD) as evidenced by estimated needs. -ongoing  GOAL:   Patient will meet greater than or equal to 90% of their needs -not met   MONITOR:   PO intake, Supplement acceptance, Labs, Weight trends, Skin, I & O's  ASSESSMENT:  RD working remotely.  59 y/o female with h/o HIV, COPD and HTN who is admitted with septic shock secondary to perforated diverticulitis and peridiverticular abscess s/p JP drain on 04/14/20 and AKI requiring temporary HD  8/18- first HD treatment  8/27 - RIJ permacath placed  Met with pt in room today. Pt with some confusion and is unable to provide any nutrition information. Pt reports that she is hungry and wants to eat but when offered food, shakes her head no and reports that she does not want to eat right now. RD offered patient Nepro and sweetened tea at bedside. Pt drank only a few sips and then begins coughing. Pt is unable to hold her cup by herself without spilling it. Pt's lunch tray was set up in front of her and pt was able to take one bite of her food on her own and then set down her fork. Pt was reluctant  to cut up her food or prepare her meal tray on her own. RD will change pt to a mechanical soft diet and add orders for meal assistance. May need to consider nasogastric tube placement and nutrition support if pt's oral intake does not improve with diet liberalization. Pt is at high refeed risk. Palliative care following. Psychiatry consult pending.   Per chart, pt is up ~20lbs since admit  Medications reviewed and include: risaquad, colace, epoetin, pepcid, heparin, Mg oxide, rena-vite, torsemide  Labs reviewed: BUN 54(H), creat 3.87(H), Mg  1.8 wnl P 5.1(H)- 9/8 Hgb 8.0(L), Hct 24.2(L)  Nutrition-Focused physical exam completed. Findings are no fat depletion, no muscle depletion and moderate edema.   Diet Order:   Diet Order            Diet renal with fluid restriction Room service appropriate? Yes; Fluid consistency: Thin  Diet effective now                EDUCATION NEEDS:   No education needs have been identified at this time  Skin:  Skin Assessment: Reviewed RN Assessment (ecchymosis, MASD, incision abdomen)  Last BM:  9/9- TYPE 6  Height:   Ht Readings from Last 1 Encounters:  04/22/20 $RemoveB'5\' 6"'[id number]$  (1.676 m)    Weight:   Wt Readings from Last 1 Encounters:  05/03/20 130.9 kg    Ideal Body Weight:  59 kg  BMI:  Body mass index is 46.57 kg/m.  Estimated Nutritional Needs:   Kcal:  2300-2600kcal/day  Protein:  >120g/day  Fluid:  UOP +1L  Koleen Distance MS, RD, LDN Please refer to [facility] for RD and/or RD on-call/weekend/after hours pager

## 2020-05-08 NOTE — Progress Notes (Signed)
PROGRESS NOTE    AVILENE MARRIN  YFV:[id number] DOB: 1961/01/06 DOA: 04/14/2020 PCP: Theotis Burrow, MD  Assessment & Plan:   Principal Problem:   Severe sepsis with septic shock ([place]) Active Problems:   HTN (hypertension)   HIV (human immunodeficiency virus infection) ([employer])   Diverticulitis of large intestine with abscess without bleeding   Abscess of sigmoid colon due to diverticulitis   Acute renal failure (ARF) ([place])   Hypotension   Diverticulosis   Acute respiratory distress   DNR (do not resuscitate) discussion   Palliative care by specialist   Dyspnea   Goals of care, counseling/discussion   Pressure injury of skin   Septic shock:  resolved  Acute sigmoid diverticulitis and diverticular abscess: s/p CT-guided drainage by IR on 04/14/2020.  Fluid culture showed Klebsiella pneumonia, pseudomonas aeruginosa and E. Coli. Completed IV merrem course. Continue on IV anidulafungin as per ID  AKI: continue on HD as per nephro. Has an outpatient HD spot set up  A. Fib: continue on amiodarone. Will consider long-term anticoagulation in the future  Acute normocytic anemia: s/p 2 units of pRBCs. H&H are trending up today.  Will continue to monitor   COPD exacerbation: resolved. Continue on bronchodilators & incentive spirometry   HIV infection: continue on rilpivirine, dolutegravir as per ID   Acute liver failure: resolved  Leukocytosis: resolved  Acute hypoxemic respiratory failure: weaned off of supplemental oxygen today. Will continue to monitor   Acute metabolic encephalopathy/delirium: recurrent.  No acute abnormality on CT head. Re-orient prn   History of stroke: not on statin, aspirin or plavix at home.   DVT prophylaxis: heparin  Code Status: full  Family Communication: discussed pt's care w/ pt's daughter, [name], and answered her questions  Disposition Plan:  Likely d/c to SNF  Status is: Inpatient  Remains inpatient appropriate  because:IV treatments appropriate due to intensity of illness or inability to take PO   Dispo: The patient is from: Home              Anticipated d/c is to: SNF              Anticipated d/c date is: > 3 days              Patient currently is not medically stable to d/c.     Consultants:   nephro   gen surg   Vascular surg    Procedures:   Antimicrobials: completed course   Subjective: Pt is agitated today and refusing to take her medications again.   Objective: Vitals:   05/07/20 1706 05/07/20 1733 05/07/20 2135 05/08/20 0940  BP:  (!) 106/53 119/78 (!) 109/53  Pulse:  (!) 118 (!) 112 (!) 118  Resp: (!) 24 19 (!) 24 19  Temp:  98.6 F (37 C) 98.5 F (36.9 C) 100 F (37.8 C)  TempSrc:  Oral Oral Oral  SpO2:  93% 99% 90%  Weight:      Height:        Intake/Output Summary (Last 24 hours) at 05/08/2020 1347 Last data filed at 05/07/2020 1700 Gross per 24 hour  Intake --  Output 0 ml  Net 0 ml   Filed Weights   04/29/20 0500 05/02/20 0358 05/03/20 0547  Weight: 134.4 kg 135.2 kg 130.9 kg    Examination:  General exam: Appears agitated  Respiratory system: diminished breath sounds b/l. No rales Cardiovascular system: S1 & S2 +. No rubs, gallops or clicks.  Gastrointestinal system: Abdomen is  obese, soft and nontender. Normal bowel sounds Central nervous system: Awake. Moves all 4 extremities  Psychiatry: Judgement and insight appear abnormal. Appears agitated and frustrated     Data Reviewed: I have personally reviewed following labs and imaging studies  CBC: Recent Labs  Lab 05/02/20 0945 05/02/20 0945 05/03/20 0636 05/04/20 0543 05/05/20 0831 05/06/20 0701 05/07/20 1547 05/08/20 1137  WBC 23.1*   < > 18.4* 11.0* 7.2 5.4 3.4* 4.1  NEUTROABS 21.6*  --  16.9* 9.4* 5.9 4.1  --   --   HGB 7.2*   < > 7.8* 6.7* 6.8* 7.8* 7.2* 8.0*  HCT 21.4*   < > 23.6* 19.5* 20.6* 23.3* 21.8* 24.2*  MCV 90.7   < > 93 89.9 91.6 89.6 92.4 93.1  PLT 152   < > 131*  162 216 274 302 341   < > = values in this interval not displayed.   Basic Metabolic Panel: Recent Labs  Lab 05/02/20 0945 05/03/20 0636 05/04/20 0543 05/05/20 0831 05/06/20 0701 05/07/20 1531 05/07/20 1547 05/08/20 1137  NA 134*  --  136 139 139  --  140 139  K 4.2  --  3.7 3.9 4.1  --  3.6 3.7  CL 97*  --  98 101 99  --  99 98  CO2 24  --  25 26 27   --  28 27  GLUCOSE 76  --  93 103* 101*  --  86 80  BUN 67*  --  52* 66* 72*  --  60* 54*  CREATININE 4.71*  --  4.15* 4.66* 4.96*  --  3.81* 3.87*  CALCIUM 7.8*  --  7.6* 7.5* 7.7*  --  7.5* 7.3*  MG 1.8   < > 1.8 1.9 2.1 2.2  --  1.8  PHOS 7.0*  --  5.8* 6.4* 7.5*  --  5.1*  --    < > = values in this interval not displayed.   GFR: Estimated Creatinine Clearance: 22 mL/min (A) (by C-G formula based on SCr of 3.87 mg/dL (H)). Liver Function Tests: Recent Labs  Lab 05/03/20 0636 05/04/20 0543 05/05/20 0831 05/06/20 0701 05/07/20 1547  AST 27  --   --   --   --   ALT 18  --   --   --   --   ALKPHOS 101  --   --   --   --   BILITOT 1.4*  --   --   --   --   PROT 5.8*  --   --   --   --   ALBUMIN 1.5* 1.3* 1.3* 1.3* 1.2*   No results for input(s): LIPASE, AMYLASE in the last 168 hours. No results for input(s): AMMONIA in the last 168 hours. Coagulation Profile: No results for input(s): INR, PROTIME in the last 168 hours. Cardiac Enzymes: No results for input(s): CKTOTAL, CKMB, CKMBINDEX, TROPONINI in the last 168 hours. BNP (last 3 results) No results for input(s): PROBNP in the last 8760 hours. HbA1C: No results for input(s): HGBA1C in the last 72 hours. CBG: Recent Labs  Lab 05/03/20 0844  GLUCAP 78   Lipid Profile: No results for input(s): CHOL, HDL, LDLCALC, TRIG, CHOLHDL, LDLDIRECT in the last 72 hours. Thyroid Function Tests: No results for input(s): TSH, T4TOTAL, FREET4, T3FREE, THYROIDAB in the last 72 hours. Anemia Panel: No results for input(s): VITAMINB12, FOLATE, FERRITIN, TIBC, IRON, RETICCTPCT  in the last 72 hours. Sepsis Labs: No results for input(s): PROCALCITON,  LATICACIDVEN in the last 168 hours.  Recent Results (from the past 240 hour(s))  CULTURE, BLOOD (ROUTINE X 2) w Reflex to ID Panel     Status: None   Collection Time: 05/01/20  2:50 PM   Specimen: BLOOD  Result Value Ref Range Status   Specimen Description BLOOD BLOOD LEFT HAND  Final   Special Requests   Final    BOTTLES DRAWN AEROBIC AND ANAEROBIC Blood Culture adequate volume   Culture   Final    NO GROWTH 5 DAYS Performed at [facility], [address] [place], [place] [address]    Report Status 05/06/2020 FINAL  Final  CULTURE, BLOOD (ROUTINE X 2) w Reflex to ID Panel     Status: None   Collection Time: 05/01/20  2:51 PM   Specimen: BLOOD  Result Value Ref Range Status   Specimen Description BLOOD BLOOD RIGHT HAND  Final   Special Requests   Final    BOTTLES DRAWN AEROBIC AND ANAEROBIC Blood Culture adequate volume   Culture   Final    NO GROWTH 5 DAYS Performed at [facility], [address] [place], [place] [address]    Report Status 05/06/2020 FINAL  Final         Radiology Studies: No results found.      Scheduled Meds: . (feeding supplement) PROSource Plus  30 mL Oral TID BM  . acidophilus  1 capsule Oral Daily  . alteplase  2 mg Intracatheter Once  . alteplase  2 mg Intracatheter Once  . amiodarone  200 mg Oral BID  . Chlorhexidine Gluconate Cloth  6 each Topical Daily  . docusate sodium  200 mg Oral BID  . rilpivirine  25 mg Oral Q breakfast   And  . dolutegravir  50 mg Oral Q breakfast  . epoetin (EPOGEN/PROCRIT) injection  10,000 Units Intravenous Q M,W,F-HD  . famotidine  10 mg Oral Q1200  . feeding supplement (NEPRO CARB STEADY)  237 mL Oral TID BM  . guaiFENesin-dextromethorphan  10 mL Oral Q8H  . heparin injection (subcutaneous)  5,000 Units Subcutaneous Q8H  . magnesium oxide  400 mg Oral Daily  . mometasone-formoterol  2 puff Inhalation BID   . multivitamin  1 tablet Oral QHS  . sodium chloride flush  5 mL Intracatheter Q8H  . torsemide  40 mg Oral Daily   Continuous Infusions: . sodium chloride 250 mL (05/03/20 0043)  . anidulafungin Stopped (05/06/20 2308)     LOS: 24 days    Time spent: 31 mins     Wyvonnia Dusky, MD [facility] Pager [phone]  If 7PM-7AM, please contact night-coverage [email or web address] 05/08/2020, 1:47 PM

## 2020-05-08 NOTE — Progress Notes (Signed)
[facility]  ROUNDING NOTE   Subjective:   Patient more awake and alert today,still confused,requested to talk with her family, telephone handed over to patient after dialing the requested number. As per nursing report,she is upset and uncooperative with care today.     Objective:  Vital signs in last 24 hours:  Temp:  [97.6 F (36.4 C)-100 F (37.8 C)] 100 F (37.8 C) (09/09 0940) Pulse Rate:  [103-123] 118 (09/09 0940) Resp:  [19-25] 19 (09/09 0940) BP: (105-119)/(53-78) 109/53 (09/09 0940) SpO2:  [90 %-99 %] 90 % (09/09 0940)  Weight change:  Filed Weights   04/29/20 0500 05/02/20 0358 05/03/20 0547  Weight: 134.4 kg 135.2 kg 130.9 kg    Intake/Output: I/O last 3 completed shifts: In: 130 [IV Piggyback:130] Out: 65 [Urine:50; Drains:15]   Intake/Output this shift:  No intake/output data recorded.  Physical Exam: General: Awake, alert, in no acute distress  Lungs:  diminished at the bases  Heart: S1S2 Regular  Abdomen:  Obese, nontender  Extremities:  Trace peripheral edema.  Neurologic:  Confused, able to tell her name and the place 'hospital', but unable to specify details  Skin  Left inner thigh and left knee  with blisters  Access: RIJ permcath 8/27 Dr. [name]    Basic Metabolic Panel: Recent Labs  Lab 05/02/20 0945 05/03/20 0636 05/04/20 0543 05/04/20 0543 05/05/20 0831 05/05/20 0831 05/06/20 0701 05/07/20 1531 05/07/20 1547 05/08/20 1137  NA 134*  --  136  --  139  --  139  --  140 139  K 4.2  --  3.7  --  3.9  --  4.1  --  3.6 3.7  CL 97*  --  98  --  101  --  99  --  99 98  CO2 24  --  25  --  26  --  27  --  28 27  GLUCOSE 76  --  93  --  103*  --  101*  --  86 80  BUN 67*  --  52*  --  66*  --  72*  --  60* 54*  CREATININE 4.71*  --  4.15*  --  4.66*  --  4.96*  --  3.81* 3.87*  CALCIUM 7.8*  --  7.6*   < > 7.5*   < > 7.7*  --  7.5* 7.3*  MG 1.8   < > 1.8  --  1.9  --  2.1 2.2  --  1.8  PHOS 7.0*  --  5.8*  --  6.4*  --  7.5*   --  5.1*  --    < > = values in this interval not displayed.    Liver Function Tests: Recent Labs  Lab 05/03/20 0636 05/04/20 0543 05/05/20 0831 05/06/20 0701 05/07/20 1547  AST 27  --   --   --   --   ALT 18  --   --   --   --   ALKPHOS 101  --   --   --   --   BILITOT 1.4*  --   --   --   --   PROT 5.8*  --   --   --   --   ALBUMIN 1.5* 1.3* 1.3* 1.3* 1.2*   No results for input(s): LIPASE, AMYLASE in the last 168 hours. No results for input(s): AMMONIA in the last 168 hours.  CBC: Recent Labs  Lab 05/02/20 0945 05/02/20 0945 05/03/20  2992 05/04/20 0543 05/05/20 0831 05/06/20 0701 05/07/20 1547 05/08/20 1137  WBC 23.1*   < > 18.4* 11.0* 7.2 5.4 3.4* 4.1  NEUTROABS 21.6*  --  16.9* 9.4* 5.9 4.1  --   --   HGB 7.2*   < > 7.8* 6.7* 6.8* 7.8* 7.2* 8.0*  HCT 21.4*   < > 23.6* 19.5* 20.6* 23.3* 21.8* 24.2*  MCV 90.7   < > 93 89.9 91.6 89.6 92.4 93.1  PLT 152   < > 131* 162 216 274 302 341   < > = values in this interval not displayed.    Cardiac Enzymes: No results for input(s): CKTOTAL, CKMB, CKMBINDEX, TROPONINI in the last 168 hours.  BNP: Invalid input(s): POCBNP  CBG: Recent Labs  Lab 05/03/20 0844  GLUCAP 78    Microbiology: Results for orders placed or performed during the hospital encounter of 04/14/20  SARS Coronavirus 2 by RT PCR (hospital order, performed in [facility] hospital lab) Nasopharyngeal Nasopharyngeal Swab     Status: None   Collection Time: 04/14/20  2:17 AM   Specimen: Nasopharyngeal Swab  Result Value Ref Range Status   SARS Coronavirus 2 NEGATIVE NEGATIVE Final    Comment: (NOTE) SARS-CoV-2 target nucleic acids are NOT DETECTED.  The SARS-CoV-2 RNA is generally detectable in upper and lower respiratory specimens during the acute phase of infection. The lowest concentration of SARS-CoV-2 viral copies this assay can detect is 250 copies / mL. A negative result does not preclude SARS-CoV-2 infection and should not be used as the  sole basis for treatment or other patient management decisions.  A negative result may occur with improper specimen collection / handling, submission of specimen other than nasopharyngeal swab, presence of viral mutation(s) within the areas targeted by this assay, and inadequate number of viral copies (<250 copies / mL). A negative result must be combined with clinical observations, patient history, and epidemiological information.  Fact Sheet for Patients:   StrictlyIdeas.no  Fact Sheet for Healthcare Providers: BankingDealers.co.za  This test is not yet approved or  cleared by the [place] FDA and has been authorized for detection and/or diagnosis of SARS-CoV-2 by FDA under an Emergency Use Authorization (EUA).  This EUA will remain in effect (meaning this test can be used) for the duration of the COVID-19 declaration under Section 564(b)(1) of the Act, 21 U.S.C. section 360bbb-3(b)(1), unless the authorization is terminated or revoked sooner.  Performed at [facility], [address] [place], [place] [address]   CULTURE, BLOOD (ROUTINE X 2) w Reflex to ID Panel     Status: None   Collection Time: 04/14/20  8:14 AM   Specimen: BLOOD  Result Value Ref Range Status   Specimen Description BLOOD LEFT [facility]  Final   Special Requests   Final    BOTTLES DRAWN AEROBIC AND ANAEROBIC Blood Culture adequate volume   Culture   Final    NO GROWTH 5 DAYS Performed at [facility], [address] [place],  [address]    Report Status 04/19/2020 FINAL  Final  CULTURE, BLOOD (ROUTINE X 2) w Reflex to ID Panel     Status: None   Collection Time: 04/14/20  8:14 AM   Specimen: BLOOD  Result Value Ref Range Status   Specimen Description BLOOD LEFT WRIST  Final   Special Requests   Final    BOTTLES DRAWN AEROBIC AND ANAEROBIC Blood Culture adequate volume   Culture   Final    NO  GROWTH 5 DAYS Performed at [facility], [address] [place], [place] [address]    Report Status 04/19/2020 FINAL  Final  Aerobic/Anaerobic Culture (surgical/deep wound)     Status: None   Collection Time: 04/14/20 11:14 AM   Specimen: Abscess  Result Value Ref Range Status   Specimen Description   Final    ABSCESS Performed at [facility], [address] [place], [place] [address]    Special Requests   Final    NONE Performed at [facility], [place], [place] [address]    Gram Stain   Final    NO WBC SEEN ABUNDANT GRAM NEGATIVE RODS FEW GRAM POSITIVE COCCI FEW GRAM POSITIVE RODS Performed at [facility], [place] [address], [place], [place] [address]    Culture   Final    ABUNDANT KLEBSIELLA PNEUMONIAE ABUNDANT PSEUDOMONAS AERUGINOSA MODERATE ESCHERICHIA COLI Confirmed Extended Spectrum Beta-Lactamase Producer (ESBL).  In bloodstream infections from ESBL organisms, carbapenems are preferred over piperacillin/tazobactam. They are shown to have a lower risk of mortality. MIXED ANAEROBIC FLORA PRESENT.  CALL LAB IF FURTHER IID REQUIRED.    Report Status 04/20/2020 FINAL  Final   Organism ID, Bacteria KLEBSIELLA PNEUMONIAE  Final   Organism ID, Bacteria PSEUDOMONAS AERUGINOSA  Final   Organism ID, Bacteria ESCHERICHIA COLI  Final      Susceptibility   Escherichia coli - MIC*    AMPICILLIN >=32 RESISTANT Resistant     CEFAZOLIN >=64 RESISTANT Resistant     CEFEPIME 16 RESISTANT Resistant     CEFTAZIDIME RESISTANT Resistant     CEFTRIAXONE >=64 RESISTANT Resistant     CIPROFLOXACIN >=4 RESISTANT Resistant     GENTAMICIN <=1 SENSITIVE Sensitive     IMIPENEM <=0.25 SENSITIVE Sensitive     TRIMETH/SULFA >=320 RESISTANT Resistant     AMPICILLIN/SULBACTAM >=32 RESISTANT Resistant     PIP/TAZO 8 SENSITIVE Sensitive     * MODERATE ESCHERICHIA COLI   Klebsiella pneumoniae - MIC*    AMPICILLIN >=32 RESISTANT Resistant     CEFAZOLIN <=4 SENSITIVE Sensitive      CEFEPIME <=0.12 SENSITIVE Sensitive     CEFTAZIDIME <=1 SENSITIVE Sensitive     CEFTRIAXONE <=0.25 SENSITIVE Sensitive     CIPROFLOXACIN <=0.25 SENSITIVE Sensitive     GENTAMICIN <=1 SENSITIVE Sensitive     IMIPENEM <=0.25 SENSITIVE Sensitive     TRIMETH/SULFA <=20 SENSITIVE Sensitive     AMPICILLIN/SULBACTAM 16 INTERMEDIATE Intermediate     PIP/TAZO 8 SENSITIVE Sensitive     * ABUNDANT KLEBSIELLA PNEUMONIAE   Pseudomonas aeruginosa - MIC*    CEFTAZIDIME 4 SENSITIVE Sensitive     CIPROFLOXACIN <=0.25 SENSITIVE Sensitive     GENTAMICIN 2 SENSITIVE Sensitive     IMIPENEM 2 SENSITIVE Sensitive     PIP/TAZO 8 SENSITIVE Sensitive     CEFEPIME 4 SENSITIVE Sensitive     * ABUNDANT PSEUDOMONAS AERUGINOSA  MRSA PCR Screening     Status: None   Collection Time: 04/14/20  6:19 PM   Specimen: Nasopharyngeal  Result Value Ref Range Status   MRSA by PCR NEGATIVE NEGATIVE Final    Comment:        The GeneXpert MRSA Assay (FDA approved for NASAL specimens only), is one component of a comprehensive MRSA colonization surveillance program. It is not intended to diagnose MRSA infection nor to guide or monitor treatment for MRSA infections. Performed at [facility], [address] [place], [place] [address]  Urine Culture     Status: None   Collection Time: 04/15/20  4:28 AM   Specimen: Urine, Random  Result Value Ref Range Status   Specimen Description   Final    URINE, RANDOM Performed at [facility], [address], [place], [place] [address]    Special Requests   Final    NONE Performed at [facility], [address] [place], [place] [address]    Culture   Final    NO GROWTH Performed at [facility], [place] [address] [place], [place] [address]    Report Status 04/16/2020 FINAL  Final  CULTURE, BLOOD (ROUTINE X 2) w Reflex to ID Panel     Status: None   Collection Time: 04/22/20  6:12 PM   Specimen: BLOOD  Result Value Ref Range Status    Specimen Description BLOOD FOOT  Final   Special Requests   Final    BOTTLES DRAWN AEROBIC AND ANAEROBIC Blood Culture adequate volume   Culture   Final    NO GROWTH 5 DAYS Performed at [facility], [address] [place], [place] [address]    Report Status 04/27/2020 FINAL  Final  CULTURE, BLOOD (ROUTINE X 2) w Reflex to ID Panel     Status: None   Collection Time: 04/22/20  6:17 PM   Specimen: BLOOD  Result Value Ref Range Status   Specimen Description BLOOD FOOT  Final   Special Requests   Final    BOTTLES DRAWN AEROBIC AND ANAEROBIC Blood Culture results may not be optimal due to an excessive volume of blood received in culture bottles   Culture   Final    NO GROWTH 5 DAYS Performed at [facility], [address] [place], [place] [address]    Report Status 04/27/2020 FINAL  Final  CULTURE, BLOOD (ROUTINE X 2) w Reflex to ID Panel     Status: None   Collection Time: 05/01/20  2:50 PM   Specimen: BLOOD  Result Value Ref Range Status   Specimen Description BLOOD BLOOD LEFT HAND  Final   Special Requests   Final    BOTTLES DRAWN AEROBIC AND ANAEROBIC Blood Culture adequate volume   Culture   Final    NO GROWTH 5 DAYS Performed at [facility], [address] [place], [place] [address]    Report Status 05/06/2020 FINAL  Final  CULTURE, BLOOD (ROUTINE X 2) w Reflex to ID Panel     Status: None   Collection Time: 05/01/20  2:51 PM   Specimen: BLOOD  Result Value Ref Range Status   Specimen Description BLOOD BLOOD RIGHT HAND  Final   Special Requests   Final    BOTTLES DRAWN AEROBIC AND ANAEROBIC Blood Culture adequate volume   Culture   Final    NO GROWTH 5 DAYS Performed at [facility], [address] [place], [place] [address]    Report Status 05/06/2020 FINAL  Final    Coagulation Studies: No results for input(s): LABPROT, INR in the last 72 hours.  Urinalysis: No results for input(s): COLORURINE, LABSPEC, PHURINE,  GLUCOSEU, HGBUR, BILIRUBINUR, KETONESUR, PROTEINUR, UROBILINOGEN, NITRITE, LEUKOCYTESUR in the last 72 hours.  Invalid input(s): APPERANCEUR    Imaging: No results found.   Medications:   . sodium chloride 250 mL (05/03/20 0043)  . anidulafungin Stopped (05/06/20 2308)   . (feeding supplement) PROSource Plus  30 mL Oral TID BM  . acidophilus  1 capsule Oral  Daily  . alteplase  2 mg Intracatheter Once  . alteplase  2 mg Intracatheter Once  . amiodarone  200 mg Oral BID  . Chlorhexidine Gluconate Cloth  6 each Topical Daily  . docusate sodium  200 mg Oral BID  . rilpivirine  25 mg Oral Q breakfast   And  . dolutegravir  50 mg Oral Q breakfast  . epoetin (EPOGEN/PROCRIT) injection  10,000 Units Intravenous Q M,W,F-HD  . famotidine  10 mg Oral Q1200  . feeding supplement (NEPRO CARB STEADY)  237 mL Oral TID BM  . guaiFENesin-dextromethorphan  10 mL Oral Q8H  . heparin injection (subcutaneous)  5,000 Units Subcutaneous Q8H  . magnesium oxide  400 mg Oral Daily  . mometasone-formoterol  2 puff Inhalation BID  . multivitamin  1 tablet Oral QHS  . sodium chloride flush  5 mL Intracatheter Q8H  . torsemide  40 mg Oral Daily   acetaminophen **OR** acetaminophen, albuterol, ALPRAZolam, bisacodyl, diphenhydrAMINE, heparin NICU/SCN flush, HYDROmorphone (DILAUDID) injection, iohexol, ipratropium-albuterol, labetalol, magic mouthwash **AND** lidocaine, magnesium hydroxide, menthol-cetylpyridinium, oxyCODONE-acetaminophen, phenol, simethicone  Assessment/ Plan:  Ms. DOROTEA HAND is a 59 y.o. black female with HIV, COPD, hypertension, GERD, who is admitted to [facility] on 04/14/2020 for Dyspnea [R06.00] Colonic diverticular abscess [K57.20] Diverticulitis of large intestine with abscess without bleeding [K57.20] Abscess of sigmoid colon due to diverticulitis [K57.20]  #Acute renal failure   No history of chronic kidney disease.   Requiring hemodialysis. First treatment on 8/18.  IV  contrast exposure on 8/16. Ultrasound -negative for obstruction or mass.   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  Lab Results  Component Value Date   CREATININE 3.87 (H) 05/08/2020   CREATININE 3.81 (H) 05/07/2020   CREATININE 4.96 (H) 05/06/2020   Received dialysis yesterday Will continue monitoring daily for dialysis requirement No need for additional dialysis today Volume and electrolyte status acceptable Trial of HD tomorrow  Will need to be able to sit in chair for HD prior to discharge    #. Anemia of chronic kidney disease:  Lab Results  Component Value Date   HGB 8.0 (L) 05/08/2020   -Continue Epogen with dialysis treatments - Low threshold for PRBC transfusion.   #Lower extremity edema Continue oral torsemide  #Fever Continues with  low grade fever intermittently  Receiving Anidulafungin IV,started on 05/03/20 Will continue monitoring for sepsis    LOS: 24 Kieon Lawhorn 9/9/20212:24 PM

## 2020-05-08 NOTE — Progress Notes (Signed)
Cross Cover Note RN reports patient refusing to take meds.  Also reported patient has made statements such as 'I'd rather die than take more meds' and that it 'didn't matter cause she will never get to see her kids again'.  Heart rate increased above 160 a few hours after missing her nightime amiodarone ordered Metoprolol 5 mg IV was given with noted improvement in her heart rate.  Consulted BH/psychiatry reguarding need for evaluation

## 2020-05-08 NOTE — Progress Notes (Signed)
PT Cancellation Note  Patient Details Name: Melanie Bowen MRN: [id number] DOB: 10-18-1960   Cancelled Treatment:    Reason Eval/Treat Not Completed: Other (comment)   Chart reviewed.  Will defer session after reading notes and recent OT interaction.  Will continue as appropriate.   Chesley Noon 05/08/2020, 3:23 PM

## 2020-05-08 NOTE — Progress Notes (Signed)
OT Cancellation Note  Patient Details Name: Melanie Bowen MRN: [id number] DOB: 07/14/1961   Cancelled Treatment:    Reason Eval/Treat Not Completed: Patient declined, no reason specified. Pt supine in bed and alert at this time. She refuses OT intervention again today. Pt with very tangential speech and asking therapist to "take me downstairs to get that little boy". OT notified RN of pt confusion. Pt continues to adamantly refuse OT intervention. Pt remained supine in bed with all needs within reach. OT will follow up as able.   Darleen Crocker, [place], OTR/L , CBIS ascom [phone]  05/08/20, 2:47 PM  05/08/2020, 2:44 PM

## 2020-05-08 NOTE — TOC Transition Note (Signed)
Transition of Care [facility]) - CM/SW Discharge Note   Patient Details  Name: Melanie Bowen MRN: [id number] Date of Birth: 01/28/1961  Transition of Care [facility]) CM/SW Contact:  Beverly Sessions, RN Phone Number: 05/08/2020, 2:33 PM   Clinical Narrative:    Per nursing patient A&Ox1 TOC reached out to daughter [name]. She states that she, patient's sister and patient are in agreement for SNF.    At this time only bed offer is [facility].  This option was presented daughter.  She is going to discuss it with the patient tonight.   Psych and Palliative consult pending for today  Notified bedside RN, and MD that patient will have to sit in chair for HD prior to discharge          Patient Goals and CMS Choice        Discharge Placement                       Discharge Plan and Services                                     Social Determinants of Health (SDOH) Interventions     Readmission Risk Interventions Readmission Risk Prevention Plan 11/08/2019  Transportation Screening Complete  Medication Review Press photographer) Complete  PCP or Specialist appointment within 3-5 days of discharge Not Complete  PCP/Specialist Appt Not Complete comments Her PCP is out for 2-3 weeks so other two providers are booked out. The receptionist will call her if there is a cancellation.  [place] or Home Care Consult Complete  SW Recovery Care/Counseling Consult Complete  Palliative Care Screening Not Applicable  Skilled Nursing Facility Not Applicable  Some recent data might be hidden

## 2020-05-09 DIAGNOSIS — R630 Anorexia: Secondary | ICD-10-CM

## 2020-05-09 DIAGNOSIS — R509 Fever, unspecified: Secondary | ICD-10-CM

## 2020-05-09 DIAGNOSIS — G9340 Encephalopathy, unspecified: Secondary | ICD-10-CM

## 2020-05-09 DIAGNOSIS — G9341 Metabolic encephalopathy: Secondary | ICD-10-CM | POA: Diagnosis not present

## 2020-05-09 DIAGNOSIS — K5720 Diverticulitis of large intestine with perforation and abscess without bleeding: Secondary | ICD-10-CM | POA: Diagnosis not present

## 2020-05-09 DIAGNOSIS — R6521 Severe sepsis with septic shock: Secondary | ICD-10-CM | POA: Diagnosis not present

## 2020-05-09 DIAGNOSIS — A419 Sepsis, unspecified organism: Secondary | ICD-10-CM | POA: Diagnosis not present

## 2020-05-09 LAB — CBC
HCT: 20.4 % — ABNORMAL LOW (ref 36.0–46.0)
Hemoglobin: 6.9 g/dL — ABNORMAL LOW (ref 12.0–15.0)
MCH: 30.1 pg (ref 26.0–34.0)
MCHC: 33.8 g/dL (ref 30.0–36.0)
MCV: 89.1 fL (ref 80.0–100.0)
Platelets: 380 10*3/uL (ref 150–400)
RBC: 2.29 MIL/uL — ABNORMAL LOW (ref 3.87–5.11)
RDW: 17.2 % — ABNORMAL HIGH (ref 11.5–15.5)
WBC: 2.8 10*3/uL — ABNORMAL LOW (ref 4.0–10.5)
nRBC: 1.4 % — ABNORMAL HIGH (ref 0.0–0.2)

## 2020-05-09 LAB — BASIC METABOLIC PANEL
Anion gap: 12 (ref 5–15)
BUN: 60 mg/dL — ABNORMAL HIGH (ref 6–20)
CO2: 30 mmol/L (ref 22–32)
Calcium: 7.3 mg/dL — ABNORMAL LOW (ref 8.9–10.3)
Chloride: 101 mmol/L (ref 98–111)
Creatinine, Ser: 3.74 mg/dL — ABNORMAL HIGH (ref 0.44–1.00)
GFR calc Af Amer: 15 mL/min — ABNORMAL LOW (ref >60)
GFR calc non Af Amer: 13 mL/min — ABNORMAL LOW (ref >60)
Glucose, Bld: 85 mg/dL (ref 70–99)
Potassium: 3.6 mmol/L (ref 3.5–5.1)
Sodium: 143 mmol/L (ref 135–145)

## 2020-05-09 LAB — MAGNESIUM: Magnesium: 2.1 mg/dL (ref 1.7–2.4)

## 2020-05-09 LAB — PREPARE RBC (CROSSMATCH)

## 2020-05-09 MED ORDER — SODIUM CHLORIDE 0.9% IV SOLUTION: Freq: Once | INTRAVENOUS | Status: AC

## 2020-05-09 MED ORDER — SODIUM CHLORIDE 0.9 % IV SOLN
1.0000 g | Freq: Two times a day (BID) | INTRAVENOUS | Status: DC
  Administered 2020-05-09: 1 g via INTRAVENOUS
  Filled 2020-05-09 (×3): qty 1

## 2020-05-09 NOTE — Progress Notes (Signed)
ID Not taking oral meds Receiving blood transfusion Patient Vitals for the past 24 hrs:  BP Temp Temp src Pulse Resp SpO2 Weight  05/09/20 2009 (!) 95/56 98.5 F (36.9 C) Oral (!) 113 18 98 % --  05/09/20 1727 113/70 (!) 101.1 F (38.4 C) -- (!) 111 14 -- --  05/09/20 1726 113/70 (!) 101.1 F (38.4 C) Oral (!) 111 14 99 % --  05/09/20 1656 112/64 98.8 F (37.1 C) Oral (!) 115 16 100 % --  05/09/20 1639 119/60 (!) 101.2 F (38.4 C) Oral (!) 105 16 100 % --  05/09/20 1315 (!) 151/46 98.2 F (36.8 C) Oral (!) 116 16 94 % --  05/09/20 1200 -- -- -- -- -- 97 % --  05/09/20 1145 -- -- -- -- -- 98 % --  05/09/20 1130 115/86 -- -- (!) 123 (!) 25 97 % --  05/09/20 1115 112/60 -- -- -- (!) 25 96 % --  05/09/20 1100 (!) 114/53 -- -- (!) 120 (!) 32 96 % --  05/09/20 1045 123/62 -- -- (!) 117 (!) 31 96 % --  05/09/20 1030 (!) 117/58 -- -- (!) 119 (!) 27 100 % --  05/09/20 1015 -- -- -- (!) 112 (!) 24 100 % --  05/09/20 1000 124/64 -- -- (!) 105 (!) 21 100 % --  05/09/20 0957 120/66 -- -- (!) 113 (!) 28 100 % --  05/09/20 0951 (!) 124/59 99.1 F (37.3 C) Oral (!) 105 (!) 25 100 % 119 kg  05/09/20 0500 -- 98.5 F (36.9 C) Oral (!) 101 -- -- --  05/09/20 0359 125/80 98.7 F (37.1 C) Oral (!) 116 20 100 % --    Chest b/l crepts Thick secretions Abd soft distension    Labs CBC Latest Ref Rng & Units 05/09/2020 05/08/2020 05/07/2020  WBC 4.0 - 10.5 K/uL 2.8(L) 4.1 3.4(L)  Hemoglobin 12.0 - 15.0 g/dL 6.9(L) 8.0(L) 7.2(L)  Hematocrit 36 - 46 % 20.4(L) 24.2(L) 21.8(L)  Platelets 150 - 400 K/uL 380 341 302    CMP Latest Ref Rng & Units 05/09/2020 05/08/2020 05/07/2020  Glucose 70 - 99 mg/dL 85 80 86  BUN 6 - 20 mg/dL 60(H) 54(H) 60(H)  Creatinine 0.44 - 1.00 mg/dL 3.74(H) 3.87(H) 3.81(H)  Sodium 135 - 145 mmol/L 143 139 140  Potassium 3.5 - 5.1 mmol/L 3.6 3.7 3.6  Chloride 98 - 111 mmol/L 101 98 99  CO2 22 - 32 mmol/L 30 27 28   Calcium 8.9 - 10.3 mg/dL 7.3(L) 7.3(L) 7.5(L)  Total Protein 6.5  - 8.1 g/dL - - -  Total Bilirubin 0.3 - 1.2 mg/dL - - -  Alkaline Phos 38 - 126 U/L - - -  AST 15 - 41 U/L - - -  ALT 0 - 44 U/L - - -    Impression/recommendation  Impression/recommendation Septic shocksecondary to perforated diverticulitisand peridiverticular abscess. pseudomonas, kleb andESBLe.coli in the culture. JP drain inserted on 04/14/20.  meropenem 04/15/20>>05/06/20 anidulafungin 8/17>>8/24and restarted on 9/3/21for fever and leucocytosis-both had resolved Fever today 9/10  Will restart meropenem- will need peritoneal fluid aspiraiton for culture  Encephalopathy-metabolic -fluctuating-   Hypoxic resp failureon presentation-needed intubation for a few days s/p extubation Rigorous  incentive spirometry Chest PT because of secretions and atelectasis    Transaminitis due to shock liverresolved  AKI due to septic shock-getting dialysis  Anasarca Rt arm swelling- DVT was ruled out on 04/22/20- may have to repeat imaging   HIV- well controlled in  jan 2021 her Vl <20 and cd4 >1000.  On 05/03/20 Vl is < 20 [id number] ( 50%) On dolutegravir and rilpivirine but not taking any oral medications   Afib on amiodarone but nit taking it   Transaminitis due to shock liverresolved  Thrombocytopenia-resolved Discussed the management with the care team

## 2020-05-09 NOTE — Progress Notes (Signed)
Pharmacy Antibiotic Note  RINDI BEECHY is a 59 y.o. female admitted on 04/14/2020 with intra abdominal.  Pharmacy has been consulted for Meropenem dosing.  Plan: Meropenem 1 gm IV Q12H ordered to start on 9/10 @ 2200  Height: 5\' 6"  (167.6 cm) Weight: 119 kg (262 lb 5.6 oz) IBW/kg (Calculated) : 59.3  Temp (24hrs), Avg:99.5 F (37.5 C), Min:98.2 F (36.8 C), Max:101.2 F (38.4 C)  Recent Labs  Lab 05/05/20 0831 05/06/20 0701 05/07/20 1547 05/08/20 1137 05/09/20 0930  WBC 7.2 5.4 3.4* 4.1 2.8*  CREATININE 4.66* 4.96* 3.81* 3.87* 3.74*    Estimated Creatinine Clearance: 21.5 mL/min (A) (by C-G formula based on SCr of 3.74 mg/dL (H)).    Allergies  Allergen Reactions  . Acetaminophen Nausea And Vomiting  . Ibuprofen Other (See Comments)    Reports causes bleeding  . Gabapentin Rash  . Morphine Itching and Rash  . Morphine And Related Itching  . Zanaflex  [Tizanidine] Rash    Antimicrobials this admission:   >>    >>   Dose adjustments this admission:   Microbiology results:  BCx:   UCx:    Sputum:    MRSA PCR:   Thank you for allowing pharmacy to be a part of this patient's care.  Clorissa Gruenberg D 05/09/2020 9:22 PM

## 2020-05-09 NOTE — Plan of Care (Signed)
Continuing with plan of care. 

## 2020-05-09 NOTE — TOC Progression Note (Signed)
Transition of Care [facility]) - Progression Note    Patient Details  Name: Melanie Bowen MRN: [id number] Date of Birth: 02/11/1961  Transition of Care [facility]) CM/SW Contact  Beverly Sessions, RN Phone Number: 05/09/2020, 1:23 PM  Clinical Narrative:      [facility] can offer a bed Per Tammy at [facility] they can offer a bed on Sunday.    Disposition discussed with daughter.  She is aware that patient is not currently working with therapy.  If patient continues to not work with therapy she will not be a candidate for rehab at [facility].  Discussed option of LTC placement.  They are not interested in this time.    Daughter notified that if patient is not a candidate for rehab she will have to discharge to home setting.  Daughter to speak with patient's sister Lynelle Smoke tonight to discuss who would be able to stay with the patient after discharge  Discussed with PT Colletta [place].  PT will contact daughter tomorrow to schedule a time tomorrow to work with patient while daughter is here.       Expected Discharge Plan and Services                                                 Social Determinants of Health (SDOH) Interventions    Readmission Risk Interventions Readmission Risk Prevention Plan 11/08/2019  Transportation Screening Complete  Medication Review Press photographer) Complete  PCP or Specialist appointment within 3-5 days of discharge Not Complete  PCP/Specialist Appt Not Complete comments Her PCP is out for 2-3 weeks so other two providers are booked out. The receptionist will call her if there is a cancellation.  [place] or Home Care Consult Complete  SW Recovery Care/Counseling Consult Complete  Palliative Care Screening Not Applicable  Skilled Nursing Facility Not Applicable  Some recent data might be hidden

## 2020-05-09 NOTE — Progress Notes (Signed)
PT Cancellation Note  Patient Details Name: Melanie Bowen MRN: [id number] DOB: 06/30/1961   Cancelled Treatment:    Reason Eval/Treat Not Completed: Other (comment) Pt off the unit to hemodialysis. Will attempt another date/time when pt available.   Vale Haven 05/09/2020, 11:38 AM

## 2020-05-09 NOTE — Progress Notes (Addendum)
PROGRESS NOTE    Melanie Bowen  ZMO:[id number] DOB: [birth year] DOA: 04/14/2020 PCP: Theotis Burrow, MD  Assessment & Plan:   Principal Problem:   Severe sepsis with septic shock ([place]) Active Problems:   HTN (hypertension)   HIV (human immunodeficiency virus infection) ([place])   Diverticulitis of large intestine with abscess without bleeding   Abscess of sigmoid colon due to diverticulitis   Acute renal failure (ARF) ([place])   Hypotension   Diverticulosis   Acute respiratory distress   DNR (do not resuscitate) discussion   Palliative care by specialist   Dyspnea   Goals of care, counseling/discussion   Pressure injury of skin   Non-compliance: pt has been intermittently refusing to take medications and participate w/ therapy (PT/OT). Dicussed this with the pt as well the pt's daughter [name] with no improvement so far. Psych saw pt but the pt refused to take any anti-depressant or anti-anxiety meds. Palliative care saw the pt and will continue w/ current treatments but no BiPAP or intubation.  Septic shock:  resolved  Acute sigmoid diverticulitis and diverticular abscess: s/p CT-guided drainage by IR on 04/14/2020.  Fluid culture showed Klebsiella pneumonia, pseudomonas aeruginosa and E. Coli. Completed IV merrem course. Continue on IV anidulafungin & will switch to po fluconazole at d/c as per ID  AKI: continue on HD as per nephro. Has an outpatient HD spot set up  A. Fib: continue on amiodarone. Will consider long-term anticoagulation in the future  Acute normocytic anemia: s/p 2 units of pRBCs. Hb is 6.9 & Hct 20.4. Will transfuse 1 unit of pRBCs today.  Will continue to monitor   COPD exacerbation: resolved. Continue on bronchodilators & incentive spirometry   HIV infection: continue on rilpivirine, dolutegravir as per ID   Acute liver failure: resolved  Leukocytosis: resolved  Acute hypoxemic respiratory failure: respiratory status is labile. Back on  supplemental oxygen today   Acute metabolic encephalopathy/delirium: recurrent.  No acute abnormality on CT head. Re-orient prn   History of stroke: not on statin, aspirin or plavix at home.   DVT prophylaxis: heparin  Code Status: full  Family Communication:  Disposition Plan:  Likely d/c to SNF  Status is: Inpatient  Remains inpatient appropriate because:IV treatments appropriate due to intensity of illness or inability to take PO, will receive 1 unit of pRBCs today    Dispo: The patient is from: Home              Anticipated d/c is to: SNF              Anticipated d/c date is: > 3 days              Patient currently is not medically stable to d/c.     Consultants:   nephro   gen surg   Vascular surg    Procedures:   Antimicrobials: completed course   Subjective: Pt c/o being thirsty   Objective: Vitals:   05/09/20 1130 05/09/20 1145 05/09/20 1200 05/09/20 1315  BP: 115/86   (!) 151/46  Pulse: (!) 123   (!) 116  Resp: (!) 25   16  Temp:    98.2 F (36.8 C)  TempSrc:    Oral  SpO2: 97% 98% 97% 94%  Weight:      Height:        Intake/Output Summary (Last 24 hours) at 05/09/2020 1358 Last data filed at 05/09/2020 1200 Gross per 24 hour  Intake 190 ml  Output -144 ml  Net 334 ml   Filed Weights   05/02/20 0358 05/03/20 0547 05/09/20 0951  Weight: 135.2 kg 130.9 kg 119 kg    Examination:   General exam: appears lethargic Respiratory system: diminished breath sounds b/l  Cardiovascular system: irregularly irregular. No rubs, gallops or clicks.  Gastrointestinal system: Abdomen is obese, soft and nontender. Normal bowel sounds  Central nervous system: Lethargic. Moves all 4 extremities  Psychiatry: Judgement and insight appear abnormal. Very flat mood and affect     Data Reviewed: I have personally reviewed following labs and imaging studies  CBC: Recent Labs  Lab 05/03/20 0636 05/04/20 0543 05/04/20 0543 05/05/20 0831 05/06/20 0701  05/07/20 1547 05/08/20 1137 05/09/20 0930  WBC 18.4* 11.0*   < > 7.2 5.4 3.4* 4.1 2.8*  NEUTROABS 16.9* 9.4*  --  5.9 4.1  --   --   --   HGB 7.8* 6.7*   < > 6.8* 7.8* 7.2* 8.0* 6.9*  HCT 23.6* 19.5*   < > 20.6* 23.3* 21.8* 24.2* 20.4*  MCV 93 89.9   < > 91.6 89.6 92.4 93.1 89.1  PLT 131* 162   < > 216 274 302 341 380   < > = values in this interval not displayed.   Basic Metabolic Panel: Recent Labs  Lab 05/04/20 0543 05/04/20 0543 05/05/20 0831 05/06/20 0701 05/07/20 1531 05/07/20 1547 05/08/20 1137 05/09/20 0930  NA 136   < > 139 139  --  140 139 143  K 3.7   < > 3.9 4.1  --  3.6 3.7 3.6  CL 98   < > 101 99  --  99 98 101  CO2 25   < > 26 27  --  28 27 30   GLUCOSE 93   < > 103* 101*  --  86 80 85  BUN 52*   < > 66* 72*  --  60* 54* 60*  CREATININE 4.15*   < > 4.66* 4.96*  --  3.81* 3.87* 3.74*  CALCIUM 7.6*   < > 7.5* 7.7*  --  7.5* 7.3* 7.3*  MG 1.8   < > 1.9 2.1 2.2  --  1.8 2.1  PHOS 5.8*  --  6.4* 7.5*  --  5.1*  --   --    < > = values in this interval not displayed.   GFR: Estimated Creatinine Clearance: 21.5 mL/min (A) (by C-G formula based on SCr of 3.74 mg/dL (H)). Liver Function Tests: Recent Labs  Lab 05/03/20 0636 05/04/20 0543 05/05/20 0831 05/06/20 0701 05/07/20 1547  AST 27  --   --   --   --   ALT 18  --   --   --   --   ALKPHOS 101  --   --   --   --   BILITOT 1.4*  --   --   --   --   PROT 5.8*  --   --   --   --   ALBUMIN 1.5* 1.3* 1.3* 1.3* 1.2*   No results for input(s): LIPASE, AMYLASE in the last 168 hours. No results for input(s): AMMONIA in the last 168 hours. Coagulation Profile: No results for input(s): INR, PROTIME in the last 168 hours. Cardiac Enzymes: No results for input(s): CKTOTAL, CKMB, CKMBINDEX, TROPONINI in the last 168 hours. BNP (last 3 results) No results for input(s): PROBNP in the last 8760 hours. HbA1C: No results for input(s): HGBA1C in the last 72 hours. CBG: Recent Labs  Lab 05/03/20 0844  GLUCAP 78    Lipid Profile: No results for input(s): CHOL, HDL, LDLCALC, TRIG, CHOLHDL, LDLDIRECT in the last 72 hours. Thyroid Function Tests: No results for input(s): TSH, T4TOTAL, FREET4, T3FREE, THYROIDAB in the last 72 hours. Anemia Panel: No results for input(s): VITAMINB12, FOLATE, FERRITIN, TIBC, IRON, RETICCTPCT in the last 72 hours. Sepsis Labs: No results for input(s): PROCALCITON, LATICACIDVEN in the last 168 hours.  Recent Results (from the past 240 hour(s))  CULTURE, BLOOD (ROUTINE X 2) w Reflex to ID Panel     Status: None   Collection Time: 05/01/20  2:50 PM   Specimen: BLOOD  Result Value Ref Range Status   Specimen Description BLOOD BLOOD LEFT HAND  Final   Special Requests   Final    BOTTLES DRAWN AEROBIC AND ANAEROBIC Blood Culture adequate volume   Culture   Final    NO GROWTH 5 DAYS Performed at [facility], [address] [place], [place] [address]    Report Status 05/06/2020 FINAL  Final  CULTURE, BLOOD (ROUTINE X 2) w Reflex to ID Panel     Status: None   Collection Time: 05/01/20  2:51 PM   Specimen: BLOOD  Result Value Ref Range Status   Specimen Description BLOOD BLOOD RIGHT HAND  Final   Special Requests   Final    BOTTLES DRAWN AEROBIC AND ANAEROBIC Blood Culture adequate volume   Culture   Final    NO GROWTH 5 DAYS Performed at [facility], [address], [place], [place] [address]    Report Status 05/06/2020 FINAL  Final         Radiology Studies: No results found.      Scheduled Meds: . (feeding supplement) PROSource Plus  30 mL Oral TID BM  . sodium chloride   Intravenous Once  . acidophilus  1 capsule Oral Daily  . alteplase  2 mg Intracatheter Once  . alteplase  2 mg Intracatheter Once  . amiodarone  200 mg Oral BID  . Chlorhexidine Gluconate Cloth  6 each Topical Daily  . docusate sodium  200 mg Oral BID  . rilpivirine  25 mg Oral Q breakfast   And  . dolutegravir  50 mg Oral Q breakfast  . epoetin  (EPOGEN/PROCRIT) injection  10,000 Units Intravenous Q M,W,F-HD  . famotidine  10 mg Oral Q1200  . feeding supplement (NEPRO CARB STEADY)  237 mL Oral TID BM  . guaiFENesin-dextromethorphan  10 mL Oral Q8H  . heparin injection (subcutaneous)  5,000 Units Subcutaneous Q8H  . magnesium oxide  400 mg Oral Daily  . mometasone-formoterol  2 puff Inhalation BID  . multivitamin  1 tablet Oral QHS  . sodium chloride flush  5 mL Intracatheter Q8H  . torsemide  40 mg Oral Daily   Continuous Infusions: . sodium chloride 250 mL (05/03/20 0043)  . anidulafungin Stopped (05/08/20 2205)     LOS: 25 days    Time spent: 30 mins     Wyvonnia Dusky, MD [facility] Pager [phone]  If 7PM-7AM, please contact night-coverage [email or web address] 05/09/2020, 1:58 PM

## 2020-05-09 NOTE — Progress Notes (Addendum)
   05/09/20 1639  Assess: MEWS Score  Temp (!) 101.2 F (38.4 C)  BP 119/60  Pulse Rate (!) 105  Resp 16  SpO2 100 %  O2 Device Nasal Cannula  O2 Flow Rate (L/min) 3 L/min  Assess: MEWS Score  MEWS Temp 1  MEWS Systolic 0  MEWS Pulse 1  MEWS RR 0  MEWS LOC 0  MEWS Score 2  MEWS Score Color Yellow  Assess: if the MEWS score is Yellow or Red  Were vital signs taken at a resting state? Yes  Focused Assessment Change from prior assessment (see assessment flowsheet)  Early Detection of Sepsis Score *See Row Information* Low  MEWS guidelines implemented *See Row Information* Yes  Treat  MEWS Interventions Other (Comment) (notifed Dr. Jimmye Norman, continuing to monitor at this time)  Pain Scale 0-10  Pain Score 0  Take Vital Signs  Increase Vital Sign Frequency  Yellow: Q 2hr X 2 then Q 4hr X 2, if remains yellow, continue Q 4hrs  Escalate  MEWS: Escalate Yellow: discuss with charge nurse/RN and consider discussing with provider and RRT  Notify: Charge Nurse/RN  Name of Charge Nurse/RN Notified [place], RN  Date Charge Nurse/RN Notified 05/09/20  Time Charge Nurse/RN Notified 1640  Notify: Provider  Provider Name/Title Dr. Jimmye Norman  Date Provider Notified 05/09/20  Time Provider Notified 1635  Notification Type Page  Notification Reason Change in status  Response Other (Comment) (monitoring patient)  Date of Provider Response 05/09/20  Time of Provider Response 1640  Document  Patient Outcome Other (Comment) (monitoring at this time)  Progress note created (see row info) Yes   Pre-vital signs taken for blood administration and obtained abnormal vital signs.  Dr. Jimmye Norman notified and charge nurse.  Awaiting further direction for blood administration.  Dr. Jimmye Norman ordered to continue with blood administration and administer tylenol.

## 2020-05-09 NOTE — Progress Notes (Signed)
[facility]  ROUNDING NOTE   Subjective:   Patient more awake and alert today,still confused, Plan for dialysis today  Objective:  Vital signs in last 24 hours:  Temp:  [98.2 F (36.8 C)-99.1 F (37.3 C)] 98.2 F (36.8 C) (09/10 1315) Pulse Rate:  [95-123] 116 (09/10 1315) Resp:  [16-32] 16 (09/10 1315) BP: (112-151)/(46-86) 151/46 (09/10 1315) SpO2:  [88 %-100 %] 94 % (09/10 1315) Weight:  [397 kg] 119 kg (09/10 0951)  Weight change:  Filed Weights   05/02/20 0358 05/03/20 0547 05/09/20 0951  Weight: 135.2 kg 130.9 kg 119 kg    Intake/Output: I/O last 3 completed shifts: In: 190 [P.O.:60; IV Piggyback:130] Out: 10 [Drains:10]   Intake/Output this shift:  Total I/O In: -  Out: -154   Physical Exam: General: Awake, alert, in no acute distress  Lungs:  diminished at the bases  Heart: S1S2 Regular  Abdomen:  Obese, nontender  Extremities:  + Dependent peripheral edema.  Neurologic:  Confused, appears restless, did not answer questions today  Skin  Left inner thigh and left knee  with blisters  Access: RIJ permcath 8/27 Dr. [name]    Basic Metabolic Panel: Recent Labs  Lab 05/04/20 0543 05/04/20 0543 05/05/20 0831 05/05/20 0831 05/06/20 0701 05/06/20 0701 05/07/20 1531 05/07/20 1547 05/08/20 1137 05/09/20 0930  NA 136   < > 139  --  139  --   --  140 139 143  K 3.7   < > 3.9  --  4.1  --   --  3.6 3.7 3.6  CL 98   < > 101  --  99  --   --  99 98 101  CO2 25   < > 26  --  27  --   --  28 27 30   GLUCOSE 93   < > 103*  --  101*  --   --  86 80 85  BUN 52*   < > 66*  --  72*  --   --  60* 54* 60*  CREATININE 4.15*   < > 4.66*  --  4.96*  --   --  3.81* 3.87* 3.74*  CALCIUM 7.6*   < > 7.5*   < > 7.7*   < >  --  7.5* 7.3* 7.3*  MG 1.8   < > 1.9  --  2.1  --  2.2  --  1.8 2.1  PHOS 5.8*  --  6.4*  --  7.5*  --   --  5.1*  --   --    < > = values in this interval not displayed.    Liver Function Tests: Recent Labs  Lab 05/03/20 0636  05/04/20 0543 05/05/20 0831 05/06/20 0701 05/07/20 1547  AST 27  --   --   --   --   ALT 18  --   --   --   --   ALKPHOS 101  --   --   --   --   BILITOT 1.4*  --   --   --   --   PROT 5.8*  --   --   --   --   ALBUMIN 1.5* 1.3* 1.3* 1.3* 1.2*   No results for input(s): LIPASE, AMYLASE in the last 168 hours. No results for input(s): AMMONIA in the last 168 hours.  CBC: Recent Labs  Lab 05/03/20 0636 05/04/20 0543 05/04/20 0543 05/05/20 0831 05/06/20 0701 05/07/20 1547 05/08/20 1137  05/09/20 0930  WBC 18.4* 11.0*   < > 7.2 5.4 3.4* 4.1 2.8*  NEUTROABS 16.9* 9.4*  --  5.9 4.1  --   --   --   HGB 7.8* 6.7*   < > 6.8* 7.8* 7.2* 8.0* 6.9*  HCT 23.6* 19.5*   < > 20.6* 23.3* 21.8* 24.2* 20.4*  MCV 93 89.9   < > 91.6 89.6 92.4 93.1 89.1  PLT 131* 162   < > 216 274 302 341 380   < > = values in this interval not displayed.    Cardiac Enzymes: No results for input(s): CKTOTAL, CKMB, CKMBINDEX, TROPONINI in the last 168 hours.  BNP: Invalid input(s): POCBNP  CBG: Recent Labs  Lab 05/03/20 0844  GLUCAP 78    Microbiology: Results for orders placed or performed during the hospital encounter of 04/14/20  SARS Coronavirus 2 by RT PCR (hospital order, performed in [facility] hospital lab) Nasopharyngeal Nasopharyngeal Swab     Status: None   Collection Time: 04/14/20  2:17 AM   Specimen: Nasopharyngeal Swab  Result Value Ref Range Status   SARS Coronavirus 2 NEGATIVE NEGATIVE Final    Comment: (NOTE) SARS-CoV-2 target nucleic acids are NOT DETECTED.  The SARS-CoV-2 RNA is generally detectable in upper and lower respiratory specimens during the acute phase of infection. The lowest concentration of SARS-CoV-2 viral copies this assay can detect is 250 copies / mL. A negative result does not preclude SARS-CoV-2 infection and should not be used as the sole basis for treatment or other patient management decisions.  A negative result may occur with improper specimen  collection / handling, submission of specimen other than nasopharyngeal swab, presence of viral mutation(s) within the areas targeted by this assay, and inadequate number of viral copies (<250 copies / mL). A negative result must be combined with clinical observations, patient history, and epidemiological information.  Fact Sheet for Patients:   StrictlyIdeas.no  Fact Sheet for Healthcare Providers: BankingDealers.co.za  This test is not yet approved or  cleared by the [place] FDA and has been authorized for detection and/or diagnosis of SARS-CoV-2 by FDA under an Emergency Use Authorization (EUA).  This EUA will remain in effect (meaning this test can be used) for the duration of the COVID-19 declaration under Section 564(b)(1) of the Act, 21 U.S.C. section 360bbb-3(b)(1), unless the authorization is terminated or revoked sooner.  Performed at [facility], [address] [place], [place] [address]   CULTURE, BLOOD (ROUTINE X 2) w Reflex to ID Panel     Status: None   Collection Time: 04/14/20  8:14 AM   Specimen: BLOOD  Result Value Ref Range Status   Specimen Description BLOOD LEFT [facility]  Final   Special Requests   Final    BOTTLES DRAWN AEROBIC AND ANAEROBIC Blood Culture adequate volume   Culture   Final    NO GROWTH 5 DAYS Performed at [facility], [address] [place], [place] [address]    Report Status 04/19/2020 FINAL  Final  CULTURE, BLOOD (ROUTINE X 2) w Reflex to ID Panel     Status: None   Collection Time: 04/14/20  8:14 AM   Specimen: BLOOD  Result Value Ref Range Status   Specimen Description BLOOD LEFT WRIST  Final   Special Requests   Final    BOTTLES DRAWN AEROBIC AND ANAEROBIC Blood Culture adequate volume   Culture   Final    NO GROWTH 5 DAYS Performed at [facility], [address] [place], [place] [address]    Report Status 04/19/2020 FINAL  Final   Aerobic/Anaerobic Culture (surgical/deep wound)     Status: None   Collection Time: 04/14/20 11:14 AM   Specimen: Abscess  Result Value Ref Range Status   Specimen Description   Final    ABSCESS Performed at [facility], [address], [place], [place] [address]    Special Requests   Final    NONE Performed at [facility], [place], [place] [address]    Gram Stain   Final    NO WBC SEEN ABUNDANT GRAM NEGATIVE RODS FEW GRAM POSITIVE COCCI FEW GRAM POSITIVE RODS Performed at [facility], [place] [address], [place], [place] [address]    Culture   Final    ABUNDANT KLEBSIELLA PNEUMONIAE ABUNDANT PSEUDOMONAS AERUGINOSA MODERATE ESCHERICHIA COLI Confirmed Extended Spectrum Beta-Lactamase Producer (ESBL).  In bloodstream infections from ESBL organisms, carbapenems are preferred over piperacillin/tazobactam. They are shown to have a lower risk of mortality. MIXED ANAEROBIC FLORA PRESENT.  CALL LAB IF FURTHER IID REQUIRED.    Report Status 04/20/2020 FINAL  Final   Organism ID, Bacteria KLEBSIELLA PNEUMONIAE  Final   Organism ID, Bacteria PSEUDOMONAS AERUGINOSA  Final   Organism ID, Bacteria ESCHERICHIA COLI  Final      Susceptibility   Escherichia coli - MIC*    AMPICILLIN >=32 RESISTANT Resistant     CEFAZOLIN >=64 RESISTANT Resistant     CEFEPIME 16 RESISTANT Resistant     CEFTAZIDIME RESISTANT Resistant     CEFTRIAXONE >=64 RESISTANT Resistant     CIPROFLOXACIN >=4 RESISTANT Resistant     GENTAMICIN <=1 SENSITIVE Sensitive     IMIPENEM <=0.25 SENSITIVE Sensitive     TRIMETH/SULFA >=320 RESISTANT Resistant     AMPICILLIN/SULBACTAM >=32 RESISTANT Resistant     PIP/TAZO 8 SENSITIVE Sensitive     * MODERATE ESCHERICHIA COLI   Klebsiella pneumoniae - MIC*    AMPICILLIN >=32 RESISTANT Resistant     CEFAZOLIN <=4 SENSITIVE Sensitive     CEFEPIME <=0.12 SENSITIVE Sensitive     CEFTAZIDIME <=1 SENSITIVE Sensitive     CEFTRIAXONE <=0.25  SENSITIVE Sensitive     CIPROFLOXACIN <=0.25 SENSITIVE Sensitive     GENTAMICIN <=1 SENSITIVE Sensitive     IMIPENEM <=0.25 SENSITIVE Sensitive     TRIMETH/SULFA <=20 SENSITIVE Sensitive     AMPICILLIN/SULBACTAM 16 INTERMEDIATE Intermediate     PIP/TAZO 8 SENSITIVE Sensitive     * ABUNDANT KLEBSIELLA PNEUMONIAE   Pseudomonas aeruginosa - MIC*    CEFTAZIDIME 4 SENSITIVE Sensitive     CIPROFLOXACIN <=0.25 SENSITIVE Sensitive     GENTAMICIN 2 SENSITIVE Sensitive     IMIPENEM 2 SENSITIVE Sensitive     PIP/TAZO 8 SENSITIVE Sensitive     CEFEPIME 4 SENSITIVE Sensitive     * ABUNDANT PSEUDOMONAS AERUGINOSA  MRSA PCR Screening     Status: None   Collection Time: 04/14/20  6:19 PM   Specimen: Nasopharyngeal  Result Value Ref Range Status   MRSA by PCR NEGATIVE NEGATIVE Final    Comment:        The GeneXpert MRSA Assay (FDA approved for NASAL specimens only), is one component of a comprehensive MRSA colonization surveillance program. It is not intended to diagnose MRSA infection nor to guide or monitor treatment for MRSA infections. Performed at [facility], [address] [place], [place] [address]   Urine Culture     Status:  None   Collection Time: 04/15/20  4:28 AM   Specimen: Urine, Random  Result Value Ref Range Status   Specimen Description   Final    URINE, RANDOM Performed at [facility], [address], [place], [place] [address]    Special Requests   Final    NONE Performed at [facility], [address] [place], [place] [address]    Culture   Final    NO GROWTH Performed at [facility], [place] [address] [place], [place] [address]    Report Status 04/16/2020 FINAL  Final  CULTURE, BLOOD (ROUTINE X 2) w Reflex to ID Panel     Status: None   Collection Time: 04/22/20  6:12 PM   Specimen: BLOOD  Result Value Ref Range Status   Specimen Description BLOOD FOOT  Final   Special Requests   Final    BOTTLES DRAWN AEROBIC AND  ANAEROBIC Blood Culture adequate volume   Culture   Final    NO GROWTH 5 DAYS Performed at [facility], [address] [place], [place] [address]    Report Status 04/27/2020 FINAL  Final  CULTURE, BLOOD (ROUTINE X 2) w Reflex to ID Panel     Status: None   Collection Time: 04/22/20  6:17 PM   Specimen: BLOOD  Result Value Ref Range Status   Specimen Description BLOOD FOOT  Final   Special Requests   Final    BOTTLES DRAWN AEROBIC AND ANAEROBIC Blood Culture results may not be optimal due to an excessive volume of blood received in culture bottles   Culture   Final    NO GROWTH 5 DAYS Performed at [facility], [address] [place], [place] [address]    Report Status 04/27/2020 FINAL  Final  CULTURE, BLOOD (ROUTINE X 2) w Reflex to ID Panel     Status: None   Collection Time: 05/01/20  2:50 PM   Specimen: BLOOD  Result Value Ref Range Status   Specimen Description BLOOD BLOOD LEFT HAND  Final   Special Requests   Final    BOTTLES DRAWN AEROBIC AND ANAEROBIC Blood Culture adequate volume   Culture   Final    NO GROWTH 5 DAYS Performed at [facility], [address] [place], [place] [address]    Report Status 05/06/2020 FINAL  Final  CULTURE, BLOOD (ROUTINE X 2) w Reflex to ID Panel     Status: None   Collection Time: 05/01/20  2:51 PM   Specimen: BLOOD  Result Value Ref Range Status   Specimen Description BLOOD BLOOD RIGHT HAND  Final   Special Requests   Final    BOTTLES DRAWN AEROBIC AND ANAEROBIC Blood Culture adequate volume   Culture   Final    NO GROWTH 5 DAYS Performed at [facility], [address] [place], [place] [address]    Report Status 05/06/2020 FINAL  Final    Coagulation Studies: No results for input(s): LABPROT, INR in the last 72 hours.  Urinalysis: No results for input(s): COLORURINE, LABSPEC, PHURINE, GLUCOSEU, HGBUR, BILIRUBINUR, KETONESUR, PROTEINUR, UROBILINOGEN, NITRITE, LEUKOCYTESUR in the last 72  hours.  Invalid input(s): APPERANCEUR    Imaging: No results found.   Medications:   . sodium chloride 250 mL (05/03/20 0043)  . anidulafungin Stopped (05/08/20 2205)   . (feeding supplement) PROSource Plus  30 mL Oral TID BM  . acidophilus  1 capsule Oral Daily  . alteplase  2 mg  Intracatheter Once  . alteplase  2 mg Intracatheter Once  . amiodarone  200 mg Oral BID  . Chlorhexidine Gluconate Cloth  6 each Topical Daily  . docusate sodium  200 mg Oral BID  . rilpivirine  25 mg Oral Q breakfast   And  . dolutegravir  50 mg Oral Q breakfast  . epoetin (EPOGEN/PROCRIT) injection  10,000 Units Intravenous Q M,W,F-HD  . famotidine  10 mg Oral Q1200  . feeding supplement (NEPRO CARB STEADY)  237 mL Oral TID BM  . guaiFENesin-dextromethorphan  10 mL Oral Q8H  . heparin injection (subcutaneous)  5,000 Units Subcutaneous Q8H  . magnesium oxide  400 mg Oral Daily  . mometasone-formoterol  2 puff Inhalation BID  . multivitamin  1 tablet Oral QHS  . sodium chloride flush  5 mL Intracatheter Q8H  . torsemide  40 mg Oral Daily   acetaminophen **OR** acetaminophen, albuterol, ALPRAZolam, bisacodyl, diphenhydrAMINE, heparin NICU/SCN flush, HYDROmorphone (DILAUDID) injection, iohexol, ipratropium-albuterol, labetalol, magic mouthwash **AND** lidocaine, magnesium hydroxide, menthol-cetylpyridinium, oxyCODONE-acetaminophen, phenol, simethicone  Assessment/ Plan:  Melanie Bowen is a 59 y.o. black female with HIV, COPD, hypertension, GERD, who is admitted to [facility] on 04/14/2020 for Dyspnea [R06.00] Colonic diverticular abscess [K57.20] Diverticulitis of large intestine with abscess without bleeding [K57.20] Abscess of sigmoid colon due to diverticulitis [K57.20]  #Acute renal failure   No history of chronic kidney disease.   Requiring hemodialysis. First treatment on 8/18.  IV contrast exposure on 8/16. Ultrasound -negative for obstruction or mass.   Do not suspect HIV induced  nephropathy Remains encephalopathic  Lab Results  Component Value Date   CREATININE 3.74 (H) 05/09/2020   CREATININE 3.87 (H) 05/08/2020   CREATININE 3.81 (H) 05/07/2020   Trial of HD in chair today Outpatient chair at [facility] MWF schedule.   #. Anemia of chronic kidney disease:  Lab Results  Component Value Date   HGB 6.9 (L) 05/09/2020   -Continue Epogen with dialysis treatments - Low threshold for PRBC transfusion.   #Lower extremity edema Continue oral torsemide  #Fever Receiving Anidulafungin IV,started on 05/03/20 Will continue monitoring for sepsis    LOS: 25 Allysia Ingles 9/10/20214:30 PM

## 2020-05-10 LAB — BASIC METABOLIC PANEL
Anion gap: 12 (ref 5–15)
BUN: 41 mg/dL — ABNORMAL HIGH (ref 6–20)
CO2: 27 mmol/L (ref 22–32)
Calcium: 7.6 mg/dL — ABNORMAL LOW (ref 8.9–10.3)
Chloride: 102 mmol/L (ref 98–111)
Creatinine, Ser: 2.96 mg/dL — ABNORMAL HIGH (ref 0.44–1.00)
GFR calc Af Amer: 19 mL/min — ABNORMAL LOW (ref >60)
GFR calc non Af Amer: 17 mL/min — ABNORMAL LOW (ref >60)
Glucose, Bld: 77 mg/dL (ref 70–99)
Potassium: 3.7 mmol/L (ref 3.5–5.1)
Sodium: 141 mmol/L (ref 135–145)

## 2020-05-10 LAB — MAGNESIUM: Magnesium: 2.1 mg/dL (ref 1.7–2.4)

## 2020-05-10 LAB — CBC
HCT: 26.1 % — ABNORMAL LOW (ref 36.0–46.0)
Hemoglobin: 8.5 g/dL — ABNORMAL LOW (ref 12.0–15.0)
MCH: 29.7 pg (ref 26.0–34.0)
MCHC: 32.6 g/dL (ref 30.0–36.0)
MCV: 91.3 fL (ref 80.0–100.0)
Platelets: 383 10*3/uL (ref 150–400)
RBC: 2.86 MIL/uL — ABNORMAL LOW (ref 3.87–5.11)
RDW: 18.4 % — ABNORMAL HIGH (ref 11.5–15.5)
WBC: 2.7 10*3/uL — ABNORMAL LOW (ref 4.0–10.5)
nRBC: 2.2 % — ABNORMAL HIGH (ref 0.0–0.2)

## 2020-05-10 MED ORDER — SODIUM CHLORIDE 0.9 % IV SOLN
500.0000 mg | Freq: Two times a day (BID) | INTRAVENOUS | Status: DC
  Filled 2020-05-10: qty 0.5

## 2020-05-10 MED ORDER — ACETAMINOPHEN 10 MG/ML IV SOLN
1000.0000 mg | Freq: Once | INTRAVENOUS | Status: AC
  Administered 2020-05-10: 1000 mg via INTRAVENOUS
  Filled 2020-05-10 (×2): qty 100

## 2020-05-10 MED ORDER — SODIUM CHLORIDE 0.9 % IV SOLN
500.0000 mg | Freq: Every day | INTRAVENOUS | Status: DC
  Administered 2020-05-10 – 2020-05-18 (×8): 500 mg via INTRAVENOUS
  Filled 2020-05-10: qty 500
  Filled 2020-05-10: qty 0.5
  Filled 2020-05-10 (×5): qty 500
  Filled 2020-05-10: qty 0.5
  Filled 2020-05-10: qty 500

## 2020-05-10 MED ORDER — DRONABINOL 2.5 MG PO CAPS
5.0000 mg | ORAL_CAPSULE | Freq: Two times a day (BID) | ORAL | Status: DC
  Administered 2020-05-11 – 2020-05-22 (×17): 5 mg via ORAL
  Filled 2020-05-10 (×18): qty 2

## 2020-05-10 NOTE — Progress Notes (Signed)
PROGRESS NOTE    Melanie Bowen  ZOX:[id number] DOB: 07-29-1961 DOA: 04/14/2020 PCP: Theotis Burrow, MD  Assessment & Plan:   Principal Problem:   Severe sepsis with septic shock ([place]) Active Problems:   HTN (hypertension)   HIV (human immunodeficiency virus infection) ([place])   Diverticulitis of large intestine with abscess without bleeding   Abscess of sigmoid colon due to diverticulitis   Acute renal failure (ARF) ([place])   Hypotension   Diverticulosis   Acute respiratory distress   DNR (do not resuscitate) discussion   Palliative care by specialist   Dyspnea   Goals of care, counseling/discussion   Pressure injury of skin   Non-compliance: pt continues to refuse to take medications today. Therapy will try to work again with the pt while the pt's daughter is present today. Pt's daughter [name], is aware of the pt's non-compliance. Psych saw pt but the pt refused to take any anti-depressant or anti-anxiety meds. Palliative care saw the pt and will continue w/ current treatments but no BiPAP or intubation.  Septic shock:  resolved  Acute sigmoid diverticulitis and diverticular abscess: s/p CT-guided drainage by IR on 04/14/2020.  Fluid culture showed Klebsiella pneumonia, pseudomonas aeruginosa and E. Coli. Completed IV merrem course but restarted on 05/09/20 for a fever as per ID. Continue on IV anidulafungin & will switch to po fluconazole at d/c as per ID  AKI: continue on HD as per nephro. Has an outpatient HD spot set up  A. Fib: continue on amiodarone. Will consider long-term anticoagulation in the future  Acute normocytic anemia: s/p 3 units of pRBCs. H&H are trending up today. Will continue to monitor   COPD exacerbation: resolved. Continue on bronchodilators & incentive spirometry   HIV infection: continue on rilpivirine, dolutegravir as per ID   Acute liver failure: resolved  Leukocytosis: resolved  Acute hypoxemic respiratory failure: respiratory  status is labile. Continue on supplemental oxygen and wean as tolerated  Acute metabolic encephalopathy/delirium: labile. No acute abnormality on CT head. Re-orient prn   History of stroke: not on statin, aspirin or plavix at home.   DVT prophylaxis: heparin  Code Status: full  Family Communication:  Disposition Plan:  Likely d/c to SNF  Status is: Inpatient  Remains inpatient appropriate because:IV treatments appropriate due to intensity of illness or inability to take PO, still refusing medications today    Dispo: The patient is from: Home              Anticipated d/c is to: SNF              Anticipated d/c date is: > 3 days              Patient currently is not medically stable to d/c.     Consultants:   nephro   gen surg    Vascular surg   ID   Procedures:   Antimicrobials: merrem   Subjective: Pt is lethargic and intermittently answers questions appropriately  Objective: Vitals:   05/09/20 2009 05/10/20 0100 05/10/20 0406 05/10/20 0518  BP: (!) 95/56  119/63   Pulse: (!) 113 (!) 101 98   Resp: 18  16   Temp: 98.5 F (36.9 C)  (!) 101.6 F (38.7 C) 98.5 F (36.9 C)  TempSrc: Oral  Oral Oral  SpO2: 98%  99%   Weight:      Height:        Intake/Output Summary (Last 24 hours) at 05/10/2020 0758 Last data filed at  05/10/2020 0300 Gross per 24 hour  Intake 580 ml  Output -124 ml  Net 704 ml   Filed Weights   05/02/20 0358 05/03/20 0547 05/09/20 0951  Weight: 135.2 kg 130.9 kg 119 kg    Examination: General exam: Appears lethargic Respiratory system:decreased breath sounds b/l  Cardiovascular system:S1 &S2 +. No rubs, gallops or clicks.  Gastrointestinal system:Abdomen is obese, soft and nontender.Hypoactive bowel souns Central nervous system:Lethargic. Moves all 4 extremities Psychiatry:Judgement and insight appear abnormal. Flat mood and affect      Data Reviewed: I have personally reviewed following labs and imaging  studies  CBC: Recent Labs  Lab 05/04/20 0543 05/04/20 0543 05/05/20 0831 05/05/20 0831 05/06/20 0701 05/07/20 1547 05/08/20 1137 05/09/20 0930 05/10/20 0605  WBC 11.0*   < > 7.2   < > 5.4 3.4* 4.1 2.8* 2.7*  NEUTROABS 9.4*  --  5.9  --  4.1  --   --   --   --   HGB 6.7*   < > 6.8*   < > 7.8* 7.2* 8.0* 6.9* 8.5*  HCT 19.5*   < > 20.6*   < > 23.3* 21.8* 24.2* 20.4* 26.1*  MCV 89.9   < > 91.6   < > 89.6 92.4 93.1 89.1 91.3  PLT 162   < > 216   < > 274 302 341 380 383   < > = values in this interval not displayed.   Basic Metabolic Panel: Recent Labs  Lab 05/04/20 0543 05/04/20 0543 05/05/20 0831 05/05/20 0831 05/06/20 0701 05/07/20 1531 05/07/20 1547 05/08/20 1137 05/09/20 0930 05/10/20 0605  NA 136   < > 139   < > 139  --  140 139 143 141  K 3.7   < > 3.9   < > 4.1  --  3.6 3.7 3.6 3.7  CL 98   < > 101   < > 99  --  99 98 101 102  CO2 25   < > 26   < > 27  --  28 27 30 27   GLUCOSE 93   < > 103*   < > 101*  --  86 80 85 77  BUN 52*   < > 66*   < > 72*  --  60* 54* 60* 41*  CREATININE 4.15*   < > 4.66*   < > 4.96*  --  3.81* 3.87* 3.74* 2.96*  CALCIUM 7.6*   < > 7.5*   < > 7.7*  --  7.5* 7.3* 7.3* 7.6*  MG 1.8   < > 1.9   < > 2.1 2.2  --  1.8 2.1 2.1  PHOS 5.8*  --  6.4*  --  7.5*  --  5.1*  --   --   --    < > = values in this interval not displayed.   GFR: Estimated Creatinine Clearance: 27.2 mL/min (A) (by C-G formula based on SCr of 2.96 mg/dL (H)). Liver Function Tests: Recent Labs  Lab 05/04/20 0543 05/05/20 0831 05/06/20 0701 05/07/20 1547  ALBUMIN 1.3* 1.3* 1.3* 1.2*   No results for input(s): LIPASE, AMYLASE in the last 168 hours. No results for input(s): AMMONIA in the last 168 hours. Coagulation Profile: No results for input(s): INR, PROTIME in the last 168 hours. Cardiac Enzymes: No results for input(s): CKTOTAL, CKMB, CKMBINDEX, TROPONINI in the last 168 hours. BNP (last 3 results) No results for input(s): PROBNP in the last 8760  hours. HbA1C: No results  for input(s): HGBA1C in the last 72 hours. CBG: Recent Labs  Lab 05/03/20 0844  GLUCAP 78   Lipid Profile: No results for input(s): CHOL, HDL, LDLCALC, TRIG, CHOLHDL, LDLDIRECT in the last 72 hours. Thyroid Function Tests: No results for input(s): TSH, T4TOTAL, FREET4, T3FREE, THYROIDAB in the last 72 hours. Anemia Panel: No results for input(s): VITAMINB12, FOLATE, FERRITIN, TIBC, IRON, RETICCTPCT in the last 72 hours. Sepsis Labs: No results for input(s): PROCALCITON, LATICACIDVEN in the last 168 hours.  Recent Results (from the past 240 hour(s))  CULTURE, BLOOD (ROUTINE X 2) w Reflex to ID Panel     Status: None   Collection Time: 05/01/20  2:50 PM   Specimen: BLOOD  Result Value Ref Range Status   Specimen Description BLOOD BLOOD LEFT HAND  Final   Special Requests   Final    BOTTLES DRAWN AEROBIC AND ANAEROBIC Blood Culture adequate volume   Culture   Final    NO GROWTH 5 DAYS Performed at [facility], [address] [place], [place] [address]    Report Status 05/06/2020 FINAL  Final  CULTURE, BLOOD (ROUTINE X 2) w Reflex to ID Panel     Status: None   Collection Time: 05/01/20  2:51 PM   Specimen: BLOOD  Result Value Ref Range Status   Specimen Description BLOOD BLOOD RIGHT HAND  Final   Special Requests   Final    BOTTLES DRAWN AEROBIC AND ANAEROBIC Blood Culture adequate volume   Culture   Final    NO GROWTH 5 DAYS Performed at [facility], [address], [place], [place] [address]    Report Status 05/06/2020 FINAL  Final         Radiology Studies: No results found.      Scheduled Meds: . (feeding supplement) PROSource Plus  30 mL Oral TID BM  . acidophilus  1 capsule Oral Daily  . alteplase  2 mg Intracatheter Once  . alteplase  2 mg Intracatheter Once  . amiodarone  200 mg Oral BID  . Chlorhexidine Gluconate Cloth  6 each Topical Daily  . docusate sodium  200 mg Oral BID  . rilpivirine  25 mg  Oral Q breakfast   And  . dolutegravir  50 mg Oral Q breakfast  . epoetin (EPOGEN/PROCRIT) injection  10,000 Units Intravenous Q M,W,F-HD  . famotidine  10 mg Oral Q1200  . feeding supplement (NEPRO CARB STEADY)  237 mL Oral TID BM  . guaiFENesin-dextromethorphan  10 mL Oral Q8H  . heparin injection (subcutaneous)  5,000 Units Subcutaneous Q8H  . magnesium oxide  400 mg Oral Daily  . mometasone-formoterol  2 puff Inhalation BID  . multivitamin  1 tablet Oral QHS  . sodium chloride flush  5 mL Intracatheter Q8H  . torsemide  40 mg Oral Daily   Continuous Infusions: . sodium chloride 250 mL (05/03/20 0043)  . anidulafungin Stopped (05/09/20 2247)  . meropenem (MERREM) IV Stopped (05/09/20 2323)     LOS: 26 days    Time spent: 20 mins     Wyvonnia Dusky, MD [facility] Pager [phone]  If 7PM-7AM, please contact night-coverage [email or web address] 05/10/2020, 7:58 AM

## 2020-05-10 NOTE — Progress Notes (Signed)
[facility]  ROUNDING NOTE   Subjective:   Patient awake, alert, conversant. Underwent dialysis treatment yesterday.  Objective:  Vital signs in last 24 hours:  Temp:  [98.2 F (36.8 C)-101.6 F (38.7 C)] 101.2 F (38.4 C) (09/11 1231) Pulse Rate:  [98-116] 115 (09/11 1231) Resp:  [14-20] 20 (09/11 1231) BP: (95-151)/(46-70) 115/65 (09/11 1231) SpO2:  [94 %-100 %] 97 % (09/11 1231)  Weight change:  Filed Weights   05/02/20 0358 05/03/20 0547 05/09/20 0951  Weight: 135.2 kg 130.9 kg 119 kg    Intake/Output: I/O last 3 completed shifts: In: 770 [P.O.:60; I.V.:20; Blood:330; IV Piggyback:360] Out: -114 [Drains:40]   Intake/Output this shift:  No intake/output data recorded.  Physical Exam: General: Awake, alert, in no acute distress  Lungs:  diminished at the bases  Heart: S1S2 Regular  Abdomen:  Obese, nontender  Extremities: + Dependent peripheral edema.  Neurologic: Awake, alert, conversant but confused  Skin Left inner thigh and left knee  with blisters  Access: RIJ permcath 8/27 Dr. [name]    Basic Metabolic Panel: Recent Labs  Lab 05/04/20 0543 05/04/20 0543 05/05/20 0831 05/05/20 0831 05/06/20 0701 05/06/20 0701 05/07/20 1531 05/07/20 1547 05/07/20 1547 05/08/20 1137 05/09/20 0930 05/10/20 0605  NA 136   < > 139   < > 139  --   --  140  --  139 143 141  K 3.7   < > 3.9   < > 4.1  --   --  3.6  --  3.7 3.6 3.7  CL 98   < > 101   < > 99  --   --  99  --  98 101 102  CO2 25   < > 26   < > 27  --   --  28  --  27 30 27   GLUCOSE 93   < > 103*   < > 101*  --   --  86  --  80 85 77  BUN 52*   < > 66*   < > 72*  --   --  60*  --  54* 60* 41*  CREATININE 4.15*   < > 4.66*   < > 4.96*  --   --  3.81*  --  3.87* 3.74* 2.96*  CALCIUM 7.6*   < > 7.5*   < > 7.7*   < >  --  7.5*   < > 7.3* 7.3* 7.6*  MG 1.8   < > 1.9   < > 2.1  --  2.2  --   --  1.8 2.1 2.1  PHOS 5.8*  --  6.4*  --  7.5*  --   --  5.1*  --   --   --   --    < > = values in this  interval not displayed.    Liver Function Tests: Recent Labs  Lab 05/04/20 0543 05/05/20 0831 05/06/20 0701 05/07/20 1547  ALBUMIN 1.3* 1.3* 1.3* 1.2*   No results for input(s): LIPASE, AMYLASE in the last 168 hours. No results for input(s): AMMONIA in the last 168 hours.  CBC: Recent Labs  Lab 05/04/20 0543 05/04/20 0543 05/05/20 0831 05/05/20 0831 05/06/20 0701 05/07/20 1547 05/08/20 1137 05/09/20 0930 05/10/20 0605  WBC 11.0*   < > 7.2   < > 5.4 3.4* 4.1 2.8* 2.7*  NEUTROABS 9.4*  --  5.9  --  4.1  --   --   --   --  HGB 6.7*   < > 6.8*   < > 7.8* 7.2* 8.0* 6.9* 8.5*  HCT 19.5*   < > 20.6*   < > 23.3* 21.8* 24.2* 20.4* 26.1*  MCV 89.9   < > 91.6   < > 89.6 92.4 93.1 89.1 91.3  PLT 162   < > 216   < > 274 302 341 380 383   < > = values in this interval not displayed.    Cardiac Enzymes: No results for input(s): CKTOTAL, CKMB, CKMBINDEX, TROPONINI in the last 168 hours.  BNP: Invalid input(s): POCBNP  CBG: No results for input(s): GLUCAP in the last 168 hours.  Microbiology: Results for orders placed or performed during the hospital encounter of 04/14/20  SARS Coronavirus 2 by RT PCR (hospital order, performed in [facility] hospital lab) Nasopharyngeal Nasopharyngeal Swab     Status: None   Collection Time: 04/14/20  2:17 AM   Specimen: Nasopharyngeal Swab  Result Value Ref Range Status   SARS Coronavirus 2 NEGATIVE NEGATIVE Final    Comment: (NOTE) SARS-CoV-2 target nucleic acids are NOT DETECTED.  The SARS-CoV-2 RNA is generally detectable in upper and lower respiratory specimens during the acute phase of infection. The lowest concentration of SARS-CoV-2 viral copies this assay can detect is 250 copies / mL. A negative result does not preclude SARS-CoV-2 infection and should not be used as the sole basis for treatment or other patient management decisions.  A negative result may occur with improper specimen collection / handling, submission of  specimen other than nasopharyngeal swab, presence of viral mutation(s) within the areas targeted by this assay, and inadequate number of viral copies (<250 copies / mL). A negative result must be combined with clinical observations, patient history, and epidemiological information.  Fact Sheet for Patients:   StrictlyIdeas.no  Fact Sheet for Healthcare Providers: BankingDealers.co.za  This test is not yet approved or  cleared by the [place] FDA and has been authorized for detection and/or diagnosis of SARS-CoV-2 by FDA under an Emergency Use Authorization (EUA).  This EUA will remain in effect (meaning this test can be used) for the duration of the COVID-19 declaration under Section 564(b)(1) of the Act, 21 U.S.C. section 360bbb-3(b)(1), unless the authorization is terminated or revoked sooner.  Performed at [facility], [address] [place], [place] [address]   CULTURE, BLOOD (ROUTINE X 2) w Reflex to ID Panel     Status: None   Collection Time: 04/14/20  8:14 AM   Specimen: BLOOD  Result Value Ref Range Status   Specimen Description BLOOD LEFT [facility]  Final   Special Requests   Final    BOTTLES DRAWN AEROBIC AND ANAEROBIC Blood Culture adequate volume   Culture   Final    NO GROWTH 5 DAYS Performed at [facility], [address] [place], [place] [address]    Report Status 04/19/2020 FINAL  Final  CULTURE, BLOOD (ROUTINE X 2) w Reflex to ID Panel     Status: None   Collection Time: 04/14/20  8:14 AM   Specimen: BLOOD  Result Value Ref Range Status   Specimen Description BLOOD LEFT WRIST  Final   Special Requests   Final    BOTTLES DRAWN AEROBIC AND ANAEROBIC Blood Culture adequate volume   Culture   Final    NO GROWTH 5 DAYS Performed at [facility], [address] [place], [place] [address]    Report Status 04/19/2020 FINAL  Final  Aerobic/Anaerobic  Culture (surgical/deep wound)      Status: None   Collection Time: 04/14/20 11:14 AM   Specimen: Abscess  Result Value Ref Range Status   Specimen Description   Final    ABSCESS Performed at [facility], [address], [place], [place] [address]    Special Requests   Final    NONE Performed at [facility], [place], [place] [address]    Gram Stain   Final    NO WBC SEEN ABUNDANT GRAM NEGATIVE RODS FEW GRAM POSITIVE COCCI FEW GRAM POSITIVE RODS Performed at [facility], [place] [address], [place], [place] [address]    Culture   Final    ABUNDANT KLEBSIELLA PNEUMONIAE ABUNDANT PSEUDOMONAS AERUGINOSA MODERATE ESCHERICHIA COLI Confirmed Extended Spectrum Beta-Lactamase Producer (ESBL).  In bloodstream infections from ESBL organisms, carbapenems are preferred over piperacillin/tazobactam. They are shown to have a lower risk of mortality. MIXED ANAEROBIC FLORA PRESENT.  CALL LAB IF FURTHER IID REQUIRED.    Report Status 04/20/2020 FINAL  Final   Organism ID, Bacteria KLEBSIELLA PNEUMONIAE  Final   Organism ID, Bacteria PSEUDOMONAS AERUGINOSA  Final   Organism ID, Bacteria ESCHERICHIA COLI  Final      Susceptibility   Escherichia coli - MIC*    AMPICILLIN >=32 RESISTANT Resistant     CEFAZOLIN >=64 RESISTANT Resistant     CEFEPIME 16 RESISTANT Resistant     CEFTAZIDIME RESISTANT Resistant     CEFTRIAXONE >=64 RESISTANT Resistant     CIPROFLOXACIN >=4 RESISTANT Resistant     GENTAMICIN <=1 SENSITIVE Sensitive     IMIPENEM <=0.25 SENSITIVE Sensitive     TRIMETH/SULFA >=320 RESISTANT Resistant     AMPICILLIN/SULBACTAM >=32 RESISTANT Resistant     PIP/TAZO 8 SENSITIVE Sensitive     * MODERATE ESCHERICHIA COLI   Klebsiella pneumoniae - MIC*    AMPICILLIN >=32 RESISTANT Resistant     CEFAZOLIN <=4 SENSITIVE Sensitive     CEFEPIME <=0.12 SENSITIVE Sensitive     CEFTAZIDIME <=1 SENSITIVE Sensitive     CEFTRIAXONE <=0.25 SENSITIVE Sensitive     CIPROFLOXACIN <=0.25 SENSITIVE  Sensitive     GENTAMICIN <=1 SENSITIVE Sensitive     IMIPENEM <=0.25 SENSITIVE Sensitive     TRIMETH/SULFA <=20 SENSITIVE Sensitive     AMPICILLIN/SULBACTAM 16 INTERMEDIATE Intermediate     PIP/TAZO 8 SENSITIVE Sensitive     * ABUNDANT KLEBSIELLA PNEUMONIAE   Pseudomonas aeruginosa - MIC*    CEFTAZIDIME 4 SENSITIVE Sensitive     CIPROFLOXACIN <=0.25 SENSITIVE Sensitive     GENTAMICIN 2 SENSITIVE Sensitive     IMIPENEM 2 SENSITIVE Sensitive     PIP/TAZO 8 SENSITIVE Sensitive     CEFEPIME 4 SENSITIVE Sensitive     * ABUNDANT PSEUDOMONAS AERUGINOSA  MRSA PCR Screening     Status: None   Collection Time: 04/14/20  6:19 PM   Specimen: Nasopharyngeal  Result Value Ref Range Status   MRSA by PCR NEGATIVE NEGATIVE Final    Comment:        The GeneXpert MRSA Assay (FDA approved for NASAL specimens only), is one component of a comprehensive MRSA colonization surveillance program. It is not intended to diagnose MRSA infection nor to guide or monitor treatment for MRSA infections. Performed at [facility], [address], [place], [name] [address]   Urine Culture     Status: None   Collection Time: 04/15/20  4:28 AM   Specimen: Urine, Random  Result Value Ref Range  Status   Specimen Description   Final    URINE, RANDOM Performed at [facility], [address], [place], [place] [address]    Special Requests   Final    NONE Performed at [facility], [address] [place], [place] [address]    Culture   Final    NO GROWTH Performed at [facility], [place] [address] [place], [place] [address]    Report Status 04/16/2020 FINAL  Final  CULTURE, BLOOD (ROUTINE X 2) w Reflex to ID Panel     Status: None   Collection Time: 04/22/20  6:12 PM   Specimen: BLOOD  Result Value Ref Range Status   Specimen Description BLOOD FOOT  Final   Special Requests   Final    BOTTLES DRAWN AEROBIC AND ANAEROBIC Blood Culture adequate volume   Culture   Final     NO GROWTH 5 DAYS Performed at [facility], [address] [place], [place] [address]    Report Status 04/27/2020 FINAL  Final  CULTURE, BLOOD (ROUTINE X 2) w Reflex to ID Panel     Status: None   Collection Time: 04/22/20  6:17 PM   Specimen: BLOOD  Result Value Ref Range Status   Specimen Description BLOOD FOOT  Final   Special Requests   Final    BOTTLES DRAWN AEROBIC AND ANAEROBIC Blood Culture results may not be optimal due to an excessive volume of blood received in culture bottles   Culture   Final    NO GROWTH 5 DAYS Performed at [facility], [address] [place], [place] [address]    Report Status 04/27/2020 FINAL  Final  CULTURE, BLOOD (ROUTINE X 2) w Reflex to ID Panel     Status: None   Collection Time: 05/01/20  2:50 PM   Specimen: BLOOD  Result Value Ref Range Status   Specimen Description BLOOD BLOOD LEFT HAND  Final   Special Requests   Final    BOTTLES DRAWN AEROBIC AND ANAEROBIC Blood Culture adequate volume   Culture   Final    NO GROWTH 5 DAYS Performed at [facility], [address] [place], [place] [address]    Report Status 05/06/2020 FINAL  Final  CULTURE, BLOOD (ROUTINE X 2) w Reflex to ID Panel     Status: None   Collection Time: 05/01/20  2:51 PM   Specimen: BLOOD  Result Value Ref Range Status   Specimen Description BLOOD BLOOD RIGHT HAND  Final   Special Requests   Final    BOTTLES DRAWN AEROBIC AND ANAEROBIC Blood Culture adequate volume   Culture   Final    NO GROWTH 5 DAYS Performed at [facility], [address] [place],  [address]    Report Status 05/06/2020 FINAL  Final    Coagulation Studies: No results for input(s): LABPROT, INR in the last 72 hours.  Urinalysis: No results for input(s): COLORURINE, LABSPEC, PHURINE, GLUCOSEU, HGBUR, BILIRUBINUR, KETONESUR, PROTEINUR, UROBILINOGEN, NITRITE, LEUKOCYTESUR in the last 72 hours.  Invalid input(s): APPERANCEUR    Imaging: No  results found.   Medications:   . sodium chloride 250 mL (05/03/20 0043)  . anidulafungin Stopped (05/09/20 2247)  . meropenem (MERREM) IV Stopped (05/09/20 2323)   . (feeding supplement) PROSource Plus  30 mL Oral TID BM  . acidophilus  1 capsule Oral Daily  . alteplase  2 mg Intracatheter Once  . alteplase  2 mg Intracatheter Once  .  amiodarone  200 mg Oral BID  . Chlorhexidine Gluconate Cloth  6 each Topical Daily  . docusate sodium  200 mg Oral BID  . rilpivirine  25 mg Oral Q breakfast   And  . dolutegravir  50 mg Oral Q breakfast  . epoetin (EPOGEN/PROCRIT) injection  10,000 Units Intravenous Q M,W,F-HD  . famotidine  10 mg Oral Q1200  . feeding supplement (NEPRO CARB STEADY)  237 mL Oral TID BM  . guaiFENesin-dextromethorphan  10 mL Oral Q8H  . heparin injection (subcutaneous)  5,000 Units Subcutaneous Q8H  . magnesium oxide  400 mg Oral Daily  . mometasone-formoterol  2 puff Inhalation BID  . multivitamin  1 tablet Oral QHS  . sodium chloride flush  5 mL Intracatheter Q8H  . torsemide  40 mg Oral Daily   acetaminophen **OR** acetaminophen, albuterol, ALPRAZolam, bisacodyl, diphenhydrAMINE, heparin NICU/SCN flush, HYDROmorphone (DILAUDID) injection, iohexol, ipratropium-albuterol, labetalol, magic mouthwash **AND** lidocaine, magnesium hydroxide, menthol-cetylpyridinium, oxyCODONE-acetaminophen, phenol, simethicone  Assessment/ Plan:  Melanie Bowen is a 59 y.o. black female with HIV, COPD, hypertension, GERD, who is admitted to [facility] on 04/14/2020 for Dyspnea [R06.00] Colonic diverticular abscess [K57.20] Diverticulitis of large intestine with abscess without bleeding [K57.20] Abscess of sigmoid colon due to diverticulitis [K57.20]  #Acute renal failure   No history of chronic kidney disease.   Requiring hemodialysis. First treatment on 8/18.  IV contrast exposure on 8/16. Ultrasound -negative for obstruction -patient underwent dialysis treatment yesterday.  We  will plan for hemodialysis again on Monday.  Lab Results  Component Value Date   CREATININE 2.96 (H) 05/10/2020   CREATININE 3.74 (H) 05/09/2020   CREATININE 3.87 (H) 05/08/2020   Outpatient chair at [facility] MWF schedule.   #. Anemia of chronic kidney disease:  Lab Results  Component Value Date   HGB 8.5 (L) 05/10/2020   -We will continue the patient on Epogen with dialysis treatments.Marland Kitchen   #Lower extremity edema Continue oral torsemide and ultrafiltration with dialysis treatments.  #Fever Receiving Anidulafungin IV,started on 05/03/20 Will continue monitoring for sepsis    LOS: 26 Okla Qazi 9/11/202112:36 PM

## 2020-05-10 NOTE — Progress Notes (Addendum)
PHARMACY NOTE:  ANTIMICROBIAL RENAL DOSAGE ADJUSTMENT  Current antimicrobial regimen includes a mismatch between antimicrobial dosage and estimated renal function.  As per policy approved by the Pharmacy & Therapeutics and Medical Executive Committees, the antimicrobial dosage will be adjusted accordingly.  Current antimicrobial dosage:  Meropenem 1g q12h  Indication: intra-abdominal infection  Renal Function:   Estimated Creatinine Clearance: 27.2 mL/min (A) (by C-G formula based on SCr of 2.96 mg/dL (H)). [x]      On intermittent HD, scheduled: []      On CRRT    Antimicrobial dosage has been changed to:  Meropenem 500mg  q24h   Additional comments:    Thank you for allowing pharmacy to be a part of this patient's care.  Lu Duffel, PharmD, BCPS Clinical Pharmacist 05/10/2020 1:27 PM

## 2020-05-10 NOTE — Progress Notes (Signed)
Physical Therapy Treatment Patient Details Name: Melanie Bowen MRN: [id number] DOB: 09-07-1960 Today's Date: 05/10/2020    History of Present Illness Melanie Bowen is a 2yoF who [facility] c LLQ ABD pain. Pt admitted c Acute sigmoid diverticulitis with diverticular abscess. PMH: COPD, asthma, HTN, diverticulosis, HIV, GERD, depression, bilat TKA, 3 level lumbar fusion, susequent lumbar hardware removal. Pt moved to ICU early in stay, inititated on HD on 04/16/20, temp cath removed 8/25. Pt  has had AF RVR issues. Pt also reports a subacute Rt rotator cuff injury with significant pain and disability, scheduled for repair with [name] in mid September.    PT Comments    On arrival to room, patient was in semi-fowlers position in bed with daughter in the room. She continues to be lethargic but is receptive to bed-level exercises. Since she has a fever, doctor ordered for just UE and LE exercises in the bed. Monitored HR and O2 throughout session due to fever and increased HR. Patient requires frequent verbal cues for proper technique and to slow speed of exercises. Hip abductions/adductions and UE pulls on arm rails used to help position her in the center of bed. Patient would stop exercising and not perform even with encouragement from daughter and SPTs. She closed her eyes twice during treatment, and after the second time, she notes feeling tired so ended session. She is very deconditioned as she needs help from SPT to achieve full ROM during LE exercises. Pt will benefit from continued skilled PT and will need rehab at discharge.   Follow Up Recommendations  SNF     Equipment Recommendations       Recommendations for Other Services       Precautions / Restrictions Precautions Precautions: Fall Restrictions Weight Bearing Restrictions: No    Mobility  Bed Mobility               General bed mobility comments: Did not perform due to doctors order to just perform  UE and LE exercises b/c of fever  Transfers                 General transfer comment: Did not perform due to doctors order to just perform UE and LE exercises b/c of fever  Ambulation/Gait             General Gait Details: Did not perform due to doctors order to just perform UE and LE exercises b/c of fever   Stairs             Wheelchair Mobility    Modified Rankin (Stroke Patients Only)       Balance       Sitting balance - Comments: Did not perform due to doctors order to just perform UE and LE exercises b/c of fever                                    Cognition Arousal/Alertness: Lethargic Behavior During Therapy: Flat affect Overall Cognitive Status: Within Functional Limits for tasks assessed                                 General Comments: Pt closes her eyes during treatment session and will stop exercising even with encouragement from daughter      Exercises General Exercises - Lower Extremity Ankle Circles/Pumps: AROM;Both;10 reps;Supine  Hip ABduction/ADduction: AROM;Supine;Both;5 reps Straight Leg Raises: AROM;Both;10 reps;Supine Other Exercises Other Exercises: Scap squeezes and shoulder rolls x 10 reps each, patient cued to slow down movements    General Comments        Pertinent Vitals/Pain Pain Assessment: No/denies pain Pain Score: 0-No pain    Home Living                      Prior Function            PT Goals (current goals can now be found in the care plan section) Acute Rehab PT Goals Patient Stated Goal: go home PT Goal Formulation: With patient Time For Goal Achievement: 05/24/20 Potential to Achieve Goals: Poor Progress towards PT goals: Progressing toward goals    Frequency    Min 2X/week      PT Plan Current plan remains appropriate    Co-evaluation              AM-PAC PT "6 Clicks" Mobility   Outcome Measure  Help needed turning from your back to your  side while in a flat bed without using bedrails?: A Lot Help needed moving from lying on your back to sitting on the side of a flat bed without using bedrails?: A Lot Help needed moving to and from a bed to a chair (including a wheelchair)?: Total Help needed standing up from a chair using your arms (e.g., wheelchair or bedside chair)?: Total Help needed to walk in hospital room?: Total Help needed climbing 3-5 steps with a railing? : Total 6 Click Score: 8    End of Session   Activity Tolerance: Patient limited by fatigue Patient left: in bed;with call bell/phone within reach;with bed alarm set Nurse Communication: Mobility status PT Visit Diagnosis: Unsteadiness on feet (R26.81);Other abnormalities of gait and mobility (R26.89);Muscle weakness (generalized) (M62.81);Difficulty in walking, not elsewhere classified (R26.2)     Time: 1324-4010 PT Time Calculation (min) (ACUTE ONLY): 14 min  Charges:                        Noemi Chapel, SPT Bernita Raisin 05/10/2020, 2:47 PM

## 2020-05-10 NOTE — Plan of Care (Signed)
Continuing with plan of care. 

## 2020-05-10 NOTE — Progress Notes (Signed)
The patient has an elevated temperature and heart rate with MEWS that continue in the yellow range.  The patient has been offered Tylenol and her scheduled medications of which she has refused all.  Dr. Jimmye Norman updated, will continue to monitor the patient.

## 2020-05-11 LAB — TYPE AND SCREEN
ABO/RH(D): O POS
Antibody Screen: NEGATIVE
Unit division: 0

## 2020-05-11 LAB — BASIC METABOLIC PANEL
Anion gap: 11 (ref 5–15)
BUN: 45 mg/dL — ABNORMAL HIGH (ref 6–20)
CO2: 28 mmol/L (ref 22–32)
Calcium: 7.5 mg/dL — ABNORMAL LOW (ref 8.9–10.3)
Chloride: 102 mmol/L (ref 98–111)
Creatinine, Ser: 3.45 mg/dL — ABNORMAL HIGH (ref 0.44–1.00)
GFR calc Af Amer: 16 mL/min — ABNORMAL LOW (ref >60)
GFR calc non Af Amer: 14 mL/min — ABNORMAL LOW (ref >60)
Glucose, Bld: 70 mg/dL (ref 70–99)
Potassium: 3.7 mmol/L (ref 3.5–5.1)
Sodium: 141 mmol/L (ref 135–145)

## 2020-05-11 LAB — CBC
HCT: 23.2 % — ABNORMAL LOW (ref 36.0–46.0)
Hemoglobin: 7.5 g/dL — ABNORMAL LOW (ref 12.0–15.0)
MCH: 29.2 pg (ref 26.0–34.0)
MCHC: 32.3 g/dL (ref 30.0–36.0)
MCV: 90.3 fL (ref 80.0–100.0)
Platelets: 405 10*3/uL — ABNORMAL HIGH (ref 150–400)
RBC: 2.57 MIL/uL — ABNORMAL LOW (ref 3.87–5.11)
RDW: 18.4 % — ABNORMAL HIGH (ref 11.5–15.5)
WBC: 2.9 10*3/uL — ABNORMAL LOW (ref 4.0–10.5)
nRBC: 2.1 % — ABNORMAL HIGH (ref 0.0–0.2)

## 2020-05-11 LAB — BPAM RBC
Blood Product Expiration Date: 202110112359
ISSUE DATE / TIME: 202109101703
Unit Type and Rh: 5100

## 2020-05-11 LAB — MAGNESIUM: Magnesium: 2 mg/dL (ref 1.7–2.4)

## 2020-05-11 MED ORDER — METOPROLOL TARTRATE 5 MG/5ML IV SOLN
5.0000 mg | Freq: Once | INTRAVENOUS | Status: AC
  Administered 2020-05-11: 5 mg via INTRAVENOUS

## 2020-05-11 MED ORDER — SODIUM CHLORIDE 0.9 % IV SOLN
INTRAVENOUS | Status: DC | PRN
  Administered 2020-05-11 – 2020-05-12 (×2): 250 mL via INTRAVENOUS

## 2020-05-11 MED ORDER — METOPROLOL TARTRATE 5 MG/5ML IV SOLN
INTRAVENOUS | Status: AC
  Filled 2020-05-11: qty 5

## 2020-05-11 NOTE — Progress Notes (Signed)
PROGRESS NOTE    Melanie Bowen  BSJ:[id number] DOB: 09-03-1960 DOA: 04/14/2020 PCP: Theotis Burrow, MD  Assessment & Plan:   Principal Problem:   Severe sepsis with septic shock ([place]) Active Problems:   HTN (hypertension)   HIV (human immunodeficiency virus infection) ([place])   Diverticulitis of large intestine with abscess without bleeding   Abscess of sigmoid colon due to diverticulitis   Acute renal failure (ARF) ([place])   Hypotension   Diverticulosis   Acute respiratory distress   DNR (do not resuscitate) discussion   Palliative care by specialist   Dyspnea   Goals of care, counseling/discussion   Pressure injury of skin   Non-compliance: pt continues to refuse to take medications daily. Pt finally participated in therapy yesterday w/ pt's daughter present at bedside. PT still recs SNF. Pt's daughter [name], is aware of the pt's non-compliance. Psych saw pt but the pt refused to take any anti-depressant or anti-anxiety meds. Palliative care saw the pt and will continue w/ current treatments but no BiPAP or intubation.  Septic shock:  resolved  Acute sigmoid diverticulitis and diverticular abscess: s/p CT-guided drainage by IR on 04/14/2020.  Fluid culture showed Klebsiella pneumonia, pseudomonas aeruginosa and E. Coli. Completed IV merrem course but restarted on 05/09/20 for a fever as per ID. Continue on IV anidulafungin & will switch to po fluconazole at d/c as per ID  AKI: continue on HD as per nephro. Has an outpatient HD spot set up  A. fib: continue on amiodarone. Will consider long-term anticoagulation in the future  Acute normocytic anemia: s/p 3 units of pRBCs. H&H are trending back down again today, labile. Will continue to monitor   COPD exacerbation: resolved. Continue on bronchodilators & incentive spirometry   HIV infection: continue on rilpivirine, dolutegravir as per ID   Thrombocytosis: etiology unclear. Will continue to monitor   Acute  liver failure: resolved  Leukocytosis: resolved  Acute hypoxemic respiratory failure: respiratory status is labile. Continue on supplemental oxygen and wean as tolerated  Acute metabolic encephalopathy/delirium: labile. No acute abnormality on CT head. Re-orient prn   History of stroke: not on statin, aspirin or plavix at home.   DVT prophylaxis: heparin  Code Status: full  Family Communication:  Disposition Plan:  Likely d/c to SNF  Status is: Inpatient  Remains inpatient appropriate because:IV treatments appropriate due to intensity of illness or inability to take PO, continues to refuse meds but did work with therapy yesterday w/ pt's daughter present at bedside    Dispo: The patient is from: Home              Anticipated d/c is to: SNF              Anticipated d/c date is: > 3 days              Patient currently is not medically stable to d/c.     Consultants:   nephro   gen surg    Vascular surg   ID   Procedures:   Antimicrobials: merrem   Subjective: Pt is lethargic again today but asking for ice   Objective: Vitals:   05/10/20 0518 05/10/20 1231 05/10/20 1948 05/11/20 0543  BP:  115/65 (!) 110/56 95/62  Pulse:  (!) 115 (!) 103 (!) 106  Resp:  20 19 16   Temp: 98.5 F (36.9 C) (!) 101.2 F (38.4 C) 98.4 F (36.9 C) 99.5 F (37.5 C)  TempSrc: Oral Oral Oral Oral  SpO2:  97% 98% 98%  Weight:      Height:        Intake/Output Summary (Last 24 hours) at 05/11/2020 0742 Last data filed at 05/11/2020 0554 Gross per 24 hour  Intake 490 ml  Output 400 ml  Net 90 ml   Filed Weights   05/02/20 0358 05/03/20 0547 05/09/20 0951  Weight: 135.2 kg 130.9 kg 119 kg    Examination:  General exam:Appears lethargic Respiratory system:decreased breath sounds b/l  Cardiovascular system:irregularly irregular. No rubs, gallops or clicks.  Gastrointestinal system:Abdomen is obese, soft and nontender. Hypoactive bowel sounds  Central nervous  system:Still lethargic. Does not follow simple commands  Psychiatry:Judgement and insight appear abnormal. Flat mood and affect     Data Reviewed: I have personally reviewed following labs and imaging studies  CBC: Recent Labs  Lab 05/05/20 0831 05/05/20 0831 05/06/20 0701 05/06/20 0701 05/07/20 1547 05/08/20 1137 05/09/20 0930 05/10/20 0605 05/11/20 0708  WBC 7.2   < > 5.4   < > 3.4* 4.1 2.8* 2.7* 2.9*  NEUTROABS 5.9  --  4.1  --   --   --   --   --   --   HGB 6.8*   < > 7.8*   < > 7.2* 8.0* 6.9* 8.5* 7.5*  HCT 20.6*   < > 23.3*   < > 21.8* 24.2* 20.4* 26.1* 23.2*  MCV 91.6   < > 89.6   < > 92.4 93.1 89.1 91.3 90.3  PLT 216   < > 274   < > 302 341 380 383 405*   < > = values in this interval not displayed.   Basic Metabolic Panel: Recent Labs  Lab 05/05/20 0831 05/05/20 0831 05/06/20 0701 05/06/20 0701 05/07/20 1531 05/07/20 1547 05/08/20 1137 05/09/20 0930 05/10/20 0605 05/11/20 0708  NA 139   < > 139   < >  --  140 139 143 141 141  K 3.9   < > 4.1   < >  --  3.6 3.7 3.6 3.7 3.7  CL 101   < > 99   < >  --  99 98 101 102 102  CO2 26   < > 27   < >  --  28 27 30 27 28   GLUCOSE 103*   < > 101*   < >  --  86 80 85 77 70  BUN 66*   < > 72*   < >  --  60* 54* 60* 41* 45*  CREATININE 4.66*   < > 4.96*   < >  --  3.81* 3.87* 3.74* 2.96* 3.45*  CALCIUM 7.5*   < > 7.7*   < >  --  7.5* 7.3* 7.3* 7.6* 7.5*  MG 1.9   < > 2.1   < > 2.2  --  1.8 2.1 2.1 2.0  PHOS 6.4*  --  7.5*  --   --  5.1*  --   --   --   --    < > = values in this interval not displayed.   GFR: Estimated Creatinine Clearance: 23.3 mL/min (A) (by C-G formula based on SCr of 3.45 mg/dL (H)). Liver Function Tests: Recent Labs  Lab 05/05/20 0831 05/06/20 0701 05/07/20 1547  ALBUMIN 1.3* 1.3* 1.2*   No results for input(s): LIPASE, AMYLASE in the last 168 hours. No results for input(s): AMMONIA in the last 168 hours. Coagulation Profile: No results for input(s): INR, PROTIME in the last 168  hours. Cardiac Enzymes: No results for input(s): CKTOTAL, CKMB, CKMBINDEX, TROPONINI in the last 168 hours. BNP (last 3 results) No results for input(s): PROBNP in the last 8760 hours. HbA1C: No results for input(s): HGBA1C in the last 72 hours. CBG: No results for input(s): GLUCAP in the last 168 hours. Lipid Profile: No results for input(s): CHOL, HDL, LDLCALC, TRIG, CHOLHDL, LDLDIRECT in the last 72 hours. Thyroid Function Tests: No results for input(s): TSH, T4TOTAL, FREET4, T3FREE, THYROIDAB in the last 72 hours. Anemia Panel: No results for input(s): VITAMINB12, FOLATE, FERRITIN, TIBC, IRON, RETICCTPCT in the last 72 hours. Sepsis Labs: No results for input(s): PROCALCITON, LATICACIDVEN in the last 168 hours.  Recent Results (from the past 240 hour(s))  CULTURE, BLOOD (ROUTINE X 2) w Reflex to ID Panel     Status: None   Collection Time: 05/01/20  2:50 PM   Specimen: BLOOD  Result Value Ref Range Status   Specimen Description BLOOD BLOOD LEFT HAND  Final   Special Requests   Final    BOTTLES DRAWN AEROBIC AND ANAEROBIC Blood Culture adequate volume   Culture   Final    NO GROWTH 5 DAYS Performed at [facility], [address] [place], [place] [address]    Report Status 05/06/2020 FINAL  Final  CULTURE, BLOOD (ROUTINE X 2) w Reflex to ID Panel     Status: None   Collection Time: 05/01/20  2:51 PM   Specimen: BLOOD  Result Value Ref Range Status   Specimen Description BLOOD BLOOD RIGHT HAND  Final   Special Requests   Final    BOTTLES DRAWN AEROBIC AND ANAEROBIC Blood Culture adequate volume   Culture   Final    NO GROWTH 5 DAYS Performed at [facility], [address], [place], [place] [address]    Report Status 05/06/2020 FINAL  Final         Radiology Studies: No results found.      Scheduled Meds: . (feeding supplement) PROSource Plus  30 mL Oral TID BM  . acidophilus  1 capsule Oral Daily  . alteplase  2 mg Intracatheter Once   . alteplase  2 mg Intracatheter Once  . amiodarone  200 mg Oral BID  . Chlorhexidine Gluconate Cloth  6 each Topical Daily  . docusate sodium  200 mg Oral BID  . rilpivirine  25 mg Oral Q breakfast   And  . dolutegravir  50 mg Oral Q breakfast  . dronabinol  5 mg Oral BID AC  . epoetin (EPOGEN/PROCRIT) injection  10,000 Units Intravenous Q M,W,F-HD  . famotidine  10 mg Oral Q1200  . feeding supplement (NEPRO CARB STEADY)  237 mL Oral TID BM  . guaiFENesin-dextromethorphan  10 mL Oral Q8H  . heparin injection (subcutaneous)  5,000 Units Subcutaneous Q8H  . magnesium oxide  400 mg Oral Daily  . metoprolol tartrate      . mometasone-formoterol  2 puff Inhalation BID  . multivitamin  1 tablet Oral QHS  . sodium chloride flush  5 mL Intracatheter Q8H  . torsemide  40 mg Oral Daily   Continuous Infusions: . sodium chloride 250 mL (05/03/20 0043)  . anidulafungin Stopped (05/11/20 0152)  . meropenem (MERREM) IV Stopped (05/11/20 0002)     LOS: 27 days    Time spent: 20 mins     Wyvonnia Dusky, MD [facility] Pager [phone]  If 7PM-7AM, please contact night-coverage [email or web address] 05/11/2020, 7:42 AM

## 2020-05-11 NOTE — Progress Notes (Signed)
ID Spoke to her daughter [name] yesterday Pt has been refusing meds including HIV meds and not eating Pt has fever She has secretions lungs which are thick and she needs incentive spirometry, chest PT and frequent suction or coughing up Continue meropenem and anidulafungin Need to have family meeting with palliative care and patient  tostablish treatment goals If pt is amenable for full, aggressive care she then needs CT abdomen, peritoneal fluid to be aspirated for culture, sputum culture and blood culture. Discussed yesterday with her nurse

## 2020-05-11 NOTE — Progress Notes (Signed)
[facility]  ROUNDING NOTE   Subjective:   Patient arousable today but has difficulty focusing on questions posed. Likely due for dialysis again tomorrow.  Objective:  Vital signs in last 24 hours:  Temp:  [98.4 F (36.9 C)-99.5 F (37.5 C)] 99.5 F (37.5 C) (09/12 0543) Pulse Rate:  [103-106] 106 (09/12 0543) Resp:  [16-19] 16 (09/12 0543) BP: (95-110)/(56-62) 95/62 (09/12 0543) SpO2:  [98 %] 98 % (09/12 0543)  Weight change:  Filed Weights   05/02/20 0358 05/03/20 0547 05/09/20 0951  Weight: 135.2 kg 130.9 kg 119 kg    Intake/Output: I/O last 3 completed shifts: In: 1050 [P.O.:60; Blood:330; IV Piggyback:660] Out: 400 [Urine:400]   Intake/Output this shift:  No intake/output data recorded.  Physical Exam: General: Awake, alert, in no acute distress  Lungs:  diminished at the bases  Heart: S1S2 Regular  Abdomen:  Obese, nontender  Extremities: +Dependent peripheral edema.  Neurologic: Awake, alert, conversant but confused  Skin Left inner thigh and left knee  with blisters  Access: RIJ permcath 8/27 Dr. [name]    Basic Metabolic Panel: Recent Labs  Lab 05/05/20 0831 05/05/20 0831 05/06/20 0701 05/06/20 0701 05/07/20 1531 05/07/20 1547 05/07/20 1547 05/08/20 1137 05/08/20 1137 05/09/20 0930 05/10/20 0605 05/11/20 0708  NA 139   < > 139   < >  --  140  --  139  --  143 141 141  K 3.9   < > 4.1   < >  --  3.6  --  3.7  --  3.6 3.7 3.7  CL 101   < > 99   < >  --  99  --  98  --  101 102 102  CO2 26   < > 27   < >  --  28  --  27  --  30 27 28   GLUCOSE 103*   < > 101*   < >  --  86  --  80  --  85 77 70  BUN 66*   < > 72*   < >  --  60*  --  54*  --  60* 41* 45*  CREATININE 4.66*   < > 4.96*   < >  --  3.81*  --  3.87*  --  3.74* 2.96* 3.45*  CALCIUM 7.5*   < > 7.7*   < >  --  7.5*   < > 7.3*   < > 7.3* 7.6* 7.5*  MG 1.9   < > 2.1   < > 2.2  --   --  1.8  --  2.1 2.1 2.0  PHOS 6.4*  --  7.5*  --   --  5.1*  --   --   --   --   --   --    < >  = values in this interval not displayed.    Liver Function Tests: Recent Labs  Lab 05/05/20 0831 05/06/20 0701 05/07/20 1547  ALBUMIN 1.3* 1.3* 1.2*   No results for input(s): LIPASE, AMYLASE in the last 168 hours. No results for input(s): AMMONIA in the last 168 hours.  CBC: Recent Labs  Lab 05/05/20 0831 05/05/20 0831 05/06/20 0701 05/06/20 0701 05/07/20 1547 05/08/20 1137 05/09/20 0930 05/10/20 0605 05/11/20 0708  WBC 7.2   < > 5.4   < > 3.4* 4.1 2.8* 2.7* 2.9*  NEUTROABS 5.9  --  4.1  --   --   --   --   --   --  HGB 6.8*   < > 7.8*   < > 7.2* 8.0* 6.9* 8.5* 7.5*  HCT 20.6*   < > 23.3*   < > 21.8* 24.2* 20.4* 26.1* 23.2*  MCV 91.6   < > 89.6   < > 92.4 93.1 89.1 91.3 90.3  PLT 216   < > 274   < > 302 341 380 383 405*   < > = values in this interval not displayed.    Cardiac Enzymes: No results for input(s): CKTOTAL, CKMB, CKMBINDEX, TROPONINI in the last 168 hours.  BNP: Invalid input(s): POCBNP  CBG: No results for input(s): GLUCAP in the last 168 hours.  Microbiology: Results for orders placed or performed during the hospital encounter of 04/14/20  SARS Coronavirus 2 by RT PCR (hospital order, performed in [facility] hospital lab) Nasopharyngeal Nasopharyngeal Swab     Status: None   Collection Time: 04/14/20  2:17 AM   Specimen: Nasopharyngeal Swab  Result Value Ref Range Status   SARS Coronavirus 2 NEGATIVE NEGATIVE Final    Comment: (NOTE) SARS-CoV-2 target nucleic acids are NOT DETECTED.  The SARS-CoV-2 RNA is generally detectable in upper and lower respiratory specimens during the acute phase of infection. The lowest concentration of SARS-CoV-2 viral copies this assay can detect is 250 copies / mL. A negative result does not preclude SARS-CoV-2 infection and should not be used as the sole basis for treatment or other patient management decisions.  A negative result may occur with improper specimen collection / handling, submission of specimen  other than nasopharyngeal swab, presence of viral mutation(s) within the areas targeted by this assay, and inadequate number of viral copies (<250 copies / mL). A negative result must be combined with clinical observations, patient history, and epidemiological information.  Fact Sheet for Patients:   StrictlyIdeas.no  Fact Sheet for Healthcare Providers: BankingDealers.co.za  This test is not yet approved or  cleared by the [place] FDA and has been authorized for detection and/or diagnosis of SARS-CoV-2 by FDA under an Emergency Use Authorization (EUA).  This EUA will remain in effect (meaning this test can be used) for the duration of the COVID-19 declaration under Section 564(b)(1) of the Act, 21 U.S.C. section 360bbb-3(b)(1), unless the authorization is terminated or revoked sooner.  Performed at [facility], [address] [place], [place] [address]   CULTURE, BLOOD (ROUTINE X 2) w Reflex to ID Panel     Status: None   Collection Time: 04/14/20  8:14 AM   Specimen: BLOOD  Result Value Ref Range Status   Specimen Description BLOOD LEFT [facility]  Final   Special Requests   Final    BOTTLES DRAWN AEROBIC AND ANAEROBIC Blood Culture adequate volume   Culture   Final    NO GROWTH 5 DAYS Performed at [facility], [address] [place], [place] [address]    Report Status 04/19/2020 FINAL  Final  CULTURE, BLOOD (ROUTINE X 2) w Reflex to ID Panel     Status: None   Collection Time: 04/14/20  8:14 AM   Specimen: BLOOD  Result Value Ref Range Status   Specimen Description BLOOD LEFT WRIST  Final   Special Requests   Final    BOTTLES DRAWN AEROBIC AND ANAEROBIC Blood Culture adequate volume   Culture   Final    NO GROWTH 5 DAYS Performed at [facility], [address], [place],  [address]    Report Status 04/19/2020 FINAL  Final  Aerobic/Anaerobic  Culture (surgical/deep wound)     Status:  None   Collection Time: 04/14/20 11:14 AM   Specimen: Abscess  Result Value Ref Range Status   Specimen Description   Final    ABSCESS Performed at [facility], [address] [place], [place] [address]    Special Requests   Final    NONE Performed at [facility], [place], [place] [address]    Gram Stain   Final    NO WBC SEEN ABUNDANT GRAM NEGATIVE RODS FEW GRAM POSITIVE COCCI FEW GRAM POSITIVE RODS Performed at [facility], [place] [address], [place], [place] [address]    Culture   Final    ABUNDANT KLEBSIELLA PNEUMONIAE ABUNDANT PSEUDOMONAS AERUGINOSA MODERATE ESCHERICHIA COLI Confirmed Extended Spectrum Beta-Lactamase Producer (ESBL).  In bloodstream infections from ESBL organisms, carbapenems are preferred over piperacillin/tazobactam. They are shown to have a lower risk of mortality. MIXED ANAEROBIC FLORA PRESENT.  CALL LAB IF FURTHER IID REQUIRED.    Report Status 04/20/2020 FINAL  Final   Organism ID, Bacteria KLEBSIELLA PNEUMONIAE  Final   Organism ID, Bacteria PSEUDOMONAS AERUGINOSA  Final   Organism ID, Bacteria ESCHERICHIA COLI  Final      Susceptibility   Escherichia coli - MIC*    AMPICILLIN >=32 RESISTANT Resistant     CEFAZOLIN >=64 RESISTANT Resistant     CEFEPIME 16 RESISTANT Resistant     CEFTAZIDIME RESISTANT Resistant     CEFTRIAXONE >=64 RESISTANT Resistant     CIPROFLOXACIN >=4 RESISTANT Resistant     GENTAMICIN <=1 SENSITIVE Sensitive     IMIPENEM <=0.25 SENSITIVE Sensitive     TRIMETH/SULFA >=320 RESISTANT Resistant     AMPICILLIN/SULBACTAM >=32 RESISTANT Resistant     PIP/TAZO 8 SENSITIVE Sensitive     * MODERATE ESCHERICHIA COLI   Klebsiella pneumoniae - MIC*    AMPICILLIN >=32 RESISTANT Resistant     CEFAZOLIN <=4 SENSITIVE Sensitive     CEFEPIME <=0.12 SENSITIVE Sensitive     CEFTAZIDIME <=1 SENSITIVE Sensitive     CEFTRIAXONE <=0.25 SENSITIVE Sensitive     CIPROFLOXACIN <=0.25 SENSITIVE  Sensitive     GENTAMICIN <=1 SENSITIVE Sensitive     IMIPENEM <=0.25 SENSITIVE Sensitive     TRIMETH/SULFA <=20 SENSITIVE Sensitive     AMPICILLIN/SULBACTAM 16 INTERMEDIATE Intermediate     PIP/TAZO 8 SENSITIVE Sensitive     * ABUNDANT KLEBSIELLA PNEUMONIAE   Pseudomonas aeruginosa - MIC*    CEFTAZIDIME 4 SENSITIVE Sensitive     CIPROFLOXACIN <=0.25 SENSITIVE Sensitive     GENTAMICIN 2 SENSITIVE Sensitive     IMIPENEM 2 SENSITIVE Sensitive     PIP/TAZO 8 SENSITIVE Sensitive     CEFEPIME 4 SENSITIVE Sensitive     * ABUNDANT PSEUDOMONAS AERUGINOSA  MRSA PCR Screening     Status: None   Collection Time: 04/14/20  6:19 PM   Specimen: Nasopharyngeal  Result Value Ref Range Status   MRSA by PCR NEGATIVE NEGATIVE Final    Comment:        The GeneXpert MRSA Assay (FDA approved for NASAL specimens only), is one component of a comprehensive MRSA colonization surveillance program. It is not intended to diagnose MRSA infection nor to guide or monitor treatment for MRSA infections. Performed at [facility], [address] [place],  [address]   Urine Culture     Status: None   Collection Time: 04/15/20  4:28 AM   Specimen: Urine, Random  Result Value Ref Range  Status   Specimen Description   Final    URINE, RANDOM Performed at [facility], [address] [place], [place] [address]    Special Requests   Final    NONE Performed at [facility], [address] [place], [place] [address]    Culture   Final    NO GROWTH Performed at [facility], [place] [address], [place], [place] [address]    Report Status 04/16/2020 FINAL  Final  CULTURE, BLOOD (ROUTINE X 2) w Reflex to ID Panel     Status: None   Collection Time: 04/22/20  6:12 PM   Specimen: BLOOD  Result Value Ref Range Status   Specimen Description BLOOD FOOT  Final   Special Requests   Final    BOTTLES DRAWN AEROBIC AND ANAEROBIC Blood Culture adequate volume   Culture   Final     NO GROWTH 5 DAYS Performed at [facility], [address] [place], [place] [address]    Report Status 04/27/2020 FINAL  Final  CULTURE, BLOOD (ROUTINE X 2) w Reflex to ID Panel     Status: None   Collection Time: 04/22/20  6:17 PM   Specimen: BLOOD  Result Value Ref Range Status   Specimen Description BLOOD FOOT  Final   Special Requests   Final    BOTTLES DRAWN AEROBIC AND ANAEROBIC Blood Culture results may not be optimal due to an excessive volume of blood received in culture bottles   Culture   Final    NO GROWTH 5 DAYS Performed at [facility], [address] [place], [place] [address]    Report Status 04/27/2020 FINAL  Final  CULTURE, BLOOD (ROUTINE X 2) w Reflex to ID Panel     Status: None   Collection Time: 05/01/20  2:50 PM   Specimen: BLOOD  Result Value Ref Range Status   Specimen Description BLOOD BLOOD LEFT HAND  Final   Special Requests   Final    BOTTLES DRAWN AEROBIC AND ANAEROBIC Blood Culture adequate volume   Culture   Final    NO GROWTH 5 DAYS Performed at [facility], [address] [place], [place] [address]    Report Status 05/06/2020 FINAL  Final  CULTURE, BLOOD (ROUTINE X 2) w Reflex to ID Panel     Status: None   Collection Time: 05/01/20  2:51 PM   Specimen: BLOOD  Result Value Ref Range Status   Specimen Description BLOOD BLOOD RIGHT HAND  Final   Special Requests   Final    BOTTLES DRAWN AEROBIC AND ANAEROBIC Blood Culture adequate volume   Culture   Final    NO GROWTH 5 DAYS Performed at [facility], [address] [place], [place] [address]    Report Status 05/06/2020 FINAL  Final    Coagulation Studies: No results for input(s): LABPROT, INR in the last 72 hours.  Urinalysis: No results for input(s): COLORURINE, LABSPEC, PHURINE, GLUCOSEU, HGBUR, BILIRUBINUR, KETONESUR, PROTEINUR, UROBILINOGEN, NITRITE, LEUKOCYTESUR in the last 72 hours.  Invalid input(s): APPERANCEUR    Imaging: No  results found.   Medications:   . sodium chloride 250 mL (05/03/20 0043)  . anidulafungin Stopped (05/11/20 0152)  . meropenem (MERREM) IV Stopped (05/11/20 0002)   . (feeding supplement) PROSource Plus  30 mL Oral TID BM  . acidophilus  1 capsule Oral Daily  . alteplase  2 mg Intracatheter Once  . alteplase  2 mg Intracatheter Once  .  amiodarone  200 mg Oral BID  . Chlorhexidine Gluconate Cloth  6 each Topical Daily  . docusate sodium  200 mg Oral BID  . rilpivirine  25 mg Oral Q breakfast   And  . dolutegravir  50 mg Oral Q breakfast  . dronabinol  5 mg Oral BID AC  . epoetin (EPOGEN/PROCRIT) injection  10,000 Units Intravenous Q M,W,F-HD  . famotidine  10 mg Oral Q1200  . feeding supplement (NEPRO CARB STEADY)  237 mL Oral TID BM  . guaiFENesin-dextromethorphan  10 mL Oral Q8H  . heparin injection (subcutaneous)  5,000 Units Subcutaneous Q8H  . magnesium oxide  400 mg Oral Daily  . mometasone-formoterol  2 puff Inhalation BID  . multivitamin  1 tablet Oral QHS  . sodium chloride flush  5 mL Intracatheter Q8H  . torsemide  40 mg Oral Daily   acetaminophen **OR** acetaminophen, albuterol, ALPRAZolam, bisacodyl, diphenhydrAMINE, heparin NICU/SCN flush, HYDROmorphone (DILAUDID) injection, iohexol, ipratropium-albuterol, labetalol, magic mouthwash **AND** lidocaine, magnesium hydroxide, menthol-cetylpyridinium, oxyCODONE-acetaminophen, phenol, simethicone  Assessment/ Plan:  [name] is a 59 y.o. black female with HIV, COPD, hypertension, GERD, who is admitted to [facility]14[facility]2020 for Dyspnea [R06.00] Colonic diverticular abscess [K57.20] Diverticulitis of large intestine with abscess without bleeding [K57.20] Abscess of sigmoid colon due to diverticulitis [K57.20]  #Acute renal failure   No history of chronic kidney disease.   Requiring hemodialysis. First treatment on 8/18.  IV contrast exposure on 8/16. Ultrasound -negative for obstruction -we will plan for  hemodialysis again tomorrow.  Orders to be prepared.  Lab Results  Component Value Date   CREATININE 3.45 (H) 05/11/2020   CREATININE 2.96 (H) 05/10/2020   CREATININE 3.74 (H) 05/09/2020   Outpatient chair at [facility] MWF schedule.   #. Anemia of chronic kidney disease:  Lab Results  Component Value Date   HGB 7.5 (L) 05/11/2020   -Continue Epogen with dialysis treatments.  #Lower extremity edema Continue oral torsemide and ultrafiltration with dialysis treatments.  #Fever Receiving Anidulafungin and meropenem IV,started on 05/03/20     LOS: 27 Ghassan Coggeshall 9/12/20213:01 PM

## 2020-05-11 NOTE — Plan of Care (Signed)
Continuing with plan of care. 

## 2020-05-12 ENCOUNTER — Inpatient Hospital Stay: Payer: Medicare Other

## 2020-05-12 ENCOUNTER — Ambulatory Visit: Admission: RE | Admit: 2020-05-12 | Payer: Medicare Other | Source: Home / Self Care | Admitting: Orthopedic Surgery

## 2020-05-12 ENCOUNTER — Encounter: Admission: RE | Payer: Self-pay | Source: Home / Self Care

## 2020-05-12 DIAGNOSIS — A419 Sepsis, unspecified organism: Secondary | ICD-10-CM | POA: Diagnosis not present

## 2020-05-12 DIAGNOSIS — Z5329 Procedure and treatment not carried out because of patient's decision for other reasons: Secondary | ICD-10-CM

## 2020-05-12 DIAGNOSIS — R6521 Severe sepsis with septic shock: Secondary | ICD-10-CM | POA: Diagnosis not present

## 2020-05-12 DIAGNOSIS — K5720 Diverticulitis of large intestine with perforation and abscess without bleeding: Secondary | ICD-10-CM | POA: Diagnosis not present

## 2020-05-12 DIAGNOSIS — B20 Human immunodeficiency virus [HIV] disease: Secondary | ICD-10-CM | POA: Diagnosis not present

## 2020-05-12 LAB — CBC
HCT: 25.1 % — ABNORMAL LOW (ref 36.0–46.0)
Hemoglobin: 8 g/dL — ABNORMAL LOW (ref 12.0–15.0)
MCH: 29.1 pg (ref 26.0–34.0)
MCHC: 31.9 g/dL (ref 30.0–36.0)
MCV: 91.3 fL (ref 80.0–100.0)
Platelets: 450 10*3/uL — ABNORMAL HIGH (ref 150–400)
RBC: 2.75 MIL/uL — ABNORMAL LOW (ref 3.87–5.11)
RDW: 18.5 % — ABNORMAL HIGH (ref 11.5–15.5)
WBC: 5.4 10*3/uL (ref 4.0–10.5)
nRBC: 0.9 % — ABNORMAL HIGH (ref 0.0–0.2)

## 2020-05-12 LAB — BASIC METABOLIC PANEL
Anion gap: 12 (ref 5–15)
BUN: 52 mg/dL — ABNORMAL HIGH (ref 6–20)
CO2: 27 mmol/L (ref 22–32)
Calcium: 7.5 mg/dL — ABNORMAL LOW (ref 8.9–10.3)
Chloride: 101 mmol/L (ref 98–111)
Creatinine, Ser: 3.66 mg/dL — ABNORMAL HIGH (ref 0.44–1.00)
GFR calc Af Amer: 15 mL/min — ABNORMAL LOW (ref >60)
GFR calc non Af Amer: 13 mL/min — ABNORMAL LOW (ref >60)
Glucose, Bld: 72 mg/dL (ref 70–99)
Potassium: 3.8 mmol/L (ref 3.5–5.1)
Sodium: 140 mmol/L (ref 135–145)

## 2020-05-12 LAB — MAGNESIUM: Magnesium: 2 mg/dL (ref 1.7–2.4)

## 2020-05-12 SURGERY — SHOULDER ARTHROSCOPY WITH SUBACROMIAL DECOMPRESSION AND OPEN ROTATOR CUFF REPAIR, OPEN BICEPS TENDON REPAIR
Anesthesia: Choice | Laterality: Right

## 2020-05-12 MED ORDER — METOPROLOL TARTRATE 5 MG/5ML IV SOLN
5.0000 mg | Freq: Four times a day (QID) | INTRAVENOUS | Status: DC | PRN
  Administered 2020-05-12: 5 mg via INTRAVENOUS
  Filled 2020-05-12 (×3): qty 5

## 2020-05-12 NOTE — Progress Notes (Signed)
OT Cancellation Note  Patient Details Name: CHERYLIN WAGUESPACK MRN: [id number] DOB: Dec 09, 1960   Cancelled Treatment:    Reason Eval/Treat Not Completed: Patient at procedure or test/ unavailable. Upon arrival transport present for HD. Will follow up at later date/time as able.   Dessie Coma, M.S. OTR/L  05/12/20, 2:37 PM  ascom [phone]

## 2020-05-12 NOTE — Progress Notes (Signed)
[facility]  ROUNDING NOTE   Subjective:   Patient stays lethargic, arousable to call, planning dialysis today. No acute respiratory distress noted.  Objective:  Vital signs in last 24 hours:  Temp:  [97.8 F (36.6 C)-98.5 F (36.9 C)] 97.8 F (36.6 C) (09/13 0535) Pulse Rate:  [77-115] 110 (09/13 0535) Resp:  [16-20] 18 (09/13 0535) BP: (101-105)/(63-68) 101/63 (09/13 0535) SpO2:  [95 %-98 %] 95 % (09/13 0535)  Weight change:  Filed Weights   05/02/20 0358 05/03/20 0547 05/09/20 0951  Weight: 135.2 kg 130.9 kg 119 kg    Intake/Output: I/O last 3 completed shifts: In: 555.1 [P.O.:60; I.V.:30.3; IV Piggyback:464.8] Out: 430 [Urine:400; Drains:30]   Intake/Output this shift:  No intake/output data recorded.  Physical Exam: General: Lethargic  Lungs:  Fine crackles + bilaterally, wheezes +  Heart: S1S2 Regular  Abdomen:  Obese, non tender, non distended  Extremities: Trace peripheral edema +  Neurologic: lethargic, drowsy, opens eyes to call   Skin No new lesions or rashes noted  Access: RIJ permcath 8/27 Dr. [name]    Basic Metabolic Panel: Recent Labs  Lab 05/06/20 0701 05/07/20 1531 05/07/20 1547 05/07/20 1547 05/08/20 1137 05/08/20 1137 05/09/20 0930 05/09/20 0930 05/10/20 0605 05/11/20 0708 05/12/20 0744  NA 139  --  140   < > 139  --  143  --  141 141 140  K 4.1  --  3.6   < > 3.7  --  3.6  --  3.7 3.7 3.8  CL 99  --  99   < > 98  --  101  --  102 102 101  CO2 27  --  28   < > 27  --  30  --  27 28 27   GLUCOSE 101*  --  86   < > 80  --  85  --  77 70 72  BUN 72*  --  60*   < > 54*  --  60*  --  41* 45* 52*  CREATININE 4.96*  --  3.81*   < > 3.87*  --  3.74*  --  2.96* 3.45* 3.66*  CALCIUM 7.7*  --  7.5*   < > 7.3*   < > 7.3*   < > 7.6* 7.5* 7.5*  MG 2.1   < >  --    < > 1.8  --  2.1  --  2.1 2.0 2.0  PHOS 7.5*  --  5.1*  --   --   --   --   --   --   --   --    < > = values in this interval not displayed.    Liver Function  Tests: Recent Labs  Lab 05/06/20 0701 05/07/20 1547  ALBUMIN 1.3* 1.2*   No results for input(s): LIPASE, AMYLASE in the last 168 hours. No results for input(s): AMMONIA in the last 168 hours.  CBC: Recent Labs  Lab 05/06/20 0701 05/07/20 1547 05/08/20 1137 05/09/20 0930 05/10/20 0605 05/11/20 0708 05/12/20 0744  WBC 5.4   < > 4.1 2.8* 2.7* 2.9* 5.4  NEUTROABS 4.1  --   --   --   --   --   --   HGB 7.8*   < > 8.0* 6.9* 8.5* 7.5* 8.0*  HCT 23.3*   < > 24.2* 20.4* 26.1* 23.2* 25.1*  MCV 89.6   < > 93.1 89.1 91.3 90.3 91.3  PLT 274   < > 341  380 383 405* 450*   < > = values in this interval not displayed.    Cardiac Enzymes: No results for input(s): CKTOTAL, CKMB, CKMBINDEX, TROPONINI in the last 168 hours.  BNP: Invalid input(s): POCBNP  CBG: No results for input(s): GLUCAP in the last 168 hours.  Microbiology: Results for orders placed or performed during the hospital encounter of 04/14/20  SARS Coronavirus 2 by RT PCR (hospital order, performed in [facility] hospital lab) Nasopharyngeal Nasopharyngeal Swab     Status: None   Collection Time: 04/14/20  2:17 AM   Specimen: Nasopharyngeal Swab  Result Value Ref Range Status   SARS Coronavirus 2 NEGATIVE NEGATIVE Final    Comment: (NOTE) SARS-CoV-2 target nucleic acids are NOT DETECTED.  The SARS-CoV-2 RNA is generally detectable in upper and lower respiratory specimens during the acute phase of infection. The lowest concentration of SARS-CoV-2 viral copies this assay can detect is 250 copies / mL. A negative result does not preclude SARS-CoV-2 infection and should not be used as the sole basis for treatment or other patient management decisions.  A negative result may occur with improper specimen collection / handling, submission of specimen other than nasopharyngeal swab, presence of viral mutation(s) within the areas targeted by this assay, and inadequate number of viral copies (<250 copies / mL). A negative  result must be combined with clinical observations, patient history, and epidemiological information.  Fact Sheet for Patients:   StrictlyIdeas.no  Fact Sheet for Healthcare Providers: BankingDealers.co.za  This test is not yet approved or  cleared by the [place] FDA and has been authorized for detection and/or diagnosis of SARS-CoV-2 by FDA under an Emergency Use Authorization (EUA).  This EUA will remain in effect (meaning this test can be used) for the duration of the COVID-19 declaration under Section 564(b)(1) of the Act, 21 U.S.C. section 360bbb-3(b)(1), unless the authorization is terminated or revoked sooner.  Performed at [facility], [address] [place], [place] [address]   CULTURE, BLOOD (ROUTINE X 2) w Reflex to ID Panel     Status: None   Collection Time: 04/14/20  8:14 AM   Specimen: BLOOD  Result Value Ref Range Status   Specimen Description BLOOD LEFT [facility]  Final   Special Requests   Final    BOTTLES DRAWN AEROBIC AND ANAEROBIC Blood Culture adequate volume   Culture   Final    NO GROWTH 5 DAYS Performed at [facility], [address] [place], [place] [address]    Report Status 04/19/2020 FINAL  Final  CULTURE, BLOOD (ROUTINE X 2) w Reflex to ID Panel     Status: None   Collection Time: 04/14/20  8:14 AM   Specimen: BLOOD  Result Value Ref Range Status   Specimen Description BLOOD LEFT WRIST  Final   Special Requests   Final    BOTTLES DRAWN AEROBIC AND ANAEROBIC Blood Culture adequate volume   Culture   Final    NO GROWTH 5 DAYS Performed at [facility], [address], [place], [place] [address]    Report Status 04/19/2020 FINAL  Final  Aerobic/Anaerobic Culture (surgical/deep wound)     Status: None   Collection Time: 04/14/20 11:14 AM   Specimen: Abscess  Result Value Ref Range Status   Specimen Description   Final    ABSCESS Performed at [facility],  [address], [place],  [address]    Special Requests   Final    NONE Performed  at [facility], [place], [place] [address]    Gram Stain   Final    NO WBC SEEN ABUNDANT GRAM NEGATIVE RODS FEW GRAM POSITIVE COCCI FEW GRAM POSITIVE RODS Performed at [facility], [place] [address] [place], [place] [address]    Culture   Final    ABUNDANT KLEBSIELLA PNEUMONIAE ABUNDANT PSEUDOMONAS AERUGINOSA MODERATE ESCHERICHIA COLI Confirmed Extended Spectrum Beta-Lactamase Producer (ESBL).  In bloodstream infections from ESBL organisms, carbapenems are preferred over piperacillin/tazobactam. They are shown to have a lower risk of mortality. MIXED ANAEROBIC FLORA PRESENT.  CALL LAB IF FURTHER IID REQUIRED.    Report Status 04/20/2020 FINAL  Final   Organism ID, Bacteria KLEBSIELLA PNEUMONIAE  Final   Organism ID, Bacteria PSEUDOMONAS AERUGINOSA  Final   Organism ID, Bacteria ESCHERICHIA COLI  Final      Susceptibility   Escherichia coli - MIC*    AMPICILLIN >=32 RESISTANT Resistant     CEFAZOLIN >=64 RESISTANT Resistant     CEFEPIME 16 RESISTANT Resistant     CEFTAZIDIME RESISTANT Resistant     CEFTRIAXONE >=64 RESISTANT Resistant     CIPROFLOXACIN >=4 RESISTANT Resistant     GENTAMICIN <=1 SENSITIVE Sensitive     IMIPENEM <=0.25 SENSITIVE Sensitive     TRIMETH/SULFA >=320 RESISTANT Resistant     AMPICILLIN/SULBACTAM >=32 RESISTANT Resistant     PIP/TAZO 8 SENSITIVE Sensitive     * MODERATE ESCHERICHIA COLI   Klebsiella pneumoniae - MIC*    AMPICILLIN >=32 RESISTANT Resistant     CEFAZOLIN <=4 SENSITIVE Sensitive     CEFEPIME <=0.12 SENSITIVE Sensitive     CEFTAZIDIME <=1 SENSITIVE Sensitive     CEFTRIAXONE <=0.25 SENSITIVE Sensitive     CIPROFLOXACIN <=0.25 SENSITIVE Sensitive     GENTAMICIN <=1 SENSITIVE Sensitive     IMIPENEM <=0.25 SENSITIVE Sensitive     TRIMETH/SULFA <=20 SENSITIVE Sensitive     AMPICILLIN/SULBACTAM 16 INTERMEDIATE  Intermediate     PIP/TAZO 8 SENSITIVE Sensitive     * ABUNDANT KLEBSIELLA PNEUMONIAE   Pseudomonas aeruginosa - MIC*    CEFTAZIDIME 4 SENSITIVE Sensitive     CIPROFLOXACIN <=0.25 SENSITIVE Sensitive     GENTAMICIN 2 SENSITIVE Sensitive     IMIPENEM 2 SENSITIVE Sensitive     PIP/TAZO 8 SENSITIVE Sensitive     CEFEPIME 4 SENSITIVE Sensitive     * ABUNDANT PSEUDOMONAS AERUGINOSA  MRSA PCR Screening     Status: None   Collection Time: 04/14/20  6:19 PM   Specimen: Nasopharyngeal  Result Value Ref Range Status   MRSA by PCR NEGATIVE NEGATIVE Final    Comment:        The GeneXpert MRSA Assay (FDA approved for NASAL specimens only), is one component of a comprehensive MRSA colonization surveillance program. It is not intended to diagnose MRSA infection nor to guide or monitor treatment for MRSA infections. Performed at [facility], [address], [place], [place] [address]   Urine Culture     Status: None   Collection Time: 04/15/20  4:28 AM   Specimen: Urine, Random  Result Value Ref Range Status   Specimen Description   Final    URINE, RANDOM Performed at [facility], [address] [place], [place] [address]    Special Requests   Final    NONE Performed at [facility], [address] [place], [place] [address]    Culture   Final    NO GROWTH Performed at  [facility], [place] [address], [place], [place] [address]    Report Status 04/16/2020 FINAL  Final  CULTURE, BLOOD (ROUTINE X 2) w Reflex to ID Panel     Status: None   Collection Time: 04/22/20  6:12 PM   Specimen: BLOOD  Result Value Ref Range Status   Specimen Description BLOOD FOOT  Final   Special Requests   Final    BOTTLES DRAWN AEROBIC AND ANAEROBIC Blood Culture adequate volume   Culture   Final    NO GROWTH 5 DAYS Performed at [facility], [address] [place], [place] [address]    Report Status 04/27/2020 FINAL  Final  CULTURE, BLOOD (ROUTINE X 2) w  Reflex to ID Panel     Status: None   Collection Time: 04/22/20  6:17 PM   Specimen: BLOOD  Result Value Ref Range Status   Specimen Description BLOOD FOOT  Final   Special Requests   Final    BOTTLES DRAWN AEROBIC AND ANAEROBIC Blood Culture results may not be optimal due to an excessive volume of blood received in culture bottles   Culture   Final    NO GROWTH 5 DAYS Performed at [facility], [address] [place], [place] [address]    Report Status 04/27/2020 FINAL  Final  CULTURE, BLOOD (ROUTINE X 2) w Reflex to ID Panel     Status: None   Collection Time: 05/01/20  2:50 PM   Specimen: BLOOD  Result Value Ref Range Status   Specimen Description BLOOD BLOOD LEFT HAND  Final   Special Requests   Final    BOTTLES DRAWN AEROBIC AND ANAEROBIC Blood Culture adequate volume   Culture   Final    NO GROWTH 5 DAYS Performed at [facility], [address] [place], [place] [address]    Report Status 05/06/2020 FINAL  Final  CULTURE, BLOOD (ROUTINE X 2) w Reflex to ID Panel     Status: None   Collection Time: 05/01/20  2:51 PM   Specimen: BLOOD  Result Value Ref Range Status   Specimen Description BLOOD BLOOD RIGHT HAND  Final   Special Requests   Final    BOTTLES DRAWN AEROBIC AND ANAEROBIC Blood Culture adequate volume   Culture   Final    NO GROWTH 5 DAYS Performed at [facility], [address] [place], [place] [address]    Report Status 05/06/2020 FINAL  Final    Coagulation Studies: No results for input(s): LABPROT, INR in the last 72 hours.  Urinalysis: No results for input(s): COLORURINE, LABSPEC, PHURINE, GLUCOSEU, HGBUR, BILIRUBINUR, KETONESUR, PROTEINUR, UROBILINOGEN, NITRITE, LEUKOCYTESUR in the last 72 hours.  Invalid input(s): APPERANCEUR    Imaging: No results found.   Medications:   . sodium chloride 250 mL (05/03/20 0043)  . sodium chloride 10 mL/hr at 05/12/20 0600  . anidulafungin Stopped (05/12/20 0231)  .  meropenem (MERREM) IV Stopped (05/12/20 0333)   . (feeding supplement) PROSource Plus  30 mL Oral TID BM  . acidophilus  1 capsule Oral Daily  . alteplase  2 mg Intracatheter Once  . alteplase  2 mg Intracatheter Once  . amiodarone  200 mg Oral BID  . Chlorhexidine Gluconate Cloth  6 each Topical Daily  . docusate sodium  200 mg Oral BID  . rilpivirine  25 mg Oral Q breakfast   And  . dolutegravir  50 mg Oral Q breakfast  . dronabinol  5 mg Oral  BID AC  . epoetin (EPOGEN/PROCRIT) injection  10,000 Units Intravenous Q M,W,F-HD  . famotidine  10 mg Oral Q1200  . feeding supplement (NEPRO CARB STEADY)  237 mL Oral TID BM  . guaiFENesin-dextromethorphan  10 mL Oral Q8H  . heparin injection (subcutaneous)  5,000 Units Subcutaneous Q8H  . magnesium oxide  400 mg Oral Daily  . mometasone-formoterol  2 puff Inhalation BID  . multivitamin  1 tablet Oral QHS  . sodium chloride flush  5 mL Intracatheter Q8H  . torsemide  40 mg Oral Daily   sodium chloride, acetaminophen **OR** acetaminophen, albuterol, ALPRAZolam, bisacodyl, diphenhydrAMINE, heparin NICU/SCN flush, HYDROmorphone (DILAUDID) injection, iohexol, ipratropium-albuterol, labetalol, magic mouthwash **AND** lidocaine, magnesium hydroxide, menthol-cetylpyridinium, oxyCODONE-acetaminophen, phenol, simethicone  Assessment/ Plan:  Melanie Bowen is a 59 y.o. black female with HIV, COPD, hypertension, GERD, who is admitted to [facility] on 04/14/2020 for Dyspnea [R06.00] Colonic diverticular abscess [K57.20] Diverticulitis of large intestine with abscess without bleeding [K57.20] Abscess of sigmoid colon due to diverticulitis [K57.20]  #Acute renal failure   No history of chronic kidney disease.   Requiring hemodialysis. First treatment on 8/18.  IV contrast exposure on 8/16. Ultrasound -negative for obstruction -  Dialysis today Will continue MWF schedule  Lab Results  Component Value Date   CREATININE 3.66 (H) 05/12/2020    CREATININE 3.45 (H) 05/11/2020   CREATININE 2.96 (H) 05/10/2020   Outpatient chair at [facility] MWF schedule.   #. Anemia of chronic kidney disease:  Lab Results  Component Value Date   HGB 8.0 (L) 05/12/2020   -Continue Epogen with dialysis treatments.  #Lower extremity edema Continue oral torsemide and ultrafiltration with dialysis treatments.  #Fever Receiving Anidulafungin and meropenem IV,started on 05/03/20 Will continue monitoring     LOS: 28 Christelle Igoe 9/13/202110:17 AM

## 2020-05-12 NOTE — Progress Notes (Signed)
Date of Admission:  04/14/2020     ID: Melanie Bowen is a 59 y.o. female  Principal Problem:   Severe sepsis with septic shock ([place]) Active Problems:   HTN (hypertension)   HIV (human immunodeficiency virus infection) ([place])   Diverticulitis of large intestine with abscess without bleeding   Abscess of sigmoid colon due to diverticulitis   Acute renal failure (ARF) ([place])   Hypotension   Diverticulosis   Acute respiratory distress   DNR (do not resuscitate) discussion   Palliative care by specialist   Dyspnea   Goals of care, counseling/discussion   Pressure injury of skin    Subjective: Undergoing dialysis lethargic  Medications:  . (feeding supplement) PROSource Plus  30 mL Oral TID BM  . acidophilus  1 capsule Oral Daily  . alteplase  2 mg Intracatheter Once  . alteplase  2 mg Intracatheter Once  . amiodarone  200 mg Oral BID  . Chlorhexidine Gluconate Cloth  6 each Topical Daily  . docusate sodium  200 mg Oral BID  . rilpivirine  25 mg Oral Q breakfast   And  . dolutegravir  50 mg Oral Q breakfast  . dronabinol  5 mg Oral BID AC  . epoetin (EPOGEN/PROCRIT) injection  10,000 Units Intravenous Q M,W,F-HD  . famotidine  10 mg Oral Q1200  . feeding supplement (NEPRO CARB STEADY)  237 mL Oral TID BM  . guaiFENesin-dextromethorphan  10 mL Oral Q8H  . heparin injection (subcutaneous)  5,000 Units Subcutaneous Q8H  . magnesium oxide  400 mg Oral Daily  . mometasone-formoterol  2 puff Inhalation BID  . multivitamin  1 tablet Oral QHS  . sodium chloride flush  5 mL Intracatheter Q8H  . torsemide  40 mg Oral Daily    Objective: Vital signs in last 24 hours: Temp:  [97.8 F (36.6 C)-98.5 F (36.9 C)] 97.8 F (36.6 C) (09/13 0535) Pulse Rate:  [77-115] 110 (09/13 0535) Resp:  [16-20] 18 (09/13 0535) BP: (101-105)/(63-68) 101/63 (09/13 0535) SpO2:  [95 %-98 %] 95 % (09/13 0535)  PHYSICAL EXAM:  Lethargic Incomprehensible speech Undergoing dialysis Limited  examination  Lab Results Recent Labs    05/11/20 0708 05/12/20 0744  WBC 2.9* 5.4  HGB 7.5* 8.0*  HCT 23.2* 25.1*  NA 141 140  K 3.7 3.8  CL 102 101  CO2 28 27  BUN 45* 52*  CREATININE 3.45* 3.66*   Liver Panel No results for input(s): PROT, ALBUMIN, AST, ALT, ALKPHOS, BILITOT, BILIDIR, IBILI in the last 72 hours. Sedimentation Rate No results for input(s): ESRSEDRATE in the last 72 hours. C-Reactive Protein No results for input(s): CRP in the last 72 hours.  Microbiology:  Studies/Results: No results found.   Assessment/Plan: Septic shocksecondary to perforated diverticulitisand peridiverticular abscess. pseudomonas, kleb andESBLe.coli in the culture. JP drain inserted on 04/14/20.  meropenem 04/15/20>>05/06/20 and restarted 05/09/20 for fever anidulafungin 8/17>>8/24and restarted on 9/3/21for fever and leucocytosis-both had resolved but fever started on 05/09/20 and meropenem reintroduced. No fever in 24 hrs Repeat Ct abdomen and if no abscess JP drain to be removed May need diagnostic paracentesis   Encephalopathy-metabolic -fluctuating-  Hypoxic resp failureon presentation-needed intubation for a few days s/p extubation Rigorousincentive spirometry Chest PT because of secretions and atelectasis   AKI due to septic shock/contrast nephropathy-getting dialysis  Anasarca-much better  HIV- well controlled in jan 2021 her Vl <20 and cd4 >1000.  On 05/03/20 Vl is < 20 [id number] ( 50%) On dolutegravir and rilpivirine  but not taking any oral medications   Afib on amiodarone but not taking it    Discussed the management with the surgeon

## 2020-05-12 NOTE — Progress Notes (Signed)
PROGRESS NOTE    Melanie Bowen  HCW:[id number] DOB: 06/10/1961 DOA: 04/14/2020 PCP: Theotis Burrow, MD  Assessment & Plan:   Principal Problem:   Severe sepsis with septic shock ([place]) Active Problems:   HTN (hypertension)   HIV (human immunodeficiency virus infection) ([place])   Diverticulitis of large intestine with abscess without bleeding   Abscess of sigmoid colon due to diverticulitis   Acute renal failure (ARF) ([place])   Hypotension   Diverticulosis   Acute respiratory distress   DNR (do not resuscitate) discussion   Palliative care by specialist   Dyspnea   Goals of care, counseling/discussion   Pressure injury of skin   Non-compliance: pt taking some of her medications today. Pt finally participated in therapy on 05/10/20 w/ pt's daughter present at bedside. PT still recs SNF. Pt's daughter [name], is aware of the pt's non-compliance. Psych saw pt but the pt refused to take any anti-depressant or anti-anxiety meds. Palliative care saw the pt and will continue w/ current treatments but no BiPAP or intubation.  Acute sigmoid diverticulitis and diverticular abscess: s/p CT-guided drainage by IR on 04/14/2020.  Fluid culture showed Klebsiella pneumonia, pseudomonas aeruginosa and E. Coli. Completed IV merrem course but restarted on 05/09/20 for a fever as per ID. Continue on IV anidulafungin & will switch to po fluconazole at d/c as per ID. Repeat CT abd/pelvis ordered as per ID.   AKI: continue on HD as per nephro. Has an outpatient HD spot set up but pt refuses to sit in a chair for HD   A. fib: continue on amiodarone. Will consider long-term anticoagulation in the future  Poor po intake: started on an appetite stimulant, dronabinol, as per pt's daughter request   Septic shock:  resolved  Acute normocytic anemia: s/p 3 units of pRBCs.  Hb & Hct are trending up today, labile. Will continue to monitor   COPD exacerbation: resolved. Continue on bronchodilators &  incentive spirometry   HIV infection: continue on rilpivirine, dolutegravir as per ID   Thrombocytosis: etiology unclear. Will continue to monitor   Acute liver failure: resolved  Leukocytosis: resolved  Acute hypoxemic respiratory failure: respiratory status is labile. Continue on supplemental oxygen and wean as tolerated  Acute metabolic encephalopathy/delirium: labile. No acute abnormality on CT head. Re-orient prn   History of stroke: not on statin, aspirin or plavix at home.   DVT prophylaxis: heparin  Code Status: full  Family Communication: discussed pt's care w/ pt's sister, Lynelle Smoke, who was at bedside and answered her questions  Disposition Plan:  Likely d/c to SNF  Status is: Inpatient  Remains inpatient appropriate because:IV treatments appropriate due to intensity of illness or inability to take PO, intermittently takes meds but wont sit in a chair for HD. Pt has to sit in chair for HD prior to being d/c to SNF    Dispo: The patient is from: Home              Anticipated d/c is to: SNF              Anticipated d/c date is: > 3 days              Patient currently is not medically stable to d/c.     Consultants:   nephro   gen surg    Vascular surg   ID   Procedures:   Antimicrobials: merrem   Subjective: Pt is lethargic again today and will not answer my questions  Objective: Vitals:   05/11/20 0543 05/11/20 1727 05/11/20 2007 05/12/20 0535  BP: 95/62 105/68 102/64 101/63  Pulse: (!) 106 (!) 115 77 (!) 110  Resp: 16 16 20 18   Temp: 99.5 F (37.5 C) 98.2 F (36.8 C) 98.5 F (36.9 C) 97.8 F (36.6 C)  TempSrc: Oral Oral Oral Oral  SpO2: 98% 97% 98% 95%  Weight:      Height:        Intake/Output Summary (Last 24 hours) at 05/12/2020 0750 Last data filed at 05/12/2020 0600 Gross per 24 hour  Intake 265.14 ml  Output 30 ml  Net 235.14 ml   Filed Weights   05/02/20 0358 05/03/20 0547 05/09/20 0951  Weight: 135.2 kg 130.9 kg 119  kg    Examination:      Data Reviewed: I have personally reviewed following labs and imaging studies  CBC: Recent Labs  Lab 05/05/20 0831 05/05/20 0831 05/06/20 0701 05/06/20 0701 05/07/20 1547 05/08/20 1137 05/09/20 0930 05/10/20 0605 05/11/20 0708  WBC 7.2   < > 5.4   < > 3.4* 4.1 2.8* 2.7* 2.9*  NEUTROABS 5.9  --  4.1  --   --   --   --   --   --   HGB 6.8*   < > 7.8*   < > 7.2* 8.0* 6.9* 8.5* 7.5*  HCT 20.6*   < > 23.3*   < > 21.8* 24.2* 20.4* 26.1* 23.2*  MCV 91.6   < > 89.6   < > 92.4 93.1 89.1 91.3 90.3  PLT 216   < > 274   < > 302 341 380 383 405*   < > = values in this interval not displayed.   Basic Metabolic Panel: Recent Labs  Lab 05/05/20 0831 05/05/20 0831 05/06/20 0701 05/06/20 0701 05/07/20 1531 05/07/20 1547 05/08/20 1137 05/09/20 0930 05/10/20 0605 05/11/20 0708  NA 139   < > 139   < >  --  140 139 143 141 141  K 3.9   < > 4.1   < >  --  3.6 3.7 3.6 3.7 3.7  CL 101   < > 99   < >  --  99 98 101 102 102  CO2 26   < > 27   < >  --  28 27 30 27 28   GLUCOSE 103*   < > 101*   < >  --  86 80 85 77 70  BUN 66*   < > 72*   < >  --  60* 54* 60* 41* 45*  CREATININE 4.66*   < > 4.96*   < >  --  3.81* 3.87* 3.74* 2.96* 3.45*  CALCIUM 7.5*   < > 7.7*   < >  --  7.5* 7.3* 7.3* 7.6* 7.5*  MG 1.9   < > 2.1   < > 2.2  --  1.8 2.1 2.1 2.0  PHOS 6.4*  --  7.5*  --   --  5.1*  --   --   --   --    < > = values in this interval not displayed.   GFR: Estimated Creatinine Clearance: 23.3 mL/min (A) (by C-G formula based on SCr of 3.45 mg/dL (H)). Liver Function Tests: Recent Labs  Lab 05/05/20 0831 05/06/20 0701 05/07/20 1547  ALBUMIN 1.3* 1.3* 1.2*   No results for input(s): LIPASE, AMYLASE in the last 168 hours. No results for input(s): AMMONIA in the last 168 hours.  Coagulation Profile: No results for input(s): INR, PROTIME in the last 168 hours. Cardiac Enzymes: No results for input(s): CKTOTAL, CKMB, CKMBINDEX, TROPONINI in the last 168  hours. BNP (last 3 results) No results for input(s): PROBNP in the last 8760 hours. HbA1C: No results for input(s): HGBA1C in the last 72 hours. CBG: No results for input(s): GLUCAP in the last 168 hours. Lipid Profile: No results for input(s): CHOL, HDL, LDLCALC, TRIG, CHOLHDL, LDLDIRECT in the last 72 hours. Thyroid Function Tests: No results for input(s): TSH, T4TOTAL, FREET4, T3FREE, THYROIDAB in the last 72 hours. Anemia Panel: No results for input(s): VITAMINB12, FOLATE, FERRITIN, TIBC, IRON, RETICCTPCT in the last 72 hours. Sepsis Labs: No results for input(s): PROCALCITON, LATICACIDVEN in the last 168 hours.  No results found for this or any previous visit (from the past 240 hour(s)).       Radiology Studies: No results found.      Scheduled Meds: . (feeding supplement) PROSource Plus  30 mL Oral TID BM  . acidophilus  1 capsule Oral Daily  . alteplase  2 mg Intracatheter Once  . alteplase  2 mg Intracatheter Once  . amiodarone  200 mg Oral BID  . Chlorhexidine Gluconate Cloth  6 each Topical Daily  . docusate sodium  200 mg Oral BID  . rilpivirine  25 mg Oral Q breakfast   And  . dolutegravir  50 mg Oral Q breakfast  . dronabinol  5 mg Oral BID AC  . epoetin (EPOGEN/PROCRIT) injection  10,000 Units Intravenous Q M,W,F-HD  . famotidine  10 mg Oral Q1200  . feeding supplement (NEPRO CARB STEADY)  237 mL Oral TID BM  . guaiFENesin-dextromethorphan  10 mL Oral Q8H  . heparin injection (subcutaneous)  5,000 Units Subcutaneous Q8H  . magnesium oxide  400 mg Oral Daily  . mometasone-formoterol  2 puff Inhalation BID  . multivitamin  1 tablet Oral QHS  . sodium chloride flush  5 mL Intracatheter Q8H  . torsemide  40 mg Oral Daily   Continuous Infusions: . sodium chloride 250 mL (05/03/20 0043)  . sodium chloride 10 mL/hr at 05/12/20 0600  . anidulafungin Stopped (05/12/20 0231)  . meropenem (MERREM) IV Stopped (05/12/20 0333)     LOS: 28 days    Time  spent: 20 mins     Wyvonnia Dusky, MD [facility] Pager [phone]  If 7PM-7AM, please contact night-coverage [email or web address] 05/12/2020, 7:50 AM

## 2020-05-12 NOTE — Progress Notes (Addendum)
Pt had episodes of sustaining  a-fib up to 150's at 13:30 for about  30-45 mins.  and second episode was  at dialysis. MD was notified and new order received.   Pt is currently in dialysis getting treatment in the chair.

## 2020-05-12 NOTE — Progress Notes (Signed)
PT Cancellation Note  Patient Details Name: Melanie Bowen MRN: [id number] DOB: 14-Apr-1961   Cancelled Treatment:    Reason Eval/Treat Not Completed: Other (comment). Pt not available for therapy at this time. Currently receiving HD. Will re-attempt next date.   Rheda Kassab 05/12/2020, 3:19 PM  Greggory Stallion, PT, DPT [phone]

## 2020-05-13 LAB — BASIC METABOLIC PANEL
Anion gap: 11 (ref 5–15)
BUN: 33 mg/dL — ABNORMAL HIGH (ref 6–20)
CO2: 26 mmol/L (ref 22–32)
Calcium: 7.3 mg/dL — ABNORMAL LOW (ref 8.9–10.3)
Chloride: 101 mmol/L (ref 98–111)
Creatinine, Ser: 2.69 mg/dL — ABNORMAL HIGH (ref 0.44–1.00)
GFR calc Af Amer: 22 mL/min — ABNORMAL LOW (ref >60)
GFR calc non Af Amer: 19 mL/min — ABNORMAL LOW (ref >60)
Glucose, Bld: 86 mg/dL (ref 70–99)
Potassium: 3.5 mmol/L (ref 3.5–5.1)
Sodium: 138 mmol/L (ref 135–145)

## 2020-05-13 LAB — CBC
HCT: 22.6 % — ABNORMAL LOW (ref 36.0–46.0)
Hemoglobin: 7.3 g/dL — ABNORMAL LOW (ref 12.0–15.0)
MCH: 29.3 pg (ref 26.0–34.0)
MCHC: 32.3 g/dL (ref 30.0–36.0)
MCV: 90.8 fL (ref 80.0–100.0)
Platelets: 390 10*3/uL (ref 150–400)
RBC: 2.49 MIL/uL — ABNORMAL LOW (ref 3.87–5.11)
RDW: 18 % — ABNORMAL HIGH (ref 11.5–15.5)
WBC: 14.3 10*3/uL — ABNORMAL HIGH (ref 4.0–10.5)
nRBC: 0.7 % — ABNORMAL HIGH (ref 0.0–0.2)

## 2020-05-13 MED ORDER — DOCUSATE SODIUM 100 MG PO CAPS: 200.0000 mg | ORAL_CAPSULE | Freq: Two times a day (BID) | ORAL | Status: DC | PRN

## 2020-05-13 MED ORDER — ADULT MULTIVITAMIN W/MINERALS CH
1.0000 | ORAL_TABLET | Freq: Every day | ORAL | Status: DC
  Administered 2020-05-14 – 2020-05-22 (×8): 1 via ORAL
  Filled 2020-05-13 (×8): qty 1

## 2020-05-13 MED ORDER — ENSURE ENLIVE PO LIQD
237.0000 mL | Freq: Three times a day (TID) | ORAL | Status: DC
  Administered 2020-05-13 – 2020-05-21 (×12): 237 mL via ORAL

## 2020-05-13 NOTE — Progress Notes (Signed)
PROGRESS NOTE   HPI was taken from Dr. Sidney Ace: Melanie Bowen  is a 59 y.o. female with a known history of COPD/asthma, hypertension, diverticulosis, HIV, GERD and depression, presented to the emergency room with acute onset of worsening left lower quadrant abdominal pain with associated nausea without vomiting as well as constipation.  The patient started having pain Friday 9 days ago when she was seen in the ER and given p.o. Augmentin which helped improve her symptoms some until her pain got worse since yesterday.  No dysuria, oliguria or hematuria or flank pain.  No cough or wheezing or dyspnea.  No chest pain or palpitations.  No melena or bright red bleeding per rectum.  No other bleeding diathesis.  Upon presentation to the emergency room, blood pressure was 151/98 with otherwise normal vital signs.  Labs revealed albumin of 2.8 with troponin of 6.4 and otherwise unremarkable CMP.  Urinalysis was unremarkable.  Abdominal and pelvic CT scan revealed the following: 1. Findings consistent with acute sigmoid colon diverticulitis. 7.5 x 4.4 cm gas and fluid collection within the anterior pelvis suspicious for contained perforation/abscess, this appears to communicate with the thickened inflamed sigmoid colon. 2. Small amount of upper abdominal and pelvic ascites. 3. Thickened appearance of pelvic small bowel loops, likely reactive  The patient was given 1 L bolus of IV lactated Ringer, 1 p.o. Norco, half milligram of IV Dilaudid, 4 mg of IV morphine sulfate and 4 mg of IV Zofran as well as 3.375 g of IV Zosyn.  Dr. Christian Mate was consulted about the patient and he will notify IR in a.m.  She will be admitted to a medical bed for further evaluation and management.  As per Dr. Mal Misty: Melanie Bowen is a 59 y.o. female significant for hypertension, diverticulosis, enteric infection, morbid obesity, GERD, COPD, history of stroke, asthma, depression, seizures enzymes with acute onset of abdominal  pain nausea vomiting and constipation.   She was admitted to the hospital for septic shock secondary to acute diverticulitis with diverticular abscess.  She was treated with IV fluids and empiric IV antimicrobial therapy.  She required vasopressors for septic shock.  She was seen by interventional radiologist and CT-guided drainage was done by IR on 04/14/2020.  Fluid culture showed Pseudomonas aeruginosa, Klebsiella pneumonia and E. coli.  Septic shock was complicated by acute kidney injury, acute liver failure, acute metabolic encephalopathy and acute hypoxemic respiratory failure.  She was seen by the nephrologist for AKI.  Patient was started on hemodialysis for AKI. She developed atrial fibrillation with RVR.  She was seen in consultation by the cardiologist as well.   Hospital Course from Dr. Lenise Herald 05/07/20-05/13/20: During the week, the pt has been non-compliant with medications, HD, eating and not participating in therapy. Pt did participate in therapy one day this week but only evidently did the bare minimum. Palliative care has continue to see the pt but the pt and pt's family will not agree to comfort care either. Unsure of how to proceed with pt has pt consistently is refuses meds, care and HD. Pt will go to HD but refuse to sit in a chair and then asked to be taken off of HD early.Today, the pt sat in the chair for HD for the first time this week. Pt's family is aware of pt's non-compliance but is unsure why the pt is doing this. Of note, pt did spike a fever this week and ID put the pt back on IV merrem. Repeat CT abd/pelvis  shows extraluminal enteric contrast adjacent to the lateral segment of the left hepatic lobe which has increased in volume since prior examination. The exact source of leakage, however, is not clearly identified on this examination.Loculated collection of extraluminal gas adjacent to the terminal ileum. The pigtail drainage catheter is just within this collection,unchanged  from prior examination. The volume of gas is increased since prior examination, possibly the result of a flushing of the drainage catheter or a continued enteric communication. Mild ileus. Improved body wall edema.   Melanie Bowen  XVQ:[id number] DOB: 05/31/1961 DOA: 04/14/2020 PCP: Theotis Burrow, MD  Assessment & Plan:   Principal Problem:   Severe sepsis with septic shock [facility]) Active Problems:   HTN (hypertension)   HIV (human immunodeficiency virus infection) ([place])   Diverticulitis of large intestine with abscess without bleeding   Abscess of sigmoid colon due to diverticulitis   Acute renal failure (ARF) ([place])   Hypotension   Diverticulosis   Acute respiratory distress   DNR (do not resuscitate) discussion   Palliative care by specialist   Dyspnea   Goals of care, counseling/discussion   Pressure injury of skin   Non-compliance: pt tooks her morning meds today but is still refusing to eat. Pt finally participated in therapy on 05/10/20 w/ pt's daughter present at bedside. PT still recs SNF. Pt's daughter [name], is aware of the pt's non-compliance. Psych saw pt but the pt refused to take any anti-depressant or anti-anxiety meds. Palliative care saw the pt and will continue w/ current treatments but no BiPAP or intubation.  Acute sigmoid diverticulitis and diverticular abscess: s/p CT-guided drainage by IR on 04/14/2020.  Fluid culture showed Klebsiella pneumonia, pseudomonas aeruginosa and E. Coli. Completed IV merrem course but restarted on 05/09/20 for a fever as per ID. Continue on IV anidulafungin & will switch to po fluconazole at d/c as per ID. Repeat CT abd/pelvis shows extraluminal enteric contrast adjacent to the lateral segment of the left hepatic lobe which has increased in volume since prior examination. The exact source of leakage, however, is not clearly identified on this examination. Loculated collection of extraluminal gas adjacent to the terminal ileum. ID &  gen surg are aware   AKI: continue on HD as per nephro. Has an outpatient HD spot set up but pt refuses to sit in a chair for HD   A. fib: continue on amiodarone. Will consider long-term anticoagulation in the future  Poor po intake: started on an appetite stimulant, dronabinol, as per pt's daughter request   Septic shock:  resolved  Acute normocytic anemia: s/p 3 units of pRBCs. H&H are labile, will continue to monitor   COPD exacerbation: resolved. Continue on bronchodilators & incentive spirometry   HIV infection: continue on rilpivirine, dolutegravir as per ID   Thrombocytosis: etiology unclear. Will continue to monitor   Acute liver failure: resolved  Leukocytosis: resolved  Acute hypoxemic respiratory failure: respiratory status is labile. Continue on supplemental oxygen and wean as tolerated  Acute metabolic encephalopathy/delirium: labile. No acute abnormality on CT head. Re-orient prn   History of stroke: not on statin, aspirin or plavix at home.   DVT prophylaxis: heparin  Code Status: full  Family Communication: discussed pt's care w/ pt's sister, Lynelle Smoke, who was at bedside and answered her questions  Disposition Plan:  Likely d/c to SNF  Status is: Inpatient  Remains inpatient appropriate because:IV treatments appropriate due to intensity of illness or inability to take PO, intermittently non-compliant. Took morning meds today  but refuses to eat.    Dispo: The patient is from: Home              Anticipated d/c is to: SNF              Anticipated d/c date is: > 3 days              Patient currently is not medically stable to d/c.     Consultants:   nephro   gen surg    Vascular surg   ID   Procedures:   Antimicrobials: merrem   Subjective: Pt is more awake today and asked for more water. Pt denies any pain   Objective: Vitals:   05/12/20 1815 05/12/20 1830 05/12/20 2300 05/13/20 0502  BP: (!) 86/54  105/60 105/66  Pulse: (!) 110   (!) 110 (!) 107  Resp: (!) 28  (!) 22 (!) 24  Temp:  98.6 F (37 C) 98.2 F (36.8 C) 97.7 F (36.5 C)  TempSrc:  Oral Oral Oral  SpO2: 97%  93% 93%  Weight:      Height:        Intake/Output Summary (Last 24 hours) at 05/13/2020 0802 Last data filed at 05/12/2020 1830 Gross per 24 hour  Intake --  Output 49 ml  Net -49 ml   Filed Weights   05/02/20 0358 05/03/20 0547 05/09/20 0951  Weight: 135.2 kg 130.9 kg 119 kg    Examination:  General exam:Appears awake but uncomfortable Respiratory system:decreased breath sounds b/l Cardiovascular system:irregularly irregular. No rubs, gallops or clicks.  Gastrointestinal system:Abdomen is obese, soft and nontender.Hyperactive bowel sounds  Central nervous system:Awake & alert. Moves all 4 extremities Psychiatry:Judgement and insight appear abnormal.Flat mood and affect    Data Reviewed: I have personally reviewed following labs and imaging studies  CBC: Recent Labs  Lab 05/08/20 1137 05/09/20 0930 05/10/20 0605 05/11/20 0708 05/12/20 0744  WBC 4.1 2.8* 2.7* 2.9* 5.4  HGB 8.0* 6.9* 8.5* 7.5* 8.0*  HCT 24.2* 20.4* 26.1* 23.2* 25.1*  MCV 93.1 89.1 91.3 90.3 91.3  PLT 341 380 383 405* 161*   Basic Metabolic Panel: Recent Labs  Lab 05/07/20 1547 05/07/20 1547 05/08/20 1137 05/09/20 0930 05/10/20 0605 05/11/20 0708 05/12/20 0744  NA 140   < > 139 143 141 141 140  K 3.6   < > 3.7 3.6 3.7 3.7 3.8  CL 99   < > 98 101 102 102 101  CO2 28   < > 27 30 27 28 27   GLUCOSE 86   < > 80 85 77 70 72  BUN 60*   < > 54* 60* 41* 45* 52*  CREATININE 3.81*   < > 3.87* 3.74* 2.96* 3.45* 3.66*  CALCIUM 7.5*   < > 7.3* 7.3* 7.6* 7.5* 7.5*  MG  --    < > 1.8 2.1 2.1 2.0 2.0  PHOS 5.1*  --   --   --   --   --   --    < > = values in this interval not displayed.   GFR: Estimated Creatinine Clearance: 22 mL/min (A) (by C-G formula based on SCr of 3.66 mg/dL (H)). Liver Function Tests: Recent Labs  Lab 05/07/20 1547   ALBUMIN 1.2*   No results for input(s): LIPASE, AMYLASE in the last 168 hours. No results for input(s): AMMONIA in the last 168 hours. Coagulation Profile: No results for input(s): INR, PROTIME in the last 168 hours. Cardiac Enzymes: No results  for input(s): CKTOTAL, CKMB, CKMBINDEX, TROPONINI in the last 168 hours. BNP (last 3 results) No results for input(s): PROBNP in the last 8760 hours. HbA1C: No results for input(s): HGBA1C in the last 72 hours. CBG: No results for input(s): GLUCAP in the last 168 hours. Lipid Profile: No results for input(s): CHOL, HDL, LDLCALC, TRIG, CHOLHDL, LDLDIRECT in the last 72 hours. Thyroid Function Tests: No results for input(s): TSH, T4TOTAL, FREET4, T3FREE, THYROIDAB in the last 72 hours. Anemia Panel: No results for input(s): VITAMINB12, FOLATE, FERRITIN, TIBC, IRON, RETICCTPCT in the last 72 hours. Sepsis Labs: No results for input(s): PROCALCITON, LATICACIDVEN in the last 168 hours.  No results found for this or any previous visit (from the past 240 hour(s)).       Radiology Studies: CT ABDOMEN PELVIS WO CONTRAST  Result Date: 05/12/2020 CLINICAL DATA:  Intra-abdominal abscess, fever EXAM: CT ABDOMEN AND PELVIS WITHOUT CONTRAST TECHNIQUE: Multidetector CT imaging of the abdomen and pelvis was performed following the standard protocol without IV contrast. COMPARISON:  None. FINDINGS: Lower chest: The visualized lung bases are clear bilaterally. Cardiac blood pool is of relatively low attenuation in keeping with at least moderate anemia. Global cardiac size within normal limits. Hepatobiliary: Hyperdensity of the gallbladder contents likely represents vicarious excretion of contrast. The gallbladder and liver are otherwise unremarkable. No intra or extrahepatic biliary ductal dilation. Pancreas: Unremarkable Spleen: Unremarkable Adrenals/Urinary Tract: Unremarkable Stomach/Bowel: There is extraluminal enteric contrast identified adjacent to the  lateral segment of the left hepatic lobe which has increased in volume since prior examination. The exact source of leakage, however, is not clearly identified on this examination. The small bowel is diffusely mildly dilated containing both gas and enteric contrast in keeping with changes of a mild ileus. Oral contrast, however, does appear to extend into the terminal ileum. Adjacent to the terminal ileum is a loculated collection of extraluminal gas measuring 6.7 x 2.5 cm. The pigtail drainage catheter is just within this collection, unchanged from prior examination. The volume of gas is increased since prior examination, possibly the result of a flushing of the drainage catheter or a a continued enteric communication. There is extensive descending and sigmoid colonic diverticulosis without superimposed inflammatory change. Large bowel otherwise unremarkable. Appendix normal. There is mild ascites present, similar in volume to prior examination. Diffuse body wall edema has improved in the interval since prior examination. Vascular/Lymphatic: The abdominal vasculature is age-appropriate with moderate aortoiliac atherosclerotic calcification. No aneurysm. No pathologic adenopathy within the abdomen and pelvis. Reproductive: Uterus and bilateral adnexa are unremarkable. Other: Rectum unremarkable. Musculoskeletal: Lumbar fusion with instrumentation has been performed. No lytic or blastic bone lesions. IMPRESSION: 1. Extraluminal enteric contrast adjacent to the lateral segment of the left hepatic lobe which has increased in volume since prior examination. The exact source of leakage, however, is not clearly identified on this examination. 2. Loculated collection of extraluminal gas adjacent to the terminal ileum. The pigtail drainage catheter is just within this collection, unchanged from prior examination. The volume of gas is increased since prior examination, possibly the result of a flushing of the drainage  catheter or a continued enteric communication. 3. Mild ileus. 4. Improved body wall edema. Aortic Atherosclerosis (ICD10-I70.0). Electronically Signed   By: Fidela Salisbury MD   On: 05/12/2020 21:00        Scheduled Meds: . (feeding supplement) PROSource Plus  30 mL Oral TID BM  . acidophilus  1 capsule Oral Daily  . alteplase  2 mg Intracatheter Once  .  alteplase  2 mg Intracatheter Once  . amiodarone  200 mg Oral BID  . Chlorhexidine Gluconate Cloth  6 each Topical Daily  . docusate sodium  200 mg Oral BID  . rilpivirine  25 mg Oral Q breakfast   And  . dolutegravir  50 mg Oral Q breakfast  . dronabinol  5 mg Oral BID AC  . epoetin (EPOGEN/PROCRIT) injection  10,000 Units Intravenous Q M,W,F-HD  . famotidine  10 mg Oral Q1200  . feeding supplement (NEPRO CARB STEADY)  237 mL Oral TID BM  . guaiFENesin-dextromethorphan  10 mL Oral Q8H  . heparin injection (subcutaneous)  5,000 Units Subcutaneous Q8H  . magnesium oxide  400 mg Oral Daily  . mometasone-formoterol  2 puff Inhalation BID  . multivitamin  1 tablet Oral QHS  . sodium chloride flush  5 mL Intracatheter Q8H  . torsemide  40 mg Oral Daily   Continuous Infusions: . sodium chloride 250 mL (05/03/20 0043)  . sodium chloride 10 mL/hr at 05/12/20 0600  . anidulafungin 100 mg (05/13/20 0504)  . meropenem (MERREM) IV 500 mg (05/13/20 0352)     LOS: 29 days    Time spent: 20 mins     Wyvonnia Dusky, MD [facility] Pager [phone]  If 7PM-7AM, please contact night-coverage [email or web address] 05/13/2020, 8:02 AM

## 2020-05-13 NOTE — TOC Progression Note (Signed)
Transition of Care [facility]) - Progression Note    Patient Details  Name: Melanie Bowen MRN: [id number] Date of Birth: Dec 08, 1960  Transition of Care [facility]) CM/SW Contact  Beverly Sessions, RN Phone Number: 05/13/2020, 2:48 PM  Clinical Narrative:     Per MD patient not medically ready for discharge.         Expected Discharge Plan and Services                                                 Social Determinants of Health (SDOH) Interventions    Readmission Risk Interventions Readmission Risk Prevention Plan 11/08/2019  Transportation Screening Complete  Medication Review Press photographer) Complete  PCP or Specialist appointment within 3-5 days of discharge Not Complete  PCP/Specialist Appt Not Complete comments Her PCP is out for 2-3 weeks so other two providers are booked out. The receptionist will call her if there is a cancellation.  [place] or Home Care Consult Complete  SW Recovery Care/Counseling Consult Complete  Palliative Care Screening Not Applicable  Skilled Nursing Facility Not Applicable  Some recent data might be hidden

## 2020-05-13 NOTE — Progress Notes (Signed)
[facility]  ROUNDING NOTE   Subjective:   Patient found resting in bed, more awake and alert today. No c/o SOB, nausea or vomiting, reports fair appetite and consuming her breakfast.  Objective:  Vital signs in last 24 hours:  Temp:  [97.7 F (36.5 C)-98.6 F (37 C)] 97.7 F (36.5 C) (09/14 0502) Pulse Rate:  [68-153] 107 (09/14 0502) Resp:  [22-34] 24 (09/14 0502) BP: (86-131)/(46-85) 105/66 (09/14 0502) SpO2:  [93 %-100 %] 93 % (09/14 0502)  Weight change:  Filed Weights   05/02/20 0358 05/03/20 0547 05/09/20 0951  Weight: 135.2 kg 130.9 kg 119 kg    Intake/Output: I/O last 3 completed shifts: In: 265.1 [I.V.:30.3; IV Piggyback:234.8] Out: 45 [Drains:30; Other:49]   Intake/Output this shift:  No intake/output data recorded.  Physical Exam: General: In no acute distress  Lungs:  Fine crackles + bilaterally, diminished at the bases  Heart:  Regular rate and rhythm  Abdomen:  Non tender, non distended  Extremities: No peripheral edema   Neurologic: Oriented x 2  Skin No new lesions or rashes noted  Access: RIJ permcath 8/27 Dr. [name]    Basic Metabolic Panel: Recent Labs  Lab 05/07/20 1547 05/07/20 1547 05/08/20 1137 05/08/20 1137 05/09/20 0930 05/09/20 0930 05/10/20 0605 05/10/20 0605 05/11/20 0708 05/12/20 0744 05/13/20 0728  NA 140   < > 139   < > 143  --  141  --  141 140 138  K 3.6   < > 3.7   < > 3.6  --  3.7  --  3.7 3.8 3.5  CL 99   < > 98   < > 101  --  102  --  102 101 101  CO2 28   < > 27   < > 30  --  27  --  28 27 26   GLUCOSE 86   < > 80   < > 85  --  77  --  70 72 86  BUN 60*   < > 54*   < > 60*  --  41*  --  45* 52* 33*  CREATININE 3.81*   < > 3.87*   < > 3.74*  --  2.96*  --  3.45* 3.66* 2.69*  CALCIUM 7.5*   < > 7.3*   < > 7.3*   < > 7.6*   < > 7.5* 7.5* 7.3*  MG  --    < > 1.8  --  2.1  --  2.1  --  2.0 2.0  --   PHOS 5.1*  --   --   --   --   --   --   --   --   --   --    < > = values in this interval not displayed.     Liver Function Tests: Recent Labs  Lab 05/07/20 1547  ALBUMIN 1.2*   No results for input(s): LIPASE, AMYLASE in the last 168 hours. No results for input(s): AMMONIA in the last 168 hours.  CBC: Recent Labs  Lab 05/09/20 0930 05/10/20 0605 05/11/20 0708 05/12/20 0744 05/13/20 0728  WBC 2.8* 2.7* 2.9* 5.4 14.3*  HGB 6.9* 8.5* 7.5* 8.0* 7.3*  HCT 20.4* 26.1* 23.2* 25.1* 22.6*  MCV 89.1 91.3 90.3 91.3 90.8  PLT 380 383 405* 450* 390    Cardiac Enzymes: No results for input(s): CKTOTAL, CKMB, CKMBINDEX, TROPONINI in the last 168 hours.  BNP: Invalid input(s): POCBNP  CBG: No results  for input(s): GLUCAP in the last 168 hours.  Microbiology: Results for orders placed or performed during the hospital encounter of 04/14/20  SARS Coronavirus 2 by RT PCR (hospital order, performed in [facility] hospital lab) Nasopharyngeal Nasopharyngeal Swab     Status: None   Collection Time: 04/14/20  2:17 AM   Specimen: Nasopharyngeal Swab  Result Value Ref Range Status   SARS Coronavirus 2 NEGATIVE NEGATIVE Final    Comment: (NOTE) SARS-CoV-2 target nucleic acids are NOT DETECTED.  The SARS-CoV-2 RNA is generally detectable in upper and lower respiratory specimens during the acute phase of infection. The lowest concentration of SARS-CoV-2 viral copies this assay can detect is 250 copies / mL. A negative result does not preclude SARS-CoV-2 infection and should not be used as the sole basis for treatment or other patient management decisions.  A negative result may occur with improper specimen collection / handling, submission of specimen other than nasopharyngeal swab, presence of viral mutation(s) within the areas targeted by this assay, and inadequate number of viral copies (<250 copies / mL). A negative result must be combined with clinical observations, patient history, and epidemiological information.  Fact Sheet for Patients:    StrictlyIdeas.no  Fact Sheet for Healthcare Providers: BankingDealers.co.za  This test is not yet approved or  cleared by the [place] FDA and has been authorized for detection and/or diagnosis of SARS-CoV-2 by FDA under an Emergency Use Authorization (EUA).  This EUA will remain in effect (meaning this test can be used) for the duration of the COVID-19 declaration under Section 564(b)(1) of the Act, 21 U.S.C. section 360bbb-3(b)(1), unless the authorization is terminated or revoked sooner.  Performed at [facility], [address] [place], [place] [address]   CULTURE, BLOOD (ROUTINE X 2) w Reflex to ID Panel     Status: None   Collection Time: 04/14/20  8:14 AM   Specimen: BLOOD  Result Value Ref Range Status   Specimen Description BLOOD LEFT [facility]  Final   Special Requests   Final    BOTTLES DRAWN AEROBIC AND ANAEROBIC Blood Culture adequate volume   Culture   Final    NO GROWTH 5 DAYS Performed at [facility], [address] [place], [place] [address]    Report Status 04/19/2020 FINAL  Final  CULTURE, BLOOD (ROUTINE X 2) w Reflex to ID Panel     Status: None   Collection Time: 04/14/20  8:14 AM   Specimen: BLOOD  Result Value Ref Range Status   Specimen Description BLOOD LEFT WRIST  Final   Special Requests   Final    BOTTLES DRAWN AEROBIC AND ANAEROBIC Blood Culture adequate volume   Culture   Final    NO GROWTH 5 DAYS Performed at [facility], [address] [place], [place] [address]    Report Status 04/19/2020 FINAL  Final  Aerobic/Anaerobic Culture (surgical/deep wound)     Status: None   Collection Time: 04/14/20 11:14 AM   Specimen: Abscess  Result Value Ref Range Status   Specimen Description   Final    ABSCESS Performed at [facility], [address], [place], [place] [address]    Special Requests   Final    NONE Performed at [facility], [address] [place], [place] [address]    Gram Stain   Final    NO WBC SEEN ABUNDANT GRAM NEGATIVE RODS FEW GRAM POSITIVE COCCI FEW GRAM POSITIVE RODS Performed at [facility], [address], [place], [place] [address]    Culture   Final    ABUNDANT KLEBSIELLA PNEUMONIAE ABUNDANT PSEUDOMONAS AERUGINOSA MODERATE ESCHERICHIA COLI Confirmed Extended Spectrum Beta-Lactamase Producer (ESBL).  In bloodstream infections from ESBL organisms, carbapenems are preferred over piperacillin/tazobactam. They are shown to have a lower risk of mortality. MIXED ANAEROBIC FLORA PRESENT.  CALL LAB IF FURTHER IID REQUIRED.    Report Status 04/20/2020 FINAL  Final   Organism ID, Bacteria KLEBSIELLA PNEUMONIAE  Final   Organism ID, Bacteria PSEUDOMONAS AERUGINOSA  Final   Organism ID, Bacteria ESCHERICHIA COLI  Final      Susceptibility   Escherichia coli - MIC*    AMPICILLIN >=32 RESISTANT Resistant     CEFAZOLIN >=64 RESISTANT Resistant     CEFEPIME 16 RESISTANT Resistant     CEFTAZIDIME RESISTANT Resistant     CEFTRIAXONE >=64 RESISTANT Resistant     CIPROFLOXACIN >=4 RESISTANT Resistant     GENTAMICIN <=1 SENSITIVE Sensitive     IMIPENEM <=0.25 SENSITIVE Sensitive     TRIMETH/SULFA >=320 RESISTANT Resistant     AMPICILLIN/SULBACTAM >=32 RESISTANT Resistant     PIP/TAZO 8 SENSITIVE Sensitive     * MODERATE ESCHERICHIA COLI   Klebsiella pneumoniae - MIC*    AMPICILLIN >=32 RESISTANT Resistant     CEFAZOLIN <=4 SENSITIVE Sensitive     CEFEPIME <=0.12 SENSITIVE Sensitive     CEFTAZIDIME <=1 SENSITIVE Sensitive     CEFTRIAXONE <=0.25 SENSITIVE Sensitive     CIPROFLOXACIN <=0.25 SENSITIVE Sensitive     GENTAMICIN <=1 SENSITIVE Sensitive     IMIPENEM <=0.25 SENSITIVE Sensitive     TRIMETH/SULFA <=20 SENSITIVE Sensitive     AMPICILLIN/SULBACTAM 16 INTERMEDIATE Intermediate     PIP/TAZO 8 SENSITIVE Sensitive     * ABUNDANT KLEBSIELLA PNEUMONIAE   Pseudomonas aeruginosa - MIC*     CEFTAZIDIME 4 SENSITIVE Sensitive     CIPROFLOXACIN <=0.25 SENSITIVE Sensitive     GENTAMICIN 2 SENSITIVE Sensitive     IMIPENEM 2 SENSITIVE Sensitive     PIP/TAZO 8 SENSITIVE Sensitive     CEFEPIME 4 SENSITIVE Sensitive     * ABUNDANT PSEUDOMONAS AERUGINOSA  MRSA PCR Screening     Status: None   Collection Time: 04/14/20  6:19 PM   Specimen: Nasopharyngeal  Result Value Ref Range Status   MRSA by PCR NEGATIVE NEGATIVE Final    Comment:        The GeneXpert MRSA Assay (FDA approved for NASAL specimens only), is one component of a comprehensive MRSA colonization surveillance program. It is not intended to diagnose MRSA infection nor to guide or monitor treatment for MRSA infections. Performed at [facility], [address], [place], [place] [address]   Urine Culture     Status: None   Collection Time: 04/15/20  4:28 AM   Specimen: Urine, Random  Result Value Ref Range Status   Specimen Description   Final    URINE, RANDOM Performed at [facility], [address], [place], [place] [address]    Special Requests   Final    NONE Performed at [facility], [address] [place], [place] [address]    Culture   Final    NO GROWTH Performed at [facility], [place] [address], [place], [place] [address]    Report Status 04/16/2020 FINAL  Final  CULTURE, BLOOD (ROUTINE X 2) w Reflex to ID Panel     Status: None   Collection Time: 04/22/20  6:12 PM   Specimen: BLOOD  Result Value Ref Range Status   Specimen Description BLOOD FOOT  Final   Special Requests   Final    BOTTLES DRAWN AEROBIC AND ANAEROBIC Blood Culture adequate volume   Culture   Final    NO GROWTH 5 DAYS Performed at [facility], [address] [place], [place] [address]    Report Status 04/27/2020 FINAL  Final  CULTURE, BLOOD (ROUTINE X 2) w Reflex to ID Panel     Status: None   Collection Time: 04/22/20  6:17 PM   Specimen: BLOOD  Result Value Ref Range  Status   Specimen Description BLOOD FOOT  Final   Special Requests   Final    BOTTLES DRAWN AEROBIC AND ANAEROBIC Blood Culture results may not be optimal due to an excessive volume of blood received in culture bottles   Culture   Final    NO GROWTH 5 DAYS Performed at [facility], [address] [place], [place] [address]    Report Status 04/27/2020 FINAL  Final  CULTURE, BLOOD (ROUTINE X 2) w Reflex to ID Panel     Status: None   Collection Time: 05/01/20  2:50 PM   Specimen: BLOOD  Result Value Ref Range Status   Specimen Description BLOOD BLOOD LEFT HAND  Final   Special Requests   Final    BOTTLES DRAWN AEROBIC AND ANAEROBIC Blood Culture adequate volume   Culture   Final    NO GROWTH 5 DAYS Performed at [facility], [address] [place], [place] [address]    Report Status 05/06/2020 FINAL  Final  CULTURE, BLOOD (ROUTINE X 2) w Reflex to ID Panel     Status: None   Collection Time: 05/01/20  2:51 PM   Specimen: BLOOD  Result Value Ref Range Status   Specimen Description BLOOD BLOOD RIGHT HAND  Final   Special Requests   Final    BOTTLES DRAWN AEROBIC AND ANAEROBIC Blood Culture adequate volume   Culture   Final    NO GROWTH 5 DAYS Performed at [facility], [address] [place], [place] [address]    Report Status 05/06/2020 FINAL  Final    Coagulation Studies: No results for input(s): LABPROT, INR in the last 72 hours.  Urinalysis: No results for input(s): COLORURINE, LABSPEC, PHURINE, GLUCOSEU, HGBUR, BILIRUBINUR, KETONESUR, PROTEINUR, UROBILINOGEN, NITRITE, LEUKOCYTESUR in the last 72 hours.  Invalid input(s): APPERANCEUR    Imaging: CT ABDOMEN PELVIS WO CONTRAST  Result Date: 05/12/2020 CLINICAL DATA:  Intra-abdominal abscess, fever EXAM: CT ABDOMEN AND PELVIS WITHOUT CONTRAST TECHNIQUE: Multidetector CT imaging of the abdomen and pelvis was performed following the standard protocol without IV contrast. COMPARISON:  None.  FINDINGS: Lower chest: The visualized lung bases are clear bilaterally. Cardiac blood pool is of relatively low attenuation in keeping with at least moderate anemia. Global cardiac size within normal limits. Hepatobiliary: Hyperdensity of the gallbladder contents likely represents vicarious excretion of contrast. The gallbladder and liver are otherwise unremarkable. No intra or extrahepatic biliary ductal dilation. Pancreas: Unremarkable Spleen: Unremarkable Adrenals/Urinary Tract: Unremarkable Stomach/Bowel: There is extraluminal enteric contrast identified adjacent to the lateral segment of the left hepatic lobe which has increased in volume since prior examination. The exact source of leakage, however, is not clearly identified on this examination. The small bowel is diffusely mildly dilated containing both gas and enteric contrast in keeping with changes of a mild ileus. Oral contrast, however, does appear to extend  into the terminal ileum. Adjacent to the terminal ileum is a loculated collection of extraluminal gas measuring 6.7 x 2.5 cm. The pigtail drainage catheter is just within this collection, unchanged from prior examination. The volume of gas is increased since prior examination, possibly the result of a flushing of the drainage catheter or a a continued enteric communication. There is extensive descending and sigmoid colonic diverticulosis without superimposed inflammatory change. Large bowel otherwise unremarkable. Appendix normal. There is mild ascites present, similar in volume to prior examination. Diffuse body wall edema has improved in the interval since prior examination. Vascular/Lymphatic: The abdominal vasculature is age-appropriate with moderate aortoiliac atherosclerotic calcification. No aneurysm. No pathologic adenopathy within the abdomen and pelvis. Reproductive: Uterus and bilateral adnexa are unremarkable. Other: Rectum unremarkable. Musculoskeletal: Lumbar fusion with instrumentation  has been performed. No lytic or blastic bone lesions. IMPRESSION: 1. Extraluminal enteric contrast adjacent to the lateral segment of the left hepatic lobe which has increased in volume since prior examination. The exact source of leakage, however, is not clearly identified on this examination. 2. Loculated collection of extraluminal gas adjacent to the terminal ileum. The pigtail drainage catheter is just within this collection, unchanged from prior examination. The volume of gas is increased since prior examination, possibly the result of a flushing of the drainage catheter or a continued enteric communication. 3. Mild ileus. 4. Improved body wall edema. Aortic Atherosclerosis (ICD10-I70.0). Electronically Signed   By: Fidela Salisbury MD   On: 05/12/2020 21:00     Medications:   . sodium chloride 250 mL (05/03/20 0043)  . sodium chloride 10 mL/hr at 05/12/20 0600  . anidulafungin 100 mg (05/13/20 0504)  . meropenem (MERREM) IV 500 mg (05/13/20 0352)   . (feeding supplement) PROSource Plus  30 mL Oral TID BM  . acidophilus  1 capsule Oral Daily  . alteplase  2 mg Intracatheter Once  . alteplase  2 mg Intracatheter Once  . amiodarone  200 mg Oral BID  . Chlorhexidine Gluconate Cloth  6 each Topical Daily  . docusate sodium  200 mg Oral BID  . rilpivirine  25 mg Oral Q breakfast   And  . dolutegravir  50 mg Oral Q breakfast  . dronabinol  5 mg Oral BID AC  . epoetin (EPOGEN/PROCRIT) injection  10,000 Units Intravenous Q M,W,F-HD  . famotidine  10 mg Oral Q1200  . feeding supplement (NEPRO CARB STEADY)  237 mL Oral TID BM  . guaiFENesin-dextromethorphan  10 mL Oral Q8H  . heparin injection (subcutaneous)  5,000 Units Subcutaneous Q8H  . magnesium oxide  400 mg Oral Daily  . mometasone-formoterol  2 puff Inhalation BID  . multivitamin  1 tablet Oral QHS  . sodium chloride flush  5 mL Intracatheter Q8H  . torsemide  40 mg Oral Daily   sodium chloride, acetaminophen **OR** acetaminophen,  albuterol, ALPRAZolam, bisacodyl, diphenhydrAMINE, heparin NICU/SCN flush, HYDROmorphone (DILAUDID) injection, iohexol, ipratropium-albuterol, magic mouthwash **AND** lidocaine, magnesium hydroxide, menthol-cetylpyridinium, metoprolol tartrate, oxyCODONE-acetaminophen, phenol, simethicone  Assessment/ Plan:  Melanie Bowen is a 59 y.o. black female with HIV, COPD, hypertension, GERD, who is admitted to [facility] on 04/14/2020 for Dyspnea [R06.00] Colonic diverticular abscess [K57.20] Diverticulitis of large intestine with abscess without bleeding [K57.20] Abscess of sigmoid colon due to diverticulitis [K57.20]  #Acute renal failure   -Received dialysis yesterday - Will continue MWF schedule -No indication for additional dialysis  Lab Results  Component Value Date   CREATININE 2.69 (H) 05/13/2020   CREATININE 3.66 (H)  05/12/2020   CREATININE 3.45 (H) 05/11/2020   Outpatient chair at [place] MWF schedule.   #. Anemia of chronic kidney disease:  Lab Results  Component Value Date   HGB 7.3 (L) 05/13/2020  Hgb relatively stable, will continue monitoring CBC Continue Epogen with dialysis treatments  #Fever Receiving Anidulafungin (started on 05/03/20) meropenem IV (started on 05/10/20) Afebrile now   # Hypotension BP readings within acceptable range Will continue monitoring      LOS: 29 Tayshaun Kroh 9/14/202110:17 AM

## 2020-05-13 NOTE — Treatment Plan (Signed)
This Probation officer assisted with patients bath this morning. Pt had large bowel movement. Complete linen change, gown changed, bed pad changed. CHG wiped after wash with soap and water.

## 2020-05-13 NOTE — Progress Notes (Signed)
Occupational Therapy Treatment Patient Details Name: NORLENE LANES MRN: [id number] DOB: Jan 15, 1961 Today's Date: 05/13/2020    History of present illness Brihana Quickel is a 59yoF who comes to [facility] on 8/16 c LLQ ABD pain. Pt admitted c Acute sigmoid diverticulitis with diverticular abscess. PMH: COPD, asthma, HTN, diverticulosis, HIV, GERD, depression, bilat TKA, 3 level lumbar fusion, susequent lumbar hardware removal. Pt moved to ICU early in stay, inititated on HD on 04/16/20, temp cath removed 8/25. Pt  has had AF RVR issues. Pt also reports a subacute Rt rotator cuff injury with significant pain and disability, scheduled for repair with [name] in mid September.   OT comments  Pt received semi-supine in bed. She initially declines to participate in OT services as she has for multiple sessions in a row. Pt noted to be soiled with BM, and is agreeable to therapist assisting with clean up. OT/NT facilitate bed level toileting/linnen change. Pt participates minimally throughout session, but is able to reach with her LUE intermittently to hold onto bed rail during rolling onto her R side. Pt yells out, "why don't y'all listen!" during care and requires +2 total assist for peri-care, and rolling to L side this date. +2 total assist for bed boost. Once pt re-positioned in bed therapist attempts to engage her in bed-level self-feeding (per RN pt has been refusing eating/drinking this date). Pt states, "leave me alone", and adamantly declines any further ADL management despite education/encouragement from therapist. Pt not progressing toward goals for bed-level care. Given pt minimal participation in OT services and lack of progress toward functional goals, OT to sign off at this time. Please re-consult if additional OT needs arise during this admission and/or pt demonstrates improved interest in participation in therapy services. Upon hospital DC, pt may be most appropriate for [facility] setting.   Follow  Up Recommendations  LTACH;SNF (SNF if pt participation in therapy improves. May be more appropriate for [facility] setting.)    Equipment Recommendations  Other (comment) (Defer to next venue of care.)    Recommendations for Other Services      Precautions / Restrictions Restrictions Weight Bearing Restrictions: No       Mobility Bed Mobility Overal bed mobility: Needs Assistance Bed Mobility: Rolling Rolling: Max assist;+2 for physical assistance;Total assist         General bed mobility comments: Max-TOTAL A for rolling in bed. Pt intermittently able to reach out with LUE to hold bed rail briefly, but does not maintain.  Transfers                 General transfer comment: Deferred. Unsafe/unable    Balance       Sitting balance - Comments: Deferred       Standing balance comment: Deferred                           ADL either performed or assessed with clinical judgement   ADL Overall ADL's : Needs assistance/impaired                             Toileting- Clothing Manipulation and Hygiene: Bed level;Total assistance;+2 for physical assistance Toileting - Clothing Manipulation Details (indicate cue type and reason): Pt dependent for bed-level toileting this date. She consistently refuses to participate in care. Yells out, "why don't yall listen" when assisted with peri-care management/linnen change. Initially declines she has had a bowel movement,  but upon attempt to reposition is found to be soiled and requires total assist +2 with bed-level clean up.       General ADL Comments: Pt continues to consistently refuse EOB/OOB activity, on this date initially refusing all bed-level care as well, but when found to be soiled with BM agreeable to clean-up. Per nsg/chart, pt declines to participate in any ADL management, and on this date has reclined to participate in eating/drinking.     Vision Baseline Vision/History: Wears glasses Wears  Glasses: Reading only Patient Visual Report: No change from baseline     Perception     Praxis      Cognition Arousal/Alertness: Lethargic Behavior During Therapy: Flat affect Overall Cognitive Status: No family/caregiver present to determine baseline cognitive functioning                                 General Comments: Pt appears at least minimally confused. Is observed to yell out during session, and pull on LLQ jp drain, requires prompting to stop. States "It's open!" when re-directed by staff. No family/caregiver present to determine baseline.        Exercises Other Exercises Other Exercises: OT facilitates rolling, bed level toileting/peri-care this date. Pt declines any further ADL management despite encouragement/education. States "Leave me alone".   Shoulder Instructions       General Comments IJ drain in place at start/end of session.    Pertinent Vitals/ Pain       Pain Assessment: Faces Faces Pain Scale: Hurts even more Pain Location: BLEs/sacral region (wound)  Pain Descriptors / Indicators: Discomfort;Grimacing;Guarding Pain Intervention(s): Limited activity within patient's tolerance;Monitored during session;Repositioned  Home Living                                          Prior Functioning/Environment              Frequency           Progress Toward Goals  OT Goals(current goals can now be found in the care plan section)  Progress towards OT goals: Not progressing toward goals - comment (Goals not met, pt consistently declines to participate in OT services. Pt discharged from OT at this time.)  Acute Rehab OT Goals Patient Stated Goal: go home OT Goal Formulation: All assessment and education complete, DC therapy  Plan Discharge plan needs to be updated;Other (comment) (Pt consistently declines to participate in OT services. No progress made on goals since evaluation. DC from services.)     Co-evaluation                 AM-PAC OT "6 Clicks" Daily Activity     Outcome Measure   Help from another person eating meals?: A Lot Help from another person taking care of personal grooming?: Total Help from another person toileting, which includes using toliet, bedpan, or urinal?: Total Help from another person bathing (including washing, rinsing, drying)?: Total Help from another person to put on and taking off regular upper body clothing?: A Lot Help from another person to put on and taking off regular lower body clothing?: Total 6 Click Score: 8    End of Session    OT Visit Diagnosis: Muscle weakness (generalized) (M62.81)   Activity Tolerance Patient limited by lethargy (Limited by pt partcipation)   Patient Left in  bed;with call bell/phone within reach;with bed alarm set   Nurse Communication Other (comment) (Pt found to be soiled, cleaned of BM now resting comfortably, continues to decline eating)        Time: 1359-1416 OT Time Calculation (min): 17 min  Charges: OT General Charges $OT Visit: 1 Visit OT Treatments $Self Care/Home Management : 8-22 mins  Shara Blazing, M.S., OTR/L Ascom: [phone] 05/13/20, 2:57 PM

## 2020-05-13 NOTE — Progress Notes (Signed)
Nutrition Follow-up  DOCUMENTATION CODES:   Morbid obesity  INTERVENTION:   Change to Ensure Enlive po TID, each supplement provides 350 kcal and 20 grams of protein  Add Magic cup TID with meals, each supplement provides 290 kcal and 9 grams of protein  Continue Rena-vit daily  Add MVI daily   Dysphagia 3 diet   Assist with meals   NUTRITION DIAGNOSIS:   Increased nutrient needs related to chronic illness (COPD, new HD) as evidenced by estimated needs. -ongoing  GOAL:   Patient will meet greater than or equal to 90% of their needs -not met   MONITOR:   PO intake, Supplement acceptance, Labs, Weight trends, Skin, I & O's  ASSESSMENT:   59 y/o female with h/o HIV, COPD and HTN who is admitted with septic shock secondary to perforated diverticulitis and peridiverticular abscess s/p JP drain on 04/14/20 and AKI requiring temporary HD  8/18- first HD treatment  8/27 - RIJ permacath placed  Pt continues to have poor appetite and oral intake; pt eating only bites and sips of meals. Pt is refusing all of her supplements. Appetite stimulant started 9/11. Spoke with MD, pt would not likely tolerate nasogastric tube placement and tube feeds as she would likely remove tube. Pt would also likely remove PICC line for TPN. RD will change Nepro to Ensure and add daily MVI. RD will also add Magic Cups to meal trays. Pt remains at high refeed risk.   Per chart, pt is down 6lbs from her UBW.   Medications reviewed and include: risaquad, marinol, epoetin, pepcid, heparin, Mg oxide, rena-vite, torsemide, meropenem  Labs reviewed: creat 2.69(H) Wbc- 14.3(H), Hgb 7.3(L), Hct 22.6(L)  Diet Order:   Diet Order            DIET DYS 3 Room service appropriate? No; Fluid consistency: Thin  Diet effective now                EDUCATION NEEDS:   No education needs have been identified at this time  Skin:  Skin Assessment: Reviewed RN Assessment (ecchymosis, MASD, incision  abdomen)  Last BM:  9/14- type 5  Height:   Ht Readings from Last 1 Encounters:  04/22/20 $RemoveB'5\' 6"'fMeXwSvC$  (1.676 m)    Weight:   Wt Readings from Last 1 Encounters:  05/09/20 119 kg    Ideal Body Weight:  59 kg  BMI:  Body mass index is 42.34 kg/m.  Estimated Nutritional Needs:   Kcal:  2300-2600kcal/day  Protein:  >120g/day  Fluid:  UOP +1L  Koleen Distance MS, RD, LDN Please refer to [facility] for RD and/or RD on-call/weekend/after hours pager

## 2020-05-14 DIAGNOSIS — G9341 Metabolic encephalopathy: Secondary | ICD-10-CM | POA: Diagnosis not present

## 2020-05-14 DIAGNOSIS — A419 Sepsis, unspecified organism: Secondary | ICD-10-CM | POA: Diagnosis not present

## 2020-05-14 DIAGNOSIS — R6521 Severe sepsis with septic shock: Secondary | ICD-10-CM | POA: Diagnosis not present

## 2020-05-14 DIAGNOSIS — K5720 Diverticulitis of large intestine with perforation and abscess without bleeding: Secondary | ICD-10-CM | POA: Diagnosis not present

## 2020-05-14 LAB — CBC
HCT: 22.4 % — ABNORMAL LOW (ref 36.0–46.0)
Hemoglobin: 7.4 g/dL — ABNORMAL LOW (ref 12.0–15.0)
MCH: 30.2 pg (ref 26.0–34.0)
MCHC: 33 g/dL (ref 30.0–36.0)
MCV: 91.4 fL (ref 80.0–100.0)
Platelets: 381 10*3/uL (ref 150–400)
RBC: 2.45 MIL/uL — ABNORMAL LOW (ref 3.87–5.11)
RDW: 18.4 % — ABNORMAL HIGH (ref 11.5–15.5)
WBC: 19.9 10*3/uL — ABNORMAL HIGH (ref 4.0–10.5)
nRBC: 0.8 % — ABNORMAL HIGH (ref 0.0–0.2)

## 2020-05-14 LAB — BASIC METABOLIC PANEL
Anion gap: 12 (ref 5–15)
BUN: 39 mg/dL — ABNORMAL HIGH (ref 6–20)
CO2: 25 mmol/L (ref 22–32)
Calcium: 7.4 mg/dL — ABNORMAL LOW (ref 8.9–10.3)
Chloride: 101 mmol/L (ref 98–111)
Creatinine, Ser: 2.91 mg/dL — ABNORMAL HIGH (ref 0.44–1.00)
GFR calc Af Amer: 20 mL/min — ABNORMAL LOW (ref >60)
GFR calc non Af Amer: 17 mL/min — ABNORMAL LOW (ref >60)
Glucose, Bld: 82 mg/dL (ref 70–99)
Potassium: 3.8 mmol/L (ref 3.5–5.1)
Sodium: 138 mmol/L (ref 135–145)

## 2020-05-14 LAB — HEPATIC FUNCTION PANEL
ALT: 23 U/L (ref 0–44)
AST: 25 U/L (ref 15–41)
Albumin: 1.1 g/dL — ABNORMAL LOW (ref 3.5–5.0)
Alkaline Phosphatase: 78 U/L (ref 38–126)
Bilirubin, Direct: 0.4 mg/dL — ABNORMAL HIGH (ref 0.0–0.2)
Indirect Bilirubin: 0.9 mg/dL (ref 0.3–0.9)
Total Bilirubin: 1.3 mg/dL — ABNORMAL HIGH (ref 0.3–1.2)
Total Protein: 5.3 g/dL — ABNORMAL LOW (ref 6.5–8.1)

## 2020-05-14 NOTE — Progress Notes (Signed)
[facility]  ROUNDING NOTE   Subjective:   Patient seen and evaluated during dialysis treatment.  Sitting up in chair at the moment. Still confused.  Objective:  Vital signs in last 24 hours:  Temp:  [97.5 F (36.4 C)-99.1 F (37.3 C)] 99.1 F (37.3 C) (09/15 1657) Pulse Rate:  [95-105] 98 (09/15 0633) Resp:  [20-24] 20 (09/15 9038) BP: (99-121)/(53-79) 99/79 (09/15 3338) SpO2:  [93 %-100 %] 95 % (09/15 0633)  Weight change:  Filed Weights   05/02/20 0358 05/03/20 0547 05/09/20 0951  Weight: 135.2 kg 130.9 kg 119 kg    Intake/Output: I/O last 3 completed shifts: In: 613.4 [P.O.:510; I.V.:3.4; IV Piggyback:100] Out: 60 [Drains:60]   Intake/Output this shift:  No intake/output data recorded.  Physical Exam: General: In no acute distress  Lungs:   Bilateral rales, normal effort  Heart: Regular rate and rhythm  Abdomen:  Non tender, non distended  Extremities: No peripheral edema   Neurologic: Awake, alert but confused  Skin No new lesions or rashes noted  Access: RIJ permcath 8/27 Dr. [name]    Basic Metabolic Panel: Recent Labs  Lab 05/07/20 1547 05/07/20 1547 05/08/20 1137 05/08/20 1137 05/09/20 0930 05/09/20 0930 05/10/20 0605 05/10/20 3291 05/11/20 0708 05/11/20 0708 05/12/20 0744 05/13/20 0728 05/14/20 0604  NA 140   < > 139   < > 143   < > 141  --  141  --  140 138 138  K 3.6   < > 3.7   < > 3.6   < > 3.7  --  3.7  --  3.8 3.5 3.8  CL 99   < > 98   < > 101   < > 102  --  102  --  101 101 101  CO2 28   < > 27   < > 30   < > 27  --  28  --  27 26 25   GLUCOSE 86   < > 80   < > 85   < > 77  --  70  --  72 86 82  BUN 60*   < > 54*   < > 60*   < > 41*  --  45*  --  52* 33* 39*  CREATININE 3.81*   < > 3.87*   < > 3.74*   < > 2.96*  --  3.45*  --  3.66* 2.69* 2.91*  CALCIUM 7.5*   < > 7.3*   < > 7.3*   < > 7.6*   < > 7.5*   < > 7.5* 7.3* 7.4*  MG  --    < > 1.8  --  2.1  --  2.1  --  2.0  --  2.0  --   --   PHOS 5.1*  --   --   --   --   --    --   --   --   --   --   --   --    < > = values in this interval not displayed.    Liver Function Tests: Recent Labs  Lab 05/07/20 1547  ALBUMIN 1.2*   No results for input(s): LIPASE, AMYLASE in the last 168 hours. No results for input(s): AMMONIA in the last 168 hours.  CBC: Recent Labs  Lab 05/10/20 0605 05/11/20 0708 05/12/20 0744 05/13/20 0728 05/14/20 0604  WBC 2.7* 2.9* 5.4 14.3* 19.9*  HGB 8.5* 7.5* 8.0* 7.3* 7.4*  HCT 26.1* 23.2* 25.1* 22.6* 22.4*  MCV 91.3 90.3 91.3 90.8 91.4  PLT 383 405* 450* 390 381    Cardiac Enzymes: No results for input(s): CKTOTAL, CKMB, CKMBINDEX, TROPONINI in the last 168 hours.  BNP: Invalid input(s): POCBNP  CBG: No results for input(s): GLUCAP in the last 168 hours.  Microbiology: Results for orders placed or performed during the hospital encounter of 04/14/20  SARS Coronavirus 2 by RT PCR (hospital order, performed in [facility] hospital lab) Nasopharyngeal Nasopharyngeal Swab     Status: None   Collection Time: 04/14/20  2:17 AM   Specimen: Nasopharyngeal Swab  Result Value Ref Range Status   SARS Coronavirus 2 NEGATIVE NEGATIVE Final    Comment: (NOTE) SARS-CoV-2 target nucleic acids are NOT DETECTED.  The SARS-CoV-2 RNA is generally detectable in upper and lower respiratory specimens during the acute phase of infection. The lowest concentration of SARS-CoV-2 viral copies this assay can detect is 250 copies / mL. A negative result does not preclude SARS-CoV-2 infection and should not be used as the sole basis for treatment or other patient management decisions.  A negative result may occur with improper specimen collection / handling, submission of specimen other than nasopharyngeal swab, presence of viral mutation(s) within the areas targeted by this assay, and inadequate number of viral copies (<250 copies / mL). A negative result must be combined with clinical observations, patient history, and epidemiological  information.  Fact Sheet for Patients:   StrictlyIdeas.no  Fact Sheet for Healthcare Providers: BankingDealers.co.za  This test is not yet approved or  cleared by the [place] FDA and has been authorized for detection and/or diagnosis of SARS-CoV-2 by FDA under an Emergency Use Authorization (EUA).  This EUA will remain in effect (meaning this test can be used) for the duration of the COVID-19 declaration under Section 564(b)(1) of the Act, 21 U.S.C. section 360bbb-3(b)(1), unless the authorization is terminated or revoked sooner.  Performed at [facility], [address] [place], [place] [address]   CULTURE, BLOOD (ROUTINE X 2) w Reflex to ID Panel     Status: None   Collection Time: 04/14/20  8:14 AM   Specimen: BLOOD  Result Value Ref Range Status   Specimen Description BLOOD LEFT [facility]  Final   Special Requests   Final    BOTTLES DRAWN AEROBIC AND ANAEROBIC Blood Culture adequate volume   Culture   Final    NO GROWTH 5 DAYS Performed at [facility], [address] [place], [place] [address]    Report Status 04/19/2020 FINAL  Final  CULTURE, BLOOD (ROUTINE X 2) w Reflex to ID Panel     Status: None   Collection Time: 04/14/20  8:14 AM   Specimen: BLOOD  Result Value Ref Range Status   Specimen Description BLOOD LEFT WRIST  Final   Special Requests   Final    BOTTLES DRAWN AEROBIC AND ANAEROBIC Blood Culture adequate volume   Culture   Final    NO GROWTH 5 DAYS Performed at [facility], [address] [place], [place] [address]    Report Status 04/19/2020 FINAL  Final  Aerobic/Anaerobic Culture (surgical/deep wound)     Status: None   Collection Time: 04/14/20 11:14 AM   Specimen: Abscess  Result Value Ref Range Status   Specimen Description   Final    ABSCESS Performed at [facility], [address] [place],  [address]    Special Requests   Final  NONE Performed at [facility], [place], [place] [address]    Gram Stain   Final    NO WBC SEEN ABUNDANT GRAM NEGATIVE RODS FEW GRAM POSITIVE COCCI FEW GRAM POSITIVE RODS Performed at [facility], [place] [address] [place], [place] [address]    Culture   Final    ABUNDANT KLEBSIELLA PNEUMONIAE ABUNDANT PSEUDOMONAS AERUGINOSA MODERATE ESCHERICHIA COLI Confirmed Extended Spectrum Beta-Lactamase Producer (ESBL).  In bloodstream infections from ESBL organisms, carbapenems are preferred over piperacillin/tazobactam. They are shown to have a lower risk of mortality. MIXED ANAEROBIC FLORA PRESENT.  CALL LAB IF FURTHER IID REQUIRED.    Report Status 04/20/2020 FINAL  Final   Organism ID, Bacteria KLEBSIELLA PNEUMONIAE  Final   Organism ID, Bacteria PSEUDOMONAS AERUGINOSA  Final   Organism ID, Bacteria ESCHERICHIA COLI  Final      Susceptibility   Escherichia coli - MIC*    AMPICILLIN >=32 RESISTANT Resistant     CEFAZOLIN >=64 RESISTANT Resistant     CEFEPIME 16 RESISTANT Resistant     CEFTAZIDIME RESISTANT Resistant     CEFTRIAXONE >=64 RESISTANT Resistant     CIPROFLOXACIN >=4 RESISTANT Resistant     GENTAMICIN <=1 SENSITIVE Sensitive     IMIPENEM <=0.25 SENSITIVE Sensitive     TRIMETH/SULFA >=320 RESISTANT Resistant     AMPICILLIN/SULBACTAM >=32 RESISTANT Resistant     PIP/TAZO 8 SENSITIVE Sensitive     * MODERATE ESCHERICHIA COLI   Klebsiella pneumoniae - MIC*    AMPICILLIN >=32 RESISTANT Resistant     CEFAZOLIN <=4 SENSITIVE Sensitive     CEFEPIME <=0.12 SENSITIVE Sensitive     CEFTAZIDIME <=1 SENSITIVE Sensitive     CEFTRIAXONE <=0.25 SENSITIVE Sensitive     CIPROFLOXACIN <=0.25 SENSITIVE Sensitive     GENTAMICIN <=1 SENSITIVE Sensitive     IMIPENEM <=0.25 SENSITIVE Sensitive     TRIMETH/SULFA <=20 SENSITIVE Sensitive     AMPICILLIN/SULBACTAM 16 INTERMEDIATE Intermediate     PIP/TAZO 8 SENSITIVE Sensitive     * ABUNDANT KLEBSIELLA PNEUMONIAE    Pseudomonas aeruginosa - MIC*    CEFTAZIDIME 4 SENSITIVE Sensitive     CIPROFLOXACIN <=0.25 SENSITIVE Sensitive     GENTAMICIN 2 SENSITIVE Sensitive     IMIPENEM 2 SENSITIVE Sensitive     PIP/TAZO 8 SENSITIVE Sensitive     CEFEPIME 4 SENSITIVE Sensitive     * ABUNDANT PSEUDOMONAS AERUGINOSA  MRSA PCR Screening     Status: None   Collection Time: 04/14/20  6:19 PM   Specimen: Nasopharyngeal  Result Value Ref Range Status   MRSA by PCR NEGATIVE NEGATIVE Final    Comment:        The GeneXpert MRSA Assay (FDA approved for NASAL specimens only), is one component of a comprehensive MRSA colonization surveillance program. It is not intended to diagnose MRSA infection nor to guide or monitor treatment for MRSA infections. Performed at [facility], [address], [place], [place] [address]   Urine Culture     Status: None   Collection Time: 04/15/20  4:28 AM   Specimen: Urine, Random  Result Value Ref Range Status   Specimen Description   Final    URINE, RANDOM Performed at [facility], [address] [place], [place] [address]    Special Requests   Final    NONE Performed at [facility], [address], [place], [place] [address]    Culture   Final    NO GROWTH  Performed at [facility], [place] [address], [place], [place] [address]    Report Status 04/16/2020 FINAL  Final  CULTURE, BLOOD (ROUTINE X 2) w Reflex to ID Panel     Status: None   Collection Time: 04/22/20  6:12 PM   Specimen: BLOOD  Result Value Ref Range Status   Specimen Description BLOOD FOOT  Final   Special Requests   Final    BOTTLES DRAWN AEROBIC AND ANAEROBIC Blood Culture adequate volume   Culture   Final    NO GROWTH 5 DAYS Performed at [facility], [address] [place], [place] [address]    Report Status 04/27/2020 FINAL  Final  CULTURE, BLOOD (ROUTINE X 2) w Reflex to ID Panel     Status: None   Collection Time: 04/22/20  6:17 PM   Specimen:  BLOOD  Result Value Ref Range Status   Specimen Description BLOOD FOOT  Final   Special Requests   Final    BOTTLES DRAWN AEROBIC AND ANAEROBIC Blood Culture results may not be optimal due to an excessive volume of blood received in culture bottles   Culture   Final    NO GROWTH 5 DAYS Performed at [facility], [address] [place], [place] [address]    Report Status 04/27/2020 FINAL  Final  CULTURE, BLOOD (ROUTINE X 2) w Reflex to ID Panel     Status: None   Collection Time: 05/01/20  2:50 PM   Specimen: BLOOD  Result Value Ref Range Status   Specimen Description BLOOD BLOOD LEFT HAND  Final   Special Requests   Final    BOTTLES DRAWN AEROBIC AND ANAEROBIC Blood Culture adequate volume   Culture   Final    NO GROWTH 5 DAYS Performed at [facility], [address] [place], [place] [address]    Report Status 05/06/2020 FINAL  Final  CULTURE, BLOOD (ROUTINE X 2) w Reflex to ID Panel     Status: None   Collection Time: 05/01/20  2:51 PM   Specimen: BLOOD  Result Value Ref Range Status   Specimen Description BLOOD BLOOD RIGHT HAND  Final   Special Requests   Final    BOTTLES DRAWN AEROBIC AND ANAEROBIC Blood Culture adequate volume   Culture   Final    NO GROWTH 5 DAYS Performed at [facility], [address] [place], [place] [address]    Report Status 05/06/2020 FINAL  Final    Coagulation Studies: No results for input(s): LABPROT, INR in the last 72 hours.  Urinalysis: No results for input(s): COLORURINE, LABSPEC, PHURINE, GLUCOSEU, HGBUR, BILIRUBINUR, KETONESUR, PROTEINUR, UROBILINOGEN, NITRITE, LEUKOCYTESUR in the last 72 hours.  Invalid input(s): APPERANCEUR    Imaging: CT ABDOMEN PELVIS WO CONTRAST  Result Date: 05/12/2020 CLINICAL DATA:  Intra-abdominal abscess, fever EXAM: CT ABDOMEN AND PELVIS WITHOUT CONTRAST TECHNIQUE: Multidetector CT imaging of the abdomen and pelvis was performed following the standard protocol without IV  contrast. COMPARISON:  None. FINDINGS: Lower chest: The visualized lung bases are clear bilaterally. Cardiac blood pool is of relatively low attenuation in keeping with at least moderate anemia. Global cardiac size within normal limits. Hepatobiliary: Hyperdensity of the gallbladder contents likely represents vicarious excretion of contrast. The gallbladder and liver are otherwise unremarkable. No intra or extrahepatic biliary ductal dilation. Pancreas: Unremarkable Spleen: Unremarkable Adrenals/Urinary Tract: Unremarkable Stomach/Bowel: There is extraluminal enteric contrast identified adjacent to the lateral segment of the left hepatic lobe which has increased in  volume since prior examination. The exact source of leakage, however, is not clearly identified on this examination. The small bowel is diffusely mildly dilated containing both gas and enteric contrast in keeping with changes of a mild ileus. Oral contrast, however, does appear to extend into the terminal ileum. Adjacent to the terminal ileum is a loculated collection of extraluminal gas measuring 6.7 x 2.5 cm. The pigtail drainage catheter is just within this collection, unchanged from prior examination. The volume of gas is increased since prior examination, possibly the result of a flushing of the drainage catheter or a a continued enteric communication. There is extensive descending and sigmoid colonic diverticulosis without superimposed inflammatory change. Large bowel otherwise unremarkable. Appendix normal. There is mild ascites present, similar in volume to prior examination. Diffuse body wall edema has improved in the interval since prior examination. Vascular/Lymphatic: The abdominal vasculature is age-appropriate with moderate aortoiliac atherosclerotic calcification. No aneurysm. No pathologic adenopathy within the abdomen and pelvis. Reproductive: Uterus and bilateral adnexa are unremarkable. Other: Rectum unremarkable. Musculoskeletal:  Lumbar fusion with instrumentation has been performed. No lytic or blastic bone lesions. IMPRESSION: 1. Extraluminal enteric contrast adjacent to the lateral segment of the left hepatic lobe which has increased in volume since prior examination. The exact source of leakage, however, is not clearly identified on this examination. 2. Loculated collection of extraluminal gas adjacent to the terminal ileum. The pigtail drainage catheter is just within this collection, unchanged from prior examination. The volume of gas is increased since prior examination, possibly the result of a flushing of the drainage catheter or a continued enteric communication. 3. Mild ileus. 4. Improved body wall edema. Aortic Atherosclerosis (ICD10-I70.0). Electronically Signed   By: Fidela Salisbury MD   On: 05/12/2020 21:00     Medications:   . sodium chloride 250 mL (05/03/20 0043)  . sodium chloride Stopped (05/12/20 0620)  . anidulafungin 100 mg (05/14/20 0348)  . meropenem (MERREM) IV 500 mg (05/14/20 0302)   . acidophilus  1 capsule Oral Daily  . alteplase  2 mg Intracatheter Once  . alteplase  2 mg Intracatheter Once  . amiodarone  200 mg Oral BID  . Chlorhexidine Gluconate Cloth  6 each Topical Daily  . rilpivirine  25 mg Oral Q breakfast   And  . dolutegravir  50 mg Oral Q breakfast  . dronabinol  5 mg Oral BID AC  . epoetin (EPOGEN/PROCRIT) injection  10,000 Units Intravenous Q M,W,F-HD  . famotidine  10 mg Oral Q1200  . feeding supplement (ENSURE ENLIVE)  237 mL Oral TID BM  . guaiFENesin-dextromethorphan  10 mL Oral Q8H  . heparin injection (subcutaneous)  5,000 Units Subcutaneous Q8H  . magnesium oxide  400 mg Oral Daily  . mometasone-formoterol  2 puff Inhalation BID  . multivitamin  1 tablet Oral QHS  . multivitamin with minerals  1 tablet Oral Daily  . sodium chloride flush  5 mL Intracatheter Q8H  . torsemide  40 mg Oral Daily   sodium chloride, acetaminophen **OR** acetaminophen, albuterol,  ALPRAZolam, bisacodyl, diphenhydrAMINE, docusate sodium, heparin NICU/SCN flush, HYDROmorphone (DILAUDID) injection, iohexol, ipratropium-albuterol, magic mouthwash **AND** lidocaine, magnesium hydroxide, menthol-cetylpyridinium, metoprolol tartrate, oxyCODONE-acetaminophen, phenol, simethicone  Assessment/ Plan:  Melanie Bowen is a 59 y.o. black female with HIV, COPD, hypertension, GERD, who is admitted to [facility] on 04/14/2020 for Dyspnea [R06.00] Colonic diverticular abscess [K57.20] Diverticulitis of large intestine with abscess without bleeding [K57.20] Abscess of sigmoid colon due to diverticulitis [K57.20]  #Acute renal failure   -  Patient seen and evaluated during dialysis treatment today.  Tolerating well.  We plan to complete dialysis treatment today.  Lab Results  Component Value Date   CREATININE 2.91 (H) 05/14/2020   CREATININE 2.69 (H) 05/13/2020   CREATININE 3.66 (H) 05/12/2020   Outpatient chair at [place] MWF schedule.   #. Anemia of chronic kidney disease:  Lab Results  Component Value Date   HGB 7.4 (L) 05/14/2020  Administer Epogen 10,000 units IV with dialysis treatment today.  #Fever Receiving Anidulafungin (started on 05/03/20) meropenem IV (started on 05/10/20) Afebrile now   # Hypotension Blood pressure currently 99/79.  Continue to monitor closely.  May need to consider adding midodrine.      LOS: 30 Talulah Schirmer 9/15/20219:37 AM

## 2020-05-14 NOTE — Treatment Plan (Signed)
Daughter called, Melanie Bowen and notified she will have to take her AMA papers to her PCP to get filled out. LMOVM

## 2020-05-14 NOTE — Treatment Plan (Signed)
Patient back from dialysis at this time. Pt upper lip bleeding at this time. Epogen given. Report not called at this time from dialysis. Pt had 2 bowel movements and cleansed at this time with CHG wipes and soap and water. Dressing changed to the sacrum with xerofoam and barrier cream. Dressing to left lower drain changed as well. Daughter Suzi Roots called at this time.

## 2020-05-14 NOTE — Progress Notes (Addendum)
Progress Note    Melanie Bowen  IRS:[id number] DOB: November 03, 1960  DOA: 04/14/2020 PCP: Theotis Burrow, MD      Brief Narrative:    Medical records reviewed and are as summarized below:  Melanie Bowen is a 59 y.o. female       Assessment/Plan:   Principal Problem:   Severe sepsis with septic shock ([place]) Active Problems:   HTN (hypertension)   HIV (human immunodeficiency virus infection) ([place])   Diverticulitis of large intestine with abscess without bleeding   Abscess of sigmoid colon due to diverticulitis   Acute renal failure (ARF) ([place])   Hypotension   Diverticulosis   Acute respiratory distress   DNR (do not resuscitate) discussion   Palliative care by specialist   Dyspnea   Goals of care, counseling/discussion   Pressure injury of skin   Nutrition Problem: Increased nutrient needs Etiology: chronic illness (COPD, new HD)  Signs/Symptoms: estimated needs   Body mass index is 44.24 kg/m.  (Morbid obesity)  Septic shock - resolved  Acute sigmoid diverticulitis and diverticular abscess, significant leukocytosis- s/p CT-guided drainage by IR on 04/14/2020.  Fluid culture showed Klebsiella pneumonia, pseudomonas aeruginosa and E. Coli.  Worsening leukocytosis  Acute kidney injury complicated by hyperkalemia and metabolic acidosis  Atrial fibrillation with RVR   Acute anemia, likely multifactorial poor oral intake and acute illness   Sinus tachycardia  COPD exacerbation-resolved  HIV infection  Acute liver failure  Acute hypoxemic respiratory failure   Acute metabolic encephalopathy/delirium -recurrent.  No acute abnormality on CT head  History of stroke    PLAN  Patient has been refusing treatment or care at times. Repeat CT abdomen pelvis on 05/12/2020 showed extraluminal enteric contrast adjacent to the left lateral segment of left hepatic lobe which has increased in size.  Plan for IR to evaluate drain and  possibly exchange and reposition the drain tomorrow. S/p transfusion with 1 unit of PRBCs on 05/01/2020, 05/04/2020 and 05/09/2020. Continue amiodarone for A. fib Continue bronchodilators for COPD Continue antiretroviral therapy Follow-up with nephrologist for hemodialysis Follow-up with ID specialist, general surgery and interventional radiologist.   Diet Order            Diet NPO time specified  Diet effective midnight           DIET DYS 3 Room service appropriate? Yes; Fluid consistency: Thin  Diet effective now                    Consultants:  General surgeon  Cardiologist  Nephrologist  Vascular surgeon  Procedures:  CT-guided drainage of diverticular abscess on 04/14/2020    Medications:   . acidophilus  1 capsule Oral Daily  . alteplase  2 mg Intracatheter Once  . alteplase  2 mg Intracatheter Once  . amiodarone  200 mg Oral BID  . Chlorhexidine Gluconate Cloth  6 each Topical Daily  . rilpivirine  25 mg Oral Q breakfast   And  . dolutegravir  50 mg Oral Q breakfast  . dronabinol  5 mg Oral BID AC  . epoetin (EPOGEN/PROCRIT) injection  10,000 Units Intravenous Q M,W,F-HD  . famotidine  10 mg Oral Q1200  . feeding supplement (ENSURE ENLIVE)  237 mL Oral TID BM  . guaiFENesin-dextromethorphan  10 mL Oral Q8H  . heparin injection (subcutaneous)  5,000 Units Subcutaneous Q8H  . magnesium oxide  400 mg Oral Daily  . mometasone-formoterol  2 puff Inhalation BID  . multivitamin  1 tablet Oral QHS  . multivitamin with minerals  1 tablet Oral Daily  . sodium chloride flush  5 mL Intracatheter Q8H  . torsemide  40 mg Oral Daily   Continuous Infusions: . sodium chloride 250 mL (05/03/20 0043)  . sodium chloride Stopped (05/12/20 0620)  . anidulafungin 100 mg (05/14/20 0348)  . meropenem (MERREM) IV 500 mg (05/14/20 0302)     Anti-infectives (From admission, onward)   Start     Dose/Rate Route Frequency Ordered Stop   05/10/20 2200  meropenem (MERREM) 500  mg in sodium chloride 0.9 % 100 mL IVPB  Status:  Discontinued        500 mg 200 mL/hr over 30 Minutes Intravenous Every 12 hours 05/10/20 1324 05/10/20 1328   05/10/20 2200  meropenem (MERREM) 500 mg in sodium chloride 0.9 % 100 mL IVPB        500 mg 200 mL/hr over 30 Minutes Intravenous Daily 05/10/20 1328     05/09/20 2200  meropenem (MERREM) 1 g in sodium chloride 0.9 % 100 mL IVPB  Status:  Discontinued        1 g 200 mL/hr over 30 Minutes Intravenous Every 12 hours 05/09/20 2121 05/10/20 1324   05/07/20 1300  rilpivirine (EDURANT) tablet 25 mg       "And" Linked Group Details   25 mg Oral Daily with breakfast 05/07/20 1245     05/07/20 1300  dolutegravir (TIVICAY) tablet 50 mg       "And" Linked Group Details   50 mg Oral Daily with breakfast 05/07/20 1245     05/06/20 0956  meropenem (MERREM) 500 mg in sodium chloride 0.9 % 100 mL IVPB  Status:  Discontinued        500 mg 200 mL/hr over 30 Minutes Intravenous Every 24 hours 05/05/20 1018 05/05/20 1637   05/03/20 2000  anidulafungin (ERAXIS) 100 mg in sodium chloride 0.9 % 100 mL IVPB        100 mg 78 mL/hr over 100 Minutes Intravenous Every 24 hours 05/02/20 2223     05/02/20 2330  anidulafungin (ERAXIS) 200 mg in sodium chloride 0.9 % 200 mL IVPB        200 mg 78 mL/hr over 200 Minutes Intravenous  Once 05/02/20 2223 05/03/20 0455   04/30/20 0800  rilpivirine (EDURANT) tablet 25 mg  Status:  Discontinued       "And" Linked Group Details   25 mg Oral Daily with breakfast 04/29/20 1254 05/02/20 2223   04/30/20 0800  dolutegravir (TIVICAY) tablet 50 mg  Status:  Discontinued       "And" Linked Group Details   50 mg Oral Daily with breakfast 04/29/20 1254 05/02/20 2223   04/28/20 2200  meropenem (MERREM) 500 mg in sodium chloride 0.9 % 100 mL IVPB  Status:  Discontinued        500 mg 200 mL/hr over 30 Minutes Intravenous Every 12 hours 04/28/20 1546 05/05/20 1018   04/26/20 2200  meropenem (MERREM) 500 mg in sodium chloride 0.9 %  100 mL IVPB  Status:  Discontinued        500 mg 200 mL/hr over 30 Minutes Intravenous Every 8 hours 04/26/20 1258 04/28/20 1546   04/25/20 1700  rilpivirine (EDURANT) tablet 25 mg  Status:  Discontinued        25 mg Oral Daily with breakfast 04/24/20 1636 04/25/20 1111   04/25/20 1200  rilpivirine (EDURANT) tablet 25 mg  Status:  Discontinued  25 mg Oral Daily with breakfast 04/25/20 1111 04/29/20 1254   04/25/20 1000  meropenem (MERREM) 500 mg in sodium chloride 0.9 % 100 mL IVPB  Status:  Discontinued        500 mg 200 mL/hr over 30 Minutes Intravenous Every 12 hours 04/25/20 0935 04/26/20 1258   04/24/20 1700  darunavir-cobicistat (PREZCOBIX) 800-150 MG per tablet 1 tablet  Status:  Discontinued        1 tablet Oral Daily with breakfast 04/24/20 1231 04/24/20 1636   04/24/20 1330  dolutegravir (TIVICAY) tablet 50 mg  Status:  Discontinued        50 mg Oral Daily 04/24/20 1231 04/29/20 1254   04/23/20 2200  meropenem (MERREM) 500 mg in sodium chloride 0.9 % 100 mL IVPB  Status:  Discontinued        500 mg 200 mL/hr over 30 Minutes Intravenous Every 24 hours 04/23/20 1127 04/25/20 0935   04/18/20 2000  levofloxacin (LEVAQUIN) IVPB 750 mg        750 mg 100 mL/hr over 90 Minutes Intravenous  Once 04/18/20 1857 04/19/20 0004   04/18/20 1800  levofloxacin (LEVAQUIN) IVPB 750 mg  Status:  Discontinued        750 mg 100 mL/hr over 90 Minutes Intravenous  Once 04/18/20 1640 04/18/20 1857   04/16/20 2200  anidulafungin (ERAXIS) 100 mg in sodium chloride 0.9 % 100 mL IVPB        100 mg 78 mL/hr over 100 Minutes Intravenous Every 24 hours 04/16/20 1707 04/22/20 0211   04/15/20 2200  valACYclovir (VALTREX) tablet 500 mg  Status:  Discontinued        500 mg Oral Daily at bedtime 04/15/20 1248 04/16/20 1259   04/15/20 2200  anidulafungin (ERAXIS) 200 mg in sodium chloride 0.9 % 200 mL IVPB        200 mg 78 mL/hr over 200 Minutes Intravenous  Once 04/15/20 2008 04/16/20 0327   04/15/20 2200   meropenem (MERREM) 1 g in sodium chloride 0.9 % 100 mL IVPB  Status:  Discontinued        1 g 200 mL/hr over 30 Minutes Intravenous Every 12 hours 04/15/20 2017 04/23/20 1127   04/15/20 1400  piperacillin-tazobactam (ZOSYN) IVPB 3.375 g  Status:  Discontinued        3.375 g 12.5 mL/hr over 240 Minutes Intravenous Every 8 hours 04/15/20 1300 04/15/20 2006   04/14/20 2200  elvitegravir-cobicistat-emtricitabine-tenofovir (GENVOYA) 150-150-200-10 MG tablet 1 tablet  Status:  Discontinued        1 tablet Oral Daily at bedtime 04/14/20 0235 04/15/20 0957   04/14/20 2200  valACYclovir (VALTREX) tablet 1,000 mg  Status:  Discontinued        1,000 mg Oral Daily at bedtime 04/14/20 0235 04/15/20 1248   04/14/20 1230  cefTRIAXone (ROCEPHIN) 2 g in sodium chloride 0.9 % 100 mL IVPB  Status:  Discontinued        2 g 200 mL/hr over 30 Minutes Intravenous Every 24 hours 04/14/20 1223 04/15/20 1300   04/14/20 1230  metroNIDAZOLE (FLAGYL) IVPB 500 mg  Status:  Discontinued        500 mg 100 mL/hr over 60 Minutes Intravenous Every 8 hours 04/14/20 1223 04/15/20 1636   04/14/20 1030  piperacillin-tazobactam (ZOSYN) IVPB 3.375 g  Status:  Discontinued        3.375 g 12.5 mL/hr over 240 Minutes Intravenous Every 8 hours 04/14/20 0235 04/14/20 1223   04/14/20 0215  piperacillin-tazobactam (ZOSYN) IVPB  4.5 g  Status:  Discontinued        4.5 g 200 mL/hr over 30 Minutes Intravenous  Once 04/14/20 0200 04/14/20 0208   04/14/20 0215  piperacillin-tazobactam (ZOSYN) IVPB 3.375 g        3.375 g 100 mL/hr over 30 Minutes Intravenous  Once 04/14/20 0208 04/14/20 0301             Family Communication/Anticipated D/C date and plan/Code Status   DVT prophylaxis: heparin injection 5,000 Units Start: 04/23/20 0800     Code Status: Partial Code  Family Communication:  Disposition Plan:    Status is: Inpatient  Remains inpatient appropriate because:IV treatments appropriate due to intensity of illness or  inability to take PO and Inpatient level of care appropriate due to severity of illness   Dispo: The patient is from: Home              Anticipated d/c is to: SNF              Anticipated d/c date is: > 3 days              Patient currently is not medically stable to d/c.           Subjective:   She is confused unable to provide an adequate history.  She is drowsy.  She was seen dialysis unit.  Objective:    Vitals:   05/13/20 2259 05/14/20 0633 05/14/20 1100 05/14/20 1349  BP: (!) 121/53 99/79    Pulse: 95 98    Resp: (!) 24 20    Temp: 98.4 F (36.9 C) 99.1 F (37.3 C)    TempSrc:  Oral    SpO2: 100% 95% 96%   Weight:    124.3 kg  Height:       No data found.   Intake/Output Summary (Last 24 hours) at 05/14/2020 1642 Last data filed at 05/14/2020 1523 Gross per 24 hour  Intake 440 ml  Output 20 ml  Net 420 ml   Filed Weights   05/03/20 0547 05/09/20 0951 05/14/20 1349  Weight: 130.9 kg 119 kg 124.3 kg    Exam:  GEN: NAD SKIN: Warm and dry. EYES: EOMI ENT: MMM CV: RRR PULM: CTA B ABD: soft, ND, NT, +BS, + JP drain with brownish fluid.  There is brownish fluid leaking around JP drain on left lower quadrant. CNS: AAO x 3, non focal EXT: Bilateral leg edema, no tenderness   Data Reviewed:   I have personally reviewed following labs and imaging studies:  Labs: Labs show the following:   Basic Metabolic Panel: Recent Labs  Lab 05/08/20 1137 05/08/20 1137 05/09/20 0930 05/09/20 0930 05/10/20 0605 05/10/20 0605 05/11/20 0708 05/11/20 0708 05/12/20 0744 05/12/20 0744 05/13/20 0728 05/14/20 0604  NA 139   < > 143   < > 141  --  141  --  140  --  138 138  K 3.7   < > 3.6   < > 3.7   < > 3.7   < > 3.8   < > 3.5 3.8  CL 98   < > 101   < > 102  --  102  --  101  --  101 101  CO2 27   < > 30   < > 27  --  28  --  27  --  26 25  GLUCOSE 80   < > 85   < > 77  --  70  --  72  --  86 82  BUN 54*   < > 60*   < > 41*  --  45*  --  52*  --  33*  39*  CREATININE 3.87*   < > 3.74*   < > 2.96*  --  3.45*  --  3.66*  --  2.69* 2.91*  CALCIUM 7.3*   < > 7.3*   < > 7.6*  --  7.5*  --  7.5*  --  7.3* 7.4*  MG 1.8  --  2.1  --  2.1  --  2.0  --  2.0  --   --   --    < > = values in this interval not displayed.   GFR Estimated Creatinine Clearance: 28.4 mL/min (A) (by C-G formula based on SCr of 2.91 mg/dL (H)). Liver Function Tests: Recent Labs  Lab 05/14/20 0604  AST 25  ALT 23  ALKPHOS 78  BILITOT 1.3*  PROT 5.3*  ALBUMIN 1.1*   No results for input(s): LIPASE, AMYLASE in the last 168 hours. No results for input(s): AMMONIA in the last 168 hours. Coagulation profile No results for input(s): INR, PROTIME in the last 168 hours.  CBC: Recent Labs  Lab 05/10/20 0605 05/11/20 0708 05/12/20 0744 05/13/20 0728 05/14/20 0604  WBC 2.7* 2.9* 5.4 14.3* 19.9*  HGB 8.5* 7.5* 8.0* 7.3* 7.4*  HCT 26.1* 23.2* 25.1* 22.6* 22.4*  MCV 91.3 90.3 91.3 90.8 91.4  PLT 383 405* 450* 390 381   Cardiac Enzymes: No results for input(s): CKTOTAL, CKMB, CKMBINDEX, TROPONINI in the last 168 hours. BNP (last 3 results) No results for input(s): PROBNP in the last 8760 hours. CBG: No results for input(s): GLUCAP in the last 168 hours. D-Dimer: No results for input(s): DDIMER in the last 72 hours. Hgb A1c: No results for input(s): HGBA1C in the last 72 hours. Lipid Profile: No results for input(s): CHOL, HDL, LDLCALC, TRIG, CHOLHDL, LDLDIRECT in the last 72 hours. Thyroid function studies: No results for input(s): TSH, T4TOTAL, T3FREE, THYROIDAB in the last 72 hours.  Invalid input(s): FREET3 Anemia work up: No results for input(s): VITAMINB12, FOLATE, FERRITIN, TIBC, IRON, RETICCTPCT in the last 72 hours. Sepsis Labs: Recent Labs  Lab 05/11/20 0708 05/12/20 0744 05/13/20 0728 05/14/20 0604  WBC 2.9* 5.4 14.3* 19.9*    Microbiology No results found for this or any previous visit (from the past 240 hour(s)).  Procedures and  diagnostic studies:  CT ABDOMEN PELVIS WO CONTRAST  Result Date: 05/12/2020 CLINICAL DATA:  Intra-abdominal abscess, fever EXAM: CT ABDOMEN AND PELVIS WITHOUT CONTRAST TECHNIQUE: Multidetector CT imaging of the abdomen and pelvis was performed following the standard protocol without IV contrast. COMPARISON:  None. FINDINGS: Lower chest: The visualized lung bases are clear bilaterally. Cardiac blood pool is of relatively low attenuation in keeping with at least moderate anemia. Global cardiac size within normal limits. Hepatobiliary: Hyperdensity of the gallbladder contents likely represents vicarious excretion of contrast. The gallbladder and liver are otherwise unremarkable. No intra or extrahepatic biliary ductal dilation. Pancreas: Unremarkable Spleen: Unremarkable Adrenals/Urinary Tract: Unremarkable Stomach/Bowel: There is extraluminal enteric contrast identified adjacent to the lateral segment of the left hepatic lobe which has increased in volume since prior examination. The exact source of leakage, however, is not clearly identified on this examination. The small bowel is diffusely mildly dilated containing both gas and enteric contrast in keeping with changes of a mild ileus. Oral contrast, however, does appear to extend into the terminal ileum. Adjacent  to the terminal ileum is a loculated collection of extraluminal gas measuring 6.7 x 2.5 cm. The pigtail drainage catheter is just within this collection, unchanged from prior examination. The volume of gas is increased since prior examination, possibly the result of a flushing of the drainage catheter or a a continued enteric communication. There is extensive descending and sigmoid colonic diverticulosis without superimposed inflammatory change. Large bowel otherwise unremarkable. Appendix normal. There is mild ascites present, similar in volume to prior examination. Diffuse body wall edema has improved in the interval since prior examination.  Vascular/Lymphatic: The abdominal vasculature is age-appropriate with moderate aortoiliac atherosclerotic calcification. No aneurysm. No pathologic adenopathy within the abdomen and pelvis. Reproductive: Uterus and bilateral adnexa are unremarkable. Other: Rectum unremarkable. Musculoskeletal: Lumbar fusion with instrumentation has been performed. No lytic or blastic bone lesions. IMPRESSION: 1. Extraluminal enteric contrast adjacent to the lateral segment of the left hepatic lobe which has increased in volume since prior examination. The exact source of leakage, however, is not clearly identified on this examination. 2. Loculated collection of extraluminal gas adjacent to the terminal ileum. The pigtail drainage catheter is just within this collection, unchanged from prior examination. The volume of gas is increased since prior examination, possibly the result of a flushing of the drainage catheter or a continued enteric communication. 3. Mild ileus. 4. Improved body wall edema. Aortic Atherosclerosis (ICD10-I70.0). Electronically Signed   By: Fidela Salisbury MD   On: 05/12/2020 21:00               LOS: 30 days   Naome Brigandi  [facility]   Pager on [email or web address]. If 7PM-7AM, please contact night-coverage at [email or web address]     05/14/2020, 4:42 PM

## 2020-05-14 NOTE — Progress Notes (Signed)
   05/14/20 1300  Clinical Encounter Type  Visited With Patient;Health care provider  Visit Type Follow-up  Referral From Chaplain  Consult/Referral To Chaplain  Chaplain stopped back by to see patient and nurse came in and chaplain told her patient was slipping down. Chaplain did get pt to talk to her more than she had in the past. Chaplain commented on the flowers in the window and patient said her mother sent them. Chaplain also commented on a picture in the window and pt told chaplain that it was her.  Chaplain's visit was brief but good. Chaplain will follow up with patient again trying to engage her in conversation.

## 2020-05-14 NOTE — Progress Notes (Signed)
ID Pt is still lethargic encephalopathic Speech not comprehensible Has started taking PO meds   Patient Vitals for the past 24 hrs:  BP Temp Temp src Pulse Resp SpO2  05/14/20 1100 -- -- -- -- -- 96 %  05/14/20 0633 99/79 99.1 F (37.3 C) Oral 98 20 95 %  05/13/20 2259 (!) 121/53 98.4 F (36.9 C) -- 95 (!) 24 100 %  chest b/l air entry- crepts bases Hss1s2 abd soft distended- no tenderness- left drain Greenish fluid  labs CBC Latest Ref Rng & Units 05/14/2020 05/13/2020 05/12/2020  WBC 4.0 - 10.5 K/uL 19.9(H) 14.3(H) 5.4  Hemoglobin 12.0 - 15.0 g/dL 7.4(L) 7.3(L) 8.0(L)  Hematocrit 36 - 46 % 22.4(L) 22.6(L) 25.1(L)  Platelets 150 - 400 K/uL 381 390 450(H)    CMP Latest Ref Rng & Units 05/14/2020 05/13/2020 05/12/2020  Glucose 70 - 99 mg/dL 82 86 72  BUN 6 - 20 mg/dL 39(H) 33(H) 52(H)  Creatinine 0.44 - 1.00 mg/dL 2.91(H) 2.69(H) 3.66(H)  Sodium 135 - 145 mmol/L 138 138 140  Potassium 3.5 - 5.1 mmol/L 3.8 3.5 3.8  Chloride 98 - 111 mmol/L 101 101 101  CO2 22 - 32 mmol/L 25 26 27   Calcium 8.9 - 10.3 mg/dL 7.4(L) 7.3(L) 7.5(L)  Total Protein 6.5 - 8.1 g/dL 5.3(L) - -  Total Bilirubin 0.3 - 1.2 mg/dL 1.3(H) - -  Alkaline Phos 38 - 126 U/L 78 - -  AST 15 - 41 U/L 25 - -  ALT 0 - 44 U/L 23 - -   Imaging      Loculated collection of extraluminal gas adjacent to the terminal ileum. The pigtail drainage catheter is just within this collection, unchanged from prior examination. The volume of gas is increased since prior examination, possibly the result of a flushing of the drainage catheter or a continued enteric communication.  Impression/recommendation Septic shocksecondary to perforated diverticulitisand peridiverticular abscess. pseudomonas, kleb andESBLe.coli in the culture. JP drain inserted on 04/14/20.  meropenem 04/15/20>>05/06/20 and restarted 05/09/20 for fever anidulafungin 8/17>>8/24and restarted on 9/3/21for fever and leucocytosis- The latter is  worsening- CT abdomen done on 9/13 shows pockets of collection and Loculated collection of extraluminal gas adjacent to the terminal Ileum. This is poor source control . Antibiotics do not work effectively and will only lead to MDRO and cdiff Will discuss with surgery as drains alone may not be adequate because of diverticular perforation and no likely feculent discharge  Encephalopathy-metabolic -fluctuating-  Hypoxic resp failureon presentation-needed intubation for a few days s/p extubation Rigorousincentive spirometry Chest PT because of secretions and atelectasis   AKI due to septic shock/contrast nephropathy-getting dialysis  Anasarca-much better  HIV- well controlled in jan 2021 her Vl <20 and cd4 >1000.  On 05/03/20 Vl is < 20 [id number] ( 50%)On dolutegravir and rilpivirine - started taking oral meds  Afib on amiodarone Discussed the management with the radiologist

## 2020-05-14 NOTE — Treatment Plan (Signed)
Pts daughter at bedside and was told she could sign consent for drain placement tomorrow, [id number]. Patients daughter signed consent and it is in chart, Probation officer witnessed. OK to get consent per Richrd Sox, PA. Daughter will be at work and unavailable tomorrow.

## 2020-05-14 NOTE — Progress Notes (Signed)
   05/14/20 1200  Clinical Encounter Type  Visited With Patient not available  Visit Type Follow-up  Referral From Chaplain  Consult/Referral To Chaplain  While rounding, chaplain stopped to visit with pt and she was not in the room. Chaplain will follow up with her later.

## 2020-05-14 NOTE — Progress Notes (Signed)
[facility] SURGICAL PROGRESS NOTE (cpt [phone])  Hospital Day(s): 30.   Post op day(s): 19 Days Post-Op.   Interval History:  Patient seen and examined no acute events or new complaints overnight.  Patient reports she is feeling fine this morning, only discomfort at drain site She denies fever, chills, nausea, or emesis. Leukocytosis worsening in last 48 hours, now up to 19.9k; no fevers Renal function still elevated burt improved, sCr - 2.91, UO unmeasured Remains on HD No electrolyte derangements  Suprapubic drain with 60 cs out in last 24 hours, now appears more like stool than previous exams  Review of Systems:  Constitutional: denies fever, chills  HEENT: denies cough or congestion  Respiratory: denies any shortness of breath  Cardiovascular: denies chest pain or palpitations  Gastrointestinal: denies abdominal pain, N/V, or diarrhea/and bowel function as per interval history Genitourinary: denies burning with urination or urinary frequency Musculoskeletal: denies pain, decreased motor or sensation   Vital signs in last 24 hours: [min-max] current  Temp:  [97.5 F (36.4 C)-99.1 F (37.3 C)] 99.1 F (37.3 C) (09/15 0633) Pulse Rate:  [95-105] 98 (09/15 0633) Resp:  [20-24] 20 (09/15 9562) BP: (99-121)/(53-79) 99/79 (09/15 1308) SpO2:  [93 %-100 %] 95 % (09/15 0633)     Height: 5\' 6"  (167.6 cm) Weight: 119 kg BMI (Calculated): 42.36   Intake/Output last 2 shifts:  09/14 0701 - 09/15 0700 In: 613.4 [P.O.:510; I.V.:3.4; IV Piggyback:100] Out: 60 [Drains:60]   Physical Exam:  Constitutional: alert, cooperative and no distress  HENT: normocephalic without obvious abnormality  Eyes: PERRL, EOM's grossly intact and symmetric  Respiratory: breathing non-labored at rest  Cardiovascular: regular rate and sinus rhythm  Gastrointestinal: soft, tenderness over suprapubic drain site, and non-distended, no rebound/guarding, JP in LLQ output now appears feculent   Musculoskeletal: no edema or wounds, motor and sensation grossly intact, NT    Labs:  CBC Latest Ref Rng & Units 05/14/2020 05/13/2020 05/12/2020  WBC 4.0 - 10.5 K/uL 19.9(H) 14.3(H) 5.4  Hemoglobin 12.0 - 15.0 g/dL 7.4(L) 7.3(L) 8.0(L)  Hematocrit 36 - 46 % 22.4(L) 22.6(L) 25.1(L)  Platelets 150 - 400 K/uL 381 390 450(H)   CMP Latest Ref Rng & Units 05/14/2020 05/13/2020 05/12/2020  Glucose 70 - 99 mg/dL 82 86 72  BUN 6 - 20 mg/dL 39(H) 33(H) 52(H)  Creatinine 0.44 - 1.00 mg/dL 2.91(H) 2.69(H) 3.66(H)  Sodium 135 - 145 mmol/L 138 138 140  Potassium 3.5 - 5.1 mmol/L 3.8 3.5 3.8  Chloride 98 - 111 mmol/L 101 101 101  CO2 22 - 32 mmol/L 25 26 27   Calcium 8.9 - 10.3 mg/dL 7.4(L) 7.3(L) 7.5(L)  Total Protein 6.5 - 8.1 g/dL - - -  Total Bilirubin 0.3 - 1.2 mg/dL - - -  Alkaline Phos 38 - 126 U/L - - -  AST 15 - 41 U/L - - -  ALT 0 - 44 U/L - - -     Imaging studies:   CT Abdomen/Pelvis (05/12/2020) personally reviewed contrast near liver, air fluid level in air of previous drain, ascites present, radiologist report reviewed and discussed with IR MD (Dr Pascal Lux): IMPRESSION: 1. Extraluminal enteric contrast adjacent to the lateral segment of the left hepatic lobe which has increased in volume since prior examination. The exact source of leakage, however, is not clearly identified on this examination. 2. Loculated collection of extraluminal gas adjacent to the terminal ileum. The pigtail drainage catheter is just within this collection, unchanged from prior examination. The  volume of gas is increased since prior examination, possibly the result of a flushing of the drainage catheter or a continued enteric communication. 3. Mild ileus. 4. Improved body wall edema.   Assessment/Plan: (ICD-10's: K49.20) 59 y.o. female with sigmoid diverticulitis with abscess s/p percutaneous drainage on 08/16andshe continues to bewithout any evidence of peritonitis/pneumoperitoneum/pneumatosis on  examination or imaging however her drain output has now become feculent,complicated by pertinent comorbidities includingmarked COPD   - I reviewed the patient's case and recent CT imaging studies with Dr Pascal Lux (IR), he felt that the contrast in the LUQ was actually intra-hepatic IV contrast which had become loculated in the liver and did not represent extravasation of oral contrast, agree there was some increase in ascites, drain in position but was pulled back some. We will tentatively plan for up-sizing of the drain tomorrow in hopes to better control more feculent output.     - Hold heparin starting at midnight  - Okay to resume diet today; NPO at midnight  - Continue IV Abx(meropenem); ID followingand appreciate assistance; Cx withpseudomonas, kleb andESBLe.coli - Monitor abdominal examination; leukocytosis - No emergent surgical intervention - Pain control prn; antiemetics prn   - Appreciate nephrology assistance and recommendations - Continue to work with therapies; suspect she will need significant rehab - Further management per primary service; we will continue to follow    All of the above findings and recommendations were discussed with the patient, and the medical team, and all of patient's questions were answered to her expressed satisfaction.  -- Edison Simon, PA-C [facility] 05/14/2020, 10:25 AM [phone] M-F: 7am - 4pm

## 2020-05-15 ENCOUNTER — Inpatient Hospital Stay: Payer: Medicare Other

## 2020-05-15 LAB — CBC
HCT: 22.6 % — ABNORMAL LOW (ref 36.0–46.0)
Hemoglobin: 7 g/dL — ABNORMAL LOW (ref 12.0–15.0)
MCH: 28.9 pg (ref 26.0–34.0)
MCHC: 31 g/dL (ref 30.0–36.0)
MCV: 93.4 fL (ref 80.0–100.0)
Platelets: 338 10*3/uL (ref 150–400)
RBC: 2.42 MIL/uL — ABNORMAL LOW (ref 3.87–5.11)
RDW: 18.3 % — ABNORMAL HIGH (ref 11.5–15.5)
WBC: 28.8 10*3/uL — ABNORMAL HIGH (ref 4.0–10.5)
nRBC: 0.7 % — ABNORMAL HIGH (ref 0.0–0.2)

## 2020-05-15 LAB — BASIC METABOLIC PANEL
Anion gap: 10 (ref 5–15)
BUN: 26 mg/dL — ABNORMAL HIGH (ref 6–20)
CO2: 27 mmol/L (ref 22–32)
Calcium: 7.4 mg/dL — ABNORMAL LOW (ref 8.9–10.3)
Chloride: 100 mmol/L (ref 98–111)
Creatinine, Ser: 2.32 mg/dL — ABNORMAL HIGH (ref 0.44–1.00)
GFR calc Af Amer: 26 mL/min — ABNORMAL LOW (ref >60)
GFR calc non Af Amer: 22 mL/min — ABNORMAL LOW (ref >60)
Glucose, Bld: 96 mg/dL (ref 70–99)
Potassium: 3 mmol/L — ABNORMAL LOW (ref 3.5–5.1)
Sodium: 137 mmol/L (ref 135–145)

## 2020-05-15 LAB — APTT: aPTT: 35 seconds (ref 24–36)

## 2020-05-15 LAB — PREPARE RBC (CROSSMATCH)

## 2020-05-15 LAB — PROTIME-INR
INR: 1.2 (ref 0.8–1.2)
Prothrombin Time: 14.7 seconds (ref 11.4–15.2)

## 2020-05-15 MED ORDER — FENTANYL CITRATE (PF) 100 MCG/2ML IJ SOLN
INTRAMUSCULAR | Status: AC
  Filled 2020-05-15: qty 2

## 2020-05-15 MED ORDER — FENTANYL CITRATE (PF) 100 MCG/2ML IJ SOLN
INTRAMUSCULAR | Status: DC | PRN
  Administered 2020-05-15 (×2): 25 ug via INTRAVENOUS

## 2020-05-15 MED ORDER — MIDAZOLAM HCL 2 MG/2ML IJ SOLN
INTRAMUSCULAR | Status: AC
  Filled 2020-05-15: qty 2

## 2020-05-15 MED ORDER — POTASSIUM CHLORIDE CRYS ER 20 MEQ PO TBCR: 40.0000 meq | EXTENDED_RELEASE_TABLET | Freq: Once | ORAL | Status: DC

## 2020-05-15 MED ORDER — SODIUM CHLORIDE 0.9% IV SOLUTION: Freq: Once | INTRAVENOUS | Status: DC

## 2020-05-15 MED ORDER — SODIUM CHLORIDE 0.9% FLUSH
5.0000 mL | Freq: Three times a day (TID) | INTRAVENOUS | Status: DC
  Administered 2020-05-15 – 2020-05-23 (×23): 5 mL

## 2020-05-15 NOTE — Progress Notes (Signed)
Daily Progress Note   Patient Name: Melanie Bowen       Date: 05/15/2020 DOB: Aug 05, 1961  Age: 59 y.o. MRN#: [id number] Attending Physician: Jennye Boroughs, MD Primary Care Physician: Theotis Burrow, MD Admit Date: 04/14/2020  Reason for Consultation/Follow-up: Establishing goals of care  Subjective: Patient is resting in bed. Opens eyes but does not speak. Spoke with daughter. She states she feels bad she has to work and cannot be here more often, but is trying to get time off to be here. She states she hopes upsizing the drain will help her mother, and hopes the fistula output will decrease and close without surgery. She understands her mother's risk for surgery and need for ventilator support after anesthesia. She states "I am not giving up." Continue current care.   Length of Stay: 31  Current Medications: Scheduled Meds:  . acidophilus  1 capsule Oral Daily  . alteplase  2 mg Intracatheter Once  . alteplase  2 mg Intracatheter Once  . amiodarone  200 mg Oral BID  . Chlorhexidine Gluconate Cloth  6 each Topical Daily  . rilpivirine  25 mg Oral Q breakfast   And  . dolutegravir  50 mg Oral Q breakfast  . dronabinol  5 mg Oral BID AC  . epoetin (EPOGEN/PROCRIT) injection  10,000 Units Intravenous Q M,W,F-HD  . famotidine  10 mg Oral Q1200  . feeding supplement (ENSURE ENLIVE)  237 mL Oral TID BM  . guaiFENesin-dextromethorphan  10 mL Oral Q8H  . magnesium oxide  400 mg Oral Daily  . mometasone-formoterol  2 puff Inhalation BID  . multivitamin  1 tablet Oral QHS  . multivitamin with minerals  1 tablet Oral Daily  . potassium chloride  40 mEq Oral Once  . sodium chloride flush  5 mL Intracatheter Q8H  . torsemide  40 mg Oral Daily    Continuous Infusions: . sodium  chloride 250 mL (05/03/20 0043)  . sodium chloride Stopped (05/12/20 0620)  . anidulafungin Stopped (05/15/20 0351)  . meropenem (MERREM) IV 200 mL/hr at 05/15/20 0425    PRN Meds: sodium chloride, acetaminophen **OR** acetaminophen, albuterol, ALPRAZolam, bisacodyl, diphenhydrAMINE, docusate sodium, heparin NICU/SCN flush, HYDROmorphone (DILAUDID) injection, iohexol, ipratropium-albuterol, magic mouthwash **AND** lidocaine, magnesium hydroxide, menthol-cetylpyridinium, metoprolol tartrate, oxyCODONE-acetaminophen, phenol, simethicone  Physical Exam Constitutional:  Comments: Opens eyes briefly.              Vital Signs: BP (!) 123/55   Pulse 89   Temp (!) 97.5 F (36.4 C) (Oral)   Resp 16   Ht 5\' 6"  (1.676 m)   Wt 124.3 kg   SpO2 95%   BMI 44.24 kg/m  SpO2: SpO2: 95 % O2 Device: O2 Device: Room Air O2 Flow Rate: O2 Flow Rate (L/min): 3 L/min  Intake/output summary:   Intake/Output Summary (Last 24 hours) at 05/15/2020 1228 Last data filed at 05/15/2020 0530 Gross per 24 hour  Intake 439.08 ml  Output 38 ml  Net 401.08 ml   LBM: Last BM Date: 05/15/20 Baseline Weight: Weight: 117.9 kg Most recent weight: Weight: 124.3 kg       Palliative Assessment/Data:    Flowsheet Rows     Most Recent Value  Intake Tab  Referral Department Hospitalist  Unit at Time of Referral ICU  Palliative Care Primary Diagnosis Other (Comment)  [sigmoid colon ]  Date Notified 04/14/20  Palliative Care Type New Palliative care  Reason for referral Clarify Goals of Care  Date of Admission 04/14/20  Date first seen by Palliative Care 04/15/20  # of days Palliative referral response time 1 Day(s)  # of days IP prior to Palliative referral 0  Clinical Assessment  Psychosocial & Spiritual Assessment  Palliative Care Outcomes      Patient Active Problem List   Diagnosis Date Noted  . Pressure injury of skin 05/08/2020  . Goals of care, counseling/discussion   . Dyspnea   .  Acute renal failure (ARF) ([place]) 04/16/2020  . Severe sepsis with septic shock ([place]) 04/16/2020  . Hypotension 04/16/2020  . Diverticulosis 04/16/2020  . Acute respiratory distress   . DNR (do not resuscitate) discussion   . Palliative care by specialist   . Abscess of sigmoid colon due to diverticulitis 04/14/2020  . Multifocal pneumonia 11/06/2019  . AKI (acute kidney injury) ([place]) 11/06/2019  . Leukocytosis 11/06/2019  . Diverticulitis of large intestine with abscess without bleeding 01/17/2019  . Morbid (severe) obesity due to excess calories ([place]) 11/26/2018  . Violation of controlled substance agreement 12/05/2017  . Positive urine drug screen (+cocaine) 12/05/2017  . Cervicalgia 12/05/2017  . Drug-seeking behavior 12/05/2017  . Community acquired pneumonia 11/28/2016  . Pneumonia 11/28/2016  . Allergic rhinitis 05/27/2016  . Tobacco abuse counseling 02/11/2016  . Symptomatic anemia   . Lower GI bleed   . Anemia 01/16/2016  . GI bleed 01/10/2016  . GERD (gastroesophageal reflux disease) 01/10/2016  . HTN (hypertension) 01/10/2016  . Depression 01/10/2016  . HIV (human immunodeficiency virus infection) ([place]) 01/10/2016  . Lumbar pseudoarthrosis 10/31/2015  . Chronic cough   . Cough 06/19/2015  . DDD (degenerative disc disease), lumbar 12/31/2014  . Degenerative disc disease, lumbar 12/31/2014  . Asthma, chronic 11/12/2014    Palliative Care Assessment & Plan    Recommendations/Plan: Continue current care. Daughter states her mother would not want to suffer living on a ventilator long term. She would be okay with a ventilator for surgery. She understands her mother may not be able to wean from a ventilator and she would then make decisions on withdrawing vent support if that were to happen.    Code Status:    Code Status Orders  (From admission, onward)         Start     Ordered   04/14/20 1859  Limited resuscitation (code)  Continuous       Question Answer  Comment  In the event of cardiac or respiratory ARREST: Initiate Code Blue, Call Rapid Response Yes   In the event of cardiac or respiratory ARREST: Perform CPR Yes   In the event of cardiac or respiratory ARREST: Perform Intubation/Mechanical Ventilation No   In the event of cardiac or respiratory ARREST: Use NIPPV/BiPAp only if indicated No   In the event of cardiac or respiratory ARREST: Administer ACLS medications if indicated Yes   In the event of cardiac or respiratory ARREST: Perform Defibrillation or Cardioversion if indicated Yes      04/14/20 1858        Code Status History    Date Active Date Inactive Code Status Order ID Comments User Context   04/14/2020 0235 04/14/2020 1858 Full Code [id number]  Mansy, Arvella Merles, MD ED   11/06/2019 0501 11/08/2019 1533 Full Code [id number]  Athena Masse, MD ED   01/17/2019 1756 01/18/2019 2120 Full Code [id number]  Benjamine Sprague, DO Inpatient   11/28/2016 1726 12/01/2016 1332 Full Code [id number]  Vaughan Basta, MD Inpatient   01/16/2016 1643 01/18/2016 1358 Full Code [id number]  Hillary Bow, MD ED   01/10/2016 0251 01/14/2016 1254 Full Code [id number]  Lance Coon, MD Inpatient   12/31/2014 1345 01/02/2015 1259 Full Code [id number]  Earnie Larsson, MD Inpatient   Advance Care Planning Activity      Prognosis: Poor   Thank you for allowing the Palliative Medicine Team to assist in the care of this patient.   Total Time 35 min Prolonged Time Billed  no      Greater than 50%  of this time was spent counseling and coordinating care related to the above assessment and plan.  Asencion Gowda, NP  Please contact Palliative Medicine Team phone at [phone] for questions and concerns.

## 2020-05-15 NOTE — Progress Notes (Signed)
[facility]  ROUNDING NOTE   Subjective:   Patient a bit more lethargic today but arousable. Had dialysis treatment yesterday.  Objective:  Vital signs in last 24 hours:  Temp:  [98.8 F (37.1 C)] 98.8 F (37.1 C) (09/16 0513) Pulse Rate:  [88-90] 90 (09/16 0513) Resp:  [18-22] 18 (09/16 0513) BP: (105-112)/(50-67) 112/67 (09/16 0513) SpO2:  [92 %-94 %] 94 % (09/16 0513) Weight:  [124.3 kg] 124.3 kg (09/15 1349)  Weight change:  Filed Weights   05/03/20 0547 05/09/20 0951 05/14/20 1349  Weight: 130.9 kg 119 kg 124.3 kg    Intake/Output: I/O last 3 completed shifts: In: 859.1 [P.O.:420; I.V.:5; Other:20; IV Piggyback:414.1] Out: 38 [Drains:38]   Intake/Output this shift:  No intake/output data recorded.  Physical Exam: General: Lethargic but arousable  Lungs:  Basilar rales, normal effort  Heart: Regular rate and rhythm  Abdomen:  Non tender, non distended  Extremities: No peripheral edema   Neurologic: Lethargic but arousable  Skin No rash  Access: RIJ permcath 8/27 Dr. [name]    Basic Metabolic Panel: Recent Labs  Lab 05/08/20 1137 05/08/20 1137 05/09/20 0930 05/09/20 0930 05/10/20 0605 05/10/20 2458 05/11/20 0708 05/11/20 0708 05/12/20 0998 05/12/20 0744 05/13/20 0728 05/14/20 0604 05/15/20 0443  NA 139   < > 143   < > 141   < > 141  --  140  --  138 138 137  K 3.7   < > 3.6   < > 3.7   < > 3.7  --  3.8  --  3.5 3.8 3.0*  CL 98   < > 101   < > 102   < > 102  --  101  --  101 101 100  CO2 27   < > 30   < > 27   < > 28  --  27  --  26 25 27   GLUCOSE 80   < > 85   < > 77   < > 70  --  72  --  86 82 96  BUN 54*   < > 60*   < > 41*   < > 45*  --  52*  --  33* 39* 26*  CREATININE 3.87*   < > 3.74*   < > 2.96*   < > 3.45*  --  3.66*  --  2.69* 2.91* 2.32*  CALCIUM 7.3*   < > 7.3*   < > 7.6*   < > 7.5*   < > 7.5*   < > 7.3* 7.4* 7.4*  MG 1.8  --  2.1  --  2.1  --  2.0  --  2.0  --   --   --   --    < > = values in this interval not displayed.     Liver Function Tests: Recent Labs  Lab 05/14/20 0604  AST 25  ALT 23  ALKPHOS 78  BILITOT 1.3*  PROT 5.3*  ALBUMIN 1.1*   No results for input(s): LIPASE, AMYLASE in the last 168 hours. No results for input(s): AMMONIA in the last 168 hours.  CBC: Recent Labs  Lab 05/11/20 0708 05/12/20 0744 05/13/20 0728 05/14/20 0604 05/15/20 0444  WBC 2.9* 5.4 14.3* 19.9* 28.8*  HGB 7.5* 8.0* 7.3* 7.4* 7.0*  HCT 23.2* 25.1* 22.6* 22.4* 22.6*  MCV 90.3 91.3 90.8 91.4 93.4  PLT 405* 450* 390 381 338    Cardiac Enzymes: No results for input(s): CKTOTAL, CKMB,  CKMBINDEX, TROPONINI in the last 168 hours.  BNP: Invalid input(s): POCBNP  CBG: No results for input(s): GLUCAP in the last 168 hours.  Microbiology: Results for orders placed or performed during the hospital encounter of 04/14/20  SARS Coronavirus 2 by RT PCR (hospital order, performed in [facility] hospital lab) Nasopharyngeal Nasopharyngeal Swab     Status: None   Collection Time: 04/14/20  2:17 AM   Specimen: Nasopharyngeal Swab  Result Value Ref Range Status   SARS Coronavirus 2 NEGATIVE NEGATIVE Final    Comment: (NOTE) SARS-CoV-2 target nucleic acids are NOT DETECTED.  The SARS-CoV-2 RNA is generally detectable in upper and lower respiratory specimens during the acute phase of infection. The lowest concentration of SARS-CoV-2 viral copies this assay can detect is 250 copies / mL. A negative result does not preclude SARS-CoV-2 infection and should not be used as the sole basis for treatment or other patient management decisions.  A negative result may occur with improper specimen collection / handling, submission of specimen other than nasopharyngeal swab, presence of viral mutation(s) within the areas targeted by this assay, and inadequate number of viral copies (<250 copies / mL). A negative result must be combined with clinical observations, patient history, and epidemiological information.  Fact Sheet  for Patients:   StrictlyIdeas.no  Fact Sheet for Healthcare Providers: BankingDealers.co.za  This test is not yet approved or  cleared by the [place] FDA and has been authorized for detection and/or diagnosis of SARS-CoV-2 by FDA under an Emergency Use Authorization (EUA).  This EUA will remain in effect (meaning this test can be used) for the duration of the COVID-19 declaration under Section 564(b)(1) of the Act, 21 U.S.C. section 360bbb-3(b)(1), unless the authorization is terminated or revoked sooner.  Performed at [facility], [address] [place], [place] [address]   CULTURE, BLOOD (ROUTINE X 2) w Reflex to ID Panel     Status: None   Collection Time: 04/14/20  8:14 AM   Specimen: BLOOD  Result Value Ref Range Status   Specimen Description BLOOD LEFT [facility]  Final   Special Requests   Final    BOTTLES DRAWN AEROBIC AND ANAEROBIC Blood Culture adequate volume   Culture   Final    NO GROWTH 5 DAYS Performed at [facility], [address] [place], [place] [address]    Report Status 04/19/2020 FINAL  Final  CULTURE, BLOOD (ROUTINE X 2) w Reflex to ID Panel     Status: None   Collection Time: 04/14/20  8:14 AM   Specimen: BLOOD  Result Value Ref Range Status   Specimen Description BLOOD LEFT WRIST  Final   Special Requests   Final    BOTTLES DRAWN AEROBIC AND ANAEROBIC Blood Culture adequate volume   Culture   Final    NO GROWTH 5 DAYS Performed at [facility], [address] [place], [place] [address]    Report Status 04/19/2020 FINAL  Final  Aerobic/Anaerobic Culture (surgical/deep wound)     Status: None   Collection Time: 04/14/20 11:14 AM   Specimen: Abscess  Result Value Ref Range Status   Specimen Description   Final    ABSCESS Performed at [facility], [address], [place], [place] [address]    Special Requests   Final    NONE Performed at [facility], [address] [place], [place] [address]    Gram Stain   Final    NO WBC SEEN ABUNDANT  Lonell Grandchild NEGATIVE RODS FEW GRAM POSITIVE COCCI FEW GRAM POSITIVE RODS Performed at [facility], [place] [address], [place], [place] [address]    Culture   Final    ABUNDANT KLEBSIELLA PNEUMONIAE ABUNDANT PSEUDOMONAS AERUGINOSA MODERATE ESCHERICHIA COLI Confirmed Extended Spectrum Beta-Lactamase Producer (ESBL).  In bloodstream infections from ESBL organisms, carbapenems are preferred over piperacillin/tazobactam. They are shown to have a lower risk of mortality. MIXED ANAEROBIC FLORA PRESENT.  CALL LAB IF FURTHER IID REQUIRED.    Report Status 04/20/2020 FINAL  Final   Organism ID, Bacteria KLEBSIELLA PNEUMONIAE  Final   Organism ID, Bacteria PSEUDOMONAS AERUGINOSA  Final   Organism ID, Bacteria ESCHERICHIA COLI  Final      Susceptibility   Escherichia coli - MIC*    AMPICILLIN >=32 RESISTANT Resistant     CEFAZOLIN >=64 RESISTANT Resistant     CEFEPIME 16 RESISTANT Resistant     CEFTAZIDIME RESISTANT Resistant     CEFTRIAXONE >=64 RESISTANT Resistant     CIPROFLOXACIN >=4 RESISTANT Resistant     GENTAMICIN <=1 SENSITIVE Sensitive     IMIPENEM <=0.25 SENSITIVE Sensitive     TRIMETH/SULFA >=320 RESISTANT Resistant     AMPICILLIN/SULBACTAM >=32 RESISTANT Resistant     PIP/TAZO 8 SENSITIVE Sensitive     * MODERATE ESCHERICHIA COLI   Klebsiella pneumoniae - MIC*    AMPICILLIN >=32 RESISTANT Resistant     CEFAZOLIN <=4 SENSITIVE Sensitive     CEFEPIME <=0.12 SENSITIVE Sensitive     CEFTAZIDIME <=1 SENSITIVE Sensitive     CEFTRIAXONE <=0.25 SENSITIVE Sensitive     CIPROFLOXACIN <=0.25 SENSITIVE Sensitive     GENTAMICIN <=1 SENSITIVE Sensitive     IMIPENEM <=0.25 SENSITIVE Sensitive     TRIMETH/SULFA <=20 SENSITIVE Sensitive     AMPICILLIN/SULBACTAM 16 INTERMEDIATE Intermediate     PIP/TAZO 8 SENSITIVE Sensitive     * ABUNDANT KLEBSIELLA PNEUMONIAE   Pseudomonas aeruginosa - MIC*     CEFTAZIDIME 4 SENSITIVE Sensitive     CIPROFLOXACIN <=0.25 SENSITIVE Sensitive     GENTAMICIN 2 SENSITIVE Sensitive     IMIPENEM 2 SENSITIVE Sensitive     PIP/TAZO 8 SENSITIVE Sensitive     CEFEPIME 4 SENSITIVE Sensitive     * ABUNDANT PSEUDOMONAS AERUGINOSA  MRSA PCR Screening     Status: None   Collection Time: 04/14/20  6:19 PM   Specimen: Nasopharyngeal  Result Value Ref Range Status   MRSA by PCR NEGATIVE NEGATIVE Final    Comment:        The GeneXpert MRSA Assay (FDA approved for NASAL specimens only), is one component of a comprehensive MRSA colonization surveillance program. It is not intended to diagnose MRSA infection nor to guide or monitor treatment for MRSA infections. Performed at [facility], [address] [place], [place] [address]   Urine Culture     Status: None   Collection Time: 04/15/20  4:28 AM   Specimen: Urine, Random  Result Value Ref Range Status   Specimen Description   Final    URINE, RANDOM Performed at [facility], [address], [place], [place] [address]    Special Requests   Final    NONE Performed at [facility], [address], [place], [place] [address]    Culture   Final    NO GROWTH Performed at [facility], [place] [address], [place], [place] [address]    Report Status 04/16/2020 FINAL  Final  CULTURE, BLOOD (ROUTINE X 2)  w Reflex to ID Panel     Status: None   Collection Time: 04/22/20  6:12 PM   Specimen: BLOOD  Result Value Ref Range Status   Specimen Description BLOOD FOOT  Final   Special Requests   Final    BOTTLES DRAWN AEROBIC AND ANAEROBIC Blood Culture adequate volume   Culture   Final    NO GROWTH 5 DAYS Performed at [facility], [address] [place], [place] [address]    Report Status 04/27/2020 FINAL  Final  CULTURE, BLOOD (ROUTINE X 2) w Reflex to ID Panel     Status: None   Collection Time: 04/22/20  6:17 PM   Specimen: BLOOD  Result Value Ref Range  Status   Specimen Description BLOOD FOOT  Final   Special Requests   Final    BOTTLES DRAWN AEROBIC AND ANAEROBIC Blood Culture results may not be optimal due to an excessive volume of blood received in culture bottles   Culture   Final    NO GROWTH 5 DAYS Performed at [facility], [address] [place], [place] [address]    Report Status 04/27/2020 FINAL  Final  CULTURE, BLOOD (ROUTINE X 2) w Reflex to ID Panel     Status: None   Collection Time: 05/01/20  2:50 PM   Specimen: BLOOD  Result Value Ref Range Status   Specimen Description BLOOD BLOOD LEFT HAND  Final   Special Requests   Final    BOTTLES DRAWN AEROBIC AND ANAEROBIC Blood Culture adequate volume   Culture   Final    NO GROWTH 5 DAYS Performed at [facility], [address] [place], [place] [address]    Report Status 05/06/2020 FINAL  Final  CULTURE, BLOOD (ROUTINE X 2) w Reflex to ID Panel     Status: None   Collection Time: 05/01/20  2:51 PM   Specimen: BLOOD  Result Value Ref Range Status   Specimen Description BLOOD BLOOD RIGHT HAND  Final   Special Requests   Final    BOTTLES DRAWN AEROBIC AND ANAEROBIC Blood Culture adequate volume   Culture   Final    NO GROWTH 5 DAYS Performed at [facility], [address] [place], [place] [address]    Report Status 05/06/2020 FINAL  Final    Coagulation Studies: Recent Labs    05/15/20 0443  LABPROT 14.7  INR 1.2    Urinalysis: No results for input(s): COLORURINE, LABSPEC, PHURINE, GLUCOSEU, HGBUR, BILIRUBINUR, KETONESUR, PROTEINUR, UROBILINOGEN, NITRITE, LEUKOCYTESUR in the last 72 hours.  Invalid input(s): APPERANCEUR    Imaging: No results found.   Medications:   . sodium chloride 250 mL (05/03/20 0043)  . sodium chloride Stopped (05/12/20 0620)  . anidulafungin Stopped (05/15/20 0351)  . meropenem (MERREM) IV 200 mL/hr at 05/15/20 0425   . acidophilus  1 capsule Oral Daily  . alteplase  2 mg Intracatheter Once   . alteplase  2 mg Intracatheter Once  . amiodarone  200 mg Oral BID  . Chlorhexidine Gluconate Cloth  6 each Topical Daily  . rilpivirine  25 mg Oral Q breakfast   And  . dolutegravir  50 mg Oral Q breakfast  . dronabinol  5 mg Oral BID AC  . epoetin (EPOGEN/PROCRIT) injection  10,000 Units Intravenous Q M,W,F-HD  . famotidine  10 mg Oral Q1200  . feeding supplement (ENSURE ENLIVE)  237 mL Oral TID BM  . guaiFENesin-dextromethorphan  10 mL Oral Q8H  .  magnesium oxide  400 mg Oral Daily  . mometasone-formoterol  2 puff Inhalation BID  . multivitamin  1 tablet Oral QHS  . multivitamin with minerals  1 tablet Oral Daily  . potassium chloride  40 mEq Oral Once  . sodium chloride flush  5 mL Intracatheter Q8H  . torsemide  40 mg Oral Daily   sodium chloride, acetaminophen **OR** acetaminophen, albuterol, ALPRAZolam, bisacodyl, diphenhydrAMINE, docusate sodium, heparin NICU/SCN flush, HYDROmorphone (DILAUDID) injection, iohexol, ipratropium-albuterol, magic mouthwash **AND** lidocaine, magnesium hydroxide, menthol-cetylpyridinium, metoprolol tartrate, oxyCODONE-acetaminophen, phenol, simethicone  Assessment/ Plan:  Melanie Bowen is a 58 y.o. black female with HIV, COPD, hypertension, GERD, who is admitted to [facility] on 04/14/2020 for Dyspnea [R06.00] Colonic diverticular abscess [K57.20] Diverticulitis of large intestine with abscess without bleeding [K57.20] Abscess of sigmoid colon due to diverticulitis [K57.20]  #Acute renal failure   -Patient remains dialysis dependent at the moment.  We will plan for hemodialysis again tomorrow.  Lab Results  Component Value Date   CREATININE 2.32 (H) 05/15/2020   CREATININE 2.91 (H) 05/14/2020   CREATININE 2.69 (H) 05/13/2020   Outpatient chair at [facility] MWF schedule.   #. Anemia of chronic kidney disease:  Lab Results  Component Value Date   HGB 7.0 (L) 05/15/2020  Hemoglobin is drifted down to 7.0.  Consider blood  transfusion but defer to primary team.  #Fever Receiving Anidulafungin (started on 05/03/20) meropenem IV (started on 05/10/20)   # Hypotension Blood pressure improved today to 112/67.  Blood pressure.  Hold off on midodrine at this time.      LOS: 31 Avyn Aden 9/16/202111:18 AM

## 2020-05-15 NOTE — Progress Notes (Addendum)
[facility] SURGICAL PROGRESS NOTE (cpt [phone])  Hospital Day(s): 31.   Post op day(s): 20 Days Post-Op.   Interval History:  Patient seen and examined no acute events or new complaints overnight.  Patient resting in bed, fairly somnolent, arouses to verbal stimuli but does not participate in history/examination. Leukocytosis continues to spike, now up to 28.8K; she remained afebrile  Renal function elevated but continues to slowly improve, sCr now 2.32 She does have hypokalemia to 3.0 this morning Plan to upsize intra-abdominal drain this morning to better control drainage She has been tolerating PO and having bowel function, currently NPO  Review of Systems:  Limited secondary to patient participation/somnolence   Vital signs in last 24 hours: [min-max] current  Temp:  [98.8 F (37.1 C)] 98.8 F (37.1 C) (09/16 0513) Pulse Rate:  [88-90] 90 (09/16 0513) Resp:  [18-22] 18 (09/16 0513) BP: (105-112)/(50-67) 112/67 (09/16 0513) SpO2:  [92 %-96 %] 94 % (09/16 0513) Weight:  [124.3 kg] 124.3 kg (09/15 1349)     Height: 5\' 6"  (167.6 cm) Weight: 124.3 kg BMI (Calculated): 44.26   Intake/Output last 2 shifts:  09/15 0701 - 09/16 0700 In: 499.1 [P.O.:60; I.V.:5; IV Piggyback:414.1] Out: 38 [Drains:38]   Physical Exam:  Constitutional: alert, cooperative and no distress  HENT: normocephalic without obvious abnormality  Eyes: PERRL, EOM's grossly intact and symmetric  Respiratory: breathing non-labored at rest  Cardiovascular: regular rate and sinus rhythm  Gastrointestinal: soft, tenderness over suprapubic drain site, I am unable to illicit any additional tenderness, and non-distended, no rebound/guarding, certainly without evidence of peritonitis, JP in LLQ output now appears feculent  Musculoskeletal: no edema or wounds, motor and sensation grossly intact, NT    Labs:  CBC Latest Ref Rng & Units 05/14/2020 05/13/2020 05/12/2020  WBC 4.0 - 10.5 K/uL 19.9(H)  14.3(H) 5.4  Hemoglobin 12.0 - 15.0 g/dL 7.4(L) 7.3(L) 8.0(L)  Hematocrit 36 - 46 % 22.4(L) 22.6(L) 25.1(L)  Platelets 150 - 400 K/uL 381 390 450(H)   CMP Latest Ref Rng & Units 05/15/2020 05/14/2020 05/13/2020  Glucose 70 - 99 mg/dL 96 82 86  BUN 6 - 20 mg/dL 26(H) 39(H) 33(H)  Creatinine 0.44 - 1.00 mg/dL 2.32(H) 2.91(H) 2.69(H)  Sodium 135 - 145 mmol/L 137 138 138  Potassium 3.5 - 5.1 mmol/L 3.0(L) 3.8 3.5  Chloride 98 - 111 mmol/L 100 101 101  CO2 22 - 32 mmol/L 27 25 26   Calcium 8.9 - 10.3 mg/dL 7.4(L) 7.4(L) 7.3(L)  Total Protein 6.5 - 8.1 g/dL - 5.3(L) -  Total Bilirubin 0.3 - 1.2 mg/dL - 1.3(H) -  Alkaline Phos 38 - 126 U/L - 78 -  AST 15 - 41 U/L - 25 -  ALT 0 - 44 U/L - 23 -    Imaging studies: No new pertinent imaging studies   Assessment/Plan: (ICD-10's: K66.20) 59 y.o. female with sigmoid diverticulitis with abscess s/p percutaneous drainage on 08/16andshe continues to bewithout any evidence of peritonitis/pneumoperitoneum/pneumatosis on examination or imaging however her drain output has now become feculent,complicated by pertinent comorbidities includingmarked COPD   - We will plan for up-sizing intra-abdominal drain to better control output with IR. I did discuss this plan and our thought process with her daughter via phone last night.  - Her rising leukocytosis is concerning, but again she remains hemodynamically stable, afebrile, and is without any evidence of peritonitis on examination. 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 We will attempt to upsize her  drain today in attempt to get better source control of her drainage and collapse tissue around this. I do think the drain will be her best option. She is at high risk for complications from surgical intervention and may requiring prolonged intubation post-operatively giving her respiratory comorbidities and this goes against her advanced directives/wishes. We  will of course continue to closely follow and reassess should her clinical condition change.    - Continue IV Abx(meropenem); ID followingand appreciate assistance; Cx withpseudomonas, kleb [email or web address] - Monitor abdominal examination; leukocytosis - Pain control prn; antiemetics prn              - Appreciate nephrology assistance and recommendations - Continue to work with therapies; suspect she will need significant rehab - Further management per primary service; we will continue to follow    All of the above findings and recommendations were discussed with the patient, and the medical team, and all of patient's questions were answered to her expressed satisfaction.  -- Edison Simon, PA-C [facility] 05/15/2020, 7:26 AM [phone] M-F: 7am - 4pm

## 2020-05-15 NOTE — Procedures (Signed)
Interventional Radiology Procedure Note  Procedure:  1) CT guided aspiration of left intraabdominal fluid collection 2) CT guided drain upsize of abdominal drain  Complications: None  Estimated Blood Loss: < 10 mL  Findings: Aspirate performed from left abdominal fluid collection yielding serous fluid, non-purulent.  Approximately 250 mL aspirated, sample sent for culture.  Successful exchange of indwelling lower abdominal drain for 14 Fr drain. Drain attached to suction bulb drainage.  Plan: Keep to bulb suction. Please record output from drain. Follow up fluid aspirate culture. Interventional Radiology will follow.   Ruthann Cancer, MD

## 2020-05-15 NOTE — Progress Notes (Signed)
PT Cancellation Note  Patient Details Name: Melanie Bowen MRN: [id number] DOB: [birth year]   Cancelled Treatment:    Reason Eval/Treat Not Completed: Other (comment). Pt currently pending procedure this date. Due to change in status, will need new orders to resume care s/p procedure. There is also discussion of possible comfort care. Please re-order if new acute PT needs are warranted.   Symphanie Cederberg 05/15/2020, 10:01 AM  Greggory Stallion, PT, DPT [phone]

## 2020-05-15 NOTE — Progress Notes (Signed)
Patient clinically stable post Abscess Drain upsize per Dr Serafina Royals, tolerated well. Received Fentanyl 50mcg IV for procedure. Tolerated well. Dressing dry and intact. Denies complaints at this time. Awake. Remains disoriented at times which is unchanged from pre procedure. Report given to [employer] in specials.

## 2020-05-15 NOTE — Progress Notes (Signed)
ID Underwent upsizing of the left lower quadrant drain IR aspirated another collection and found that to be serous- sent for culture Once source control is achieved, fever and leucocytosis resolved  will stop antibiotics

## 2020-05-15 NOTE — Progress Notes (Addendum)
Progress Note    Melanie Bowen  BHA:[id number] DOB: 20-May-1961  DOA: 04/14/2020 PCP: Theotis Burrow, MD      Brief Narrative:    Medical records reviewed and are as summarized below:  Melanie Bowen is a 59 y.o. female with medical history significant for hypertension, diverticulosis, enteric infection, morbid obesity, GERD, COPD, history of stroke, asthma, depression, seizures enzymes with acute onset of abdominal pain nausea vomiting and constipation.   She was admitted to the hospital for septic shock secondary to acute diverticulitis with diverticular abscess.  She was treated with IV fluids and empiric IV antimicrobial therapy.  She required vasopressors for septic shock.  She was seen by interventional radiologist and CT-guided drainage was done by IR on 04/14/2020.  Fluid culture showed Pseudomonas aeruginosa, Klebsiella pneumonia and E. coli.  Septic shock was complicated by acute kidney injury, acute liver failure, acute metabolic encephalopathy and acute hypoxemic respiratory failure.  She was seen by the nephrologist for AKI.  Patient was started on hemodialysis for AKI. She developed atrial fibrillation with RVR.  She was seen in consultation by the cardiologist as well      Assessment/Plan:   Principal Problem:   Severe sepsis with septic shock ([place]) Active Problems:   HTN (hypertension)   HIV (human immunodeficiency virus infection) ([place])   Diverticulitis of large intestine with abscess without bleeding   Abscess of sigmoid colon due to diverticulitis   Acute renal failure (ARF) ([place])   Hypotension   Diverticulosis   Acute respiratory distress   DNR (do not resuscitate) discussion   Palliative care by specialist   Dyspnea   Goals of care, counseling/discussion   Pressure injury of skin   Nutrition Problem: Increased nutrient needs Etiology: chronic illness (COPD, new HD)  Signs/Symptoms: estimated needs   Body mass index is 44.24 kg/m.   (Morbid obesity)  Septic shock - resolved  Acute sigmoid diverticulitis and diverticular abscess, significant leukocytosis- s/p CT-guided drainage by IR on 04/14/2020.  Fluid culture showed Klebsiella pneumonia, pseudomonas aeruginosa and E. Coli.  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  No acute abnormality on CT head on 05/02/2020  History of stroke    PLAN  Plan for IR to evaluate and upsize her abdominal JP drain today.  Continue IV meropenem and anidulafungin (of note, patient had IV meropenem from 04/15/2020 to 05/06/2020 and meropenem was restarted on 05/09/2020.  She also had a anidulafungin from 04/15/2020 to 04/22/2020 and this was restarted on 05/02/2020)  Transfuse 1 units of PRBCs today for hemoglobin of 7.  Monitor H&H S/p transfusion with 1 unit of PRBCs on 05/01/2020, 05/04/2020 and 05/09/2020.  Continue amiodarone for A. fib Continue bronchodilators for COPD Continue antiretroviral therapy Follow-up with nephrologist for hemodialysis Follow-up with ID, general surgery and interventional radiologist.  Overall, prognosis is poor.  Plan discussed with her daughter, [name] and her sister, Lynelle Smoke, via a three-way phone call   Diet Order            Diet NPO time specified  Diet effective midnight                    Consultants:  General surgeon  Cardiologist  Nephrologist  Vascular surgeon  Interventional radiologist  Procedures:  CT-guided drainage of diverticular abscess on 04/14/2020    Medications:   . acidophilus  1 capsule Oral Daily  . alteplase  2 mg  Intracatheter Once  . alteplase  2 mg Intracatheter Once  . amiodarone  200 mg Oral BID  . Chlorhexidine Gluconate Cloth  6 each Topical Daily  . rilpivirine  25 mg Oral Q breakfast   And  . dolutegravir  50 mg Oral Q breakfast  . dronabinol  5 mg Oral BID AC  . epoetin (EPOGEN/PROCRIT) injection  10,000 Units Intravenous Q M,W,F-HD  . famotidine  10 mg Oral Q1200  . feeding supplement (ENSURE ENLIVE)  237 mL Oral TID BM  . guaiFENesin-dextromethorphan  10 mL Oral Q8H  . magnesium oxide  400 mg Oral Daily  . mometasone-formoterol  2 puff Inhalation BID  . multivitamin  1 tablet Oral QHS  . multivitamin with minerals  1 tablet Oral Daily  . potassium chloride  40 mEq Oral Once  . sodium chloride flush  5 mL Intracatheter Q8H  . torsemide  40 mg Oral Daily   Continuous Infusions: . sodium chloride 250 mL (05/03/20 0043)  . sodium chloride Stopped (05/12/20 0620)  . anidulafungin Stopped (05/15/20 0351)  . meropenem (MERREM) IV 200 mL/hr at 05/15/20 0425     Anti-infectives (From admission, onward)   Start     Dose/Rate Route Frequency Ordered Stop   05/10/20 2200  meropenem (MERREM) 500 mg in sodium chloride 0.9 % 100 mL IVPB  Status:  Discontinued        500 mg 200 mL/hr over 30 Minutes Intravenous Every 12 hours 05/10/20 1324 05/10/20 1328   05/10/20 2200  meropenem (MERREM) 500 mg in sodium chloride 0.9 % 100 mL IVPB        500 mg 200 mL/hr over 30 Minutes Intravenous Daily 05/10/20 1328     05/09/20 2200  meropenem (MERREM) 1 g in sodium chloride 0.9 % 100 mL IVPB  Status:  Discontinued        1 g 200 mL/hr over 30 Minutes Intravenous Every 12 hours 05/09/20 2121 05/10/20 1324   05/07/20 1300  rilpivirine (EDURANT) tablet 25 mg       "And" Linked Group Details   25 mg Oral Daily with breakfast 05/07/20 1245     05/07/20 1300  dolutegravir (TIVICAY) tablet 50 mg       "And" Linked Group Details   50 mg Oral Daily with breakfast 05/07/20 1245     05/06/20 0956  meropenem  (MERREM) 500 mg in sodium chloride 0.9 % 100 mL IVPB  Status:  Discontinued        500 mg 200 mL/hr over 30 Minutes Intravenous Every 24 hours 05/05/20 1018 05/05/20 1637   05/03/20 2000  anidulafungin (ERAXIS) 100 mg in sodium chloride 0.9 % 100 mL IVPB        100 mg 78 mL/hr over 100 Minutes Intravenous Every 24 hours 05/02/20 2223     05/02/20 2330  anidulafungin (ERAXIS) 200 mg in sodium chloride 0.9 % 200 mL IVPB        200 mg 78 mL/hr over 200 Minutes Intravenous  Once 05/02/20 2223 05/03/20 0455   04/30/20 0800  rilpivirine (EDURANT) tablet 25 mg  Status:  Discontinued       "And" Linked Group Details   25 mg Oral Daily with breakfast 04/29/20 1254 05/02/20 2223  04/30/20 0800  dolutegravir (TIVICAY) tablet 50 mg  Status:  Discontinued       "And" Linked Group Details   50 mg Oral Daily with breakfast 04/29/20 1254 05/02/20 2223   04/28/20 2200  meropenem (MERREM) 500 mg in sodium chloride 0.9 % 100 mL IVPB  Status:  Discontinued        500 mg 200 mL/hr over 30 Minutes Intravenous Every 12 hours 04/28/20 1546 05/05/20 1018   04/26/20 2200  meropenem (MERREM) 500 mg in sodium chloride 0.9 % 100 mL IVPB  Status:  Discontinued        500 mg 200 mL/hr over 30 Minutes Intravenous Every 8 hours 04/26/20 1258 04/28/20 1546   04/25/20 1700  rilpivirine (EDURANT) tablet 25 mg  Status:  Discontinued        25 mg Oral Daily with breakfast 04/24/20 1636 04/25/20 1111   04/25/20 1200  rilpivirine (EDURANT) tablet 25 mg  Status:  Discontinued        25 mg Oral Daily with breakfast 04/25/20 1111 04/29/20 1254   04/25/20 1000  meropenem (MERREM) 500 mg in sodium chloride 0.9 % 100 mL IVPB  Status:  Discontinued        500 mg 200 mL/hr over 30 Minutes Intravenous Every 12 hours 04/25/20 0935 04/26/20 1258   04/24/20 1700  darunavir-cobicistat (PREZCOBIX) 800-150 MG per tablet 1 tablet  Status:  Discontinued        1 tablet Oral Daily with breakfast 04/24/20 1231 04/24/20 1636   04/24/20 1330   dolutegravir (TIVICAY) tablet 50 mg  Status:  Discontinued        50 mg Oral Daily 04/24/20 1231 04/29/20 1254   04/23/20 2200  meropenem (MERREM) 500 mg in sodium chloride 0.9 % 100 mL IVPB  Status:  Discontinued        500 mg 200 mL/hr over 30 Minutes Intravenous Every 24 hours 04/23/20 1127 04/25/20 0935   04/18/20 2000  levofloxacin (LEVAQUIN) IVPB 750 mg        750 mg 100 mL/hr over 90 Minutes Intravenous  Once 04/18/20 1857 04/19/20 0004   04/18/20 1800  levofloxacin (LEVAQUIN) IVPB 750 mg  Status:  Discontinued        750 mg 100 mL/hr over 90 Minutes Intravenous  Once 04/18/20 1640 04/18/20 1857   04/16/20 2200  anidulafungin (ERAXIS) 100 mg in sodium chloride 0.9 % 100 mL IVPB        100 mg 78 mL/hr over 100 Minutes Intravenous Every 24 hours 04/16/20 1707 04/22/20 0211   04/15/20 2200  valACYclovir (VALTREX) tablet 500 mg  Status:  Discontinued        500 mg Oral Daily at bedtime 04/15/20 1248 04/16/20 1259   04/15/20 2200  anidulafungin (ERAXIS) 200 mg in sodium chloride 0.9 % 200 mL IVPB        200 mg 78 mL/hr over 200 Minutes Intravenous  Once 04/15/20 2008 04/16/20 0327   04/15/20 2200  meropenem (MERREM) 1 g in sodium chloride 0.9 % 100 mL IVPB  Status:  Discontinued        1 g 200 mL/hr over 30 Minutes Intravenous Every 12 hours 04/15/20 2017 04/23/20 1127   04/15/20 1400  piperacillin-tazobactam (ZOSYN) IVPB 3.375 g  Status:  Discontinued        3.375 g 12.5 mL/hr over 240 Minutes Intravenous Every 8 hours 04/15/20 1300 04/15/20 2006   04/14/20 2200  elvitegravir-cobicistat-emtricitabine-tenofovir (GENVOYA) 150-150-200-10 MG tablet 1 tablet  Status:  Discontinued        1 tablet Oral Daily at bedtime 04/14/20 0235 04/15/20 0957   04/14/20 2200  valACYclovir (VALTREX) tablet 1,000 mg  Status:  Discontinued        1,000 mg Oral Daily at bedtime 04/14/20 0235 04/15/20 1248   04/14/20 1230  cefTRIAXone (ROCEPHIN) 2 g in sodium chloride 0.9 % 100 mL IVPB  Status:  Discontinued         2 g 200 mL/hr over 30 Minutes Intravenous Every 24 hours 04/14/20 1223 04/15/20 1300   04/14/20 1230  metroNIDAZOLE (FLAGYL) IVPB 500 mg  Status:  Discontinued        500 mg 100 mL/hr over 60 Minutes Intravenous Every 8 hours 04/14/20 1223 04/15/20 1636   04/14/20 1030  piperacillin-tazobactam (ZOSYN) IVPB 3.375 g  Status:  Discontinued        3.375 g 12.5 mL/hr over 240 Minutes Intravenous Every 8 hours 04/14/20 0235 04/14/20 1223   04/14/20 0215  piperacillin-tazobactam (ZOSYN) IVPB 4.5 g  Status:  Discontinued        4.5 g 200 mL/hr over 30 Minutes Intravenous  Once 04/14/20 0200 04/14/20 0208   04/14/20 0215  piperacillin-tazobactam (ZOSYN) IVPB 3.375 g        3.375 g 100 mL/hr over 30 Minutes Intravenous  Once 04/14/20 0208 04/14/20 0301             Family Communication/Anticipated D/C date and plan/Code Status   DVT prophylaxis:      Code Status: Partial Code  Family Communication:  Disposition Plan:    Status is: Inpatient  Remains inpatient appropriate because:IV treatments appropriate due to intensity of illness or inability to take PO and Inpatient level of care appropriate due to severity of illness   Dispo: The patient is from: Home              Anticipated d/c is to: SNF              Anticipated d/c date is: > 3 days              Patient currently is not medically stable to d/c.           Subjective:   C/o abdominal pain.  Objective:    Vitals:   05/14/20 1100 05/14/20 1349 05/14/20 2125 05/15/20 0513  BP:   (!) 105/50 112/67  Pulse:   88 90  Resp:   (!) 22 18  Temp:   98.8 F (37.1 C) 98.8 F (37.1 C)  TempSrc:   Oral Oral  SpO2: 96%  92% 94%  Weight:  124.3 kg    Height:       No data found.   Intake/Output Summary (Last 24 hours) at 05/15/2020 1002 Last data filed at 05/15/2020 0530 Gross per 24 hour  Intake 439.08 ml  Output 38 ml  Net 401.08 ml   Filed Weights   05/03/20 0547 05/09/20 0951 05/14/20 1349   Weight: 130.9 kg 119 kg 124.3 kg    Exam:   GEN: NAD SKIN: Raw area is more prominent on right buttock.  Raw areas on the left buttock are healing. EYES: Pale, anicteric ENT: MMM CV: RRR PULM: CTA B ABD: soft, obese/distended, NT, +BS, + JP drain with feculent material CNS: AAO x 1 (person), non focal EXT: Bilateral leg edema is improving.  No tenderness    Data Reviewed:   I have personally reviewed following labs and imaging studies:  Labs: Labs show  the following:   Basic Metabolic Panel: Recent Labs  Lab 05/08/20 1137 05/08/20 1137 05/09/20 0930 05/09/20 0930 05/10/20 0605 05/10/20 0605 05/11/20 0708 05/11/20 0708 05/12/20 0744 05/12/20 0744 05/13/20 0728 05/13/20 0728 05/14/20 0604 05/15/20 0443  NA 139   < > 143   < > 141   < > 141  --  140  --  138  --  138 137  K 3.7   < > 3.6   < > 3.7   < > 3.7   < > 3.8   < > 3.5   < > 3.8 3.0*  CL 98   < > 101   < > 102   < > 102  --  101  --  101  --  101 100  CO2 27   < > 30   < > 27   < > 28  --  27  --  26  --  25 27  GLUCOSE 80   < > 85   < > 77   < > 70  --  72  --  86  --  82 96  BUN 54*   < > 60*   < > 41*   < > 45*  --  52*  --  33*  --  39* 26*  CREATININE 3.87*   < > 3.74*   < > 2.96*   < > 3.45*  --  3.66*  --  2.69*  --  2.91* 2.32*  CALCIUM 7.3*   < > 7.3*   < > 7.6*   < > 7.5*  --  7.5*  --  7.3*  --  7.4* 7.4*  MG 1.8  --  2.1  --  2.1  --  2.0  --  2.0  --   --   --   --   --    < > = values in this interval not displayed.   GFR Estimated Creatinine Clearance: 35.6 mL/min (A) (by C-G formula based on SCr of 2.32 mg/dL (H)). Liver Function Tests: Recent Labs  Lab 05/14/20 0604  AST 25  ALT 23  ALKPHOS 78  BILITOT 1.3*  PROT 5.3*  ALBUMIN 1.1*   No results for input(s): LIPASE, AMYLASE in the last 168 hours. No results for input(s): AMMONIA in the last 168 hours. Coagulation profile Recent Labs  Lab 05/15/20 0443  INR 1.2    CBC: Recent Labs  Lab 05/11/20 0708 05/12/20 0744  05/13/20 0728 05/14/20 0604 05/15/20 0444  WBC 2.9* 5.4 14.3* 19.9* 28.8*  HGB 7.5* 8.0* 7.3* 7.4* 7.0*  HCT 23.2* 25.1* 22.6* 22.4* 22.6*  MCV 90.3 91.3 90.8 91.4 93.4  PLT 405* 450* 390 381 338   Cardiac Enzymes: No results for input(s): CKTOTAL, CKMB, CKMBINDEX, TROPONINI in the last 168 hours. BNP (last 3 results) No results for input(s): PROBNP in the last 8760 hours. CBG: No results for input(s): GLUCAP in the last 168 hours. D-Dimer: No results for input(s): DDIMER in the last 72 hours. Hgb A1c: No results for input(s): HGBA1C in the last 72 hours. Lipid Profile: No results for input(s): CHOL, HDL, LDLCALC, TRIG, CHOLHDL, LDLDIRECT in the last 72 hours. Thyroid function studies: No results for input(s): TSH, T4TOTAL, T3FREE, THYROIDAB in the last 72 hours.  Invalid input(s): FREET3 Anemia work up: No results for input(s): VITAMINB12, FOLATE, FERRITIN, TIBC, IRON, RETICCTPCT in the last 72 hours. Sepsis Labs: Recent Labs  Lab 05/12/20 0744 05/13/20 0728 05/14/20  0604 05/15/20 0444  WBC 5.4 14.3* 19.9* 28.8*    Microbiology No results found for this or any previous visit (from the past 240 hour(s)).  Procedures and diagnostic studies:  No results found.             LOS: 31 days   [place] [employer], advertising on [email or web address]. If 7PM-7AM, please contact night-coverage at [email or web address]     05/15/2020, 10:02 AM

## 2020-05-15 NOTE — Consult Note (Signed)
Chief Complaint: Patient was seen in consultation today for image guided drainage of intraabdominal abcess at the request of Edison Simon, PA-C.    Referring Physician(s): Edison Simon, PA-C  Patient Status: [facility] - In-pt  History of Present Illness: Melanie Bowen is a 59 y.o. female with history of sigmoid diverticulitis complicated by abscess formation status post percutaneous drain placement on 04/14/20.  Per Surgery, her drain output has become feculent, and she has an increasing leukocytosis.  CT abdomen pelvis on 05/12/20 demonstrates partial retraction of the indwelling lower abdominal drain, with the radiopaque marker external to the rectus musculature.  Additionally, she has developed several new fluid collections in the peritoneum, largest in the left abdomen, just anterior to the left colon.  The patient currently has altered mental status, and consent was obtained from the patient's daughter.  Past Medical History:  Diagnosis Date   Acute GI hemorrhage 12/2015   required transfusion   Allergic rhinitis    Arthritis    DDD   Asthma    Blepharitis    Bronchitis    Cigarette smoker    Claustrophobia    COPD (chronic obstructive pulmonary disease) (HCC)    Cough    DDD (degenerative disc disease), lumbar 04/10/2014   Depression    Diverticulosis    Ectopic pregnancy    Eye irritation    Genital herpes    GERD (gastroesophageal reflux disease)    Headache    HIV (human immunodeficiency virus infection) ([place])    Hypertension    Paresthesia of hand, bilateral    Shortness of breath dyspnea    at times due to asthma    Past Surgical History:  Procedure Laterality Date   BACK SURGERY  12/2014   lumbar lam. Dr. Deri Fuelling   BACK SURGERY  12/25/2012   Removal lumbosubarachnoid shunt system, L5-S1 TF ESI, Dr. Virl Axe Minchew   back surgery may 2016     lumbar    BUNIONECTOMY Bilateral    feet   COLONOSCOPY  12/06/2014    rescheduled from 11/01/2014 due to poor prep   DIAGNOSTIC LAPAROSCOPY     DIALYSIS/PERMA CATHETER INSERTION N/A 04/25/2020   Procedure: DIALYSIS/PERMA CATHETER INSERTION;  Surgeon: Algernon Huxley, MD;  Location: [facility];  Service: Cardiovascular;  Laterality: N/A;   DISTAL FEMUR OSTEOTOMY Right    Dr. Earnestine Leys   ESOPHAGOGASTRODUODENOSCOPY Left 01/11/2016   Procedure: ESOPHAGOGASTRODUODENOSCOPY (EGD);  Surgeon: Hulen Luster, MD;  Location: [facility];  Service: Endoscopy;  Laterality: Left;   Finger fracture Right    5th finger   FRACTURE SURGERY Right 01/25/2014   closed reduction & percutaneous pinning of 5th digit   HAND SURGERY Bilateral    carpal tunnel   JOINT REPLACEMENT Bilateral    knees   KNEE SURGERY Bilateral 2003,2007   total Joint   LAPAROSCOPY FOR ECTOPIC PREGNANCY     PERIPHERAL VASCULAR CATHETERIZATION N/A 01/12/2016   Procedure: Visceral Angiography;  Surgeon: Algernon Huxley, MD;  Location: [facility];  Service: Cardiovascular;  Laterality: N/A;   PERIPHERAL VASCULAR CATHETERIZATION N/A 01/12/2016   Procedure: Visceral Artery Intervention;  Surgeon: Algernon Huxley, MD;  Location: [facility];  Service: Cardiovascular;  Laterality: N/A;   REVISION TOTAL KNEE ARTHROPLASTY Right 12/31/2002   Dr. Marry Guan   TUBAL LIGATION     VIDEO BRONCHOSCOPY N/A 06/30/2015   Procedure: VIDEO BRONCHOSCOPY WITHOUT FLUORO;  Surgeon: Vilinda Boehringer, MD;  Location: [facility];  Service: Cardiopulmonary;  Laterality: N/A;    Allergies: Acetaminophen, Ibuprofen, Gabapentin, Morphine, Morphine and related, and Zanaflex  [tizanidine]  Medications: Prior to Admission medications   Medication Sig Start Date End Date Taking? Authorizing Provider  amLODipine (NORVASC) 10 MG tablet Take 10 mg by mouth daily.    Yes [provider]  DEXILANT 60 MG capsule Take 60 mg by mouth daily. 04/25/19  Yes [provider]  gabapentin (NEURONTIN) 600 MG  tablet Take 600 mg by mouth 3 (three) times daily. 04/02/20  Yes [provider]  hydrochlorothiazide (HYDRODIURIL) 25 MG tablet Take 25 mg by mouth daily.   Yes [provider]  HYDROcodone-acetaminophen (NORCO/VICODIN) 5-325 MG tablet Take 1 tablet by mouth every 6 (six) hours as needed for severe pain. 04/04/20  Yes Lavonia Drafts, MD  losartan (COZAAR) 50 MG tablet Take 50 mg by mouth daily.   Yes [provider]  methocarbamol (ROBAXIN) 500 MG tablet Take 500 mg by mouth in the morning, at noon, in the evening, and at bedtime. 03/31/20  Yes [provider]  Grant Ruts INHUB 250-50 MCG/DOSE AEPB Inhale 1 puff into the lungs 2 (two) times daily. 11/05/19  Yes [provider]  albuterol (VENTOLIN HFA) 108 (90 Base) MCG/ACT inhaler Inhale 2 puffs into the lungs every 6 (six) hours as needed for wheezing or shortness of breath. 05/21/19   Laban Emperor, PA-C  elvitegravir-cobicistat-emtricitabine-tenofovir (GENVOYA) 150-150-200-10 MG TABS tablet Take 1 tablet by mouth at bedtime.    [provider]  famotidine (PEPCID) 20 MG tablet Take 20 mg by mouth daily.  05/08/19   [provider]  traMADol (ULTRAM) 50 MG tablet Take 50 mg by mouth every 6 (six) hours as needed. 04/03/20   [provider]  valACYclovir (VALTREX) 1000 MG tablet Take 1,000 mg by mouth at bedtime.  04/25/19   [provider]     Family History  Problem Relation Age of Onset   Breast cancer Sister     Social History   Socioeconomic History   Marital status: Single    Spouse name: Not on file   Number of children: Not on file   Years of education: Not on file   Highest education level: Not on file  Occupational History   Not on file  Tobacco Use   Smoking status: Current Some Day Smoker    Packs/day: 0.25    Years: 38.00    Pack years: 9.50    Types: Cigarettes   Smokeless tobacco: Never Used   Tobacco comment: 1 cig daily  Vaping Use    Vaping Use: Never used  Substance and Sexual Activity   Alcohol use: Yes    Alcohol/week: 5.0 standard drinks    Types: 5 Glasses of wine per week    Comment: occ   Drug use: Not Currently   Sexual activity: Not on file  Other Topics Concern   Not on file  Social History Narrative   Not on file   Social Determinants of Health   Financial Resource Strain:    Difficulty of Paying Living Expenses: Not on file  Food Insecurity:    Worried About [place] in the Last Year: Not on file   Ran Out of Food in the Last Year: Not on file  Transportation Needs:    Lack of Transportation (Medical): Not on file   Lack of Transportation (Non-Medical): Not on file  Physical Activity:    Days of Exercise per Week: Not  on file   Minutes of Exercise per Session: Not on file  Stress:    Feeling of Stress : Not on file  Social Connections:    Frequency of Communication with Friends and Family: Not on file   Frequency of Social Gatherings with Friends and Family: Not on file   Attends Religious Services: Not on file   Active Member of Clubs or Organizations: Not on file   Attends Archivist Meetings: Not on file   Marital Status: Not on file    Review of Systems: A 12 point ROS discussed and pertinent positives are indicated in the HPI above.  All other systems are negative.  Vital Signs: BP (!) 123/55    Pulse 89    Temp (!) 97.5 F (36.4 C) (Oral)    Resp 16    Ht 5\' 6"  (1.676 m)    Wt 124.3 kg    SpO2 95%    BMI 44.24 kg/m   Physical Exam Constitutional:      Appearance: She is obese. She is not ill-appearing.  HENT:     Head: Normocephalic.     Mouth/Throat:     Mouth: Mucous membranes are moist.  Cardiovascular:     Heart sounds: Normal heart sounds.  Pulmonary:     Effort: Pulmonary effort is normal.     Breath sounds: Normal breath sounds.  Abdominal:     General: There is no distension.     Palpations: Abdomen is soft.     Comments:  Left lower abdominal drain in place   Skin:    General: Skin is warm and dry.     Imaging: CT ABDOMEN PELVIS WO CONTRAST  Result Date: 05/12/2020 CLINICAL DATA:  Intra-abdominal abscess, fever EXAM: CT ABDOMEN AND PELVIS WITHOUT CONTRAST TECHNIQUE: Multidetector CT imaging of the abdomen and pelvis was performed following the standard protocol without IV contrast. COMPARISON:  None. FINDINGS: Lower chest: The visualized lung bases are clear bilaterally. Cardiac blood pool is of relatively low attenuation in keeping with at least moderate anemia. Global cardiac size within normal limits. Hepatobiliary: Hyperdensity of the gallbladder contents likely represents vicarious excretion of contrast. The gallbladder and liver are otherwise unremarkable. No intra or extrahepatic biliary ductal dilation. Pancreas: Unremarkable Spleen: Unremarkable Adrenals/Urinary Tract: Unremarkable Stomach/Bowel: There is extraluminal enteric contrast identified adjacent to the lateral segment of the left hepatic lobe which has increased in volume since prior examination. The exact source of leakage, however, is not clearly identified on this examination. The small bowel is diffusely mildly dilated containing both gas and enteric contrast in keeping with changes of a mild ileus. Oral contrast, however, does appear to extend into the terminal ileum. Adjacent to the terminal ileum is a loculated collection of extraluminal gas measuring 6.7 x 2.5 cm. The pigtail drainage catheter is just within this collection, unchanged from prior examination. The volume of gas is increased since prior examination, possibly the result of a flushing of the drainage catheter or a a continued enteric communication. There is extensive descending and sigmoid colonic diverticulosis without superimposed inflammatory change. Large bowel otherwise unremarkable. Appendix normal. There is mild ascites present, similar in volume to prior examination. Diffuse  body wall edema has improved in the interval since prior examination. Vascular/Lymphatic: The abdominal vasculature is age-appropriate with moderate aortoiliac atherosclerotic calcification. No aneurysm. No pathologic adenopathy within the abdomen and pelvis. Reproductive: Uterus and bilateral adnexa are unremarkable. Other: Rectum unremarkable. Musculoskeletal: Lumbar fusion with instrumentation has been performed. No lytic  or blastic bone lesions. IMPRESSION: 1. Extraluminal enteric contrast adjacent to the lateral segment of the left hepatic lobe which has increased in volume since prior examination. The exact source of leakage, however, is not clearly identified on this examination. 2. Loculated collection of extraluminal gas adjacent to the terminal ileum. The pigtail drainage catheter is just within this collection, unchanged from prior examination. The volume of gas is increased since prior examination, possibly the result of a flushing of the drainage catheter or a continued enteric communication. 3. Mild ileus. 4. Improved body wall edema. Aortic Atherosclerosis (ICD10-I70.0). Electronically Signed   By: Fidela Salisbury MD   On: 05/12/2020 21:00   CT ABDOMEN PELVIS WO CONTRAST  Result Date: 04/29/2020 CLINICAL DATA:  Left lower quadrant abdominal pain. History of diverticulitis complicated by abscess. EXAM: CT ABDOMEN AND PELVIS WITHOUT CONTRAST TECHNIQUE: Multidetector CT imaging of the abdomen and pelvis was performed following the standard protocol without IV contrast. COMPARISON:  CT abdomen pelvis dated 04/18/2020. FINDINGS: Lower chest: There is a moderate right pleural effusion with associated atelectasis. There is mild left basilar atelectasis. Hepatobiliary: No focal liver abnormality is seen. No gallstones, gallbladder wall thickening, or biliary dilatation. Pancreas: Unremarkable. No pancreatic ductal dilatation or surrounding inflammatory changes. Spleen: Normal in size without focal  abnormality. Adrenals/Urinary Tract: Adrenal glands are unremarkable. Kidneys are normal, without renal calculi, focal lesion, or hydronephrosis. Bladder is unremarkable. Stomach/Bowel: Stomach is within normal limits. Enteric contrast reaches the rectum. Multiple mildly dilated loops of small bowel are seen in the mid abdomen without a transition point to decompressed distal bowel, similar to prior exam. Appendix appears normal. There is sigmoid diverticulosis without significant associated inflammatory changes. Vascular/Lymphatic: Aortic atherosclerosis. No enlarged abdominal or pelvic lymph nodes. Reproductive: Uterus and bilateral adnexa are unremarkable. Other: There is a moderate volume intraperitoneal fluid, increased since 04/18/2020. No definite well-defined fluid collection to suggest abscess is identified, however evaluation is limited due to the lack of intravenous contrast. A locule of gas in the lower midline abdomen (series 2, image 67) is favored to be located within a loop of bowel. A pigtail catheter is seen in the lower abdomen, slightly retracted since prior exam. Anasarca is noted. No abdominal wall hernia. Musculoskeletal: Spinal fusion hardware is seen from L3-L5. Degenerative changes are seen in the spine. IMPRESSION: 1. Moderate volume intraperitoneal fluid, increased since 04/18/2020. No definite well-defined fluid collection to suggest abscess is identified, however evaluation is limited due to the lack of intravenous contrast. A pigtail catheter has been partially retracted but remains located in the lower abdomen. 2. Multiple mildly dilated loops of small bowel in the mid abdomen are similar to prior exam and likely reflect ileus. 3. Moderate right pleural effusion with associated atelectasis. Aortic Atherosclerosis (ICD10-I70.0). Electronically Signed   By: Zerita Boers M.D.   On: 04/29/2020 19:47   CT ABDOMEN PELVIS WO CONTRAST  Result Date: 04/18/2020 CLINICAL DATA:  59 year old  female with concern for abdominal abscess or infection. EXAM: CT ABDOMEN AND PELVIS WITHOUT CONTRAST TECHNIQUE: Multidetector CT imaging of the abdomen and pelvis was performed following the standard protocol without IV contrast. COMPARISON:  CT abdomen pelvis dated 04/14/2020. FINDINGS: Evaluation of this exam is limited due to respiratory motion artifact. Lower chest: Partially visualized clusters of ground-glass opacity primarily involving the right lung base may represent areas of atelectasis but concerning for pneumonia. Clinical correlation is recommended. No intra-abdominal free air. There is a small free fluid within the pelvis. Hepatobiliary: The liver is unremarkable.  No intrahepatic biliary ductal dilatation. High attenuating content within the gallbladder, likely vicarious excretion of contrast. Pancreas: Unremarkable. No pancreatic ductal dilatation or surrounding inflammatory changes. Spleen: Normal in size without focal abnormality. Adrenals/Urinary Tract: The adrenal glands unremarkable. Retained contrast from prior CT within the renal parenchyma consistent with delayed clearance and acute renal insufficiency. There is striated renal parenchyma concerning for pyelonephritis. Correlation with urinalysis recommended. There is no hydronephrosis. The visualized ureters appear unremarkable. The urinary bladder is decompressed around a Foley catheter. Stomach/Bowel: There is distal colonic and sigmoid diverticulosis without active inflammatory changes. Several mildly dilated small bowel loops measuring up to 3.4 cm in caliber without a transition most likely an ileus. Oral contrast traverses into the cecum. The appendix is normal. Vascular/Lymphatic: Advanced aortoiliac atherosclerotic disease. The IVC is unremarkable. No portal venous gas. There is no adenopathy. Reproductive: The uterus is grossly unremarkable. Other: Diffuse edema of the pelvic floor with small free fluid. Overall decrease in the  inflammatory changes and stranding compared to the prior CT. A pigtail drainage catheter is noted within the pelvis in similar position. No drainable fluid collection identified. Musculoskeletal: There is osteopenia with degenerative changes of the spine. L3-L5 disc spacer and posterior fusion. There is disc desiccation and vacuum phenomena at L5-S1. No acute osseous pathology. IMPRESSION: 1. Partially visualized right lung base atelectasis or pneumonia. Clinical correlation is recommended. 2. Retained contrast within the renal parenchyma concerning for acute renal insufficiency. There is heterogeneous and striated nephrogram which may represent pyelonephritis. Correlation with urinalysis recommended. 3. Mild ileus.  No definite obstruction. 4. Colonic diverticulosis. 5. Overall decrease in the inflammatory changes and stranding compared to the prior CT. A pigtail drainage catheter within the pelvis is similar position. No drainable fluid collection identified. 6. Aortic Atherosclerosis (ICD10-I70.0). Electronically Signed   By: Anner Crete M.D.   On: 04/18/2020 17:59   CT HEAD WO CONTRAST  Result Date: 05/02/2020 CLINICAL DATA:  Delirium. Increased lethargy and confusion following dialysis. EXAM: CT HEAD WITHOUT CONTRAST TECHNIQUE: Contiguous axial images were obtained from the base of the skull through the vertex without intravenous contrast. COMPARISON:  06/02/2019 FINDINGS: Brain: There is no evidence of an acute infarct, intracranial hemorrhage, mass, midline shift, or extra-axial fluid collection. Mild chronic small vessel ischemic changes are again noted including a chronic lacunar infarct in the left caudate head. A lacunar infarct in the right caudate head is new but also chronic in appearance. The ventricles and sulci are normal. Vascular: Calcified atherosclerosis at the skull base. No hyperdense vessel. Skull: No fracture or suspicious osseous lesion. Sinuses/Orbits: Mild mucosal thickening in  the paranasal sinuses with partially visualized small volume fluid in the right maxillary sinus. Clear mastoid air cells. Unremarkable orbits. Other: None. IMPRESSION: 1. No evidence of acute intracranial abnormality. 2. Mild chronic small vessel ischemia with chronic bilateral basal ganglia lacunar infarcts. Electronically Signed   By: Logan Bores M.D.   On: 05/02/2020 17:11   US RENAL  Result Date: 04/16/2020 CLINICAL DATA:  Acute renal failure. EXAM: RENAL / URINARY TRACT ULTRASOUND COMPLETE COMPARISON:  CT abdomen and pelvis 04/14/2020 FINDINGS: Right Kidney: Renal measurements: 11.5 x 5.3 x 4.9 cm = volume: 158 mL. Echogenicity within normal limits. No mass or hydronephrosis visualized. Left Kidney: Renal measurements: 11.3 x 5.5 x 6.1 cm = volume: 197 mL. Echogenicity within normal limits. No mass or hydronephrosis visualized. Bladder: Decompressed by Foley catheter. Other: None. IMPRESSION: Negative renal ultrasound. Electronically Signed   By: Seymour Bars.D.  On: 04/16/2020 13:01   PERIPHERAL VASCULAR CATHETERIZATION  Result Date: 04/28/2020 See op note  US Venous Img Upper Uni Right(DVT)  Result Date: 04/23/2020 CLINICAL DATA:  Right upper extremity pain and swelling for 1 week EXAM: RIGHT UPPER EXTREMITY VENOUS DOPPLER ULTRASOUND TECHNIQUE: Gray-scale sonography with graded compression, as well as color Doppler and duplex ultrasound were performed to evaluate the upper extremity deep venous system from the level of the subclavian vein and including the jugular, axillary, basilic, radial, ulnar and upper cephalic vein. Spectral Doppler was utilized to evaluate flow at rest and with distal augmentation maneuvers. COMPARISON:  None. FINDINGS: Contralateral Subclavian Vein: Respiratory phasicity is normal and symmetric with the symptomatic side. No evidence of thrombus. Normal compressibility. Internal Jugular Vein: No evidence of thrombus. Normal compressibility, respiratory phasicity and  response to augmentation. Subclavian Vein: No evidence of thrombus. Normal compressibility, respiratory phasicity and response to augmentation. Axillary Vein: No evidence of thrombus. Normal compressibility, respiratory phasicity and response to augmentation. Cephalic Vein: No evidence of thrombus. Normal compressibility, respiratory phasicity and response to augmentation. Basilic Vein: No evidence of thrombus. Normal compressibility, respiratory phasicity and response to augmentation. Brachial Veins: No evidence of thrombus. Normal compressibility, respiratory phasicity and response to augmentation. Radial Veins: No evidence of thrombus. Normal compressibility, respiratory phasicity and response to augmentation. Ulnar Veins: No evidence of thrombus. Normal compressibility, respiratory phasicity and response to augmentation. IMPRESSION: No evidence of DVT within the right upper extremity. Electronically Signed   By: Jerilynn Mages.  Shick M.D.   On: 04/23/2020 07:43   DG Chest Port 1 View  Result Date: 05/05/2020 CLINICAL DATA:  Fever. EXAM: PORTABLE CHEST 1 VIEW COMPARISON:  April 28, 2020. FINDINGS: The heart size and mediastinal contours are within normal limits. No pneumothorax is noted. Right internal jugular dialysis catheter is unchanged in position. Hypoinflation of the lungs is noted with mild bibasilar subsegmental atelectasis. Small pleural effusions may be present. Left perihilar opacity is noted concerning for atelectasis or possibly infiltrate. The visualized skeletal structures are unremarkable. IMPRESSION: Hypoinflation of the lungs is noted with mild bibasilar subsegmental atelectasis. Left perihilar opacity is noted concerning for atelectasis or possibly infiltrate. Electronically Signed   By: Marijo Conception M.D.   On: 05/05/2020 08:28   DG Chest Port 1 View  Result Date: 04/28/2020 CLINICAL DATA:  Sepsis. EXAM: PORTABLE CHEST 1 VIEW COMPARISON:  April 14, 2020. FINDINGS: The heart size and  mediastinal contours are within normal limits. Both lungs are clear. No pneumothorax or pleural effusion is noted. Interval placement of right internal jugular catheter with distal tip in expected position of the SVC. The visualized skeletal structures are unremarkable. IMPRESSION: No active disease. Electronically Signed   By: Marijo Conception M.D.   On: 04/28/2020 13:08    Labs:  CBC: Recent Labs    05/12/20 0744 05/13/20 0728 05/14/20 0604 05/15/20 0444  WBC 5.4 14.3* 19.9* 28.8*  HGB 8.0* 7.3* 7.4* 7.0*  HCT 25.1* 22.6* 22.4* 22.6*  PLT 450* 390 381 338    COAGS: Recent Labs    11/06/19 0432 04/14/20 1245 04/16/20 0348 05/15/20 0443  INR 1.1 1.1 2.1* 1.2  APTT 26 28  --  35    BMP: Recent Labs    05/12/20 0744 05/13/20 0728 05/14/20 0604 05/15/20 0443  NA 140 138 138 137  K 3.8 3.5 3.8 3.0*  CL 101 101 101 100  CO2 27 26 25 27   GLUCOSE 72 86 82 96  BUN 52* 33* 39* 26*  CALCIUM 7.5* 7.3* 7.4* 7.4*  CREATININE 3.66* 2.69* 2.91* 2.32*  GFRNONAA 13* 19* 17* 22*  GFRAA 15* 22* 20* 26*    LIVER FUNCTION TESTS: Recent Labs    04/25/20 0613 04/25/20 9485 05/01/20 0705 05/02/20 0945 05/03/20 0636 05/04/20 0543 05/05/20 0831 05/06/20 0701 05/07/20 1547 05/14/20 0604  BILITOT 1.2  --  1.4*  --  1.4*  --   --   --   --  1.3*  AST 36  --  28  --  27  --   --   --   --  25  ALT 44  --  17  --  18  --   --   --   --  23  ALKPHOS 100  --  97  --  101  --   --   --   --  78  PROT 4.9*  --  5.3*  --  5.8*  --   --   --   --  5.3*  ALBUMIN 1.9*   < > 1.4*   < > 1.5*   < > 1.3* 1.3* 1.2* 1.1*   < > = values in this interval not displayed.    TUMOR MARKERS: No results for input(s): AFPTM, CEA, CA199, CHROMGRNA in the last 8760 hours.  Assessment and Plan:  59 year old female with history of complicated diverticulitis with worsening leukocytosis in the setting of malpositioned indwelling abdominal drain and new intraabdominal fluid collections.  Plan for  re-wiring and upsizing of malpositioned indwelling 12 Fr drain.  If drain unable to be re-wired, plan for new puncture and drainage of largest lower abdominal collection.    Also plan for aspiration and possible drain placement of largest left abdominal fluid collection.  Hopeful that these fluid collections are in communication and two drains will help to mostly resolve.   Thank you for this interesting consult.  I greatly enjoyed meeting IZADORA ROEHR and look forward to participating in their care.  A copy of this report was sent to the requesting provider on this date.  Electronically Signed: Suzette Battiest, MD 05/15/2020, 3:16 PM   I spent a total of 20 Minutes  in face to face in clinical consultation, greater than 50% of which was counseling/coordinating care for management of intraabdominal fluid collections related to complicated diverticulitis.

## 2020-05-16 DIAGNOSIS — R6521 Severe sepsis with septic shock: Secondary | ICD-10-CM | POA: Diagnosis not present

## 2020-05-16 DIAGNOSIS — G9341 Metabolic encephalopathy: Secondary | ICD-10-CM | POA: Diagnosis not present

## 2020-05-16 DIAGNOSIS — A419 Sepsis, unspecified organism: Secondary | ICD-10-CM | POA: Diagnosis not present

## 2020-05-16 DIAGNOSIS — K5720 Diverticulitis of large intestine with perforation and abscess without bleeding: Secondary | ICD-10-CM | POA: Diagnosis not present

## 2020-05-16 LAB — COMPREHENSIVE METABOLIC PANEL
ALT: 24 U/L (ref 0–44)
AST: 25 U/L (ref 15–41)
Albumin: 1.1 g/dL — ABNORMAL LOW (ref 3.5–5.0)
Alkaline Phosphatase: 81 U/L (ref 38–126)
Anion gap: 11 (ref 5–15)
BUN: 30 mg/dL — ABNORMAL HIGH (ref 6–20)
CO2: 26 mmol/L (ref 22–32)
Calcium: 7.4 mg/dL — ABNORMAL LOW (ref 8.9–10.3)
Chloride: 103 mmol/L (ref 98–111)
Creatinine, Ser: 2.46 mg/dL — ABNORMAL HIGH (ref 0.44–1.00)
GFR calc Af Amer: 24 mL/min — ABNORMAL LOW (ref >60)
GFR calc non Af Amer: 21 mL/min — ABNORMAL LOW (ref >60)
Glucose, Bld: 82 mg/dL (ref 70–99)
Potassium: 3 mmol/L — ABNORMAL LOW (ref 3.5–5.1)
Sodium: 140 mmol/L (ref 135–145)
Total Bilirubin: 1 mg/dL (ref 0.3–1.2)
Total Protein: 5.3 g/dL — ABNORMAL LOW (ref 6.5–8.1)

## 2020-05-16 LAB — CBC WITH DIFFERENTIAL/PLATELET
Abs Immature Granulocytes: 5.32 10*3/uL — ABNORMAL HIGH (ref 0.00–0.07)
Basophils Absolute: 0.2 10*3/uL — ABNORMAL HIGH (ref 0.0–0.1)
Basophils Relative: 1 %
Eosinophils Absolute: 0 10*3/uL (ref 0.0–0.5)
Eosinophils Relative: 0 %
HCT: 24.6 % — ABNORMAL LOW (ref 36.0–46.0)
Hemoglobin: 8 g/dL — ABNORMAL LOW (ref 12.0–15.0)
Immature Granulocytes: 18 %
Lymphocytes Relative: 6 %
Lymphs Abs: 1.6 10*3/uL (ref 0.7–4.0)
MCH: 29.7 pg (ref 26.0–34.0)
MCHC: 32.5 g/dL (ref 30.0–36.0)
MCV: 91.4 fL (ref 80.0–100.0)
Monocytes Absolute: 3 10*3/uL — ABNORMAL HIGH (ref 0.1–1.0)
Monocytes Relative: 10 %
Neutro Abs: 19.3 10*3/uL — ABNORMAL HIGH (ref 1.7–7.7)
Neutrophils Relative %: 65 %
Platelets: 283 10*3/uL (ref 150–400)
RBC: 2.69 MIL/uL — ABNORMAL LOW (ref 3.87–5.11)
RDW: 17.9 % — ABNORMAL HIGH (ref 11.5–15.5)
Smear Review: NORMAL
WBC: 29.5 10*3/uL — ABNORMAL HIGH (ref 4.0–10.5)
nRBC: 0.8 % — ABNORMAL HIGH (ref 0.0–0.2)

## 2020-05-16 LAB — TYPE AND SCREEN
ABO/RH(D): O POS
Antibody Screen: NEGATIVE
Unit division: 0

## 2020-05-16 LAB — BPAM RBC
Blood Product Expiration Date: 202110122359
ISSUE DATE / TIME: 202109162155
Unit Type and Rh: 5100

## 2020-05-16 LAB — MAGNESIUM: Magnesium: 1.9 mg/dL (ref 1.7–2.4)

## 2020-05-16 LAB — PHOSPHORUS: Phosphorus: 3.4 mg/dL (ref 2.5–4.6)

## 2020-05-16 MED ORDER — POTASSIUM CHLORIDE CRYS ER 20 MEQ PO TBCR
40.0000 meq | EXTENDED_RELEASE_TABLET | Freq: Once | ORAL | Status: AC
  Administered 2020-05-16: 40 meq via ORAL
  Filled 2020-05-16: qty 2

## 2020-05-16 MED ORDER — HEPARIN SODIUM (PORCINE) 5000 UNIT/ML IJ SOLN
5000.0000 [IU] | Freq: Three times a day (TID) | INTRAMUSCULAR | Status: DC
  Administered 2020-05-16 – 2020-05-23 (×20): 5000 [IU] via SUBCUTANEOUS
  Filled 2020-05-16 (×19): qty 1

## 2020-05-16 NOTE — Progress Notes (Addendum)
Progress Note    [name]  ZOX:[id number] DOB: 03/02/1961  DOA: 04/14/2020 PCP: Theotis Burrow, MD      Brief Narrative:    Medical records reviewed and are as summarized below:  Melanie Bowen is a 59 y.o. female with medical history significant for hypertension, diverticulosis, enteric infection, morbid obesity, GERD, COPD, history of stroke, asthma, depression, seizures enzymes with acute onset of abdominal pain nausea vomiting and constipation.   She was admitted to the hospital for septic shock secondary to acute diverticulitis with diverticular abscess.  She was treated with IV fluids and empiric IV antimicrobial therapy.  She required vasopressors for septic shock.  She was seen by interventional radiologist and CT-guided drainage was done by IR on 04/14/2020.  Fluid culture showed Pseudomonas aeruginosa, Klebsiella pneumonia and E. coli.  Septic shock was complicated by acute kidney injury, acute liver failure, acute metabolic encephalopathy and acute hypoxemic respiratory failure.  She was seen by the nephrologist for AKI.  Patient was started on hemodialysis for AKI. She developed atrial fibrillation with RVR.  She was seen in consultation by the cardiologist as well      Assessment/Plan:   Principal Problem:   Severe sepsis with septic shock ([place]) Active Problems:   HTN (hypertension)   HIV (human immunodeficiency virus infection) ([place])   Diverticulitis of large intestine with abscess without bleeding   Abscess of sigmoid colon due to diverticulitis   Acute renal failure (ARF) ([place])   Hypotension   Diverticulosis   Acute respiratory distress   DNR (do not resuscitate) discussion   Palliative care by specialist   Dyspnea   Goals of care, counseling/discussion   Pressure injury of skin   Nutrition Problem: Increased nutrient needs Etiology: chronic illness (COPD, new HD)  Signs/Symptoms: estimated needs   Body mass index is 44.24 kg/m.   (Morbid obesity)  Septic shock - resolved  Acute sigmoid diverticulitis and diverticular abscess, significant leukocytosis- s/p CT-guided drainage by IR on 04/14/2020.  Fluid culture showed Klebsiella pneumonia, pseudomonas aeruginosa and E. Coli.  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  No acute abnormality on CT head on 05/02/2020  History of stroke    PLAN  S/p successful upsizing and repositioning of indwelling lower abdominal drain from 12 [place] to 14 [place] and 05/15/2020.  There is concern for suspected perforation of diverticulum.  Continue IV meropenem and anidulafungin (of note, patient had IV meropenem from 04/15/2020 to 05/06/2020 and meropenem was restarted on 05/09/2020.  She also had a anidulafungin from 04/15/2020 to 04/22/2020 and this was restarted on 05/02/2020)  S/p transfusion with 1 unit of PRBCs on 05/01/2020, 05/04/2020, 05/09/2020 and 05/15/2020. Monitor H&H closely.  Replete potassium and monitor levels.  Continue amiodarone for A. fib Continue antiretroviral therapy for HIV infection Continue bronchodilators for COPD  Follow-up with nephrologist for hemodialysis Follow-up with ID, general surgery and interventional radiologist.  Overall prognosis is poor.  Case was discussed with ID specialist, Dr. Steva Ready.   Diet Order            DIET DYS 3 Room service appropriate? Yes; Fluid consistency: Thin  Diet effective now  Consultants:  General surgeon  Cardiologist  Nephrologist  Vascular surgeon  Interventional  radiologist  Procedures:  CT-guided drainage of diverticular abscess on 04/14/2020    Medications:   . sodium chloride   Intravenous Once  . acidophilus  1 capsule Oral Daily  . alteplase  2 mg Intracatheter Once  . alteplase  2 mg Intracatheter Once  . amiodarone  200 mg Oral BID  . Chlorhexidine Gluconate Cloth  6 each Topical Daily  . rilpivirine  25 mg Oral Q breakfast   And  . dolutegravir  50 mg Oral Q breakfast  . dronabinol  5 mg Oral BID AC  . epoetin (EPOGEN/PROCRIT) injection  10,000 Units Intravenous Q M,W,F-HD  . famotidine  10 mg Oral Q1200  . feeding supplement (ENSURE ENLIVE)  237 mL Oral TID BM  . guaiFENesin-dextromethorphan  10 mL Oral Q8H  . heparin injection (subcutaneous)  5,000 Units Subcutaneous Q8H  . magnesium oxide  400 mg Oral Daily  . mometasone-formoterol  2 puff Inhalation BID  . multivitamin  1 tablet Oral QHS  . multivitamin with minerals  1 tablet Oral Daily  . sodium chloride flush  5 mL Intracatheter Q8H  . sodium chloride flush  5 mL Intracatheter Q8H  . torsemide  40 mg Oral Daily   Continuous Infusions: . sodium chloride 250 mL (05/03/20 0043)  . sodium chloride Stopped (05/12/20 0620)  . anidulafungin 78 mL/hr at 05/16/20 0435  . meropenem (MERREM) IV Stopped (05/16/20 0327)     Anti-infectives (From admission, onward)   Start     Dose/Rate Route Frequency Ordered Stop   05/10/20 2200  meropenem (MERREM) 500 mg in sodium chloride 0.9 % 100 mL IVPB  Status:  Discontinued        500 mg 200 mL/hr over 30 Minutes Intravenous Every 12 hours 05/10/20 1324 05/10/20 1328   05/10/20 2200  meropenem (MERREM) 500 mg in sodium chloride 0.9 % 100 mL IVPB        500 mg 200 mL/hr over 30 Minutes Intravenous Daily 05/10/20 1328     05/09/20 2200  meropenem (MERREM) 1 g in sodium chloride 0.9 % 100 mL IVPB  Status:  Discontinued        1 g 200 mL/hr over 30 Minutes Intravenous Every 12 hours 05/09/20 2121 05/10/20 1324   05/07/20 1300   rilpivirine (EDURANT) tablet 25 mg       "And" Linked Group Details   25 mg Oral Daily with breakfast 05/07/20 1245     05/07/20 1300  dolutegravir (TIVICAY) tablet 50 mg       "And" Linked Group Details   50 mg Oral Daily with breakfast 05/07/20 1245     05/06/20 0956  meropenem (MERREM) 500 mg in sodium chloride 0.9 % 100 mL IVPB  Status:  Discontinued        500 mg 200 mL/hr over 30 Minutes Intravenous Every 24 hours 05/05/20 1018 05/05/20 1637   05/03/20 2000  anidulafungin (ERAXIS) 100 mg in sodium chloride 0.9 % 100 mL IVPB        100 mg 78 mL/hr over 100 Minutes Intravenous Every 24 hours 05/02/20 2223     05/02/20 2330  anidulafungin (ERAXIS) 200 mg in sodium chloride 0.9 % 200 mL IVPB        200 mg 78 mL/hr over 200 Minutes Intravenous  Once 05/02/20 2223 05/03/20 0455   04/30/20 0800  rilpivirine (EDURANT) tablet 25 mg  Status:  Discontinued       "  And" Linked Group Details   25 mg Oral Daily with breakfast 04/29/20 1254 05/02/20 2223   04/30/20 0800  dolutegravir (TIVICAY) tablet 50 mg  Status:  Discontinued       "And" Linked Group Details   50 mg Oral Daily with breakfast 04/29/20 1254 05/02/20 2223   04/28/20 2200  meropenem (MERREM) 500 mg in sodium chloride 0.9 % 100 mL IVPB  Status:  Discontinued        500 mg 200 mL/hr over 30 Minutes Intravenous Every 12 hours 04/28/20 1546 05/05/20 1018   04/26/20 2200  meropenem (MERREM) 500 mg in sodium chloride 0.9 % 100 mL IVPB  Status:  Discontinued        500 mg 200 mL/hr over 30 Minutes Intravenous Every 8 hours 04/26/20 1258 04/28/20 1546   04/25/20 1700  rilpivirine (EDURANT) tablet 25 mg  Status:  Discontinued        25 mg Oral Daily with breakfast 04/24/20 1636 04/25/20 1111   04/25/20 1200  rilpivirine (EDURANT) tablet 25 mg  Status:  Discontinued        25 mg Oral Daily with breakfast 04/25/20 1111 04/29/20 1254   04/25/20 1000  meropenem (MERREM) 500 mg in sodium chloride 0.9 % 100 mL IVPB  Status:  Discontinued         500 mg 200 mL/hr over 30 Minutes Intravenous Every 12 hours 04/25/20 0935 04/26/20 1258   04/24/20 1700  darunavir-cobicistat (PREZCOBIX) 800-150 MG per tablet 1 tablet  Status:  Discontinued        1 tablet Oral Daily with breakfast 04/24/20 1231 04/24/20 1636   04/24/20 1330  dolutegravir (TIVICAY) tablet 50 mg  Status:  Discontinued        50 mg Oral Daily 04/24/20 1231 04/29/20 1254   04/23/20 2200  meropenem (MERREM) 500 mg in sodium chloride 0.9 % 100 mL IVPB  Status:  Discontinued        500 mg 200 mL/hr over 30 Minutes Intravenous Every 24 hours 04/23/20 1127 04/25/20 0935   04/18/20 2000  levofloxacin (LEVAQUIN) IVPB 750 mg        750 mg 100 mL/hr over 90 Minutes Intravenous  Once 04/18/20 1857 04/19/20 0004   04/18/20 1800  levofloxacin (LEVAQUIN) IVPB 750 mg  Status:  Discontinued        750 mg 100 mL/hr over 90 Minutes Intravenous  Once 04/18/20 1640 04/18/20 1857   04/16/20 2200  anidulafungin (ERAXIS) 100 mg in sodium chloride 0.9 % 100 mL IVPB        100 mg 78 mL/hr over 100 Minutes Intravenous Every 24 hours 04/16/20 1707 04/22/20 0211   04/15/20 2200  valACYclovir (VALTREX) tablet 500 mg  Status:  Discontinued        500 mg Oral Daily at bedtime 04/15/20 1248 04/16/20 1259   04/15/20 2200  anidulafungin (ERAXIS) 200 mg in sodium chloride 0.9 % 200 mL IVPB        200 mg 78 mL/hr over 200 Minutes Intravenous  Once 04/15/20 2008 04/16/20 0327   04/15/20 2200  meropenem (MERREM) 1 g in sodium chloride 0.9 % 100 mL IVPB  Status:  Discontinued        1 g 200 mL/hr over 30 Minutes Intravenous Every 12 hours 04/15/20 2017 04/23/20 1127   04/15/20 1400  piperacillin-tazobactam (ZOSYN) IVPB 3.375 g  Status:  Discontinued        3.375 g 12.5 mL/hr over 240 Minutes Intravenous Every 8 hours 04/15/20  1300 04/15/20 2006   04/14/20 2200  elvitegravir-cobicistat-emtricitabine-tenofovir (GENVOYA) 150-150-200-10 MG tablet 1 tablet  Status:  Discontinued        1 tablet Oral Daily at  bedtime 04/14/20 0235 04/15/20 0957   04/14/20 2200  valACYclovir (VALTREX) tablet 1,000 mg  Status:  Discontinued        1,000 mg Oral Daily at bedtime 04/14/20 0235 04/15/20 1248   04/14/20 1230  cefTRIAXone (ROCEPHIN) 2 g in sodium chloride 0.9 % 100 mL IVPB  Status:  Discontinued        2 g 200 mL/hr over 30 Minutes Intravenous Every 24 hours 04/14/20 1223 04/15/20 1300   04/14/20 1230  metroNIDAZOLE (FLAGYL) IVPB 500 mg  Status:  Discontinued        500 mg 100 mL/hr over 60 Minutes Intravenous Every 8 hours 04/14/20 1223 04/15/20 1636   04/14/20 1030  piperacillin-tazobactam (ZOSYN) IVPB 3.375 g  Status:  Discontinued        3.375 g 12.5 mL/hr over 240 Minutes Intravenous Every 8 hours 04/14/20 0235 04/14/20 1223   04/14/20 0215  piperacillin-tazobactam (ZOSYN) IVPB 4.5 g  Status:  Discontinued        4.5 g 200 mL/hr over 30 Minutes Intravenous  Once 04/14/20 0200 04/14/20 0208   04/14/20 0215  piperacillin-tazobactam (ZOSYN) IVPB 3.375 g        3.375 g 100 mL/hr over 30 Minutes Intravenous  Once 04/14/20 0208 04/14/20 0301             Family Communication/Anticipated D/C date and plan/Code Status   DVT prophylaxis:      Code Status: Partial Code  Family Communication:  Disposition Plan:    Status is: Inpatient  Remains inpatient appropriate because:IV treatments appropriate due to intensity of illness or inability to take PO and Inpatient level of care appropriate due to severity of illness   Dispo: The patient is from: Home              Anticipated d/c is to: SNF              Anticipated d/c date is: > 3 days              Patient currently is not medically stable to d/c.           Subjective:   She still complains of abdominal pain.  No shortness of breath or chest pain.  Objective:    Vitals:   05/16/20 0244 05/16/20 1000 05/16/20 1409 05/16/20 1410  BP: 110/64  (!) 118/59   Pulse: 92  (!) 111 (!) 105  Resp: (!) 24  17   Temp: (!) 97.5 F  (36.4 C) 98.2 F (36.8 C) 99 F (37.2 C)   TempSrc: Oral  Oral   SpO2: 92%  99% 99%  Weight:      Height:       No data found.   Intake/Output Summary (Last 24 hours) at 05/16/2020 1559 Last data filed at 05/16/2020 0639 Gross per 24 hour  Intake 492.39 ml  Output 30 ml  Net 462.39 ml   Filed Weights   05/03/20 0547 05/09/20 0951 05/14/20 1349  Weight: 130.9 kg 119 kg 124.3 kg    Exam:   GEN: No acute distress SKIN: Raw area/denuded skin on right buttock.  Denuded areas on the left buttock are healing. EYES: No pallor or icterus ENT: Moist mucous membranes CV: Regular rate and rhythm PULM: No wheezing or rales heard ABD: Soft, obese,  tenderness in the left quadrant, no rebound tenderness or guarding,  + JP drain with brownish fluid CNS: Alert and oriented to person.  No focal deficits. EXT: Trace leg edema is improving.  No tenderness    Data Reviewed:   I have personally reviewed following labs and imaging studies:  Labs: Labs show the following:   Basic Metabolic Panel: Recent Labs  Lab 05/10/20 0605 05/10/20 0605 05/11/20 0708 05/11/20 0708 05/12/20 0744 05/12/20 0744 05/13/20 0728 05/13/20 0728 05/14/20 0604 05/14/20 0604 05/15/20 0443 05/16/20 0440  NA 141   < > 141   < > 140  --  138  --  138  --  137 140  K 3.7   < > 3.7   < > 3.8   < > 3.5   < > 3.8   < > 3.0* 3.0*  CL 102   < > 102   < > 101  --  101  --  101  --  100 103  CO2 27   < > 28   < > 27  --  26  --  25  --  27 26  GLUCOSE 77   < > 70   < > 72  --  86  --  82  --  96 82  BUN 41*   < > 45*   < > 52*  --  33*  --  39*  --  26* 30*  CREATININE 2.96*   < > 3.45*   < > 3.66*  --  2.69*  --  2.91*  --  2.32* 2.46*  CALCIUM 7.6*   < > 7.5*   < > 7.5*  --  7.3*  --  7.4*  --  7.4* 7.4*  MG 2.1  --  2.0  --  2.0  --   --   --   --   --   --  1.9   < > = values in this interval not displayed.   GFR Estimated Creatinine Clearance: 33.6 mL/min (A) (by C-G formula based on SCr of 2.46 mg/dL  (H)). Liver Function Tests: Recent Labs  Lab 05/14/20 0604 05/16/20 0440  AST 25 25  ALT 23 24  ALKPHOS 78 81  BILITOT 1.3* 1.0  PROT 5.3* 5.3*  ALBUMIN 1.1* 1.1*   No results for input(s): LIPASE, AMYLASE in the last 168 hours. No results for input(s): AMMONIA in the last 168 hours. Coagulation profile Recent Labs  Lab 05/15/20 0443  INR 1.2    CBC: Recent Labs  Lab 05/12/20 0744 05/13/20 0728 05/14/20 0604 05/15/20 0444 05/16/20 0440  WBC 5.4 14.3* 19.9* 28.8* 29.5*  NEUTROABS  --   --   --   --  19.3*  HGB 8.0* 7.3* 7.4* 7.0* 8.0*  HCT 25.1* 22.6* 22.4* 22.6* 24.6*  MCV 91.3 90.8 91.4 93.4 91.4  PLT 450* 390 381 338 283   Cardiac Enzymes: No results for input(s): CKTOTAL, CKMB, CKMBINDEX, TROPONINI in the last 168 hours. BNP (last 3 results) No results for input(s): PROBNP in the last 8760 hours. CBG: No results for input(s): GLUCAP in the last 168 hours. D-Dimer: No results for input(s): DDIMER in the last 72 hours. Hgb A1c: No results for input(s): HGBA1C in the last 72 hours. Lipid Profile: No results for input(s): CHOL, HDL, LDLCALC, TRIG, CHOLHDL, LDLDIRECT in the last 72 hours. Thyroid function studies: No results for input(s): TSH, T4TOTAL, T3FREE, THYROIDAB in the last 72 hours.  Invalid  input(s): FREET3 Anemia work up: No results for input(s): VITAMINB12, FOLATE, FERRITIN, TIBC, IRON, RETICCTPCT in the last 72 hours. Sepsis Labs: Recent Labs  Lab 05/13/20 0728 05/14/20 0604 05/15/20 0444 05/16/20 0440  WBC 14.3* 19.9* 28.8* 29.5*    Microbiology Recent Results (from the past 240 hour(s))  Aerobic/Anaerobic Culture (surgical/deep wound)     Status: None (Preliminary result)   Collection Time: 05/15/20  4:36 PM   Specimen: Abdomen; Abdominal Fluid  Result Value Ref Range Status   Specimen Description   Final    ABDOMEN Performed at [facility], [address], [place], [place] [address]    Special Requests   Final     ABDOMINAL FLUID Performed at [facility], [address] [place], [place] [address]    Gram Stain   Final    RARE WBC PRESENT, PREDOMINANTLY MONONUCLEAR NO ORGANISMS SEEN    Culture   Final    NO GROWTH < 24 HOURS Performed at [facility], [place] [address], [place], [place] [address]    Report Status PENDING  Incomplete    Procedures and diagnostic studies:  CT IMAGE GUIDED DRAINAGE BY PERCUTANEOUS CATHETER  Result Date: 05/16/2020 INDICATION: 59 year old female with history of diverticulitis complicated by intra-abdominal abscess with prior lower abdominal drain placement, now malpositioned on recent CT abdomen pelvis. Additionally there are new intra-abdominal fluid collections in the upper abdomen, largest in the left mid abdomen. EXAM: CT GUIDED aspiration OF abdominal fluid collection CT-guided exchange of indwelling abscess drain MEDICATIONS: The patient is currently admitted to the hospital and receiving intravenous antibiotics. The antibiotics were administered within an appropriate time frame prior to the initiation of the procedure. ANESTHESIA/SEDATION: 0 mg IV Versed 50 mcg IV Fentanyl Moderate Sedation Time:  0 The patient was continuously monitored during the procedure by the interventional radiology nurse under my direct supervision. COMPLICATIONS: None immediate. TECHNIQUE: Informed written consent was obtained from the patient after a thorough discussion of the procedural risks, benefits and alternatives. All questions were addressed. Maximal Sterile Barrier Technique was utilized including caps, mask, sterile gowns, sterile gloves, sterile drape, hand hygiene and skin antiseptic. A timeout was performed prior to the initiation of the procedure. PROCEDURE: The left upper quadrant and indwelling lower abdominal drain were prepped with Chlorhexidine in a sterile fashion, and a sterile drape was applied covering the operative fields. A sterile gown and sterile gloves  were used for the procedure. Local anesthesia was provided with 1% Lidocaine. 13 gauge trocar needle was directed into the left abdominal fluid collection from an anterior approach. Aspiration was performed. A sample was collected and sent to lab for culture. Next, the indwelling 12 [place] lower abdominal fluid collection drain was exchanged over an Amplatz wire for a 14 [place] drain. The pigtail 4 shin was locked in the deep part of the fluid collection. The drain was sutured in place and a dressing was applied. FINDINGS: Left abdominal fluid collection yielded approximately 250 mL of serous fluid. The fluid collection was essentially absent after aspiration. Given the non purulent nature of this fluid, no drain was placed. The indwelling lower abdominal drain appears in better position than comparison CT, with the pigtail portion in the fluid collection and the radiopaque marker deep to the rectus she. There was succus draining from around the catheter entry site. Successful exchange with 14 French drain, now position deeper within the fluid collection. IMPRESSION: 1. Left upper abdominal fluid collection yielded serous fluid, therefore no drain was  placed. Recommend follow-up fluid culture. 2. Successful upsize and repositioning of the indwelling lower abdominal drain from 12 [place] to 14 [place]. Given feculent output around the drain and within the drain bulb upon completion of the procedure, it is suspected that the draining fluid collection represents perforation of a diverticulum in the setting of diverticulitis. Electronically Signed   By: Ruthann Cancer MD   On: 05/16/2020 07:49               LOS: 62 days   [facility]   Pager on [email or web address]. If 7PM-7AM, please contact night-coverage at [email or web address]     05/16/2020, 3:59 PM

## 2020-05-16 NOTE — Progress Notes (Signed)
[facility] SURGICAL PROGRESS NOTE (cpt [phone])  Hospital Day(s): 32.   Post op day(s): 21 Days Post-Op.   Interval History:  Patient seen and examined No acute events or new complaints overnight.  Patient reports she is doing well, she does have a burning aching pain at her drain site but otherwise she denied fever, chills, nausea, emesis, SOB Leukocytosis slightly worse again this morning to 29.5K, no fevers Renal function remains consisted with degree of renal failure, sCr - 2.46 Hypokalemia to 3.0 Abdominal drain upsized to 54fr yesterday (09/16); now with 50 ccs out; still appears like feculence but is much thinner this morning Serous fluid aspirated from intra-abdominal cavity, no growth on Cx She has been tolerating PO and having bowel function Therapies on board; recommending SNF vs LTACH  Review of Systems:  Constitutional: denies fever, chills  HEENT: denies cough or congestion  Respiratory: denies any shortness of breath  Cardiovascular: denies chest pain or palpitations  Gastrointestinal: denies abdominal pain, N/V, or diarrhea/and bowel function as per interval history Genitourinary: denies burning with urination or urinary frequency Musculoskeletal: denies pain, decreased motor or sensation  Vital signs in last 24 hours: [min-max] current  Temp:  [97.5 F (36.4 C)-98.3 F (36.8 C)] 97.5 F (36.4 C) (09/17 0244) Pulse Rate:  [78-99] 92 (09/17 0244) Resp:  [13-29] 24 (09/17 0244) BP: (90-123)/(42-64) 110/64 (09/17 0244) SpO2:  [92 %-100 %] 92 % (09/17 0244)     Height: 5\' 6"  (167.6 cm) Weight: 124.3 kg BMI (Calculated): 44.26   Intake/Output last 2 shifts:  09/16 0701 - 09/17 0700 In: 492.4 [Blood:343; IV Piggyback:149.4] Out: 30 [Drains:30]   Physical Exam:  Constitutional: alert, cooperative and no distress HENT: normocephalic without obvious abnormality  Eyes: PERRL, EOM's grossly intact and symmetric  Respiratory: breathing non-labored at  rest  Cardiovascular: regular rate and sinus rhythm  Gastrointestinal: soft,tenderness over suprapubic drain site, I am unable to illicit any additional tenderness, and non-distended, no rebound/guarding, certainly without evidence of peritonitis, JP in LLQ output still appears somewhat feculent but is thinner this morning  Musculoskeletal: no edema or wounds, motor and sensation grossly intact, NT    Labs:  CBC Latest Ref Rng & Units 05/16/2020 05/15/2020 05/14/2020  WBC 4.0 - 10.5 K/uL 29.5(H) 28.8(H) 19.9(H)  Hemoglobin 12.0 - 15.0 g/dL 8.0(L) 7.0(L) 7.4(L)  Hematocrit 36 - 46 % 24.6(L) 22.6(L) 22.4(L)  Platelets 150 - 400 K/uL 283 338 381   CMP Latest Ref Rng & Units 05/16/2020 05/15/2020 05/14/2020  Glucose 70 - 99 mg/dL 82 96 82  BUN 6 - 20 mg/dL 30(H) 26(H) 39(H)  Creatinine 0.44 - 1.00 mg/dL 2.46(H) 2.32(H) 2.91(H)  Sodium 135 - 145 mmol/L 140 137 138  Potassium 3.5 - 5.1 mmol/L 3.0(L) 3.0(L) 3.8  Chloride 98 - 111 mmol/L 103 100 101  CO2 22 - 32 mmol/L 26 27 25   Calcium 8.9 - 10.3 mg/dL 7.4(L) 7.4(L) 7.4(L)  Total Protein 6.5 - 8.1 g/dL 5.3(L) - 5.3(L)  Total Bilirubin 0.3 - 1.2 mg/dL 1.0 - 1.3(H)  Alkaline Phos 38 - 126 U/L 81 - 78  AST 15 - 41 U/L 25 - 25  ALT 0 - 44 U/L 24 - 23    Imaging studies: No new pertinent imaging studies   Assessment/Plan: (ICD-10's: K64.20) 59 y.o. female with sigmoid diverticulitis with abscess s/p percutaneous drainage on 08/16andshe continues to bewithout any evidence of peritonitis/pneumoperitoneum/pneumatosis on examination or imaginghowever her drain output has now become feculent concerning for colonic fistula however  we appear to have better control of output,complicated by pertinent comorbidities includingmarked COPD   - Her leukocytosis remains markedly elevated however she remains without hemodynamic instability nor peritonitis on examination. Again, I do think controlling this fistula/output/abscess with drainage is in her best  interest. She is at high risk for complications from surgical intervention and may requiring prolonged intubation post-operatively giving her respiratory comorbidities and this goes against her advanced directives/wishes. We will of course continue to closely follow and reassess should her clinical condition change. If her leukocytosis fails to resolve, we can consider laparoscopic washout and drain placement sometime early next week, again pending her clinical condition   - Continue IV Abx(meropenem); ID followingand appreciate assistance; Cx withpseudomonas, kleb [email or web address] - Monitor abdominal examination; leukocytosis - Pain control prn; antiemetics prn - Appreciate nephrology assistance and recommendations - Continue to work with therapies; suspect she will need significant rehab - Further management per primary service; we will continue to follow  All of the above findings and recommendations were discussed with the patient, and the medical team, and all of patient's questions were answered to her expressed satisfaction.  -- Edison Simon, PA-C [facility] 05/16/2020, 7:14 AM [phone] M-F: 7am - 4pm

## 2020-05-16 NOTE — Progress Notes (Addendum)
ID Pt more alert Patient Vitals for the past 24 hrs:  BP Temp Temp src Pulse Resp SpO2  05/16/20 1410 -- -- -- (!) 105 -- 99 %  05/16/20 1409 (!) 118/59 99 F (37.2 C) Oral (!) 111 17 99 %  05/16/20 1000 -- 98.2 F (36.8 C) -- -- -- --  05/16/20 0244 110/64 (!) 97.5 F (36.4 C) Oral 92 (!) 24 92 %  05/16/20 0106 118/61 97.7 F (36.5 C) Oral 95 -- 98 %  05/15/20 2218 (!) 115/58 (!) 97.5 F (36.4 C) Oral 91 20 96 %  05/15/20 2146 104/60 97.7 F (36.5 C) Oral 99 20 97 %    Chest b/l air entry Hs s1s2  abd soft- drain left lower quadrant Dark brown fluid  CBC Latest Ref Rng & Units 05/16/2020 05/15/2020 05/14/2020  WBC 4.0 - 10.5 K/uL 29.5(H) 28.8(H) 19.9(H)  Hemoglobin 12.0 - 15.0 g/dL 8.0(L) 7.0(L) 7.4(L)  Hematocrit 36 - 46 % 24.6(L) 22.6(L) 22.4(L)  Platelets 150 - 400 K/uL 283 338 381    CMP Latest Ref Rng & Units 05/16/2020 05/15/2020 05/14/2020  Glucose 70 - 99 mg/dL 82 96 82  BUN 6 - 20 mg/dL 30(H) 26(H) 39(H)  Creatinine 0.44 - 1.00 mg/dL 2.46(H) 2.32(H) 2.91(H)  Sodium 135 - 145 mmol/L 140 137 138  Potassium 3.5 - 5.1 mmol/L 3.0(L) 3.0(L) 3.8  Chloride 98 - 111 mmol/L 103 100 101  CO2 22 - 32 mmol/L 26 27 25   Calcium 8.9 - 10.3 mg/dL 7.4(L) 7.4(L) 7.4(L)  Total Protein 6.5 - 8.1 g/dL 5.3(L) - 5.3(L)  Total Bilirubin 0.3 - 1.2 mg/dL 1.0 - 1.3(H)  Alkaline Phos 38 - 126 U/L 81 - 78  AST 15 - 41 U/L 25 - 25  ALT 0 - 44 U/L 24 - 23  ' Impression/recommendation Septic shocksecondary to perforated diverticulitisand peridiverticular abscess. pseudomonas, kleb andESBLe.coli in the culture. JP drain inserted on 04/14/20.  meropenem 04/15/20>>9/7/21and restarted 05/09/20 for fever anidulafungin 8/17>>8/24and restarted on 9/3/21for fever and leucocytosis- The latter is worsening- CT abdomen done on 9/13 shows pockets of collection and Loculated collection of extraluminal gas adjacent to the terminal Ileum. This is poor source control . Antibiotics do not work  effectively and will only lead to MDRO and cdiff Drain upsized yesterday Send culture to look for MDRO   Encephalopathy-metabolic -fluctuating-  Hypoxic resp failureon presentation-needed intubation for a few days s/p extubation Rigorousincentive spirometry Chest PT because of secretions and atelectasis   AKI due to septic shock/contrast nephropathy-on  dialysis  Anasarca- better  HIV- well controlled in jan 2021 her Vl <20 and cd4 >1000.  On 05/03/20 Vl is < 20 [id number] ( 50%)On dolutegravir and rilpivirine - started taking oral meds  Afib on amiodarone Discussed the management with care team ID will follow her peripherally this weekend- call if needed

## 2020-05-16 NOTE — Progress Notes (Signed)
[facility]  ROUNDING NOTE   Subjective:   Patient seen and evaluated the bedside. Underwent dialysis treatment today. Remains confused at times.  Objective:  Vital signs in last 24 hours:  Temp:  [97.5 F (36.4 C)-99 F (37.2 C)] 99 F (37.2 C) (09/17 1409) Pulse Rate:  [78-111] 105 (09/17 1410) Resp:  [13-29] 17 (09/17 1409) BP: (90-121)/(42-64) 118/59 (09/17 1409) SpO2:  [92 %-100 %] 99 % (09/17 1410)  Weight change:  Filed Weights   05/03/20 0547 05/09/20 0951 05/14/20 1349  Weight: 130.9 kg 119 kg 124.3 kg    Intake/Output: I/O last 3 completed shifts: In: 911.5 [I.V.:5; Blood:343; IV Piggyback:563.5] Out: 48 [Drains:48]   Intake/Output this shift:  No intake/output data recorded.  Physical Exam: General: No acute distress  Lungs:  Basilar rales, normal effort  Heart: Irregular  Abdomen:  Non tender, non distended  Extremities: No peripheral edema   Neurologic: Awake, alert, confused  Skin No rash  Access: RIJ permcath 8/27 Dr. [name]    Basic Metabolic Panel: Recent Labs  Lab 05/10/20 9371 05/10/20 6967 05/11/20 0708 05/11/20 8938 05/12/20 1017 05/12/20 5102 05/13/20 5852 05/13/20 7782 05/14/20 0604 05/15/20 0443 05/16/20 0440  NA 141   < > 141   < > 140  --  138  --  138 137 140  K 3.7   < > 3.7   < > 3.8  --  3.5  --  3.8 3.0* 3.0*  CL 102   < > 102   < > 101  --  101  --  101 100 103  CO2 27   < > 28   < > 27  --  26  --  25 27 26   GLUCOSE 77   < > 70   < > 72  --  86  --  82 96 82  BUN 41*   < > 45*   < > 52*  --  33*  --  39* 26* 30*  CREATININE 2.96*   < > 3.45*   < > 3.66*  --  2.69*  --  2.91* 2.32* 2.46*  CALCIUM 7.6*   < > 7.5*   < > 7.5*   < > 7.3*   < > 7.4* 7.4* 7.4*  MG 2.1  --  2.0  --  2.0  --   --   --   --   --  1.9   < > = values in this interval not displayed.    Liver Function Tests: Recent Labs  Lab 05/14/20 0604 05/16/20 0440  AST 25 25  ALT 23 24  ALKPHOS 78 81  BILITOT 1.3* 1.0  PROT 5.3* 5.3*   ALBUMIN 1.1* 1.1*   No results for input(s): LIPASE, AMYLASE in the last 168 hours. No results for input(s): AMMONIA in the last 168 hours.  CBC: Recent Labs  Lab 05/12/20 0744 05/13/20 0728 05/14/20 0604 05/15/20 0444 05/16/20 0440  WBC 5.4 14.3* 19.9* 28.8* 29.5*  NEUTROABS  --   --   --   --  19.3*  HGB 8.0* 7.3* 7.4* 7.0* 8.0*  HCT 25.1* 22.6* 22.4* 22.6* 24.6*  MCV 91.3 90.8 91.4 93.4 91.4  PLT 450* 390 381 338 283    Cardiac Enzymes: No results for input(s): CKTOTAL, CKMB, CKMBINDEX, TROPONINI in the last 168 hours.  BNP: Invalid input(s): POCBNP  CBG: No results for input(s): GLUCAP in the last 168 hours.  Microbiology: Results for orders placed or performed  during the hospital encounter of 04/14/20  SARS Coronavirus 2 by RT PCR (hospital order, performed in [facility] hospital lab) Nasopharyngeal Nasopharyngeal Swab     Status: None   Collection Time: 04/14/20  2:17 AM   Specimen: Nasopharyngeal Swab  Result Value Ref Range Status   SARS Coronavirus 2 NEGATIVE NEGATIVE Final    Comment: (NOTE) SARS-CoV-2 target nucleic acids are NOT DETECTED.  The SARS-CoV-2 RNA is generally detectable in upper and lower respiratory specimens during the acute phase of infection. The lowest concentration of SARS-CoV-2 viral copies this assay can detect is 250 copies / mL. A negative result does not preclude SARS-CoV-2 infection and should not be used as the sole basis for treatment or other patient management decisions.  A negative result may occur with improper specimen collection / handling, submission of specimen other than nasopharyngeal swab, presence of viral mutation(s) within the areas targeted by this assay, and inadequate number of viral copies (<250 copies / mL). A negative result must be combined with clinical observations, patient history, and epidemiological information.  Fact Sheet for Patients:   StrictlyIdeas.no  Fact Sheet for  Healthcare Providers: BankingDealers.co.za  This test is not yet approved or  cleared by the [place] FDA and has been authorized for detection and/or diagnosis of SARS-CoV-2 by FDA under an Emergency Use Authorization (EUA).  This EUA will remain in effect (meaning this test can be used) for the duration of the COVID-19 declaration under Section 564(b)(1) of the Act, 21 U.S.C. section 360bbb-3(b)(1), unless the authorization is terminated or revoked sooner.  Performed at [facility], [address] [place], [place] [address]   CULTURE, BLOOD (ROUTINE X 2) w Reflex to ID Panel     Status: None   Collection Time: 04/14/20  8:14 AM   Specimen: BLOOD  Result Value Ref Range Status   Specimen Description BLOOD LEFT [facility]  Final   Special Requests   Final    BOTTLES DRAWN AEROBIC AND ANAEROBIC Blood Culture adequate volume   Culture   Final    NO GROWTH 5 DAYS Performed at [facility], [address] [place], [place] [address]    Report Status 04/19/2020 FINAL  Final  CULTURE, BLOOD (ROUTINE X 2) w Reflex to ID Panel     Status: None   Collection Time: 04/14/20  8:14 AM   Specimen: BLOOD  Result Value Ref Range Status   Specimen Description BLOOD LEFT WRIST  Final   Special Requests   Final    BOTTLES DRAWN AEROBIC AND ANAEROBIC Blood Culture adequate volume   Culture   Final    NO GROWTH 5 DAYS Performed at [facility], [address], [place], [place] [address]    Report Status 04/19/2020 FINAL  Final  Aerobic/Anaerobic Culture (surgical/deep wound)     Status: None   Collection Time: 04/14/20 11:14 AM   Specimen: Abscess  Result Value Ref Range Status   Specimen Description   Final    ABSCESS Performed at [facility], [address] [place], [place] [address]    Special Requests   Final    NONE Performed at [facility], [place], [place] [address]    Gram Stain   Final     NO WBC SEEN ABUNDANT GRAM NEGATIVE RODS FEW GRAM POSITIVE COCCI FEW GRAM POSITIVE RODS Performed at [facility], [place] [address] [place],  [address]    Culture   Final  ABUNDANT KLEBSIELLA PNEUMONIAE ABUNDANT PSEUDOMONAS AERUGINOSA MODERATE ESCHERICHIA COLI Confirmed Extended Spectrum Beta-Lactamase Producer (ESBL).  In bloodstream infections from ESBL organisms, carbapenems are preferred over piperacillin/tazobactam. They are shown to have a lower risk of mortality. MIXED ANAEROBIC FLORA PRESENT.  CALL LAB IF FURTHER IID REQUIRED.    Report Status 04/20/2020 FINAL  Final   Organism ID, Bacteria KLEBSIELLA PNEUMONIAE  Final   Organism ID, Bacteria PSEUDOMONAS AERUGINOSA  Final   Organism ID, Bacteria ESCHERICHIA COLI  Final      Susceptibility   Escherichia coli - MIC*    AMPICILLIN >=32 RESISTANT Resistant     CEFAZOLIN >=64 RESISTANT Resistant     CEFEPIME 16 RESISTANT Resistant     CEFTAZIDIME RESISTANT Resistant     CEFTRIAXONE >=64 RESISTANT Resistant     CIPROFLOXACIN >=4 RESISTANT Resistant     GENTAMICIN <=1 SENSITIVE Sensitive     IMIPENEM <=0.25 SENSITIVE Sensitive     TRIMETH/SULFA >=320 RESISTANT Resistant     AMPICILLIN/SULBACTAM >=32 RESISTANT Resistant     PIP/TAZO 8 SENSITIVE Sensitive     * MODERATE ESCHERICHIA COLI   Klebsiella pneumoniae - MIC*    AMPICILLIN >=32 RESISTANT Resistant     CEFAZOLIN <=4 SENSITIVE Sensitive     CEFEPIME <=0.12 SENSITIVE Sensitive     CEFTAZIDIME <=1 SENSITIVE Sensitive     CEFTRIAXONE <=0.25 SENSITIVE Sensitive     CIPROFLOXACIN <=0.25 SENSITIVE Sensitive     GENTAMICIN <=1 SENSITIVE Sensitive     IMIPENEM <=0.25 SENSITIVE Sensitive     TRIMETH/SULFA <=20 SENSITIVE Sensitive     AMPICILLIN/SULBACTAM 16 INTERMEDIATE Intermediate     PIP/TAZO 8 SENSITIVE Sensitive     * ABUNDANT KLEBSIELLA PNEUMONIAE   Pseudomonas aeruginosa - MIC*    CEFTAZIDIME 4 SENSITIVE Sensitive     CIPROFLOXACIN <=0.25 SENSITIVE  Sensitive     GENTAMICIN 2 SENSITIVE Sensitive     IMIPENEM 2 SENSITIVE Sensitive     PIP/TAZO 8 SENSITIVE Sensitive     CEFEPIME 4 SENSITIVE Sensitive     * ABUNDANT PSEUDOMONAS AERUGINOSA  MRSA PCR Screening     Status: None   Collection Time: 04/14/20  6:19 PM   Specimen: Nasopharyngeal  Result Value Ref Range Status   MRSA by PCR NEGATIVE NEGATIVE Final    Comment:        The GeneXpert MRSA Assay (FDA approved for NASAL specimens only), is one component of a comprehensive MRSA colonization surveillance program. It is not intended to diagnose MRSA infection nor to guide or monitor treatment for MRSA infections. Performed at [facility], [address], [place], [place] [address]   Urine Culture     Status: None   Collection Time: 04/15/20  4:28 AM   Specimen: Urine, Random  Result Value Ref Range Status   Specimen Description   Final    URINE, RANDOM Performed at [facility], [address], [place], [place] [address]    Special Requests   Final    NONE Performed at [facility], [address] [place], [place] [address]    Culture   Final    NO GROWTH Performed at [facility], [place] [address], [place], [place] [address]    Report Status 04/16/2020 FINAL  Final  CULTURE, BLOOD (ROUTINE X 2) w Reflex to ID Panel     Status: None   Collection Time: 04/22/20  6:12 PM   Specimen: BLOOD  Result Value Ref Range Status   Specimen Description BLOOD  FOOT  Final   Special Requests   Final    BOTTLES DRAWN AEROBIC AND ANAEROBIC Blood Culture adequate volume   Culture   Final    NO GROWTH 5 DAYS Performed at [facility], [address] [place], [place] [address]    Report Status 04/27/2020 FINAL  Final  CULTURE, BLOOD (ROUTINE X 2) w Reflex to ID Panel     Status: None   Collection Time: 04/22/20  6:17 PM   Specimen: BLOOD  Result Value Ref Range Status   Specimen Description BLOOD FOOT  Final   Special Requests    Final    BOTTLES DRAWN AEROBIC AND ANAEROBIC Blood Culture results may not be optimal due to an excessive volume of blood received in culture bottles   Culture   Final    NO GROWTH 5 DAYS Performed at [facility], [address] [place], [place] [address]    Report Status 04/27/2020 FINAL  Final  CULTURE, BLOOD (ROUTINE X 2) w Reflex to ID Panel     Status: None   Collection Time: 05/01/20  2:50 PM   Specimen: BLOOD  Result Value Ref Range Status   Specimen Description BLOOD BLOOD LEFT HAND  Final   Special Requests   Final    BOTTLES DRAWN AEROBIC AND ANAEROBIC Blood Culture adequate volume   Culture   Final    NO GROWTH 5 DAYS Performed at [facility], [address] [place], [place] [address]    Report Status 05/06/2020 FINAL  Final  CULTURE, BLOOD (ROUTINE X 2) w Reflex to ID Panel     Status: None   Collection Time: 05/01/20  2:51 PM   Specimen: BLOOD  Result Value Ref Range Status   Specimen Description BLOOD BLOOD RIGHT HAND  Final   Special Requests   Final    BOTTLES DRAWN AEROBIC AND ANAEROBIC Blood Culture adequate volume   Culture   Final    NO GROWTH 5 DAYS Performed at [facility], [address], [place], [place] [address]    Report Status 05/06/2020 FINAL  Final  Aerobic/Anaerobic Culture (surgical/deep wound)     Status: None (Preliminary result)   Collection Time: 05/15/20  4:36 PM   Specimen: Abdomen; Abdominal Fluid  Result Value Ref Range Status   Specimen Description   Final    ABDOMEN Performed at [facility], [address], [place], [place] [address]    Special Requests   Final    ABDOMINAL FLUID Performed at [facility], [address] [place], [place] [address]    Gram Stain   Final    RARE WBC PRESENT, PREDOMINANTLY MONONUCLEAR NO ORGANISMS SEEN    Culture   Final    NO GROWTH < 24 HOURS Performed at [facility], [place] [address], [place],  [address]    Report Status  PENDING  Incomplete    Coagulation Studies: Recent Labs    05/15/20 0443  LABPROT 14.7  INR 1.2    Urinalysis: No results for input(s): COLORURINE, LABSPEC, PHURINE, GLUCOSEU, HGBUR, BILIRUBINUR, KETONESUR, PROTEINUR, UROBILINOGEN, NITRITE, LEUKOCYTESUR in the last 72 hours.  Invalid input(s): APPERANCEUR    Imaging: CT IMAGE GUIDED DRAINAGE BY PERCUTANEOUS CATHETER  Result Date: 05/16/2020 INDICATION: 59 year old female with history of diverticulitis complicated by intra-abdominal abscess with prior lower abdominal drain placement, now malpositioned on recent CT abdomen pelvis. Additionally there are new intra-abdominal fluid collections in the upper abdomen, largest in the left  mid abdomen. EXAM: CT GUIDED aspiration OF abdominal fluid collection CT-guided exchange of indwelling abscess drain MEDICATIONS: The patient is currently admitted to the hospital and receiving intravenous antibiotics. The antibiotics were administered within an appropriate time frame prior to the initiation of the procedure. ANESTHESIA/SEDATION: 0 mg IV Versed 50 mcg IV Fentanyl Moderate Sedation Time:  0 The patient was continuously monitored during the procedure by the interventional radiology nurse under my direct supervision. COMPLICATIONS: None immediate. TECHNIQUE: Informed written consent was obtained from the patient after a thorough discussion of the procedural risks, benefits and alternatives. All questions were addressed. Maximal Sterile Barrier Technique was utilized including caps, mask, sterile gowns, sterile gloves, sterile drape, hand hygiene and skin antiseptic. A timeout was performed prior to the initiation of the procedure. PROCEDURE: The left upper quadrant and indwelling lower abdominal drain were prepped with Chlorhexidine in a sterile fashion, and a sterile drape was applied covering the operative fields. A sterile gown and sterile gloves were used for the procedure. Local anesthesia was provided  with 1% Lidocaine. 35 gauge trocar needle was directed into the left abdominal fluid collection from an anterior approach. Aspiration was performed. A sample was collected and sent to lab for culture. Next, the indwelling 12 [place] lower abdominal fluid collection drain was exchanged over an Amplatz wire for a 14 [place] drain. The pigtail 4 shin was locked in the deep part of the fluid collection. The drain was sutured in place and a dressing was applied. FINDINGS: Left abdominal fluid collection yielded approximately 250 mL of serous fluid. The fluid collection was essentially absent after aspiration. Given the non purulent nature of this fluid, no drain was placed. The indwelling lower abdominal drain appears in better position than comparison CT, with the pigtail portion in the fluid collection and the radiopaque marker deep to the rectus she. There was succus draining from around the catheter entry site. Successful exchange with 14 French drain, now position deeper within the fluid collection. IMPRESSION: 1. Left upper abdominal fluid collection yielded serous fluid, therefore no drain was placed. Recommend follow-up fluid culture. 2. Successful upsize and repositioning of the indwelling lower abdominal drain from 12 [place] to 14 [place]. Given feculent output around the drain and within the drain bulb upon completion of the procedure, it is suspected that the draining fluid collection represents perforation of a diverticulum in the setting of diverticulitis. Electronically Signed   By: Ruthann Cancer MD   On: 05/16/2020 07:49     Medications:   . sodium chloride 250 mL (05/03/20 0043)  . sodium chloride Stopped (05/12/20 0620)  . anidulafungin 78 mL/hr at 05/16/20 0435  . meropenem (MERREM) IV Stopped (05/16/20 0327)   . sodium chloride   Intravenous Once  . acidophilus  1 capsule Oral Daily  . alteplase  2 mg Intracatheter Once  . alteplase  2 mg Intracatheter Once  . amiodarone  200 mg Oral BID   . Chlorhexidine Gluconate Cloth  6 each Topical Daily  . rilpivirine  25 mg Oral Q breakfast   And  . dolutegravir  50 mg Oral Q breakfast  . dronabinol  5 mg Oral BID AC  . epoetin (EPOGEN/PROCRIT) injection  10,000 Units Intravenous Q M,W,F-HD  . famotidine  10 mg Oral Q1200  . feeding supplement (ENSURE ENLIVE)  237 mL Oral TID BM  . guaiFENesin-dextromethorphan  10 mL Oral Q8H  . magnesium oxide  400 mg Oral Daily  . mometasone-formoterol  2 puff Inhalation BID  .  multivitamin  1 tablet Oral QHS  . multivitamin with minerals  1 tablet Oral Daily  . potassium chloride  40 mEq Oral Once  . sodium chloride flush  5 mL Intracatheter Q8H  . sodium chloride flush  5 mL Intracatheter Q8H  . torsemide  40 mg Oral Daily   sodium chloride, acetaminophen **OR** acetaminophen, albuterol, ALPRAZolam, bisacodyl, diphenhydrAMINE, docusate sodium, fentaNYL, heparin NICU/SCN flush, HYDROmorphone (DILAUDID) injection, iohexol, ipratropium-albuterol, magic mouthwash **AND** lidocaine, magnesium hydroxide, menthol-cetylpyridinium, metoprolol tartrate, oxyCODONE-acetaminophen, phenol, simethicone  Assessment/ Plan:  Melanie Bowen is a 59 y.o. black female with HIV, COPD, hypertension, GERD, who is admitted to [facility] on 04/14/2020 for Dyspnea [R06.00] Colonic diverticular abscess [K57.20] Diverticulitis of large intestine with abscess without bleeding [K57.20] Abscess of sigmoid colon due to diverticulitis [K57.20]  #Acute renal failure   -Maintain the patient on MWF dialysis schedule for now.  Remains dialysis dependent at present.  Lab Results  Component Value Date   CREATININE 2.46 (H) 05/16/2020   CREATININE 2.32 (H) 05/15/2020   CREATININE 2.91 (H) 05/14/2020   Outpatient chair at [facility] MWF schedule.   #. Anemia of chronic kidney disease:  Lab Results  Component Value Date   HGB 8.0 (L) 05/16/2020  Hemoglobin up to 8.0 post transfusion.  Continue to monitor.  Continue  Epogen 10,000 units IV with dialysis.  #Fever Receiving Anidulafungin (started on 05/03/20) meropenem IV (started on 05/10/20)   # Hypotension Blood pressure currently 118/59.  Has not required midodrine thus far.      LOS: 32 Clarann Helvey 9/17/20212:37 PM

## 2020-05-16 NOTE — Progress Notes (Addendum)
   05/16/20 1500  Clinical Encounter Type  Visited With Patient  Visit Type Follow-up  Referral From Chaplain  Consult/Referral To Chaplain  While rounding 2C, chaplain stopped by to check on pt. Although pt he appeared to be a little more alert than yesterday, chaplain still made her visit brief. Pt pointed asking chaplain to put her phone in the bed, under the cover. Pt thanked chaplain for coming by and chaplain left.

## 2020-05-17 LAB — BASIC METABOLIC PANEL
Anion gap: 11 (ref 5–15)
BUN: 20 mg/dL (ref 6–20)
CO2: 26 mmol/L (ref 22–32)
Calcium: 7.4 mg/dL — ABNORMAL LOW (ref 8.9–10.3)
Chloride: 101 mmol/L (ref 98–111)
Creatinine, Ser: 2.1 mg/dL — ABNORMAL HIGH (ref 0.44–1.00)
GFR calc Af Amer: 29 mL/min — ABNORMAL LOW (ref >60)
GFR calc non Af Amer: 25 mL/min — ABNORMAL LOW (ref >60)
Glucose, Bld: 69 mg/dL — ABNORMAL LOW (ref 70–99)
Potassium: 3.4 mmol/L — ABNORMAL LOW (ref 3.5–5.1)
Sodium: 138 mmol/L (ref 135–145)

## 2020-05-17 LAB — CBC WITH DIFFERENTIAL/PLATELET
Abs Immature Granulocytes: 4.96 10*3/uL — ABNORMAL HIGH (ref 0.00–0.07)
Basophils Absolute: 0.1 10*3/uL (ref 0.0–0.1)
Basophils Relative: 0 %
Eosinophils Absolute: 0.1 10*3/uL (ref 0.0–0.5)
Eosinophils Relative: 0 %
HCT: 23.2 % — ABNORMAL LOW (ref 36.0–46.0)
Hemoglobin: 7.3 g/dL — ABNORMAL LOW (ref 12.0–15.0)
Immature Granulocytes: 15 %
Lymphocytes Relative: 7 %
Lymphs Abs: 2.2 10*3/uL (ref 0.7–4.0)
MCH: 29 pg (ref 26.0–34.0)
MCHC: 31.5 g/dL (ref 30.0–36.0)
MCV: 92.1 fL (ref 80.0–100.0)
Monocytes Absolute: 2.8 10*3/uL — ABNORMAL HIGH (ref 0.1–1.0)
Monocytes Relative: 9 %
Neutro Abs: 22.3 10*3/uL — ABNORMAL HIGH (ref 1.7–7.7)
Neutrophils Relative %: 69 %
Platelets: 228 10*3/uL (ref 150–400)
RBC: 2.52 MIL/uL — ABNORMAL LOW (ref 3.87–5.11)
RDW: 18.3 % — ABNORMAL HIGH (ref 11.5–15.5)
WBC: 32.4 10*3/uL — ABNORMAL HIGH (ref 4.0–10.5)
nRBC: 0.9 % — ABNORMAL HIGH (ref 0.0–0.2)

## 2020-05-17 MED ORDER — POTASSIUM CHLORIDE CRYS ER 20 MEQ PO TBCR
40.0000 meq | EXTENDED_RELEASE_TABLET | Freq: Once | ORAL | Status: AC
  Administered 2020-05-17: 40 meq via ORAL
  Filled 2020-05-17: qty 2

## 2020-05-17 NOTE — Evaluation (Signed)
Physical Therapy Evaluation Patient Details Name: Melanie Bowen MRN: [id number] DOB: 1961/06/29 Today's Date: 05/17/2020   History of Present Illness   59 yo F who comes to [facility] on 8/16 c LLQ ABD pain. Pt admitted c Acute sigmoid diverticulitis with diverticular abscess. PMH: COPD, asthma, HTN, diverticulosis, HIV, GERD, depression, bilat TKA, 3 level lumbar fusion, susequent lumbar hardware removal. Pt moved to ICU early in stay, inititated on HD on 04/16/20, temp cath removed 8/25. Pt  has had AF RVR issues. Pt also reports a subacute Rt rotator cuff injury.  Had procedure on 05/06/20 for increasing drain tube size, to increase removal of abd drainage  Clinical Impression  Pt was seen for mobility on bed, but was unable to get her up to side of bed.  Pt is max of two just for rolling, yelling out and unable to offer assistance much of the time.  Has pain issues related to skin breakdown and generalized weakness, but is overall willing to try with PT.  Follow acutely to make as much progress with independent movement as possible before going to SNF.    Follow Up Recommendations SNF    Equipment Recommendations  None recommended by PT    Recommendations for Other Services       Precautions / Restrictions Precautions Precautions: Fall Precaution Comments: percutaneous drainage tube abd Restrictions Weight Bearing Restrictions: No Other Position/Activity Restrictions: abd drain      Mobility  Bed Mobility Overal bed mobility: Needs Assistance Bed Mobility: Rolling Rolling: Max assist;+2 for safety/equipment;+2 for physical assistance         General bed mobility comments: max of two to roll and could not get pt into a sitting posture  Transfers                 General transfer comment: unable to attempt  Ambulation/Gait                Stairs            Wheelchair Mobility    Modified Rankin (Stroke Patients Only)       Balance Overall balance  assessment: Needs assistance                                           Pertinent Vitals/Pain Pain Assessment: Faces Faces Pain Scale: Hurts even more Pain Location: BLEs/sacral region (wound)  Pain Descriptors / Indicators: Grimacing;Guarding;Burning Pain Intervention(s): Limited activity within patient's tolerance;Monitored during session;Repositioned    Home Living Family/patient expects to be discharged to:: Private residence Living Arrangements: Alone Available Help at Discharge: Family;Available 24 hours/day;Friend(s) Type of Home: House Home Access: Stairs to enter Entrance Stairs-Rails: Can reach both Entrance Stairs-Number of Steps: 3 Home Layout: One level Home Equipment: None Additional Comments: history obtained from chart    Prior Function Level of Independence: Needs assistance   Gait / Transfers Assistance Needed: walked with no assistance, stairs to enter house  ADL's / Homemaking Assistance Needed: I with all self care and care of home  Comments: had been working but some days family and friends helped care for her     Hand Dominance   Dominant Hand: Right    Extremity/Trunk Assessment   Upper Extremity Assessment Upper Extremity Assessment: Generalized weakness RUE Deficits / Details: unable to use for mobility    Lower Extremity Assessment Lower Extremity Assessment: Generalized weakness  Cervical / Trunk Assessment Cervical / Trunk Assessment: Normal  Communication   Communication: Expressive difficulties (some speech is unclear)  Cognition Arousal/Alertness: Lethargic Behavior During Therapy: Flat affect Overall Cognitive Status: No family/caregiver present to determine baseline cognitive functioning                                 General Comments: Pt is yelling and inappropriate at times, not clear if this was her baseline as she did live alone per chairt      General Comments General comments (skin  integrity, edema, etc.): pt is struggling with mobility and is unable to give a consistent effort to move.  She requires 2 max to safely move, and is a maxi move lift to get to chair currently.  Chair would be an issue with her sacral skin breakdown    Exercises     Assessment/Plan    PT Assessment Patient needs continued PT services  PT Problem List Decreased strength;Decreased range of motion;Decreased activity tolerance;Decreased balance;Decreased mobility;Decreased coordination;Decreased cognition;Decreased knowledge of use of DME;Decreased safety awareness;Cardiopulmonary status limiting activity;Obesity;Decreased skin integrity;Pain       PT Treatment Interventions DME instruction;Gait training;Stair training;Functional mobility training;Therapeutic activities;Therapeutic exercise;Balance training;Neuromuscular re-education;Patient/family education    PT Goals (Current goals can be found in the Care Plan section)  Acute Rehab PT Goals Patient Stated Goal: to get stronger and go home PT Goal Formulation: With patient Time For Goal Achievement: 05/31/20 Potential to Achieve Goals: Fair    Frequency Min 2X/week   Barriers to discharge Inaccessible home environment;Decreased caregiver support home with no help per her report    Co-evaluation               AM-PAC PT "6 Clicks" Mobility  Outcome Measure Help needed turning from your back to your side while in a flat bed without using bedrails?: A Lot Help needed moving from lying on your back to sitting on the side of a flat bed without using bedrails?: Total Help needed moving to and from a bed to a chair (including a wheelchair)?: Total Help needed standing up from a chair using your arms (e.g., wheelchair or bedside chair)?: Total Help needed to walk in hospital room?: Total Help needed climbing 3-5 steps with a railing? : Total 6 Click Score: 7    End of Session Equipment Utilized During Treatment: Oxygen Activity  Tolerance: Patient limited by fatigue;Patient limited by pain;Treatment limited secondary to medical complications (Comment) Patient left: in bed;with call bell/phone within reach;with bed alarm set;with nursing/sitter in room Nurse Communication: Mobility status PT Visit Diagnosis: Unsteadiness on feet (R26.81);Other abnormalities of gait and mobility (R26.89);Muscle weakness (generalized) (M62.81);Difficulty in walking, not elsewhere classified (R26.2)    Time: 0932-6712 PT Time Calculation (min) (ACUTE ONLY): 40 min   Charges:   PT Evaluation $PT Eval Moderate Complexity: 1 Mod PT Treatments $Therapeutic Activity: 23-37 mins       Ramond Dial 05/17/2020, 2:32 PM  Mee Hives, PT MS Acute Rehab Dept. Number: [place] [facility] [place]

## 2020-05-17 NOTE — Progress Notes (Signed)
Progress Note    Melanie Bowen  SNK:[id number] DOB: Aug 01, 1961  DOA: 04/14/2020 PCP: Theotis Burrow, MD      Brief Narrative:    Medical records reviewed and are as summarized below:  Melanie Bowen is a 59 y.o. female with medical history significant for hypertension, diverticulosis, enteric infection, morbid obesity, GERD, COPD, history of stroke, asthma, depression, seizures enzymes with acute onset of abdominal pain nausea vomiting and constipation.   She was admitted to the hospital for septic shock secondary to acute diverticulitis with diverticular abscess.  She was treated with IV fluids and empiric IV antimicrobial therapy.  She required vasopressors for septic shock.  She was seen by interventional radiologist and CT-guided drainage was done by IR on 04/14/2020.  Fluid culture showed Pseudomonas aeruginosa, Klebsiella pneumonia and E. coli.  Septic shock was complicated by acute kidney injury, acute liver failure, acute metabolic encephalopathy and acute hypoxemic respiratory failure.  She was seen by the nephrologist for AKI.  Patient was started on hemodialysis for AKI. She developed atrial fibrillation with RVR.  She was seen in consultation by the cardiologist as well      Assessment/Plan:   Principal Problem:   Severe sepsis with septic shock ([place]) Active Problems:   HTN (hypertension)   HIV (human immunodeficiency virus infection) ([place])   Diverticulitis of large intestine with abscess without bleeding   Abscess of sigmoid colon due to diverticulitis   Acute renal failure (ARF) ([place])   Hypotension   Diverticulosis   Acute respiratory distress   DNR (do not resuscitate) discussion   Palliative care by specialist   Dyspnea   Goals of care, counseling/discussion   Pressure injury of skin   Nutrition Problem: Increased nutrient needs Etiology: chronic illness (COPD, new HD)  Signs/Symptoms: estimated needs   Body mass index is 44.24 kg/m.   (Morbid obesity)  Septic shock - resolved  Acute sigmoid diverticulitis and diverticular abscess, significant leukocytosis- s/p CT-guided drainage by IR on 04/14/2020.  Fluid culture showed Klebsiella pneumonia, pseudomonas aeruginosa and E. Coli.  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  No acute abnormality on CT head on 05/02/2020  History of stroke    PLAN  S/p successful upsizing and repositioning of indwelling lower abdominal drain from 12 [place] to 14 [place] and 05/15/2020.  There is concern for suspected perforation of diverticulum.  Continue IV meropenem and anidulafungin (of note, patient had IV meropenem from 04/15/2020 to 05/06/2020 and meropenem was restarted on 05/09/2020.  She also had a anidulafungin from 04/15/2020 to 04/22/2020 and this was restarted on 05/02/2020)  S/p transfusion with 1 unit of PRBCs on 05/01/2020, 05/04/2020, 05/09/2020 and 05/15/2020. Monitor H&H closely.  Potassium level is improving.  Continue to monitor.  Continue amiodarone for A. fib Continue antiretroviral therapy for HIV infection Continue bronchodilators for COPD.  Follow-up with nephrologist for hemodialysis Follow-up with ID, general surgery and interventional radiologist.  Overall prognosis is poor.  Patient was advised to discuss her wishes with her family since her family still wants everything to be done at this point save for intubation with mechanical ventilation or BiPAP therapy.    Diet Order  DIET DYS 3 Room service appropriate? Yes; Fluid consistency: Thin  Diet effective now                     Consultants:  General surgeon  Cardiologist  Nephrologist  Vascular surgeon  Interventional radiologist  Procedures:  CT-guided drainage of diverticular abscess on 04/14/2020    Medications:   . sodium chloride   Intravenous Once  . acidophilus  1 capsule Oral Daily  . alteplase  2 mg Intracatheter Once  . alteplase  2 mg Intracatheter Once  . amiodarone  200 mg Oral BID  . Chlorhexidine Gluconate Cloth  6 each Topical Daily  . rilpivirine  25 mg Oral Q breakfast   And  . dolutegravir  50 mg Oral Q breakfast  . dronabinol  5 mg Oral BID AC  . epoetin (EPOGEN/PROCRIT) injection  10,000 Units Intravenous Q M,W,F-HD  . famotidine  10 mg Oral Q1200  . feeding supplement (ENSURE ENLIVE)  237 mL Oral TID BM  . guaiFENesin-dextromethorphan  10 mL Oral Q8H  . heparin injection (subcutaneous)  5,000 Units Subcutaneous Q8H  . magnesium oxide  400 mg Oral Daily  . mometasone-formoterol  2 puff Inhalation BID  . multivitamin  1 tablet Oral QHS  . multivitamin with minerals  1 tablet Oral Daily  . potassium chloride  40 mEq Oral Once  . sodium chloride flush  5 mL Intracatheter Q8H  . torsemide  40 mg Oral Daily   Continuous Infusions: . sodium chloride 250 mL (05/03/20 0043)  . sodium chloride Stopped (05/12/20 0620)  . anidulafungin Stopped (05/17/20 0631)  . meropenem (MERREM) IV Stopped (05/17/20 0525)     Anti-infectives (From admission, onward)   Start     Dose/Rate Route Frequency Ordered Stop   05/10/20 2200  meropenem (MERREM) 500 mg in sodium chloride 0.9 % 100 mL IVPB  Status:  Discontinued        500 mg 200 mL/hr over 30 Minutes Intravenous Every 12 hours 05/10/20 1324 05/10/20 1328   05/10/20 2200  meropenem (MERREM) 500 mg in sodium chloride 0.9 % 100 mL IVPB        500 mg 200 mL/hr over 30 Minutes Intravenous Daily 05/10/20 1328     05/09/20 2200  meropenem (MERREM) 1 g in sodium chloride 0.9 % 100 mL IVPB  Status:  Discontinued        1  g 200 mL/hr over 30 Minutes Intravenous Every 12 hours 05/09/20 2121 05/10/20 1324   05/07/20 1300  rilpivirine (EDURANT) tablet 25 mg       "And" Linked Group Details   25 mg Oral Daily with breakfast 05/07/20 1245     05/07/20 1300  dolutegravir (TIVICAY) tablet 50 mg       "And" Linked Group Details   50 mg Oral Daily with breakfast 05/07/20 1245     05/06/20 0956  meropenem (MERREM) 500 mg in sodium chloride 0.9 % 100 mL IVPB  Status:  Discontinued        500 mg 200 mL/hr over 30 Minutes Intravenous Every 24 hours 05/05/20 1018 05/05/20 1637   05/03/20 2000  anidulafungin (ERAXIS) 100 mg in sodium chloride 0.9 % 100 mL IVPB        100 mg 78 mL/hr over 100 Minutes Intravenous Every 24 hours 05/02/20 2223     05/02/20 2330  anidulafungin (ERAXIS) 200 mg in sodium chloride 0.9 % 200 mL IVPB  200 mg 78 mL/hr over 200 Minutes Intravenous  Once 05/02/20 2223 05/03/20 0455   04/30/20 0800  rilpivirine (EDURANT) tablet 25 mg  Status:  Discontinued       "And" Linked Group Details   25 mg Oral Daily with breakfast 04/29/20 1254 05/02/20 2223   04/30/20 0800  dolutegravir (TIVICAY) tablet 50 mg  Status:  Discontinued       "And" Linked Group Details   50 mg Oral Daily with breakfast 04/29/20 1254 05/02/20 2223   04/28/20 2200  meropenem (MERREM) 500 mg in sodium chloride 0.9 % 100 mL IVPB  Status:  Discontinued        500 mg 200 mL/hr over 30 Minutes Intravenous Every 12 hours 04/28/20 1546 05/05/20 1018   04/26/20 2200  meropenem (MERREM) 500 mg in sodium chloride 0.9 % 100 mL IVPB  Status:  Discontinued        500 mg 200 mL/hr over 30 Minutes Intravenous Every 8 hours 04/26/20 1258 04/28/20 1546   04/25/20 1700  rilpivirine (EDURANT) tablet 25 mg  Status:  Discontinued        25 mg Oral Daily with breakfast 04/24/20 1636 04/25/20 1111   04/25/20 1200  rilpivirine (EDURANT) tablet 25 mg  Status:  Discontinued        25 mg Oral Daily with breakfast 04/25/20 1111 04/29/20 1254    04/25/20 1000  meropenem (MERREM) 500 mg in sodium chloride 0.9 % 100 mL IVPB  Status:  Discontinued        500 mg 200 mL/hr over 30 Minutes Intravenous Every 12 hours 04/25/20 0935 04/26/20 1258   04/24/20 1700  darunavir-cobicistat (PREZCOBIX) 800-150 MG per tablet 1 tablet  Status:  Discontinued        1 tablet Oral Daily with breakfast 04/24/20 1231 04/24/20 1636   04/24/20 1330  dolutegravir (TIVICAY) tablet 50 mg  Status:  Discontinued        50 mg Oral Daily 04/24/20 1231 04/29/20 1254   04/23/20 2200  meropenem (MERREM) 500 mg in sodium chloride 0.9 % 100 mL IVPB  Status:  Discontinued        500 mg 200 mL/hr over 30 Minutes Intravenous Every 24 hours 04/23/20 1127 04/25/20 0935   04/18/20 2000  levofloxacin (LEVAQUIN) IVPB 750 mg        750 mg 100 mL/hr over 90 Minutes Intravenous  Once 04/18/20 1857 04/19/20 0004   04/18/20 1800  levofloxacin (LEVAQUIN) IVPB 750 mg  Status:  Discontinued        750 mg 100 mL/hr over 90 Minutes Intravenous  Once 04/18/20 1640 04/18/20 1857   04/16/20 2200  anidulafungin (ERAXIS) 100 mg in sodium chloride 0.9 % 100 mL IVPB        100 mg 78 mL/hr over 100 Minutes Intravenous Every 24 hours 04/16/20 1707 04/22/20 0211   04/15/20 2200  valACYclovir (VALTREX) tablet 500 mg  Status:  Discontinued        500 mg Oral Daily at bedtime 04/15/20 1248 04/16/20 1259   04/15/20 2200  anidulafungin (ERAXIS) 200 mg in sodium chloride 0.9 % 200 mL IVPB        200 mg 78 mL/hr over 200 Minutes Intravenous  Once 04/15/20 2008 04/16/20 0327   04/15/20 2200  meropenem (MERREM) 1 g in sodium chloride 0.9 % 100 mL IVPB  Status:  Discontinued        1 g 200 mL/hr over 30 Minutes Intravenous Every 12 hours 04/15/20 2017 04/23/20  1127   04/15/20 1400  piperacillin-tazobactam (ZOSYN) IVPB 3.375 g  Status:  Discontinued        3.375 g 12.5 mL/hr over 240 Minutes Intravenous Every 8 hours 04/15/20 1300 04/15/20 2006   04/14/20 2200   elvitegravir-cobicistat-emtricitabine-tenofovir (GENVOYA) 150-150-200-10 MG tablet 1 tablet  Status:  Discontinued        1 tablet Oral Daily at bedtime 04/14/20 0235 04/15/20 0957   04/14/20 2200  valACYclovir (VALTREX) tablet 1,000 mg  Status:  Discontinued        1,000 mg Oral Daily at bedtime 04/14/20 0235 04/15/20 1248   04/14/20 1230  cefTRIAXone (ROCEPHIN) 2 g in sodium chloride 0.9 % 100 mL IVPB  Status:  Discontinued        2 g 200 mL/hr over 30 Minutes Intravenous Every 24 hours 04/14/20 1223 04/15/20 1300   04/14/20 1230  metroNIDAZOLE (FLAGYL) IVPB 500 mg  Status:  Discontinued        500 mg 100 mL/hr over 60 Minutes Intravenous Every 8 hours 04/14/20 1223 04/15/20 1636   04/14/20 1030  piperacillin-tazobactam (ZOSYN) IVPB 3.375 g  Status:  Discontinued        3.375 g 12.5 mL/hr over 240 Minutes Intravenous Every 8 hours 04/14/20 0235 04/14/20 1223   04/14/20 0215  piperacillin-tazobactam (ZOSYN) IVPB 4.5 g  Status:  Discontinued        4.5 g 200 mL/hr over 30 Minutes Intravenous  Once 04/14/20 0200 04/14/20 0208   04/14/20 0215  piperacillin-tazobactam (ZOSYN) IVPB 3.375 g        3.375 g 100 mL/hr over 30 Minutes Intravenous  Once 04/14/20 0208 04/14/20 0301             Family Communication/Anticipated D/C date and plan/Code Status   DVT prophylaxis:      Code Status: Partial Code  Family Communication:  Disposition Plan:    Status is: Inpatient  Remains inpatient appropriate because:IV treatments appropriate due to intensity of illness or inability to take PO and Inpatient level of care appropriate due to severity of illness   Dispo: The patient is from: Home              Anticipated d/c is to: SNF              Anticipated d/c date is: > 3 days              Patient currently is not medically stable to d/c.           Subjective:   No new complaints.  She still has abdominal pain.  She said she is tired and wants to go home.  She said she has  been in the hospital for too long.  Objective:    Vitals:   05/16/20 2005 05/17/20 0342 05/17/20 0739 05/17/20 1556  BP: (!) 103/52 (!) 109/59 114/65 (!) 96/52  Pulse: 87 92 97 99  Resp: 18 18 (!) 22 20  Temp: 99.4 F (37.4 C) 98 F (36.7 C) 98.5 F (36.9 C) 97.7 F (36.5 C)  TempSrc: Oral Oral Oral   SpO2: 99% 96% 96% 100%  Weight:      Height:       No data found.   Intake/Output Summary (Last 24 hours) at 05/17/2020 1619 Last data filed at 05/17/2020 1400 Gross per 24 hour  Intake 337.72 ml  Output 10 ml  Net 327.72 ml   Filed Weights   05/03/20 0547 05/09/20 0951 05/14/20 1349  Weight: 130.9  kg 119 kg 124.3 kg    Exam:   GEN: No acute distress SKIN: Raw area/denuded skin on right buttock.  Denuded areas on the left buttock are healing. EYES: Anicteric ENT: Moist mucous membranes CV: Regular rate and rhythm, tachycardic PULM: Clear to auscultation bilaterally ABD: Soft, obese, tenderness in the left lower quadrant, no rebound tenderness or guarding.  + JP drain with brownish fluid CNS: Alert and oriented to person place.  No focal deficits. EXT: Bilateral leg edema has improved.  No erythema or tenderness.    Data Reviewed:   I have personally reviewed following labs and imaging studies:  Labs: Labs show the following:   Basic Metabolic Panel: Recent Labs  Lab 05/11/20 0708 05/11/20 0708 05/12/20 0744 05/12/20 0744 05/13/20 0728 05/13/20 0728 05/14/20 0604 05/14/20 0604 05/15/20 0443 05/15/20 0443 05/16/20 0440 05/16/20 0443 05/17/20 0849  NA 141   < > 140   < > 138  --  138  --  137  --  140  --  138  K 3.7   < > 3.8   < > 3.5   < > 3.8   < > 3.0*   < > 3.0*  --  3.4*  CL 102   < > 101   < > 101  --  101  --  100  --  103  --  101  CO2 28   < > 27   < > 26  --  25  --  27  --  26  --  26  GLUCOSE 70   < > 72   < > 86  --  82  --  96  --  82  --  69*  BUN 45*   < > 52*   < > 33*  --  39*  --  26*  --  30*  --  20  CREATININE 3.45*   < >  3.66*   < > 2.69*  --  2.91*  --  2.32*  --  2.46*  --  2.10*  CALCIUM 7.5*   < > 7.5*   < > 7.3*  --  7.4*  --  7.4*  --  7.4*  --  7.4*  MG 2.0  --  2.0  --   --   --   --   --   --   --  1.9  --   --   PHOS  --   --   --   --   --   --   --   --   --   --   --  3.4  --    < > = values in this interval not displayed.   GFR Estimated Creatinine Clearance: 39.3 mL/min (A) (by C-G formula based on SCr of 2.1 mg/dL (H)). Liver Function Tests: Recent Labs  Lab 05/14/20 0604 05/16/20 0440  AST 25 25  ALT 23 24  ALKPHOS 78 81  BILITOT 1.3* 1.0  PROT 5.3* 5.3*  ALBUMIN 1.1* 1.1*   No results for input(s): LIPASE, AMYLASE in the last 168 hours. No results for input(s): AMMONIA in the last 168 hours. Coagulation profile Recent Labs  Lab 05/15/20 0443  INR 1.2    CBC: Recent Labs  Lab 05/13/20 0728 05/14/20 0604 05/15/20 0444 05/16/20 0440 05/17/20 0849  WBC 14.3* 19.9* 28.8* 29.5* 32.4*  NEUTROABS  --   --   --  19.3* 22.3*  HGB 7.3* 7.4* 7.0*  8.0* 7.3*  HCT 22.6* 22.4* 22.6* 24.6* 23.2*  MCV 90.8 91.4 93.4 91.4 92.1  PLT 390 381 338 283 228   Cardiac Enzymes: No results for input(s): CKTOTAL, CKMB, CKMBINDEX, TROPONINI in the last 168 hours. BNP (last 3 results) No results for input(s): PROBNP in the last 8760 hours. CBG: No results for input(s): GLUCAP in the last 168 hours. D-Dimer: No results for input(s): DDIMER in the last 72 hours. Hgb A1c: No results for input(s): HGBA1C in the last 72 hours. Lipid Profile: No results for input(s): CHOL, HDL, LDLCALC, TRIG, CHOLHDL, LDLDIRECT in the last 72 hours. Thyroid function studies: No results for input(s): TSH, T4TOTAL, T3FREE, THYROIDAB in the last 72 hours.  Invalid input(s): FREET3 Anemia work up: No results for input(s): VITAMINB12, FOLATE, FERRITIN, TIBC, IRON, RETICCTPCT in the last 72 hours. Sepsis Labs: Recent Labs  Lab 05/14/20 0604 05/15/20 0444 05/16/20 0440 05/17/20 0849  WBC 19.9* 28.8* 29.5*  32.4*    Microbiology Recent Results (from the past 240 hour(s))  Aerobic/Anaerobic Culture (surgical/deep wound)     Status: None (Preliminary result)   Collection Time: 05/15/20  4:36 PM   Specimen: Abdomen; Abdominal Fluid  Result Value Ref Range Status   Specimen Description   Final    ABDOMEN Performed at [facility], [address], [place], [place] [address]    Special Requests   Final    ABDOMINAL FLUID Performed at [facility], [address] [place], [place] [address]    Gram Stain   Final    RARE WBC PRESENT, PREDOMINANTLY MONONUCLEAR NO ORGANISMS SEEN    Culture   Final    NO GROWTH 2 DAYS NO ANAEROBES ISOLATED; CULTURE IN PROGRESS FOR 5 DAYS Performed at [facility] [address], [place], [place] [address]    Report Status PENDING  Incomplete  Body fluid culture     Status: None (Preliminary result)   Collection Time: 05/16/20  6:37 PM   Specimen: JP Drain; Body Fluid  Result Value Ref Range Status   Specimen Description   Final    JP DRAINAGE Performed at [facility], [address] [place], [place] [address]    Special Requests   Final    NONE Performed at [facility], [address] [place], [place] [address]    Gram Stain   Final    FEW WBC PRESENT,BOTH PMN AND MONONUCLEAR ABUNDANT GRAM POSITIVE COCCI MODERATE GRAM NEGATIVE RODS    Culture   Final    FEW PSEUDOMONAS AERUGINOSA CULTURE REINCUBATED FOR BETTER GROWTH Performed at [facility], [place] [address] [place], [place] [address]    Report Status PENDING  Incomplete    Procedures and diagnostic studies:  CT IMAGE GUIDED DRAINAGE BY PERCUTANEOUS CATHETER  Result Date: 05/16/2020 INDICATION: 59 year old female with history of diverticulitis complicated by intra-abdominal abscess with prior lower abdominal drain placement, now malpositioned on recent CT abdomen pelvis. Additionally there are new intra-abdominal fluid collections in  the upper abdomen, largest in the left mid abdomen. EXAM: CT GUIDED aspiration OF abdominal fluid collection CT-guided exchange of indwelling abscess drain MEDICATIONS: The patient is currently admitted to the hospital and receiving intravenous antibiotics. The antibiotics were administered within an appropriate time frame prior to the initiation of the procedure. ANESTHESIA/SEDATION: 0 mg IV Versed 50 mcg IV Fentanyl Moderate Sedation Time:  0 The patient was continuously monitored during the procedure by the interventional radiology nurse under my direct supervision. COMPLICATIONS:  None immediate. TECHNIQUE: Informed written consent was obtained from the patient after a thorough discussion of the procedural risks, benefits and alternatives. All questions were addressed. Maximal Sterile Barrier Technique was utilized including caps, mask, sterile gowns, sterile gloves, sterile drape, hand hygiene and skin antiseptic. A timeout was performed prior to the initiation of the procedure. PROCEDURE: The left upper quadrant and indwelling lower abdominal drain were prepped with Chlorhexidine in a sterile fashion, and a sterile drape was applied covering the operative fields. A sterile gown and sterile gloves were used for the procedure. Local anesthesia was provided with 1% Lidocaine. 91 gauge trocar needle was directed into the left abdominal fluid collection from an anterior approach. Aspiration was performed. A sample was collected and sent to lab for culture. Next, the indwelling 12 [place] lower abdominal fluid collection drain was exchanged over an Amplatz wire for a 14 [place] drain. The pigtail 4 shin was locked in the deep part of the fluid collection. The drain was sutured in place and a dressing was applied. FINDINGS: Left abdominal fluid collection yielded approximately 250 mL of serous fluid. The fluid collection was essentially absent after aspiration. Given the non purulent nature of this fluid, no drain was  placed. The indwelling lower abdominal drain appears in better position than comparison CT, with the pigtail portion in the fluid collection and the radiopaque marker deep to the rectus she. There was succus draining from around the catheter entry site. Successful exchange with 14 French drain, now position deeper within the fluid collection. IMPRESSION: 1. Left upper abdominal fluid collection yielded serous fluid, therefore no drain was placed. Recommend follow-up fluid culture. 2. Successful upsize and repositioning of the indwelling lower abdominal drain from 12 [place] to 14 [place]. Given feculent output around the drain and within the drain bulb upon completion of the procedure, it is suspected that the draining fluid collection represents perforation of a diverticulum in the setting of diverticulitis. Electronically Signed   By: Ruthann Cancer MD   On: 05/16/2020 07:49               LOS: 27 days   [facility]   Pager on [email or web address]. If 7PM-7AM, please contact night-coverage at [email or web address]     05/17/2020, 4:19 PM

## 2020-05-17 NOTE — Progress Notes (Addendum)
[facility]  ROUNDING NOTE   Subjective:   Denies any issues with dialysis yesterday. Denies any cramping, lightheadedness, edema.   Objective:  Vital signs in last 24 hours:  Temp:  [98 F (36.7 C)-99.4 F (37.4 C)] 98.5 F (36.9 C) (09/18 0739) Pulse Rate:  [87-111] 97 (09/18 0739) Resp:  [17-22] 22 (09/18 0739) BP: (103-118)/(52-65) 114/65 (09/18 0739) SpO2:  [96 %-99 %] 96 % (09/18 0739)  Weight change:  Filed Weights   05/03/20 0547 05/09/20 0951 05/14/20 1349  Weight: 130.9 kg 119 kg 124.3 kg    Intake/Output: I/O last 3 completed shifts: In: 492.4 [Blood:343; IV Piggyback:149.4] Out: 40 [Drains:40]   Intake/Output this shift:  No intake/output data recorded.  Physical Exam: General: NAD,   Head: Normocephalic, atraumatic. Moist oral mucosal membranes  Eyes: Anicteric, PERRL  Neck: Supple, trachea midline  Lungs:  Clear to auscultation  Heart: Regular rate and rhythm  Abdomen:  Soft, nontender,  Drain present    Extremities:  no peripheral edema.  Neurologic: Nonfocal, moving all four extremities  Skin: No lesions  Access: RIJ permcath     Basic Metabolic Panel: Recent Labs  Lab 05/11/20 0708 05/11/20 0708 05/12/20 0744 05/12/20 0744 05/13/20 0728 05/13/20 0728 05/14/20 0604 05/14/20 0604 05/15/20 0443 05/16/20 0440 05/16/20 0443 05/17/20 0849  NA 141   < > 140   < > 138  --  138  --  137 140  --  138  K 3.7   < > 3.8   < > 3.5  --  3.8  --  3.0* 3.0*  --  3.4*  CL 102   < > 101   < > 101  --  101  --  100 103  --  101  CO2 28   < > 27   < > 26  --  25  --  27 26  --  26  GLUCOSE 70   < > 72   < > 86  --  82  --  96 82  --  69*  BUN 45*   < > 52*   < > 33*  --  39*  --  26* 30*  --  20  CREATININE 3.45*   < > 3.66*   < > 2.69*  --  2.91*  --  2.32* 2.46*  --  2.10*  CALCIUM 7.5*   < > 7.5*   < > 7.3*   < > 7.4*   < > 7.4* 7.4*  --  7.4*  MG 2.0  --  2.0  --   --   --   --   --   --  1.9  --   --   PHOS  --   --   --   --   --    --   --   --   --   --  3.4  --    < > = values in this interval not displayed.    Liver Function Tests: Recent Labs  Lab 05/14/20 0604 05/16/20 0440  AST 25 25  ALT 23 24  ALKPHOS 78 81  BILITOT 1.3* 1.0  PROT 5.3* 5.3*  ALBUMIN 1.1* 1.1*   No results for input(s): LIPASE, AMYLASE in the last 168 hours. No results for input(s): AMMONIA in the last 168 hours.  CBC: Recent Labs  Lab 05/13/20 0728 05/14/20 0604 05/15/20 0444 05/16/20 0440 05/17/20 0849  WBC 14.3* 19.9* 28.8* 29.5* 32.4*  NEUTROABS  --   --   --  19.3* 22.3*  HGB 7.3* 7.4* 7.0* 8.0* 7.3*  HCT 22.6* 22.4* 22.6* 24.6* 23.2*  MCV 90.8 91.4 93.4 91.4 92.1  PLT 390 381 338 283 228    Cardiac Enzymes: No results for input(s): CKTOTAL, CKMB, CKMBINDEX, TROPONINI in the last 168 hours.  BNP: Invalid input(s): POCBNP  CBG: No results for input(s): GLUCAP in the last 168 hours.  Microbiology: Results for orders placed or performed during the hospital encounter of 04/14/20  SARS Coronavirus 2 by RT PCR (hospital order, performed in [facility] hospital lab) Nasopharyngeal Nasopharyngeal Swab     Status: None   Collection Time: 04/14/20  2:17 AM   Specimen: Nasopharyngeal Swab  Result Value Ref Range Status   SARS Coronavirus 2 NEGATIVE NEGATIVE Final    Comment: (NOTE) SARS-CoV-2 target nucleic acids are NOT DETECTED.  The SARS-CoV-2 RNA is generally detectable in upper and lower respiratory specimens during the acute phase of infection. The lowest concentration of SARS-CoV-2 viral copies this assay can detect is 250 copies / mL. A negative result does not preclude SARS-CoV-2 infection and should not be used as the sole basis for treatment or other patient management decisions.  A negative result may occur with improper specimen collection / handling, submission of specimen other than nasopharyngeal swab, presence of viral mutation(s) within the areas targeted by this assay, and inadequate number of  viral copies (<250 copies / mL). A negative result must be combined with clinical observations, patient history, and epidemiological information.  Fact Sheet for Patients:   StrictlyIdeas.no  Fact Sheet for Healthcare Providers: BankingDealers.co.za  This test is not yet approved or  cleared by the [place] FDA and has been authorized for detection and/or diagnosis of SARS-CoV-2 by FDA under an Emergency Use Authorization (EUA).  This EUA will remain in effect (meaning this test can be used) for the duration of the COVID-19 declaration under Section 564(b)(1) of the Act, 21 U.S.C. section 360bbb-3(b)(1), unless the authorization is terminated or revoked sooner.  Performed at [facility], [address] [place], [place] [address]   CULTURE, BLOOD (ROUTINE X 2) w Reflex to ID Panel     Status: None   Collection Time: 04/14/20  8:14 AM   Specimen: BLOOD  Result Value Ref Range Status   Specimen Description BLOOD LEFT [facility]  Final   Special Requests   Final    BOTTLES DRAWN AEROBIC AND ANAEROBIC Blood Culture adequate volume   Culture   Final    NO GROWTH 5 DAYS Performed at [facility] [address] [place], [place] [address]    Report Status 04/19/2020 FINAL  Final  CULTURE, BLOOD (ROUTINE X 2) w Reflex to ID Panel     Status: None   Collection Time: 04/14/20  8:14 AM   Specimen: BLOOD  Result Value Ref Range Status   Specimen Description BLOOD LEFT WRIST  Final   Special Requests   Final    BOTTLES DRAWN AEROBIC AND ANAEROBIC Blood Culture adequate volume   Culture   Final    NO GROWTH 5 DAYS Performed at [facility], [address], [place], [place] [address]    Report Status 04/19/2020 FINAL  Final  Aerobic/Anaerobic Culture (surgical/deep wound)     Status: None   Collection Time: 04/14/20 11:14 AM   Specimen: Abscess  Result Value Ref Range Status   Specimen Description   Final     ABSCESS  Performed at [facility], [address], [place], [place] [address]    Special Requests   Final    NONE Performed at [facility], [place], [place] [address]    Gram Stain   Final    NO WBC SEEN ABUNDANT GRAM NEGATIVE RODS FEW GRAM POSITIVE COCCI FEW GRAM POSITIVE RODS Performed at [facility], [place] [address] [place], [place] [address]    Culture   Final    ABUNDANT KLEBSIELLA PNEUMONIAE ABUNDANT PSEUDOMONAS AERUGINOSA MODERATE ESCHERICHIA COLI Confirmed Extended Spectrum Beta-Lactamase Producer (ESBL).  In bloodstream infections from ESBL organisms, carbapenems are preferred over piperacillin/tazobactam. They are shown to have a lower risk of mortality. MIXED ANAEROBIC FLORA PRESENT.  CALL LAB IF FURTHER IID REQUIRED.    Report Status 04/20/2020 FINAL  Final   Organism ID, Bacteria KLEBSIELLA PNEUMONIAE  Final   Organism ID, Bacteria PSEUDOMONAS AERUGINOSA  Final   Organism ID, Bacteria ESCHERICHIA COLI  Final      Susceptibility   Escherichia coli - MIC*    AMPICILLIN >=32 RESISTANT Resistant     CEFAZOLIN >=64 RESISTANT Resistant     CEFEPIME 16 RESISTANT Resistant     CEFTAZIDIME RESISTANT Resistant     CEFTRIAXONE >=64 RESISTANT Resistant     CIPROFLOXACIN >=4 RESISTANT Resistant     GENTAMICIN <=1 SENSITIVE Sensitive     IMIPENEM <=0.25 SENSITIVE Sensitive     TRIMETH/SULFA >=320 RESISTANT Resistant     AMPICILLIN/SULBACTAM >=32 RESISTANT Resistant     PIP/TAZO 8 SENSITIVE Sensitive     * MODERATE ESCHERICHIA COLI   Klebsiella pneumoniae - MIC*    AMPICILLIN >=32 RESISTANT Resistant     CEFAZOLIN <=4 SENSITIVE Sensitive     CEFEPIME <=0.12 SENSITIVE Sensitive     CEFTAZIDIME <=1 SENSITIVE Sensitive     CEFTRIAXONE <=0.25 SENSITIVE Sensitive     CIPROFLOXACIN <=0.25 SENSITIVE Sensitive     GENTAMICIN <=1 SENSITIVE Sensitive     IMIPENEM <=0.25 SENSITIVE Sensitive     TRIMETH/SULFA <=20 SENSITIVE Sensitive      AMPICILLIN/SULBACTAM 16 INTERMEDIATE Intermediate     PIP/TAZO 8 SENSITIVE Sensitive     * ABUNDANT KLEBSIELLA PNEUMONIAE   Pseudomonas aeruginosa - MIC*    CEFTAZIDIME 4 SENSITIVE Sensitive     CIPROFLOXACIN <=0.25 SENSITIVE Sensitive     GENTAMICIN 2 SENSITIVE Sensitive     IMIPENEM 2 SENSITIVE Sensitive     PIP/TAZO 8 SENSITIVE Sensitive     CEFEPIME 4 SENSITIVE Sensitive     * ABUNDANT PSEUDOMONAS AERUGINOSA  MRSA PCR Screening     Status: None   Collection Time: 04/14/20  6:19 PM   Specimen: Nasopharyngeal  Result Value Ref Range Status   MRSA by PCR NEGATIVE NEGATIVE Final    Comment:        The GeneXpert MRSA Assay (FDA approved for NASAL specimens only), is one component of a comprehensive MRSA colonization surveillance program. It is not intended to diagnose MRSA infection nor to guide or monitor treatment for MRSA infections. Performed at [facility], [address] [place], [place] [address]   Urine Culture     Status: None   Collection Time: 04/15/20  4:28 AM   Specimen: Urine, Random  Result Value Ref Range Status   Specimen Description   Final    URINE, RANDOM Performed at [facility], [address], [place],  [address]    Special Requests   Final    NONE Performed  at [facility], [address], [place], [place] [address]    Culture   Final    NO GROWTH Performed at [facility], [place] [address], [place], [place] [address]    Report Status 04/16/2020 FINAL  Final  CULTURE, BLOOD (ROUTINE X 2) w Reflex to ID Panel     Status: None   Collection Time: 04/22/20  6:12 PM   Specimen: BLOOD  Result Value Ref Range Status   Specimen Description BLOOD FOOT  Final   Special Requests   Final    BOTTLES DRAWN AEROBIC AND ANAEROBIC Blood Culture adequate volume   Culture   Final    NO GROWTH 5 DAYS Performed at [facility], [address] [place], [place] [address]    Report Status 04/27/2020 FINAL   Final  CULTURE, BLOOD (ROUTINE X 2) w Reflex to ID Panel     Status: None   Collection Time: 04/22/20  6:17 PM   Specimen: BLOOD  Result Value Ref Range Status   Specimen Description BLOOD FOOT  Final   Special Requests   Final    BOTTLES DRAWN AEROBIC AND ANAEROBIC Blood Culture results may not be optimal due to an excessive volume of blood received in culture bottles   Culture   Final    NO GROWTH 5 DAYS Performed at [facility], [address] [place], [place] [address]    Report Status 04/27/2020 FINAL  Final  CULTURE, BLOOD (ROUTINE X 2) w Reflex to ID Panel     Status: None   Collection Time: 05/01/20  2:50 PM   Specimen: BLOOD  Result Value Ref Range Status   Specimen Description BLOOD BLOOD LEFT HAND  Final   Special Requests   Final    BOTTLES DRAWN AEROBIC AND ANAEROBIC Blood Culture adequate volume   Culture   Final    NO GROWTH 5 DAYS Performed at [facility], [address] [place], [place] [address]    Report Status 05/06/2020 FINAL  Final  CULTURE, BLOOD (ROUTINE X 2) w Reflex to ID Panel     Status: None   Collection Time: 05/01/20  2:51 PM   Specimen: BLOOD  Result Value Ref Range Status   Specimen Description BLOOD BLOOD RIGHT HAND  Final   Special Requests   Final    BOTTLES DRAWN AEROBIC AND ANAEROBIC Blood Culture adequate volume   Culture   Final    NO GROWTH 5 DAYS Performed at [facility], [address], [place], [place] [address]    Report Status 05/06/2020 FINAL  Final  Aerobic/Anaerobic Culture (surgical/deep wound)     Status: None (Preliminary result)   Collection Time: 05/15/20  4:36 PM   Specimen: Abdomen; Abdominal Fluid  Result Value Ref Range Status   Specimen Description   Final    ABDOMEN Performed at [facility], [address], [place], [place] [address]    Special Requests   Final    ABDOMINAL FLUID Performed at [facility], [address] [place], [place] [address]     Gram Stain   Final    RARE WBC PRESENT, PREDOMINANTLY MONONUCLEAR NO ORGANISMS SEEN    Culture   Final    NO GROWTH 2 DAYS NO ANAEROBES ISOLATED; CULTURE IN PROGRESS FOR 5 DAYS Performed at [facility] [address], [place],  [address]    Report Status PENDING  Incomplete  Body fluid culture  Status: None (Preliminary result)   Collection Time: 05/16/20  6:37 PM   Specimen: JP Drain; Body Fluid  Result Value Ref Range Status   Specimen Description   Final    JP DRAINAGE Performed at [facility], [address], [place], [place] [address]    Special Requests   Final    NONE Performed at [facility], [place], [place] [address]    Gram Stain   Final    FEW WBC PRESENT,BOTH PMN AND MONONUCLEAR ABUNDANT GRAM POSITIVE COCCI MODERATE GRAM NEGATIVE RODS    Culture   Final    FEW GRAM NEGATIVE RODS CULTURE REINCUBATED FOR BETTER GROWTH Performed at [facility], [place] [address], [place], [place] [address]    Report Status PENDING  Incomplete    Coagulation Studies: Recent Labs    05/15/20 0443  LABPROT 14.7  INR 1.2    Urinalysis: No results for input(s): COLORURINE, LABSPEC, PHURINE, GLUCOSEU, HGBUR, BILIRUBINUR, KETONESUR, PROTEINUR, UROBILINOGEN, NITRITE, LEUKOCYTESUR in the last 72 hours.  Invalid input(s): APPERANCEUR    Imaging: CT IMAGE GUIDED DRAINAGE BY PERCUTANEOUS CATHETER  Result Date: 05/16/2020 INDICATION: 59 year old female with history of diverticulitis complicated by intra-abdominal abscess with prior lower abdominal drain placement, now malpositioned on recent CT abdomen pelvis. Additionally there are new intra-abdominal fluid collections in the upper abdomen, largest in the left mid abdomen. EXAM: CT GUIDED aspiration OF abdominal fluid collection CT-guided exchange of indwelling abscess drain MEDICATIONS: The patient is currently admitted to the hospital and receiving intravenous antibiotics.  The antibiotics were administered within an appropriate time frame prior to the initiation of the procedure. ANESTHESIA/SEDATION: 0 mg IV Versed 50 mcg IV Fentanyl Moderate Sedation Time:  0 The patient was continuously monitored during the procedure by the interventional radiology nurse under my direct supervision. COMPLICATIONS: None immediate. TECHNIQUE: Informed written consent was obtained from the patient after a thorough discussion of the procedural risks, benefits and alternatives. All questions were addressed. Maximal Sterile Barrier Technique was utilized including caps, mask, sterile gowns, sterile gloves, sterile drape, hand hygiene and skin antiseptic. A timeout was performed prior to the initiation of the procedure. PROCEDURE: The left upper quadrant and indwelling lower abdominal drain were prepped with Chlorhexidine in a sterile fashion, and a sterile drape was applied covering the operative fields. A sterile gown and sterile gloves were used for the procedure. Local anesthesia was provided with 1% Lidocaine. 97 gauge trocar needle was directed into the left abdominal fluid collection from an anterior approach. Aspiration was performed. A sample was collected and sent to lab for culture. Next, the indwelling 12 [place] lower abdominal fluid collection drain was exchanged over an Amplatz wire for a 14 [place] drain. The pigtail 4 shin was locked in the deep part of the fluid collection. The drain was sutured in place and a dressing was applied. FINDINGS: Left abdominal fluid collection yielded approximately 250 mL of serous fluid. The fluid collection was essentially absent after aspiration. Given the non purulent nature of this fluid, no drain was placed. The indwelling lower abdominal drain appears in better position than comparison CT, with the pigtail portion in the fluid collection and the radiopaque marker deep to the rectus she. There was succus draining from around the catheter entry site.  Successful exchange with 14 French drain, now position deeper within the fluid collection. IMPRESSION: 1. Left upper abdominal fluid collection yielded serous fluid, therefore no drain was placed. Recommend follow-up fluid culture. 2. Successful  upsize and repositioning of the indwelling lower abdominal drain from 12 [place] to 14 [place]. Given feculent output around the drain and within the drain bulb upon completion of the procedure, it is suspected that the draining fluid collection represents perforation of a diverticulum in the setting of diverticulitis. Electronically Signed   By: Ruthann Cancer MD   On: 05/16/2020 07:49     Medications:   . sodium chloride 250 mL (05/03/20 0043)  . sodium chloride Stopped (05/12/20 0620)  . anidulafungin 100 mg (05/17/20 0450)  . meropenem (MERREM) IV 500 mg (05/17/20 0451)   . sodium chloride   Intravenous Once  . acidophilus  1 capsule Oral Daily  . alteplase  2 mg Intracatheter Once  . alteplase  2 mg Intracatheter Once  . amiodarone  200 mg Oral BID  . Chlorhexidine Gluconate Cloth  6 each Topical Daily  . rilpivirine  25 mg Oral Q breakfast   And  . dolutegravir  50 mg Oral Q breakfast  . dronabinol  5 mg Oral BID AC  . epoetin (EPOGEN/PROCRIT) injection  10,000 Units Intravenous Q M,W,F-HD  . famotidine  10 mg Oral Q1200  . feeding supplement (ENSURE ENLIVE)  237 mL Oral TID BM  . guaiFENesin-dextromethorphan  10 mL Oral Q8H  . heparin injection (subcutaneous)  5,000 Units Subcutaneous Q8H  . magnesium oxide  400 mg Oral Daily  . mometasone-formoterol  2 puff Inhalation BID  . multivitamin  1 tablet Oral QHS  . multivitamin with minerals  1 tablet Oral Daily  . sodium chloride flush  5 mL Intracatheter Q8H  . torsemide  40 mg Oral Daily   sodium chloride, acetaminophen **OR** acetaminophen, albuterol, ALPRAZolam, bisacodyl, diphenhydrAMINE, docusate sodium, fentaNYL, heparin NICU/SCN flush, HYDROmorphone (DILAUDID) injection, iohexol,  ipratropium-albuterol, magic mouthwash **AND** lidocaine, magnesium hydroxide, menthol-cetylpyridinium, metoprolol tartrate, oxyCODONE-acetaminophen, phenol, simethicone  Assessment/ Plan:  Melanie Bowen is a 59 y.o.  female with HIV, COPD, HTN , GERD, who is admitted for Dyspne and  colonic diverticular abcess.   1. AKI requiring hemodialysis.  - patient currently on MWF schedule. No indication for dialysis at this time.  - creatinine has improved to 2.10 and eGFR 25.   2. Anemia of CKD  - patient has required transfusions and received 10,000 units epogen with dialysis - hgb 7.3.   3. Hypotension  - bp 114/64   4. Hypokalemia  - potassium 3.4 - has received a single dose of Klor-Con tablet 40 mEq, will reorder   LOS: [place] 9/18/20211:11 PM  Patient was seen and examined with Zonia Kief PA- C. Above plan was discussed and agreed upon on the signing of this note.   Melanie Dana, MD [facility]  9/18/20213:37 PM

## 2020-05-17 NOTE — Progress Notes (Signed)
[place] Day(s): 33.   Post op day(s): 22 Days Post-Op.   Interval History: Patient seen and examined, no acute events or new complaints overnight. Patient reports waking up with good spirit this morning.  Denies any abdominal pain today.  Denies nausea or vomiting.  Reports passing flatus.  Patient evaluation.  There is no alleviating or rating factor.  Vital signs in last 24 hours: [min-max] current  Temp:  [98 F (36.7 C)-99.4 F (37.4 C)] 98.5 F (36.9 C) (09/18 0739) Pulse Rate:  [87-111] 97 (09/18 0739) Resp:  [17-22] 22 (09/18 0739) BP: (103-118)/(52-65) 114/65 (09/18 0739) SpO2:  [96 %-99 %] 96 % (09/18 0739)     Height: 5\' 6"  (167.6 cm) Weight: 124.3 kg BMI (Calculated): 44.26   Physical Exam:  Constitutional: alert, cooperative and no distress  Respiratory: breathing non-labored at rest  Cardiovascular: regular rate and sinus rhythm  Gastrointestinal: soft, non-tender, and non-distended.  Drain in place bloody and purulent output  Labs:  CBC Latest Ref Rng & Units 05/16/2020 05/15/2020 05/14/2020  WBC 4.0 - 10.5 K/uL 29.5(H) 28.8(H) 19.9(H)  Hemoglobin 12.0 - 15.0 g/dL 8.0(L) 7.0(L) 7.4(L)  Hematocrit 36 - 46 % 24.6(L) 22.6(L) 22.4(L)  Platelets 150 - 400 K/uL 283 338 381   CMP Latest Ref Rng & Units 05/16/2020 05/15/2020 05/14/2020  Glucose 70 - 99 mg/dL 82 96 82  BUN 6 - 20 mg/dL 30(H) 26(H) 39(H)  Creatinine 0.44 - 1.00 mg/dL 2.46(H) 2.32(H) 2.91(H)  Sodium 135 - 145 mmol/L 140 137 138  Potassium 3.5 - 5.1 mmol/L 3.0(L) 3.0(L) 3.8  Chloride 98 - 111 mmol/L 103 100 101  CO2 22 - 32 mmol/L 26 27 25   Calcium 8.9 - 10.3 mg/dL 7.4(L) 7.4(L) 7.4(L)  Total Protein 6.5 - 8.1 g/dL 5.3(L) - 5.3(L)  Total Bilirubin 0.3 - 1.2 mg/dL 1.0 - 1.3(H)  Alkaline Phos 38 - 126 U/L 81 - 78  AST 15 - 41 U/L 25 - 25  ALT 0 - 44 U/L 24 - 23    Imaging studies: I personally evaluated the CT-guided drainage images on yesterday   Assessment/Plan:  59 y.o.  female with perforated diverticulitis with abscess 22 Days Post-Op s/p percutaneous drainage, complicated by pertinent comorbidities including hypertension, morbid obesity, atrial fibrillation, COPD, HIV infection.  Patient with successful upsizing of the drain done yesterday.  Hopefully this will help to keep controlling the source of infection.  Today there has been no lab taking, pending labs ordered for this morning.  Will follow closely.  Otherwise patient will continue with IV antibiotic therapy and current management do appreciate hospitalist management of medical conditions.  We will continue to follow to assist with management of the drains and complicated diverticulitis.  Arnold Long, MD

## 2020-05-18 LAB — CBC WITH DIFFERENTIAL/PLATELET
Abs Immature Granulocytes: 3.89 10*3/uL — ABNORMAL HIGH (ref 0.00–0.07)
Basophils Absolute: 0.1 10*3/uL (ref 0.0–0.1)
Basophils Relative: 0 %
Eosinophils Absolute: 0.1 10*3/uL (ref 0.0–0.5)
Eosinophils Relative: 0 %
HCT: 24.4 % — ABNORMAL LOW (ref 36.0–46.0)
Hemoglobin: 7.9 g/dL — ABNORMAL LOW (ref 12.0–15.0)
Immature Granulocytes: 12 %
Lymphocytes Relative: 7 %
Lymphs Abs: 2.1 10*3/uL (ref 0.7–4.0)
MCH: 29.6 pg (ref 26.0–34.0)
MCHC: 32.4 g/dL (ref 30.0–36.0)
MCV: 91.4 fL (ref 80.0–100.0)
Monocytes Absolute: 3 10*3/uL — ABNORMAL HIGH (ref 0.1–1.0)
Monocytes Relative: 9 %
Neutro Abs: 22.8 10*3/uL — ABNORMAL HIGH (ref 1.7–7.7)
Neutrophils Relative %: 72 %
Platelets: 251 10*3/uL (ref 150–400)
RBC: 2.67 MIL/uL — ABNORMAL LOW (ref 3.87–5.11)
RDW: 18.4 % — ABNORMAL HIGH (ref 11.5–15.5)
Smear Review: NORMAL
WBC: 32 10*3/uL — ABNORMAL HIGH (ref 4.0–10.5)
nRBC: 0.9 % — ABNORMAL HIGH (ref 0.0–0.2)

## 2020-05-18 LAB — BASIC METABOLIC PANEL
Anion gap: 8 (ref 5–15)
BUN: 23 mg/dL — ABNORMAL HIGH (ref 6–20)
CO2: 28 mmol/L (ref 22–32)
Calcium: 7.3 mg/dL — ABNORMAL LOW (ref 8.9–10.3)
Chloride: 103 mmol/L (ref 98–111)
Creatinine, Ser: 2.01 mg/dL — ABNORMAL HIGH (ref 0.44–1.00)
GFR calc Af Amer: 31 mL/min — ABNORMAL LOW (ref >60)
GFR calc non Af Amer: 27 mL/min — ABNORMAL LOW (ref >60)
Glucose, Bld: 79 mg/dL (ref 70–99)
Potassium: 3.5 mmol/L (ref 3.5–5.1)
Sodium: 139 mmol/L (ref 135–145)

## 2020-05-18 MED ORDER — SODIUM CHLORIDE 0.9 % IV SOLN
1.0000 g | Freq: Two times a day (BID) | INTRAVENOUS | Status: DC
  Administered 2020-05-19: 1 g via INTRAVENOUS
  Filled 2020-05-18 (×3): qty 1

## 2020-05-18 MED ORDER — CHLORHEXIDINE GLUCONATE CLOTH 2 % EX PADS
6.0000 | MEDICATED_PAD | Freq: Every day | CUTANEOUS | Status: DC
  Administered 2020-05-19 – 2020-05-23 (×5): 6 via TOPICAL

## 2020-05-18 NOTE — Progress Notes (Signed)
Currently not appropriate for rectal tube insertion, pt has type 5 stools overnight.

## 2020-05-18 NOTE — Progress Notes (Signed)
[facility] Day(s): 34.   Post op day(s): 23 Days Post-Op.   Interval History: Patient seen and examined, no acute events or new complaints overnight. Patient was found sleepy but arousable.  Not to cooperative today since she was sleeping.  Did not complain of any new issues.  Did not complain of pain at the moment of my evaluation.  Vital signs in last 24 hours: [min-max] current  Temp:  [97.7 F (36.5 C)-98.2 F (36.8 C)] 98 F (36.7 C) (09/19 0402) Pulse Rate:  [87-99] 87 (09/19 0402) Resp:  [20] 20 (09/19 0402) BP: (96-110)/(52-64) 109/58 (09/19 0402) SpO2:  [97 %-100 %] 98 % (09/19 0402)     Height: 5\' 6"  (167.6 cm) Weight: 124.3 kg BMI (Calculated): 44.26   Physical Exam:  Constitutional: alert, cooperative and no distress  Respiratory: breathing non-labored at rest  Cardiovascular: regular rate and sinus rhythm  Gastrointestinal: soft, non-tender, and non-distended.  Drain with purulent output  Labs:  CBC Latest Ref Rng & Units 05/18/2020 05/17/2020 05/16/2020  WBC 4.0 - 10.5 K/uL 32.0(H) 32.4(H) 29.5(H)  Hemoglobin 12.0 - 15.0 g/dL 7.9(L) 7.3(L) 8.0(L)  Hematocrit 36 - 46 % 24.4(L) 23.2(L) 24.6(L)  Platelets 150 - 400 K/uL 251 228 283   CMP Latest Ref Rng & Units 05/18/2020 05/17/2020 05/16/2020  Glucose 70 - 99 mg/dL 79 69(L) 82  BUN 6 - 20 mg/dL 23(H) 20 30(H)  Creatinine 0.44 - 1.00 mg/dL 2.01(H) 2.10(H) 2.46(H)  Sodium 135 - 145 mmol/L 139 138 140  Potassium 3.5 - 5.1 mmol/L 3.5 3.4(L) 3.0(L)  Chloride 98 - 111 mmol/L 103 101 103  CO2 22 - 32 mmol/L 28 26 26   Calcium 8.9 - 10.3 mg/dL 7.3(L) 7.4(L) 7.4(L)  Total Protein 6.5 - 8.1 g/dL - - 5.3(L)  Total Bilirubin 0.3 - 1.2 mg/dL - - 1.0  Alkaline Phos 38 - 126 U/L - - 81  AST 15 - 41 U/L - - 25  ALT 0 - 44 U/L - - 24    Imaging studies: No new pertinent imaging studies   Assessment/Plan:  59 y.o. female with perforated diverticulitis with abscess 22 Days Post-Op s/p percutaneous  drainage, complicated by pertinent comorbidities including hypertension, morbid obesity, atrial fibrillation, COPD, HIV infection.  Patient today without any clinical deterioration.  Continue afebrile and no tachycardia.  The white blood cell count today plateaued. Hopefully this will start to trend down. Renal function improving slowly.    The goal is that the infection should be controlled with the drain and antibiotic therapy.  Patient very poor candidate for any surgical management at this moment.  We will continue with current management at this moment.  We will continue to follow closely to assist in the management of the drain and the diverticulitis.  Arnold Long, MD

## 2020-05-18 NOTE — Progress Notes (Signed)
Progress Note    Melanie Bowen  RUE:[id number] DOB: 10-22-1960  DOA: 04/14/2020 PCP: Theotis Burrow, MD      Brief Narrative:    Medical records reviewed and are as summarized below:  Melanie Bowen is a 59 y.o. female with medical history significant for hypertension, diverticulosis, enteric infection, morbid obesity, GERD, COPD, history of stroke, asthma, depression, seizures enzymes with acute onset of abdominal pain nausea vomiting and constipation.   She was admitted to the hospital for septic shock secondary to acute diverticulitis with diverticular abscess.  She was treated with IV fluids and empiric IV antimicrobial therapy.  She required vasopressors for septic shock.  She was seen by interventional radiologist and CT-guided drainage was done by IR on 04/14/2020.  Fluid culture showed Pseudomonas aeruginosa, Klebsiella pneumonia and E. coli.  Septic shock was complicated by acute kidney injury, acute liver failure, acute metabolic encephalopathy and acute hypoxemic respiratory failure.  She was seen by the nephrologist for AKI.  Patient was started on hemodialysis for AKI. She developed atrial fibrillation with RVR.  She was seen in consultation by the cardiologist as well      Assessment/Plan:   Principal Problem:   Severe sepsis with septic shock ([place]) Active Problems:   HTN (hypertension)   HIV (human immunodeficiency virus infection) ([place])   Diverticulitis of large intestine with abscess without bleeding   Abscess of sigmoid colon due to diverticulitis   Acute renal failure (ARF) ([place])   Hypotension   Diverticulosis   Acute respiratory distress   DNR (do not resuscitate) discussion   Palliative care by specialist   Dyspnea   Goals of care, counseling/discussion   Pressure injury of skin   Nutrition Problem: Increased nutrient needs Etiology: chronic illness (COPD, new HD)  Signs/Symptoms: estimated needs   Body mass index is 44.24 kg/m.   (Morbid obesity)  Septic shock - resolved  Acute sigmoid diverticulitis and diverticular abscess, significant leukocytosis- s/p CT-guided drainage by IR on 04/14/2020.  Fluid culture showed Klebsiella pneumonia, pseudomonas aeruginosa and E. Coli.  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  No acute abnormality on CT head on 05/02/2020  History of stroke    PLAN  S/p successful upsizing and repositioning of indwelling lower abdominal drain from 12 [place] to 14 [place] and 05/15/2020.  There is concern for suspected perforation of diverticulum.  Continue IV antimicrobial therapy (of note, patient had IV meropenem from 04/15/2020 to 05/06/2020 and meropenem was restarted on 05/09/2020.  She also had a anidulafungin from 04/15/2020 to 04/22/2020 and this was restarted on 05/02/2020)  S/p transfusion with 1 unit of PRBCs on 05/01/2020, 05/04/2020, 05/09/2020 and 05/15/2020. Monitor H&H closely.  Potassium level is better.  Continue to monitor. Continue amiodarone for A. fib Continue antiretroviral therapy Continue bronchodilators Follow-up with nephrologist for hemodialysis. Follow-up with ID, general surgery and IR for further recommendations. Prognosis is poor.    Diet Order            DIET DYS 3 Room service appropriate? Yes; Fluid consistency: Thin  Diet effective now  Consultants:  General surgeon  Cardiologist  Nephrologist  Vascular surgeon  Interventional radiologist  Procedures:  CT-guided drainage of diverticular abscess on 04/14/2020    Medications:   . sodium  chloride   Intravenous Once  . acidophilus  1 capsule Oral Daily  . alteplase  2 mg Intracatheter Once  . alteplase  2 mg Intracatheter Once  . amiodarone  200 mg Oral BID  . [START ON 05/19/2020] Chlorhexidine Gluconate Cloth  6 each Topical Daily  . rilpivirine  25 mg Oral Q breakfast   And  . dolutegravir  50 mg Oral Q breakfast  . dronabinol  5 mg Oral BID AC  . epoetin (EPOGEN/PROCRIT) injection  10,000 Units Intravenous Q M,W,F-HD  . famotidine  10 mg Oral Q1200  . feeding supplement (ENSURE ENLIVE)  237 mL Oral TID BM  . guaiFENesin-dextromethorphan  10 mL Oral Q8H  . heparin injection (subcutaneous)  5,000 Units Subcutaneous Q8H  . magnesium oxide  400 mg Oral Daily  . mometasone-formoterol  2 puff Inhalation BID  . multivitamin  1 tablet Oral QHS  . multivitamin with minerals  1 tablet Oral Daily  . sodium chloride flush  5 mL Intracatheter Q8H  . torsemide  40 mg Oral Daily   Continuous Infusions: . sodium chloride 250 mL (05/03/20 0043)  . sodium chloride Stopped (05/12/20 0620)  . anidulafungin Stopped (05/18/20 0550)  . meropenem (MERREM) IV Stopped (05/18/20 0325)     Anti-infectives (From admission, onward)   Start     Dose/Rate Route Frequency Ordered Stop   05/10/20 2200  meropenem (MERREM) 500 mg in sodium chloride 0.9 % 100 mL IVPB  Status:  Discontinued        500 mg 200 mL/hr over 30 Minutes Intravenous Every 12 hours 05/10/20 1324 05/10/20 1328   05/10/20 2200  meropenem (MERREM) 500 mg in sodium chloride 0.9 % 100 mL IVPB        500 mg 200 mL/hr over 30 Minutes Intravenous Daily 05/10/20 1328     05/09/20 2200  meropenem (MERREM) 1 g in sodium chloride 0.9 % 100 mL IVPB  Status:  Discontinued        1 g 200 mL/hr over 30 Minutes Intravenous Every 12 hours 05/09/20 2121 05/10/20 1324   05/07/20 1300  rilpivirine (EDURANT) tablet 25 mg       "And" Linked Group Details   25 mg Oral Daily with breakfast 05/07/20 1245     05/07/20 1300  dolutegravir  (TIVICAY) tablet 50 mg       "And" Linked Group Details   50 mg Oral Daily with breakfast 05/07/20 1245     05/06/20 0956  meropenem (MERREM) 500 mg in sodium chloride 0.9 % 100 mL IVPB  Status:  Discontinued        500 mg 200 mL/hr over 30 Minutes Intravenous Every 24 hours 05/05/20 1018 05/05/20 1637   05/03/20 2000  anidulafungin (ERAXIS) 100 mg in sodium chloride 0.9 % 100 mL IVPB        100 mg 78 mL/hr over 100 Minutes Intravenous Every 24 hours 05/02/20 2223     05/02/20 2330  anidulafungin (ERAXIS) 200 mg in sodium chloride 0.9 % 200 mL IVPB        200 mg 78 mL/hr over 200 Minutes Intravenous  Once 05/02/20 2223 05/03/20 0455   04/30/20 0800  rilpivirine (EDURANT) tablet 25 mg  Status:  Discontinued       "And" Linked Group Details  25 mg Oral Daily with breakfast 04/29/20 1254 05/02/20 2223   04/30/20 0800  dolutegravir (TIVICAY) tablet 50 mg  Status:  Discontinued       "And" Linked Group Details   50 mg Oral Daily with breakfast 04/29/20 1254 05/02/20 2223   04/28/20 2200  meropenem (MERREM) 500 mg in sodium chloride 0.9 % 100 mL IVPB  Status:  Discontinued        500 mg 200 mL/hr over 30 Minutes Intravenous Every 12 hours 04/28/20 1546 05/05/20 1018   04/26/20 2200  meropenem (MERREM) 500 mg in sodium chloride 0.9 % 100 mL IVPB  Status:  Discontinued        500 mg 200 mL/hr over 30 Minutes Intravenous Every 8 hours 04/26/20 1258 04/28/20 1546   04/25/20 1700  rilpivirine (EDURANT) tablet 25 mg  Status:  Discontinued        25 mg Oral Daily with breakfast 04/24/20 1636 04/25/20 1111   04/25/20 1200  rilpivirine (EDURANT) tablet 25 mg  Status:  Discontinued        25 mg Oral Daily with breakfast 04/25/20 1111 04/29/20 1254   04/25/20 1000  meropenem (MERREM) 500 mg in sodium chloride 0.9 % 100 mL IVPB  Status:  Discontinued        500 mg 200 mL/hr over 30 Minutes Intravenous Every 12 hours 04/25/20 0935 04/26/20 1258   04/24/20 1700  darunavir-cobicistat (PREZCOBIX) 800-150  MG per tablet 1 tablet  Status:  Discontinued        1 tablet Oral Daily with breakfast 04/24/20 1231 04/24/20 1636   04/24/20 1330  dolutegravir (TIVICAY) tablet 50 mg  Status:  Discontinued        50 mg Oral Daily 04/24/20 1231 04/29/20 1254   04/23/20 2200  meropenem (MERREM) 500 mg in sodium chloride 0.9 % 100 mL IVPB  Status:  Discontinued        500 mg 200 mL/hr over 30 Minutes Intravenous Every 24 hours 04/23/20 1127 04/25/20 0935   04/18/20 2000  levofloxacin (LEVAQUIN) IVPB 750 mg        750 mg 100 mL/hr over 90 Minutes Intravenous  Once 04/18/20 1857 04/19/20 0004   04/18/20 1800  levofloxacin (LEVAQUIN) IVPB 750 mg  Status:  Discontinued        750 mg 100 mL/hr over 90 Minutes Intravenous  Once 04/18/20 1640 04/18/20 1857   04/16/20 2200  anidulafungin (ERAXIS) 100 mg in sodium chloride 0.9 % 100 mL IVPB        100 mg 78 mL/hr over 100 Minutes Intravenous Every 24 hours 04/16/20 1707 04/22/20 0211   04/15/20 2200  valACYclovir (VALTREX) tablet 500 mg  Status:  Discontinued        500 mg Oral Daily at bedtime 04/15/20 1248 04/16/20 1259   04/15/20 2200  anidulafungin (ERAXIS) 200 mg in sodium chloride 0.9 % 200 mL IVPB        200 mg 78 mL/hr over 200 Minutes Intravenous  Once 04/15/20 2008 04/16/20 0327   04/15/20 2200  meropenem (MERREM) 1 g in sodium chloride 0.9 % 100 mL IVPB  Status:  Discontinued        1 g 200 mL/hr over 30 Minutes Intravenous Every 12 hours 04/15/20 2017 04/23/20 1127   04/15/20 1400  piperacillin-tazobactam (ZOSYN) IVPB 3.375 g  Status:  Discontinued        3.375 g 12.5 mL/hr over 240 Minutes Intravenous Every 8 hours 04/15/20 1300 04/15/20 2006   04/14/20  2200  elvitegravir-cobicistat-emtricitabine-tenofovir (GENVOYA) 150-150-200-10 MG tablet 1 tablet  Status:  Discontinued        1 tablet Oral Daily at bedtime 04/14/20 0235 04/15/20 0957   04/14/20 2200  valACYclovir (VALTREX) tablet 1,000 mg  Status:  Discontinued        1,000 mg Oral Daily at  bedtime 04/14/20 0235 04/15/20 1248   04/14/20 1230  cefTRIAXone (ROCEPHIN) 2 g in sodium chloride 0.9 % 100 mL IVPB  Status:  Discontinued        2 g 200 mL/hr over 30 Minutes Intravenous Every 24 hours 04/14/20 1223 04/15/20 1300   04/14/20 1230  metroNIDAZOLE (FLAGYL) IVPB 500 mg  Status:  Discontinued        500 mg 100 mL/hr over 60 Minutes Intravenous Every 8 hours 04/14/20 1223 04/15/20 1636   04/14/20 1030  piperacillin-tazobactam (ZOSYN) IVPB 3.375 g  Status:  Discontinued        3.375 g 12.5 mL/hr over 240 Minutes Intravenous Every 8 hours 04/14/20 0235 04/14/20 1223   04/14/20 0215  piperacillin-tazobactam (ZOSYN) IVPB 4.5 g  Status:  Discontinued        4.5 g 200 mL/hr over 30 Minutes Intravenous  Once 04/14/20 0200 04/14/20 0208   04/14/20 0215  piperacillin-tazobactam (ZOSYN) IVPB 3.375 g        3.375 g 100 mL/hr over 30 Minutes Intravenous  Once 04/14/20 0208 04/14/20 0301             Family Communication/Anticipated D/C date and plan/Code Status   DVT prophylaxis:      Code Status: Partial Code  Family Communication:  Disposition Plan:    Status is: Inpatient  Remains inpatient appropriate because:IV treatments appropriate due to intensity of illness or inability to take PO and Inpatient level of care appropriate due to severity of illness   Dispo: The patient is from: Home              Anticipated d/c is to: SNF              Anticipated d/c date is: > 3 days              Patient currently is not medically stable to d/c.           Subjective:   She complains of abdominal pain.  Objective:    Vitals:   05/17/20 1556 05/17/20 2151 05/18/20 0402 05/18/20 1255  BP: (!) 96/52 110/64 (!) 109/58 (!) 106/59  Pulse: 99 94 87 92  Resp: 20 20 20 20   Temp: 97.7 F (36.5 C) 98.2 F (36.8 C) 98 F (36.7 C) 98.6 F (37 C)  TempSrc:  Oral Oral Oral  SpO2: 100% 97% 98% 94%  Weight:      Height:       No data found.   Intake/Output Summary  (Last 24 hours) at 05/18/2020 1442 Last data filed at 05/18/2020 0909 Gross per 24 hour  Intake 796.9 ml  Output 240 ml  Net 556.9 ml   Filed Weights   05/03/20 0547 05/09/20 0951 05/14/20 1349  Weight: 130.9 kg 119 kg 124.3 kg    Exam:   GEN: NAD SKIN: Warm and dry.  Moisture associated skin damage to bilateral buttocks but lesions on the left buttock are healing. EYES: Pale, anicteric ENT: MMM CV: RRR PULM: CTA B ABD: soft, obese, lower abdominal tenderness but no rebound or guarding, NT, +JP drain with brownish fluid CNS: AAO x 2 (person and place), non  focal EXT: No edema or tenderness     Data Reviewed:   I have personally reviewed following labs and imaging studies:  Labs: Labs show the following:   Basic Metabolic Panel: Recent Labs  Lab 05/12/20 0744 05/13/20 0728 05/14/20 0604 05/14/20 0604 05/15/20 0443 05/15/20 0443 05/16/20 0440 05/16/20 0440 05/16/20 0443 05/17/20 0849 05/18/20 0649  NA 140   < > 138  --  137  --  140  --   --  138 139  K 3.8   < > 3.8   < > 3.0*   < > 3.0*   < >  --  3.4* 3.5  CL 101   < > 101  --  100  --  103  --   --  101 103  CO2 27   < > 25  --  27  --  26  --   --  26 28  GLUCOSE 72   < > 82  --  96  --  82  --   --  69* 79  BUN 52*   < > 39*  --  26*  --  30*  --   --  20 23*  CREATININE 3.66*   < > 2.91*  --  2.32*  --  2.46*  --   --  2.10* 2.01*  CALCIUM 7.5*   < > 7.4*  --  7.4*  --  7.4*  --   --  7.4* 7.3*  MG 2.0  --   --   --   --   --  1.9  --   --   --   --   PHOS  --   --   --   --   --   --   --   --  3.4  --   --    < > = values in this interval not displayed.   GFR Estimated Creatinine Clearance: 41.1 mL/min (A) (by C-G formula based on SCr of 2.01 mg/dL (H)). Liver Function Tests: Recent Labs  Lab 05/14/20 0604 05/16/20 0440  AST 25 25  ALT 23 24  ALKPHOS 78 81  BILITOT 1.3* 1.0  PROT 5.3* 5.3*  ALBUMIN 1.1* 1.1*   No results for input(s): LIPASE, AMYLASE in the last 168 hours. No results  for input(s): AMMONIA in the last 168 hours. Coagulation profile Recent Labs  Lab 05/15/20 0443  INR 1.2    CBC: Recent Labs  Lab 05/14/20 0604 05/15/20 0444 05/16/20 0440 05/17/20 0849 05/18/20 0649  WBC 19.9* 28.8* 29.5* 32.4* 32.0*  NEUTROABS  --   --  19.3* 22.3* 22.8*  HGB 7.4* 7.0* 8.0* 7.3* 7.9*  HCT 22.4* 22.6* 24.6* 23.2* 24.4*  MCV 91.4 93.4 91.4 92.1 91.4  PLT 381 338 283 228 251   Cardiac Enzymes: No results for input(s): CKTOTAL, CKMB, CKMBINDEX, TROPONINI in the last 168 hours. BNP (last 3 results) No results for input(s): PROBNP in the last 8760 hours. CBG: No results for input(s): GLUCAP in the last 168 hours. D-Dimer: No results for input(s): DDIMER in the last 72 hours. Hgb A1c: No results for input(s): HGBA1C in the last 72 hours. Lipid Profile: No results for input(s): CHOL, HDL, LDLCALC, TRIG, CHOLHDL, LDLDIRECT in the last 72 hours. Thyroid function studies: No results for input(s): TSH, T4TOTAL, T3FREE, THYROIDAB in the last 72 hours.  Invalid input(s): FREET3 Anemia work up: No results for input(s): VITAMINB12, FOLATE, FERRITIN, TIBC, IRON, RETICCTPCT in the  last 72 hours. Sepsis Labs: Recent Labs  Lab 05/15/20 0444 05/16/20 0440 05/17/20 0849 05/18/20 0649  WBC 28.8* 29.5* 32.4* 32.0*    Microbiology Recent Results (from the past 240 hour(s))  Aerobic/Anaerobic Culture (surgical/deep wound)     Status: None (Preliminary result)   Collection Time: 05/15/20  4:36 PM   Specimen: Abdomen; Abdominal Fluid  Result Value Ref Range Status   Specimen Description   Final    ABDOMEN Performed at [facility], [address], [place], [place] [address]    Special Requests   Final    ABDOMINAL FLUID Performed at [facility], [address] [place], [place] [address]    Gram Stain   Final    RARE WBC PRESENT, PREDOMINANTLY MONONUCLEAR NO ORGANISMS SEEN    Culture   Final    NO GROWTH 3 DAYS NO ANAEROBES ISOLATED;  CULTURE IN PROGRESS FOR 5 DAYS Performed at [facility] [address], [place], [place] [address]    Report Status PENDING  Incomplete  Body fluid culture     Status: None (Preliminary result)   Collection Time: 05/16/20  6:37 PM   Specimen: JP Drain; Body Fluid  Result Value Ref Range Status   Specimen Description   Final    JP DRAINAGE Performed at [facility], [address], [place], [place] [address]    Special Requests   Final    NONE Performed at [facility], [address] [place], [place] [address]    Gram Stain   Final    FEW WBC PRESENT,BOTH PMN AND MONONUCLEAR ABUNDANT GRAM POSITIVE COCCI MODERATE GRAM NEGATIVE RODS    Culture   Final    ABUNDANT PSEUDOMONAS AERUGINOSA SUSCEPTIBILITIES TO FOLLOW ABUNDANT ENTEROCOCCUS SPECIES CULTURE REINCUBATED FOR BETTER GROWTH Performed at [facility], [place] [address] [place], [place] [address]    Report Status PENDING  Incomplete    Procedures and diagnostic studies:  No results found.             LOS: 34 days   [place] [employer], advertising on [email or web address]. If 7PM-7AM, please contact night-coverage at [email or web address]     05/18/2020, 2:42 PM

## 2020-05-18 NOTE — Progress Notes (Addendum)
[facility]  ROUNDING NOTE   Subjective:   The patient reports some abdominal pain  Denies any shortness of breath, edema.  Patient reports that she is wanting to try to put her feet on the floor and stand up.   Objective:  Vital signs in last 24 hours:  Temp:  [97.7 F (36.5 C)-98.2 F (36.8 C)] 98 F (36.7 C) (09/19 0402) Pulse Rate:  [87-99] 87 (09/19 0402) Resp:  [20] 20 (09/19 0402) BP: (96-110)/(52-64) 109/58 (09/19 0402) SpO2:  [97 %-100 %] 98 % (09/19 0402)  Weight change:  Filed Weights   05/03/20 0547 05/09/20 0951 05/14/20 1349  Weight: 130.9 kg 119 kg 124.3 kg    Intake/Output: I/O last 3 completed shifts: In: 1032.7 [P.O.:480; Other:15; IV Piggyback:537.7] Out: 275 [Urine:200; Drains:75]   Intake/Output this shift:  Total I/O In: 101.9 [P.O.:30; IV Piggyback:71.9] Out: -   Physical Exam: General: NAD,   Head: Normocephalic, atraumatic. Moist oral mucosal membranes  Eyes: Anicteric, PERRL  Neck: Supple, trachea midline  Lungs:  Clear to auscultation  Heart: Regular rate and rhythm  Abdomen:  Soft, nontender   Extremities:  No peripheral edema.  Neurologic: Nonfocal, moving all four extremities  Skin: No lesions  Access: RIJ permcath     Basic Metabolic Panel: Recent Labs  Lab 05/12/20 0744 05/13/20 0728 05/14/20 0604 05/14/20 0604 05/15/20 0443 05/15/20 0443 05/16/20 0440 05/16/20 0443 05/17/20 0849 05/18/20 0649  NA 140   < > 138  --  137  --  140  --  138 139  K 3.8   < > 3.8  --  3.0*  --  3.0*  --  3.4* 3.5  CL 101   < > 101  --  100  --  103  --  101 103  CO2 27   < > 25  --  27  --  26  --  26 28  GLUCOSE 72   < > 82  --  96  --  82  --  69* 79  BUN 52*   < > 39*  --  26*  --  30*  --  20 23*  CREATININE 3.66*   < > 2.91*  --  2.32*  --  2.46*  --  2.10* 2.01*  CALCIUM 7.5*   < > 7.4*   < > 7.4*   < > 7.4*  --  7.4* 7.3*  MG 2.0  --   --   --   --   --  1.9  --   --   --   PHOS  --   --   --   --   --   --   --   3.4  --   --    < > = values in this interval not displayed.    Liver Function Tests: Recent Labs  Lab 05/14/20 0604 05/16/20 0440  AST 25 25  ALT 23 24  ALKPHOS 78 81  BILITOT 1.3* 1.0  PROT 5.3* 5.3*  ALBUMIN 1.1* 1.1*   No results for input(s): LIPASE, AMYLASE in the last 168 hours. No results for input(s): AMMONIA in the last 168 hours.  CBC: Recent Labs  Lab 05/14/20 0604 05/15/20 0444 05/16/20 0440 05/17/20 0849 05/18/20 0649  WBC 19.9* 28.8* 29.5* 32.4* 32.0*  NEUTROABS  --   --  19.3* 22.3* 22.8*  HGB 7.4* 7.0* 8.0* 7.3* 7.9*  HCT 22.4* 22.6* 24.6* 23.2* 24.4*  MCV 91.4 93.4 91.4  92.1 91.4  PLT 381 338 283 228 251    Cardiac Enzymes: No results for input(s): CKTOTAL, CKMB, CKMBINDEX, TROPONINI in the last 168 hours.  BNP: Invalid input(s): POCBNP  CBG: No results for input(s): GLUCAP in the last 168 hours.  Microbiology: Results for orders placed or performed during the hospital encounter of 04/14/20  SARS Coronavirus 2 by RT PCR (hospital order, performed in [facility] hospital lab) Nasopharyngeal Nasopharyngeal Swab     Status: None   Collection Time: 04/14/20  2:17 AM   Specimen: Nasopharyngeal Swab  Result Value Ref Range Status   SARS Coronavirus 2 NEGATIVE NEGATIVE Final    Comment: (NOTE) SARS-CoV-2 target nucleic acids are NOT DETECTED.  The SARS-CoV-2 RNA is generally detectable in upper and lower respiratory specimens during the acute phase of infection. The lowest concentration of SARS-CoV-2 viral copies this assay can detect is 250 copies / mL. A negative result does not preclude SARS-CoV-2 infection and should not be used as the sole basis for treatment or other patient management decisions.  A negative result may occur with improper specimen collection / handling, submission of specimen other than nasopharyngeal swab, presence of viral mutation(s) within the areas targeted by this assay, and inadequate number of viral copies (<250  copies / mL). A negative result must be combined with clinical observations, patient history, and epidemiological information.  Fact Sheet for Patients:   StrictlyIdeas.no  Fact Sheet for Healthcare Providers: BankingDealers.co.za  This test is not yet approved or  cleared by the [place] FDA and has been authorized for detection and/or diagnosis of SARS-CoV-2 by FDA under an Emergency Use Authorization (EUA).  This EUA will remain in effect (meaning this test can be used) for the duration of the COVID-19 declaration under Section 564(b)(1) of the Act, 21 U.S.C. section 360bbb-3(b)(1), unless the authorization is terminated or revoked sooner.  Performed at [facility], [address] [place], [place] [address]   CULTURE, BLOOD (ROUTINE X 2) w Reflex to ID Panel     Status: None   Collection Time: 04/14/20  8:14 AM   Specimen: BLOOD  Result Value Ref Range Status   Specimen Description BLOOD LEFT [facility]  Final   Special Requests   Final    BOTTLES DRAWN AEROBIC AND ANAEROBIC Blood Culture adequate volume   Culture   Final    NO GROWTH 5 DAYS Performed at [facility], [address] [place], [place] [address]    Report Status 04/19/2020 FINAL  Final  CULTURE, BLOOD (ROUTINE X 2) w Reflex to ID Panel     Status: None   Collection Time: 04/14/20  8:14 AM   Specimen: BLOOD  Result Value Ref Range Status   Specimen Description BLOOD LEFT WRIST  Final   Special Requests   Final    BOTTLES DRAWN AEROBIC AND ANAEROBIC Blood Culture adequate volume   Culture   Final    NO GROWTH 5 DAYS Performed at [facility], [address], [place], [place] [address]    Report Status 04/19/2020 FINAL  Final  Aerobic/Anaerobic Culture (surgical/deep wound)     Status: None   Collection Time: 04/14/20 11:14 AM   Specimen: Abscess  Result Value Ref Range Status   Specimen Description   Final    ABSCESS Performed  at [facility], [address] [place], [place] [address]    Special Requests   Final    NONE Performed at [facility], [address] [place], [place] [address]    Gram Stain   Final    NO WBC SEEN ABUNDANT GRAM NEGATIVE RODS FEW GRAM POSITIVE COCCI FEW GRAM POSITIVE RODS Performed at [facility], [place] [address], [place], [place] [address]    Culture   Final    ABUNDANT KLEBSIELLA PNEUMONIAE ABUNDANT PSEUDOMONAS AERUGINOSA MODERATE ESCHERICHIA COLI Confirmed Extended Spectrum Beta-Lactamase Producer (ESBL).  In bloodstream infections from ESBL organisms, carbapenems are preferred over piperacillin/tazobactam. They are shown to have a lower risk of mortality. MIXED ANAEROBIC FLORA PRESENT.  CALL LAB IF FURTHER IID REQUIRED.    Report Status 04/20/2020 FINAL  Final   Organism ID, Bacteria KLEBSIELLA PNEUMONIAE  Final   Organism ID, Bacteria PSEUDOMONAS AERUGINOSA  Final   Organism ID, Bacteria ESCHERICHIA COLI  Final      Susceptibility   Escherichia coli - MIC*    AMPICILLIN >=32 RESISTANT Resistant     CEFAZOLIN >=64 RESISTANT Resistant     CEFEPIME 16 RESISTANT Resistant     CEFTAZIDIME RESISTANT Resistant     CEFTRIAXONE >=64 RESISTANT Resistant     CIPROFLOXACIN >=4 RESISTANT Resistant     GENTAMICIN <=1 SENSITIVE Sensitive     IMIPENEM <=0.25 SENSITIVE Sensitive     TRIMETH/SULFA >=320 RESISTANT Resistant     AMPICILLIN/SULBACTAM >=32 RESISTANT Resistant     PIP/TAZO 8 SENSITIVE Sensitive     * MODERATE ESCHERICHIA COLI   Klebsiella pneumoniae - MIC*    AMPICILLIN >=32 RESISTANT Resistant     CEFAZOLIN <=4 SENSITIVE Sensitive     CEFEPIME <=0.12 SENSITIVE Sensitive     CEFTAZIDIME <=1 SENSITIVE Sensitive     CEFTRIAXONE <=0.25 SENSITIVE Sensitive     CIPROFLOXACIN <=0.25 SENSITIVE Sensitive     GENTAMICIN <=1 SENSITIVE Sensitive     IMIPENEM <=0.25 SENSITIVE Sensitive     TRIMETH/SULFA <=20 SENSITIVE Sensitive      AMPICILLIN/SULBACTAM 16 INTERMEDIATE Intermediate     PIP/TAZO 8 SENSITIVE Sensitive     * ABUNDANT KLEBSIELLA PNEUMONIAE   Pseudomonas aeruginosa - MIC*    CEFTAZIDIME 4 SENSITIVE Sensitive     CIPROFLOXACIN <=0.25 SENSITIVE Sensitive     GENTAMICIN 2 SENSITIVE Sensitive     IMIPENEM 2 SENSITIVE Sensitive     PIP/TAZO 8 SENSITIVE Sensitive     CEFEPIME 4 SENSITIVE Sensitive     * ABUNDANT PSEUDOMONAS AERUGINOSA  MRSA PCR Screening     Status: None   Collection Time: 04/14/20  6:19 PM   Specimen: Nasopharyngeal  Result Value Ref Range Status   MRSA by PCR NEGATIVE NEGATIVE Final    Comment:        The GeneXpert MRSA Assay (FDA approved for NASAL specimens only), is one component of a comprehensive MRSA colonization surveillance program. It is not intended to diagnose MRSA infection nor to guide or monitor treatment for MRSA infections. Performed at [facility], [address] [place], [place] [address]   Urine Culture     Status: None   Collection Time: 04/15/20  4:28 AM   Specimen: Urine, Random  Result Value Ref Range Status   Specimen Description   Final    URINE, RANDOM Performed at [facility], [address], [place], [place] [address]    Special Requests   Final    NONE Performed at [facility], [address], [place], [place] [address]    Culture   Final    NO GROWTH Performed at [facility], [place]  [address] [place], [place] [address]    Report Status 04/16/2020 FINAL  Final  CULTURE, BLOOD (ROUTINE X 2) w Reflex to ID Panel     Status: None   Collection Time: 04/22/20  6:12 PM   Specimen: BLOOD  Result Value Ref Range Status   Specimen Description BLOOD FOOT  Final   Special Requests   Final    BOTTLES DRAWN AEROBIC AND ANAEROBIC Blood Culture adequate volume   Culture   Final    NO GROWTH 5 DAYS Performed at [facility], [address] [place], [place] [address]    Report Status 04/27/2020 FINAL   Final  CULTURE, BLOOD (ROUTINE X 2) w Reflex to ID Panel     Status: None   Collection Time: 04/22/20  6:17 PM   Specimen: BLOOD  Result Value Ref Range Status   Specimen Description BLOOD FOOT  Final   Special Requests   Final    BOTTLES DRAWN AEROBIC AND ANAEROBIC Blood Culture results may not be optimal due to an excessive volume of blood received in culture bottles   Culture   Final    NO GROWTH 5 DAYS Performed at [facility], [address] [place], [place] [address]    Report Status 04/27/2020 FINAL  Final  CULTURE, BLOOD (ROUTINE X 2) w Reflex to ID Panel     Status: None   Collection Time: 05/01/20  2:50 PM   Specimen: BLOOD  Result Value Ref Range Status   Specimen Description BLOOD BLOOD LEFT HAND  Final   Special Requests   Final    BOTTLES DRAWN AEROBIC AND ANAEROBIC Blood Culture adequate volume   Culture   Final    NO GROWTH 5 DAYS Performed at [facility], [address] [place], [place] [address]    Report Status 05/06/2020 FINAL  Final  CULTURE, BLOOD (ROUTINE X 2) w Reflex to ID Panel     Status: None   Collection Time: 05/01/20  2:51 PM   Specimen: BLOOD  Result Value Ref Range Status   Specimen Description BLOOD BLOOD RIGHT HAND  Final   Special Requests   Final    BOTTLES DRAWN AEROBIC AND ANAEROBIC Blood Culture adequate volume   Culture   Final    NO GROWTH 5 DAYS Performed at [facility], [address] [place], [place] [address]    Report Status 05/06/2020 FINAL  Final  Aerobic/Anaerobic Culture (surgical/deep wound)     Status: None (Preliminary result)   Collection Time: 05/15/20  4:36 PM   Specimen: Abdomen; Abdominal Fluid  Result Value Ref Range Status   Specimen Description   Final    ABDOMEN Performed at [facility], [address] [place], [place] [address]    Special Requests   Final    ABDOMINAL FLUID Performed at [facility], [address] [place], [place] [address]     Gram Stain   Final    RARE WBC PRESENT, PREDOMINANTLY MONONUCLEAR NO ORGANISMS SEEN    Culture   Final    NO GROWTH 3 DAYS NO ANAEROBES ISOLATED; CULTURE IN PROGRESS FOR 5 DAYS Performed at [place] [address] [place], [place] [address]    Report Status PENDING  Incomplete  Body fluid culture     Status: None (Preliminary result)   Collection Time: 05/16/20  6:37 PM   Specimen: JP Drain; Body Fluid  Result Value Ref Range Status   Specimen Description  Final    JP DRAINAGE Performed at [facility], [address], [place], [place] [address]    Special Requests   Final    NONE Performed at [facility], [address] [place], [place] [address]    Gram Stain   Final    FEW WBC PRESENT,BOTH PMN AND MONONUCLEAR ABUNDANT GRAM POSITIVE COCCI MODERATE GRAM NEGATIVE RODS    Culture   Final    FEW PSEUDOMONAS AERUGINOSA SUSCEPTIBILITIES TO FOLLOW CULTURE REINCUBATED FOR BETTER GROWTH Performed at [facility], [place] [address] [place], [place] [address]    Report Status PENDING  Incomplete    Coagulation Studies: No results for input(s): LABPROT, INR in the last 72 hours.  Urinalysis: No results for input(s): COLORURINE, LABSPEC, PHURINE, GLUCOSEU, HGBUR, BILIRUBINUR, KETONESUR, PROTEINUR, UROBILINOGEN, NITRITE, LEUKOCYTESUR in the last 72 hours.  Invalid input(s): APPERANCEUR    Imaging: No results found.   Medications:   . sodium chloride 250 mL (05/03/20 0043)  . sodium chloride Stopped (05/12/20 0620)  . anidulafungin Stopped (05/18/20 0550)  . meropenem (MERREM) IV Stopped (05/18/20 0325)   . sodium chloride   Intravenous Once  . acidophilus  1 capsule Oral Daily  . alteplase  2 mg Intracatheter Once  . alteplase  2 mg Intracatheter Once  . amiodarone  200 mg Oral BID  . [START ON 05/19/2020] Chlorhexidine Gluconate Cloth  6 each Topical Daily  . rilpivirine  25 mg Oral Q breakfast   And  . dolutegravir  50 mg Oral  Q breakfast  . dronabinol  5 mg Oral BID AC  . epoetin (EPOGEN/PROCRIT) injection  10,000 Units Intravenous Q M,W,F-HD  . famotidine  10 mg Oral Q1200  . feeding supplement (ENSURE ENLIVE)  237 mL Oral TID BM  . guaiFENesin-dextromethorphan  10 mL Oral Q8H  . heparin injection (subcutaneous)  5,000 Units Subcutaneous Q8H  . magnesium oxide  400 mg Oral Daily  . mometasone-formoterol  2 puff Inhalation BID  . multivitamin  1 tablet Oral QHS  . multivitamin with minerals  1 tablet Oral Daily  . sodium chloride flush  5 mL Intracatheter Q8H  . torsemide  40 mg Oral Daily   sodium chloride, acetaminophen **OR** acetaminophen, albuterol, ALPRAZolam, bisacodyl, diphenhydrAMINE, docusate sodium, fentaNYL, heparin NICU/SCN flush, HYDROmorphone (DILAUDID) injection, iohexol, ipratropium-albuterol, magic mouthwash **AND** lidocaine, magnesium hydroxide, menthol-cetylpyridinium, metoprolol tartrate, oxyCODONE-acetaminophen, phenol, simethicone  Assessment/ Plan:  Melanie Bowen is a 59 y.o.  female with HIV, COPD, HTN , GERD, who is admitted for Dyspnea and  colonic diverticular abcess.   1. AKI requiring hemodialysis.  - patient currently on MWF schedule. No indication for dialysis at this time. May consider holding dialysis tomorrow as well, pending labs in the AM.   - creatinine has improved to 2.01 and eGFR 27   2. Anemia of CKD  - patient has required transfusions and received 10,000 units epogen with dialysis - hgb 7.9, trending up  3. Hypotension  - bp 109/58  4. Hypokalemia  - potassium improved slightly to 3.5.     LOS: [place] 9/19/202112:41 PM  Patient was seen and examined with [facility]. Hold dialysis and monitor renal function. Above plan was discussed and agreed upon on the signing of this note.   Lavonia Dana, [facility]  9/19/20212:13 PM

## 2020-05-18 NOTE — TOC Progression Note (Signed)
Transition of Care [facility]) - Progression Note    Patient Details  Name: Melanie Bowen MRN: [id number] Date of Birth: 12/14/1960  Transition [facility] CM/SW Contact  Zigmund Daniel Dorian Pod, RN Phone Number: 05/18/2020, 12:47 PM  Clinical Narrative:    Pt continues to be unstable for discharge. PASRR# [id number] A. Selected [facility].  TOC will continue to follow.       Expected Discharge Plan and Services                                                 Social Determinants of Health (SDOH) Interventions    Readmission Risk Interventions Readmission Risk Prevention Plan 11/08/2019  Transportation Screening Complete  Medication Review [employer]) Complete  PCP or Specialist appointment within 3-5 days of discharge Not Complete  PCP/Specialist Appt Not Complete comments Her PCP is out for 2-3 weeks so other two providers are booked out. The receptionist will call her if there is a cancellation.  [place] or Home Care Consult Complete  SW Recovery Care/Counseling Consult Complete  Palliative Care Screening Not Applicable  Skilled Nursing Facility Not Applicable  Some recent data might be hidden

## 2020-05-18 NOTE — Progress Notes (Signed)
PHARMACY NOTE:  ANTIMICROBIAL RENAL DOSAGE ADJUSTMENT  Current antimicrobial regimen includes a mismatch between antimicrobial dosage and estimated renal function.  As per policy approved by the Pharmacy & Therapeutics and Medical Executive Committees, the antimicrobial dosage will be adjusted accordingly.  Current antimicrobial dosage:  Meropenem 500 mg IV q24h  Indication: IAI  Renal Function:  Estimated Creatinine Clearance: 41.1 mL/min (A) (by C-G formula based on SCr of 2.01 mg/dL (H)). Was on HD but holding off for now as renal function may be recovering, discussed with nephrologist. Will dose off of CrCl for now as pt with ESBL and pseudomonas growing.     Antimicrobial dosage has been changed to:  Meropenem 1 g IV q12h     Thank you for allowing pharmacy to be a part of this patient's care.  Rocky Morel, [facility] 05/18/2020 4:57 PM

## 2020-05-19 DIAGNOSIS — A419 Sepsis, unspecified organism: Secondary | ICD-10-CM | POA: Diagnosis not present

## 2020-05-19 DIAGNOSIS — R509 Fever, unspecified: Secondary | ICD-10-CM

## 2020-05-19 DIAGNOSIS — K5720 Diverticulitis of large intestine with perforation and abscess without bleeding: Secondary | ICD-10-CM | POA: Diagnosis not present

## 2020-05-19 DIAGNOSIS — G9341 Metabolic encephalopathy: Secondary | ICD-10-CM | POA: Diagnosis not present

## 2020-05-19 DIAGNOSIS — R6521 Severe sepsis with septic shock: Secondary | ICD-10-CM | POA: Diagnosis not present

## 2020-05-19 LAB — CBC WITH DIFFERENTIAL/PLATELET
Abs Immature Granulocytes: 2.53 10*3/uL — ABNORMAL HIGH (ref 0.00–0.07)
Basophils Absolute: 0.2 10*3/uL — ABNORMAL HIGH (ref 0.0–0.1)
Basophils Relative: 1 %
Eosinophils Absolute: 0.1 10*3/uL (ref 0.0–0.5)
Eosinophils Relative: 0 %
HCT: 23 % — ABNORMAL LOW (ref 36.0–46.0)
Hemoglobin: 7.6 g/dL — ABNORMAL LOW (ref 12.0–15.0)
Immature Granulocytes: 9 %
Lymphocytes Relative: 8 %
Lymphs Abs: 2.3 10*3/uL (ref 0.7–4.0)
MCH: 29.7 pg (ref 26.0–34.0)
MCHC: 33 g/dL (ref 30.0–36.0)
MCV: 89.8 fL (ref 80.0–100.0)
Monocytes Absolute: 2.7 10*3/uL — ABNORMAL HIGH (ref 0.1–1.0)
Monocytes Relative: 10 %
Neutro Abs: 19.9 10*3/uL — ABNORMAL HIGH (ref 1.7–7.7)
Neutrophils Relative %: 72 %
Platelets: 241 10*3/uL (ref 150–400)
RBC: 2.56 MIL/uL — ABNORMAL LOW (ref 3.87–5.11)
RDW: 19 % — ABNORMAL HIGH (ref 11.5–15.5)
Smear Review: NORMAL
WBC: 27.6 10*3/uL — ABNORMAL HIGH (ref 4.0–10.5)
nRBC: 0.5 % — ABNORMAL HIGH (ref 0.0–0.2)

## 2020-05-19 LAB — BASIC METABOLIC PANEL
Anion gap: 9 (ref 5–15)
BUN: 25 mg/dL — ABNORMAL HIGH (ref 6–20)
CO2: 28 mmol/L (ref 22–32)
Calcium: 7.2 mg/dL — ABNORMAL LOW (ref 8.9–10.3)
Chloride: 101 mmol/L (ref 98–111)
Creatinine, Ser: 1.97 mg/dL — ABNORMAL HIGH (ref 0.44–1.00)
GFR calc Af Amer: 32 mL/min — ABNORMAL LOW (ref >60)
GFR calc non Af Amer: 27 mL/min — ABNORMAL LOW (ref >60)
Glucose, Bld: 84 mg/dL (ref 70–99)
Potassium: 3.3 mmol/L — ABNORMAL LOW (ref 3.5–5.1)
Sodium: 138 mmol/L (ref 135–145)

## 2020-05-19 MED ORDER — PIPERACILLIN-TAZOBACTAM 3.375 G IVPB
3.3750 g | Freq: Three times a day (TID) | INTRAVENOUS | Status: DC
  Administered 2020-05-19 – 2020-05-21 (×6): 3.375 g via INTRAVENOUS
  Filled 2020-05-19 (×6): qty 50

## 2020-05-19 MED ORDER — DEXTROMETHORPHAN-GUAIFENESIN 10-100 MG/5ML PO LIQD
10.0000 mL | Freq: Three times a day (TID) | ORAL | Status: DC
  Filled 2020-05-19: qty 10

## 2020-05-19 MED ORDER — AMIODARONE HCL 200 MG PO TABS
200.0000 mg | ORAL_TABLET | Freq: Every day | ORAL | Status: DC
  Administered 2020-05-20 – 2020-05-22 (×3): 200 mg via ORAL
  Filled 2020-05-19 (×3): qty 1

## 2020-05-19 NOTE — Progress Notes (Addendum)
[facility]  ROUNDING NOTE   Subjective:   The patient found resting in bed, alert,oriented, and pleasant.Sister at the bedside. No acute distress noted. Objective:  Vital signs in last 24 hours:  Temp:  [98.1 F (36.7 C)-99.6 F (37.6 C)] 99.6 F (37.6 C) (09/20 1400) Pulse Rate:  [74-96] 80 (09/20 1400) Resp:  [18-20] 18 (09/20 1400) BP: (102-125)/(48-63) 125/54 (09/20 1400) SpO2:  [91 %-100 %] 91 % (09/20 1400)  Weight change:  Filed Weights   05/03/20 0547 05/09/20 0951 05/14/20 1349  Weight: 130.9 kg 119 kg 124.3 kg    Intake/Output: I/O last 3 completed shifts: In: 891.9 [P.O.:510; Other:10; IV Piggyback:371.9] Out: 220 [Urine:200; Drains:20]   Intake/Output this shift:  Total I/O In: -  Out: 630 [Urine:600; Drains:30]  Physical Exam: General: Resting in bed, appears comfotable  Head: Normocephalic, atraumatic. Moist oral mucosal membranes  Eyes: Anicteric, PERRL  Neck: Supple, trachea midline  Lungs:  Diminished at the bases, Normal and symmetrical respiratory effort  Heart: S1S2 + Regular  Abdomen:  Non distended, non tender  Extremities:  No peripheral edema.  Neurologic: Alert, oriented, speech clear and appropriate  Skin: No acute lesions or rashes  Access: RIJ permcath     Basic Metabolic Panel: Recent Labs  Lab 05/15/20 0443 05/15/20 0443 05/16/20 0440 05/16/20 0440 05/16/20 0443 05/17/20 0849 05/18/20 0649 05/19/20 0653  NA 137  --  140  --   --  138 139 138  K 3.0*  --  3.0*  --   --  3.4* 3.5 3.3*  CL 100  --  103  --   --  101 103 101  CO2 27  --  26  --   --  26 28 28   GLUCOSE 96  --  82  --   --  69* 79 84  BUN 26*  --  30*  --   --  20 23* 25*  CREATININE 2.32*  --  2.46*  --   --  2.10* 2.01* 1.97*  CALCIUM 7.4*   < > 7.4*   < >  --  7.4* 7.3* 7.2*  MG  --   --  1.9  --   --   --   --   --   PHOS  --   --   --   --  3.4  --   --   --    < > = values in this interval not displayed.    Liver Function  Tests: Recent Labs  Lab 05/14/20 0604 05/16/20 0440  AST 25 25  ALT 23 24  ALKPHOS 78 81  BILITOT 1.3* 1.0  PROT 5.3* 5.3*  ALBUMIN 1.1* 1.1*   No results for input(s): LIPASE, AMYLASE in the last 168 hours. No results for input(s): AMMONIA in the last 168 hours.  CBC: Recent Labs  Lab 05/15/20 0444 05/16/20 0440 05/17/20 0849 05/18/20 0649 05/19/20 0653  WBC 28.8* 29.5* 32.4* 32.0* 27.6*  NEUTROABS  --  19.3* 22.3* 22.8* 19.9*  HGB 7.0* 8.0* 7.3* 7.9* 7.6*  HCT 22.6* 24.6* 23.2* 24.4* 23.0*  MCV 93.4 91.4 92.1 91.4 89.8  PLT 338 283 228 251 241    Cardiac Enzymes: No results for input(s): CKTOTAL, CKMB, CKMBINDEX, TROPONINI in the last 168 hours.  BNP: Invalid input(s): POCBNP  CBG: No results for input(s): GLUCAP in the last 168 hours.  Microbiology: Results for orders placed or performed during the hospital encounter of 04/14/20  SARS Coronavirus 2  by RT PCR (hospital order, performed in [facility] hospital lab) Nasopharyngeal Nasopharyngeal Swab     Status: None   Collection Time: 04/14/20  2:17 AM   Specimen: Nasopharyngeal Swab  Result Value Ref Range Status   SARS Coronavirus 2 NEGATIVE NEGATIVE Final    Comment: (NOTE) SARS-CoV-2 target nucleic acids are NOT DETECTED.  The SARS-CoV-2 RNA is generally detectable in upper and lower respiratory specimens during the acute phase of infection. The lowest concentration of SARS-CoV-2 viral copies this assay can detect is 250 copies / mL. A negative result does not preclude SARS-CoV-2 infection and should not be used as the sole basis for treatment or other patient management decisions.  A negative result may occur with improper specimen collection / handling, submission of specimen other than nasopharyngeal swab, presence of viral mutation(s) within the areas targeted by this assay, and inadequate number of viral copies (<250 copies / mL). A negative result must be combined with clinical observations,  patient history, and epidemiological information.  Fact Sheet for Patients:   StrictlyIdeas.no  Fact Sheet for Healthcare Providers: BankingDealers.co.za  This test is not yet approved or  cleared by the [place] FDA and has been authorized for detection and/or diagnosis of SARS-CoV-2 by FDA under an Emergency Use Authorization (EUA).  This EUA will remain in effect (meaning this test can be used) for the duration of the COVID-19 declaration under Section 564(b)(1) of the Act, 21 U.S.C. section 360bbb-3(b)(1), unless the authorization is terminated or revoked sooner.  Performed at [facility], [address] [place], [place] [address]   CULTURE, BLOOD (ROUTINE X 2) w Reflex to ID Panel     Status: None   Collection Time: 04/14/20  8:14 AM   Specimen: BLOOD  Result Value Ref Range Status   Specimen Description BLOOD LEFT [facility]  Final   Special Requests   Final    BOTTLES DRAWN AEROBIC AND ANAEROBIC Blood Culture adequate volume   Culture   Final    NO GROWTH 5 DAYS Performed at [facility], [address] [place], [place] [address]    Report Status 04/19/2020 FINAL  Final  CULTURE, BLOOD (ROUTINE X 2) w Reflex to ID Panel     Status: None   Collection Time: 04/14/20  8:14 AM   Specimen: BLOOD  Result Value Ref Range Status   Specimen Description BLOOD LEFT WRIST  Final   Special Requests   Final    BOTTLES DRAWN AEROBIC AND ANAEROBIC Blood Culture adequate volume   Culture   Final    NO GROWTH 5 DAYS Performed at [facility], [address], [place], [place] [address]    Report Status 04/19/2020 FINAL  Final  Aerobic/Anaerobic Culture (surgical/deep wound)     Status: None   Collection Time: 04/14/20 11:14 AM   Specimen: Abscess  Result Value Ref Range Status   Specimen Description   Final    ABSCESS Performed at [facility], [address] [place], [place] [address]     Special Requests   Final    NONE Performed at [facility], [address], [place] [address]    Gram Stain   Final    NO WBC SEEN ABUNDANT GRAM NEGATIVE RODS FEW GRAM POSITIVE COCCI FEW GRAM POSITIVE RODS Performed at [facility], [place] [address] [place],  [address]    Culture   Final    ABUNDANT KLEBSIELLA PNEUMONIAE ABUNDANT PSEUDOMONAS AERUGINOSA MODERATE ESCHERICHIA  COLI Confirmed Extended Spectrum Beta-Lactamase Producer (ESBL).  In bloodstream infections from ESBL organisms, carbapenems are preferred over piperacillin/tazobactam. They are shown to have a lower risk of mortality. MIXED ANAEROBIC FLORA PRESENT.  CALL LAB IF FURTHER IID REQUIRED.    Report Status 04/20/2020 FINAL  Final   Organism ID, Bacteria KLEBSIELLA PNEUMONIAE  Final   Organism ID, Bacteria PSEUDOMONAS AERUGINOSA  Final   Organism ID, Bacteria ESCHERICHIA COLI  Final      Susceptibility   Escherichia coli - MIC*    AMPICILLIN >=32 RESISTANT Resistant     CEFAZOLIN >=64 RESISTANT Resistant     CEFEPIME 16 RESISTANT Resistant     CEFTAZIDIME RESISTANT Resistant     CEFTRIAXONE >=64 RESISTANT Resistant     CIPROFLOXACIN >=4 RESISTANT Resistant     GENTAMICIN <=1 SENSITIVE Sensitive     IMIPENEM <=0.25 SENSITIVE Sensitive     TRIMETH/SULFA >=320 RESISTANT Resistant     AMPICILLIN/SULBACTAM >=32 RESISTANT Resistant     PIP/TAZO 8 SENSITIVE Sensitive     * MODERATE ESCHERICHIA COLI   Klebsiella pneumoniae - MIC*    AMPICILLIN >=32 RESISTANT Resistant     CEFAZOLIN <=4 SENSITIVE Sensitive     CEFEPIME <=0.12 SENSITIVE Sensitive     CEFTAZIDIME <=1 SENSITIVE Sensitive     CEFTRIAXONE <=0.25 SENSITIVE Sensitive     CIPROFLOXACIN <=0.25 SENSITIVE Sensitive     GENTAMICIN <=1 SENSITIVE Sensitive     IMIPENEM <=0.25 SENSITIVE Sensitive     TRIMETH/SULFA <=20 SENSITIVE Sensitive     AMPICILLIN/SULBACTAM 16 INTERMEDIATE Intermediate     PIP/TAZO 8 SENSITIVE Sensitive     *  ABUNDANT KLEBSIELLA PNEUMONIAE   Pseudomonas aeruginosa - MIC*    CEFTAZIDIME 4 SENSITIVE Sensitive     CIPROFLOXACIN <=0.25 SENSITIVE Sensitive     GENTAMICIN 2 SENSITIVE Sensitive     IMIPENEM 2 SENSITIVE Sensitive     PIP/TAZO 8 SENSITIVE Sensitive     CEFEPIME 4 SENSITIVE Sensitive     * ABUNDANT PSEUDOMONAS AERUGINOSA  MRSA PCR Screening     Status: None   Collection Time: 04/14/20  6:19 PM   Specimen: Nasopharyngeal  Result Value Ref Range Status   MRSA by PCR NEGATIVE NEGATIVE Final    Comment:        The GeneXpert MRSA Assay (FDA approved for NASAL specimens only), is one component of a comprehensive MRSA colonization surveillance program. It is not intended to diagnose MRSA infection nor to guide or monitor treatment for MRSA infections. Performed at [facility], [address], [place], [place] [address]   Urine Culture     Status: None   Collection Time: 04/15/20  4:28 AM   Specimen: Urine, Random  Result Value Ref Range Status   Specimen Description   Final    URINE, RANDOM Performed at [facility] [address] [place], [place] [address]    Special Requests   Final    NONE Performed at [facility], [address], [place], [place] [address]    Culture   Final    NO GROWTH Performed at [facility], [place] [address], [place],  [address]    Report Status 04/16/2020 FINAL  Final  CULTURE, BLOOD (ROUTINE X 2) w Reflex to ID Panel     Status: None   Collection Time: 04/22/20  6:12 PM   Specimen: BLOOD  Result Value Ref Range Status   Specimen Description BLOOD FOOT  Final   Special Requests  Final    BOTTLES DRAWN AEROBIC AND ANAEROBIC Blood Culture adequate volume   Culture   Final    NO GROWTH 5 DAYS Performed at [facility], [address] [place], [place] [address]    Report Status 04/27/2020 FINAL  Final  CULTURE, BLOOD (ROUTINE X 2) w Reflex to ID Panel     Status: None   Collection Time:  04/22/20  6:17 PM   Specimen: BLOOD  Result Value Ref Range Status   Specimen Description BLOOD FOOT  Final   Special Requests   Final    BOTTLES DRAWN AEROBIC AND ANAEROBIC Blood Culture results may not be optimal due to an excessive volume of blood received in culture bottles   Culture   Final    NO GROWTH 5 DAYS Performed at [facility], [address] [place], [place] [address]    Report Status 04/27/2020 FINAL  Final  CULTURE, BLOOD (ROUTINE X 2) w Reflex to ID Panel     Status: None   Collection Time: 05/01/20  2:50 PM   Specimen: BLOOD  Result Value Ref Range Status   Specimen Description BLOOD BLOOD LEFT HAND  Final   Special Requests   Final    BOTTLES DRAWN AEROBIC AND ANAEROBIC Blood Culture adequate volume   Culture   Final    NO GROWTH 5 DAYS Performed at [facility], [address] [place], [place] [address]    Report Status 05/06/2020 FINAL  Final  CULTURE, BLOOD (ROUTINE X 2) w Reflex to ID Panel     Status: None   Collection Time: 05/01/20  2:51 PM   Specimen: BLOOD  Result Value Ref Range Status   Specimen Description BLOOD BLOOD RIGHT HAND  Final   Special Requests   Final    BOTTLES DRAWN AEROBIC AND ANAEROBIC Blood Culture adequate volume   Culture   Final    NO GROWTH 5 DAYS Performed at [facility], [address], [place], [place] [address]    Report Status 05/06/2020 FINAL  Final  Aerobic/Anaerobic Culture (surgical/deep wound)     Status: None (Preliminary result)   Collection Time: 05/15/20  4:36 PM   Specimen: Abdomen; Abdominal Fluid  Result Value Ref Range Status   Specimen Description   Final    ABDOMEN Performed at [facility], [address], [place], [place] [address]    Special Requests   Final    ABDOMINAL FLUID Performed at [facility], [address] [place], [place] [address]    Gram Stain   Final    RARE WBC PRESENT, PREDOMINANTLY MONONUCLEAR NO ORGANISMS SEEN     Culture   Final    NO GROWTH 4 DAYS NO ANAEROBES ISOLATED; CULTURE IN PROGRESS FOR 5 DAYS Performed at [facility] [address] [place], [place] [address]    Report Status PENDING  Incomplete  Body fluid culture     Status: None (Preliminary result)   Collection Time: 05/16/20  6:37 PM   Specimen: JP Drain; Body Fluid  Result Value Ref Range Status   Specimen Description   Final    JP DRAINAGE Performed at [facility], [address], [place], [place] [address]    Special Requests   Final    NONE Performed at [facility], [address] [place], [place] [address]    Gram Stain   Final    FEW WBC PRESENT,BOTH PMN AND MONONUCLEAR ABUNDANT GRAM POSITIVE  COCCI MODERATE GRAM NEGATIVE RODS    Culture   Final    ABUNDANT PSEUDOMONAS AERUGINOSA ABUNDANT ENTEROCOCCUS SPECIES IDENTIFICATION AND SUSCEPTIBILITIES TO FOLLOW Performed at [facility], [place] [address], [place], [place] [address]    Report Status PENDING  Incomplete   Organism ID, Bacteria PSEUDOMONAS AERUGINOSA  Final      Susceptibility   Pseudomonas aeruginosa - MIC*    CEFTAZIDIME 4 SENSITIVE Sensitive     CIPROFLOXACIN 1 SENSITIVE Sensitive     GENTAMICIN 8 INTERMEDIATE Intermediate     IMIPENEM >=16 RESISTANT Resistant     PIP/TAZO 8 SENSITIVE Sensitive     * ABUNDANT PSEUDOMONAS AERUGINOSA    Coagulation Studies: No results for input(s): LABPROT, INR in the last 72 hours.  Urinalysis: No results for input(s): COLORURINE, LABSPEC, PHURINE, GLUCOSEU, HGBUR, BILIRUBINUR, KETONESUR, PROTEINUR, UROBILINOGEN, NITRITE, LEUKOCYTESUR in the last 72 hours.  Invalid input(s): APPERANCEUR    Imaging: No results found.   Medications:    sodium chloride 250 mL (05/03/20 0043)   sodium chloride Stopped (05/12/20 0620)   piperacillin-tazobactam (ZOSYN)  IV      sodium chloride   Intravenous Once   alteplase  2 mg Intracatheter Once   alteplase  2 mg Intracatheter Once    [START ON 05/20/2020] amiodarone  200 mg Oral Daily   Chlorhexidine Gluconate Cloth  6 each Topical Daily   rilpivirine  25 mg Oral Q breakfast   And   dolutegravir  50 mg Oral Q breakfast   dronabinol  5 mg Oral BID AC   epoetin (EPOGEN/PROCRIT) injection  10,000 Units Intravenous Q M,W,F-HD   famotidine  10 mg Oral Q1200   feeding supplement (ENSURE ENLIVE)  237 mL Oral TID BM   guaiFENesin-dextromethorphan  10 mL Oral Q8H   heparin injection (subcutaneous)  5,000 Units Subcutaneous Q8H   magnesium oxide  400 mg Oral Daily   mometasone-formoterol  2 puff Inhalation BID   multivitamin  1 tablet Oral QHS   multivitamin with minerals  1 tablet Oral Daily   sodium chloride flush  5 mL Intracatheter Q8H   torsemide  40 mg Oral Daily   sodium chloride, acetaminophen **OR** acetaminophen, albuterol, ALPRAZolam, bisacodyl, diphenhydrAMINE, docusate sodium, fentaNYL, heparin NICU/SCN flush, HYDROmorphone (DILAUDID) injection, iohexol, ipratropium-albuterol, magic mouthwash **AND** lidocaine, magnesium hydroxide, menthol-cetylpyridinium, metoprolol tartrate, oxyCODONE-acetaminophen, phenol, simethicone  Assessment/ Plan:  Ms. TANEIKA CHOI is a 59 y.o.  female with HIV, COPD, HTN , GERD, who is admitted for Dyspnea and  colonic diverticular abcess.    1. AKI requiring hemodialysis.  -Was on MWF schedule - Will hold dialysis today. - Creatinine trending down,1.97 today with GFR 32.  -Volume and electrolyte status acceptable 2. Anemia of CKD  -Required blood transfusions during the admission - Received  10,000 units epogen with dialysis  - Hgb 7.6 today -Will continue monitoring CBCs   3. Hypotension  - BP readings within acceptable range -Will continue monitoring   4. Hypokalemia  -  K+ 3.3 today.  -Will continue monitoring with follow up labs    LOS: 35 Princy Raju 9/20/20212:12 PM   Patient was seen and examined with Crosby Oyster, DNP. Above plan was discussed and agreed  upon on the signing of this note.   Lavonia Dana, MD [facility]  9/20/20213:22 PM

## 2020-05-19 NOTE — Progress Notes (Signed)
[facility] SURGICAL PROGRESS NOTE (cpt [phone])  Hospital Day(s): 35.   Post op day(s): 24 Days Post-Op.   Interval History:  Patient seen and examined no acute events or new complaints overnight.  Patient is alert and engaging this morning; family at bedside Only complaint is abdominal pain at the drain site, this is intermittent in nature.  No nausea, emesis No fevers in the last 24 hours, she is otherwise hemodynamically stable Leukocytosis has improved this morning; down to 27.6K Stable anemia; Hgb - 7.6 Renal function continues to make slow improvement, sCr - 1.97, UO is unmeasured however she has about 700-800 ccs in canister  Mild hypokalemia to 3.3 Drain output not recorded, seropurulent She did get new Cx from drain on 09/17; growing pseudomonas & enterococcus; susceptibilities pending  Tolerating regular diet, no issues Having bowel function Working with therapies; recommending SNF  Review of Systems:  Constitutional: denies fever, chills  HEENT: denies cough or congestion  Respiratory: denies any shortness of breath  Cardiovascular: denies chest pain or palpitations  Gastrointestinal: + abdominal pain (Drain site, intermittent), denied N/V, or diarrhea/and bowel function as per interval history Genitourinary: denies burning with urination or urinary frequency Musculoskeletal: denies pain, decreased motor or sensation  Vital signs in last 24 hours: [min-max] current  Temp:  [98.1 F (36.7 C)-98.8 F (37.1 C)] 98.8 F (37.1 C) (09/20 0356) Pulse Rate:  [74-92] 74 (09/20 0356) Resp:  [18-20] 20 (09/20 0356) BP: (102-111)/(48-63) 102/63 (09/20 0356) SpO2:  [94 %-100 %] 100 % (09/20 0356) FiO2 (%):  [0 %] 0 % (09/19 1255)     Height: 5\' 6"  (167.6 cm) Weight: 124.3 kg BMI (Calculated): 44.26   Intake/Output last 2 shifts:  09/19 0701 - 09/20 0700 In: 201.9 [P.O.:30; IV Piggyback:171.9] Out: -    Physical Exam:  Constitutional: alert, cooperative  and no distress HENT: normocephalic without obvious abnormality  Eyes: PERRL, EOM's grossly intact and symmetric  Respiratory: breathing non-labored at rest, on [place] Cardiovascular: regular rate and sinus rhythm  Gastrointestinal: soft,tenderness over suprapubic drain site, I am unable to illicit any additional tenderness, and non-distended, no rebound/guarding,certainly without evidence of peritonitis,JP in LLQ output is seropurulent Musculoskeletal: no edema or wounds, motor and sensation grossly intact, NT   Labs:  CBC Latest Ref Rng & Units 05/18/2020 05/17/2020 05/16/2020  WBC 4.0 - 10.5 K/uL 32.0(H) 32.4(H) 29.5(H)  Hemoglobin 12.0 - 15.0 g/dL 7.9(L) 7.3(L) 8.0(L)  Hematocrit 36 - 46 % 24.4(L) 23.2(L) 24.6(L)  Platelets 150 - 400 K/uL 251 228 283   CMP Latest Ref Rng & Units 05/18/2020 05/17/2020 05/16/2020  Glucose 70 - 99 mg/dL 79 69(L) 82  BUN 6 - 20 mg/dL 23(H) 20 30(H)  Creatinine 0.44 - 1.00 mg/dL 2.01(H) 2.10(H) 2.46(H)  Sodium 135 - 145 mmol/L 139 138 140  Potassium 3.5 - 5.1 mmol/L 3.5 3.4(L) 3.0(L)  Chloride 98 - 111 mmol/L 103 101 103  CO2 22 - 32 mmol/L 28 26 26   Calcium 8.9 - 10.3 mg/dL 7.3(L) 7.4(L) 7.4(L)  Total Protein 6.5 - 8.1 g/dL - - 5.3(L)  Total Bilirubin 0.3 - 1.2 mg/dL - - 1.0  Alkaline Phos 38 - 126 U/L - - 81  AST 15 - 41 U/L - - 25  ALT 0 - 44 U/L - - 24    Imaging studies: No new pertinent imaging studies   Assessment/Plan: (ICD-10's: K67.20) 59 y.o. 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 Again, I do think controlling this fistula/output/abscess with drainage is in her best interest and I believe we have started to make progress. She is at high risk for complications from surgical intervention and may requiring prolonged intubation post-operatively giving her respiratory comorbidities and this goes against her advanced directives/wishes. We will of course continue to closely follow and reassess should her clinical condition change   - Okay to continue diet   - Continue IV Abx(meropenem); ID followingand appreciate assistance; initial Cx withpseudomonas, kleb andESBLe.coli, most recent cx (09/17) with pseudomonas & enterococcus  - Monitor abdominal examination; leukocytosis - Pain control prn; antiemetics prn - Appreciate nephrology assistance and recommendations - Continue to work with therapies; suspect she will need significant rehab - Further management per primary service; we will continue to follow   All of the above findings and recommendations were discussed with the patient, patient's family (at bedside), and the medical team, and all of patient's and her family's questions were answered to their expressed satisfaction.  -- Edison Simon, PA-C [facility] 05/19/2020, 7:23 AM [phone] M-F: 7am - 4pm

## 2020-05-19 NOTE — Progress Notes (Signed)
Physical Therapy Treatment Patient Details Name: Melanie Bowen MRN: [id number] DOB: 1961/04/21 Today's Date: 05/19/2020    History of Present Illness  59 yo F who comes to [facility] on 8/16 c LLQ ABD pain. Pt admitted c Acute sigmoid diverticulitis with diverticular abscess. PMH: COPD, asthma, HTN, diverticulosis, HIV, GERD, depression, bilat TKA, 3 level lumbar fusion, susequent lumbar hardware removal. Pt moved to ICU early in stay, inititated on HD on 04/16/20, temp cath removed 8/25. Pt  has had AF RVR issues. Pt also reports a subacute Rt rotator cuff injury.  Had procedure on 05/06/20 for increasing drain tube size, to increase removal of abd drainage    PT Comments    Pt and pt's sister requested that pt be helped to sit up. Pt required +2 assist to come to sitting and was initially dizzy upon sitting up. Pt's dizziness subsided and pt was able to maintain quiet sitting balance with feet unsupported and hands in lap. However, pt became fatigued and began having SOB and dizziness. SpO2 was 99-100 and HR was 103. Pt was laid back down and symptoms subsided. BP and SpO2 were WNL. Pt will benefit from PT services in a SNF setting upon discharge to safely address deficits listed in patient problem list for decreased caregiver assistance and eventual return to PLOF.   Follow Up Recommendations  SNF     Equipment Recommendations  None recommended by PT    Recommendations for Other Services       Precautions / Restrictions Precautions Precautions: Fall Precaution Comments: percutaneous drainage tube abd Restrictions Weight Bearing Restrictions: No Other Position/Activity Restrictions: abd drain    Mobility  Bed Mobility Overal bed mobility: Needs Assistance Bed Mobility: Rolling;Supine to Sit;Sit to Supine Rolling: +2 for physical assistance;Total assist Sidelying to sit: +2 for physical assistance;Total assist Supine to sit: +2 for physical assistance;Total assist Sit to supine: +2  for physical assistance;Total assist   General bed mobility comments: +2 Able to come to sitting and pt could maintain sitting posture unsupported  Transfers                 General transfer comment: unable to attempt  Ambulation/Gait             General Gait Details: Unable to attempt   Stairs             Wheelchair Mobility    Modified Rankin (Stroke Patients Only)       Balance Overall balance assessment: Needs assistance Sitting-balance support: Feet unsupported;Bilateral upper extremity supported   Sitting balance - Comments: pt able to keept balance while sitting with hands in lap. However, pt was unable to maintain sitting posistion when pt became very dizzy       Standing balance comment: unable to attempt                            Cognition Arousal/Alertness: Lethargic Behavior During Therapy: Flat affect Overall Cognitive Status: Within Functional Limits for tasks assessed                                        Exercises Total Joint Exercises Ankle Circles/Pumps: AROM;10 reps;Both Quad Sets: AROM;Both;10 reps Other Exercises Other Exercises: pt sat EOB for 5 minutes for tolerence to sitting    General Comments General comments (skin integrity, edema, etc.): Sacral wound  limits mobility secondary to pain      Pertinent Vitals/Pain Pain Assessment: Faces Faces Pain Scale: Hurts little more Pain Location: sacrum Pain Descriptors / Indicators: Moaning;Sore Pain Intervention(s): Limited activity within patient's tolerance;Monitored during session    Home Living                      Prior Function            PT Goals (current goals can now be found in the care plan section) Acute Rehab PT Goals Patient Stated Goal: to get stronger and go home PT Goal Formulation: With patient Time For Goal Achievement: 05/31/20 Potential to Achieve Goals: Fair Progress towards PT goals: Progressing toward  goals    Frequency    Min 2X/week      PT Plan Current plan remains appropriate    Co-evaluation              AM-PAC PT "6 Clicks" Mobility   Outcome Measure  Help needed turning from your back to your side while in a flat bed without using bedrails?: Total Help needed moving from lying on your back to sitting on the side of a flat bed without using bedrails?: Total Help needed moving to and from a bed to a chair (including a wheelchair)?: Total Help needed standing up from a chair using your arms (e.g., wheelchair or bedside chair)?: Total Help needed to walk in hospital room?: Total Help needed climbing 3-5 steps with a railing? : Total 6 Click Score: 6    End of Session Equipment Utilized During Treatment: Oxygen;Gait belt Activity Tolerance: Patient limited by pain;Patient limited by fatigue Patient left: in bed;with nursing/sitter in room;with family/visitor present Nurse Communication: Mobility status, Tech notified about dizziness PT Visit Diagnosis: Unsteadiness on feet (R26.81);Other abnormalities of gait and mobility (R26.89);Muscle weakness (generalized) (M62.81);Difficulty in walking, not elsewhere classified (R26.2)     Time: 1020-1053 PT Time Calculation (min) (ACUTE ONLY): 33 min  Charges:                        Hervey Ard, SPT 05/19/20. 11:36 AM

## 2020-05-19 NOTE — Progress Notes (Signed)
Pharmacy Antibiotic Note  Melanie Bowen is a 59 y.o. female admitted on 04/14/2020 with intra-abdominal infection.  Pharmacy has been consulted for piperacillin/tazobactam dosing.  Day #11 meropenem - 9/17 drain culture with Pseudomonas resistant to carbapenem.  Enterococcus isolated, susc pending - renal function improving (assessing need for HD daily - currently not required)   Plan: Zosyn 3.375g IV q8h (4 hour infusion).  - await final cultures - monitor renal function   Height: 5\' 6"  (167.6 cm) Weight: 124.3 kg (274 lb 1.6 oz) IBW/kg (Calculated) : 59.3  Temp (24hrs), Avg:98.3 F (36.8 C), Min:98.1 F (36.7 C), Max:98.8 F (37.1 C)  Recent Labs  Lab 05/14/20 0604 05/15/20 0443 05/15/20 0444 05/16/20 0440 05/17/20 0849 05/18/20 0649 05/19/20 0653  WBC   < >  --  28.8* 29.5* 32.4* 32.0* 27.6*  CREATININE  --  2.32*  --  2.46* 2.10* 2.01* 1.97*   < > = values in this interval not displayed.    Estimated Creatinine Clearance: 41.9 mL/min (A) (by C-G formula based on SCr of 1.97 mg/dL (H)).    Allergies  Allergen Reactions  . Acetaminophen Nausea And Vomiting  . Ibuprofen Other (See Comments)    Reports causes bleeding  . Gabapentin Rash  . Morphine Itching and Rash  . Morphine And Related Itching  . Zanaflex  [Tizanidine] Rash    9/17 JP drain: Pseudomonas aeruginosa, enterococcus 9/2 bcx: NG 8/24 Bcx > NG Final 8/17 wound cx > pseudomonas (pan-sensitive), ESBL E. Coli (resistant to all but imipenem, gentamicin, and Zosyn), Klebsiella (resistant to ampicillin/intermediate to Unasyn) 8/16 Bcx > NG final  8/17 Ucx > NG Final 8/20 Fungitell >> elevated 202 8/16 HIV RNA >> <20   HIV+ on Dolutegravir and rilpivirine  8/16 Genvoya >> 8/17 (& PTA) 8/16 Valtrex >> 8/18 (& PTA) 8/17 meropenem >> 9/6, 9/10 >> 8/20 Levaquin x 1  8/17 anidulafungin >> 8/24, 9/3 >>9/20 8/16 CTX >> 8/17 8/16 Flagyl >> 8/17 8/16 Zosyn >> 8/17, 9/20 >>   Thank you for allowing  pharmacy to be a part of this patient's care.  Doreene Eland, PharmD, BCPS.   Work Cell: [phone] 05/19/2020 2:02 PM

## 2020-05-19 NOTE — Progress Notes (Signed)
ID Pt more alert. Talking Says she is feeling better Patient Vitals for the past 24 hrs:  BP Temp Temp src Pulse Resp SpO2  05/19/20 1400 (!) 125/54 99.6 F (37.6 C) Axillary 80 18 91 %  05/19/20 0740 (!) 112/53 98.1 F (36.7 C) Oral 96 18 99 %  05/19/20 0356 102/63 98.8 F (37.1 C) Oral 74 20 100 %  05/18/20 2026 (!) 111/48 98.1 F (36.7 C) Oral 90 18 95 %    Chest b/l air entry [id number] abd soft Left lower quadrant drain- greenish fluid  labs CBC Latest Ref Rng & Units 05/19/2020 05/18/2020 05/17/2020  WBC 4.0 - 10.5 K/uL 27.6(H) 32.0(H) 32.4(H)  Hemoglobin 12.0 - 15.0 g/dL 7.6(L) 7.9(L) 7.3(L)  Hematocrit 36 - 46 % 23.0(L) 24.4(L) 23.2(L)  Platelets 150 - 400 K/uL 241 251 228    CMP Latest Ref Rng & Units 05/19/2020 05/18/2020 05/17/2020  Glucose 70 - 99 mg/dL 84 79 69(L)  BUN 6 - 20 mg/dL 25(H) 23(H) 20  Creatinine 0.44 - 1.00 mg/dL 1.97(H) 2.01(H) 2.10(H)  Sodium 135 - 145 mmol/L 138 139 138  Potassium 3.5 - 5.1 mmol/L 3.3(L) 3.5 3.4(L)  Chloride 98 - 111 mmol/L 101 103 101  CO2 22 - 32 mmol/L 28 28 26   Calcium 8.9 - 10.3 mg/dL 7.2(L) 7.3(L) 7.4(L)  Total Protein 6.5 - 8.1 g/dL - - -  Total Bilirubin 0.3 - 1.2 mg/dL - - -  Alkaline Phos 38 - 126 U/L - - -  AST 15 - 41 U/L - - -  ALT 0 - 44 U/L - - -    Septic shocksecondary to perforated diverticulitisand peridiverticular abscess. pseudomonas, kleb andESBLe.coli in the culture. JP drain inserted on 04/14/20.  meropenem 04/15/20>>9/7/21and restarted 05/09/20 for fever anidulafungin 8/17>>8/24and restarted on 9/3/21for fever and leucocytosis- The latter is worsening- CT abdomen done on 9/13 shows pockets of collection andLoculated collection of extraluminal gas adjacent to the terminal Ileum. This is poor source control . Antibiotics do not work effectively and will only lead to MDRO and cdiff Drain upsized on 916/21 Culture enterococcus & pseudomonas which is R to meropenem Will DC meropenem and change to  zosyn DC anidulafungin    Encephalopathy-metabolic -fluctuating-  Hypoxic resp failureon presentation-needed intubation for a few days s/p extubation Rigorousincentive spirometry Chest PT because of secretions and atelectasis   AKI due to septic shock/contrast nephropathy-on  dialysis, improving  Anasarca- better  HIV- well controlled in jan 2021 her Vl <20 and cd4 >1000.  On 05/03/20 Vl is < 20 Cd4301 ( 50%)On dolutegravir and rilpivirine - started taking oral meds  Afib on amiodarone Discussed the management with patient and care team

## 2020-05-19 NOTE — Progress Notes (Signed)
Progress Note    Melanie Bowen  PPJ:[id number] DOB: 1961-07-30  DOA: 04/14/2020 PCP: Theotis Burrow, MD      Brief Narrative:    Medical records reviewed and are as summarized below:  Melanie Bowen is a 59 y.o. female with medical history significant for hypertension, diverticulosis, enteric infection, morbid obesity, GERD, COPD, history of stroke, asthma, depression, seizures enzymes with acute onset of abdominal pain nausea vomiting and constipation.   She was admitted to the hospital for septic shock secondary to acute diverticulitis with diverticular abscess.  She was treated with IV fluids and empiric IV antimicrobial therapy.  She required vasopressors for septic shock.  She was seen by interventional radiologist and CT-guided drainage was done by IR on 04/14/2020.  Fluid culture showed Pseudomonas aeruginosa, Klebsiella pneumonia and E. coli.  Septic shock was complicated by acute kidney injury, acute liver failure, acute metabolic encephalopathy and acute hypoxemic respiratory failure.  She was seen by the nephrologist for AKI.  Patient was started on hemodialysis for AKI. She developed atrial fibrillation with RVR.  She was seen in consultation by the cardiologist as well      Assessment/Plan:   Principal Problem:   Severe sepsis with septic shock ([place]) Active Problems:   HTN (hypertension)   HIV (human immunodeficiency virus infection) ([place])   Diverticulitis of large intestine with abscess without bleeding   Abscess of sigmoid colon due to diverticulitis   Acute renal failure (ARF) ([place])   Hypotension   Diverticulosis   Acute respiratory distress   DNR (do not resuscitate) discussion   Palliative care by specialist   Dyspnea   Goals of care, counseling/discussion   Pressure injury of skin   Nutrition Problem: Increased nutrient needs Etiology: chronic illness (COPD, new HD)  Signs/Symptoms: estimated needs   Body mass index is 44.24 kg/m.   (Morbid obesity)  Septic shock - resolved  Acute sigmoid diverticulitis and diverticular abscess, significant leukocytosis- s/p CT-guided drainage by IR on 04/14/2020.  Fluid culture showed Klebsiella pneumonia, pseudomonas aeruginosa and E. Coli.  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  No acute abnormality on CT head on 05/02/2020  History of stroke    PLAN  S/p successful upsizing and repositioning of indwelling lower abdominal drain from 12 [place] to 14 [place] and 05/15/2020.  There is concern for suspected perforation of diverticulum.  Antibiotic has been changed from meropenem to IV Zosyn statin on 05/19/2020.  Anidulafungin was discontinued on 05/19/2020.  (of note, patient had IV meropenem from 04/15/2020 to 05/06/2020 and meropenem was restarted on 05/09/2020 and discontinued on 05/19/2020.  She also had a anidulafungin from 04/15/2020 to 04/22/2020 and this was restarted on 05/02/2020 and discontinued on 05/19/2020)  S/p transfusion with 1 unit of PRBCs on 05/01/2020, 05/04/2020, 05/09/2020 and 05/15/2020. Monitor H&H closely.  Replete potassium and monitor levels. Decrease amiodarone from 200 mg twice daily to 200 mg daily. Continue antiretroviral therapy Continue bronchodilators Urine output and creatinine has improved.  Hemodialysis has been held for now. Follow-up with nephrologist, ID and general surgeon. Encouraged ambulation. Plan of care was discussed with the sister, Tammy at the bedside    Diet Order  DIET DYS 3 Room service appropriate? Yes; Fluid consistency: Thin  Diet effective  now                    Consultants:  General surgeon  Cardiologist  Nephrologist  Vascular surgeon  Interventional radiologist  Procedures:  CT-guided drainage of diverticular abscess on 04/14/2020    Medications:   . sodium chloride   Intravenous Once  . alteplase  2 mg Intracatheter Once  . alteplase  2 mg Intracatheter Once  . [START ON 05/20/2020] amiodarone  200 mg Oral Daily  . Chlorhexidine Gluconate Cloth  6 each Topical Daily  . rilpivirine  25 mg Oral Q breakfast   And  . dolutegravir  50 mg Oral Q breakfast  . dronabinol  5 mg Oral BID AC  . epoetin (EPOGEN/PROCRIT) injection  10,000 Units Intravenous Q M,W,F-HD  . famotidine  10 mg Oral Q1200  . feeding supplement (ENSURE ENLIVE)  237 mL Oral TID BM  . guaiFENesin-dextromethorphan  10 mL Oral Q8H  . heparin injection (subcutaneous)  5,000 Units Subcutaneous Q8H  . magnesium oxide  400 mg Oral Daily  . mometasone-formoterol  2 puff Inhalation BID  . multivitamin  1 tablet Oral QHS  . multivitamin with minerals  1 tablet Oral Daily  . sodium chloride flush  5 mL Intracatheter Q8H  . torsemide  40 mg Oral Daily   Continuous Infusions: . sodium chloride 250 mL (05/03/20 0043)  . sodium chloride Stopped (05/12/20 0620)  . piperacillin-tazobactam (ZOSYN)  IV       Anti-infectives (From admission, onward)   Start     Dose/Rate Route Frequency Ordered Stop   05/19/20 1500  piperacillin-tazobactam (ZOSYN) IVPB 3.375 g        3.375 g 12.5 mL/hr over 240 Minutes Intravenous Every 8 hours 05/19/20 1356     05/18/20 2000  meropenem (MERREM) 1 g in sodium chloride 0.9 % 100 mL IVPB  Status:  Discontinued        1 g 200 mL/hr over 30 Minutes Intravenous Every 12 hours 05/18/20 1657 05/19/20 1323   05/10/20 2200  meropenem (MERREM) 500 mg in sodium chloride 0.9 % 100 mL IVPB  Status:  Discontinued        500 mg 200 mL/hr over 30 Minutes Intravenous Every 12 hours 05/10/20 1324 05/10/20 1328   05/10/20  2200  meropenem (MERREM) 500 mg in sodium chloride 0.9 % 100 mL IVPB  Status:  Discontinued        500 mg 200 mL/hr over 30 Minutes Intravenous Daily 05/10/20 1328 05/18/20 1657   05/09/20 2200  meropenem (MERREM) 1 g in sodium chloride 0.9 % 100 mL IVPB  Status:  Discontinued        1 g 200 mL/hr over 30 Minutes Intravenous Every 12 hours 05/09/20 2121 05/10/20 1324   05/07/20 1300  rilpivirine (EDURANT) tablet 25 mg       "And" Linked Group Details   25 mg Oral Daily with breakfast 05/07/20 1245     05/07/20 1300  dolutegravir (TIVICAY) tablet 50 mg       "And" Linked Group Details   50 mg Oral Daily with breakfast 05/07/20 1245     05/06/20 0956  meropenem (MERREM) 500 mg in sodium chloride 0.9 % 100 mL IVPB  Status:  Discontinued        500 mg 200 mL/hr over 30 Minutes Intravenous Every 24 hours 05/05/20 1018 05/05/20 1637   05/03/20 2000  anidulafungin (ERAXIS) 100 mg in sodium chloride 0.9 % 100 mL IVPB  Status:  Discontinued        100 mg 78 mL/hr over 100 Minutes Intravenous Every 24 hours 05/02/20 2223 05/19/20 1321   05/02/20 2330  anidulafungin (ERAXIS) 200 mg in sodium chloride 0.9 % 200 mL IVPB        200 mg 78 mL/hr over 200 Minutes Intravenous  Once 05/02/20 2223 05/03/20 0455   04/30/20 0800  rilpivirine (EDURANT) tablet 25 mg  Status:  Discontinued       "And" Linked Group Details   25 mg Oral Daily with breakfast 04/29/20 1254 05/02/20 2223   04/30/20 0800  dolutegravir (TIVICAY) tablet 50 mg  Status:  Discontinued       "And" Linked Group Details   50 mg Oral Daily with breakfast 04/29/20 1254 05/02/20 2223   04/28/20 2200  meropenem (MERREM) 500 mg in sodium chloride 0.9 % 100 mL IVPB  Status:  Discontinued        500 mg 200 mL/hr over 30 Minutes Intravenous Every 12 hours 04/28/20 1546 05/05/20 1018   04/26/20 2200  meropenem (MERREM) 500 mg in sodium chloride 0.9 % 100 mL IVPB  Status:  Discontinued        500 mg 200 mL/hr over 30 Minutes Intravenous Every 8  hours 04/26/20 1258 04/28/20 1546   04/25/20 1700  rilpivirine (EDURANT) tablet 25 mg  Status:  Discontinued        25 mg Oral Daily with breakfast 04/24/20 1636 04/25/20 1111   04/25/20 1200  rilpivirine (EDURANT) tablet 25 mg  Status:  Discontinued        25 mg Oral Daily with breakfast 04/25/20 1111 04/29/20 1254   04/25/20 1000  meropenem (MERREM) 500 mg in sodium chloride 0.9 % 100 mL IVPB  Status:  Discontinued        500 mg 200 mL/hr over 30 Minutes Intravenous Every 12 hours 04/25/20 0935 04/26/20 1258   04/24/20 1700  darunavir-cobicistat (PREZCOBIX) 800-150 MG per tablet 1 tablet  Status:  Discontinued        1 tablet Oral Daily with breakfast 04/24/20 1231 04/24/20 1636   04/24/20 1330  dolutegravir (TIVICAY) tablet 50 mg  Status:  Discontinued        50 mg Oral Daily 04/24/20 1231 04/29/20 1254   04/23/20 2200  meropenem (MERREM) 500 mg in sodium chloride 0.9 % 100 mL IVPB  Status:  Discontinued        500 mg 200 mL/hr over 30 Minutes Intravenous Every 24 hours 04/23/20 1127 04/25/20 0935   04/18/20 2000  levofloxacin (LEVAQUIN) IVPB 750 mg        750 mg 100 mL/hr over 90 Minutes Intravenous  Once 04/18/20 1857 04/19/20 0004   04/18/20 1800  levofloxacin (LEVAQUIN) IVPB 750 mg  Status:  Discontinued        750 mg 100 mL/hr over 90 Minutes Intravenous  Once 04/18/20 1640 04/18/20 1857   04/16/20 2200  anidulafungin (ERAXIS) 100 mg in sodium chloride 0.9 % 100 mL IVPB        100 mg 78 mL/hr over 100 Minutes Intravenous Every 24 hours 04/16/20 1707 04/22/20 0211   04/15/20 2200  valACYclovir (VALTREX) tablet 500 mg  Status:  Discontinued        500 mg Oral Daily at bedtime 04/15/20 1248 04/16/20 1259   04/15/20 2200  anidulafungin (ERAXIS) 200 mg in sodium chloride 0.9 % 200 mL IVPB  200 mg 78 mL/hr over 200 Minutes Intravenous  Once 04/15/20 2008 04/16/20 0327   04/15/20 2200  meropenem (MERREM) 1 g in sodium chloride 0.9 % 100 mL IVPB  Status:  Discontinued        1  g 200 mL/hr over 30 Minutes Intravenous Every 12 hours 04/15/20 2017 04/23/20 1127   04/15/20 1400  piperacillin-tazobactam (ZOSYN) IVPB 3.375 g  Status:  Discontinued        3.375 g 12.5 mL/hr over 240 Minutes Intravenous Every 8 hours 04/15/20 1300 04/15/20 2006   04/14/20 2200  elvitegravir-cobicistat-emtricitabine-tenofovir (GENVOYA) 150-150-200-10 MG tablet 1 tablet  Status:  Discontinued        1 tablet Oral Daily at bedtime 04/14/20 0235 04/15/20 0957   04/14/20 2200  valACYclovir (VALTREX) tablet 1,000 mg  Status:  Discontinued        1,000 mg Oral Daily at bedtime 04/14/20 0235 04/15/20 1248   04/14/20 1230  cefTRIAXone (ROCEPHIN) 2 g in sodium chloride 0.9 % 100 mL IVPB  Status:  Discontinued        2 g 200 mL/hr over 30 Minutes Intravenous Every 24 hours 04/14/20 1223 04/15/20 1300   04/14/20 1230  metroNIDAZOLE (FLAGYL) IVPB 500 mg  Status:  Discontinued        500 mg 100 mL/hr over 60 Minutes Intravenous Every 8 hours 04/14/20 1223 04/15/20 1636   04/14/20 1030  piperacillin-tazobactam (ZOSYN) IVPB 3.375 g  Status:  Discontinued        3.375 g 12.5 mL/hr over 240 Minutes Intravenous Every 8 hours 04/14/20 0235 04/14/20 1223   04/14/20 0215  piperacillin-tazobactam (ZOSYN) IVPB 4.5 g  Status:  Discontinued        4.5 g 200 mL/hr over 30 Minutes Intravenous  Once 04/14/20 0200 04/14/20 0208   04/14/20 0215  piperacillin-tazobactam (ZOSYN) IVPB 3.375 g        3.375 g 100 mL/hr over 30 Minutes Intravenous  Once 04/14/20 0208 04/14/20 0301             Family Communication/Anticipated D/C date and plan/Code Status   DVT prophylaxis:      Code Status: Partial Code  Family Communication:  Disposition Plan:    Status is: Inpatient  Remains inpatient appropriate because:IV treatments appropriate due to intensity of illness or inability to take PO and Inpatient level of care appropriate due to severity of illness   Dispo: The patient is from: Home               Anticipated d/c is to: SNF              Anticipated d/c date is: > 3 days              Patient currently is not medically stable to d/c.           Subjective:   No new complaints.  Abdominal pain is not as bad today.  No shortness of breath or chest pain.  Her sister, Melanie Bowen, is at the bedside.  Objective:    Vitals:   05/18/20 2026 05/19/20 0356 05/19/20 0740 05/19/20 1400  BP: (!) 111/48 102/63 (!) 112/53 (!) 125/54  Pulse: 90 74 96 80  Resp: 18 20 18 18   Temp: 98.1 F (36.7 C) 98.8 F (37.1 C) 98.1 F (36.7 C) 99.6 F (37.6 C)  TempSrc: Oral Oral Oral Axillary  SpO2: 95% 100% 99% 91%  Weight:      Height:  No data found.   Intake/Output Summary (Last 24 hours) at 05/19/2020 1434 Last data filed at 05/19/2020 1127 Gross per 24 hour  Intake 100 ml  Output 630 ml  Net -530 ml   Filed Weights   05/03/20 0547 05/09/20 0951 05/14/20 1349  Weight: 130.9 kg 119 kg 124.3 kg    Exam:   GEN: No acute distress, she is more communicative today SKIN: Warm and dry EYES: Pale, anicteric ENT: Moist mucous membranes CV: Regular rate and rhythm PULM: No wheezing or rales heard ABD: Soft, mild lower abdominal tenderness, +JP drain in lower abdomen CNS: Alert and oriented x4.  No focal deficits. EXT: No edema or tenderness     Data Reviewed:   I have personally reviewed following labs and imaging studies:  Labs: Labs show the following:   Basic Metabolic Panel: Recent Labs  Lab 05/15/20 0443 05/15/20 0443 05/16/20 0440 05/16/20 0440 05/16/20 0443 05/17/20 0849 05/17/20 0849 05/18/20 0649 05/19/20 0653  NA 137  --  140  --   --  138  --  139 138  K 3.0*   < > 3.0*   < >  --  3.4*   < > 3.5 3.3*  CL 100  --  103  --   --  101  --  103 101  CO2 27  --  26  --   --  26  --  28 28  GLUCOSE 96  --  82  --   --  69*  --  79 84  BUN 26*  --  30*  --   --  20  --  23* 25*  CREATININE 2.32*  --  2.46*  --   --  2.10*  --  2.01* 1.97*  CALCIUM 7.4*  --   7.4*  --   --  7.4*  --  7.3* 7.2*  MG  --   --  1.9  --   --   --   --   --   --   PHOS  --   --   --   --  3.4  --   --   --   --    < > = values in this interval not displayed.   GFR Estimated Creatinine Clearance: 41.9 mL/min (A) (by C-G formula based on SCr of 1.97 mg/dL (H)). Liver Function Tests: Recent Labs  Lab 05/14/20 0604 05/16/20 0440  AST 25 25  ALT 23 24  ALKPHOS 78 81  BILITOT 1.3* 1.0  PROT 5.3* 5.3*  ALBUMIN 1.1* 1.1*   No results for input(s): LIPASE, AMYLASE in the last 168 hours. No results for input(s): AMMONIA in the last 168 hours. Coagulation profile Recent Labs  Lab 05/15/20 0443  INR 1.2    CBC: Recent Labs  Lab 05/15/20 0444 05/16/20 0440 05/17/20 0849 05/18/20 0649 05/19/20 0653  WBC 28.8* 29.5* 32.4* 32.0* 27.6*  NEUTROABS  --  19.3* 22.3* 22.8* 19.9*  HGB 7.0* 8.0* 7.3* 7.9* 7.6*  HCT 22.6* 24.6* 23.2* 24.4* 23.0*  MCV 93.4 91.4 92.1 91.4 89.8  PLT 338 283 228 251 241   Cardiac Enzymes: No results for input(s): CKTOTAL, CKMB, CKMBINDEX, TROPONINI in the last 168 hours. BNP (last 3 results) No results for input(s): PROBNP in the last 8760 hours. CBG: No results for input(s): GLUCAP in the last 168 hours. D-Dimer: No results for input(s): DDIMER in the last 72 hours. Hgb A1c: No results for input(s): HGBA1C in the  last 72 hours. Lipid Profile: No results for input(s): CHOL, HDL, LDLCALC, TRIG, CHOLHDL, LDLDIRECT in the last 72 hours. Thyroid function studies: No results for input(s): TSH, T4TOTAL, T3FREE, THYROIDAB in the last 72 hours.  Invalid input(s): FREET3 Anemia work up: No results for input(s): VITAMINB12, FOLATE, FERRITIN, TIBC, IRON, RETICCTPCT in the last 72 hours. Sepsis Labs: Recent Labs  Lab 05/16/20 0440 05/17/20 0849 05/18/20 0649 05/19/20 0653  WBC 29.5* 32.4* 32.0* 27.6*    Microbiology Recent Results (from the past 240 hour(s))  Aerobic/Anaerobic Culture (surgical/deep wound)     Status: None  (Preliminary result)   Collection Time: 05/15/20  4:36 PM   Specimen: Abdomen; Abdominal Fluid  Result Value Ref Range Status   Specimen Description   Final    ABDOMEN Performed at [facility], [address], [place], [place] [address]    Special Requests   Final    ABDOMINAL FLUID Performed at [facility], [address] [place], [place] [address]    Gram Stain   Final    RARE WBC PRESENT, PREDOMINANTLY MONONUCLEAR NO ORGANISMS SEEN    Culture   Final    NO GROWTH 4 DAYS NO ANAEROBES ISOLATED; CULTURE IN PROGRESS FOR 5 DAYS Performed at [facility] [address] [place], [place] [address]    Report Status PENDING  Incomplete  Body fluid culture     Status: None (Preliminary result)   Collection Time: 05/16/20  6:37 PM   Specimen: JP Drain; Body Fluid  Result Value Ref Range Status   Specimen Description   Final    JP DRAINAGE Performed at [facility], [address] [place], [place] [address]    Special Requests   Final    NONE Performed at [facility], [address] [place], [place] [address]    Gram Stain   Final    FEW WBC PRESENT,BOTH PMN AND MONONUCLEAR ABUNDANT GRAM POSITIVE COCCI MODERATE GRAM NEGATIVE RODS    Culture   Final    ABUNDANT PSEUDOMONAS AERUGINOSA ABUNDANT ENTEROCOCCUS SPECIES IDENTIFICATION AND SUSCEPTIBILITIES TO FOLLOW Performed at [facility], [place] [address] [place], [place] [address]    Report Status PENDING  Incomplete   Organism ID, Bacteria PSEUDOMONAS AERUGINOSA  Final      Susceptibility   Pseudomonas aeruginosa - MIC*    CEFTAZIDIME 4 SENSITIVE Sensitive     CIPROFLOXACIN 1 SENSITIVE Sensitive     GENTAMICIN 8 INTERMEDIATE Intermediate     IMIPENEM >=16 RESISTANT Resistant     PIP/TAZO 8 SENSITIVE Sensitive     * ABUNDANT PSEUDOMONAS AERUGINOSA    Procedures and diagnostic studies:  No results found.             LOS: 35 days   [place]  Copywriter, advertising on [email or web address]. If 7PM-7AM, please contact night-coverage at [email or web address]     05/19/2020, 2:34 PM

## 2020-05-19 NOTE — Progress Notes (Signed)
   05/19/20 1300  Clinical Encounter Type  Visited With Patient and family together;Health care provider  Visit Type Follow-up  Referral From Chaplain  Consult/Referral To Chaplain  While rounding, chaplain stopped in to see patient, but she was sleeping. Chaplain will follow up later.

## 2020-05-20 LAB — BASIC METABOLIC PANEL
Anion gap: 8 (ref 5–15)
BUN: 25 mg/dL — ABNORMAL HIGH (ref 6–20)
CO2: 29 mmol/L (ref 22–32)
Calcium: 7.2 mg/dL — ABNORMAL LOW (ref 8.9–10.3)
Chloride: 102 mmol/L (ref 98–111)
Creatinine, Ser: 1.87 mg/dL — ABNORMAL HIGH (ref 0.44–1.00)
GFR calc Af Amer: 34 mL/min — ABNORMAL LOW (ref >60)
GFR calc non Af Amer: 29 mL/min — ABNORMAL LOW (ref >60)
Glucose, Bld: 76 mg/dL (ref 70–99)
Potassium: 3.6 mmol/L (ref 3.5–5.1)
Sodium: 139 mmol/L (ref 135–145)

## 2020-05-20 LAB — CBC WITH DIFFERENTIAL/PLATELET
Abs Immature Granulocytes: 1.52 10*3/uL — ABNORMAL HIGH (ref 0.00–0.07)
Basophils Absolute: 0.1 10*3/uL (ref 0.0–0.1)
Basophils Relative: 0 %
Eosinophils Absolute: 0.1 10*3/uL (ref 0.0–0.5)
Eosinophils Relative: 1 %
HCT: 22.8 % — ABNORMAL LOW (ref 36.0–46.0)
Hemoglobin: 7.3 g/dL — ABNORMAL LOW (ref 12.0–15.0)
Immature Granulocytes: 7 %
Lymphocytes Relative: 8 %
Lymphs Abs: 1.8 10*3/uL (ref 0.7–4.0)
MCH: 30.3 pg (ref 26.0–34.0)
MCHC: 32 g/dL (ref 30.0–36.0)
MCV: 94.6 fL (ref 80.0–100.0)
Monocytes Absolute: 3 10*3/uL — ABNORMAL HIGH (ref 0.1–1.0)
Monocytes Relative: 13 %
Neutro Abs: 16.4 10*3/uL — ABNORMAL HIGH (ref 1.7–7.7)
Neutrophils Relative %: 71 %
Platelets: 264 10*3/uL (ref 150–400)
RBC: 2.41 MIL/uL — ABNORMAL LOW (ref 3.87–5.11)
RDW: 19.9 % — ABNORMAL HIGH (ref 11.5–15.5)
Smear Review: NORMAL
WBC: 22.9 10*3/uL — ABNORMAL HIGH (ref 4.0–10.5)
nRBC: 0.3 % — ABNORMAL HIGH (ref 0.0–0.2)

## 2020-05-20 LAB — AEROBIC/ANAEROBIC CULTURE W GRAM STAIN (SURGICAL/DEEP WOUND): Culture: NO GROWTH

## 2020-05-20 LAB — BODY FLUID CULTURE

## 2020-05-20 NOTE — Progress Notes (Signed)
[facility] SURGICAL PROGRESS NOTE (cpt [phone])  Hospital Day(s): 36.   Interval History:  Patient seen and examined no acute events or new complaints overnight.  Patient reports she is doing well, no significant complaints this morning No nausea, emesis, chills No fevers in the last 24 hours, she is otherwise hemodynamically stable Leukocytosis continues to improve this morning; down to 22.9K Stable anemia; Hgb - 7.3 Renal function continues to make slow improvement, sCr - 1.87, UO is unmeasured however she has about 600 ccs in canister  Drain output 60 ccs, seropurulent She did get new Cx from drain on 09/17; growing pseudomonas & enterococcus; susceptibilities reviewed Tolerating regular diet, no issues Having bowel function Working with therapies; recommending SNF  Review of Systems:  Constitutional: denies fever, chills  HEENT: denies cough or congestion  Respiratory: denies any shortness of breath  Cardiovascular: denies chest pain or palpitations  Gastrointestinal: denies abdominal pain, denied N/V, or diarrhea/and bowel function as per interval history Genitourinary: denies burning with urination or urinary frequency Musculoskeletal: denies pain, decreased motor or sensation  Vital signs in last 24 hours: [min-max] current  Temp:  [97.5 F (36.4 C)-99.6 F (37.6 C)] 97.9 F (36.6 C) (09/21 0523) Pulse Rate:  [79-87] 82 (09/21 0523) Resp:  [18-19] 19 (09/20 1942) BP: (103-125)/(54-98) 116/55 (09/21 0523) SpO2:  [91 %-100 %] 97 % (09/21 0523)     Height: 5\' 6"  (167.6 cm) Weight: 124.3 kg BMI (Calculated): 44.26   Intake/Output last 2 shifts:  09/20 0701 - 09/21 0700 In: 329.5 [P.O.:220; IV Piggyback:109.5] Out: 660 [Urine:600; Drains:60]   Physical Exam:  Constitutional: alert, cooperative and no distress HENT: normocephalic without obvious abnormality  Eyes: PERRL, EOM's grossly intact and symmetric  Respiratory: breathing non-labored at rest, on  [place] Cardiovascular: regular rate and sinus rhythm  Gastrointestinal: soft,tenderness over suprapubic drain site, I am unable to illicit any additional tenderness, and non-distended, no rebound/guarding,certainly without evidence of peritonitis,JP in LLQ outputis seropurulent Musculoskeletal: no edema or wounds, motor and sensation grossly intact, NT   Labs:  CBC Latest Ref Rng & Units 05/20/2020 05/19/2020 05/18/2020  WBC 4.0 - 10.5 K/uL 22.9(H) 27.6(H) 32.0(H)  Hemoglobin 12.0 - 15.0 g/dL 7.3(L) 7.6(L) 7.9(L)  Hematocrit 36 - 46 % 22.8(L) 23.0(L) 24.4(L)  Platelets 150 - 400 K/uL 264 241 251   CMP Latest Ref Rng & Units 05/20/2020 05/19/2020 05/18/2020  Glucose 70 - 99 mg/dL 76 84 79  BUN 6 - 20 mg/dL 25(H) 25(H) 23(H)  Creatinine 0.44 - 1.00 mg/dL 1.87(H) 1.97(H) 2.01(H)  Sodium 135 - 145 mmol/L 139 138 139  Potassium 3.5 - 5.1 mmol/L 3.6 3.3(L) 3.5  Chloride 98 - 111 mmol/L 102 101 103  CO2 22 - 32 mmol/L 29 28 28   Calcium 8.9 - 10.3 mg/dL 7.2(L) 7.2(L) 7.3(L)  Total Protein 6.5 - 8.1 g/dL - - -  Total Bilirubin 0.3 - 1.2 mg/dL - - -  Alkaline Phos 38 - 126 U/L - - -  AST 15 - 41 U/L - - -  ALT 0 - 44 U/L - - -    Imaging studies: No new pertinent imaging studies   Assessment/Plan: (ICD-10's: K41.20) 59 y.o. female with slow improvement, improvement in leukocytosis, admitted with sigmoid diverticulitis with abscess s/p percutaneous drainage on 08/16andshe continues to bewithout any evidence of peritonitis/pneumoperitoneum/pneumatosis on examination or imaginghowever her drain output has now become feculentconcerning for colonic fistula however we appear to have better control of output with up-sizing drain on 09/16,complicated by  pertinent comorbidities includingmarked COPD   - Her leukocytosis has continued to improve this morning, and she remains without hemodynamic instability nor peritonitis on examination. Again, I do think controlling this fistula/output/abscess with  drainage is in her best interest and I believe we have made progress with this.She is at high risk for complications from surgical intervention and may requiring prolonged intubation post-operatively giving her respiratory comorbidities and this goes against her advanced directives/wishes. We will of course continue to closely follow and reassess should her clinical condition change              - Okay to continue diet              - Continue IV Abx(meropenem); ID followingand appreciate assistance; initial Cx withpseudomonas, kleb andESBLe.coli, most recent cx (09/17) with pseudomonas & enterococcus  - Monitor abdominal examination; leukocytosis - Pain control prn; antiemetics prn - Appreciate nephrology assistance and recommendations - Continue to work with therapies; suspect she will need significant rehab - Further management per primary service; we will continue to follow   All of the above findings and recommendations were discussed with the patient, patient's family (at bedside), and the medical team, and all of patient's and her family's questions were answered to their expressed satisfaction.  -- Edison Simon, PA-C [facility] 05/20/2020, 7:56 AM [phone] M-F: 7am - 4pm

## 2020-05-20 NOTE — Progress Notes (Signed)
Progress Note    Melanie Bowen  GEX:[id number] DOB: Apr 22, 1961  DOA: 04/14/2020 PCP: Theotis Burrow, MD      Brief Narrative:    Medical records reviewed and are as summarized below:  Melanie Bowen is a 58 y.o. female with medical history significant for hypertension, diverticulosis, enteric infection, morbid obesity, GERD, COPD, HIV infection, history of stroke, asthma, depression, seizures enzymes with acute onset of abdominal pain nausea vomiting and constipation.    She was admitted to the hospital for septic shock secondary to acute diverticulitis with diverticular abscess.  She was treated with IV fluids and empiric IV antimicrobial therapy.  She required vasopressors for septic shock.  She was seen by interventional radiologist and CT-guided drainage was done by IR on 04/14/2020.  Fluid culture showed Pseudomonas aeruginosa, Klebsiella pneumonia and E. coli.  Septic shock was complicated by acute kidney injury, acute liver failure, acute metabolic encephalopathy and acute hypoxemic respiratory failure.  She was seen by the nephrologist for AKI.  Patient was started on hemodialysis for AKI. She developed atrial fibrillation with RVR.  She was seen in consultation by the cardiologist as well.  She remains on amiodarone for atrial fibrillation.  Because of persistent leukocytosis and recurrent fever, patient underwent upsizing and repositioning of indwelling lower abdominal drain from 12 [place] to 14 [place] on 05/15/2020.  There was concern for suspected perforation of diverticulum on imaging.  She has been transfused with about 4 units of packed red blood cells thus far for severe anemia.      Assessment/Plan:   Principal Problem:   Severe sepsis with septic shock (HCC) Active Problems:   HTN (hypertension)   HIV (human immunodeficiency virus infection) ([place])   Diverticulitis of large intestine with abscess without bleeding   Abscess of sigmoid colon due to  diverticulitis   Acute renal failure (ARF) (HCC)   Hypotension   Diverticulosis   Acute respiratory distress   DNR (do not resuscitate) discussion   Palliative care by specialist   Dyspnea   Goals of care, counseling/discussion   Pressure injury of skin   Nutrition Problem: Increased nutrient needs Etiology: chronic illness (COPD, new HD)  Signs/Symptoms: estimated needs   Body mass index is 44.24 kg/m.  (Morbid obesity)  Septic shock - resolved  Acute sigmoid diverticulitis and diverticular abscess, significant leukocytosis- s/p CT-guided drainage by IR on 04/14/2020.  Fluid culture showed Klebsiella pneumonia, pseudomonas aeruginosa and E. Coli.  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  No acute abnormality on CT head on 05/02/2020  History of stroke    PLAN  S/p successful upsizing and repositioning of indwelling lower abdominal drain from 12 [place] to 14 [place] and 05/15/2020.  There is concern for suspected perforation of diverticulum.  Continue IV Zosyn (started on 05/19/2020)  Anidulafungin was discontinued on 05/19/2020.  (of note, patient had IV meropenem from 04/15/2020 to 05/06/2020 and meropenem was restarted on 05/09/2020 and discontinued on 05/19/2020.  She also had a anidulafungin from 04/15/2020 to 04/22/2020 and this was restarted on 05/02/2020 and discontinued on 05/19/2020)  S/p transfusion with 1 unit of PRBCs on 05/01/2020, 05/04/2020, 05/09/2020 and 05/15/2020. Monitor H&H closely.  Potassium  level is better today.  Continue to monitor.  Urine output and creatinine have improved  off of dialysis.  Last dialysis session was on 05/14/2020.  Continue to monitor creatinine and urine output  Continue amiodarone for A. Fib  Continue antiretroviral therapy for HIV infection  Continue bronchodilators as needed for COPD  Follow-up with nephrologist, ID and general surgeon.    Diet Order            DIET DYS 3 Room service appropriate? Yes; Fluid consistency: Thin  Diet effective now                    Consultants:  General surgeon  Cardiologist  Nephrologist  Vascular surgeon  Interventional radiologist  Procedures:  CT-guided drainage of diverticular abscess on 04/14/2020    Medications:   . sodium chloride   Intravenous Once  . alteplase  2 mg Intracatheter Once  . alteplase  2 mg Intracatheter Once  . amiodarone  200 mg Oral Daily  . Chlorhexidine Gluconate Cloth  6 each Topical Daily  . dextromethorphan-guaiFENesin  10 mL Oral Q8H  . rilpivirine  25 mg Oral Q breakfast   And  . dolutegravir  50 mg Oral Q breakfast  . dronabinol  5 mg Oral BID AC  . epoetin (EPOGEN/PROCRIT) injection  10,000 Units Intravenous Q M,W,F-HD  . famotidine  10 mg Oral Q1200  . feeding supplement (ENSURE ENLIVE)  237 mL Oral TID BM  . heparin injection (subcutaneous)  5,000 Units Subcutaneous Q8H  . magnesium oxide  400 mg Oral Daily  . mometasone-formoterol  2 puff Inhalation BID  . multivitamin  1 tablet Oral QHS  . multivitamin with minerals  1 tablet Oral Daily  . sodium chloride flush  5 mL Intracatheter Q8H  . torsemide  40 mg Oral Daily   Continuous Infusions: . sodium chloride 250 mL (05/03/20 0043)  . sodium chloride Stopped (05/12/20 0620)  . piperacillin-tazobactam (ZOSYN)  IV 3.375 g (05/20/20 7062)     Anti-infectives (From admission, onward)   Start     Dose/Rate Route Frequency Ordered Stop   05/19/20 1500  piperacillin-tazobactam (ZOSYN) IVPB 3.375 g        3.375 g 12.5 mL/hr over 240 Minutes Intravenous Every 8 hours 05/19/20 1356      05/18/20 2000  meropenem (MERREM) 1 g in sodium chloride 0.9 % 100 mL IVPB  Status:  Discontinued        1 g 200 mL/hr over 30 Minutes Intravenous Every 12 hours 05/18/20 1657 05/19/20 1323   05/10/20 2200  meropenem (MERREM) 500 mg in sodium chloride 0.9 % 100 mL IVPB  Status:  Discontinued        500 mg 200 mL/hr over 30 Minutes Intravenous Every 12 hours 05/10/20 1324 05/10/20 1328   05/10/20 2200  meropenem (MERREM) 500 mg in sodium chloride 0.9 % 100 mL IVPB  Status:  Discontinued        500 mg 200 mL/hr over 30 Minutes Intravenous Daily 05/10/20 1328 05/18/20 1657   05/09/20 2200  meropenem (MERREM) 1 g in sodium chloride 0.9 % 100 mL IVPB  Status:  Discontinued        1 g 200 mL/hr over 30 Minutes Intravenous Every 12 hours 05/09/20 2121 05/10/20 1324   05/07/20 1300  rilpivirine (EDURANT) tablet 25 mg       "And" Linked Group Details   25 mg Oral Daily with breakfast 05/07/20 1245     05/07/20  1300  dolutegravir (TIVICAY) tablet 50 mg       "And" Linked Group Details   50 mg Oral Daily with breakfast 05/07/20 1245     05/06/20 0956  meropenem (MERREM) 500 mg in sodium chloride 0.9 % 100 mL IVPB  Status:  Discontinued        500 mg 200 mL/hr over 30 Minutes Intravenous Every 24 hours 05/05/20 1018 05/05/20 1637   05/03/20 2000  anidulafungin (ERAXIS) 100 mg in sodium chloride 0.9 % 100 mL IVPB  Status:  Discontinued        100 mg 78 mL/hr over 100 Minutes Intravenous Every 24 hours 05/02/20 2223 05/19/20 1321   05/02/20 2330  anidulafungin (ERAXIS) 200 mg in sodium chloride 0.9 % 200 mL IVPB        200 mg 78 mL/hr over 200 Minutes Intravenous  Once 05/02/20 2223 05/03/20 0455   04/30/20 0800  rilpivirine (EDURANT) tablet 25 mg  Status:  Discontinued       "And" Linked Group Details   25 mg Oral Daily with breakfast 04/29/20 1254 05/02/20 2223   04/30/20 0800  dolutegravir (TIVICAY) tablet 50 mg  Status:  Discontinued       "And" Linked Group Details   50 mg Oral Daily with  breakfast 04/29/20 1254 05/02/20 2223   04/28/20 2200  meropenem (MERREM) 500 mg in sodium chloride 0.9 % 100 mL IVPB  Status:  Discontinued        500 mg 200 mL/hr over 30 Minutes Intravenous Every 12 hours 04/28/20 1546 05/05/20 1018   04/26/20 2200  meropenem (MERREM) 500 mg in sodium chloride 0.9 % 100 mL IVPB  Status:  Discontinued        500 mg 200 mL/hr over 30 Minutes Intravenous Every 8 hours 04/26/20 1258 04/28/20 1546   04/25/20 1700  rilpivirine (EDURANT) tablet 25 mg  Status:  Discontinued        25 mg Oral Daily with breakfast 04/24/20 1636 04/25/20 1111   04/25/20 1200  rilpivirine (EDURANT) tablet 25 mg  Status:  Discontinued        25 mg Oral Daily with breakfast 04/25/20 1111 04/29/20 1254   04/25/20 1000  meropenem (MERREM) 500 mg in sodium chloride 0.9 % 100 mL IVPB  Status:  Discontinued        500 mg 200 mL/hr over 30 Minutes Intravenous Every 12 hours 04/25/20 0935 04/26/20 1258   04/24/20 1700  darunavir-cobicistat (PREZCOBIX) 800-150 MG per tablet 1 tablet  Status:  Discontinued        1 tablet Oral Daily with breakfast 04/24/20 1231 04/24/20 1636   04/24/20 1330  dolutegravir (TIVICAY) tablet 50 mg  Status:  Discontinued        50 mg Oral Daily 04/24/20 1231 04/29/20 1254   04/23/20 2200  meropenem (MERREM) 500 mg in sodium chloride 0.9 % 100 mL IVPB  Status:  Discontinued        500 mg 200 mL/hr over 30 Minutes Intravenous Every 24 hours 04/23/20 1127 04/25/20 0935   04/18/20 2000  levofloxacin (LEVAQUIN) IVPB 750 mg        750 mg 100 mL/hr over 90 Minutes Intravenous  Once 04/18/20 1857 04/19/20 0004   04/18/20 1800  levofloxacin (LEVAQUIN) IVPB 750 mg  Status:  Discontinued        750 mg 100 mL/hr over 90 Minutes Intravenous  Once 04/18/20 1640 04/18/20 1857   04/16/20 2200  anidulafungin (ERAXIS) 100 mg in sodium  chloride 0.9 % 100 mL IVPB        100 mg 78 mL/hr over 100 Minutes Intravenous Every 24 hours 04/16/20 1707 04/22/20 0211   04/15/20 2200   valACYclovir (VALTREX) tablet 500 mg  Status:  Discontinued        500 mg Oral Daily at bedtime 04/15/20 1248 04/16/20 1259   04/15/20 2200  anidulafungin (ERAXIS) 200 mg in sodium chloride 0.9 % 200 mL IVPB        200 mg 78 mL/hr over 200 Minutes Intravenous  Once 04/15/20 2008 04/16/20 0327   04/15/20 2200  meropenem (MERREM) 1 g in sodium chloride 0.9 % 100 mL IVPB  Status:  Discontinued        1 g 200 mL/hr over 30 Minutes Intravenous Every 12 hours 04/15/20 2017 04/23/20 1127   04/15/20 1400  piperacillin-tazobactam (ZOSYN) IVPB 3.375 g  Status:  Discontinued        3.375 g 12.5 mL/hr over 240 Minutes Intravenous Every 8 hours 04/15/20 1300 04/15/20 2006   04/14/20 2200  elvitegravir-cobicistat-emtricitabine-tenofovir (GENVOYA) 150-150-200-10 MG tablet 1 tablet  Status:  Discontinued        1 tablet Oral Daily at bedtime 04/14/20 0235 04/15/20 0957   04/14/20 2200  valACYclovir (VALTREX) tablet 1,000 mg  Status:  Discontinued        1,000 mg Oral Daily at bedtime 04/14/20 0235 04/15/20 1248   04/14/20 1230  cefTRIAXone (ROCEPHIN) 2 g in sodium chloride 0.9 % 100 mL IVPB  Status:  Discontinued        2 g 200 mL/hr over 30 Minutes Intravenous Every 24 hours 04/14/20 1223 04/15/20 1300   04/14/20 1230  metroNIDAZOLE (FLAGYL) IVPB 500 mg  Status:  Discontinued        500 mg 100 mL/hr over 60 Minutes Intravenous Every 8 hours 04/14/20 1223 04/15/20 1636   04/14/20 1030  piperacillin-tazobactam (ZOSYN) IVPB 3.375 g  Status:  Discontinued        3.375 g 12.5 mL/hr over 240 Minutes Intravenous Every 8 hours 04/14/20 0235 04/14/20 1223   04/14/20 0215  piperacillin-tazobactam (ZOSYN) IVPB 4.5 g  Status:  Discontinued        4.5 g 200 mL/hr over 30 Minutes Intravenous  Once 04/14/20 0200 04/14/20 0208   04/14/20 0215  piperacillin-tazobactam (ZOSYN) IVPB 3.375 g        3.375 g 100 mL/hr over 30 Minutes Intravenous  Once 04/14/20 0208 04/14/20 0301             Family  Communication/Anticipated D/C date and plan/Code Status   DVT prophylaxis:      Code Status: Partial Code  Family Communication:  Disposition Plan:    Status is: Inpatient  Remains inpatient appropriate because:IV treatments appropriate due to intensity of illness or inability to take PO and Inpatient level of care appropriate due to severity of illness   Dispo: The patient is from: Home              Anticipated d/c is to: SNF              Anticipated d/c date is: > 3 days              Patient currently is not medically stable to d/c.           Subjective:   Interval events noted.  No complaints.  Abdominal pain is better today.  Objective:    Vitals:   05/19/20 1942 05/19/20 1952 05/19/20  1958 05/20/20 0523  BP: (!) 124/56 (!) 112/98 (!) 103/56 (!) 116/55  Pulse: 87 79 86 82  Resp: 19     Temp: (!) 97.5 F (36.4 C) 97.8 F (36.6 C)  97.9 F (36.6 C)  TempSrc: Oral Oral    SpO2: 98% 100%  97%  Weight:      Height:       No data found.   Intake/Output Summary (Last 24 hours) at 05/20/2020 1016 Last data filed at 05/20/2020 0644 Gross per 24 hour  Intake 229.5 ml  Output 660 ml  Net -430.5 ml   Filed Weights   05/03/20 0547 05/09/20 0951 05/14/20 1349  Weight: 130.9 kg 119 kg 124.3 kg    Exam:   GEN: No acute distress  SKIN: No discharge or skin damage to bilateral buttocks but the lesions on the left buttock are healing EYES: Pale, anicteric ENT: Moist mucous membranes CV: Regular rate and rhythm PULM: Occasional wheezing ABD: Soft, obese, mild lower abdominal tenderness, +JP drain in lower abdomen CNS: Alert and completely oriented.  No focal deficits. EXT: No edema or tenderness     Data Reviewed:   I have personally reviewed following labs and imaging studies:  Labs: Labs show the following:   Basic Metabolic Panel: Recent Labs  Lab 05/16/20 0440 05/16/20 0440 05/16/20 0443 05/17/20 0849 05/17/20 0849 05/18/20 0649  05/18/20 0649 05/19/20 0653 05/20/20 0514  NA 140  --   --  138  --  139  --  138 139  K 3.0*   < >  --  3.4*   < > 3.5   < > 3.3* 3.6  CL 103  --   --  101  --  103  --  101 102  CO2 26  --   --  26  --  28  --  28 29  GLUCOSE 82  --   --  69*  --  79  --  84 76  BUN 30*  --   --  20  --  23*  --  25* 25*  CREATININE 2.46*  --   --  2.10*  --  2.01*  --  1.97* 1.87*  CALCIUM 7.4*  --   --  7.4*  --  7.3*  --  7.2* 7.2*  MG 1.9  --   --   --   --   --   --   --   --   PHOS  --   --  3.4  --   --   --   --   --   --    < > = values in this interval not displayed.   GFR Estimated Creatinine Clearance: 44.2 mL/min (A) (by C-G formula based on SCr of 1.87 mg/dL (H)). Liver Function Tests: Recent Labs  Lab 05/14/20 0604 05/16/20 0440  AST 25 25  ALT 23 24  ALKPHOS 78 81  BILITOT 1.3* 1.0  PROT 5.3* 5.3*  ALBUMIN 1.1* 1.1*   No results for input(s): LIPASE, AMYLASE in the last 168 hours. No results for input(s): AMMONIA in the last 168 hours. Coagulation profile Recent Labs  Lab 05/15/20 0443  INR 1.2    CBC: Recent Labs  Lab 05/16/20 0440 05/17/20 0849 05/18/20 0649 05/19/20 0653 05/20/20 0514  WBC 29.5* 32.4* 32.0* 27.6* 22.9*  NEUTROABS 19.3* 22.3* 22.8* 19.9* 16.4*  HGB 8.0* 7.3* 7.9* 7.6* 7.3*  HCT 24.6* 23.2* 24.4* 23.0* 22.8*  MCV 91.4  92.1 91.4 89.8 94.6  PLT 283 228 251 241 264   Cardiac Enzymes: No results for input(s): CKTOTAL, CKMB, CKMBINDEX, TROPONINI in the last 168 hours. BNP (last 3 results) No results for input(s): PROBNP in the last 8760 hours. CBG: No results for input(s): GLUCAP in the last 168 hours. D-Dimer: No results for input(s): DDIMER in the last 72 hours. Hgb A1c: No results for input(s): HGBA1C in the last 72 hours. Lipid Profile: No results for input(s): CHOL, HDL, LDLCALC, TRIG, CHOLHDL, LDLDIRECT in the last 72 hours. Thyroid function studies: No results for input(s): TSH, T4TOTAL, T3FREE, THYROIDAB in the last 72  hours.  Invalid input(s): FREET3 Anemia work up: No results for input(s): VITAMINB12, FOLATE, FERRITIN, TIBC, IRON, RETICCTPCT in the last 72 hours. Sepsis Labs: Recent Labs  Lab 05/17/20 0849 05/18/20 0649 05/19/20 0653 05/20/20 0514  WBC 32.4* 32.0* 27.6* 22.9*    Microbiology Recent Results (from the past 240 hour(s))  Aerobic/Anaerobic Culture (surgical/deep wound)     Status: None (Preliminary result)   Collection Time: 05/15/20  4:36 PM   Specimen: Abdomen; Abdominal Fluid  Result Value Ref Range Status   Specimen Description   Final    ABDOMEN Performed at [facility], [address], [place], [place] [address]    Special Requests   Final    ABDOMINAL FLUID Performed at [facility], [address] [place], [place] [address]    Gram Stain   Final    RARE WBC PRESENT, PREDOMINANTLY MONONUCLEAR NO ORGANISMS SEEN    Culture   Final    NO GROWTH 4 DAYS NO ANAEROBES ISOLATED; CULTURE IN PROGRESS FOR 5 DAYS Performed at [facility] [address], [place], [place] [address]    Report Status PENDING  Incomplete  Body fluid culture     Status: None   Collection Time: 05/16/20  6:37 PM   Specimen: JP Drain; Body Fluid  Result Value Ref Range Status   Specimen Description   Final    JP DRAINAGE Performed at [facility], [address] [place], [place] [address]    Special Requests   Final    NONE Performed at [facility], [place],  [address]    Gram Stain   Final    FEW WBC PRESENT,BOTH PMN AND MONONUCLEAR ABUNDANT GRAM POSITIVE COCCI MODERATE GRAM NEGATIVE RODS Performed at [facility], [place] [address], [place],  [address]    Culture   Final    ABUNDANT PSEUDOMONAS AERUGINOSA ABUNDANT ENTEROCOCCUS SPECIES    Report Status 05/20/2020 FINAL  Final   Organism ID, Bacteria PSEUDOMONAS AERUGINOSA  Final   Organism ID, Bacteria ENTEROCOCCUS SPECIES  Final      Susceptibility    Enterococcus species - MIC*    AMPICILLIN 16 RESISTANT Resistant     VANCOMYCIN 2 SENSITIVE Sensitive     GENTAMICIN SYNERGY SENSITIVE Sensitive     * ABUNDANT ENTEROCOCCUS SPECIES   Pseudomonas aeruginosa - MIC*    CEFTAZIDIME 4 SENSITIVE Sensitive     CIPROFLOXACIN 1 SENSITIVE Sensitive     GENTAMICIN 8 INTERMEDIATE Intermediate     IMIPENEM >=16 RESISTANT Resistant     PIP/TAZO 8 SENSITIVE Sensitive     * ABUNDANT PSEUDOMONAS AERUGINOSA  Fungus Culture With Stain     Status: Abnormal (Preliminary result)   Collection Time: 05/16/20  6:37 PM   Specimen: JP Drain  Result Value Ref Range Status   Fungus Stain  Final report (A)  Final    Comment: (NOTE) Performed At: [facility] [place], [place] [id number] Rush Farmer MD [id number]    Fungus (Mycology) Culture PENDING  Incomplete   Fungal Source JP DRAINAGE  Final    Comment: Performed at [facility], [address] [place], [place] [address]  Fungus Culture Result     Status: Abnormal   Collection Time: 05/16/20  6:37 PM  Result Value Ref Range Status   Result 1 Yeast observed (A)  Final    Comment: (NOTE) Performed At: [facility] [place], [place] [id number] Rush Farmer MD [id number]     Procedures and diagnostic studies:  No results found.             LOS: 36 days   [place] [employer], advertising on [email or web address]. If 7PM-7AM, please contact night-coverage at [email or web address]     05/20/2020, 10:16 AM

## 2020-05-20 NOTE — Progress Notes (Signed)
[facility]  ROUNDING NOTE   Subjective:   Creatinine 1.97 (2.01) UOP 600 recorded  Patient with no complaints.   Objective:  Vital signs in last 24 hours:  Temp:  [97.5 F (36.4 C)-99.6 F (37.6 C)] 97.9 F (36.6 C) (09/21 0523) Pulse Rate:  [79-87] 82 (09/21 0523) Resp:  [18-19] 19 (09/20 1942) BP: (103-125)/(54-98) 116/55 (09/21 0523) SpO2:  [91 %-100 %] 97 % (09/21 0523)  Weight change:  Filed Weights   05/03/20 0547 05/09/20 0951 05/14/20 1349  Weight: 130.9 kg 119 kg 124.3 kg    Intake/Output: I/O last 3 completed shifts: In: 429.5 [P.O.:220; IV Piggyback:209.5] Out: 660 [Urine:600; Drains:60]   Intake/Output this shift:  No intake/output data recorded.  Physical Exam: General: Resting in bed, appears comfotable  Head: Normocephalic, atraumatic. Moist oral mucosal membranes  Eyes: Anicteric, PERRL  Neck: Supple, trachea midline  Lungs:  Diminished at the bases, Normal and symmetrical respiratory effort  Heart: S1S2 + Regular  Abdomen:  Non distended, non tender  Extremities:  No peripheral edema.  Neurologic: Alert, oriented, speech clear and appropriate  Skin: No acute lesions or rashes  Access: RIJ permcath     Basic Metabolic Panel: Recent Labs  Lab 05/16/20 0440 05/16/20 0440 05/16/20 0443 05/17/20 0849 05/17/20 0849 05/18/20 0649 05/19/20 0653 05/20/20 0514  NA 140  --   --  138  --  139 138 139  K 3.0*  --   --  3.4*  --  3.5 3.3* 3.6  CL 103  --   --  101  --  103 101 102  CO2 26  --   --  26  --  28 28 29   GLUCOSE 82  --   --  69*  --  79 84 76  BUN 30*  --   --  20  --  23* 25* 25*  CREATININE 2.46*  --   --  2.10*  --  2.01* 1.97* 1.87*  CALCIUM 7.4*   < >  --  7.4*   < > 7.3* 7.2* 7.2*  MG 1.9  --   --   --   --   --   --   --   PHOS  --   --  3.4  --   --   --   --   --    < > = values in this interval not displayed.    Liver Function Tests: Recent Labs  Lab 05/14/20 0604 05/16/20 0440  AST 25 25  ALT 23 24   ALKPHOS 78 81  BILITOT 1.3* 1.0  PROT 5.3* 5.3*  ALBUMIN 1.1* 1.1*   No results for input(s): LIPASE, AMYLASE in the last 168 hours. No results for input(s): AMMONIA in the last 168 hours.  CBC: Recent Labs  Lab 05/16/20 0440 05/17/20 0849 05/18/20 0649 05/19/20 0653 05/20/20 0514  WBC 29.5* 32.4* 32.0* 27.6* 22.9*  NEUTROABS 19.3* 22.3* 22.8* 19.9* 16.4*  HGB 8.0* 7.3* 7.9* 7.6* 7.3*  HCT 24.6* 23.2* 24.4* 23.0* 22.8*  MCV 91.4 92.1 91.4 89.8 94.6  PLT 283 228 251 241 264    Cardiac Enzymes: No results for input(s): CKTOTAL, CKMB, CKMBINDEX, TROPONINI in the last 168 hours.  BNP: Invalid input(s): POCBNP  CBG: No results for input(s): GLUCAP in the last 168 hours.  Microbiology: Results for orders placed or performed during the hospital encounter of 04/14/20  SARS Coronavirus 2 by RT PCR (hospital order, performed in [facility] hospital lab)  Nasopharyngeal Nasopharyngeal Swab     Status: None   Collection Time: 04/14/20  2:17 AM   Specimen: Nasopharyngeal Swab  Result Value Ref Range Status   SARS Coronavirus 2 NEGATIVE NEGATIVE Final    Comment: (NOTE) SARS-CoV-2 target nucleic acids are NOT DETECTED.  The SARS-CoV-2 RNA is generally detectable in upper and lower respiratory specimens during the acute phase of infection. The lowest concentration of SARS-CoV-2 viral copies this assay can detect is 250 copies / mL. A negative result does not preclude SARS-CoV-2 infection and should not be used as the sole basis for treatment or other patient management decisions.  A negative result may occur with improper specimen collection / handling, submission of specimen other than nasopharyngeal swab, presence of viral mutation(s) within the areas targeted by this assay, and inadequate number of viral copies (<250 copies / mL). A negative result must be combined with clinical observations, patient history, and epidemiological information.  Fact Sheet for Patients:    StrictlyIdeas.no  Fact Sheet for Healthcare Providers: BankingDealers.co.za  This test is not yet approved or  cleared by the [place] FDA and has been authorized for detection and/or diagnosis of SARS-CoV-2 by FDA under an Emergency Use Authorization (EUA).  This EUA will remain in effect (meaning this test can be used) for the duration of the COVID-19 declaration under Section 564(b)(1) of the Act, 21 U.S.C. section 360bbb-3(b)(1), unless the authorization is terminated or revoked sooner.  Performed at [facility], [address] [place], [place] [address]   CULTURE, BLOOD (ROUTINE X 2) w Reflex to ID Panel     Status: None   Collection Time: 04/14/20  8:14 AM   Specimen: BLOOD  Result Value Ref Range Status   Specimen Description BLOOD LEFT [facility]  Final   Special Requests   Final    BOTTLES DRAWN AEROBIC AND ANAEROBIC Blood Culture adequate volume   Culture   Final    NO GROWTH 5 DAYS Performed at [facility], [address] [place], [place] [address]    Report Status 04/19/2020 FINAL  Final  CULTURE, BLOOD (ROUTINE X 2) w Reflex to ID Panel     Status: None   Collection Time: 04/14/20  8:14 AM   Specimen: BLOOD  Result Value Ref Range Status   Specimen Description BLOOD LEFT WRIST  Final   Special Requests   Final    BOTTLES DRAWN AEROBIC AND ANAEROBIC Blood Culture adequate volume   Culture   Final    NO GROWTH 5 DAYS Performed at [facility], [address], [place], [place] [address]    Report Status 04/19/2020 FINAL  Final  Aerobic/Anaerobic Culture (surgical/deep wound)     Status: None   Collection Time: 04/14/20 11:14 AM   Specimen: Abscess  Result Value Ref Range Status   Specimen Description   Final    ABSCESS Performed at [facility], [address] [place], [place] [address]    Special Requests   Final    NONE Performed at [facility], [place], [place] [address]    Gram Stain   Final    NO WBC SEEN ABUNDANT GRAM NEGATIVE RODS FEW GRAM POSITIVE COCCI FEW GRAM POSITIVE RODS Performed at [facility], [place] [address], [place], [place] [address]    Culture   Final    ABUNDANT KLEBSIELLA PNEUMONIAE ABUNDANT PSEUDOMONAS AERUGINOSA MODERATE ESCHERICHIA COLI Confirmed Extended Spectrum Beta-Lactamase Producer (ESBL).  In bloodstream infections  from ESBL organisms, carbapenems are preferred over piperacillin/tazobactam. They are shown to have a lower risk of mortality. MIXED ANAEROBIC FLORA PRESENT.  CALL LAB IF FURTHER IID REQUIRED.    Report Status 04/20/2020 FINAL  Final   Organism ID, Bacteria KLEBSIELLA PNEUMONIAE  Final   Organism ID, Bacteria PSEUDOMONAS AERUGINOSA  Final   Organism ID, Bacteria ESCHERICHIA COLI  Final      Susceptibility   Escherichia coli - MIC*    AMPICILLIN >=32 RESISTANT Resistant     CEFAZOLIN >=64 RESISTANT Resistant     CEFEPIME 16 RESISTANT Resistant     CEFTAZIDIME RESISTANT Resistant     CEFTRIAXONE >=64 RESISTANT Resistant     CIPROFLOXACIN >=4 RESISTANT Resistant     GENTAMICIN <=1 SENSITIVE Sensitive     IMIPENEM <=0.25 SENSITIVE Sensitive     TRIMETH/SULFA >=320 RESISTANT Resistant     AMPICILLIN/SULBACTAM >=32 RESISTANT Resistant     PIP/TAZO 8 SENSITIVE Sensitive     * MODERATE ESCHERICHIA COLI   Klebsiella pneumoniae - MIC*    AMPICILLIN >=32 RESISTANT Resistant     CEFAZOLIN <=4 SENSITIVE Sensitive     CEFEPIME <=0.12 SENSITIVE Sensitive     CEFTAZIDIME <=1 SENSITIVE Sensitive     CEFTRIAXONE <=0.25 SENSITIVE Sensitive     CIPROFLOXACIN <=0.25 SENSITIVE Sensitive     GENTAMICIN <=1 SENSITIVE Sensitive     IMIPENEM <=0.25 SENSITIVE Sensitive     TRIMETH/SULFA <=20 SENSITIVE Sensitive     AMPICILLIN/SULBACTAM 16 INTERMEDIATE Intermediate     PIP/TAZO 8 SENSITIVE Sensitive     * ABUNDANT KLEBSIELLA PNEUMONIAE   Pseudomonas aeruginosa - MIC*     CEFTAZIDIME 4 SENSITIVE Sensitive     CIPROFLOXACIN <=0.25 SENSITIVE Sensitive     GENTAMICIN 2 SENSITIVE Sensitive     IMIPENEM 2 SENSITIVE Sensitive     PIP/TAZO 8 SENSITIVE Sensitive     CEFEPIME 4 SENSITIVE Sensitive     * ABUNDANT PSEUDOMONAS AERUGINOSA  MRSA PCR Screening     Status: None   Collection Time: 04/14/20  6:19 PM   Specimen: Nasopharyngeal  Result Value Ref Range Status   MRSA by PCR NEGATIVE NEGATIVE Final    Comment:        The GeneXpert MRSA Assay (FDA approved for NASAL specimens only), is one component of a comprehensive MRSA colonization surveillance program. It is not intended to diagnose MRSA infection nor to guide or monitor treatment for MRSA infections. Performed at [facility], [address], [place], [place] [address]   Urine Culture     Status: None   Collection Time: 04/15/20  4:28 AM   Specimen: Urine, Random  Result Value Ref Range Status   Specimen Description   Final    URINE, RANDOM Performed at [facility], [address], [place], [place] [address]    Special Requests   Final    NONE Performed at [facility], [address] [place], [place] [address]    Culture   Final    NO GROWTH Performed at [facility], [place] [address] [place], [place] [address]    Report Status 04/16/2020 FINAL  Final  CULTURE, BLOOD (ROUTINE X 2) w Reflex to ID Panel     Status: None   Collection Time: 04/22/20  6:12 PM   Specimen: BLOOD  Result Value Ref Range Status   Specimen Description BLOOD FOOT  Final   Special Requests   Final    BOTTLES DRAWN AEROBIC AND ANAEROBIC Blood  Culture adequate volume   Culture   Final    NO GROWTH 5 DAYS Performed at [facility], [address] [place], [place] [address]    Report Status 04/27/2020 FINAL  Final  CULTURE, BLOOD (ROUTINE X 2) w Reflex to ID Panel     Status: None   Collection Time: 04/22/20  6:17 PM   Specimen: BLOOD  Result Value Ref Range  Status   Specimen Description BLOOD FOOT  Final   Special Requests   Final    BOTTLES DRAWN AEROBIC AND ANAEROBIC Blood Culture results may not be optimal due to an excessive volume of blood received in culture bottles   Culture   Final    NO GROWTH 5 DAYS Performed at [facility], [address] [place], [place] [address]    Report Status 04/27/2020 FINAL  Final  CULTURE, BLOOD (ROUTINE X 2) w Reflex to ID Panel     Status: None   Collection Time: 05/01/20  2:50 PM   Specimen: BLOOD  Result Value Ref Range Status   Specimen Description BLOOD BLOOD LEFT HAND  Final   Special Requests   Final    BOTTLES DRAWN AEROBIC AND ANAEROBIC Blood Culture adequate volume   Culture   Final    NO GROWTH 5 DAYS Performed at [facility], [address] [place], [place] [address]    Report Status 05/06/2020 FINAL  Final  CULTURE, BLOOD (ROUTINE X 2) w Reflex to ID Panel     Status: None   Collection Time: 05/01/20  2:51 PM   Specimen: BLOOD  Result Value Ref Range Status   Specimen Description BLOOD BLOOD RIGHT HAND  Final   Special Requests   Final    BOTTLES DRAWN AEROBIC AND ANAEROBIC Blood Culture adequate volume   Culture   Final    NO GROWTH 5 DAYS Performed at [facility], [address], [place], [place] [address]    Report Status 05/06/2020 FINAL  Final  Aerobic/Anaerobic Culture (surgical/deep wound)     Status: None   Collection Time: 05/15/20  4:36 PM   Specimen: Abdomen; Abdominal Fluid  Result Value Ref Range Status   Specimen Description   Final    ABDOMEN Performed at [facility], [address], [place], [place] [address]    Special Requests   Final    ABDOMINAL FLUID Performed at [facility], [address] [place], [place] [address]    Gram Stain   Final    RARE WBC PRESENT, PREDOMINANTLY MONONUCLEAR NO ORGANISMS SEEN    Culture   Final    No growth aerobically or anaerobically. Performed at [facility], [place] [address] [place], [place] [address]    Report Status 05/20/2020 FINAL  Final  Body fluid culture     Status: None   Collection Time: 05/16/20  6:37 PM   Specimen: JP Drain; Body Fluid  Result Value Ref Range Status   Specimen Description   Final    JP DRAINAGE Performed at [facility], [address] [place], [place] [address]    Special Requests   Final    NONE Performed at [facility], [place], [place] [address]    Gram Stain   Final    FEW WBC PRESENT,BOTH PMN AND MONONUCLEAR ABUNDANT GRAM POSITIVE COCCI MODERATE GRAM NEGATIVE RODS Performed at [facility], [place] [address], [place], [place] [address]  Culture   Final    ABUNDANT PSEUDOMONAS AERUGINOSA ABUNDANT ENTEROCOCCUS SPECIES    Report Status 05/20/2020 FINAL  Final   Organism ID, Bacteria PSEUDOMONAS AERUGINOSA  Final   Organism ID, Bacteria ENTEROCOCCUS SPECIES  Final      Susceptibility   Enterococcus species - MIC*    AMPICILLIN 16 RESISTANT Resistant     VANCOMYCIN 2 SENSITIVE Sensitive     GENTAMICIN SYNERGY SENSITIVE Sensitive     * ABUNDANT ENTEROCOCCUS SPECIES   Pseudomonas aeruginosa - MIC*    CEFTAZIDIME 4 SENSITIVE Sensitive     CIPROFLOXACIN 1 SENSITIVE Sensitive     GENTAMICIN 8 INTERMEDIATE Intermediate     IMIPENEM >=16 RESISTANT Resistant     PIP/TAZO 8 SENSITIVE Sensitive     * ABUNDANT PSEUDOMONAS AERUGINOSA  Fungus Culture With Stain     Status: Abnormal (Preliminary result)   Collection Time: 05/16/20  6:37 PM   Specimen: JP Drain  Result Value Ref Range Status   Fungus Stain Final report (A)  Final    Comment: (NOTE) Performed At: [facility] [address] [place], [place] [id number] Rush Farmer MD NW:[id number]    Fungus (Mycology) Culture PENDING  Incomplete   Fungal Source JP DRAINAGE  Final    Comment: Performed at [facility], [address] [place], [place] [address]  Fungus Culture Result      Status: Abnormal   Collection Time: 05/16/20  6:37 PM  Result Value Ref Range Status   Result 1 Yeast observed (A)  Final    Comment: (NOTE) Performed At: [facility] [place], [place] [id number] Rush Farmer MD LK:[id number]     Coagulation Studies: No results for input(s): LABPROT, INR in the last 72 hours.  Urinalysis: No results for input(s): COLORURINE, LABSPEC, PHURINE, GLUCOSEU, HGBUR, BILIRUBINUR, KETONESUR, PROTEINUR, UROBILINOGEN, NITRITE, LEUKOCYTESUR in the last 72 hours.  Invalid input(s): APPERANCEUR    Imaging: No results found.   Medications:   . sodium chloride 250 mL (05/03/20 0043)  . sodium chloride Stopped (05/12/20 0620)  . piperacillin-tazobactam (ZOSYN)  IV 3.375 g (05/20/20 6644)   . sodium chloride   Intravenous Once  . alteplase  2 mg Intracatheter Once  . alteplase  2 mg Intracatheter Once  . amiodarone  200 mg Oral Daily  . Chlorhexidine Gluconate Cloth  6 each Topical Daily  . dextromethorphan-guaiFENesin  10 mL Oral Q8H  . rilpivirine  25 mg Oral Q breakfast   And  . dolutegravir  50 mg Oral Q breakfast  . dronabinol  5 mg Oral BID AC  . epoetin (EPOGEN/PROCRIT) injection  10,000 Units Intravenous Q M,W,F-HD  . famotidine  10 mg Oral Q1200  . feeding supplement (ENSURE ENLIVE)  237 mL Oral TID BM  . heparin injection (subcutaneous)  5,000 Units Subcutaneous Q8H  . magnesium oxide  400 mg Oral Daily  . mometasone-formoterol  2 puff Inhalation BID  . multivitamin  1 tablet Oral QHS  . multivitamin with minerals  1 tablet Oral Daily  . sodium chloride flush  5 mL Intracatheter Q8H  . torsemide  40 mg Oral Daily   sodium chloride, acetaminophen **OR** acetaminophen, albuterol, ALPRAZolam, bisacodyl, diphenhydrAMINE, docusate sodium, fentaNYL, heparin NICU/SCN flush, HYDROmorphone (DILAUDID) injection, iohexol, ipratropium-albuterol, magic mouthwash **AND** lidocaine, magnesium hydroxide, menthol-cetylpyridinium,  metoprolol tartrate, oxyCODONE-acetaminophen, phenol, simethicone  Assessment/ Plan:  [name] is a 59 y.o.  female with HIV, COPD, HTN , GERD, who is admitted for Dyspnea and  colonic diverticular abcess.    1. AKI requiring hemodialysis. Last hemodialysis treatment was 9/17. No acute indication for dialysis today.  Baseline creatinine of 1 with normal GFR on 11/08/2019. Bland urine.  - Monitor for dialysis need daily.   2. Anemia of CKD: Required blood transfusions during the admission - Received  10,000 units epogen with dialysis    3. Hypertension - torsemide    LOS: 36 Caelin Rayl 9/21/202112:29 PM

## 2020-05-20 NOTE — Progress Notes (Signed)
Nutrition Follow-up  DOCUMENTATION CODES:   Morbid obesity  INTERVENTION:   Ensure Enlive po TID, each supplement provides 350 kcal and 20 grams of protein  Magic cup TID with meals, each supplement provides 290 kcal and 9 grams of protein  Rena-vit daily  MVI daily   Assist with meals   NUTRITION DIAGNOSIS:   Increased nutrient needs related to chronic illness (COPD, new HD) as evidenced by estimated needs. -ongoing  GOAL:   Patient will meet greater than or equal to 90% of their needs -progressing   MONITOR:   PO intake, Supplement acceptance, Labs, Weight trends, Skin, I & O's  ASSESSMENT:   59 y/o female with h/o HIV, COPD and HTN who is admitted with septic shock secondary to perforated diverticulitis and peridiverticular abscess s/p JP drain on 04/14/20 and AKI requiring temporary HD  8/18- first HD treatment  8/27 - RIJ permacath placed  Pt with slightly improved appetite and oral intake after addition of appetite stimulant. It is difficult to determine patient's oral intake with any certainty as pt is a poor historian and there is only one documented meal intake in chart of 40% from breakfast yesterday. Per RN, pt is refusing most supplements. Per chart, pt is fairly weight stable since admit. RD has discussed multiple times with different MDs and RNs who all feel as though pt would not tolerate a nasogastric feeding tube or PICC line and TPN. Drains with 30ml output. HD on hold currently. Recommend encourage intake of meals and supplements; family can be encouraged to bring food from home also.   Medications reviewed and include: marinol, epoetin, pepcid, Mg oxide, rena-vite, MVI, torsemide, zosyn  Labs reviewed: BUN 25(H), creat 1.87(H) Wbc- 22.9(H), Hgb 7.3(L), Hct 22.8(L)  Diet Order:   Diet Order            DIET DYS 3 Room service appropriate? Yes; Fluid consistency: Thin  Diet effective now                EDUCATION NEEDS:   No education needs have  been identified at this time  Skin:  Skin Assessment: Reviewed RN Assessment (ecchymosis, MASD, incision abdomen)  Last BM:  9/21- type 7  Height:   Ht Readings from Last 1 Encounters:  04/22/20 5\' 6"  (1.676 m)    Weight:   Wt Readings from Last 1 Encounters:  05/14/20 124.3 kg    Ideal Body Weight:  59 kg  BMI:  Body mass index is 44.24 kg/m.  Estimated Nutritional Needs:   Kcal:  2300-2600kcal/day  Protein:  >120g/day  Fluid:  UOP +1L  Koleen Distance MS, RD, LDN Please refer to [facility] for RD and/or RD on-call/weekend/after hours pager

## 2020-05-21 DIAGNOSIS — K5720 Diverticulitis of large intestine with perforation and abscess without bleeding: Secondary | ICD-10-CM | POA: Diagnosis not present

## 2020-05-21 DIAGNOSIS — R6521 Severe sepsis with septic shock: Secondary | ICD-10-CM | POA: Diagnosis not present

## 2020-05-21 DIAGNOSIS — G9341 Metabolic encephalopathy: Secondary | ICD-10-CM | POA: Diagnosis not present

## 2020-05-21 DIAGNOSIS — A419 Sepsis, unspecified organism: Secondary | ICD-10-CM | POA: Diagnosis not present

## 2020-05-21 LAB — BASIC METABOLIC PANEL
Anion gap: 11 (ref 5–15)
BUN: 25 mg/dL — ABNORMAL HIGH (ref 6–20)
CO2: 28 mmol/L (ref 22–32)
Calcium: 7 mg/dL — ABNORMAL LOW (ref 8.9–10.3)
Chloride: 101 mmol/L (ref 98–111)
Creatinine, Ser: 1.89 mg/dL — ABNORMAL HIGH (ref 0.44–1.00)
GFR calc Af Amer: 33 mL/min — ABNORMAL LOW (ref >60)
GFR calc non Af Amer: 29 mL/min — ABNORMAL LOW (ref >60)
Glucose, Bld: 62 mg/dL — ABNORMAL LOW (ref 70–99)
Potassium: 3.3 mmol/L — ABNORMAL LOW (ref 3.5–5.1)
Sodium: 140 mmol/L (ref 135–145)

## 2020-05-21 LAB — CBC WITH DIFFERENTIAL/PLATELET
Abs Immature Granulocytes: 0.58 10*3/uL — ABNORMAL HIGH (ref 0.00–0.07)
Basophils Absolute: 0.1 10*3/uL (ref 0.0–0.1)
Basophils Relative: 1 %
Eosinophils Absolute: 0.2 10*3/uL (ref 0.0–0.5)
Eosinophils Relative: 1 %
HCT: 23 % — ABNORMAL LOW (ref 36.0–46.0)
Hemoglobin: 7.8 g/dL — ABNORMAL LOW (ref 12.0–15.0)
Immature Granulocytes: 3 %
Lymphocytes Relative: 10 %
Lymphs Abs: 2 10*3/uL (ref 0.7–4.0)
MCH: 31 pg (ref 26.0–34.0)
MCHC: 33.9 g/dL (ref 30.0–36.0)
MCV: 91.3 fL (ref 80.0–100.0)
Monocytes Absolute: 2.1 10*3/uL — ABNORMAL HIGH (ref 0.1–1.0)
Monocytes Relative: 11 %
Neutro Abs: 14.1 10*3/uL — ABNORMAL HIGH (ref 1.7–7.7)
Neutrophils Relative %: 74 %
Platelets: 295 10*3/uL (ref 150–400)
RBC: 2.52 MIL/uL — ABNORMAL LOW (ref 3.87–5.11)
RDW: 20 % — ABNORMAL HIGH (ref 11.5–15.5)
WBC: 19 10*3/uL — ABNORMAL HIGH (ref 4.0–10.5)
nRBC: 0.2 % (ref 0.0–0.2)

## 2020-05-21 MED ORDER — LINEZOLID 600 MG PO TABS
600.0000 mg | ORAL_TABLET | Freq: Two times a day (BID) | ORAL | Status: DC
  Administered 2020-05-21 – 2020-05-22 (×4): 600 mg via ORAL
  Filled 2020-05-21 (×6): qty 1

## 2020-05-21 MED ORDER — GERHARDT'S BUTT CREAM
TOPICAL_CREAM | Freq: Every day | CUTANEOUS | Status: DC
  Filled 2020-05-21: qty 1

## 2020-05-21 MED ORDER — SODIUM CHLORIDE 0.9 % IV SOLN
2.0000 g | Freq: Two times a day (BID) | INTRAVENOUS | Status: DC
  Administered 2020-05-21 – 2020-05-22 (×3): 2 g via INTRAVENOUS
  Filled 2020-05-21 (×6): qty 2

## 2020-05-21 MED ORDER — POTASSIUM CHLORIDE 20 MEQ PO PACK
40.0000 meq | PACK | Freq: Once | ORAL | Status: AC
  Administered 2020-05-21: 40 meq via ORAL
  Filled 2020-05-21: qty 2

## 2020-05-21 MED ORDER — MIRTAZAPINE 15 MG PO TABS
15.0000 mg | ORAL_TABLET | Freq: Every day | ORAL | Status: DC
  Administered 2020-05-21 – 2020-05-22 (×2): 15 mg via ORAL
  Filled 2020-05-21 (×2): qty 1

## 2020-05-21 MED ORDER — METRONIDAZOLE 500 MG PO TABS
500.0000 mg | ORAL_TABLET | Freq: Three times a day (TID) | ORAL | Status: DC
  Administered 2020-05-21 – 2020-05-22 (×4): 500 mg via ORAL
  Filled 2020-05-21 (×8): qty 1

## 2020-05-21 NOTE — Progress Notes (Signed)
PT Cancellation Note  Patient Details Name: Melanie Bowen MRN: [id number] DOB: 1961/04/20   Cancelled Treatment:    Reason Eval/Treat Not Completed: Patient declined PT services this date secondary to "just got my pain medicine". When asked pt reported that she was not in any pain but continued to decline PT services and requested to try back later this date.  Will attempt to see pt at a future date/time as able and as medically appropriate.     Linus Salmons PT, DPT 05/21/20, 2:02 PM

## 2020-05-21 NOTE — Progress Notes (Signed)
ID    Date of Admission:  04/14/2020    Meropenem 8/17>>9/6 9/11>>9/20 anidulafungin 8/17>>04/22/20 9/3>>9/20  Zosyn 9/20>9/22  Ceftazidime 9/22 Flagyl 9/22 Linezolid 9/22    ID: Melanie Bowen is a 59 y.o. female  Principal Problem:   Severe sepsis with septic shock ([place]) Active Problems:   HTN (hypertension)   HIV (human immunodeficiency virus infection) ([place])   Diverticulitis of large intestine with abscess without bleeding   Abscess of sigmoid colon due to diverticulitis   Acute renal failure (ARF) ([place])   Hypotension   Diverticulosis   Acute respiratory distress   DNR (do not resuscitate) discussion   Palliative care by specialist   Dyspnea   Goals of care, counseling/discussion   Pressure injury of skin    Subjective: Pt c/o pain at the gluteal area when cream was applied for macerated skin Over all she is more alert She is refusing PT  Medications:  . sodium chloride   Intravenous Once  . alteplase  2 mg Intracatheter Once  . alteplase  2 mg Intracatheter Once  . amiodarone  200 mg Oral Daily  . Chlorhexidine Gluconate Cloth  6 each Topical Daily  . dextromethorphan-guaiFENesin  10 mL Oral Q8H  . rilpivirine  25 mg Oral Q breakfast   And  . dolutegravir  50 mg Oral Q breakfast  . dronabinol  5 mg Oral BID AC  . epoetin (EPOGEN/PROCRIT) injection  10,000 Units Intravenous Q M,W,F-HD  . famotidine  10 mg Oral Q1200  . feeding supplement (ENSURE ENLIVE)  237 mL Oral TID BM  . Gerhardt's butt cream   Topical Daily  . heparin injection (subcutaneous)  5,000 Units Subcutaneous Q8H  . mirtazapine  15 mg Oral QHS  . mometasone-formoterol  2 puff Inhalation BID  . multivitamin  1 tablet Oral QHS  . multivitamin with minerals  1 tablet Oral Daily  . sodium chloride flush  5 mL Intracatheter Q8H  . torsemide  40 mg Oral Daily    Objective: Vital signs in last 24 hours: Temp:  [97.7 F (36.5 C)-98.2 F (36.8 C)] 97.8 F (36.6 C) (09/22 1054) Pulse  Rate:  [87-93] 93 (09/22 1054) Resp:  [20] 20 (09/22 1054) BP: (105-110)/(52-65) 107/65 (09/22 1054) SpO2:  [97 %-100 %] 99 % (09/22 1054)  PHYSICAL EXAM:  General: Alert,not very  Cooperative, Head: Normocephalic, without obvious abnormality, atraumatic. Eyes: Conjunctivae clear, anicteric sclerae. Pupils are equal ENT Nares normal. No drainage or sinus tenderness. Lips, mucosa, and tongue normal. No Thrush Neck: Supple, symmetrical, no adenopathy, thyroid: non tender no carotid bruit and no JVD. Back:gluteal area- macerated skin Lungs: b/l air entry Decreased bases Heart: Regular rate and rhythm, no murmur, rub or gallop. Abdomen: Soft,left lower quadrant drain- greenish fluid Extremities: skin over the palms and soles pelaing but no erythema or blisters  Skin: No rashes or lesions. Or bruising Lymph: Cervical, supraclavicular normal. Neurologic: Grossly non-focal  Lab Results Recent Labs    05/20/20 0514 05/21/20 0613  WBC 22.9* 19.0*  HGB 7.3* 7.8*  HCT 22.8* 23.0*  NA 139 140  K 3.6 3.3*  CL 102 101  CO2 29 28  BUN 25* 25*  CREATININE 1.87* 1.89*   Microbiology:  Studies/Results: No results found.   Assessment/Plan: Septic shocksecondary to perforated diverticulitisand peridiverticular abscess. pseudomonas, kleb andESBLe.coli in the culture. JP drain inserted on 04/14/20. klebsiella, pseudomonas and ESBL e.coli in fluid culture-  appropriate antibiotics since 04/14/20- see above  - CT abdomen done on  9/13 shows pockets of collection andLoculated collection of extraluminal gas adjacent to the terminal Ileum. Drain upsized on 05/15/20-feculent drainage Culture enterococcus & pseudomonas which is R to meropenem  DC meropenem and changed to zosyn DC anidulafungin Because of PCN resistant enterococcu will DC zosyn and change to ceftazidime- will add flagyl and po linezolid. As leucocytosis improves and after 10 days of adequate source control with drain  will DC antibiotics    Encephalopathy-metabolic -much improved  Hypoxic resp failureon presentation-needed intubation for a few days s/p extubation Rigorousincentive spirometry Chest PT because of secretions and atelectasis   AKI due to septic shock/contrast nephropathy-improving was ondialysis which is on hold,   Anasarca- better  HIV- well controlled in jan 2021 her Vl <20 and cd4 >1000.  On 05/03/20 Vl is < 20 [id number] ( 50%)On dolutegravir and rilpivirine -   Afib on amiodarone  Needs PT Discussed the management withpatient and care team

## 2020-05-21 NOTE — Progress Notes (Signed)
PROGRESS NOTE    Melanie Bowen  NMM:[id number] DOB: 10-31-1960 DOA: 04/14/2020 PCP: Theotis Burrow, MD   Brief Narrative:   Melanie Bowen is a 59 y.o. female with medical history significant for hypertension, diverticulosis, enteric infection, morbid obesity, GERD,COPD, HIV infection,history of stroke,asthma,depression, seizures enzymes with acute onset of abdominal pain nausea vomitingandconstipation.  She was admitted to the hospital for septic shock secondary to acute diverticulitis with diverticular abscess. She was treated with IV fluids and empiric IV antimicrobial therapy. She required vasopressors for septic shock. She was seen by interventional radiologist and CT-guided drainage was done by IR on 04/14/2020. Fluid culture showed Pseudomonas aeruginosa, Klebsiella pneumonia and E. coli. Septic shock was complicated by acute kidney injury, acute liver failure, acute metabolic encephalopathy and acute hypoxemic respiratory failure. She was seen by the nephrologist for AKI. Patient was started on hemodialysis for AKI. She developed atrial fibrillation with RVR. She was seen in consultation by the cardiologist as well.  She remains on amiodarone for atrial fibrillation.  Because of persistent leukocytosis and recurrent fever, patient underwent upsizing and repositioning of indwelling lower abdominal drain from 12 [place] to 14 [place] on 05/15/2020.  There was concern for suspected perforation of diverticulum on imaging.  She has been transfused with about 4 units of packed red blood cells thus far for severe anemia.  Surgery following.  Appears to be slowly improving.   Assessment & Plan:   Principal Problem:   Severe sepsis with septic shock (HCC) Active Problems:   HTN (hypertension)   HIV (human immunodeficiency virus infection) ([place])   Diverticulitis of large intestine with abscess without bleeding   Abscess of sigmoid colon due to diverticulitis    Acute renal failure (ARF) ([place])   Hypotension   Diverticulosis   Acute respiratory distress   DNR (do not resuscitate) discussion   Palliative care by specialist   Dyspnea   Goals of care, counseling/discussion   Pressure injury of skin  Acute sigmoid diverticulitis and diverticular abscess s/p CT-guided drainage by IR on 04/14/2020. Fluid cultures with Klebsiella pneumonia, Pseudomonas, E. Coli Appears to be slowly improving Responding to drain upsizing Plan: Drain management per general surgery Currently on Zyvox and Zosyn, will continue Anidulafungin was discontinued on 05/19/2020.  (of note, patient had IV meropenem from 04/15/2020 to 05/06/2020 and meropenem was restarted on 05/09/2020 and discontinued on 05/19/2020.  She also had a anidulafungin from 04/15/2020 to 04/22/2020 and this was restarted on 05/02/2020 and discontinued on 05/19/2020)  Septic shock Resolved Antibiotics as above  Acute anemia Multifactorial in the setting of poor p.o. intake and acute illness Status post transfusion of packed red blood cells on 05/01/2020, 05/04/2020, 05/09/2020, 05/15/2020 Monitor H&H daily, transfuse as necessary  Acute kidney injury requiring hemodialysis Hyperkalemia, resolved Metabolic acidosis, resolved Renal function is improved substantially Nephrology following but no current plans for i ongoing hemodialysis Monitor urine output Monitor potassium frequently  Atrial fibrillation with rapid ventricular response Currently rate and rhythm controlled on amiodarone Defer anticoagulation at this time given acute illness  COPD exacerbation, resolved Acute hypoxemic respiratory failure, resolved Vital signs per unit protocol Oxygen as needed  HIV infection Continue current antiretrovirals  Acute liver failure, resolved In the setting of acute illness  Acute metabolic/toxic encephalopathy Mental status at baseline currently   DVT prophylaxis: Subcutaneous heparin Code Status:  Partial Family Communication: None today Disposition Plan: Status is: Inpatient  Remains inpatient appropriate because:Inpatient level of care appropriate due to severity of illness  Dispo: The patient is from: Home              Anticipated d/c is to: SNF              Anticipated d/c date is: > 3 days              Patient currently is not medically stable to d/c.   Consultants:   General surgery  Infectious disease  Nephrology  Procedures:   See above  Antimicrobials:   Zyvox  Zosyn   Subjective: Seen and examined.  Complains of irritation to buttock region  Objective: Vitals:   05/20/20 1313 05/20/20 2009 05/21/20 0536 05/21/20 1054  BP: 104/66 (!) 105/52 (!) 110/57 107/65  Pulse: 98 91 87 93  Resp: 18 20 20 20   Temp: 97.9 F (36.6 C) 98.2 F (36.8 C) 97.7 F (36.5 C) 97.8 F (36.6 C)  TempSrc: Oral Oral Oral Oral  SpO2: 99% 100% 97% 99%  Weight:      Height:        Intake/Output Summary (Last 24 hours) at 05/21/2020 1406 Last data filed at 05/21/2020 0600 Gross per 24 hour  Intake 337.92 ml  Output 60 ml  Net 277.92 ml   Filed Weights   05/03/20 0547 05/09/20 0951 05/14/20 1349  Weight: 130.9 kg 119 kg 124.3 kg    Examination:  General exam: No acute distress.  Appears chronically ill Respiratory system: Decreased at bases.  Poor respiratory effort.  Room air  cardiovascular system: Regular rate, irregular rhythm, no murmurs, no pedal edema Gastrointestinal system: Obese, nontender, nondistended, positive bowel sounds, status post abdominal drain  Central nervous system: Alert and oriented. No focal neurological deficits. Extremities: Diffusely decreased power bilaterally Skin: No rashes, lesions or ulcers Psychiatry: Judgement and insight appear normal. Mood & affect appropriate.     Data Reviewed: I have personally reviewed following labs and imaging studies  CBC: Recent Labs  Lab 05/17/20 0849 05/18/20 0649 05/19/20 0653  05/20/20 0514 05/21/20 0613  WBC 32.4* 32.0* 27.6* 22.9* 19.0*  NEUTROABS 22.3* 22.8* 19.9* 16.4* 14.1*  HGB 7.3* 7.9* 7.6* 7.3* 7.8*  HCT 23.2* 24.4* 23.0* 22.8* 23.0*  MCV 92.1 91.4 89.8 94.6 91.3  PLT 228 251 241 264 094   Basic Metabolic Panel: Recent Labs  Lab 05/16/20 0440 05/16/20 0440 05/16/20 0443 05/17/20 0849 05/18/20 0649 05/19/20 0653 05/20/20 0514 05/21/20 0613  NA 140   < >  --  138 139 138 139 140  K 3.0*   < >  --  3.4* 3.5 3.3* 3.6 3.3*  CL 103   < >  --  101 103 101 102 101  CO2 26   < >  --  26 28 28 29 28   GLUCOSE 82   < >  --  69* 79 84 76 62*  BUN 30*   < >  --  20 23* 25* 25* 25*  CREATININE 2.46*   < >  --  2.10* 2.01* 1.97* 1.87* 1.89*  CALCIUM 7.4*   < >  --  7.4* 7.3* 7.2* 7.2* 7.0*  MG 1.9  --   --   --   --   --   --   --   PHOS  --   --  3.4  --   --   --   --   --    < > = values in this interval not displayed.   GFR: Estimated Creatinine Clearance: 43.7 mL/min (A) (  by C-G formula based on SCr of 1.89 mg/dL (H)). Liver Function Tests: Recent Labs  Lab 05/16/20 0440  AST 25  ALT 24  ALKPHOS 81  BILITOT 1.0  PROT 5.3*  ALBUMIN 1.1*   No results for input(s): LIPASE, AMYLASE in the last 168 hours. No results for input(s): AMMONIA in the last 168 hours. Coagulation Profile: Recent Labs  Lab 05/15/20 0443  INR 1.2   Cardiac Enzymes: No results for input(s): CKTOTAL, CKMB, CKMBINDEX, TROPONINI in the last 168 hours. BNP (last 3 results) No results for input(s): PROBNP in the last 8760 hours. HbA1C: No results for input(s): HGBA1C in the last 72 hours. CBG: No results for input(s): GLUCAP in the last 168 hours. Lipid Profile: No results for input(s): CHOL, HDL, LDLCALC, TRIG, CHOLHDL, LDLDIRECT in the last 72 hours. Thyroid Function Tests: No results for input(s): TSH, T4TOTAL, FREET4, T3FREE, THYROIDAB in the last 72 hours. Anemia Panel: No results for input(s): VITAMINB12, FOLATE, FERRITIN, TIBC, IRON, RETICCTPCT in the  last 72 hours. Sepsis Labs: No results for input(s): PROCALCITON, LATICACIDVEN in the last 168 hours.  Recent Results (from the past 240 hour(s))  Aerobic/Anaerobic Culture (surgical/deep wound)     Status: None   Collection Time: 05/15/20  4:36 PM   Specimen: Abdomen; Abdominal Fluid  Result Value Ref Range Status   Specimen Description   Final    ABDOMEN Performed at [facility], [address], [place], [place] [address]    Special Requests   Final    ABDOMINAL FLUID Performed at [facility], [address] [place], [place] [address]    Gram Stain   Final    RARE WBC PRESENT, PREDOMINANTLY MONONUCLEAR NO ORGANISMS SEEN    Culture   Final    No growth aerobically or anaerobically. Performed at [facility], [place] [address] [place], [place] [address]    Report Status 05/20/2020 FINAL  Final  Body fluid culture     Status: None   Collection Time: 05/16/20  6:37 PM   Specimen: JP Drain; Body Fluid  Result Value Ref Range Status   Specimen Description   Final    JP DRAINAGE Performed at [facility], [address] [place], [place] [address]    Special Requests   Final    NONE Performed at [facility], [place], [place] [address]    Gram Stain   Final    FEW WBC PRESENT,BOTH PMN AND MONONUCLEAR ABUNDANT GRAM POSITIVE COCCI MODERATE GRAM NEGATIVE RODS Performed at [facility], [place] [address] [place], [place] [address]    Culture   Final    ABUNDANT PSEUDOMONAS AERUGINOSA ABUNDANT ENTEROCOCCUS SPECIES    Report Status 05/20/2020 FINAL  Final   Organism ID, Bacteria PSEUDOMONAS AERUGINOSA  Final   Organism ID, Bacteria ENTEROCOCCUS SPECIES  Final      Susceptibility   Enterococcus species - MIC*    AMPICILLIN 16 RESISTANT Resistant     VANCOMYCIN 2 SENSITIVE Sensitive     GENTAMICIN SYNERGY SENSITIVE Sensitive     * ABUNDANT ENTEROCOCCUS SPECIES   Pseudomonas aeruginosa - MIC*    CEFTAZIDIME  4 SENSITIVE Sensitive     CIPROFLOXACIN 1 SENSITIVE Sensitive     GENTAMICIN 8 INTERMEDIATE Intermediate     IMIPENEM >=16 RESISTANT Resistant     PIP/TAZO 8 SENSITIVE Sensitive     * ABUNDANT PSEUDOMONAS AERUGINOSA  Fungus Culture With Stain     Status: Abnormal (  Preliminary result)   Collection Time: 05/16/20  6:37 PM   Specimen: JP Drain  Result Value Ref Range Status   Fungus Stain Final report (A)  Final    Comment: (NOTE) Performed At: [facility] [place], [place] [id number] Rush Farmer MD [id number]    Fungus (Mycology) Culture PENDING  Incomplete   Fungal Source JP DRAINAGE  Final    Comment: Performed at [facility], [address] [place], [place] [address]  Fungus Culture Result     Status: Abnormal   Collection Time: 05/16/20  6:37 PM  Result Value Ref Range Status   Result 1 Yeast observed (A)  Final    Comment: (NOTE) Performed At: [facility] [place], [place] [id number] Rush Farmer MD [id number]          Radiology Studies: No results found.      Scheduled Meds: . sodium chloride   Intravenous Once  . alteplase  2 mg Intracatheter Once  . alteplase  2 mg Intracatheter Once  . amiodarone  200 mg Oral Daily  . Chlorhexidine Gluconate Cloth  6 each Topical Daily  . dextromethorphan-guaiFENesin  10 mL Oral Q8H  . rilpivirine  25 mg Oral Q breakfast   And  . dolutegravir  50 mg Oral Q breakfast  . dronabinol  5 mg Oral BID AC  . epoetin (EPOGEN/PROCRIT) injection  10,000 Units Intravenous Q M,W,F-HD  . famotidine  10 mg Oral Q1200  . feeding supplement (ENSURE ENLIVE)  237 mL Oral TID BM  . Gerhardt's butt cream   Topical Daily  . heparin injection (subcutaneous)  5,000 Units Subcutaneous Q8H  . linezolid  600 mg Oral Q12H  . mirtazapine  15 mg Oral QHS  . mometasone-formoterol  2 puff Inhalation BID  . multivitamin  1 tablet Oral QHS  . multivitamin with minerals  1 tablet  Oral Daily  . sodium chloride flush  5 mL Intracatheter Q8H  . torsemide  40 mg Oral Daily   Continuous Infusions: . sodium chloride 250 mL (05/03/20 0043)  . sodium chloride Stopped (05/12/20 0620)  . piperacillin-tazobactam (ZOSYN)  IV 3.375 g (05/21/20 0555)     LOS: 37 days    Time spent: 35 minutes    Sidney Ace, MD [facility] Pager [phone]  If 7PM-7AM, please contact night-coverage 05/21/2020, 2:06 PM

## 2020-05-21 NOTE — Progress Notes (Addendum)
[facility] SURGICAL PROGRESS NOTE (cpt [phone])  Hospital Day(s): 37.   Interval History:  Patient seen and examined no acute events or new complaints overnight.  Patient reports she has burning pain on "her bottom" She denies fever, chills, nausea, emesis. Labs not drawn this morning, leukocytosis had been improving over the last 72 hours Has not required dialysis since 09/17, UO is unmeasured in last 24 hours Drain output 60 ccs, seropurulent She did get new Cx from drain on 09/17; growing pseudomonas & enterococcus; susceptibilities reviewed Tolerating regular diet, no issues Having bowel function Working with therapies; recommending SNF  Review of Systems:  Constitutional: denies fever, chills  HEENT: denies cough or congestion  Respiratory: denies any shortness of breath  Cardiovascular: denies chest pain or palpitations  Gastrointestinal: *denies abdominal pain, N/V, or diarrhea/and bowel function as per interval history Genitourinary: denies burning with urination or urinary frequency Musculoskeletal: denies pain, decreased motor or sensation Integumentary: + Burning, pressure injury to buttock  Vital signs in last 24 hours: [min-max] current  Temp:  [97.7 F (36.5 C)-98.2 F (36.8 C)] 97.7 F (36.5 C) (09/22 0536) Pulse Rate:  [87-98] 87 (09/22 0536) Resp:  [18-20] 20 (09/22 0536) BP: (104-110)/(52-66) 110/57 (09/22 0536) SpO2:  [97 %-100 %] 97 % (09/22 0536)     Height: 5\' 6"  (167.6 cm) Weight: 124.3 kg BMI (Calculated): 44.26   Intake/Output last 2 shifts:  09/21 0701 - 09/22 0700 In: 337.9 [P.O.:220; IV Piggyback:117.9] Out: 60 [Drains:60]   Physical Exam:  Constitutional: alert, cooperative and no distress HENT: normocephalic without obvious abnormality  Eyes: PERRL, EOM's grossly intact and symmetric  Respiratory: breathing non-labored at rest, on [place] Cardiovascular: regular rate and sinus rhythm  Gastrointestinal: soft, tenderness over  suprapubic drain site, I am unable to illicit any additional tenderness, and non-distended, no rebound/guarding, certainly without evidence of peritonitis, JP in LLQ output is seropurulent Musculoskeletal: no edema or wounds, motor and sensation grossly intact, NT    Labs:  CBC Latest Ref Rng & Units 05/20/2020 05/19/2020 05/18/2020  WBC 4.0 - 10.5 K/uL 22.9(H) 27.6(H) 32.0(H)  Hemoglobin 12.0 - 15.0 g/dL 7.3(L) 7.6(L) 7.9(L)  Hematocrit 36 - 46 % 22.8(L) 23.0(L) 24.4(L)  Platelets 150 - 400 K/uL 264 241 251   CMP Latest Ref Rng & Units 05/20/2020 05/19/2020 05/18/2020  Glucose 70 - 99 mg/dL 76 84 79  BUN 6 - 20 mg/dL 25(H) 25(H) 23(H)  Creatinine 0.44 - 1.00 mg/dL 1.87(H) 1.97(H) 2.01(H)  Sodium 135 - 145 mmol/L 139 138 139  Potassium 3.5 - 5.1 mmol/L 3.6 3.3(L) 3.5  Chloride 98 - 111 mmol/L 102 101 103  CO2 22 - 32 mmol/L 29 28 28   Calcium 8.9 - 10.3 mg/dL 7.2(L) 7.2(L) 7.3(L)  Total Protein 6.5 - 8.1 g/dL - - -  Total Bilirubin 0.3 - 1.2 mg/dL - - -  Alkaline Phos 38 - 126 U/L - - -  AST 15 - 41 U/L - - -  ALT 0 - 44 U/L - - -     Imaging studies: No new pertinent imaging studies   Assessment/Plan: (ICD-10's: K47.20) 59 y.o. 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 She will likely require this drain for some time as an outpatient. If her leukocytosis continues to improve, we may be moving closer to disposition in the next  few days/week, no rush, ensure we have adequate control   - Okay to continue diet              - Continue IV Abx (Zosyn, day 2); ID following and appreciate assistance; initial Cx with pseudomonas, kleb and ESBL e.coli, most recent cx (09/17) with pseudomonas & enterococcus              - Monitor abdominal examination; leukocytosis              - Pain control prn; antiemetics prn              - Appreciate nephrology assistance and recommendations             - Continue to work with therapies; suspect she will need significant rehab             - Further management per primary service; we will continue to follow     All of the above findings and recommendations were discussed with the patient, patient's family (at bedside), and the medical team, and all of patient's and her family's questions were answered to their expressed satisfaction.  -- Edison Simon, PA-C [facility] 05/21/2020, 7:41 AM [phone] M-F: 7am - 4pm  I have evaluated this patient and in conjunction with Mr. Olean Ree have established the plan noted.  Ronny Bacon, M.D., [facility]  05/21/2020 ; 12:40 PM

## 2020-05-21 NOTE — Progress Notes (Signed)
Pharmacy Antibiotic Note  Melanie Bowen is a 59 y.o. female admitted on 04/14/2020 with intra-abdominal infection.  Pharmacy has been consulted for ceftazidime dosing.   - 9/17 drain culture with Pseudomonas resistant to carbapenem.  Enterococcus isolated, resistant to ampicillin. - renal function improving (assessing need for HD daily - currently not required)   Plan: Ceftazidime 2g q12h considering intra-abdominal infection and CrCl between 30 and 50  - monitor renal function throughout therapy  Height: 5\' 6"  (167.6 cm) Weight: 124.3 kg (274 lb 1.6 oz) IBW/kg (Calculated) : 59.3  Temp (24hrs), Avg:97.9 F (36.6 C), Min:97.7 F (36.5 C), Max:98.2 F (36.8 C)  Recent Labs  Lab 05/17/20 0849 05/18/20 0649 05/19/20 0653 05/20/20 0514 05/21/20 0613  WBC 32.4* 32.0* 27.6* 22.9* 19.0*  CREATININE 2.10* 2.01* 1.97* 1.87* 1.89*    Estimated Creatinine Clearance: 43.7 mL/min (A) (by C-G formula based on SCr of 1.89 mg/dL (H)).    Allergies  Allergen Reactions  . Acetaminophen Nausea And Vomiting  . Ibuprofen Other (See Comments)    Reports causes bleeding  . Gabapentin Rash  . Morphine Itching and Rash  . Morphine And Related Itching  . Zanaflex  [Tizanidine] Rash    9/17 JP drain: Pseudomonas aeruginosa, enterococcus 9/2 bcx: NG 8/24 Bcx > NG Final 8/17 wound cx > pseudomonas (pan-sensitive), ESBL E. Coli (resistant to all but imipenem, gentamicin, and Zosyn), Klebsiella (resistant to ampicillin/intermediate to Unasyn) 8/16 Bcx > NG final  8/17 Ucx > NG Final 8/20 Fungitell >> elevated 202 8/16 HIV RNA >> <20   HIV+ on Dolutegravir and rilpivirine  8/16 Genvoya >> 8/17 (& PTA) 8/16 Valtrex >> 8/18 (& PTA) 8/17 meropenem >> 9/6, 9/10 >> 8/20 Levaquin x 1  8/17 anidulafungin >> 8/24, 9/3 >>9/20 8/16 CTX >> 8/17 8/16 Flagyl >> 8/17 8/16 Zosyn >> 8/17, 9/20 >>9/22  9/22 Ceftazidime 9/22 >>   Thank you for allowing pharmacy to be a part of this patient's  care.  Benna Dunks, PharmD Candidate

## 2020-05-21 NOTE — Progress Notes (Signed)
Physical Therapy Treatment Patient Details Name: Melanie Bowen MRN: [id number] DOB: Aug 03, 1961 Today's Date: 05/21/2020    History of Present Illness  59 yo F who comes to [facility] on 8/16 c LLQ ABD pain. Pt admitted c Acute sigmoid diverticulitis with diverticular abscess. PMH: COPD, asthma, HTN, diverticulosis, HIV, GERD, depression, bilat TKA, 3 level lumbar fusion, susequent lumbar hardware removal. Pt moved to ICU early in stay, inititated on HD on 04/16/20, temp cath removed 8/25. Pt  has had AF RVR issues. Pt also reports a subacute Rt rotator cuff injury.  Had procedure on 05/06/20 for increasing drain tube size, to increase removal of abd drainage    PT Comments    Pt was very motivated to work hard in therapy so that she could get well to go home. Pt was able to complete all bed exercises with mild fatigue.  Pt was very impulsive and attempted bed mobility without help but needed Max +2 to come sitting EOB. Pt was very dizzy sitting EOB and needed to lie back down. Pt's sacral wound began hurting the pt a lot and pt had a bowel movement. PT helped nursing facilitate rolling for changing, which was Max+2. The pt was in pain and became agitated. Following linen changing the pt asked for more therapy and wanted to know when the next session was. Pt will benefit from PT services in a SNF setting upon discharge to safely address deficits listed in patient problem list for decreased caregiver assistance and eventual return to PLOF.  Follow Up Recommendations  SNF     Equipment Recommendations  None recommended by PT    Recommendations for Other Services       Precautions / Restrictions Precautions Precautions: Fall Precaution Comments: percutaneous drainage tube abd Restrictions Weight Bearing Restrictions: No Other Position/Activity Restrictions: abd drain    Mobility  Bed Mobility Overal bed mobility: Needs Assistance Bed Mobility: Rolling;Sidelying to Sit;Supine to Sit Rolling:  Max assist;+2 for physical assistance Sidelying to sit: Max assist;+2 for physical assistance Supine to sit: Max assist;+2 for physical assistance Sit to supine: Max assist;+2 for physical assistance   General bed mobility comments: +2 able to come to sitting but unable to maintain because of dizziness  Transfers                 General transfer comment: unable to attempt  Ambulation/Gait             General Gait Details: Unable to attempt   Stairs             Wheelchair Mobility    Modified Rankin (Stroke Patients Only)       Balance Overall balance assessment: Needs assistance Sitting-balance support: Feet unsupported;Bilateral upper extremity supported Sitting balance-Leahy Scale: Zero Sitting balance - Comments: pt unable to keep balance secondary to dizziness Postural control: Posterior lean     Standing balance comment: unable to attempt                            Cognition Arousal/Alertness: Awake/alert Behavior During Therapy: Impulsive Overall Cognitive Status: Within Functional Limits for tasks assessed                                 General Comments: pt became very agitated likely due to pain during linen change and cleaning      Exercises Total Joint Exercises Ankle  Circles/Pumps: AROM;10 reps;Both Quad Sets: AROM;Both;10 reps Hip ABduction/ADduction: AROM;Both;10 reps Straight Leg Raises: AROM;Both;10 reps Other Exercises Other Exercises: pt sat EOB for 1 minutes for tolerence to sitting but needed to lie back down due to dizziness    General Comments General comments (skin integrity, edema, etc.): sacral wound limited mobility secondary to pain      Pertinent Vitals/Pain Faces Pain Scale: Hurts worst Pain Location: pt reports no pain at rest but has signifigant pain in the sacrum following bowel movement and during bandage changing Pain Descriptors / Indicators:  Moaning;Burning;Crying;Squeezing Pain Intervention(s): Limited activity within patient's tolerance;Monitored during session;Repositioned;Other (comment) (cleaned up after bowel movement)    Home Living                      Prior Function            PT Goals (current goals can now be found in the care plan section) Acute Rehab PT Goals Patient Stated Goal: to get stronger and go home PT Goal Formulation: With patient Time For Goal Achievement: 05/31/20 Potential to Achieve Goals: Fair Progress towards PT goals: Progressing toward goals    Frequency    Min 2X/week      PT Plan Current plan remains appropriate    Co-evaluation              AM-PAC PT "6 Clicks" Mobility   Outcome Measure  Help needed turning from your back to your side while in a flat bed without using bedrails?: A Lot Help needed moving from lying on your back to sitting on the side of a flat bed without using bedrails?: Total Help needed moving to and from a bed to a chair (including a wheelchair)?: Total Help needed standing up from a chair using your arms (e.g., wheelchair or bedside chair)?: Total Help needed to walk in hospital room?: Total Help needed climbing 3-5 steps with a railing? : Total 6 Click Score: 7    End of Session Equipment Utilized During Treatment: Oxygen;Gait belt Activity Tolerance: Patient limited by pain Patient left: in bed;with nursing/sitter in room Nurse Communication: Mobility status PT Visit Diagnosis: Unsteadiness on feet (R26.81);Muscle weakness (generalized) (M62.81);Difficulty in walking, not elsewhere classified (R26.2)     Time: 0964-3838 PT Time Calculation (min) (ACUTE ONLY): 41 min  Charges:                        Hervey Ard, SPT 05/21/20. 5:04 PM

## 2020-05-21 NOTE — TOC Progression Note (Signed)
Transition of Care [facility]) - Progression Note    Patient Details  Name: Melanie Bowen MRN: [id number] Date of Birth: 02/15/1961  Transition of Care [facility]) CM/SW Contact  Candie Chroman, LCSW Phone Number: 05/21/2020, 1:29 PM  Clinical Narrative: Per TOC handoff, [facility] unable to offer a bed to patient anymore. CSW spoke to admissions coordinator who said their director of nursing declined due to the high acuity of their other patients and they did not feel they would be able to meet patient's needs at this time. CSW notified admissions coordinator that patient will likely not require HD at discharge if that would help.     Expected Discharge Plan and Services                                                 Social Determinants of Health (SDOH) Interventions    Readmission Risk Interventions Readmission Risk Prevention Plan 11/08/2019  Transportation Screening Complete  Medication Review Press photographer) Complete  PCP or Specialist appointment within 3-5 days of discharge Not Complete  PCP/Specialist Appt Not Complete comments Her PCP is out for 2-3 weeks so other two providers are booked out. The receptionist will call her if there is a cancellation.  [place] or Home Care Consult Complete  SW Recovery Care/Counseling Consult Complete  Palliative Care Screening Not Applicable  Skilled Nursing Facility Not Applicable  Some recent data might be hidden

## 2020-05-21 NOTE — Progress Notes (Addendum)
[facility]  ROUNDING NOTE   Subjective:   Patient appears in no acute distress. Kidney function recovering, dialysis on hold.  Objective:  Vital signs in last 24 hours:  Temp:  [97.7 F (36.5 C)-98.2 F (36.8 C)] 97.8 F (36.6 C) (09/22 1054) Pulse Rate:  [87-98] 93 (09/22 1054) Resp:  [18-20] 20 (09/22 1054) BP: (104-110)/(52-66) 107/65 (09/22 1054) SpO2:  [97 %-100 %] 99 % (09/22 1054)  Weight change:  Filed Weights   05/03/20 0547 05/09/20 0951 05/14/20 1349  Weight: 130.9 kg 119 kg 124.3 kg    Intake/Output: I/O last 3 completed shifts: In: 337.9 [P.O.:220; IV Piggyback:117.9] Out: 90 [Drains:90]   Intake/Output this shift:  No intake/output data recorded.  Physical Exam: General: Alert, oriented  Head: Normocephalic, atraumatic. Moist oral mucosal membranes  Eyes: Sclerae and conjunctivae clear  Neck: Supple  Lungs:  Normal and symmetrical respiratory effort,Lungs clear bilaterally  Heart: S1S2 + regular  Abdomen:  Soft, Non distended, non tender  Extremities:  No peripheral edema.  Neurologic: Alert, oriented x3   Skin: No acute lesions or rashes  Access: RIJ permcath     Basic Metabolic Panel: Recent Labs  Lab 05/16/20 0440 05/16/20 0440 05/16/20 0443 05/17/20 0849 05/17/20 0849 05/18/20 0649 05/18/20 0649 05/19/20 0653 05/20/20 0514 05/21/20 0613  NA 140   < >  --  138  --  139  --  138 139 140  K 3.0*   < >  --  3.4*  --  3.5  --  3.3* 3.6 3.3*  CL 103   < >  --  101  --  103  --  101 102 101  CO2 26   < >  --  26  --  28  --  28 29 28   GLUCOSE 82   < >  --  69*  --  79  --  84 76 62*  BUN 30*   < >  --  20  --  23*  --  25* 25* 25*  CREATININE 2.46*   < >  --  2.10*  --  2.01*  --  1.97* 1.87* 1.89*  CALCIUM 7.4*   < >  --  7.4*   < > 7.3*   < > 7.2* 7.2* 7.0*  MG 1.9  --   --   --   --   --   --   --   --   --   PHOS  --   --  3.4  --   --   --   --   --   --   --    < > = values in this interval not displayed.    Liver  Function Tests: Recent Labs  Lab 05/16/20 0440  AST 25  ALT 24  ALKPHOS 81  BILITOT 1.0  PROT 5.3*  ALBUMIN 1.1*   No results for input(s): LIPASE, AMYLASE in the last 168 hours. No results for input(s): AMMONIA in the last 168 hours.  CBC: Recent Labs  Lab 05/17/20 0849 05/18/20 0649 05/19/20 0653 05/20/20 0514 05/21/20 0613  WBC 32.4* 32.0* 27.6* 22.9* 19.0*  NEUTROABS 22.3* 22.8* 19.9* 16.4* 14.1*  HGB 7.3* 7.9* 7.6* 7.3* 7.8*  HCT 23.2* 24.4* 23.0* 22.8* 23.0*  MCV 92.1 91.4 89.8 94.6 91.3  PLT 228 251 241 264 295    Cardiac Enzymes: No results for input(s): CKTOTAL, CKMB, CKMBINDEX, TROPONINI in the last 168 hours.  BNP: Invalid input(s): POCBNP  CBG: No results for input(s): GLUCAP in the last 168 hours.  Microbiology: Results for orders placed or performed during the hospital encounter of 04/14/20  SARS Coronavirus 2 by RT PCR (hospital order, performed in [facility] hospital lab) Nasopharyngeal Nasopharyngeal Swab     Status: None   Collection Time: 04/14/20  2:17 AM   Specimen: Nasopharyngeal Swab  Result Value Ref Range Status   SARS Coronavirus 2 NEGATIVE NEGATIVE Final    Comment: (NOTE) SARS-CoV-2 target nucleic acids are NOT DETECTED.  The SARS-CoV-2 RNA is generally detectable in upper and lower respiratory specimens during the acute phase of infection. The lowest concentration of SARS-CoV-2 viral copies this assay can detect is 250 copies / mL. A negative result does not preclude SARS-CoV-2 infection and should not be used as the sole basis for treatment or other patient management decisions.  A negative result may occur with improper specimen collection / handling, submission of specimen other than nasopharyngeal swab, presence of viral mutation(s) within the areas targeted by this assay, and inadequate number of viral copies (<250 copies / mL). A negative result must be combined with clinical observations, patient history, and  epidemiological information.  Fact Sheet for Patients:   StrictlyIdeas.no  Fact Sheet for Healthcare Providers: BankingDealers.co.za  This test is not yet approved or  cleared by the [place] FDA and has been authorized for detection and/or diagnosis of SARS-CoV-2 by FDA under an Emergency Use Authorization (EUA).  This EUA will remain in effect (meaning this test can be used) for the duration of the COVID-19 declaration under Section 564(b)(1) of the Act, 21 U.S.C. section 360bbb-3(b)(1), unless the authorization is terminated or revoked sooner.  Performed at [facility], [address] [place], [place] [address]   CULTURE, BLOOD (ROUTINE X 2) w Reflex to ID Panel     Status: None   Collection Time: 04/14/20  8:14 AM   Specimen: BLOOD  Result Value Ref Range Status   Specimen Description BLOOD LEFT [facility]  Final   Special Requests   Final    BOTTLES DRAWN AEROBIC AND ANAEROBIC Blood Culture adequate volume   Culture   Final    NO GROWTH 5 DAYS Performed at [facility], [address] [place], [place] [address]    Report Status 04/19/2020 FINAL  Final  CULTURE, BLOOD (ROUTINE X 2) w Reflex to ID Panel     Status: None   Collection Time: 04/14/20  8:14 AM   Specimen: BLOOD  Result Value Ref Range Status   Specimen Description BLOOD LEFT WRIST  Final   Special Requests   Final    BOTTLES DRAWN AEROBIC AND ANAEROBIC Blood Culture adequate volume   Culture   Final    NO GROWTH 5 DAYS Performed at [facility], [address] [place], [place] [address]    Report Status 04/19/2020 FINAL  Final  Aerobic/Anaerobic Culture (surgical/deep wound)     Status: None   Collection Time: 04/14/20 11:14 AM   Specimen: Abscess  Result Value Ref Range Status   Specimen Description   Final    ABSCESS Performed at [facility], [address] [place], [place] [address]    Special Requests   Final     NONE Performed at [facility], [address] [place], [place] [address]    Gram Stain   Final    NO WBC SEEN ABUNDANT GRAM NEGATIVE RODS FEW GRAM POSITIVE COCCI FEW GRAM POSITIVE RODS Performed at  [facility], [place] [address], [place], [place] [address]    Culture   Final    ABUNDANT KLEBSIELLA PNEUMONIAE ABUNDANT PSEUDOMONAS AERUGINOSA MODERATE ESCHERICHIA COLI Confirmed Extended Spectrum Beta-Lactamase Producer (ESBL).  In bloodstream infections from ESBL organisms, carbapenems are preferred over piperacillin/tazobactam. They are shown to have a lower risk of mortality. MIXED ANAEROBIC FLORA PRESENT.  CALL LAB IF FURTHER IID REQUIRED.    Report Status 04/20/2020 FINAL  Final   Organism ID, Bacteria KLEBSIELLA PNEUMONIAE  Final   Organism ID, Bacteria PSEUDOMONAS AERUGINOSA  Final   Organism ID, Bacteria ESCHERICHIA COLI  Final      Susceptibility   Escherichia coli - MIC*    AMPICILLIN >=32 RESISTANT Resistant     CEFAZOLIN >=64 RESISTANT Resistant     CEFEPIME 16 RESISTANT Resistant     CEFTAZIDIME RESISTANT Resistant     CEFTRIAXONE >=64 RESISTANT Resistant     CIPROFLOXACIN >=4 RESISTANT Resistant     GENTAMICIN <=1 SENSITIVE Sensitive     IMIPENEM <=0.25 SENSITIVE Sensitive     TRIMETH/SULFA >=320 RESISTANT Resistant     AMPICILLIN/SULBACTAM >=32 RESISTANT Resistant     PIP/TAZO 8 SENSITIVE Sensitive     * MODERATE ESCHERICHIA COLI   Klebsiella pneumoniae - MIC*    AMPICILLIN >=32 RESISTANT Resistant     CEFAZOLIN <=4 SENSITIVE Sensitive     CEFEPIME <=0.12 SENSITIVE Sensitive     CEFTAZIDIME <=1 SENSITIVE Sensitive     CEFTRIAXONE <=0.25 SENSITIVE Sensitive     CIPROFLOXACIN <=0.25 SENSITIVE Sensitive     GENTAMICIN <=1 SENSITIVE Sensitive     IMIPENEM <=0.25 SENSITIVE Sensitive     TRIMETH/SULFA <=20 SENSITIVE Sensitive     AMPICILLIN/SULBACTAM 16 INTERMEDIATE Intermediate     PIP/TAZO 8 SENSITIVE Sensitive     * ABUNDANT KLEBSIELLA  PNEUMONIAE   Pseudomonas aeruginosa - MIC*    CEFTAZIDIME 4 SENSITIVE Sensitive     CIPROFLOXACIN <=0.25 SENSITIVE Sensitive     GENTAMICIN 2 SENSITIVE Sensitive     IMIPENEM 2 SENSITIVE Sensitive     PIP/TAZO 8 SENSITIVE Sensitive     CEFEPIME 4 SENSITIVE Sensitive     * ABUNDANT PSEUDOMONAS AERUGINOSA  MRSA PCR Screening     Status: None   Collection Time: 04/14/20  6:19 PM   Specimen: Nasopharyngeal  Result Value Ref Range Status   MRSA by PCR NEGATIVE NEGATIVE Final    Comment:        The GeneXpert MRSA Assay (FDA approved for NASAL specimens only), is one component of a comprehensive MRSA colonization surveillance program. It is not intended to diagnose MRSA infection nor to guide or monitor treatment for MRSA infections. Performed at [facility], [address], [place], [place] [address]   Urine Culture     Status: None   Collection Time: 04/15/20  4:28 AM   Specimen: Urine, Random  Result Value Ref Range Status   Specimen Description   Final    URINE, RANDOM Performed at [facility], [address], [place], [place] [address]    Special Requests   Final    NONE Performed at [facility], [address], [place], [place] [address]    Culture   Final    NO GROWTH Performed at [facility], [place] [address], [place],  [address]    Report Status 04/16/2020 FINAL  Final  CULTURE, BLOOD (ROUTINE X 2) w Reflex to ID Panel     Status: None  Collection Time: 04/22/20  6:12 PM   Specimen: BLOOD  Result Value Ref Range Status   Specimen Description BLOOD FOOT  Final   Special Requests   Final    BOTTLES DRAWN AEROBIC AND ANAEROBIC Blood Culture adequate volume   Culture   Final    NO GROWTH 5 DAYS Performed at [facility], [address] [place], [place] [address]    Report Status 04/27/2020 FINAL  Final  CULTURE, BLOOD (ROUTINE X 2) w Reflex to ID Panel     Status: None   Collection Time: 04/22/20  6:17 PM    Specimen: BLOOD  Result Value Ref Range Status   Specimen Description BLOOD FOOT  Final   Special Requests   Final    BOTTLES DRAWN AEROBIC AND ANAEROBIC Blood Culture results may not be optimal due to an excessive volume of blood received in culture bottles   Culture   Final    NO GROWTH 5 DAYS Performed at [facility], [address] [place], [place] [address]    Report Status 04/27/2020 FINAL  Final  CULTURE, BLOOD (ROUTINE X 2) w Reflex to ID Panel     Status: None   Collection Time: 05/01/20  2:50 PM   Specimen: BLOOD  Result Value Ref Range Status   Specimen Description BLOOD BLOOD LEFT HAND  Final   Special Requests   Final    BOTTLES DRAWN AEROBIC AND ANAEROBIC Blood Culture adequate volume   Culture   Final    NO GROWTH 5 DAYS Performed at [facility], [address] [place], [place] [address]    Report Status 05/06/2020 FINAL  Final  CULTURE, BLOOD (ROUTINE X 2) w Reflex to ID Panel     Status: None   Collection Time: 05/01/20  2:51 PM   Specimen: BLOOD  Result Value Ref Range Status   Specimen Description BLOOD BLOOD RIGHT HAND  Final   Special Requests   Final    BOTTLES DRAWN AEROBIC AND ANAEROBIC Blood Culture adequate volume   Culture   Final    NO GROWTH 5 DAYS Performed at [facility], [address] [place], [place] [address]    Report Status 05/06/2020 FINAL  Final  Aerobic/Anaerobic Culture (surgical/deep wound)     Status: None   Collection Time: 05/15/20  4:36 PM   Specimen: Abdomen; Abdominal Fluid  Result Value Ref Range Status   Specimen Description   Final    ABDOMEN Performed at [facility], [address] [place], [place] [address]    Special Requests   Final    ABDOMINAL FLUID Performed at [facility], [address] [place], [place] [address]    Gram Stain   Final    RARE WBC PRESENT, PREDOMINANTLY MONONUCLEAR NO ORGANISMS SEEN    Culture   Final    No growth aerobically or  anaerobically. Performed at [facility], [place] [address], [place], [place] [address]    Report Status 05/20/2020 FINAL  Final  Body fluid culture     Status: None   Collection Time: 05/16/20  6:37 PM   Specimen: JP Drain; Body Fluid  Result Value Ref Range Status   Specimen Description   Final    JP DRAINAGE Performed at [facility], [address], [place], [place] [address]    Special Requests   Final    NONE Performed at [facility], [address] [place],  [address]  Gram Stain   Final    FEW WBC PRESENT,BOTH PMN AND MONONUCLEAR ABUNDANT GRAM POSITIVE COCCI MODERATE GRAM NEGATIVE RODS Performed at [facility], [place] [address] [place], [place] [address]    Culture   Final    ABUNDANT PSEUDOMONAS AERUGINOSA ABUNDANT ENTEROCOCCUS SPECIES    Report Status 05/20/2020 FINAL  Final   Organism ID, Bacteria PSEUDOMONAS AERUGINOSA  Final   Organism ID, Bacteria ENTEROCOCCUS SPECIES  Final      Susceptibility   Enterococcus species - MIC*    AMPICILLIN 16 RESISTANT Resistant     VANCOMYCIN 2 SENSITIVE Sensitive     GENTAMICIN SYNERGY SENSITIVE Sensitive     * ABUNDANT ENTEROCOCCUS SPECIES   Pseudomonas aeruginosa - MIC*    CEFTAZIDIME 4 SENSITIVE Sensitive     CIPROFLOXACIN 1 SENSITIVE Sensitive     GENTAMICIN 8 INTERMEDIATE Intermediate     IMIPENEM >=16 RESISTANT Resistant     PIP/TAZO 8 SENSITIVE Sensitive     * ABUNDANT PSEUDOMONAS AERUGINOSA  Fungus Culture With Stain     Status: Abnormal (Preliminary result)   Collection Time: 05/16/20  6:37 PM   Specimen: JP Drain  Result Value Ref Range Status   Fungus Stain Final report (A)  Final    Comment: (NOTE) Performed At: [facility] [address] [place], [place] [id number] Rush Farmer MD YC:[id number]    Fungus (Mycology) Culture PENDING  Incomplete   Fungal Source JP DRAINAGE  Final    Comment: Performed at [facility], [address]  [place], [place] [address]  Fungus Culture Result     Status: Abnormal   Collection Time: 05/16/20  6:37 PM  Result Value Ref Range Status   Result 1 Yeast observed (A)  Final    Comment: (NOTE) Performed At: [facility] [place], [place] [id number] Rush Farmer MD YD:[id number]     Coagulation Studies: No results for input(s): LABPROT, INR in the last 72 hours.  Urinalysis: No results for input(s): COLORURINE, LABSPEC, PHURINE, GLUCOSEU, HGBUR, BILIRUBINUR, KETONESUR, PROTEINUR, UROBILINOGEN, NITRITE, LEUKOCYTESUR in the last 72 hours.  Invalid input(s): APPERANCEUR    Imaging: No results found.   Medications:   . sodium chloride 250 mL (05/03/20 0043)  . sodium chloride Stopped (05/12/20 0620)  . piperacillin-tazobactam (ZOSYN)  IV 3.375 g (05/21/20 0555)   . sodium chloride   Intravenous Once  . alteplase  2 mg Intracatheter Once  . alteplase  2 mg Intracatheter Once  . amiodarone  200 mg Oral Daily  . Chlorhexidine Gluconate Cloth  6 each Topical Daily  . dextromethorphan-guaiFENesin  10 mL Oral Q8H  . rilpivirine  25 mg Oral Q breakfast   And  . dolutegravir  50 mg Oral Q breakfast  . dronabinol  5 mg Oral BID AC  . epoetin (EPOGEN/PROCRIT) injection  10,000 Units Intravenous Q M,W,F-HD  . famotidine  10 mg Oral Q1200  . feeding supplement (ENSURE ENLIVE)  237 mL Oral TID BM  . heparin injection (subcutaneous)  5,000 Units Subcutaneous Q8H  . mirtazapine  15 mg Oral QHS  . mometasone-formoterol  2 puff Inhalation BID  . multivitamin  1 tablet Oral QHS  . multivitamin with minerals  1 tablet Oral Daily  . sodium chloride flush  5 mL Intracatheter Q8H  . torsemide  40 mg Oral Daily   sodium chloride, acetaminophen **OR** acetaminophen, albuterol, ALPRAZolam, bisacodyl, diphenhydrAMINE, docusate sodium, fentaNYL, heparin NICU/SCN flush, HYDROmorphone (DILAUDID) injection, iohexol, ipratropium-albuterol, magic mouthwash **AND**  lidocaine,  magnesium hydroxide, menthol-cetylpyridinium, metoprolol tartrate, oxyCODONE-acetaminophen, phenol, simethicone  Assessment/ Plan:  Melanie Bowen is a 59 y.o.  female with HIV, COPD, HTN , GERD, who is admitted for Dyspnea and  colonic diverticular abcess.    1. AKI requiring hemodialysis. Last hemodialysis treatment was 9/17.Baseline creatinine of 1 with normal GFR on 11/08/2019.  -Urine clear yellow, output adequate -Creatinine 1.89 today -Kidney function recovering -No need for dialysis today  2. Anemia of CKD: Required blood transfusions during the admission - Hgb 7.8 today   3. Hypertension - Blood pressure readings within low normal range -Continue current regimen    LOS: 37 Princy Raju 9/22/202111:45 AM   Patient was seen and examined with Crosby Oyster, DNP. Patient seems to have recovered renal function with good nonoliguric urine output and GFR of 29 today. If does not need dialysis for a full two weeks (10/1), recommend removal of tunneled dialysis catheter.  Continues to have hypokalemia secondary to torsemide and renal failure. Start oral potassium chloride 33mEq daily.  Above plan was discussed and agreed upon on the signing of this note.   Lavonia Dana, [facility]  9/22/202112:50 PM

## 2020-05-22 LAB — CBC WITH DIFFERENTIAL/PLATELET
Abs Immature Granulocytes: 0.24 10*3/uL — ABNORMAL HIGH (ref 0.00–0.07)
Basophils Absolute: 0.1 10*3/uL (ref 0.0–0.1)
Basophils Relative: 0 %
Eosinophils Absolute: 0.1 10*3/uL (ref 0.0–0.5)
Eosinophils Relative: 1 %
HCT: 22.1 % — ABNORMAL LOW (ref 36.0–46.0)
Hemoglobin: 7 g/dL — ABNORMAL LOW (ref 12.0–15.0)
Immature Granulocytes: 2 %
Lymphocytes Relative: 12 %
Lymphs Abs: 1.8 10*3/uL (ref 0.7–4.0)
MCH: 29.3 pg (ref 26.0–34.0)
MCHC: 31.7 g/dL (ref 30.0–36.0)
MCV: 92.5 fL (ref 80.0–100.0)
Monocytes Absolute: 2 10*3/uL — ABNORMAL HIGH (ref 0.1–1.0)
Monocytes Relative: 14 %
Neutro Abs: 10.7 10*3/uL — ABNORMAL HIGH (ref 1.7–7.7)
Neutrophils Relative %: 71 %
Platelets: 286 10*3/uL (ref 150–400)
RBC: 2.39 MIL/uL — ABNORMAL LOW (ref 3.87–5.11)
RDW: 20.1 % — ABNORMAL HIGH (ref 11.5–15.5)
WBC: 15 10*3/uL — ABNORMAL HIGH (ref 4.0–10.5)
nRBC: 0.1 % (ref 0.0–0.2)

## 2020-05-22 LAB — BASIC METABOLIC PANEL
Anion gap: 10 (ref 5–15)
BUN: 24 mg/dL — ABNORMAL HIGH (ref 6–20)
CO2: 27 mmol/L (ref 22–32)
Calcium: 6.8 mg/dL — ABNORMAL LOW (ref 8.9–10.3)
Chloride: 102 mmol/L (ref 98–111)
Creatinine, Ser: 1.84 mg/dL — ABNORMAL HIGH (ref 0.44–1.00)
GFR calc Af Amer: 34 mL/min — ABNORMAL LOW (ref >60)
GFR calc non Af Amer: 30 mL/min — ABNORMAL LOW (ref >60)
Glucose, Bld: 74 mg/dL (ref 70–99)
Potassium: 3.5 mmol/L (ref 3.5–5.1)
Sodium: 139 mmol/L (ref 135–145)

## 2020-05-22 LAB — MAGNESIUM: Magnesium: 1.6 mg/dL — ABNORMAL LOW (ref 1.7–2.4)

## 2020-05-22 LAB — PREPARE RBC (CROSSMATCH)

## 2020-05-22 MED ORDER — HYDROCERIN EX CREA
TOPICAL_CREAM | Freq: Two times a day (BID) | CUTANEOUS | Status: DC | PRN
  Filled 2020-05-22: qty 113

## 2020-05-22 MED ORDER — SODIUM CHLORIDE 0.9% IV SOLUTION: Freq: Once | INTRAVENOUS | Status: AC

## 2020-05-22 MED ORDER — EPOETIN ALFA 10000 UNIT/ML IJ SOLN
10000.0000 [IU] | Freq: Once | INTRAMUSCULAR | Status: AC
  Administered 2020-05-22: 10000 [IU] via SUBCUTANEOUS
  Filled 2020-05-22: qty 1

## 2020-05-22 MED ORDER — BARRIER CREAM NON-SPECIFIED: 1.0000 "application " | TOPICAL_CREAM | Freq: Two times a day (BID) | TOPICAL | Status: DC | PRN

## 2020-05-22 NOTE — Progress Notes (Signed)
[facility] SURGICAL PROGRESS NOTE (cpt [phone])  Hospital Day(s): 43.   Interval History:  Patient seen and examined no acute events or new complaints overnight. Patient reports she is doing well, no abdominal pain, No fever, chills, nausea, emesis. Leukocytosis continues downward trend, now 15.0K Renal function improvement has staled some, sCr - 1.84, UO - 900 ccs + unmeasured Mild hypomagnesemia to 1.6 but otherwise no electrolyte derangement Drain output60 ccs,seropurulent Cx from 09/17 w/ PCN resistant enterococcus; ID switched ABx to ceftazidime, flagyl, po linezolid; plan for 10 days Tolerating regular diet, no issues Having bowel function Working with therapies; recommending SNF  Review of Systems:  Constitutional: denies fever, chills  HEENT: denies cough or congestion  Respiratory: denies any shortness of breath  Cardiovascular: denies chest pain or palpitations  Gastrointestinal: denies abdominal pain, N/V, or diarrhea/and bowel function as per interval history Genitourinary: denies burning with urination or urinary frequency Musculoskeletal: denies pain, decreased motor or sensation   Vital signs in last 24 hours: [min-max] current  Temp:  [97.8 F (36.6 C)] 97.8 F (36.6 C) (09/22 2156) Pulse Rate:  [86-93] 86 (09/22 2156) Resp:  [18-20] 18 (09/22 2156) BP: (107-111)/(58-65) 111/58 (09/22 2156) SpO2:  [99 %] 99 % (09/22 2156)     Height: 5\' 6"  (167.6 cm) Weight: 124.3 kg BMI (Calculated): 44.26   Intake/Output last 2 shifts:  09/22 0701 - 09/23 0700 In: 74 [P.O.:440; IV Piggyback:232] Out: 960 [Urine:900; Drains:60]   Physical Exam:  Constitutional: alert, cooperative and no distress HENT: normocephalic without obvious abnormality  Eyes: PERRL, EOM's grossly intact and symmetric  Respiratory: breathing non-labored at rest, on [place] Cardiovascular: regular rate and sinus rhythm  Gastrointestinal: soft,tenderness over suprapubic drain site,  I am unable to illicit any additional tenderness, and non-distended, no rebound/guarding,certainly without evidence of peritonitis,JP in LLQ outputis seropurulent Musculoskeletal: no edema or wounds, motor and sensation grossly intact, NT   Labs:  CBC Latest Ref Rng & Units 05/22/2020 05/21/2020 05/20/2020  WBC 4.0 - 10.5 K/uL 15.0(H) 19.0(H) 22.9(H)  Hemoglobin 12.0 - 15.0 g/dL 7.0(L) 7.8(L) 7.3(L)  Hematocrit 36 - 46 % 22.1(L) 23.0(L) 22.8(L)  Platelets 150 - 400 K/uL 286 295 264   CMP Latest Ref Rng & Units 05/22/2020 05/21/2020 05/20/2020  Glucose 70 - 99 mg/dL 74 62(L) 76  BUN 6 - 20 mg/dL 24(H) 25(H) 25(H)  Creatinine 0.44 - 1.00 mg/dL 1.84(H) 1.89(H) 1.87(H)  Sodium 135 - 145 mmol/L 139 140 139  Potassium 3.5 - 5.1 mmol/L 3.5 3.3(L) 3.6  Chloride 98 - 111 mmol/L 102 101 102  CO2 22 - 32 mmol/L 27 28 29   Calcium 8.9 - 10.3 mg/dL 6.8(L) 7.0(L) 7.2(L)  Total Protein 6.5 - 8.1 g/dL - - -  Total Bilirubin 0.3 - 1.2 mg/dL - - -  Alkaline Phos 38 - 126 U/L - - -  AST 15 - 41 U/L - - -  ALT 0 - 44 U/L - - -     Imaging studies: No new pertinent imaging studies   Assessment/Plan: (ICD-10's: K57.20) 59 y.o.femalewith slow improvement, improvement in leukocytosis, admitted withsigmoid diverticulitis with abscess s/p percutaneous drainage on 08/16andshe continues to bewithout any evidence of peritonitis/pneumoperitoneum/pneumatosis on examination or imaginghowever her drain output has now become feculentconcerning for colonic fistula however we appear to have better control of outputwith up-sizing drain on 09/16,complicated by pertinent comorbidities includingmarked COPD   - I believe we are slowly making good progress and 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 She will likely require this drain for some time as an outpatient. If her leukocytosis continues to improve, we may be moving closer to disposition in the next few days/week, no  rush, ensure we have adequate control              - Okay to continue diet   - Continue IV + PO ABx (ceftazidime, flagyl, po linezolid --> Day 2/10); ID following; greatly appreciate assistance  - Monitor abdominal examination; leukocytosis - Pain control prn; antiemetics prn - Appreciate nephrology assistance and recommendations - Continue to work with therapies; suspect she will need significant rehab - Further management per primary service; we will continue to follow   All of the above findings and recommendations were discussed with the patient, and the medical team, and all of patient's questions were answered to her expressed satisfaction.  -- Edison Simon, PA-C [facility] 05/22/2020, 7:20 AM [phone] M-F: 7am - 4pm

## 2020-05-22 NOTE — TOC Progression Note (Addendum)
Transition of Care [facility]) - Progression Note    Patient Details  Name: Melanie Bowen MRN: [id number] Date of Birth: [birth year]  Transition of Care [facility]) CM/SW [place], LCSW Phone Number: 05/22/2020, 11:54 AM  Clinical Narrative: Updated FL2 and sent to [facility] and [facility].  will review and confirm they can still offer a bed in case [facility] still cannot accept her once she's ready.  2:51 pm: [facility] is unable to offer patient a bed at this time. Sent updated clinicals to [facility] facilities that had already declined or not responded and well as some of the facilities in surrounding counties.  Expected Discharge Plan and Services                                                 Social Determinants of Health (SDOH) Interventions    Readmission Risk Interventions Readmission Risk Prevention Plan 11/08/2019  Transportation Screening Complete  Medication Review Press photographer) Complete  PCP or Specialist appointment within 3-5 days of discharge Not Complete  PCP/Specialist Appt Not Complete comments Her PCP is out for 2-3 weeks so other two providers are booked out. The receptionist will call her if there is a cancellation.  [place] or Home Care Consult Complete  SW Recovery Care/Counseling Consult Complete  Palliative Care Screening Not Applicable  Skilled Nursing Facility Not Applicable  Some recent data might be hidden

## 2020-05-22 NOTE — Progress Notes (Addendum)
[facility]  ROUNDING NOTE   Subjective:   Patient appears alert, oriented, in no acute distress.renal function progressively improving.  Objective:  Vital signs in last 24 hours:  Temp:  [97.8 F (36.6 C)] 97.8 F (36.6 C) (09/22 2156) Pulse Rate:  [86] 86 (09/22 2156) Resp:  [18] 18 (09/22 2156) BP: (111)/(58) 111/58 (09/22 2156) SpO2:  [99 %] 99 % (09/22 2156)  Weight change:  Filed Weights   05/03/20 0547 05/09/20 0951 05/14/20 1349  Weight: 130.9 kg 119 kg 124.3 kg    Intake/Output: I/O last 3 completed shifts: In: 892 [P.O.:660; IV Piggyback:232] Out: 1000 [Urine:900; Drains:100]   Intake/Output this shift:  No intake/output data recorded.  Physical Exam: General: Alert, oriented  Head: Normocephalic, atraumatic. Moist oral mucosal membranes  Eyes: Anicteric  Neck: Supple  Lungs:  Diminished at the bases bilaterally  Heart: S1S2 + regular  Abdomen:  Soft, Non distended, non tender  Extremities:  No peripheral edema.  Neurologic: Alert, oriented   Skin: No acute lesions or rashes  Access: RIJ permcath     Basic Metabolic Panel: Recent Labs  Lab 05/16/20 0440 05/16/20 0443 05/17/20 0849 05/18/20 0649 05/18/20 0649 05/19/20 0653 05/19/20 0653 05/20/20 0514 05/21/20 0613 05/22/20 0415  NA 140  --    < > 139  --  138  --  139 140 139  K 3.0*  --    < > 3.5  --  3.3*  --  3.6 3.3* 3.5  CL 103  --    < > 103  --  101  --  102 101 102  CO2 26  --    < > 28  --  28  --  29 28 27   GLUCOSE 82  --    < > 79  --  84  --  76 62* 74  BUN 30*  --    < > 23*  --  25*  --  25* 25* 24*  CREATININE 2.46*  --    < > 2.01*  --  1.97*  --  1.87* 1.89* 1.84*  CALCIUM 7.4*  --    < > 7.3*   < > 7.2*   < > 7.2* 7.0* 6.8*  MG 1.9  --   --   --   --   --   --   --   --  1.6*  PHOS  --  3.4  --   --   --   --   --   --   --   --    < > = values in this interval not displayed.    Liver Function Tests: Recent Labs  Lab 05/16/20 0440  AST 25  ALT 24   ALKPHOS 81  BILITOT 1.0  PROT 5.3*  ALBUMIN 1.1*   No results for input(s): LIPASE, AMYLASE in the last 168 hours. No results for input(s): AMMONIA in the last 168 hours.  CBC: Recent Labs  Lab 05/18/20 0649 05/19/20 0653 05/20/20 0514 05/21/20 0613 05/22/20 0415  WBC 32.0* 27.6* 22.9* 19.0* 15.0*  NEUTROABS 22.8* 19.9* 16.4* 14.1* 10.7*  HGB 7.9* 7.6* 7.3* 7.8* 7.0*  HCT 24.4* 23.0* 22.8* 23.0* 22.1*  MCV 91.4 89.8 94.6 91.3 92.5  PLT 251 241 264 295 286    Cardiac Enzymes: No results for input(s): CKTOTAL, CKMB, CKMBINDEX, TROPONINI in the last 168 hours.  BNP: Invalid input(s): POCBNP  CBG: No results for input(s): GLUCAP in the last 168 hours.  Microbiology: Results for orders placed or performed during the hospital encounter of 04/14/20  SARS Coronavirus 2 by RT PCR (hospital order, performed in [facility] hospital lab) Nasopharyngeal Nasopharyngeal Swab     Status: None   Collection Time: 04/14/20  2:17 AM   Specimen: Nasopharyngeal Swab  Result Value Ref Range Status   SARS Coronavirus 2 NEGATIVE NEGATIVE Final    Comment: (NOTE) SARS-CoV-2 target nucleic acids are NOT DETECTED.  The SARS-CoV-2 RNA is generally detectable in upper and lower respiratory specimens during the acute phase of infection. The lowest concentration of SARS-CoV-2 viral copies this assay can detect is 250 copies / mL. A negative result does not preclude SARS-CoV-2 infection and should not be used as the sole basis for treatment or other patient management decisions.  A negative result may occur with improper specimen collection / handling, submission of specimen other than nasopharyngeal swab, presence of viral mutation(s) within the areas targeted by this assay, and inadequate number of viral copies (<250 copies / mL). A negative result must be combined with clinical observations, patient history, and epidemiological information.  Fact Sheet for Patients:    StrictlyIdeas.no  Fact Sheet for Healthcare Providers: BankingDealers.co.za  This test is not yet approved or  cleared by the [place] FDA and has been authorized for detection and/or diagnosis of SARS-CoV-2 by FDA under an Emergency Use Authorization (EUA).  This EUA will remain in effect (meaning this test can be used) for the duration of the COVID-19 declaration under Section 564(b)(1) of the Act, 21 U.S.C. section 360bbb-3(b)(1), unless the authorization is terminated or revoked sooner.  Performed at [facility], [address] [place], [place] [address]   CULTURE, BLOOD (ROUTINE X 2) w Reflex to ID Panel     Status: None   Collection Time: 04/14/20  8:14 AM   Specimen: BLOOD  Result Value Ref Range Status   Specimen Description BLOOD LEFT [facility]  Final   Special Requests   Final    BOTTLES DRAWN AEROBIC AND ANAEROBIC Blood Culture adequate volume   Culture   Final    NO GROWTH 5 DAYS Performed at [facility], [address] [place], [place] [address]    Report Status 04/19/2020 FINAL  Final  CULTURE, BLOOD (ROUTINE X 2) w Reflex to ID Panel     Status: None   Collection Time: 04/14/20  8:14 AM   Specimen: BLOOD  Result Value Ref Range Status   Specimen Description BLOOD LEFT WRIST  Final   Special Requests   Final    BOTTLES DRAWN AEROBIC AND ANAEROBIC Blood Culture adequate volume   Culture   Final    NO GROWTH 5 DAYS Performed at [facility], [address], [place], [place] [address]    Report Status 04/19/2020 FINAL  Final  Aerobic/Anaerobic Culture (surgical/deep wound)     Status: None   Collection Time: 04/14/20 11:14 AM   Specimen: Abscess  Result Value Ref Range Status   Specimen Description   Final    ABSCESS Performed at [facility], [address], [place], [place] [address]    Special Requests   Final    NONE Performed at [facility], [place], [place] [address]    Gram Stain   Final    NO WBC SEEN ABUNDANT GRAM NEGATIVE RODS FEW GRAM POSITIVE COCCI FEW GRAM POSITIVE RODS Performed at [facility], [place] [address] [place], [place] [address]  Culture   Final    ABUNDANT KLEBSIELLA PNEUMONIAE ABUNDANT PSEUDOMONAS AERUGINOSA MODERATE ESCHERICHIA COLI Confirmed Extended Spectrum Beta-Lactamase Producer (ESBL).  In bloodstream infections from ESBL organisms, carbapenems are preferred over piperacillin/tazobactam. They are shown to have a lower risk of mortality. MIXED ANAEROBIC FLORA PRESENT.  CALL LAB IF FURTHER IID REQUIRED.    Report Status 04/20/2020 FINAL  Final   Organism ID, Bacteria KLEBSIELLA PNEUMONIAE  Final   Organism ID, Bacteria PSEUDOMONAS AERUGINOSA  Final   Organism ID, Bacteria ESCHERICHIA COLI  Final      Susceptibility   Escherichia coli - MIC*    AMPICILLIN >=32 RESISTANT Resistant     CEFAZOLIN >=64 RESISTANT Resistant     CEFEPIME 16 RESISTANT Resistant     CEFTAZIDIME RESISTANT Resistant     CEFTRIAXONE >=64 RESISTANT Resistant     CIPROFLOXACIN >=4 RESISTANT Resistant     GENTAMICIN <=1 SENSITIVE Sensitive     IMIPENEM <=0.25 SENSITIVE Sensitive     TRIMETH/SULFA >=320 RESISTANT Resistant     AMPICILLIN/SULBACTAM >=32 RESISTANT Resistant     PIP/TAZO 8 SENSITIVE Sensitive     * MODERATE ESCHERICHIA COLI   Klebsiella pneumoniae - MIC*    AMPICILLIN >=32 RESISTANT Resistant     CEFAZOLIN <=4 SENSITIVE Sensitive     CEFEPIME <=0.12 SENSITIVE Sensitive     CEFTAZIDIME <=1 SENSITIVE Sensitive     CEFTRIAXONE <=0.25 SENSITIVE Sensitive     CIPROFLOXACIN <=0.25 SENSITIVE Sensitive     GENTAMICIN <=1 SENSITIVE Sensitive     IMIPENEM <=0.25 SENSITIVE Sensitive     TRIMETH/SULFA <=20 SENSITIVE Sensitive     AMPICILLIN/SULBACTAM 16 INTERMEDIATE Intermediate     PIP/TAZO 8 SENSITIVE Sensitive     * ABUNDANT KLEBSIELLA PNEUMONIAE   Pseudomonas aeruginosa - MIC*     CEFTAZIDIME 4 SENSITIVE Sensitive     CIPROFLOXACIN <=0.25 SENSITIVE Sensitive     GENTAMICIN 2 SENSITIVE Sensitive     IMIPENEM 2 SENSITIVE Sensitive     PIP/TAZO 8 SENSITIVE Sensitive     CEFEPIME 4 SENSITIVE Sensitive     * ABUNDANT PSEUDOMONAS AERUGINOSA  MRSA PCR Screening     Status: None   Collection Time: 04/14/20  6:19 PM   Specimen: Nasopharyngeal  Result Value Ref Range Status   MRSA by PCR NEGATIVE NEGATIVE Final    Comment:        The GeneXpert MRSA Assay (FDA approved for NASAL specimens only), is one component of a comprehensive MRSA colonization surveillance program. It is not intended to diagnose MRSA infection nor to guide or monitor treatment for MRSA infections. Performed at [facility], [address] [place], [place] [address]   Urine Culture     Status: None   Collection Time: 04/15/20  4:28 AM   Specimen: Urine, Random  Result Value Ref Range Status   Specimen Description   Final    URINE, RANDOM Performed at [facility], [address], [place], [place] [address]    Special Requests   Final    NONE Performed at [facility], [address], [place], [place] [address]    Culture   Final    NO GROWTH Performed at [facility], [place] [address], [place], [place] [address]    Report Status 04/16/2020 FINAL  Final  CULTURE, BLOOD (ROUTINE X 2) w Reflex to ID Panel     Status: None   Collection Time: 04/22/20  6:12 PM   Specimen: BLOOD  Result Value Ref  Range Status   Specimen Description BLOOD FOOT  Final   Special Requests   Final    BOTTLES DRAWN AEROBIC AND ANAEROBIC Blood Culture adequate volume   Culture   Final    NO GROWTH 5 DAYS Performed at [facility], [address] [place], [place] [address]    Report Status 04/27/2020 FINAL  Final  CULTURE, BLOOD (ROUTINE X 2) w Reflex to ID Panel     Status: None   Collection Time: 04/22/20  6:17 PM   Specimen: BLOOD  Result Value Ref Range  Status   Specimen Description BLOOD FOOT  Final   Special Requests   Final    BOTTLES DRAWN AEROBIC AND ANAEROBIC Blood Culture results may not be optimal due to an excessive volume of blood received in culture bottles   Culture   Final    NO GROWTH 5 DAYS Performed at [facility], [address] [place], [place] [address]    Report Status 04/27/2020 FINAL  Final  CULTURE, BLOOD (ROUTINE X 2) w Reflex to ID Panel     Status: None   Collection Time: 05/01/20  2:50 PM   Specimen: BLOOD  Result Value Ref Range Status   Specimen Description BLOOD BLOOD LEFT HAND  Final   Special Requests   Final    BOTTLES DRAWN AEROBIC AND ANAEROBIC Blood Culture adequate volume   Culture   Final    NO GROWTH 5 DAYS Performed at [facility], [address] [place], [place] [address]    Report Status 05/06/2020 FINAL  Final  CULTURE, BLOOD (ROUTINE X 2) w Reflex to ID Panel     Status: None   Collection Time: 05/01/20  2:51 PM   Specimen: BLOOD  Result Value Ref Range Status   Specimen Description BLOOD BLOOD RIGHT HAND  Final   Special Requests   Final    BOTTLES DRAWN AEROBIC AND ANAEROBIC Blood Culture adequate volume   Culture   Final    NO GROWTH 5 DAYS Performed at [facility], [address] [place], [place] [address]    Report Status 05/06/2020 FINAL  Final  Aerobic/Anaerobic Culture (surgical/deep wound)     Status: None   Collection Time: 05/15/20  4:36 PM   Specimen: Abdomen; Abdominal Fluid  Result Value Ref Range Status   Specimen Description   Final    ABDOMEN Performed at [facility], [address] [place], [place] [address]    Special Requests   Final    ABDOMINAL FLUID Performed at [facility], [address] [place], [place] [address]    Gram Stain   Final    RARE WBC PRESENT, PREDOMINANTLY MONONUCLEAR NO ORGANISMS SEEN    Culture   Final    No growth aerobically or anaerobically. Performed at [facility], [place] [address], [place], [place] [address]    Report Status 05/20/2020 FINAL  Final  Body fluid culture     Status: None   Collection Time: 05/16/20  6:37 PM   Specimen: JP Drain; Body Fluid  Result Value Ref Range Status   Specimen Description   Final    JP DRAINAGE Performed at [facility], [address], [place],  [address]    Special Requests   Final    NONE Performed at [facility], [address] [place],  [address]    Gram Stain   Final    FEW WBC PRESENT,BOTH PMN  AND MONONUCLEAR ABUNDANT GRAM POSITIVE COCCI MODERATE GRAM NEGATIVE RODS Performed at [facility], [place] [address], [place], [place] [address]    Culture   Final    ABUNDANT PSEUDOMONAS AERUGINOSA ABUNDANT ENTEROCOCCUS SPECIES    Report Status 05/20/2020 FINAL  Final   Organism ID, Bacteria PSEUDOMONAS AERUGINOSA  Final   Organism ID, Bacteria ENTEROCOCCUS SPECIES  Final      Susceptibility   Enterococcus species - MIC*    AMPICILLIN 16 RESISTANT Resistant     VANCOMYCIN 2 SENSITIVE Sensitive     GENTAMICIN SYNERGY SENSITIVE Sensitive     * ABUNDANT ENTEROCOCCUS SPECIES   Pseudomonas aeruginosa - MIC*    CEFTAZIDIME 4 SENSITIVE Sensitive     CIPROFLOXACIN 1 SENSITIVE Sensitive     GENTAMICIN 8 INTERMEDIATE Intermediate     IMIPENEM >=16 RESISTANT Resistant     PIP/TAZO 8 SENSITIVE Sensitive     * ABUNDANT PSEUDOMONAS AERUGINOSA  Fungus Culture With Stain     Status: Abnormal (Preliminary result)   Collection Time: 05/16/20  6:37 PM   Specimen: JP Drain  Result Value Ref Range Status   Fungus Stain Final report (A)  Final    Comment: (NOTE) Performed At: [facility] [address] [place], [place] [id number] Rush Farmer MD [id number]    Fungus (Mycology) Culture PENDING  Incomplete   Fungal Source JP DRAINAGE  Final    Comment: Performed at [facility], [address] [place], [place] [address]  Fungus Culture Result      Status: Abnormal   Collection Time: 05/16/20  6:37 PM  Result Value Ref Range Status   Result 1 Yeast observed (A)  Final    Comment: (NOTE) Performed At: [facility] [place], [place] [id number] Rush Farmer MD ZM:[id number]     Coagulation Studies: No results for input(s): LABPROT, INR in the last 72 hours.  Urinalysis: No results for input(s): COLORURINE, LABSPEC, PHURINE, GLUCOSEU, HGBUR, BILIRUBINUR, KETONESUR, PROTEINUR, UROBILINOGEN, NITRITE, LEUKOCYTESUR in the last 72 hours.  Invalid input(s): APPERANCEUR    Imaging: No results found.   Medications:    sodium chloride 250 mL (05/03/20 0043)   sodium chloride Stopped (05/12/20 0620)   cefTAZidime (FORTAZ)  IV 2 g (05/22/20 0917)    sodium chloride   Intravenous Once   sodium chloride   Intravenous Once   amiodarone  200 mg Oral Daily   Chlorhexidine Gluconate Cloth  6 each Topical Daily   dextromethorphan-guaiFENesin  10 mL Oral Q8H   rilpivirine  25 mg Oral Q breakfast   And   dolutegravir  50 mg Oral Q breakfast   dronabinol  5 mg Oral BID AC   epoetin (EPOGEN/PROCRIT) injection  10,000 Units Intravenous Q M,W,F-HD   famotidine  10 mg Oral Q1200   feeding supplement (ENSURE ENLIVE)  237 mL Oral TID BM   heparin injection (subcutaneous)  5,000 Units Subcutaneous Q8H   linezolid  600 mg Oral Q12H   metroNIDAZOLE  500 mg Oral Q8H   mirtazapine  15 mg Oral QHS   mometasone-formoterol  2 puff Inhalation BID   multivitamin  1 tablet Oral QHS   multivitamin with minerals  1 tablet Oral Daily   sodium chloride flush  5 mL Intracatheter Q8H   torsemide  40 mg Oral Daily   sodium chloride, acetaminophen **OR** acetaminophen, albuterol, ALPRAZolam, bisacodyl, diphenhydrAMINE, docusate sodium, fentaNYL, heparin NICU/SCN flush, hydrocerin, HYDROmorphone (DILAUDID) injection, iohexol, ipratropium-albuterol, magic mouthwash **AND** lidocaine, magnesium hydroxide,  menthol-cetylpyridinium,  metoprolol tartrate, oxyCODONE-acetaminophen, phenol, simethicone  Assessment/ Plan:  Ms. ELEANA TOCCO is a 59 y.o.  female with HIV, COPD, HTN , GERD, who is admitted for Dyspnea and  colonic diverticular abcess.    1. AKI requiring hemodialysis. Last hemodialysis treatment was 9/17.Baseline creatinine of 1 with normal GFR on 11/08/2019.  -UOP clear yellow,adequate amount -Creatinine 1.84 today -Kidney function improving gradually -No need for dialysis at this point -May consider discontinuing  catheter -Will consult vascular   2. Anemia of CKD: Required blood transfusions during the admission - Hgb 7.0 today -Will continue monitoring CBCs closely   3. Hypertension - Blood pressure readings remains within low normal range     LOS: [place] 9/23/202112:53 PM   Patient was seen and examined with Crosby Oyster, DNP. Kidney function has recovered and no longer requiring dialysis. Consult vascular to have permcath removed. Above plan was discussed and agreed upon on the signing of this note.   Lavonia Dana, [facility]  9/23/20211:54 PM

## 2020-05-22 NOTE — Progress Notes (Addendum)
Daily Progress Note   Patient Name: Melanie Bowen       Date: 05/22/2020 DOB: [birth year]  Age: 59 y.o. MRN#: [id number] Attending Physician: Sidney Ace, MD Primary Care Physician: Theotis Burrow, MD Admit Date: 04/14/2020  Reason for Consultation/Follow-up: Establishing goals of care  Subjective: Patient is resting in bed, and has just been cleaned after a BM by staff. Upon staff leaving room, she focuses on t.v., [employer]. She states her daughter has been able to come to the hospital and spend time with her, and she is grateful for that. Broached discussing her status and her feelings, and she quickly states that she was working a job prior to this hospitalization, and wants to do what is needed to get past this current illness, and resume her life. She resumed full focus on t.v. and speaking along with the host on t.v., "yes Lord", "praise God". Thanked her for her time and she began praying with the host.   Recommend palliative at D/C. Please call if needed for change in patient status.      Length of Stay: 38  Current Medications: Scheduled Meds:  . sodium chloride   Intravenous Once  . amiodarone  200 mg Oral Daily  . Chlorhexidine Gluconate Cloth  6 each Topical Daily  . dextromethorphan-guaiFENesin  10 mL Oral Q8H  . rilpivirine  25 mg Oral Q breakfast   And  . dolutegravir  50 mg Oral Q breakfast  . dronabinol  5 mg Oral BID AC  . epoetin (EPOGEN/PROCRIT) injection  10,000 Units Intravenous Q M,W,F-HD  . famotidine  10 mg Oral Q1200  . feeding supplement (ENSURE ENLIVE)  237 mL Oral TID BM  . heparin injection (subcutaneous)  5,000 Units Subcutaneous Q8H  . linezolid  600 mg Oral Q12H  . metroNIDAZOLE  500 mg Oral Q8H  . mirtazapine  15 mg Oral QHS  .  mometasone-formoterol  2 puff Inhalation BID  . multivitamin  1 tablet Oral QHS  . multivitamin with minerals  1 tablet Oral Daily  . sodium chloride flush  5 mL Intracatheter Q8H  . torsemide  40 mg Oral Daily    Continuous Infusions: . sodium chloride 250 mL (05/03/20 0043)  . sodium chloride Stopped (05/12/20 0620)  . cefTAZidime (FORTAZ)  IV 2 g (  05/22/20 0917)    PRN Meds: sodium chloride, acetaminophen **OR** acetaminophen, albuterol, ALPRAZolam, bisacodyl, diphenhydrAMINE, docusate sodium, fentaNYL, heparin NICU/SCN flush, hydrocerin, HYDROmorphone (DILAUDID) injection, iohexol, ipratropium-albuterol, magic mouthwash **AND** lidocaine, magnesium hydroxide, menthol-cetylpyridinium, metoprolol tartrate, oxyCODONE-acetaminophen, phenol, simethicone  Physical Exam Neurological:     Mental Status: She is alert.             Vital Signs: BP (!) 111/58 (BP Location: Left Arm)   Pulse 86   Temp 97.8 F (36.6 C) (Oral)   Resp 18   Ht 5\' 6"  (1.676 m)   Wt 124.3 kg   SpO2 99%   BMI 44.24 kg/m  SpO2: SpO2: 99 % O2 Device: O2 Device: Nasal Cannula O2 Flow Rate: O2 Flow Rate (L/min): 2 L/min  Intake/output summary:   Intake/Output Summary (Last 24 hours) at 05/22/2020 1243 Last data filed at 05/22/2020 0600 Gross per 24 hour  Intake 672.02 ml  Output 960 ml  Net -287.98 ml   LBM: Last BM Date: 05/21/20 Baseline Weight: Weight: 117.9 kg Most recent weight: Weight: 124.3 kg       Palliative Assessment/Data:    Flowsheet Rows     Most Recent Value  Intake Tab  Referral Department Hospitalist  Unit at Time of Referral ICU  Palliative Care Primary Diagnosis Other (Comment)  [sigmoid colon ]  Date Notified 04/14/20  Palliative Care Type New Palliative care  Reason for referral Clarify Goals of Care  Date of Admission 04/14/20  Date first seen by Palliative Care 04/15/20  # of days Palliative referral response time 1 Day(s)  # of days IP prior to Palliative referral 0   Clinical Assessment  Psychosocial & Spiritual Assessment  Palliative Care Outcomes      Patient Active Problem List   Diagnosis Date Noted  . Pressure injury of skin 05/08/2020  . Goals of care, counseling/discussion   . Dyspnea   . Acute renal failure (ARF) ([place]) 04/16/2020  . Severe sepsis with septic shock ([place]) 04/16/2020  . Hypotension 04/16/2020  . Diverticulosis 04/16/2020  . Acute respiratory distress   . DNR (do not resuscitate) discussion   . Palliative care by specialist   . Abscess of sigmoid colon due to diverticulitis 04/14/2020  . Multifocal pneumonia 11/06/2019  . AKI (acute kidney injury) ([place]) 11/06/2019  . Leukocytosis 11/06/2019  . Diverticulitis of large intestine with abscess without bleeding 01/17/2019  . Morbid (severe) obesity due to excess calories ([place]) 11/26/2018  . Violation of controlled substance agreement 12/05/2017  . Positive urine drug screen (+cocaine) 12/05/2017  . Cervicalgia 12/05/2017  . Drug-seeking behavior 12/05/2017  . Community acquired pneumonia 11/28/2016  . Pneumonia 11/28/2016  . Allergic rhinitis 05/27/2016  . Tobacco abuse counseling 02/11/2016  . Symptomatic anemia   . Lower GI bleed   . Anemia 01/16/2016  . GI bleed 01/10/2016  . GERD (gastroesophageal reflux disease) 01/10/2016  . HTN (hypertension) 01/10/2016  . Depression 01/10/2016  . HIV (human immunodeficiency virus infection) ([place]) 01/10/2016  . Lumbar pseudoarthrosis 10/31/2015  . Chronic cough   . Cough 06/19/2015  . DDD (degenerative disc disease), lumbar 12/31/2014  . Degenerative disc disease, lumbar 12/31/2014  . Asthma, chronic 11/12/2014    Palliative Care Assessment & Plan    Recommendations/Plan: Continue current care.   Recommend palliative at D/C.  Please call if needed for change in patient's status.    Code Status:    Code Status Orders  (From admission, onward)  Start     Ordered   04/14/20 1859  Limited resuscitation  (code)  Continuous       Question Answer Comment  In the event of cardiac or respiratory ARREST: Initiate Code Blue, Call Rapid Response Yes   In the event of cardiac or respiratory ARREST: Perform CPR Yes   In the event of cardiac or respiratory ARREST: Perform Intubation/Mechanical Ventilation No   In the event of cardiac or respiratory ARREST: Use NIPPV/BiPAp only if indicated No   In the event of cardiac or respiratory ARREST: Administer ACLS medications if indicated Yes   In the event of cardiac or respiratory ARREST: Perform Defibrillation or Cardioversion if indicated Yes      04/14/20 1858        Code Status History    Date Active Date Inactive Code Status Order ID Comments User Context   04/14/2020 0235 04/14/2020 1858 Full Code [id number]  Mansy, Arvella Merles, MD ED   11/06/2019 0501 11/08/2019 1533 Full Code [id number]  Athena Masse, MD ED   01/17/2019 1756 01/18/2019 2120 Full Code [id number]  Benjamine Sprague, DO Inpatient   11/28/2016 1726 12/01/2016 1332 Full Code [id number]  Vaughan Basta, MD Inpatient   01/16/2016 1643 01/18/2016 1358 Full Code [id number]  Hillary Bow, MD ED   01/10/2016 0251 01/14/2016 1254 Full Code [id number]  Lance Coon, MD Inpatient   12/31/2014 1345 01/02/2015 1259 Full Code [id number]  Earnie Larsson, MD Inpatient   Advance Care Planning Activity      Prognosis: Poor   Thank you for allowing the Palliative Medicine Team to assist in the care of this patient.   Total Time 15 min Prolonged Time Billed  no      Greater than 50%  of this time was spent counseling and coordinating care related to the above assessment and plan.  Asencion Gowda, NP  Please contact Palliative Medicine Team phone at [phone] for questions and concerns.

## 2020-05-22 NOTE — Progress Notes (Signed)
PROGRESS NOTE    Melanie Bowen  JGG:[id number] DOB: 1961-02-17 DOA: 04/14/2020 PCP: Theotis Burrow, MD   Brief Narrative:   Melanie Bowen is a 59 y.o. female with medical history significant for hypertension, diverticulosis, enteric infection, morbid obesity, GERD,COPD, HIV infection,history of stroke,asthma,depression, seizures enzymes with acute onset of abdominal pain nausea vomitingandconstipation.  She was admitted to the hospital for septic shock secondary to acute diverticulitis with diverticular abscess. She was treated with IV fluids and empiric IV antimicrobial therapy. She required vasopressors for septic shock. She was seen by interventional radiologist and CT-guided drainage was done by IR on 04/14/2020. Fluid culture showed Pseudomonas aeruginosa, Klebsiella pneumonia and E. coli. Septic shock was complicated by acute kidney injury, acute liver failure, acute metabolic encephalopathy and acute hypoxemic respiratory failure. She was seen by the nephrologist for AKI. Patient was started on hemodialysis for AKI. She developed atrial fibrillation with RVR. She was seen in consultation by the cardiologist as well.  She remains on amiodarone for atrial fibrillation.  Because of persistent leukocytosis and recurrent fever, patient underwent upsizing and repositioning of indwelling lower abdominal drain from 12 [place] to 14 [place] on 05/15/2020.  There was concern for suspected perforation of diverticulum on imaging.  She has been transfused with about 4 units of packed red blood cells thus far for severe anemia.  Surgery following.  Appears to be slowly improving.   Assessment & Plan:   Principal Problem:   Severe sepsis with septic shock (HCC) Active Problems:   HTN (hypertension)   HIV (human immunodeficiency virus infection) ([place])   Diverticulitis of large intestine with abscess without bleeding   Abscess of sigmoid colon due to diverticulitis    Acute renal failure (ARF) ([place])   Hypotension   Diverticulosis   Acute respiratory distress   DNR (do not resuscitate) discussion   Palliative care by specialist   Dyspnea   Goals of care, counseling/discussion   Pressure injury of skin  Acute sigmoid diverticulitis and diverticular abscess s/p CT-guided drainage by IR on 04/14/2020. Fluid cultures with Klebsiella pneumonia, Pseudomonas, E. Coli Appears to be slowly improving Responding to drain upsizing Plan: Drain management per general surgery Currently on Zyvox and Zosyn, will continue Discussed with ID.  They are comfortable transitioning to oral antibiotics in preparation for discharge when general surgery is on board with transitioning likely towards SNF  Septic shock Resolved Antibiotics as above  Acute anemia Multifactorial in the setting of poor p.o. intake and acute illness Status post transfusion of packed red blood cells on 05/01/2020, 05/04/2020, 05/09/2020, 05/15/2020 05/22/2020: Hemoglobin 7.0.  Will transfuse an additional unit packed red blood cells  Acute kidney injury requiring hemodialysis Hyperkalemia, resolved Metabolic acidosis, resolved Renal function is improved substantially Nephrology following but no current plans for i ongoing hemodialysis Monitor urine output Monitor potassium frequently  Atrial fibrillation with rapid ventricular response, improved Currently rate and rhythm controlled on amiodarone Defer anticoagulation at this time given acute illness  COPD exacerbation, resolved Acute hypoxemic respiratory failure, resolved Vital signs per unit protocol Oxygen as needed  HIV infection Continue current antiretrovirals  Acute liver failure, resolved In the setting of acute illness  Acute metabolic/toxic encephalopathy Mental status at baseline currently   DVT prophylaxis: Subcutaneous heparin Code Status: Partial Family Communication: Daughter Anona Giovannini [phone] on  05/22/2020 Disposition Plan: Status is: Inpatient  Remains inpatient appropriate because:Inpatient level of care appropriate due to severity of illness   Dispo: The patient is from: Home  Anticipated d/c is to: SNF              Anticipated d/c date is: > 3 days              Patient currently is not medically stable to d/c.    Consultants:   General surgery  Infectious disease  Nephrology  Procedures:   See above  Antimicrobials:   Zyvox  Zosyn   Subjective: Seen and examined.  Complains of irritation to buttock region  Objective: Vitals:   05/20/20 2009 05/21/20 0536 05/21/20 1054 05/21/20 2156  BP: (!) 105/52 (!) 110/57 107/65 (!) 111/58  Pulse: 91 87 93 86  Resp: 20 20 20 18   Temp: 98.2 F (36.8 C) 97.7 F (36.5 C) 97.8 F (36.6 C) 97.8 F (36.6 C)  TempSrc: Oral Oral Oral Oral  SpO2: 100% 97% 99% 99%  Weight:      Height:        Intake/Output Summary (Last 24 hours) at 05/22/2020 1249 Last data filed at 05/22/2020 0600 Gross per 24 hour  Intake 672.02 ml  Output 960 ml  Net -287.98 ml   Filed Weights   05/03/20 0547 05/09/20 0951 05/14/20 1349  Weight: 130.9 kg 119 kg 124.3 kg    Examination:  General: No apparent distress.  Appears stated age.  Appears chronically ill HEENT: Normocephalic, atraumatic Neck, supple, trachea midline, no tenderness Heart: Regular rate and rhythm, S1/S2 normal, no murmurs Lungs: Poor respiratory effort.  Decreased at bases.  Scattered crackles.  Normal work of breathing.  2 l  abdomen: Obese, nontender, nondistended, right side abdominal drain Extremities: Normal, atraumatic, no clubbing or cyanosis, normal muscle tone Skin: No rashes or lesions, normal color Neurologic: Cranial nerves grossly intact, sensation intact, alert and oriented x3 Psychiatric: Normal affect      Data Reviewed: I have personally reviewed following labs and imaging studies  CBC: Recent Labs  Lab 05/18/20 0649  05/19/20 0653 05/20/20 0514 05/21/20 0613 05/22/20 0415  WBC 32.0* 27.6* 22.9* 19.0* 15.0*  NEUTROABS 22.8* 19.9* 16.4* 14.1* 10.7*  HGB 7.9* 7.6* 7.3* 7.8* 7.0*  HCT 24.4* 23.0* 22.8* 23.0* 22.1*  MCV 91.4 89.8 94.6 91.3 92.5  PLT 251 241 264 295 009   Basic Metabolic Panel: Recent Labs  Lab 05/16/20 0440 05/16/20 0443 05/17/20 0849 05/18/20 0649 05/19/20 0653 05/20/20 0514 05/21/20 0613 05/22/20 0415  NA 140  --    < > 139 138 139 140 139  K 3.0*  --    < > 3.5 3.3* 3.6 3.3* 3.5  CL 103  --    < > 103 101 102 101 102  CO2 26  --    < > 28 28 29 28 27   GLUCOSE 82  --    < > 79 84 76 62* 74  BUN 30*  --    < > 23* 25* 25* 25* 24*  CREATININE 2.46*  --    < > 2.01* 1.97* 1.87* 1.89* 1.84*  CALCIUM 7.4*  --    < > 7.3* 7.2* 7.2* 7.0* 6.8*  MG 1.9  --   --   --   --   --   --  1.6*  PHOS  --  3.4  --   --   --   --   --   --    < > = values in this interval not displayed.   GFR: Estimated Creatinine Clearance: 44.9 mL/min (A) (by C-G formula based on SCr  of 1.84 mg/dL (H)). Liver Function Tests: Recent Labs  Lab 05/16/20 0440  AST 25  ALT 24  ALKPHOS 81  BILITOT 1.0  PROT 5.3*  ALBUMIN 1.1*   No results for input(s): LIPASE, AMYLASE in the last 168 hours. No results for input(s): AMMONIA in the last 168 hours. Coagulation Profile: No results for input(s): INR, PROTIME in the last 168 hours. Cardiac Enzymes: No results for input(s): CKTOTAL, CKMB, CKMBINDEX, TROPONINI in the last 168 hours. BNP (last 3 results) No results for input(s): PROBNP in the last 8760 hours. HbA1C: No results for input(s): HGBA1C in the last 72 hours. CBG: No results for input(s): GLUCAP in the last 168 hours. Lipid Profile: No results for input(s): CHOL, HDL, LDLCALC, TRIG, CHOLHDL, LDLDIRECT in the last 72 hours. Thyroid Function Tests: No results for input(s): TSH, T4TOTAL, FREET4, T3FREE, THYROIDAB in the last 72 hours. Anemia Panel: No results for input(s): VITAMINB12, FOLATE,  FERRITIN, TIBC, IRON, RETICCTPCT in the last 72 hours. Sepsis Labs: No results for input(s): PROCALCITON, LATICACIDVEN in the last 168 hours.  Recent Results (from the past 240 hour(s))  Aerobic/Anaerobic Culture (surgical/deep wound)     Status: None   Collection Time: 05/15/20  4:36 PM   Specimen: Abdomen; Abdominal Fluid  Result Value Ref Range Status   Specimen Description   Final    ABDOMEN Performed at [facility], [address] [place], [place] [address]    Special Requests   Final    ABDOMINAL FLUID Performed at [facility], [address] [place], [place] [address]    Gram Stain   Final    RARE WBC PRESENT, PREDOMINANTLY MONONUCLEAR NO ORGANISMS SEEN    Culture   Final    No growth aerobically or anaerobically. Performed at [facility], [place] [address], [place], [place] [address]    Report Status 05/20/2020 FINAL  Final  Body fluid culture     Status: None   Collection Time: 05/16/20  6:37 PM   Specimen: JP Drain; Body Fluid  Result Value Ref Range Status   Specimen Description   Final    JP DRAINAGE Performed at [facility], [address] [place], [place] [address]    Special Requests   Final    NONE Performed at [facility], [place], [place] [address]    Gram Stain   Final    FEW WBC PRESENT,BOTH PMN AND MONONUCLEAR ABUNDANT GRAM POSITIVE COCCI MODERATE GRAM NEGATIVE RODS Performed at [facility], [place] [address] [place],  [address]    Culture   Final    ABUNDANT PSEUDOMONAS AERUGINOSA ABUNDANT ENTEROCOCCUS SPECIES    Report Status 05/20/2020 FINAL  Final   Organism ID, Bacteria PSEUDOMONAS AERUGINOSA  Final   Organism ID, Bacteria ENTEROCOCCUS SPECIES  Final      Susceptibility   Enterococcus species - MIC*    AMPICILLIN 16 RESISTANT Resistant     VANCOMYCIN 2 SENSITIVE Sensitive     GENTAMICIN SYNERGY SENSITIVE Sensitive     * ABUNDANT ENTEROCOCCUS SPECIES    Pseudomonas aeruginosa - MIC*    CEFTAZIDIME 4 SENSITIVE Sensitive     CIPROFLOXACIN 1 SENSITIVE Sensitive     GENTAMICIN 8 INTERMEDIATE Intermediate     IMIPENEM >=16 RESISTANT Resistant     PIP/TAZO 8 SENSITIVE Sensitive     * ABUNDANT PSEUDOMONAS AERUGINOSA  Fungus Culture With Stain     Status: Abnormal (Preliminary result)   Collection Time:  05/16/20  6:37 PM   Specimen: JP Drain  Result Value Ref Range Status   Fungus Stain Final report (A)  Final    Comment: (NOTE) Performed At: [facility] [place], [place] [id number] Rush Farmer MD [id number]    Fungus (Mycology) Culture PENDING  Incomplete   Fungal Source JP DRAINAGE  Final    Comment: Performed at [facility], [address] [place], [place] [address]  Fungus Culture Result     Status: Abnormal   Collection Time: 05/16/20  6:37 PM  Result Value Ref Range Status   Result 1 Yeast observed (A)  Final    Comment: (NOTE) Performed At: [facility] [place], [place] [id number] Rush Farmer MD [id number]          Radiology Studies: No results found.      Scheduled Meds: . sodium chloride   Intravenous Once  . amiodarone  200 mg Oral Daily  . Chlorhexidine Gluconate Cloth  6 each Topical Daily  . dextromethorphan-guaiFENesin  10 mL Oral Q8H  . rilpivirine  25 mg Oral Q breakfast   And  . dolutegravir  50 mg Oral Q breakfast  . dronabinol  5 mg Oral BID AC  . epoetin (EPOGEN/PROCRIT) injection  10,000 Units Intravenous Q M,W,F-HD  . famotidine  10 mg Oral Q1200  . feeding supplement (ENSURE ENLIVE)  237 mL Oral TID BM  . heparin injection (subcutaneous)  5,000 Units Subcutaneous Q8H  . linezolid  600 mg Oral Q12H  . metroNIDAZOLE  500 mg Oral Q8H  . mirtazapine  15 mg Oral QHS  . mometasone-formoterol  2 puff Inhalation BID  . multivitamin  1 tablet Oral QHS  . multivitamin with minerals  1 tablet Oral Daily  . sodium chloride flush  5  mL Intracatheter Q8H  . torsemide  40 mg Oral Daily   Continuous Infusions: . sodium chloride 250 mL (05/03/20 0043)  . sodium chloride Stopped (05/12/20 0620)  . cefTAZidime (FORTAZ)  IV 2 g (05/22/20 0917)     LOS: 38 days    Time spent: 35 minutes    Sidney Ace, MD [facility] Pager [phone]  If 7PM-7AM, please contact night-coverage 05/22/2020, 12:49 PM

## 2020-05-22 NOTE — NC FL2 (Addendum)
[place] LEVEL OF CARE SCREENING TOOL     IDENTIFICATION  Patient Name: Melanie Bowen Birthdate: 1961-07-04 Sex: female Admission Date (Current Location): 04/14/2020  [place] and [place] Number:  Engineering geologist and Address:  [facility], [address], [place], [place] [address]      Provider Number: [id number]  Attending Physician Name and Address:  Sidney Ace, MD  Relative Name and Phone Number:  Tammy/sister [phone]    Current Level of Care: Hospital Recommended Level of Care: [place] Prior Approval Number:    Date Approved/Denied:   PASRR Number: [id number]  Discharge Plan: SNF    Current Diagnoses: Patient Active Problem List   Diagnosis Date Noted  . Pressure injury of skin 05/08/2020  . Goals of care, counseling/discussion   . Dyspnea   . Acute renal failure (ARF) ([place]) 04/16/2020  . Severe sepsis with septic shock ([place]) 04/16/2020  . Hypotension 04/16/2020  . Diverticulosis 04/16/2020  . Acute respiratory distress   . DNR (do not resuscitate) discussion   . Palliative care by specialist   . Abscess of sigmoid colon due to diverticulitis 04/14/2020  . Multifocal pneumonia 11/06/2019  . AKI (acute kidney injury) ([place]) 11/06/2019  . Leukocytosis 11/06/2019  . Diverticulitis of large intestine with abscess without bleeding 01/17/2019  . Morbid (severe) obesity due to excess calories ([place]) 11/26/2018  . Violation of controlled substance agreement 12/05/2017  . Positive urine drug screen (+cocaine) 12/05/2017  . Cervicalgia 12/05/2017  . Drug-seeking behavior 12/05/2017  . Community acquired pneumonia 11/28/2016  . Pneumonia 11/28/2016  . Allergic rhinitis 05/27/2016  . Tobacco abuse counseling 02/11/2016  . Symptomatic anemia   . Lower GI bleed   . Anemia 01/16/2016  . GI bleed 01/10/2016  . GERD (gastroesophageal reflux disease) 01/10/2016  . HTN (hypertension) 01/10/2016   . Depression 01/10/2016  . HIV (human immunodeficiency virus infection) ([place]) 01/10/2016  . Lumbar pseudoarthrosis 10/31/2015  . Chronic cough   . Cough 06/19/2015  . DDD (degenerative disc disease), lumbar 12/31/2014  . Degenerative disc disease, lumbar 12/31/2014  . Asthma, chronic 11/12/2014    Orientation RESPIRATION BLADDER Height & Weight     Self, Situation, Place  O2 (Nasal Canula 2 L) Incontinent, External catheter Weight: 274 lb 1.6 oz (124.3 kg) Height:  5\' 6"  (167.6 cm)  BEHAVIORAL SYMPTOMS/MOOD NEUROLOGICAL BOWEL NUTRITION STATUS   (None)  (None) Incontinent Diet (DYS 3)  AMBULATORY STATUS COMMUNICATION OF NEEDS Skin   Extensive Assist Verbally Skin abrasions, Other (Comment), Bruising, PU Stage and Appropriate Care (Blister, catheter entry/exit, cracking, excoriated location. MASD on breasts with cracking in folds (no dressing). MASD on labia (No dressing). Skin tear on right upper proximal posterior thigh (foam prn). Incision on left abdomen (ABD, gauze daily).)   PU Stage 2 Dressing:  (Right and left buttocks: Foam prn.)                   Personal Care Assistance Level of Assistance  Bathing, Feeding, Dressing Bathing Assistance: Maximum assistance Feeding assistance: Maximum assistance Dressing Assistance: Maximum assistance   Functional Limitations Info  Sight, Hearing, Speech Sight Info: Adequate Hearing Info: Adequate Speech Info: Adequate    SPECIAL CARE FACTORS FREQUENCY  PT (By licensed PT), OT (By licensed OT)     PT Frequency: 5 x week OT Frequency: 5 x week          Isolation: Contact precautions for ESBL.    Contractures Contractures  Info: Not present    Additional Factors Info  Code Status, Allergies Code Status Info: Partial Code: No intubation/mechanical ventilation, No NIPPV/Bipap Allergies Info: Acetaminophen, Ibuprofen, Gabapentin, Morphine, Morphine And Related, Zanaflex  (Tizanidine)           Current Medications  (05/22/2020):  This is the current hospital active medication list Current Facility-Administered Medications  Medication Dose Route Frequency Provider Last Rate Last Admin  . 0.9 %  sodium chloride infusion (Manually program via Guardrails IV Fluids)   Intravenous Once Jennye Boroughs, MD      . 0.9 %  sodium chloride infusion  250 mL Intravenous Continuous Jennye Boroughs, MD 10 mL/hr at 05/03/20 0043 250 mL at 05/03/20 0043  . 0.9 %  sodium chloride infusion   Intravenous PRN Wyvonnia Dusky, MD   Stopped at 05/12/20 [phone]  . acetaminophen (TYLENOL) tablet 650 mg  650 mg Oral Q6H PRN Jennye Boroughs, MD   650 mg at 05/19/20 2351   Or  . acetaminophen (TYLENOL) suppository 650 mg  650 mg Rectal Q6H PRN Jennye Boroughs, MD      . albuterol (PROVENTIL) (2.5 MG/3ML) 0.083% nebulizer solution 2.5 mg  2.5 mg Nebulization Q4H PRN Jennye Boroughs, MD      . ALPRAZolam Duanne Moron) tablet 1 mg  1 mg Oral TID PRN Jennye Boroughs, MD   1 mg at 05/02/20 1056  . amiodarone (PACERONE) tablet 200 mg  200 mg Oral Daily Jennye Boroughs, MD   200 mg at 05/22/20 0908  . bisacodyl (DULCOLAX) EC tablet 10 mg  10 mg Oral Daily PRN Jennye Boroughs, MD   10 mg at 05/03/20 1558  . cefTAZidime (FORTAZ) 2 g in sodium chloride 0.9 % 100 mL IVPB  2 g Intravenous Q12H Berton Mount, RPH 200 mL/hr at 05/22/20 0917 2 g at 05/22/20 0917  . Chlorhexidine Gluconate Cloth 2 % PADS 6 each  6 each Topical Daily Jennye Boroughs, MD   6 each at 05/22/20 0700  . dextromethorphan-guaiFENesin (ROBITUSSIN-DM) 10-100 MG/5ML liquid 10 mL  10 mL Oral Q8H Jennye Boroughs, MD      . diphenhydrAMINE (BENADRYL) capsule 25 mg  25 mg Oral Q8H PRN Jennye Boroughs, MD   25 mg at 05/17/20 2147  . docusate sodium (COLACE) capsule 200 mg  200 mg Oral BID PRN Wyvonnia Dusky, MD      . rilpivirine [facility]) tablet 25 mg  25 mg Oral Q breakfast Tsosie Billing, MD   25 mg at 05/22/20 5176   And  . dolutegravir (TIVICAY) tablet 50 mg  50 mg Oral Q  breakfast Tsosie Billing, MD   50 mg at 05/22/20 0908  . dronabinol (MARINOL) capsule 5 mg  5 mg Oral BID AC Sreenath, Sudheer B, MD   5 mg at 05/21/20 1750  . epoetin alfa (EPOGEN) injection 10,000 Units  10,000 Units Intravenous Q M,W,F-HD Jennye Boroughs, MD   10,000 Units at 05/16/20 1118  . famotidine (PEPCID) tablet 10 mg  10 mg Oral Q1200 Jennye Boroughs, MD   10 mg at 05/21/20 1750  . feeding supplement (ENSURE ENLIVE) (ENSURE ENLIVE) liquid 237 mL  237 mL Oral TID BM Wyvonnia Dusky, MD   237 mL at 05/21/20 1057  . fentaNYL (SUBLIMAZE) injection   Intravenous PRN Suzette Battiest, MD   25 mcg at 05/15/20 1614  . heparin injection 5,000 Units  5,000 Units Subcutaneous Q8H Jennye Boroughs, MD   5,000 Units at 05/22/20 1607  . heparin SCN  flush for CVL/PCVC (NS + heparin 1 unit/ml)  0.5-1.7 mL Intravenous PRN Jennye Boroughs, MD   1.4 mL at 04/16/20 1717  . hydrocerin (EUCERIN) cream   Topical BID PRN Ralene Muskrat B, MD      . HYDROmorphone (DILAUDID) injection 0.5-1 mg  0.5-1 mg Intravenous Q2H PRN Jennye Boroughs, MD   1 mg at 05/06/20 1027  . iohexol (OMNIPAQUE) 9 MG/ML oral solution 1,000 mL  1,000 mL Oral Once PRN Jennye Boroughs, MD      . ipratropium-albuterol (DUONEB) 0.5-2.5 (3) MG/3ML nebulizer solution 3 mL  3 mL Nebulization Q6H PRN Jennye Boroughs, MD   3 mL at 05/05/20 0911  . magic mouthwash  5 mL Oral QID PRN Jennye Boroughs, MD       And  . lidocaine (XYLOCAINE) 2 % viscous mouth solution 15 mL  15 mL Mouth/Throat QID PRN Jennye Boroughs, MD   15 mL at 05/03/20 1059  . linezolid (ZYVOX) tablet 600 mg  600 mg Oral Q12H Tsosie Billing, MD   600 mg at 05/22/20 0908  . magnesium hydroxide (MILK OF MAGNESIA) suspension 30 mL  30 mL Oral Daily PRN Jennye Boroughs, MD   30 mL at 05/03/20 1558  . menthol-cetylpyridinium (CEPACOL) lozenge 3 mg  1 lozenge Oral PRN Jennye Boroughs, MD   3 mg at 04/24/20 1059  . metoprolol tartrate (LOPRESSOR) injection 5 mg  5 mg  Intravenous Q6H PRN Wyvonnia Dusky, MD   5 mg at 05/12/20 1540  . metroNIDAZOLE (FLAGYL) tablet 500 mg  500 mg Oral Q8H Tsosie Billing, MD   500 mg at 05/22/20 2993  . mirtazapine (REMERON) tablet 15 mg  15 mg Oral QHS Ralene Muskrat B, MD   15 mg at 05/21/20 2153  . mometasone-formoterol (DULERA) 200-5 MCG/ACT inhaler 2 puff  2 puff Inhalation BID Jennye Boroughs, MD   2 puff at 05/22/20 0907  . multivitamin (RENA-VIT) tablet 1 tablet  1 tablet Oral QHS Jennye Boroughs, MD   1 tablet at 05/21/20 2153  . multivitamin with minerals tablet 1 tablet  1 tablet Oral Daily Wyvonnia Dusky, MD   1 tablet at 05/21/20 1751  . oxyCODONE-acetaminophen (PERCOCET) 7.5-325 MG per tablet 1 tablet  1 tablet Oral Q6H PRN Jennye Boroughs, MD   1 tablet at 05/20/20 1709  . phenol (CHLORASEPTIC) mouth spray 1 spray  1 spray Mouth/Throat PRN Jennye Boroughs, MD   1 spray at 05/17/20 2147  . simethicone (MYLICON) chewable tablet 80 mg  80 mg Oral QID PRN Jennye Boroughs, MD   80 mg at 04/29/20 0918  . sodium chloride flush (NS) 0.9 % injection 5 mL  5 mL Intracatheter Q8H Suttle, Rosanne Ashing, MD   5 mL at 05/22/20 0639  . torsemide (DEMADEX) tablet 40 mg  40 mg Oral Daily Jennye Boroughs, MD   40 mg at 05/22/20 0908     Discharge Medications: Please see discharge summary for a list of discharge medications.  Relevant Imaging Results:  Relevant Lab Results:   Additional Information SS#: [id number]. No indication for HD right now. If she continues to not need HD by 10/1, the will remove tunneled catheter. Has a JP drain.  Candie Chroman, LCSW

## 2020-05-23 ENCOUNTER — Encounter
Admission: EM | Disposition: A | Discharge status: Skilled Nursing Facility | Payer: Self-pay | Source: Home / Self Care | Attending: Internal Medicine

## 2020-05-23 DIAGNOSIS — R6521 Severe sepsis with septic shock: Secondary | ICD-10-CM | POA: Diagnosis not present

## 2020-05-23 DIAGNOSIS — R0600 Dyspnea, unspecified: Secondary | ICD-10-CM

## 2020-05-23 DIAGNOSIS — G9341 Metabolic encephalopathy: Secondary | ICD-10-CM | POA: Diagnosis not present

## 2020-05-23 DIAGNOSIS — A419 Sepsis, unspecified organism: Secondary | ICD-10-CM | POA: Diagnosis not present

## 2020-05-23 DIAGNOSIS — K5720 Diverticulitis of large intestine with perforation and abscess without bleeding: Secondary | ICD-10-CM | POA: Diagnosis not present

## 2020-05-23 HISTORY — PX: DIALYSIS/PERMA CATHETER REMOVAL: CATH118289

## 2020-05-23 LAB — TYPE AND SCREEN
ABO/RH(D): O POS
Antibody Screen: NEGATIVE
Unit division: 0

## 2020-05-23 LAB — BASIC METABOLIC PANEL
Anion gap: 11 (ref 5–15)
BUN: 21 mg/dL — ABNORMAL HIGH (ref 6–20)
CO2: 28 mmol/L (ref 22–32)
Calcium: 6.9 mg/dL — ABNORMAL LOW (ref 8.9–10.3)
Chloride: 104 mmol/L (ref 98–111)
Creatinine, Ser: 1.67 mg/dL — ABNORMAL HIGH (ref 0.44–1.00)
GFR calc Af Amer: 39 mL/min — ABNORMAL LOW (ref >60)
GFR calc non Af Amer: 33 mL/min — ABNORMAL LOW (ref >60)
Glucose, Bld: 69 mg/dL — ABNORMAL LOW (ref 70–99)
Potassium: 3.2 mmol/L — ABNORMAL LOW (ref 3.5–5.1)
Sodium: 143 mmol/L (ref 135–145)

## 2020-05-23 LAB — HEMOGLOBIN AND HEMATOCRIT, BLOOD
HCT: 26.9 % — ABNORMAL LOW (ref 36.0–46.0)
Hemoglobin: 8.5 g/dL — ABNORMAL LOW (ref 12.0–15.0)

## 2020-05-23 LAB — CBC WITH DIFFERENTIAL/PLATELET
Abs Immature Granulocytes: 0.13 10*3/uL — ABNORMAL HIGH (ref 0.00–0.07)
Basophils Absolute: 0.1 10*3/uL (ref 0.0–0.1)
Basophils Relative: 1 %
Eosinophils Absolute: 0.2 10*3/uL (ref 0.0–0.5)
Eosinophils Relative: 1 %
HCT: 27.4 % — ABNORMAL LOW (ref 36.0–46.0)
Hemoglobin: 8.9 g/dL — ABNORMAL LOW (ref 12.0–15.0)
Immature Granulocytes: 1 %
Lymphocytes Relative: 14 %
Lymphs Abs: 1.6 10*3/uL (ref 0.7–4.0)
MCH: 29.9 pg (ref 26.0–34.0)
MCHC: 32.5 g/dL (ref 30.0–36.0)
MCV: 91.9 fL (ref 80.0–100.0)
Monocytes Absolute: 1.9 10*3/uL — ABNORMAL HIGH (ref 0.1–1.0)
Monocytes Relative: 16 %
Neutro Abs: 7.9 10*3/uL — ABNORMAL HIGH (ref 1.7–7.7)
Neutrophils Relative %: 67 %
Platelets: 296 10*3/uL (ref 150–400)
RBC: 2.98 MIL/uL — ABNORMAL LOW (ref 3.87–5.11)
RDW: 19 % — ABNORMAL HIGH (ref 11.5–15.5)
WBC: 11.8 10*3/uL — ABNORMAL HIGH (ref 4.0–10.5)
nRBC: 0.2 % (ref 0.0–0.2)

## 2020-05-23 LAB — BPAM RBC
Blood Product Expiration Date: 202110252359
ISSUE DATE / TIME: 202109231835
Unit Type and Rh: 5100

## 2020-05-23 LAB — RESPIRATORY PANEL BY RT PCR (FLU A&B, COVID)
Influenza A by PCR: NEGATIVE
Influenza B by PCR: NEGATIVE
SARS Coronavirus 2 by RT PCR: NEGATIVE

## 2020-05-23 LAB — MAGNESIUM: Magnesium: 1.6 mg/dL — ABNORMAL LOW (ref 1.7–2.4)

## 2020-05-23 SURGERY — DIALYSIS/PERMA CATHETER REMOVAL

## 2020-05-23 MED ORDER — CIPROFLOXACIN HCL 500 MG PO TABS: 500.0000 mg | ORAL_TABLET | Freq: Two times a day (BID) | ORAL | Status: AC

## 2020-05-23 MED ORDER — ALPRAZOLAM 1 MG PO TABS: 1.0000 mg | ORAL_TABLET | Freq: Three times a day (TID) | ORAL | 0 refills | Status: DC | PRN

## 2020-05-23 MED ORDER — MAGNESIUM SULFATE 2 GM/50ML IV SOLN
2.0000 g | Freq: Once | INTRAVENOUS | Status: AC
  Administered 2020-05-23: 2 g via INTRAVENOUS
  Filled 2020-05-23: qty 50

## 2020-05-23 MED ORDER — CIPROFLOXACIN HCL 500 MG PO TABS: 500.0000 mg | ORAL_TABLET | Freq: Two times a day (BID) | ORAL | Status: DC

## 2020-05-23 MED ORDER — LINEZOLID 600 MG PO TABS: 600.0000 mg | ORAL_TABLET | Freq: Two times a day (BID) | ORAL | 0 refills | Status: AC

## 2020-05-23 MED ORDER — TORSEMIDE 20 MG PO TABS: 40.0000 mg | ORAL_TABLET | Freq: Every day | ORAL | Status: DC

## 2020-05-23 MED ORDER — POTASSIUM CHLORIDE CRYS ER 20 MEQ PO TBCR: 40.0000 meq | EXTENDED_RELEASE_TABLET | Freq: Once | ORAL | Status: DC

## 2020-05-23 MED ORDER — MIRTAZAPINE 15 MG PO TABS: 15.0000 mg | ORAL_TABLET | Freq: Every day | ORAL | Status: DC

## 2020-05-23 MED ORDER — METRONIDAZOLE 500 MG PO TABS: 500.0000 mg | ORAL_TABLET | Freq: Three times a day (TID) | ORAL | 0 refills | Status: AC

## 2020-05-23 MED ORDER — ALPRAZOLAM 1 MG PO TABS: 1.0000 mg | ORAL_TABLET | Freq: Three times a day (TID) | ORAL | 0 refills | Status: AC | PRN

## 2020-05-23 MED ORDER — RILPIVIRINE HCL 25 MG PO TABS
25.0000 mg | ORAL_TABLET | Freq: Every day | ORAL | Status: DC
Start: 2020-05-24 — End: 2020-06-06

## 2020-05-23 MED ORDER — AMIODARONE HCL 200 MG PO TABS: 200.0000 mg | ORAL_TABLET | Freq: Every day | ORAL | Status: DC

## 2020-05-23 MED ORDER — DOLUTEGRAVIR SODIUM 50 MG PO TABS
50.0000 mg | ORAL_TABLET | Freq: Every day | ORAL | 0 refills | Status: DC
Start: 2020-05-24 — End: 2020-06-06

## 2020-05-23 MED ORDER — HYDROCODONE-ACETAMINOPHEN 5-325 MG PO TABS: 1.0000 | ORAL_TABLET | Freq: Four times a day (QID) | ORAL | 0 refills | Status: DC | PRN

## 2020-05-23 MED ORDER — HYDROCODONE-ACETAMINOPHEN 5-325 MG PO TABS: 1.0000 | ORAL_TABLET | Freq: Four times a day (QID) | ORAL | 0 refills | Status: AC | PRN

## 2020-05-23 MED ORDER — DRONABINOL 5 MG PO CAPS: 5.0000 mg | ORAL_CAPSULE | Freq: Two times a day (BID) | ORAL | 0 refills | Status: AC

## 2020-05-23 MED ORDER — DRONABINOL 5 MG PO CAPS
5.0000 mg | ORAL_CAPSULE | Freq: Two times a day (BID) | ORAL | 0 refills | Status: DC
Start: 2020-05-23 — End: 2020-05-23

## 2020-05-23 SURGICAL SUPPLY — 2 items
FORCEPS HALSTEAD CVD 5IN STRL (INSTRUMENTS) ×2 IMPLANT
TRAY LACERAT/PLASTIC (MISCELLANEOUS) ×2 IMPLANT

## 2020-05-23 NOTE — Progress Notes (Signed)
Pharmacy Antibiotic Note  Melanie Bowen is a 59 y.o. female admitted on 04/14/2020 with intra-abdominal infection.  Pharmacy has been consulted for ceftazidime dosing.  - 9/17 drain culture with Pseudomonas resistant to carbapenem.  Enterococcus isolated, resistant to ampicillin. - renal function improving (assessing need for HD daily - currently not required)   Plan: Continue Ceftazidime 2g IV q12h considering intra-abdominal infection and CrCl between 30 and 50  - monitor renal function throughout therapy  Height: 5\' 6"  (167.6 cm) Weight: 124.3 kg (274 lb 1.6 oz) IBW/kg (Calculated) : 59.3  Temp (24hrs), Avg:97.7 F (36.5 C), Min:97.5 F (36.4 C), Max:97.8 F (36.6 C)  Recent Labs  Lab 05/19/20 0653 05/20/20 0514 05/21/20 0613 05/22/20 0415 05/23/20 0440  WBC 27.6* 22.9* 19.0* 15.0* 11.8*  CREATININE 1.97* 1.87* 1.89* 1.84* 1.67*    Estimated Creatinine Clearance: 49.4 mL/min (A) (by C-G formula based on SCr of 1.67 mg/dL (H)).    Allergies  Allergen Reactions  . Acetaminophen Nausea And Vomiting  . Ibuprofen Other (See Comments)    Reports causes bleeding  . Gabapentin Rash  . Morphine Itching and Rash  . Morphine And Related Itching  . Zanaflex  [Tizanidine] Rash    9/17 JP drain: Pseudomonas aeruginosa, enterococcus 9/2 bcx: NG 8/24 Bcx > NG Final 8/17 wound cx > pseudomonas (pan-sensitive), ESBL E. Coli (resistant to all but imipenem, gentamicin, and Zosyn), Klebsiella (resistant to ampicillin/intermediate to Unasyn) 8/16 Bcx > NG final  8/17 Ucx > NG Final 8/20 Fungitell >> elevated 202 8/16 HIV RNA >> <20  HIV+ on Dolutegravir and rilpivirine  8/16 Genvoya >> 8/17 (& PTA) 8/16 Valtrex >> 8/18 (& PTA) 8/17 meropenem >> 9/6, 9/10 >>9/20 8/20 Levaquin x 1  8/17 anidulafungin >> 8/24, 9/3 >>9/20 8/16 CTX >> 8/17 8/16 Flagyl >> 8/17,  9/22 >> 8/16 Zosyn >> 8/17, 9/20 >>9/22 9/22 ceftazidime >> 9/22 linezolid >>   Thank you for allowing pharmacy  to be a part of this patient's care.  Rayna Sexton, PharmD, BCPS Clinical Pharmacist 05/23/2020 10:14 AM

## 2020-05-23 NOTE — Consult Note (Signed)
[facility] Admission History & Physical  MRN : [id number]  Melanie Bowen is a 59 y.o. (Jan 28, 1961) female who presents with chief complaint of permcath removal.  Chief Complaint  Patient presents with  . Abdominal Pain   History of Present Illness:  I am asked to evaluate the patient by her nephrologist. The patient is no longer requiring dialysis. Patient's last dialysis treatment was on 05/16/2020. There has been a return of the patient's renal function. The patient currently has a right chest permacath inserted and is no longer needed this. Patient denies pain or tenderness overlying the access.    Vascular surgery was consulted by Dr. Juleen China for PermCath removal.  Current Facility-Administered Medications  Medication Dose Route Frequency Provider Last Rate Last Admin  . 0.9 %  sodium chloride infusion (Manually program via Guardrails IV Fluids)   Intravenous Once Jennye Boroughs, MD      . 0.9 %  sodium chloride infusion  250 mL Intravenous Continuous Jennye Boroughs, MD 10 mL/hr at 05/03/20 0043 250 mL at 05/03/20 0043  . 0.9 %  sodium chloride infusion   Intravenous PRN Wyvonnia Dusky, MD   Stopped at 05/12/20 [phone]  . acetaminophen (TYLENOL) tablet 650 mg  650 mg Oral Q6H PRN Jennye Boroughs, MD   650 mg at 05/19/20 2351   Or  . acetaminophen (TYLENOL) suppository 650 mg  650 mg Rectal Q6H PRN Jennye Boroughs, MD      . albuterol (PROVENTIL) (2.5 MG/3ML) 0.083% nebulizer solution 2.5 mg  2.5 mg Nebulization Q4H PRN Jennye Boroughs, MD      . ALPRAZolam Duanne Moron) tablet 1 mg  1 mg Oral TID PRN Jennye Boroughs, MD   1 mg at 05/02/20 1056  . amiodarone (PACERONE) tablet 200 mg  200 mg Oral Daily Jennye Boroughs, MD   200 mg at 05/22/20 0908  . bisacodyl (DULCOLAX) EC tablet 10 mg  10 mg Oral Daily PRN Jennye Boroughs, MD   10 mg at 05/03/20 1558  . cefTAZidime (FORTAZ) 2 g in sodium chloride 0.9 % 100 mL IVPB  2 g Intravenous Q12H Berton Mount, RPH 200  mL/hr at 05/22/20 2235 2 g at 05/22/20 2235  . Chlorhexidine Gluconate Cloth 2 % PADS 6 each  6 each Topical Daily Jennye Boroughs, MD   6 each at 05/23/20 0431  . dextromethorphan-guaiFENesin (ROBITUSSIN-DM) 10-100 MG/5ML liquid 10 mL  10 mL Oral Q8H Jennye Boroughs, MD      . diphenhydrAMINE (BENADRYL) capsule 25 mg  25 mg Oral Q8H PRN Jennye Boroughs, MD   25 mg at 05/17/20 2147  . docusate sodium (COLACE) capsule 200 mg  200 mg Oral BID PRN Wyvonnia Dusky, MD      . rilpivirine [facility]) tablet 25 mg  25 mg Oral Q breakfast Tsosie Billing, MD   25 mg at 05/22/20 9233   And  . dolutegravir (TIVICAY) tablet 50 mg  50 mg Oral Q breakfast Tsosie Billing, MD   50 mg at 05/22/20 0908  . dronabinol (MARINOL) capsule 5 mg  5 mg Oral BID AC Sreenath, Sudheer B, MD   5 mg at 05/22/20 1809  . epoetin alfa (EPOGEN) injection 10,000 Units  10,000 Units Intravenous Q M,W,F-HD Jennye Boroughs, MD   10,000 Units at 05/16/20 1118  . famotidine (PEPCID) tablet 10 mg  10 mg Oral Q1200 Jennye Boroughs, MD   10 mg at 05/22/20 1508  . feeding supplement (ENSURE ENLIVE) (ENSURE ENLIVE) liquid 237 mL  237 mL Oral TID BM Wyvonnia Dusky, MD   237 mL at 05/21/20 1057  . fentaNYL (SUBLIMAZE) injection   Intravenous PRN Suzette Battiest, MD   25 mcg at 05/15/20 1614  . heparin injection 5,000 Units  5,000 Units Subcutaneous Q8H Jennye Boroughs, MD   5,000 Units at 05/23/20 0601  . heparin SCN flush for CVL/PCVC (NS + heparin 1 unit/ml)  0.5-1.7 mL Intravenous PRN Jennye Boroughs, MD   1.4 mL at 04/16/20 1717  . hydrocerin (EUCERIN) cream   Topical BID PRN Ralene Muskrat B, MD      . HYDROmorphone (DILAUDID) injection 0.5-1 mg  0.5-1 mg Intravenous Q2H PRN Jennye Boroughs, MD   1 mg at 05/06/20 1027  . iohexol (OMNIPAQUE) 9 MG/ML oral solution 1,000 mL  1,000 mL Oral Once PRN Jennye Boroughs, MD      . ipratropium-albuterol (DUONEB) 0.5-2.5 (3) MG/3ML nebulizer solution 3 mL  3 mL Nebulization Q6H PRN  Jennye Boroughs, MD   3 mL at 05/05/20 0911  . magic mouthwash  5 mL Oral QID PRN Jennye Boroughs, MD       And  . lidocaine (XYLOCAINE) 2 % viscous mouth solution 15 mL  15 mL Mouth/Throat QID PRN Jennye Boroughs, MD   15 mL at 05/03/20 1059  . linezolid (ZYVOX) tablet 600 mg  600 mg Oral Q12H Tsosie Billing, MD   600 mg at 05/22/20 2217  . magnesium hydroxide (MILK OF MAGNESIA) suspension 30 mL  30 mL Oral Daily PRN Jennye Boroughs, MD   30 mL at 05/03/20 1558  . menthol-cetylpyridinium (CEPACOL) lozenge 3 mg  1 lozenge Oral PRN Jennye Boroughs, MD   3 mg at 04/24/20 1059  . metoprolol tartrate (LOPRESSOR) injection 5 mg  5 mg Intravenous Q6H PRN Wyvonnia Dusky, MD   5 mg at 05/12/20 1540  . metroNIDAZOLE (FLAGYL) tablet 500 mg  500 mg Oral Q8H Tsosie Billing, MD   500 mg at 05/22/20 2217  . mirtazapine (REMERON) tablet 15 mg  15 mg Oral QHS Ralene Muskrat B, MD   15 mg at 05/22/20 2217  . mometasone-formoterol (DULERA) 200-5 MCG/ACT inhaler 2 puff  2 puff Inhalation BID Jennye Boroughs, MD   2 puff at 05/22/20 2218  . multivitamin (RENA-VIT) tablet 1 tablet  1 tablet Oral QHS Jennye Boroughs, MD   1 tablet at 05/22/20 2217  . multivitamin with minerals tablet 1 tablet  1 tablet Oral Daily Wyvonnia Dusky, MD   1 tablet at 05/22/20 1508  . oxyCODONE-acetaminophen (PERCOCET) 7.5-325 MG per tablet 1 tablet  1 tablet Oral Q6H PRN Jennye Boroughs, MD   1 tablet at 05/20/20 1709  . phenol (CHLORASEPTIC) mouth spray 1 spray  1 spray Mouth/Throat PRN Jennye Boroughs, MD   1 spray at 05/17/20 2147  . potassium chloride SA (KLOR-CON) CR tablet 40 mEq  40 mEq Oral Once Ralene Muskrat B, MD      . simethicone (MYLICON) chewable tablet 80 mg  80 mg Oral QID PRN Jennye Boroughs, MD   80 mg at 04/29/20 0918  . sodium chloride flush (NS) 0.9 % injection 5 mL  5 mL Intracatheter Q8H Suttle, Dylan J, MD   5 mL at 05/23/20 0600  . torsemide (DEMADEX) tablet 40 mg  40 mg Oral Daily Jennye Boroughs, MD   40 mg at 05/22/20 0086   Past Medical History:  Diagnosis Date  . Acute GI hemorrhage 12/2015   required transfusion  . Allergic  rhinitis   . Arthritis    DDD  . Asthma   . Blepharitis   . Bronchitis   . Cigarette smoker   . Claustrophobia   . COPD (chronic obstructive pulmonary disease) ([place])   . Cough   . DDD (degenerative disc disease), lumbar 04/10/2014  . Depression   . Diverticulosis   . Ectopic pregnancy   . Eye irritation   . Genital herpes   . GERD (gastroesophageal reflux disease)   . Headache   . HIV (human immunodeficiency virus infection) ([place])   . Hypertension   . Paresthesia of hand, bilateral   . Shortness of breath dyspnea    at times due to asthma   Past Surgical History:  Procedure Laterality Date  . BACK SURGERY  12/2014   lumbar lam. Dr. Deri Fuelling  . BACK SURGERY  12/25/2012   Removal lumbosubarachnoid shunt system, L5-S1 TF ESI, Dr. Virl Axe Minchew  . back surgery may 2016     lumbar   . BUNIONECTOMY Bilateral    feet  . COLONOSCOPY  12/06/2014   rescheduled from 11/01/2014 due to poor prep  . DIAGNOSTIC LAPAROSCOPY    . DIALYSIS/PERMA CATHETER INSERTION N/A 04/25/2020   Procedure: DIALYSIS/PERMA CATHETER INSERTION;  Surgeon: Algernon Huxley, MD;  Location: [facility];  Service: Cardiovascular;  Laterality: N/A;  . DISTAL FEMUR OSTEOTOMY Right    Dr. Earnestine Leys  . ESOPHAGOGASTRODUODENOSCOPY Left 01/11/2016   Procedure: ESOPHAGOGASTRODUODENOSCOPY (EGD);  Surgeon: Hulen Luster, MD;  Location[facility];  Service: Endoscopy;  Laterality: Left;  . Finger fracture Right    5th finger  . FRACTURE SURGERY Right 01/25/2014   closed reduction & percutaneous pinning of 5th digit  . HAND SURGERY Bilateral    carpal tunnel  . JOINT REPLACEMENT Bilateral    knees  . KNEE SURGERY Bilateral 2003,2007   total Joint  . LAPAROSCOPY FOR ECTOPIC PREGNANCY    . PERIPHERAL VASCULAR CATHETERIZATION N/A 01/12/2016   Procedure:  Visceral Angiography;  Surgeon: Algernon Huxley, MD;  Location: [facility];  Service: Cardiovascular;  Laterality: N/A;  . PERIPHERAL VASCULAR CATHETERIZATION N/A 01/12/2016   Procedure: Visceral Artery Intervention;  Surgeon: Algernon Huxley, MD;  Location: [facility];  Service: Cardiovascular;  Laterality: N/A;  . REVISION TOTAL KNEE ARTHROPLASTY Right 12/31/2002   Dr. Marry Guan  . TUBAL LIGATION    . VIDEO BRONCHOSCOPY N/A 06/30/2015   Procedure: VIDEO BRONCHOSCOPY WITHOUT FLUORO;  Surgeon: Vilinda Boehringer, MD;  Location: [facility];  Service: Cardiopulmonary;  Laterality: N/A;   Social History Social History   Tobacco Use  . Smoking status: Current Some Day Smoker    Packs/day: 0.25    Years: 38.00    Pack years: 9.50    Types: Cigarettes  . Smokeless tobacco: Never Used  . Tobacco comment: 1 cig daily  Vaping Use  . Vaping Use: Never used  Substance Use Topics  . Alcohol use: Yes    Alcohol/week: 5.0 standard drinks    Types: 5 Glasses of wine per week    Comment: occ  . Drug use: Not Currently   Family History Family History  Problem Relation Age of Onset  . Breast cancer Sister   No family history of bleeding or clotting disorders, autoimmune disease or porphyria  Allergies  Allergen Reactions  . Acetaminophen Nausea And Vomiting  . Ibuprofen Other (See Comments)    Reports causes bleeding  . Gabapentin Rash  . Morphine Itching  and Rash  . Morphine And Related Itching  . Zanaflex  [Tizanidine] Rash   REVIEW OF SYSTEMS (Negative unless checked)  Constitutional: [] Weight loss  [] Fever  [] Chills Cardiac: [] Chest pain   [] Chest pressure   [] Palpitations   [] Shortness of breath when laying flat   [] Shortness of breath at rest   [x] Shortness of breath with exertion. Vascular:  [] Pain in legs with walking   [] Pain in legs at rest   [] Pain in legs when laying flat   [] Claudication   [] Pain in feet when walking  [] Pain in feet at rest  [] Pain in feet when laying  flat   [] History of DVT   [] Phlebitis   [] Swelling in legs   [] Varicose veins   [] Non-healing ulcers Pulmonary:   [] Uses home oxygen   [] Productive cough   [] Hemoptysis   [] Wheeze  [] COPD   [] Asthma Neurologic:  [] Dizziness  [] Blackouts   [] Seizures   [] History of stroke   [] History of TIA  [] Aphasia   [] Temporary blindness   [] Dysphagia   [] Weakness or numbness in arms   [] Weakness or numbness in legs Musculoskeletal:  [] Arthritis   [] Joint swelling   [] Joint pain   [] Low back pain Hematologic:  [] Easy bruising  [] Easy bleeding   [] Hypercoagulable state   [] Anemic  [] Hepatitis Gastrointestinal:  [] Blood in stool   [] Vomiting blood  [] Gastroesophageal reflux/heartburn   [] Difficulty swallowing. Genitourinary:  [x] Chronic kidney disease   [] Difficult urination  [] Frequent urination  [] Burning with urination   [] Blood in urine Skin:  [] Rashes   [] Ulcers   [] Wounds Psychological:  [] History of anxiety   []  History of major depression.  Physical Examination  Vitals:   05/22/20 1908 05/22/20 1940 05/22/20 2224 05/23/20 0425  BP: (!) 110/52 (!) 106/51 (!) 109/50 112/67  Pulse: 81 79 78 82  Resp: 18 20  20   Temp: 97.7 F (36.5 C) (!) 97.5 F (36.4 C) 97.7 F (36.5 C) 97.7 F (36.5 C)  TempSrc: Oral Oral Oral Oral  SpO2: 100% 100% 100% 100%  Weight:      Height:       Body mass index is 44.24 kg/m. Gen: WD/WN, NAD Head: [place]/AT, No temporalis wasting. Prominent temp pulse not noted. Ear/Nose/Throat: Hearing grossly intact, nares w/o erythema or drainage, oropharynx w/o Erythema/Exudate,  Eyes: Conjunctiva clear, sclera non-icteric Neck: Trachea midline.  No JVD.  Pulmonary:  Good air movement, respirations not labored, no use of accessory muscles.  Cardiac: RRR, normal S1, S2. Vascular:  Vessel Right Left  Radial Palpable Palpable  Ulnar Not Palpable Not Palpable  Brachial Palpable Palpable  Carotid Palpable, without bruit Palpable, without bruit   Permcath: Right chest.  No signs of  infection to the area.  Gastrointestinal: soft, non-tender/non-distended. No guarding/reflex.  Musculoskeletal: M/S 5/5 throughout.  Extremities without ischemic changes.  No deformity or atrophy.  Neurologic: Sensation grossly intact in extremities.  Symmetrical.  Speech is fluent. Motor exam as listed above. Psychiatric: Judgment intact, Mood & affect appropriate for pt's clinical situation. Lymph : No Cervical, Axillary, or Inguinal lymphadenopathy.  CBC Lab Results  Component Value Date   WBC 11.8 (H) 05/23/2020   HGB 8.9 (L) 05/23/2020   HCT 27.4 (L) 05/23/2020   MCV 91.9 05/23/2020   PLT 296 05/23/2020   BMET    Component Value Date/Time   NA 143 05/23/2020 0440   NA 138 11/03/2017 1125   NA 139 12/21/2014 0710   K 3.2 (L) 05/23/2020 0440   K 3.6 12/21/2014 0710  CL 104 05/23/2020 0440   CL 108 12/21/2014 0710   CO2 28 05/23/2020 0440   CO2 24 12/21/2014 0710   GLUCOSE 69 (L) 05/23/2020 0440   GLUCOSE 85 12/21/2014 0710   BUN 21 (H) 05/23/2020 0440   BUN 13 11/03/2017 1125   BUN 16 12/21/2014 0710   CREATININE 1.67 (H) 05/23/2020 0440   CREATININE 0.96 12/21/2014 0710   CALCIUM 6.9 (L) 05/23/2020 0440   CALCIUM 8.4 (L) 12/21/2014 0710   GFRNONAA 33 (L) 05/23/2020 0440   GFRNONAA >60 12/21/2014 0710   GFRAA 39 (L) 05/23/2020 0440   GFRAA >60 12/21/2014 0710   Estimated Creatinine Clearance: 49.4 mL/min (A) (by C-G formula based on SCr of 1.67 mg/dL (H)).  COAG Lab Results  Component Value Date   INR 1.2 05/15/2020   INR 2.1 (H) 04/16/2020   INR 1.1 04/14/2020   Radiology CT ABDOMEN PELVIS WO CONTRAST  Result Date: 05/12/2020 CLINICAL DATA:  Intra-abdominal abscess, fever EXAM: CT ABDOMEN AND PELVIS WITHOUT CONTRAST TECHNIQUE: Multidetector CT imaging of the abdomen and pelvis was performed following the standard protocol without IV contrast. COMPARISON:  None. FINDINGS: Lower chest: The visualized lung bases are clear bilaterally. Cardiac blood pool is of  relatively low attenuation in keeping with at least moderate anemia. Global cardiac size within normal limits. Hepatobiliary: Hyperdensity of the gallbladder contents likely represents vicarious excretion of contrast. The gallbladder and liver are otherwise unremarkable. No intra or extrahepatic biliary ductal dilation. Pancreas: Unremarkable Spleen: Unremarkable Adrenals/Urinary Tract: Unremarkable Stomach/Bowel: There is extraluminal enteric contrast identified adjacent to the lateral segment of the left hepatic lobe which has increased in volume since prior examination. The exact source of leakage, however, is not clearly identified on this examination. The small bowel is diffusely mildly dilated containing both gas and enteric contrast in keeping with changes of a mild ileus. Oral contrast, however, does appear to extend into the terminal ileum. Adjacent to the terminal ileum is a loculated collection of extraluminal gas measuring 6.7 x 2.5 cm. The pigtail drainage catheter is just within this collection, unchanged from prior examination. The volume of gas is increased since prior examination, possibly the result of a flushing of the drainage catheter or a a continued enteric communication. There is extensive descending and sigmoid colonic diverticulosis without superimposed inflammatory change. Large bowel otherwise unremarkable. Appendix normal. There is mild ascites present, similar in volume to prior examination. Diffuse body wall edema has improved in the interval since prior examination. Vascular/Lymphatic: The abdominal vasculature is age-appropriate with moderate aortoiliac atherosclerotic calcification. No aneurysm. No pathologic adenopathy within the abdomen and pelvis. Reproductive: Uterus and bilateral adnexa are unremarkable. Other: Rectum unremarkable. Musculoskeletal: Lumbar fusion with instrumentation has been performed. No lytic or blastic bone lesions. IMPRESSION: 1. Extraluminal enteric  contrast adjacent to the lateral segment of the left hepatic lobe which has increased in volume since prior examination. The exact source of leakage, however, is not clearly identified on this examination. 2. Loculated collection of extraluminal gas adjacent to the terminal ileum. The pigtail drainage catheter is just within this collection, unchanged from prior examination. The volume of gas is increased since prior examination, possibly the result of a flushing of the drainage catheter or a continued enteric communication. 3. Mild ileus. 4. Improved body wall edema. Aortic Atherosclerosis (ICD10-I70.0). Electronically Signed   By: Fidela Salisbury MD   On: 05/12/2020 21:00   CT ABDOMEN PELVIS WO CONTRAST  Result Date: 04/29/2020 CLINICAL DATA:  Left lower quadrant abdominal pain.  History of diverticulitis complicated by abscess. EXAM: CT ABDOMEN AND PELVIS WITHOUT CONTRAST TECHNIQUE: Multidetector CT imaging of the abdomen and pelvis was performed following the standard protocol without IV contrast. COMPARISON:  CT abdomen pelvis dated 04/18/2020. FINDINGS: Lower chest: There is a moderate right pleural effusion with associated atelectasis. There is mild left basilar atelectasis. Hepatobiliary: No focal liver abnormality is seen. No gallstones, gallbladder wall thickening, or biliary dilatation. Pancreas: Unremarkable. No pancreatic ductal dilatation or surrounding inflammatory changes. Spleen: Normal in size without focal abnormality. Adrenals/Urinary Tract: Adrenal glands are unremarkable. Kidneys are normal, without renal calculi, focal lesion, or hydronephrosis. Bladder is unremarkable. Stomach/Bowel: Stomach is within normal limits. Enteric contrast reaches the rectum. Multiple mildly dilated loops of small bowel are seen in the mid abdomen without a transition point to decompressed distal bowel, similar to prior exam. Appendix appears normal. There is sigmoid diverticulosis without significant associated  inflammatory changes. Vascular/Lymphatic: Aortic atherosclerosis. No enlarged abdominal or pelvic lymph nodes. Reproductive: Uterus and bilateral adnexa are unremarkable. Other: There is a moderate volume intraperitoneal fluid, increased since 04/18/2020. No definite well-defined fluid collection to suggest abscess is identified, however evaluation is limited due to the lack of intravenous contrast. A locule of gas in the lower midline abdomen (series 2, image 67) is favored to be located within a loop of bowel. A pigtail catheter is seen in the lower abdomen, slightly retracted since prior exam. Anasarca is noted. No abdominal wall hernia. Musculoskeletal: Spinal fusion hardware is seen from L3-L5. Degenerative changes are seen in the spine. IMPRESSION: 1. Moderate volume intraperitoneal fluid, increased since 04/18/2020. No definite well-defined fluid collection to suggest abscess is identified, however evaluation is limited due to the lack of intravenous contrast. A pigtail catheter has been partially retracted but remains located in the lower abdomen. 2. Multiple mildly dilated loops of small bowel in the mid abdomen are similar to prior exam and likely reflect ileus. 3. Moderate right pleural effusion with associated atelectasis. Aortic Atherosclerosis (ICD10-I70.0). Electronically Signed   By: Zerita Boers M.D.   On: 04/29/2020 19:47   CT HEAD WO CONTRAST  Result Date: 05/02/2020 CLINICAL DATA:  Delirium. Increased lethargy and confusion following dialysis. EXAM: CT HEAD WITHOUT CONTRAST TECHNIQUE: Contiguous axial images were obtained from the base of the skull through the vertex without intravenous contrast. COMPARISON:  06/02/2019 FINDINGS: Brain: There is no evidence of an acute infarct, intracranial hemorrhage, mass, midline shift, or extra-axial fluid collection. Mild chronic small vessel ischemic changes are again noted including a chronic lacunar infarct in the left caudate head. A lacunar infarct  in the right caudate head is new but also chronic in appearance. The ventricles and sulci are normal. Vascular: Calcified atherosclerosis at the skull base. No hyperdense vessel. Skull: No fracture or suspicious osseous lesion. Sinuses/Orbits: Mild mucosal thickening in the paranasal sinuses with partially visualized small volume fluid in the right maxillary sinus. Clear mastoid air cells. Unremarkable orbits. Other: None. IMPRESSION: 1. No evidence of acute intracranial abnormality. 2. Mild chronic small vessel ischemia with chronic bilateral basal ganglia lacunar infarcts. Electronically Signed   By: Logan Bores M.D.   On: 05/02/2020 17:11   PERIPHERAL VASCULAR CATHETERIZATION  Result Date: 04/28/2020 See op note  DG Chest Port 1 View  Result Date: 05/05/2020 CLINICAL DATA:  Fever. EXAM: PORTABLE CHEST 1 VIEW COMPARISON:  April 28, 2020. FINDINGS: The heart size and mediastinal contours are within normal limits. No pneumothorax is noted. Right internal jugular dialysis catheter is unchanged in position.  Hypoinflation of the lungs is noted with mild bibasilar subsegmental atelectasis. Small pleural effusions may be present. Left perihilar opacity is noted concerning for atelectasis or possibly infiltrate. The visualized skeletal structures are unremarkable. IMPRESSION: Hypoinflation of the lungs is noted with mild bibasilar subsegmental atelectasis. Left perihilar opacity is noted concerning for atelectasis or possibly infiltrate. Electronically Signed   By: Marijo Conception M.D.   On: 05/05/2020 08:28   DG Chest Port 1 View  Result Date: 04/28/2020 CLINICAL DATA:  Sepsis. EXAM: PORTABLE CHEST 1 VIEW COMPARISON:  April 14, 2020. FINDINGS: The heart size and mediastinal contours are within normal limits. Both lungs are clear. No pneumothorax or pleural effusion is noted. Interval placement of right internal jugular catheter with distal tip in expected position of the SVC. The visualized skeletal  structures are unremarkable. IMPRESSION: No active disease. Electronically Signed   By: Marijo Conception M.D.   On: 04/28/2020 13:08   CT IMAGE GUIDED DRAINAGE BY PERCUTANEOUS CATHETER  Result Date: 05/16/2020 INDICATION: 59 year old female with history of diverticulitis complicated by intra-abdominal abscess with prior lower abdominal drain placement, now malpositioned on recent CT abdomen pelvis. Additionally there are new intra-abdominal fluid collections in the upper abdomen, largest in the left mid abdomen. EXAM: CT GUIDED aspiration OF abdominal fluid collection CT-guided exchange of indwelling abscess drain MEDICATIONS: The patient is currently admitted to the hospital and receiving intravenous antibiotics. The antibiotics were administered within an appropriate time frame prior to the initiation of the procedure. ANESTHESIA/SEDATION: 0 mg IV Versed 50 mcg IV Fentanyl Moderate Sedation Time:  0 The patient was continuously monitored during the procedure by the interventional radiology nurse under my direct supervision. COMPLICATIONS: None immediate. TECHNIQUE: Informed written consent was obtained from the patient after a thorough discussion of the procedural risks, benefits and alternatives. All questions were addressed. Maximal Sterile Barrier Technique was utilized including caps, mask, sterile gowns, sterile gloves, sterile drape, hand hygiene and skin antiseptic. A timeout was performed prior to the initiation of the procedure. PROCEDURE: The left upper quadrant and indwelling lower abdominal drain were prepped with Chlorhexidine in a sterile fashion, and a sterile drape was applied covering the operative fields. A sterile gown and sterile gloves were used for the procedure. Local anesthesia was provided with 1% Lidocaine. 87 gauge trocar needle was directed into the left abdominal fluid collection from an anterior approach. Aspiration was performed. A sample was collected and sent to lab for culture.  Next, the indwelling 12 [place] lower abdominal fluid collection drain was exchanged over an Amplatz wire for a 14 [place] drain. The pigtail 4 shin was locked in the deep part of the fluid collection. The drain was sutured in place and a dressing was applied. FINDINGS: Left abdominal fluid collection yielded approximately 250 mL of serous fluid. The fluid collection was essentially absent after aspiration. Given the non purulent nature of this fluid, no drain was placed. The indwelling lower abdominal drain appears in better position than comparison CT, with the pigtail portion in the fluid collection and the radiopaque marker deep to the rectus she. There was succus draining from around the catheter entry site. Successful exchange with 14 French drain, now position deeper within the fluid collection. IMPRESSION: 1. Left upper abdominal fluid collection yielded serous fluid, therefore no drain was placed. Recommend follow-up fluid culture. 2. Successful upsize and repositioning of the indwelling lower abdominal drain from 12 [place] to 14 [place]. Given feculent output around the drain and within the drain bulb  upon completion of the procedure, it is suspected that the draining fluid collection represents perforation of a diverticulum in the setting of diverticulitis. Electronically Signed   By: Ruthann Cancer MD   On: 05/16/2020 07:49    Assessment/Plan 1.  End-stage renal disease: Patient with history of end-stage renal disease requiring hemodialysis.  At this time, the patient's renal function has returned. She has a right IJ PermCath in place which she is no longer needed.  Consult by Dr. Juleen China for removal.  Procedure, risks and benefits were explained to the patient.  All questions were answered.  Patient wished to proceed.  2.  Hypertension:  Patient will continue medical management; nephrology is following no changes in oral medications.  3. Tobacco Abuse:   We had a discussion for approximately three  minutes regarding the absolute need for smoking cessation due to the deleterious nature of tobacco on the vascular system. We discussed the tobacco use would diminish patency of any intervention, and likely significantly worsen progression of disease. We discussed multiple agents for quitting including replacement therapy or medications to reduce cravings such as Chantix. The patient voices their understanding of the importance of smoking cessation.  Discussed with Dr. Mayme Genta, PA-C  05/23/2020 11:31 AM

## 2020-05-23 NOTE — Plan of Care (Signed)
Continuing with plan of care. 

## 2020-05-23 NOTE — Progress Notes (Signed)
Pharmacy Antibiotic Note  Melanie Bowen is a 59 y.o. female admitted on 04/14/2020 with intra-abdominal infection.  Pharmacy has been consulted for ciprofloxacin dosing.  - 9/17 drain culture with Pseudomonas resistant to carbapenem.  Enterococcus isolated, resistant to ampicillin. - renal function improving (assessing need for HD daily - currently not required)    Plan: Discontinue Ceftazidime 2g IV q12h  Ciprofloxacin PO 500 mg Q12H  Height: 5\' 6"  (167.6 cm) Weight: 124.3 kg (274 lb 1.6 oz) IBW/kg (Calculated) : 59.3  Temp (24hrs), Avg:97.7 F (36.5 C), Min:97.5 F (36.4 C), Max:97.8 F (36.6 C)  Recent Labs  Lab 05/19/20 0653 05/20/20 0514 05/21/20 0613 05/22/20 0415 05/23/20 0440  WBC 27.6* 22.9* 19.0* 15.0* 11.8*  CREATININE 1.97* 1.87* 1.89* 1.84* 1.67*    Estimated Creatinine Clearance: 49.4 mL/min (A) (by C-G formula based on SCr of 1.67 mg/dL (H)).    Allergies  Allergen Reactions  . Acetaminophen Nausea And Vomiting  . Ibuprofen Other (See Comments)    Reports causes bleeding  . Gabapentin Rash  . Morphine Itching and Rash  . Morphine And Related Itching  . Zanaflex  [Tizanidine] Rash    9/17 JP drain: Pseudomonas aeruginosa, enterococcus 9/2 bcx: NG 8/24 Bcx > NG Final 8/17 wound cx > pseudomonas (pan-sensitive), ESBL E. 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  Kristeen Miss,  PharmD 05/23/2020  3:29 PM

## 2020-05-23 NOTE — Progress Notes (Addendum)
[facility] SURGICAL PROGRESS NOTE (cpt [phone])  Hospital Day(s): 39.   Interval History:  Patient seen and examined no acute events or new complaints overnight. Patient reports she is doing well, no abdominal pain, No fever, chills, nausea, emesis. Leukocytosis continues downward trend, now 11.8K Renal function improvement has staled some, sCr - 1.67, UO - 500 ccs + unmeasured, has not required HD since 09/17 Mild hypomagnesemia to 1.6 & mild hypokalemia to 3.2 Drain output30 ccs,seropurulent Cx from 09/17 w/ PCN resistant enterococcus; ID switched ABx to ceftazidime, flagyl, po linezolid; plan for 10 days Tolerating regular diet, no issues Having bowel function Working with therapies; recommending SNF  Patient is extremely agitated and adamantly about leaving the hospital this morning. She states "she is not talking any more treatment unless she can go home." She states that "she can get all the help she needs at her daughter's house."   Review of Systems:  Constitutional: denies fever, chills  HEENT: denies cough or congestion  Respiratory: denies any shortness of breath  Cardiovascular: denies chest pain or palpitations  Gastrointestinal: denies abdominal pain, N/V, or diarrhea/and bowel function as per interval history Genitourinary: denies burning with urination or urinary frequency Musculoskeletal: denies pain, decreased motor or sensation   Vital signs in last 24 hours: [min-max] current  Temp:  [97.5 F (36.4 C)-97.8 F (36.6 C)] 97.7 F (36.5 C) (09/24 0425) Pulse Rate:  [78-92] 82 (09/24 0425) Resp:  [18-20] 20 (09/24 0425) BP: (106-112)/(50-67) 112/67 (09/24 0425) SpO2:  [100 %] 100 % (09/24 0425)     Height: 5\' 6"  (167.6 cm) Weight: 124.3 kg BMI (Calculated): 44.26   Intake/Output last 2 shifts:  09/23 0701 - 09/24 0700 In: 287 [P.O.:220; I.V.:15; Blood:418; IV Piggyback:100] Out: 38 [Urine:501; Drains:30; Stool:1]   Physical Exam:   Constitutional: alert, no distress HENT: normocephalic without obvious abnormality  Eyes: PERRL, EOM's grossly intact and symmetric  Respiratory: breathing non-labored at rest, on [place] Cardiovascular: regular rate and sinus rhythm  Gastrointestinal: soft,tenderness over suprapubic drain site, I am unable to illicit any additional tenderness, and non-distended, no rebound/guarding,certainly without evidence of peritonitis,JP in LLQ outputis seropurulent Musculoskeletal: no edema or wounds, motor and sensation grossly intact, NT   Labs:  CBC Latest Ref Rng & Units 05/23/2020 05/23/2020 05/22/2020  WBC 4.0 - 10.5 K/uL 11.8(H) - 15.0(H)  Hemoglobin 12.0 - 15.0 g/dL 8.9(L) 8.5(L) 7.0(L)  Hematocrit 36 - 46 % 27.4(L) 26.9(L) 22.1(L)  Platelets 150 - 400 K/uL 296 - 286   CMP Latest Ref Rng & Units 05/23/2020 05/22/2020 05/21/2020  Glucose 70 - 99 mg/dL 69(L) 74 62(L)  BUN 6 - 20 mg/dL 21(H) 24(H) 25(H)  Creatinine 0.44 - 1.00 mg/dL 1.67(H) 1.84(H) 1.89(H)  Sodium 135 - 145 mmol/L 143 139 140  Potassium 3.5 - 5.1 mmol/L 3.2(L) 3.5 3.3(L)  Chloride 98 - 111 mmol/L 104 102 101  CO2 22 - 32 mmol/L 28 27 28   Calcium 8.9 - 10.3 mg/dL 6.9(L) 6.8(L) 7.0(L)  Total Protein 6.5 - 8.1 g/dL - - -  Total Bilirubin 0.3 - 1.2 mg/dL - - -  Alkaline Phos 38 - 126 U/L - - -  AST 15 - 41 U/L - - -  ALT 0 - 44 U/L - - -     Imaging studies: No new pertinent imaging studies   Assessment/Plan: (ICD-10's: K57.20) 59 y.o.femalewith slow improvement, improvement in leukocytosis, admitted withsigmoid diverticulitis with abscess s/p percutaneous drainage on 08/16andshe continues to bewithout any evidence of peritonitis/pneumoperitoneum/pneumatosis  on examination or imaginghowever her drain output has now become feculentconcerning for colonic fistula however we appear to have better control of outputwith up-sizing drain on 09/16,complicated by pertinent comorbidities includingmarked COPD   - Patient is  adamant about discharging today and refuses to go to SNF. I do think she would benefit from rehab at discharge but at this point in her care, at least from our surgical perspective, we have nothing more to add. She will certainly need to maintain that drain for some time at home and will need re-imaging as an outpatient for this. I discussed this with medicine service who has reviewed home ABx with ID and vascular surgery can remove perm-cath today. I will update discharge instructions from surgery standpoint. Defer disposition decision to medicine service, but I agree she would best benefit from rehab at discharge.               - Okay to continue diet   - Continue IV + PO ABx (ceftazidime, flagyl, po linezolid --> Day 3/10); ID following; greatly appreciate assistance  - Monitor abdominal examination; leukocytosis - Pain control prn; antiemetics prn - Appreciate nephrology assistance and recommendations - Continue to work with therapies; suspect she will need significant rehab - Further management per primary service; we will continue to follow   All of the above findings and recommendations were discussed with the patient, and the medical team, and all of patient's questions were answered to her expressed satisfaction.  -- Edison Simon, PA-C [facility] 05/23/2020, 7:29 AM [phone] M-F: 7am - 4pm

## 2020-05-23 NOTE — Plan of Care (Signed)
Patient is in stable condition and all paperwork is ready for transport to the [facility].  Attempted to call facility multiple times to give report, but no one is answering the phone.  Will continue to call and notify charge nurse.

## 2020-05-23 NOTE — Op Note (Signed)
Operative Note   Preoperative diagnosis:    1. ESRD with functional permanent access  Postoperative diagnosis:   1. ESRD with functional permanent access  Procedure:  Removal of Right Permcath  Performing Clinician: Hezzie Bump PA-C  Supervising Surgeon:  Leotis Pain, MD  Anesthesia:  Local  EBL:  Minimal  Indication for the Procedure:  The patient has a functional permanent dialysis access and no longer needs their permcath.  This can be removed.  Risks and benefits are discussed and informed consent is obtained.  Description of the Procedure:  The patient's RIGHT neck, chest and existing catheter were sterilely prepped and draped. The area around the catheter was anesthetized copiously with 1% lidocaine. The catheter was dissected out with curved hemostats until the cuff was freed from the surrounding fibrous sheath. The fiber sheath was transected, and the catheter was then removed in its entirety using gentle traction. Pressure was held and sterile dressings were placed. The patient tolerated the procedure well and was taken to the recovery room in stable condition.  Mickey Esguerra A Shaquelle Hernon  05/23/2020, 2:43 PM  This note was created with Dragon Medical transcription system. Any errors in dictation are purely unintentional.
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
## 2020-05-23 NOTE — Progress Notes (Signed)
[facility]  ROUNDING NOTE   Subjective:   Patient is excited that she does not need dialysis anymore.   PRBC transfusion yesterday.   Objective:  Vital signs in last 24 hours:  Temp:  [97.5 F (36.4 C)-97.8 F (36.6 C)] 97.7 F (36.5 C) (09/24 0425) Pulse Rate:  [78-85] 82 (09/24 0425) Resp:  [18-20] 20 (09/24 0425) BP: (106-112)/(50-67) 112/67 (09/24 0425) SpO2:  [100 %] 100 % (09/24 0425)  Weight change:  Filed Weights   05/03/20 0547 05/09/20 0951 05/14/20 1349  Weight: 130.9 kg 119 kg 124.3 kg    Intake/Output: I/O last 3 completed shifts: In: 1425 [P.O.:660; I.V.:15; Blood:418; IV Piggyback:332] Out: 66 [Urine:901; Drains:60; Stool:1]   Intake/Output this shift:  Total I/O In: 100 [P.O.:100] Out: 2 [Urine:1; Stool:1]  Physical Exam: General: Alert, oriented  Head: Normocephalic, atraumatic. Moist oral mucosal membranes  Eyes: Anicteric  Neck: Supple  Lungs:  Diminished at the bases bilaterally  Heart: S1S2 + regular  Abdomen:  Soft, Non distended, non tender  Extremities:  No peripheral edema.  Neurologic: Alert, oriented   Skin: No acute lesions or rashes  Access: RIJ permcath     Basic Metabolic Panel: Recent Labs  Lab 05/19/20 0653 05/19/20 0653 05/20/20 0514 05/20/20 0514 05/21/20 0613 05/22/20 0415 05/23/20 0440  NA 138  --  139  --  140 139 143  K 3.3*  --  3.6  --  3.3* 3.5 3.2*  CL 101  --  102  --  101 102 104  CO2 28  --  29  --  28 27 28   GLUCOSE 84  --  76  --  62* 74 69*  BUN 25*  --  25*  --  25* 24* 21*  CREATININE 1.97*  --  1.87*  --  1.89* 1.84* 1.67*  CALCIUM 7.2*   < > 7.2*   < > 7.0* 6.8* 6.9*  MG  --   --   --   --   --  1.6* 1.6*   < > = values in this interval not displayed.    Liver Function Tests: No results for input(s): AST, ALT, ALKPHOS, BILITOT, PROT, ALBUMIN in the last 168 hours. No results for input(s): LIPASE, AMYLASE in the last 168 hours. No results for input(s): AMMONIA in the last 168  hours.  CBC: Recent Labs  Lab 05/19/20 0653 05/19/20 0653 05/20/20 0514 05/21/20 0613 05/22/20 0415 05/23/20 0006 05/23/20 0440  WBC 27.6*  --  22.9* 19.0* 15.0*  --  11.8*  NEUTROABS 19.9*  --  16.4* 14.1* 10.7*  --  7.9*  HGB 7.6*   < > 7.3* 7.8* 7.0* 8.5* 8.9*  HCT 23.0*   < > 22.8* 23.0* 22.1* 26.9* 27.4*  MCV 89.8  --  94.6 91.3 92.5  --  91.9  PLT 241  --  264 295 286  --  296   < > = values in this interval not displayed.    Cardiac Enzymes: No results for input(s): CKTOTAL, CKMB, CKMBINDEX, TROPONINI in the last 168 hours.  BNP: Invalid input(s): POCBNP  CBG: No results for input(s): GLUCAP in the last 168 hours.  Microbiology: Results for orders placed or performed during the hospital encounter of 04/14/20  SARS Coronavirus 2 by RT PCR (hospital order, performed in [facility] hospital lab) Nasopharyngeal Nasopharyngeal Swab     Status: None   Collection Time: 04/14/20  2:17 AM   Specimen: Nasopharyngeal Swab  Result Value Ref  Range Status   SARS Coronavirus 2 NEGATIVE NEGATIVE Final    Comment: (NOTE) SARS-CoV-2 target nucleic acids are NOT DETECTED.  The SARS-CoV-2 RNA is generally detectable in upper and lower respiratory specimens during the acute phase of infection. The lowest concentration of SARS-CoV-2 viral copies this assay can detect is 250 copies / mL. A negative result does not preclude SARS-CoV-2 infection and should not be used as the sole basis for treatment or other patient management decisions.  A negative result may occur with improper specimen collection / handling, submission of specimen other than nasopharyngeal swab, presence of viral mutation(s) within the areas targeted by this assay, and inadequate number of viral copies (<250 copies / mL). A negative result must be combined with clinical observations, patient history, and epidemiological information.  Fact Sheet for Patients:   StrictlyIdeas.no  Fact  Sheet for Healthcare Providers: BankingDealers.co.za  This test is not yet approved or  cleared by the [place] FDA and has been authorized for detection and/or diagnosis of SARS-CoV-2 by FDA under an Emergency Use Authorization (EUA).  This EUA will remain in effect (meaning this test can be used) for the duration of the COVID-19 declaration under Section 564(b)(1) of the Act, 21 U.S.C. section 360bbb-3(b)(1), unless the authorization is terminated or revoked sooner.  Performed at [facility], [address] [place], [place] [address]   CULTURE, BLOOD (ROUTINE X 2) w Reflex to ID Panel     Status: None   Collection Time: 04/14/20  8:14 AM   Specimen: BLOOD  Result Value Ref Range Status   Specimen Description BLOOD LEFT [facility]  Final   Special Requests   Final    BOTTLES DRAWN AEROBIC AND ANAEROBIC Blood Culture adequate volume   Culture   Final    NO GROWTH 5 DAYS Performed at [facility], [address] [place], [place] [address]    Report Status 04/19/2020 FINAL  Final  CULTURE, BLOOD (ROUTINE X 2) w Reflex to ID Panel     Status: None   Collection Time: 04/14/20  8:14 AM   Specimen: BLOOD  Result Value Ref Range Status   Specimen Description BLOOD LEFT WRIST  Final   Special Requests   Final    BOTTLES DRAWN AEROBIC AND ANAEROBIC Blood Culture adequate volume   Culture   Final    NO GROWTH 5 DAYS Performed at [facility], [address], [place], [place] [address]    Report Status 04/19/2020 FINAL  Final  Aerobic/Anaerobic Culture (surgical/deep wound)     Status: None   Collection Time: 04/14/20 11:14 AM   Specimen: Abscess  Result Value Ref Range Status   Specimen Description   Final    ABSCESS Performed at [facility], [address], [place], [place] [address]    Special Requests   Final    NONE Performed at [facility], [place], [place] [address]    Gram Stain    Final    NO WBC SEEN ABUNDANT GRAM NEGATIVE RODS FEW GRAM POSITIVE COCCI FEW GRAM POSITIVE RODS Performed at [facility], [place] [address], [place], [place] [address]    Culture   Final    ABUNDANT KLEBSIELLA PNEUMONIAE ABUNDANT PSEUDOMONAS AERUGINOSA MODERATE ESCHERICHIA COLI Confirmed Extended Spectrum Beta-Lactamase Producer (ESBL).  In bloodstream infections from ESBL organisms, carbapenems are preferred over piperacillin/tazobactam. They are shown to have a lower risk of mortality. MIXED ANAEROBIC FLORA PRESENT.  CALL LAB IF  FURTHER IID REQUIRED.    Report Status 04/20/2020 FINAL  Final   Organism ID, Bacteria KLEBSIELLA PNEUMONIAE  Final   Organism ID, Bacteria PSEUDOMONAS AERUGINOSA  Final   Organism ID, Bacteria ESCHERICHIA COLI  Final      Susceptibility   Escherichia coli - MIC*    AMPICILLIN >=32 RESISTANT Resistant     CEFAZOLIN >=64 RESISTANT Resistant     CEFEPIME 16 RESISTANT Resistant     CEFTAZIDIME RESISTANT Resistant     CEFTRIAXONE >=64 RESISTANT Resistant     CIPROFLOXACIN >=4 RESISTANT Resistant     GENTAMICIN <=1 SENSITIVE Sensitive     IMIPENEM <=0.25 SENSITIVE Sensitive     TRIMETH/SULFA >=320 RESISTANT Resistant     AMPICILLIN/SULBACTAM >=32 RESISTANT Resistant     PIP/TAZO 8 SENSITIVE Sensitive     * MODERATE ESCHERICHIA COLI   Klebsiella pneumoniae - MIC*    AMPICILLIN >=32 RESISTANT Resistant     CEFAZOLIN <=4 SENSITIVE Sensitive     CEFEPIME <=0.12 SENSITIVE Sensitive     CEFTAZIDIME <=1 SENSITIVE Sensitive     CEFTRIAXONE <=0.25 SENSITIVE Sensitive     CIPROFLOXACIN <=0.25 SENSITIVE Sensitive     GENTAMICIN <=1 SENSITIVE Sensitive     IMIPENEM <=0.25 SENSITIVE Sensitive     TRIMETH/SULFA <=20 SENSITIVE Sensitive     AMPICILLIN/SULBACTAM 16 INTERMEDIATE Intermediate     PIP/TAZO 8 SENSITIVE Sensitive     * ABUNDANT KLEBSIELLA PNEUMONIAE   Pseudomonas aeruginosa - MIC*    CEFTAZIDIME 4 SENSITIVE Sensitive     CIPROFLOXACIN <=0.25  SENSITIVE Sensitive     GENTAMICIN 2 SENSITIVE Sensitive     IMIPENEM 2 SENSITIVE Sensitive     PIP/TAZO 8 SENSITIVE Sensitive     CEFEPIME 4 SENSITIVE Sensitive     * ABUNDANT PSEUDOMONAS AERUGINOSA  MRSA PCR Screening     Status: None   Collection Time: 04/14/20  6:19 PM   Specimen: Nasopharyngeal  Result Value Ref Range Status   MRSA by PCR NEGATIVE NEGATIVE Final    Comment:        The GeneXpert MRSA Assay (FDA approved for NASAL specimens only), is one component of a comprehensive MRSA colonization surveillance program. It is not intended to diagnose MRSA infection nor to guide or monitor treatment for MRSA infections. Performed at [facility], [address] [place], [place] [address]   Urine Culture     Status: None   Collection Time: 04/15/20  4:28 AM   Specimen: Urine, Random  Result Value Ref Range Status   Specimen Description   Final    URINE, RANDOM Performed at [facility], [address] [place], [place] [address]    Special Requests   Final    NONE Performed at [facility], [address], [place], [place] [address]    Culture   Final    NO GROWTH Performed at [facility], [place] [address] [place], [place] [address]    Report Status 04/16/2020 FINAL  Final  CULTURE, BLOOD (ROUTINE X 2) w Reflex to ID Panel     Status: None   Collection Time: 04/22/20  6:12 PM   Specimen: BLOOD  Result Value Ref Range Status   Specimen Description BLOOD FOOT  Final   Special Requests   Final    BOTTLES DRAWN AEROBIC AND ANAEROBIC Blood Culture adequate volume   Culture   Final    NO GROWTH 5 DAYS Performed at [facility], [place],  [place] [address]    Report Status 04/27/2020 FINAL  Final  CULTURE, BLOOD (ROUTINE X 2) w Reflex to ID Panel     Status: None   Collection Time: 04/22/20  6:17 PM   Specimen: BLOOD  Result Value Ref Range Status   Specimen Description BLOOD FOOT  Final   Special  Requests   Final    BOTTLES DRAWN AEROBIC AND ANAEROBIC Blood Culture results may not be optimal due to an excessive volume of blood received in culture bottles   Culture   Final    NO GROWTH 5 DAYS Performed at [facility], [address] [place], [place] [address]    Report Status 04/27/2020 FINAL  Final  CULTURE, BLOOD (ROUTINE X 2) w Reflex to ID Panel     Status: None   Collection Time: 05/01/20  2:50 PM   Specimen: BLOOD  Result Value Ref Range Status   Specimen Description BLOOD BLOOD LEFT HAND  Final   Special Requests   Final    BOTTLES DRAWN AEROBIC AND ANAEROBIC Blood Culture adequate volume   Culture   Final    NO GROWTH 5 DAYS Performed at [facility], [address] [place], [place] [address]    Report Status 05/06/2020 FINAL  Final  CULTURE, BLOOD (ROUTINE X 2) w Reflex to ID Panel     Status: None   Collection Time: 05/01/20  2:51 PM   Specimen: BLOOD  Result Value Ref Range Status   Specimen Description BLOOD BLOOD RIGHT HAND  Final   Special Requests   Final    BOTTLES DRAWN AEROBIC AND ANAEROBIC Blood Culture adequate volume   Culture   Final    NO GROWTH 5 DAYS Performed at [facility], [address], [place], [place] [address]    Report Status 05/06/2020 FINAL  Final  Aerobic/Anaerobic Culture (surgical/deep wound)     Status: None   Collection Time: 05/15/20  4:36 PM   Specimen: Abdomen; Abdominal Fluid  Result Value Ref Range Status   Specimen Description   Final    ABDOMEN Performed at [facility], [address], [place], [place] [address]    Special Requests   Final    ABDOMINAL FLUID Performed at [facility], [address] [place], [place] [address]    Gram Stain   Final    RARE WBC PRESENT, PREDOMINANTLY MONONUCLEAR NO ORGANISMS SEEN    Culture   Final    No growth aerobically or anaerobically. Performed at [facility], [place] [address], [place], [place] [address]    Report  Status 05/20/2020 FINAL  Final  Body fluid culture     Status: None   Collection Time: 05/16/20  6:37 PM   Specimen: JP Drain; Body Fluid  Result Value Ref Range Status   Specimen Description   Final    JP DRAINAGE Performed at [facility], [address] [place], [place] [address]    Special Requests   Final    NONE Performed at [facility], [place], [place] [address]    Gram Stain   Final    FEW WBC PRESENT,BOTH PMN AND MONONUCLEAR ABUNDANT GRAM POSITIVE COCCI MODERATE GRAM NEGATIVE RODS Performed at [facility], [place] [address], [place],  [address]    Culture   Final    ABUNDANT PSEUDOMONAS AERUGINOSA ABUNDANT ENTEROCOCCUS SPECIES    Report Status 05/20/2020 FINAL  Final   Organism ID,  Bacteria PSEUDOMONAS AERUGINOSA  Final   Organism ID, Bacteria ENTEROCOCCUS SPECIES  Final      Susceptibility   Enterococcus species - MIC*    AMPICILLIN 16 RESISTANT Resistant     VANCOMYCIN 2 SENSITIVE Sensitive     GENTAMICIN SYNERGY SENSITIVE Sensitive     * ABUNDANT ENTEROCOCCUS SPECIES   Pseudomonas aeruginosa - MIC*    CEFTAZIDIME 4 SENSITIVE Sensitive     CIPROFLOXACIN 1 SENSITIVE Sensitive     GENTAMICIN 8 INTERMEDIATE Intermediate     IMIPENEM >=16 RESISTANT Resistant     PIP/TAZO 8 SENSITIVE Sensitive     * ABUNDANT PSEUDOMONAS AERUGINOSA  Fungus Culture With Stain     Status: Abnormal (Preliminary result)   Collection Time: 05/16/20  6:37 PM   Specimen: JP Drain  Result Value Ref Range Status   Fungus Stain Final report (A)  Final    Comment: (NOTE) Performed At: [facility] [address] [place], [place] [id number] Rush Farmer MD GQ:[id number]    Fungus (Mycology) Culture PENDING  Incomplete   Fungal Source JP DRAINAGE  Final    Comment: Performed at [facility], [address] [place], [place] [address]  Fungus Culture Result     Status: Abnormal   Collection Time: 05/16/20  6:37 PM   Result Value Ref Range Status   Result 1 Yeast observed (A)  Final    Comment: (NOTE) Performed At: [facility] [place], [place] [id number] Rush Farmer MD SN:[id number]     Coagulation Studies: No results for input(s): LABPROT, INR in the last 72 hours.  Urinalysis: No results for input(s): COLORURINE, LABSPEC, PHURINE, GLUCOSEU, HGBUR, BILIRUBINUR, KETONESUR, PROTEINUR, UROBILINOGEN, NITRITE, LEUKOCYTESUR in the last 72 hours.  Invalid input(s): APPERANCEUR    Imaging: No results found.   Medications:   . sodium chloride 250 mL (05/03/20 0043)  . sodium chloride Stopped (05/12/20 0620)  . cefTAZidime (FORTAZ)  IV 2 g (05/22/20 2235)  . magnesium sulfate bolus IVPB     . sodium chloride   Intravenous Once  . amiodarone  200 mg Oral Daily  . Chlorhexidine Gluconate Cloth  6 each Topical Daily  . dextromethorphan-guaiFENesin  10 mL Oral Q8H  . rilpivirine  25 mg Oral Q breakfast   And  . dolutegravir  50 mg Oral Q breakfast  . dronabinol  5 mg Oral BID AC  . epoetin (EPOGEN/PROCRIT) injection  10,000 Units Intravenous Q M,W,F-HD  . famotidine  10 mg Oral Q1200  . feeding supplement (ENSURE ENLIVE)  237 mL Oral TID BM  . heparin injection (subcutaneous)  5,000 Units Subcutaneous Q8H  . linezolid  600 mg Oral Q12H  . metroNIDAZOLE  500 mg Oral Q8H  . mirtazapine  15 mg Oral QHS  . mometasone-formoterol  2 puff Inhalation BID  . multivitamin  1 tablet Oral QHS  . multivitamin with minerals  1 tablet Oral Daily  . potassium chloride  40 mEq Oral Once  . sodium chloride flush  5 mL Intracatheter Q8H  . torsemide  40 mg Oral Daily   sodium chloride, acetaminophen **OR** acetaminophen, albuterol, ALPRAZolam, bisacodyl, diphenhydrAMINE, docusate sodium, fentaNYL, heparin NICU/SCN flush, hydrocerin, HYDROmorphone (DILAUDID) injection, iohexol, ipratropium-albuterol, magic mouthwash **AND** lidocaine, magnesium hydroxide, menthol-cetylpyridinium,  metoprolol tartrate, oxyCODONE-acetaminophen, phenol, simethicone  Assessment/ Plan:  [name] is a 59 y.o.  female with HIV, COPD, HTN , GERD, who is admitted for Dyspnea and  colonic diverticular abcess.    1. AKI requiring  hemodialysis. Last hemodialysis treatment was 9/17.Baseline creatinine of 1 with normal GFR on 11/08/2019.  - Vascular to remove dialysis access - Patient may have a new baseline creatinine.   2. Anemia of CKD: Required blood transfusions during the admission - subcu epo given on 9/23.    3. Hypertension - torsemide.      LOS: [place] 9/24/202112:27 PM

## 2020-05-23 NOTE — Discharge Summary (Signed)
Physician Discharge Summary  Melanie Bowen WRU:[id number] DOB: February 21, 1961 DOA: 04/14/2020  PCP: Theotis Burrow, MD  Admit date: 04/14/2020 Discharge date: 05/23/2020  Admitted From: Home Disposition: SNF  Recommendations for Outpatient Follow-up:  1. Follow up with PCP in 1-2 weeks 2. Follow-up with surgery in 2 weeks 3. Follow-up in [facility] for [place]: No Equipment/Devices: Oxygen 2 L Discharge Condition: Stable CODE STATUS: Partial Diet recommendation: Heart Healthy / Carb Modified   Brief/Interim Summary: Melanie Pancoast Russellis a 59 y.o.femalewith medical history significant for hypertension, diverticulosis, enteric infection, morbid obesity, GERD,COPD,HIV infection,history of stroke,asthma,depression, seizures enzymes with acute onset of abdominal pain nausea vomitingandconstipation.  She was admitted to the hospital for septic shock secondary to acute diverticulitis with diverticular abscess. She was treated with IV fluids and empiric IV antimicrobial therapy. She required vasopressors for septic shock. She was seen by interventional radiologist and CT-guided drainage was done by IR on 04/14/2020. Fluid culture showed Pseudomonas aeruginosa, Klebsiella pneumonia and E. coli. Septic shock was complicated by acute kidney injury, acute liver failure, acute metabolic encephalopathy and acute hypoxemic respiratory failure. She was seen by the nephrologist for AKI. Patient was started on hemodialysis for AKI. She developed atrial fibrillation with RVR. She was seen in consultation by the cardiologist as well.She remains on amiodarone for atrial fibrillation.  Because of persistent leukocytosis and recurrent fever, patient underwent upsizing and repositioning of indwelling lower abdominal drain from 12 [place] to 14 [place] on 05/15/2020. There was concern for suspected perforation of diverticulum on imaging.  She has been  transfused with about 4 units of packed red blood cells thus far for severe anemia.  Surgery following.  Appears to be slowly improving.  On 05/23/2020 the patient was adamant about going home.  I lengthy conversation with her and explained that she would likely not do well at home with strongly recommended rehab placement.  After repeated conversation she was in agreement.  Family was also in agreement.  Surgery was contacted and from their standpoint no further intervention.  Drain to remain in place.  Will follow up with general surgery.  Will follow up with general surgery post discharge to discuss length of drain maintenance.  Vascular surgery was consulted for PermCath removal.  Patient will no longer need dialysis.  No follow-up with nephrology indicated.  Infectious disease contacted prior to discharge.  Recommended switching to oral antibiotics for additional 6 days including ciprofloxacin, linezolid, Flagyl.  Prescriptions included in discharge medication reconciliation.  The patient's home HIV treatment was changed due to her acute kidney injury.  She will continue on the updated HIV treatment post discharge.  She will follow up in [facility] for further discussion regarding changing back to original therapy.  Discharge Diagnoses:  Principal Problem:   Severe sepsis with septic shock ([place]) Active Problems:   HTN (hypertension)   HIV (human immunodeficiency virus infection) ([place])   Diverticulitis of large intestine with abscess without bleeding   Abscess of sigmoid colon due to diverticulitis   Acute renal failure (ARF) ([place])   Hypotension   Diverticulosis   Acute respiratory distress   DNR (do not resuscitate) discussion   Palliative care by specialist   Dyspnea   Goals of care, counseling/discussion   Pressure injury of skin  Acute sigmoid diverticulitis and diverticular abscess s/p CT-guided drainage by IR on 04/14/2020. Fluid cultures with Klebsiella pneumonia,  Pseudomonas, E. Coli Appears to be slowly improving Responding to drain upsizing All culture  data received No further surgical intervention necessary Stable for discharge from surgical standpoint Follow-up in surgery clinic in 2 weeks Prescribed Zyvox, Cipro, Flagyl and discharge for additional 6 days Discharge to skilled nursing facility  Septic shock Resolved Antibiotics as above  Acute anemia Multifactorial in the setting of poor p.o. intake and acute illness Status post transfusion of packed red blood cells on 05/01/2020, 05/04/2020, 05/09/2020, 05/15/2020 05/22/2020: Hemoglobin 7.0.  Will transfuse an additional unit packed red blood cells Suspect anemia due to acute and chronic illnesses.  No indication for transfusion at time of discharge  Acute kidney injury requiring hemodialysis, resolved Hyperkalemia, resolved Metabolic acidosis, resolved Renal function is improved substantially Nephrology following but no current plans for i ongoing hemodialysis Patient urine output has been adequate PermCath removed prior to discharge per vascular surgery  Atrial fibrillation with rapid ventricular response, improved Currently rate and rhythm controlled on amiodarone Defer anticoagulation at this time given acute illness Can consider anticoagulation at a later time although at this moment I feel risks outweigh benefit  COPD exacerbation, resolved Acute hypoxemic respiratory failure, resolved Vital signs per unit protocol Oxygen as needed  HIV infection Home antiretroviral discontinued due to acute kidney injury.  Patient will go on alternative HIV treatment per infectious disease recommendations.  She will continue this treatment on discharge.  She will follow up in [facility] for further discussion regarding treatment and potential switching back to her original therapy  Acute liver failure, resolved In the setting of acute illness  Acute metabolic/toxic  encephalopathy Mental status at baseline currently  Discharge Instructions  Discharge Instructions    Diet - low sodium heart healthy   Complete by: As directed    Increase activity slowly   Complete by: As directed    No wound care   Complete by: As directed      Allergies as of 05/23/2020      Reactions   Acetaminophen Nausea And Vomiting   Ibuprofen Other (See Comments)   Reports causes bleeding   Gabapentin Rash   Morphine Itching, Rash   Morphine And Related Itching   Zanaflex  [tizanidine] Rash      Medication List    STOP taking these medications   elvitegravir-cobicistat-emtricitabine-tenofovir 150-150-200-10 MG Tabs tablet Commonly known as: GENVOYA   hydrochlorothiazide 25 MG tablet Commonly known as: HYDRODIURIL   losartan 50 MG tablet Commonly known as: COZAAR   traMADol 50 MG tablet Commonly known as: ULTRAM   valACYclovir 1000 MG tablet Commonly known as: VALTREX     TAKE these medications   albuterol 108 (90 Base) MCG/ACT inhaler Commonly known as: VENTOLIN HFA Inhale 2 puffs into the lungs every 6 (six) hours as needed for wheezing or shortness of breath.   ALPRAZolam 1 MG tablet Commonly known as: XANAX Take 1 tablet (1 mg total) by mouth 3 (three) times daily as needed for up to 3 days for anxiety.   amiodarone 200 MG tablet Commonly known as: PACERONE Take 1 tablet (200 mg total) by mouth daily. Start taking on: May 24, 2020   amLODipine 10 MG tablet Commonly known as: NORVASC Take 10 mg by mouth daily.   ciprofloxacin 500 MG tablet Commonly known as: CIPRO Take 1 tablet (500 mg total) by mouth 2 (two) times daily for 6 days.   Dexilant 60 MG capsule Generic drug: dexlansoprazole Take 60 mg by mouth daily.   dolutegravir 50 MG tablet Commonly known as: TIVICAY Take 1 tablet (50 mg total) by  mouth daily with breakfast. Start taking on: May 24, 2020   dronabinol 5 MG capsule Commonly known as: MARINOL Take 1  capsule (5 mg total) by mouth 2 (two) times daily before lunch and supper for 3 days.   famotidine 20 MG tablet Commonly known as: PEPCID Take 20 mg by mouth daily.   gabapentin 600 MG tablet Commonly known as: NEURONTIN Take 600 mg by mouth 3 (three) times daily.   HYDROcodone-acetaminophen 5-325 MG tablet Commonly known as: NORCO/VICODIN Take 1 tablet by mouth every 6 (six) hours as needed for up to 3 days for severe pain.   linezolid 600 MG tablet Commonly known as: ZYVOX Take 1 tablet (600 mg total) by mouth every 12 (twelve) hours for 6 days.   methocarbamol 500 MG tablet Commonly known as: ROBAXIN Take 500 mg by mouth in the morning, at noon, in the evening, and at bedtime.   metroNIDAZOLE 500 MG tablet Commonly known as: FLAGYL Take 1 tablet (500 mg total) by mouth every 8 (eight) hours for 6 days.   mirtazapine 15 MG tablet Commonly known as: REMERON Take 1 tablet (15 mg total) by mouth at bedtime.   rilpivirine 25 MG Tabs tablet Commonly known as: EDURANT Take 1 tablet (25 mg total) by mouth daily with breakfast. Start taking on: May 24, 2020   torsemide 20 MG tablet Commonly known as: DEMADEX Take 2 tablets (40 mg total) by mouth daily. Start taking on: May 24, 2020   Wixela Inhub 250-50 MCG/DOSE Aepb Generic drug: Fluticasone-Salmeterol Inhale 1 puff into the lungs 2 (two) times daily.       Follow-up Information    Ronny Bacon, MD. Schedule an appointment as soon as possible for a visit in 2 week(s).   Specialty: General Surgery Why: Complicated diverticulitis has drain Contact information: [address] [place] [address] [phone]        Revelo, Elyse Jarvis, MD. Schedule an appointment as soon as possible for a visit in 1 week(s).   Specialty: Family Medicine Contact information: [address] [place] [address] [place], [facility]. Schedule an  appointment as soon as possible for a visit in 2 week(s).   Why: To discuss your HIV treatment Contact information: [place] [address] [phone]              Allergies  Allergen Reactions  . Acetaminophen Nausea And Vomiting  . Ibuprofen Other (See Comments)    Reports causes bleeding  . Gabapentin Rash  . Morphine Itching and Rash  . Morphine And Related Itching  . Zanaflex  [Tizanidine] Rash    Consultations:  Nephrology  General surgery  Vascular surgery  Infectious disease  Palliative care   Procedures/Studies: CT ABDOMEN PELVIS WO CONTRAST  Result Date: 05/12/2020 CLINICAL DATA:  Intra-abdominal abscess, fever EXAM: CT ABDOMEN AND PELVIS WITHOUT CONTRAST TECHNIQUE: Multidetector CT imaging of the abdomen and pelvis was performed following the standard protocol without IV contrast. COMPARISON:  None. FINDINGS: Lower chest: The visualized lung bases are clear bilaterally. Cardiac blood pool is of relatively low attenuation in keeping with at least moderate anemia. Global cardiac size within normal limits. Hepatobiliary: Hyperdensity of the gallbladder contents likely represents vicarious excretion of contrast. The gallbladder and liver are otherwise unremarkable. No intra or extrahepatic biliary ductal dilation. Pancreas: Unremarkable Spleen: Unremarkable Adrenals/Urinary Tract: Unremarkable Stomach/Bowel: There is extraluminal enteric contrast identified adjacent to the lateral segment  of the left hepatic lobe which has increased in volume since prior examination. The exact source of leakage, however, is not clearly identified on this examination. The small bowel is diffusely mildly dilated containing both gas and enteric contrast in keeping with changes of a mild ileus. Oral contrast, however, does appear to extend into the terminal ileum. Adjacent to the terminal ileum is a loculated collection of extraluminal gas measuring 6.7 x 2.5 cm. The pigtail  drainage catheter is just within this collection, unchanged from prior examination. The volume of gas is increased since prior examination, possibly the result of a flushing of the drainage catheter or a a continued enteric communication. There is extensive descending and sigmoid colonic diverticulosis without superimposed inflammatory change. Large bowel otherwise unremarkable. Appendix normal. There is mild ascites present, similar in volume to prior examination. Diffuse body wall edema has improved in the interval since prior examination. Vascular/Lymphatic: The abdominal vasculature is age-appropriate with moderate aortoiliac atherosclerotic calcification. No aneurysm. No pathologic adenopathy within the abdomen and pelvis. Reproductive: Uterus and bilateral adnexa are unremarkable. Other: Rectum unremarkable. Musculoskeletal: Lumbar fusion with instrumentation has been performed. No lytic or blastic bone lesions. IMPRESSION: 1. Extraluminal enteric contrast adjacent to the lateral segment of the left hepatic lobe which has increased in volume since prior examination. The exact source of leakage, however, is not clearly identified on this examination. 2. Loculated collection of extraluminal gas adjacent to the terminal ileum. The pigtail drainage catheter is just within this collection, unchanged from prior examination. The volume of gas is increased since prior examination, possibly the result of a flushing of the drainage catheter or a continued enteric communication. 3. Mild ileus. 4. Improved body wall edema. Aortic Atherosclerosis (ICD10-I70.0). Electronically Signed   By: Fidela Salisbury MD   On: 05/12/2020 21:00   CT ABDOMEN PELVIS WO CONTRAST  Result Date: 04/29/2020 CLINICAL DATA:  Left lower quadrant abdominal pain. History of diverticulitis complicated by abscess. EXAM: CT ABDOMEN AND PELVIS WITHOUT CONTRAST TECHNIQUE: Multidetector CT imaging of the abdomen and pelvis was performed following the  standard protocol without IV contrast. COMPARISON:  CT abdomen pelvis dated 04/18/2020. FINDINGS: Lower chest: There is a moderate right pleural effusion with associated atelectasis. There is mild left basilar atelectasis. Hepatobiliary: No focal liver abnormality is seen. No gallstones, gallbladder wall thickening, or biliary dilatation. Pancreas: Unremarkable. No pancreatic ductal dilatation or surrounding inflammatory changes. Spleen: Normal in size without focal abnormality. Adrenals/Urinary Tract: Adrenal glands are unremarkable. Kidneys are normal, without renal calculi, focal lesion, or hydronephrosis. Bladder is unremarkable. Stomach/Bowel: Stomach is within normal limits. Enteric contrast reaches the rectum. Multiple mildly dilated loops of small bowel are seen in the mid abdomen without a transition point to decompressed distal bowel, similar to prior exam. Appendix appears normal. There is sigmoid diverticulosis without significant associated inflammatory changes. Vascular/Lymphatic: Aortic atherosclerosis. No enlarged abdominal or pelvic lymph nodes. Reproductive: Uterus and bilateral adnexa are unremarkable. Other: There is a moderate volume intraperitoneal fluid, increased since 04/18/2020. No definite well-defined fluid collection to suggest abscess is identified, however evaluation is limited due to the lack of intravenous contrast. A locule of gas in the lower midline abdomen (series 2, image 67) is favored to be located within a loop of bowel. A pigtail catheter is seen in the lower abdomen, slightly retracted since prior exam. Anasarca is noted. No abdominal wall hernia. Musculoskeletal: Spinal fusion hardware is seen from L3-L5. Degenerative changes are seen in the spine. IMPRESSION: 1. Moderate volume intraperitoneal fluid, increased  since 04/18/2020. No definite well-defined fluid collection to suggest abscess is identified, however evaluation is limited due to the lack of intravenous contrast.  A pigtail catheter has been partially retracted but remains located in the lower abdomen. 2. Multiple mildly dilated loops of small bowel in the mid abdomen are similar to prior exam and likely reflect ileus. 3. Moderate right pleural effusion with associated atelectasis. Aortic Atherosclerosis (ICD10-I70.0). Electronically Signed   By: Zerita Boers M.D.   On: 04/29/2020 19:47   CT HEAD WO CONTRAST  Result Date: 05/02/2020 CLINICAL DATA:  Delirium. Increased lethargy and confusion following dialysis. EXAM: CT HEAD WITHOUT CONTRAST TECHNIQUE: Contiguous axial images were obtained from the base of the skull through the vertex without intravenous contrast. COMPARISON:  06/02/2019 FINDINGS: Brain: There is no evidence of an acute infarct, intracranial hemorrhage, mass, midline shift, or extra-axial fluid collection. Mild chronic small vessel ischemic changes are again noted including a chronic lacunar infarct in the left caudate head. A lacunar infarct in the right caudate head is new but also chronic in appearance. The ventricles and sulci are normal. Vascular: Calcified atherosclerosis at the skull base. No hyperdense vessel. Skull: No fracture or suspicious osseous lesion. Sinuses/Orbits: Mild mucosal thickening in the paranasal sinuses with partially visualized small volume fluid in the right maxillary sinus. Clear mastoid air cells. Unremarkable orbits. Other: None. IMPRESSION: 1. No evidence of acute intracranial abnormality. 2. Mild chronic small vessel ischemia with chronic bilateral basal ganglia lacunar infarcts. Electronically Signed   By: Logan Bores M.D.   On: 05/02/2020 17:11   PERIPHERAL VASCULAR CATHETERIZATION  Result Date: 05/23/2020 See op note  PERIPHERAL VASCULAR CATHETERIZATION  Result Date: 04/28/2020 See op note  DG Chest Port 1 View  Result Date: 05/05/2020 CLINICAL DATA:  Fever. EXAM: PORTABLE CHEST 1 VIEW COMPARISON:  April 28, 2020. FINDINGS: The heart size and mediastinal  contours are within normal limits. No pneumothorax is noted. Right internal jugular dialysis catheter is unchanged in position. Hypoinflation of the lungs is noted with mild bibasilar subsegmental atelectasis. Small pleural effusions may be present. Left perihilar opacity is noted concerning for atelectasis or possibly infiltrate. The visualized skeletal structures are unremarkable. IMPRESSION: Hypoinflation of the lungs is noted with mild bibasilar subsegmental atelectasis. Left perihilar opacity is noted concerning for atelectasis or possibly infiltrate. Electronically Signed   By: Marijo Conception M.D.   On: 05/05/2020 08:28   DG Chest Port 1 View  Result Date: 04/28/2020 CLINICAL DATA:  Sepsis. EXAM: PORTABLE CHEST 1 VIEW COMPARISON:  April 14, 2020. FINDINGS: The heart size and mediastinal contours are within normal limits. Both lungs are clear. No pneumothorax or pleural effusion is noted. Interval placement of right internal jugular catheter with distal tip in expected position of the SVC. The visualized skeletal structures are unremarkable. IMPRESSION: No active disease. Electronically Signed   By: Marijo Conception M.D.   On: 04/28/2020 13:08   CT IMAGE GUIDED DRAINAGE BY PERCUTANEOUS CATHETER  Result Date: 05/16/2020 INDICATION: 59 year old female with history of diverticulitis complicated by intra-abdominal abscess with prior lower abdominal drain placement, now malpositioned on recent CT abdomen pelvis. Additionally there are new intra-abdominal fluid collections in the upper abdomen, largest in the left mid abdomen. EXAM: CT GUIDED aspiration OF abdominal fluid collection CT-guided exchange of indwelling abscess drain MEDICATIONS: The patient is currently admitted to the hospital and receiving intravenous antibiotics. The antibiotics were administered within an appropriate time frame prior to the initiation of the procedure. ANESTHESIA/SEDATION: 0 mg  IV Versed 50 mcg IV Fentanyl Moderate Sedation  Time:  0 The patient was continuously monitored during the procedure by the interventional radiology nurse under my direct supervision. COMPLICATIONS: None immediate. TECHNIQUE: Informed written consent was obtained from the patient after a thorough discussion of the procedural risks, benefits and alternatives. All questions were addressed. Maximal Sterile Barrier Technique was utilized including caps, mask, sterile gowns, sterile gloves, sterile drape, hand hygiene and skin antiseptic. A timeout was performed prior to the initiation of the procedure. PROCEDURE: The left upper quadrant and indwelling lower abdominal drain were prepped with Chlorhexidine in a sterile fashion, and a sterile drape was applied covering the operative fields. A sterile gown and sterile gloves were used for the procedure. Local anesthesia was provided with 1% Lidocaine. 73 gauge trocar needle was directed into the left abdominal fluid collection from an anterior approach. Aspiration was performed. A sample was collected and sent to lab for culture. Next, the indwelling 12 [place] lower abdominal fluid collection drain was exchanged over an Amplatz wire for a 14 [place] drain. The pigtail 4 shin was locked in the deep part of the fluid collection. The drain was sutured in place and a dressing was applied. FINDINGS: Left abdominal fluid collection yielded approximately 250 mL of serous fluid. The fluid collection was essentially absent after aspiration. Given the non purulent nature of this fluid, no drain was placed. The indwelling lower abdominal drain appears in better position than comparison CT, with the pigtail portion in the fluid collection and the radiopaque marker deep to the rectus she. There was succus draining from around the catheter entry site. Successful exchange with 14 French drain, now position deeper within the fluid collection. IMPRESSION: 1. Left upper abdominal fluid collection yielded serous fluid, therefore no drain  was placed. Recommend follow-up fluid culture. 2. Successful upsize and repositioning of the indwelling lower abdominal drain from 12 [place] to 14 [place]. Given feculent output around the drain and within the drain bulb upon completion of the procedure, it is suspected that the draining fluid collection represents perforation of a diverticulum in the setting of diverticulitis. Electronically Signed   By: Ruthann Cancer MD   On: 05/16/2020 07:49    (Echo, Carotid, EGD, Colonoscopy, ERCP)    Subjective: Seen and examined the day of discharge.  Initially insistent upon returning back home however after repeated discussions with multiple providers as well as TOC patient is agreeable for skilled nursing facility placement.  She is stable for skilled nursing facility placement at this time.  Discharge Exam: Vitals:   05/23/20 1244 05/23/20 1325  BP: (!) 104/48 (!) 125/53  Pulse:    Resp: 16 16  Temp:    SpO2: 99% 99%   Vitals:   05/22/20 2224 05/23/20 0425 05/23/20 1244 05/23/20 1325  BP: (!) 109/50 112/67 (!) 104/48 (!) 125/53  Pulse: 78 82    Resp:  20 16 16   Temp: 97.7 F (36.5 C) 97.7 F (36.5 C)    TempSrc: Oral Oral    SpO2: 100% 100% 99% 99%  Weight:      Height:        General: No acute distress Cardiovascular: RRR, S1/S2 +, no rubs, no gallops Respiratory: Poor respiratory effort.  Lung sounds decreased at bases.  Clear to auscultation.  2 L Abdominal: Obese, nontender, nondistended, positive bowel sounds, right-sided abdominal drain Extremities: no edema, no cyanosis    The results of significant diagnostics from this hospitalization (including imaging, microbiology, ancillary and laboratory)  are listed below for reference.     Microbiology: Recent Results (from the past 240 hour(s))  Aerobic/Anaerobic Culture (surgical/deep wound)     Status: None   Collection Time: 05/15/20  4:36 PM   Specimen: Abdomen; Abdominal Fluid  Result Value Ref Range Status   Specimen  Description   Final    ABDOMEN Performed at [facility], [address], [place], [place] [address]    Special Requests   Final    ABDOMINAL FLUID Performed at [facility], [address] [place], [place] [address]    Gram Stain   Final    RARE WBC PRESENT, PREDOMINANTLY MONONUCLEAR NO ORGANISMS SEEN    Culture   Final    No growth aerobically or anaerobically. Performed at [facility], [place] [address], [place], [place] [address]    Report Status 05/20/2020 FINAL  Final  Body fluid culture     Status: None   Collection Time: 05/16/20  6:37 PM   Specimen: JP Drain; Body Fluid  Result Value Ref Range Status   Specimen Description   Final    JP DRAINAGE Performed at [facility], [address] [place], [place] [address]    Special Requests   Final    NONE Performed at [facility], [place], [place] [address]    Gram Stain   Final    FEW WBC PRESENT,BOTH PMN AND MONONUCLEAR ABUNDANT GRAM POSITIVE COCCI MODERATE GRAM NEGATIVE RODS Performed at [facility], [place] [address] [place], [place] [address]    Culture   Final    ABUNDANT PSEUDOMONAS AERUGINOSA ABUNDANT ENTEROCOCCUS SPECIES    Report Status 05/20/2020 FINAL  Final   Organism ID, Bacteria PSEUDOMONAS AERUGINOSA  Final   Organism ID, Bacteria ENTEROCOCCUS SPECIES  Final      Susceptibility   Enterococcus species - MIC*    AMPICILLIN 16 RESISTANT Resistant     VANCOMYCIN 2 SENSITIVE Sensitive     GENTAMICIN SYNERGY SENSITIVE Sensitive     * ABUNDANT ENTEROCOCCUS SPECIES   Pseudomonas aeruginosa - MIC*    CEFTAZIDIME 4 SENSITIVE Sensitive     CIPROFLOXACIN 1 SENSITIVE Sensitive     GENTAMICIN 8 INTERMEDIATE Intermediate     IMIPENEM >=16 RESISTANT Resistant     PIP/TAZO 8 SENSITIVE Sensitive     * ABUNDANT PSEUDOMONAS AERUGINOSA  Fungus Culture With Stain     Status: Abnormal (Preliminary result)   Collection Time: 05/16/20  6:37 PM    Specimen: JP Drain  Result Value Ref Range Status   Fungus Stain Final report (A)  Final    Comment: (NOTE) Performed At: [facility] [address] [place], [place] [id number] Rush Farmer MD [id number]    Fungus (Mycology) Culture PENDING  Incomplete   Fungal Source JP DRAINAGE  Final    Comment: Performed at [facility], [address] [place], [place] [address]  Fungus Culture Result     Status: Abnormal   Collection Time: 05/16/20  6:37 PM  Result Value Ref Range Status   Result 1 Yeast observed (A)  Final    Comment: (NOTE) Performed At: [facility] [address] [place], [place] [id number] Rush Farmer MD [id number]      Labs: BNP (last 3 results) Recent Labs    11/04/19 0821  BNP 75.8   Basic Metabolic Panel: Recent Labs  Lab 05/19/20 0653 05/20/20 0514 05/21/20 0613 05/22/20 0415 05/23/20 0440  NA 138 139  140 139 143  K 3.3* 3.6 3.3* 3.5 3.2*  CL 101 102 101 102 104  CO2 28 29 28 27 28   GLUCOSE 84 76 62* 74 69*  BUN 25* 25* 25* 24* 21*  CREATININE 1.97* 1.87* 1.89* 1.84* 1.67*  CALCIUM 7.2* 7.2* 7.0* 6.8* 6.9*  MG  --   --   --  1.6* 1.6*   Liver Function Tests: No results for input(s): AST, ALT, ALKPHOS, BILITOT, PROT, ALBUMIN in the last 168 hours. No results for input(s): LIPASE, AMYLASE in the last 168 hours. No results for input(s): AMMONIA in the last 168 hours. CBC: Recent Labs  Lab 05/19/20 0653 05/19/20 0653 05/20/20 0514 05/21/20 0613 05/22/20 0415 05/23/20 0006 05/23/20 0440  WBC 27.6*  --  22.9* 19.0* 15.0*  --  11.8*  NEUTROABS 19.9*  --  16.4* 14.1* 10.7*  --  7.9*  HGB 7.6*   < > 7.3* 7.8* 7.0* 8.5* 8.9*  HCT 23.0*   < > 22.8* 23.0* 22.1* 26.9* 27.4*  MCV 89.8  --  94.6 91.3 92.5  --  91.9  PLT 241  --  264 295 286  --  296   < > = values in this interval not displayed.   Cardiac Enzymes: No results for input(s): CKTOTAL, CKMB, CKMBINDEX, TROPONINI in the last 168  hours. BNP: Invalid input(s): POCBNP CBG: No results for input(s): GLUCAP in the last 168 hours. D-Dimer No results for input(s): DDIMER in the last 72 hours. Hgb A1c No results for input(s): HGBA1C in the last 72 hours. Lipid Profile No results for input(s): CHOL, HDL, LDLCALC, TRIG, CHOLHDL, LDLDIRECT in the last 72 hours. Thyroid function studies No results for input(s): TSH, T4TOTAL, T3FREE, THYROIDAB in the last 72 hours.  Invalid input(s): FREET3 Anemia work up No results for input(s): VITAMINB12, FOLATE, FERRITIN, TIBC, IRON, RETICCTPCT in the last 72 hours. Urinalysis    Component Value Date/Time   COLORURINE YELLOW (A) 04/15/2020 0428   APPEARANCEUR HAZY (A) 04/15/2020 0428   APPEARANCEUR Hazy 05/19/2014 1633   LABSPEC >1.046 (H) 04/15/2020 0428   LABSPEC 1.020 05/19/2014 1633   PHURINE 5.0 04/15/2020 0428   GLUCOSEU NEGATIVE 04/15/2020 0428   GLUCOSEU Negative 05/19/2014 1633   HGBUR NEGATIVE 04/15/2020 0428   BILIRUBINUR NEGATIVE 04/15/2020 0428   BILIRUBINUR Negative 05/19/2014 1633   KETONESUR NEGATIVE 04/15/2020 0428   PROTEINUR NEGATIVE 04/15/2020 0428   NITRITE NEGATIVE 04/15/2020 0428   LEUKOCYTESUR NEGATIVE 04/15/2020 0428   LEUKOCYTESUR Negative 05/19/2014 1633   Sepsis Labs Invalid input(s): PROCALCITONIN,  WBC,  LACTICIDVEN Microbiology Recent Results (from the past 240 hour(s))  Aerobic/Anaerobic Culture (surgical/deep wound)     Status: None   Collection Time: 05/15/20  4:36 PM   Specimen: Abdomen; Abdominal Fluid  Result Value Ref Range Status   Specimen Description   Final    ABDOMEN Performed at [facility], [address] [place], [place] [address]    Special Requests   Final    ABDOMINAL FLUID Performed at [facility], [address] [place], [place] [address]    Gram Stain   Final    RARE WBC PRESENT, PREDOMINANTLY MONONUCLEAR NO ORGANISMS SEEN    Culture   Final    No growth aerobically or  anaerobically. Performed at [facility], [place] [address] [place], [place] [address]    Report Status 05/20/2020 FINAL  Final  Body fluid culture     Status: None   Collection Time: 05/16/20  6:37 PM   Specimen: JP Drain; Body Fluid  Result Value Ref Range Status   Specimen Description   Final    JP DRAINAGE Performed at [facility], [address] [place], [place] [address]    Special Requests   Final    NONE Performed at [facility], [place], [place] [address]    Gram Stain   Final    FEW WBC PRESENT,BOTH PMN AND MONONUCLEAR ABUNDANT GRAM POSITIVE COCCI MODERATE GRAM NEGATIVE RODS Performed at [facility], [place] [address], [place], [place] [address]    Culture   Final    ABUNDANT PSEUDOMONAS AERUGINOSA ABUNDANT ENTEROCOCCUS SPECIES    Report Status 05/20/2020 FINAL  Final   Organism ID, Bacteria PSEUDOMONAS AERUGINOSA  Final   Organism ID, Bacteria ENTEROCOCCUS SPECIES  Final      Susceptibility   Enterococcus species - MIC*    AMPICILLIN 16 RESISTANT Resistant     VANCOMYCIN 2 SENSITIVE Sensitive     GENTAMICIN SYNERGY SENSITIVE Sensitive     * ABUNDANT ENTEROCOCCUS SPECIES   Pseudomonas aeruginosa - MIC*    CEFTAZIDIME 4 SENSITIVE Sensitive     CIPROFLOXACIN 1 SENSITIVE Sensitive     GENTAMICIN 8 INTERMEDIATE Intermediate     IMIPENEM >=16 RESISTANT Resistant     PIP/TAZO 8 SENSITIVE Sensitive     * ABUNDANT PSEUDOMONAS AERUGINOSA  Fungus Culture With Stain     Status: Abnormal (Preliminary result)   Collection Time: 05/16/20  6:37 PM   Specimen: JP Drain  Result Value Ref Range Status   Fungus Stain Final report (A)  Final    Comment: (NOTE) Performed At: [facility] [address] [place], [place] [id number] Rush Farmer MD [id number]    Fungus (Mycology) Culture PENDING  Incomplete   Fungal Source JP DRAINAGE  Final    Comment: Performed at [facility], [address]  [place], [place] [address]  Fungus Culture Result     Status: Abnormal   Collection Time: 05/16/20  6:37 PM  Result Value Ref Range Status   Result 1 Yeast observed (A)  Final    Comment: (NOTE) Performed At: [facility] [place], [place] [id number] Rush Farmer MD [id number]      Time coordinating discharge: Over 30 minutes  SIGNED:   Sidney Ace, MD  [facility] 05/23/2020, 4:04 PM Pager   If 7PM-7AM, please contact night-coverage

## 2020-05-23 NOTE — Progress Notes (Signed)
Physical Therapy Treatment Patient Details Name: Melanie Bowen MRN: [id number] DOB: 25-Sep-1960 Today's Date: 05/23/2020    History of Present Illness  59 yo F who comes to [facility] on 8/16 c LLQ ABD pain. Pt admitted c Acute sigmoid diverticulitis with diverticular abscess. PMH: COPD, asthma, HTN, diverticulosis, HIV, GERD, depression, bilat TKA, 3 level lumbar fusion, susequent lumbar hardware removal. Pt moved to ICU early in stay, inititated on HD on 04/16/20, temp cath removed 8/25. Pt  has had AF RVR issues. Pt also reports a subacute Rt rotator cuff injury.  Had procedure on 05/06/20 for increasing drain tube size, to increase removal of abd drainage    PT Comments    Pt reported being in a bad mood that she was "ready to be anywhere but here". She readily agreed to therapy but asked to be cleaned up as she had just had a BM. Nursing came in and the pt performed rolling with Max +2 assist. Pt was agitated during cleaning and linen changing as the pt's sacral wound reported hurt. Afterwards pt came from supine to sit with [facility] listed. Pt able to perform with Max +2 assist and increased time as the pt requested for PT to go slow to help moderate dizziness. Pt able able to come fully to sitting but was very dizzy. After 1 minute, pt became nauseous and needed to lay back down. Pt educated on the physiology of orthostasis. Pt will benefit from PT services in a SNF setting upon discharge to safely address deficits listed in patient problem list for decreased caregiver assistance and eventual return to PLOF.   Follow Up Recommendations  SNF     Equipment Recommendations  None recommended by PT    Recommendations for Other Services       Precautions / Restrictions Precautions Precautions: Fall Precaution Comments: percutaneous drainage tube abd Restrictions Other Position/Activity Restrictions: abd drain    Mobility  Bed Mobility Overal bed mobility: Independent Bed Mobility:  Rolling;Sidelying to Sit;Supine to Sit Rolling: Max assist;+2 for physical assistance Sidelying to sit: Max assist;+2 for physical assistance Supine to sit: Max assist;+2 for physical assistance Sit to supine: Max assist;+2 for physical assistance   General bed mobility comments: +2 able to come to sitting but unable to maintain because of dizziness and nausea  Transfers                 General transfer comment: unable to attempt  Ambulation/Gait             General Gait Details: Unable to attempt   Stairs             Wheelchair Mobility    Modified Rankin (Stroke Patients Only)       Balance Overall balance assessment: Independent Sitting-balance support: Feet unsupported;Bilateral upper extremity supported Sitting balance-Leahy Scale: Zero Sitting balance - Comments: pt unable to keep balance secondary to dizziness and nasuea Postural control: Posterior lean     Standing balance comment: unable to attempt                            Cognition Arousal/Alertness: Awake/alert Behavior During Therapy: WFL for tasks assessed/performed Overall Cognitive Status: Within Functional Limits for tasks assessed                                 General Comments: pt became somwhat agitated likely due  to pain during linen change and cleaning      Exercises Total Joint Exercises Ankle Circles/Pumps: AROM;10 reps;Both Other Exercises Other Exercises: pt sat EOB for 1 minutes for tolerence to sitting but needed to lie back down due to dizziness Other Exercises: pt educated on physiology or orthostasis    General Comments        Pertinent Vitals/Pain Faces Pain Scale: Hurts little more Pain Location: pt reports no pain at rest but displays a fair amount of pain during linen changing Pain Descriptors / Indicators: Moaning;Burning Pain Intervention(s): Limited activity within patient's tolerance;Monitored during session;Repositioned     Home Living                      Prior Function            PT Goals (current goals can now be found in the care plan section) Acute Rehab PT Goals Patient Stated Goal: to get stronger and go home PT Goal Formulation: With patient Time For Goal Achievement: 05/31/20 Potential to Achieve Goals: Fair Progress towards PT goals: Progressing toward goals    Frequency    Min 2X/week      PT Plan Current plan remains appropriate    Co-evaluation              AM-PAC PT "6 Clicks" Mobility   Outcome Measure  Help needed turning from your back to your side while in a flat bed without using bedrails?: A Lot Help needed moving from lying on your back to sitting on the side of a flat bed without using bedrails?: Total Help needed moving to and from a bed to a chair (including a wheelchair)?: Total Help needed standing up from a chair using your arms (e.g., wheelchair or bedside chair)?: Total Help needed to walk in hospital room?: Total Help needed climbing 3-5 steps with a railing? : Total 6 Click Score: 7    End of Session Equipment Utilized During Treatment: Gait belt;Oxygen Activity Tolerance: Patient limited by pain;Other (comment) ([place]) Patient left: in bed;with call bell/phone within reach; all bed rails left up as pt was found Nurse Communication: Mobility status PT Visit Diagnosis: Unsteadiness on feet (R26.81);Muscle weakness (generalized) (M62.81);Difficulty in walking, not elsewhere classified (R26.2)     Time: 2563-8937 PT Time Calculation (min) (ACUTE ONLY): 40 min  Charges:                        Hervey Ard, SPT 05/23/20. 1:10 PM

## 2020-05-23 NOTE — TOC Transition Note (Signed)
Transition of Care [facility]) - CM/SW Discharge Note   Patient Details  Name: Melanie Bowen MRN: [id number] Date of Birth: 1961-07-29  Transition of Care [facility]) CM/SW Contact:  Beverly Sessions, RN Phone Number: 05/23/2020, 5:25 PM   Clinical Narrative:     Patient to discharge today Bed offers presented to patient, daughters and sister.  Accepted [facility] [place] .   Auth submitted through portal Bedside RN to call report EMS transport called EMS packet printed, MD has signed prescriptions for controlled substances     Final next level of care: Skilled Nursing Facility Barriers to Discharge: No Barriers Identified   Patient Goals and CMS Choice        Discharge Placement                Patient to be transferred to facility by: [facility] Name of family member notified: daughters and sisters Patient and family notified of of transfer: 05/23/20  Discharge Plan and Services                                     Social Determinants of Health (SDOH) Interventions     Readmission Risk Interventions Readmission Risk Prevention Plan 05/23/2020 11/08/2019  Transportation Screening Complete Complete  Medication Review Press photographer) Complete Complete  PCP or Specialist appointment within 3-5 days of discharge (No Data) Not Complete  PCP/Specialist Appt Not Complete comments - Her PCP is out for 2-3 weeks so other two providers are booked out. The receptionist will call her if there is a cancellation.  [place] or Home Care Consult (No Data) Complete  SW Recovery Care/Counseling Consult Complete Complete  Palliative Care Screening Complete Not Applicable  Skilled Nursing Facility Complete Not Applicable  Some recent data might be hidden

## 2020-05-23 NOTE — Progress Notes (Addendum)
ID    Date of Admission:  04/14/2020    Meropenem 8/17>>9/6 9/11>>9/20 anidulafungin 8/17>>04/22/20 9/3>>9/20  Zosyn 9/20>9/22  Ceftazidime 9/22 Flagyl 9/22 Linezolid 9/22    ID: Melanie Bowen is a 59 y.o. female  Principal Problem:   Severe sepsis with septic shock ([place]) Active Problems:   HTN (hypertension)   HIV (human immunodeficiency virus infection) ([place])   Diverticulitis of large intestine with abscess without bleeding   Abscess of sigmoid colon due to diverticulitis   Acute renal failure (ARF) ([place])   Hypotension   Diverticulosis   Acute respiratory distress   DNR (do not resuscitate) discussion   Palliative care by specialist   Dyspnea   Goals of care, counseling/discussion   Pressure injury of skin    Subjective: Is adamant on going home No fever  Medications:  . sodium chloride   Intravenous Once  . amiodarone  200 mg Oral Daily  . Chlorhexidine Gluconate Cloth  6 each Topical Daily  . dextromethorphan-guaiFENesin  10 mL Oral Q8H  . rilpivirine  25 mg Oral Q breakfast   And  . dolutegravir  50 mg Oral Q breakfast  . dronabinol  5 mg Oral BID AC  . epoetin (EPOGEN/PROCRIT) injection  10,000 Units Intravenous Q M,W,F-HD  . famotidine  10 mg Oral Q1200  . feeding supplement (ENSURE ENLIVE)  237 mL Oral TID BM  . heparin injection (subcutaneous)  5,000 Units Subcutaneous Q8H  . linezolid  600 mg Oral Q12H  . metroNIDAZOLE  500 mg Oral Q8H  . mirtazapine  15 mg Oral QHS  . mometasone-formoterol  2 puff Inhalation BID  . multivitamin  1 tablet Oral QHS  . multivitamin with minerals  1 tablet Oral Daily  . potassium chloride  40 mEq Oral Once  . sodium chloride flush  5 mL Intracatheter Q8H  . torsemide  40 mg Oral Daily    Objective: Vital signs in last 24 hours: Temp:  [97.5 F (36.4 C)-97.8 F (36.6 C)] 97.7 F (36.5 C) (09/24 0425) Pulse Rate:  [78-92] 82 (09/24 0425) Resp:  [18-20] 20 (09/24 0425) BP: (106-112)/(50-67) 112/67 (09/24  0425) SpO2:  [100 %] 100 % (09/24 0425)  PHYSICAL EXAM:  General: Alert,oriented X 4, appropriate response Head: Normocephalic, without obvious abnormality, atraumatic. Eyes: Conjunctivae clear, anicteric sclerae. Pupils are equal ENT Nares normal. No drainage or sinus tenderness. Lips, mucosa, and tongue normal. No Thrush Neck: Supple, symmetrical, no adenopathy, thyroid: non tender no carotid bruit and no JVD. Back:gluteal area- macerated skin Lungs: b/l air entry Decreased bases Heart: Regular rate and rhythm, no murmur, rub or gallop. Abdomen: Soft,left lower quadrant drain-minimal  greenish fluid Extremities: skin over the palms and soles pelaing but no erythema or blisters  Skin: No rashes or lesions. Or bruising Lymph: Cervical, supraclavicular normal. Neurologic: Grossly non-focal  Lab Results Recent Labs    05/22/20 0415 05/22/20 0415 05/23/20 0006 05/23/20 0440  WBC 15.0*  --   --  11.8*  HGB 7.0*   < > 8.5* 8.9*  HCT 22.1*   < > 26.9* 27.4*  NA 139  --   --  143  K 3.5  --   --  3.2*  CL 102  --   --  104  CO2 27  --   --  28  BUN 24*  --   --  21*  CREATININE 1.84*  --   --  1.67*   < > = values in this interval not  displayed.   Microbiology:  Studies/Results: No results found.   Assessment/Plan: Septic shocksecondary to perforated diverticulitisand peridiverticular abscess.. JP drain inserted on 04/14/20. klebsiella, pseudomonas and ESBL e.coli in fluid culture-  appropriate antibiotics since 04/14/20- see above  - CT abdomen done on 9/13 shows pockets of collection andLoculated collection of extraluminal gas adjacent to the terminal Ileum. Drain upsized on 05/15/20-feculent drainage Culture enterococcus R to penicllin  & pseudomonas which is R to meropenem  currently on ceftazidime+flagyl+linezolid  Leucocytosis much improved  has  adequate source control with drain. Repeat CT abdomen  will DC antibiotics early next week- arund 9/30 Recommend  a repeat CT abdomen If patient wants to go home and is discharged then can give Ciprofloxacin , flagyl and linezolid for  6 days.. With precautions and foods/SSRI and other meds  to avoid while on lineolid.    Encephalopathy-metabolic -much improved  Hypoxic resp failureon presentation-needed intubation for a few days s/p extubation Rigorousincentive spirometry Chest PT because of secretions and atelectasis   AKI due to septic shock/contrast nephropathy-improving was ondialysis which is on hold,   Anasarca- better  HIV- well controlled in jan 2021 her Vl <20 and cd4 >1000.  On 05/03/20 Vl is < 20 Cd4301 ( 50%)On dolutegravir and rilpivirine -instead of Genvoya because of AKI and once the AKI resolves completely she could go back on genvoya if she wants to - other wise continue with current regimen- Pt is in favor of current regimen and she will follow with her provider at [facility]   Afib on amiodarone  Needs PT Discussed the management withpatient and her daughter in great detail  Discussed with hospitalist and pharmacist Pt need not follow up with me as OP- She will follow up with surgery and if they think she needs ID to see her they can refer her to me

## 2020-05-23 NOTE — Care Management Important Message (Signed)
Important Message  Patient Details  Name: Melanie Bowen MRN: [id number] Date of Birth: 1961/01/12   Medicare Important Message Given:  Yes  Reviewed with patient via room phone due to isolation status.  Copy of Medicare IM delivered to patient's daughter outside of room.   Dannette Barbara 05/23/2020, 3:42 PM

## 2020-05-26 ENCOUNTER — Encounter: Payer: Self-pay | Admitting: Vascular Surgery

## 2020-06-02 ENCOUNTER — Emergency Department (HOSPITAL_COMMUNITY): Payer: Medicare Other

## 2020-06-02 ENCOUNTER — Encounter (HOSPITAL_COMMUNITY): Payer: Self-pay | Admitting: Emergency Medicine

## 2020-06-02 ENCOUNTER — Other Ambulatory Visit: Payer: Self-pay

## 2020-06-02 ENCOUNTER — Emergency Department (HOSPITAL_COMMUNITY)
Admission: EM | Admit: 2020-06-02 | Discharge: 2020-06-03 | Disposition: A | Discharge status: Home / Self Care | Payer: Medicare Other | Attending: Emergency Medicine | Admitting: Emergency Medicine

## 2020-06-02 DIAGNOSIS — S42291A Other displaced fracture of upper end of right humerus, initial encounter for closed fracture: Secondary | ICD-10-CM

## 2020-06-02 DIAGNOSIS — I10 Essential (primary) hypertension: Secondary | ICD-10-CM | POA: Diagnosis not present

## 2020-06-02 DIAGNOSIS — W06XXXA Fall from bed, initial encounter: Secondary | ICD-10-CM | POA: Insufficient documentation

## 2020-06-02 DIAGNOSIS — F17210 Nicotine dependence, cigarettes, uncomplicated: Secondary | ICD-10-CM | POA: Diagnosis not present

## 2020-06-02 DIAGNOSIS — J45909 Unspecified asthma, uncomplicated: Secondary | ICD-10-CM | POA: Insufficient documentation

## 2020-06-02 DIAGNOSIS — J449 Chronic obstructive pulmonary disease, unspecified: Secondary | ICD-10-CM | POA: Insufficient documentation

## 2020-06-02 DIAGNOSIS — S42301A Unspecified fracture of shaft of humerus, right arm, initial encounter for closed fracture: Secondary | ICD-10-CM | POA: Diagnosis not present

## 2020-06-02 DIAGNOSIS — Z79899 Other long term (current) drug therapy: Secondary | ICD-10-CM | POA: Insufficient documentation

## 2020-06-02 DIAGNOSIS — S42141A Displaced fracture of glenoid cavity of scapula, right shoulder, initial encounter for closed fracture: Secondary | ICD-10-CM | POA: Insufficient documentation

## 2020-06-02 DIAGNOSIS — L89153 Pressure ulcer of sacral region, stage 3: Secondary | ICD-10-CM | POA: Insufficient documentation

## 2020-06-02 DIAGNOSIS — S4991XA Unspecified injury of right shoulder and upper arm, initial encounter: Secondary | ICD-10-CM | POA: Diagnosis present

## 2020-06-02 MED ORDER — OXYCODONE-ACETAMINOPHEN 5-325 MG PO TABS
1.0000 | ORAL_TABLET | Freq: Once | ORAL | Status: AC
  Administered 2020-06-02: 1 via ORAL
  Filled 2020-06-02: qty 1

## 2020-06-02 NOTE — ED Provider Notes (Signed)
[facility] EMERGENCY DEPARTMENT Provider Note   CSN: [id number] Arrival date & time: 06/02/20  2133     History Chief Complaint  Patient presents with  . Arm Pain    Melanie Bowen is a 59 y.o. female.  HPI     This is a 59 year old female with a history of hypertension, COPD, HIV, recent history of septic shock secondary to diverticulitis who presents following a fall.  Patient presents from the [facility] in [place].  She reports that she called nursing to help change her.  However, she rolled her and rolled off her bed.  She denies hitting her head or loss of consciousness.  She hit her right shoulder and her lower back.  She is complaining mostly of pain in the right shoulder.  She rates pain 10 out of 10.  No numbness or tingling noted.  She is right-handed.  She is not currently on any blood thinners.  Of note, patient reports that she is dissatisfied with the rehab facility she is in.  She reports that she has increasing sacral wounds that which she wishes that I look at.  Past Medical History:  Diagnosis Date  . Acute GI hemorrhage 12/2015   required transfusion  . Allergic rhinitis   . Arthritis    DDD  . Asthma   . Blepharitis   . Bronchitis   . Cigarette smoker   . Claustrophobia   . COPD (chronic obstructive pulmonary disease) ([place])   . Cough   . DDD (degenerative disc disease), lumbar 04/10/2014  . Depression   . Diverticulosis   . Ectopic pregnancy   . Eye irritation   . Genital herpes   . GERD (gastroesophageal reflux disease)   . Headache   . HIV (human immunodeficiency virus infection) ([place])   . Hypertension   . Paresthesia of hand, bilateral   . Shortness of breath dyspnea    at times due to asthma    Patient Active Problem List   Diagnosis Date Noted  . Pressure injury of skin 05/08/2020  . Goals of care, counseling/discussion   . Dyspnea   . Acute renal failure (ARF) ([place]) 04/16/2020  . Severe sepsis with septic shock ([place])  04/16/2020  . Hypotension 04/16/2020  . Diverticulosis 04/16/2020  . Acute respiratory distress   . DNR (do not resuscitate) discussion   . Palliative care by specialist   . Abscess of sigmoid colon due to diverticulitis 04/14/2020  . Multifocal pneumonia 11/06/2019  . AKI (acute kidney injury) ([place]) 11/06/2019  . Leukocytosis 11/06/2019  . Diverticulitis of large intestine with abscess without bleeding 01/17/2019  . Morbid (severe) obesity due to excess calories ([place]) 11/26/2018  . Violation of controlled substance agreement 12/05/2017  . Positive urine drug screen (+cocaine) 12/05/2017  . Cervicalgia 12/05/2017  . Drug-seeking behavior 12/05/2017  . Community acquired pneumonia 11/28/2016  . Pneumonia 11/28/2016  . Allergic rhinitis 05/27/2016  . Tobacco abuse counseling 02/11/2016  . Symptomatic anemia   . Lower GI bleed   . Anemia 01/16/2016  . GI bleed 01/10/2016  . GERD (gastroesophageal reflux disease) 01/10/2016  . HTN (hypertension) 01/10/2016  . Depression 01/10/2016  . HIV (human immunodeficiency virus infection) ([place]) 01/10/2016  . Lumbar pseudoarthrosis 10/31/2015  . Chronic cough   . Cough 06/19/2015  . DDD (degenerative disc disease), lumbar 12/31/2014  . Degenerative disc disease, lumbar 12/31/2014  . Asthma, chronic 11/12/2014    Past Surgical History:  Procedure Laterality Date  . BACK SURGERY  12/2014   lumbar lam. Dr. Deri Fuelling  . BACK SURGERY  12/25/2012   Removal lumbosubarachnoid shunt system, L5-S1 TF ESI, Dr. Virl Axe Minchew  . back surgery may 2016     lumbar   . BUNIONECTOMY Bilateral    feet  . COLONOSCOPY  12/06/2014   rescheduled from 11/01/2014 due to poor prep  . DIAGNOSTIC LAPAROSCOPY    . DIALYSIS/PERMA CATHETER INSERTION N/A 04/25/2020   Procedure: DIALYSIS/PERMA CATHETER INSERTION;  Surgeon: Algernon Huxley, MD;  Location: [place] [facility];  Service: Cardiovascular;  Laterality: N/A;  . DIALYSIS/PERMA CATHETER REMOVAL N/A  05/23/2020   Procedure: DIALYSIS/PERMA CATHETER REMOVAL;  Surgeon: Algernon Huxley, MD;  Location: [place] [facility];  Service: Cardiovascular;  Laterality: N/A;  . DISTAL FEMUR OSTEOTOMY Right    Dr. Earnestine Leys  . ESOPHAGOGASTRODUODENOSCOPY Left 01/11/2016   Procedure: ESOPHAGOGASTRODUODENOSCOPY (EGD);  Surgeon: Hulen Luster, MD;  Location: [facility] ENDOSCOPY;  Service: Endoscopy;  Laterality: Left;  . Finger fracture Right    5th finger  . FRACTURE SURGERY Right 01/25/2014   closed reduction & percutaneous pinning of 5th digit  . HAND SURGERY Bilateral    carpal tunnel  . JOINT REPLACEMENT Bilateral    knees  . KNEE SURGERY Bilateral 2003,2007   total Joint  . LAPAROSCOPY FOR ECTOPIC PREGNANCY    . PERIPHERAL VASCULAR CATHETERIZATION N/A 01/12/2016   Procedure: Visceral Angiography;  Surgeon: Algernon Huxley, MD;  Location: [place] [facility];  Service: Cardiovascular;  Laterality: N/A;  . PERIPHERAL VASCULAR CATHETERIZATION N/A 01/12/2016   Procedure: Visceral Artery Intervention;  Surgeon: Algernon Huxley, MD;  Location: [place] [facility];  Service: Cardiovascular;  Laterality: N/A;  . REVISION TOTAL KNEE ARTHROPLASTY Right 12/31/2002   Dr. Marry Guan  . TUBAL LIGATION    . VIDEO BRONCHOSCOPY N/A 06/30/2015   Procedure: VIDEO BRONCHOSCOPY WITHOUT FLUORO;  Surgeon: Vilinda Boehringer, MD;  Location: [facility] ORS;  Service: Cardiopulmonary;  Laterality: N/A;     OB History    Gravida  4   Para      Term      Preterm      AB  1   Living  2     SAB  1   TAB      Ectopic      Multiple      Live Births              Family History  Problem Relation Age of Onset  . Breast cancer Sister     Social History   Tobacco Use  . Smoking status: Current Some Day Smoker    Packs/day: 0.25    Years: 38.00    Pack years: 9.50    Types: Cigarettes  . Smokeless tobacco: Never Used  . Tobacco comment: 1 cig daily  Vaping Use  . Vaping Use: Never used  Substance Use Topics  . Alcohol  use: Yes    Alcohol/week: 5.0 standard drinks    Types: 5 Glasses of wine per week    Comment: occ  . Drug use: Not Currently    Home Medications Prior to Admission medications   Medication Sig Start Date End Date Taking? Authorizing Provider  albuterol (VENTOLIN HFA) 108 (90 Base) MCG/ACT inhaler Inhale 2 puffs into the lungs every 6 (six) hours as needed for wheezing or shortness of breath. 05/21/19   Laban Emperor, PA-C  amiodarone (PACERONE) 200 MG tablet Take 1 tablet (200 mg total) by mouth daily.  05/24/20   Ralene Muskrat B, MD  amLODipine (NORVASC) 10 MG tablet Take 10 mg by mouth daily.     [provider]  DEXILANT 60 MG capsule Take 60 mg by mouth daily. 04/25/19   [provider]  dolutegravir (TIVICAY) 50 MG tablet Take 1 tablet (50 mg total) by mouth daily with breakfast. 05/24/20   Ralene Muskrat B, MD  famotidine (PEPCID) 20 MG tablet Take 20 mg by mouth daily.  05/08/19   [provider]  gabapentin (NEURONTIN) 600 MG tablet Take 600 mg by mouth 3 (three) times daily. 04/02/20   [provider]  HYDROcodone-acetaminophen (NORCO/VICODIN) 5-325 MG tablet Take 1 tablet by mouth every 6 (six) hours as needed. 06/03/20   Arthur Aydelotte, Barbette Hair, MD  methocarbamol (ROBAXIN) 500 MG tablet Take 500 mg by mouth in the morning, at noon, in the evening, and at bedtime. 03/31/20   [provider]  mirtazapine (REMERON) 15 MG tablet Take 1 tablet (15 mg total) by mouth at bedtime. 05/23/20   Sidney Ace, MD  rilpivirine (EDURANT) 25 MG TABS tablet Take 1 tablet (25 mg total) by mouth daily with breakfast. 05/24/20   Ralene Muskrat B, MD  torsemide (DEMADEX) 20 MG tablet Take 2 tablets (40 mg total) by mouth daily. 05/24/20   Sreenath, Sudheer B, MD  WIXELA INHUB 250-50 MCG/DOSE AEPB Inhale 1 puff into the lungs 2 (two) times daily. 11/05/19   [provider]    Allergies    Acetaminophen, Ibuprofen, Gabapentin, Morphine, Morphine and  related, and Zanaflex  [tizanidine]  Review of Systems   Review of Systems  Constitutional: Negative for fever.  Respiratory: Negative for shortness of breath.   Cardiovascular: Negative for chest pain.  Gastrointestinal: Negative for abdominal pain, nausea and vomiting.  Genitourinary: Negative for dysuria.  Musculoskeletal:       Right shoulder pain  Skin: Positive for wound.  All other systems reviewed and are negative.   Physical Exam Updated Vital Signs BP 131/61 (BP Location: Right Arm)   Pulse (!) 111   Temp 99.1 F (37.3 C) (Oral)   Resp 17   SpO2 95%   Physical Exam Vitals and nursing note reviewed.  Constitutional:      Appearance: She is well-developed.     Comments: Morbidly obese, ABCs intact, no acute distress  HENT:     Head: Normocephalic and atraumatic.     Nose: Nose normal.     Mouth/Throat:     Mouth: Mucous membranes are moist.  Eyes:     Pupils: Pupils are equal, round, and reactive to light.  Cardiovascular:     Rate and Rhythm: Normal rate and regular rhythm.     Heart sounds: Normal heart sounds.  Pulmonary:     Effort: Pulmonary effort is normal. No respiratory distress.     Breath sounds: No wheezing.  Abdominal:     General: Bowel sounds are normal.     Palpations: Abdomen is soft.     Tenderness: There is no abdominal tenderness.     Comments: JP drain left lower quadrant  Musculoskeletal:     Cervical back: Neck supple.     Right lower leg: No edema.     Left lower leg: No edema.     Comments: Tenderness to palpation proximal right humerus, limited range of motion secondary to pain, neurovascular intact distally with 2+ radial pulse  Skin:    General: Skin is warm and dry.  Comments: 5x9 cm Stage 3 pressure ulcer right buttocks, clean margins 1x2 cm Stage2/3 pressure ulcer left buttocks, clean margins  Neurological:     Mental Status: She is alert and oriented to person, place, and time.  Psychiatric:        Mood and Affect:  Mood normal.     ED Results / Procedures / Treatments   Labs (all labs ordered are listed, but only abnormal results are displayed) Labs Reviewed - No data to display  EKG None  Radiology DG Shoulder Right  Result Date: 06/02/2020 CLINICAL DATA:  Pain status post fall. EXAM: RIGHT SHOULDER - 2+ VIEW COMPARISON:  04/16/2017 FINDINGS: There is an acute, impacted fracture of the right humeral head. There is a probable displaced fracture fragment arising from the humeral head overlapping the glenoid. There is some inferior subluxation of the humeral head with a probable moderate-sized joint effusion. There is intra-articular debris. There is an acute fracture of the inferior glenoid. IMPRESSION: 1. Acute, impacted and displaced fracture of the right humeral head. 2. Probable small, mildly displaced fracture of the inferior glenoid. 3. Large joint effusion with intra-articular debris. Electronically Signed   By: Constance Holster M.D.   On: 06/02/2020 22:38    Procedures Procedures (including critical care time)  Medications Ordered in ED Medications  oxyCODONE-acetaminophen (PERCOCET/ROXICET) 5-325 MG per tablet 1 tablet (1 tablet Oral Given 06/02/20 2324)    ED Course  I have reviewed the triage vital signs and the nursing notes.  Pertinent labs & imaging results that were available during my care of the patient were reviewed by me and considered in my medical decision making (see chart for details).    MDM Rules/Calculators/A&P                           Patient reports that she is rolled out of bed waiting for someone to come and change her.  She is overall nontoxic and vital signs are reassuring.  She denies hitting her head or loss of consciousness.  She is awake, alert, oriented and able to provide history.  No external signs of trauma.  She does have tenderness over the humeral head and decreased range of motion secondary to pain.  Question fracture.  Patient expresses  frustration and displeasure with her current rehab facility.  She does have some sacral ulcers on exam but they appear clean and without infection.  X-rays show likely humeral head fracture and small glenoid fracture with debris intra-articularly.  Will place in a sling and have her follow-up with orthopedics.  She be discharged with short course of pain medication.  I have encouraged her to discuss her ongoing rehab needs with the social worker at her facility.  After history, exam, and medical workup I feel the patient has been appropriately medically screened and is safe for discharge home. Pertinent diagnoses were discussed with the patient. Patient was given return precautions.   Final Clinical Impression(s) / ED Diagnoses Final diagnoses:  Humeral head fracture, right, closed, initial encounter  Closed fracture of glenoid cavity and neck of right scapula, initial encounter  Pressure injury of sacral region, stage 3 ([place])    Rx / DC Orders ED Discharge Orders         Ordered    HYDROcodone-acetaminophen (NORCO/VICODIN) 5-325 MG tablet  Every 6 hours PRN        06/03/20 0022           Ladawn Boullion,  Barbette Hair, MD 06/03/20 [phone]

## 2020-06-02 NOTE — ED Triage Notes (Signed)
Pt from [facility]. Pt sent due to fall from bed. Pt reports right shoulder pain.

## 2020-06-03 MED ORDER — HYDROCODONE-ACETAMINOPHEN 5-325 MG PO TABS
1.0000 | ORAL_TABLET | Freq: Four times a day (QID) | ORAL | 0 refills | Status: DC | PRN
Start: 2020-06-03 — End: 2020-06-12

## 2020-06-03 NOTE — Discharge Instructions (Signed)
You were seen today and found to have a fracture of your humeral head and a small fracture of your glenoid.  Keep shoulder immobilized with a sling and follow-up with orthopedics for ongoing management.

## 2020-06-03 NOTE — ED Notes (Signed)
Pt in bed, turned pt, pt has stage 2 pressure ulces on buttocks 5 by 9cm on R buttock and other small ones on the L, dressing placed, cleaned pt for small stool.

## 2020-06-03 NOTE — ED Notes (Signed)
Ems at bedside to take [facility], report to ems.

## 2020-06-03 NOTE — ED Notes (Signed)
Pt in bed with eyes closed, resps even and unlabored.  

## 2020-06-05 ENCOUNTER — Other Ambulatory Visit: Payer: Self-pay

## 2020-06-05 ENCOUNTER — Observation Stay
Admission: EM | Admit: 2020-06-05 | Discharge: 2020-06-09 | Disposition: A | Discharge status: Home / Self Care | Payer: Medicare Other | Attending: Internal Medicine | Admitting: Internal Medicine

## 2020-06-05 ENCOUNTER — Ambulatory Visit: Payer: Medicare Other | Admitting: Orthopedic Surgery

## 2020-06-05 DIAGNOSIS — F17210 Nicotine dependence, cigarettes, uncomplicated: Secondary | ICD-10-CM | POA: Insufficient documentation

## 2020-06-05 DIAGNOSIS — Z20822 Contact with and (suspected) exposure to covid-19: Secondary | ICD-10-CM | POA: Insufficient documentation

## 2020-06-05 DIAGNOSIS — J449 Chronic obstructive pulmonary disease, unspecified: Secondary | ICD-10-CM | POA: Insufficient documentation

## 2020-06-05 DIAGNOSIS — Z6841 Body Mass Index (BMI) 40.0 and over, adult: Secondary | ICD-10-CM | POA: Diagnosis not present

## 2020-06-05 DIAGNOSIS — R509 Fever, unspecified: Principal | ICD-10-CM | POA: Insufficient documentation

## 2020-06-05 DIAGNOSIS — Z96653 Presence of artificial knee joint, bilateral: Secondary | ICD-10-CM | POA: Insufficient documentation

## 2020-06-05 DIAGNOSIS — I4891 Unspecified atrial fibrillation: Secondary | ICD-10-CM | POA: Insufficient documentation

## 2020-06-05 DIAGNOSIS — D649 Anemia, unspecified: Secondary | ICD-10-CM | POA: Diagnosis not present

## 2020-06-05 DIAGNOSIS — I10 Essential (primary) hypertension: Secondary | ICD-10-CM | POA: Diagnosis not present

## 2020-06-05 DIAGNOSIS — K651 Peritoneal abscess: Secondary | ICD-10-CM

## 2020-06-05 DIAGNOSIS — E876 Hypokalemia: Secondary | ICD-10-CM

## 2020-06-05 DIAGNOSIS — Z79899 Other long term (current) drug therapy: Secondary | ICD-10-CM | POA: Insufficient documentation

## 2020-06-05 DIAGNOSIS — B20 Human immunodeficiency virus [HIV] disease: Secondary | ICD-10-CM | POA: Diagnosis present

## 2020-06-05 LAB — COMPREHENSIVE METABOLIC PANEL
ALT: 19 U/L (ref 0–44)
AST: 27 U/L (ref 15–41)
Albumin: 1.2 g/dL — ABNORMAL LOW (ref 3.5–5.0)
Alkaline Phosphatase: 96 U/L (ref 38–126)
Anion gap: 13 (ref 5–15)
BUN: 30 mg/dL — ABNORMAL HIGH (ref 6–20)
CO2: 22 mmol/L (ref 22–32)
Calcium: 7.2 mg/dL — ABNORMAL LOW (ref 8.9–10.3)
Chloride: 102 mmol/L (ref 98–111)
Creatinine, Ser: 0.92 mg/dL (ref 0.44–1.00)
GFR calc non Af Amer: >60 mL/min (ref >60)
Glucose, Bld: 63 mg/dL — ABNORMAL LOW (ref 70–99)
Potassium: 2.9 mmol/L — ABNORMAL LOW (ref 3.5–5.1)
Sodium: 137 mmol/L (ref 135–145)
Total Bilirubin: 1 mg/dL (ref 0.3–1.2)
Total Protein: 5.9 g/dL — ABNORMAL LOW (ref 6.5–8.1)

## 2020-06-05 LAB — CBC WITH DIFFERENTIAL/PLATELET
Abs Immature Granulocytes: 0.37 10*3/uL — ABNORMAL HIGH (ref 0.00–0.07)
Basophils Absolute: 0.1 10*3/uL (ref 0.0–0.1)
Basophils Relative: 1 %
Eosinophils Absolute: 1.2 10*3/uL — ABNORMAL HIGH (ref 0.0–0.5)
Eosinophils Relative: 9 %
HCT: 20.6 % — ABNORMAL LOW (ref 36.0–46.0)
Hemoglobin: 6.9 g/dL — ABNORMAL LOW (ref 12.0–15.0)
Immature Granulocytes: 3 %
Lymphocytes Relative: 15 %
Lymphs Abs: 2 10*3/uL (ref 0.7–4.0)
MCH: 29.4 pg (ref 26.0–34.0)
MCHC: 33.5 g/dL (ref 30.0–36.0)
MCV: 87.7 fL (ref 80.0–100.0)
Monocytes Absolute: 1.8 10*3/uL — ABNORMAL HIGH (ref 0.1–1.0)
Monocytes Relative: 14 %
Neutro Abs: 7.8 10*3/uL — ABNORMAL HIGH (ref 1.7–7.7)
Neutrophils Relative %: 58 %
Platelets: 243 10*3/uL (ref 150–400)
RBC: 2.35 MIL/uL — ABNORMAL LOW (ref 3.87–5.11)
RDW: 18.6 % — ABNORMAL HIGH (ref 11.5–15.5)
WBC: 13.3 10*3/uL — ABNORMAL HIGH (ref 4.0–10.5)
nRBC: 0.2 % (ref 0.0–0.2)

## 2020-06-05 LAB — RESPIRATORY PANEL BY RT PCR (FLU A&B, COVID)
Influenza A by PCR: NEGATIVE
Influenza B by PCR: NEGATIVE
SARS Coronavirus 2 by RT PCR: NEGATIVE

## 2020-06-05 LAB — PREPARE RBC (CROSSMATCH)

## 2020-06-05 LAB — GLUCOSE, CAPILLARY
Glucose-Capillary: 69 mg/dL — ABNORMAL LOW (ref 70–99)
Glucose-Capillary: 96 mg/dL (ref 70–99)

## 2020-06-05 MED ORDER — FLUTICASONE FUROATE-VILANTEROL 200-25 MCG/INH IN AEPB
1.0000 | INHALATION_SPRAY | Freq: Every day | RESPIRATORY_TRACT | Status: DC
  Administered 2020-06-06 – 2020-06-09 (×4): 1 via RESPIRATORY_TRACT
  Filled 2020-06-05: qty 28

## 2020-06-05 MED ORDER — PANTOPRAZOLE SODIUM 40 MG PO TBEC
40.0000 mg | DELAYED_RELEASE_TABLET | Freq: Every day | ORAL | Status: DC
  Administered 2020-06-05 – 2020-06-09 (×5): 40 mg via ORAL
  Filled 2020-06-05 (×6): qty 1

## 2020-06-05 MED ORDER — MIRTAZAPINE 15 MG PO TABS
15.0000 mg | ORAL_TABLET | Freq: Every day | ORAL | Status: DC
  Administered 2020-06-05 – 2020-06-08 (×4): 15 mg via ORAL
  Filled 2020-06-05 (×5): qty 1

## 2020-06-05 MED ORDER — POTASSIUM CHLORIDE CRYS ER 20 MEQ PO TBCR
40.0000 meq | EXTENDED_RELEASE_TABLET | Freq: Once | ORAL | Status: AC
  Administered 2020-06-05: 40 meq via ORAL
  Filled 2020-06-05: qty 2

## 2020-06-05 MED ORDER — FAMOTIDINE 20 MG PO TABS
20.0000 mg | ORAL_TABLET | Freq: Every day | ORAL | Status: DC
  Administered 2020-06-05 – 2020-06-08 (×4): 20 mg via ORAL
  Filled 2020-06-05 (×5): qty 1

## 2020-06-05 MED ORDER — TORSEMIDE 20 MG PO TABS
40.0000 mg | ORAL_TABLET | Freq: Every day | ORAL | Status: DC
  Administered 2020-06-06 – 2020-06-09 (×4): 40 mg via ORAL
  Filled 2020-06-05 (×4): qty 2

## 2020-06-05 MED ORDER — HYDROCODONE-ACETAMINOPHEN 5-325 MG PO TABS
1.0000 | ORAL_TABLET | Freq: Four times a day (QID) | ORAL | Status: DC | PRN
  Administered 2020-06-07 – 2020-06-08 (×2): 1 via ORAL
  Filled 2020-06-05 (×2): qty 1

## 2020-06-05 MED ORDER — SODIUM CHLORIDE 0.9% FLUSH: 3.0000 mL | INTRAVENOUS | Status: DC | PRN

## 2020-06-05 MED ORDER — SODIUM CHLORIDE 0.9% FLUSH
3.0000 mL | Freq: Two times a day (BID) | INTRAVENOUS | Status: DC
  Administered 2020-06-05 – 2020-06-09 (×7): 3 mL via INTRAVENOUS

## 2020-06-05 MED ORDER — RILPIVIRINE HCL 25 MG PO TABS
25.0000 mg | ORAL_TABLET | Freq: Every day | ORAL | Status: DC
  Administered 2020-06-06: 25 mg via ORAL
  Filled 2020-06-05: qty 1

## 2020-06-05 MED ORDER — SODIUM CHLORIDE 0.9 % IV SOLN: 10.0000 mL/h | Freq: Once | INTRAVENOUS | Status: DC

## 2020-06-05 MED ORDER — AMLODIPINE BESYLATE 5 MG PO TABS
10.0000 mg | ORAL_TABLET | Freq: Every day | ORAL | Status: DC
  Administered 2020-06-06 – 2020-06-09 (×4): 10 mg via ORAL
  Filled 2020-06-05 (×5): qty 2

## 2020-06-05 MED ORDER — DOLUTEGRAVIR SODIUM 50 MG PO TABS
50.0000 mg | ORAL_TABLET | Freq: Every day | ORAL | Status: DC
  Administered 2020-06-06: 50 mg via ORAL
  Filled 2020-06-05: qty 1

## 2020-06-05 MED ORDER — GABAPENTIN 600 MG PO TABS
600.0000 mg | ORAL_TABLET | Freq: Three times a day (TID) | ORAL | Status: DC
  Administered 2020-06-05 – 2020-06-09 (×11): 600 mg via ORAL
  Filled 2020-06-05 (×12): qty 1

## 2020-06-05 MED ORDER — ALBUTEROL SULFATE HFA 108 (90 BASE) MCG/ACT IN AERS
2.0000 | INHALATION_SPRAY | Freq: Four times a day (QID) | RESPIRATORY_TRACT | Status: DC | PRN
  Filled 2020-06-05: qty 6.7

## 2020-06-05 MED ORDER — AMIODARONE HCL 200 MG PO TABS
200.0000 mg | ORAL_TABLET | Freq: Every day | ORAL | Status: DC
  Administered 2020-06-06 – 2020-06-09 (×4): 200 mg via ORAL
  Filled 2020-06-05 (×4): qty 1

## 2020-06-05 MED ORDER — METHOCARBAMOL 500 MG PO TABS
500.0000 mg | ORAL_TABLET | Freq: Four times a day (QID) | ORAL | Status: DC | PRN
  Filled 2020-06-05: qty 1

## 2020-06-05 NOTE — ED Triage Notes (Signed)
Patient here for abnormal lab result. Presented via EMS from [facility]. Pressure ulcer Stage 2 noted on bottom.

## 2020-06-05 NOTE — ED Notes (Signed)
EDP notified of pt's bg. Pt given 2 cups of juice and sandwich tray. Pt drank all of juice while this RN present.

## 2020-06-05 NOTE — ED Notes (Signed)
Patient changed brief, noted stage 2 pressure ulcers on buttocks

## 2020-06-05 NOTE — ED Notes (Signed)
EDP to bedside to speak to pt and family member

## 2020-06-05 NOTE — ED Provider Notes (Signed)
  Physical Exam  BP 135/68 (BP Location: Left Arm)   Pulse 99   Temp 98.5 F (36.9 C) (Oral)   Resp 14   Ht 5\' 6"  (1.676 m)   Wt 115.7 kg   SpO2 100%   BMI 41.16 kg/m   Physical Exam Vitals and nursing note reviewed.  Constitutional:      General: She is not in acute distress.    Appearance: She is well-developed.  HENT:     Head: Normocephalic and atraumatic.  Eyes:     Conjunctiva/sclera: Conjunctivae normal.  Cardiovascular:     Rate and Rhythm: Normal rate and regular rhythm.     Heart sounds: No murmur heard.   Pulmonary:     Effort: Pulmonary effort is normal. No respiratory distress.     Breath sounds: Normal breath sounds.  Abdominal:     Palpations: Abdomen is soft.     Tenderness: There is no abdominal tenderness.  Musculoskeletal:     Cervical back: Neck supple.  Skin:    General: Skin is warm and dry.  Neurological:     Mental Status: She is alert.     ED Course/Procedures     Procedures  MDM  Care of this patient was signed out to me at the end of the previous physician's shift.  All pertinent patient formation was conveyed and all questions were answered.  Patient was pending a CBC that showed a hemoglobin of 6.9 requiring transfusion.  Patient states that she has required multiple transfusions in the past but denies knowing what causes her anemia.  Patient's fecal occult blood test is negative for any acute GI bleeding.  Patient was transfused 1 unit and has improving symptoms.  The patient has been reexamined and is ready to be discharged.  All diagnostic results have been reviewed and discussed with the patient/family.  Care plan has been outlined and the patient/family understands all current diagnoses, results, and treatment plans.  There are no new complaints, changes, or physical findings at this time.  All questions have been addressed and answered.  All medications, if any, that were given while in the emergency department or any that are being  prescribed have been reviewed with the patient/family.  All side effects and adverse reactions have been explained.  Patient was instructed to, and agrees to follow-up with their primary care physician as well as return to the emergency department if any new or worsening symptoms develop.       Naaman Plummer, MD 06/12/20 [phone]

## 2020-06-05 NOTE — ED Provider Notes (Signed)
[facility] Emergency Department Provider Note  ____________________________________________   First MD Initiated Contact with Patient 06/05/20 1241     (approximate)  I have reviewed the triage vital signs and the nursing notes.   HISTORY  Chief Complaint Abnormal Lab   HPI Melanie Bowen is a 59 y.o. female with medical history significant for hypertension, diverticulosis, enteric infection, morbid obesity, GERD,COPD,HIV infection,history of stroke,asthma,depression, seizures recent ground-level fall at rehab status post right humeral head fracture who presents via EMS from her group facility after routine labs drawn today showed patient to be anemic.  Patient states she has chronic back pain, sacral pain, weakness, and has had significant muscle soreness in her right arm and decreased range of motion since her fall.  She states she thinks it may be a little more dizzy over the last day or so denies any acute chest pain, cough, shortness of breath, or headaches.  She denies any bleeding.  She states she is not on any blood thinners.   Past Medical History:  Diagnosis Date  . Acute GI hemorrhage 12/2015   required transfusion  . Allergic rhinitis   . Arthritis    DDD  . Asthma   . Blepharitis   . Bronchitis   . Cigarette smoker   . Claustrophobia   . COPD (chronic obstructive pulmonary disease) ([place])   . Cough   . DDD (degenerative disc disease), lumbar 04/10/2014  . Depression   . Diverticulosis   . Ectopic pregnancy   . Eye irritation   . Genital herpes   . GERD (gastroesophageal reflux disease)   . Headache   . HIV (human immunodeficiency virus infection) ([place])   . Hypertension   . Paresthesia of hand, bilateral   . Shortness of breath dyspnea    at times due to asthma    Patient Active Problem List   Diagnosis Date Noted  . Pressure injury of skin 05/08/2020  . Goals of care, counseling/discussion   . Dyspnea   . Acute renal  failure (ARF) ([place]) 04/16/2020  . Severe sepsis with septic shock ([place]) 04/16/2020  . Hypotension 04/16/2020  . Diverticulosis 04/16/2020  . Acute respiratory distress   . DNR (do not resuscitate) discussion   . Palliative care by specialist   . Abscess of sigmoid colon due to diverticulitis 04/14/2020  . Multifocal pneumonia 11/06/2019  . AKI (acute kidney injury) ([place]) 11/06/2019  . Leukocytosis 11/06/2019  . Diverticulitis of large intestine with abscess without bleeding 01/17/2019  . Morbid (severe) obesity due to excess calories ([place]) 11/26/2018  . Violation of controlled substance agreement 12/05/2017  . Positive urine drug screen (+cocaine) 12/05/2017  . Cervicalgia 12/05/2017  . Drug-seeking behavior 12/05/2017  . Community acquired pneumonia 11/28/2016  . Pneumonia 11/28/2016  . Allergic rhinitis 05/27/2016  . Tobacco abuse counseling 02/11/2016  . Symptomatic anemia   . Lower GI bleed   . Anemia 01/16/2016  . GI bleed 01/10/2016  . GERD (gastroesophageal reflux disease) 01/10/2016  . HTN (hypertension) 01/10/2016  . Depression 01/10/2016  . HIV (human immunodeficiency virus infection) ([place]) 01/10/2016  . Lumbar pseudoarthrosis 10/31/2015  . Chronic cough   . Cough 06/19/2015  . DDD (degenerative disc disease), lumbar 12/31/2014  . Degenerative disc disease, lumbar 12/31/2014  . Asthma, chronic 11/12/2014    Past Surgical History:  Procedure Laterality Date  . BACK SURGERY  12/2014   lumbar lam. Dr. Deri Fuelling  . BACK SURGERY  12/25/2012   Removal lumbosubarachnoid shunt  system, L5-S1 TF ESI, Dr. Virl Axe Minchew  . back surgery may 2016     lumbar   . BUNIONECTOMY Bilateral    feet  . COLONOSCOPY  12/06/2014   rescheduled from 11/01/2014 due to poor prep  . DIAGNOSTIC LAPAROSCOPY    . DIALYSIS/PERMA CATHETER INSERTION N/A 04/25/2020   Procedure: DIALYSIS/PERMA CATHETER INSERTION;  Surgeon: Algernon Huxley, MD;  Location: [place] [facility];  Service:  Cardiovascular;  Laterality: N/A;  . DIALYSIS/PERMA CATHETER REMOVAL N/A 05/23/2020   Procedure: DIALYSIS/PERMA CATHETER REMOVAL;  Surgeon: Algernon Huxley, MD;  Location: [place] [facility];  Service: Cardiovascular;  Laterality: N/A;  . DISTAL FEMUR OSTEOTOMY Right    Dr. Earnestine Leys  . ESOPHAGOGASTRODUODENOSCOPY Left 01/11/2016   Procedure: ESOPHAGOGASTRODUODENOSCOPY (EGD);  Surgeon: Hulen Luster, MD;  Location: [facility] ENDOSCOPY;  Service: Endoscopy;  Laterality: Left;  . Finger fracture Right    5th finger  . FRACTURE SURGERY Right 01/25/2014   closed reduction & percutaneous pinning of 5th digit  . HAND SURGERY Bilateral    carpal tunnel  . JOINT REPLACEMENT Bilateral    knees  . KNEE SURGERY Bilateral 2003,2007   total Joint  . LAPAROSCOPY FOR ECTOPIC PREGNANCY    . PERIPHERAL VASCULAR CATHETERIZATION N/A 01/12/2016   Procedure: Visceral Angiography;  Surgeon: Algernon Huxley, MD;  Location: [place] [facility];  Service: Cardiovascular;  Laterality: N/A;  . PERIPHERAL VASCULAR CATHETERIZATION N/A 01/12/2016   Procedure: Visceral Artery Intervention;  Surgeon: Algernon Huxley, MD;  Location: [place] [facility];  Service: Cardiovascular;  Laterality: N/A;  . REVISION TOTAL KNEE ARTHROPLASTY Right 12/31/2002   Dr. Marry Guan  . TUBAL LIGATION    . VIDEO BRONCHOSCOPY N/A 06/30/2015   Procedure: VIDEO BRONCHOSCOPY WITHOUT FLUORO;  Surgeon: Vilinda Boehringer, MD;  Location: [facility] ORS;  Service: Cardiopulmonary;  Laterality: N/A;    Prior to Admission medications   Medication Sig Start Date End Date Taking? Authorizing Provider  albuterol (VENTOLIN HFA) 108 (90 Base) MCG/ACT inhaler Inhale 2 puffs into the lungs every 6 (six) hours as needed for wheezing or shortness of breath. 05/21/19   Laban Emperor, PA-C  amiodarone (PACERONE) 200 MG tablet Take 1 tablet (200 mg total) by mouth daily. 05/24/20   Ralene Muskrat B, MD  amLODipine (NORVASC) 10 MG tablet Take 10 mg by mouth daily.     [provider]  DEXILANT 60 MG capsule Take 60 mg by mouth daily. 04/25/19   [provider]  dolutegravir (TIVICAY) 50 MG tablet Take 1 tablet (50 mg total) by mouth daily with breakfast. 05/24/20   Ralene Muskrat B, MD  famotidine (PEPCID) 20 MG tablet Take 20 mg by mouth daily.  05/08/19   [provider]  gabapentin (NEURONTIN) 600 MG tablet Take 600 mg by mouth 3 (three) times daily. 04/02/20   [provider]  HYDROcodone-acetaminophen (NORCO/VICODIN) 5-325 MG tablet Take 1 tablet by mouth every 6 (six) hours as needed. 06/03/20   Horton, Barbette Hair, MD  methocarbamol (ROBAXIN) 500 MG tablet Take 500 mg by mouth in the morning, at noon, in the evening, and at bedtime. 03/31/20   [provider]  mirtazapine (REMERON) 15 MG tablet Take 1 tablet (15 mg total) by mouth at bedtime. 05/23/20   Sidney Ace, MD  rilpivirine (EDURANT) 25 MG TABS tablet Take 1 tablet (25 mg total) by mouth daily with breakfast. 05/24/20   Ralene Muskrat B, MD  torsemide (DEMADEX) 20 MG tablet Take  2 tablets (40 mg total) by mouth daily. 05/24/20   Sreenath, Sudheer B, MD  WIXELA INHUB 250-50 MCG/DOSE AEPB Inhale 1 puff into the lungs 2 (two) times daily. 11/05/19   [provider]    Allergies Acetaminophen, Ibuprofen, Gabapentin, Morphine, Morphine and related, and Zanaflex  [tizanidine]  Family History  Problem Relation Age of Onset  . Breast cancer Sister     Social History Social History   Tobacco Use  . Smoking status: Current Some Day Smoker    Packs/day: 0.25    Years: 38.00    Pack years: 9.50    Types: Cigarettes  . Smokeless tobacco: Never Used  . Tobacco comment: 1 cig daily  Vaping Use  . Vaping Use: Never used  Substance Use Topics  . Alcohol use: Yes    Alcohol/week: 5.0 standard drinks    Types: 5 Glasses of wine per week    Comment: occ  . Drug use: Not Currently    Review of Systems  ROS     ____________________________________________   PHYSICAL EXAM:  VITAL SIGNS: ED Triage Vitals  Enc Vitals Group     BP      Pulse      Resp      Temp      Temp src      SpO2      Weight      Height      Head Circumference      Peak Flow      Pain Score      Pain Loc      Pain Edu?      Excl. in [place]?    Vitals:   06/05/20 1321 06/05/20 1322  BP:    Pulse:    Resp:  14  Temp:    SpO2: 100%    Physical Exam Vitals and nursing note reviewed.  Constitutional:      General: She is not in acute distress.    Appearance: She is well-developed. She is obese. She is ill-appearing.  HENT:     Head: Normocephalic and atraumatic.     Right Ear: External ear normal.     Left Ear: External ear normal.  Eyes:     Conjunctiva/sclera: Conjunctivae normal.  Cardiovascular:     Rate and Rhythm: Normal rate and regular rhythm.     Heart sounds: No murmur heard.   Pulmonary:     Effort: Pulmonary effort is normal. No respiratory distress.     Breath sounds: Normal breath sounds.  Abdominal:     Palpations: Abdomen is soft.     Tenderness: There is no abdominal tenderness.  Genitourinary:    Rectum: Guaiac result negative.  Musculoskeletal:     Right shoulder: Decreased range of motion.     Cervical back: Neck supple.  Skin:    General: Skin is warm and dry.  Neurological:     Mental Status: She is alert.     Stage II sacral decubitus ulcer.  ____________________________________________   LABS (all labs ordered are listed, but only abnormal results are displayed)  Labs Reviewed  RESPIRATORY PANEL BY RT PCR (FLU A&B, COVID)  CBC WITH DIFFERENTIAL/PLATELET  COMPREHENSIVE METABOLIC PANEL  TYPE AND SCREEN   ____________________________________________   ____________________________________________  RADIOLOGY   Official radiology report(s): No results found.  ____________________________________________   PROCEDURES  Procedure(s) performed (including  Critical Care):  Procedures   ____________________________________________   INITIAL IMPRESSION / ASSESSMENT AND PLAN / ED COURSE  Patient presents with left to history exam from her SNF with concerns for anemia on routine labs obtained earlier today.  Patient denies any recent bleeding and has guaiac negative stool.  Patient is afebrile hemodynamically stable on arrival.  Exam as above.  No obvious source of bleeding on exam or other recent injuries aside from right proximal humerus fracture.  Plan is to obtain CBC CMP and transfuse if hemoglobin is less than 7.  On review of prior records patient has had multiple hemoglobins at her slightly below 7 did require blood transfusion during recent admission.  Care of patient signed to oncoming provider at approximately 1500.  Plan is to transfuse if less than 7 and likely DC back to facility if CMP is reassuring.  ____________________________________________   FINAL CLINICAL IMPRESSION(S) / ED DIAGNOSES  Final diagnoses:  Labor abnormality    Medications - No data to display   ED Discharge Orders    None       Note:  This document was prepared using Dragon voice recognition software and may include unintentional dictation errors.   Lucrezia Starch, MD 06/05/20 1537

## 2020-06-05 NOTE — ED Notes (Signed)
bg 69

## 2020-06-05 NOTE — ED Provider Notes (Signed)
Emergency department signout note  Care of this patient was signed out to me at the end of the previous provider shift.  All pertinent patient information was conveyed and all questions were answered.  Patient was pending a CBC that showed anemia to 6.9.  Patient was written for transfusion of 1 unit of packed red blood cells.  Patient's FOBT was negative.  In further discussion with patient and patient's family, they are very upset about the conditions of her current skilled nursing facility and are refusing to return there.  Patient states that she would like to talk to social worker about possibly going to a different skilled nursing facility.  At the time of my assessment, social work has left for the day and therefore I will place a consult for patient to be evaluated the morning.  Care of this patient will be signed out to the oncoming physician at the end of my shift.  All pertinent patient information conveyed and all questions answered.  All further care and disposition decisions will be made by the oncoming physician.   Naaman Plummer, MD 06/05/20 Karl Bales

## 2020-06-05 NOTE — ED Notes (Signed)
bg 96

## 2020-06-06 DIAGNOSIS — R509 Fever, unspecified: Secondary | ICD-10-CM | POA: Diagnosis not present

## 2020-06-06 LAB — GLUCOSE, CAPILLARY
Glucose-Capillary: 76 mg/dL (ref 70–99)
Glucose-Capillary: 84 mg/dL (ref 70–99)

## 2020-06-06 LAB — BPAM RBC
Blood Product Expiration Date: 202111022359
ISSUE DATE / TIME: 202110072036
Unit Type and Rh: 5100

## 2020-06-06 LAB — TYPE AND SCREEN
ABO/RH(D): O POS
Antibody Screen: NEGATIVE
Unit division: 0

## 2020-06-06 MED ORDER — BICTEGRAVIR-EMTRICITAB-TENOFOV 50-200-25 MG PO TABS: 1.0000 | ORAL_TABLET | Freq: Every day | ORAL | 0 refills | Status: DC

## 2020-06-06 MED ORDER — BICTEGRAVIR-EMTRICITAB-TENOFOV 50-200-25 MG PO TABS
1.0000 | ORAL_TABLET | Freq: Every day | ORAL | Status: DC
  Administered 2020-06-06 – 2020-06-09 (×4): 1 via ORAL
  Filled 2020-06-06 (×4): qty 1

## 2020-06-06 NOTE — ED Notes (Signed)
Lunch menu provided to pt at this time.

## 2020-06-06 NOTE — ED Notes (Signed)
Social work at bedside.  

## 2020-06-06 NOTE — ED Notes (Signed)
Pt resting comfortably at this time. Daily meds given, pt denies any needs at this time.

## 2020-06-06 NOTE — Evaluation (Addendum)
Physical Therapy Evaluation Patient Details Name: Melanie Bowen MRN: [id number] DOB: 04-03-1961 Today's Date: 06/06/2020   History of Present Illness   Melanie Bowen is a 59 y.o. female with PMH of HTN, diverticulosis, enteric infection, morbid obesity, GERD,COPD,HIV infection,history of stroke,asthma,depression, seizures, and recent ground-level fall at rehab (10/5) status post right humeral head fracture. She presents via EMS from her group facility after routine labs drawn today showed patient to be anemic.  Patient states she has significant muscle soreness in her right arm and decreased range of motion since her fall. She also has a stage 2 sacral wound that developed at the rehab facility.    Clinical Impression  Patient is supine in bed on arrival to room and is agreeable to PT evaluation. She is maxA+2 to come sitting EOB with assistance needed for trunk and LEs, but is able to help push off of bed into sitting. Once sitting, she notes dizziness but goes away after a few minutes. She is able to participate in some exercises seated with external support for balance, but notes fatigue after several minutes. Patient is max+2 for seated to supine and once laying supine, she is able to push off with feet to help reposition her in bed. VCs utilized for proper hand placement during bed mobility. She needed assistance to put her sling on properly. She is encouraged to perform exercises throughout her day. Patient is on 1.5L throughout session and O2 sat ranged from 93-100%. Pt is emotional due to weakness and decreased function since her surgery but is encouraged on the progress she has made thus far. She is left in bed with all needs. Pt will benefit from skilled PT services and SNF upon discharge to address deficits in strength, balance, endurance, and decrease risk for future falls. This entire session was guided, instructed, and directly supervised by Janna Arch, DPT.     Follow Up  Recommendations SNF    Equipment Recommendations  None recommended by PT    Recommendations for Other Services       Precautions / Restrictions Precautions: R arm weightbearing status Precautions: Fall Precaution Comments: percutaneous drainage tube abd, NWB RUE s/p shoulder fx.  Restrictions Weight Bearing Restrictions: Yes Other Position/Activity Restrictions: RUE; abd drain      Mobility  Bed Mobility Overal bed mobility: Needs Assistance Bed Mobility: Supine to Sit;Sit to Supine     Supine to sit: Max assist;+2 for physical assistance Sit to supine: Max assist;+2 for physical assistance   General bed mobility comments: Pt is maxA+2 to come sitting EOB with assistance needed for trunk and LEs, but is able to help push off of bed into sitting. Once supine, she is able to push off with feet to help reposition her in bed.  Transfers                 General transfer comment: Did not attempt this session  Ambulation/Gait             General Gait Details: Did not attempt  Stairs            Wheelchair Mobility    Modified Rankin (Stroke Patients Only)       Balance Overall balance assessment: Needs assistance Sitting-balance support: Feet unsupported;Bilateral upper extremity supported Sitting balance-Leahy Scale: Poor Sitting balance - Comments: Pt able to keep balance with outside support. She notes dizziness but feeling goes away after a few minutes. She is able to perform exercises sitting EOB with BUE and external  support. Postural control: Posterior lean     Standing balance comment: Did not attempt this session                             Pertinent Vitals/Pain Pain Assessment: 0-10 Pain Score: 5  Pain Location: Sacrum Pain Descriptors / Indicators: Sore Pain Intervention(s): Monitored during session    Home Living Family/patient expects to be discharged to:: Private residence Living Arrangements: Alone Available Help  at Discharge: Family;Available 24 hours/day;Friend(s) Type of Home: House Home Access: Stairs to enter Entrance Stairs-Rails: Can reach both Entrance Stairs-Number of Steps: 3 Home Layout: One level Home Equipment: Walker - 2 wheels      Prior Function Level of Independence: Independent   Gait / Transfers Assistance Needed: walked with no assistance, stairs to enter house  ADL's / Homemaking Assistance Needed: I with all self care and care of home  Comments: had been working but some days family and friends helped care for her. She notes 1 fall in last 6 months which is the incident she is in the ED for.     Hand Dominance   Dominant Hand: Right    Extremity/Trunk Assessment   Upper Extremity Assessment Upper Extremity Assessment: Generalized weakness (Generally 3+ to 4/5 UE strength)    Lower Extremity Assessment Lower Extremity Assessment: Generalized weakness (Generally 4/5 LE strength)       Communication   Communication: No difficulties  Cognition Arousal/Alertness: Awake/alert Behavior During Therapy: WFL for tasks assessed/performed Overall Cognitive Status: Within Functional Limits for tasks assessed                                 General Comments: Patient is A&Ox4      General Comments      Exercises General Exercises - Lower Extremity Ankle Circles/Pumps: AROM;Both;10 reps;Seated Long Arc Quad: AROM;Seated;Right;5 reps Hip Flexion/Marching: AROM;Both;Seated;5 reps Other Exercises Other Exercises: pt sat EOB for about 5 minutes for tolerence to sitting but needed to lie back down due to fatigue   Assessment/Plan    PT Assessment Patient needs continued PT services  PT Problem List Decreased strength;Decreased range of motion;Decreased activity tolerance;Decreased balance;Decreased mobility;Decreased coordination;Decreased cognition;Decreased knowledge of use of DME;Decreased safety awareness;Cardiopulmonary status limiting  activity;Obesity;Decreased skin integrity;Pain       PT Treatment Interventions DME instruction;Gait training;Stair training;Functional mobility training;Therapeutic activities;Therapeutic exercise;Balance training;Neuromuscular re-education;Patient/family education    PT Goals (Current goals can be found in the Care Plan section)  Acute Rehab PT Goals Patient Stated Goal: to get stronger and go home PT Goal Formulation: With patient Time For Goal Achievement: 06/20/20 Potential to Achieve Goals: Fair    Frequency Min 2X/week   Barriers to discharge Inaccessible home environment;Decreased caregiver support Patient has stairs to enter and with family PRN    Co-evaluation               AM-PAC PT "6 Clicks" Mobility  Outcome Measure Help needed turning from your back to your side while in a flat bed without using bedrails?: A Lot Help needed moving from lying on your back to sitting on the side of a flat bed without using bedrails?: Total Help needed moving to and from a bed to a chair (including a wheelchair)?: Total Help needed standing up from a chair using your arms (e.g., wheelchair or bedside chair)?: Total Help needed to walk in hospital room?: Total  Help needed climbing 3-5 steps with a railing? : Total 6 Click Score: 7    End of Session Equipment Utilized During Treatment: Oxygen Activity Tolerance: Patient limited by fatigue Patient left: in bed;with call bell/phone within reach;with bed alarm set Nurse Communication: Mobility status PT Visit Diagnosis: Unsteadiness on feet (R26.81);Muscle weakness (generalized) (M62.81);Difficulty in walking, not elsewhere classified (R26.2)    Time: 8657-8469 PT Time Calculation (min) (ACUTE ONLY): 27 min   Charges:              Noemi Chapel, SPT Bernita Raisin 06/06/2020, 3:03 PM  This entire session was performed under direct supervision and direction of a licensed therapist/therapist assistant . I have personally  read, edited and approve of the note as written.  Janna Arch, PT, DPT  addend 06/09/20 due to WB status of RUE.

## 2020-06-06 NOTE — Progress Notes (Signed)
ID Pharmacy Note    Noted that Melanie Bowen's current HIV therapy of dolutegravir+ rilpivirine interacts with her proton pump inhibitor of Dexilant at home as well as her famotidine. Taking rilpivirine + proton pump inhibitors can significantly reduce absorption of rilpivirine. I discussed this interaction with Dr. Delaine Lame and we will switch Melanie Bowen to [facility] for her HIV therapy. Rayna Sexton, PharmD counseled Melanie Bowen on this change. Dr. Delaine Lame is reaching out to Melanie Bowen's primary physician for HIV to alert them of this change.    Plan Stop Dolutegravir + Rilpivirine [place], PharmD, [place], [place] [facility] Clinical Pharmacist Phone: [phone] 06/06/2020 2:47 PM

## 2020-06-06 NOTE — ED Notes (Signed)
Pt sleeping; no distress noted

## 2020-06-06 NOTE — ED Notes (Signed)
Barrier cream applied to pt

## 2020-06-06 NOTE — ED Notes (Signed)
Pt changed at this time by this RN and Set designer. Pt had small bowel movement that consisted of loose stool. Pt sheets, chux, and brief changed at this time. New purwick placed on patient. Pt slid up in bed, given blankets and is comfortable in bed. Pillow placed under bottom for comfort. This RN brought patient ice per request. No further needs noted at this time. Lights dimmed for comfort.

## 2020-06-06 NOTE — TOC Initial Note (Addendum)
Transition of Care [facility]) - Initial/Assessment Note    Patient Details  Name: Melanie Bowen MRN: [id number] Date of Birth: 1961/08/25  Transition of Care [facility]) CM/SW Contact:    Ova Freshwater Phone Number: [phone] 06/06/2020, 12:03 PM  Clinical Narrative:                  CSW spoke with patient about TOC role in patient care.  Patient was tearful and anxious when peaking about her experience [facility]. Patient stated, " I don't want to go back to that place."  Patient stated she was allowed to sit in her urine or feces and was not changds even if she called out for help.  Patient stated she wanted to go back home with her daughter Karuna Balducci [phone] and received home health services.  CSW stated we would wait on PT recommendations and then get the process for home health started.  Patient stated again that she did not want o go back to [facility] or any SNF. CSW verbalized understanding.  CSW spoke with patient's daughter Marlissa Emerick for update on patient status.  Ms. Shimko stated the patient had her own home and lived with her other daughter Nakeitha Milligan.  Ms. Amado Coe stated her mother will return home with home health and she and her sister will take turn caring for her.  Expected Discharge Plan: [place] Barriers to Discharge: Continued Medical Work up, ED Patient Insisting on an Alternate Living Situation/Facility   Patient Goals and CMS Choice Patient states their goals for this hospitalization and ongoing recovery are:: "I don't want to go back to [facility].  I don't want to go back to that place. I want to go home to my daughter's house." CMS Medicare.gov Compare Post Acute Care list provided to:: Patient Choice offered to / list presented to : Patient  Expected Discharge Plan and Services Expected Discharge Plan: [place] In-house Referral: Clinical Social Work     Living arrangements for the  past 2 months: [place] ([place])                                      Prior Living Arrangements/Services Living arrangements for the past 2 months: [place] ([place]) Lives with:: Adult Children Patient language and need for interpreter reviewed:: Yes Do you feel safe going back to the place where you live?: No   Does not feel safe going back to [facility]. Wants to go home with home health.  Need for Family Participation in Patient Care: Yes (Comment) Care giver support system in place?: Yes (comment)   Criminal Activity/Legal Involvement Pertinent to Current Situation/Hospitalization: No - Comment as needed  Activities of Daily Living      Permission Sought/Granted Permission sought to share information with : Facility Sport and exercise psychologist Shirleyann, [place] (Daughter) [phone]) Permission granted to share information with : Yes, Verbal Permission Granted  Share Information with NAME: Naseem, [place] (Daughter) [phone]           Emotional Assessment Appearance:: Appears older than stated age Attitude/Demeanor/Rapport: Apprehensive, Crying Affect (typically observed): Tearful/Crying, Overwhelmed Orientation: : Oriented to Self, Oriented to Place, Oriented to  Time, Oriented to Situation Alcohol / Substance Use: Not Applicable Psych Involvement: No (comment)  Admission diagnosis:  abnormal labs ems Patient Active Problem List   Diagnosis Date Noted  .  Pressure injury of skin 05/08/2020  . Goals of care, counseling/discussion   . Dyspnea   . Acute renal failure (ARF) ([place]) 04/16/2020  . Severe sepsis with septic shock ([place]) 04/16/2020  . Hypotension 04/16/2020  . Diverticulosis 04/16/2020  . Acute respiratory distress   . DNR (do not resuscitate) discussion   . Palliative care by specialist   . Abscess of sigmoid colon due to diverticulitis 04/14/2020  . Multifocal  pneumonia 11/06/2019  . AKI (acute kidney injury) ([place]) 11/06/2019  . Leukocytosis 11/06/2019  . Diverticulitis of large intestine with abscess without bleeding 01/17/2019  . Morbid (severe) obesity due to excess calories ([place]) 11/26/2018  . Violation of controlled substance agreement 12/05/2017  . Positive urine drug screen (+cocaine) 12/05/2017  . Cervicalgia 12/05/2017  . Drug-seeking behavior 12/05/2017  . Community acquired pneumonia 11/28/2016  . Pneumonia 11/28/2016  . Allergic rhinitis 05/27/2016  . Tobacco abuse counseling 02/11/2016  . Symptomatic anemia   . Lower GI bleed   . Anemia 01/16/2016  . GI bleed 01/10/2016  . GERD (gastroesophageal reflux disease) 01/10/2016  . HTN (hypertension) 01/10/2016  . Depression 01/10/2016  . HIV (human immunodeficiency virus infection) ([place]) 01/10/2016  . Lumbar pseudoarthrosis 10/31/2015  . Chronic cough   . Cough 06/19/2015  . DDD (degenerative disc disease), lumbar 12/31/2014  . Degenerative disc disease, lumbar 12/31/2014  . Asthma, chronic 11/12/2014   PCP:  Theotis Burrow, MD Pharmacy:   [place], [place] - [place] [address] [place] [address] Phone: [phone] Fax: [phone]  [facility] [id number] [place], [place] - [place] [address] [place] [address] Phone: [phone] Fax: [phone]     Social Determinants of Health (SDOH) Interventions    Readmission Risk Interventions Readmission Risk Prevention Plan 05/23/2020 11/08/2019  Transportation Screening Complete Complete  Medication Review (RN Care Manager) Complete Complete  PCP or Specialist appointment within 3-5 days of discharge (No Data) Not Complete  PCP/Specialist Appt Not Complete comments - Her PCP is out for 2-3 weeks so other two providers are booked out. The receptionist will call her if there is a cancellation.  [place] or Home Care  Consult (No Data) Complete  SW Recovery Care/Counseling Consult Complete Complete  Palliative Care Screening Complete Not Applicable  Skilled Nursing Facility Complete Not Applicable  Some recent data might be hidden

## 2020-06-07 DIAGNOSIS — R509 Fever, unspecified: Secondary | ICD-10-CM | POA: Diagnosis not present

## 2020-06-07 MED ORDER — ZOLPIDEM TARTRATE 5 MG PO TABS
5.0000 mg | ORAL_TABLET | ORAL | Status: AC
  Filled 2020-06-07: qty 1

## 2020-06-07 NOTE — ED Notes (Signed)
Pt given a warm blanket 

## 2020-06-07 NOTE — ED Notes (Signed)
RN Mimi and EDT Alissa cleaned up pt BM and also changed the purewick. RN Mimi placed a dressing over pressure ulcer on pt right side of gluteus. Pt fell back to sleep before leaving the room.

## 2020-06-07 NOTE — ED Notes (Signed)
VM left with SW regarding patient POC and dispo, awaiting call back. [phone] @0830 ,  [phone] @0930 

## 2020-06-07 NOTE — ED Notes (Signed)
Patient cleaned of urine and large liquid BM. Full linen change including gown and new purewick. Patient denies other needs at this time. Continues to wait for SW dispo.

## 2020-06-07 NOTE — ED Notes (Signed)
Pt sleeping. 

## 2020-06-07 NOTE — Social Work (Signed)
TOC CM/SW met with pt at bedside to discuss home health services.  Patient would like for social to call [facility]. Stated that she used [facility] services in the past.

## 2020-06-07 NOTE — ED Notes (Signed)
Patient back and forth on phone with multiple family members attempting to get someone to unlock her house and trying to arrange for someone to be with her tonight/tomorrow. After several attempts, patient reports she would prefer to stay until Monday as there is no one who can be home with her until then/until [facility] comes. She is unsafe for DC until Monday AM.

## 2020-06-07 NOTE — TOC Progression Note (Signed)
Transition of Care [facility]) - Progression Note    Patient Details  Name: Melanie Bowen MRN: [id number] Date of Birth: 04/22/1961  Transition of Care [facility]) CM/SW [place], LCSW Phone Number: 06/07/2020, 3:08 PM  Clinical Narrative:   Patient noted that she does not want to go to a SNF.  She noted that she was receiving services from [facility]   Social woker provide patient with the following information:  [employer] - [phone] [place], [place] [address] [place], [place] [address] Telephone: [place], [place] [address] [place], [place]  [address] Telephone: [phone] FAX: [phone] Covering Areas: [facility], [place] , [place], [place], and [place] COVID-19 Hours of Operation: [place] All administrators are available 24/7 - All services remain as scheduled   Expected Discharge Plan: [place] Barriers to Discharge: Continued Medical Work up, ED Patient Insisting on an Alternate Living Situation/Facility  Expected Discharge Plan and Services Expected Discharge Plan: [place] In-house Referral: Clinical Social Work     Living arrangements for the past 2 months: [place] ([place])                     Social Determinants of Health (SDOH) Interventions    Readmission Risk Interventions Readmission Risk Prevention Plan 05/23/2020 11/08/2019  Transportation Screening Complete Complete  Medication Review Press photographer) Complete Complete  PCP or Specialist appointment within 3-5 days of discharge (No Data) Not Complete  PCP/Specialist Appt Not Complete comments - Her PCP is out for 2-3 weeks so other two providers are booked out. The receptionist will call her if there is a cancellation.  [place] or Home Care Consult (No Data) Complete  SW Recovery Care/Counseling Consult Complete Complete   Palliative Care Screening Complete Not Applicable  Skilled Nursing Facility Complete Not Applicable  Some recent data might be hidden

## 2020-06-07 NOTE — TOC Transition Note (Signed)
Transition of Care [facility]) - CM/SW Discharge Note   Patient Details  Name: LITZI BINNING MRN: [id number] Date of Birth: 03-17-1961  Transition of Care [facility]) CM/SW Contact:  Berenice Bouton, LCSW Phone Number: 06/07/2020, 4:20 PM   Clinical Narrative:   Patient will discharge home via ambulance transport.  Services arrange: [facility] for nurse aid with [facility].  [facility]: PT/Nurse Aid/SW    Final next level of care: [place] Barriers to Discharge: Barriers Resolved   Patient Goals and CMS Choice Patient states their goals for this hospitalization and ongoing recovery are:: "I don't want to go back to [facility]  I don't want to go back to that place. I want to go home to my daughter's house." CMS Medicare.gov Compare Post Acute Care list provided to:: Patient Choice offered to / list presented to : Patient  Discharge Placement                       Discharge Plan and Services In-house Referral: Clinical Social Work                  Date DME Agency Contacted: 06/07/20 Time DME Agency Contacted: 1532 Representative spoke with at DME Agency: Floydene Flock [place]: Social Work, Therapist, sports, PT [facility] Agency: [place] ([place]) Date [place]: 06/07/20 Time [place]: 1534 Representative spoke with at [place]: [place] (SDOH) Interventions     Readmission Risk Interventions Readmission Risk Prevention Plan 05/23/2020 11/08/2019  Transportation Screening Complete Complete  Medication Review Press photographer) Complete Complete  PCP or Specialist appointment within 3-5 days of discharge (No Data) Not Complete  PCP/Specialist Appt Not Complete comments - Her PCP is out for 2-3 weeks so other two providers are booked out. The receptionist will call her if there is a cancellation.  [place] or Home Care Consult (No Data) Complete  SW Recovery Care/Counseling  Consult Complete Complete  Palliative Care Screening Complete Not Applicable  Skilled Nursing Facility Complete Not Applicable  Some recent data might be hidden

## 2020-06-08 ENCOUNTER — Emergency Department: Payer: Medicare Other

## 2020-06-08 ENCOUNTER — Observation Stay: Payer: Medicare Other

## 2020-06-08 DIAGNOSIS — I10 Essential (primary) hypertension: Secondary | ICD-10-CM | POA: Diagnosis not present

## 2020-06-08 DIAGNOSIS — R509 Fever, unspecified: Secondary | ICD-10-CM | POA: Diagnosis not present

## 2020-06-08 DIAGNOSIS — I4891 Unspecified atrial fibrillation: Secondary | ICD-10-CM

## 2020-06-08 DIAGNOSIS — B20 Human immunodeficiency virus [HIV] disease: Secondary | ICD-10-CM | POA: Diagnosis not present

## 2020-06-08 DIAGNOSIS — E876 Hypokalemia: Secondary | ICD-10-CM | POA: Diagnosis present

## 2020-06-08 LAB — CBC WITH DIFFERENTIAL/PLATELET
Abs Immature Granulocytes: 0.09 10*3/uL — ABNORMAL HIGH (ref 0.00–0.07)
Basophils Absolute: 0.1 10*3/uL (ref 0.0–0.1)
Basophils Relative: 1 %
Eosinophils Absolute: 0.5 10*3/uL (ref 0.0–0.5)
Eosinophils Relative: 5 %
HCT: 26.2 % — ABNORMAL LOW (ref 36.0–46.0)
Hemoglobin: 8.8 g/dL — ABNORMAL LOW (ref 12.0–15.0)
Immature Granulocytes: 1 %
Lymphocytes Relative: 21 %
Lymphs Abs: 2 10*3/uL (ref 0.7–4.0)
MCH: 29.7 pg (ref 26.0–34.0)
MCHC: 33.6 g/dL (ref 30.0–36.0)
MCV: 88.5 fL (ref 80.0–100.0)
Monocytes Absolute: 1.4 10*3/uL — ABNORMAL HIGH (ref 0.1–1.0)
Monocytes Relative: 14 %
Neutro Abs: 5.7 10*3/uL (ref 1.7–7.7)
Neutrophils Relative %: 58 %
Platelets: 363 10*3/uL (ref 150–400)
RBC: 2.96 MIL/uL — ABNORMAL LOW (ref 3.87–5.11)
RDW: 19.8 % — ABNORMAL HIGH (ref 11.5–15.5)
WBC: 9.7 10*3/uL (ref 4.0–10.5)
nRBC: 0.2 % (ref 0.0–0.2)

## 2020-06-08 LAB — BASIC METABOLIC PANEL
Anion gap: 10 (ref 5–15)
BUN: 22 mg/dL — ABNORMAL HIGH (ref 6–20)
CO2: 26 mmol/L (ref 22–32)
Calcium: 6.6 mg/dL — ABNORMAL LOW (ref 8.9–10.3)
Chloride: 102 mmol/L (ref 98–111)
Creatinine, Ser: 1.1 mg/dL — ABNORMAL HIGH (ref 0.44–1.00)
GFR, Estimated: 55 mL/min — ABNORMAL LOW (ref >60)
Glucose, Bld: 80 mg/dL (ref 70–99)
Potassium: 2.3 mmol/L — CL (ref 3.5–5.1)
Sodium: 138 mmol/L (ref 135–145)

## 2020-06-08 LAB — MAGNESIUM: Magnesium: 1 mg/dL — ABNORMAL LOW (ref 1.7–2.4)

## 2020-06-08 LAB — LACTIC ACID, PLASMA: Lactic Acid, Venous: 1.2 mmol/L (ref 0.5–1.9)

## 2020-06-08 LAB — PROCALCITONIN: Procalcitonin: 0.11 ng/mL

## 2020-06-08 MED ORDER — LOPERAMIDE HCL 2 MG PO CAPS
4.0000 mg | ORAL_CAPSULE | Freq: Once | ORAL | Status: AC
  Administered 2020-06-08: 4 mg via ORAL
  Filled 2020-06-08: qty 2

## 2020-06-08 MED ORDER — POTASSIUM CHLORIDE CRYS ER 20 MEQ PO TBCR
40.0000 meq | EXTENDED_RELEASE_TABLET | Freq: Once | ORAL | Status: AC
  Administered 2020-06-08: 40 meq via ORAL
  Filled 2020-06-08: qty 2

## 2020-06-08 MED ORDER — MAGNESIUM SULFATE 2 GM/50ML IV SOLN
2.0000 g | Freq: Once | INTRAVENOUS | Status: AC
  Administered 2020-06-08: 2 g via INTRAVENOUS
  Filled 2020-06-08: qty 50

## 2020-06-08 MED ORDER — POTASSIUM CHLORIDE 10 MEQ/100ML IV SOLN
10.0000 meq | INTRAVENOUS | Status: AC
  Administered 2020-06-08 – 2020-06-09 (×3): 10 meq via INTRAVENOUS
  Filled 2020-06-08 (×3): qty 100

## 2020-06-08 MED ORDER — ENOXAPARIN SODIUM 40 MG/0.4ML ~~LOC~~ SOLN: 40.0000 mg | SUBCUTANEOUS | Status: DC

## 2020-06-08 MED ORDER — ENOXAPARIN SODIUM 60 MG/0.6ML ~~LOC~~ SOLN
0.5000 mg/kg | SUBCUTANEOUS | Status: DC
  Administered 2020-06-08: 57.5 mg via SUBCUTANEOUS
  Filled 2020-06-08 (×2): qty 0.6

## 2020-06-08 NOTE — ED Notes (Signed)
Pt had large BM - Pt was cleaned, dried, turned/repositioned Pt ordered breakfast

## 2020-06-08 NOTE — ED Provider Notes (Signed)
Patient found to have temperature on vital sign recheck. Given this change blood work and infectious work up was initiated. No leukocytosis or lactic acidosis, however chemistry concerning for hypokalemia and hypomagnesemia. Given these findings will plan on admission for further work up and management.    Nance Pear, MD 06/08/20 1944

## 2020-06-08 NOTE — ED Notes (Addendum)
Date and time results received: 06/08/20 6:08 PM (use smartphrase ".now" to insert current time)  Test: K+ Critical Value: 2.3  Name of Provider Notified: Archie Balboa

## 2020-06-08 NOTE — ED Notes (Signed)
Peri-care completed, absorbant pads changed, wound dressing changed.

## 2020-06-08 NOTE — Progress Notes (Signed)
PHARMACIST - PHYSICIAN COMMUNICATION  CONCERNING:  Enoxaparin (Lovenox) for DVT Prophylaxis    RECOMMENDATION: Patient was prescribed enoxaprin 40mg  q24 hours for VTE prophylaxis.   Filed Weights   06/05/20 1253  Weight: 115.7 kg (255 lb)    Body mass index is 41.16 kg/m.  Estimated Creatinine Clearance: 72.1 mL/min (A) (by C-G formula based on SCr of 1.1 mg/dL (H)).   Based on [place] patient is candidate for enoxaparin 0.5mg /kg TBW SQ every 24 hours based on BMI being >30.  DESCRIPTION: Pharmacy has adjusted enoxaparin dose per [facility] policy.  Patient is now receiving enoxaparin 57.5 mg every 24 hours    Paulina Fusi, PharmD, BCPS 06/08/2020 8:47 PM

## 2020-06-08 NOTE — ED Notes (Signed)
Pt had large BM  Pt cleaned, dried, repositioned  Hand hygiene performed Pt given breakfast tray

## 2020-06-08 NOTE — ED Notes (Signed)
Pt sleeping. resps unlabored.

## 2020-06-08 NOTE — ED Notes (Signed)
Report to teresa, rn.

## 2020-06-08 NOTE — ED Notes (Signed)
Patient resting in position of comfort with eyes closed, respirations even and unlabored. Skin warm and dry, NAD noted

## 2020-06-08 NOTE — ED Notes (Addendum)
Complete linen change and peri-care completed. Wound dressing changed.

## 2020-06-08 NOTE — ED Notes (Signed)
Pt sleeping, resps unlabored.  

## 2020-06-08 NOTE — ED Notes (Signed)
Report given to [employer]

## 2020-06-08 NOTE — ED Notes (Signed)
Report to [employer]

## 2020-06-09 ENCOUNTER — Observation Stay: Payer: Medicare Other

## 2020-06-09 DIAGNOSIS — R509 Fever, unspecified: Secondary | ICD-10-CM

## 2020-06-09 DIAGNOSIS — E876 Hypokalemia: Secondary | ICD-10-CM

## 2020-06-09 DIAGNOSIS — I4891 Unspecified atrial fibrillation: Secondary | ICD-10-CM

## 2020-06-09 DIAGNOSIS — B20 Human immunodeficiency virus [HIV] disease: Secondary | ICD-10-CM | POA: Diagnosis not present

## 2020-06-09 DIAGNOSIS — I10 Essential (primary) hypertension: Secondary | ICD-10-CM

## 2020-06-09 DIAGNOSIS — R188 Other ascites: Secondary | ICD-10-CM

## 2020-06-09 LAB — BASIC METABOLIC PANEL
Anion gap: 10 (ref 5–15)
BUN: 21 mg/dL — ABNORMAL HIGH (ref 6–20)
CO2: 24 mmol/L (ref 22–32)
Calcium: 6.5 mg/dL — ABNORMAL LOW (ref 8.9–10.3)
Chloride: 102 mmol/L (ref 98–111)
Creatinine, Ser: 1.1 mg/dL — ABNORMAL HIGH (ref 0.44–1.00)
GFR, Estimated: 55 mL/min — ABNORMAL LOW (ref >60)
Glucose, Bld: 67 mg/dL — ABNORMAL LOW (ref 70–99)
Potassium: 2.9 mmol/L — ABNORMAL LOW (ref 3.5–5.1)
Sodium: 136 mmol/L (ref 135–145)

## 2020-06-09 LAB — CBC
HCT: 24.5 % — ABNORMAL LOW (ref 36.0–46.0)
Hemoglobin: 8 g/dL — ABNORMAL LOW (ref 12.0–15.0)
MCH: 29.4 pg (ref 26.0–34.0)
MCHC: 32.7 g/dL (ref 30.0–36.0)
MCV: 90.1 fL (ref 80.0–100.0)
Platelets: 372 10*3/uL (ref 150–400)
RBC: 2.72 MIL/uL — ABNORMAL LOW (ref 3.87–5.11)
RDW: 20 % — ABNORMAL HIGH (ref 11.5–15.5)
WBC: 9.6 10*3/uL (ref 4.0–10.5)
nRBC: 0 % (ref 0.0–0.2)

## 2020-06-09 MED ORDER — IOHEXOL 300 MG/ML  SOLN
75.0000 mL | Freq: Once | INTRAMUSCULAR | Status: AC | PRN
  Administered 2020-06-09: 75 mL via INTRAVENOUS

## 2020-06-09 MED ORDER — POTASSIUM CHLORIDE CRYS ER 20 MEQ PO TBCR: 40.0000 meq | EXTENDED_RELEASE_TABLET | Freq: Once | ORAL | Status: DC

## 2020-06-09 MED ORDER — ZINC OXIDE 40 % EX OINT
TOPICAL_OINTMENT | Freq: Two times a day (BID) | CUTANEOUS | Status: DC
  Filled 2020-06-09: qty 113

## 2020-06-09 MED ORDER — DIPHENOXYLATE-ATROPINE 2.5-0.025 MG PO TABS
1.0000 | ORAL_TABLET | Freq: Four times a day (QID) | ORAL | Status: DC | PRN
  Administered 2020-06-09: 1 via ORAL
  Filled 2020-06-09: qty 1

## 2020-06-09 MED ORDER — IOHEXOL 9 MG/ML PO SOLN
1000.0000 mL | ORAL | Status: AC | PRN
  Administered 2020-06-09: 1000 mL via ORAL

## 2020-06-09 NOTE — ED Notes (Signed)
Patient having almost continual diarrhea since oral contrast. MD paged to make aware.

## 2020-06-09 NOTE — Discharge Summary (Signed)
Physician Discharge Summary  [name]:[id number] DOB: [birth year] DOA: 06/05/2020  PCP: Theotis Burrow, MD  Admit date: 06/05/2020 Discharge date: 06/09/2020  Admitted From: SNF Disposition:  Home with home health  Recommendations for Outpatient Follow-up:  1. Follow up with PCP in 1-2 weeks 2.   Home Health:Yes Equipment/Devices:Intra-abdominal JP drain Discharge Condition:Stable CODE STATUS:FULL Diet recommendation: Heart Healthy / Carb Modified  Brief/Interim Summary: Melanie Bowen is a 59 y.o. female with medical history significant for HIV infection, hypertension, history of seizure, stroke, asthma, depression, COPD, GERD, morbid obesity and recent septic shock secondary to acute diverticulitis with diverticular abscess who is being called for admission due to new fever of unknown origin and hypokalemia.  Patient recently had prolonged hospitalization from 8/16 to 05/23/2020 for septic shock secondary to acute diverticulitis with diverticular abscess requiring vasopressor.  She was treated with IV fluids, empiric IV antibiotics and had a JP drain placed.  Also required 4 units of packed red blood cell transfusion for anemia.  Hospitalization also complicated by acute renal failure requiring brief periods of hemodialysis and new onset atrial fibrillation controlled with amiodarone.  She was then discharged to skilled nursing facility.  She then presented to the ED on 10/7 from nursing facility after routine labs showed her to be anemic with hemoglobin of 6.9.  She was transfused 1 unit of PRBC but refused to be discharged back to the facility due to her being displeased with their care.  She remained in the ED during this time while getting assistance from social work for discharge planning.  However today was found to have elevated temperature of 100.48F and labs showing hypokalemia of 2.3.  Patient denies any new symptoms.  She states she has had okay p.o.  intake.  Denies any nausea, vomiting or diarrhea.  No cough, chest pain or shortness of breath.  Thinks her temperature was as high because they did a rectal temp and she has been under her covers in bed. She has JP drain in place from recent diverticular abscess but denies any new worsening acute abdominal pain.  CBC shows no leukocytosis which actually improved from labs a few days ago.  Hemoglobin was stable at 8.8.  Lactic acid 1.2.  Procalcitonin of 0.11.  Patient was monitored on observation status overnight.  CT abdomen performed that demonstrated fluid pockets concerning for ascites versus developing abscess.  General surgery consultation was obtained.  No surgical intervention warranted at this time.  Case was discussed with interventional radiology who felt that the patient's abdominal CT findings were likely manifestation of ascites rather than developing abscess.  No urgent intervention was warranted.  The patient remained hemodynamically stable and considering her surgical history had an overall benign abdomen on exam we will discharge home with home health services at this time.  If patient's clinical status worsens she may be a good candidate for therapeutic and diagnostic paracentesis however this is not warranted at this time considering negative procalcitonin, lack of infectious focus.  Of note the fever noted on admission that triggered consultation with hospitalist service with 100.4 rectally.  It is unclear to this writer why that particular temperature was taken rectally as the remainder of the temperatures were taken orally.  No other clear infectious focus identified.  Patient had some electrolyte abnormalities identified on laboratory investigation that were corrected at time of discharge.  I had a long conversation with the patient on the day of discharge.  She is stable and comfortable with  returning back home.  TOC was involved to reach out to the patient's family who are comfortable  receiving the patient.  Full gamut of home health services ordered.  Patient discharged home with home health in stable condition.  Discharge Diagnoses:  Principal Problem:   Fever Active Problems:   HTN (hypertension)   HIV (human immunodeficiency virus infection) ([place])   Morbid (severe) obesity due to excess calories ([facility])   Hypokalemia   Unspecified atrial fibrillation ([place])  ?Fever  pt recently had diverticulitis with diverticular abscess s/p JP drain Repeat abdominal ultrasound today shows "Interval development of a new bland appearing collection within the left lower quadrant separate from the percutaneous drainage catheter possibly representing loculated ascites" - will obtain contrast CT abdomen to further assess  However aside from isolated fever, she has no leukocytosis, lactic acidosis or elevated procalcitonin and no worsening abdominal pain on exam to suggest active infection. General surgery was consulted.  They discussed the case with interventional radiology attending.  Images reviewed and no urgent intervention was warranted.  Patient remained stable with no clear signs of sepsis or infection.  She is discharged home in stable condition.  No antibiotics on discharge.  Home health services ordered.  Patient is complex and remains high risk for readmission given her extensive abdominal surgical history.  Should she clinically worsen at home recommend reevaluation in the emergency department and consideration for diagnostic and therapeutic paracentesis in addition to surgery and interventional radiology consultations.  Right stage 2 buttock wound  No signs of infection.  Daily wound care. WOC consulted with recommendations placed in home health orders  Hypokalemia  Improved at time of dc  Hx of atrial fibrillation continue amiodarine  HIV continue antiviral therapy Changed to [facility].  Reflected on home meds  HTN continue amlodipine  Morbid obesity BMI of  41  Discharge Instructions  Discharge Instructions    Diet - low sodium heart healthy   Complete by: As directed    Increase activity slowly   Complete by: As directed    No wound care   Complete by: As directed      Allergies as of 06/09/2020      Reactions   Acetaminophen Nausea And Vomiting   Ibuprofen Other (See Comments)   Reports causes bleeding   Gabapentin Rash   Morphine Itching, Rash   Morphine And Related Itching   Zanaflex  [tizanidine] Rash      Medication List    STOP taking these medications   dolutegravir 50 MG tablet Commonly known as: TIVICAY   rilpivirine 25 MG Tabs tablet Commonly known as: EDURANT     TAKE these medications   acidophilus Caps capsule Take 1 capsule by mouth daily. For GI prophylaxis for 7 days   albuterol 108 (90 Base) MCG/ACT inhaler Commonly known as: VENTOLIN HFA Inhale 2 puffs into the lungs every 6 (six) hours as needed for wheezing or shortness of breath.   ALPRAZolam 0.25 MG tablet Commonly known as: XANAX Take 0.25 mg by mouth every 8 (eight) hours as needed for anxiety. For 7 days   amiodarone 200 MG tablet Commonly known as: PACERONE Take 1 tablet (200 mg total) by mouth daily.   amLODipine 10 MG tablet Commonly known as: NORVASC Take 10 mg by mouth daily.   ascorbic acid 500 MG tablet Commonly known as: VITAMIN C Take 500 mg by mouth 2 (two) times daily.   bictegravir-emtricitabine-tenofovir AF 50-200-25 MG Tabs tablet Commonly known as: BIKTARVY Take 1  tablet by mouth daily.   Dexilant 60 MG capsule Generic drug: dexlansoprazole Take 60 mg by mouth daily.   famotidine 20 MG tablet Commonly known as: PEPCID Take 20 mg by mouth daily.   FLUoxetine 20 MG capsule Commonly known as: PROZAC Take 20 mg by mouth daily.   gabapentin 600 MG tablet Commonly known as: NEURONTIN Take 600 mg by mouth 3 (three) times daily.   HYDROcodone-acetaminophen 5-325 MG tablet Commonly known as:  NORCO/VICODIN Take 1 tablet by mouth every 6 (six) hours as needed.   methocarbamol 500 MG tablet Commonly known as: ROBAXIN Take 500 mg by mouth in the morning, at noon, in the evening, and at bedtime.   mirtazapine 15 MG tablet Commonly known as: REMERON Take 1 tablet (15 mg total) by mouth at bedtime.   multivitamin with minerals Tabs tablet Take 1 tablet by mouth daily.   torsemide 20 MG tablet Commonly known as: DEMADEX Take 2 tablets (40 mg total) by mouth daily.   Wixela Inhub 250-50 MCG/DOSE Aepb Generic drug: Fluticasone-Salmeterol Inhale 1 puff into the lungs 2 (two) times daily.       Allergies  Allergen Reactions  . Acetaminophen Nausea And Vomiting  . Ibuprofen Other (See Comments)    Reports causes bleeding  . Gabapentin Rash  . Morphine Itching and Rash  . Morphine And Related Itching  . Zanaflex  [Tizanidine] Rash    Consultations: Gen surgery  Procedures/Studies: CT ABDOMEN PELVIS WO CONTRAST  Result Date: 06/09/2020 CLINICAL DATA:  Recent diverticular abscess with drain. New ultrasound demonstrating possible loculated ascites. EXAM: CT ABDOMEN AND PELVIS WITHOUT CONTRAST TECHNIQUE: Multidetector CT imaging of the abdomen and pelvis was performed following the standard protocol without IV contrast. COMPARISON:  CT dated 05/12/2020 FINDINGS: Lower chest: There are trace to small bilateral pleural effusions, left greater than right.The heart size is normal. Hepatobiliary: The liver is normal. There is hyperdense material within the gallbladder similar to prior study.There is no biliary ductal dilation. Pancreas: Normal contours without ductal dilatation. No peripancreatic fluid collection. Spleen: Unremarkable. Adrenals/Urinary Tract: --Adrenal glands: Unremarkable. --Right kidney/ureter: No hydronephrosis or radiopaque kidney stones. --Left kidney/ureter: No hydronephrosis or radiopaque kidney stones. --Urinary bladder: There is mild asymmetric bladder wall  thickening, likely reactive. Stomach/Bowel: --Stomach/Duodenum: No hiatal hernia or other gastric abnormality. Normal duodenal course and caliber. --Small bowel: Unremarkable. --Colon: Again noted is sigmoid diverticulosis. A percutaneous drain is again located in the patient's midline pelvis. There is a persistent small fluid and air pocket surrounding the drainage catheter. There is persistent wall thickening of the sigmoid colon. There is extraluminal contrast adjacent to the catheter consistent with a persistent perforation or fistula. There is circumferential wall thickening of the splenic flexure of the colon. --Appendix: Not visualized. No right lower quadrant inflammation or free fluid. Vascular/Lymphatic: Atherosclerotic calcification is present within the non-aneurysmal abdominal aorta, without hemodynamically significant stenosis. --No retroperitoneal lymphadenopathy. --No mesenteric lymphadenopathy. --No pelvic or inguinal lymphadenopathy. Reproductive: Unremarkable Other: There is persistent extraluminal oral contrast in the right upper quadrant abutting the right left hepatic lobes. There is persistent complex free fluid along the margin of the right hepatic lobe. There are new loculated fluid collections in the abdomen. For example there is a collection in the left lower quadrant measuring approximately 16 by 3.8 cm that now appears well formed with a developing rind. There is an additional loculated appearing collection in the right mid abdomen measuring 8.7 cm. This collection likely is contiguous with the dominant collection in the  left lower quadrant. Musculoskeletal. No acute displaced fractures. IMPRESSION: 1. Unchanged positioning of the previously demonstrated percutaneous strain. There is a persistent small air and fluid collection which has improved since the prior study. There is a small amount of extraluminal oral contrast adjacent to the drain consistent with a persistent perforation or  fistula. 2. Organizing fluid collections in the abdomen concerning for infected ascites/developing abscesses. 3. Persistent extraluminal oral contrast in the upper abdomen, not substantially changed from prior study. 4. Sigmoid diverticulosis with persistent findings of diverticulitis. There is apparent wall thickening at the splenic flexure of the colon which may represent an additional area of infectious or inflammatory colitis. At this location, ischemic colitis is not excluded. 5. Additional chronic findings as detailed above. 6.  Aortic Atherosclerosis (ICD10-I70.0). Electronically Signed   By: Constance Holster M.D.   On: 06/09/2020 03:42   CT ABDOMEN PELVIS WO CONTRAST  Result Date: 05/12/2020 CLINICAL DATA:  Intra-abdominal abscess, fever EXAM: CT ABDOMEN AND PELVIS WITHOUT CONTRAST TECHNIQUE: Multidetector CT imaging of the abdomen and pelvis was performed following the standard protocol without IV contrast. COMPARISON:  None. FINDINGS: Lower chest: The visualized lung bases are clear bilaterally. Cardiac blood pool is of relatively low attenuation in keeping with at least moderate anemia. Global cardiac size within normal limits. Hepatobiliary: Hyperdensity of the gallbladder contents likely represents vicarious excretion of contrast. The gallbladder and liver are otherwise unremarkable. No intra or extrahepatic biliary ductal dilation. Pancreas: Unremarkable Spleen: Unremarkable Adrenals/Urinary Tract: Unremarkable Stomach/Bowel: There is extraluminal enteric contrast identified adjacent to the lateral segment of the left hepatic lobe which has increased in volume since prior examination. The exact source of leakage, however, is not clearly identified on this examination. The small bowel is diffusely mildly dilated containing both gas and enteric contrast in keeping with changes of a mild ileus. Oral contrast, however, does appear to extend into the terminal ileum. Adjacent to the terminal ileum is a  loculated collection of extraluminal gas measuring 6.7 x 2.5 cm. The pigtail drainage catheter is just within this collection, unchanged from prior examination. The volume of gas is increased since prior examination, possibly the result of a flushing of the drainage catheter or a a continued enteric communication. There is extensive descending and sigmoid colonic diverticulosis without superimposed inflammatory change. Large bowel otherwise unremarkable. Appendix normal. There is mild ascites present, similar in volume to prior examination. Diffuse body wall edema has improved in the interval since prior examination. Vascular/Lymphatic: The abdominal vasculature is age-appropriate with moderate aortoiliac atherosclerotic calcification. No aneurysm. No pathologic adenopathy within the abdomen and pelvis. Reproductive: Uterus and bilateral adnexa are unremarkable. Other: Rectum unremarkable. Musculoskeletal: Lumbar fusion with instrumentation has been performed. No lytic or blastic bone lesions. IMPRESSION: 1. Extraluminal enteric contrast adjacent to the lateral segment of the left hepatic lobe which has increased in volume since prior examination. The exact source of leakage, however, is not clearly identified on this examination. 2. Loculated collection of extraluminal gas adjacent to the terminal ileum. The pigtail drainage catheter is just within this collection, unchanged from prior examination. The volume of gas is increased since prior examination, possibly the result of a flushing of the drainage catheter or a continued enteric communication. 3. Mild ileus. 4. Improved body wall edema. Aortic Atherosclerosis (ICD10-I70.0). Electronically Signed   By: Fidela Salisbury MD   On: 05/12/2020 21:00   DG Shoulder Right  Result Date: 06/02/2020 CLINICAL DATA:  Pain status post fall. EXAM: RIGHT SHOULDER - 2+ VIEW COMPARISON:  04/16/2017 FINDINGS: There is an acute, impacted fracture of the right humeral head. There  is a probable displaced fracture fragment arising from the humeral head overlapping the glenoid. There is some inferior subluxation of the humeral head with a probable moderate-sized joint effusion. There is intra-articular debris. There is an acute fracture of the inferior glenoid. IMPRESSION: 1. Acute, impacted and displaced fracture of the right humeral head. 2. Probable small, mildly displaced fracture of the inferior glenoid. 3. Large joint effusion with intra-articular debris. Electronically Signed   By: Constance Holster M.D.   On: 06/02/2020 22:38   PERIPHERAL VASCULAR CATHETERIZATION  Result Date: 05/23/2020 See op note  US Abdomen Limited  Result Date: 06/08/2020 CLINICAL DATA:  Intra-abdominal abscess status post percutaneous drainage. Perforated diverticulitis. EXAM: ULTRASOUND ABDOMEN LIMITED COMPARISON:  CT 05/12/2020 FINDINGS: The percutaneous drainage catheter extending through the pannus into the mid abdomen communicates with the largely decompressed collection adjacent to several loops of bowel measuring roughly 2.3 x 2.4 cm in dimension. This is best appreciated on cine images. Within the left lower quadrant, a bland appearing fluid collection possibly representing loculated ascites has now developed, measuring roughly 13.8 x 7.0 cm in greatest dimension. IMPRESSION: Percutaneous drainage catheter communicates with a 2.3 cm collection adjacent to several loops of a bowel. Interval development of a new bland appearing collection within the left lower quadrant separate from the percutaneous drainage catheter possibly representing loculated ascites. This would be better assessed with contrast enhanced CT examination. Electronically Signed   By: Fidela Salisbury MD   On: 06/08/2020 21:58   DG Chest Portable 1 View  Result Date: 06/08/2020 CLINICAL DATA:  Fever EXAM: PORTABLE CHEST 1 VIEW COMPARISON:  November 04, 2019, May 06, 2019 FINDINGS: The cardiomediastinal silhouette is unchanged  and enlarged in contour.Removal of RIGHT chest CVC. Elevation of the RIGHT hemidiaphragm. No pleural effusion. No pneumothorax. No acute pleuroparenchymal abnormality. Visualized abdomen is unremarkable. Multilevel degenerative changes of the thoracic spine. IMPRESSION: 1. No acute cardiopulmonary abnormality. Electronically Signed   By: Valentino Saxon MD   On: 06/08/2020 16:39   CT IMAGE GUIDED DRAINAGE BY PERCUTANEOUS CATHETER  Result Date: 05/16/2020 INDICATION: 59 year old female with history of diverticulitis complicated by intra-abdominal abscess with prior lower abdominal drain placement, now malpositioned on recent CT abdomen pelvis. Additionally there are new intra-abdominal fluid collections in the upper abdomen, largest in the left mid abdomen. EXAM: CT GUIDED aspiration OF abdominal fluid collection CT-guided exchange of indwelling abscess drain MEDICATIONS: The patient is currently admitted to the hospital and receiving intravenous antibiotics. The antibiotics were administered within an appropriate time frame prior to the initiation of the procedure. ANESTHESIA/SEDATION: 0 mg IV Versed 50 mcg IV Fentanyl Moderate Sedation Time:  0 The patient was continuously monitored during the procedure by the interventional radiology nurse under my direct supervision. COMPLICATIONS: None immediate. TECHNIQUE: Informed written consent was obtained from the patient after a thorough discussion of the procedural risks, benefits and alternatives. All questions were addressed. Maximal Sterile Barrier Technique was utilized including caps, mask, sterile gowns, sterile gloves, sterile drape, hand hygiene and skin antiseptic. A timeout was performed prior to the initiation of the procedure. PROCEDURE: The left upper quadrant and indwelling lower abdominal drain were prepped with Chlorhexidine in a sterile fashion, and a sterile drape was applied covering the operative fields. A sterile gown and sterile gloves were  used for the procedure. Local anesthesia was provided with 1% Lidocaine. 18 gauge trocar needle was directed into the left abdominal  fluid collection from an anterior approach. Aspiration was performed. A sample was collected and sent to lab for culture. Next, the indwelling 12 [place] lower abdominal fluid collection drain was exchanged over an Amplatz wire for a 14 [place] drain. The pigtail 4 shin was locked in the deep part of the fluid collection. The drain was sutured in place and a dressing was applied. FINDINGS: Left abdominal fluid collection yielded approximately 250 mL of serous fluid. The fluid collection was essentially absent after aspiration. Given the non purulent nature of this fluid, no drain was placed. The indwelling lower abdominal drain appears in better position than comparison CT, with the pigtail portion in the fluid collection and the radiopaque marker deep to the rectus she. There was succus draining from around the catheter entry site. Successful exchange with 14 French drain, now position deeper within the fluid collection. IMPRESSION: 1. Left upper abdominal fluid collection yielded serous fluid, therefore no drain was placed. Recommend follow-up fluid culture. 2. Successful upsize and repositioning of the indwelling lower abdominal drain from 12 [place] to 14 [place]. Given feculent output around the drain and within the drain bulb upon completion of the procedure, it is suspected that the draining fluid collection represents perforation of a diverticulum in the setting of diverticulitis. Electronically Signed   By: Ruthann Cancer MD   On: 05/16/2020 07:49    (Echo, Carotid, EGD, Colonoscopy, ERCP)    Subjective: Patient seen and examined on the day of discharge.  She is stable, no distress.  No infection identified.  Findings discussed with patient at bedside.  All questions answered to patient satisfaction.  Patient discharged stable condition.  Discharge Exam: Vitals:    06/09/20 1247 06/09/20 1405  BP: 107/69 107/68  Pulse: (!) 102 (!) 101  Resp: 16 16  Temp: 98.1 F (36.7 C) 97.9 F (36.6 C)  SpO2: 98% 98%   Vitals:   06/09/20 0907 06/09/20 1100 06/09/20 1247 06/09/20 1405  BP: 104/62  107/69 107/68  Pulse: 100 (!) 108 (!) 102 (!) 101  Resp: 18  16 16   Temp:   98.1 F (36.7 C) 97.9 F (36.6 C)  TempSrc:   Oral Oral  SpO2: 97% 98% 98% 98%  Weight:      Height:        General: Pt is alert, awake, not in acute distress Cardiovascular: RRR, S1/S2 +, no rubs, no gallops Respiratory: CTA bilaterally, no wheezing, no rhonchi Abdominal: Obese, abdominal drain in place, belly not tender on exam. Extremities: no edema, no cyanosis    The results of significant diagnostics from this hospitalization (including imaging, microbiology, ancillary and laboratory) are listed below for reference.     Microbiology: Recent Results (from the past 240 hour(s))  Respiratory Panel by RT PCR (Flu A&B, Covid) - Nasopharyngeal Swab     Status: None   Collection Time: 06/05/20  1:05 PM   Specimen: Nasopharyngeal Swab  Result Value Ref Range Status   SARS Coronavirus 2 by RT PCR NEGATIVE NEGATIVE Final    Comment: (NOTE) SARS-CoV-2 target nucleic acids are NOT DETECTED.  The SARS-CoV-2 RNA is generally detectable in upper respiratoy specimens during the acute phase of infection. The lowest concentration of SARS-CoV-2 viral copies this assay can detect is 131 copies/mL. A negative result does not preclude SARS-Cov-2 infection and should not be used as the sole basis for treatment or other patient management decisions. A negative result may occur with  improper specimen collection/handling, submission of specimen other than nasopharyngeal  swab, presence of viral mutation(s) within the areas targeted by this assay, and inadequate number of viral copies (<131 copies/mL). A negative result must be combined with clinical observations, patient history, and  epidemiological information. The expected result is Negative.  Fact Sheet for Patients:  PinkCheek.be  Fact Sheet for Healthcare Providers:  GravelBags.it  This test is no t yet approved or cleared by the [place] FDA and  has been authorized for detection and/or diagnosis of SARS-CoV-2 by FDA under an Emergency Use Authorization (EUA). This EUA will remain  in effect (meaning this test can be used) for the duration of the COVID-19 declaration under Section 564(b)(1) of the Act, 21 U.S.C. section 360bbb-3(b)(1), unless the authorization is terminated or revoked sooner.     Influenza A by PCR NEGATIVE NEGATIVE Final   Influenza B by PCR NEGATIVE NEGATIVE Final    Comment: (NOTE) The Xpert Xpress SARS-CoV-2/FLU/RSV assay is intended as an aid in  the diagnosis of influenza from Nasopharyngeal swab specimens and  should not be used as a sole basis for treatment. Nasal washings and  aspirates are unacceptable for Xpert Xpress SARS-CoV-2/FLU/RSV  testing.  Fact Sheet for Patients: PinkCheek.be  Fact Sheet for Healthcare Providers: GravelBags.it  This test is not yet approved or cleared by the [place] FDA and  has been authorized for detection and/or diagnosis of SARS-CoV-2 by  FDA under an Emergency Use Authorization (EUA). This EUA will remain  in effect (meaning this test can be used) for the duration of the  Covid-19 declaration under Section 564(b)(1) of the Act, 21  U.S.C. section 360bbb-3(b)(1), unless the authorization is  terminated or revoked. Performed at [facility], [address] [place], [place] [address]   Blood culture (routine x 2)     Status: None (Preliminary result)   Collection Time: 06/08/20  5:42 PM   Specimen: BLOOD  Result Value Ref Range Status   Specimen Description BLOOD RIGHT HAND  Final   Special Requests    Final    BOTTLES DRAWN AEROBIC AND ANAEROBIC Blood Culture adequate volume   Culture   Final    NO GROWTH < 24 HOURS Performed at [facility], [address] [place], [place] [address]    Report Status PENDING  Incomplete  Blood culture (routine x 2)     Status: None (Preliminary result)   Collection Time: 06/08/20  5:42 PM   Specimen: BLOOD  Result Value Ref Range Status   Specimen Description BLOOD R EJ  Final   Special Requests   Final    BOTTLES DRAWN AEROBIC AND ANAEROBIC Blood Culture adequate volume   Culture   Final    NO GROWTH < 24 HOURS Performed at [facility], [address] [place], [place] [address]    Report Status PENDING  Incomplete     Labs: BNP (last 3 results) Recent Labs    11/04/19 0821  BNP 54.2   Basic Metabolic Panel: Recent Labs  Lab 06/05/20 1305 06/08/20 1653 06/09/20 0441  NA 137 138 136  K 2.9* 2.3* 2.9*  CL 102 102 102  CO2 22 26 24   GLUCOSE 63* 80 67*  BUN 30* 22* 21*  CREATININE 0.92 1.10* 1.10*  CALCIUM 7.2* 6.6* 6.5*  MG  --  1.0*  --    Liver Function Tests: Recent Labs  Lab 06/05/20 1305  AST 27  ALT 19  ALKPHOS 96  BILITOT 1.0  PROT 5.9*  ALBUMIN 1.2*  No results for input(s): LIPASE, AMYLASE in the last 168 hours. No results for input(s): AMMONIA in the last 168 hours. CBC: Recent Labs  Lab 06/05/20 1522 06/08/20 1653 06/09/20 0441  WBC 13.3* 9.7 9.6  NEUTROABS 7.8* 5.7  --   HGB 6.9* 8.8* 8.0*  HCT 20.6* 26.2* 24.5*  MCV 87.7 88.5 90.1  PLT 243 363 372   Cardiac Enzymes: No results for input(s): CKTOTAL, CKMB, CKMBINDEX, TROPONINI in the last 168 hours. BNP: Invalid input(s): POCBNP CBG: Recent Labs  Lab 06/05/20 2218 06/05/20 2333 06/06/20 0641 06/06/20 1751  GLUCAP 69* 96 76 84   D-Dimer No results for input(s): DDIMER in the last 72 hours. Hgb A1c No results for input(s): HGBA1C in the last 72 hours. Lipid Profile No results for input(s): CHOL, HDL, LDLCALC,  TRIG, CHOLHDL, LDLDIRECT in the last 72 hours. Thyroid function studies No results for input(s): TSH, T4TOTAL, T3FREE, THYROIDAB in the last 72 hours.  Invalid input(s): FREET3 Anemia work up No results for input(s): VITAMINB12, FOLATE, FERRITIN, TIBC, IRON, RETICCTPCT in the last 72 hours. Urinalysis    Component Value Date/Time   COLORURINE YELLOW (A) 04/15/2020 0428   APPEARANCEUR HAZY (A) 04/15/2020 0428   APPEARANCEUR Hazy 05/19/2014 1633   LABSPEC >1.046 (H) 04/15/2020 0428   LABSPEC 1.020 05/19/2014 1633   PHURINE 5.0 04/15/2020 0428   GLUCOSEU NEGATIVE 04/15/2020 0428   GLUCOSEU Negative 05/19/2014 1633   HGBUR NEGATIVE 04/15/2020 0428   BILIRUBINUR NEGATIVE 04/15/2020 0428   BILIRUBINUR Negative 05/19/2014 1633   KETONESUR NEGATIVE 04/15/2020 0428   PROTEINUR NEGATIVE 04/15/2020 0428   NITRITE NEGATIVE 04/15/2020 0428   LEUKOCYTESUR NEGATIVE 04/15/2020 0428   LEUKOCYTESUR Negative 05/19/2014 1633   Sepsis Labs Invalid input(s): PROCALCITONIN,  WBC,  LACTICIDVEN Microbiology Recent Results (from the past 240 hour(s))  Respiratory Panel by RT PCR (Flu A&B, Covid) - Nasopharyngeal Swab     Status: None   Collection Time: 06/05/20  1:05 PM   Specimen: Nasopharyngeal Swab  Result Value Ref Range Status   SARS Coronavirus 2 by RT PCR NEGATIVE NEGATIVE Final    Comment: (NOTE) SARS-CoV-2 target nucleic acids are NOT DETECTED.  The SARS-CoV-2 RNA is generally detectable in upper respiratoy specimens during the acute phase of infection. The lowest concentration of SARS-CoV-2 viral copies this assay can detect is 131 copies/mL. A negative result does not preclude SARS-Cov-2 infection and should not be used as the sole basis for treatment or other patient management decisions. A negative result may occur with  improper specimen collection/handling, submission of specimen other than nasopharyngeal swab, presence of viral mutation(s) within the areas targeted by this assay,  and inadequate number of viral copies (<131 copies/mL). A negative result must be combined with clinical observations, patient history, and epidemiological information. The expected result is Negative.  Fact Sheet for Patients:  PinkCheek.be  Fact Sheet for Healthcare Providers:  GravelBags.it  This test is no t yet approved or cleared by the [place] FDA and  has been authorized for detection and/or diagnosis of SARS-CoV-2 by FDA under an Emergency Use Authorization (EUA). This EUA will remain  in effect (meaning this test can be used) for the duration of the COVID-19 declaration under Section 564(b)(1) of the Act, 21 U.S.C. section 360bbb-3(b)(1), unless the authorization is terminated or revoked sooner.     Influenza A by PCR NEGATIVE NEGATIVE Final   Influenza B by PCR NEGATIVE NEGATIVE Final    Comment: (NOTE) The Xpert Xpress SARS-CoV-2/FLU/RSV assay is intended  as an aid in  the diagnosis of influenza from Nasopharyngeal swab specimens and  should not be used as a sole basis for treatment. Nasal washings and  aspirates are unacceptable for Xpert Xpress SARS-CoV-2/FLU/RSV  testing.  Fact Sheet for Patients: PinkCheek.be  Fact Sheet for Healthcare Providers: GravelBags.it  This test is not yet approved or cleared by the [place] FDA and  has been authorized for detection and/or diagnosis of SARS-CoV-2 by  FDA under an Emergency Use Authorization (EUA). This EUA will remain  in effect (meaning this test can be used) for the duration of the  Covid-19 declaration under Section 564(b)(1) of the Act, 21  U.S.C. section 360bbb-3(b)(1), unless the authorization is  terminated or revoked. Performed at [facility], [address] [place], [place] [address]   Blood culture (routine x 2)     Status: None (Preliminary result)   Collection Time:  06/08/20  5:42 PM   Specimen: BLOOD  Result Value Ref Range Status   Specimen Description BLOOD RIGHT HAND  Final   Special Requests   Final    BOTTLES DRAWN AEROBIC AND ANAEROBIC Blood Culture adequate volume   Culture   Final    NO GROWTH < 24 HOURS Performed at [facility], [address], [place], [place] [address]    Report Status PENDING  Incomplete  Blood culture (routine x 2)     Status: None (Preliminary result)   Collection Time: 06/08/20  5:42 PM   Specimen: BLOOD  Result Value Ref Range Status   Specimen Description BLOOD R EJ  Final   Special Requests   Final    BOTTLES DRAWN AEROBIC AND ANAEROBIC Blood Culture adequate volume   Culture   Final    NO GROWTH < 24 HOURS Performed at [facility], [address], [place], [place] [address]    Report Status PENDING  Incomplete     Time coordinating discharge: Over 30 minutes  SIGNED:   Sidney Ace, MD  [facility] 06/09/2020, 4:39 PM Pager   If 7PM-7AM, please contact night-coverage

## 2020-06-09 NOTE — TOC Transition Note (Addendum)
Transition of Care [facility]) - CM/SW Discharge Note   Patient Details  Name: SAKARI RAISANEN MRN: [id number] Date of Birth: 1961-03-03  Transition of Care [facility]) CM/SW Contact:  Victorino Dike, RN Phone Number: [phone] 06/09/2020, 1:21 PM   Clinical Narrative:     210:  RN reached out, EMS was wanting to make sure a family member was going to be at the house.  RN was unable to reach daughter.    I was unable to reach daughter that lives in the home, number is incorrect.  Called other daughter Marisue [place]), she will be at home.  Given updated number for daughter Carloyn Jaeger, will update this in the computer.    1:00pm  Met with patient this morning, she is still declining SNF at this time.  Home Health:  PT/OT/RN/SW set up with [facility].  Patient lives with daughter and feels comfortable going home at this time.     Final next level of care: [place] Barriers to Discharge: Barriers Resolved   Patient Goals and CMS Choice Patient states their goals for this hospitalization and ongoing recovery are:: "I don't want to go back to [facility].  I don't want to go back to that place. I want to go home to my daughter's house." CMS Medicare.gov Compare Post Acute Care list provided to:: Patient Choice offered to / list presented to : Patient  Discharge Placement                       Discharge Plan and Services In-house Referral: Clinical Social Work                  Date DME Agency Contacted: 06/07/20 Time DME Agency Contacted: 1532 Representative spoke with at DME Agency: Floydene Flock HH Arranged: RN, PT, OT, Social Work [facility] Agency: [place] ([place]) Date [place]: 06/09/20 Time [place]: 1315 Representative spoke with at [place]: [place] (SDOH) Interventions     Readmission Risk Interventions Readmission Risk Prevention Plan 05/23/2020 11/08/2019   Transportation Screening Complete Complete  Medication Review Press photographer) Complete Complete  PCP or Specialist appointment within 3-5 days of discharge (No Data) Not Complete  PCP/Specialist Appt Not Complete comments - Her PCP is out for 2-3 weeks so other two providers are booked out. The receptionist will call her if there is a cancellation.  [place] or Home Care Consult (No Data) Complete  SW Recovery Care/Counseling Consult Complete Complete  Palliative Care Screening Complete Not Applicable  Skilled Nursing Facility Complete Not Applicable  Some recent data might be hidden

## 2020-06-09 NOTE — TOC Progression Note (Signed)
Transition of Care [facility]) - Progression Note    Patient Details  Name: Melanie Bowen MRN: [id number] Date of Birth: 02/06/1961  Transition of Care [facility]) CM/SW [place], [place] Phone Number: [phone] 06/09/2020, 2:52 PM  Clinical Narrative:     CSW received call from Ms. Goering wanting to know when home health services would begin.  CSW contacted Corene Cornea at [facility] who contacted Ms. Kitts about home health services schedule.  Expected Discharge Plan: [place] Barriers to Discharge: Barriers Resolved  Expected Discharge Plan and Services Expected Discharge Plan: [place] In-house Referral: Clinical Social Work     Living arrangements for the past 2 months: [place] ([place]) Expected Discharge Date: 06/09/20                   Date DME Agency Contacted: 06/07/20 Time DME Agency Contacted: 1532 Representative spoke with at DME Agency: Floydene Flock HH Arranged: RN, PT, OT, Social Work [facility] Agency: [place] ([place]) Date [place]: 06/09/20 Time [place]: 0354 Representative spoke with at [place]: [place] (SDOH) Interventions    Readmission Risk Interventions Readmission Risk Prevention Plan 05/23/2020 11/08/2019  Transportation Screening Complete Complete  Medication Review Press photographer) Complete Complete  PCP or Specialist appointment within 3-5 days of discharge (No Data) Not Complete  PCP/Specialist Appt Not Complete comments - Her PCP is out for 2-3 weeks so other two providers are booked out. The receptionist will call her if there is a cancellation.  [place] or Home Care Consult (No Data) Complete  SW Recovery Care/Counseling Consult Complete Complete  Palliative Care Screening Complete Not Applicable  Skilled Nursing Facility Complete Not Applicable  Some recent data might be hidden

## 2020-06-09 NOTE — H&P (Signed)
History and Physical    Melanie Bowen DQQ:[id number] DOB: 11-03-1960 DOA: 06/05/2020  PCP: Theotis Burrow, MD  Patient coming from: SNF  I have personally briefly reviewed patient's old medical records in [place]  Chief Complaint: new fever, hypokalemia  HPI: Melanie Bowen is a 59 y.o. female with medical history significant for HIV infection, hypertension, history of seizure, stroke, asthma, depression, COPD, GERD, morbid obesity and recent septic shock secondary to acute diverticulitis with diverticular abscess who is being called for admission due to new fever of unknown origin and hypokalemia.  Patient recently had prolonged hospitalization from 8/16 to 05/23/2020 for septic shock secondary to acute diverticulitis with diverticular abscess requiring vasopressor.  She was treated with IV fluids, empiric IV antibiotics and had a JP drain placed.  Also required 4 units of packed red blood cell transfusion for anemia.  Hospitalization also complicated by acute renal failure requiring brief periods of hemodialysis and new onset atrial fibrillation controlled with amiodarone.  She was then discharged to skilled nursing facility.  She then presented to the ED on 10/7 from nursing facility after routine labs showed her to be anemic with hemoglobin of 6.9.  She was transfused 1 unit of PRBC but refused to be discharged back to the facility due to her being displeased with their care.  She remained in the ED during this time while getting assistance from social work for discharge planning.  However today was found to have elevated temperature of 100.22F and labs showing hypokalemia of 2.3.  Patient denies any new symptoms.  She states she has had okay p.o. intake.  Denies any nausea, vomiting or diarrhea.  No cough, chest pain or shortness of breath.  Thinks her temperature was as high because they did a rectal temp and she has been under her covers in bed. She has JP drain in place  from recent diverticular abscess but denies any new worsening acute abdominal pain.  CBC shows no leukocytosis which actually improved from labs a few days ago.  Hemoglobin was stable at 8.8.  Lactic acid 1.2.  Procalcitonin of 0.11.  Review of Systems: Constitutional: No Weight Change, + Fever ENT/Mouth: No sore throat, No Rhinorrhea Eyes: No Eye Pain, No Vision Changes Cardiovascular: No Chest Pain, no SOB Respiratory: No Cough, No Sputum Gastrointestinal: No Nausea, No Vomiting, No Diarrhea, No Constipation, No Pain Genitourinary: no Urinary Incontinence Musculoskeletal: No Arthralgias, No Myalgias Skin: No Skin Lesions, No Pruritus, Neuro: no Weakness, No Numbness,   Psych: No Anxiety/Panic, No Depression, no decrease appetite Heme/Lymph: No Bruising, No Bleeding   Past Medical History:  Diagnosis Date  . Acute GI hemorrhage 12/2015   required transfusion  . Allergic rhinitis   . Arthritis    DDD  . Asthma   . Blepharitis   . Bronchitis   . Cigarette smoker   . Claustrophobia   . COPD (chronic obstructive pulmonary disease) ([place])   . Cough   . DDD (degenerative disc disease), lumbar 04/10/2014  . Depression   . Diverticulosis   . Ectopic pregnancy   . Eye irritation   . Genital herpes   . GERD (gastroesophageal reflux disease)   . Headache   . HIV (human immunodeficiency virus infection) ([place])   . Hypertension   . Paresthesia of hand, bilateral   . Shortness of breath dyspnea    at times due to asthma    Past Surgical History:  Procedure Laterality Date  . BACK SURGERY  12/2014  lumbar lam. Dr. Deri Fuelling  . BACK SURGERY  12/25/2012   Removal lumbosubarachnoid shunt system, L5-S1 TF ESI, Dr. Virl Axe Minchew  . back surgery may 2016     lumbar   . BUNIONECTOMY Bilateral    feet  . COLONOSCOPY  12/06/2014   rescheduled from 11/01/2014 due to poor prep  . DIAGNOSTIC LAPAROSCOPY    . DIALYSIS/PERMA CATHETER INSERTION N/A 04/25/2020   Procedure:  DIALYSIS/PERMA CATHETER INSERTION;  Surgeon: Algernon Huxley, MD;  Location: [facility];  Service: Cardiovascular;  Laterality: N/A;  . DIALYSIS/PERMA CATHETER REMOVAL N/A 05/23/2020   Procedure: DIALYSIS/PERMA CATHETER REMOVAL;  Surgeon: Algernon Huxley, MD;  Location: [facility];  Service: Cardiovascular;  Laterality: N/A;  . DISTAL FEMUR OSTEOTOMY Right    Dr. Earnestine Leys  . ESOPHAGOGASTRODUODENOSCOPY Left 01/11/2016   Procedure: ESOPHAGOGASTRODUODENOSCOPY (EGD);  Surgeon: Hulen Luster, MD;  Location: [facility] END[facility]COPY;  Service: Endoscopy;  Laterality: Left;  . Finger fracture Right    5th finger  . FRACTURE SURGERY Right 01/25/2014   closed reduction & percutaneous pinning of 5th digit  . HAND SURGERY Bilateral    carpal tunnel  . JOINT REPLACEMENT Bilateral    knees  . KNEE SURGERY Bilateral 2003,2007   total Joint  . LAPAROSCOPY FOR ECTOPIC PREGNANCY    . PERIPHERAL VASCULAR CATHETERIZATION N/A 01/12/2016   Procedure: Visceral Angiography;  Surgeon: Algernon Huxley, MD;  Location: [facility];  Service: Cardiovascular;  Laterality: N/A;  . PERIPHERAL VASCULAR CATHETERIZATION N/A 01/12/2016   Procedure: Visceral Artery Intervention;  Surgeon: Algernon Huxley, MD;  Location: [facility];  Service: Cardiovascular;  Laterality: N/A;  . REVISION TOTAL KNEE ARTHROPLASTY Right 12/31/2002   Dr. Marry Guan  . TUBAL LIGATION    . VIDEO BRONCHOSCOPY N/A 06/30/2015   Procedure: VIDEO BRONCHOSCOPY WITHOUT FLUORO;  Surgeon: Vilinda Boehringer, MD;  Location: [facility];  Service: Cardiopulmonary;  Laterality: N/A;     reports that she has been smoking cigarettes. She has a 9.50 pack-year smoking history. She has never used smokeless tobacco. She reports current alcohol use of about 5.0 standard drinks of alcohol per week. She reports previous drug use. Social History  Allergies  Allergen Reactions  . Acetaminophen Nausea And Vomiting  . Ibuprofen Other (See Comments)    Reports  causes bleeding  . Gabapentin Rash  . Morphine Itching and Rash  . Morphine And Related Itching  . Zanaflex  [Tizanidine] Rash    Family History  Problem Relation Age of Onset  . Breast cancer Sister      Prior to Admission medications   Medication Sig Start Date End Date Taking? Authorizing Provider  acidophilus (RISAQUAD) CAPS capsule Take 1 capsule by mouth daily. For GI prophylaxis for 7 days 06/04/20 06/10/20 Yes [provider]  albuterol (VENTOLIN HFA) 108 (90 Base) MCG/ACT inhaler Inhale 2 puffs into the lungs every 6 (six) hours as needed for wheezing or shortness of breath. 05/21/19  Yes Laban Emperor, PA-C  ALPRAZolam Duanne Moron) 0.25 MG tablet Take 0.25 mg by mouth every 8 (eight) hours as needed for anxiety. For 7 days 06/04/20 06/10/20 Yes [provider]  amiodarone (PACERONE) 200 MG tablet Take 1 tablet (200 mg total) by mouth daily. 05/24/20  Yes Sreenath, Sudheer B, MD  amLODipine (NORVASC) 10 MG tablet Take 10 mg by mouth daily.    Yes [provider]  ascorbic acid (VITAMIN C) 500 MG tablet Take 500 mg by mouth  2 (two) times daily.   Yes [provider]  DEXILANT 60 MG capsule Take 60 mg by mouth daily.  04/25/19  Yes [provider]  famotidine (PEPCID) 20 MG tablet Take 20 mg by mouth daily.  05/08/19  Yes [provider]  FLUoxetine (PROZAC) 20 MG capsule Take 20 mg by mouth daily.   Yes [provider]  HYDROcodone-acetaminophen (NORCO/VICODIN) 5-325 MG tablet Take 1 tablet by mouth every 6 (six) hours as needed. 06/03/20  Yes Horton, Barbette Hair, MD  methocarbamol (ROBAXIN) 500 MG tablet Take 500 mg by mouth in the morning, at noon, in the evening, and at bedtime. 03/31/20  Yes [provider]  Multiple Vitamin (MULTIVITAMIN WITH MINERALS) TABS tablet Take 1 tablet by mouth daily.   Yes [provider]  torsemide (DEMADEX) 20 MG tablet Take 2 tablets (40 mg total) by mouth daily. 05/24/20  Yes  Sreenath, Sudheer B, MD  WIXELA INHUB 250-50 MCG/DOSE AEPB Inhale 1 puff into the lungs 2 (two) times daily. 11/05/19  Yes [provider]  bictegravir-emtricitabine-tenofovir AF (BIKTARVY) 50-200-25 MG TABS tablet Take 1 tablet by mouth daily. 06/06/20   Tsosie Billing, MD  gabapentin (NEURONTIN) 600 MG tablet Take 600 mg by mouth 3 (three) times daily. Patient not taking: Reported on 06/05/2020 04/02/20   [provider]  mirtazapine (REMERON) 15 MG tablet Take 1 tablet (15 mg total) by mouth at bedtime. Patient not taking: Reported on 06/05/2020 05/23/20   Sidney Ace, MD    Physical Exam: Vitals:   06/08/20 0936 06/08/20 1405 06/08/20 1529 06/08/20 1745  BP: (!) 108/56 (!) 101/59  (!) 107/58  Pulse:  (!) 101  (!) 102  Resp:  18  18  Temp:  99 F (37.2 C) (!) 100.4 F (38 C)   TempSrc:  Oral Rectal   SpO2:  100%  98%  Weight:      Height:        Constitutional: NAD, calm, comfortable, morbidly obese female sitting upright in bed Vitals:   06/08/20 0936 06/08/20 1405 06/08/20 1529 06/08/20 1745  BP: (!) 108/56 (!) 101/59  (!) 107/58  Pulse:  (!) 101  (!) 102  Resp:  18  18  Temp:  99 F (37.2 C) (!) 100.4 F (38 C)   TempSrc:  Oral Rectal   SpO2:  100%  98%  Weight:      Height:       Eyes: PERRL, lids and conjunctivae normal ENMT: Mucous membranes are moist.  Neck: normal, supple Respiratory: clear to auscultation bilaterally, no wheezing, no crackles. Normal respiratory effort. No accessory muscle use.  Cardiovascular: Regular rate and rhythm, no murmurs / rubs / gallops. No extremity edema.  Abdomen: no tenderness, no masses palpated.  Bowel sounds positive.  JP drain to bulb in left lower quadrant with drainage of milky colored liquid Musculoskeletal: no clubbing / cyanosis. No joint deformity upper and lower extremities. Good ROM, no contractures. Normal muscle tone.  Skin: Large stage II right buttocks wound with clean overlying bandage  neurologic: CN 2-12 grossly intact. Sensation intact,  Strength 5/5 in all 4.  Psychiatric: Normal judgment and insight. Alert and oriented x 3. Normal mood.     Labs on Admission: I have personally reviewed following labs and imaging studies  CBC: Recent Labs  Lab 06/05/20 1522 06/08/20 1653  WBC 13.3* 9.7  NEUTROABS 7.8* 5.7  HGB 6.9* 8.8*  HCT 20.6* 26.2*  MCV 87.7 88.5  PLT 243  381   Basic Metabolic Panel: Recent Labs  Lab 06/05/20 1305 06/08/20 1653  NA 137 138  K 2.9* 2.3*  CL 102 102  CO2 22 26  GLUCOSE 63* 80  BUN 30* 22*  CREATININE 0.92 1.10*  CALCIUM 7.2* 6.6*  MG  --  1.0*   GFR: Estimated Creatinine Clearance: 72.1 mL/min (A) (by C-G formula based on SCr of 1.1 mg/dL (H)). Liver Function Tests: Recent Labs  Lab 06/05/20 1305  AST 27  ALT 19  ALKPHOS 96  BILITOT 1.0  PROT 5.9*  ALBUMIN 1.2*   No results for input(s): LIPASE, AMYLASE in the last 168 hours. No results for input(s): AMMONIA in the last 168 hours. Coagulation Profile: No results for input(s): INR, PROTIME in the last 168 hours. Cardiac Enzymes: No results for input(s): CKTOTAL, CKMB, CKMBINDEX, TROPONINI in the last 168 hours. BNP (last 3 results) No results for input(s): PROBNP in the last 8760 hours. HbA1C: No results for input(s): HGBA1C in the last 72 hours. CBG: Recent Labs  Lab 06/05/20 2218 06/05/20 2333 06/06/20 0641 06/06/20 1751  GLUCAP 69* 96 76 84   Lipid Profile: No results for input(s): CHOL, HDL, LDLCALC, TRIG, CHOLHDL, LDLDIRECT in the last 72 hours. Thyroid Function Tests: No results for input(s): TSH, T4TOTAL, FREET4, T3FREE, THYROIDAB in the last 72 hours. Anemia Panel: No results for input(s): VITAMINB12, FOLATE, FERRITIN, TIBC, IRON, RETICCTPCT in the last 72 hours. Urine analysis:    Component Value Date/Time   COLORURINE YELLOW (A) 04/15/2020 0428   APPEARANCEUR HAZY (A) 04/15/2020 0428   APPEARANCEUR Hazy 05/19/2014 1633   LABSPEC >1.046 (H)  04/15/2020 0428   LABSPEC 1.020 05/19/2014 1633   PHURINE 5.0 04/15/2020 0428   GLUCOSEU NEGATIVE 04/15/2020 0428   GLUCOSEU Negative 05/19/2014 1633   HGBUR NEGATIVE 04/15/2020 0428   BILIRUBINUR NEGATIVE 04/15/2020 0428   BILIRUBINUR Negative 05/19/2014 1633   KETONESUR NEGATIVE 04/15/2020 0428   PROTEINUR NEGATIVE 04/15/2020 0428   NITRITE NEGATIVE 04/15/2020 0428   LEUKOCYTESUR NEGATIVE 04/15/2020 0428   LEUKOCYTESUR Negative 05/19/2014 1633    Radiological Exams on Admission: US Abdomen Limited  Result Date: 06/08/2020 CLINICAL DATA:  Intra-abdominal abscess status post percutaneous drainage. Perforated diverticulitis. EXAM: ULTRASOUND ABDOMEN LIMITED COMPARISON:  CT 05/12/2020 FINDINGS: The percutaneous drainage catheter extending through the pannus into the mid abdomen communicates with the largely decompressed collection adjacent to several loops of bowel measuring roughly 2.3 x 2.4 cm in dimension. This is best appreciated on cine images. Within the left lower quadrant, a bland appearing fluid collection possibly representing loculated ascites has now developed, measuring roughly 13.8 x 7.0 cm in greatest dimension. IMPRESSION: Percutaneous drainage catheter communicates with a 2.3 cm collection adjacent to several loops of a bowel. Interval development of a new bland appearing collection within the left lower quadrant separate from the percutaneous drainage catheter possibly representing loculated ascites. This would be better assessed with contrast enhanced CT examination. Electronically Signed   By: Fidela Salisbury MD   On: 06/08/2020 21:58   DG Chest Portable 1 View  Result Date: 06/08/2020 CLINICAL DATA:  Fever EXAM: PORTABLE CHEST 1 VIEW COMPARISON:  November 04, 2019, May 06, 2019 FINDINGS: The cardiomediastinal silhouette is unchanged and enlarged in contour.Removal of RIGHT chest CVC. Elevation of the RIGHT hemidiaphragm. No pleural effusion. No pneumothorax. No acute  pleuroparenchymal abnormality. Visualized abdomen is unremarkable. Multilevel degenerative changes of the thoracic spine. IMPRESSION: 1. No acute cardiopulmonary abnormality. Electronically Signed   By: Valentino Saxon MD  On: 06/08/2020 16:39      Assessment/Plan  ?Fever  pt recently had diverticulitis with diverticular abscess s/p JP drain Repeat abdominal ultrasound today shows "Interval development of a new bland appearing collection within the left lower quadrant separate from the percutaneous drainage catheter possibly representing loculated ascites" - will obtain contrast CT abdomen to further assess  However aside from isolated fever, she has no leukocytosis, lactic acidosis or elevated procalcitonin and no worsening abdominal pain on exam to suggest active infection. Will continue to monitor fever trend and if she has fever with repeat, will cover with empiric antibiotics for intra-abdominal infection  Right stage 2 buttock wound  No signs of infection.  Daily wound care.  Hypokalemia  replete with IV K and repeat labs in the moring   Hx of atrial fibrillation continue amiodarine  HIV continue antiviral therapy  HTN continue amlodipine  Morbid obesity BMI of 41  DVT prophylaxis:.Lovenox Code Status: Full Family Communication: Plan discussed with patient at bedside  disposition Plan: Home with observation Consults called:  Admission status: Observation   Status is: Observation  The patient remains OBS appropriate and will d/c before 2 midnights.  Dispo: The patient is from: SNF              Anticipated d/c is to: TBD              Anticipated d/c date is: 1 day              Patient currently is not medically stable to d/c.         Orene Desanctis DO [facility]   If 7PM-7AM, please contact night-coverage [email or web address]   06/09/2020, 12:15 AM

## 2020-06-09 NOTE — ED Notes (Addendum)
Brief changed, sacral dressing changed, patient repositioned and peri-care completed at this time.

## 2020-06-09 NOTE — ED Notes (Signed)
Complete linen change, peri-care, barrier cream applied, patient repositioned, and sacral wound dressing changed at this time.

## 2020-06-09 NOTE — ED Notes (Signed)
[facility] CALLED  

## 2020-06-09 NOTE — ED Notes (Signed)
Pt with small watery bowel movement. Pt changed by Janace Hoard, EDT, Solmon Ice, EDT, Claiborne Billings RN, and Lilia Pro, Student RN. New sacral pad placed on pt.

## 2020-06-09 NOTE — Consult Note (Addendum)
[facility] Nurse Consult Note: Reason for Consult: Consult requested for JP drain and buttocks.  Pt is familiar to [facility] team from previous admission; refer to consult note on 9/3 and 9/8 Wound type: JP drain is intact to lower abd without leakage around the insertion site or an open wound; no role for topical treatment for this location.  Right buttock with stage 3 pressure injury; 7X7X.2cm, red and moist, painful to the touch, mod amt tan drainage, no odor.   Perineal and buttocks with red moist partial thickness skin loss in multiple patchy areas; appearance is consistent with moisture associated skin damage.  Pt is frequently incontinent of liquid stools and it is difficult to keep sites from becoming soiled related to the close proximity to buttocks/perineaum. Pressure Injury POA: Yes Dressing procedure/placement/frequency: Topical treatment instructions provided for bedside nurses to perform as follows to protect skin, repel moisture, and promote drying and healing: Apply double-folded xeroform gauze to right buttock wound Q day, then cover with foam dressing.  (Change foam dressing Q 3 days or PRN soiling.) Apply Desitin to buttocks/perineal area BID and PRN with each turning and cleaning session. Please re-consult if further assistance is needed.  Thank-you,  Julien Girt MSN, [place], [place], [place], [place]

## 2020-06-09 NOTE — Progress Notes (Signed)
Physical Therapy Treatment Patient Details Name: Melanie Bowen MRN: [id number] DOB: 09-26-1960 Today's Date: 06/09/2020    History of Present Illness Melanie Bowen is a 59 y.o. female with PMH of HTN, diverticulosis, enteric infection, morbid obesity, GERD, COPD, HIV infection, history of stroke, asthma, depression, seizures, and recent ground-level fall at rehab (10/5) status post right humeral head fracture. She presents via EMS from her group facility after routine labs drawn today showed patient to be anemic.  Patient states she has significant muscle soreness in her right arm and decreased range of motion since her fall. She also has a stage 2 sacral wound that developed at the rehab facility    PT Comments    Patient received in bed, states "oh thank goodness" when told her I was PT. She reports she has not been out of the bed at all. Motivated to try today. She requires mod assist for supine to sit. Able to sit independently on side of bed x at least 10 min without external support and performed LE exercises while seated.  She is able to stand briefly with mod +2 assist. Quickly sat back down without warning. Attempted to stand again but was unable to clear bottom.  Cues for positioning for optimal attempt. She will continue to benefit from skilled PT while here to improve LE strength and functional independence.        Follow Up Recommendations  SNF     Equipment Recommendations  Other (comment) (TBD)    Recommendations for Other Services       Precautions / Restrictions Precautions Precautions: Fall Precaution Comments: Acute Rt humeral/scapualr fractures, percutaneous drainage tube abd Restrictions Weight Bearing Restrictions: Yes RUE Weight Bearing: Non weight bearing Other Position/Activity Restrictions: abd drain    Mobility  Bed Mobility Overal bed mobility: Needs Assistance Bed Mobility: Supine to Sit;Sit to Supine;Rolling Rolling: Mod assist   Supine to sit:  Mod assist Sit to supine: Mod assist;+2 for physical assistance   General bed mobility comments: mod assist to raise trunk to seated position on stretcher. She required mod/max +2 to return to supine as she was seated on the edge of the strecher.  Transfers Overall transfer level: Needs assistance Equipment used: Rolling walker (2 wheeled) Transfers: Sit to/from Stand Sit to Stand: Mod assist         General transfer comment: mod assist +2 to come to standing 1x. Cues for foot placement to be able to push up to standing. She attempted another stand, but was unable to clear bottom.  Ambulation/Gait             General Gait Details: deferred/unable   Stairs             Wheelchair Mobility    Modified Rankin (Stroke Patients Only)       Balance Overall balance assessment: Needs assistance Sitting-balance support: Feet supported Sitting balance-Leahy Scale: Good Sitting balance - Comments: She is able to sit on edge of bed x 10 min performing LE exercises no physical assist needed for sitting balance.   Standing balance support: Bilateral upper extremity supported Standing balance-Leahy Scale: Poor Standing balance comment: Stood briefly x 1 rep with mod +2 assist. unable to maintain for long before sitting back down.                            Cognition Arousal/Alertness: Awake/alert Behavior During Therapy: WFL for tasks assessed/performed  General Comments: Patient is A&Ox4, however did not mention she is to be NWB on R UE and sling is not found in room.      Exercises Other Exercises Other Exercises: Seated LAQ, marching while sitting on edge of bed x 20 reps    General Comments        Pertinent Vitals/Pain Pain Assessment: No/denies pain Pain Intervention(s): Monitored during session    Home Living                      Prior Function            PT Goals (current goals  can now be found in the care plan section) Acute Rehab PT Goals Patient Stated Goal: to get stronger and go home PT Goal Formulation: With patient Time For Goal Achievement: 06/20/20 Potential to Achieve Goals: Fair Additional Goals Additional Goal #1: Pt will be modifed independent with all bed mobility and transfers in order to return to PLOF with minimal difficulty. Progress towards PT goals: Progressing toward goals    Frequency    Min 2X/week      PT Plan Current plan remains appropriate    Co-evaluation              AM-PAC PT "6 Clicks" Mobility   Outcome Measure  Help needed turning from your back to your side while in a flat bed without using bedrails?: A Lot Help needed moving from lying on your back to sitting on the side of a flat bed without using bedrails?: A Lot Help needed moving to and from a bed to a chair (including a wheelchair)?: Total Help needed standing up from a chair using your arms (e.g., wheelchair or bedside chair)?: A Lot Help needed to walk in hospital room?: Total Help needed climbing 3-5 steps with a railing? : Total 6 Click Score: 9    End of Session Equipment Utilized During Treatment: Gait belt Activity Tolerance: Patient limited by fatigue Patient left: in bed;with call bell/phone within reach Nurse Communication: Mobility status PT Visit Diagnosis: Unsteadiness on feet (R26.81);Muscle weakness (generalized) (M62.81);Difficulty in walking, not elsewhere classified (R26.2);Other abnormalities of gait and mobility (R26.89);History of falling (Z91.81)     Time: 9604-5409 PT Time Calculation (min) (ACUTE ONLY): 19 min  Charges:  $Therapeutic Activity: 8-22 mins                     Sheniya Garciaperez, PT, GCS 06/09/20,12:42 PM

## 2020-06-09 NOTE — Consult Note (Signed)
[facility] SURGICAL CONSULTATION NOTE (initial) - cpt: 44967   HISTORY OF PRESENT ILLNESS (HPI):  59 y.o. female presented to [facility] ED initially on 10/07 for evaluation of an abnormal lab. 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 She was eventually discharged on 09/24 to [facility] in [place], [place]. 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 She was found to have humeral head fracture. She was discharged with sling, pain control, and outpatient orthopedic follow up. She presented back to the ED on 10/07 after being found to be anemic. She was transfused in the ED and monitored. Patient declined to go back to her SNF facility and [facility] team has been involved in her case. While awaiting placement, patient was found to have a fever to 100.4 yesterday. CBC was drawn and she was found to have new leukocytosis to 13.3K (now resolved). Hgb levels had improved to 8.8. Additionally, she had develops hypokalemia to 2.3 (2.9) and hypomagnesemia to 1.0. She had repeat CT Abdomen/pelvis as well which was concerning for new intra-abdominal fluid collections. She was eventually admitted to the medicine service.   Surgery is consulted by hospitalist physician Dr. Ralene Muskrat, MD in this context for evaluation and management of transient fever, leukocytosis, and intra-abdominal fluid collection in patient with known complicated diverticulitis.   PAST MEDICAL HISTORY (PMH):  Past Medical History:  Diagnosis Date  . Acute GI hemorrhage 12/2015   required transfusion  . Allergic rhinitis   . Arthritis    DDD  . Asthma   . Blepharitis   . Bronchitis   . Cigarette smoker   . Claustrophobia   . COPD (chronic obstructive pulmonary  disease) ([place])   . Cough   . DDD (degenerative disc disease), lumbar 04/10/2014  . Depression   . Diverticulosis   . Ectopic pregnancy   . Eye irritation   . Genital herpes   . GERD (gastroesophageal reflux disease)   . Headache   . HIV (human immunodeficiency virus infection) ([place])   . Hypertension   . Paresthesia of hand, bilateral   . Shortness of breath dyspnea    at times due to asthma     PAST SURGICAL HISTORY [facility]):  Past Surgical History:  Procedure Laterality Date  . BACK SURGERY  12/2014   lumbar lam. Dr. Deri Fuelling  . BACK SURGERY  12/25/2012   Removal lumbosubarachnoid shunt system, L5-S1 TF ESI, Dr. Virl Axe Minchew  . back surgery may 2016     lumbar   . BUNIONECTOMY Bilateral    feet  . COLONOSCOPY  12/06/2014   rescheduled from 11/01/2014 due to poor prep  . DIAGNOSTIC LAPAROSCOPY    . DIALYSIS/PERMA CATHETER INSERTION N/A 04/25/2020   Procedure: DIALYSIS/PERMA CATHETER INSERTION;  Surgeon: Algernon Huxley, MD;  Location: [place] [facility];  Service: Cardiovascular;  Laterality: N/A;  . DIALYSIS/PERMA CATHETER REMOVAL N/A 05/23/2020   Procedure: DIALYSIS/PERMA CATHETER REMOVAL;  Surgeon: Algernon Huxley, MD;  Location: [place] [facility];  Service: Cardiovascular;  Laterality: N/A;  . DISTAL FEMUR OSTEOTOMY Right    Dr. Earnestine Leys  . ESOPHAGOGASTRODUODENOSCOPY Left 01/11/2016   Procedure: ESOPHAGOGASTRODUODENOSCOPY (EGD);  Surgeon: Hulen Luster, MD;  Location: [facility] ENDOSCOPY;  Service: Endoscopy;  Laterality: Left;  . Finger fracture Right    5th finger  .  FRACTURE SURGERY Right 01/25/2014   closed reduction & percutaneous pinning of 5th digit  . HAND SURGERY Bilateral    carpal tunnel  . JOINT REPLACEMENT Bilateral    knees  . KNEE SURGERY Bilateral 2003,2007   total Joint  . LAPAROSCOPY FOR ECTOPIC PREGNANCY    . PERIPHERAL VASCULAR CATHETERIZATION N/A 01/12/2016   Procedure: Visceral Angiography;  Surgeon: Algernon Huxley, MD;  Location: [place] [facility];  Service: Cardiovascular;  Laterality: N/A;  . PERIPHERAL VASCULAR CATHETERIZATION N/A 01/12/2016   Procedure: Visceral Artery Intervention;  Surgeon: Algernon Huxley, MD;  Location: [place] [facility];  Service: Cardiovascular;  Laterality: N/A;  . REVISION TOTAL KNEE ARTHROPLASTY Right 12/31/2002   Dr. Marry Guan  . TUBAL LIGATION    . VIDEO BRONCHOSCOPY N/A 06/30/2015   Procedure: VIDEO BRONCHOSCOPY WITHOUT FLUORO;  Surgeon: Vilinda Boehringer, MD;  Location: [facility] ORS;  Service: Cardiopulmonary;  Laterality: N/A;     MEDICATIONS:  Prior to Admission medications   Medication Sig Start Date End Date Taking? Authorizing Provider  acidophilus (RISAQUAD) CAPS capsule Take 1 capsule by mouth daily. For GI prophylaxis for 7 days 06/04/20 06/10/20 Yes [provider]  albuterol (VENTOLIN HFA) 108 (90 Base) MCG/ACT inhaler Inhale 2 puffs into the lungs every 6 (six) hours as needed for wheezing or shortness of breath. 05/21/19  Yes Laban Emperor, PA-C  ALPRAZolam Duanne Moron) 0.25 MG tablet Take 0.25 mg by mouth every 8 (eight) hours as needed for anxiety. For 7 days 06/04/20 06/10/20 Yes [provider]  amiodarone (PACERONE) 200 MG tablet Take 1 tablet (200 mg total) by mouth daily. 05/24/20  Yes Sreenath, Sudheer B, MD  amLODipine (NORVASC) 10 MG tablet Take 10 mg by mouth daily.    Yes [provider]  ascorbic acid (VITAMIN C) 500 MG tablet Take 500 mg by mouth 2 (two) times daily.   Yes [provider]  DEXILANT 60 MG capsule Take 60 mg by mouth daily.  04/25/19  Yes [provider]  famotidine (PEPCID) 20 MG tablet Take 20 mg by mouth daily.  05/08/19  Yes [provider]  FLUoxetine (PROZAC) 20 MG capsule Take 20 mg by mouth daily.   Yes [provider]  HYDROcodone-acetaminophen (NORCO/VICODIN) 5-325 MG tablet Take 1 tablet by mouth every 6 (six) hours as needed. 06/03/20  Yes Horton, Barbette Hair, MD  methocarbamol (ROBAXIN) 500 MG tablet Take 500  mg by mouth in the morning, at noon, in the evening, and at bedtime. 03/31/20  Yes [provider]  Multiple Vitamin (MULTIVITAMIN WITH MINERALS) TABS tablet Take 1 tablet by mouth daily.   Yes [provider]  torsemide (DEMADEX) 20 MG tablet Take 2 tablets (40 mg total) by mouth daily. 05/24/20  Yes Sreenath, Sudheer B, MD  WIXELA INHUB 250-50 MCG/DOSE AEPB Inhale 1 puff into the lungs 2 (two) times daily. 11/05/19  Yes [provider]  bictegravir-emtricitabine-tenofovir AF (BIKTARVY) 50-200-25 MG TABS tablet Take 1 tablet by mouth daily. 06/06/20   Tsosie Billing, MD  gabapentin (NEURONTIN) 600 MG tablet Take 600 mg by mouth 3 (three) times daily. Patient not taking: Reported on 06/05/2020 04/02/20   [provider]  mirtazapine (REMERON) 15 MG tablet Take 1 tablet (15 mg total) by mouth at bedtime. Patient not taking: Reported on 06/05/2020 05/23/20   Sidney Ace, MD     ALLERGIES:  Allergies  Allergen Reactions  . Acetaminophen Nausea And Vomiting  . Ibuprofen Other (See Comments)  Reports causes bleeding  . Gabapentin Rash  . Morphine Itching and Rash  . Morphine And Related Itching  . Zanaflex  [Tizanidine] Rash     SOCIAL HISTORY:  Social History   Socioeconomic History  . Marital status: Single    Spouse name: Not on file  . Number of children: Not on file  . Years of education: Not on file  . Highest education level: Not on file  Occupational History  . Not on file  Tobacco Use  . Smoking status: Current Some Day Smoker    Packs/day: 0.25    Years: 38.00    Pack years: 9.50    Types: Cigarettes  . Smokeless tobacco: Never Used  . Tobacco comment: 1 cig daily  Vaping Use  . Vaping Use: Never used  Substance and Sexual Activity  . Alcohol use: Yes    Alcohol/week: 5.0 standard drinks    Types: 5 Glasses of wine per week    Comment: occ  . Drug use: Not Currently  . Sexual activity: Not on file  Other Topics Concern   . Not on file  Social History Narrative  . Not on file   Social Determinants of Health   Financial Resource Strain:   . Difficulty of Paying Living Expenses: Not on file  Food Insecurity:   . Worried About [name] fundraiser in the Last Year: Not on file  . Ran Out of Food in the Last Year: Not on file  Transportation Needs:   . Lack of Transportation (Medical): Not on file  . Lack of Transportation (Non-Medical): Not on file  Physical Activity:   . Days of Exercise per Week: Not on file  . Minutes of Exercise per Session: Not on file  Stress:   . Feeling of Stress : Not on file  Social Connections:   . Frequency of Communication with Friends and Family: Not on file  . Frequency of Social Gatherings with Friends and Family: Not on file  . Attends Religious Services: Not on file  . Active Member of Clubs or Organizations: Not on file  . Attends Archivist Meetings: Not on file  . Marital Status: Not on file  Intimate Partner Violence:   . Fear of Current or Ex-Partner: Not on file  . Emotionally Abused: Not on file  . Physically Abused: Not on file  . Sexually Abused: Not on file     FAMILY HISTORY:  Family History  Problem Relation Age of Onset  . Breast cancer Sister       REVIEW OF SYSTEMS:  Review of Systems  Constitutional: Positive for fever (Resolved). Negative for chills.  Respiratory: Negative for cough and shortness of breath.   Cardiovascular: Negative for chest pain and palpitations.  Gastrointestinal: Negative for abdominal pain, constipation, diarrhea, nausea and vomiting.  Genitourinary: Negative for dysuria and urgency.  All other systems reviewed and are negative.   VITAL SIGNS:  Temp:  [98.6 F (37 C)-100.4 F (38 C)] 98.6 F (37 C) (10/11 0000) Pulse Rate:  [86-115] 90 (10/11 0530) Resp:  [12-28] 13 (10/11 0645) BP: (101-136)/(56-74) 112/70 (10/11 0530) SpO2:  [92 %-100 %] 95 % (10/11 0530)     Height: 5\' 6"  (167.6 cm) Weight:  115.7 kg BMI (Calculated): 41.18   INTAKE/OUTPUT:  10/10 0701 - 10/11 0700 In: -  Out: 50 [Drains:50]  PHYSICAL EXAM:  Physical Exam Vitals and nursing note reviewed.  Constitutional:      General: She is not  in acute distress.    Appearance: Normal appearance. She is obese. She is not ill-appearing.  HENT:     Head: Normocephalic and atraumatic.  Eyes:     General: No scleral icterus.    Conjunctiva/sclera: Conjunctivae normal.  Cardiovascular:     Rate and Rhythm: Normal rate and regular rhythm.     Pulses: Normal pulses.  Pulmonary:     Effort: Pulmonary effort is normal. No respiratory distress.     Breath sounds: Normal breath sounds.  Abdominal:     General: There is no distension.     Palpations: Abdomen is soft.     Tenderness: There is no abdominal tenderness. There is no guarding or rebound.    Genitourinary:    Comments: Deferred Musculoskeletal:     Right lower leg: No edema.     Left lower leg: No edema.  Skin:    General: Skin is warm and dry.     Coloration: Skin is not pale.     Findings: No erythema.  Neurological:     General: No focal deficit present.     Mental Status: She is alert and oriented to person, place, and time.  Psychiatric:        Mood and Affect: Mood normal.        Behavior: Behavior normal.      Labs:  CBC Latest Ref Rng & Units 06/09/2020 06/08/2020 06/05/2020  WBC 4.0 - 10.5 K/uL 9.6 9.7 13.3(H)  Hemoglobin 12.0 - 15.0 g/dL 8.0(L) 8.8(L) 6.9(L)  Hematocrit 36 - 46 % 24.5(L) 26.2(L) 20.6(L)  Platelets 150 - 400 K/uL 372 363 243   CMP Latest Ref Rng & Units 06/09/2020 06/08/2020 06/05/2020  Glucose 70 - 99 mg/dL 67(L) 80 63(L)  BUN 6 - 20 mg/dL 21(H) 22(H) 30(H)  Creatinine 0.44 - 1.00 mg/dL 1.10(H) 1.10(H) 0.92  Sodium 135 - 145 mmol/L 136 138 137  Potassium 3.5 - 5.1 mmol/L 2.9(L) 2.3(LL) 2.9(L)  Chloride 98 - 111 mmol/L 102 102 102  CO2 22 - 32 mmol/L 24 26 22   Calcium 8.9 - 10.3 mg/dL 6.5(L) 6.6(L) 7.2(L)  Total  Protein 6.5 - 8.1 g/dL - - 5.9(L)  Total Bilirubin 0.3 - 1.2 mg/dL - - 1.0  Alkaline Phos 38 - 126 U/L - - 96  AST 15 - 41 U/L - - 27  ALT 0 - 44 U/L - - 19     Imaging studies:   CT Abdomen/Pelvis (06/09/2020) personally reviewed showing intra-abdominal fluid, most likely ascites, and previous drain in place with decrease in size from prior, and radiologist report reviewed: IMPRESSION: 1. Unchanged positioning of the previously demonstrated percutaneous strain. There is a persistent small air and fluid collection which has improved since the prior study. There is a small amount of extraluminal oral contrast adjacent to the drain consistent with a persistent perforation or fistula. 2. Organizing fluid collections in the abdomen concerning for infected ascites/developing abscesses. 3. Persistent extraluminal oral contrast in the upper abdomen, not substantially changed from prior study. 4. Sigmoid diverticulosis with persistent findings of diverticulitis. There is apparent wall thickening at the splenic flexure of the colon which may represent an additional area of infectious or inflammatory colitis. At this location, ischemic colitis is not excluded. 5. Additional chronic findings as detailed above. 6.  Aortic Atherosclerosis (ICD10-I70.0).   Assessment/Plan: (ICD-10's: R18.8) 59 y.o. 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    - Discussed case with IR MD, fluid thought to most likely be ascites. Given her resolution in leukocytosis, no longer with fevers, and no pain with abdominal examination, I think it would be reasonable to hold off on any percutaneous aspiration or drainage at this time. Should her fever return, leukocytosis worsen, or clinical condition worsen then we can pursue this.   - Okay for  diet as tolerates  - Continue LLQ drain  - No role for ABx at this time   - Further management per primary service; we will be available if needed  All of the above findings and recommendations were discussed with the patient, and all of patient's questions were answered to her expressed satisfaction.  Thank you for the opportunity to participate in this patient's care.   -- Edison Simon, PA-C [facility] 06/09/2020, 7:53 AM [phone] M-F: 7am - 4pm

## 2020-06-09 NOTE — ED Notes (Signed)
Patient transported to CT 

## 2020-06-09 NOTE — ED Notes (Addendum)
Partial linen change, diaper change, peri care complete, patient repositioned and sacral dressing changed at this time.

## 2020-06-10 ENCOUNTER — Other Ambulatory Visit: Payer: Self-pay

## 2020-06-10 ENCOUNTER — Observation Stay
Admission: EM | Admit: 2020-06-10 | Discharge: 2020-06-12 | Disposition: A | Discharge status: Home / Self Care | Payer: Medicare Other | Attending: Internal Medicine | Admitting: Internal Medicine

## 2020-06-10 DIAGNOSIS — Z20822 Contact with and (suspected) exposure to covid-19: Secondary | ICD-10-CM | POA: Diagnosis not present

## 2020-06-10 DIAGNOSIS — I110 Hypertensive heart disease with heart failure: Secondary | ICD-10-CM | POA: Diagnosis not present

## 2020-06-10 DIAGNOSIS — F17210 Nicotine dependence, cigarettes, uncomplicated: Secondary | ICD-10-CM | POA: Insufficient documentation

## 2020-06-10 DIAGNOSIS — J45909 Unspecified asthma, uncomplicated: Secondary | ICD-10-CM | POA: Diagnosis not present

## 2020-06-10 DIAGNOSIS — K5900 Constipation, unspecified: Secondary | ICD-10-CM | POA: Insufficient documentation

## 2020-06-10 DIAGNOSIS — Z5989 Other problems related to housing and economic circumstances: Secondary | ICD-10-CM

## 2020-06-10 DIAGNOSIS — R531 Weakness: Principal | ICD-10-CM | POA: Insufficient documentation

## 2020-06-10 DIAGNOSIS — E876 Hypokalemia: Secondary | ICD-10-CM

## 2020-06-10 DIAGNOSIS — Z79899 Other long term (current) drug therapy: Secondary | ICD-10-CM | POA: Insufficient documentation

## 2020-06-10 DIAGNOSIS — Z515 Encounter for palliative care: Secondary | ICD-10-CM

## 2020-06-10 DIAGNOSIS — Z96651 Presence of right artificial knee joint: Secondary | ICD-10-CM | POA: Diagnosis not present

## 2020-06-10 DIAGNOSIS — I5032 Chronic diastolic (congestive) heart failure: Secondary | ICD-10-CM | POA: Insufficient documentation

## 2020-06-10 DIAGNOSIS — J449 Chronic obstructive pulmonary disease, unspecified: Secondary | ICD-10-CM | POA: Insufficient documentation

## 2020-06-10 DIAGNOSIS — D649 Anemia, unspecified: Secondary | ICD-10-CM | POA: Insufficient documentation

## 2020-06-10 LAB — BASIC METABOLIC PANEL
Anion gap: 10 (ref 5–15)
BUN: 20 mg/dL (ref 6–20)
CO2: 23 mmol/L (ref 22–32)
Calcium: 6.8 mg/dL — ABNORMAL LOW (ref 8.9–10.3)
Chloride: 102 mmol/L (ref 98–111)
Creatinine, Ser: 1.1 mg/dL — ABNORMAL HIGH (ref 0.44–1.00)
GFR, Estimated: 55 mL/min — ABNORMAL LOW (ref >60)
Glucose, Bld: 69 mg/dL — ABNORMAL LOW (ref 70–99)
Potassium: 2.8 mmol/L — ABNORMAL LOW (ref 3.5–5.1)
Sodium: 135 mmol/L (ref 135–145)

## 2020-06-10 LAB — CBC
HCT: 26.4 % — ABNORMAL LOW (ref 36.0–46.0)
Hemoglobin: 8.6 g/dL — ABNORMAL LOW (ref 12.0–15.0)
MCH: 29.2 pg (ref 26.0–34.0)
MCHC: 32.6 g/dL (ref 30.0–36.0)
MCV: 89.5 fL (ref 80.0–100.0)
Platelets: 438 10*3/uL — ABNORMAL HIGH (ref 150–400)
RBC: 2.95 MIL/uL — ABNORMAL LOW (ref 3.87–5.11)
RDW: 20.4 % — ABNORMAL HIGH (ref 11.5–15.5)
WBC: 9.4 10*3/uL (ref 4.0–10.5)
nRBC: 0 % (ref 0.0–0.2)

## 2020-06-10 LAB — RESPIRATORY PANEL BY RT PCR (FLU A&B, COVID)
Influenza A by PCR: NEGATIVE
Influenza B by PCR: NEGATIVE
SARS Coronavirus 2 by RT PCR: NEGATIVE

## 2020-06-10 LAB — MAGNESIUM: Magnesium: 1.4 mg/dL — ABNORMAL LOW (ref 1.7–2.4)

## 2020-06-10 MED ORDER — POTASSIUM CHLORIDE CRYS ER 20 MEQ PO TBCR
40.0000 meq | EXTENDED_RELEASE_TABLET | Freq: Once | ORAL | Status: AC
  Administered 2020-06-10: 40 meq via ORAL
  Filled 2020-06-10: qty 2

## 2020-06-10 MED ORDER — MAGNESIUM SULFATE 2 GM/50ML IV SOLN: 2.0000 g | Freq: Once | INTRAVENOUS | Status: DC

## 2020-06-10 MED ORDER — MAGNESIUM SULFATE 2 GM/50ML IV SOLN
2.0000 g | Freq: Once | INTRAVENOUS | Status: AC
  Administered 2020-06-10: 2 g via INTRAVENOUS
  Filled 2020-06-10: qty 50

## 2020-06-10 NOTE — ED Notes (Signed)
Pt alert and oriented, continues to be on cell phone for entire assessment attempt.

## 2020-06-10 NOTE — ED Notes (Signed)
Pt arrives via ems from home ems reports pt d/c from [facility] yesterday where she was seen for low k+ and low mag levels, pt was insisting to go home upon leaving but when she got home unable to care for self. Pt has stage 3 decub bottom. Ems states no home health or assistance set up at home. Pt unable to walk, and would like a social work consult to be placed in a facility.   Ems vitals   HR-106 BP- 99/55 02-100%

## 2020-06-10 NOTE — ED Notes (Signed)
Pt provided with PO fluids at bedside, per request.

## 2020-06-10 NOTE — ED Notes (Signed)
LLQ JP drain noted, draining brown, thin liquid

## 2020-06-10 NOTE — ED Notes (Signed)
Pt had [employer] delivered to ED, eating in stretcher

## 2020-06-10 NOTE — ED Notes (Signed)
Pt has mepilex to right buttocks, right buttocks and sacrum have open areas or redness noted

## 2020-06-10 NOTE — ED Provider Notes (Signed)
[facility] Emergency Department Provider Note  ____________________________________________   I have reviewed the triage vital signs and the nursing notes.   HISTORY  Chief Complaint Fatigue and social work consult for placement   History limited by: Not Limited   HPI Melanie Bowen is a 59 y.o. female who presents to the emergency department today because of desire to be placed in skilled nursing facility. Patient was discharged yesterday from the hospital. SNF was recommended at that time however patient declined. She states that she made a mistake and is not able to care for herself at home.   Records reviewed. Per medical record review patient has a history of recent admission with discharge yesterday.   Past Medical History:  Diagnosis Date  . Acute GI hemorrhage 12/2015   required transfusion  . Allergic rhinitis   . Arthritis    DDD  . Asthma   . Blepharitis   . Bronchitis   . Cigarette smoker   . Claustrophobia   . COPD (chronic obstructive pulmonary disease) ([place])   . Cough   . DDD (degenerative disc disease), lumbar 04/10/2014  . Depression   . Diverticulosis   . Ectopic pregnancy   . Eye irritation   . Genital herpes   . GERD (gastroesophageal reflux disease)   . Headache   . HIV (human immunodeficiency virus infection) ([place])   . Hypertension   . Paresthesia of hand, bilateral   . Shortness of breath dyspnea    at times due to asthma    Patient Active Problem List   Diagnosis Date Noted  . Fever 06/09/2020  . Unspecified atrial fibrillation ([place]) 06/09/2020  . Hypokalemia 06/08/2020  . Pressure injury of skin 05/08/2020  . Goals of care, counseling/discussion   . Dyspnea   . Acute renal failure (ARF) ([place]) 04/16/2020  . Severe sepsis with septic shock ([place]) 04/16/2020  . Hypotension 04/16/2020  . Diverticulosis 04/16/2020  . Acute respiratory distress   . DNR (do not resuscitate) discussion   . Palliative care by  specialist   . Abscess of sigmoid colon due to diverticulitis 04/14/2020  . Multifocal pneumonia 11/06/2019  . AKI (acute kidney injury) ([place]) 11/06/2019  . Leukocytosis 11/06/2019  . Diverticulitis of large intestine with abscess without bleeding 01/17/2019  . Morbid (severe) obesity due to excess calories ([place]) 11/26/2018  . Violation of controlled substance agreement 12/05/2017  . Positive urine drug screen (+cocaine) 12/05/2017  . Cervicalgia 12/05/2017  . Drug-seeking behavior 12/05/2017  . Community acquired pneumonia 11/28/2016  . Pneumonia 11/28/2016  . Allergic rhinitis 05/27/2016  . Tobacco abuse counseling 02/11/2016  . Symptomatic anemia   . Lower GI bleed   . Anemia 01/16/2016  . GI bleed 01/10/2016  . GERD (gastroesophageal reflux disease) 01/10/2016  . HTN (hypertension) 01/10/2016  . Depression 01/10/2016  . HIV (human immunodeficiency virus infection) ([place]) 01/10/2016  . Lumbar pseudoarthrosis 10/31/2015  . Chronic cough   . Cough 06/19/2015  . DDD (degenerative disc disease), lumbar 12/31/2014  . Degenerative disc disease, lumbar 12/31/2014  . Asthma, chronic 11/12/2014    Past Surgical History:  Procedure Laterality Date  . BACK SURGERY  12/2014   lumbar lam. Dr. Deri Fuelling  . BACK SURGERY  12/25/2012   Removal lumbosubarachnoid shunt system, L5-S1 TF ESI, Dr. Virl Axe Minchew  . back surgery may 2016     lumbar   . BUNIONECTOMY Bilateral    feet  . COLONOSCOPY  12/06/2014   rescheduled from 11/01/2014  due to poor prep  . DIAGNOSTIC LAPAROSCOPY    . DIALYSIS/PERMA CATHETER INSERTION N/A 04/25/2020   Procedure: DIALYSIS/PERMA CATHETER INSERTION;  Surgeon: Algernon Huxley, MD;  Location: [place] [facility];  Service: Cardiovascular;  Laterality: N/A;  . DIALYSIS/PERMA CATHETER REMOVAL N/A 05/23/2020   Procedure: DIALYSIS/PERMA CATHETER REMOVAL;  Surgeon: Algernon Huxley, MD;  Location: [place] [facility];  Service: Cardiovascular;  Laterality: N/A;  .  DISTAL FEMUR OSTEOTOMY Right    Dr. Earnestine Leys  . ESOPHAGOGASTRODUODENOSCOPY Left 01/11/2016   Procedure: ESOPHAGOGASTRODUODENOSCOPY (EGD);  Surgeon: Hulen Luster, MD;  Location: [facility];  Service: Endoscopy;  Laterality: Left;  . Finger fracture Right    5th finger  . FRACTURE SURGERY Right 01/25/2014   closed reduction & percutaneous pinning of 5th digit  . HAND SURGERY Bilateral    carpal tunnel  . JOINT REPLACEMENT Bilateral    knees  . KNEE SURGERY Bilateral 2003,2007   total Joint  . LAPAROSCOPY FOR ECTOPIC PREGNANCY    . PERIPHERAL VASCULAR CATHETERIZATION N/A 01/12/2016   Procedure: Visceral Angiography;  Surgeon: Algernon Huxley, MD;  Location: [place] [facility];  Service: Cardiovascular;  Laterality: N/A;  . PERIPHERAL VASCULAR CATHETERIZATION N/A 01/12/2016   Procedure: Visceral Artery Intervention;  Surgeon: Algernon Huxley, MD;  Location: [place] [facility];  Service: Cardiovascular;  Laterality: N/A;  . REVISION TOTAL KNEE ARTHROPLASTY Right 12/31/2002   Dr. Marry Guan  . TUBAL LIGATION    . VIDEO BRONCHOSCOPY N/A 06/30/2015   Procedure: VIDEO BRONCHOSCOPY WITHOUT FLUORO;  Surgeon: Vilinda Boehringer, MD;  Location: [facility] ORS;  Service: Cardiopulmonary;  Laterality: N/A;    Prior to Admission medications   Medication Sig Start Date End Date Taking? Authorizing Provider  dolutegravir (TIVICAY) 50 MG tablet Take 1 tablet by mouth daily.   Yes [provider]  Lactobacillus (PROBIOTIC ACIDOPHILUS) CAPS Take 1 capsule by mouth daily.   Yes [provider]  rilpivirine (EDURANT) 25 MG TABS tablet Take 25 mg by mouth daily with breakfast.   Yes [provider]  acidophilus (RISAQUAD) CAPS capsule Take 1 capsule by mouth daily. For GI prophylaxis for 7 days 06/04/20 06/10/20  [provider]  albuterol (VENTOLIN HFA) 108 (90 Base) MCG/ACT inhaler Inhale 2 puffs into the lungs every 6 (six) hours as needed for wheezing or shortness of breath. 05/21/19    Laban Emperor, PA-C  ALPRAZolam Duanne Moron) 0.25 MG tablet Take 0.25 mg by mouth every 8 (eight) hours as needed for anxiety.  06/04/20 06/10/20  [provider]  amiodarone (PACERONE) 200 MG tablet Take 1 tablet (200 mg total) by mouth daily. 05/24/20   Ralene Muskrat B, MD  amLODipine (NORVASC) 10 MG tablet Take 10 mg by mouth daily.     [provider]  ascorbic acid (VITAMIN C) 500 MG tablet Take 500 mg by mouth 2 (two) times daily.    [provider]  bictegravir-emtricitabine-tenofovir AF (BIKTARVY) 50-200-25 MG TABS tablet Take 1 tablet by mouth daily. Patient not taking: Reported on 06/10/2020 06/06/20   Tsosie Billing, MD  DEXILANT 60 MG capsule Take 60 mg by mouth daily.  04/25/19   [provider]  famotidine (PEPCID) 20 MG tablet Take 20 mg by mouth daily.  05/08/19   [provider]  FLUoxetine (PROZAC) 20 MG capsule Take 20 mg by mouth daily.    [provider]  HYDROcodone-acetaminophen (NORCO/VICODIN) 5-325 MG tablet Take 1 tablet by mouth every 6 (six) hours as needed.  06/03/20   Horton, Barbette Hair, MD  methocarbamol (ROBAXIN) 500 MG tablet Take 500 mg by mouth in the morning, at noon, in the evening, and at bedtime. 03/31/20   [provider]  mirtazapine (REMERON) 15 MG tablet Take 1 tablet (15 mg total) by mouth at bedtime. Patient not taking: Reported on 06/05/2020 05/23/20   Sidney Ace, MD  Multiple Vitamin (MULTIVITAMIN WITH MINERALS) TABS tablet Take 1 tablet by mouth daily.    [provider]  torsemide (DEMADEX) 20 MG tablet Take 2 tablets (40 mg total) by mouth daily. 05/24/20   Sreenath, Sudheer B, MD  WIXELA INHUB 250-50 MCG/DOSE AEPB Inhale 1 puff into the lungs 2 (two) times daily. 11/05/19   [provider]    Allergies Acetaminophen, Ibuprofen, Gabapentin, Morphine, Morphine and related, and Zanaflex  [tizanidine]  Family History  Problem Relation Age of Onset  . Breast cancer  Sister     Social History Social History   Tobacco Use  . Smoking status: Current Some Day Smoker    Packs/day: 0.25    Years: 38.00    Pack years: 9.50    Types: Cigarettes  . Smokeless tobacco: Never Used  . Tobacco comment: 1 cig daily  Vaping Use  . Vaping Use: Never used  Substance Use Topics  . Alcohol use: Yes    Alcohol/week: 5.0 standard drinks    Types: 5 Glasses of wine per week    Comment: occ  . Drug use: Not Currently    Review of Systems Constitutional: No fever/chills Eyes: No visual changes. ENT: No sore throat. Cardiovascular: Denies chest pain. Respiratory: Denies shortness of breath. Gastrointestinal: No abdominal pain.  No nausea, no vomiting.  No diarrhea.   Genitourinary: Negative for dysuria. Musculoskeletal: Negative for back pain. Skin: Positive for sores Neurological: Negative for headaches, focal weakness or numbness.  ____________________________________________   PHYSICAL EXAM:  VITAL SIGNS: ED Triage Vitals  Enc Vitals Group     BP 06/10/20 1415 (!) 102/56     Pulse Rate 06/10/20 1415 94     Resp 06/10/20 1415 16     Temp 06/10/20 1415 98.3 F (36.8 C)     Temp Source 06/10/20 1415 Oral     SpO2 06/10/20 1415 98 %     Weight 06/10/20 1420 253 lb 8.5 oz (115 kg)     Height 06/10/20 1420 5\' 6"  (1.676 m)     Head Circumference --      Peak Flow --      Pain Score 06/10/20 1419 10   Constitutional: Alert and oriented.  Eyes: Conjunctivae are normal.  ENT      Head: Normocephalic and atraumatic.      Nose: No congestion/rhinnorhea.      Mouth/Throat: Mucous membranes are moist.      Neck: No stridor. Hematological/Lymphatic/Immunilogical: No cervical lymphadenopathy. Cardiovascular: Normal rate, regular rhythm.  No murmurs, rubs, or gallops.  Respiratory: Normal respiratory effort without tachypnea nor retractions. Breath sounds are clear and equal bilaterally. No wheezes/rales/rhonchi. Gastrointestinal: Soft and non tender.  No rebound. No guarding.  Genitourinary: Deferred Musculoskeletal: Normal range of motion in all extremities. No lower extremity edema. Neurologic:  Normal speech and language. No gross focal neurologic deficits are appreciated.  Skin:  Skin is warm, dry and intact. No rash noted. Psychiatric: Mood and affect are normal. Speech and behavior are normal. Patient exhibits appropriate insight and judgment.  ____________________________________________    LABS (pertinent positives/negatives)  BMP na 135, k  2.8, cl 102, glu 69, cr 1.10, ca 6.8 CBC wbc 9.4, hgb 8.6, plt 438 COVID negative Magnesium 1.4  ____________________________________________   EKG  I, Nance Pear, attending physician, personally viewed and interpreted this EKG  EKG Time: 1439 Rate: 97 Rhythm: normal sinus rhythm Axis: normal Intervals: qtc 482 QRS: narrow, q waves v1, v2, v3 ST changes: no st elevation Impression: abnormal ekg ____________________________________________    RADIOLOGY  None  ____________________________________________   PROCEDURES  Procedures  ____________________________________________   INITIAL IMPRESSION / ASSESSMENT AND PLAN / ED COURSE  Pertinent labs & imaging results that were available during my care of the patient were reviewed by me and considered in my medical decision making (see chart for details).   Patient presents to the emergency department today because of concerns for inability to take care of herself at home.  Patient was discharged from the hospital yesterday and skilled nursing facility was recommended at that time however patient declined.  Will plan on having social work evaluate.  Patient's blood work did  shows reveal slight hypokalemia and hypomagnesemia.  Patient will be given repletion in the emergency department.  Will recheck in the morning.  ___________________________________________   FINAL CLINICAL IMPRESSION(S) / ED DIAGNOSES  Final  diagnoses:  Living accommodation issues  Hypokalemia     Note: This dictation was prepared with Dragon dictation. Any transcriptional errors that result from this process are unintentional     Nance Pear, MD 06/10/20 2032

## 2020-06-10 NOTE — ED Notes (Addendum)
Pillows placed under both hips. Med admin delayed due to lack of IV access, consult placed to IV team

## 2020-06-10 NOTE — ED Notes (Signed)
Pt urinated on herself, linens and diaper changed

## 2020-06-10 NOTE — ED Triage Notes (Signed)
Please see previous note made by this RN. NAD noted at this time

## 2020-06-10 NOTE — ED Notes (Signed)
Pt brief changed, clean brief provided, peri care

## 2020-06-11 DIAGNOSIS — R531 Weakness: Secondary | ICD-10-CM | POA: Diagnosis not present

## 2020-06-11 LAB — HEPATIC FUNCTION PANEL
ALT: 13 U/L (ref 0–44)
AST: 25 U/L (ref 15–41)
Albumin: 1.2 g/dL — ABNORMAL LOW (ref 3.5–5.0)
Alkaline Phosphatase: 99 U/L (ref 38–126)
Bilirubin, Direct: 0.2 mg/dL (ref 0.0–0.2)
Indirect Bilirubin: 0.7 mg/dL (ref 0.3–0.9)
Total Bilirubin: 0.9 mg/dL (ref 0.3–1.2)
Total Protein: 5.5 g/dL — ABNORMAL LOW (ref 6.5–8.1)

## 2020-06-11 LAB — BASIC METABOLIC PANEL
Anion gap: 10 (ref 5–15)
BUN: 18 mg/dL (ref 6–20)
CO2: 24 mmol/L (ref 22–32)
Calcium: 6.9 mg/dL — ABNORMAL LOW (ref 8.9–10.3)
Chloride: 101 mmol/L (ref 98–111)
Creatinine, Ser: 0.96 mg/dL (ref 0.44–1.00)
GFR, Estimated: >60 mL/min (ref >60)
Glucose, Bld: 68 mg/dL — ABNORMAL LOW (ref 70–99)
Potassium: 2.6 mmol/L — CL (ref 3.5–5.1)
Sodium: 135 mmol/L (ref 135–145)

## 2020-06-11 LAB — LIPASE, BLOOD: Lipase: 26 U/L (ref 11–51)

## 2020-06-11 LAB — GLUCOSE, CAPILLARY: Glucose-Capillary: 56 mg/dL — ABNORMAL LOW (ref 70–99)

## 2020-06-11 LAB — MAGNESIUM: Magnesium: 1.6 mg/dL — ABNORMAL LOW (ref 1.7–2.4)

## 2020-06-11 LAB — LACTIC ACID, PLASMA: Lactic Acid, Venous: 1.2 mmol/L (ref 0.5–1.9)

## 2020-06-11 MED ORDER — ENSURE ENLIVE PO LIQD: 237.0000 mL | Freq: Two times a day (BID) | ORAL | Status: DC

## 2020-06-11 MED ORDER — POLYETHYLENE GLYCOL 3350 17 G PO PACK: 17.0000 g | PACK | Freq: Every day | ORAL | Status: DC | PRN

## 2020-06-11 MED ORDER — MAGNESIUM OXIDE 400 (241.3 MG) MG PO TABS
800.0000 mg | ORAL_TABLET | Freq: Once | ORAL | Status: AC
  Administered 2020-06-11: 800 mg via ORAL
  Filled 2020-06-11: qty 2

## 2020-06-11 MED ORDER — ACETAMINOPHEN 325 MG PO TABS: 650.0000 mg | ORAL_TABLET | Freq: Four times a day (QID) | ORAL | Status: DC | PRN

## 2020-06-11 MED ORDER — CALCIUM GLUCONATE-NACL 1-0.675 GM/50ML-% IV SOLN: 1.0000 g | Freq: Once | INTRAVENOUS | Status: DC

## 2020-06-11 MED ORDER — ALPRAZOLAM 0.5 MG PO TABS: 0.2500 mg | ORAL_TABLET | Freq: Three times a day (TID) | ORAL | Status: DC | PRN

## 2020-06-11 MED ORDER — POTASSIUM CHLORIDE 10 MEQ/100ML IV SOLN
10.0000 meq | INTRAVENOUS | Status: DC
  Filled 2020-06-11: qty 100

## 2020-06-11 MED ORDER — SODIUM CHLORIDE 0.9 % IV SOLN: 250.0000 mL | INTRAVENOUS | Status: DC | PRN

## 2020-06-11 MED ORDER — SODIUM CHLORIDE 0.9% FLUSH
3.0000 mL | Freq: Two times a day (BID) | INTRAVENOUS | Status: DC
  Administered 2020-06-11: 3 mL via INTRAVENOUS

## 2020-06-11 MED ORDER — CALCIUM CARBONATE ANTACID 500 MG PO CHEW
800.0000 mg | CHEWABLE_TABLET | Freq: Once | ORAL | Status: AC
  Administered 2020-06-11: 800 mg via ORAL
  Filled 2020-06-11: qty 4

## 2020-06-11 MED ORDER — SENNOSIDES-DOCUSATE SODIUM 8.6-50 MG PO TABS
2.0000 | ORAL_TABLET | Freq: Two times a day (BID) | ORAL | Status: DC
  Administered 2020-06-11: 2 via ORAL
  Filled 2020-06-11 (×2): qty 2

## 2020-06-11 MED ORDER — PANTOPRAZOLE SODIUM 40 MG PO TBEC
40.0000 mg | DELAYED_RELEASE_TABLET | Freq: Every day | ORAL | Status: DC
  Administered 2020-06-11 – 2020-06-12 (×2): 40 mg via ORAL
  Filled 2020-06-11 (×2): qty 1

## 2020-06-11 MED ORDER — POTASSIUM CHLORIDE CRYS ER 20 MEQ PO TBCR
40.0000 meq | EXTENDED_RELEASE_TABLET | Freq: Two times a day (BID) | ORAL | Status: DC
  Administered 2020-06-11 – 2020-06-12 (×2): 40 meq via ORAL
  Filled 2020-06-11 (×3): qty 2

## 2020-06-11 MED ORDER — MAGNESIUM SULFATE 2 GM/50ML IV SOLN
2.0000 g | Freq: Once | INTRAVENOUS | Status: AC
  Administered 2020-06-11: 2 g via INTRAVENOUS
  Filled 2020-06-11: qty 50

## 2020-06-11 MED ORDER — LACTATED RINGERS IV BOLUS
1000.0000 mL | Freq: Once | INTRAVENOUS | Status: AC
  Administered 2020-06-11: 1000 mL via INTRAVENOUS

## 2020-06-11 MED ORDER — DOLUTEGRAVIR SODIUM 50 MG PO TABS
50.0000 mg | ORAL_TABLET | Freq: Every day | ORAL | Status: DC
  Administered 2020-06-11 – 2020-06-12 (×2): 50 mg via ORAL
  Filled 2020-06-11 (×2): qty 1

## 2020-06-11 MED ORDER — POTASSIUM CHLORIDE CRYS ER 20 MEQ PO TBCR
80.0000 meq | EXTENDED_RELEASE_TABLET | Freq: Once | ORAL | Status: AC
  Administered 2020-06-11: 80 meq via ORAL
  Filled 2020-06-11: qty 4

## 2020-06-11 MED ORDER — MOMETASONE FURO-FORMOTEROL FUM 200-5 MCG/ACT IN AERO
2.0000 | INHALATION_SPRAY | Freq: Two times a day (BID) | RESPIRATORY_TRACT | Status: DC
  Administered 2020-06-12: 2 via RESPIRATORY_TRACT
  Filled 2020-06-11 (×2): qty 8.8

## 2020-06-11 MED ORDER — FLUOXETINE HCL 20 MG PO CAPS
20.0000 mg | ORAL_CAPSULE | Freq: Every day | ORAL | Status: DC
  Administered 2020-06-11 – 2020-06-12 (×2): 20 mg via ORAL
  Filled 2020-06-11 (×2): qty 1

## 2020-06-11 MED ORDER — KCL IN DEXTROSE-NACL 20-5-0.45 MEQ/L-%-% IV SOLN
Freq: Once | INTRAVENOUS | Status: AC
  Filled 2020-06-11: qty 1000

## 2020-06-11 MED ORDER — MAGNESIUM SULFATE 2 GM/50ML IV SOLN
2.0000 g | Freq: Once | INTRAVENOUS | Status: DC
  Filled 2020-06-11: qty 50

## 2020-06-11 MED ORDER — ASCORBIC ACID 500 MG PO TABS
500.0000 mg | ORAL_TABLET | Freq: Two times a day (BID) | ORAL | Status: DC
  Administered 2020-06-12 (×2): 500 mg via ORAL
  Filled 2020-06-11 (×2): qty 1

## 2020-06-11 MED ORDER — AMIODARONE HCL 200 MG PO TABS
200.0000 mg | ORAL_TABLET | Freq: Every day | ORAL | Status: DC
  Administered 2020-06-11 – 2020-06-12 (×2): 200 mg via ORAL
  Filled 2020-06-11 (×2): qty 1

## 2020-06-11 MED ORDER — ONDANSETRON HCL 4 MG/2ML IJ SOLN
4.0000 mg | Freq: Four times a day (QID) | INTRAMUSCULAR | Status: DC | PRN
  Administered 2020-06-12: 4 mg via INTRAVENOUS
  Filled 2020-06-11: qty 2

## 2020-06-11 MED ORDER — SODIUM CHLORIDE 0.9% FLUSH: 3.0000 mL | INTRAVENOUS | Status: DC | PRN

## 2020-06-11 MED ORDER — RILPIVIRINE HCL 25 MG PO TABS
25.0000 mg | ORAL_TABLET | Freq: Every day | ORAL | Status: DC
  Administered 2020-06-12: 25 mg via ORAL
  Filled 2020-06-11 (×2): qty 1

## 2020-06-11 NOTE — ED Notes (Signed)
Unable to collect blood draws.  Lab called and informed.  Lab to send someone to draw labs.

## 2020-06-11 NOTE — ED Notes (Signed)
Date and time results received: 06/11/20 12:19  Test: potassium Critical Value: 2.6  Name of Provider Notified: Tamala Julian, MD  Orders Received? Or Actions Taken?: Orders Received - See Orders for details

## 2020-06-11 NOTE — NC FL2 (Signed)
[place] LEVEL OF CARE SCREENING TOOL     IDENTIFICATION  Patient Name: Melanie Bowen Birthdate: November 22, 1960 Sex: female Admission Date (Current Location): 06/10/2020  [place] and [place] Number:  Engineering geologist and Address:  [facility], [address], [place], [place] [address]      Provider Number: [id number]  Attending Physician Name and Address:  No att. providers found  Relative Name and Phone Number:  Tammy/sister [phone]    Current Level of Care: Hospital Recommended Level of Care: [place] Prior Approval Number:    Date Approved/Denied:   PASRR Number: [id number]  Discharge Plan: SNF    Current Diagnoses: Patient Active Problem List   Diagnosis Date Noted  . Fever 06/09/2020  . Unspecified atrial fibrillation ([place]) 06/09/2020  . Hypokalemia 06/08/2020  . Pressure injury of skin 05/08/2020  . Goals of care, counseling/discussion   . Dyspnea   . Acute renal failure (ARF) ([place]) 04/16/2020  . Severe sepsis with septic shock ([place]) 04/16/2020  . Hypotension 04/16/2020  . Diverticulosis 04/16/2020  . Acute respiratory distress   . DNR (do not resuscitate) discussion   . Palliative care by specialist   . Abscess of sigmoid colon due to diverticulitis 04/14/2020  . Multifocal pneumonia 11/06/2019  . AKI (acute kidney injury) ([place]) 11/06/2019  . Leukocytosis 11/06/2019  . Diverticulitis of large intestine with abscess without bleeding 01/17/2019  . Morbid (severe) obesity due to excess calories ([place]) 11/26/2018  . Violation of controlled substance agreement 12/05/2017  . Positive urine drug screen (+cocaine) 12/05/2017  . Cervicalgia 12/05/2017  . Drug-seeking behavior 12/05/2017  . Community acquired pneumonia 11/28/2016  . Pneumonia 11/28/2016  . Allergic rhinitis 05/27/2016  . Tobacco abuse counseling 02/11/2016  . Symptomatic anemia   . Lower GI bleed   . Anemia 01/16/2016  . GI  bleed 01/10/2016  . GERD (gastroesophageal reflux disease) 01/10/2016  . HTN (hypertension) 01/10/2016  . Depression 01/10/2016  . HIV (human immunodeficiency virus infection) ([place]) 01/10/2016  . Lumbar pseudoarthrosis 10/31/2015  . Chronic cough   . Cough 06/19/2015  . DDD (degenerative disc disease), lumbar 12/31/2014  . Degenerative disc disease, lumbar 12/31/2014  . Asthma, chronic 11/12/2014    Orientation RESPIRATION BLADDER Height & Weight     Self, Situation, Place  O2 (2LNC) Incontinent, External catheter Weight: 115 kg Height:  5\' 6"  (167.6 cm)  BEHAVIORAL SYMPTOMS/MOOD NEUROLOGICAL BOWEL NUTRITION STATUS   (NONE)   Incontinent Diet (Dysphagia 3)  AMBULATORY STATUS COMMUNICATION OF NEEDS Skin   Extensive Assist Verbally Skin abrasions, Other (Comment), Bruising, PU Stage and Appropriate Care   PU Stage 2 Dressing:  (Right and Left Buttocks; foam prn)                   Personal Care Assistance Level of Assistance  Bathing, Feeding, Dressing Bathing Assistance: Maximum assistance Feeding assistance: Maximum assistance Dressing Assistance: Maximum assistance     Functional Limitations Info  Sight, Hearing, Speech Sight Info: Adequate Hearing Info: Adequate Speech Info: Adequate    SPECIAL CARE FACTORS FREQUENCY  PT (By licensed PT), OT (By licensed OT)     PT Frequency: 5x a week OT Frequency: 5x a week            Contractures Contractures Info: Not present    Additional Factors Info  Code Status, Allergies Code Status Info: Full Allergies Info: Acetaminophen, Ibuprofen, Gabapentin, Morphine, Morphine and Related, Zanaflex (Tizanidine)  Current Medications (06/11/2020):  This is the current hospital active medication list No current facility-administered medications for this encounter.   Current Outpatient Medications  Medication Sig Dispense Refill  . dolutegravir (TIVICAY) 50 MG tablet Take 1 tablet by mouth daily.    .  Lactobacillus (PROBIOTIC ACIDOPHILUS) CAPS Take 1 capsule by mouth daily.    . rilpivirine (EDURANT) 25 MG TABS tablet Take 25 mg by mouth daily with breakfast.    . albuterol (VENTOLIN HFA) 108 (90 Base) MCG/ACT inhaler Inhale 2 puffs into the lungs every 6 (six) hours as needed for wheezing or shortness of breath. 6.7 g 0  . amiodarone (PACERONE) 200 MG tablet Take 1 tablet (200 mg total) by mouth daily.    Marland Kitchen amLODipine (NORVASC) 10 MG tablet Take 10 mg by mouth daily.     Marland Kitchen ascorbic acid (VITAMIN C) 500 MG tablet Take 500 mg by mouth 2 (two) times daily.    . bictegravir-emtricitabine-tenofovir AF (BIKTARVY) 50-200-25 MG TABS tablet Take 1 tablet by mouth daily. (Patient not taking: Reported on 06/10/2020) 30 tablet 0  . DEXILANT 60 MG capsule Take 60 mg by mouth daily.     . famotidine (PEPCID) 20 MG tablet Take 20 mg by mouth daily.     Marland Kitchen FLUoxetine (PROZAC) 20 MG capsule Take 20 mg by mouth daily.    Marland Kitchen HYDROcodone-acetaminophen (NORCO/VICODIN) 5-325 MG tablet Take 1 tablet by mouth every 6 (six) hours as needed. 10 tablet 0  . methocarbamol (ROBAXIN) 500 MG tablet Take 500 mg by mouth in the morning, at noon, in the evening, and at bedtime.    . mirtazapine (REMERON) 15 MG tablet Take 1 tablet (15 mg total) by mouth at bedtime. (Patient not taking: Reported on 06/05/2020)    . Multiple Vitamin (MULTIVITAMIN WITH MINERALS) TABS tablet Take 1 tablet by mouth daily.    Marland Kitchen torsemide (DEMADEX) 20 MG tablet Take 2 tablets (40 mg total) by mouth daily.    Grant Ruts INHUB 250-50 MCG/DOSE AEPB Inhale 1 puff into the lungs 2 (two) times daily.       Discharge Medications: Please see discharge summary for a list of discharge medications.  Relevant Imaging Results:  Relevant Lab Results:   Additional Information SS#: [id number].  Victorino Dike, RN

## 2020-06-11 NOTE — ED Notes (Signed)
Dietary called and beef broth ordered for the patient.

## 2020-06-11 NOTE — TOC Initial Note (Signed)
Transition of Care [place]) - Initial/Assessment Note    Patient Details  Name: Melanie Bowen MRN: [id number] Date of Birth: Mar 11, 1961  Transition of Care [facility]) CM/SW Contact:    Victorino Dike, RN Phone Number: 06/11/2020, 9:46 AM  Clinical Narrative:                  Met with patient this morning, with encouragement, she is in agreement to SNF/STR at discharge.  Patient tried to go home without success.  Her daughters cannot provide the care patient needs in the home.    Will start bed search, will need PT/OT evals completed before insurance auth can be started.  EDP entered orders.  Expected Discharge Plan: Skilled Nursing Facility Barriers to Discharge: Continued Medical Work up   Patient Goals and CMS Choice Patient states their goals for this hospitalization and ongoing recovery are:: I need to go to SNF for rehab. CMS Medicare.gov Compare Post Acute Care list provided to:: Patient Choice offered to / list presented to : Patient  Expected Discharge Plan and Services Expected Discharge Plan: [place] In-house Referral: Clinical Social Work Discharge Planning Services: CM Consult   Living arrangements for the past 2 months: [place]                                      Prior Living Arrangements/Services Living arrangements for the past 2 months: Single Family Home Lives with:: Adult Children Patient language and need for interpreter reviewed:: Yes Do you feel safe going back to the place where you live?: Yes      Need for Family Participation in Patient Care: No (Comment) Care giver support system in place?: No (comment) Current home services: DME, Homehealth aide, Home OT, Home PT, Home RN Criminal Activity/Legal Involvement Pertinent to Current Situation/Hospitalization: No - Comment as needed  Activities of Daily Living      Permission Sought/Granted                  Emotional Assessment Appearance:: Appears  older than stated age Attitude/Demeanor/Rapport: Complaining, Engaged Affect (typically observed): Accepting, Frustrated, Overwhelmed, Stable Orientation: : Oriented to Self, Oriented to Place, Oriented to  Time, Oriented to Situation Alcohol / Substance Use: Not Applicable Psych Involvement: No (comment)  Admission diagnosis:  weakness Patient Active Problem List   Diagnosis Date Noted  . Fever 06/09/2020  . Unspecified atrial fibrillation ([place]) 06/09/2020  . Hypokalemia 06/08/2020  . Pressure injury of skin 05/08/2020  . Goals of care, counseling/discussion   . Dyspnea   . Acute renal failure (ARF) ([place]) 04/16/2020  . Severe sepsis with septic shock ([place]) 04/16/2020  . Hypotension 04/16/2020  . Diverticulosis 04/16/2020  . Acute respiratory distress   . DNR (do not resuscitate) discussion   . Palliative care by specialist   . Abscess of sigmoid colon due to diverticulitis 04/14/2020  . Multifocal pneumonia 11/06/2019  . AKI (acute kidney injury) ([place]) 11/06/2019  . Leukocytosis 11/06/2019  . Diverticulitis of large intestine with abscess without bleeding 01/17/2019  . Morbid (severe) obesity due to excess calories ([place]) 11/26/2018  . Violation of controlled substance agreement 12/05/2017  . Positive urine drug screen (+cocaine) 12/05/2017  . Cervicalgia 12/05/2017  . Drug-seeking behavior 12/05/2017  . Community acquired pneumonia 11/28/2016  . Pneumonia 11/28/2016  . Allergic rhinitis 05/27/2016  . Tobacco abuse counseling 02/11/2016  . Symptomatic anemia   .  Lower GI bleed   . Anemia 01/16/2016  . GI bleed 01/10/2016  . GERD (gastroesophageal reflux disease) 01/10/2016  . HTN (hypertension) 01/10/2016  . Depression 01/10/2016  . HIV (human immunodeficiency virus infection) ([place]) 01/10/2016  . Lumbar pseudoarthrosis 10/31/2015  . Chronic cough   . Cough 06/19/2015  . DDD (degenerative disc disease), lumbar 12/31/2014  . Degenerative disc disease, lumbar 12/31/2014   . Asthma, chronic 11/12/2014   PCP:  Theotis Burrow, MD Pharmacy:   [place], [place] - [address] [address] [place] [address] Phone: [phone] Fax: [phone]  [id number] [place], [place] - [place] [place] [address] Phone: [phone] Fax: [phone]     Social Determinants of Health (SDOH) Interventions    Readmission Risk Interventions Readmission Risk Prevention Plan 05/23/2020 11/08/2019  Transportation Screening Complete Complete  Medication Review (RN Care Manager) Complete Complete  PCP or Specialist appointment within 3-5 days of discharge (No Data) Not Complete  PCP/Specialist Appt Not Complete comments - Her PCP is out for 2-3 weeks so other two providers are booked out. The receptionist will call her if there is a cancellation.  [place] or Home Care Consult (No Data) Complete  SW Recovery Care/Counseling Consult Complete Complete  Palliative Care Screening Complete Not Applicable  Skilled Nursing Facility Complete Not Applicable  Some recent data might be hidden

## 2020-06-11 NOTE — ED Notes (Signed)
ED Provider at bedside. 

## 2020-06-11 NOTE — ED Notes (Signed)
PT had small soft BM. Extensive pericare provided to pt, barrier cream applied and new brief. Pillows repositioned under bottom per pt request.

## 2020-06-11 NOTE — H&P (Addendum)
History and Physical  Melanie Bowen GDJ:[id number] DOB: [birth year] DOA: 06/10/2020  Referring physician: Dr. Ellender Hose PCP: Alene Mires Elyse Jarvis, MD  Outpatient Specialists: IR Patient coming from: Home  Chief Complaint: Feeling weak  HPI: Melanie Bowen is a 59 y.o. female with medical history significant for severe morbid obesity, HIV infection, essential hypertension, prior CVA, asthma, chronic depression, COPD, GERD, recently discharged on 06/09/2020 to home after being admitted for complicated diverticulitis post LLQ JP drain placement.  During her prior hospital stay she was assessed by PT with recommendation for SNF placement however she declined and wanted to go home.  She returns from home to [facility] ED today with complaints of generalized weakness and inability to care for herself.  Associated with poor oral intake and left lower quadrant abdominal pain.  Admits to constipation.  Denies dysuria or vomiting, no chest pain.  Work-up revealed hypokalemia, hypomagnesemia.  EDP requests admission for electrolytes replacement and possible SNF placement.  Patient is agreeable to SNF placement.   ED Course: Afebrile.  Vital signs stable.  Lab studies remarkable for serum potassium 2.6, magnesium 1.6.  Twelve-lead EKG normal SR no specific ST T changes.  Review of Systems: Review of systems as noted in the HPI. All other systems reviewed and are negative.   Past Medical History:  Diagnosis Date  . Acute GI hemorrhage 12/2015   required transfusion  . Allergic rhinitis   . Arthritis    DDD  . Asthma   . Blepharitis   . Bronchitis   . Cigarette smoker   . Claustrophobia   . COPD (chronic obstructive pulmonary disease) ([place])   . Cough   . DDD (degenerative disc disease), lumbar 04/10/2014  . Depression   . Diverticulosis   . Ectopic pregnancy   . Eye irritation   . Genital herpes   . GERD (gastroesophageal reflux disease)   . Headache   . HIV (human immunodeficiency virus  infection) ([place])   . Hypertension   . Paresthesia of hand, bilateral   . Shortness of breath dyspnea    at times due to asthma   Past Surgical History:  Procedure Laterality Date  . BACK SURGERY  12/2014   lumbar lam. Dr. Deri Fuelling  . BACK SURGERY  12/25/2012   Removal lumbosubarachnoid shunt system, L5-S1 TF ESI, Dr. Virl Axe Minchew  . back surgery may 2016     lumbar   . BUNIONECTOMY Bilateral    feet  . COLONOSCOPY  12/06/2014   rescheduled from 11/01/2014 due to poor prep  . DIAGNOSTIC LAPAROSCOPY    . DIALYSIS/PERMA CATHETER INSERTION N/A 04/25/2020   Procedure: DIALYSIS/PERMA CATHETER INSERTION;  Surgeon: Algernon Huxley, MD;  Location: [place] [facility];  Service: Cardiovascular;  Laterality: N/A;  . DIALYSIS/PERMA CATHETER REMOVAL N/A 05/23/2020   Procedure: DIALYSIS/PERMA CATHETER REMOVAL;  Surgeon: Algernon Huxley, MD;  Location: [place] [facility];  Service: Cardiovascular;  Laterality: N/A;  . DISTAL FEMUR OSTEOTOMY Right    Dr. Earnestine Leys  . ESOPHAGOGASTRODUODENOSCOPY Left 01/11/2016   Procedure: ESOPHAGOGASTRODUODENOSCOPY (EGD);  Surgeon: Hulen Luster, MD;  Location: [facility] ENDOSCOPY;  Service: Endoscopy;  Laterality: Left;  . Finger fracture Right    5th finger  . FRACTURE SURGERY Right 01/25/2014   closed reduction & percutaneous pinning of 5th digit  . HAND SURGERY Bilateral    carpal tunnel  . JOINT REPLACEMENT Bilateral    knees  . KNEE SURGERY Bilateral 2003,2007   total Joint  . LAPAROSCOPY  FOR ECTOPIC PREGNANCY    . PERIPHERAL VASCULAR CATHETERIZATION N/A 01/12/2016   Procedure: Visceral Angiography;  Surgeon: Algernon Huxley, MD;  Location: [place] [facility];  Service: Cardiovascular;  Laterality: N/A;  . PERIPHERAL VASCULAR CATHETERIZATION N/A 01/12/2016   Procedure: Visceral Artery Intervention;  Surgeon: Algernon Huxley, MD;  Location: [place] [facility];  Service: Cardiovascular;  Laterality: N/A;  . REVISION TOTAL KNEE ARTHROPLASTY Right 12/31/2002   Dr.  Marry Guan  . TUBAL LIGATION    . VIDEO BRONCHOSCOPY N/A 06/30/2015   Procedure: VIDEO BRONCHOSCOPY WITHOUT FLUORO;  Surgeon: Vilinda Boehringer, MD;  Location: [facility] ORS;  Service: Cardiopulmonary;  Laterality: N/A;    Social History:  reports that she has been smoking cigarettes. She has a 9.50 pack-year smoking history. She has never used smokeless tobacco. She reports current alcohol use of about 5.0 standard drinks of alcohol per week. She reports previous drug use.   Allergies  Allergen Reactions  . Acetaminophen Nausea And Vomiting  . Ibuprofen Other (See Comments)    Reports causes bleeding  . Gabapentin Rash  . Morphine Itching and Rash  . Morphine And Related Itching  . Zanaflex  [Tizanidine] Rash    Family History  Problem Relation Age of Onset  . Breast cancer Sister       Prior to Admission medications   Medication Sig Start Date End Date Taking? Authorizing Provider  dolutegravir (TIVICAY) 50 MG tablet Take 1 tablet by mouth daily.   Yes [provider]  Lactobacillus (PROBIOTIC ACIDOPHILUS) CAPS Take 1 capsule by mouth daily.   Yes [provider]  rilpivirine (EDURANT) 25 MG TABS tablet Take 25 mg by mouth daily with breakfast.   Yes [provider]  albuterol (VENTOLIN HFA) 108 (90 Base) MCG/ACT inhaler Inhale 2 puffs into the lungs every 6 (six) hours as needed for wheezing or shortness of breath. 05/21/19   Laban Emperor, PA-C  amiodarone (PACERONE) 200 MG tablet Take 1 tablet (200 mg total) by mouth daily. 05/24/20   Ralene Muskrat B, MD  amLODipine (NORVASC) 10 MG tablet Take 10 mg by mouth daily.     [provider]  ascorbic acid (VITAMIN C) 500 MG tablet Take 500 mg by mouth 2 (two) times daily.    [provider]  bictegravir-emtricitabine-tenofovir AF (BIKTARVY) 50-200-25 MG TABS tablet Take 1 tablet by mouth daily. Patient not taking: Reported on 06/10/2020 06/06/20   Tsosie Billing, MD  DEXILANT 60 MG capsule  Take 60 mg by mouth daily.  04/25/19   [provider]  famotidine (PEPCID) 20 MG tablet Take 20 mg by mouth daily.  05/08/19   [provider]  FLUoxetine (PROZAC) 20 MG capsule Take 20 mg by mouth daily.    [provider]  HYDROcodone-acetaminophen (NORCO/VICODIN) 5-325 MG tablet Take 1 tablet by mouth every 6 (six) hours as needed. 06/03/20   Horton, Barbette Hair, MD  methocarbamol (ROBAXIN) 500 MG tablet Take 500 mg by mouth in the morning, at noon, in the evening, and at bedtime. 03/31/20   [provider]  mirtazapine (REMERON) 15 MG tablet Take 1 tablet (15 mg total) by mouth at bedtime. Patient not taking: Reported on 06/05/2020 05/23/20   Sidney Ace, MD  Multiple Vitamin (MULTIVITAMIN WITH MINERALS) TABS tablet Take 1 tablet by mouth daily.    [provider]  torsemide (DEMADEX) 20 MG tablet Take 2 tablets (40 mg total) by mouth daily. 05/24/20   Sidney Ace, MD  WIXELA INHUB 250-50 MCG/DOSE AEPB Inhale 1 puff into the lungs 2 (two) times daily. 11/05/19   [provider]    Physical Exam: BP 115/60   Pulse 99   Temp 98.2 F (36.8 C) (Oral)   Resp (!) 23   Ht 5\' 6"  (1.676 m)   Wt 115 kg   SpO2 96%   BMI 40.92 kg/m   . General: 59 y.o. year-old female well developed well nourished in no acute distress.  Alert and oriented x3. . Cardiovascular: Regular rate and rhythm with no rubs or gallops.  No thyromegaly or JVD noted.  No lower extremity edema. 2/4 pulses in all 4 extremities. Marland Kitchen Respiratory: Clear to auscultation with no wheezes or rales. Good inspiratory effort. . Abdomen: Soft nontender nondistended with normal bowel sounds x4 quadrants.  LLQ JP drain in place. . Muskuloskeletal: No cyanosis, clubbing or edema noted bilaterally . Neuro: CN II-XII intact, strength, sensation, reflexes . Skin: No ulcerative lesions noted or rashes . Psychiatry: Judgement and insight appear normal. Mood is appropriate for condition  and setting          Labs on Admission:  Basic Metabolic Panel: Recent Labs  Lab 06/05/20 1305 06/08/20 1653 06/09/20 0441 06/10/20 1431 06/11/20 1110  NA 137 138 136 135 135  K 2.9* 2.3* 2.9* 2.8* 2.6*  CL 102 102 102 102 101  CO2 22 26 24 23 24   GLUCOSE 63* 80 67* 69* 68*  BUN 30* 22* 21* 20 18  CREATININE 0.92 1.10* 1.10* 1.10* 0.96  CALCIUM 7.2* 6.6* 6.5* 6.8* 6.9*  MG  --  1.0*  --  1.4* 1.6*   Liver Function Tests: Recent Labs  Lab 06/05/20 1305 06/11/20 1152  AST 27 25  ALT 19 13  ALKPHOS 96 99  BILITOT 1.0 0.9  PROT 5.9* 5.5*  ALBUMIN 1.2* 1.2*   Recent Labs  Lab 06/11/20 1152  LIPASE 26   No results for input(s): AMMONIA in the last 168 hours. CBC: Recent Labs  Lab 06/05/20 1522 06/08/20 1653 06/09/20 0441 06/10/20 1431  WBC 13.3* 9.7 9.6 9.4  NEUTROABS 7.8* 5.7  --   --   HGB 6.9* 8.8* 8.0* 8.6*  HCT 20.6* 26.2* 24.5* 26.4*  MCV 87.7 88.5 90.1 89.5  PLT 243 363 372 438*   Cardiac Enzymes: No results for input(s): CKTOTAL, CKMB, CKMBINDEX, TROPONINI in the last 168 hours.  BNP (last 3 results) Recent Labs    11/04/19 0821  BNP 28.0    ProBNP (last 3 results) No results for input(s): PROBNP in the last 8760 hours.  CBG: Recent Labs  Lab 06/05/20 2218 06/05/20 2333 06/06/20 0641 06/06/20 1751 06/11/20 1447  GLUCAP 69* 96 76 84 56*    Radiological Exams on Admission: No results found.  EKG: I independently viewed the EKG done and my findings are as followed: SR 76.   Assessment/Plan Present on Admission: **None**  Active Problems:   Weakness  Generalized weakness, likely multifactorial secondary to hypokalemia, dehydration secondary to poor oral intake Presented with serum potassium 2.6 with magnesium 1.6 Replete aggressively, intravenously and orally. Repeat levels in the morning Encourage oral intake Start oral supplement PT OT to assess in the AM Fall precautions  Hypokalemia/Hypomagnesemia Management per  above Repeat levels AM  Anemia of chronic disease Hg stable at 8.6 with MCV 89 No overt bleeding Monitor Transfuse hemoglobin less than 7.0.  HIV Resume home HAART  Chronic diastolic CHF Euvolemic on exam Hold off diuretic  Essential  HTN BP is stable Resume home antihypertensives Monitor Bps  Chronic anxiety/depression Stable  Resume home regimen  GERD Resume home PPI    DVT prophylaxis: SQ lovenox daily  Code Status: Full code per the patient  Family Communication: None at bedside  Disposition Plan: Admit to MedSurg  Consults called:  None  Admission status: Observation status   Status is: Observation    Dispo:  Patient From: Home  Planned Disposition: [place]  Expected discharge date: 06/12/20  Medically stable for discharge: No, ongoing management of electrolytes imbalance.   Kayleen Memos MD [facility] Pager [phone]  If 7PM-7AM, please contact night-coverage [email or web address] Password [facility]  06/11/2020, 3:37 PM

## 2020-06-11 NOTE — ED Notes (Addendum)
Pt resting with even, unlabored snoring respirations  IVF infusing

## 2020-06-11 NOTE — ED Notes (Signed)
Pt transitioned into hospital bed, new brief in place, pillows repositioned under pt for comfort and support.

## 2020-06-11 NOTE — ED Notes (Signed)
Pharmacy called and notified RN that the hospital does not have the patient's Rilpivirine.  Pt states she will call her daughters to bring it to the hospital.

## 2020-06-11 NOTE — ED Provider Notes (Signed)
Patient care assumed, case reviewed. Patient is persistently hypokalemic and hypoglycemic despite oral and IV replacement. Suspect this is due to increased output from JP drain, recent illness. Will admit for fluids, lyte replacement.   Duffy Bruce, MD 06/11/20 1538

## 2020-06-11 NOTE — Evaluation (Signed)
Occupational Therapy Evaluation Patient Details Name: Melanie Bowen MRN: [id number] DOB: Jun 19, 1961 Today's Date: 06/11/2020    History of Present Illness Melanie Bowen is a 59 y.o. female with PMH of HTN, diverticulosis, enteric infection, morbid obesity, GERD, COPD, HIV infection, history of stroke, asthma, depression, seizures, and recent ground-level fall at rehab (10/5) status post right humeral head fracture. She presents via EMS from her group facility after routine labs drawn today showed patient to be anemic.  Patient states she has significant muscle soreness in her right arm and decreased range of motion since her fall. She also has a stage 2 sacral wound that developed at the rehab facility   Clinical Impression   Patient presenting with decreased I in self care, balance, functional mobility/transfers, endurance, and safety awareness.  Patient reports being fully independent PTA. However, this clinician is familiar with pt and she has not been independent since August 2021 admission. Pt very vague about level of assist since returning home from last hospital admission. She does verbalize being in bed and family coming over to check in and change her. No mention of 24/7 assistance. Patient currently functioning at mod - total +2 assistance. Pt needing +2 assistance for rolling and hygiene after bowel incontinence. Pt verbalized, " I can't go back home this time." Patient will benefit from acute OT to increase overall independence in the areas of ADLs, functional mobility, and safety in order to safely discharge to next venue of care.    Follow Up Recommendations  SNF    Equipment Recommendations  Other (comment) (defer to next venue of care)       Precautions / Restrictions Precautions Precautions: Fall Precaution Comments: Acute Rt humeral/scapualr fractures, percutaneous drainage tube abd Restrictions Weight Bearing Restrictions: Yes RUE Weight Bearing: Non weight  bearing Other Position/Activity Restrictions: abd drain      Mobility Bed Mobility Overal bed mobility: Needs Assistance Bed Mobility: Supine to Sit;Sit to Supine;Rolling Rolling: Max assist;Total assist         General bed mobility comments: total A to roll to the L and max A to the R with second helper needed for hygiene  Transfers     General transfer comment: deferred for safety        ADL either performed or assessed with clinical judgement   ADL Overall ADL's : Needs assistance/impaired Eating/Feeding: Set up   Grooming: Wash/dry face;Oral care;Bed level;Set up   Upper Body Bathing: Moderate assistance;Bed level   Lower Body Bathing: Bed level;Maximal assistance;Total assistance   Upper Body Dressing : Moderate assistance;Bed level   Lower Body Dressing: Total assistance;+2 for physical assistance;Maximal assistance         Toileting - Clothing Manipulation Details (indicate cue type and reason): total A +2 for hygiene and clothing management after BM at bed level.             Vision Patient Visual Report: No change from baseline              Pertinent Vitals/Pain Pain Assessment: Faces Faces Pain Scale: Hurts little more Pain Location: Sacrum Pain Descriptors / Indicators: Sore Pain Intervention(s): Limited activity within patient's tolerance;Monitored during session;Repositioned     Hand Dominance Right   Extremity/Trunk Assessment Upper Extremity Assessment Upper Extremity Assessment: Generalized weakness;RUE deficits/detail RUE Deficits / Details: R arm in sling, unable to test RUE: Unable to fully assess due to immobilization   Lower Extremity Assessment Lower Extremity Assessment: Generalized weakness       Communication  Communication Communication: No difficulties   Cognition Arousal/Alertness: Awake/alert Behavior During Therapy: WFL for tasks assessed/performed Overall Cognitive Status: Within Functional Limits for tasks  assessed         General Comments: Patient is A&Ox4, however did not mention she is to be NWB on R UE and sling is not present              Home Living Family/patient expects to be discharged to:: Private residence Living Arrangements: Alone Available Help at Discharge: Family;Available 24 hours/day;Friend(s) Type of Home: House Home Access: Stairs to enter [employer] of Steps: 3 Entrance Stairs-Rails: Can reach both Home Layout: One level     Bathroom Shower/Tub: Teacher, early years/pre: Standard Bathroom Accessibility: Yes   Home Equipment: Environmental consultant - 2 wheels          Prior Functioning/Environment Level of Independence: Independent  Gait / Transfers Assistance Needed: walked with no assistance, stairs to enter house ADL's / Homemaking Assistance Needed: I with all self care and care of home   Comments: had been working but some days family and friends helped care for her. She has not been independent at home since initial hospital stay in August of this year. She has been at a SNF and then returned home and had several ED visits since then. She declined SNF and returned home only to return because she can't care for herself.        OT Problem List: Decreased strength;Pain;Decreased range of motion;Decreased activity tolerance;Decreased safety awareness;Impaired balance (sitting and/or standing)      OT Treatment/Interventions: Self-care/ADL training;Therapeutic exercise;Therapeutic activities;Energy conservation;DME and/or AE instruction;Patient/family education;Balance training    OT Goals(Current goals can be found in the care plan section) Acute Rehab OT Goals Patient Stated Goal: to go to rehab OT Goal Formulation: With patient Time For Goal Achievement: 06/25/20 Potential to Achieve Goals: Fair  OT Frequency: Min 1X/week   Barriers to D/C: Decreased caregiver support             AM-PAC OT "6 Clicks" Daily Activity     Outcome  Measure Help from another person eating meals?: A Lot Help from another person taking care of personal grooming?: Total Help from another person toileting, which includes using toliet, bedpan, or urinal?: Total Help from another person bathing (including washing, rinsing, drying)?: Total Help from another person to put on and taking off regular upper body clothing?: A Lot Help from another person to put on and taking off regular lower body clothing?: Total 6 Click Score: 8   End of Session Nurse Communication: Mobility status  Activity Tolerance: Patient limited by fatigue Patient left: in bed;with call bell/phone within reach  OT Visit Diagnosis: Muscle weakness (generalized) (M62.81)                Time: 9678-9381 OT Time Calculation (min): 21 min Charges:  OT General Charges $OT Visit: 1 Visit OT Evaluation $OT Eval Moderate Complexity: 1 Mod OT Treatments $Self Care/Home Management : 8-22 mins  Darleen Crocker, MS, OTR/L , CBIS ascom [phone]  06/11/20, 12:14 PM

## 2020-06-11 NOTE — ED Notes (Signed)
IV team at bedside with patient.

## 2020-06-11 NOTE — Evaluation (Signed)
Physical Therapy Evaluation Patient Details Name: Melanie Bowen MRN: [id number] DOB: [birth year] Today's Date: 06/11/2020   History of Present Illness  presented to ER from home secondary to weakness, inability to care for self in home environment.  Recent medical history significant for hospitalization 8/16-9/24 due to acute diverticulitis with diverticular abscess, JP drain (discharge to STR); ER presentation (10/4-5) after fall at rehab with R humeral fracture, managed non-operatively (return to STR); hospital admission (10/7-11) due to fever, hypokalemia (discharge home, refused STR).  Clinical Impression  Upon evaluation, patient alert and oriented; follows commands and agreeable to participation with therapy.  Voices eagerness "to walk again".  Patient noted without sling to R UE (recent humeral fracture); denies recent use and does not have with her on this admission.  RN informed/aware and to have additional sling ordered/placed to protect R UE.  Does require frequent cuing to minimize use/WBing of R UE with mobility efforts.  Patient globally weak and deconditioned throughout bilat LEs; requiring significant assist to stabilize/prevent buckling in standing position.  Currently requiring mod/max assist for bed mobility; max assist +2 for sit/stand and standing balance with single UE support; max/dep +2 for attempted stepping laterally along edge of bed.  Significant buckling/collapse of R > L LE with attempts at stepping; unsafe/unable to attempt additional. Would benefit from skilled PT to address above deficits and promote optimal return to PLOF.; recommend transition to STR upon discharge from acute hospitalization.     Follow Up Recommendations SNF    Equipment Recommendations       Recommendations for Other Services       Precautions / Restrictions Precautions Precautions: Fall Precaution Comments: Acute Rt humeral/scapualr fractures, percutaneous drainage tube  abd Restrictions Weight Bearing Restrictions: Yes RUE Weight Bearing: Non weight bearing Other Position/Activity Restrictions: sling not present during session; R UE maintained in NWB and immobilized as able during session. RN informed/aware and to order new sling for patient      Mobility  Bed Mobility Overal bed mobility: Needs Assistance Bed Mobility: Supine to Sit;Sit to Supine Rolling: Max assist   Supine to sit: Mod assist Sit to supine: Max assist;+2 for physical assistance   General bed mobility comments: total A to roll to the L and max A to the R with second helper needed for hygiene  Transfers Overall transfer level: Needs assistance Equipment used: 1 person hand held assist Transfers: Sit to/from Stand Sit to Stand: Max assist;+2 physical assistance         General transfer comment: elevated bed surface; extensive assist for lift off, bilat hip/knee stabilization  Ambulation/Gait Ambulation/Gait assistance: Max assist;+2 physical assistance Gait Distance (Feet): 1 Feet Assistive device: 1 person hand held assist       General Gait Details: single step with R LE (laterally along edge of bed), max assist to stabilize L hip/knee in loading. Attempted additional step with L LE, but full buckling of R knee with loading (max/dep assist for sit onto bed). Unsafe/unable to attempt additional steps  Stairs            Wheelchair Mobility    Modified Rankin (Stroke Patients Only)       Balance Overall balance assessment: Needs assistance Sitting-balance support: Feet supported;No upper extremity supported Sitting balance-Leahy Scale: Good     Standing balance support: Single extremity supported Standing balance-Leahy Scale: Poor Standing balance comment: assist to stabilize bilat hips/knees; poor control in A/P plane  Pertinent Vitals/Pain Pain Assessment: No/denies pain Faces Pain Scale: Hurts little more Pain  Location: Sacrum Pain Descriptors / Indicators: Sore Pain Intervention(s): Limited activity within patient's tolerance;Monitored during session;Repositioned    Home Living Family/patient expects to be discharged to:: Private residence Living Arrangements: Alone Available Help at Discharge: Family;Available 24 hours/day;Friend(s) Type of Home: House Home Access: Stairs to enter Entrance Stairs-Rails: Can reach both Entrance Stairs-Number of Steps: 3 Home Layout: One level Home Equipment: Walker - 2 wheels Additional Comments: history obtained from chart    Prior Function Level of Independence: Independent   Gait / Transfers Assistance Needed: walked with no assistance, stairs to enter house  ADL's / [place] Needed: I with all self care and care of home  Comments: At baseline, ambulatory without assist device; experienced progressive functional decline since extended hospitalization in Aug/Sept, non-ambulatory and requiring extensive physical assist for all activities since.  Reports aprticipation with rehab when in STR, but seems to have done minimal OOB activities (denies transfers, standing or gait)     Hand Dominance   Dominant Hand: Right    Extremity/Trunk Assessment   Upper Extremity Assessment Upper Extremity Assessment:  (R shoulder ROM deferred due to recent fracture; elbow and wrist appear WFL.  L UE grossly WFL) RUE Deficits / Details: R arm in sling, unable to test RUE: Unable to fully assess due to immobilization    Lower Extremity Assessment Lower Extremity Assessment: Generalized weakness (grossly 3-/5 throughout; difficulty with bilat knee flexion beyond 90 degrees)       Communication   Communication: No difficulties  Cognition Arousal/Alertness: Awake/alert Behavior During Therapy: WFL for tasks assessed/performed Overall Cognitive Status: Within Functional Limits for tasks assessed                                 General  Comments: Frequent cuing for NWB/immobilization of R shoulder      General Comments      Exercises     Assessment/Plan    PT Assessment Patient needs continued PT services  PT Problem List Decreased strength;Decreased range of motion;Decreased activity tolerance;Decreased balance;Decreased mobility;Decreased coordination;Decreased cognition;Decreased knowledge of use of DME;Decreased safety awareness;Cardiopulmonary status limiting activity;Obesity;Decreased skin integrity;Pain       PT Treatment Interventions DME instruction;Gait training;Stair training;Functional mobility training;Therapeutic activities;Therapeutic exercise;Balance training;Neuromuscular re-education;Patient/family education    PT Goals (Current goals can be found in the Care Plan section)  Acute Rehab PT Goals Patient Stated Goal: to go to rehab, to walk again PT Goal Formulation: With patient Time For Goal Achievement: 06/20/20 Potential to Achieve Goals: Fair    Frequency Min 2X/week   Barriers to discharge Inaccessible home environment;Decreased caregiver support      Co-evaluation               AM-PAC PT "6 Clicks" Mobility  Outcome Measure Help needed turning from your back to your side while in a flat bed without using bedrails?: A Lot Help needed moving from lying on your back to sitting on the side of a flat bed without using bedrails?: A Lot Help needed moving to and from a bed to a chair (including a wheelchair)?: Total Help needed standing up from a chair using your arms (e.g., wheelchair or bedside chair)?: A Lot Help needed to walk in hospital room?: Total Help needed climbing 3-5 steps with a railing? : Total 6 Click Score: 9    End of Session Equipment Utilized  During Treatment: Gait belt Activity Tolerance: Patient tolerated treatment well Patient left: in bed;with call bell/phone within reach Nurse Communication: Mobility status PT Visit Diagnosis: Unsteadiness on feet  (R26.81);Muscle weakness (generalized) (M62.81);Difficulty in walking, not elsewhere classified (R26.2);Other abnormalities of gait and mobility (R26.89);History of falling (Z91.81)    Time: 5056-9794 PT Time Calculation (min) (ACUTE ONLY): 16 min   Charges:   PT Evaluation $PT Eval Moderate Complexity: 1 Mod          Calandria Mullings H. Owens Shark, PT, DPT, NCS 06/11/20, 2:08 PM [phone]

## 2020-06-11 NOTE — ED Notes (Signed)
Pt states she hasnt eaten any of her food at this time.  CBG checked and it is low.  Pt eating banana now and drinking juice. CBG to be rechecked post PO fluids.

## 2020-06-11 NOTE — ED Notes (Signed)
Pt had small BM and had urinated. Pt cleaned up and clean chux and brief placed on pt. New duoderm placed on pt's sacrum. Pt placed in clean gown and repositioned with pillows onto side. Pt given water. Pt given blankets and resting at this time

## 2020-06-11 NOTE — ED Notes (Signed)
Lab called and notified of lab add-ons, lab also notified that lactic was placed and they will come to collect from patient.

## 2020-06-12 DIAGNOSIS — R531 Weakness: Secondary | ICD-10-CM | POA: Diagnosis not present

## 2020-06-12 LAB — GLUCOSE, CAPILLARY: Glucose-Capillary: 83 mg/dL (ref 70–99)

## 2020-06-12 LAB — BASIC METABOLIC PANEL
Anion gap: 7 (ref 5–15)
BUN: 14 mg/dL (ref 6–20)
CO2: 24 mmol/L (ref 22–32)
Calcium: 6.9 mg/dL — ABNORMAL LOW (ref 8.9–10.3)
Chloride: 105 mmol/L (ref 98–111)
Creatinine, Ser: 0.91 mg/dL (ref 0.44–1.00)
GFR, Estimated: >60 mL/min (ref >60)
Glucose, Bld: 74 mg/dL (ref 70–99)
Potassium: 4.1 mmol/L (ref 3.5–5.1)
Sodium: 136 mmol/L (ref 135–145)

## 2020-06-12 LAB — MAGNESIUM: Magnesium: 2 mg/dL (ref 1.7–2.4)

## 2020-06-12 MED ORDER — AMIODARONE HCL 200 MG PO TABS: 200.0000 mg | ORAL_TABLET | Freq: Every day | ORAL | 0 refills | Status: DC

## 2020-06-12 MED ORDER — ENSURE ENLIVE PO LIQD: 237.0000 mL | Freq: Two times a day (BID) | ORAL | 0 refills | Status: AC

## 2020-06-12 MED ORDER — BICTEGRAVIR-EMTRICITAB-TENOFOV 50-200-25 MG PO TABS: 1.0000 | ORAL_TABLET | Freq: Every day | ORAL | Status: DC

## 2020-06-12 MED ORDER — AMIODARONE HCL 200 MG PO TABS
200.0000 mg | ORAL_TABLET | Freq: Every day | ORAL | 0 refills | Status: DC
Start: 2020-06-12 — End: 2020-06-12

## 2020-06-12 MED ORDER — POTASSIUM CHLORIDE CRYS ER 10 MEQ PO TBCR: 10.0000 meq | EXTENDED_RELEASE_TABLET | Freq: Every day | ORAL | 0 refills | Status: DC

## 2020-06-12 MED ORDER — BICTEGRAVIR-EMTRICITAB-TENOFOV 50-200-25 MG PO TABS: 1.0000 | ORAL_TABLET | Freq: Every day | ORAL | 0 refills | Status: DC

## 2020-06-12 MED ORDER — BICTEGRAVIR-EMTRICITAB-TENOFOV 50-200-25 MG PO TABS: 1.0000 | ORAL_TABLET | Freq: Every day | ORAL | 0 refills | Status: AC

## 2020-06-12 NOTE — ED Notes (Addendum)
Pt placed on bed pan for another BM. Encouraged pt to let staff know when she has to stool so that we can continue to use the bedpan.  Informed on call NP that pt has had multiple loose stools and that pt's having burning on sores from constant stool and being cleaned up. No new orders.

## 2020-06-12 NOTE — ED Notes (Signed)
Resumed care from [place], [place]. Pt resting, c/o 9/10 pain at drainage site. Pt requests medication to reduce/eliminate loose stools. Pt alert & oriented, asked for help with food opening, which was provided. NAD noted.

## 2020-06-12 NOTE — ED Notes (Signed)
Peri care and brief change, pt had another loose stool

## 2020-06-12 NOTE — ED Notes (Signed)
Pt discharged home after verbalizing understanding of discharge instructions; nad noted. 

## 2020-06-12 NOTE — Discharge Instructions (Signed)

## 2020-06-12 NOTE — TOC Progression Note (Signed)
Transition of Care [facility]) - Progression Note    Patient Details  Name: MAEBEL MARASCO MRN: [id number] Date of Birth: April 28, 1961  [facility] CM/SW Contact  Anselm Pancoast, RN Phone Number: 06/12/2020, 9:41 AM  Clinical Narrative:    Received call from [facility] with 5 day authorization for SNF admission. Auth# [id number]. Next review due 06/16/20. Information can be faxed to [phone].    Expected Discharge Plan: [place] Barriers to Discharge: Continued Medical Work up  Expected Discharge Plan and Services Expected Discharge Plan: [place] In-house Referral: Clinical Social Work Discharge Planning Services: CM Consult   Living arrangements for the past 2 months: Single Family Home Expected Discharge Date: 06/12/20                                     Social Determinants of Health (SDOH) Interventions    Readmission Risk Interventions Readmission Risk Prevention Plan 05/23/2020 11/08/2019  Transportation Screening Complete Complete  Medication Review Press photographer) Complete Complete  PCP or Specialist appointment within 3-5 days of discharge (No Data) Not Complete  PCP/Specialist Appt Not Complete comments - Her PCP is out for 2-3 weeks so other two providers are booked out. The receptionist will call her if there is a cancellation.  [place] or Home Care Consult (No Data) Complete  SW Recovery Care/Counseling Consult Complete Complete  Palliative Care Screening Complete Not Applicable  Skilled Nursing Facility Complete Not Applicable  Some recent data might be hidden

## 2020-06-12 NOTE — ED Notes (Signed)
Pericare. New sacral pad.

## 2020-06-12 NOTE — Discharge Summary (Signed)
Discharge Summary  Melanie Bowen:[id number] DOB: Jan 06, 1961  PCP: Melanie Burrow, MD  Admit date: 06/10/2020 Discharge date: 06/12/2020  Time spent: 35 minutes   Recommendations for Outpatient Follow-up:  1. Follow up with general surgery 2. Follow up with IR 3. Follow up with ID 4. Follow up with cardiology 5. Follow up with your PCP within a week and repeat BMP on Monday  6. Take your medications as prescribed 7. Continue PT OT with assistance and fall precautions   Discharge Diagnoses:  Active Hospital Problems   Diagnosis Date Noted  . Weakness 06/11/2020    Resolved Hospital Problems  No resolved problems to display.    Discharge Condition: Stable   Diet recommendation: Resume previous diet   Vitals:   06/12/20 0630 06/12/20 0640  BP:  (!) 102/57  Pulse:  (!) 103  Resp: 20 15  Temp:    SpO2:  94%    History of present illness:   Melanie Bowen is a 59 y.o. female with medical history significant for severe morbid obesity, HIV infection, essential hypertension, prior CVA, asthma, chronic depression, COPD, GERD, recently discharged on 06/09/2020 to home after being admitted for complicated diverticulitis post LLQ JP drain placement.  During her prior hospital stay she was assessed by PT with recommendation for SNF placement however she declined and wanted to go home.  She returns from home to [facility] ED today with complaints of generalized weakness and inability to care for herself.  Associated with poor oral intake and left lower quadrant abdominal pain.  Admits to constipation.  Denies dysuria or vomiting, no chest pain.  Work-up revealed hypokalemia, hypomagnesemia.  EDP requests admission for electrolytes replacement and possible SNF placement.  Patient is agreeable to SNF placement.   ED Course: Afebrile.  Vital signs stable.  Lab studies remarkable for serum potassium 2.6, magnesium 1.6.  Twelve-lead EKG normal SR no specific ST T  changes.  06/12/20:  She has no new complaints.  Agreeable to go to SNF for rehab.  Vital signs and labs reviewed and are stable.  Hospital Course:  Active Problems:   Weakness  Generalized weakness, likely multifactorial secondary to hypokalemia, dehydration secondary to poor oral intake and diuretic Presented with serum potassium 2.6 with magnesium 1.6 Held torsemide which likely contributed in depletion of her K+ and Mg2+ Repleted, K+ 4.1 and Mg2+ 2.0 on 06/12/20. Continue to Encourage oral intake Continue oral supplement Continue PT OT and Fall precautions  Resolved post repletion: Hypokalemia/Hypomagnesemia Management per above Continue to hold Torsemide Replete K+ 10 meq daily x 7 days and repeat BMP on Monday 06/16/20  Anemia of chronic disease Hg stable at 8.6 with MCV 89 No overt bleeding  HIV Resume home HAART  History of Peripheral edema Euvolemic on exam Hold off home Torsemide  Essential HTN BP is stable Resume home Norvasc and amiodarone  Chronic anxiety/depression Stable  Resume home regimen  GERD Resume home PPI  Recent complicated diverticulitis post JP drain placement by IR Recent admission for which she was followed by PCCM, ID, general surgery, IR and cardiology Last CT abd pelvis and contrast was done on 06/09/20. Per general surgery, Dr. Dahlia Bowen: " Recommend continuation of left lower quadrant drain.  May follow-up as an outpatient.  No need for surgical intervention at this time." Follow up with General surgery, IR, and ID  Recent Paroxismal Afib with RVR Recent admission Was seen by cardiology, Dr. Ubaldo Bowen "Will decrease amiodarone to 200 bid and would continue  with this dose for at least 3 weeks. Will need to consider long term anticoagulation when drains are removed an bcess resolved."  Follow up with cardiology  Resolved AKI requiring HD Cr 0.94 with GFR>60 on 06/12/20 Follow up with your PCP Continue to avoid  nephrotoxins    Code Status: Full code per the patient     Procedures:  None  Consultations:  None  Discharge Exam: BP (!) 102/57   Pulse (!) 103   Temp 98.2 F (36.8 C) (Oral)   Resp 15   Ht 5\' 6"  (1.676 m)   Wt 115 kg   SpO2 94%   BMI 40.92 kg/m  . General: 59 y.o. year-old female well developed well nourished in no acute distress.  Alert and oriented x3. . Cardiovascular: Regular rate and rhythm with no rubs or gallops.  No thyromegaly or JVD noted.   Marland Kitchen Respiratory: Clear to auscultation with no wheezes or rales. Good inspiratory effort. . Abdomen: Soft nontender nondistended with normal bowel sounds x4 quadrants. JP drain in place LLQ abdomen. . Musculoskeletal: No lower extremity edema. 2/4 pulses in all 4 extremities. Marland Kitchen Psychiatry: Mood is appropriate for condition and setting  Discharge Instructions You were cared for by a hospitalist during your hospital stay. If you have any questions about your discharge medications or the care you received while you were in the hospital after you are discharged, you can call the unit and asked to speak with the hospitalist on call if the hospitalist that took care of you is not available. Once you are discharged, your primary care physician will handle any further medical issues. Please note that NO REFILLS for any discharge medications will be authorized once you are discharged, as it is imperative that you return to your primary care physician (or establish a relationship with a primary care physician if you do not have one) for your aftercare needs so that they can reassess your need for medications and monitor your lab values.   Allergies as of 06/12/2020      Reactions   Acetaminophen Nausea And Vomiting   Ibuprofen Other (See Comments)   Reports causes bleeding   Gabapentin Rash   Morphine Itching, Rash   Morphine And Related Itching   Zanaflex  [tizanidine] Rash      Medication List    STOP taking these  medications   acidophilus Caps capsule   ALPRAZolam 0.25 MG tablet Commonly known as: XANAX   amLODipine 10 MG tablet Commonly known as: NORVASC   dolutegravir 50 MG tablet Commonly known as: TIVICAY   HYDROcodone-acetaminophen 5-325 MG tablet Commonly known as: NORCO/VICODIN   methocarbamol 500 MG tablet Commonly known as: ROBAXIN   mirtazapine 15 MG tablet Commonly known as: REMERON   Probiotic Acidophilus Caps   rilpivirine 25 MG Tabs tablet Commonly known as: EDURANT   torsemide 20 MG tablet Commonly known as: DEMADEX     TAKE these medications   albuterol 108 (90 Base) MCG/ACT inhaler Commonly known as: VENTOLIN HFA Inhale 2 puffs into the lungs every 6 (six) hours as needed for wheezing or shortness of breath.   amiodarone 200 MG tablet Commonly known as: PACERONE Take 1 tablet (200 mg total) by mouth daily for 21 days.   ascorbic acid 500 MG tablet Commonly known as: VITAMIN C Take 500 mg by mouth 2 (two) times daily.   bictegravir-emtricitabine-tenofovir AF 50-200-25 MG Tabs tablet Commonly known as: BIKTARVY Take 1 tablet by mouth daily. Start taking on: June 13, 2020   Dexilant 60 MG capsule Generic drug: dexlansoprazole Take 60 mg by mouth daily.   famotidine 20 MG tablet Commonly known as: PEPCID Take 20 mg by mouth daily.   feeding supplement Liqd Take 237 mLs by mouth 2 (two) times daily between meals for 7 days.   FLUoxetine 20 MG capsule Commonly known as: PROZAC Take 20 mg by mouth daily.   multivitamin with minerals Tabs tablet Take 1 tablet by mouth daily.   potassium chloride 10 MEQ tablet Commonly known as: KLOR-CON Take 1 tablet (10 mEq total) by mouth daily for 7 days.   Wixela Inhub 250-50 MCG/DOSE Aepb Generic drug: Fluticasone-Salmeterol Inhale 1 puff into the lungs 2 (two) times daily.      Allergies  Allergen Reactions  . Acetaminophen Nausea And Vomiting  . Ibuprofen Other (See Comments)    Reports causes  bleeding  . Gabapentin Rash  . Morphine Itching and Rash  . Morphine And Related Itching  . Zanaflex  [Tizanidine] Rash    Contact information for follow-up providers    Revelo, Elyse Jarvis, MD. Call in 1 day(s).   Specialty: Family Medicine Why: Please call for a post hospital follow up appointment Contact information: [address] [place] [address] [phone]        Tsosie Billing, MD. Call in 1 day(s).   Specialty: Infectious Diseases Why: Please call for a post hospital follow-up appointment Contact information: [place] [address] [phone]        Teodoro Spray, MD. Call in 1 day(s).   Specialty: Cardiology Why: Please call for a post hospital follow-up appointment Contact information: [place] [address] [phone]        Suzette Battiest, MD. Call in 1 day(s).   Specialties: Interventional Radiology, Diagnostic Radiology, Radiology Why: Please call for a post hospital follow-up appointment Contact information: [place] [address] [phone]        Jules Husbands, MD. Call in 1 day(s).   Specialty: General Surgery Why: Please call for a post hospital follow-up appointment Contact information: [address] [place] [address] [phone]            Contact information for after-discharge care    Destination    [facility] Preferred SNF .   Service: Skilled Nursing Contact information: [address] [place] [phone]                   The results of significant diagnostics from this hospitalization (including imaging, microbiology, ancillary and laboratory) are listed below for reference.    Significant Diagnostic Studies: CT ABDOMEN PELVIS WO CONTRAST  Result Date: 06/09/2020 CLINICAL DATA:  Recent diverticular abscess with drain. New ultrasound  demonstrating possible loculated ascites. EXAM: CT ABDOMEN AND PELVIS WITHOUT CONTRAST TECHNIQUE: Multidetector CT imaging of the abdomen and pelvis was performed following the standard protocol without IV contrast. COMPARISON:  CT dated 05/12/2020 FINDINGS: Lower chest: There are trace to small bilateral pleural effusions, left greater than right.The heart size is normal. Hepatobiliary: The liver is normal. There is hyperdense material within the gallbladder similar to prior study.There is no biliary ductal dilation. Pancreas: Normal contours without ductal dilatation. No peripancreatic fluid collection. Spleen: Unremarkable. Adrenals/Urinary Tract: --Adrenal glands: Unremarkable. --Right kidney/ureter: No hydronephrosis or radiopaque kidney stones. --Left kidney/ureter: No hydronephrosis or radiopaque kidney stones. --Urinary bladder: There is mild asymmetric  bladder wall thickening, likely reactive. Stomach/Bowel: --Stomach/Duodenum: No hiatal hernia or other gastric abnormality. Normal duodenal course and caliber. --Small bowel: Unremarkable. --Colon: Again noted is sigmoid diverticulosis. A percutaneous drain is again located in the patient's midline pelvis. There is a persistent small fluid and air pocket surrounding the drainage catheter. There is persistent wall thickening of the sigmoid colon. There is extraluminal contrast adjacent to the catheter consistent with a persistent perforation or fistula. There is circumferential wall thickening of the splenic flexure of the colon. --Appendix: Not visualized. No right lower quadrant inflammation or free fluid. Vascular/Lymphatic: Atherosclerotic calcification is present within the non-aneurysmal abdominal aorta, without hemodynamically significant stenosis. --No retroperitoneal lymphadenopathy. --No mesenteric lymphadenopathy. --No pelvic or inguinal lymphadenopathy. Reproductive: Unremarkable Other: There is persistent extraluminal oral contrast in the right  upper quadrant abutting the right left hepatic lobes. There is persistent complex free fluid along the margin of the right hepatic lobe. There are new loculated fluid collections in the abdomen. For example there is a collection in the left lower quadrant measuring approximately 16 by 3.8 cm that now appears well formed with a developing rind. There is an additional loculated appearing collection in the right mid abdomen measuring 8.7 cm. This collection likely is contiguous with the dominant collection in the left lower quadrant. Musculoskeletal. No acute displaced fractures. IMPRESSION: 1. Unchanged positioning of the previously demonstrated percutaneous strain. There is a persistent small air and fluid collection which has improved since the prior study. There is a small amount of extraluminal oral contrast adjacent to the drain consistent with a persistent perforation or fistula. 2. Organizing fluid collections in the abdomen concerning for infected ascites/developing abscesses. 3. Persistent extraluminal oral contrast in the upper abdomen, not substantially changed from prior study. 4. Sigmoid diverticulosis with persistent findings of diverticulitis. There is apparent wall thickening at the splenic flexure of the colon which may represent an additional area of infectious or inflammatory colitis. At this location, ischemic colitis is not excluded. 5. Additional chronic findings as detailed above. 6.  Aortic Atherosclerosis (ICD10-I70.0). Electronically Signed   By: Constance Holster M.D.   On: 06/09/2020 03:42   DG Shoulder Right  Result Date: 06/02/2020 CLINICAL DATA:  Pain status post fall. EXAM: RIGHT SHOULDER - 2+ VIEW COMPARISON:  04/16/2017 FINDINGS: There is an acute, impacted fracture of the right humeral head. There is a probable displaced fracture fragment arising from the humeral head overlapping the glenoid. There is some inferior subluxation of the humeral head with a probable moderate-sized  joint effusion. There is intra-articular debris. There is an acute fracture of the inferior glenoid. IMPRESSION: 1. Acute, impacted and displaced fracture of the right humeral head. 2. Probable small, mildly displaced fracture of the inferior glenoid. 3. Large joint effusion with intra-articular debris. Electronically Signed   By: Constance Holster M.D.   On: 06/02/2020 22:38   PERIPHERAL VASCULAR CATHETERIZATION  Result Date: 05/23/2020 See op note  US Abdomen Limited  Result Date: 06/08/2020 CLINICAL DATA:  Intra-abdominal abscess status post percutaneous drainage. Perforated diverticulitis. EXAM: ULTRASOUND ABDOMEN LIMITED COMPARISON:  CT 05/12/2020 FINDINGS: The percutaneous drainage catheter extending through the pannus into the mid abdomen communicates with the largely decompressed collection adjacent to several loops of bowel measuring roughly 2.3 x 2.4 cm in dimension. This is best appreciated on cine images. Within the left lower quadrant, a bland appearing fluid collection possibly representing loculated ascites has now developed, measuring roughly 13.8 x 7.0 cm in greatest dimension. IMPRESSION: Percutaneous drainage catheter communicates  with a 2.3 cm collection adjacent to several loops of a bowel. Interval development of a new bland appearing collection within the left lower quadrant separate from the percutaneous drainage catheter possibly representing loculated ascites. This would be better assessed with contrast enhanced CT examination. Electronically Signed   By: Fidela Salisbury MD   On: 06/08/2020 21:58   DG Chest Portable 1 View  Result Date: 06/08/2020 CLINICAL DATA:  Fever EXAM: PORTABLE CHEST 1 VIEW COMPARISON:  November 04, 2019, May 06, 2019 FINDINGS: The cardiomediastinal silhouette is unchanged and enlarged in contour.Removal of RIGHT chest CVC. Elevation of the RIGHT hemidiaphragm. No pleural effusion. No pneumothorax. No acute pleuroparenchymal abnormality. Visualized  abdomen is unremarkable. Multilevel degenerative changes of the thoracic spine. IMPRESSION: 1. No acute cardiopulmonary abnormality. Electronically Signed   By: Valentino Saxon MD   On: 06/08/2020 16:39   CT IMAGE GUIDED DRAINAGE BY PERCUTANEOUS CATHETER  Result Date: 05/16/2020 INDICATION: 59 year old female with history of diverticulitis complicated by intra-abdominal abscess with prior lower abdominal drain placement, now malpositioned on recent CT abdomen pelvis. Additionally there are new intra-abdominal fluid collections in the upper abdomen, largest in the left mid abdomen. EXAM: CT GUIDED aspiration OF abdominal fluid collection CT-guided exchange of indwelling abscess drain MEDICATIONS: The patient is currently admitted to the hospital and receiving intravenous antibiotics. The antibiotics were administered within an appropriate time frame prior to the initiation of the procedure. ANESTHESIA/SEDATION: 0 mg IV Versed 50 mcg IV Fentanyl Moderate Sedation Time:  0 The patient was continuously monitored during the procedure by the interventional radiology nurse under my direct supervision. COMPLICATIONS: None immediate. TECHNIQUE: Informed written consent was obtained from the patient after a thorough discussion of the procedural risks, benefits and alternatives. All questions were addressed. Maximal Sterile Barrier Technique was utilized including caps, mask, sterile gowns, sterile gloves, sterile drape, hand hygiene and skin antiseptic. A timeout was performed prior to the initiation of the procedure. PROCEDURE: The left upper quadrant and indwelling lower abdominal drain were prepped with Chlorhexidine in a sterile fashion, and a sterile drape was applied covering the operative fields. A sterile gown and sterile gloves were used for the procedure. Local anesthesia was provided with 1% Lidocaine. 60 gauge trocar needle was directed into the left abdominal fluid collection from an anterior approach.  Aspiration was performed. A sample was collected and sent to lab for culture. Next, the indwelling 12 [place] lower abdominal fluid collection drain was exchanged over an Amplatz wire for a 14 [place] drain. The pigtail 4 shin was locked in the deep part of the fluid collection. The drain was sutured in place and a dressing was applied. FINDINGS: Left abdominal fluid collection yielded approximately 250 mL of serous fluid. The fluid collection was essentially absent after aspiration. Given the non purulent nature of this fluid, no drain was placed. The indwelling lower abdominal drain appears in better position than comparison CT, with the pigtail portion in the fluid collection and the radiopaque marker deep to the rectus she. There was succus draining from around the catheter entry site. Successful exchange with 14 French drain, now position deeper within the fluid collection. IMPRESSION: 1. Left upper abdominal fluid collection yielded serous fluid, therefore no drain was placed. Recommend follow-up fluid culture. 2. Successful upsize and repositioning of the indwelling lower abdominal drain from 12 [place] to 14 [place]. Given feculent output around the drain and within the drain bulb upon completion of the procedure, it is suspected that the draining fluid collection represents  perforation of a diverticulum in the setting of diverticulitis. Electronically Signed   By: Ruthann Cancer MD   On: 05/16/2020 07:49    Microbiology: Recent Results (from the past 240 hour(s))  Respiratory Panel by RT PCR (Flu A&B, Covid) - Nasopharyngeal Swab     Status: None   Collection Time: 06/05/20  1:05 PM   Specimen: Nasopharyngeal Swab  Result Value Ref Range Status   SARS Coronavirus 2 by RT PCR NEGATIVE NEGATIVE Final    Comment: (NOTE) SARS-CoV-2 target nucleic acids are NOT DETECTED.  The SARS-CoV-2 RNA is generally detectable in upper respiratoy specimens during the acute phase of infection. The  lowest concentration of SARS-CoV-2 viral copies this assay can detect is 131 copies/mL. A negative result does not preclude SARS-Cov-2 infection and should not be used as the sole basis for treatment or other patient management decisions. A negative result may occur with  improper specimen collection/handling, submission of specimen other than nasopharyngeal swab, presence of viral mutation(s) within the areas targeted by this assay, and inadequate number of viral copies (<131 copies/mL). A negative result must be combined with clinical observations, patient history, and epidemiological information. The expected result is Negative.  Fact Sheet for Patients:  PinkCheek.be  Fact Sheet for Healthcare Providers:  GravelBags.it  This test is no t yet approved or cleared by the [place] FDA and  has been authorized for detection and/or diagnosis of SARS-CoV-2 by FDA under an Emergency Use Authorization (EUA). This EUA will remain  in effect (meaning this test can be used) for the duration of the COVID-19 declaration under Section 564(b)(1) of the Act, 21 U.S.C. section 360bbb-3(b)(1), unless the authorization is terminated or revoked sooner.     Influenza A by PCR NEGATIVE NEGATIVE Final   Influenza B by PCR NEGATIVE NEGATIVE Final    Comment: (NOTE) The Xpert Xpress SARS-CoV-2/FLU/RSV assay is intended as an aid in  the diagnosis of influenza from Nasopharyngeal swab specimens and  should not be used as a sole basis for treatment. Nasal washings and  aspirates are unacceptable for Xpert Xpress SARS-CoV-2/FLU/RSV  testing.  Fact Sheet for Patients: PinkCheek.be  Fact Sheet for Healthcare Providers: GravelBags.it  This test is not yet approved or cleared by the [place] FDA and  has been authorized for detection and/or diagnosis of SARS-CoV-2 by  FDA under  an Emergency Use Authorization (EUA). This EUA will remain  in effect (meaning this test can be used) for the duration of the  Covid-19 declaration under Section 564(b)(1) of the Act, 21  U.S.C. section 360bbb-3(b)(1), unless the authorization is  terminated or revoked. Performed at [facility], [address] [place], [place] [address]   Blood culture (routine x 2)     Status: None (Preliminary result)   Collection Time: 06/08/20  5:42 PM   Specimen: BLOOD  Result Value Ref Range Status   Specimen Description BLOOD RIGHT HAND  Final   Special Requests   Final    BOTTLES DRAWN AEROBIC AND ANAEROBIC Blood Culture adequate volume   Culture   Final    NO GROWTH 4 DAYS Performed at [facility], [address] [place], [place] [address]    Report Status PENDING  Incomplete  Blood culture (routine x 2)     Status: None (Preliminary result)   Collection Time: 06/08/20  5:42 PM   Specimen: BLOOD  Result Value Ref Range Status   Specimen Description BLOOD R EJ  Final   Special  Requests   Final    BOTTLES DRAWN AEROBIC AND ANAEROBIC Blood Culture adequate volume   Culture   Final    NO GROWTH 4 DAYS Performed at [facility], [address] [place], [place] [address]    Report Status PENDING  Incomplete  Respiratory Panel by RT PCR (Flu A&B, Covid) - Nasopharyngeal Swab     Status: None   Collection Time: 06/10/20  2:28 PM   Specimen: Nasopharyngeal Swab  Result Value Ref Range Status   SARS Coronavirus 2 by RT PCR NEGATIVE NEGATIVE Final    Comment: (NOTE) SARS-CoV-2 target nucleic acids are NOT DETECTED.  The SARS-CoV-2 RNA is generally detectable in upper respiratoy specimens during the acute phase of infection. The lowest concentration of SARS-CoV-2 viral copies this assay can detect is 131 copies/mL. A negative result does not preclude SARS-Cov-2 infection and should not be used as the sole basis for treatment or other patient management  decisions. A negative result may occur with  improper specimen collection/handling, submission of specimen other than nasopharyngeal swab, presence of viral mutation(s) within the areas targeted by this assay, and inadequate number of viral copies (<131 copies/mL). A negative result must be combined with clinical observations, patient history, and epidemiological information. The expected result is Negative.  Fact Sheet for Patients:  PinkCheek.be  Fact Sheet for Healthcare Providers:  GravelBags.it  This test is no t yet approved or cleared by the [place] FDA and  has been authorized for detection and/or diagnosis of SARS-CoV-2 by FDA under an Emergency Use Authorization (EUA). This EUA will remain  in effect (meaning this test can be used) for the duration of the COVID-19 declaration under Section 564(b)(1) of the Act, 21 U.S.C. section 360bbb-3(b)(1), unless the authorization is terminated or revoked sooner.     Influenza A by PCR NEGATIVE NEGATIVE Final   Influenza B by PCR NEGATIVE NEGATIVE Final    Comment: (NOTE) The Xpert Xpress SARS-CoV-2/FLU/RSV assay is intended as an aid in  the diagnosis of influenza from Nasopharyngeal swab specimens and  should not be used as a sole basis for treatment. Nasal washings and  aspirates are unacceptable for Xpert Xpress SARS-CoV-2/FLU/RSV  testing.  Fact Sheet for Patients: PinkCheek.be  Fact Sheet for Healthcare Providers: GravelBags.it  This test is not yet approved or cleared by the [place] FDA and  has been authorized for detection and/or diagnosis of SARS-CoV-2 by  FDA under an Emergency Use Authorization (EUA). This EUA will remain  in effect (meaning this test can be used) for the duration of the  Covid-19 declaration under Section 564(b)(1) of the Act, 21  U.S.C. section 360bbb-3(b)(1), unless the  authorization is  terminated or revoked. Performed at [facility], [address] [place],  [address]      Labs: Basic Metabolic Panel: Recent Labs  Lab 06/08/20 1653 06/09/20 0441 06/10/20 1431 06/11/20 1110 06/12/20 0616  NA 138 136 135 135 136  K 2.3* 2.9* 2.8* 2.6* 4.1  CL 102 102 102 101 105  CO2 26 24 23 24 24   GLUCOSE 80 67* 69* 68* 74  BUN 22* 21* 20 18 14   CREATININE 1.10* 1.10* 1.10* 0.96 0.91  CALCIUM 6.6* 6.5* 6.8* 6.9* 6.9*  MG 1.0*  --  1.4* 1.6* 2.0   Liver Function Tests: Recent Labs  Lab 06/05/20 1305 06/11/20 1152  AST 27 25  ALT 19 13  ALKPHOS 96 99  BILITOT 1.0 0.9  PROT 5.9* 5.5*  ALBUMIN  1.2* 1.2*   Recent Labs  Lab 06/11/20 1152  LIPASE 26   No results for input(s): AMMONIA in the last 168 hours. CBC: Recent Labs  Lab 06/05/20 1522 06/08/20 1653 06/09/20 0441 06/10/20 1431  WBC 13.3* 9.7 9.6 9.4  NEUTROABS 7.8* 5.7  --   --   HGB 6.9* 8.8* 8.0* 8.6*  HCT 20.6* 26.2* 24.5* 26.4*  MCV 87.7 88.5 90.1 89.5  PLT 243 363 372 438*   Cardiac Enzymes: No results for input(s): CKTOTAL, CKMB, CKMBINDEX, TROPONINI in the last 168 hours. BNP: BNP (last 3 results) Recent Labs    11/04/19 0821  BNP 28.0    ProBNP (last 3 results) No results for input(s): PROBNP in the last 8760 hours.  CBG: Recent Labs  Lab 06/05/20 2333 06/06/20 0641 06/06/20 1751 06/11/20 1447 06/12/20 0107  GLUCAP 96 76 84 56* 83       Signed:  Kayleen Memos, MD [facility] 06/12/2020, 10:51 AM

## 2020-06-12 NOTE — ED Notes (Signed)
Called lab for morning draw

## 2020-06-12 NOTE — ED Notes (Signed)
Pt with another loose stool immediately after clean up

## 2020-06-12 NOTE — ED Notes (Signed)
PT resting in bed. Morning labs have been collected by lab tech.

## 2020-06-12 NOTE — TOC Progression Note (Signed)
Transition of Care [facility]) - Progression Note    Patient Details  Name: [name] MRN: [id number] Date of Birth: 1961/08/19  Transition of Care [facility]) CM/SW [place], RN Phone Number: 06/12/2020, 9:48 AM  Clinical Narrative:    Spoke with patient and family.  Patient has is agreeable to [facility] for Rehab.  Auth started and received this morning.  Facility is aware.  Waiting for open bed confirmation.  DC summary, FL2, therapy notes and Snf report sent to facility through [facility].     Expected Discharge Plan: [place] Barriers to Discharge: Continued Medical Work up  Expected Discharge Plan and Services Expected Discharge Plan: [place] In-house Referral: Clinical Social Work Discharge Planning Services: CM Consult   Living arrangements for the past 2 months: Single Family Home Expected Discharge Date: 06/12/20                                     Social Determinants of Health (SDOH) Interventions    Readmission Risk Interventions Readmission Risk Prevention Plan 05/23/2020 11/08/2019  Transportation Screening Complete Complete  Medication Review Press photographer) Complete Complete  PCP or Specialist appointment within 3-5 days of discharge (No Data) Not Complete  PCP/Specialist Appt Not Complete comments - Her PCP is out for 2-3 weeks so other two providers are booked out. The receptionist will call her if there is a cancellation.  [place] or Home Care Consult (No Data) Complete  SW Recovery Care/Counseling Consult Complete Complete  Palliative Care Screening Complete Not Applicable  Skilled Nursing Facility Complete Not Applicable  Some recent data might be hidden

## 2020-06-12 NOTE — ED Notes (Signed)
PT cleansed of loose stool. New brief given. Towel between legs per pt request

## 2020-06-12 NOTE — ED Notes (Signed)
Pt resting. Even respirations.

## 2020-06-12 NOTE — Progress Notes (Signed)
Antimicrobial Stewardship - Drug-Drug interaction  Patient noted to be on dolutegravir and rilpivirine (to make Juluca).  Rilpivirine can not be used with proton pump inhibitors (contraindication as acid suppression will decrease rilpivirine levels).  Per recent note on 10/8,  Pharmacy and ID agreed to change to biktarvy to avoid interaction.    Plan: - Discussed with Hospitalist, change to biktarvy as per recent plan to avoid significant drug interaction   Doreene Eland, PharmD, BCPS.   Work Cell: [phone] 06/12/2020 10:17 AM

## 2020-06-12 NOTE — ED Notes (Signed)
Pt resting in bed.

## 2020-06-13 LAB — CULTURE, BLOOD (ROUTINE X 2)
Culture: NO GROWTH
Culture: NO GROWTH
Special Requests: ADEQUATE
Special Requests: ADEQUATE

## 2020-06-17 ENCOUNTER — Other Ambulatory Visit: Payer: Self-pay

## 2020-06-17 ENCOUNTER — Ambulatory Visit (INDEPENDENT_AMBULATORY_CARE_PROVIDER_SITE_OTHER): Payer: Medicare Other | Admitting: Surgery

## 2020-06-17 ENCOUNTER — Encounter: Payer: Self-pay | Admitting: Surgery

## 2020-06-17 DIAGNOSIS — K5720 Diverticulitis of large intestine with perforation and abscess without bleeding: Secondary | ICD-10-CM

## 2020-06-17 LAB — FUNGUS CULTURE WITH STAIN

## 2020-06-17 LAB — FUNGUS CULTURE RESULT

## 2020-06-17 LAB — FUNGAL ORGANISM REFLEX

## 2020-06-17 NOTE — Progress Notes (Signed)
Surgical Clinic Progress/Follow-up Note   HPI:  59 y.o. Female presents to clinic for colocutaneous fistula follow-up.   Weeks follow the last evaluation. Patient reports improvement/resolution of prior issues and has been tolerating regular diet with +flatus and BM's, denies N/V, fever/chills, CP, or SOB.  She broke her right humerus falling out of the bed at the rehab facility.  And she questions today why she cannot walk.  Think she fully appreciates the degree of disability this whole illness is brought upon her.  But she is well aware of her drain, and has maintained it and is able to maintain an awareness of it.  The external device that secures it is long gone.  Review of Systems:  Constitutional: denies fever/chills  ENT: denies sore throat, hearing problems  Respiratory: denies shortness of breath, wheezing  Cardiovascular: denies chest pain, palpitations  Gastrointestinal: denies abdominal pain, N/V, or diarrhea Skin: Denies any other rashes or skin discolorations as per interval history  Vital Signs:  There were no vitals taken for this visit.   Physical Exam:  Constitutional:   Examined in her wheelchair due to instability for potential transfer to exam table -- Obese body habitus  -- Awake, alert, and oriented x3,   Pulmonary:  -- No crackles -- Equal breath sounds bilaterally -- Breathing non-labored at rest Cardiovascular:  -- S1, S2 present  -- No pericardial rubs  Gastrointestinal:  -- Soft and non-distended, non-tender/with mild drain in the left lower quadrant tenderness to palpation, no guarding/rebound tenderness --Left lower quadrant drain site without any peri-incisional erythema, or induration.  Securing device no longer present.  Stitches noted.  Reapplied dressing to assist in securing it.  JP bulb noted, consistent with liquid stool. -- No abdominal masses appreciated, pulsatile or otherwise   Musculoskeletal / Integumentary:  -- Wounds or skin  discoloration: None appreciated  -- Extremities: hands and feet warm, no pitting edema    Imaging: No new pertinent imaging available for review   Assessment:  59 y.o. yo Female with a problem list including...  Patient Active Problem List   Diagnosis Date Noted  . Weakness 06/11/2020  . Fever 06/09/2020  . Unspecified atrial fibrillation ([place]) 06/09/2020  . Hypokalemia 06/08/2020  . Pressure injury of skin 05/08/2020  . Goals of care, counseling/discussion   . Dyspnea   . Acute renal failure (ARF) ([place]) 04/16/2020  . Severe sepsis with septic shock ([place]) 04/16/2020  . Hypotension 04/16/2020  . Diverticulosis 04/16/2020  . Acute respiratory distress   . DNR (do not resuscitate) discussion   . Palliative care by specialist   . Abscess of sigmoid colon due to diverticulitis 04/14/2020  . Multifocal pneumonia 11/06/2019  . AKI (acute kidney injury) ([place]) 11/06/2019  . Leukocytosis 11/06/2019  . Diverticulitis of large intestine with abscess without bleeding 01/17/2019  . Morbid (severe) obesity due to excess calories ([place]) 11/26/2018  . Violation of controlled substance agreement 12/05/2017  . Positive urine drug screen (+cocaine) 12/05/2017  . Cervicalgia 12/05/2017  . Drug-seeking behavior 12/05/2017  . Community acquired pneumonia 11/28/2016  . Pneumonia 11/28/2016  . Allergic rhinitis 05/27/2016  . Tobacco abuse counseling 02/11/2016  . Symptomatic anemia   . Lower GI bleed   . Anemia 01/16/2016  . GI bleed 01/10/2016  . GERD (gastroesophageal reflux disease) 01/10/2016  . HTN (hypertension) 01/10/2016  . Depression 01/10/2016  . HIV (human immunodeficiency virus infection) ([place]) 01/10/2016  . Lumbar pseudoarthrosis 10/31/2015  . Chronic cough   . Cough 06/19/2015  .  DDD (degenerative disc disease), lumbar 12/31/2014  . Degenerative disc disease, lumbar 12/31/2014  . 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  I do not believe we have come to a point where elective surgery should be entertained though.  Plan:   -Follow-up catheter change/fistula evaluation via [facility] interventional radiology.             - return to clinic a month, or as needed, instructed to call office if any questions or concerns  All of the above recommendations were discussed with the patient and patient's family, and all of patient's and family's questions were answered to their expressed satisfaction.  Ronny Bacon, MD, FACS [place]: [place] for exceptional care. Office: [phone]

## 2020-06-17 NOTE — Patient Instructions (Signed)
We will schedule you an appointment for you to see Interventional Radiology.  Once we have that appointment we will schedule a follow up visit with Dr Christian Mate.   Call the office if you have any questions or concerns.

## 2020-06-20 ENCOUNTER — Telehealth: Payer: Self-pay | Admitting: Emergency Medicine

## 2020-06-20 ENCOUNTER — Other Ambulatory Visit: Payer: Self-pay | Admitting: Surgery

## 2020-06-20 NOTE — Telephone Encounter (Signed)
Still waiting on call back from Special Services in regards to drain tube exchange appt.

## 2020-06-20 NOTE — Telephone Encounter (Signed)
Marcie Bal called back and stated that the PA that works with [facility] recommends sending patient to see the drain clinic in [place]. Number to call is [phone] (vicky).  Called Vicky at the drain clinic with no answer. Left vm to call the office back so we can get patient scheduled for a drain tube exchange.

## 2020-06-23 ENCOUNTER — Other Ambulatory Visit (HOSPITAL_COMMUNITY): Payer: Self-pay | Admitting: Surgery

## 2020-06-23 ENCOUNTER — Telehealth (HOSPITAL_COMMUNITY): Payer: Self-pay | Admitting: Radiology

## 2020-06-23 DIAGNOSIS — K5720 Diverticulitis of large intestine with perforation and abscess without bleeding: Secondary | ICD-10-CM

## 2020-06-23 NOTE — Telephone Encounter (Signed)
Spoke to [facility] with [facility]. Per Vicky patient needs to have this done at [facility]. Vicky states she will contact Anderson Malta at [place] to get this scheduled. Per Coralie Common will contact patient.   Called daughter and made her aware of this. Advised daughter f/u appt for tomorrow is cancelled and we will reschedule patient after she has drain tube exchange to come in to see Dr Christian Mate. Pt voiced understanding.

## 2020-06-23 NOTE — Telephone Encounter (Signed)
Called pt to schedule drain exchange. No answer and no VM. JM

## 2020-06-24 ENCOUNTER — Ambulatory Visit: Payer: Medicare Other | Admitting: Surgery

## 2020-06-24 ENCOUNTER — Telehealth: Payer: Self-pay | Admitting: Emergency Medicine

## 2020-06-24 NOTE — Telephone Encounter (Signed)
I called daughter to advise that Anderson Malta has been trying to reach mom in regards to scheduling tube exchange. Daughter wants Anderson Malta to contact her.  I then called Anderson Malta and made her aware. She will contact daughter.
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
## 2020-08-28 ENCOUNTER — Encounter: Payer: Self-pay | Admitting: Surgery

## 2020-08-28 ENCOUNTER — Ambulatory Visit
Admission: RE | Admit: 2020-08-28 | Discharge: 2020-08-28 | Disposition: A | Discharge status: Home / Self Care | Payer: Medicare Other | Source: Ambulatory Visit | Attending: Family | Admitting: Family

## 2020-08-28 ENCOUNTER — Other Ambulatory Visit: Payer: Self-pay | Admitting: Family

## 2020-08-28 DIAGNOSIS — R051 Acute cough: Secondary | ICD-10-CM | POA: Diagnosis not present

## 2020-09-01 ENCOUNTER — Telehealth: Payer: Self-pay

## 2020-09-01 NOTE — Telephone Encounter (Signed)
I spoke with pt about her mychart message and advised her to call the surgeon about her follow up appt in [facility] because we did not do the surgery for her colostomy bag. Pt verbalizes understanding and will have daughter to call them.

## 2020-10-17 ENCOUNTER — Emergency Department
Admission: EM | Admit: 2020-10-17 | Discharge: 2020-10-17 | Discharge status: Against Medical Advice | Payer: Medicare Other

## 2020-10-17 NOTE — ED Notes (Signed)
First RN note:  Pt comes into the ED via [facility] from home c/o the seal of her colostomy bag is leaking x multiple days.  Pt ambulatory.  144/89, 96% RA, 92 HR, 98.2 oral.  Pt also c/o productive cough.

## 2020-10-17 NOTE — ED Notes (Signed)
Pt called to be triaged x1.  Pt refusing to be triaged at this time d/t not wanting to wait for a room.  Pt actively on the phone with family to pick her up.

## 2020-10-17 NOTE — ED Notes (Addendum)
This RN went to lobby and called pt for triage. Pt asked if she was being taken to a room or was coming back to the lobby. RN informed pt that she would be coming back to the lobby. Pt stated that she was going to call her family to come pick her up because she was going to have to wait. Pt states "that's why I hate this fucking hospital, I have shit coming out of my bag and yall are going to make me wait". Pt called family member to come pick her up. Pt was placed back in lobby untriaged.

## 2020-10-17 NOTE — ED Notes (Signed)
Pt informed she has a few people ahead of her to be triaged.  Pt's family wheeled patient out seeing that she does not want to stay and patient screamed through the lobby "Fuck you".  Pt to be removed off of the board at this time.

## 2020-10-30 ENCOUNTER — Emergency Department
Admission: EM | Admit: 2020-10-30 | Discharge: 2020-10-30 | Disposition: A | Discharge status: Home / Self Care | Payer: Medicare Other | Attending: Emergency Medicine | Admitting: Emergency Medicine

## 2020-10-30 ENCOUNTER — Other Ambulatory Visit: Payer: Self-pay

## 2020-10-30 ENCOUNTER — Emergency Department: Payer: Medicare Other

## 2020-10-30 DIAGNOSIS — K7210 Chronic hepatic failure without coma: Secondary | ICD-10-CM

## 2020-10-30 DIAGNOSIS — I10 Essential (primary) hypertension: Secondary | ICD-10-CM | POA: Insufficient documentation

## 2020-10-30 DIAGNOSIS — J449 Chronic obstructive pulmonary disease, unspecified: Secondary | ICD-10-CM | POA: Insufficient documentation

## 2020-10-30 DIAGNOSIS — L89153 Pressure ulcer of sacral region, stage 3: Secondary | ICD-10-CM | POA: Insufficient documentation

## 2020-10-30 DIAGNOSIS — I4891 Unspecified atrial fibrillation: Secondary | ICD-10-CM | POA: Insufficient documentation

## 2020-10-30 DIAGNOSIS — Z79899 Other long term (current) drug therapy: Secondary | ICD-10-CM | POA: Diagnosis not present

## 2020-10-30 DIAGNOSIS — Z96651 Presence of right artificial knee joint: Secondary | ICD-10-CM | POA: Insufficient documentation

## 2020-10-30 DIAGNOSIS — M79602 Pain in left arm: Secondary | ICD-10-CM | POA: Diagnosis present

## 2020-10-30 DIAGNOSIS — F17210 Nicotine dependence, cigarettes, uncomplicated: Secondary | ICD-10-CM | POA: Diagnosis not present

## 2020-10-30 DIAGNOSIS — B20 Human immunodeficiency virus [HIV] disease: Secondary | ICD-10-CM | POA: Insufficient documentation

## 2020-10-30 LAB — CBC WITH DIFFERENTIAL/PLATELET
Abs Immature Granulocytes: 0 10*3/uL (ref 0.00–0.07)
Basophils Absolute: 0.1 10*3/uL (ref 0.0–0.1)
Basophils Relative: 1 %
Eosinophils Absolute: 0.4 10*3/uL (ref 0.0–0.5)
Eosinophils Relative: 8 %
HCT: 31.4 % — ABNORMAL LOW (ref 36.0–46.0)
Hemoglobin: 10.4 g/dL — ABNORMAL LOW (ref 12.0–15.0)
Immature Granulocytes: 0 %
Lymphocytes Relative: 34 %
Lymphs Abs: 1.8 10*3/uL (ref 0.7–4.0)
MCH: 31.9 pg (ref 26.0–34.0)
MCHC: 33.1 g/dL (ref 30.0–36.0)
MCV: 96.3 fL (ref 80.0–100.0)
Monocytes Absolute: 0.8 10*3/uL (ref 0.1–1.0)
Monocytes Relative: 14 %
Neutro Abs: 2.3 10*3/uL (ref 1.7–7.7)
Neutrophils Relative %: 43 %
Platelets: 332 10*3/uL (ref 150–400)
RBC: 3.26 MIL/uL — ABNORMAL LOW (ref 3.87–5.11)
RDW: 16.3 % — ABNORMAL HIGH (ref 11.5–15.5)
WBC: 5.4 10*3/uL (ref 4.0–10.5)
nRBC: 0 % (ref 0.0–0.2)

## 2020-10-30 LAB — COMPREHENSIVE METABOLIC PANEL
ALT: 15 U/L (ref 0–44)
AST: 32 U/L (ref 15–41)
Albumin: 2.7 g/dL — ABNORMAL LOW (ref 3.5–5.0)
Alkaline Phosphatase: 88 U/L (ref 38–126)
Anion gap: 6 (ref 5–15)
BUN: 27 mg/dL — ABNORMAL HIGH (ref 6–20)
CO2: 21 mmol/L — ABNORMAL LOW (ref 22–32)
Calcium: 9 mg/dL (ref 8.9–10.3)
Chloride: 106 mmol/L (ref 98–111)
Creatinine, Ser: 1.39 mg/dL — ABNORMAL HIGH (ref 0.44–1.00)
GFR, Estimated: 44 mL/min — ABNORMAL LOW (ref >60)
Glucose, Bld: 84 mg/dL (ref 70–99)
Potassium: 3.8 mmol/L (ref 3.5–5.1)
Sodium: 133 mmol/L — ABNORMAL LOW (ref 135–145)
Total Bilirubin: 0.7 mg/dL (ref 0.3–1.2)
Total Protein: 7 g/dL (ref 6.5–8.1)

## 2020-10-30 LAB — PHOSPHORUS: Phosphorus: 3.7 mg/dL (ref 2.5–4.6)

## 2020-10-30 LAB — MAGNESIUM: Magnesium: 1.6 mg/dL — ABNORMAL LOW (ref 1.7–2.4)

## 2020-10-30 MED ORDER — OXYCODONE HCL 5 MG PO TABS
5.0000 mg | ORAL_TABLET | ORAL | Status: AC
  Administered 2020-10-30: 5 mg via ORAL
  Filled 2020-10-30: qty 1

## 2020-10-30 MED ORDER — ONDANSETRON 4 MG PO TBDP
ORAL_TABLET | ORAL | Status: AC
  Administered 2020-10-30: 4 mg via ORAL
  Filled 2020-10-30: qty 1

## 2020-10-30 MED ORDER — ONDANSETRON 4 MG PO TBDP: 4.0000 mg | ORAL_TABLET | Freq: Once | ORAL | Status: AC

## 2020-10-30 NOTE — Discharge Instructions (Addendum)
Your Magnesium level was slightly low and your labs showed some signs of dehydration. Be sure to eat regularly and increase your fluid intake.    Results for orders placed or performed during the hospital encounter of 10/30/20  Comprehensive metabolic panel  Result Value Ref Range   Sodium 133 (L) 135 - 145 mmol/L   Potassium 3.8 3.5 - 5.1 mmol/L   Chloride 106 98 - 111 mmol/L   CO2 21 (L) 22 - 32 mmol/L   Glucose, Bld 84 70 - 99 mg/dL   BUN 27 (H) 6 - 20 mg/dL   Creatinine, Ser 1.39 (H) 0.44 - 1.00 mg/dL   Calcium 9.0 8.9 - 10.3 mg/dL   Total Protein 7.0 6.5 - 8.1 g/dL   Albumin 2.7 (L) 3.5 - 5.0 g/dL   AST 32 15 - 41 U/L   ALT 15 0 - 44 U/L   Alkaline Phosphatase 88 38 - 126 U/L   Total Bilirubin 0.7 0.3 - 1.2 mg/dL   GFR, Estimated 44 (L) >60 mL/min   Anion gap 6 5 - 15  CBC with Differential  Result Value Ref Range   WBC 5.4 4.0 - 10.5 K/uL   RBC 3.26 (L) 3.87 - 5.11 MIL/uL   Hemoglobin 10.4 (L) 12.0 - 15.0 g/dL   HCT 31.4 (L) 36.0 - 46.0 %   MCV 96.3 80.0 - 100.0 fL   MCH 31.9 26.0 - 34.0 pg   MCHC 33.1 30.0 - 36.0 g/dL   RDW 16.3 (H) 11.5 - 15.5 %   Platelets 332 150 - 400 K/uL   nRBC 0.0 0.0 - 0.2 %   Neutrophils Relative % 43 %   Neutro Abs 2.3 1.7 - 7.7 K/uL   Lymphocytes Relative 34 %   Lymphs Abs 1.8 0.7 - 4.0 K/uL   Monocytes Relative 14 %   Monocytes Absolute 0.8 0.1 - 1.0 K/uL   Eosinophils Relative 8 %   Eosinophils Absolute 0.4 0.0 - 0.5 K/uL   Basophils Relative 1 %   Basophils Absolute 0.1 0.0 - 0.1 K/uL   Immature Granulocytes 0 %   Abs Immature Granulocytes 0.00 0.00 - 0.07 K/uL  Magnesium  Result Value Ref Range   Magnesium 1.6 (L) 1.7 - 2.4 mg/dL  Phosphorus  Result Value Ref Range   Phosphorus 3.7 2.5 - 4.6 mg/dL   US Venous Img Upper Uni Left  Result Date: 10/30/2020 CLINICAL DATA:  Left arm pain history of PICC EXAM: Left UPPER EXTREMITY VENOUS DOPPLER ULTRASOUND TECHNIQUE: Gray-scale sonography with graded compression, as well as color  Doppler and duplex ultrasound were performed to evaluate the upper extremity deep venous system from the level of the subclavian vein and including the jugular, axillary, basilic, radial, ulnar and upper cephalic vein. Spectral Doppler was utilized to evaluate flow at rest and with distal augmentation maneuvers. COMPARISON:  None. FINDINGS: Contralateral Subclavian Vein: Respiratory phasicity is normal and symmetric with the symptomatic side. No evidence of thrombus. Normal compressibility. Internal Jugular Vein: No evidence of thrombus. Normal compressibility, respiratory phasicity and response to augmentation. Subclavian Vein: No evidence of thrombus. Normal compressibility, respiratory phasicity and response to augmentation. Axillary Vein: No evidence of thrombus. Normal compressibility, respiratory phasicity and response to augmentation. Cephalic Vein: No evidence of thrombus. Normal compressibility, respiratory phasicity and response to augmentation. Basilic Vein: No evidence of thrombus. Normal compressibility, respiratory phasicity and response to augmentation. Brachial Veins: No evidence of thrombus. Normal compressibility, respiratory phasicity and response to augmentation. Radial Veins: No evidence  of thrombus.  Normal compressibility. Ulnar Veins: No evidence of thrombus.  Normal compressibility. IMPRESSION: No evidence of DVT within the left upper extremity. Electronically Signed   By: Donavan Foil M.D.   On: 10/30/2020 19:34

## 2020-10-30 NOTE — ED Provider Notes (Signed)
[facility] Emergency Department Provider Note  ____________________________________________  Time seen: Approximately 7:52 PM  I have reviewed the triage vital signs and the nursing notes.   HISTORY  Chief Complaint blood clot    HPI Melanie Bowen is a 60 y.o. female with a history of COPD, depression, GERD, HIV, hypertension who was sent to the ED by her outpatient care team for an ultrasound of the left arm and some labs.  Patient has a recent history of diverticulitis status post partial colectomy with a colostomy.  She has a PICC in her left upper extremity for TPN .   Today they were unable to draw blood from the PICC, and therefore determined that she needed to come to the ED before she could have any further TPN as well.  Patient does complain of swelling and pain in the left upper arm which has been ongoing for several weeks.  Symptoms are waxing waning, worse with movement, no alleviating factors.  Moderate intensity.  No chest pain or shortness of breath.  No fever.     Past Medical History:  Diagnosis Date  . Acute GI hemorrhage 12/2015   required transfusion  . Allergic rhinitis   . Arthritis    DDD  . Asthma   . Blepharitis   . Bronchitis   . Cigarette smoker   . Claustrophobia   . COPD (chronic obstructive pulmonary disease) ([place])   . Cough   . DDD (degenerative disc disease), lumbar 04/10/2014  . Depression   . Diverticulosis   . Ectopic pregnancy   . Eye irritation   . Genital herpes   . GERD (gastroesophageal reflux disease)   . Headache   . HIV (human immunodeficiency virus infection) ([place])   . Hypertension   . Paresthesia of hand, bilateral   . Shortness of breath dyspnea    at times due to asthma     Patient Active Problem List   Diagnosis Date Noted  . Pressure injury of sacral region, stage 3 ([place]) 10/30/2020  . Chronic liver failure without hepatic coma ([place]) 10/30/2020  . Weakness 06/11/2020  . Fever  06/09/2020  . Unspecified atrial fibrillation ([place]) 06/09/2020  . Hypokalemia 06/08/2020  . Pressure injury of skin 05/08/2020  . Goals of care, counseling/discussion   . Dyspnea   . Acute renal failure (ARF) ([place]) 04/16/2020  . Severe sepsis with septic shock ([place]) 04/16/2020  . Hypotension 04/16/2020  . Diverticulosis 04/16/2020  . Acute respiratory distress   . DNR (do not resuscitate) discussion   . Palliative care by specialist   . Abscess of sigmoid colon due to diverticulitis 04/14/2020  . Multifocal pneumonia 11/06/2019  . AKI (acute kidney injury) ([place]) 11/06/2019  . Leukocytosis 11/06/2019  . Diverticulitis of large intestine with abscess without bleeding 01/17/2019  . Morbid (severe) obesity due to excess calories ([place]) 11/26/2018  . Violation of controlled substance agreement 12/05/2017  . Positive urine drug screen (+cocaine) 12/05/2017  . Cervicalgia 12/05/2017  . Drug-seeking behavior 12/05/2017  . Community acquired pneumonia 11/28/2016  . Pneumonia 11/28/2016  . Allergic rhinitis 05/27/2016  . Tobacco abuse counseling 02/11/2016  . Symptomatic anemia   . Lower GI bleed   . Anemia 01/16/2016  . GI bleed 01/10/2016  . GERD (gastroesophageal reflux disease) 01/10/2016  . HTN (hypertension) 01/10/2016  . Depression 01/10/2016  . HIV (human immunodeficiency virus infection) ([place]) 01/10/2016  . Lumbar pseudoarthrosis 10/31/2015  . Chronic cough   . Cough 06/19/2015  . DDD (  degenerative disc disease), lumbar 12/31/2014  . Degenerative disc disease, lumbar 12/31/2014  . Asthma, chronic 11/12/2014     Past Surgical History:  Procedure Laterality Date  . BACK SURGERY  12/2014   lumbar lam. Dr. Deri Fuelling  . BACK SURGERY  12/25/2012   Removal lumbosubarachnoid shunt system, L5-S1 TF ESI, Dr. Virl Axe Minchew  . back surgery may 2016     lumbar   . BUNIONECTOMY Bilateral    feet  . COLONOSCOPY  12/06/2014   rescheduled from 11/01/2014 due to poor prep  .  DIAGNOSTIC LAPAROSCOPY    . DIALYSIS/PERMA CATHETER INSERTION N/A 04/25/2020   Procedure: DIALYSIS/PERMA CATHETER INSERTION;  Surgeon: Algernon Huxley, MD;  Location: [place] [facility];  Service: Cardiovascular;  Laterality: N/A;  . DIALYSIS/PERMA CATHETER REMOVAL N/A 05/23/2020   Procedure: DIALYSIS/PERMA CATHETER REMOVAL;  Surgeon: Algernon Huxley, MD;  Location: [place] [facility];  Service: Cardiovascular;  Laterality: N/A;  . DISTAL FEMUR OSTEOTOMY Right    Dr. Earnestine Leys  . ESOPHAGOGASTRODUODENOSCOPY Left 01/11/2016   Procedure: ESOPHAGOGASTRODUODENOSCOPY (EGD);  Surgeon: Hulen Luster, MD;  Location: [facility] ENDOSCOPY;  Service: Endoscopy;  Laterality: Left;  . Finger fracture Right    5th finger  . FRACTURE SURGERY Right 01/25/2014   closed reduction & percutaneous pinning of 5th digit  . HAND SURGERY Bilateral    carpal tunnel  . JOINT REPLACEMENT Bilateral    knees  . KNEE SURGERY Bilateral 2003,2007   total Joint  . LAPAROSCOPY FOR ECTOPIC PREGNANCY    . PERIPHERAL VASCULAR CATHETERIZATION N/A 01/12/2016   Procedure: Visceral Angiography;  Surgeon: Algernon Huxley, MD;  Location: [place] [facility];  Service: Cardiovascular;  Laterality: N/A;  . PERIPHERAL VASCULAR CATHETERIZATION N/A 01/12/2016   Procedure: Visceral Artery Intervention;  Surgeon: Algernon Huxley, MD;  Location: [place] [facility];  Service: Cardiovascular;  Laterality: N/A;  . REVISION TOTAL KNEE ARTHROPLASTY Right 12/31/2002   Dr. Marry Guan  . TUBAL LIGATION    . VIDEO BRONCHOSCOPY N/A 06/30/2015   Procedure: VIDEO BRONCHOSCOPY WITHOUT FLUORO;  Surgeon: Vilinda Boehringer, MD;  Location: [facility] ORS;  Service: Cardiopulmonary;  Laterality: N/A;     Prior to Admission medications   Medication Sig Start Date End Date Taking? Authorizing Provider  albuterol (VENTOLIN HFA) 108 (90 Base) MCG/ACT inhaler Inhale 2 puffs into the lungs every 6 (six) hours as needed for wheezing or shortness of breath. 05/21/19   Laban Emperor, PA-C   amiodarone (PACERONE) 200 MG tablet Take 1 tablet (200 mg total) by mouth daily for 21 days. 06/12/20 07/03/20  Kayleen Memos, DO  ascorbic acid (VITAMIN C) 500 MG tablet Take 500 mg by mouth 2 (two) times daily.    [provider]  DEXILANT 60 MG capsule Take 60 mg by mouth daily.  04/25/19   [provider]  famotidine (PEPCID) 20 MG tablet Take 20 mg by mouth daily.  05/08/19   [provider]  FLUoxetine (PROZAC) 20 MG capsule Take 20 mg by mouth daily.    [provider]  Multiple Vitamin (MULTIVITAMIN WITH MINERALS) TABS tablet Take 1 tablet by mouth daily.    [provider]  potassium chloride SA (KLOR-CON) 10 MEQ tablet Take 1 tablet (10 mEq total) by mouth daily for 7 days. 06/12/20 06/19/20  Kayleen Memos, DO  WIXELA INHUB 250-50 MCG/DOSE AEPB Inhale 1 puff into the lungs 2 (two) times daily. 11/05/19   [provider]  Allergies Acetaminophen, Ibuprofen, Gabapentin, Morphine, Morphine and related, and Zanaflex  [tizanidine]   Family History  Problem Relation Age of Onset  . Breast cancer Sister     Social History Social History   Tobacco Use  . Smoking status: Current Some Day Smoker    Packs/day: 0.25    Years: 38.00    Pack years: 9.50    Types: Cigarettes  . Smokeless tobacco: Never Used  . Tobacco comment: 1 cig daily  Vaping Use  . Vaping Use: Never used  Substance Use Topics  . Alcohol use: Yes    Alcohol/week: 5.0 standard drinks    Types: 5 Glasses of wine per week    Comment: occ  . Drug use: Not Currently    Review of Systems  Constitutional:   No fever or chills.  ENT:   No sore throat. No rhinorrhea. Cardiovascular:   No chest pain or syncope. Respiratory:   No dyspnea or cough. Gastrointestinal:   Negative for abdominal pain, vomiting and diarrhea.  Musculoskeletal: Positive left arm pain and swelling All other systems reviewed and are negative except as documented above in ROS and  HPI.  ____________________________________________   PHYSICAL EXAM:  VITAL SIGNS: ED Triage Vitals  Enc Vitals Group     BP 10/30/20 1807 134/71     Pulse Rate 10/30/20 1807 98     Resp 10/30/20 1807 18     Temp 10/30/20 1807 99 F (37.2 C)     Temp Source 10/30/20 1807 Oral     SpO2 10/30/20 1807 96 %     Weight 10/30/20 1810 201 lb (91.2 kg)     Height 10/30/20 1810 '5\' 6"'$  (1.676 m)     Head Circumference --      Peak Flow --      Pain Score 10/30/20 1809 10     Pain Loc --      Pain Edu? --      Excl. in [place]? --     Vital signs reviewed, nursing assessments reviewed.   Constitutional:   Alert and oriented. Non-toxic appearance. Eyes:   Conjunctivae are normal. EOMI. PERRL. ENT      Head:   Normocephalic and atraumatic.      Nose:   Wearing a mask.      Mouth/Throat:   Wearing a mask.      Neck:   No meningismus. Full ROM. Hematological/Lymphatic/Immunilogical:   No cervical lymphadenopathy. Cardiovascular:   RRR. Symmetric bilateral radial and DP pulses.  No murmurs. Cap refill less than 2 seconds. Respiratory:   Normal respiratory effort without tachypnea/retractions. Breath sounds are clear and equal bilaterally. No wheezes/rales/rhonchi. Gastrointestinal:   Soft and nontender. Non distended.   No rebound, rigidity, or guarding.  Musculoskeletal:   Normal range of motion in all extremities. No joint effusions.  No lower extremity tenderness.  No edema.  Compartments are soft.  No ecchymosis or erythema.  There is some tenderness in the left upper arm, though arm circumference is equal on both sides. Neurologic:   Normal speech and language.  Motor grossly intact. No acute focal neurologic deficits are appreciated.  Skin:    Skin is warm, dry and intact. No rash noted.  No petechiae, purpura, or bullae.  ____________________________________________    LABS (pertinent positives/negatives) (all labs ordered are listed, but only abnormal results are  displayed) Labs Reviewed  COMPREHENSIVE METABOLIC PANEL - Abnormal; Notable for the following components:      Result Value  Sodium 133 (*)    CO2 21 (*)    BUN 27 (*)    Creatinine, Ser 1.39 (*)    Albumin 2.7 (*)    GFR, Estimated 44 (*)    All other components within normal limits  CBC WITH DIFFERENTIAL/PLATELET - Abnormal; Notable for the following components:   RBC 3.26 (*)    Hemoglobin 10.4 (*)    HCT 31.4 (*)    RDW 16.3 (*)    All other components within normal limits  MAGNESIUM - Abnormal; Notable for the following components:   Magnesium 1.6 (*)    All other components within normal limits  PHOSPHORUS   ____________________________________________   EKG    ____________________________________________    RADIOLOGY  US Venous Img Upper Uni Left  Result Date: 10/30/2020 CLINICAL DATA:  Left arm pain history of PICC EXAM: Left UPPER EXTREMITY VENOUS DOPPLER ULTRASOUND TECHNIQUE: Gray-scale sonography with graded compression, as well as color Doppler and duplex ultrasound were performed to evaluate the upper extremity deep venous system from the level of the subclavian vein and including the jugular, axillary, basilic, radial, ulnar and upper cephalic vein. Spectral Doppler was utilized to evaluate flow at rest and with distal augmentation maneuvers. COMPARISON:  None. FINDINGS: Contralateral Subclavian Vein: Respiratory phasicity is normal and symmetric with the symptomatic side. No evidence of thrombus. Normal compressibility. Internal Jugular Vein: No evidence of thrombus. Normal compressibility, respiratory phasicity and response to augmentation. Subclavian Vein: No evidence of thrombus. Normal compressibility, respiratory phasicity and response to augmentation. Axillary Vein: No evidence of thrombus. Normal compressibility, respiratory phasicity and response to augmentation. Cephalic Vein: No evidence of thrombus. Normal compressibility, respiratory phasicity and  response to augmentation. Basilic Vein: No evidence of thrombus. Normal compressibility, respiratory phasicity and response to augmentation. Brachial Veins: No evidence of thrombus. Normal compressibility, respiratory phasicity and response to augmentation. Radial Veins: No evidence of thrombus.  Normal compressibility. Ulnar Veins: No evidence of thrombus.  Normal compressibility. IMPRESSION: No evidence of DVT within the left upper extremity. Electronically Signed   By: Donavan Foil M.D.   On: 10/30/2020 19:34    ____________________________________________   PROCEDURES Procedures  ____________________________________________  DIFFERENTIAL DIAGNOSIS   DVT, musculoskeletal pain, dehydration, electrolyte enterotomy  CLINICAL IMPRESSION / ASSESSMENT AND PLAN / ED COURSE  Medications ordered in the ED: Medications  ondansetron (ZOFRAN-ODT) 4 MG disintegrating tablet (has no administration in time range)  ondansetron (ZOFRAN-ODT) disintegrating tablet 4 mg (has no administration in time range)  oxyCODONE (Oxy IR/ROXICODONE) immediate release tablet 5 mg (5 mg Oral Given 10/30/20 1847)    Pertinent labs & imaging results that were available during my care of the patient were reviewed by me and considered in my medical decision making (see chart for details).  Melanie Bowen was evaluated in Emergency Department on 10/30/2020 for the symptoms described in the history of present illness. She was evaluated in the context of the global COVID-19 pandemic, which necessitated consideration that the patient might be at risk for infection with the SARS-CoV-2 virus that causes COVID-19. Institutional protocols and algorithms that pertain to the evaluation of patients at risk for COVID-19 are in a state of rapid change based on information released by regulatory bodies including the CDC and federal and state organizations. These policies and algorithms were followed during the patient's care in the ED.    Patient presents with subacute left arm pain in the setting of TPN via PICC.  Nursing was able to flush the PICC and aspirate  blood from it, seems to be functioning appropriately.  Labs were obtained which shows slight elevation in BUN and creatinine indicative of dehydration related to not having TPN today.  Magnesium also slightly low.  This can be deferred to outpatient follow-up.  Ultrasound negative for DVT no other acute findings.  Vital signs normal, patient is nontoxic and stable for discharge home      ____________________________________________   FINAL CLINICAL IMPRESSION(S) / ED DIAGNOSES    Final diagnoses:  Left arm pain  Pressure injury of sacral region, stage 3 ([facility])  Chronic liver failure without hepatic coma ([facility])  Human immunodeficiency virus (HIV) disease ([facility])  Atrial fibrillation, unspecified type ([place])  Morbid (severe) obesity due to excess calories [facility])     ED Discharge Orders    None      Portions of this note were generated with dragon dictation software. Dictation errors may occur despite best attempts at proofreading.   Carrie Mew, MD 10/30/20 1958

## 2020-10-30 NOTE — ED Notes (Signed)
Patient agitated, states "why the hell ain't no one been in here since I've been in here. I ain't trying to wait all this time just for an ultrasound. These doctors over here don't care about me." MD notified and to bedside.

## 2020-10-30 NOTE — ED Triage Notes (Signed)
Pt has left upper arm PICC line, pt states that no one has been able to draw blood from it. Pt states her left hand hurts as well. Megan RN able to draw blood from PICC line in triage. Pt said she was sent to ER by nurse from [facility] due to left arm and hand swelling. Pt's arm and hand not swollen at present. Color and pulses WNL for left arm

## 2020-11-25 ENCOUNTER — Encounter: Payer: Self-pay | Admitting: *Deleted

## 2020-11-25 ENCOUNTER — Emergency Department
Admission: EM | Admit: 2020-11-25 | Discharge: 2020-11-25 | Disposition: A | Discharge status: Home / Self Care | Payer: Medicare Other | Source: Home / Self Care | Attending: Emergency Medicine | Admitting: Emergency Medicine

## 2020-11-25 ENCOUNTER — Other Ambulatory Visit: Payer: Self-pay

## 2020-11-25 ENCOUNTER — Other Ambulatory Visit: Payer: Self-pay | Admitting: Family Medicine

## 2020-11-25 DIAGNOSIS — J449 Chronic obstructive pulmonary disease, unspecified: Secondary | ICD-10-CM | POA: Insufficient documentation

## 2020-11-25 DIAGNOSIS — Z21 Asymptomatic human immunodeficiency virus [HIV] infection status: Secondary | ICD-10-CM | POA: Insufficient documentation

## 2020-11-25 DIAGNOSIS — T80211A Bloodstream infection due to central venous catheter, initial encounter: Secondary | ICD-10-CM | POA: Diagnosis not present

## 2020-11-25 DIAGNOSIS — J45909 Unspecified asthma, uncomplicated: Secondary | ICD-10-CM | POA: Insufficient documentation

## 2020-11-25 DIAGNOSIS — Z87891 Personal history of nicotine dependence: Secondary | ICD-10-CM | POA: Insufficient documentation

## 2020-11-25 DIAGNOSIS — Y733 Surgical instruments, materials and gastroenterology and urology devices (including sutures) associated with adverse incidents: Secondary | ICD-10-CM | POA: Insufficient documentation

## 2020-11-25 DIAGNOSIS — I10 Essential (primary) hypertension: Secondary | ICD-10-CM | POA: Insufficient documentation

## 2020-11-25 DIAGNOSIS — Z452 Encounter for adjustment and management of vascular access device: Secondary | ICD-10-CM | POA: Insufficient documentation

## 2020-11-25 DIAGNOSIS — Z95828 Presence of other vascular implants and grafts: Secondary | ICD-10-CM

## 2020-11-25 DIAGNOSIS — T80219A Unspecified infection due to central venous catheter, initial encounter: Secondary | ICD-10-CM | POA: Insufficient documentation

## 2020-11-25 DIAGNOSIS — Z96653 Presence of artificial knee joint, bilateral: Secondary | ICD-10-CM | POA: Insufficient documentation

## 2020-11-25 DIAGNOSIS — R7881 Bacteremia: Secondary | ICD-10-CM | POA: Diagnosis not present

## 2020-11-25 DIAGNOSIS — Z1231 Encounter for screening mammogram for malignant neoplasm of breast: Secondary | ICD-10-CM

## 2020-11-25 LAB — BASIC METABOLIC PANEL
Anion gap: 6 (ref 5–15)
BUN: 29 mg/dL — ABNORMAL HIGH (ref 6–20)
CO2: 20 mmol/L — ABNORMAL LOW (ref 22–32)
Calcium: 8.3 mg/dL — ABNORMAL LOW (ref 8.9–10.3)
Chloride: 107 mmol/L (ref 98–111)
Creatinine, Ser: 1.34 mg/dL — ABNORMAL HIGH (ref 0.44–1.00)
GFR, Estimated: 46 mL/min — ABNORMAL LOW (ref >60)
Glucose, Bld: 107 mg/dL — ABNORMAL HIGH (ref 70–99)
Potassium: 4 mmol/L (ref 3.5–5.1)
Sodium: 133 mmol/L — ABNORMAL LOW (ref 135–145)

## 2020-11-25 LAB — CBC WITH DIFFERENTIAL/PLATELET
Abs Immature Granulocytes: 0.05 10*3/uL (ref 0.00–0.07)
Basophils Absolute: 0 10*3/uL (ref 0.0–0.1)
Basophils Relative: 0 %
Eosinophils Absolute: 0.1 10*3/uL (ref 0.0–0.5)
Eosinophils Relative: 1 %
HCT: 28.3 % — ABNORMAL LOW (ref 36.0–46.0)
Hemoglobin: 9.8 g/dL — ABNORMAL LOW (ref 12.0–15.0)
Immature Granulocytes: 0 %
Lymphocytes Relative: 10 %
Lymphs Abs: 1.3 10*3/uL (ref 0.7–4.0)
MCH: 31 pg (ref 26.0–34.0)
MCHC: 34.6 g/dL (ref 30.0–36.0)
MCV: 89.6 fL (ref 80.0–100.0)
Monocytes Absolute: 1.1 10*3/uL — ABNORMAL HIGH (ref 0.1–1.0)
Monocytes Relative: 9 %
Neutro Abs: 9.8 10*3/uL — ABNORMAL HIGH (ref 1.7–7.7)
Neutrophils Relative %: 80 %
Platelets: 332 10*3/uL (ref 150–400)
RBC: 3.16 MIL/uL — ABNORMAL LOW (ref 3.87–5.11)
RDW: 16.8 % — ABNORMAL HIGH (ref 11.5–15.5)
WBC: 12.3 10*3/uL — ABNORMAL HIGH (ref 4.0–10.5)
nRBC: 0 % (ref 0.0–0.2)

## 2020-11-25 MED ORDER — OXYCODONE HCL 5 MG PO TABS
5.0000 mg | ORAL_TABLET | ORAL | Status: AC
  Administered 2020-11-25: 5 mg via ORAL
  Filled 2020-11-25: qty 1

## 2020-11-25 NOTE — ED Provider Notes (Addendum)
[facility] Emergency Department Provider Note  ____________________________________________   Event Date/Time   First MD Initiated Contact with Patient 11/25/20 1949     (approximate)  I have reviewed the triage vital signs and the nursing notes.   HISTORY  Chief Complaint picc line infection    HPI Melanie Bowen is a 60 y.o. female with a past medical history of COPD, GERD, depression, HIV, HTN, diverticulitis status post partial colectomy with colostomy and left upper extremity PICC line for TPN who presents for assessment referred to the ED because she was told she may have PICC line infection.  It seems this PICC line has not been recently assessed by Dr. But she was complaining of some chills when she was infusing her TPN and she was advised to come see if she can infection.  She denies any new pain in her left arm but does endorse some chronic soreness has been going on for several months.  She denies any new pain anywhere else including headache and earache, sore throat, fevers, chills, chest pain, cough, shortness of breath, new back pain or any other new extremity pain.  No other acute concerns at this time.  She has not had any difficulty actually infusing her TPN.  She states is typically stored infiltrated and that she will bring it to room temperature 1 hour to before infusing it.         Past Medical History:  Diagnosis Date  . Acute GI hemorrhage 12/2015   required transfusion  . Allergic rhinitis   . Arthritis    DDD  . Asthma   . Blepharitis   . Bronchitis   . Cigarette smoker   . Claustrophobia   . COPD (chronic obstructive pulmonary disease) ([place]   . Cough   . DDD (degenerative disc disease), lumbar 04/10/2014  . Depression   . Diverticulosis   . Ectopic pregnancy   . Eye irritation   . Genital herpes   . GERD (gastroesophageal reflux disease)   . Headache   . HIV (human immunodeficiency virus infection) ([place])   .  Hypertension   . Paresthesia of hand, bilateral   . Shortness of breath dyspnea    at times due to asthma    Patient Active Problem List   Diagnosis Date Noted  . Pressure injury of sacral region, stage 3 ([place]) 10/30/2020  . Chronic liver failure without hepatic coma ([place]) 10/30/2020  . Weakness 06/11/2020  . Fever 06/09/2020  . Unspecified atrial fibrillation ([place]) 06/09/2020  . Hypokalemia 06/08/2020  . Pressure injury of skin 05/08/2020  . Goals of care, counseling/discussion   . Dyspnea   . Acute renal failure (ARF) ([place]) 04/16/2020  . Severe sepsis with septic shock ([place]) 04/16/2020  . Hypotension 04/16/2020  . Diverticulosis 04/16/2020  . Acute respiratory distress   . DNR (do not resuscitate) discussion   . Palliative care by specialist   . Abscess of sigmoid colon due to diverticulitis 04/14/2020  . Multifocal pneumonia 11/06/2019  . AKI (acute kidney injury) ([place]) 11/06/2019  . Leukocytosis 11/06/2019  . Diverticulitis of large intestine with abscess without bleeding 01/17/2019  . Morbid (severe) obesity due to excess calories ([place]) 11/26/2018  . Violation of controlled substance agreement 12/05/2017  . Positive urine drug screen (+cocaine) 12/05/2017  . Cervicalgia 12/05/2017  . Drug-seeking behavior 12/05/2017  . Community acquired pneumonia 11/28/2016  . Pneumonia 11/28/2016  . Allergic rhinitis 05/27/2016  . Tobacco abuse counseling 02/11/2016  . Symptomatic  anemia   . Lower GI bleed   . Anemia 01/16/2016  . GI bleed 01/10/2016  . GERD (gastroesophageal reflux disease) 01/10/2016  . HTN (hypertension) 01/10/2016  . Depression 01/10/2016  . HIV (human immunodeficiency virus infection) ([place]) 01/10/2016  . Lumbar pseudoarthrosis 10/31/2015  . Chronic cough   . Cough 06/19/2015  . DDD (degenerative disc disease), lumbar 12/31/2014  . Degenerative disc disease, lumbar 12/31/2014  . Asthma, chronic 11/12/2014    Past Surgical History:  Procedure  Laterality Date  . BACK SURGERY  12/2014   lumbar lam. Dr. Deri Fuelling  . BACK SURGERY  12/25/2012   Removal lumbosubarachnoid shunt system, L5-S1 TF ESI, Dr. Virl Axe Minchew  . back surgery may 2016     lumbar   . BUNIONECTOMY Bilateral    feet  . COLONOSCOPY  12/06/2014   rescheduled from 11/01/2014 due to poor prep  . DIAGNOSTIC LAPAROSCOPY    . DIALYSIS/PERMA CATHETER INSERTION N/A 04/25/2020   Procedure: DIALYSIS/PERMA CATHETER INSERTION;  Surgeon: Algernon Huxley, MD;  Location: [facility];  Service: Cardiovascular;  Laterality: N/A;  . DIALYSIS/PERMA CATHETER REMOVAL N/A 05/23/2020   Procedure: DIALYSIS/PERMA CATHETER REMOVAL;  Surgeon: Algernon Huxley, MD;  Location: [place] [facility];  Service: Cardiovascular;  Laterality: N/A;  . DISTAL FEMUR OSTEOTOMY Right    Dr. Earnestine Leys  . ESOPHAGOGASTRODUODENOSCOPY Left 01/11/2016   Procedure: ESOPHAGOGASTRODUODENOSCOPY (EGD);  Surgeon: Hulen Luster, MD;  Location: [facility]COPY;  Service: Endoscopy;  Laterality: Left;  . Finger fracture Right    5th finger  . FRACTURE SURGERY Right 01/25/2014   closed reduction & percutaneous pinning of 5th digit  . HAND SURGERY Bilateral    carpal tunnel  . JOINT REPLACEMENT Bilateral    knees  . KNEE SURGERY Bilateral 2003,2007   total Joint  . LAPAROSCOPY FOR ECTOPIC PREGNANCY    . PERIPHERAL VASCULAR CATHETERIZATION N/A 01/12/2016   Procedure: Visceral Angiography;  Surgeon: Algernon Huxley, MD;  Location: [facility];  Service: Cardiovascular;  Laterality: N/A;  . PERIPHERAL VASCULAR CATHETERIZATION N/A 01/12/2016   Procedure: Visceral Artery Intervention;  Surgeon: Algernon Huxley, MD;  Location: [place];  Service: Cardiovascular;  Laterality: N/A;  . REVISION TOTAL KNEE ARTHROPLASTY Right 12/31/2002   Dr. Marry Guan  . TUBAL LIGATION    . VIDEO BRONCHOSCOPY N/A 06/30/2015   Procedure: VIDEO BRONCHOSCOPY WITHOUT FLUORO;  Surgeon: Vilinda Boehringer, MD;  Location: [facility];   Service: Cardiopulmonary;  Laterality: N/A;    Prior to Admission medications   Medication Sig Start Date End Date Taking? Authorizing Provider  albuterol (VENTOLIN HFA) 108 (90 Base) MCG/ACT inhaler Inhale 2 puffs into the lungs every 6 (six) hours as needed for wheezing or shortness of breath. 05/21/19   Laban Emperor, PA-C  amiodarone (PACERONE) 200 MG tablet Take 1 tablet (200 mg total) by mouth daily for 21 days. 06/12/20 07/03/20  Kayleen Memos, DO  ascorbic acid (VITAMIN C) 500 MG tablet Take 500 mg by mouth 2 (two) times daily.    [provider]  DEXILANT 60 MG capsule Take 60 mg by mouth daily.  04/25/19   [provider]  famotidine (PEPCID) 20 MG tablet Take 20 mg by mouth daily.  05/08/19   [provider]  FLUoxetine (PROZAC) 20 MG capsule Take 20 mg by mouth daily.    [provider]  Multiple Vitamin (MULTIVITAMIN WITH MINERALS) TABS tablet Take 1 tablet by mouth daily.  [provider]  potassium chloride SA (KLOR-CON) 10 MEQ tablet Take 1 tablet (10 mEq total) by mouth daily for 7 days. 06/12/20 06/19/20  Kayleen Memos, DO  WIXELA INHUB 250-50 MCG/DOSE AEPB Inhale 1 puff into the lungs 2 (two) times daily. 11/05/19   [provider]    Allergies Acetaminophen, Ibuprofen, Gabapentin, Morphine, Morphine and related, and Zanaflex  [tizanidine]  Family History  Problem Relation Age of Onset  . Breast cancer Sister     Social History Social History   Tobacco Use  . Smoking status: Former Smoker    Packs/day: 0.25    Years: 38.00    Pack years: 9.50    Types: Cigarettes  . Smokeless tobacco: Never Used  . Tobacco comment: 1 cig daily  Vaping Use  . Vaping Use: Never used  Substance Use Topics  . Alcohol use: Yes    Alcohol/week: 5.0 standard drinks    Types: 5 Glasses of wine per week    Comment: occ  . Drug use: Not Currently    Review of Systems  Review of Systems  Constitutional: Positive for chills.  Negative for fever.  HENT: Negative for sore throat.   Eyes: Negative for pain.  Respiratory: Negative for cough and stridor.   Cardiovascular: Negative for chest pain.  Gastrointestinal: Negative for vomiting.  Musculoskeletal: Positive for back pain ( chronic), joint pain ( R shoulder, chronic from remot injury) and myalgias ( L arm).  Skin: Negative for rash.  Neurological: Negative for seizures, loss of consciousness and headaches.  Psychiatric/Behavioral: Negative for suicidal ideas.  All other systems reviewed and are negative.     ____________________________________________   PHYSICAL EXAM:  VITAL SIGNS: ED Triage Vitals  Enc Vitals Group     BP 11/25/20 1918 106/64     Pulse Rate 11/25/20 1918 87     Resp 11/25/20 1918 20     Temp 11/25/20 1918 98.8 F (37.1 C)     Temp Source 11/25/20 1918 Oral     SpO2 11/25/20 1918 96 %     Weight 11/25/20 1919 204 lb (92.5 kg)     Height 11/25/20 1919 '5\' 6"'$  (1.676 m)     Head Circumference --      Peak Flow --      Pain Score 11/25/20 1918 9     Pain Loc --      Pain Edu? --      Excl. in [place]? --    Vitals:   11/25/20 1918  BP: 106/64  Pulse: 87  Resp: 20  Temp: 98.8 F (37.1 C)  SpO2: 96%   Physical Exam Vitals and nursing note reviewed.  Constitutional:      General: She is not in acute distress.    Appearance: She is well-developed.  HENT:     Head: Normocephalic and atraumatic.     Right Ear: External ear normal.     Left Ear: External ear normal.     Nose: Nose normal.  Eyes:     Conjunctiva/sclera: Conjunctivae normal.  Cardiovascular:     Rate and Rhythm: Normal rate and regular rhythm.     Heart sounds: No murmur heard.   Pulmonary:     Effort: Pulmonary effort is normal. No respiratory distress.     Breath sounds: Normal breath sounds.  Abdominal:     Palpations: Abdomen is soft.     Tenderness: There is no abdominal tenderness.  Musculoskeletal:     Cervical back: Neck  supple.  Skin:     General: Skin is warm and dry.     Capillary Refill: Capillary refill takes less than 2 seconds.  Neurological:     Mental Status: She is alert and oriented to person, place, and time.  Psychiatric:        Mood and Affect: Mood normal.     PICC line appears clean dry with dressing intact.  There is no surrounding erythema, induration, warmth, fluctuance or other surrounding skin changes.  2+ bilateral radial pulses. ____________________________________________   LABS (all labs ordered are listed, but only abnormal results are displayed)  Labs Reviewed  BASIC METABOLIC PANEL - Abnormal; Notable for the following components:      Result Value   Sodium 133 (*)    CO2 20 (*)    Glucose, Bld 107 (*)    BUN 29 (*)    Creatinine, Ser 1.34 (*)    Calcium 8.3 (*)    GFR, Estimated 46 (*)    All other components within normal limits  CBC WITH DIFFERENTIAL/PLATELET - Abnormal; Notable for the following components:   WBC 12.3 (*)    RBC 3.16 (*)    Hemoglobin 9.8 (*)    HCT 28.3 (*)    RDW 16.8 (*)    Neutro Abs 9.8 (*)    Monocytes Absolute 1.1 (*)    All other components within normal limits  CULTURE, BLOOD (SINGLE)  BASIC METABOLIC PANEL  CBC   ____________________________________________  EKG  ____________________________________________  RADIOLOGY  ED MD interpretation  Official radiology report(s): No results found.  ____________________________________________   PROCEDURES  Procedure(s) performed (including Critical Care):  Procedures   ____________________________________________   INITIAL IMPRESSION / ASSESSMENT AND PLAN / ED COURSE      Presents with above-stated reexam after she was referred to the ED because she felt she was having some chills over the last couple days and refusing her TPN.  She does not otherwise have chills and denies any other acute sick symptoms.  She is afebrile and hemodynamically stable on arrival.  Her dressing appears clean  dry and intact and there is no surrounding skin changes to suggest any cellulitis.  In addition patient denies any other acute constitutional symptoms i.e. fever nausea vomiting myalgias cough or any other clear acute sick symptoms.  She is neurovascular intact distally left upper extremity.  BMP shows no significant electrolyte or metabolic derangements.  CBC shows mild leukocytosis with WBC of 12.3 with hemoglobin at baseline and no other significant derangements.  Overall I have low suspicion for infection and is likely patient is having some side effects of infusing TPN which is kept refrigerated. Do not believe patient is septic or bacteremic and I think she is safe for discharge with plan for outpatient follow-up.          ____________________________________________   FINAL CLINICAL IMPRESSION(S) / ED DIAGNOSES  Final diagnoses:  Status post peripherally inserted central catheter (PICC) central line placement    Medications  oxyCODONE (Oxy IR/ROXICODONE) immediate release tablet 5 mg (5 mg Oral Given 11/25/20 2008)     ED Discharge Orders    None       Note:  This document was prepared using Dragon voice recognition software and may include unintentional dictation errors.   Lucrezia Starch, MD 11/25/20 2018    Lucrezia Starch, MD 11/25/20 2039

## 2020-11-25 NOTE — ED Notes (Signed)
Per Dr Creig Hines collect one set of blood cultures from patient PICC - after flushing lumens 3 times each unsuccessful at blood return. Dr Tamala Julian notified and now at bedside.

## 2020-11-25 NOTE — ED Triage Notes (Signed)
Pt to triage via wheelchair.  Pt reports she was sent to er for possible picc line infection.  picc line in left upper arm x 24month.  No fever.  Pt reports chills.   Pt alert  Speech clear.

## 2020-11-25 NOTE — ED Notes (Signed)
Pt agreeable with d/c plan as discussed by Dr Creig Hines - this nurse has verbally reinforced d/c instructions and provided pt with written copy - pt acknowledges verbal understanding and denies any additional questions, concerns, needs - ambulatory independently at discharge with steady gait - ride waiting at ED entranc

## 2020-11-25 NOTE — ED Notes (Signed)
Pt lying in bed awake, alert and oriented x4.  Reports LUE PICC placed for nutrition to be administered.  States has started to have chills at night after infusions.  No redness, edema, firmness, abnormal drainage or other signs of complications observed at site - pt also denies tenderness with palpation.

## 2020-11-26 ENCOUNTER — Inpatient Hospital Stay
Admission: EM | Admit: 2020-11-26 | Discharge: 2020-12-02 | DRG: 314 | Disposition: A | Discharge status: Home Health | Payer: Medicare Other | Attending: Obstetrics and Gynecology | Admitting: Obstetrics and Gynecology

## 2020-11-26 ENCOUNTER — Telehealth: Payer: Self-pay | Admitting: Emergency Medicine

## 2020-11-26 ENCOUNTER — Inpatient Hospital Stay: Payer: Medicare Other

## 2020-11-26 ENCOUNTER — Other Ambulatory Visit: Payer: Self-pay

## 2020-11-26 DIAGNOSIS — Z9049 Acquired absence of other specified parts of digestive tract: Secondary | ICD-10-CM

## 2020-11-26 DIAGNOSIS — I120 Hypertensive chronic kidney disease with stage 5 chronic kidney disease or end stage renal disease: Secondary | ICD-10-CM | POA: Diagnosis present

## 2020-11-26 DIAGNOSIS — N179 Acute kidney failure, unspecified: Secondary | ICD-10-CM | POA: Diagnosis present

## 2020-11-26 DIAGNOSIS — B9689 Other specified bacterial agents as the cause of diseases classified elsewhere: Secondary | ICD-10-CM | POA: Diagnosis not present

## 2020-11-26 DIAGNOSIS — Z96653 Presence of artificial knee joint, bilateral: Secondary | ICD-10-CM | POA: Diagnosis present

## 2020-11-26 DIAGNOSIS — Z21 Asymptomatic human immunodeficiency virus [HIV] infection status: Secondary | ICD-10-CM | POA: Diagnosis present

## 2020-11-26 DIAGNOSIS — F32A Depression, unspecified: Secondary | ICD-10-CM | POA: Diagnosis present

## 2020-11-26 DIAGNOSIS — T80211A Bloodstream infection due to central venous catheter, initial encounter: Principal | ICD-10-CM | POA: Diagnosis present

## 2020-11-26 DIAGNOSIS — E669 Obesity, unspecified: Secondary | ICD-10-CM | POA: Diagnosis present

## 2020-11-26 DIAGNOSIS — I330 Acute and subacute infective endocarditis: Secondary | ICD-10-CM | POA: Diagnosis present

## 2020-11-26 DIAGNOSIS — D638 Anemia in other chronic diseases classified elsewhere: Secondary | ICD-10-CM | POA: Diagnosis not present

## 2020-11-26 DIAGNOSIS — Z20822 Contact with and (suspected) exposure to covid-19: Secondary | ICD-10-CM | POA: Diagnosis present

## 2020-11-26 DIAGNOSIS — Z885 Allergy status to narcotic agent status: Secondary | ICD-10-CM

## 2020-11-26 DIAGNOSIS — J440 Chronic obstructive pulmonary disease with acute lower respiratory infection: Secondary | ICD-10-CM | POA: Diagnosis present

## 2020-11-26 DIAGNOSIS — Y848 Other medical procedures as the cause of abnormal reaction of the patient, or of later complication, without mention of misadventure at the time of the procedure: Secondary | ICD-10-CM | POA: Diagnosis present

## 2020-11-26 DIAGNOSIS — K219 Gastro-esophageal reflux disease without esophagitis: Secondary | ICD-10-CM | POA: Diagnosis present

## 2020-11-26 DIAGNOSIS — A6009 Herpesviral infection of other urogenital tract: Secondary | ICD-10-CM | POA: Diagnosis present

## 2020-11-26 DIAGNOSIS — Z2239 Carrier of other specified bacterial diseases: Secondary | ICD-10-CM | POA: Diagnosis not present

## 2020-11-26 DIAGNOSIS — Z933 Colostomy status: Secondary | ICD-10-CM

## 2020-11-26 DIAGNOSIS — Z886 Allergy status to analgesic agent status: Secondary | ICD-10-CM

## 2020-11-26 DIAGNOSIS — J180 Bronchopneumonia, unspecified organism: Secondary | ICD-10-CM | POA: Diagnosis present

## 2020-11-26 DIAGNOSIS — B952 Enterococcus as the cause of diseases classified elsewhere: Secondary | ICD-10-CM | POA: Diagnosis present

## 2020-11-26 DIAGNOSIS — F40240 Claustrophobia: Secondary | ICD-10-CM | POA: Diagnosis present

## 2020-11-26 DIAGNOSIS — I351 Nonrheumatic aortic (valve) insufficiency: Secondary | ICD-10-CM | POA: Diagnosis present

## 2020-11-26 DIAGNOSIS — R7881 Bacteremia: Principal | ICD-10-CM | POA: Diagnosis present

## 2020-11-26 DIAGNOSIS — Z888 Allergy status to other drugs, medicaments and biological substances status: Secondary | ICD-10-CM

## 2020-11-26 DIAGNOSIS — G40909 Epilepsy, unspecified, not intractable, without status epilepticus: Secondary | ICD-10-CM | POA: Diagnosis present

## 2020-11-26 DIAGNOSIS — Z1621 Resistance to vancomycin: Secondary | ICD-10-CM | POA: Diagnosis present

## 2020-11-26 DIAGNOSIS — Z8673 Personal history of transient ischemic attack (TIA), and cerebral infarction without residual deficits: Secondary | ICD-10-CM

## 2020-11-26 DIAGNOSIS — J209 Acute bronchitis, unspecified: Secondary | ICD-10-CM | POA: Diagnosis present

## 2020-11-26 DIAGNOSIS — J309 Allergic rhinitis, unspecified: Secondary | ICD-10-CM | POA: Diagnosis present

## 2020-11-26 DIAGNOSIS — N189 Chronic kidney disease, unspecified: Secondary | ICD-10-CM | POA: Diagnosis not present

## 2020-11-26 DIAGNOSIS — Z86711 Personal history of pulmonary embolism: Secondary | ICD-10-CM

## 2020-11-26 DIAGNOSIS — Z87891 Personal history of nicotine dependence: Secondary | ICD-10-CM

## 2020-11-26 DIAGNOSIS — K5900 Constipation, unspecified: Secondary | ICD-10-CM | POA: Diagnosis present

## 2020-11-26 DIAGNOSIS — B20 Human immunodeficiency virus [HIV] disease: Secondary | ICD-10-CM | POA: Diagnosis not present

## 2020-11-26 DIAGNOSIS — G8929 Other chronic pain: Secondary | ICD-10-CM | POA: Diagnosis present

## 2020-11-26 DIAGNOSIS — D631 Anemia in chronic kidney disease: Secondary | ICD-10-CM | POA: Diagnosis present

## 2020-11-26 DIAGNOSIS — N186 End stage renal disease: Secondary | ICD-10-CM | POA: Diagnosis present

## 2020-11-26 DIAGNOSIS — Z6832 Body mass index (BMI) 32.0-32.9, adult: Secondary | ICD-10-CM

## 2020-11-26 DIAGNOSIS — Z79899 Other long term (current) drug therapy: Secondary | ICD-10-CM

## 2020-11-26 LAB — COMPREHENSIVE METABOLIC PANEL
ALT: 26 U/L (ref 0–44)
AST: 33 U/L (ref 15–41)
Albumin: 2.6 g/dL — ABNORMAL LOW (ref 3.5–5.0)
Alkaline Phosphatase: 126 U/L (ref 38–126)
Anion gap: 4 — ABNORMAL LOW (ref 5–15)
BUN: 24 mg/dL — ABNORMAL HIGH (ref 6–20)
CO2: 22 mmol/L (ref 22–32)
Calcium: 8.8 mg/dL — ABNORMAL LOW (ref 8.9–10.3)
Chloride: 109 mmol/L (ref 98–111)
Creatinine, Ser: 1.33 mg/dL — ABNORMAL HIGH (ref 0.44–1.00)
GFR, Estimated: 46 mL/min — ABNORMAL LOW (ref >60)
Glucose, Bld: 95 mg/dL (ref 70–99)
Potassium: 4.2 mmol/L (ref 3.5–5.1)
Sodium: 135 mmol/L (ref 135–145)
Total Bilirubin: 0.6 mg/dL (ref 0.3–1.2)
Total Protein: 7.4 g/dL (ref 6.5–8.1)

## 2020-11-26 LAB — CK: Total CK: 21 U/L — ABNORMAL LOW (ref 38–234)

## 2020-11-26 LAB — CBC WITH DIFFERENTIAL/PLATELET
Abs Immature Granulocytes: 0.04 10*3/uL (ref 0.00–0.07)
Basophils Absolute: 0 10*3/uL (ref 0.0–0.1)
Basophils Relative: 0 %
Eosinophils Absolute: 0.4 10*3/uL (ref 0.0–0.5)
Eosinophils Relative: 5 %
HCT: 28.6 % — ABNORMAL LOW (ref 36.0–46.0)
Hemoglobin: 9.2 g/dL — ABNORMAL LOW (ref 12.0–15.0)
Immature Granulocytes: 1 %
Lymphocytes Relative: 22 %
Lymphs Abs: 1.8 10*3/uL (ref 0.7–4.0)
MCH: 30.4 pg (ref 26.0–34.0)
MCHC: 32.2 g/dL (ref 30.0–36.0)
MCV: 94.4 fL (ref 80.0–100.0)
Monocytes Absolute: 1.1 10*3/uL — ABNORMAL HIGH (ref 0.1–1.0)
Monocytes Relative: 14 %
Neutro Abs: 5 10*3/uL (ref 1.7–7.7)
Neutrophils Relative %: 58 %
Platelets: 322 10*3/uL (ref 150–400)
RBC: 3.03 MIL/uL — ABNORMAL LOW (ref 3.87–5.11)
RDW: 17 % — ABNORMAL HIGH (ref 11.5–15.5)
WBC: 8.4 10*3/uL (ref 4.0–10.5)
nRBC: 0 % (ref 0.0–0.2)

## 2020-11-26 LAB — BLOOD CULTURE ID PANEL (REFLEXED) - BCID2
A.calcoaceticus-baumannii: NOT DETECTED
Bacteroides fragilis: NOT DETECTED
Candida albicans: NOT DETECTED
Candida auris: NOT DETECTED
Candida glabrata: NOT DETECTED
Candida krusei: NOT DETECTED
Candida parapsilosis: NOT DETECTED
Candida tropicalis: NOT DETECTED
Cryptococcus neoformans/gattii: NOT DETECTED
Enterobacter cloacae complex: NOT DETECTED
Enterobacterales: NOT DETECTED
Enterococcus Faecium: NOT DETECTED
Enterococcus faecalis: DETECTED — AB
Escherichia coli: NOT DETECTED
Haemophilus influenzae: NOT DETECTED
Klebsiella aerogenes: NOT DETECTED
Klebsiella oxytoca: NOT DETECTED
Klebsiella pneumoniae: NOT DETECTED
Listeria monocytogenes: NOT DETECTED
Neisseria meningitidis: NOT DETECTED
Proteus species: NOT DETECTED
Pseudomonas aeruginosa: NOT DETECTED
Salmonella species: NOT DETECTED
Serratia marcescens: NOT DETECTED
Staphylococcus aureus (BCID): NOT DETECTED
Staphylococcus epidermidis: NOT DETECTED
Staphylococcus lugdunensis: NOT DETECTED
Staphylococcus species: NOT DETECTED
Stenotrophomonas maltophilia: NOT DETECTED
Streptococcus agalactiae: NOT DETECTED
Streptococcus pneumoniae: NOT DETECTED
Streptococcus pyogenes: NOT DETECTED
Streptococcus species: NOT DETECTED
Vancomycin resistance: DETECTED — AB

## 2020-11-26 LAB — PROCALCITONIN: Procalcitonin: 13.02 ng/mL

## 2020-11-26 MED ORDER — MOMETASONE FURO-FORMOTEROL FUM 200-5 MCG/ACT IN AERO
2.0000 | INHALATION_SPRAY | Freq: Two times a day (BID) | RESPIRATORY_TRACT | Status: DC
  Administered 2020-11-27 – 2020-12-01 (×8): 2 via RESPIRATORY_TRACT
  Filled 2020-11-26: qty 8.8

## 2020-11-26 MED ORDER — HYDROMORPHONE HCL 1 MG/ML IJ SOLN
0.5000 mg | INTRAMUSCULAR | Status: AC | PRN
  Administered 2020-11-26 – 2020-11-27 (×3): 1 mg via INTRAVENOUS
  Filled 2020-11-26 (×3): qty 1

## 2020-11-26 MED ORDER — ONDANSETRON HCL 4 MG/2ML IJ SOLN: 4.0000 mg | Freq: Four times a day (QID) | INTRAMUSCULAR | Status: DC | PRN

## 2020-11-26 MED ORDER — PANTOPRAZOLE SODIUM 40 MG PO TBEC
40.0000 mg | DELAYED_RELEASE_TABLET | Freq: Every day | ORAL | Status: DC
  Administered 2020-11-27 – 2020-12-02 (×6): 40 mg via ORAL
  Filled 2020-11-26 (×6): qty 1

## 2020-11-26 MED ORDER — AMOXICILLIN-POT CLAVULANATE 875-125 MG PO TABS
1.0000 | ORAL_TABLET | Freq: Two times a day (BID) | ORAL | Status: DC
  Administered 2020-11-27 (×2): 1 via ORAL
  Filled 2020-11-26 (×2): qty 1

## 2020-11-26 MED ORDER — FLUOXETINE HCL 20 MG PO CAPS
20.0000 mg | ORAL_CAPSULE | Freq: Every day | ORAL | Status: DC
  Administered 2020-11-27: 09:00:00 20 mg via ORAL
  Filled 2020-11-26: qty 1

## 2020-11-26 MED ORDER — ENOXAPARIN SODIUM 40 MG/0.4ML ~~LOC~~ SOLN
40.0000 mg | SUBCUTANEOUS | Status: DC
  Administered 2020-11-26 – 2020-12-01 (×6): 40 mg via SUBCUTANEOUS
  Filled 2020-11-26 (×6): qty 0.4

## 2020-11-26 MED ORDER — FAMOTIDINE 20 MG PO TABS
20.0000 mg | ORAL_TABLET | Freq: Every day | ORAL | Status: DC
  Administered 2020-11-27 – 2020-12-02 (×6): 20 mg via ORAL
  Filled 2020-11-26 (×6): qty 1

## 2020-11-26 MED ORDER — DEXTROSE IN LACTATED RINGERS 5 % IV SOLN: INTRAVENOUS | Status: DC

## 2020-11-26 MED ORDER — ACETAMINOPHEN 325 MG PO TABS
650.0000 mg | ORAL_TABLET | Freq: Four times a day (QID) | ORAL | Status: DC | PRN
  Administered 2020-11-29 – 2020-11-30 (×2): 650 mg via ORAL
  Filled 2020-11-26 (×3): qty 2

## 2020-11-26 MED ORDER — SODIUM CHLORIDE 0.9 % IV SOLN
750.0000 mg | Freq: Every day | INTRAVENOUS | Status: DC
  Administered 2020-11-26 – 2020-11-27 (×2): 750 mg via INTRAVENOUS
  Filled 2020-11-26 (×3): qty 15

## 2020-11-26 MED ORDER — OXYCODONE HCL 5 MG PO TABS
5.0000 mg | ORAL_TABLET | ORAL | Status: AC
Start: 2020-11-26 — End: 2020-11-26
  Administered 2020-11-26: 5 mg via ORAL
  Filled 2020-11-26: qty 1

## 2020-11-26 MED ORDER — LACTATED RINGERS IV SOLN: INTRAVENOUS | Status: DC

## 2020-11-26 NOTE — Progress Notes (Signed)
Brief Pharmacy Note  Consult received for TPN and electrolyte management. Patient's PICC line has been removed for bacteremia. Currently without access for TPN. Will not be able to start TPN until tomorrow at the earliest and that is if new central access can be established. Will recommend dextrose containing fluids as indicated. Continue to follow along.  Dorena Bodo, PharmD

## 2020-11-26 NOTE — ED Provider Notes (Signed)
[facility] Emergency Department Provider Note  ____________________________________________   Event Date/Time   First MD Initiated Contact with Patient 11/26/20 1544     (approximate)  I have reviewed the triage vital signs and the nursing notes.   HISTORY  Chief Complaint abnormal labs   HPI Melanie Bowen is a 60 y.o. female with a past medical history of COPD, GERD, depression, HIV, HTN, diverticulitis status post partial colectomy with colostomy and left upper extremity PICC line for TPN who presents for assessment of concern for bacteremia from possible PICC line infection.  Patient was seen by this examiner yesterday after he was told to come to the ED because she had been having some chills however she was infusing her TPN over the last couple of days.  She does not have any chills since then she has not been using her TPN which is typically stored refrigerator otherwise denied any acute symptoms yesterday or today any presentation including fevers, headache, earache, sore throat, vomiting, change in ostomy output, urinary symptoms, abdominal pain, back pain, rash or any skin changes around the PICC line site itself.  Denies any other acute concerns at this time.         Past Medical History:  Diagnosis Date  . Acute GI hemorrhage 12/2015   required transfusion  . Allergic rhinitis   . Arthritis    DDD  . Asthma   . Blepharitis   . Bronchitis   . Cigarette smoker   . Claustrophobia   . COPD (chronic obstructive pulmonary disease) ([place])   . Cough   . DDD (degenerative disc disease), lumbar 04/10/2014  . Depression   . Diverticulosis   . Ectopic pregnancy   . Eye irritation   . Genital herpes   . GERD (gastroesophageal reflux disease)   . Headache   . HIV (human immunodeficiency virus infection) ([place])   . Hypertension   . Paresthesia of hand, bilateral   . Shortness of breath dyspnea    at times due to asthma    Patient Active  Problem List   Diagnosis Date Noted  . Bacteremia 11/26/2020  . Pressure injury of sacral region, stage 3 ([place]) 10/30/2020  . Chronic liver failure without hepatic coma ([place]) 10/30/2020  . Weakness 06/11/2020  . Fever 06/09/2020  . Unspecified atrial fibrillation ([place]) 06/09/2020  . Hypokalemia 06/08/2020  . Pressure injury of skin 05/08/2020  . Goals of care, counseling/discussion   . Dyspnea   . Acute renal failure (ARF) ([place]) 04/16/2020  . Severe sepsis with septic shock ([place]) 04/16/2020  . Hypotension 04/16/2020  . Diverticulosis 04/16/2020  . Acute respiratory distress   . DNR (do not resuscitate) discussion   . Palliative care by specialist   . Abscess of sigmoid colon due to diverticulitis 04/14/2020  . Multifocal pneumonia 11/06/2019  . AKI (acute kidney injury) ([place]) 11/06/2019  . Leukocytosis 11/06/2019  . Diverticulitis of large intestine with abscess without bleeding 01/17/2019  . Morbid (severe) obesity due to excess calories ([place]) 11/26/2018  . Violation of controlled substance agreement 12/05/2017  . Positive urine drug screen (+cocaine) 12/05/2017  . Cervicalgia 12/05/2017  . Drug-seeking behavior 12/05/2017  . Community acquired pneumonia 11/28/2016  . Pneumonia 11/28/2016  . Allergic rhinitis 05/27/2016  . Tobacco abuse counseling 02/11/2016  . Symptomatic anemia   . Lower GI bleed   . Anemia 01/16/2016  . GI bleed 01/10/2016  . GERD (gastroesophageal reflux disease) 01/10/2016  . HTN (hypertension) 01/10/2016  .  Depression 01/10/2016  . HIV (human immunodeficiency virus infection) ([place]) 01/10/2016  . Lumbar pseudoarthrosis 10/31/2015  . Chronic cough   . Cough 06/19/2015  . DDD (degenerative disc disease), lumbar 12/31/2014  . Degenerative disc disease, lumbar 12/31/2014  . Asthma, chronic 11/12/2014    Past Surgical History:  Procedure Laterality Date  . BACK SURGERY  12/2014   lumbar lam. Dr. Deri Fuelling  . BACK SURGERY  12/25/2012   Removal  lumbosubarachnoid shunt system, L5-S1 TF ESI, Dr. Virl Axe Minchew  . back surgery may 2016     lumbar   . BUNIONECTOMY Bilateral    feet  . COLONOSCOPY  12/06/2014   rescheduled from 11/01/2014 due to poor prep  . DIAGNOSTIC LAPAROSCOPY    . DIALYSIS/PERMA CATHETER INSERTION N/A 04/25/2020   Procedure: DIALYSIS/PERMA CATHETER INSERTION;  Surgeon: Algernon Huxley, MD;  Location: [facility];  Service: Cardiovascular;  Laterality: N/A;  . DIALYSIS/PERMA CATHETER REMOVAL N/A 05/23/2020   Procedure: DIALYSIS/PERMA CATHETER REMOVAL;  Surgeon: Algernon Huxley, MD;  Location: [place] [facility];  Service: Cardiovascular;  Laterality: N/A;  . DISTAL FEMUR OSTEOTOMY Right    Dr. Earnestine Leys  . ESOPHAGOGASTRODUODENOSCOPY Left 01/11/2016   Procedure: ESOPHAGOGASTRODUODENOSCOPY (EGD);  Surgeon: Hulen Luster, MD;  Location: [facility] ENDOSCOPY;  Service: Endoscopy;  Laterality: Left;  . Finger fracture Right    5th finger  . FRACTURE SURGERY Right 01/25/2014   closed reduction & percutaneous pinning of 5th digit  . HAND SURGERY Bilateral    carpal tunnel  . JOINT REPLACEMENT Bilateral    knees  . KNEE SURGERY Bilateral 2003,2007   total Joint  . LAPAROSCOPY FOR ECTOPIC PREGNANCY    . PERIPHERAL VASCULAR CATHETERIZATION N/A 01/12/2016   Procedure: Visceral Angiography;  Surgeon: Algernon Huxley, MD;  Location: [place] [facility];  Service: Cardiovascular;  Laterality: N/A;  . PERIPHERAL VASCULAR CATHETERIZATION N/A 01/12/2016   Procedure: Visceral Artery Intervention;  Surgeon: Algernon Huxley, MD;  Location: [place] [facility];  Service: Cardiovascular;  Laterality: N/A;  . REVISION TOTAL KNEE ARTHROPLASTY Right 12/31/2002   Dr. Marry Guan  . TUBAL LIGATION    . VIDEO BRONCHOSCOPY N/A 06/30/2015   Procedure: VIDEO BRONCHOSCOPY WITHOUT FLUORO;  Surgeon: Vilinda Boehringer, MD;  Location: [facility] ORS;  Service: Cardiopulmonary;  Laterality: N/A;    Prior to Admission medications   Medication Sig Start Date End  Date Taking? Authorizing Provider  albuterol (VENTOLIN HFA) 108 (90 Base) MCG/ACT inhaler Inhale 2 puffs into the lungs every 6 (six) hours as needed for wheezing or shortness of breath. 05/21/19   Laban Emperor, PA-C  amiodarone (PACERONE) 200 MG tablet Take 1 tablet (200 mg total) by mouth daily for 21 days. 06/12/20 07/03/20  Kayleen Memos, DO  ascorbic acid (VITAMIN C) 500 MG tablet Take 500 mg by mouth 2 (two) times daily.    [provider]  DEXILANT 60 MG capsule Take 60 mg by mouth daily.  04/25/19   [provider]  famotidine (PEPCID) 20 MG tablet Take 20 mg by mouth daily.  05/08/19   [provider]  FLUoxetine (PROZAC) 20 MG capsule Take 20 mg by mouth daily.    [provider]  Multiple Vitamin (MULTIVITAMIN WITH MINERALS) TABS tablet Take 1 tablet by mouth daily.    [provider]  potassium chloride SA (KLOR-CON) 10 MEQ tablet Take 1 tablet (10 mEq total) by mouth daily for 7 days. 06/12/20 06/19/20  Kayleen Memos, DO  WIXELA INHUB 250-50 MCG/DOSE AEPB Inhale 1 puff into the lungs 2 (two) times daily. 11/05/19   [provider]    Allergies Acetaminophen, Ibuprofen, Gabapentin, Morphine, Morphine and related, and Zanaflex  [tizanidine]  Family History  Problem Relation Age of Onset  . Breast cancer Sister     Social History Social History   Tobacco Use  . Smoking status: Former Smoker    Packs/day: 0.25    Years: 38.00    Pack years: 9.50    Types: Cigarettes  . Smokeless tobacco: Never Used  . Tobacco comment: 1 cig daily  Vaping Use  . Vaping Use: Never used  Substance Use Topics  . Alcohol use: Yes    Alcohol/week: 5.0 standard drinks    Types: 5 Glasses of wine per week    Comment: occ  . Drug use: Not Currently    Review of Systems  Review of Systems  Constitutional: Positive for chills. Negative for fever.  HENT: Negative for sore throat.   Eyes: Negative for pain.  Respiratory: Negative for cough  and stridor.   Cardiovascular: Negative for chest pain.  Gastrointestinal: Negative for vomiting.  Genitourinary: Negative for dysuria.  Musculoskeletal: Negative for myalgias.  Skin: Negative for rash.  Neurological: Negative for seizures, loss of consciousness and headaches.  Psychiatric/Behavioral: Negative for suicidal ideas.  All other systems reviewed and are negative.     ____________________________________________   PHYSICAL EXAM:  VITAL SIGNS: ED Triage Vitals  Enc Vitals Group     BP 11/26/20 1458 119/73     Pulse Rate 11/26/20 1458 85     Resp 11/26/20 1458 18     Temp 11/26/20 1458 (!) 97.5 F (36.4 C)     Temp Source 11/26/20 1458 Oral     SpO2 11/26/20 1458 98 %     Weight 11/26/20 1457 202 lb 13.2 oz (92 kg)     Height 11/26/20 1457 '5\' 6"'$  (1.676 m)     Head Circumference --      Peak Flow --      Pain Score 11/26/20 1457 0     Pain Loc --      Pain Edu? --      Excl. in [place]? --    Vitals:   11/26/20 1600 11/26/20 1630  BP: 122/66 (!) 128/47  Pulse:    Resp:    Temp:    SpO2:     Physical Exam Vitals and nursing note reviewed.  Constitutional:      General: She is not in acute distress.    Appearance: She is well-developed.  HENT:     Head: Normocephalic and atraumatic.     Right Ear: External ear normal.     Left Ear: External ear normal.     Nose: Nose normal.  Eyes:     Conjunctiva/sclera: Conjunctivae normal.  Cardiovascular:     Rate and Rhythm: Normal rate and regular rhythm.     Heart sounds: No murmur heard.   Pulmonary:     Effort: Pulmonary effort is normal. No respiratory distress.     Breath sounds: Normal breath sounds.  Abdominal:     Palpations: Abdomen is soft.     Tenderness: There is no abdominal tenderness.  Musculoskeletal:     Cervical back: Neck supple.  Skin:    General: Skin is warm and dry.     Capillary Refill: Capillary refill takes less than 2 seconds.  Neurological:     Mental Status: She is  alert and  oriented to person, place, and time.  Psychiatric:        Mood and Affect: Mood normal.     2+ bilateral radial pulses.  Sensation is intact to light touch throughout the bilateral upper extremities.  PICC line dressing appears clean dry and intact without any surrounding skin changes. ____________________________________________   LABS (all labs ordered are listed, but only abnormal results are displayed)  Labs Reviewed  CBC WITH DIFFERENTIAL/PLATELET - Abnormal; Notable for the following components:      Result Value   RBC 3.03 (*)    Hemoglobin 9.2 (*)    HCT 28.6 (*)    RDW 17.0 (*)    Monocytes Absolute 1.1 (*)    All other components within normal limits  COMPREHENSIVE METABOLIC PANEL - Abnormal; Notable for the following components:   BUN 24 (*)    Creatinine, Ser 1.33 (*)    Calcium 8.8 (*)    Albumin 2.6 (*)    GFR, Estimated 46 (*)    Anion gap 4 (*)    All other components within normal limits  CK - Abnormal; Notable for the following components:   Total CK 21 (*)    All other components within normal limits  SARS CORONAVIRUS 2 (TAT 6-24 HRS)  CULTURE, BLOOD (ROUTINE X 2)  CULTURE, BLOOD (ROUTINE X 2)  PROCALCITONIN  PROCALCITONIN   ____________________________________________  EKG  ____________________________________________  RADIOLOGY  ED MD interpretation:   Official radiology report(s): No results found.  ____________________________________________   PROCEDURES  Procedure(s) performed (including Critical Care):  .1-3 Lead EKG Interpretation Performed by: Lucrezia Starch, MD Authorized by: Lucrezia Starch, MD     Interpretation: normal     ECG rate assessment: normal     Rhythm: sinus rhythm     Ectopy: none     Conduction: normal       ____________________________________________   INITIAL IMPRESSION / ASSESSMENT AND PLAN / ED COURSE      Patient presents after being called back to the ED after she had a peripheral blood  culture obtained yesterday that did return positive.  She initially presented yesterday with consideration of PICC line infection and she is reporting some chills whenever increasing her TPN to her physician.  On arrival today she is afebrile hemodynamically stable.  She continues to deny any other symptoms and states she only feels the chills when she is infusing TPN.  PICC line itself is in place without any surrounding skin changes to suggest cellulitis.  Reviewed culture sent from triage that was obtained in the left hand yesterday which did return positive for Enterococcus faecalis with vancomycin resistance.  Per pharmacy patient should be started immediately on daptomycin which was ordered prior to me seeing the patient.  Discussed patient with on-call infectious disease physician Dr. Tsosie Billing recommended removing patient's PICC line.  I did remove patient's PICC line without any issues.  Patient was started on daptomycin in the ED per infectious disease recommendations.  CBC obtained shows no leukocytosis and hemoglobin at baseline.  CMP shows no significant derangements.  CKs unremarkable.  Procalcitonin is 13.02.  Patient will be admitted to medicine service for further evaluation and management.  Do not believe patient is currently septic.      ____________________________________________   FINAL CLINICAL IMPRESSION(S) / ED DIAGNOSES  Final diagnoses:  Bacteremia    Medications  DAPTOmycin (CUBICIN) 750 mg in sodium chloride 0.9 % IVPB (has no administration in time range)  oxyCODONE (Oxy IR/ROXICODONE) immediate release tablet 5 mg (has no administration in time range)  pantoprazole (PROTONIX) EC tablet 40 mg (has no administration in time range)  famotidine (PEPCID) tablet 20 mg (has no administration in time range)  FLUoxetine (PROZAC) capsule 20 mg (has no administration in time range)  mometasone-formoterol (DULERA) 200-5 MCG/ACT inhaler 2 puff (has no  administration in time range)     ED Discharge Orders    None       Note:  This document was prepared using Dragon voice recognition software and may include unintentional dictation errors.   Lucrezia Starch, MD 11/26/20 [phone]

## 2020-11-26 NOTE — H&P (Addendum)
History and Physical  Melanie Bowen [id number] DOB: 06/17/1961 DOA: 11/26/2020  Referring physician: Dr. Tamala Julian, [place] PCP: Alene Mires Elyse Jarvis, MD  Outpatient Specialists: Infectious disease, general surgery. Patient coming from: Home.  Chief Complaint: Chills and bacteremia, callback from ED.  HPI: Melanie Bowen is a 60 y.o. female with medical history significant for COPD not on oxygen supplementation at baseline, GERD, essential hypertension, HIV on HAART, CVA, PE now off anticoagulation, depression, seizure disorder, perforated diverticulitis and abscess status post Hartman's in November 2021, enterocutaneous fistula and, malnutrition with TPN dependence who presented to [facility] ED after receiving a call back from the emergency department due to a positive blood culture.  Patient presented initially the day prior to this admission due to complaints of chills for the past 2 days.  Patient had blood cultures drawn in the ED that resulted today 1 out of 2 peripheral blood cultures positive for vancomycin-resistant Enterococcus faecalis.  Patient has a PICC line which she uses for her TPN.  EDP contacted infectious disease who recommended starting IV daptomycin.  Blood cultures were repeated in the ED today.  EDP requested admission for VRE bacteremia.  ED Course:  Temperature 98.8, BP 122/66, pulse 74, O2 saturation 96% on room air.  Lab studies remarkable for creatinine 1.3 with baseline of 0.9, procalcitonin 13, WBC 8.4K from 12.3K yesterday, hemoglobin 9.8K, platelet count 322K.  Review of Systems: Review of systems as noted in the HPI. All other systems reviewed and are negative.   Past Medical History:  Diagnosis Date  . Acute GI hemorrhage 12/2015   required transfusion  . Allergic rhinitis   . Arthritis    DDD  . Asthma   . Blepharitis   . Bronchitis   . Cigarette smoker   . Claustrophobia   . COPD (chronic obstructive pulmonary disease) ([place])   . Cough   . DDD  (degenerative disc disease), lumbar 04/10/2014  . Depression   . Diverticulosis   . Ectopic pregnancy   . Eye irritation   . Genital herpes   . GERD (gastroesophageal reflux disease)   . Headache   . HIV (human immunodeficiency virus infection) ([place])   . Hypertension   . Paresthesia of hand, bilateral   . Shortness of breath dyspnea    at times due to asthma   Past Surgical History:  Procedure Laterality Date  . BACK SURGERY  12/2014   lumbar lam. Dr. Deri Fuelling  . BACK SURGERY  12/25/2012   Removal lumbosubarachnoid shunt system, L5-S1 TF ESI, Dr. Virl Axe Minchew  . back surgery may 2016     lumbar   . BUNIONECTOMY Bilateral    feet  . COLONOSCOPY  12/06/2014   rescheduled from 11/01/2014 due to poor prep  . DIAGNOSTIC LAPAROSCOPY    . DIALYSIS/PERMA CATHETER INSERTION N/A 04/25/2020   Procedure: DIALYSIS/PERMA CATHETER INSERTION;  Surgeon: Algernon Huxley, MD;  Location: [place] [facility];  Service: Cardiovascular;  Laterality: N/A;  . DIALYSIS/PERMA CATHETER REMOVAL N/A 05/23/2020   Procedure: DIALYSIS/PERMA CATHETER REMOVAL;  Surgeon: Algernon Huxley, MD;  Location: [place] [facility];  Service: Cardiovascular;  Laterality: N/A;  . DISTAL FEMUR OSTEOTOMY Right    Dr. Earnestine Leys  . ESOPHAGOGASTRODUODENOSCOPY Left 01/11/2016   Procedure: ESOPHAGOGASTRODUODENOSCOPY (EGD);  Surgeon: Hulen Luster, MD;  Location: [facility] ENDOSCOPY;  Service: Endoscopy;  Laterality: Left;  . Finger fracture Right    5th finger  . FRACTURE SURGERY Right 01/25/2014   closed reduction & percutaneous  pinning of 5th digit  . HAND SURGERY Bilateral    carpal tunnel  . JOINT REPLACEMENT Bilateral    knees  . KNEE SURGERY Bilateral 2003,2007   total Joint  . LAPAROSCOPY FOR ECTOPIC PREGNANCY    . PERIPHERAL VASCULAR CATHETERIZATION N/A 01/12/2016   Procedure: Visceral Angiography;  Surgeon: Algernon Huxley, MD;  Location: [place] [facility];  Service: Cardiovascular;  Laterality: N/A;  . PERIPHERAL  VASCULAR CATHETERIZATION N/A 01/12/2016   Procedure: Visceral Artery Intervention;  Surgeon: Algernon Huxley, MD;  Location: [place] [facility];  Service: Cardiovascular;  Laterality: N/A;  . REVISION TOTAL KNEE ARTHROPLASTY Right 12/31/2002   Dr. Marry Guan  . TUBAL LIGATION    . VIDEO BRONCHOSCOPY N/A 06/30/2015   Procedure: VIDEO BRONCHOSCOPY WITHOUT FLUORO;  Surgeon: Vilinda Boehringer, MD;  Location: [facility] ORS;  Service: Cardiopulmonary;  Laterality: N/A;    Social History:  reports that she has quit smoking. Her smoking use included cigarettes. She has a 9.50 pack-year smoking history. She has never used smokeless tobacco. She reports current alcohol use of about 5.0 standard drinks of alcohol per week. She reports previous drug use.   Allergies  Allergen Reactions  . Acetaminophen Nausea And Vomiting  . Ibuprofen Other (See Comments)    Reports causes bleeding  . Gabapentin Rash  . Morphine Itching and Rash  . Morphine And Related Itching  . Zanaflex  [Tizanidine] Rash    Family History  Problem Relation Age of Onset  . Breast cancer Sister       Prior to Admission medications   Medication Sig Start Date End Date Taking? Authorizing Provider  albuterol (VENTOLIN HFA) 108 (90 Base) MCG/ACT inhaler Inhale 2 puffs into the lungs every 6 (six) hours as needed for wheezing or shortness of breath. 05/21/19   Laban Emperor, PA-C  amiodarone (PACERONE) 200 MG tablet Take 1 tablet (200 mg total) by mouth daily for 21 days. 06/12/20 07/03/20  Kayleen Memos, DO  ascorbic acid (VITAMIN C) 500 MG tablet Take 500 mg by mouth 2 (two) times daily.    [provider]  DEXILANT 60 MG capsule Take 60 mg by mouth daily.  04/25/19   [provider]  famotidine (PEPCID) 20 MG tablet Take 20 mg by mouth daily.  05/08/19   [provider]  FLUoxetine (PROZAC) 20 MG capsule Take 20 mg by mouth daily.    [provider]  Multiple Vitamin (MULTIVITAMIN WITH MINERALS) TABS tablet  Take 1 tablet by mouth daily.    [provider]  potassium chloride SA (KLOR-CON) 10 MEQ tablet Take 1 tablet (10 mEq total) by mouth daily for 7 days. 06/12/20 06/19/20  Kayleen Memos, DO  WIXELA INHUB 250-50 MCG/DOSE AEPB Inhale 1 puff into the lungs 2 (two) times daily. 11/05/19   [provider]    Physical Exam: BP (!) 128/47   Pulse 74   Temp (!) 97.5 F (36.4 C) (Oral)   Resp 18   Ht '5\' 6"'$  (1.676 m)   Wt 92 kg   SpO2 100%   BMI 32.74 kg/m   . General: 60 y.o. year-old female well developed well nourished in no acute distress.  Alert and oriented x3. . Cardiovascular: Regular rate and rhythm with no rubs or gallops.  No thyromegaly or JVD noted.  No lower extremity edema. 2/4 pulses in all 4 extremities. Marland Kitchen Respiratory: Clear to auscultation with no wheezes or rales. Good inspiratory effort.  Left upper quadrant colostomy  with stool present. . Abdomen: Soft nontender nondistended with normal bowel sounds x4 quadrants. . Muskuloskeletal: No cyanosis, clubbing or edema noted bilaterally . Neuro: CN II-XII intact, strength, sensation, reflexes . Skin: No ulcerative lesions noted or rashes . Psychiatry: Judgement and insight appear normal. Mood is appropriate for condition and setting          Labs on Admission:  Basic Metabolic Panel: Recent Labs  Lab 11/25/20 1922 11/26/20 1614  NA 133* 135  K 4.0 4.2  CL 107 109  CO2 20* 22  GLUCOSE 107* 95  BUN 29* 24*  CREATININE 1.34* 1.33*  CALCIUM 8.3* 8.8*   Liver Function Tests: Recent Labs  Lab 11/26/20 1614  AST 33  ALT 26  ALKPHOS 126  BILITOT 0.6  PROT 7.4  ALBUMIN 2.6*   No results for input(s): LIPASE, AMYLASE in the last 168 hours. No results for input(s): AMMONIA in the last 168 hours. CBC: Recent Labs  Lab 11/25/20 1922 11/26/20 1614  WBC 12.3* 8.4  NEUTROABS 9.8* 5.0  HGB 9.8* 9.2*  HCT 28.3* 28.6*  MCV 89.6 94.4  PLT 332 322   Cardiac Enzymes: No results for input(s):  CKTOTAL, CKMB, CKMBINDEX, TROPONINI in the last 168 hours.  BNP (last 3 results) No results for input(s): BNP in the last 8760 hours.  ProBNP (last 3 results) No results for input(s): PROBNP in the last 8760 hours.  CBG: No results for input(s): GLUCAP in the last 168 hours.  Radiological Exams on Admission: No results found.  EKG: I independently viewed the EKG done and my findings are as followed: Not available at the time of this visit.  Assessment/Plan Present on Admission: . Bacteremia  Active Problems:   Bacteremia  Vancomycin resistant Enterococcus faecalis bacteremia, POA Blood cultures drawn peripherally on 11/25/2020 revealed vancomycin resistant Enterococcus faecalis 1 out of 2 bottles, patient called at home to return to the ED. Repeated blood cultures x2 on 11/26/2020, ordered by EDP, currently in process. Unclear source of bacteremia History of EC fistula ID consulted by EDP, recommended IV daptomycin. Procalcitonin elevated 13, repeat procalcitonin level in the morning. Replace PICC line once repeat blood cultures are negative.  Recent history of EC fistula post Hartmann's procedure Patient has had a history of diverticulitis with perforation and abscess Colostomy bag in place, history of EC fistula Currently on TPN She reports she is able to eat some food General surgery consult for further recommendations Pharmacy consult for TPN management and electrolyte replacement Obtain CT abdomen and pelvis without contrast to look for abscesses or recurrent fistula. N.p.o. until cleared by general surgery. LR D5 at 75 cc/h while NPO.  AKI Baseline creatinine appears to be 0.9 with GFR greater than 60 Presented with creatinine of 1.3 with GFR 46. Start gentle IV fluid hydration Avoid nephrotoxic agents and dehydration Monitor urine output Repeat renal panel monitor with  HIV on HAART Management per infectious disease Resume home HAART  Chronic  anxiety/depression/GERD/COPD not on oxygen supplementation Resume home regimen  History of PE Off oral anticoagulation Monitor Currently O2 saturation 100% on room air.   DVT prophylaxis: Subcu Lovenox daily.  Code Status: Full code as stated by the patient herself.  Family Communication: None at bedside.  Disposition Plan: Admit to MedSurg with remote telemetry.  Consults called: Infectious disease, general surgery.  Sent a secure chat to request consult to Dr. Lutricia Feil general surgery and Dr. Steva Ready infectious disease.  Admission status: Inpatient status.  Patient will require at least  2 midnights for further evaluation and treatment of present condition.   Status is: Inpatient    Dispo:  Patient From: Home  Planned Disposition: Home  Medically stable for discharge: No, ongoing management of bacteremia.         Kayleen Memos MD [facility] Pager [phone]  If 7PM-7AM, please contact night-coverage [email or web address] Password [facility]  11/26/2020, 5:19 PM

## 2020-11-26 NOTE — ED Triage Notes (Signed)
Pt comes pov with positive blood cultures. Pt called today and told to come back. Pt in NAD-only has cough that has been present since December.

## 2020-11-26 NOTE — Progress Notes (Addendum)
Pharmacy Antibiotic Note  Melanie Bowen is a 60 y.o. female admitted on 11/26/2020. Patient presented to ED 3/29 with concern for PICC line infection as patient was having chills after TPN infusions. Symptoms attributed to possible side effect of TPN and patient discharged with plan for outpatient follow up. One set of blood cultures obtained which grew E.faecalis with vancomycin resistance detected. Pharmacy has been consulted for daptomycin dosing. ID to see the patient.  PICC line has been removed by EDP.  Plan: Daptomycin 750 mg IV q24h  Height: '5\' 6"'$  (167.6 cm) Weight: 92 kg (202 lb 13.2 oz) IBW/kg (Calculated) : 59.3  Temp (24hrs), Avg:98.2 F (36.8 C), Min:97.5 F (36.4 C), Max:98.8 F (37.1 C)  Recent Labs  Lab 11/25/20 1922 11/26/20 1614  WBC 12.3* 8.4  CREATININE 1.34* 1.33*    Estimated Creatinine Clearance: 52.7 mL/min (A) (by C-G formula based on SCr of 1.33 mg/dL (H)).    Allergies  Allergen Reactions  . Acetaminophen Nausea And Vomiting  . Ibuprofen Other (See Comments)    Reports causes bleeding  . Gabapentin Rash  . Morphine Itching and Rash  . Morphine And Related Itching  . Zanaflex  [Tizanidine] Rash    Antimicrobials this admission: Daptomycin 3/30 >>  Microbiology results: 3/29 BCx: VRE (1 bottle) - susc pending 3/30 BCx: pending   Thank you for allowing pharmacy to be a part of this patient's care.  Tawnya Crook, PharmD 11/26/2020 4:59 PM

## 2020-11-26 NOTE — ED Notes (Signed)
Per Creig Hines, wait until pt gets in room for labs/additional work up.

## 2020-11-26 NOTE — Telephone Encounter (Signed)
Received blood culture result and reviewed by Dr. Charna Archer.  Would like patient to return.  I called the patient.  She says no fever and she feels okay.  Her doctor is in [place] and she cannot go there today.  She agrees to return here for repeat blood work today.

## 2020-11-26 NOTE — Progress Notes (Addendum)
Added Augmentin for possible bronchitis and bronchiopneumomia seen on CT scan.  Patient is on Clindamycin for VRE bacteremia, which would not be effective in treating pneumonia due to inactivation of antimicrobial activity by pulmonary surfactant.  Repeating procalcitonin level in the AM.  Contact precautions in place for VRE infection.    2D echo pending to rule out infective endocarditis.

## 2020-11-26 NOTE — Consult Note (Signed)
NAME: Melanie Bowen  DOB: 11/01/1960  MRN: [id number]  Date/Time: 11/26/2020 8:52 PM  REQUESTING PROVIDER: Dr. Nevada Crane Subjective:  REASON FOR CONSULT: Bacteremia  ? Melanie Bowen is a 60 y.o. female with a history of complicated perforated diverticulitis necessitating prolonged hospitalization in August September 2021, end-stage renal disease, HIV and presents to the ED with chills and likely infected PICC line. Patient has a complicated history.  In August/September 2021 she was hospitalized at [facility] with perforated diverticulitis, septic shock, multiorgan system failure and was managed conservatively with left lower quadrant drain and IV antibiotics..  She had a prolonged hospitalization and had IV antibiotics.  And was discharged home.  She then was admitted to [facility] 06/23/2020 until 08/20/2020. During that hospitalization she underwent expiratory laparotomy, sigmoidectomy and creation of end colostomy on 06/30/2020.Marland Kitchen  She also had cystourethroscopy with insertion of indwelling ureteral stents for this process. Low output enterocutaneous fistula was noted and patient was began on TPN on 07/21/2020.  She was kept n.p.o.  A triple contrast CT scan did not show evidence of rectal stump blowout or leak.  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  The EC fistula had very little output until 08/18/2020 with output was started to increase.  The wound was pouched for measuring in volume. During that hospitalization she also developed pulmonary embolism Also had right buttock ulceration and pressure injury.  She had C. difficile infection that was treated with 14 days of p.o. vancomycin. She also had morning cortisol at 1 time was low at 3.5.  There was no other signs of adrenal insufficiency and she was also normal hypertensive without hyponatremia so they did not  recommend any treatment.  She had some hypercalcemia which was thought to be due to prolonged immobilization.  She was given a dose of pamidronate on 08/15/2021.  Her diet was advanced to regular by discharge and TPN was discontinued. Patient was readmitted on 09/27/2020 for bloody enterocutaneous fistula output.  CT abdomen also showed fluid collection with possible intra-abdominal abscess connecting to the fistula..  Neck malnutrition for which the PICC team was consulted and placed PICC on 09/29/2020 and TPN was started.  The plan was to continue TPN at home.  At the time of discharge the patient was tolerating a regular diet, requiring no analgesic or antiemetic medication and plan was to give her nightly TPN. She was seen in the general surgery outpatient clinic on 11/11/2020 and there was a plan for colostomy reversal on December 19, 2020.  During that visit she had reported that she had a great appetite and was eating at least 3 times per day in addition to nocturnal TPN.  It was noted that her midline EC fistula had closed.  Her albumin was low at 2.7.  But she had gained 3 kg weight since her last visit.  Patient has had some chills whenever she was doing TPN for the past few weeks.  She initially ignored it and then she told her home nurse.  As she was shivering when TPN was being infused the [facility] pharmacy had asked her not to continue with infusion and asked her to go to the ED for evaluation.   So patient came to [facility] ED.  On 3/29 as she was stable labs were drawn including blood culture and she was sent home. She was asked to come back on 11/26/2020 because the blood culture  was positive for VRE. On 11/26/2020 vitals in the ED 106/60, temperature 98.1, pulse 58, sats 98%. WBC 8.4, Hb 9.2, platelet 322, creatinine 1.33. Blood cultures were repeated.  And PICC line was removed.  I am seeing the patient for the bacteremia. Patient says she is feeling fine.  She only has she was when she is infusing the  TPN. She has baseline cough which has not worsened She is eating well She lives on her own and has home help 3 hours a day. She is able to move around she says She has been taking her HIV medications regularly.  She is on [employer].    Past Medical History:  Diagnosis Date  . Acute GI hemorrhage 12/2015   required transfusion  . Allergic rhinitis   . Arthritis    DDD  . Asthma   . Blepharitis   . Bronchitis   . Cigarette smoker   . Claustrophobia   . COPD (chronic obstructive pulmonary disease) ([place])   . Cough   . DDD (degenerative disc disease), lumbar 04/10/2014  . Depression   . Diverticulosis   . Ectopic pregnancy   . Eye irritation   . Genital herpes   . GERD (gastroesophageal reflux disease)   . Headache   . HIV (human immunodeficiency virus infection) ([place])   . Hypertension   . Paresthesia of hand, bilateral   . Shortness of breath dyspnea    at times due to asthma    Past Surgical History:  Procedure Laterality Date  . BACK SURGERY  12/2014   lumbar lam. Dr. Deri Fuelling  . BACK SURGERY  12/25/2012   Removal lumbosubarachnoid shunt system, L5-S1 TF ESI, Dr. Virl Axe Minchew  . back surgery may 2016     lumbar   . BUNIONECTOMY Bilateral    feet  . COLONOSCOPY  12/06/2014   rescheduled from 11/01/2014 due to poor prep  . DIAGNOSTIC LAPAROSCOPY    . DIALYSIS/PERMA CATHETER INSERTION N/A 04/25/2020   Procedure: DIALYSIS/PERMA CATHETER INSERTION;  Surgeon: Algernon Huxley, MD;  Location: [place] [facility];  Service: Cardiovascular;  Laterality: N/A;  . DIALYSIS/PERMA CATHETER REMOVAL N/A 05/23/2020   Procedure: DIALYSIS/PERMA CATHETER REMOVAL;  Surgeon: Algernon Huxley, MD;  Location: [place] [facility];  Service: Cardiovascular;  Laterality: N/A;  . DISTAL FEMUR OSTEOTOMY Right    Dr. Earnestine Leys  . ESOPHAGOGASTRODUODENOSCOPY Left 01/11/2016   Procedure: ESOPHAGOGASTRODUODENOSCOPY (EGD);  Surgeon: Hulen Luster, MD;  Location: [facility] ENDOSCOPY;  Service: Endoscopy;   Laterality: Left;  . Finger fracture Right    5th finger  . FRACTURE SURGERY Right 01/25/2014   closed reduction & percutaneous pinning of 5th digit  . HAND SURGERY Bilateral    carpal tunnel  . JOINT REPLACEMENT Bilateral    knees  . KNEE SURGERY Bilateral 2003,2007   total Joint  . LAPAROSCOPY FOR ECTOPIC PREGNANCY    . PERIPHERAL VASCULAR CATHETERIZATION N/A 01/12/2016   Procedure: Visceral Angiography;  Surgeon: Algernon Huxley, MD;  Location: [place] [facility];  Service: Cardiovascular;  Laterality: N/A;  . PERIPHERAL VASCULAR CATHETERIZATION N/A 01/12/2016   Procedure: Visceral Artery Intervention;  Surgeon: Algernon Huxley, MD;  Location: [place] [facility];  Service: Cardiovascular;  Laterality: N/A;  . REVISION TOTAL KNEE ARTHROPLASTY Right 12/31/2002   Dr. Marry Guan  . TUBAL LIGATION    . VIDEO BRONCHOSCOPY N/A 06/30/2015   Procedure: VIDEO BRONCHOSCOPY WITHOUT FLUORO;  Surgeon: Vilinda Boehringer, MD;  Location: [facility] ORS;  Service: Cardiopulmonary;  Laterality: N/A;    Social History   Socioeconomic History  . Marital status: Single    Spouse name: Not on file  . Number of children: Not on file  . Years of education: Not on file  . Highest education level: Not on file  Occupational History  . Not on file  Tobacco Use  . Smoking status: Former Smoker    Packs/day: 0.25    Years: 38.00    Pack years: 9.50    Types: Cigarettes  . Smokeless tobacco: Never Used  . Tobacco comment: 1 cig daily  Vaping Use  . Vaping Use: Never used  Substance and Sexual Activity  . Alcohol use: Not Currently    Alcohol/week: 5.0 standard drinks    Types: 5 Glasses of wine per week    Comment: occ  . Drug use: Not Currently  . Sexual activity: Not on file  Other Topics Concern  . Not on file  Social History Narrative  . Not on file   Social Determinants of Health   Financial Resource Strain: Not on file  Food Insecurity: Not on file  Transportation Needs: Not on file  Physical  Activity: Not on file  Stress: Not on file  Social Connections: Not on file  Intimate Partner Violence: Not on file    Family History  Problem Relation Age of Onset  . Breast cancer Sister    Allergies  Allergen Reactions  . Acetaminophen Nausea And Vomiting  . Ibuprofen Other (See Comments)    Reports causes bleeding  . Gabapentin Rash  . Morphine Itching and Rash  . Morphine And Related Itching  . Zanaflex  [Tizanidine] Rash   I? Current Facility-Administered Medications  Medication Dose Route Frequency Provider Last Rate Last Admin  . acetaminophen (TYLENOL) tablet 650 mg  650 mg Oral Q6H PRN Irene Pap N, DO      . DAPTOmycin (CUBICIN) 750 mg in sodium chloride 0.9 % IVPB  750 mg Intravenous Q2000 Tawnya Crook, RPH 230 mL/hr at 11/26/20 1959 750 mg at 11/26/20 1959  . dextrose 5 % in lactated ringers infusion   Intravenous Continuous Kayleen Memos, DO 50 mL/hr at 11/26/20 1957 New Bag at 11/26/20 1957  . enoxaparin (LOVENOX) injection 40 mg  40 mg Subcutaneous Q24H Irene Pap N, DO   40 mg at 11/26/20 2006  . [START ON 11/27/2020] famotidine (PEPCID) tablet 20 mg  20 mg Oral Daily Hall, Carole N, DO      . FLUoxetine (PROZAC) capsule 20 mg  20 mg Oral Daily [place], Carole N, DO      . HYDROmorphone (DILAUDID) injection 0.5-1 mg  0.5-1 mg Intravenous Q4H PRN Opyd, Ilene Qua, MD   1 mg at 11/26/20 2018  . mometasone-formoterol (DULERA) 200-5 MCG/ACT inhaler 2 puff  2 puff Inhalation BID Irene Pap N, DO      . ondansetron (ZOFRAN) injection 4 mg  4 mg Intravenous Q6H PRN Kayleen Memos, DO      . [START ON 11/27/2020] pantoprazole (PROTONIX) EC tablet 40 mg  40 mg Oral Daily Irene Pap N, DO         Abtx:  Anti-infectives (From admission, onward)   Start     Dose/Rate Route Frequency Ordered Stop   11/26/20 1545  DAPTOmycin (CUBICIN) 750 mg in sodium chloride 0.9 % IVPB        750 mg 230 mL/hr over 30 Minutes Intravenous Daily 11/26/20 1535  REVIEW OF  SYSTEMS:  Const: negative fever, ++ chills,  Eyes: negative diplopia or visual changes, negative eye pain ENT: negative coryza, negative sore throat Resp: negative cough, hemoptysis, dyspnea Cards: negative for chest pain, palpitations, lower extremity edema GU: negative for frequency, dysuria and hematuria GI: Negative for abdominal pain, has colostomy.  Skin: negative for rash and pruritus Heme: negative for easy bruising and gum/nose bleeding MS: General weakness. Neurolo:negative for headaches, dizziness, vertigo, memory problems  Psych: negative for feelings of anxiety, depression  Endocrine: negative for thyroid, diabetes Allergy/Immunology-multiple allergies as listed above. Objective:  VITALS:  BP 106/60 (BP Location: Right Arm)   Pulse (!) 58   Temp 98.1 F (36.7 C)   Resp 18   Ht '5\' 6"'$  (1.676 m)   Wt 92 kg   SpO2 98%   BMI 32.74 kg/m  PHYSICAL EXAM:  General: Alert, cooperative, no distress, appears stated age.  Head: Normocephalic, without obvious abnormality, atraumatic. Eyes: Conjunctivae clear, anicteric sclerae. Pupils are equal ENT Nares normal. No drainage or sinus tenderness. Lips, mucosa, and tongue normal. No Thrush Neck: Supple, symmetrical, no adenopathy, thyroid: non tender no carotid bruit and no JVD. Lungs: Bilateral air entry.  Decreased bases. Heart: S1-S2 Abdomen: Soft, colostomy. Extremities: Left arm PICC line has been removed.  The site has scaling and dryness Skin: No rashes or lesions. Or bruising Lymph: Cervical, supraclavicular normal. Neurologic: Grossly non-focal Pertinent Labs Lab Results CBC    Component Value Date/Time   WBC 8.4 11/26/2020 1614   RBC 3.03 (L) 11/26/2020 1614   HGB 9.2 (L) 11/26/2020 1614   HGB 7.8 (L) 05/03/2020 0636   HCT 28.6 (L) 11/26/2020 1614   HCT 23.6 (L) 05/03/2020 0636   PLT 322 11/26/2020 1614   PLT 131 (L) 05/03/2020 0636   MCV 94.4 11/26/2020 1614   MCV 93 05/03/2020 0636   MCV 100 12/21/2014  0710   MCH 30.4 11/26/2020 1614   MCHC 32.2 11/26/2020 1614   RDW 17.0 (H) 11/26/2020 1614   RDW 14.8 05/03/2020 0636   RDW 14.0 12/21/2014 0710   LYMPHSABS 1.8 11/26/2020 1614   LYMPHSABS 0.6 (L) 05/03/2020 0636   LYMPHSABS 2.3 12/14/2014 2023   MONOABS 1.1 (H) 11/26/2020 1614   MONOABS 0.8 12/14/2014 2023   EOSABS 0.4 11/26/2020 1614   EOSABS 0.2 05/03/2020 0636   EOSABS 0.7 12/14/2014 2023   BASOSABS 0.0 11/26/2020 1614   BASOSABS 0.1 05/03/2020 0636   BASOSABS 0.1 12/14/2014 2023    CMP Latest Ref Rng & Units 11/26/2020 11/25/2020 10/30/2020  Glucose 70 - 99 mg/dL 95 107(H) 84  BUN 6 - 20 mg/dL 24(H) 29(H) 27(H)  Creatinine 0.44 - 1.00 mg/dL 1.33(H) 1.34(H) 1.39(H)  Sodium 135 - 145 mmol/L 135 133(L) 133(L)  Potassium 3.5 - 5.1 mmol/L 4.2 4.0 3.8  Chloride 98 - 111 mmol/L 109 107 106  CO2 22 - 32 mmol/L 22 20(L) 21(L)  Calcium 8.9 - 10.3 mg/dL 8.8(L) 8.3(L) 9.0  Total Protein 6.5 - 8.1 g/dL 7.4 - 7.0  Total Bilirubin 0.3 - 1.2 mg/dL 0.6 - 0.7  Alkaline Phos 38 - 126 U/L 126 - 88  AST 15 - 41 U/L 33 - 32  ALT 0 - 44 U/L 26 - 15      Microbiology: Recent Results (from the past 240 hour(s))  Culture, blood (single) w Reflex to ID Panel     Status: None (Preliminary result)   Collection Time: 11/25/20  7:22 PM   Specimen: BLOOD LEFT  HAND  Result Value Ref Range Status   Specimen Description   Final    BLOOD LEFT HAND Performed at [facility], [place] [address] [place], [place] [address]    Special Requests   Final    BOTTLES DRAWN AEROBIC AND ANAEROBIC Blood Culture adequate volume   Culture  Setup Time   Final    Organism ID to follow GRAM POSITIVE COCCI IN BOTH AEROBIC AND ANAEROBIC BOTTLES CRITICAL RESULT CALLED TO, READ BACK BY AND VERIFIED WITH: Thereasa Solo, RN AT 1313 11/26/20 [place] Performed at [facility], [address] [place], [place] [address]    Culture GRAM POSITIVE COCCI  Final   Report Status PENDING  Incomplete  Blood Culture ID  Panel (Reflexed)     Status: Abnormal   Collection Time: 11/25/20  7:22 PM  Result Value Ref Range Status   Enterococcus faecalis DETECTED (A) NOT DETECTED Final    Comment: CRITICAL RESULT CALLED TO, READ BACK BY AND VERIFIED WITH:  STEPHANIE RUDD AT [id number] 11/26/20 SDR    Enterococcus Faecium NOT DETECTED NOT DETECTED Final   Listeria monocytogenes NOT DETECTED NOT DETECTED Final   Staphylococcus species NOT DETECTED NOT DETECTED Final   Staphylococcus aureus (BCID) NOT DETECTED NOT DETECTED Final   Staphylococcus epidermidis NOT DETECTED NOT DETECTED Final   Staphylococcus lugdunensis NOT DETECTED NOT DETECTED Final   Streptococcus species NOT DETECTED NOT DETECTED Final   Streptococcus agalactiae NOT DETECTED NOT DETECTED Final   Streptococcus pneumoniae NOT DETECTED NOT DETECTED Final   Streptococcus pyogenes NOT DETECTED NOT DETECTED Final   A.calcoaceticus-baumannii NOT DETECTED NOT DETECTED Final   Bacteroides fragilis NOT DETECTED NOT DETECTED Final   Enterobacterales NOT DETECTED NOT DETECTED Final   Enterobacter cloacae complex NOT DETECTED NOT DETECTED Final   Escherichia coli NOT DETECTED NOT DETECTED Final   Klebsiella aerogenes NOT DETECTED NOT DETECTED Final   Klebsiella oxytoca NOT DETECTED NOT DETECTED Final   Klebsiella pneumoniae NOT DETECTED NOT DETECTED Final   Proteus species NOT DETECTED NOT DETECTED Final   Salmonella species NOT DETECTED NOT DETECTED Final   Serratia marcescens NOT DETECTED NOT DETECTED Final   Haemophilus influenzae NOT DETECTED NOT DETECTED Final   Neisseria meningitidis NOT DETECTED NOT DETECTED Final   Pseudomonas aeruginosa NOT DETECTED NOT DETECTED Final   Stenotrophomonas maltophilia NOT DETECTED NOT DETECTED Final   Candida albicans NOT DETECTED NOT DETECTED Final   Candida auris NOT DETECTED NOT DETECTED Final   Candida glabrata NOT DETECTED NOT DETECTED Final   Candida krusei NOT DETECTED NOT DETECTED Final   Candida parapsilosis  NOT DETECTED NOT DETECTED Final   Candida tropicalis NOT DETECTED NOT DETECTED Final   Cryptococcus neoformans/gattii NOT DETECTED NOT DETECTED Final   Vancomycin resistance DETECTED (A) NOT DETECTED Final    Comment: CRITICAL RESULT CALLED TO, READ BACK BY AND VERIFIED WITHColletta Maryland RUDD AT [id number] 11/26/20 SDR Performed at [facility], [address] [place], [place] [address]     IMAGING RESULTS: No bowel obstruction or ileus. The colostomy left mid abdomen. No bowel wall thickening or inflammatory change.   No free fluid or free gas. Peri-incisional hernias are seen within the midline abdomen, containing small portions of the mid jejunum. No evidence of incarceration or obstruction. I have personally reviewed the films ? Impression/Recommendation Vancomycin resistant Enterococcus faecalis bacteremia.  Until susceptibilities noted patient will be on daptomycin. PICC line has been removed. She has had PICC line for the past 2  months. We need to get a 2D echo/TEE to rule out endocarditis. We will repeat blood cultures until clear of bacteremia As patient is taking adequate nutrition orally should avoid PICC line.  Will discuss with surgery.  HIV ?on Biktarvy.  Last viral load was less than 20 in September 2021.  CD4 count was 301.  Can repeat labs.  Continue Biktarvy.  Complicated intra-abdominal pathology. Perforated diverticulitis with intra-abdominal abscess, septic shock, multiorgan failure in August/September 2021 had drain placement Then in November 2021 underwent Hartman's procedure with end colostomy at [facility], complicated by enterocutaneous fistula.  She is being on TPN nocturnally and also eating.  The EC fistula has been closed.  She last saw her surgeon on 315 and there was a plan to reverse the colostomy in April 2022.  Anemia of chronic disease  AKI on CKD.  No need for Augmentin to treat the CT findings of lower lobe bronchial wall thickening.  Patient is not  symptomatic.  Would recommend incentive spirometry. ___________________________________________________ Discussed with patient, requesting provider Note:  This document was prepared using Dragon voice recognition software and may include unintentional dictation errors.

## 2020-11-27 ENCOUNTER — Inpatient Hospital Stay (HOSPITAL_COMMUNITY)
Admit: 2020-11-27 | Discharge: 2020-11-27 | Disposition: A | Discharge status: Home / Self Care | Payer: Medicare Other | Attending: Internal Medicine | Admitting: Internal Medicine

## 2020-11-27 DIAGNOSIS — R7881 Bacteremia: Secondary | ICD-10-CM

## 2020-11-27 LAB — COMPREHENSIVE METABOLIC PANEL
ALT: 23 U/L (ref 0–44)
AST: 28 U/L (ref 15–41)
Albumin: 2.4 g/dL — ABNORMAL LOW (ref 3.5–5.0)
Alkaline Phosphatase: 112 U/L (ref 38–126)
Anion gap: 5 (ref 5–15)
BUN: 18 mg/dL (ref 6–20)
CO2: 21 mmol/L — ABNORMAL LOW (ref 22–32)
Calcium: 8.7 mg/dL — ABNORMAL LOW (ref 8.9–10.3)
Chloride: 110 mmol/L (ref 98–111)
Creatinine, Ser: 1.22 mg/dL — ABNORMAL HIGH (ref 0.44–1.00)
GFR, Estimated: 51 mL/min — ABNORMAL LOW (ref >60)
Glucose, Bld: 95 mg/dL (ref 70–99)
Potassium: 4.4 mmol/L (ref 3.5–5.1)
Sodium: 136 mmol/L (ref 135–145)
Total Bilirubin: 0.7 mg/dL (ref 0.3–1.2)
Total Protein: 6.7 g/dL (ref 6.5–8.1)

## 2020-11-27 LAB — PROCALCITONIN: Procalcitonin: 9.06 ng/mL

## 2020-11-27 LAB — BLOOD CULTURE ID PANEL (REFLEXED) - BCID2
A.calcoaceticus-baumannii: NOT DETECTED
Bacteroides fragilis: NOT DETECTED
Candida albicans: NOT DETECTED
Candida auris: NOT DETECTED
Candida glabrata: NOT DETECTED
Candida krusei: NOT DETECTED
Candida parapsilosis: NOT DETECTED
Candida tropicalis: NOT DETECTED
Cryptococcus neoformans/gattii: NOT DETECTED
Enterobacter cloacae complex: NOT DETECTED
Enterobacterales: NOT DETECTED
Enterococcus Faecium: NOT DETECTED
Enterococcus faecalis: DETECTED — AB
Escherichia coli: NOT DETECTED
Haemophilus influenzae: NOT DETECTED
Klebsiella aerogenes: NOT DETECTED
Klebsiella oxytoca: NOT DETECTED
Klebsiella pneumoniae: NOT DETECTED
Listeria monocytogenes: NOT DETECTED
Neisseria meningitidis: NOT DETECTED
Proteus species: NOT DETECTED
Pseudomonas aeruginosa: NOT DETECTED
Salmonella species: NOT DETECTED
Serratia marcescens: NOT DETECTED
Staphylococcus aureus (BCID): NOT DETECTED
Staphylococcus epidermidis: NOT DETECTED
Staphylococcus lugdunensis: NOT DETECTED
Staphylococcus species: NOT DETECTED
Stenotrophomonas maltophilia: NOT DETECTED
Streptococcus agalactiae: NOT DETECTED
Streptococcus pneumoniae: NOT DETECTED
Streptococcus pyogenes: NOT DETECTED
Streptococcus species: NOT DETECTED
Vancomycin resistance: DETECTED — AB

## 2020-11-27 LAB — CBC WITH DIFFERENTIAL/PLATELET
Abs Immature Granulocytes: 0.04 10*3/uL (ref 0.00–0.07)
Basophils Absolute: 0 10*3/uL (ref 0.0–0.1)
Basophils Relative: 1 %
Eosinophils Absolute: 0.4 10*3/uL (ref 0.0–0.5)
Eosinophils Relative: 6 %
HCT: 28.7 % — ABNORMAL LOW (ref 36.0–46.0)
Hemoglobin: 9.3 g/dL — ABNORMAL LOW (ref 12.0–15.0)
Immature Granulocytes: 1 %
Lymphocytes Relative: 26 %
Lymphs Abs: 1.9 10*3/uL (ref 0.7–4.0)
MCH: 30.1 pg (ref 26.0–34.0)
MCHC: 32.4 g/dL (ref 30.0–36.0)
MCV: 92.9 fL (ref 80.0–100.0)
Monocytes Absolute: 0.9 10*3/uL (ref 0.1–1.0)
Monocytes Relative: 13 %
Neutro Abs: 4 10*3/uL (ref 1.7–7.7)
Neutrophils Relative %: 53 %
Platelets: 337 10*3/uL (ref 150–400)
RBC: 3.09 MIL/uL — ABNORMAL LOW (ref 3.87–5.11)
RDW: 17.2 % — ABNORMAL HIGH (ref 11.5–15.5)
WBC: 7.4 10*3/uL (ref 4.0–10.5)
nRBC: 0 % (ref 0.0–0.2)

## 2020-11-27 LAB — PHOSPHORUS: Phosphorus: 4 mg/dL (ref 2.5–4.6)

## 2020-11-27 LAB — ECHOCARDIOGRAM COMPLETE
AR max vel: 2.17 cm2
AV Area VTI: 2.17 cm2
AV Area mean vel: 2.32 cm2
AV Mean grad: 11 mmHg
AV Peak grad: 19.2 mmHg
Ao pk vel: 2.19 m/s
Area-P 1/2: 3.27 cm2
Height: 66 in
MV VTI: 3.97 cm2
P 1/2 time: 287 msec
S' Lateral: 3 cm
Weight: 3245.17 oz

## 2020-11-27 LAB — SARS CORONAVIRUS 2 (TAT 6-24 HRS): SARS Coronavirus 2: NEGATIVE

## 2020-11-27 LAB — MAGNESIUM: Magnesium: 1.8 mg/dL (ref 1.7–2.4)

## 2020-11-27 MED ORDER — MELATONIN 5 MG PO TABS
10.0000 mg | ORAL_TABLET | Freq: Every day | ORAL | Status: DC
  Administered 2020-11-27 – 2020-12-01 (×5): 10 mg via ORAL
  Filled 2020-11-27 (×5): qty 2

## 2020-11-27 MED ORDER — ENSURE ENLIVE PO LIQD: 237.0000 mL | Freq: Two times a day (BID) | ORAL | Status: DC

## 2020-11-27 MED ORDER — HYDROMORPHONE HCL 1 MG/ML IJ SOLN
0.5000 mg | Freq: Three times a day (TID) | INTRAMUSCULAR | Status: AC | PRN
  Administered 2020-11-27 – 2020-11-29 (×6): 0.5 mg via INTRAVENOUS
  Filled 2020-11-27 (×6): qty 1

## 2020-11-27 MED ORDER — BICTEGRAVIR-EMTRICITAB-TENOFOV 50-200-25 MG PO TABS
1.0000 | ORAL_TABLET | Freq: Every day | ORAL | Status: DC
  Administered 2020-11-28 – 2020-12-02 (×5): 1 via ORAL
  Filled 2020-11-27 (×6): qty 1

## 2020-11-27 MED ORDER — POLYETHYLENE GLYCOL 3350 17 G PO PACK
17.0000 g | PACK | Freq: Every day | ORAL | Status: DC
  Administered 2020-11-27 – 2020-12-02 (×6): 17 g via ORAL
  Filled 2020-11-27 (×6): qty 1

## 2020-11-27 MED ORDER — BISACODYL 5 MG PO TBEC
10.0000 mg | DELAYED_RELEASE_TABLET | Freq: Every day | ORAL | Status: DC | PRN
  Administered 2020-11-27 – 2020-11-30 (×3): 10 mg via ORAL
  Filled 2020-11-27 (×4): qty 2

## 2020-11-27 MED ORDER — GABAPENTIN 600 MG PO TABS
600.0000 mg | ORAL_TABLET | Freq: Three times a day (TID) | ORAL | Status: DC | PRN
  Administered 2020-12-02: 600 mg via ORAL
  Filled 2020-11-27 (×2): qty 1

## 2020-11-27 MED ORDER — ADULT MULTIVITAMIN W/MINERALS CH
1.0000 | ORAL_TABLET | Freq: Every day | ORAL | Status: DC
  Administered 2020-11-27: 1 via ORAL

## 2020-11-27 MED ORDER — ATORVASTATIN CALCIUM 10 MG PO TABS
20.0000 mg | ORAL_TABLET | Freq: Every day | ORAL | Status: DC
  Administered 2020-11-27 – 2020-12-02 (×6): 20 mg via ORAL
  Filled 2020-11-27: qty 1
  Filled 2020-11-27: qty 2
  Filled 2020-11-27 (×4): qty 1

## 2020-11-27 MED ORDER — OXYCODONE HCL 5 MG PO TABS
10.0000 mg | ORAL_TABLET | Freq: Four times a day (QID) | ORAL | Status: DC | PRN
  Administered 2020-11-27 – 2020-12-02 (×15): 10 mg via ORAL
  Filled 2020-11-27 (×16): qty 2

## 2020-11-27 MED ORDER — MELATONIN 5 MG PO TABS
10.0000 mg | ORAL_TABLET | Freq: Every day | ORAL | Status: DC
  Filled 2020-11-27: qty 2

## 2020-11-27 MED ORDER — ADULT MULTIVITAMIN W/MINERALS CH
1.0000 | ORAL_TABLET | Freq: Every day | ORAL | Status: DC
  Administered 2020-11-28 – 2020-12-01 (×5): 1 via ORAL
  Filled 2020-11-27 (×7): qty 1

## 2020-11-27 MED ORDER — OXYCODONE HCL 5 MG PO TABS
5.0000 mg | ORAL_TABLET | Freq: Four times a day (QID) | ORAL | Status: DC | PRN
Start: 2020-11-27 — End: 2020-11-27
  Administered 2020-11-27: 09:00:00 5 mg via ORAL
  Filled 2020-11-27: qty 1

## 2020-11-27 NOTE — Progress Notes (Signed)
PHARMACY - PHYSICIAN COMMUNICATION CRITICAL VALUE ALERT - BLOOD CULTURE IDENTIFICATION (BCID)  Melanie Bowen is an 60 y.o. female who presented to [facility] on 11/26/2020 with a chief complaint of chills and likely PICC line infection  Assessment:  Blood culture x1 from 3/29 with GPC and BCID detected E faecalis as well as Lucianne Lei A/B gene detected.  Patient called to come back to ED, repeat blood cultures from 3/30 now GPC in 3/4 bottles with BCID again detecting E faecalis as well as Lucianne Lei A/B gene detected. PICC line removed 3/30.  ID aware and will be seeing patient  Name of physician (or Provider) Contacted: Dr Delaine Lame  Current antibiotics: Daptomycin   Changes to prescribed antibiotics recommended:  Patient is on recommended antibiotics - No changes needed  Results for orders placed or performed during the hospital encounter of 11/26/20  Blood Culture ID Panel (Reflexed) (Collected: 11/26/2020  5:40 PM)  Result Value Ref Range   Enterococcus faecalis DETECTED (A) NOT DETECTED   Enterococcus Faecium NOT DETECTED NOT DETECTED   Listeria monocytogenes NOT DETECTED NOT DETECTED   Staphylococcus species NOT DETECTED NOT DETECTED   Staphylococcus aureus (BCID) NOT DETECTED NOT DETECTED   Staphylococcus epidermidis NOT DETECTED NOT DETECTED   Staphylococcus lugdunensis NOT DETECTED NOT DETECTED   Streptococcus species NOT DETECTED NOT DETECTED   Streptococcus agalactiae NOT DETECTED NOT DETECTED   Streptococcus pneumoniae NOT DETECTED NOT DETECTED   Streptococcus pyogenes NOT DETECTED NOT DETECTED   A.calcoaceticus-baumannii NOT DETECTED NOT DETECTED   Bacteroides fragilis NOT DETECTED NOT DETECTED   Enterobacterales NOT DETECTED NOT DETECTED   Enterobacter cloacae complex NOT DETECTED NOT DETECTED   Escherichia coli NOT DETECTED NOT DETECTED   Klebsiella aerogenes NOT DETECTED NOT DETECTED   Klebsiella oxytoca NOT DETECTED NOT DETECTED   Klebsiella pneumoniae NOT DETECTED NOT  DETECTED   Proteus species NOT DETECTED NOT DETECTED   Salmonella species NOT DETECTED NOT DETECTED   Serratia marcescens NOT DETECTED NOT DETECTED   Haemophilus influenzae NOT DETECTED NOT DETECTED   Neisseria meningitidis NOT DETECTED NOT DETECTED   Pseudomonas aeruginosa NOT DETECTED NOT DETECTED   Stenotrophomonas maltophilia NOT DETECTED NOT DETECTED   Candida albicans NOT DETECTED NOT DETECTED   Candida auris NOT DETECTED NOT DETECTED   Candida glabrata NOT DETECTED NOT DETECTED   Candida krusei NOT DETECTED NOT DETECTED   Candida parapsilosis NOT DETECTED NOT DETECTED   Candida tropicalis NOT DETECTED NOT DETECTED   Cryptococcus neoformans/gattii NOT DETECTED NOT DETECTED   Vancomycin resistance DETECTED (A) NOT DETECTED   Doreene Eland, PharmD, BCPS.   Work Cell: [phone] 11/27/2020 11:19 AM

## 2020-11-27 NOTE — Progress Notes (Addendum)
[facility] Progress Note  Patient: Melanie Bowen    [id number]  DOA: 11/26/2020     Date of Service: the patient was seen and examined on 11/27/2020  Chief Complaint  Patient presents with  . abnormal labs   Brief hospital course: Melanie VANMARTER is a 60 y.o. female with medical history significant for COPD not on oxygen supplementation at baseline, GERD, essential hypertension, HIV on HAART, CVA, PE now off anticoagulation, depression, seizure disorder, perforated diverticulitis and abscess status post Hartman's in November 2021, enterocutaneous fistula and, malnutrition with TPN dependence who presented to [facility] ED after receiving a call back from the emergency department due to a positive blood culture.  Patient presented initially the day prior to this admission due to complaints of chills for the past 2 days.  Patient had blood cultures drawn in the ED that resulted today 1 out of 2 peripheral blood cultures positive for vancomycin-resistant Enterococcus faecalis.  Patient has a PICC line which she uses for her TPN.  EDP contacted infectious disease who recommended starting IV daptomycin.  Blood cultures were repeated in the ED today.  EDP requested admission for VRE bacteremia.  ED Course:  Temperature 98.8, BP 122/66, pulse 74, O2 saturation 96% on room air.  Lab studies remarkable for creatinine 1.3 with baseline of 0.9, procalcitonin 13, WBC 8.4K from 12.3K yesterday, hemoglobin 9.8K, platelet count 322K.  Assessment and Plan: Present on Admission: . Bacteremia  Active Problems:   Bacteremia  Vancomycin resistant Enterococcus faecalis bacteremia, POA Blood cultures drawn peripherally on 11/25/2020 revealed vancomycin resistant Enterococcus faecalis 1 out of 2 bottles, patient called at home to return to the ED. Repeated blood cultures x2 on 11/26/2020, ordered by EDP, currently in process. Unclear source of bacteremia History of EC fistula ID consulted by EDP,  recommended IV daptomycin. Procalcitonin elevated 13, repeat procalcitonin level9.0  Replace PICC line once repeat blood cultures are negative. 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 Aspiration could give a similar appearance. N.p.o. until cleared by general surgery. LR D5 at 75 cc/h while NPO.  AKI Baseline creatinine appears to be 0.9 with GFR greater than 60 Presented with creatinine of 1.3 with GFR 46. Start gentle IV fluid hydration Avoid nephrotoxic agents and dehydration Monitor urine output Repeat renal panel monitor with  HIV on HAART Management per infectious disease Resume home HAART  Chronic anxiety/depression/GERD/COPD not on oxygen supplementation Resume home regimen  History of PE Off oral anticoagulation Monitor Currently O2 saturation 100% on room air.   Body mass index is 32.74 kg/m.  Interventions:        Diet: TPN via PICC line DVT Prophylaxis: Subcutaneous Lovenox   Advance goals of care discussion: Full code  Family Communication: family was NOT present at bedside, at the time of interview.  The pt provided permission to discuss medical plan with the family. Opportunity was given to ask question and all questions were answered satisfactorily.   Disposition:  Pt is from Home, admitted with bacteremia, repeat blood cultures are pending,  which precludes a safe discharge. Discharge to home, when cleared by  ID.   Consults : Infectious disease, general surgery.  Secure chat to request consult to Dr. Lutricia Feil general surgery and Dr. Steva Ready infectious disease was sent by admitting physician.    Subjective: No overnight issues, patient was called and due to bacteremia.  Patient was complaining of chronic pain in the back and right shoulder, she takes Percocet at home and requesting oxycodone and IV Dilaudid.  Patient also having some constipation and ileostomy bag seems to be working. Denied any abdominal pain, no nausea vomiting.  Physical Exam: General:  alert oriented to time, place, and person.  Appear in mild distress, affect appropriate Eyes: PERRLA ENT: Oral Mucosa Clear, moist  Neck: no JVD,  Cardiovascular: S1 and S2 Present, no Murmur,  Respiratory: good respiratory effort, Bilateral Air entry equal and Decreased, no Crackles, no wheezes Abdomen: Bowel Sound present, Soft and no tenderness, ileostomy bag attached, slight amount of liquidy stool present Skin: no rashes Extremities: no Pedal edema, no calf tenderness Neurologic: without any new focal findings Gait not checked due to patient safety concerns  Vitals:   11/27/20 0017 11/27/20 0539 11/27/20 0809 11/27/20 1156  BP: (!) 98/48 (!) 101/50 (!) 107/54 (!) 110/52  Pulse: 69 73 68 76  Resp: '16 17 17 16  '$ Temp: 98 F (36.7 C) 97.9 F (36.6 C) 98.5 F (36.9 C) (!) 97.5 F (36.4 C)  TempSrc:  Oral Oral Oral  SpO2: 98% 99% 100% 98%  Weight:      Height:        Intake/Output Summary (Last 24 hours) at 11/27/2020 1332 Last data filed at 11/27/2020 1103 Gross per 24 hour  Intake 718.14 ml  Output 100 ml  Net 618.14 ml   Filed Weights   11/26/20 1457  Weight: 92 kg    Data Reviewed: I have personally reviewed and interpreted daily labs, tele strips, imagings as discussed above. I reviewed all nursing notes, pharmacy notes, vitals, pertinent old records I have discussed plan of care as described above with RN and  patient/family.  CBC: Recent Labs  Lab 11/25/20 1922 11/26/20 1614 11/27/20 0523  WBC 12.3* 8.4 7.4  NEUTROABS 9.8* 5.0 4.0  HGB 9.8* 9.2* 9.3*  HCT 28.3* 28.6* 28.7*  MCV 89.6 94.4 92.9  PLT 332 322 [id number]   Basic Metabolic Panel: Recent Labs  Lab 11/25/20 1922 11/26/20 1614 11/27/20 0523  NA 133* 135 136  K 4.0 4.2 4.4  CL 107 109 110  CO2 20* 22 21*  GLUCOSE 107* 95 95  BUN 29* 24* 18  CREATININE 1.34* 1.33* 1.22*  CALCIUM 8.3* 8.8* 8.7*  MG  --   --  1.8  PHOS  --   --  4.0    Studies: CT CHEST ABDOMEN PELVIS WO CONTRAST  Result Date: 11/26/2020 CLINICAL DATA:  Bacteremia, PICC line infection EXAM: CT CHEST, ABDOMEN AND PELVIS WITHOUT CONTRAST TECHNIQUE: Multidetector CT imaging of the chest, abdomen and pelvis was performed following the standard protocol without IV contrast. Unenhanced CT was performed per clinician order. Lack of IV contrast limits sensitivity and specificity, especially for evaluation of abdominal/pelvic solid viscera. COMPARISON:  06/09/2020 FINDINGS: CT CHEST FINDINGS Cardiovascular: Unenhanced imaging of the heart and great vessels demonstrates no pericardial effusion. Atherosclerosis of the aortic arch and coronary vessels. No evidence of thoracic aortic aneurysm. Mediastinum/Nodes: No enlarged mediastinal, hilar, or axillary lymph nodes. Thyroid gland, trachea, and esophagus demonstrate no significant findings. Lungs/Pleura: There is  mild bilateral lower lobe bronchial wall thickening with scattered tree in bud airspace disease consistent with bronchitis and bronchopneumonia. Aspiration could give a similar appearance. Subpleural scarring and fibrosis noted, most pronounced within the apices. No effusion or pneumothorax. The central airways are patent. Musculoskeletal: No acute or destructive bony lesions. Severe degenerative changes of the right shoulder. CT ABDOMEN PELVIS FINDINGS Hepatobiliary: Stable hyperdense material along the hepatic capsule of  the left lobe, which may reflect calcification or previous barium extravasation. Otherwise the liver is unremarkable. Gallbladder is decompressed, with no evidence of cholelithiasis or cholecystitis. Pancreas: Unremarkable. No pancreatic ductal dilatation or surrounding inflammatory changes. Spleen: Normal in size without focal abnormality. Adrenals/Urinary Tract: No urinary tract calculi or obstructive uropathy. The bladder is decompressed which limits its evaluation. The adrenals are unremarkable. Stomach/Bowel: No bowel obstruction or ileus. The colostomy left mid abdomen. No bowel wall thickening or inflammatory change. Normal appendix right lower quadrant. Vascular/Lymphatic: Extensive atherosclerosis of the aorta and its branches. No pathologic adenopathy. Reproductive: Uterus and bilateral adnexa are unremarkable. Other: No free fluid or free gas. Peri-incisional hernias are seen within the midline abdomen, containing small portions of the mid jejunum. No evidence of incarceration or obstruction. Musculoskeletal: Postsurgical changes from L3 through L5 from previous discectomy and posterior fusion. No acute or destructive bony lesions. Reconstructed images demonstrate no additional findings. IMPRESSION: 1. Mild bilateral lower lobe bronchial wall thickening with scattered tree in bud airspace disease consistent with bronchitis and bronchopneumonia. Aspiration could give a similar appearance. 2. Peri-incisional hernias within the midline abdomen, containing small portions of the mid jejunum. No evidence of incarceration or obstruction. 3. Aortic Atherosclerosis (ICD10-I70.0). Electronically Signed   By: Randa Ngo M.D.   On: 11/26/2020 18:42    Scheduled Meds: . amoxicillin-clavulanate  1 tablet Oral Q12H  . bictegravir-emtricitabine-tenofovir AF  1 tablet Oral Daily  . enoxaparin (LOVENOX) injection  40 mg Subcutaneous Q24H  . famotidine  20 mg Oral Daily  . feeding supplement  237 mL Oral BID BM   . mometasone-formoterol  2 puff Inhalation BID  . multivitamin with minerals  1 tablet Oral Daily  . pantoprazole  40 mg Oral Daily  . polyethylene glycol  17 g Oral Daily   Continuous Infusions: . DAPTOmycin (CUBICIN)  IV 750 mg (11/26/20 1959)  . dextrose 5% lactated ringers 50 mL/hr at 11/27/20 1108   PRN Meds: acetaminophen, bisacodyl, HYDROmorphone (DILAUDID) injection, ondansetron (ZOFRAN) IV, oxyCODONE  Time spent: 35 minutes  Author: Val Riles. MD [facility] 11/27/2020 1:32 PM  To reach On-call, see care teams to locate the attending and reach out to them via [email or web address]. If 7PM-7AM, please contact night-coverage If you still have difficulty reaching the attending provider, please page the [facility] (Director on Call) for [facility] on amion for assistance.

## 2020-11-27 NOTE — Progress Notes (Signed)
Telephone call from laboratory verbalizing that he had a corrected Procalcitonin due to instrument error; the correct result should be <0.10 and this result is now in [facility]

## 2020-11-27 NOTE — Consult Note (Addendum)
[place] Nurse ostomy consult note Consult requested to assist with leaking ostomy pouch.  Pt had surgery at [facility] in 11/21 and states she is scheduled for a revision in a few weeks. She and her daughter perform ostomy care prior to admission and she has been using a flat 2 piece pouching system. Discussed use of convexity, barrier ring, and belt to attempt to maintain a seal.  Stoma type/location:  Current pouch is leaking; stoma is red and viable, slightly below skin level, 1 inch.  There is a significant crease at 3:00 o'clock and 9:00 o'clock which occurs beside the stoma when she leans forward and creates a difficult pouching situation. There is a similar crease just below the stoma on the lower abd.  Peristomal assessment: intact Output: mod amt liquid brown stool Ostomy pouching: 1pc.  Education provided: Applied barrier ring and one piece flexible convex pouch and barrier strips to the edges. Pt declines wanting to use a belt despite the fact that she states she realizes it would help keep the pouch in place.  Extra supplies left at the bedside and instructions provided for bedside nurses to assist with pouch application; 2 flexible convex pouches, 2 barrier rings, 4 bextra barrier strips, powder, skin prep wipes and a belt (Flexible one piece convex pouch, [id number])  Apply belt if patient requests. Please re-consult if further assistance is needed.  Thank-you,  Julien Girt MSN, [place], [place], [place], [place]

## 2020-11-27 NOTE — TOC Progression Note (Signed)
Transition of Care [facility]) - Progression Note    Patient Details  Name: Melanie Bowen MRN: [id number] Date of Birth: 19-Nov-1960  Transition of [facility]) CM/SW Contact  Su Hilt, RN Phone Number: 11/27/2020, 2:23 PM  Clinical Narrative:   The patient provided with the phone number to the [place] and infusion company, [facility] health phone number [phone], click for pharmacy, then click for [place].  CM called and spoke with Tiffany, they arrange all services around TPN and PICC line, they will also arrange for any needed IV ABX at home, They do not do compounds on the weekends.  I notified Tiffany that the PICC line was removed.  Will provide Tiffany with any Home infusion orders to fax number [phone]   Expected Discharge Plan: [place] Barriers to Discharge: Continued Medical Work up  Expected Discharge Plan and Services Expected Discharge Plan: [place]   Discharge Planning Services: CM Consult,Mobile Meals   Living arrangements for the past 2 months: Single Family Home                 DME Arranged: N/A         HH Arranged: NA           Social Determinants of Health (SDOH) Interventions    Readmission Risk Interventions Readmission Risk Prevention Plan 05/23/2020 11/08/2019  Transportation Screening Complete Complete  Medication Review [employer] photographer) Complete Complete  PCP or Specialist appointment within 3-5 days of discharge (No Data) Not Complete  PCP/Specialist Appt Not Complete comments - Her PCP is out for 2-3 weeks so other two providers are booked out. The receptionist will call her if there is a cancellation.  [place] or Home Care Consult (No Data) Complete  SW Recovery Care/Counseling Consult Complete Complete  Palliative Care Screening Complete Not Applicable  Skilled Nursing Facility Complete Not Applicable  Some recent data might be hidden

## 2020-11-27 NOTE — Progress Notes (Signed)
*  PRELIMINARY RESULTS* Echocardiogram 2D Echocardiogram has been performed.  Melanie Bowen 11/27/2020, 9:59 AM

## 2020-11-27 NOTE — Progress Notes (Signed)
Spoke with the patient about DC plan and needs She has DME at home and does not need more She stated that she lives alone She has a long Term PICC line and does TPN, She has [place] set up and can't remember the name of the company She has a PCA 2.5 hours a day She has transportation with family She stated that she needed meals on Wheels, CM called [employer] to make the referral, she is too young for meals on wheels.  CM called [employer] and provided the referral they plan to call her Next Tuesday to schedule delivery for Thursday. Provided them with the patient's name and contact information She stated that she has no additional needs

## 2020-11-27 NOTE — Progress Notes (Signed)
Initial Nutrition Assessment  DOCUMENTATION CODES:   Obesity unspecified  INTERVENTION:   -Ensure Enlive po BID, each supplement provides 350 kcal and 20 grams of protein -MVI with minerals daily -RD to monitor for ability to re-start TPN  NUTRITION DIAGNOSIS:   Increased nutrient needs related to chronic illness (EC fistula) as evidenced by estimated needs.  GOAL:   Patient will meet greater than or equal to 90% of their needs  MONITOR:   PO intake,Supplement acceptance,Diet advancement,Labs,Weight trends,Skin,I & O's  REASON FOR ASSESSMENT:   Malnutrition Screening Tool    ASSESSMENT:   Melanie Bowen is a 60 y.o. female with medical history significant for COPD not on oxygen supplementation at baseline, GERD, essential hypertension, HIV on HAART, CVA, PE now off anticoagulation, depression, seizure disorder, perforated diverticulitis and abscess status post Hartman's in November 2021, enterocutaneous fistula and, malnutrition with TPN dependence who presented to [facility] ED after receiving a call back from the emergency department due to a positive blood culture.  Pt admitted with vancomycin resistant enterococcus faecalis bacteremia.   3/30- PICC lined removed secondary to bacteremia.   Reviewed I/O's: -100 ml x 24 hours  Colostomy output: 100 ml x 24 hours  Pt unavailable at time of visit. Attempted to speak with pt via call to hospital room, however, unable to reach. Unable to obtain further nutrition-related history or complete nutrition-focused physical exam at this time.   Reviewed notes from [place]. Per notes, plan for colostomy reversal on 12/19/20. Pt consumes 3 meals per meal for comfort in addition to TPN (TPN in managed by [facility]). Pt is chronically dependent on TPN due top EC fistula, however, unsure of home regimen at this time. Noted that food particles often appear in colostomy bag, which pt empties several times per day, which is concerning for  malabsorption.   Reviewed wt hx; pt has experienced a 25% wt loss over the past 6 months, which is significant for time frame.   TPN currently on hold due to line holiday.  Medications reviewed and include miralax.   Labs reviewed.   Diet Order:   Diet Order            Diet Heart Room service appropriate? Yes; Fluid consistency: Thin  Diet effective now                 EDUCATION NEEDS:   No education needs have been identified at this time  Skin:  Skin Assessment: Reviewed RN Assessment  Last BM:  11/27/20 (30 ml output via colostomy)  Height:   Ht Readings from Last 1 Encounters:  11/26/20 '5\' 6"'$  (1.676 m)    Weight:   Wt Readings from Last 1 Encounters:  11/26/20 92 kg    Ideal Body Weight:  59.1 kg  BMI:  Body mass index is 32.74 kg/m.  Estimated Nutritional Needs:   Kcal:  2150-2350  Protein:  120-145 grams  Fluid:  > 2 L    Loistine Chance, RD, LDN, [place] Registered Dietitian II Certified Diabetes Care and Education Specialist Please refer to [facility] for RD and/or RD on-call/weekend/after hours pager

## 2020-11-28 DIAGNOSIS — D638 Anemia in other chronic diseases classified elsewhere: Secondary | ICD-10-CM | POA: Diagnosis not present

## 2020-11-28 DIAGNOSIS — R7881 Bacteremia: Secondary | ICD-10-CM

## 2020-11-28 DIAGNOSIS — N179 Acute kidney failure, unspecified: Secondary | ICD-10-CM | POA: Diagnosis not present

## 2020-11-28 DIAGNOSIS — Z2239 Carrier of other specified bacterial diseases: Secondary | ICD-10-CM

## 2020-11-28 DIAGNOSIS — D631 Anemia in chronic kidney disease: Secondary | ICD-10-CM

## 2020-11-28 DIAGNOSIS — N189 Chronic kidney disease, unspecified: Secondary | ICD-10-CM

## 2020-11-28 LAB — CBC WITH DIFFERENTIAL/PLATELET
Abs Immature Granulocytes: 0.04 10*3/uL (ref 0.00–0.07)
Basophils Absolute: 0 10*3/uL (ref 0.0–0.1)
Basophils Relative: 0 %
Eosinophils Absolute: 0.4 10*3/uL (ref 0.0–0.5)
Eosinophils Relative: 5 %
HCT: 26.3 % — ABNORMAL LOW (ref 36.0–46.0)
Hemoglobin: 8.7 g/dL — ABNORMAL LOW (ref 12.0–15.0)
Immature Granulocytes: 1 %
Lymphocytes Relative: 26 %
Lymphs Abs: 2 10*3/uL (ref 0.7–4.0)
MCH: 30.9 pg (ref 26.0–34.0)
MCHC: 33.1 g/dL (ref 30.0–36.0)
MCV: 93.3 fL (ref 80.0–100.0)
Monocytes Absolute: 0.9 10*3/uL (ref 0.1–1.0)
Monocytes Relative: 11 %
Neutro Abs: 4.3 10*3/uL (ref 1.7–7.7)
Neutrophils Relative %: 57 %
Platelets: 372 10*3/uL (ref 150–400)
RBC: 2.82 MIL/uL — ABNORMAL LOW (ref 3.87–5.11)
RDW: 17.3 % — ABNORMAL HIGH (ref 11.5–15.5)
WBC: 7.6 10*3/uL (ref 4.0–10.5)
nRBC: 0 % (ref 0.0–0.2)

## 2020-11-28 LAB — BASIC METABOLIC PANEL
Anion gap: 7 (ref 5–15)
BUN: 13 mg/dL (ref 6–20)
CO2: 19 mmol/L — ABNORMAL LOW (ref 22–32)
Calcium: 8.4 mg/dL — ABNORMAL LOW (ref 8.9–10.3)
Chloride: 109 mmol/L (ref 98–111)
Creatinine, Ser: 1.12 mg/dL — ABNORMAL HIGH (ref 0.44–1.00)
GFR, Estimated: 57 mL/min — ABNORMAL LOW (ref >60)
Glucose, Bld: 84 mg/dL (ref 70–99)
Potassium: 4 mmol/L (ref 3.5–5.1)
Sodium: 135 mmol/L (ref 135–145)

## 2020-11-28 LAB — PHOSPHORUS: Phosphorus: 4.1 mg/dL (ref 2.5–4.6)

## 2020-11-28 LAB — CULTURE, BLOOD (SINGLE): Special Requests: ADEQUATE

## 2020-11-28 LAB — MAGNESIUM: Magnesium: 1.8 mg/dL (ref 1.7–2.4)

## 2020-11-28 LAB — PROCALCITONIN: Procalcitonin: 4.34 ng/mL

## 2020-11-28 MED ORDER — SODIUM CHLORIDE 0.9 % IV SOLN
2.0000 g | INTRAVENOUS | Status: DC
  Administered 2020-11-28 – 2020-12-02 (×25): 2 g via INTRAVENOUS
  Filled 2020-11-28 (×3): qty 2000
  Filled 2020-11-28 (×2): qty 2
  Filled 2020-11-28: qty 2000
  Filled 2020-11-28: qty 1.67
  Filled 2020-11-28 (×2): qty 2000
  Filled 2020-11-28: qty 2
  Filled 2020-11-28: qty 2000
  Filled 2020-11-28 (×2): qty 2
  Filled 2020-11-28 (×4): qty 2000
  Filled 2020-11-28: qty 2
  Filled 2020-11-28 (×3): qty 2000
  Filled 2020-11-28 (×2): qty 2
  Filled 2020-11-28: qty 2000
  Filled 2020-11-28 (×2): qty 2
  Filled 2020-11-28 (×3): qty 2000
  Filled 2020-11-28: qty 2
  Filled 2020-11-28: qty 1.67

## 2020-11-28 MED ORDER — DICLOFENAC SODIUM 1 % EX GEL
2.0000 g | Freq: Four times a day (QID) | CUTANEOUS | Status: DC
  Administered 2020-11-28 – 2020-12-02 (×9): 2 g via TOPICAL
  Filled 2020-11-28: qty 100

## 2020-11-28 MED ORDER — LIDOCAINE 5 % EX PTCH
1.0000 | MEDICATED_PATCH | CUTANEOUS | Status: DC
  Administered 2020-11-28 – 2020-12-02 (×5): 1 via TRANSDERMAL
  Filled 2020-11-28 (×6): qty 1

## 2020-11-28 NOTE — Progress Notes (Signed)
Date of Admission:  11/26/2020    ID: Melanie Bowen is a 60 y.o. female  Active Problems:   Bacteremia    Subjective: Doing better No fever  No chills Eating well  Medications:  . atorvastatin  20 mg Oral Daily  . bictegravir-emtricitabine-tenofovir AF  1 tablet Oral Daily  . diclofenac Sodium  2 g Topical QID  . enoxaparin (LOVENOX) injection  40 mg Subcutaneous Q24H  . famotidine  20 mg Oral Daily  . feeding supplement  237 mL Oral BID BM  . lidocaine  1 patch Transdermal Q24H  . melatonin  10 mg Oral QHS  . mometasone-formoterol  2 puff Inhalation BID  . multivitamin with minerals  1 tablet Oral Daily  . pantoprazole  40 mg Oral Daily  . polyethylene glycol  17 g Oral Daily    Objective: Vital signs in last 24 hours: Temp:  [97.5 F (36.4 C)-98.8 F (37.1 C)] 98.3 F (36.8 C) (04/01 0816) Pulse Rate:  [64-76] 66 (04/01 0917) Resp:  [14-20] 16 (04/01 0816) BP: (82-123)/(47-74) 116/61 (04/01 0917) SpO2:  [97 %-100 %] 100 % (04/01 0816)  PHYSICAL EXAM:  General: Alert, cooperative, no distress, appears stated age.  Head: Normocephalic, without obvious abnormality, atraumatic. Eyes: Conjunctivae clear, anicteric sclerae. Pupils are equal ENT Nares normal. No drainage or sinus tenderness. Lips, mucosa, and tongue normal. No Thrush Neck: Supple, symmetrical, no adenopathy, thyroid: non tender no carotid bruit and no JVD. Back: No CVA tenderness. Lungs: Clear to auscultation bilaterally. No Wheezing or Rhonchi. No rales. Heart: Regular rate and rhythm, no murmur, rub or gallop. Abdomen: colostomy  Bowel sounds normal. No masses Extremities: atraumatic, no cyanosis. No edema. No clubbing Skin: No rashes or lesions. Or bruising Lymph: Cervical, supraclavicular normal. Neurologic: Grossly non-focal  Lab Results Recent Labs    11/27/20 0523 11/28/20 0534  WBC 7.4 7.6  HGB 9.3* 8.7*  HCT 28.7* 26.3*  NA 136 135  K 4.4 4.0  CL 110 109  CO2 21* 19*  BUN  18 13  CREATININE 1.22* 1.12*   Liver Panel Recent Labs    11/26/20 1614 11/27/20 0523  PROT 7.4 6.7  ALBUMIN 2.6* 2.4*  AST 33 28  ALT 26 23  ALKPHOS 126 112  BILITOT 0.6 0.7  Microbiology: 3/29 Studies/Results: ECHOCARDIOGRAM COMPLETE  Result Date: 11/27/2020    ECHOCARDIOGRAM REPORT   Patient Name:   Melanie Bowen Date of Exam: 11/27/2020 Medical Rec #:  [id number]        Height:       66.0 in Accession #:    [id number]       Weight:       202.8 lb Date of Birth:  03/08/1961        BSA:          2.012 m Patient Age:    4 years         BP:           107/54 mmHg Patient Gender: F                HR:           72 bpm. Exam Location:  [facility] Procedure: 2D Echo, Color Doppler and Cardiac Doppler Indications:     R78.81 Bacteremia  History:         Patient has no prior history of Echocardiogram examinations.  COPD and HIV; Risk Factors:Hypertension and Current Smoker.  Sonographer:     Charmayne Sheer RDCS (AE) Referring Phys:  [id number] [place] Diagnosing Phys: Ida Rogue MD  Sonographer Comments: No subcostal window. IMPRESSIONS  1. Left ventricular ejection fraction, by estimation, is 60 to 65%. The left ventricle has normal function. The left ventricle has no regional wall motion abnormalities. There is mild left ventricular hypertrophy. Left ventricular diastolic parameters are consistent with Grade II diastolic dysfunction (pseudonormalization).  2. Right ventricular systolic function is normal. The right ventricular size is normal.  3. The mitral valve is normal in structure. Mild mitral valve regurgitation.  4. There is mild thickening of the aortic valve. Aortic valve regurgitation is mild to moderate. Mild to moderate aortic valve sclerosis/calcification is present, without any evidence of aortic stenosis. FINDINGS  Left Ventricle: Left ventricular ejection fraction, by estimation, is 60 to 65%. The left ventricle has normal function. The left ventricle has no regional  wall motion abnormalities. The left ventricular internal cavity size was normal in size. There is  mild left ventricular hypertrophy. Left ventricular diastolic parameters are consistent with Grade II diastolic dysfunction (pseudonormalization). Right Ventricle: The right ventricular size is normal. No increase in right ventricular wall thickness. Right ventricular systolic function is normal. Left Atrium: Left atrial size was normal in size. Right Atrium: Right atrial size was normal in size. Pericardium: There is no evidence of pericardial effusion. Mitral Valve: The mitral valve is normal in structure. Mild mitral valve regurgitation. No evidence of mitral valve stenosis. MV peak gradient, 6.0 mmHg. The mean mitral valve gradient is 3.0 mmHg. Tricuspid Valve: The tricuspid valve is normal in structure. Tricuspid valve regurgitation is not demonstrated. No evidence of tricuspid stenosis. Aortic Valve: The aortic valve is normal in structure. There is mild thickening of the aortic valve. Aortic valve regurgitation is mild to moderate. Aortic regurgitation PHT measures 287 msec. Mild to moderate aortic valve sclerosis/calcification is present, without any evidence of aortic stenosis. Aortic valve mean gradient measures 11.0 mmHg. Aortic valve peak gradient measures 19.2 mmHg. Aortic valve area, by VTI measures 2.17 cm. Pulmonic Valve: The pulmonic valve was normal in structure. Pulmonic valve regurgitation is not visualized. No evidence of pulmonic stenosis. Aorta: The aortic root is normal in size and structure. Venous: The inferior vena cava is normal in size with greater than 50% respiratory variability, suggesting right atrial pressure of 3 mmHg. IAS/Shunts: No atrial level shunt detected by color flow Doppler.  LEFT VENTRICLE PLAX 2D LVIDd:         5.20 cm  Diastology LVIDs:         3.00 cm  LV e' medial:    8.49 cm/s LV PW:         1.00 cm  LV E/e' medial:  11.8 LV IVS:        0.80 cm  LV e' lateral:   12.70  cm/s LVOT diam:     2.10 cm  LV E/e' lateral: 7.9 LV SV:         103 LV SV Index:   51 LVOT Area:     3.46 cm  RIGHT VENTRICLE RV Basal diam:  3.00 cm LEFT ATRIUM             Index       RIGHT ATRIUM           Index LA diam:        3.90 cm 1.94 cm/m  RA Area:  11.90 cm LA Vol (A2C):   61.4 ml 30.52 ml/m RA Volume:   24.90 ml  12.38 ml/m LA Vol (A4C):   71.7 ml 35.64 ml/m LA Biplane Vol: 70.6 ml 35.09 ml/m  AORTIC VALVE                    PULMONIC VALVE AV Area (Vmax):    2.17 cm     PV Vmax:       1.13 m/s AV Area (Vmean):   2.32 cm     PV Vmean:      72.900 cm/s AV Area (VTI):     2.17 cm     PV VTI:        0.221 m AV Vmax:           219.00 cm/s  PV Peak grad:  5.1 mmHg AV Vmean:          157.000 cm/s PV Mean grad:  3.0 mmHg AV VTI:            0.472 m AV Peak Grad:      19.2 mmHg AV Mean Grad:      11.0 mmHg LVOT Vmax:         137.00 cm/s LVOT Vmean:        105.000 cm/s LVOT VTI:          0.296 m LVOT/AV VTI ratio: 0.63 AI PHT:            287 msec  AORTA Ao Root diam: 3.30 cm MITRAL VALVE MV Area (PHT): 3.27 cm     SHUNTS MV Area VTI:   3.97 cm     Systemic VTI:  0.30 m MV Peak grad:  6.0 mmHg     Systemic Diam: 2.10 cm MV Mean grad:  3.0 mmHg MV Vmax:       1.22 m/s MV Vmean:      85.3 cm/s MV Decel Time: 232 msec MV E velocity: 100.00 cm/s MV A velocity: 74.10 cm/s MV E/A ratio:  1.35 Ida Rogue MD Electronically signed by Ida Rogue MD Signature Date/Time: 11/27/2020/3:19:39 PM    Final    CT CHEST ABDOMEN PELVIS WO CONTRAST  Result Date: 11/26/2020 CLINICAL DATA:  Bacteremia, PICC line infection EXAM: CT CHEST, ABDOMEN AND PELVIS WITHOUT CONTRAST TECHNIQUE: Multidetector CT imaging of the chest, abdomen and pelvis was performed following the standard protocol without IV contrast. Unenhanced CT was performed per clinician order. Lack of IV contrast limits sensitivity and specificity, especially for evaluation of abdominal/pelvic solid viscera. COMPARISON:  06/09/2020 FINDINGS: CT CHEST  FINDINGS Cardiovascular: Unenhanced imaging of the heart and great vessels demonstrates no pericardial effusion. Atherosclerosis of the aortic arch and coronary vessels. No evidence of thoracic aortic aneurysm. Mediastinum/Nodes: No enlarged mediastinal, hilar, or axillary lymph nodes. Thyroid gland, trachea, and esophagus demonstrate no significant findings. Lungs/Pleura: There is mild bilateral lower lobe bronchial wall thickening with scattered tree in bud airspace disease consistent with bronchitis and bronchopneumonia. Aspiration could give a similar appearance. Subpleural scarring and fibrosis noted, most pronounced within the apices. No effusion or pneumothorax. The central airways are patent. Musculoskeletal: No acute or destructive bony lesions. Severe degenerative changes of the right shoulder. CT ABDOMEN PELVIS FINDINGS Hepatobiliary: Stable hyperdense material along the hepatic capsule of the left lobe, which may reflect calcification or previous barium extravasation. Otherwise the liver is unremarkable. Gallbladder is decompressed, with no evidence of cholelithiasis or cholecystitis. Pancreas: Unremarkable. No pancreatic ductal dilatation or surrounding inflammatory changes. Spleen:  Normal in size without focal abnormality. Adrenals/Urinary Tract: No urinary tract calculi or obstructive uropathy. The bladder is decompressed which limits its evaluation. The adrenals are unremarkable. Stomach/Bowel: No bowel obstruction or ileus. The colostomy left mid abdomen. No bowel wall thickening or inflammatory change. Normal appendix right lower quadrant. Vascular/Lymphatic: Extensive atherosclerosis of the aorta and its branches. No pathologic adenopathy. Reproductive: Uterus and bilateral adnexa are unremarkable. Other: No free fluid or free gas. Peri-incisional hernias are seen within the midline abdomen, containing small portions of the mid jejunum. No evidence of incarceration or obstruction. Musculoskeletal:  Postsurgical changes from L3 through L5 from previous discectomy and posterior fusion. No acute or destructive bony lesions. Reconstructed images demonstrate no additional findings. IMPRESSION: 1. Mild bilateral lower lobe bronchial wall thickening with scattered tree in bud airspace disease consistent with bronchitis and bronchopneumonia. Aspiration could give a similar appearance. 2. Peri-incisional hernias within the midline abdomen, containing small portions of the mid jejunum. No evidence of incarceration or obstruction. 3. Aortic Atherosclerosis (ICD10-I70.0). Electronically Signed   By: Randa Ngo M.D.   On: 11/26/2020 18:42     Assessment/Plan: Enterococcus faecalis bacteremia.  Susceptible to ampicillin.  We will change daptomycin to ampicillin.  This is because of the PICC line she had for 2 months.  The PICC has been removed.  2D echo done yesterday showed some thickening of the aortic valve with mild to moderate aortic regurgitation.  Patient needs a TEE to rule out endocarditis.  If TEE is negative then she will need 2 weeks of IV ampicillin.  If TEE is positive then she will need 4 to 6 weeks of IV ampicillin plus ceftriaxone. If the repeat culture that was sent from 11/27/2020 is negative tomorrow she can get a new PICC line and home IV antibiotics.  HIV on Biktarvy  Complicated intra-abdominal pathology. Perforated diverticulitis with intra-abdominal abscess, septic shock, antitoxin failure in August/September 2021 and had drain placement.  Then in our 2021 she underwent Hartman's procedure with end colostomy at [facility].  This was complicated by enterocutaneous fistula .  She was on TPN  and also taking orally.  EC fistula has closed.  She last saw her surgeon November 11, 2020 and there was a plan to reverse the colostomy in April 2022.  Anemia of chronic disease  AKI on CKD  Discussed the management with patient and care team Will follow her peripherally this weekend

## 2020-11-28 NOTE — Progress Notes (Signed)
[facility] Progress Note  Patient: Melanie Bowen    [id number]  DOA: 11/26/2020     Date of Service: the patient was seen and examined on 11/28/2020  Chief Complaint  Patient presents with  . abnormal labs   Brief hospital course: Melanie Bowen is a 60 y.o. female with medical history significant for COPD not on oxygen supplementation at baseline, GERD, essential hypertension, HIV on HAART, CVA, PE now off anticoagulation, depression, seizure disorder, perforated diverticulitis and abscess status post Hartman's in November 2021, enterocutaneous fistula and, malnutrition with TPN dependence who presented to [facility] ED after receiving a call back from the emergency department due to a positive blood culture.  Patient presented initially the day prior to this admission due to complaints of chills for the past 2 days.  Patient had blood cultures drawn in the ED that resulted today 1 out of 2 peripheral blood cultures positive for vancomycin-resistant Enterococcus faecalis.  Patient has a PICC line which she uses for her TPN.  EDP contacted infectious disease who recommended starting IV daptomycin.  Blood cultures were repeated in the ED today.  EDP requested admission for VRE bacteremia.  ED Course:  Temperature 98.8, BP 122/66, pulse 74, O2 saturation 96% on room air.  Lab studies remarkable for creatinine 1.3 with baseline of 0.9, procalcitonin 13, WBC 8.4K from 12.3K yesterday, hemoglobin 9.8K, platelet count 322K.  Assessment and Plan: Present on Admission: . Bacteremia  Active Problems:   Bacteremia  Vancomycin resistant Enterococcus faecalis bacteremia, POA Blood cultures drawn peripherally on 11/25/2020 revealed vancomycin resistant Enterococcus faecalis 1 out of 2 bottles, patient called at home to return to the ED. 3/30 Repeated blood cultures x2 growing Enterococcus faecalis  Unclear source of bacteremia History of EC fistula ID consulted, s/p IV daptomycin, started  ampicillin 2 g IV every 4 hourly as per ID Procalcitonin elevated 13, repeat procalcitonin level9.0  PICC line was removed, replace PICC line once repeat blood cultures are negative. F/u ID for further recommendation TTE reviewed, LVEF [id number], grade 2 diastolic dysfunction, no endocarditis mentioned. ID recommended cardiac consult to rule out endocarditis, patient may need TEE 3/31 blood culture NGTD   Recent history of EC fistula post Hartmann's procedure Patient has had a history of diverticulitis with perforation and abscess Colostomy bag in place, history of EC fistula S/p TPN, d/c'd PICC line due to bacteremia.  As per general surgery patient does not need TPN. She reports she is able to eat some food CT a/p negative for any acute abdominal pathology, Mild bilateral lower lobe bronchial wall thickening with scattered tree in bud airspace disease consistent with bronchitis and bronchopneumonia. Aspiration could give a similar appearance.  Clinically no signs of pneumonia, Continue above antibiotic  AKI Baseline creatinine appears to be 0.9 with GFR greater than 60 Presented with creatinine of 1.3--1.12 gradually improved Start gentle IV fluid hydration Avoid nephrotoxic agents and dehydration Monitor urine output Repeat renal panel monitor with  HIV on HAART Management per infectious disease Resume home HAART  Chronic anxiety/depression/GERD/COPD not on oxygen supplementation Resume home regimen  History of PE Off oral anticoagulation Monitor Currently O2 saturation 100% on room air.   Body mass index is 32.74 kg/m.  Interventions:        Diet: Heart healthy diet DVT Prophylaxis: Subcutaneous Lovenox   Advance goals of care discussion: Full code  Family Communication: family was NOT present at bedside, at the time of interview.  The pt provided permission to discuss medical  plan with the family. Opportunity was given to ask question and all questions were  answered satisfactorily.   Disposition:  Pt is from Home, admitted with bacteremia, repeat blood cultures are pending, which precludes a safe discharge. Discharge to home, when cleared by ID.   Consults : Infectious disease, general surgery.  Secure chat to request consult to Dr. Lutricia Feil general surgery and Dr. Steva Ready infectious disease was sent by admitting physician.    Subjective: No overnight issues, patient has chronic lower back pain and right shoulder pain for which she is on Percocet, requesting IV Dilaudid which was discouraged.  Patient was eager to go home and wanted to know the plan, ID consulted to explain that how long patient is to stay in hospital on IV antibiotics and patient may need PICC line before she goes home. Patient denied any fever chills, no chest pain or palpitations, no shortness of breath.  Physical Exam: General:  alert oriented to time, place, and person.  Appear in mild distress, affect appropriate Eyes: PERRLA ENT: Oral Mucosa Clear, moist  Neck: no JVD,  Cardiovascular: S1 and S2 Present, no Murmur,  Respiratory: good respiratory effort, Bilateral Air entry equal and Decreased, no Crackles, no wheezes Abdomen: Bowel Sound present, Soft and no tenderness, ileostomy bag attached, slight amount of liquidy stool present Skin: no rashes Extremities: no Pedal edema, no calf tenderness Neurologic: without any new focal findings Gait not checked due to patient safety concerns  Vitals:   11/28/20 0035 11/28/20 0816 11/28/20 0917 11/28/20 1124  BP: (!) 112/49 (!) 82/55 116/61 129/60  Pulse: 66 64 66 65  Resp: '14 16  16  '$ Temp: 98.7 F (37.1 C) 98.3 F (36.8 C)  (!) 97.3 F (36.3 C)  TempSrc: Oral Oral  Oral  SpO2: 97% 100%  99%  Weight:      Height:        Intake/Output Summary (Last 24 hours) at 11/28/2020 1429 Last data filed at 11/27/2020 1957 Gross per 24 hour  Intake 329.88 ml  Output 0 ml  Net 329.88 ml   Filed Weights   11/26/20  1457  Weight: 92 kg    Data Reviewed: I have personally reviewed and interpreted daily labs, tele strips, imagings as discussed above. I reviewed all nursing notes, pharmacy notes, vitals, pertinent old records I have discussed plan of care as described above with RN and patient/family.  CBC: Recent Labs  Lab 11/25/20 1922 11/26/20 1614 11/27/20 0523 11/28/20 0534  WBC 12.3* 8.4 7.4 7.6  NEUTROABS 9.8* 5.0 4.0 4.3  HGB 9.8* 9.2* 9.3* 8.7*  HCT 28.3* 28.6* 28.7* 26.3*  MCV 89.6 94.4 92.9 93.3  PLT 332 322 337 [id number]   Basic Metabolic Panel: Recent Labs  Lab 11/25/20 1922 11/26/20 1614 11/27/20 0523 11/28/20 0534  NA 133* 135 136 135  K 4.0 4.2 4.4 4.0  CL 107 109 110 109  CO2 20* 22 21* 19*  GLUCOSE 107* 95 95 84  BUN 29* 24* 18 13  CREATININE 1.34* 1.33* 1.22* 1.12*  CALCIUM 8.3* 8.8* 8.7* 8.4*  MG  --   --  1.8 1.8  PHOS  --   --  4.0 4.1    Studies: No results found.  Scheduled Meds: . atorvastatin  20 mg Oral Daily  . bictegravir-emtricitabine-tenofovir AF  1 tablet Oral Daily  . diclofenac Sodium  2 g Topical QID  . enoxaparin (LOVENOX) injection  40 mg Subcutaneous Q24H  . famotidine  20 mg Oral Daily  .  lidocaine  1 patch Transdermal Q24H  . melatonin  10 mg Oral QHS  . mometasone-formoterol  2 puff Inhalation BID  . multivitamin with minerals  1 tablet Oral Daily  . pantoprazole  40 mg Oral Daily  . polyethylene glycol  17 g Oral Daily   Continuous Infusions: . ampicillin (OMNIPEN) IV    . dextrose 5% lactated ringers 50 mL/hr at 11/27/20 1108   PRN Meds: acetaminophen, bisacodyl, gabapentin, HYDROmorphone (DILAUDID) injection, ondansetron (ZOFRAN) IV, oxyCODONE  Time spent: 35 minutes  Author: Val Riles. MD [facility] 11/28/2020 2:29 PM  To reach On-call, see care teams to locate the attending and reach out to them via [email or web address]. If 7PM-7AM, please contact night-coverage If you still have difficulty reaching the attending  provider, please page the [facility] (Director on Call) for [facility] on amion for assistance.

## 2020-11-28 NOTE — Consult Note (Signed)
[facility] Cardiology  CARDIOLOGY CONSULT NOTE  Patient ID: Melanie Bowen MRN: [id number] DOB/AGE: 1960/11/02 60 y.o.  Admit date: 11/26/2020 Referring Physician Dwyane Dee Primary Physician [place] Primary Cardiologist  Reason for Consultation consider transesophageal echocardiogram  HPI: 60 year old female referred for transesophageal echocardiogram.  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  The patient has had a recent implicate history of perforated diverticulitis August through September 2021, and history of exploratory laparotomy, sigmoidectomy, end colostomy 06/30/2020, followed by cystourethroscopic he with indwelling ureteral stents.  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  The patient has been receiving TPN via PICC line which has been removed.  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  2D echocardiogram 11/27/2020 revealed normal left ventricular function, with LVEF 60 to 65%, with mild to moderate aortic insufficiency.  The patient has been seen by Dr. Wilford Grist / infectious disease who recommends transesophageal echocardiogram to rule out endocarditis.  Review of systems complete and found to be negative unless listed above     Past Medical History:  Diagnosis Date  . Acute GI hemorrhage 12/2015   required transfusion  . Allergic rhinitis   . Arthritis    DDD  . Asthma   . Blepharitis   . Bronchitis   . Cigarette smoker   . Claustrophobia   . COPD (chronic obstructive pulmonary disease) ([place])   . Cough   . DDD (degenerative disc disease), lumbar 04/10/2014  . Depression   . Diverticulosis   . Ectopic pregnancy   . Eye irritation   . Genital herpes   . GERD (gastroesophageal reflux disease)   . Headache   . HIV (human immunodeficiency virus  infection) ([place])   . Hypertension   . Paresthesia of hand, bilateral   . Shortness of breath dyspnea    at times due to asthma    Past Surgical History:  Procedure Laterality Date  . BACK SURGERY  12/2014   lumbar lam. Dr. Deri Fuelling  . BACK SURGERY  12/25/2012   Removal lumbosubarachnoid shunt system, L5-S1 TF ESI, Dr. Virl Axe Minchew  . back surgery may 2016     lumbar   . BUNIONECTOMY Bilateral    feet  . COLONOSCOPY  12/06/2014   rescheduled from 11/01/2014 due to poor prep  . DIAGNOSTIC LAPAROSCOPY    . DIALYSIS/PERMA CATHETER INSERTION N/A 04/25/2020   Procedure: DIALYSIS/PERMA CATHETER INSERTION;  Surgeon: Algernon Huxley, MD;  Location: [place] [facility];  Service: Cardiovascular;  Laterality: N/A;  . DIALYSIS/PERMA CATHETER REMOVAL N/A 05/23/2020   Procedure: DIALYSIS/PERMA CATHETER REMOVAL;  Surgeon: Algernon Huxley, MD;  Location: [place] [facility];  Service: Cardiovascular;  Laterality: N/A;  . DISTAL FEMUR OSTEOTOMY Right    Dr. Earnestine Leys  . ESOPHAGOGASTRODUODENOSCOPY Left 01/11/2016   Procedure: ESOPHAGOGASTRODUODENOSCOPY (EGD);  Surgeon: Hulen Luster, MD;  Location: [facility] ENDOSCOPY;  Service: Endoscopy;  Laterality: Left;  . Finger fracture Right    5th finger  . FRACTURE SURGERY Right 01/25/2014   closed reduction & percutaneous pinning of 5th digit  . HAND SURGERY Bilateral    carpal tunnel  . JOINT REPLACEMENT Bilateral    knees  . KNEE SURGERY Bilateral 2003,2007   total Joint  . LAPAROSCOPY FOR ECTOPIC PREGNANCY    . PERIPHERAL VASCULAR CATHETERIZATION N/A 01/12/2016   Procedure: Visceral Angiography;  Surgeon: Erskine Squibb  [name], MD;  Location: [place] [facility];  Service: Cardiovascular;  Laterality: N/A;  . PERIPHERAL VASCULAR CATHETERIZATION N/A 01/12/2016   Procedure: Visceral Artery Intervention;  Surgeon: Algernon Huxley, MD;  Location: [place] [facility];  Service: Cardiovascular;  Laterality: N/A;  . REVISION TOTAL KNEE ARTHROPLASTY Right 12/31/2002   Dr.  Marry Guan  . TUBAL LIGATION    . VIDEO BRONCHOSCOPY N/A 06/30/2015   Procedure: VIDEO BRONCHOSCOPY WITHOUT FLUORO;  Surgeon: Vilinda Boehringer, MD;  Location: [facility] ORS;  Service: Cardiopulmonary;  Laterality: N/A;    Medications Prior to Admission  Medication Sig Dispense Refill Last Dose  . atorvastatin (LIPITOR) 20 MG tablet Take 20 mg by mouth daily.     . bictegravir-emtricitabine-tenofovir AF (BIKTARVY) 50-200-25 MG TABS tablet Take 1 tablet by mouth daily.     Marland Kitchen DEXILANT 60 MG capsule Take 60 mg by mouth daily.    Past Week at Unknown time  . gabapentin (NEURONTIN) 600 MG tablet Take 600 mg by mouth 3 (three) times daily as needed.     . Melatonin 10 MG TABS Take 1 tablet by mouth daily.     Marland Kitchen oxyCODONE-acetaminophen (PERCOCET/ROXICET) 5-325 MG tablet Take 2 tablets by mouth every 4 (four) hours as needed for severe pain.     Marland Kitchen albuterol (VENTOLIN HFA) 108 (90 Base) MCG/ACT inhaler Inhale 2 puffs into the lungs every 6 (six) hours as needed for wheezing or shortness of breath. 6.7 g 0   . amiodarone (PACERONE) 200 MG tablet Take 1 tablet (200 mg total) by mouth daily for 21 days. (Patient not taking: Reported on 11/27/2020) 21 tablet 0 Not Taking at Unknown time  . ascorbic acid (VITAMIN C) 500 MG tablet Take 500 mg by mouth 2 (two) times daily. (Patient not taking: Reported on 11/27/2020)   Completed Course at Unknown time  . famotidine (PEPCID) 20 MG tablet Take 20 mg by mouth daily.      Marland Kitchen FLUoxetine (PROZAC) 20 MG capsule Take 20 mg by mouth daily. (Patient not taking: Reported on 11/27/2020)   Not Taking at Unknown time  . Multiple Vitamin (MULTIVITAMIN WITH MINERALS) TABS tablet Take 1 tablet by mouth daily.     . potassium chloride SA (KLOR-CON) 10 MEQ tablet Take 1 tablet (10 mEq total) by mouth daily for 7 days. (Patient not taking: Reported on 11/27/2020) 7 tablet 0 Not Taking at Unknown time  . WIXELA INHUB 250-50 MCG/DOSE AEPB Inhale 1 puff into the lungs 2 (two) times daily.      Social  History   Socioeconomic History  . Marital status: Single    Spouse name: Not on file  . Number of children: Not on file  . Years of education: Not on file  . Highest education level: Not on file  Occupational History  . Not on file  Tobacco Use  . Smoking status: Former Smoker    Packs/day: 0.25    Years: 38.00    Pack years: 9.50    Types: Cigarettes  . Smokeless tobacco: Never Used  . Tobacco comment: 1 cig daily  Vaping Use  . Vaping Use: Never used  Substance and Sexual Activity  . Alcohol use: Not Currently    Alcohol/week: 5.0 standard drinks    Types: 5 Glasses of wine per week    Comment: occ  . Drug use: Not Currently  . Sexual activity: Not on file  Other Topics Concern  . Not on file  Social History Narrative  . Not  on file   Social Determinants of Health   Financial Resource Strain: Not on file  Food Insecurity: Not on file  Transportation Needs: Not on file  Physical Activity: Not on file  Stress: Not on file  Social Connections: Not on file  Intimate Partner Violence: Not on file    Family History  Problem Relation Age of Onset  . Breast cancer Sister       Review of systems complete and found to be negative unless listed above      PHYSICAL EXAM  General: Well developed, well nourished, in no acute distress HEENT:  Normocephalic and atramatic Neck:  No JVD.  Lungs: Clear bilaterally to auscultation and percussion. Heart: HRRR . Normal S1 and S2 without gallops or murmurs.  Abdomen: Bowel sounds are positive, abdomen soft and non-tender  Msk:  Back normal, normal gait. Normal strength and tone for age. Extremities: No clubbing, cyanosis or edema.   Neuro: Alert and oriented X 3. Psych:  Good affect, responds appropriately  Labs:   Lab Results  Component Value Date   WBC 7.6 11/28/2020   HGB 8.7 (L) 11/28/2020   HCT 26.3 (L) 11/28/2020   MCV 93.3 11/28/2020   PLT 372 11/28/2020    Recent Labs  Lab 11/27/20 0523 11/28/20 0534   NA 136 135  K 4.4 4.0  CL 110 109  CO2 21* 19*  BUN 18 13  CREATININE 1.22* 1.12*  CALCIUM 8.7* 8.4*  PROT 6.7  --   BILITOT 0.7  --   ALKPHOS 112  --   ALT 23  --   AST 28  --   GLUCOSE 95 84   Lab Results  Component Value Date   CKTOTAL 21 (L) 11/26/2020   CKMB 1.3 04/15/2013   TROPONINI <0.03 03/23/2018   No results found for: CHOL No results found for: HDL No results found for: LDLCALC No results found for: TRIG No results found for: CHOLHDL No results found for: LDLDIRECT    Radiology: US Venous Img Upper Uni Left  Result Date: 10/30/2020 CLINICAL DATA:  Left arm pain history of PICC EXAM: Left UPPER EXTREMITY VENOUS DOPPLER ULTRASOUND TECHNIQUE: Gray-scale sonography with graded compression, as well as color Doppler and duplex ultrasound were performed to evaluate the upper extremity deep venous system from the level of the subclavian vein and including the jugular, axillary, basilic, radial, ulnar and upper cephalic vein. Spectral Doppler was utilized to evaluate flow at rest and with distal augmentation maneuvers. COMPARISON:  None. FINDINGS: Contralateral Subclavian Vein: Respiratory phasicity is normal and symmetric with the symptomatic side. No evidence of thrombus. Normal compressibility. Internal Jugular Vein: No evidence of thrombus. Normal compressibility, respiratory phasicity and response to augmentation. Subclavian Vein: No evidence of thrombus. Normal compressibility, respiratory phasicity and response to augmentation. Axillary Vein: No evidence of thrombus. Normal compressibility, respiratory phasicity and response to augmentation. Cephalic Vein: No evidence of thrombus. Normal compressibility, respiratory phasicity and response to augmentation. Basilic Vein: No evidence of thrombus. Normal compressibility, respiratory phasicity and response to augmentation. Brachial Veins: No evidence of thrombus. Normal compressibility, respiratory phasicity and response to  augmentation. Radial Veins: No evidence of thrombus.  Normal compressibility. Ulnar Veins: No evidence of thrombus.  Normal compressibility. IMPRESSION: No evidence of DVT within the left upper extremity. Electronically Signed   By: Donavan Foil M.D.   On: 10/30/2020 19:34   ECHOCARDIOGRAM COMPLETE  Result Date: 11/27/2020    ECHOCARDIOGRAM REPORT   Patient Name:   REGGINA OSTRANDER  Neyhart Date of Exam: 11/27/2020 Medical Rec #:  [id number]        Height:       66.0 in Accession #:    [id number]       Weight:       202.8 lb Date of Birth:  06-16-1961        BSA:          2.012 m Patient Age:    54 years         BP:           107/54 mmHg Patient Gender: F                HR:           72 bpm. Exam Location:  [facility] Procedure: 2D Echo, Color Doppler and Cardiac Doppler Indications:     R78.81 Bacteremia  History:         Patient has no prior history of Echocardiogram examinations.                  COPD and HIV; Risk Factors:Hypertension and Current Smoker.  Sonographer:     Charmayne Sheer RDCS (AE) Referring Phys:  [id number] [place] Diagnosing Phys: Ida Rogue MD  Sonographer Comments: No subcostal window. IMPRESSIONS  1. Left ventricular ejection fraction, by estimation, is 60 to 65%. The left ventricle has normal function. The left ventricle has no regional wall motion abnormalities. There is mild left ventricular hypertrophy. Left ventricular diastolic parameters are consistent with Grade II diastolic dysfunction (pseudonormalization).  2. Right ventricular systolic function is normal. The right ventricular size is normal.  3. The mitral valve is normal in structure. Mild mitral valve regurgitation.  4. There is mild thickening of the aortic valve. Aortic valve regurgitation is mild to moderate. Mild to moderate aortic valve sclerosis/calcification is present, without any evidence of aortic stenosis. FINDINGS  Left Ventricle: Left ventricular ejection fraction, by estimation, is 60 to 65%. The left ventricle has normal  function. The left ventricle has no regional wall motion abnormalities. The left ventricular internal cavity size was normal in size. There is  mild left ventricular hypertrophy. Left ventricular diastolic parameters are consistent with Grade II diastolic dysfunction (pseudonormalization). Right Ventricle: The right ventricular size is normal. No increase in right ventricular wall thickness. Right ventricular systolic function is normal. Left Atrium: Left atrial size was normal in size. Right Atrium: Right atrial size was normal in size. Pericardium: There is no evidence of pericardial effusion. Mitral Valve: The mitral valve is normal in structure. Mild mitral valve regurgitation. No evidence of mitral valve stenosis. MV peak gradient, 6.0 mmHg. The mean mitral valve gradient is 3.0 mmHg. Tricuspid Valve: The tricuspid valve is normal in structure. Tricuspid valve regurgitation is not demonstrated. No evidence of tricuspid stenosis. Aortic Valve: The aortic valve is normal in structure. There is mild thickening of the aortic valve. Aortic valve regurgitation is mild to moderate. Aortic regurgitation PHT measures 287 msec. Mild to moderate aortic valve sclerosis/calcification is present, without any evidence of aortic stenosis. Aortic valve mean gradient measures 11.0 mmHg. Aortic valve peak gradient measures 19.2 mmHg. Aortic valve area, by VTI measures 2.17 cm. Pulmonic Valve: The pulmonic valve was normal in structure. Pulmonic valve regurgitation is not visualized. No evidence of pulmonic stenosis. Aorta: The aortic root is normal in size and structure. Venous: The inferior vena cava is normal in size with greater than 50% respiratory variability, suggesting right atrial pressure of 3 mmHg.  IAS/Shunts: No atrial level shunt detected by color flow Doppler.  LEFT VENTRICLE PLAX 2D LVIDd:         5.20 cm  Diastology LVIDs:         3.00 cm  LV e' medial:    8.49 cm/s LV PW:         1.00 cm  LV E/e' medial:  11.8 LV  IVS:        0.80 cm  LV e' lateral:   12.70 cm/s LVOT diam:     2.10 cm  LV E/e' lateral: 7.9 LV SV:         103 LV SV Index:   51 LVOT Area:     3.46 cm  RIGHT VENTRICLE RV Basal diam:  3.00 cm LEFT ATRIUM             Index       RIGHT ATRIUM           Index LA diam:        3.90 cm 1.94 cm/m  RA Area:     11.90 cm LA Vol (A2C):   61.4 ml 30.52 ml/m RA Volume:   24.90 ml  12.38 ml/m LA Vol (A4C):   71.7 ml 35.64 ml/m LA Biplane Vol: 70.6 ml 35.09 ml/m  AORTIC VALVE                    PULMONIC VALVE AV Area (Vmax):    2.17 cm     PV Vmax:       1.13 m/s AV Area (Vmean):   2.32 cm     PV Vmean:      72.900 cm/s AV Area (VTI):     2.17 cm     PV VTI:        0.221 m AV Vmax:           219.00 cm/s  PV Peak grad:  5.1 mmHg AV Vmean:          157.000 cm/s PV Mean grad:  3.0 mmHg AV VTI:            0.472 m AV Peak Grad:      19.2 mmHg AV Mean Grad:      11.0 mmHg LVOT Vmax:         137.00 cm/s LVOT Vmean:        105.000 cm/s LVOT VTI:          0.296 m LVOT/AV VTI ratio: 0.63 AI PHT:            287 msec  AORTA Ao Root diam: 3.30 cm MITRAL VALVE MV Area (PHT): 3.27 cm     SHUNTS MV Area VTI:   3.97 cm     Systemic VTI:  0.30 m MV Peak grad:  6.0 mmHg     Systemic Diam: 2.10 cm MV Mean grad:  3.0 mmHg MV Vmax:       1.22 m/s MV Vmean:      85.3 cm/s MV Decel Time: 232 msec MV E velocity: 100.00 cm/s MV A velocity: 74.10 cm/s MV E/A ratio:  1.35 Ida Rogue MD Electronically signed by Ida Rogue MD Signature Date/Time: 11/27/2020/3:19:39 PM    Final    CT CHEST ABDOMEN PELVIS WO CONTRAST  Result Date: 11/26/2020 CLINICAL DATA:  Bacteremia, PICC line infection EXAM: CT CHEST, ABDOMEN AND PELVIS WITHOUT CONTRAST TECHNIQUE: Multidetector CT imaging of the chest, abdomen and pelvis was performed following the standard protocol without IV  contrast. Unenhanced CT was performed per clinician order. Lack of IV contrast limits sensitivity and specificity, especially for evaluation of abdominal/pelvic solid viscera.  COMPARISON:  06/09/2020 FINDINGS: CT CHEST FINDINGS Cardiovascular: Unenhanced imaging of the heart and great vessels demonstrates no pericardial effusion. Atherosclerosis of the aortic arch and coronary vessels. No evidence of thoracic aortic aneurysm. Mediastinum/Nodes: No enlarged mediastinal, hilar, or axillary lymph nodes. Thyroid gland, trachea, and esophagus demonstrate no significant findings. Lungs/Pleura: There is mild bilateral lower lobe bronchial wall thickening with scattered tree in bud airspace disease consistent with bronchitis and bronchopneumonia. Aspiration could give a similar appearance. Subpleural scarring and fibrosis noted, most pronounced within the apices. No effusion or pneumothorax. The central airways are patent. Musculoskeletal: No acute or destructive bony lesions. Severe degenerative changes of the right shoulder. CT ABDOMEN PELVIS FINDINGS Hepatobiliary: Stable hyperdense material along the hepatic capsule of the left lobe, which may reflect calcification or previous barium extravasation. Otherwise the liver is unremarkable. Gallbladder is decompressed, with no evidence of cholelithiasis or cholecystitis. Pancreas: Unremarkable. No pancreatic ductal dilatation or surrounding inflammatory changes. Spleen: Normal in size without focal abnormality. Adrenals/Urinary Tract: No urinary tract calculi or obstructive uropathy. The bladder is decompressed which limits its evaluation. The adrenals are unremarkable. Stomach/Bowel: No bowel obstruction or ileus. The colostomy left mid abdomen. No bowel wall thickening or inflammatory change. Normal appendix right lower quadrant. Vascular/Lymphatic: Extensive atherosclerosis of the aorta and its branches. No pathologic adenopathy. Reproductive: Uterus and bilateral adnexa are unremarkable. Other: No free fluid or free gas. Peri-incisional hernias are seen within the midline abdomen, containing small portions of the mid jejunum. No evidence of  incarceration or obstruction. Musculoskeletal: Postsurgical changes from L3 through L5 from previous discectomy and posterior fusion. No acute or destructive bony lesions. Reconstructed images demonstrate no additional findings. IMPRESSION: 1. Mild bilateral lower lobe bronchial wall thickening with scattered tree in bud airspace disease consistent with bronchitis and bronchopneumonia. Aspiration could give a similar appearance. 2. Peri-incisional hernias within the midline abdomen, containing small portions of the mid jejunum. No evidence of incarceration or obstruction. 3. Aortic Atherosclerosis (ICD10-I70.0). Electronically Signed   By: Randa Ngo M.D.   On: 11/26/2020 18:42    EKG:   ASSESSMENT AND PLAN:   1.  Vancomycin-resistant Enterococcus faecalis bacteremia, without evidence for endocarditis on surface 2D echocardiogram 2.  Essential hypertension, blood pressure in normal range without BP medications 3.  COPD, stable, oxygen saturation 98% on room air  Recommendations  1.  Proceed with transesophageal echocardiogram tentatively scheduled for 12/01/2020.  The risk, benefits and alternatives of TEE were explained to the patient and informed written consent was obtained.  Signed: Isaias Cowman MD,PhD, [facility] 11/28/2020, 3:23 PM

## 2020-11-28 NOTE — Care Management Important Message (Signed)
Important Message  Patient Details  Name: Melanie Bowen MRN: [id number] Date of Birth: 1960-12-27   Medicare Important Message Given:  N/A - LOS <3 / Initial given by admissions     [name] A Locklan Canoy 11/28/2020, 8:21 AM

## 2020-11-29 DIAGNOSIS — Z2239 Carrier of other specified bacterial diseases: Secondary | ICD-10-CM

## 2020-11-29 LAB — BASIC METABOLIC PANEL
Anion gap: 7 (ref 5–15)
BUN: 11 mg/dL (ref 6–20)
CO2: 21 mmol/L — ABNORMAL LOW (ref 22–32)
Calcium: 8.5 mg/dL — ABNORMAL LOW (ref 8.9–10.3)
Chloride: 109 mmol/L (ref 98–111)
Creatinine, Ser: 1.08 mg/dL — ABNORMAL HIGH (ref 0.44–1.00)
GFR, Estimated: 60 mL/min — ABNORMAL LOW (ref >60)
Glucose, Bld: 90 mg/dL (ref 70–99)
Potassium: 3.8 mmol/L (ref 3.5–5.1)
Sodium: 137 mmol/L (ref 135–145)

## 2020-11-29 LAB — PHOSPHORUS: Phosphorus: 4.4 mg/dL (ref 2.5–4.6)

## 2020-11-29 LAB — CBC
HCT: 25.3 % — ABNORMAL LOW (ref 36.0–46.0)
Hemoglobin: 8.2 g/dL — ABNORMAL LOW (ref 12.0–15.0)
MCH: 30.5 pg (ref 26.0–34.0)
MCHC: 32.4 g/dL (ref 30.0–36.0)
MCV: 94.1 fL (ref 80.0–100.0)
Platelets: 399 10*3/uL (ref 150–400)
RBC: 2.69 MIL/uL — ABNORMAL LOW (ref 3.87–5.11)
RDW: 16.8 % — ABNORMAL HIGH (ref 11.5–15.5)
WBC: 6.1 10*3/uL (ref 4.0–10.5)
nRBC: 0 % (ref 0.0–0.2)

## 2020-11-29 LAB — CULTURE, BLOOD (ROUTINE X 2)
Special Requests: ADEQUATE
Special Requests: ADEQUATE

## 2020-11-29 LAB — HIV-1 RNA QUANT-NO REFLEX-BLD
HIV 1 RNA Quant: <20 copies/mL
LOG10 HIV-1 RNA: UNDETERMINED log10copy/mL

## 2020-11-29 LAB — MAGNESIUM: Magnesium: 1.8 mg/dL (ref 1.7–2.4)

## 2020-11-29 NOTE — Progress Notes (Signed)
[facility] Cardiology  SUBJECTIVE: Patient doing well, denies chest pain or shortness of breath   Vitals:   11/28/20 2107 11/28/20 2108 11/29/20 0447 11/29/20 0747  BP: (!) 107/47 (!) 109/58 (!) 95/52 (!) 110/57  Pulse: 75 77 64 62  Resp:   17 18  Temp:   98.2 F (36.8 C) 98.3 F (36.8 C)  TempSrc:   Oral   SpO2:   98% 98%  Weight:      Height:         Intake/Output Summary (Last 24 hours) at 11/29/2020 [id number] Last data filed at 11/29/2020 0600 Gross per 24 hour  Intake 2072.81 ml  Output 1150 ml  Net 922.81 ml      PHYSICAL EXAM  General: Well developed, well nourished, in no acute distress HEENT:  Normocephalic and atramatic Neck:  No JVD.  Lungs: Clear bilaterally to auscultation and percussion. Heart: HRRR . Normal S1 and S2 without gallops or murmurs.  Abdomen: Bowel sounds are positive, abdomen soft and non-tender  Msk:  Back normal, normal gait. Normal strength and tone for age. Extremities: No clubbing, cyanosis or edema.   Neuro: Alert and oriented X 3. Psych:  Good affect, responds appropriately   LABS: Basic Metabolic Panel: Recent Labs    11/28/20 0534 11/29/20 0526  NA 135 137  K 4.0 3.8  CL 109 109  CO2 19* 21*  GLUCOSE 84 90  BUN 13 11  CREATININE 1.12* 1.08*  CALCIUM 8.4* 8.5*  MG 1.8 1.8  PHOS 4.1 4.4   Liver Function Tests: Recent Labs    11/26/20 1614 11/27/20 0523  AST 33 28  ALT 26 23  ALKPHOS 126 112  BILITOT 0.6 0.7  PROT 7.4 6.7  ALBUMIN 2.6* 2.4*   No results for input(s): LIPASE, AMYLASE in the last 72 hours. CBC: Recent Labs    11/27/20 0523 11/28/20 0534 11/29/20 0526  WBC 7.4 7.6 6.1  NEUTROABS 4.0 4.3  --   HGB 9.3* 8.7* 8.2*  HCT 28.7* 26.3* 25.3*  MCV 92.9 93.3 94.1  PLT 337 372 399   Cardiac Enzymes: Recent Labs    11/26/20 1614  CKTOTAL 21*   BNP: Invalid input(s): POCBNP D-Dimer: No results for input(s): DDIMER in the last 72 hours. Hemoglobin A1C: No results for input(s): HGBA1C in the last 72  hours. Fasting Lipid Panel: No results for input(s): CHOL, HDL, LDLCALC, TRIG, CHOLHDL, LDLDIRECT in the last 72 hours. Thyroid Function Tests: No results for input(s): TSH, T4TOTAL, T3FREE, THYROIDAB in the last 72 hours.  Invalid input(s): FREET3 Anemia Panel: No results for input(s): VITAMINB12, FOLATE, FERRITIN, TIBC, IRON, RETICCTPCT in the last 72 hours.  ECHOCARDIOGRAM COMPLETE  Result Date: 11/27/2020    ECHOCARDIOGRAM REPORT   Patient Name:   Melanie Bowen Date of Exam: 11/27/2020 Medical Rec #:  [id number]        Height:       66.0 in Accession #:    [id number]       Weight:       202.8 lb Date of Birth:  02-01-1961        BSA:          2.012 m Patient Age:    60 years         BP:           107/54 mmHg Patient Gender: F                HR:  72 bpm. Exam Location:  [facility] Procedure: 2D Echo, Color Doppler and Cardiac Doppler Indications:     R78.81 Bacteremia  History:         Patient has no prior history of Echocardiogram examinations.                  COPD and HIV; Risk Factors:Hypertension and Current Smoker.  Sonographer:     Charmayne Sheer RDCS (AE) Referring Phys:  [id number] [place] Diagnosing Phys: Ida Rogue MD  Sonographer Comments: No subcostal window. IMPRESSIONS  1. Left ventricular ejection fraction, by estimation, is 60 to 65%. The left ventricle has normal function. The left ventricle has no regional wall motion abnormalities. There is mild left ventricular hypertrophy. Left ventricular diastolic parameters are consistent with Grade II diastolic dysfunction (pseudonormalization).  2. Right ventricular systolic function is normal. The right ventricular size is normal.  3. The mitral valve is normal in structure. Mild mitral valve regurgitation.  4. There is mild thickening of the aortic valve. Aortic valve regurgitation is mild to moderate. Mild to moderate aortic valve sclerosis/calcification is present, without any evidence of aortic stenosis. FINDINGS  Left Ventricle:  Left ventricular ejection fraction, by estimation, is 60 to 65%. The left ventricle has normal function. The left ventricle has no regional wall motion abnormalities. The left ventricular internal cavity size was normal in size. There is  mild left ventricular hypertrophy. Left ventricular diastolic parameters are consistent with Grade II diastolic dysfunction (pseudonormalization). Right Ventricle: The right ventricular size is normal. No increase in right ventricular wall thickness. Right ventricular systolic function is normal. Left Atrium: Left atrial size was normal in size. Right Atrium: Right atrial size was normal in size. Pericardium: There is no evidence of pericardial effusion. Mitral Valve: The mitral valve is normal in structure. Mild mitral valve regurgitation. No evidence of mitral valve stenosis. MV peak gradient, 6.0 mmHg. The mean mitral valve gradient is 3.0 mmHg. Tricuspid Valve: The tricuspid valve is normal in structure. Tricuspid valve regurgitation is not demonstrated. No evidence of tricuspid stenosis. Aortic Valve: The aortic valve is normal in structure. There is mild thickening of the aortic valve. Aortic valve regurgitation is mild to moderate. Aortic regurgitation PHT measures 287 msec. Mild to moderate aortic valve sclerosis/calcification is present, without any evidence of aortic stenosis. Aortic valve mean gradient measures 11.0 mmHg. Aortic valve peak gradient measures 19.2 mmHg. Aortic valve area, by VTI measures 2.17 cm. Pulmonic Valve: The pulmonic valve was normal in structure. Pulmonic valve regurgitation is not visualized. No evidence of pulmonic stenosis. Aorta: The aortic root is normal in size and structure. Venous: The inferior vena cava is normal in size with greater than 50% respiratory variability, suggesting right atrial pressure of 3 mmHg. IAS/Shunts: No atrial level shunt detected by color flow Doppler.  LEFT VENTRICLE PLAX 2D LVIDd:         5.20 cm  Diastology  LVIDs:         3.00 cm  LV e' medial:    8.49 cm/s LV PW:         1.00 cm  LV E/e' medial:  11.8 LV IVS:        0.80 cm  LV e' lateral:   12.70 cm/s LVOT diam:     2.10 cm  LV E/e' lateral: 7.9 LV SV:         103 LV SV Index:   51 LVOT Area:     3.46 cm  RIGHT VENTRICLE RV  Basal diam:  3.00 cm LEFT ATRIUM             Index       RIGHT ATRIUM           Index LA diam:        3.90 cm 1.94 cm/m  RA Area:     11.90 cm LA Vol (A2C):   61.4 ml 30.52 ml/m RA Volume:   24.90 ml  12.38 ml/m LA Vol (A4C):   71.7 ml 35.64 ml/m LA Biplane Vol: 70.6 ml 35.09 ml/m  AORTIC VALVE                    PULMONIC VALVE AV Area (Vmax):    2.17 cm     PV Vmax:       1.13 m/s AV Area (Vmean):   2.32 cm     PV Vmean:      72.900 cm/s AV Area (VTI):     2.17 cm     PV VTI:        0.221 m AV Vmax:           219.00 cm/s  PV Peak grad:  5.1 mmHg AV Vmean:          157.000 cm/s PV Mean grad:  3.0 mmHg AV VTI:            0.472 m AV Peak Grad:      19.2 mmHg AV Mean Grad:      11.0 mmHg LVOT Vmax:         137.00 cm/s LVOT Vmean:        105.000 cm/s LVOT VTI:          0.296 m LVOT/AV VTI ratio: 0.63 AI PHT:            287 msec  AORTA Ao Root diam: 3.30 cm MITRAL VALVE MV Area (PHT): 3.27 cm     SHUNTS MV Area VTI:   3.97 cm     Systemic VTI:  0.30 m MV Peak grad:  6.0 mmHg     Systemic Diam: 2.10 cm MV Mean grad:  3.0 mmHg MV Vmax:       1.22 m/s MV Vmean:      85.3 cm/s MV Decel Time: 232 msec MV E velocity: 100.00 cm/s MV A velocity: 74.10 cm/s MV E/A ratio:  1.35 Ida Rogue MD Electronically signed by Ida Rogue MD Signature Date/Time: 11/27/2020/3:19:39 PM    Final      Echo LVEF 60 to 65%, mild to moderate aortic insufficiency, no evidence for vegetation  TELEMETRY:   ASSESSMENT AND PLAN:  Active Problems:   Bacteremia   VRE (vancomycin resistant enterococcus) culture positive    1. Vancomycin-resistant Enterococcus faecalis bacteremia, without evidence for endocarditis on surface 2D echocardiogram 2.   Essential hypertension, blood pressure in normal range without BP medications 3.  COPD, stable, oxygen saturation 98% on room air  Recommendations  1.  Agree with current therapy 2.  Proceed with TEE 12/01/2020.  The risk, benefits and alternatives of TEE were explained to the patient and informed written consent was obtained.   Isaias Cowman, MD, PhD, [facility] 11/29/2020 9:22 AM

## 2020-11-29 NOTE — Progress Notes (Signed)
Brief Pharmacy Note  Consult received for TPN and electrolyte management. Patient's PICC line has been removed for bacteremia. Currently without access for TPN.   Will not be able to start TPN until negative blood culture and new central access can be established.   Will recommend D5/LR for now and follow patient electrolytes daily as indicated.   Pharmacy Consult for Electrolyte Monitoring and Replacement   Recent Labs: Potassium (mmol/L)  Date Value  11/29/2020 3.8  12/21/2014 3.6   Magnesium (mg/dL)  Date Value  11/29/2020 1.8   Calcium (mg/dL)  Date Value  11/29/2020 8.5 (L)   Calcium, Total (mg/dL)  Date Value  12/21/2014 8.4 (L)   Albumin (g/dL)  Date Value  11/27/2020 2.4 (L)  12/14/2014 3.7   Phosphorus (mg/dL)  Date Value  11/29/2020 4.4   Sodium (mmol/L)  Date Value  11/29/2020 137  11/03/2017 138  12/21/2014 139     Plan:  No electrolyte replenishment warranted at this time - will follow   Lu Duffel, PharmD, BCPS Clinical Pharmacist 11/29/2020 7:27 AM

## 2020-11-29 NOTE — Progress Notes (Signed)
[facility] Progress Note  Patient: Melanie Bowen    [id number]  DOA: 11/26/2020     Date of Service: the patient was seen and examined on 11/29/2020  Chief Complaint  Patient presents with  . abnormal labs   Brief hospital course: Melanie Bowen is a 60 y.o. 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  Patient presented initially the day prior to this admission due to complaints of chills for the past 2 days.  Patient had blood cultures drawn in the ED that resulted today 1 out of 2 peripheral blood cultures positive for vancomycin-resistant Enterococcus faecalis.  Patient has a PICC line which she uses for her TPN.  EDP contacted infectious disease who recommended starting IV daptomycin.  Blood cultures were repeated in the ED today.  EDP requested admission for VRE bacteremia.  ED Course:  Temperature 98.8, BP 122/66, pulse 74, O2 saturation 96% on room air.  Lab studies remarkable for creatinine 1.3 with baseline of 0.9, procalcitonin 13, WBC 8.4K from 12.3K yesterday, hemoglobin 9.8K, platelet count 322K.  Assessment and Plan: Present on Admission: . Bacteremia  Active Problems:   Bacteremia  Vancomycin resistant Enterococcus faecalis bacteremia, POA Blood cultures drawn peripherally on 11/25/2020 revealed vancomycin resistant Enterococcus faecalis 1 out of 2 bottles, patient called at home to return to the ED. 3/30 Repeated blood cultures x2 growing Enterococcus faecalis  Unclear source of bacteremia History of EC fistula ID consulted, s/p IV daptomycin, started  ampicillin 2 g IV every 4 hourly as per ID Procalcitonin elevated 13, repeat procalcitonin level9.0  PICC line was removed, replace PICC line once repeat blood cultures are negative. F/u ID for further recommendation TTE reviewed, LVEF [id number], grade 2 diastolic dysfunction, no endocarditis mentioned. 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  As per general surgery patient does not need TPN. She reports she is able to eat some food CT a/p negative for any acute abdominal pathology, Mild bilateral lower lobe bronchial wall thickening with scattered tree in bud airspace disease consistent with bronchitis and bronchopneumonia. Aspiration could give a similar appearance.  Clinically no signs of pneumonia, Continue above antibiotic  AKI Baseline creatinine appears to be 0.9 with GFR greater than 60 Presented with creatinine of 1.3--1.12 gradually improved Start gentle IV fluid hydration Avoid nephrotoxic agents and dehydration Monitor urine output Repeat renal panel monitor with  HIV on HAART Management per infectious disease Resume home HAART  Chronic anxiety/depression/GERD/COPD not on oxygen supplementation Resume home regimen  History of PE Off oral anticoagulation Monitor Currently O2 saturation 100% on room air.   Body mass index is 32.74 kg/m.  Interventions:        Diet: Heart healthy diet DVT Prophylaxis: Subcutaneous Lovenox   Advance goals of care discussion: Full code  Family Communication: family was NOT present at bedside, at the time of interview.  The pt provided permission  to discuss medical plan with the family. Opportunity was given to ask question and all  questions were answered satisfactorily.   Disposition:  Pt is from Home, admitted with bacteremia, repeat blood cultures are pending, which precludes a safe discharge. Discharge to home, when cleared by ID.   Consults : Infectious disease, general surgery.  Secure chat to request consult to Dr. Lutricia Feil general surgery and Dr. Steva Ready infectious disease was sent by admitting physician.    Subjective: No overnight issues, pain is under control, patient is aware that she is going for TEE on Monday and she would like to be discharged after the procedure.  We will follow ID if patient needs reinsertion of PICC line on Monday before discharge.    Physical Exam: General:  alert oriented to time, place, and person.  Appear in mild distress, affect appropriate Eyes: PERRLA ENT: Oral Mucosa Clear, moist  Neck: no JVD,  Cardiovascular: S1 and S2 Present, no Murmur,  Respiratory: good respiratory effort, Bilateral Air entry equal and Decreased, no Crackles, no wheezes Abdomen: Bowel Sound present, Soft and no tenderness, ileostomy bag attached, slight amount of liquidy stool present Skin: no rashes Extremities: no Pedal edema, no calf tenderness Neurologic: without any new focal findings Gait not checked due to patient safety concerns  Vitals:   11/28/20 2107 11/28/20 2108 11/29/20 0447 11/29/20 0747  BP: (!) 107/47 (!) 109/58 (!) 95/52 (!) 110/57  Pulse: 75 77 64 62  Resp:   17 18  Temp:   98.2 F (36.8 C) 98.3 F (36.8 C)  TempSrc:   Oral   SpO2:   98% 98%  Weight:      Height:        Intake/Output Summary (Last 24 hours) at 11/29/2020 1147 Last data filed at 11/29/2020 0600 Gross per 24 hour  Intake 2072.81 ml  Output 1150 ml  Net 922.81 ml   Filed Weights   11/26/20 1457  Weight: 92 kg    Data Reviewed: I have personally reviewed and interpreted daily labs, tele strips, imagings as discussed above. I reviewed all nursing notes, pharmacy notes, vitals, pertinent old  records I have discussed plan of care as described above with RN and patient/family.  CBC: Recent Labs  Lab 11/25/20 1922 11/26/20 1614 11/27/20 0523 11/28/20 0534 11/29/20 0526  WBC 12.3* 8.4 7.4 7.6 6.1  NEUTROABS 9.8* 5.0 4.0 4.3  --   HGB 9.8* 9.2* 9.3* 8.7* 8.2*  HCT 28.3* 28.6* 28.7* 26.3* 25.3*  MCV 89.6 94.4 92.9 93.3 94.1  PLT 332 322 337 372 [id number]   Basic Metabolic Panel: Recent Labs  Lab 11/25/20 1922 11/26/20 1614 11/27/20 0523 11/28/20 0534 11/29/20 0526  NA 133* 135 136 135 137  K 4.0 4.2 4.4 4.0 3.8  CL 107 109 110 109 109  CO2 20* 22 21* 19* 21*  GLUCOSE 107* 95 95 84 90  BUN 29* 24* '18 13 11  '$ CREATININE 1.34* 1.33* 1.22* 1.12* 1.08*  CALCIUM 8.3* 8.8* 8.7* 8.4* 8.5*  MG  --   --  1.8 1.8 1.8  PHOS  --   --  4.0 4.1 4.4    Studies: No results found.  Scheduled Meds: . atorvastatin  20 mg Oral Daily  . bictegravir-emtricitabine-tenofovir AF  1 tablet Oral Daily  . diclofenac Sodium  2 g Topical QID  . enoxaparin (LOVENOX) injection  40 mg Subcutaneous Q24H  . famotidine  20 mg Oral Daily  . lidocaine  1 patch Transdermal Q24H  .  melatonin  10 mg Oral QHS  . mometasone-formoterol  2 puff Inhalation BID  . multivitamin with minerals  1 tablet Oral Daily  . pantoprazole  40 mg Oral Daily  . polyethylene glycol  17 g Oral Daily   Continuous Infusions: . ampicillin (OMNIPEN) IV 2 g (11/29/20 0849)  . dextrose 5% lactated ringers 50 mL/hr at 11/27/20 1108   PRN Meds: acetaminophen, bisacodyl, gabapentin, ondansetron (ZOFRAN) IV, oxyCODONE  Time spent: 35 minutes  Author: Val Riles. MD [facility] 11/29/2020 11:47 AM  To reach On-call, see care teams to locate the attending and reach out to them via [email or web address]. If 7PM-7AM, please contact night-coverage If you still have difficulty reaching the attending provider, please page the [facility] (Director on Call) for [facility] on amion for assistance.

## 2020-11-30 LAB — CBC
HCT: 26.1 % — ABNORMAL LOW (ref 36.0–46.0)
Hemoglobin: 8.4 g/dL — ABNORMAL LOW (ref 12.0–15.0)
MCH: 30.9 pg (ref 26.0–34.0)
MCHC: 32.2 g/dL (ref 30.0–36.0)
MCV: 96 fL (ref 80.0–100.0)
Platelets: 403 10*3/uL — ABNORMAL HIGH (ref 150–400)
RBC: 2.72 MIL/uL — ABNORMAL LOW (ref 3.87–5.11)
RDW: 16.7 % — ABNORMAL HIGH (ref 11.5–15.5)
WBC: 4.3 10*3/uL (ref 4.0–10.5)
nRBC: 0 % (ref 0.0–0.2)

## 2020-11-30 LAB — T-HELPER CELLS CD4/CD8 %
% CD 4 Pos. Lymph.: 47.2 % (ref 30.8–58.5)
Absolute CD 4 Helper: 802 /uL (ref 359–1519)
Basophils Absolute: 0 10*3/uL (ref 0.0–0.2)
Basos: 0 %
CD3+CD4+ Cells/CD3+CD8+ Cells Bld: 1.29 (ref 0.92–3.72)
CD3+CD8+ Cells # Bld: 621 /uL (ref 109–897)
CD3+CD8+ Cells NFr Bld: 36.5 % — ABNORMAL HIGH (ref 12.0–35.5)
EOS (ABSOLUTE): 0.2 10*3/uL (ref 0.0–0.4)
Eos: 4 %
Hematocrit: 25.7 % — ABNORMAL LOW (ref 34.0–46.6)
Hemoglobin: 8.4 g/dL — ABNORMAL LOW (ref 11.1–15.9)
Immature Grans (Abs): 0 10*3/uL (ref 0.0–0.1)
Immature Granulocytes: 1 %
Lymphocytes Absolute: 1.7 10*3/uL (ref 0.7–3.1)
Lymphs: 31 %
MCH: 30.9 pg (ref 26.6–33.0)
MCHC: 32.7 g/dL (ref 31.5–35.7)
MCV: 95 fL (ref 79–97)
Monocytes Absolute: 0.6 10*3/uL (ref 0.1–0.9)
Monocytes: 11 %
Neutrophils Absolute: 3 10*3/uL (ref 1.4–7.0)
Neutrophils: 53 %
Platelets: 435 10*3/uL (ref 150–450)
RBC: 2.72 x10E6/uL — ABNORMAL LOW (ref 3.77–5.28)
RDW: 15.3 % (ref 11.7–15.4)
WBC: 5.6 10*3/uL (ref 3.4–10.8)

## 2020-11-30 LAB — BASIC METABOLIC PANEL
Anion gap: 8 (ref 5–15)
BUN: 10 mg/dL (ref 6–20)
CO2: 21 mmol/L — ABNORMAL LOW (ref 22–32)
Calcium: 8.3 mg/dL — ABNORMAL LOW (ref 8.9–10.3)
Chloride: 110 mmol/L (ref 98–111)
Creatinine, Ser: 1.2 mg/dL — ABNORMAL HIGH (ref 0.44–1.00)
GFR, Estimated: 52 mL/min — ABNORMAL LOW (ref >60)
Glucose, Bld: 90 mg/dL (ref 70–99)
Potassium: 3.7 mmol/L (ref 3.5–5.1)
Sodium: 139 mmol/L (ref 135–145)

## 2020-11-30 MED ORDER — HYDROCORTISONE 1 % EX CREA
TOPICAL_CREAM | Freq: Two times a day (BID) | CUTANEOUS | Status: DC | PRN
  Filled 2020-11-30 (×2): qty 28

## 2020-11-30 MED ORDER — POTASSIUM CHLORIDE 10 MEQ/100ML IV SOLN
10.0000 meq | INTRAVENOUS | Status: DC
  Administered 2020-11-30: 13:00:00 10 meq via INTRAVENOUS
  Filled 2020-11-30: qty 100

## 2020-11-30 NOTE — Progress Notes (Signed)
[facility] Progress Note  Patient: Melanie Bowen    [id number]  DOA: 11/26/2020     Date of Service: the patient was seen and examined on 11/30/2020  Chief Complaint  Patient presents with  . abnormal labs   Brief hospital course: Melanie Bowen is a 60 y.o. female with medical history significant for COPD not on oxygen supplementation at baseline, GERD, essential hypertension, HIV on HAART, CVA, PE now off anticoagulation, depression, seizure disorder, perforated diverticulitis and abscess status post Hartman's in November 2021, enterocutaneous fistula and, malnutrition with TPN dependence who presented to [facility] ED after receiving a call back from the emergency department due to a positive blood culture.  Patient presented initially the day prior to this admission due to complaints of chills for the past 2 days.  Patient had blood cultures drawn in the ED that resulted today 1 out of 2 peripheral blood cultures positive for vancomycin-resistant Enterococcus faecalis.  Patient has a PICC line which she uses for her TPN.  EDP contacted infectious disease who recommended starting IV daptomycin.  Blood cultures were repeated in the ED today.  EDP requested admission for VRE bacteremia.  ED Course:  Temperature 98.8, BP 122/66, pulse 74, O2 saturation 96% on room air.  Lab studies remarkable for creatinine 1.3 with baseline of 0.9, procalcitonin 13, WBC 8.4K from 12.3K yesterday, hemoglobin 9.8K, platelet count 322K.  Assessment and Plan: Present on Admission: . Bacteremia  Active Problems:   Bacteremia  Vancomycin resistant Enterococcus faecalis bacteremia, POA Blood cultures drawn peripherally on 11/25/2020 revealed vancomycin resistant Enterococcus faecalis 1 out of 2 bottles, patient called at home to return to the ED. 3/30 Repeated blood cultures x2 growing Enterococcus faecalis  Unclear source of bacteremia History of EC fistula ID consulted, s/p IV daptomycin, started  ampicillin 2 g IV every 4 hourly as per ID Procalcitonin elevated 13, repeat procalcitonin level9.0  PICC line was removed, replace PICC line once repeat blood cultures are negative. F/u ID for further recommendation TTE reviewed, LVEF [id number], grade 2 diastolic dysfunction, no endocarditis mentioned. ID recommended cardiac consult to rule out endocarditis.scheduled for TEE on Monday by cardiology  3/31 blood culture NGTD Keep n.p.o. after midnight   Recent history of EC fistula post Hartmann's procedure Patient has had a history of diverticulitis with perforation and abscess Colostomy bag in place, history of EC fistula S/p TPN, d/c'd PICC line due to bacteremia.  As per general surgery patient does not need TPN. She reports she is able to eat some food CT a/p negative for any acute abdominal pathology, Mild bilateral lower lobe bronchial wall thickening with scattered tree in bud airspace disease consistent with bronchitis and bronchopneumonia. Aspiration could give a similar appearance.  Clinically no signs of pneumonia, Continue above antibiotic  AKI Baseline creatinine appears to be 0.9 with GFR greater than 60 Presented with creatinine of 1.3--1.12 gradually improved S/p gentle IV fluid hydration Avoid nephrotoxic agents and dehydration Monitor urine output Repeat renal panel monitor with  HIV on HAART Management per infectious disease Resume home HAART  Chronic anxiety/depression/GERD/COPD not on oxygen supplementation Resume home regimen  History of PE Off oral anticoagulation Monitor Currently O2 saturation 100% on room air.   Body mass index is 32.74 kg/m.  Interventions:        Diet: Heart healthy diet DVT Prophylaxis: Subcutaneous Lovenox   Advance goals of care discussion: Full code  Family Communication: family was NOT present at bedside, at the time of interview.  The pt provided permission to discuss medical plan with the family. Opportunity was given  to ask question and all questions were answered satisfactorily.   Disposition:  Pt is from Home, admitted with bacteremia, repeat blood cultures are pending, which precludes a safe discharge. Discharge to home, when cleared by ID.   Consults : Infectious disease following, may need PICC if TEE positive for endo? General surgery rec no need of PICC line and no interventyion Cardio for TEE will be done in am   Subjective: No overnight issues, pain is under control, patient is aware that she is going for TEE on Monday and she would like to be discharged after the procedure.  We will follow ID if patient needs reinsertion of PICC line on Monday before discharge.    Physical Exam: General:  alert oriented to time, place, and person.  Appear in mild distress, affect appropriate Eyes: PERRLA ENT: Oral Mucosa Clear, moist  Neck: no JVD,  Cardiovascular: S1 and S2 Present, no Murmur,  Respiratory: good respiratory effort, Bilateral Air entry equal and Decreased, no Crackles, no wheezes Abdomen: Bowel Sound present, Soft and no tenderness, ileostomy bag attached, slight amount of liquidy stool present Skin: no rashes Extremities: no Pedal edema, no calf tenderness Neurologic: without any new focal findings Gait not checked due to patient safety concerns  Vitals:   11/30/20 0043 11/30/20 0428 11/30/20 0812 11/30/20 1206  BP: (!) 101/45 108/65 117/77 (!) 131/57  Pulse: (!) 56 (!) 58 (!) 55 60  Resp: '18 16 15 17  '$ Temp: 97.7 F (36.5 C) 98.1 F (36.7 C) 97.7 F (36.5 C) 98 F (36.7 C)  TempSrc: Oral Oral    SpO2: 99% 98% 99% 100%  Weight:      Height:        Intake/Output Summary (Last 24 hours) at 11/30/2020 1212 Last data filed at 11/30/2020 0425 Gross per 24 hour  Intake --  Output 750 ml  Net -750 ml   Filed Weights   11/26/20 1457  Weight: 92 kg    Data Reviewed: I have personally reviewed and interpreted daily labs, tele strips, imagings as discussed above. I reviewed  all nursing notes, pharmacy notes, vitals, pertinent old records I have discussed plan of care as described above with RN and patient/family.  CBC: Recent Labs  Lab 11/25/20 1922 11/26/20 1614 11/27/20 0523 11/28/20 0534 11/29/20 0526 11/30/20 0516  WBC 12.3* 8.4 7.4 7.6 6.1 4.3  NEUTROABS 9.8* 5.0 4.0 4.3  --   --   HGB 9.8* 9.2* 9.3* 8.7* 8.2* 8.4*  HCT 28.3* 28.6* 28.7* 26.3* 25.3* 26.1*  MCV 89.6 94.4 92.9 93.3 94.1 96.0  PLT 332 322 337 372 399 [id number]*   Basic Metabolic Panel: Recent Labs  Lab 11/26/20 1614 11/27/20 0523 11/28/20 0534 11/29/20 0526 11/30/20 0516  NA 135 136 135 137 139  K 4.2 4.4 4.0 3.8 3.7  CL 109 110 109 109 110  CO2 22 21* 19* 21* 21*  GLUCOSE 95 95 84 90 90  BUN 24* '18 13 11 10  '$ CREATININE 1.33* 1.22* 1.12* 1.08* 1.20*  CALCIUM 8.8* 8.7* 8.4* 8.5* 8.3*  MG  --  1.8 1.8 1.8  --   PHOS  --  4.0 4.1 4.4  --     Studies: No results found.  Scheduled Meds: . atorvastatin  20 mg Oral Daily  . bictegravir-emtricitabine-tenofovir AF  1 tablet Oral Daily  . diclofenac Sodium  2 g Topical QID  . enoxaparin (LOVENOX)  injection  40 mg Subcutaneous Q24H  . famotidine  20 mg Oral Daily  . lidocaine  1 patch Transdermal Q24H  . melatonin  10 mg Oral QHS  . mometasone-formoterol  2 puff Inhalation BID  . multivitamin with minerals  1 tablet Oral Daily  . pantoprazole  40 mg Oral Daily  . polyethylene glycol  17 g Oral Daily   Continuous Infusions: . ampicillin (OMNIPEN) IV 2 g (11/30/20 0801)  . dextrose 5% lactated ringers 50 mL/hr at 11/27/20 1108  . potassium chloride     PRN Meds: acetaminophen, bisacodyl, gabapentin, ondansetron (ZOFRAN) IV, oxyCODONE  Time spent: 35 minutes  Author: Val Riles. MD [facility] 11/30/2020 12:12 PM  To reach On-call, see care teams to locate the attending and reach out to them via [email or web address]. If 7PM-7AM, please contact night-coverage If you still have difficulty reaching the attending provider,  please page the [facility] (Director on Call) for [facility] on amion for assistance.

## 2020-11-30 NOTE — Progress Notes (Signed)
Brief Pharmacy Note (Electrolytes and pending TPN)  Consult received for TPN and electrolyte management. Patient's PICC line has been removed for bacteremia. Currently without access for TPN.   Will not be able to start TPN until negative blood culture and new central access can be established.   Will recommend D5/LR for now and follow patient electrolytes daily as indicated.    Recent Labs: Potassium (mmol/L)  Date Value  11/30/2020 3.7  12/21/2014 3.6   Magnesium (mg/dL)  Date Value  11/29/2020 1.8   Calcium (mg/dL)  Date Value  11/30/2020 8.3 (L)   Calcium, Total (mg/dL)  Date Value  12/21/2014 8.4 (L)   Albumin (g/dL)  Date Value  11/27/2020 2.4 (L)  12/14/2014 3.7   Phosphorus (mg/dL)  Date Value  11/29/2020 4.4   Sodium (mmol/L)  Date Value  11/30/2020 139  11/03/2017 138  12/21/2014 139     Plan:  K 3.7 - will replete with IV KCl 79mq x 2 No further electrolyte replenishment warranted at this time - will follow Will continue to follow daily for replacement of PICC and initiation of [place] PharmD, BCPS Clinical Pharmacist 11/30/2020 9:32 AM

## 2020-12-01 ENCOUNTER — Encounter
Admission: EM | Disposition: A | Discharge status: Home Health | Payer: Self-pay | Source: Home / Self Care | Attending: Student

## 2020-12-01 ENCOUNTER — Inpatient Hospital Stay
Admit: 2020-12-01 | Discharge: 2020-12-01 | Disposition: A | Discharge status: Home / Self Care | Payer: Medicare Other | Attending: Cardiology | Admitting: Cardiology

## 2020-12-01 ENCOUNTER — Encounter: Payer: Self-pay | Admitting: Internal Medicine

## 2020-12-01 ENCOUNTER — Inpatient Hospital Stay: Payer: Medicare Other | Admitting: Anesthesiology

## 2020-12-01 ENCOUNTER — Inpatient Hospital Stay: Payer: Self-pay

## 2020-12-01 DIAGNOSIS — B20 Human immunodeficiency virus [HIV] disease: Secondary | ICD-10-CM

## 2020-12-01 DIAGNOSIS — B9689 Other specified bacterial agents as the cause of diseases classified elsewhere: Secondary | ICD-10-CM | POA: Diagnosis not present

## 2020-12-01 DIAGNOSIS — B952 Enterococcus as the cause of diseases classified elsewhere: Secondary | ICD-10-CM

## 2020-12-01 DIAGNOSIS — R7881 Bacteremia: Secondary | ICD-10-CM | POA: Diagnosis not present

## 2020-12-01 HISTORY — PX: TEE WITHOUT CARDIOVERSION: SHX5443

## 2020-12-01 LAB — CBC
HCT: 26.6 % — ABNORMAL LOW (ref 36.0–46.0)
Hemoglobin: 8.6 g/dL — ABNORMAL LOW (ref 12.0–15.0)
MCH: 30.5 pg (ref 26.0–34.0)
MCHC: 32.3 g/dL (ref 30.0–36.0)
MCV: 94.3 fL (ref 80.0–100.0)
Platelets: 450 10*3/uL — ABNORMAL HIGH (ref 150–400)
RBC: 2.82 MIL/uL — ABNORMAL LOW (ref 3.87–5.11)
RDW: 16.3 % — ABNORMAL HIGH (ref 11.5–15.5)
WBC: 4.6 10*3/uL (ref 4.0–10.5)
nRBC: 0 % (ref 0.0–0.2)

## 2020-12-01 LAB — BASIC METABOLIC PANEL
Anion gap: 2 — ABNORMAL LOW (ref 5–15)
BUN: 8 mg/dL (ref 6–20)
CO2: 24 mmol/L (ref 22–32)
Calcium: 8.5 mg/dL — ABNORMAL LOW (ref 8.9–10.3)
Chloride: 112 mmol/L — ABNORMAL HIGH (ref 98–111)
Creatinine, Ser: 1.23 mg/dL — ABNORMAL HIGH (ref 0.44–1.00)
GFR, Estimated: 51 mL/min — ABNORMAL LOW (ref >60)
Glucose, Bld: 85 mg/dL (ref 70–99)
Potassium: 3.9 mmol/L (ref 3.5–5.1)
Sodium: 138 mmol/L (ref 135–145)

## 2020-12-01 LAB — MAGNESIUM: Magnesium: 1.7 mg/dL (ref 1.7–2.4)

## 2020-12-01 LAB — PHOSPHORUS: Phosphorus: 4.4 mg/dL (ref 2.5–4.6)

## 2020-12-01 SURGERY — ECHOCARDIOGRAM, TRANSESOPHAGEAL
Anesthesia: General

## 2020-12-01 MED ORDER — AMPICILLIN IV (FOR PTA / DISCHARGE USE ONLY): 12.0000 g | INTRAVENOUS | 0 refills | Status: AC

## 2020-12-01 MED ORDER — CEFTRIAXONE IV (FOR PTA / DISCHARGE USE ONLY): 2.0000 g | Freq: Two times a day (BID) | INTRAVENOUS | 0 refills | Status: AC

## 2020-12-01 MED ORDER — BUTAMBEN-TETRACAINE-BENZOCAINE 2-2-14 % EX AERO
INHALATION_SPRAY | CUTANEOUS | Status: AC
  Filled 2020-12-01: qty 5

## 2020-12-01 MED ORDER — SODIUM CHLORIDE 0.9% FLUSH: 10.0000 mL | INTRAVENOUS | Status: DC | PRN

## 2020-12-01 MED ORDER — SODIUM CHLORIDE 0.9 % IV SOLN
2.0000 g | Freq: Two times a day (BID) | INTRAVENOUS | Status: DC
  Administered 2020-12-01 – 2020-12-02 (×3): 2 g via INTRAVENOUS
  Filled 2020-12-01: qty 20
  Filled 2020-12-01: qty 2
  Filled 2020-12-01 (×3): qty 20

## 2020-12-01 MED ORDER — SODIUM CHLORIDE FLUSH 0.9 % IV SOLN
INTRAVENOUS | Status: AC
  Filled 2020-12-01: qty 10

## 2020-12-01 MED ORDER — PROPOFOL 10 MG/ML IV BOLUS
INTRAVENOUS | Status: DC | PRN
  Administered 2020-12-01 (×2): 10 mg via INTRAVENOUS
  Administered 2020-12-01: 20 mg via INTRAVENOUS
  Administered 2020-12-01 (×2): 10 mg via INTRAVENOUS
  Administered 2020-12-01: 30 mg via INTRAVENOUS
  Administered 2020-12-01: 60 mg via INTRAVENOUS

## 2020-12-01 MED ORDER — PROPOFOL 10 MG/ML IV BOLUS
INTRAVENOUS | Status: AC
  Filled 2020-12-01: qty 40

## 2020-12-01 MED ORDER — CHLORHEXIDINE GLUCONATE CLOTH 2 % EX PADS
6.0000 | MEDICATED_PAD | Freq: Every day | CUTANEOUS | Status: DC
  Administered 2020-12-01: 6 via TOPICAL

## 2020-12-01 MED ORDER — SODIUM CHLORIDE 0.9% FLUSH
10.0000 mL | Freq: Two times a day (BID) | INTRAVENOUS | Status: DC
  Administered 2020-12-01 – 2020-12-02 (×3): 10 mL

## 2020-12-01 MED ORDER — LIDOCAINE VISCOUS HCL 2 % MT SOLN
OROMUCOSAL | Status: AC
  Filled 2020-12-01: qty 15

## 2020-12-01 MED ORDER — SODIUM CHLORIDE 0.9 % IV SOLN: INTRAVENOUS | Status: DC

## 2020-12-01 NOTE — Care Management Important Message (Signed)
Important Message  Patient Details  Name: Melanie Bowen MRN: [id number] Date of Birth: 07/13/1961   Medicare Important Message Given:  Yes  Patient is in an isolation room so I reviewed the Important Message from Medicare with her by phone [phone]) and she stated she understood the contents of the form.  I asked if she would like a copy of the form and stated she already had a copy in her room and that it wouldn't be necessary.  I wished her well and thanked her for her time.  [name] A Deona Novitski 12/01/2020, 2:47 PM

## 2020-12-01 NOTE — Transfer of Care (Signed)
Immediate Anesthesia Transfer of Care Note  Patient: Melanie Bowen  Procedure(s) Performed: TRANSESOPHAGEAL ECHOCARDIOGRAM (TEE) (N/A )  Patient Location: PACU and Special Procedures RM 4  Anesthesia Type:General  Level of Consciousness: drowsy  Airway & Oxygen Therapy: Patient Spontanous Breathing and Patient connected to nasal cannula oxygen  Post-op Assessment: Report given to RN and Post -op Vital signs reviewed and stable  Post vital signs: Reviewed and stable  Last Vitals:  Vitals Value Taken Time  BP 132/56 12/01/20 0836  Temp    Pulse 58 12/01/20 0839  Resp 23 12/01/20 0839  SpO2 100 % 12/01/20 0839    Last Pain:  Vitals:   12/01/20 0758  TempSrc: Oral  PainSc: 0-No pain      Patients Stated Pain Goal: 0 ([id number] [id number])  Complications: No complications documented.

## 2020-12-01 NOTE — Plan of Care (Signed)

## 2020-12-01 NOTE — Anesthesia Procedure Notes (Signed)
Date/Time: 12/01/2020 8:20 AM Performed by: Doreen Salvage, CRNA Pre-anesthesia Checklist: Patient identified, Emergency Drugs available, Suction available and Patient being monitored Patient Re-evaluated:Patient Re-evaluated prior to induction Oxygen Delivery Method: Nasal cannula Induction Type: IV induction Dental Injury: Teeth and Oropharynx as per pre-operative assessment  Comments: Nasal cannula with etCO2 monitoring

## 2020-12-01 NOTE — TOC Progression Note (Signed)
Transition of Care [facility]) - Progression Note    Patient Details  Name: Melanie Bowen MRN: [id number] Date of Birth: Dec 06, 1960  Transition of Care [facility]) CM/SW [place], RN Phone Number: 12/01/2020, 3:40 PM  Clinical Narrative:   TOC contacted patient via phone due to isolation status.  RE: discharge planning.  Patient requested [facility] home health and infusion to care for her infusion and PICC line, states she has been a patient of theirs.  In speaking with [facility], they are unable to accept her at this time.  Pam at [facility] contacted, will meet patient at hospital to teach and set patient up with infusion tomorrow morning.  Care team and Rachel Bo [facility] pharmacist given updated information.  Patient was contacted, and accepted [facility].  Patient had no further questions, TOC contact information given, TOC to follow through discharge.    Expected Discharge Plan: [place] Barriers to Discharge: Continued Medical Work up  Expected Discharge Plan and Services Expected Discharge Plan: [place]   Discharge Planning Services: CM Consult,Mobile Meals   Living arrangements for the past 2 months: Single Family Home                 DME Arranged: N/A         HH Arranged: NA           Social Determinants of Health (SDOH) Interventions    Readmission Risk Interventions Readmission Risk Prevention Plan 05/23/2020 11/08/2019  Transportation Screening Complete Complete  Medication Review Press photographer) Complete Complete  PCP or Specialist appointment within 3-5 days of discharge (No Data) Not Complete  PCP/Specialist Appt Not Complete comments - Her PCP is out for 2-3 weeks so other two providers are booked out. The receptionist will call her if there is a cancellation.  [place] or Home Care Consult (No Data) Complete  SW Recovery Care/Counseling Consult Complete Complete  Palliative Care Screening Complete Not Applicable  Skilled Nursing  Facility Complete Not Applicable  Some recent data might be hidden

## 2020-12-01 NOTE — Anesthesia Preprocedure Evaluation (Signed)
Anesthesia Evaluation  Patient identified by MRN, date of birth, ID band Patient awake    Reviewed: Allergy & Precautions, H&P , NPO status , Patient's Chart, lab work & pertinent test results  History of Anesthesia Complications Negative for: history of anesthetic complications  Airway Mallampati: III  TM Distance: >3 FB Neck ROM: limited    Dental  (+) Chipped, Poor Dentition, Missing   Pulmonary shortness of breath and with exertion, asthma , pneumonia, COPD, former smoker,    + rhonchi  + decreased breath sounds      Cardiovascular hypertension, (-) anginaNormal cardiovascular exam     Neuro/Psych  Headaches, PSYCHIATRIC DISORDERS    GI/Hepatic negative GI ROS, Neg liver ROS, GERD  Medicated and Controlled,  Endo/Other  negative endocrine ROS  Renal/GU CRF and ARFRenal disease  negative genitourinary   Musculoskeletal  (+) Arthritis ,   Abdominal   Peds  Hematology negative hematology ROS (+)   Anesthesia Other Findings Patient is NPO appropriate and reports no nausea or vomiting today.  Past Medical History: 12/2015: Acute GI hemorrhage     Comment:  required transfusion No date: Allergic rhinitis No date: Arthritis     Comment:  DDD No date: Asthma No date: Blepharitis No date: Bronchitis No date: Cigarette smoker No date: Claustrophobia No date: COPD (chronic obstructive pulmonary disease) (HCC) No date: Cough 04/10/2014: DDD (degenerative disc disease), lumbar No date: Depression No date: Diverticulosis No date: Ectopic pregnancy No date: Eye irritation No date: Genital herpes No date: GERD (gastroesophageal reflux disease) No date: Headache No date: HIV (human immunodeficiency virus infection) (HCC) No date: Hypertension No date: Paresthesia of hand, bilateral No date: Shortness of breath dyspnea     Comment:  at times due to asthma  Past Surgical History: 12/2014: BACK SURGERY      Comment:  lumbar lam. Dr. Deri Fuelling 12/25/2012: BACK SURGERY     Comment:  Removal lumbosubarachnoid shunt system, L5-S1 TF ESI,               Dr. Virl Axe Minchew No date: back surgery may 2016     Comment:  lumbar  No date: BUNIONECTOMY; Bilateral     Comment:  feet 12/06/2014: COLONOSCOPY     Comment:  rescheduled from 11/01/2014 due to poor prep No date: DIAGNOSTIC LAPAROSCOPY 04/25/2020: DIALYSIS/PERMA CATHETER INSERTION; N/A     Comment:  Procedure: DIALYSIS/PERMA CATHETER INSERTION;  Surgeon:               Algernon Huxley, MD;  Location: [place] CV LAB;                Service: Cardiovascular;  Laterality: N/A; 05/23/2020: DIALYSIS/PERMA CATHETER REMOVAL; N/A     Comment:  Procedure: DIALYSIS/PERMA CATHETER REMOVAL;  Surgeon:               Algernon Huxley, MD;  Location: [place] CV LAB;                Service: Cardiovascular;  Laterality: N/A; No date: DISTAL FEMUR OSTEOTOMY; Right     Comment:  Dr. Earnestine Leys 01/11/2016: ESOPHAGOGASTRODUODENOSCOPY; Left     Comment:  Procedure: ESOPHAGOGASTRODUODENOSCOPY (EGD);  Surgeon:               Hulen Luster, MD;  Location: [facility] ENDOSCOPY;  Service:               Endoscopy;  Laterality: Left; No date: Finger fracture; Right  Comment:  5th finger 01/25/2014: FRACTURE SURGERY; Right     Comment:  closed reduction & percutaneous pinning of 5th digit No date: HAND SURGERY; Bilateral     Comment:  carpal tunnel No date: JOINT REPLACEMENT; Bilateral     Comment:  knees 2003,2007: KNEE SURGERY; Bilateral     Comment:  total Joint No date: LAPAROSCOPY FOR ECTOPIC PREGNANCY 01/12/2016: PERIPHERAL VASCULAR CATHETERIZATION; N/A     Comment:  Procedure: Visceral Angiography;  Surgeon: Algernon Huxley,               MD;  Location: [place] CV LAB;  Service:               Cardiovascular;  Laterality: N/A; 01/12/2016: PERIPHERAL VASCULAR CATHETERIZATION; N/A     Comment:  Procedure: Visceral Artery Intervention;  Surgeon: Algernon Huxley, MD;  Location: [place] CV LAB;  Service:               Cardiovascular;  Laterality: N/A; 12/31/2002: REVISION TOTAL KNEE ARTHROPLASTY; Right     Comment:  Dr. Marry Guan No date: TUBAL LIGATION 06/30/2015: VIDEO BRONCHOSCOPY; N/A     Comment:  Procedure: VIDEO BRONCHOSCOPY WITHOUT FLUORO;  Surgeon:               Vilinda Boehringer, MD;  Location: [facility] ORS;  Service:               Cardiopulmonary;  Laterality: N/A;  BMI    Body Mass Index: 32.74 kg/m      Reproductive/Obstetrics negative OB ROS                             Anesthesia Physical Anesthesia Plan  ASA: IV  Anesthesia Plan: General   Post-op Pain Management:    Induction: Intravenous  PONV Risk Score and Plan: Propofol infusion and TIVA  Airway Management Planned: Natural Airway and Nasal Cannula  Additional Equipment:   Intra-op Plan:   Post-operative Plan:   Informed Consent: I have reviewed the patients History and Physical, chart, labs and discussed the procedure including the risks, benefits and alternatives for the proposed anesthesia with the patient or authorized representative who has indicated his/her understanding and acceptance.     Dental Advisory Given  Plan Discussed with: Anesthesiologist, CRNA and Surgeon  Anesthesia Plan Comments: (Patient consented for risks of anesthesia including but not limited to:  - adverse reactions to medications - risk of airway placement if required - damage to eyes, teeth, lips or other oral mucosa - nerve damage due to positioning  - sore throat or hoarseness - Damage to heart, brain, nerves, lungs, other parts of body or loss of life  Patient voiced understanding.)        Anesthesia Quick Evaluation

## 2020-12-01 NOTE — Progress Notes (Signed)
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 To discharging provider:  please sign these orders via discharge navigator,  Select New Orders & click on the button choice - Manage This Unsigned Work.     Thank you for allowing pharmacy to be a part of this patient's care.  Doreene Eland, PharmD, BCPS.   Work Cell: [phone] 12/01/2020 11:35 AM

## 2020-12-01 NOTE — Treatment Plan (Signed)
Diagnosis: Enterococcus ( VRE- sensitive to ampicillin)  bacteremia and aortic valve endocarditis Baseline Creatinine 1.26    Allergies  Allergen Reactions  . Acetaminophen Nausea And Vomiting  . Ibuprofen Other (See Comments)    Reports causes bleeding  . Gabapentin Rash  . Morphine Itching and Rash  . Morphine And Related Itching  . Zanaflex  [Tizanidine] Rash    OPAT Orders Discharge antibiotics: Ampicillin 2 grams IV every 4 hours can be given as a continuous infusion of 12 grams/24 hours Ceftriaxone 2 grams IV every 12 hrs Duration: 6 weeks  End Date: 01/07/21  [facility] Care Per Protocol:  Labs weekly while on IV antibiotics: _X_ CBC with differential  _X_ CMP   _X_ Please pull PIC at completion of IV antibiotics _  Fax weekly labs to [phone]  Clinic Follow Up Appt: December 25, 2020   Call [phone]  with any critical value or other questions Dr. Adrian Prows will be covering Dr.Bonny Egger  12/02/20-12/21/20

## 2020-12-01 NOTE — Anesthesia Postprocedure Evaluation (Signed)
Anesthesia Post Note  Patient: Melanie Bowen  Procedure(s) Performed: TRANSESOPHAGEAL ECHOCARDIOGRAM (TEE) (N/A )  Patient location during evaluation: Specials Recovery Anesthesia Type: General Level of consciousness: awake and alert Pain management: pain level controlled Vital Signs Assessment: post-procedure vital signs reviewed and stable Respiratory status: spontaneous breathing, nonlabored ventilation, respiratory function stable and patient connected to nasal cannula oxygen Cardiovascular status: blood pressure returned to baseline and stable Postop Assessment: no apparent nausea or vomiting Anesthetic complications: no   No complications documented.   Last Vitals:  Vitals:   12/01/20 0915 12/01/20 0933  BP: 122/61 (!) 133/54  Pulse: 60 (!) 59  Resp: 18 18  Temp:  36.7 C  SpO2: 99% 98%    Last Pain:  Vitals:   12/01/20 0915  TempSrc:   PainSc: 0-No pain                 Precious Haws Claretta Kendra

## 2020-12-01 NOTE — Progress Notes (Signed)
[facility] Progress Note  Patient: Melanie Bowen    [id number]  DOA: 11/26/2020     Date of Service: the patient was seen and examined on 12/01/2020  Chief Complaint  Patient presents with  . abnormal labs   Brief hospital course: Melanie Bowen is a 60 y.o. female with medical history significant for COPD not on oxygen supplementation at baseline, GERD, essential hypertension, HIV on HAART, CVA, PE now off anticoagulation, depression, seizure disorder, perforated diverticulitis and abscess status post Hartman's in November 2021, enterocutaneous fistula and, malnutrition with TPN dependence who presented to [facility] ED after receiving a call back from the emergency department due to a positive blood culture.  Patient presented initially the day prior to this admission due to complaints of chills for the past 2 days.  Patient had blood cultures drawn in the ED that resulted today 1 out of 2 peripheral blood cultures positive for vancomycin-resistant Enterococcus faecalis.  Patient has a PICC line which she uses for her TPN.  EDP contacted infectious disease who recommended starting IV daptomycin.  Blood cultures were repeated in the ED today.  EDP requested admission for VRE bacteremia.  ED Course:  Temperature 98.8, BP 122/66, pulse 74, O2 saturation 96% on room air.  Lab studies remarkable for creatinine 1.3 with baseline of 0.9, procalcitonin 13, WBC 8.4K from 12.3K yesterday, hemoglobin 9.8K, platelet count 322K.  Assessment and Plan: Present on Admission: . Bacteremia  Active Problems:   Bacteremia  Vancomycin resistant Enterococcus faecalis bacteremia, POA Blood cultures drawn peripherally on 11/25/2020 revealed vancomycin resistant Enterococcus faecalis 1 out of 2 bottles, patient called at home to return to the ED. 3/30 Repeated blood cultures x2 growing Enterococcus faecalis. Repeat cultures 3/31 ngtd. Most likely 2/2 picc which has been removed. S/p daptomycin, now on  ampicillin per ID. TEE 4/4 no clear sign vegetations, possibly small one.  - replace picc today in anticipation of at least 2 more weeks abx - f/u ID recs  Recent history of EC fistula post Hartmann's procedure Patient has had a history of diverticulitis with perforation and abscess Colostomy bag in place, history of EC fistula S/p TPN, d/c'd PICC line due to bacteremia.  As per general surgery patient does not need TPN. She reports she is able to eat some food. Pt reports no fistulata output today, denies abd pain - monitor fistula for output, gen surg consult if no output by domorrow  AKI Baseline creatinine appears to be 0.9 with GFR greater than 60 Presented with creatinine of 1.3--1.12 gradually improved S/p gentle IV fluid hydration Avoid nephrotoxic agents and dehydration Monitor urine output Repeat renal panel monitor with  HIV on HAART Management per infectious disease Resumed home HAART  Chronic anxiety/depression/GERD/COPD not on oxygen supplementation Resumed home regimen  History of PE Off oral anticoagulation Monitor Currently O2 saturation 100% on room air.   Body mass index is 32.74 kg/m.  Interventions:        Diet: Heart healthy diet DVT Prophylaxis: Subcutaneous Lovenox   Advance goals of care discussion: Full code  Family Communication: family was NOT present at bedside, at the time of interview.    Disposition:  Home w/ IV abx pending ID determination of duration and regimen   Consults :  ID, gen surg for TEE  Subjective:  No acute events, no pain, no output from ostomy, no fevers, tolerating diet, no chest pain, no vomiting.    Physical Exam: General:  alert oriented to time, place, and  person.  Appear in mild distress, affect appropriate Eyes: PERRLA ENT: Oral Mucosa Clear, moist  Neck: no JVD,  Cardiovascular: S1 and S2 Present, no Murmur,  Respiratory: good respiratory effort, Bilateral Air entry equal and Decreased, no  Crackles, no wheezes Abdomen: Bowel Sound present, Soft and no tenderness, ileostomy bag attached, no output Skin: no rashes Extremities: no Pedal edema, no calf tenderness Neurologic: ,moving all 4 extremities equally  Vitals:   12/01/20 0845 12/01/20 0900 12/01/20 0915 12/01/20 0933  BP: (!) 117/56 132/71 122/61 (!) 133/54  Pulse: 60 (!) 59 60 (!) 59  Resp: '18 17 18 18  '$ Temp:    98.1 F (36.7 C)  TempSrc:      SpO2: 99% 98% 99% 98%  Weight:      Height:        Intake/Output Summary (Last 24 hours) at 12/01/2020 1028 Last data filed at 12/01/2020 0831 Gross per 24 hour  Intake 700 ml  Output --  Net 700 ml   Filed Weights   11/26/20 1457  Weight: 92 kg    Data Reviewed: I have personally reviewed and interpreted daily labs, tele strips, imagings as discussed above. I reviewed all nursing notes, pharmacy notes, vitals, pertinent old records I have discussed plan of care as described above with RN and patient/family.  CBC: Recent Labs  Lab 11/25/20 1922 11/26/20 1614 11/27/20 0523 11/28/20 0534 11/29/20 0526 11/30/20 0516 12/01/20 0649  WBC 12.3* 8.4 7.4 7.6 5.6  6.1 4.3 4.6  NEUTROABS 9.8* 5.0 4.0 4.3 3.0  --   --   HGB 9.8* 9.2* 9.3* 8.7* 8.2*  8.4* 8.4* 8.6*  HCT 28.3* 28.6* 28.7* 26.3* 25.3*  25.7* 26.1* 26.6*  MCV 89.6 94.4 92.9 93.3 94.1  95 96.0 94.3  PLT 332 322 337 372 399  435 403* [id number]*   Basic Metabolic Panel: Recent Labs  Lab 11/27/20 0523 11/28/20 0534 11/29/20 0526 11/30/20 0516 12/01/20 0649  NA 136 135 137 139 138  K 4.4 4.0 3.8 3.7 3.9  CL 110 109 109 110 112*  CO2 21* 19* 21* 21* 24  GLUCOSE 95 84 90 90 85  BUN '18 13 11 10 8  '$ CREATININE 1.22* 1.12* 1.08* 1.20* 1.23*  CALCIUM 8.7* 8.4* 8.5* 8.3* 8.5*  MG 1.8 1.8 1.8  --  1.7  PHOS 4.0 4.1 4.4  --  4.4    Studies: No results found.  Scheduled Meds: . atorvastatin  20 mg Oral Daily  . bictegravir-emtricitabine-tenofovir AF  1 tablet Oral Daily  . diclofenac Sodium  2 g  Topical QID  . enoxaparin (LOVENOX) injection  40 mg Subcutaneous Q24H  . famotidine  20 mg Oral Daily  . lidocaine  1 patch Transdermal Q24H  . melatonin  10 mg Oral QHS  . mometasone-formoterol  2 puff Inhalation BID  . multivitamin with minerals  1 tablet Oral Daily  . pantoprazole  40 mg Oral Daily  . polyethylene glycol  17 g Oral Daily  . sodium chloride flush       Continuous Infusions: . ampicillin (OMNIPEN) IV 2 g (12/01/20 0436)  . dextrose 5% lactated ringers 50 mL/hr at 11/30/20 1700   PRN Meds: acetaminophen, bisacodyl, gabapentin, hydrocortisone cream, ondansetron (ZOFRAN) IV, oxyCODONE  Time spent: 40 minutes  Author: Laurey Arrow, MD [facility] 12/01/2020 10:28 AM  To reach On-call, see care teams to locate the attending and reach out to them via [email or web address]. If 7PM-7AM, please contact night-coverage If you still have difficulty  reaching the attending provider, please page the [facility] (Director on Call) for [facility] on amion for assistance.

## 2020-12-01 NOTE — Progress Notes (Signed)
Date of Admission:  11/26/2020      ID: Melanie Bowen is a 60 y.o. female  Active Problems:   Bacteremia   VRE (vancomycin resistant enterococcus) culture positive    Subjective: Doing well No specific complaints  Medications:  . atorvastatin  20 mg Oral Daily  . bictegravir-emtricitabine-tenofovir AF  1 tablet Oral Daily  . diclofenac Sodium  2 g Topical QID  . enoxaparin (LOVENOX) injection  40 mg Subcutaneous Q24H  . famotidine  20 mg Oral Daily  . lidocaine  1 patch Transdermal Q24H  . melatonin  10 mg Oral QHS  . mometasone-formoterol  2 puff Inhalation BID  . multivitamin with minerals  1 tablet Oral Daily  . pantoprazole  40 mg Oral Daily  . polyethylene glycol  17 g Oral Daily    Objective: Vital signs in last 24 hours: Temp:  [97.7 F (36.5 C)-98.8 F (37.1 C)] 97.7 F (36.5 C) (04/04 1100) Pulse Rate:  [56-69] 57 (04/04 1100) Resp:  [14-31] 14 (04/04 1100) BP: (117-144)/(49-71) 135/52 (04/04 1100) SpO2:  [97 %-100 %] 100 % (04/04 1100)  PHYSICAL EXAM:  General: Alert, cooperative, no distress,  Lungs: Clear to auscultation bilaterally. No Wheezing or Rhonchi. No rales. Heart: Regular rate and rhythm, no murmur, rub or gallop. Abdomen: Soft, colostomy Extremities: atraumatic, no cyanosis. No edema. No clubbing Skin: No rashes or lesions. Or bruising Lymph: Cervical, supraclavicular normal. Neurologic: Grossly non-focal  Lab Results Recent Labs    11/30/20 0516 12/01/20 0649  WBC 4.3 4.6  HGB 8.4* 8.6*  HCT 26.1* 26.6*  NA 139 138  K 3.7 3.9  CL 110 112*  CO2 21* 24  BUN 10 8  CREATININE 1.20* 1.23*   Liver Panel No results for input(s): PROT, ALBUMIN, AST, ALT, ALKPHOS, BILITOT, BILIDIR, IBILI in the last 72 hours. Sedimentation Rate No results for input(s): ESRSEDRATE in the last 72 hours. C-Reactive Protein No results for input(s): CRP in the last 72 hours.  Microbiology: VRE 3/29 and 3/20 [facility] 3/31 BC NG Studies/Results: ECHO  TEE  Result Date: 12/01/2020    TRANSESOPHOGEAL ECHO REPORT   Patient Name:   Melanie Bowen Date of Exam: 12/01/2020 Medical Rec #:  [id number]        Height:       66.0 in Accession #:    [id number]       Weight:       202.8 lb Date of Birth:  Nov 16, 1960        BSA:          2.012 m Patient Age:    79 years         BP:           133/55 mmHg Patient Gender: F                HR:           62 bpm. Exam Location:  [facility] Procedure: Transesophageal Echo and Color Doppler Indications:     R78.81 Bacteremia  History:         Patient has prior history of Echocardiogram examinations, most                  recent 11/27/2020. HIV; Risk Factors:Current Smoker and                  Hypertension.  Sonographer:     Charmayne Sheer RDCS (AE) Referring Phys:  [place] Diagnosing Phys: Chrissie Noa  Fath MD PROCEDURE: TEE procedure time was 15 minutes. The transesophogeal probe was passed without difficulty through the esophogus of the patient. Imaged were obtained with the patient in a left lateral decubitus position. Sedation performed by different physician. The patient was monitored while under deep sedation. Anesthestetic sedation was provided intravenously by Anesthesiology: '60mg'$  of Propofol. Image quality was excellent. The patient's vital signs; including heart rate, blood pressure, and oxygen saturation; remained stable throughout the procedure. The patient developed no complications during the procedure. IMPRESSIONS  1. Left ventricular ejection fraction, by estimation, is 55 to 60%. The left ventricle has normal function. The left ventricle has no regional wall motion abnormalities.  2. Right ventricular systolic function is normal. The right ventricular size is normal.  3. No left atrial/left atrial appendage thrombus was detected.  4. The mitral valve is grossly normal. Trivial mitral valve regurgitation.  5. Aortic valve vegetation is visualized on the noncoronary.  6. The aortic valve is calcified. Aortic valve  regurgitation is moderate. FINDINGS  Left Ventricle: Left ventricular ejection fraction, by estimation, is 55 to 60%. The left ventricle has normal function. The left ventricle has no regional wall motion abnormalities. The left ventricular internal cavity size was normal in size. Right Ventricle: The right ventricular size is normal. No increase in right ventricular wall thickness. Right ventricular systolic function is normal. Left Atrium: Left atrial size was normal in size. No left atrial/left atrial appendage thrombus was detected. Right Atrium: Right atrial size was normal in size. Pericardium: There is no evidence of pericardial effusion. Mitral Valve: The mitral valve is grossly normal. Trivial mitral valve regurgitation. There is no evidence of mitral valve vegetation. Tricuspid Valve: The tricuspid valve is grossly normal. Tricuspid valve regurgitation is trivial. Aortic Valve: The aortic valve is calcified. Aortic valve regurgitation is moderate. A non-mobile vegetation is seen on the noncoronary. Pulmonic Valve: The pulmonic valve was not well visualized. Pulmonic valve regurgitation is trivial. Aorta: The aortic root is normal in size and structure. IAS/Shunts: No atrial level shunt detected by color flow Doppler. Bartholome Bill MD Electronically signed by Bartholome Bill MD Signature Date/Time: 12/01/2020/12:23:50 PM    Final    [place] EKG SITE RITE  Result Date: 12/01/2020 If Site Rite image not attached, placement could not be confirmed due to current cardiac rhythm.    Assessment/Plan: 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  She is currently on ampicillin.  We will add ceftriaxone as TEE show shows possible small aortic valve vegetation. She will need 6 weeks of IV antibiotics until 01/07/21 Repeat blood culture from 11/27/2020 was negative.  She will get a PICC line with double-lumen today.  HIV on Biktarvy.  Well-controlled.  Viral load is undetectable from 11/28/2020.  CD4 is 123XX123 (  [id number])  Complicated intra-abdominal pathology. Perforated diverticulitis with intra-abdominal abscess, septic shock, in August September 2021 and had drain placement.  Then in November 2021 she underwent Hartman's procedure with end colostomy at [facility].  This was complicated by enterocutaneous fistula.  She was on TPN and was also eating.  EC fistula closed.  She last saw her surgeon on November 11, 2020 and there is a plan to reverse the colostomy on December 19, 2020.   Anemia of chronic disease  AKI on CKD. ID will sign off- call if needed   OPAT Orders Discharge antibiotics: Ampicillin 2 grams IV every 4 hours can be given as a continuous infusion of 12 grams/24 hours Ceftriaxone 2 grams IV every 12  hrs Duration: 6 weeks  End Date: 01/07/21  [facility] - Inpatient Care Per Protocol:  Labs weekly Monday while on IV antibiotics: _X_ CBC with differential  _X_ CMP   _X_ Please pull PIC at completion of IV antibiotics _  Fax weekly labs to [phone] promptly  Clinic Follow Up Appt: December 25, 2020   Call [phone]  with any critical value or other questions Dr. Adrian Prows will be covering Dr.Tandy Lewin  12/02/20-12/21/20

## 2020-12-01 NOTE — Progress Notes (Signed)
*  PRELIMINARY RESULTS* Echocardiogram Echocardiogram Transesophageal has been performed.  Melanie Bowen 12/01/2020, 8:41 AM

## 2020-12-01 NOTE — CV Procedure (Signed)
   TRANSESOPHAGEAL ECHOCARDIOGRAM   NAME:  Melanie Bowen   MRN: [id number] DOB:  [birth year]   ADMIT DATE: 11/26/2020  INDICATIONS:  Bacteremia PROCEDURE:   Informed consent was obtained prior to the procedure. The risks, benefits and alternatives for the procedure were discussed and the patient comprehended these risks.  Risks include, but are not limited to, cough, sore throat, vomiting, nausea, somnolence, esophageal and stomach trauma or perforation, bleeding, low blood pressure, aspiration, pneumonia, infection, trauma to the teeth and death.    After a procedural time-out, the patient was sedated per the department of anesthesia. The transesophageal probe was inserted in the esophagus and stomach without difficulty and multiple views were obtained. I was present for the entire procedure.    COMPLICATIONS:    There were no immediate complications.  FINDINGS:  LEFT VENTRICLE: EF = 55%. No regional wall motion abnormalities.  RIGHT VENTRICLE: Normal size and function.   LEFT ATRIUM:Normal  LEFT ATRIAL APPENDAGE: No thrombus.   RIGHT ATRIUM: mildly enlarged  AORTIC VALVE:  [place]. Moderately calcified with possible tiny vegetatoin on the noncoronary cusp. Moderate ai  MITRAL VALVE:    Normal. Trivial mr. No vegetatoins seen  TRICUSPID VALVE: Normal. No vegetations seen.   PULMONIC VALVE: Grossly normal.  INTERATRIAL SEPTUM:Grosly intact  PERICARDIUM: No effusion  DESCENDING AORTA: NOrmal  CONCLUSION: Normal lv function. Calcified aortic valve Possible very tiny vegetation on the noncoronary cusp

## 2020-12-01 NOTE — Progress Notes (Signed)
Peripherally Inserted Central Catheter Placement  The IV Nurse has discussed with the patient and/or persons authorized to consent for the patient, the purpose of this procedure and the potential benefits and risks involved with this procedure.  The benefits include less needle sticks, lab draws from the catheter, and the patient may be discharged home with the catheter. Risks include, but not limited to, infection, bleeding, blood clot (thrombus formation), and puncture of an artery; nerve damage and irregular heartbeat and possibility to perform a PICC exchange if needed/ordered by physician.  Alternatives to this procedure were also discussed.  Bard Power PICC patient education guide, fact sheet on infection prevention and patient information card has been provided to patient /or left at bedside.    PICC Placement Documentation  PICC Single Lumen 123XX123 PICC Left Basilic 42 cm 1 cm (Active)  Indication for Insertion or Continuance of Line Prolonged intravenous therapies;Home intravenous therapies (PICC only) 12/01/20 1426  Exposed Catheter (cm) 1 cm 12/01/20 1426  Site Assessment Clean;Dry;Intact 12/01/20 1426  Line Status Flushed;Saline locked;Blood return noted 12/01/20 1426  Dressing Type Transparent;Securing device 12/01/20 1426  Dressing Status Clean;Dry;Intact 12/01/20 1426  Antimicrobial disc in place? Yes 12/01/20 1426  Dressing Intervention New dressing 12/01/20 1426  Dressing Change Due 12/08/20 12/01/20 1426    LUA used for PICC placement. R Arm could not extend due to fracture.   Enos Fling 12/01/2020, 2:28 PM

## 2020-12-02 ENCOUNTER — Encounter: Payer: Self-pay | Admitting: Cardiology

## 2020-12-02 ENCOUNTER — Inpatient Hospital Stay: Payer: Self-pay

## 2020-12-02 LAB — BASIC METABOLIC PANEL
Anion gap: 6 (ref 5–15)
BUN: 8 mg/dL (ref 6–20)
CO2: 22 mmol/L (ref 22–32)
Calcium: 8.5 mg/dL — ABNORMAL LOW (ref 8.9–10.3)
Chloride: 110 mmol/L (ref 98–111)
Creatinine, Ser: 1.2 mg/dL — ABNORMAL HIGH (ref 0.44–1.00)
GFR, Estimated: 52 mL/min — ABNORMAL LOW (ref >60)
Glucose, Bld: 84 mg/dL (ref 70–99)
Potassium: 3.4 mmol/L — ABNORMAL LOW (ref 3.5–5.1)
Sodium: 138 mmol/L (ref 135–145)

## 2020-12-02 LAB — CBC
HCT: 25.5 % — ABNORMAL LOW (ref 36.0–46.0)
Hemoglobin: 8.6 g/dL — ABNORMAL LOW (ref 12.0–15.0)
MCH: 31.4 pg (ref 26.0–34.0)
MCHC: 33.7 g/dL (ref 30.0–36.0)
MCV: 93.1 fL (ref 80.0–100.0)
Platelets: 408 10*3/uL — ABNORMAL HIGH (ref 150–400)
RBC: 2.74 MIL/uL — ABNORMAL LOW (ref 3.87–5.11)
RDW: 15.9 % — ABNORMAL HIGH (ref 11.5–15.5)
WBC: 4.9 10*3/uL (ref 4.0–10.5)
nRBC: 0 % (ref 0.0–0.2)

## 2020-12-02 LAB — CULTURE, BLOOD (ROUTINE X 2)
Culture: NO GROWTH
Culture: NO GROWTH
Special Requests: ADEQUATE
Special Requests: ADEQUATE

## 2020-12-02 MED ORDER — HEPARIN SOD (PORK) LOCK FLUSH 100 UNIT/ML IV SOLN
500.0000 [IU] | Freq: Once | INTRAVENOUS | Status: AC
  Administered 2020-12-02: 500 [IU] via INTRAVENOUS
  Filled 2020-12-02: qty 5

## 2020-12-02 NOTE — Discharge Instructions (Signed)
Bacteremia, Adult °Bacteremia is the presence of bacteria in the blood. When bacteria enter the bloodstream, they can cause a life-threatening reaction called sepsis. Sepsis is a medical emergency. °What are the causes? °This condition is caused by bacteria that get into the blood. Bacteria can enter the blood from an infection, including: °· A skin infection or injury, such as a burn or a cut. °· A lung infection (pneumonia). °· An infection in the stomach or intestines. °· An infection in the bladder or urinary system (urinary tract infection). °· A bacterial infection in another part of the body that spreads to the blood. °Bacteria can also enter the blood during a dental or medical procedure, from bleeding gums, or through use of an unclean needle. °What increases the risk? °This condition is more likely to develop in children, older adults, and people who have: °· A long-term (chronic) disease or condition like diabetes or chronic kidney failure. °· An artificial joint or heart valve, or heart valve disease. °· A tube inserted to treat a medical condition, such as a urinary catheter or IV. °· A weak disease-fighting system (immune system). °· Injected illegal drugs. °· Been hospitalized for more than 10 days in a row. °What are the signs or symptoms? °Symptoms of this condition include: °· Fever and chills. °· Fast heartbeat and shortness of breath. °· Dizziness, weakness, and low blood pressure. °· Confusion or anxiety. °· Pain in the abdomen, nausea, vomiting, and diarrhea. °Bacteremia that has spread to other parts of the body may cause symptoms in those areas. In some cases, there are no symptoms. °How is this diagnosed? °This condition may be diagnosed with a physical exam and tests, such as: °· Blood tests to check for bacteria (cultures) or other signs of infection. °· Tests of any tubes that you have had inserted. These tests check for a source of infection. °· Urine tests to check for bacteria in the  urine. °· Imaging tests, such as an X-ray, a CT scan, an MRI, or a heart ultrasound. These check for a source of infection in other parts of your body, such as your lungs, heart valves, or joints.   °How is this treated? °This condition is usually treated in the hospital. If you are treated at home, you may need to return to the hospital for medicines, blood tests, and evaluation. Treatment may include: °· Antibiotic medicines. These may be given by mouth or directly into your blood through an IV. You may need antibiotics for several weeks. At first, you may be given an antibiotic to kill most types of blood bacteria. If tests show that a certain kind of bacteria is causing the problem, you may be given a different antibiotic. °· IV fluids. °· Removing any catheter or device that could be a source of infection. °· Blood pressure and breathing support, if needed. °· Surgery to control the source or the spread of infection, such as surgery to remove an implanted device, abscess, or infected tissue. °Follow these instructions at home: °Medicines °· Take over-the-counter and prescription medicines only as told by your health care provider. °· If you were prescribed an antibiotic medicine, take it as told by your health care provider. Do not stop taking the antibiotic even if you start to feel better. °General instructions °· Rest as needed. Ask your health care provider when you may return to normal activities. °· Drink enough fluid to keep your urine pale yellow. °· Do not use any products that contain nicotine   or tobacco, such as cigarettes, e-cigarettes, and chewing tobacco. If you need help quitting, ask your health care provider. °· Keep all follow-up visits as told by your health care provider. This is important.   °How is this prevented? °· Wash your hands regularly with soap and water. If soap and water are not available, use hand sanitizer. °· You should wash your hands: °? After using the toilet or changing a  diaper. °? Before preparing, cooking, serving, or eating food. °? While caring for a sick person or while visiting someone in a hospital. °? Before and after changing bandages (dressings) over wounds. °· Clean any scrapes or cuts with soap and water and cover them with a clean bandage. °· Get vaccinations as recommended by your health care provider. °· Practice good oral hygiene. Brush your teeth two times a day, and floss regularly. °· Take good care of your skin. This includes bathing and moisturizing on a regular basis.   °Contact a health care provider if: °· Your symptoms get worse, and medicines do not help. °· You have severe pain. °Get help right away if you have: °· Pain. °· A fever or chills. °· Trouble breathing. °· A fast heart rate. °· Skin that is blotchy, pale, or clammy. °· Confusion. °· Weakness. °· Lack of energy or unusual sleepiness. °· New symptoms that develop after treatment has started. °These symptoms may represent a serious problem that is an emergency. Do not wait to see if the symptoms will go away. Get medical help right away. Call your local emergency services (911 in the [place] Do not drive yourself to the hospital. °Summary °· Bacteremia is the presence of bacteria in the blood. When bacteria enter the bloodstream, they can cause a life-threatening reaction called sepsis. °· Bacteremia is usually treated with antibiotic medicines in the hospital. °· If you were prescribed an antibiotic medicine, take it as told by your health care provider. Do not stop taking the antibiotic even if you start to feel better. °· Get help right away if you have any new symptoms that develop after treatment has started. °This information is not intended to replace advice given to you by your health care provider. Make sure you discuss any questions you have with your health care provider. °Document Revised: 01/05/2019 Document Reviewed: 01/05/2019 °Elsevier Patient Education © 2021 [employer] ° °

## 2020-12-02 NOTE — Discharge Summary (Signed)
Melanie Bowen XTK:[id number] DOB: August 27, 1961 DOA: 11/26/2020  PCP: Theotis Burrow, MD  Admit date: 11/26/2020 Discharge date: 12/02/2020  Time spent: 35 minutes  Recommendations for Outpatient Follow-up:  1. PCP and GI surgery f/u     Discharge Diagnoses:  Active Problems:   Bacteremia   VRE (vancomycin resistant enterococcus) culture positive   Discharge Condition: good  Diet recommendation: regular  Filed Weights   11/26/20 1457  Weight: 92 kg    History of present illness:  Melanie Bowen is a 60 y.o. female with medical history significant for COPD not on oxygen supplementation at baseline, GERD, essential hypertension, HIV on HAART, CVA, PE now off anticoagulation, depression, seizure disorder, perforated diverticulitis and abscess status post Hartman's in November 2021, enterocutaneous fistula and, malnutrition with TPN dependence who presented to [facility] ED after receiving a call back from the emergency department due to a positive blood culture.  Patient presented initially the day prior to this admission due to complaints of chills for the past 2 days.  Patient had blood cultures drawn in the ED that resulted today 1 out of 2 peripheral blood cultures positive for vancomycin-resistant Enterococcus faecalis.  Patient has a PICC line which she uses for her TPN.  EDP contacted infectious disease who recommended starting IV daptomycin.  Blood cultures were repeated in the ED today.  EDP requested admission for VRE bacteremia.  Hospital Course:  Vancomycin resistant Enterococcus faecalis bacteremia, POA Blood cultures drawn peripherally on 11/25/2020 revealed vancomycin resistant Enterococcus faecalis 1 out of 2 bottles, patient called at home to return to the ED. 3/30 Repeated blood cultures x2 growing Enterococcus faecalis. Repeat cultures 3/31 ngtd. Most likely 2/2 picc which has been removed and replaced. S/p daptomycin, now on ampicillin and ceftriaxone per ID. TEE on  4/4 could not rule out vegetations thus will need 6 wks amp/ceftriaxone, last day 5/11. Set up with home infusion prior to discharge.   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 As per general surgery patient does not need TPN. She reports she is able to eat some food and has tolerated diet here. Will f/u with GI surg for take-down.  AKI Resolved w/ gentle hydration  HIV on HAART Management per infectious disease - Resumed homeHAART  Chronic anxiety/depression/GERD/COPD not on oxygen supplementation Resumed home regimen  History of PE Off oral anticoagulation, no signs recurrence  Procedures:  PICC placement, TEE   Consultations:  ID  Discharge Exam: Vitals:   12/01/20 2338 12/02/20 0541  BP: (!) 126/53 (!) 135/59  Pulse: 65 63  Resp: 20 18  Temp: 98.6 F (37 C) 98.6 F (37 C)  SpO2: 99% 98%    General:  alert oriented to time, place, and person.  Appear in mild distress, affect appropriate Eyes: PERRLA ENT: Oral Mucosa Clear, moist  Neck: no JVD,  Cardiovascular: S1 and S2 Present, no Murmur,  Respiratory: good respiratory effort, Bilateral Air entry equal and Decreased, no Crackles, no wheezes Abdomen: Bowel Sound present, Soft and no tenderness, ileostomy bag attached with stool ouptut Skin: no rashes Extremities: no Pedal edema, no calf tenderness Neurologic: ,moving all 4 extremities equally  Discharge Instructions   Discharge Instructions    [facility] pharmacist to adjust dose for Vancomycin, Aminoglycosides and other anti-infective therapies as requested by physician.   Complete by: As directed  [facility] to provide Cath Flo 2m   Complete by: As directed    Administer for PICC line occlusion and as ordered by physician for other access device  issues.   Anaphylaxis Kit: Provided to treat any anaphylactic reaction to the medication being provided to the patient if First Dose or when requested by physician   Complete by: As directed    Epinephrine 111mml vial / amp: Administer 0.43m48m0.43ml57mubcutaneously once for moderate to severe anaphylaxis, nurse to call physician and pharmacy when reaction occurs and call 911 if needed for immediate care   Diphenhydramine 50mg6mIV vial: Administer 25-50mg 76mM PRN for first dose reaction, rash, itching, mild reaction, nurse to call physician and pharmacy when reaction occurs   Sodium Chloride 0.9% NS 500ml I24mdminister if needed for hypovolemic blood pressure drop or as ordered by physician after call to physician with anaphylactic reaction   Change dressing on IV access line weekly and PRN   Complete by: As directed    Diet - low sodium heart healthy   Complete by: As directed    Flush IV access with Sodium Chloride 0.9% and Heparin 10 units/ml or 100 units/ml   Complete by: As directed    Home infusion instructions   Complete by: As directed    Instructions: Flushing of vascular access device: 0.9% NaCl pre/post medication administration and prn patency; Heparin 100 u/ml, 5ml for343mplanted ports and Heparin 10u/ml, 5ml for 26m other central venous catheters.   Home infusion instructions - [facility]   Complete by: As directed    Instructions: Flush IV access with Sodium Chloride 0.9% and Heparin 10units/ml or 100units/ml   Change dressing on IV access line: Weekly and PRN   Instructions Cath Flo 2mg: Admi75mter for PICC Line occlusion and as ordered by physician for other access device   [facility] pharmacist to adjust dose for: Vancomycin, Aminoglycosides and other anti-infective therapies as requested by physician   Increase activity slowly   Complete by: As directed    Method of administration may be changed at the discretion of home infusion pharmacist based  upon assessment of the patient and/or caregiver's ability to self-administer the medication ordered   Complete by: As directed      Allergies as of 12/02/2020      Reactions   Acetaminophen Nausea And Vomiting   Ibuprofen Other (See Comments)   Reports causes bleeding   Gabapentin Rash   Morphine Itching, Rash   Morphine And Related Itching   Zanaflex  [tizanidine] Rash      Medication List    STOP taking these medications   amiodarone 200 MG tablet Commonly known as: PACERONE   potassium chloride 10 MEQ tablet Commonly known as: KLOR-CON     TAKE these medications   albuterol 108 (90 Base) MCG/ACT inhaler Commonly known as: VENTOLIN HFA Inhale 2 puffs into the lungs every 6 (six) hours as needed for wheezing or shortness of breath.   ampicillin  IVPB Inject 12 g into the vein continuous. Infuse ampicillin 12gm daily as continuous infusion over 24h.  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   ascorbic acid 500 MG tablet Commonly known as: VITAMIN  C Take 500 mg by mouth 2 (two) times daily.   atorvastatin 20 MG tablet Commonly known as: LIPITOR Take 20 mg by mouth daily.   Biktarvy 50-200-25 MG Tabs tablet Generic drug: bictegravir-emtricitabine-tenofovir AF Take 1 tablet by mouth daily.   cefTRIAXone  IVPB Commonly known as: ROCEPHIN Inject 2 g into the vein every 12 (twelve) hours. 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   Dexilant 60 MG capsule Generic drug: dexlansoprazole Take 60 mg by mouth daily.   famotidine 20 MG tablet Commonly known as: PEPCID Take 20 mg by mouth daily.   FLUoxetine 20 MG capsule Commonly known as: PROZAC Take 20 mg by mouth daily.   gabapentin 600 MG tablet Commonly known as: NEURONTIN Take 600 mg by mouth 3 (three) times daily as needed.   Melatonin 10 MG Tabs Take 1 tablet by mouth daily.   multivitamin with minerals Tabs tablet Take 1 tablet by mouth daily.   oxyCODONE-acetaminophen 5-325 MG tablet Commonly known as: PERCOCET/ROXICET Take 2 tablets by mouth every 4 (four) hours as needed for severe pain.   Wixela Inhub 250-50 MCG/DOSE Aepb Generic drug: Fluticasone-Salmeterol Inhale 1 puff into the lungs 2 (two) times daily.            Home Infusion Instuctions  (From admission, onward)         Start     Ordered   12/01/20 0000  Home infusion instructions       Question:  Instructions  Answer:  Flushing of vascular access device: 0.9% NaCl pre/post medication administration and prn patency; Heparin 100 u/ml, 45m for implanted ports and Heparin 10u/ml, 559mfor all other central venous catheters.   12/01/20 1219           Discharge Care Instructions  (From admission, onward)         Start     Ordered   12/02/20 0000  Change dressing on IV access line weekly and PRN  (Home infusion instructions - [facility] )        12/02/20 0920         Allergies  Allergen Reactions  . Acetaminophen Nausea And Vomiting  . Ibuprofen Other (See Comments)    Reports causes bleeding  . Gabapentin Rash  . Morphine Itching and Rash  . Morphine And Related Itching  . Zanaflex  [Tizanidine] Rash      The results of significant diagnostics from this hospitalization (including imaging, microbiology, ancillary and laboratory) are listed  below for reference.    Significant Diagnostic Studies: ECHOCARDIOGRAM COMPLETE  Result Date: 11/27/2020    ECHOCARDIOGRAM REPORT   Patient Name:   ANWILLER OSORNOate of Exam: 11/27/2020 Medical Rec #:  [id number]      Height:       66.0 in Accession #:    [id number]     Weight:       202.8 lb Date of Birth:  02/1961/07/17      BSA:          2.012 m Patient Age:    [age]         BP:  107/54 mmHg Patient Gender: F                HR:           72 bpm. Exam Location:  [facility] Procedure: 2D Echo, Color Doppler and Cardiac Doppler Indications:     R78.81 Bacteremia  History:         Patient has no prior history of Echocardiogram examinations.                  COPD and HIV; Risk Factors:Hypertension and Current Smoker.  Sonographer:     Charmayne Sheer RDCS (AE) Referring Phys:  [id number] [place] Diagnosing Phys: Ida Rogue MD  Sonographer Comments: No subcostal window. IMPRESSIONS  1. Left ventricular ejection fraction, by estimation, is 60 to 65%. The left ventricle has normal function. The left ventricle has no regional wall motion abnormalities. There is mild left ventricular hypertrophy. Left ventricular diastolic parameters are consistent with Grade II diastolic dysfunction (pseudonormalization).  2. Right ventricular systolic function is normal. The right ventricular size is normal.  3. The mitral valve is normal in structure. Mild mitral valve regurgitation.  4. There is mild thickening of the aortic valve. Aortic valve regurgitation is mild to moderate. Mild to moderate aortic valve sclerosis/calcification is present, without any evidence of aortic stenosis. FINDINGS  Left Ventricle: Left ventricular ejection fraction, by estimation, is 60 to 65%. The left ventricle has normal function. The left ventricle has no regional wall motion abnormalities. The left ventricular internal cavity size was normal in size. There is  mild left ventricular hypertrophy. Left ventricular diastolic parameters  are consistent with Grade II diastolic dysfunction (pseudonormalization). Right Ventricle: The right ventricular size is normal. No increase in right ventricular wall thickness. Right ventricular systolic function is normal. Left Atrium: Left atrial size was normal in size. Right Atrium: Right atrial size was normal in size. Pericardium: There is no evidence of pericardial effusion. Mitral Valve: The mitral valve is normal in structure. Mild mitral valve regurgitation. No evidence of mitral valve stenosis. MV peak gradient, 6.0 mmHg. The mean mitral valve gradient is 3.0 mmHg. Tricuspid Valve: The tricuspid valve is normal in structure. Tricuspid valve regurgitation is not demonstrated. No evidence of tricuspid stenosis. Aortic Valve: The aortic valve is normal in structure. There is mild thickening of the aortic valve. Aortic valve regurgitation is mild to moderate. Aortic regurgitation PHT measures 287 msec. Mild to moderate aortic valve sclerosis/calcification is present, without any evidence of aortic stenosis. Aortic valve mean gradient measures 11.0 mmHg. Aortic valve peak gradient measures 19.2 mmHg. Aortic valve area, by VTI measures 2.17 cm. Pulmonic Valve: The pulmonic valve was normal in structure. Pulmonic valve regurgitation is not visualized. No evidence of pulmonic stenosis. Aorta: The aortic root is normal in size and structure. Venous: The inferior vena cava is normal in size with greater than 50% respiratory variability, suggesting right atrial pressure of 3 mmHg. IAS/Shunts: No atrial level shunt detected by color flow Doppler.  LEFT VENTRICLE PLAX 2D LVIDd:         5.20 cm  Diastology LVIDs:         3.00 cm  LV e' medial:    8.49 cm/s LV PW:         1.00 cm  LV E/e' medial:  11.8 LV IVS:        0.80 cm  LV e' lateral:   12.70 cm/s LVOT diam:     2.10 cm  LV E/e' lateral:  7.9 LV SV:         103 LV SV Index:   51 LVOT Area:     3.46 cm  RIGHT VENTRICLE RV Basal diam:  3.00 cm LEFT ATRIUM              Index       RIGHT ATRIUM           Index LA diam:        3.90 cm 1.94 cm/m  RA Area:     11.90 cm LA Vol (A2C):   61.4 ml 30.52 ml/m RA Volume:   24.90 ml  12.38 ml/m LA Vol (A4C):   71.7 ml 35.64 ml/m LA Biplane Vol: 70.6 ml 35.09 ml/m  AORTIC VALVE                    PULMONIC VALVE AV Area (Vmax):    2.17 cm     PV Vmax:       1.13 m/s AV Area (Vmean):   2.32 cm     PV Vmean:      72.900 cm/s AV Area (VTI):     2.17 cm     PV VTI:        0.221 m AV Vmax:           219.00 cm/s  PV Peak grad:  5.1 mmHg AV Vmean:          157.000 cm/s PV Mean grad:  3.0 mmHg AV VTI:            0.472 m AV Peak Grad:      19.2 mmHg AV Mean Grad:      11.0 mmHg LVOT Vmax:         137.00 cm/s LVOT Vmean:        105.000 cm/s LVOT VTI:          0.296 m LVOT/AV VTI ratio: 0.63 AI PHT:            287 msec  AORTA Ao Root diam: 3.30 cm MITRAL VALVE MV Area (PHT): 3.27 cm     SHUNTS MV Area VTI:   3.97 cm     Systemic VTI:  0.30 m MV Peak grad:  6.0 mmHg     Systemic Diam: 2.10 cm MV Mean grad:  3.0 mmHg MV Vmax:       1.22 m/s MV Vmean:      85.3 cm/s MV Decel Time: 232 msec MV E velocity: 100.00 cm/s MV A velocity: 74.10 cm/s MV E/A ratio:  1.35 Ida Rogue MD Electronically signed by Ida Rogue MD Signature Date/Time: 11/27/2020/3:19:39 PM    Final    ECHO TEE  Result Date: 12/01/2020    TRANSESOPHOGEAL ECHO REPORT   Patient Name:   Melanie Bowen Date of Exam: 12/01/2020 Medical Rec #:  [id number]        Height:       66.0 in Accession #:    [id number]       Weight:       202.8 lb Date of Birth:  1961-05-13        BSA:          2.012 m Patient Age:    34 years         BP:           133/55 mmHg Patient Gender: F                HR:  62 bpm. Exam Location:  [facility] Procedure: Transesophageal Echo and Color Doppler Indications:     R78.81 Bacteremia  History:         Patient has prior history of Echocardiogram examinations, most                  recent 11/27/2020. HIV; Risk Factors:Current Smoker and                   Hypertension.  Sonographer:     Charmayne Sheer RDCS (AE) Referring Phys:  [place] Diagnosing Phys: Bartholome Bill MD PROCEDURE: TEE procedure time was 15 minutes. The transesophogeal probe was passed without difficulty through the esophogus of the patient. Imaged were obtained with the patient in a left lateral decubitus position. Sedation performed by different physician. The patient was monitored while under deep sedation. Anesthestetic sedation was provided intravenously by Anesthesiology: 59m of Propofol. Image quality was excellent. The patient's vital signs; including heart rate, blood pressure, and oxygen saturation; remained stable throughout the procedure. The patient developed no complications during the procedure. IMPRESSIONS  1. Left ventricular ejection fraction, by estimation, is 55 to 60%. The left ventricle has normal function. The left ventricle has no regional wall motion abnormalities.  2. Right ventricular systolic function is normal. The right ventricular size is normal.  3. No left atrial/left atrial appendage thrombus was detected.  4. The mitral valve is grossly normal. Trivial mitral valve regurgitation.  5. Aortic valve vegetation is visualized on the noncoronary.  6. The aortic valve is calcified. Aortic valve regurgitation is moderate. FINDINGS  Left Ventricle: Left ventricular ejection fraction, by estimation, is 55 to 60%. The left ventricle has normal function. The left ventricle has no regional wall motion abnormalities. The left ventricular internal cavity size was normal in size. Right Ventricle: The right ventricular size is normal. No increase in right ventricular wall thickness. Right ventricular systolic function is normal. Left Atrium: Left atrial size was normal in size. No left atrial/left atrial appendage thrombus was detected. Right Atrium: Right atrial size was normal in size. Pericardium: There is no evidence of pericardial effusion. Mitral Valve: The mitral  valve is grossly normal. Trivial mitral valve regurgitation. There is no evidence of mitral valve vegetation. Tricuspid Valve: The tricuspid valve is grossly normal. Tricuspid valve regurgitation is trivial. Aortic Valve: The aortic valve is calcified. Aortic valve regurgitation is moderate. A non-mobile vegetation is seen on the noncoronary. Pulmonic Valve: The pulmonic valve was not well visualized. Pulmonic valve regurgitation is trivial. Aorta: The aortic root is normal in size and structure. IAS/Shunts: No atrial level shunt detected by color flow Doppler. KBartholome BillMD Electronically signed by KBartholome BillMD Signature Date/Time: 12/01/2020/12:23:50 PM    Final    [place] SITE RITE  Result Date: 12/01/2020 If Site Rite image not attached, placement could not be confirmed due to current cardiac rhythm.  CT CHEST ABDOMEN PELVIS WO CONTRAST  Result Date: 11/26/2020 CLINICAL DATA:  Bacteremia, PICC line infection EXAM: CT CHEST, ABDOMEN AND PELVIS WITHOUT CONTRAST TECHNIQUE: Multidetector CT imaging of the chest, abdomen and pelvis was performed following the standard protocol without IV contrast. Unenhanced CT was performed per clinician order. Lack of IV contrast limits sensitivity and specificity, especially for evaluation of abdominal/pelvic solid viscera. COMPARISON:  06/09/2020 FINDINGS: CT CHEST FINDINGS Cardiovascular: Unenhanced imaging of the heart and great vessels demonstrates no pericardial effusion. Atherosclerosis of the aortic arch and coronary vessels. No evidence of thoracic aortic aneurysm. Mediastinum/Nodes:  No enlarged mediastinal, hilar, or axillary lymph nodes. Thyroid gland, trachea, and esophagus demonstrate no significant findings. Lungs/Pleura: There is mild bilateral lower lobe bronchial wall thickening with scattered tree in bud airspace disease consistent with bronchitis and bronchopneumonia. Aspiration could give a similar appearance. Subpleural scarring and fibrosis noted,  most pronounced within the apices. No effusion or pneumothorax. The central airways are patent. Musculoskeletal: No acute or destructive bony lesions. Severe degenerative changes of the right shoulder. CT ABDOMEN PELVIS FINDINGS Hepatobiliary: Stable hyperdense material along the hepatic capsule of the left lobe, which may reflect calcification or previous barium extravasation. Otherwise the liver is unremarkable. Gallbladder is decompressed, with no evidence of cholelithiasis or cholecystitis. Pancreas: Unremarkable. No pancreatic ductal dilatation or surrounding inflammatory changes. Spleen: Normal in size without focal abnormality. Adrenals/Urinary Tract: No urinary tract calculi or obstructive uropathy. The bladder is decompressed which limits its evaluation. The adrenals are unremarkable. Stomach/Bowel: No bowel obstruction or ileus. The colostomy left mid abdomen. No bowel wall thickening or inflammatory change. Normal appendix right lower quadrant. Vascular/Lymphatic: Extensive atherosclerosis of the aorta and its branches. No pathologic adenopathy. Reproductive: Uterus and bilateral adnexa are unremarkable. Other: No free fluid or free gas. Peri-incisional hernias are seen within the midline abdomen, containing small portions of the mid jejunum. No evidence of incarceration or obstruction. Musculoskeletal: Postsurgical changes from L3 through L5 from previous discectomy and posterior fusion. No acute or destructive bony lesions. Reconstructed images demonstrate no additional findings. IMPRESSION: 1. Mild bilateral lower lobe bronchial wall thickening with scattered tree in bud airspace disease consistent with bronchitis and bronchopneumonia. Aspiration could give a similar appearance. 2. Peri-incisional hernias within the midline abdomen, containing small portions of the mid jejunum. No evidence of incarceration or obstruction. 3. Aortic Atherosclerosis (ICD10-I70.0). Electronically Signed   By: Randa Ngo M.D.   On: 11/26/2020 18:42    Microbiology: Recent Results (from the past 240 hour(s))  Culture, blood (single) w Reflex to ID Panel     Status: Abnormal   Collection Time: 11/25/20  7:22 PM   Specimen: BLOOD LEFT HAND  Result Value Ref Range Status   Specimen Description   Final    BLOOD LEFT HAND Performed at [facility], [place] [address], [place], [place] [address]    Special Requests   Final    BOTTLES DRAWN AEROBIC AND ANAEROBIC Blood Culture adequate volume Performed at [facility], [address] [place], [place] [address]    Culture  Setup Time   Final    Organism ID to follow GRAM POSITIVE COCCI IN BOTH AEROBIC AND ANAEROBIC BOTTLES CRITICAL RESULT CALLED TO, READ BACK BY AND VERIFIED WITH: Thereasa Solo, RN AT 1313 11/26/20 [place] Performed at [facility], [address] [place], [place] [address]    Culture (A)  Final    ENTEROCOCCUS FAECALIS VANCOMYCIN RESISTANT ENTEROCOCCUS ISOLATED    Report Status 11/28/2020 FINAL  Final   Organism ID, Bacteria ENTEROCOCCUS FAECALIS  Final      Susceptibility   Enterococcus faecalis - MIC*    AMPICILLIN <=2 SENSITIVE Sensitive     VANCOMYCIN >=32 RESISTANT Resistant     GENTAMICIN SYNERGY SENSITIVE Sensitive     LINEZOLID 1 SENSITIVE Sensitive     * ENTEROCOCCUS FAECALIS  Blood Culture ID Panel (Reflexed)     Status: Abnormal   Collection Time: 11/25/20  7:22 PM  Result Value Ref Range Status   Enterococcus faecalis DETECTED (A) NOT DETECTED Final    Comment: CRITICAL  RESULT CALLED TO, READ BACK BY AND VERIFIED WITH:  STEPHANIE RUDD AT 8828 11/26/20 SDR    Enterococcus Faecium NOT DETECTED NOT DETECTED Final   Listeria monocytogenes NOT DETECTED NOT DETECTED Final   Staphylococcus species NOT DETECTED NOT DETECTED Final   Staphylococcus aureus (BCID) NOT DETECTED NOT DETECTED Final   Staphylococcus epidermidis NOT DETECTED NOT DETECTED Final   Staphylococcus lugdunensis NOT DETECTED NOT  DETECTED Final   Streptococcus species NOT DETECTED NOT DETECTED Final   Streptococcus agalactiae NOT DETECTED NOT DETECTED Final   Streptococcus pneumoniae NOT DETECTED NOT DETECTED Final   Streptococcus pyogenes NOT DETECTED NOT DETECTED Final   A.calcoaceticus-baumannii NOT DETECTED NOT DETECTED Final   Bacteroides fragilis NOT DETECTED NOT DETECTED Final   Enterobacterales NOT DETECTED NOT DETECTED Final   Enterobacter cloacae complex NOT DETECTED NOT DETECTED Final   Escherichia coli NOT DETECTED NOT DETECTED Final   Klebsiella aerogenes NOT DETECTED NOT DETECTED Final   Klebsiella oxytoca NOT DETECTED NOT DETECTED Final   Klebsiella pneumoniae NOT DETECTED NOT DETECTED Final   Proteus species NOT DETECTED NOT DETECTED Final   Salmonella species NOT DETECTED NOT DETECTED Final   Serratia marcescens NOT DETECTED NOT DETECTED Final   Haemophilus influenzae NOT DETECTED NOT DETECTED Final   Neisseria meningitidis NOT DETECTED NOT DETECTED Final   Pseudomonas aeruginosa NOT DETECTED NOT DETECTED Final   Stenotrophomonas maltophilia NOT DETECTED NOT DETECTED Final   Candida albicans NOT DETECTED NOT DETECTED Final   Candida auris NOT DETECTED NOT DETECTED Final   Candida glabrata NOT DETECTED NOT DETECTED Final   Candida krusei NOT DETECTED NOT DETECTED Final   Candida parapsilosis NOT DETECTED NOT DETECTED Final   Candida tropicalis NOT DETECTED NOT DETECTED Final   Cryptococcus neoformans/gattii NOT DETECTED NOT DETECTED Final   Vancomycin resistance DETECTED (A) NOT DETECTED Final    Comment: CRITICAL RESULT CALLED TO, READ BACK BY AND VERIFIED WITH:  STEPHANIE RUDD AT 0034 11/26/20 SDR Performed at [facility], [address] [place], [place] [address]   Blood culture (routine x 2)     Status: Abnormal   Collection Time: 11/26/20  4:14 PM   Specimen: BLOOD  Result Value Ref Range Status   Specimen Description   Final    BLOOD BLOOD LEFT HAND Performed at [facility], [address], [place], [place] [address]    Special Requests   Final    BOTTLES DRAWN AEROBIC AND ANAEROBIC Blood Culture adequate volume Performed at [facility], [address] [place], [place] [address]    Culture  Setup Time   Final    GRAM POSITIVE COCCI IN BOTH AEROBIC AND ANAEROBIC BOTTLES CRITICAL RESULT CALLED TO, READ BACK BY AND VERIFIED WITH: AMY THOMPSON AT 1100 11/27/20 SDR Performed at [facility], [address] [place], [place] [address]    Culture (A)  Final    ENTEROCOCCUS FAECALIS SUSCEPTIBILITIES PERFORMED ON PREVIOUS CULTURE WITHIN THE LAST 5 DAYS. Performed at [facility], [place] [address] [place], [place] [address]    Report Status 11/29/2020 FINAL  Final  SARS CORONAVIRUS 2 (TAT 6-24 HRS) Nasopharyngeal Nasopharyngeal Swab     Status: None   Collection Time: 11/26/20  5:00 PM   Specimen: Nasopharyngeal Swab  Result Value Ref Range Status   SARS Coronavirus 2 NEGATIVE NEGATIVE Final    Comment: (NOTE) SARS-CoV-2 target nucleic acids are NOT DETECTED.  The SARS-CoV-2 RNA is generally detectable in upper and lower respiratory specimens  during the acute phase of infection. Negative results do not preclude SARS-CoV-2 infection, do not rule out co-infections with other pathogens, and should not be used as the sole basis for treatment or other patient management decisions. Negative results must be combined with clinical observations, patient history, and epidemiological information. The expected result is Negative.  Fact Sheet for Patients: SugarRoll.be  Fact Sheet for Healthcare Providers: https://www.woods-mathews.com/  This test is not yet approved or cleared by the [place] FDA and  has been authorized for detection and/or diagnosis of SARS-CoV-2 by FDA under an Emergency Use Authorization (EUA). This EUA will remain  in effect (meaning this test can be used)  for the duration of the COVID-19 declaration under Se ction 564(b)(1) of the Act, 21 U.S.C. section 360bbb-3(b)(1), unless the authorization is terminated or revoked sooner.  Performed at [facility], [place] [address] [place], [place] [address]   Blood culture (routine x 2)     Status: Abnormal   Collection Time: 11/26/20  5:40 PM   Specimen: BLOOD  Result Value Ref Range Status   Specimen Description   Final    BLOOD LUE Performed at [facility], [address] [place], [place] [address]    Special Requests   Final    BOTTLES DRAWN AEROBIC AND ANAEROBIC Blood Culture adequate volume Performed at [facility], [address] [place], [place] [address]    Culture  Setup Time   Final    GRAM POSITIVE COCCI IN BOTH AEROBIC AND ANAEROBIC BOTTLES CRITICAL RESULT CALLED TO, READ BACK BY AND VERIFIED WITH: AMY THOMPSON AT 1001 11/27/20 SDR    Culture (A)  Final    ENTEROCOCCUS FAECALIS SUSCEPTIBILITIES PERFORMED ON PREVIOUS CULTURE WITHIN THE LAST 5 DAYS. Performed at [facility], [place] [address] [place], [place] [address]    Report Status 11/29/2020 FINAL  Final  Blood Culture ID Panel (Reflexed)     Status: Abnormal   Collection Time: 11/26/20  5:40 PM  Result Value Ref Range Status   Enterococcus faecalis DETECTED (A) NOT DETECTED Final    Comment: CRITICAL RESULT CALLED TO, READ BACK BY AND VERIFIED WITH:  AMY THOMPSON AT 1001 11/27/20 SDR    Enterococcus Faecium NOT DETECTED NOT DETECTED Final   Listeria monocytogenes NOT DETECTED NOT DETECTED Final   Staphylococcus species NOT DETECTED NOT DETECTED Final   Staphylococcus aureus (BCID) NOT DETECTED NOT DETECTED Final   Staphylococcus epidermidis NOT DETECTED NOT DETECTED Final   Staphylococcus lugdunensis NOT DETECTED NOT DETECTED Final   Streptococcus species NOT DETECTED NOT DETECTED Final   Streptococcus agalactiae NOT DETECTED NOT DETECTED Final   Streptococcus pneumoniae NOT DETECTED  NOT DETECTED Final   Streptococcus pyogenes NOT DETECTED NOT DETECTED Final   A.calcoaceticus-baumannii NOT DETECTED NOT DETECTED Final   Bacteroides fragilis NOT DETECTED NOT DETECTED Final   Enterobacterales NOT DETECTED NOT DETECTED Final   Enterobacter cloacae complex NOT DETECTED NOT DETECTED Final   Escherichia coli NOT DETECTED NOT DETECTED Final   Klebsiella aerogenes NOT DETECTED NOT DETECTED Final   Klebsiella oxytoca NOT DETECTED NOT DETECTED Final   Klebsiella pneumoniae NOT DETECTED NOT DETECTED Final   Proteus species NOT DETECTED NOT DETECTED Final   Salmonella species NOT DETECTED NOT DETECTED Final   Serratia marcescens NOT DETECTED NOT DETECTED Final   Haemophilus influenzae NOT DETECTED NOT DETECTED Final   Neisseria meningitidis NOT DETECTED NOT DETECTED Final   Pseudomonas aeruginosa NOT DETECTED NOT DETECTED Final  Stenotrophomonas maltophilia NOT DETECTED NOT DETECTED Final   Candida albicans NOT DETECTED NOT DETECTED Final   Candida auris NOT DETECTED NOT DETECTED Final   Candida glabrata NOT DETECTED NOT DETECTED Final   Candida krusei NOT DETECTED NOT DETECTED Final   Candida parapsilosis NOT DETECTED NOT DETECTED Final   Candida tropicalis NOT DETECTED NOT DETECTED Final   Cryptococcus neoformans/gattii NOT DETECTED NOT DETECTED Final   Vancomycin resistance DETECTED (A) NOT DETECTED Final    Comment: CRITICAL RESULT CALLED TO, READ BACK BY AND VERIFIED WITH:  AMY THOMPSON AT 1001 11/27/20 SDR Performed at [facility], [address] [place], [place] [address]   CULTURE, BLOOD (ROUTINE X 2) w Reflex to ID Panel     Status: None   Collection Time: 11/27/20 10:35 PM   Specimen: BLOOD  Result Value Ref Range Status   Specimen Description BLOOD BLOOD LEFT HAND  Final   Special Requests   Final    BOTTLES DRAWN AEROBIC AND ANAEROBIC Blood Culture adequate volume   Culture   Final    NO GROWTH 5 DAYS Performed at [facility], [address] [place], [place] [address]    Report Status 12/02/2020 FINAL  Final  CULTURE, BLOOD (ROUTINE X 2) w Reflex to ID Panel     Status: None   Collection Time: 11/27/20 10:35 PM   Specimen: BLOOD  Result Value Ref Range Status   Specimen Description BLOOD BLOOD RIGHT HAND  Final   Special Requests   Final    BOTTLES DRAWN AEROBIC AND ANAEROBIC Blood Culture adequate volume   Culture   Final    NO GROWTH 5 DAYS Performed at [facility], [address] [place], [place] [address]    Report Status 12/02/2020 FINAL  Final     Labs: Basic Metabolic Panel: Recent Labs  Lab 11/27/20 0523 11/28/20 0534 11/29/20 0526 11/30/20 0516 12/01/20 0649 12/02/20 0520  NA 136 135 137 139 138 138  K 4.4 4.0 3.8 3.7 3.9 3.4*  CL 110 109 109 110 112* 110  CO2 21* 19* 21* 21* 24 22  GLUCOSE 95 84 90 90 85 84  BUN _0 CREATININE 1.22* 1.12* 1.08* 1.20* 1.23* 1.20*  CALCIUM 8.7* 8.4* 8.5* 8.3* 8.5* 8.5*  MG 1.8 1.8 1.8  --  1.7  --   PHOS 4.0 4.1 4.4  --  4.4  --    Liver Function Tests: Recent Labs  Lab 11/26/20 1614 11/27/20 0523  AST 33 28  ALT 26 23  ALKPHOS 126 112  BILITOT 0.6 0.7  PROT 7.4 6.7  ALBUMIN 2.6* 2.4*   No results for input(s): LIPASE, AMYLASE in the last 168 hours. No results for input(s): AMMONIA in the last 168 hours. CBC: Recent Labs  Lab 11/25/20 1922 11/26/20 1614 11/27/20 0523 11/28/20 0534 11/29/20 0526 11/30/20 0516 12/01/20 0649 12/02/20 0520  WBC 12.3* 8.4 7.4 7.6 5.6  6.1 4.3 4.6 4.9  NEUTROABS 9.8* 5.0 4.0 4.3 3.0  --   --   --   HGB 9.8* 9.2* 9.3* 8.7* 8.2*  8.4* 8.4* 8.6* 8.6*  HCT 28.3* 28.6* 28.7* 26.3* 25.3*  25.7* 26.1* 26.6* 25.5*  MCV 89.6 94.4 92.9 93.3 94.1  95 96.0 94.3 93.1  PLT 332 322 337 372 399  435 403* 450* 408*   Cardiac Enzymes: Recent Labs  Lab 11/26/20 1614  CKTOTAL 21*   BNP: BNP (last 3 results) No results for  input(s): BNP in the last 8760 hours.  ProBNP (last 3  results) No results for input(s): PROBNP in the last 8760 hours.  CBG: No results for input(s): GLUCAP in the last 168 hours.     Signed:  Desma Maxim MD.  [facility] 12/02/2020, 9:20 AM

## 2020-12-02 NOTE — TOC Progression Note (Signed)
Transition of Care [facility]) - Progression Note    Patient Details  Name: Melanie Bowen MRN: [id number] Date of Birth: [birth year]  Transition of Care [facility]) CM/SW Contact  Pete Pelt, RN Phone Number: 12/02/2020, 11:24 AM  Clinical Narrative:   TOC spoke to patient via phone re: discharge planning.  Patient aware that Pam from [facility] will see her in her hospital room to educate/set up IV antibiotics.  [facility] requests discharge of 3-4 pm, patient made aware and is amenable.  Discussed discharge home.  Patient has no concerns about getting to appointments or getting medications/supplies as needed and denies any other questions or concerns about returning home at this time.      Expected Discharge Plan: [place] Barriers to Discharge: Continued Medical Work up  Expected Discharge Plan and Services Expected Discharge Plan: [place]   Discharge Planning Services: CM Consult,Mobile Meals   Living arrangements for the past 2 months: Single Family Home Expected Discharge Date: 12/02/20               DME Arranged: N/A         HH Arranged: NA           Social Determinants of Health (SDOH) Interventions    Readmission Risk Interventions Readmission Risk Prevention Plan 12/02/2020 05/23/2020 11/08/2019  Transportation Screening Complete Complete Complete  Medication Review [employer] photographer) Complete Complete Complete  PCP or Specialist appointment within 3-5 days of discharge Complete (No Data) Not Complete  PCP/Specialist Appt Not Complete comments - - Her PCP is out for 2-3 weeks so other two providers are booked out. The receptionist will call her if there is a cancellation.  [place] or Home Care Consult Complete (No Data) Complete  SW Recovery Care/Counseling Consult Complete Complete Complete  Palliative Care Screening Not Applicable Complete Not Applicable  Skilled Nursing Facility Complete Complete Not Applicable  Some recent data might  be hidden

## 2020-12-03 ENCOUNTER — Other Ambulatory Visit: Payer: Self-pay

## 2020-12-03 ENCOUNTER — Emergency Department
Admission: EM | Admit: 2020-12-03 | Discharge: 2020-12-03 | Disposition: A | Discharge status: Home / Self Care | Payer: Medicare Other | Attending: Emergency Medicine | Admitting: Emergency Medicine

## 2020-12-03 DIAGNOSIS — M545 Low back pain, unspecified: Secondary | ICD-10-CM | POA: Insufficient documentation

## 2020-12-03 DIAGNOSIS — Z96653 Presence of artificial knee joint, bilateral: Secondary | ICD-10-CM | POA: Diagnosis not present

## 2020-12-03 DIAGNOSIS — Z79899 Other long term (current) drug therapy: Secondary | ICD-10-CM | POA: Insufficient documentation

## 2020-12-03 DIAGNOSIS — Z7951 Long term (current) use of inhaled steroids: Secondary | ICD-10-CM | POA: Insufficient documentation

## 2020-12-03 DIAGNOSIS — J449 Chronic obstructive pulmonary disease, unspecified: Secondary | ICD-10-CM | POA: Diagnosis not present

## 2020-12-03 DIAGNOSIS — G8929 Other chronic pain: Secondary | ICD-10-CM | POA: Diagnosis not present

## 2020-12-03 DIAGNOSIS — J45909 Unspecified asthma, uncomplicated: Secondary | ICD-10-CM | POA: Diagnosis not present

## 2020-12-03 DIAGNOSIS — Z87891 Personal history of nicotine dependence: Secondary | ICD-10-CM | POA: Insufficient documentation

## 2020-12-03 DIAGNOSIS — Z21 Asymptomatic human immunodeficiency virus [HIV] infection status: Secondary | ICD-10-CM | POA: Diagnosis not present

## 2020-12-03 DIAGNOSIS — M549 Dorsalgia, unspecified: Secondary | ICD-10-CM | POA: Diagnosis present

## 2020-12-03 DIAGNOSIS — I10 Essential (primary) hypertension: Secondary | ICD-10-CM | POA: Diagnosis not present

## 2020-12-03 MED ORDER — ORPHENADRINE CITRATE ER 100 MG PO TB12: 100.0000 mg | ORAL_TABLET | Freq: Two times a day (BID) | ORAL | 0 refills | Status: AC

## 2020-12-03 MED ORDER — ORPHENADRINE CITRATE 30 MG/ML IJ SOLN
60.0000 mg | Freq: Two times a day (BID) | INTRAMUSCULAR | Status: DC
  Administered 2020-12-03: 60 mg via INTRAMUSCULAR
  Filled 2020-12-03: qty 2

## 2020-12-03 MED ORDER — OXYCODONE HCL 5 MG PO CAPS: 5.0000 mg | ORAL_CAPSULE | ORAL | 0 refills | Status: AC | PRN

## 2020-12-03 MED ORDER — METHYLPREDNISOLONE 4 MG PO TBPK: ORAL_TABLET | ORAL | 0 refills | Status: AC

## 2020-12-03 MED ORDER — KETOROLAC TROMETHAMINE 60 MG/2ML IM SOLN
60.0000 mg | Freq: Once | INTRAMUSCULAR | Status: AC
  Administered 2020-12-03: 60 mg via INTRAMUSCULAR
  Filled 2020-12-03: qty 2

## 2020-12-03 NOTE — ED Triage Notes (Signed)
Pt comes with c/o lower back pain. Pt states chronic back pain but it hasn't been bothering her . Pt states normally she can get couple of shots and it feels better.  Pt states hx of back surgery.

## 2020-12-03 NOTE — ED Provider Notes (Signed)
[facility] Emergency Department Provider Note    ____________________________________________   Event Date/Time   First MD Initiated Contact with Patient 12/03/20 [phone]     (approximate)  I have reviewed the triage vital signs and the nursing notes.   HISTORY  Chief Complaint Back Pain    HPI Melanie Bowen is a 60 y.o. female patient presents with back pain with radicular component to bilateral lower extremities.  Patient denies bladder or bowel dysfunction.  Patient has a history of chronic back pain status post 2 lumbar surgeries with fusions.  Patient is followed by pain management.  Patient presents to ED this morning for pain relief.  Patient rates pain as a 10/10.  Patient described pain as "aching".  Patient states in the past she presents to this department and is given nonnarcotic injections with moderate relief.     Past Medical History:  Diagnosis Date  . Acute GI hemorrhage 12/2015   required transfusion  . Allergic rhinitis   . Arthritis    DDD  . Asthma   . Blepharitis   . Bronchitis   . Cigarette smoker   . Claustrophobia   . COPD (chronic obstructive pulmonary disease) ([place])   . Cough   . DDD (degenerative disc disease), lumbar 04/10/2014  . Depression   . Diverticulosis   . Ectopic pregnancy   . Eye irritation   . Genital herpes   . GERD (gastroesophageal reflux disease)   . Headache   . HIV (human immunodeficiency virus infection) ([place])   . Hypertension   . Paresthesia of hand, bilateral   . Shortness of breath dyspnea    at times due to asthma    Patient Active Problem List   Diagnosis Date Noted  . VRE (vancomycin resistant enterococcus) culture positive 11/28/2020  . Bacteremia 11/26/2020  . Pressure injury of sacral region, stage 3 ([place]) 10/30/2020  . Chronic liver failure without hepatic coma ([place]) 10/30/2020  . Weakness 06/11/2020  . Fever 06/09/2020  . Unspecified atrial fibrillation ([place]) 06/09/2020   . Hypokalemia 06/08/2020  . Pressure injury of skin 05/08/2020  . Goals of care, counseling/discussion   . Dyspnea   . Acute renal failure (ARF) ([place]) 04/16/2020  . Severe sepsis with septic shock ([place]) 04/16/2020  . Hypotension 04/16/2020  . Diverticulosis 04/16/2020  . Acute respiratory distress   . DNR (do not resuscitate) discussion   . Palliative care by specialist   . Abscess of sigmoid colon due to diverticulitis 04/14/2020  . Multifocal pneumonia 11/06/2019  . AKI (acute kidney injury) ([place]) 11/06/2019  . Leukocytosis 11/06/2019  . Diverticulitis of large intestine with abscess without bleeding 01/17/2019  . Morbid (severe) obesity due to excess calories ([place]) 11/26/2018  . Violation of controlled substance agreement 12/05/2017  . Positive urine drug screen (+cocaine) 12/05/2017  . Cervicalgia 12/05/2017  . Drug-seeking behavior 12/05/2017  . Community acquired pneumonia 11/28/2016  . Pneumonia 11/28/2016  . Allergic rhinitis 05/27/2016  . Tobacco abuse counseling 02/11/2016  . Symptomatic anemia   . Lower GI bleed   . Anemia 01/16/2016  . GI bleed 01/10/2016  . GERD (gastroesophageal reflux disease) 01/10/2016  . HTN (hypertension) 01/10/2016  . Depression 01/10/2016  . HIV (human immunodeficiency virus infection) ([place]) 01/10/2016  . Lumbar pseudoarthrosis 10/31/2015  . Chronic cough   . Cough 06/19/2015  . DDD (degenerative disc disease), lumbar 12/31/2014  . Degenerative disc disease, lumbar 12/31/2014  . Asthma, chronic 11/12/2014    Past Surgical History:  Procedure Laterality Date  . BACK SURGERY  12/2014   lumbar lam. Dr. Deri Fuelling  . BACK SURGERY  12/25/2012   Removal lumbosubarachnoid shunt system, L5-S1 TF ESI, Dr. Virl Axe Minchew  . back surgery may 2016     lumbar   . BUNIONECTOMY Bilateral    feet  . COLONOSCOPY  12/06/2014   rescheduled from 11/01/2014 due to poor prep  . DIAGNOSTIC LAPAROSCOPY    . DIALYSIS/PERMA CATHETER INSERTION N/A  04/25/2020   Procedure: DIALYSIS/PERMA CATHETER INSERTION;  Surgeon: Algernon Huxley, MD;  Location: [facility];  Service: Cardiovascular;  Laterality: N/A;  . DIALYSIS/PERMA CATHETER REMOVAL N/A 05/23/2020   Procedure: DIALYSIS/PERMA CATHETER REMOVAL;  Surgeon: Algernon Huxley, MD;  Location: [place];  Service: Cardiovascular;  Laterality: N/A;  . DISTAL FEMUR OSTEOTOMY Right    Dr. Earnestine Leys  . ESOPHAGOGASTRODUODENOSCOPY Left 01/11/2016   Procedure: ESOPHAGOGASTRODUODENOSCOPY (EGD);  Surgeon: Hulen Luster, MD;  Location: [facility];  Service: Endoscopy;  Laterality: Left;  . Finger fracture Right    5th finger  . FRACTURE SURGERY Right 01/25/2014   closed reduction & percutaneous pinning of 5th digit  . HAND SURGERY Bilateral    carpal tunnel  . JOINT REPLACEMENT Bilateral    knees  . KNEE SURGERY Bilateral 2003,2007   total Joint  . LAPAROSCOPY FOR ECTOPIC PREGNANCY    . PERIPHERAL VASCULAR CATHETERIZATION N/A 01/12/2016   Procedure: Visceral Angiography;  Surgeon: Algernon Huxley, MD;  Location: [facility];  Service: Cardiovascular;  Laterality: N/A;  . PERIPHERAL VASCULAR CATHETERIZATION N/A 01/12/2016   Procedure: Visceral Artery Intervention;  Surgeon: Algernon Huxley, MD;  Location: [facility];  Service: Cardiovascular;  Laterality: N/A;  . REVISION TOTAL KNEE ARTHROPLASTY Right 12/31/2002   Dr. Marry Guan  . TEE WITHOUT CARDIOVERSION N/A 12/01/2020   Procedure: TRANSESOPHAGEAL ECHOCARDIOGRAM (TEE);  Surgeon: Teodoro Spray, MD;  Location: [facility];  Service: Cardiovascular;  Laterality: N/A;  . TUBAL LIGATION    . VIDEO BRONCHOSCOPY N/A 06/30/2015   Procedure: VIDEO BRONCHOSCOPY WITHOUT FLUORO;  Surgeon: Vilinda Boehringer, MD;  Location: [facility];  Service: Cardiopulmonary;  Laterality: N/A;    Prior to Admission medications   Medication Sig Start Date End Date Taking? Authorizing Provider  methylPREDNISolone (MEDROL DOSEPAK) 4 MG TBPK tablet Take  Tapered dose as directed 12/03/20  Yes Sable Feil, PA-C  orphenadrine (NORFLEX) 100 MG tablet Take 1 tablet (100 mg total) by mouth 2 (two) times daily. 12/03/20  Yes Sable Feil, PA-C  oxycodone (OXY-IR) 5 MG capsule Take 1 capsule (5 mg total) by mouth every 4 (four) hours as needed. 12/03/20  Yes Sable Feil, PA-C  albuterol (VENTOLIN HFA) 108 (90 Base) MCG/ACT inhaler Inhale 2 puffs into the lungs every 6 (six) hours as needed for wheezing or shortness of breath. 05/21/19   Laban Emperor, PA-C  ampicillin IVPB Inject 12 g into the vein continuous. Infuse ampicillin 12gm daily as continuous infusion over 24h.  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 12/01/20 01/07/21  Wouk, Ailene Rud, MD  ascorbic acid (VITAMIN C) 500 MG tablet Take 500 mg by mouth 2 (two) times daily. Patient not  taking: Reported on 11/27/2020    [provider]  atorvastatin (LIPITOR) 20 MG tablet Take 20 mg by mouth daily.    [provider]  bictegravir-emtricitabine-tenofovir AF (BIKTARVY) 50-200-25 MG TABS tablet Take 1 tablet by mouth daily.    [provider]  cefTRIAXone (ROCEPHIN) IVPB Inject 2 g into the vein every 12 (twelve) hours. 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 12/01/20 01/07/21  Wouk, Ailene Rud, MD  DEXILANT 60 MG capsule Take  60 mg by mouth daily.  04/25/19   [provider]  famotidine (PEPCID) 20 MG tablet Take 20 mg by mouth daily.  05/08/19   [provider]  FLUoxetine (PROZAC) 20 MG capsule Take 20 mg by mouth daily. Patient not taking: Reported on 11/27/2020    [provider]  gabapentin (NEURONTIN) 600 MG tablet Take 600 mg by mouth 3 (three) times daily as needed.    [provider]  Melatonin 10 MG TABS Take 1 tablet by mouth daily.    [provider]  Multiple Vitamin (MULTIVITAMIN WITH MINERALS) TABS tablet Take 1 tablet by mouth daily.    [provider]  oxyCODONE-acetaminophen (PERCOCET/ROXICET) 5-325 MG tablet Take 2 tablets by mouth every 4 (four) hours as needed for severe pain.    [provider]  Grant Ruts INHUB 250-50 MCG/DOSE AEPB Inhale 1 puff into the lungs 2 (two) times daily. 11/05/19   [provider]    Allergies Acetaminophen, Ibuprofen, Gabapentin, Morphine, Morphine and related, and Zanaflex  [tizanidine]  Family History  Problem Relation Age of Onset  . Breast cancer Sister     Social History Social History   Tobacco Use  . Smoking status: Former Smoker    Packs/day: 0.25    Years: 38.00    Pack years: 9.50    Types: Cigarettes  . Smokeless tobacco: Never Used  . Tobacco comment: 1 cig daily  Vaping Use  . Vaping Use: Never used  Substance Use Topics  . Alcohol use: Not Currently    Alcohol/week: 5.0 standard drinks    Types: 5 Glasses of wine per week    Comment: occ  . Drug use: Not Currently    Review of Systems Constitutional: No fever/chills Eyes: No visual changes. ENT: No sore throat. Cardiovascular: Denies chest pain. Respiratory: Denies shortness of breath. Gastrointestinal: No abdominal pain.  No nausea, no vomiting.  No diarrhea.  No constipation. Genitourinary: Negative for dysuria. Musculoskeletal: Chronic back pain, Skin: Negative for rash. Neurological: Negative for headaches,  focal weakness or numbness. Psychiatric:  Depression Endocrine:  HIV and hypertension Allergic/Immunilogical: Acetaminophen, ibuprofen, gabapentin, morphine, and Zanaflex. ____________________________________________   PHYSICAL EXAM:  VITAL SIGNS: ED Triage Vitals  Enc Vitals Group     BP 12/03/20 0709 137/71     Pulse Rate 12/03/20 0709 87     Resp 12/03/20 0709 18     Temp 12/03/20 0709 97.6 F (36.4 C)     Temp src --      SpO2 12/03/20 0709 100 %     Weight 12/03/20 0709 202 lb 13.2 oz (92 kg)     Height 12/03/20 0709 '5\' 6"'$  (1.676 m)     Head Circumference --  Peak Flow --      Pain Score 12/03/20 0707 10     Pain Loc --      Pain Edu? --      Excl. in [place]? --    Constitutional: Alert and oriented. Well appearing and in no acute distress. Neck: No cervical spine tenderness to palpation. Hematological/Lymphatic/Immunilogical: No cervical lymphadenopathy. Cardiovascular: Normal rate, regular rhythm. Grossly normal heart sounds.  Good peripheral circulation. Respiratory: Normal respiratory effort.  No retractions. Lungs CTAB. Gastrointestinal: Soft and nontender. No distention. No abdominal bruits. No CVA tenderness. Genitourinary: Deferred Musculoskeletal: No obvious lumbar spine deformity.  Surgical scars consistent with history.  Patient has decreased range of motion with flexion.  Patient has full Nikkel range of motion in upper and lower extremities.  Patient is able to ambulate.  Moderate guarding palpation L3-S1. Neurologic:  Normal speech and language. No gross focal neurologic deficits are appreciated. No gait instability. Skin:  Skin is warm, dry and intact. No rash noted. Psychiatric: Mood and affect are normal. Speech and behavior are normal.  ____________________________________________   LABS (all labs ordered are listed, but only abnormal results are displayed)  Labs Reviewed - No data to  display ____________________________________________  EKG   ____________________________________________  RADIOLOGY I, Sable Feil, personally viewed and evaluated these images (plain radiographs) as part of my medical decision making, as well as reviewing the written report by the radiologist.  ED MD interpretation:    Official radiology report(s): [place] EKG Site Rite  Result Date: 12/02/2020 If Site Rite image not attached, placement could not be confirmed due to current cardiac rhythm.   ____________________________________________   PROCEDURES  Procedure(s) performed (including Critical Care):  Procedures   ____________________________________________   INITIAL IMPRESSION / ASSESSMENT AND PLAN / ED COURSE  As part of my medical decision making, I reviewed the following data within the [facility]         Patient presents to ED for chronic back pain.  Patient given IM Norflex and Toradol.  Patient given prescription for Norflex, oxycodone, and Medrol Dosepak.  Patient advised follow-up PCP.      ____________________________________________   FINAL CLINICAL IMPRESSION(S) / ED DIAGNOSES  Final diagnoses:  Chronic bilateral low back pain without sciatica     ED Discharge Orders         Ordered    oxycodone (OXY-IR) 5 MG capsule  Every 4 hours PRN        12/03/20 0726    orphenadrine (NORFLEX) 100 MG tablet  2 times daily        12/03/20 0754    methylPREDNISolone (MEDROL DOSEPAK) 4 MG TBPK tablet        12/03/20 [id number]          *Please note:  NEYDA BIER was evaluated in Emergency Department on 12/03/2020 for the symptoms described in the history of present illness. She was evaluated in the context of the global COVID-19 pandemic, which necessitated consideration that the patient might be at risk for infection with the SARS-CoV-2 virus that causes COVID-19. Institutional protocols and algorithms that pertain to the evaluation of  patients at risk for COVID-19 are in a state of rapid change based on information released by regulatory bodies including the CDC and federal and state organizations. These policies and algorithms were followed during the patient's care in the ED.  Some ED evaluations and interventions may be delayed as a result of limited staffing during and the pandemic.*   Note:  This document was prepared using Dragon voice recognition software and may include unintentional dictation errors.    Sable Feil, PA-C 12/03/20 0756    Carrie Mew, MD 12/05/20 [place]

## 2020-12-03 NOTE — ED Notes (Signed)
See triage note  Presents with lower back pain  States pain is constant states pain is non radiating   Was just discharged   Felt a little pain yesterday  But today was unable to ambulate d/t pain

## 2020-12-08 ENCOUNTER — Telehealth: Payer: Self-pay

## 2020-12-10 NOTE — Telephone Encounter (Signed)
No message, patient discharged from clinic

## 2020-12-11 ENCOUNTER — Other Ambulatory Visit: Payer: Self-pay

## 2020-12-11 ENCOUNTER — Emergency Department
Admission: EM | Admit: 2020-12-11 | Discharge: 2020-12-11 | Disposition: A | Discharge status: Home / Self Care | Payer: Medicare Other | Attending: Emergency Medicine | Admitting: Emergency Medicine

## 2020-12-11 DIAGNOSIS — J449 Chronic obstructive pulmonary disease, unspecified: Secondary | ICD-10-CM | POA: Diagnosis not present

## 2020-12-11 DIAGNOSIS — J45909 Unspecified asthma, uncomplicated: Secondary | ICD-10-CM | POA: Insufficient documentation

## 2020-12-11 DIAGNOSIS — Z79899 Other long term (current) drug therapy: Secondary | ICD-10-CM | POA: Diagnosis not present

## 2020-12-11 DIAGNOSIS — Z87891 Personal history of nicotine dependence: Secondary | ICD-10-CM | POA: Insufficient documentation

## 2020-12-11 DIAGNOSIS — G8929 Other chronic pain: Secondary | ICD-10-CM | POA: Diagnosis not present

## 2020-12-11 DIAGNOSIS — Z21 Asymptomatic human immunodeficiency virus [HIV] infection status: Secondary | ICD-10-CM | POA: Insufficient documentation

## 2020-12-11 DIAGNOSIS — Z96653 Presence of artificial knee joint, bilateral: Secondary | ICD-10-CM | POA: Diagnosis not present

## 2020-12-11 DIAGNOSIS — I10 Essential (primary) hypertension: Secondary | ICD-10-CM | POA: Diagnosis not present

## 2020-12-11 DIAGNOSIS — M549 Dorsalgia, unspecified: Secondary | ICD-10-CM | POA: Diagnosis present

## 2020-12-11 DIAGNOSIS — M5442 Lumbago with sciatica, left side: Secondary | ICD-10-CM | POA: Insufficient documentation

## 2020-12-11 DIAGNOSIS — Z7951 Long term (current) use of inhaled steroids: Secondary | ICD-10-CM | POA: Insufficient documentation

## 2020-12-11 MED ORDER — CYCLOBENZAPRINE HCL 5 MG PO TABS: 5.0000 mg | ORAL_TABLET | Freq: Three times a day (TID) | ORAL | 0 refills | Status: AC | PRN

## 2020-12-11 MED ORDER — ORPHENADRINE CITRATE 30 MG/ML IJ SOLN
60.0000 mg | INTRAMUSCULAR | Status: AC
  Administered 2020-12-11: 60 mg via INTRAMUSCULAR
  Filled 2020-12-11: qty 2

## 2020-12-11 MED ORDER — KETOROLAC TROMETHAMINE 30 MG/ML IJ SOLN
30.0000 mg | Freq: Once | INTRAMUSCULAR | Status: AC
  Administered 2020-12-11: 30 mg via INTRAMUSCULAR
  Filled 2020-12-11: qty 1

## 2020-12-11 MED ORDER — OXYCODONE-ACETAMINOPHEN 5-325 MG PO TABS: 1.0000 | ORAL_TABLET | Freq: Three times a day (TID) | ORAL | 0 refills | Status: AC | PRN

## 2020-12-11 NOTE — Discharge Instructions (Signed)
Your exam is normal at this time.  A small prescription for narcotic pain medicine is provided today, however, note that with future ED visits, you MAY NOT be offered narcotic medications.  Take prescription medicines as prescribed.  Follow-up with your PCP for ongoing back pain management.  Follow-up with pain management as planned next month.

## 2020-12-11 NOTE — ED Provider Notes (Signed)
[facility] Emergency Department Provider Note ____________________________________________  Time seen: 1555  I have reviewed the triage vital signs and the nursing notes.  HISTORY  Chief Complaint  Back Pain  HPI Melanie Bowen is a 60 y.o. female presents herself to the ED for evaluation of chronic back pain.   Patient presents with a request for "pain shots", for her back pain.  She reports previously being treated with injectable anti-inflammatories and reports benefit.  She reports she is in the process of being established with a local pain management provider.  The patient claims that her appointment is scheduled for next month.  She denies any interim falls, trauma, distal paresthesias, foot drop, or saddle anesthesias.  Past Medical History:  Diagnosis Date  . Acute GI hemorrhage 12/2015   required transfusion  . Allergic rhinitis   . Arthritis    DDD  . Asthma   . Blepharitis   . Bronchitis   . Cigarette smoker   . Claustrophobia   . COPD (chronic obstructive pulmonary disease) ([place])   . Cough   . DDD (degenerative disc disease), lumbar 04/10/2014  . Depression   . Diverticulosis   . Ectopic pregnancy   . Eye irritation   . Genital herpes   . GERD (gastroesophageal reflux disease)   . Headache   . HIV (human immunodeficiency virus infection) ([place])   . Hypertension   . Paresthesia of hand, bilateral   . Shortness of breath dyspnea    at times due to asthma    Patient Active Problem List   Diagnosis Date Noted  . VRE (vancomycin resistant enterococcus) culture positive 11/28/2020  . Bacteremia 11/26/2020  . Pressure injury of sacral region, stage 3 ([place]) 10/30/2020  . Chronic liver failure without hepatic coma ([place]) 10/30/2020  . Weakness 06/11/2020  . Fever 06/09/2020  . Unspecified atrial fibrillation ([place]) 06/09/2020  . Hypokalemia 06/08/2020  . Pressure injury of skin 05/08/2020  . Goals of care, counseling/discussion   .  Dyspnea   . Acute renal failure (ARF) ([place]) 04/16/2020  . Severe sepsis with septic shock ([place]) 04/16/2020  . Hypotension 04/16/2020  . Diverticulosis 04/16/2020  . Acute respiratory distress   . DNR (do not resuscitate) discussion   . Palliative care by specialist   . Abscess of sigmoid colon due to diverticulitis 04/14/2020  . Multifocal pneumonia 11/06/2019  . AKI (acute kidney injury) ([place]) 11/06/2019  . Leukocytosis 11/06/2019  . Diverticulitis of large intestine with abscess without bleeding 01/17/2019  . Morbid (severe) obesity due to excess calories ([place]) 11/26/2018  . Violation of controlled substance agreement 12/05/2017  . Positive urine drug screen (+cocaine) 12/05/2017  . Cervicalgia 12/05/2017  . Drug-seeking behavior 12/05/2017  . Community acquired pneumonia 11/28/2016  . Pneumonia 11/28/2016  . Allergic rhinitis 05/27/2016  . Tobacco abuse counseling 02/11/2016  . Symptomatic anemia   . Lower GI bleed   . Anemia 01/16/2016  . GI bleed 01/10/2016  . GERD (gastroesophageal reflux disease) 01/10/2016  . HTN (hypertension) 01/10/2016  . Depression 01/10/2016  . HIV (human immunodeficiency virus infection) ([place]) 01/10/2016  . Lumbar pseudoarthrosis 10/31/2015  . Chronic cough   . Cough 06/19/2015  . DDD (degenerative disc disease), lumbar 12/31/2014  . Degenerative disc disease, lumbar 12/31/2014  . Asthma, chronic 11/12/2014    Past Surgical History:  Procedure Laterality Date  . BACK SURGERY  12/2014   lumbar lam. Dr. Deri Fuelling  . BACK SURGERY  12/25/2012   Removal lumbosubarachnoid shunt  system, L5-S1 TF ESI, Dr. Virl Axe Minchew  . back surgery may 2016     lumbar   . BUNIONECTOMY Bilateral    feet  . COLONOSCOPY  12/06/2014   rescheduled from 11/01/2014 due to poor prep  . DIAGNOSTIC LAPAROSCOPY    . DIALYSIS/PERMA CATHETER INSERTION N/A 04/25/2020   Procedure: DIALYSIS/PERMA CATHETER INSERTION;  Surgeon: Algernon Huxley, MD;  Location: [place]  CV [facility];  Service: Cardiovascular;  Laterality: N/A;  . DIALYSIS/PERMA CATHETER REMOVAL N/A 05/23/2020   Procedure: DIALYSIS/PERMA CATHETER REMOVAL;  Surgeon: Algernon Huxley, MD;  Location: [place] [facility];  Service: Cardiovascular;  Laterality: N/A;  . DISTAL FEMUR OSTEOTOMY Right    Dr. Earnestine Leys  . ESOPHAGOGASTRODUODENOSCOPY Left 01/11/2016   Procedure: ESOPHAGOGASTRODUODENOSCOPY (EGD);  Surgeon: Hulen Luster, MD;  Location: [facility] ENDOSCOPY;  Service: Endoscopy;  Laterality: Left;  . Finger fracture Right    5th finger  . FRACTURE SURGERY Right 01/25/2014   closed reduction & percutaneous pinning of 5th digit  . HAND SURGERY Bilateral    carpal tunnel  . JOINT REPLACEMENT Bilateral    knees  . KNEE SURGERY Bilateral 2003,2007   total Joint  . LAPAROSCOPY FOR ECTOPIC PREGNANCY    . PERIPHERAL VASCULAR CATHETERIZATION N/A 01/12/2016   Procedure: Visceral Angiography;  Surgeon: Algernon Huxley, MD;  Location: [place] [facility];  Service: Cardiovascular;  Laterality: N/A;  . PERIPHERAL VASCULAR CATHETERIZATION N/A 01/12/2016   Procedure: Visceral Artery Intervention;  Surgeon: Algernon Huxley, MD;  Location: [place] [facility];  Service: Cardiovascular;  Laterality: N/A;  . REVISION TOTAL KNEE ARTHROPLASTY Right 12/31/2002   Dr. Marry Guan  . TEE WITHOUT CARDIOVERSION N/A 12/01/2020   Procedure: TRANSESOPHAGEAL ECHOCARDIOGRAM (TEE);  Surgeon: Teodoro Spray, MD;  Location: [facility] ORS;  Service: Cardiovascular;  Laterality: N/A;  . TUBAL LIGATION    . VIDEO BRONCHOSCOPY N/A 06/30/2015   Procedure: VIDEO BRONCHOSCOPY WITHOUT FLUORO;  Surgeon: Vilinda Boehringer, MD;  Location: [facility] ORS;  Service: Cardiopulmonary;  Laterality: N/A;    Prior to Admission medications   Medication Sig Start Date End Date Taking? Authorizing Provider  cyclobenzaprine (FLEXERIL) 5 MG tablet Take 1 tablet (5 mg total) by mouth 3 (three) times daily as needed. 12/11/20  Yes Anwen Cannedy, Dannielle Karvonen, PA-C  oxyCODONE-acetaminophen  (PERCOCET) 5-325 MG tablet Take 1 tablet by mouth 3 (three) times daily as needed for up to 5 days for severe pain. 12/11/20 12/16/20 Yes Maverik Foot, Dannielle Karvonen, PA-C  albuterol (VENTOLIN HFA) 108 (90 Base) MCG/ACT inhaler Inhale 2 puffs into the lungs every 6 (six) hours as needed for wheezing or shortness of breath. 05/21/19   Laban Emperor, PA-C  ampicillin IVPB Inject 12 g into the vein continuous. Infuse ampicillin 12gm daily as continuous infusion over 24h.  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 12/01/20 01/07/21  Wouk, Ailene Rud, MD  ascorbic acid (VITAMIN C) 500 MG tablet Take 500 mg by mouth 2 (two) times daily. Patient not taking: Reported on 11/27/2020    [provider]  atorvastatin (LIPITOR) 20 MG tablet Take 20 mg by mouth daily.    [provider]  bictegravir-emtricitabine-tenofovir AF (BIKTARVY) 50-200-25 MG TABS tablet Take 1  tablet by mouth daily.    [provider]  cefTRIAXone (ROCEPHIN) IVPB Inject 2 g into the vein every 12 (twelve) hours. 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 12/01/20 01/07/21  Wouk, Ailene Rud, MD  DEXILANT 60 MG capsule Take 60 mg by mouth daily.  04/25/19   [provider]  famotidine (PEPCID) 20 MG tablet Take 20 mg by mouth daily.  05/08/19   [provider]   FLUoxetine (PROZAC) 20 MG capsule Take 20 mg by mouth daily. Patient not taking: Reported on 11/27/2020    [provider]  gabapentin (NEURONTIN) 600 MG tablet Take 600 mg by mouth 3 (three) times daily as needed.    [provider]  Melatonin 10 MG TABS Take 1 tablet by mouth daily.    [provider]  methylPREDNISolone (MEDROL DOSEPAK) 4 MG TBPK tablet Take Tapered dose as directed 12/03/20   Sable Feil, PA-C  Multiple Vitamin (MULTIVITAMIN WITH MINERALS) TABS tablet Take 1 tablet by mouth daily.    [provider]  orphenadrine (NORFLEX) 100 MG tablet Take 1 tablet (100 mg total) by mouth 2 (two) times daily. 12/03/20   Sable Feil, PA-C  oxycodone (OXY-IR) 5 MG capsule Take 1 capsule (5 mg total) by mouth every 4 (four) hours as needed. 12/03/20   Sable Feil, PA-C  WIXELA INHUB 250-50 MCG/DOSE AEPB Inhale 1 puff into the lungs 2 (two) times daily. 11/05/19   [provider]    Allergies Acetaminophen, Ibuprofen, Gabapentin, Morphine, Morphine and related, and Zanaflex  [tizanidine]  Family History  Problem Relation Age of Onset  . Breast cancer Sister     Social History Social History   Tobacco Use  . Smoking status: Former Smoker    Packs/day: 0.25    Years: 38.00    Pack years: 9.50    Types: Cigarettes  . Smokeless tobacco: Never Used  . Tobacco comment: 1 cig daily  Vaping Use  . Vaping Use: Never used  Substance Use Topics  . Alcohol use: Not Currently    Alcohol/week: 5.0 standard drinks    Types: 5 Glasses of wine per week    Comment: occ  . Drug use: Not Currently    Review of Systems  Constitutional: Negative for fever. Cardiovascular: Negative for chest pain. Respiratory: Negative for shortness of breath. Gastrointestinal: Negative for abdominal pain, vomiting and diarrhea. Genitourinary: Negative for dysuria. Musculoskeletal: Positive for back pain. Skin: Negative for rash. Neurological: Negative for  headaches, focal weakness or numbness. ____________________________________________  PHYSICAL EXAM:  VITAL SIGNS: ED Triage Vitals  Enc Vitals Group     BP 12/11/20 1516 (!) 164/59     Pulse Rate 12/11/20 1516 62     Resp 12/11/20 1516 18     Temp 12/11/20 1516 98.3 F (36.8 C)     Temp Source 12/11/20 1516 Oral     SpO2 12/11/20 1516 97 %     Weight 12/11/20 1517 204 lb (92.5 kg)     Height 12/11/20 1517 '5\' 6"'$  (1.676 m)     Head Circumference --      Peak Flow --      Pain Score 12/11/20 1517 9  Pain Loc --      Pain Edu? --      Excl. in [place]? --     Constitutional: Alert and oriented. Well appearing and in no distress. Head: Normocephalic and atraumatic. Eyes: Conjunctivae are normal. Normal extraocular movements Cardiovascular: Normal rate, regular rhythm. Normal distal pulses. Respiratory: Normal respiratory effort. No wheezes/rales/rhonchi. Gastrointestinal: Soft and nontender. No distention.  Colostomy present. Musculoskeletal: Normal spine alignment without midline tenderness, spasm, deformity, or step-off.  Nontender with normal range of motion in all extremities.  Neurologic: Cranial nerves II to XII grossly intact.  Normal LE DTRs bilaterally.  Normal gait without ataxia. Normal speech and language. No gross focal neurologic deficits are appreciated. Skin:  Skin is warm, dry and intact. No rash noted. Psychiatric: Mood and affect are normal. Patient exhibits appropriate insight and judgment. ____________________________________________  PROCEDURES  Toradol 30 mg IM Norflex 60 mg IM  Procedures ____________________________________________  INITIAL IMPRESSION / ASSESSMENT AND PLAN / ED COURSE  Patient with chronic low back pain, presents to the ED for evaluation and management of flare of her chronic pain patient denies any interim trauma or fall patient denies any bladder or bowel incontinence, foot drop, saddle anesthesia.  Her exam is benign and stable at  this time.  Patient be treated with injectable medicine in the ED.  Prescription for Flexeril and a small prescription for Percocet provided at this time patient follow with primary provider for ongoing symptoms.   Melanie Bowen was evaluated in Emergency Department on 12/11/2020 for the symptoms described in the history of present illness. She was evaluated in the context of the global COVID-19 pandemic, which necessitated consideration that the patient might be at risk for infection with the SARS-CoV-2 virus that causes COVID-19. Institutional protocols and algorithms that pertain to the evaluation of patients at risk for COVID-19 are in a state of rapid change based on information released by regulatory bodies including the CDC and federal and state organizations. These policies and algorithms were followed during the patient's care in the ED.  I reviewed the patient's prescription history over the last 12 months in the multi-state controlled substances database(s) that includes [place], [place], [place], [place], [place], [place], [place], [place], [place], [place], [place], [place], [place], and [place].  Results were notable for recent RX noted.  ____________________________________________  FINAL CLINICAL IMPRESSION(S) / ED DIAGNOSES  Final diagnoses:  Chronic bilateral low back pain with left-sided sciatica      Melvenia Needles, PA-C 12/11/20 1640    Carrie Mew, MD 12/12/20 [id number]

## 2020-12-11 NOTE — ED Triage Notes (Signed)
Pt to ER via POV with complaints of lower back pain. Pt reports chronic back pain. States she typically receives a shot for pain and the pain is relieved.

## 2020-12-12 ENCOUNTER — Ambulatory Visit
Admission: RE | Admit: 2020-12-12 | Discharge: 2020-12-12 | Disposition: A | Discharge status: Home / Self Care | Payer: Medicare Other | Source: Ambulatory Visit | Attending: Family Medicine | Admitting: Family Medicine

## 2020-12-12 DIAGNOSIS — Z1231 Encounter for screening mammogram for malignant neoplasm of breast: Secondary | ICD-10-CM | POA: Insufficient documentation

## 2020-12-16 ENCOUNTER — Telehealth: Payer: Self-pay

## 2020-12-16 NOTE — Telephone Encounter (Signed)
Patient called to confirm appt and I explained that we need to change her appt as Dr R is out of office until 01/23/21. Patient stated she talked to [facility] and they said her labs are great so she wants PICC removed earlier than her 01/07/21 date. She has surgery to remove colostomy bag next Friday and they will not do it if bacteria is present or she is on PICC. I have advised her to talk to Dr [name] and sent secure chat to him to advise her and he responded stating he will review her case. Patient will wait for his call.

## 2020-12-17 ENCOUNTER — Telehealth: Payer: Self-pay | Admitting: Infectious Diseases

## 2020-12-17 NOTE — Telephone Encounter (Signed)
Regarding message from yesterday.  Below is Dr. Gwenevere Ghazi recommendations.  Patient needs to stay on IV antibiotics until the stop date.  Cannot move up her surgery.   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  She is currently on ampicillin.  We will add ceftriaxone as TEE show shows possible small aortic valve vegetation. She will need 6 weeks of IV antibiotics until 01/07/21 Repeat blood culture from 11/27/2020 was negative.  She will get a PICC line with double-lumen today.

## 2020-12-25 ENCOUNTER — Inpatient Hospital Stay: Payer: Medicare Other | Admitting: Infectious Diseases

## 2020-12-26 ENCOUNTER — Telehealth: Payer: Self-pay | Admitting: Infectious Diseases

## 2020-12-26 NOTE — Telephone Encounter (Signed)
Updated status in demographics and will inform Dr. Delaine Lame.

## 2020-12-26 NOTE — Telephone Encounter (Signed)
Called pt as she missed her apptment with me 2 days ago at [facility].  Called her daughter as well. Pt passed away on 12/26/2022 in her sleep

## 2020-12-28 DEATH — deceased
